# Patient Record
Sex: Female | Born: 1950 | Race: Black or African American | Hispanic: No | Marital: Single | Smoking: Former smoker
Health system: Southern US, Community
[De-identification: ages and names within clinical notes are randomized; demographics above are authoritative.]

## PROBLEM LIST (undated history)

## (undated) DIAGNOSIS — J189 Pneumonia, unspecified organism: Secondary | ICD-10-CM

## (undated) DIAGNOSIS — Z9119 Patient's noncompliance with other medical treatment and regimen: Secondary | ICD-10-CM

## (undated) DIAGNOSIS — T829XXA Unspecified complication of cardiac and vascular prosthetic device, implant and graft, initial encounter: Secondary | ICD-10-CM

## (undated) DIAGNOSIS — I10 Essential (primary) hypertension: Secondary | ICD-10-CM

## (undated) DIAGNOSIS — I5020 Unspecified systolic (congestive) heart failure: Secondary | ICD-10-CM

## (undated) DIAGNOSIS — Z91199 Patient's noncompliance with other medical treatment and regimen due to unspecified reason: Secondary | ICD-10-CM

## (undated) DIAGNOSIS — Z950 Presence of cardiac pacemaker: Secondary | ICD-10-CM

## (undated) DIAGNOSIS — Z9289 Personal history of other medical treatment: Secondary | ICD-10-CM

## (undated) DIAGNOSIS — J42 Unspecified chronic bronchitis: Secondary | ICD-10-CM

## (undated) DIAGNOSIS — Z992 Dependence on renal dialysis: Secondary | ICD-10-CM

## (undated) DIAGNOSIS — I8500 Esophageal varices without bleeding: Secondary | ICD-10-CM

## (undated) DIAGNOSIS — K861 Other chronic pancreatitis: Secondary | ICD-10-CM

## (undated) DIAGNOSIS — K7460 Unspecified cirrhosis of liver: Secondary | ICD-10-CM

## (undated) DIAGNOSIS — I2510 Atherosclerotic heart disease of native coronary artery without angina pectoris: Secondary | ICD-10-CM

## (undated) DIAGNOSIS — D509 Iron deficiency anemia, unspecified: Secondary | ICD-10-CM

## (undated) DIAGNOSIS — I255 Ischemic cardiomyopathy: Secondary | ICD-10-CM

## (undated) DIAGNOSIS — I509 Heart failure, unspecified: Secondary | ICD-10-CM

## (undated) DIAGNOSIS — N186 End stage renal disease: Secondary | ICD-10-CM

## (undated) DIAGNOSIS — I214 Non-ST elevation (NSTEMI) myocardial infarction: Secondary | ICD-10-CM

## (undated) DIAGNOSIS — Z95810 Presence of automatic (implantable) cardiac defibrillator: Secondary | ICD-10-CM

## (undated) HISTORY — DX: Ischemic cardiomyopathy: I25.5

## (undated) HISTORY — PX: PERITONEAL CATHETER REMOVAL: SUR1106

## (undated) HISTORY — PX: DILATION AND CURETTAGE OF UTERUS: SHX78

## (undated) HISTORY — PX: CARDIAC CATHETERIZATION: SHX172

## (undated) HISTORY — PX: APPENDECTOMY: SHX54

## (undated) HISTORY — DX: Atherosclerotic heart disease of native coronary artery without angina pectoris: I25.10

## (undated) HISTORY — DX: Patient's noncompliance with other medical treatment and regimen: Z91.19

## (undated) HISTORY — DX: Patient's noncompliance with other medical treatment and regimen due to unspecified reason: Z91.199

## (undated) HISTORY — DX: Essential (primary) hypertension: I10

## (undated) HISTORY — PX: VAGINAL HYSTERECTOMY: SUR661

## (undated) HISTORY — PX: INSERTION OF DIALYSIS CATHETER: SHX1324

---
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
## 1970-06-15 HISTORY — PX: TUBAL LIGATION: SHX77

## 2001-06-15 HISTORY — PX: CHOLECYSTECTOMY: SHX55

## 2001-08-19 ENCOUNTER — Inpatient Hospital Stay (HOSPITAL_COMMUNITY): Admission: EM | Admit: 2001-08-19 | Discharge: 2001-09-21 | Payer: Self-pay | Admitting: Emergency Medicine

## 2001-08-19 ENCOUNTER — Encounter: Payer: Self-pay | Admitting: Family Medicine

## 2001-08-20 ENCOUNTER — Encounter: Payer: Self-pay | Admitting: Family Medicine

## 2001-08-21 ENCOUNTER — Encounter: Payer: Self-pay | Admitting: Family Medicine

## 2001-08-22 ENCOUNTER — Encounter: Payer: Self-pay | Admitting: Family Medicine

## 2001-08-24 ENCOUNTER — Encounter: Payer: Self-pay | Admitting: Family Medicine

## 2001-08-25 ENCOUNTER — Encounter: Payer: Self-pay | Admitting: Family Medicine

## 2001-08-26 ENCOUNTER — Encounter: Payer: Self-pay | Admitting: Pulmonary Disease

## 2001-08-26 ENCOUNTER — Encounter: Payer: Self-pay | Admitting: Family Medicine

## 2001-08-27 ENCOUNTER — Encounter: Payer: Self-pay | Admitting: Family Medicine

## 2001-08-28 ENCOUNTER — Encounter: Payer: Self-pay | Admitting: Family Medicine

## 2001-08-29 ENCOUNTER — Encounter: Payer: Self-pay | Admitting: Family Medicine

## 2001-08-31 ENCOUNTER — Encounter: Payer: Self-pay | Admitting: Family Medicine

## 2001-09-01 ENCOUNTER — Encounter: Payer: Self-pay | Admitting: Family Medicine

## 2001-09-03 ENCOUNTER — Encounter: Payer: Self-pay | Admitting: Family Medicine

## 2001-09-04 ENCOUNTER — Encounter: Payer: Self-pay | Admitting: Family Medicine

## 2001-09-05 ENCOUNTER — Encounter: Payer: Self-pay | Admitting: Family Medicine

## 2001-09-06 ENCOUNTER — Encounter: Payer: Self-pay | Admitting: Family Medicine

## 2001-09-08 ENCOUNTER — Encounter: Payer: Self-pay | Admitting: Family Medicine

## 2001-09-09 ENCOUNTER — Encounter: Payer: Self-pay | Admitting: Family Medicine

## 2001-09-12 ENCOUNTER — Encounter: Payer: Self-pay | Admitting: Family Medicine

## 2001-09-13 ENCOUNTER — Encounter: Payer: Self-pay | Admitting: Family Medicine

## 2001-09-14 ENCOUNTER — Encounter: Payer: Self-pay | Admitting: Family Medicine

## 2001-09-18 ENCOUNTER — Encounter: Payer: Self-pay | Admitting: Family Medicine

## 2001-09-26 ENCOUNTER — Ambulatory Visit (HOSPITAL_COMMUNITY): Admission: RE | Admit: 2001-09-26 | Discharge: 2001-09-26 | Payer: Self-pay | Admitting: *Deleted

## 2001-09-26 ENCOUNTER — Encounter: Admission: RE | Admit: 2001-09-26 | Discharge: 2001-09-26 | Payer: Self-pay | Admitting: Family Medicine

## 2001-10-11 ENCOUNTER — Ambulatory Visit (HOSPITAL_COMMUNITY): Admission: RE | Admit: 2001-10-11 | Discharge: 2001-10-11 | Payer: Self-pay | Admitting: Family Medicine

## 2001-10-11 ENCOUNTER — Encounter: Admission: RE | Admit: 2001-10-11 | Discharge: 2001-10-11 | Payer: Self-pay | Admitting: Family Medicine

## 2001-10-12 ENCOUNTER — Ambulatory Visit (HOSPITAL_COMMUNITY): Admission: RE | Admit: 2001-10-12 | Discharge: 2001-10-12 | Payer: Self-pay | Admitting: *Deleted

## 2001-10-12 ENCOUNTER — Encounter: Admission: RE | Admit: 2001-10-12 | Discharge: 2001-10-12 | Payer: Self-pay | Admitting: Family Medicine

## 2001-10-13 ENCOUNTER — Encounter: Admission: RE | Admit: 2001-10-13 | Discharge: 2001-10-13 | Payer: Self-pay | Admitting: Family Medicine

## 2001-10-17 ENCOUNTER — Encounter: Admission: RE | Admit: 2001-10-17 | Discharge: 2001-10-17 | Payer: Self-pay | Admitting: Family Medicine

## 2001-10-25 ENCOUNTER — Encounter: Admission: RE | Admit: 2001-10-25 | Discharge: 2001-10-25 | Payer: Self-pay | Admitting: Sports Medicine

## 2001-10-27 ENCOUNTER — Inpatient Hospital Stay (HOSPITAL_COMMUNITY): Admission: AD | Admit: 2001-10-27 | Discharge: 2001-10-31 | Payer: Self-pay | Admitting: Cardiology

## 2001-11-04 ENCOUNTER — Encounter: Admission: RE | Admit: 2001-11-04 | Discharge: 2001-11-04 | Payer: Self-pay | Admitting: Family Medicine

## 2001-11-15 ENCOUNTER — Encounter: Payer: Self-pay | Admitting: Internal Medicine

## 2001-11-15 ENCOUNTER — Emergency Department (HOSPITAL_COMMUNITY): Admission: EM | Admit: 2001-11-15 | Discharge: 2001-11-15 | Payer: Self-pay | Admitting: Emergency Medicine

## 2001-11-21 ENCOUNTER — Inpatient Hospital Stay (HOSPITAL_COMMUNITY): Admission: RE | Admit: 2001-11-21 | Discharge: 2001-11-28 | Payer: Self-pay | Admitting: *Deleted

## 2001-11-21 ENCOUNTER — Encounter (INDEPENDENT_AMBULATORY_CARE_PROVIDER_SITE_OTHER): Payer: Self-pay | Admitting: *Deleted

## 2001-11-24 ENCOUNTER — Encounter: Payer: Self-pay | Admitting: Surgery

## 2001-12-13 ENCOUNTER — Inpatient Hospital Stay (HOSPITAL_COMMUNITY): Admission: EM | Admit: 2001-12-13 | Discharge: 2001-12-15 | Payer: Self-pay | Admitting: *Deleted

## 2001-12-19 ENCOUNTER — Ambulatory Visit (HOSPITAL_COMMUNITY): Admission: RE | Admit: 2001-12-19 | Discharge: 2001-12-19 | Payer: Self-pay | Admitting: *Deleted

## 2002-01-09 ENCOUNTER — Encounter: Admission: RE | Admit: 2002-01-09 | Discharge: 2002-01-09 | Payer: Self-pay | Admitting: Family Medicine

## 2002-02-07 ENCOUNTER — Encounter: Payer: Self-pay | Admitting: Emergency Medicine

## 2002-02-07 ENCOUNTER — Emergency Department (HOSPITAL_COMMUNITY): Admission: EM | Admit: 2002-02-07 | Discharge: 2002-02-07 | Payer: Self-pay | Admitting: Emergency Medicine

## 2002-06-26 ENCOUNTER — Encounter: Admission: RE | Admit: 2002-06-26 | Discharge: 2002-06-26 | Payer: Self-pay | Admitting: Family Medicine

## 2002-06-30 ENCOUNTER — Encounter: Admission: RE | Admit: 2002-06-30 | Discharge: 2002-06-30 | Payer: Self-pay | Admitting: Internal Medicine

## 2002-06-30 ENCOUNTER — Encounter: Payer: Self-pay | Admitting: Internal Medicine

## 2002-08-14 ENCOUNTER — Encounter (INDEPENDENT_AMBULATORY_CARE_PROVIDER_SITE_OTHER): Payer: Self-pay | Admitting: *Deleted

## 2002-08-29 ENCOUNTER — Encounter: Admission: RE | Admit: 2002-08-29 | Discharge: 2002-08-29 | Payer: Self-pay | Admitting: Family Medicine

## 2002-10-26 ENCOUNTER — Encounter: Admission: RE | Admit: 2002-10-26 | Discharge: 2002-10-26 | Payer: Self-pay | Admitting: Family Medicine

## 2003-07-02 ENCOUNTER — Emergency Department (HOSPITAL_COMMUNITY): Admission: EM | Admit: 2003-07-02 | Discharge: 2003-07-03 | Payer: Self-pay | Admitting: Emergency Medicine

## 2003-07-31 ENCOUNTER — Encounter: Admission: RE | Admit: 2003-07-31 | Discharge: 2003-07-31 | Payer: Self-pay | Admitting: Family Medicine

## 2003-08-06 ENCOUNTER — Ambulatory Visit (HOSPITAL_COMMUNITY): Admission: RE | Admit: 2003-08-06 | Discharge: 2003-08-06 | Payer: Self-pay | Admitting: Family Medicine

## 2004-08-04 ENCOUNTER — Ambulatory Visit: Payer: Self-pay | Admitting: Family Medicine

## 2004-09-02 ENCOUNTER — Ambulatory Visit: Payer: Self-pay | Admitting: Sports Medicine

## 2006-06-15 HISTORY — PX: CORONARY ANGIOPLASTY WITH STENT PLACEMENT: SHX49

## 2006-06-15 HISTORY — PX: CORONARY ARTERY BYPASS GRAFT: SHX141

## 2006-07-18 ENCOUNTER — Inpatient Hospital Stay (HOSPITAL_COMMUNITY): Admission: EM | Admit: 2006-07-18 | Discharge: 2006-08-01 | Payer: Self-pay | Admitting: Emergency Medicine

## 2006-07-23 ENCOUNTER — Ambulatory Visit: Payer: Self-pay | Admitting: Cardiothoracic Surgery

## 2006-07-23 ENCOUNTER — Encounter: Payer: Self-pay | Admitting: Vascular Surgery

## 2006-07-26 ENCOUNTER — Encounter (INDEPENDENT_AMBULATORY_CARE_PROVIDER_SITE_OTHER): Payer: Self-pay | Admitting: Specialist

## 2006-08-13 ENCOUNTER — Encounter (INDEPENDENT_AMBULATORY_CARE_PROVIDER_SITE_OTHER): Payer: Self-pay | Admitting: *Deleted

## 2006-08-20 ENCOUNTER — Ambulatory Visit: Payer: Self-pay | Admitting: Cardiothoracic Surgery

## 2006-09-05 ENCOUNTER — Emergency Department (HOSPITAL_COMMUNITY): Admission: EM | Admit: 2006-09-05 | Discharge: 2006-09-05 | Payer: Self-pay | Admitting: *Deleted

## 2006-09-10 ENCOUNTER — Ambulatory Visit: Payer: Self-pay | Admitting: Cardiothoracic Surgery

## 2006-11-04 ENCOUNTER — Inpatient Hospital Stay (HOSPITAL_COMMUNITY): Admission: EM | Admit: 2006-11-04 | Discharge: 2006-11-08 | Payer: Self-pay | Admitting: Emergency Medicine

## 2006-11-12 ENCOUNTER — Ambulatory Visit: Payer: Self-pay | Admitting: Cardiothoracic Surgery

## 2006-12-07 ENCOUNTER — Ambulatory Visit: Payer: Self-pay | Admitting: *Deleted

## 2006-12-07 ENCOUNTER — Inpatient Hospital Stay (HOSPITAL_COMMUNITY): Admission: EM | Admit: 2006-12-07 | Discharge: 2006-12-11 | Payer: Self-pay | Admitting: Emergency Medicine

## 2007-03-22 ENCOUNTER — Encounter (INDEPENDENT_AMBULATORY_CARE_PROVIDER_SITE_OTHER): Payer: Self-pay | Admitting: *Deleted

## 2007-04-29 ENCOUNTER — Encounter: Payer: Self-pay | Admitting: *Deleted

## 2009-07-16 ENCOUNTER — Emergency Department (HOSPITAL_COMMUNITY): Admission: EM | Admit: 2009-07-16 | Discharge: 2009-07-16 | Payer: Self-pay | Admitting: Emergency Medicine

## 2009-07-17 ENCOUNTER — Observation Stay (HOSPITAL_COMMUNITY): Admission: EM | Admit: 2009-07-17 | Discharge: 2009-07-19 | Payer: Self-pay | Admitting: Emergency Medicine

## 2009-07-17 ENCOUNTER — Ambulatory Visit: Payer: Self-pay | Admitting: Family Medicine

## 2009-07-18 ENCOUNTER — Encounter: Payer: Self-pay | Admitting: Family Medicine

## 2010-05-13 ENCOUNTER — Ambulatory Visit: Payer: Self-pay | Admitting: Oncology

## 2010-06-06 ENCOUNTER — Ambulatory Visit: Payer: Self-pay | Admitting: Oncology

## 2010-06-06 LAB — CBC WITH DIFFERENTIAL/PLATELET
Basophils Absolute: 0 10*3/uL (ref 0.0–0.1)
EOS%: 1.8 % (ref 0.0–7.0)
MONO#: 0.4 10*3/uL (ref 0.1–0.9)
MONO%: 6.4 % (ref 0.0–14.0)
NEUT#: 4.3 10*3/uL (ref 1.5–6.5)
NEUT%: 74.9 % (ref 38.4–76.8)
RBC: 3.42 10*6/uL — ABNORMAL LOW (ref 3.70–5.45)
RDW: 17.2 % — ABNORMAL HIGH (ref 11.2–14.5)

## 2010-06-11 LAB — COMPREHENSIVE METABOLIC PANEL
ALT: <8 U/L (ref 0–35)
AST: 9 U/L (ref 0–37)
Alkaline Phosphatase: 58 U/L (ref 39–117)
CO2: 16 mEq/L — ABNORMAL LOW (ref 19–32)
Calcium: 8.8 mg/dL (ref 8.4–10.5)
Creatinine, Ser: 4.61 mg/dL — ABNORMAL HIGH (ref 0.40–1.20)
Potassium: 4.4 mEq/L (ref 3.5–5.3)
Sodium: 136 mEq/L (ref 135–145)
Total Bilirubin: 0.3 mg/dL (ref 0.3–1.2)
Total Protein: 6.5 g/dL (ref 6.0–8.3)

## 2010-06-11 LAB — FERRITIN: Ferritin: 772 ng/mL — ABNORMAL HIGH (ref 10–291)

## 2010-06-11 LAB — VITAMIN B12: Vitamin B-12: 572 pg/mL (ref 211–911)

## 2010-06-11 LAB — IRON AND TIBC: TIBC: 301 ug/dL (ref 250–470)

## 2010-06-11 LAB — PROTEIN ELECTROPHORESIS, SERUM
Alpha-1-Globulin: 6.4 % — ABNORMAL HIGH (ref 2.9–4.9)
Alpha-2-Globulin: 10.1 % (ref 7.1–11.8)
Beta 2: 6.4 % (ref 3.2–6.5)
Gamma Globulin: 17.1 % (ref 11.1–18.8)

## 2010-06-11 LAB — ERYTHROPOIETIN: Erythropoietin: 10.6 m[IU]/mL (ref 2.6–34.0)

## 2010-06-11 LAB — FOLATE: Folate: 8.5 ng/mL

## 2010-08-01 ENCOUNTER — Other Ambulatory Visit: Payer: Self-pay | Admitting: Oncology

## 2010-08-01 ENCOUNTER — Encounter (HOSPITAL_BASED_OUTPATIENT_CLINIC_OR_DEPARTMENT_OTHER): Payer: Non-veteran care | Admitting: Oncology

## 2010-08-01 DIAGNOSIS — D539 Nutritional anemia, unspecified: Secondary | ICD-10-CM

## 2010-08-01 LAB — CBC WITH DIFFERENTIAL/PLATELET
Basophils Absolute: 0 10*3/uL (ref 0.0–0.1)
EOS%: 2.9 % (ref 0.0–7.0)
HCT: 24.2 % — ABNORMAL LOW (ref 34.8–46.6)
HGB: 8.3 g/dL — ABNORMAL LOW (ref 11.6–15.9)
LYMPH%: 22 % (ref 14.0–49.7)
MCH: 28.1 pg (ref 25.1–34.0)
MONO#: 0.5 10*3/uL (ref 0.1–0.9)
MONO%: 10.4 % (ref 0.0–14.0)
NEUT#: 3.3 10*3/uL (ref 1.5–6.5)
Platelets: 239 10*3/uL (ref 145–400)
RBC: 2.96 10*6/uL — ABNORMAL LOW (ref 3.70–5.45)
lymph#: 1.1 10*3/uL (ref 0.9–3.3)

## 2010-09-03 LAB — GLUCOSE, CAPILLARY
Glucose-Capillary: 110 mg/dL — ABNORMAL HIGH (ref 70–99)
Glucose-Capillary: 116 mg/dL — ABNORMAL HIGH (ref 70–99)
Glucose-Capillary: 138 mg/dL — ABNORMAL HIGH (ref 70–99)
Glucose-Capillary: 154 mg/dL — ABNORMAL HIGH (ref 70–99)
Glucose-Capillary: 162 mg/dL — ABNORMAL HIGH (ref 70–99)
Glucose-Capillary: 57 mg/dL — ABNORMAL LOW (ref 70–99)
Glucose-Capillary: 63 mg/dL — ABNORMAL LOW (ref 70–99)
Glucose-Capillary: 66 mg/dL — ABNORMAL LOW (ref 70–99)
Glucose-Capillary: 93 mg/dL (ref 70–99)

## 2010-09-03 LAB — DIFFERENTIAL
Basophils Relative: 0 % (ref 0–1)
Eosinophils Relative: 1 % (ref 0–5)
Monocytes Absolute: 0.2 10*3/uL (ref 0.1–1.0)
Neutro Abs: 7.5 10*3/uL (ref 1.7–7.7)
Neutrophils Relative %: 87 % — ABNORMAL HIGH (ref 43–77)

## 2010-09-03 LAB — POCT I-STAT, CHEM 8
BUN: 64 mg/dL — ABNORMAL HIGH (ref 6–23)
Calcium, Ion: 1.12 mmol/L (ref 1.12–1.32)
Creatinine, Ser: 3.9 mg/dL — ABNORMAL HIGH (ref 0.4–1.2)
Glucose, Bld: 176 mg/dL — ABNORMAL HIGH (ref 70–99)
TCO2: 23 mmol/L (ref 0–100)

## 2010-09-03 LAB — COMPREHENSIVE METABOLIC PANEL
ALT: 13 U/L (ref 0–35)
ALT: 13 U/L (ref 0–35)
AST: 14 U/L (ref 0–37)
Albumin: 3.2 g/dL — ABNORMAL LOW (ref 3.5–5.2)
CO2: 23 mEq/L (ref 19–32)
Calcium: 8.9 mg/dL (ref 8.4–10.5)
Calcium: 9.2 mg/dL (ref 8.4–10.5)
Creatinine, Ser: 3.38 mg/dL — ABNORMAL HIGH (ref 0.4–1.2)
GFR calc Af Amer: 17 mL/min — ABNORMAL LOW (ref >60)
GFR calc non Af Amer: 14 mL/min — ABNORMAL LOW (ref >60)
Glucose, Bld: 103 mg/dL — ABNORMAL HIGH (ref 70–99)
Glucose, Bld: 41 mg/dL — CL (ref 70–99)
Sodium: 141 mEq/L (ref 135–145)
Total Protein: 7.4 g/dL (ref 6.0–8.3)

## 2010-09-03 LAB — LIPID PANEL
Cholesterol: 165 mg/dL (ref 0–200)
HDL: 77 mg/dL (ref >39)
Triglycerides: 156 mg/dL — ABNORMAL HIGH (ref <150)

## 2010-09-03 LAB — CBC
HCT: 24.2 % — ABNORMAL LOW (ref 36.0–46.0)
Hemoglobin: 7.2 g/dL — ABNORMAL LOW (ref 12.0–15.0)
Hemoglobin: 7.4 g/dL — ABNORMAL LOW (ref 12.0–15.0)
MCHC: 32.7 g/dL (ref 30.0–36.0)
MCHC: 33.1 g/dL (ref 30.0–36.0)
MCHC: 33.7 g/dL (ref 30.0–36.0)
MCV: 81 fL (ref 78.0–100.0)
Platelets: 222 10*3/uL (ref 150–400)
Platelets: 240 10*3/uL (ref 150–400)
RBC: 2.65 MIL/uL — ABNORMAL LOW (ref 3.87–5.11)
RBC: 2.74 MIL/uL — ABNORMAL LOW (ref 3.87–5.11)
RDW: 18.5 % — ABNORMAL HIGH (ref 11.5–15.5)
WBC: 5.7 10*3/uL (ref 4.0–10.5)
WBC: 7 10*3/uL (ref 4.0–10.5)

## 2010-09-03 LAB — URINE MICROSCOPIC-ADD ON

## 2010-09-03 LAB — TROPONIN I: Troponin I: 0.04 ng/mL (ref 0.00–0.06)

## 2010-09-03 LAB — URINALYSIS, ROUTINE W REFLEX MICROSCOPIC
Bilirubin Urine: NEGATIVE
Leukocytes, UA: NEGATIVE
Nitrite: NEGATIVE
Protein, ur: >300 mg/dL — AB
Urobilinogen, UA: 0.2 mg/dL (ref 0.0–1.0)

## 2010-09-03 LAB — RENAL FUNCTION PANEL
Albumin: 3.1 g/dL — ABNORMAL LOW (ref 3.5–5.2)
BUN: 51 mg/dL — ABNORMAL HIGH (ref 6–23)
Chloride: 106 mEq/L (ref 96–112)
GFR calc Af Amer: 16 mL/min — ABNORMAL LOW (ref >60)
GFR calc non Af Amer: 14 mL/min — ABNORMAL LOW (ref >60)
Phosphorus: 4.6 mg/dL (ref 2.3–4.6)
Potassium: 4.4 mEq/L (ref 3.5–5.1)
Sodium: 136 mEq/L (ref 135–145)

## 2010-09-03 LAB — CARDIAC PANEL(CRET KIN+CKTOT+MB+TROPI)
CK, MB: 3.7 ng/mL (ref 0.3–4.0)
Relative Index: INVALID (ref 0.0–2.5)
Troponin I: 0.04 ng/mL (ref 0.00–0.06)

## 2010-09-03 LAB — CK TOTAL AND CKMB (NOT AT ARMC): Total CK: 118 U/L (ref 7–177)

## 2010-09-03 LAB — HEMOGLOBIN A1C: Hgb A1c MFr Bld: 6.2 % — ABNORMAL HIGH (ref 4.6–6.1)

## 2010-09-03 LAB — IRON AND TIBC
Iron: 32 ug/dL — ABNORMAL LOW (ref 42–135)
TIBC: 279 ug/dL (ref 250–470)

## 2010-10-04 ENCOUNTER — Emergency Department (HOSPITAL_COMMUNITY)
Admission: EM | Admit: 2010-10-04 | Discharge: 2010-10-04 | Disposition: A | Discharge status: Home / Self Care | Payer: Medicare Other | Attending: Emergency Medicine | Admitting: Emergency Medicine

## 2010-10-04 ENCOUNTER — Emergency Department (HOSPITAL_COMMUNITY): Payer: Medicare Other

## 2010-10-04 DIAGNOSIS — Z7982 Long term (current) use of aspirin: Secondary | ICD-10-CM | POA: Insufficient documentation

## 2010-10-04 DIAGNOSIS — R0602 Shortness of breath: Secondary | ICD-10-CM | POA: Insufficient documentation

## 2010-10-04 DIAGNOSIS — I2510 Atherosclerotic heart disease of native coronary artery without angina pectoris: Secondary | ICD-10-CM | POA: Insufficient documentation

## 2010-10-04 DIAGNOSIS — Z79899 Other long term (current) drug therapy: Secondary | ICD-10-CM | POA: Insufficient documentation

## 2010-10-04 DIAGNOSIS — I509 Heart failure, unspecified: Secondary | ICD-10-CM | POA: Insufficient documentation

## 2010-10-04 DIAGNOSIS — I10 Essential (primary) hypertension: Secondary | ICD-10-CM | POA: Insufficient documentation

## 2010-10-04 DIAGNOSIS — E119 Type 2 diabetes mellitus without complications: Secondary | ICD-10-CM | POA: Insufficient documentation

## 2010-10-04 DIAGNOSIS — Z951 Presence of aortocoronary bypass graft: Secondary | ICD-10-CM | POA: Insufficient documentation

## 2010-10-28 NOTE — Discharge Summary (Signed)
[name], [name]               ACCOUNT NO.:  [id number]   MEDICAL RECORD NO.:  [id number]          PATIENT TYPE:  INP   LOCATION:  6531                         FACILITY:  [facility]   PHYSICIAN:  Armanda Magic, M.D.     DATE OF BIRTH:  [birth year]   DATE OF ADMISSION:  12/07/2006  DATE OF DISCHARGE:  12/11/2006                               DISCHARGE SUMMARY   DISCHARGE DIAGNOSES:  1. Coronary artery disease, status post drug-eluting stent to the      proximal and mid left anterior descending on December 10, 2006, by Dr.      Corliss Marcus.  2. Known coronary artery disease, history of recent coronary artery      bypass grafting.  3. Hypertension, stable.  4. Ischemic cardiomyopathy, ejection fraction 15%.  5. Diabetes mellitus.  6. History of pancreatitis.  7. Former smoker.  8. Allergies to CODEINE AND MORPHINE.   HOSPITAL COURSE:  [name] is a 60 year old female who was admitted to  [facility] December 07, 2006, with chest pain.  On the day prior to  admission she apparently had a stress Myoview at the [place], and records  were faxed showing two large areas of reversible ischemia in the left  ventricle, which is dilated.   The patient ended up coming to [facility] for angina the next day.  The patient has had CABG in the past and has undergone the following  grafts:  LIMA to LAD, saphenous vein graft to the diagonal, saphenous  vein graft to PDA, saphenous vein graft to the circumflex; this occurred  in February of 2008.   Because of her documented positive Cardiolite and known coronary artery  disease, the patient was taken to the cardiac catheterization lab on  December 09, 2006, and was found to have the following:  LAD with two  sequential 80% mid lesions.  There was a proximal 80-90% circumflex  lesion and a 70% mid right coronary artery lesion.  Grafts are as  follows:  LIMA to the LAD has an anastomotic lesion; saphenous vein  graft to the PDA and saphenous vein graft to the  OM2, patent; saphenous  vein graft to the diagonal has a high-grade anastomotic lesion.  Ejection fraction was documented at 15%.   The patient was then seen in consultation by Dr. Corliss Marcus, and he  performed drug-eluting stent deployment to the proximal and mid LAD  lesions, resulting in 0% stenosis post procedure.  The procedure did  produce the patient's typical angina.  The patient was angioseal with  good result.   LAB STUDIES:  Lab studies during the hospital stay include white count  of 7.2, hemoglobin 10.0, hematocrit 30.5, platelets 255, PT 15.4, INR  1.2, sodium 141, potassium 4.2, BUN 38, creatinine 1.5, LFTs essentially  normal, CK-MB and troponin negative x2, TSH 2.125, digoxin 0.6.  EKG:  Normal sinus rhythm, rate 82 with T-wave inversions laterally with LVH.   DISCHARGE MEDICATIONS:  The patient's discharge medications include:  1. Enteric-coated aspirin 325 mg a day.  2. Coreg 12.5 mg p.o. b.i.d.  3. Lisinopril 40  mg daily.  4. Digoxin 0.125 mg a day.  5. Potassium 20 mEq, one tablet daily.  6. Hydralazine 50 mg q.6h.  7. Zocor 40 mg a day.  8. Glipizide 10 mg twice a day.  9. Imdur 60 mg daily.  10.Lasix 20 mg a day.  11.Insulin as prior to admission.  12.Sublingual nitroglycerin p.r.n. chest pain.   The patient is not to resume BiDil.  Remain on a low-sodium, heart-  healthy diabetic diet.  Increase activity slowly.  No lifting over ten  pounds for one week.  No driving for two days.  Continue heart failure  instructions/weight charts, as this information was provided to the  patient.  Clean cath site gently with soap and water.  Follow up with  Dr. Norris Cross office on Monday, December 13, 2006, for blood pressure check  and lab work.  The patient is not to eat 8 hours before the test so that  we can check a C-met and a lipid profile.  And then the patient is to  follow up with Dr. Mayford Knife on December 16, 2006, at 10:45 a.m.  At this point  we will need to see how  the patient is doing and discuss defibrillator  implantation.      Guy Franco, P.A.      Armanda Magic, M.D.  Electronically Signed    LB/MEDQ  D:  12/10/2006  T:  12/11/2006  Job:  [id number]   cc:   Armanda Magic, M.D.

## 2010-10-28 NOTE — Cardiovascular Report (Signed)
NAMEMAHIRA, [name]               ACCOUNT NO.:  [id number]   MEDICAL RECORD NO.:  [id number]          PATIENT TYPE:  INP   LOCATION:  6531                         FACILITY:  [facility]   PHYSICIAN:  Armanda Magic, M.D.     DATE OF BIRTH:  07-Oct-1950   DATE OF PROCEDURE:  12/09/2006  DATE OF DISCHARGE:  12/11/2006                            CARDIAC CATHETERIZATION   PROCEDURE:  Left heart catheterization, coronary angiography, LIMA  angiography, saphenous vein graft angiography.   OPERATOR:  Armanda Magic, M.D.   INDICATIONS:  Chest pain, abnormal Cardiolite, with anterior,  anterolateral, and inferolateral ischemia.   COMPLICATIONS:  None.   IV ACCESS:  Via right femoral artery 6-French sheath.   IV MEDICATIONS:  1. IV nitroglycerin drip.  2. Lopressor 7.5 mg IV.  3. Versed 2 mg IV.   This is a 60 year old lady with a history of coronary disease, status  post coronary bypass grafting, who presents now with chest pain and  abnormal Cardiolite.  She now presents for cardiac catheterization.   The patient was brought to cardiac catheterization laboratory in the  fasting nonsedated state.  Informed consent was obtained.  The patient  was connected to continuous heart rate and pulse oximetry monitoring and  intermittent blood pressure monitoring.  The right groin was prepped and  draped in a sterile fashion.  One percent Xylocaine was used for local  anesthesia.  Using a modified Seldinger technique, a 6-French sheath was  placed in the right femoral artery.  Under fluoroscopic guidance, a 6-  [place] JL-4 catheter was placed in the left coronary artery.  Multiple  cine films were taken at 30-degree RAO and 40-degree LAO views.  This  catheter was exchanged out over a guidewire for a 6-French JR-4  catheter, which was placed under fluoroscopic guidance into right  coronary artery.  Multiple cine films were taken at 30-degree RAO and 40-  degree LAO views.  The catheter was pulled back  the ostium of the RCA  and advanced into the saphenous vein graft to the OM2.  Multiple cine  films taken at 30-degree RAO and 40-degree LAO views.  This catheter was  then pulled back into the saphenous vein graft to the diagonal, and  multiple cine films were taken at 30-degree RAO and 40-degree LAO views.  This catheter was then advanced in the left internal mammary artery  ostium, and multiple cine films were taken at 30-degree RAO and 40-  degree LAO views.  This catheter was then pulled back and advanced into  the saphenous vein graft to the PDA, and multiple cine films were taken  at 30-degree RAO and 40-degree LAO views.  This catheter was then  exchanged out over a guidewire for a 6-French angled pigtail catheter,  which was placed under fluoroscopic guidance into the left ventricular  cavity.  Left ventricular pressure was measured.  Left ventriculography  was not performed secondary to markedly elevated LVEDP.  The catheter  was then pulled back across the aortic valve, with no significant  gradient noted.  At the end of the procedure, all catheters and  sheaths  were removed.  Manual compression was performed until adequate  hemostasis was obtained.  The patient was transferred back to her room  in stable condition.   RESULTS:  1. The left main coronary artery is widely patent and trifurcates into      the left anterior descending artery, left circumflex artery, and      ramus branch.  The left anterior descending artery gives rise to a      diagonal branch which has competitive flow into a saphenous vein      graft to the diagonal.  Just after the takeoff of the diagonal      branch, there are two sequential 80% stenoses of the mid-LAD.      There is evidence of some slight competitive flow in the LIMA to      the LAD graft.  The ongoing LAD is widely patent.  2. The ramus is widely patent.  3. The left circumflex is widely patent.  It gives rise to a small      obtuse  marginal branch.  Just distal to the takeoff of that, there      is an 80%-9% stenosis of the left circumflex.  The left circumflex      then gives rise to a large second obtuse marginal branch which      bifurcates into two daughter branches.  There is evidence of      competitive flow into the superior branch of the OM2.  The ongoing      left circumflex is widely patent, giving rise to a third obtuse      marginal branch which is widely patent.  4. The right coronary artery has a proximal 70% stenosis and then      bifurcates distally into a posterior descending artery posterior      lateral artery.  There is evidence of competitive flow in the      posterior descending artery.  5. The saphenous vein graft and posterior descending artery is widely      patent.  The saphenous vein graft to the diagonal has an 80%      anastomotic lesion.  This vein graft to the OM2 is widely patent,      and the LIMA to the LAD has a 90% stenosis at the anastomosis to      the LAD.  6. Left ventricular pressure 175/33mmHg, aortic pressure 179/109 mmHg,      LVEDP 29 mmHg.   ASSESSMENT:  1. Unstable angina.  2. Severe three-vessel coronary artery disease.  Saphenous vein graft      to the diagonal has a high-grade anastomotic lesion.  This      saphenous vein graft to the OM2 is patent.  Saphenous vein graft to      the PDA is patent.  LIMA to the LAD also has a high-grade      anastomotic lesion.  3. Severe ischemic cardiomyopathy by echocardiogram and stress      Cardiolite study, EF 15%.  4. Poorly controlled hypertension.  5. Insulin-dependent diabetes mellitus.   PLAN:  Aggressive blood pressure management.  Discontinued Bidyl, and  increase hydralazine to 75 mg q.i.d., and increase Imdur as needed.  Continue Coreg, lisinopril, and Lanoxin, as well as aspirin.  Films were  reviewed with Dr. Amil Amen, who recommended PCI of the native LAD.  He  felt that the anastomotic lesion in the LIMA to  the LAD and also in the  saphenous  vein graft the diagonal were not amenable by PCI, and  therefore the patient will undergo PCI of the LAD tomorrow, December 10, 2006, by Dr. Amil Amen.  We will continue aspirin and Plavix, as well as  continue her Coreg, ACE inhibitor Imdur, hydralazine, and Lasix.  Ultimately, she will need an AICD +/- biventricular pacemaker, and I  will have her set up to see Dr. Amil Amen as an outpatient for workup of  this.      Armanda Magic, M.D.  Electronically Signed     TT/MEDQ  D:  01/14/2007  T:  01/15/2007  Job:  [id number]

## 2010-10-28 NOTE — H&P (Signed)
NAMESIYONA, COTO NO.:  [id number]   MEDICAL RECORD NO.:  [id number]           PATIENT TYPE:   LOCATION:                                 FACILITY:   PHYSICIAN:  Ulyses Amor, MD      DATE OF BIRTH:   DATE OF ADMISSION:  11/04/2006  DATE OF DISCHARGE:                              HISTORY & PHYSICAL   HISTORY:  [name] is a 60 year old black woman who is admitted to  [facility] for an exacerbation of congestive heart failure.   The patient is known to have an ischemic cardiomyopathy.  She has an  ejection fraction of 25%-30%.  She has previously suffered a myocardial  infarction.  Three months ago, she underwent coronary artery bypass  graft surgery for severe three vessel disease.  She received a left  internal mammary artery graft to the left anterior descending, saphenous  vein graft to the diagonal, saphenous vein graft to the posterior  descending artery and a saphenous vein graft to the circumflex.   She presented to the emergency department with a 1 week history of  progressive dyspnea.  This has included orthopnea and pedal edema.  Tonight, it progressed to the point where she was markedly dyspneic at  rest.  She was transported to the emergency department.  Initial  treatment with nitroglycerin, IV furosemide, and oxygen has resulted in  marked improvement in her symptoms.  She reports no chest pain,  tightness, heaviness, pressure or squeezing in association with the  dyspnea.   The patient has been taking her medications as prescribed.  These  include:   MEDICATIONS:  1. Aspirin 81 mg p.o. daily.  2. Coreg 6.25 mg p.o. 2 times a day.  3. Lisinopril 30 mg p.o. daily.  4. Zocor 20 mg p.o. daily.  5. Digoxin 0.125 mg p.o. daily.  6. Glucotrol 10 mg p.o. daily.  7. Prilosec 20 mg p.o. daily.  8. Furosemide.  9. Insulin.   ALLERGIES:  1. CODEINE.  2. MORPHINE.   The patient has a number of risk factor for coronary  artery disease  including hypertension, dyslipidemia, insulin-dependent diabetes  mellitus, and family history.  She does not smoke cigarettes.   PAST MEDICAL HISTORY:  Otherwise unremarkable.   OPERATIONS:  Coronary artery bypass graft surgery.   FAMILY HISTORY:  Notable for coronary artery disease.   SOCIAL HISTORY:  The patient lives alone.  She does not smoke  cigarettes.  She does not use alcohol.   REVIEW OF SYSTEMS:  Reveals no new problems related to her head, eyes,  ears, nose, mouth, throat, lungs, gastrointestinal system, genitourinary  system, or extremities.  There is no history of neurological/psychiatric  disorder.  There is no history of fever, chills or weight loss.   PHYSICAL EXAMINATION:  VITAL SIGNS: Blood pressure 165/105, pulse 100,  respirations 22, temperature 97.8, pulse oximeter 97% on two liters.  GENERAL: The patient was a middle aged, black woman in no discomfort.  (She had already been treated at this point.)  She was alert, oriented,  appropriate, and responsive.  HEAD, EYES, NOSE, AND MOUTH: Were normal.  NECK: Without thyromegaly or adenopathy. Carotid pulses were palpable  bilateral with no bruits.  CARDIAC EXAMINATION: Revealed a normal S1 and S2. There was no S3, S4,  murmur, rub, or click.  Cardiac rhythm is regular. No chest wall  tenderness was noted.  LUNGS: Clear.  ABDOMEN: Soft and nontender. There was no mass, hepatosplenomegaly,  bruit, distention, rebound, guarding or rigidity. The bowel sounds were  normal.  BREASTS, PELVIC AND RECTAL EXAMINATIONS: Were not performed as they were  not pertinent to the reason for acute care hospitalization.  EXTREMITIES: Without edema, deviation or deformity. Radial and dorsalis  pedis pulses were palpable bilaterally.  NEUROLOGIC EXAMINATION: Brief screening of neurologic survey was  unremarkable.   LABORATORY DATA:  The electrocardiogram reveals sinus tachycardia.  There was evidence of a prior  anterior myocardial infarction.  There  were no repolarization abnormalities specific for ischemia or  infarction.  The chest radiograph, according to the radiologist,  demonstrated cardiomegaly with pulmonary venous hypertension and mild  edema.  The initial set of cardiac markers revealed a myoglobin of 46.1,  CK-MB of 1.6 and troponin 0.07. BNP was 1,779.  Potassium was 4.3, BUN  26 and creatinine pending.  The white blood cell count was 8500 with a  hemoglobin of 11.1 and a hematocrit of 34.2. The remaining laboratory  studies were pending at the time of this dictation.   IMPRESSION:  1. Congestive heart failure exacerbation.  2. Ischemic cardiomyopathy.  Ejection fraction 25%-30%.  3. Coronary artery disease.  Three months status post coronary artery      bypass surgery with a left internal mammary artery graft to the      left anterior descending, saphenous vein graft to the diagonal, a      saphenous vein graft to the posterior descending artery and a      saphenous vein graft to the circumflex.  4. Hypertension.  5. Dyslipidemia.  6. Insulin-dependent diabetes mellitus.   PLAN:  1. Telemetry.  2. Serial cardiac enzymes.  3. Aspirin.  4. Lovenox.  5. Intravenous nitroglycerin.  6. Diuresis.  7. Oxygen.  8. Daily weights.  Intake and output.  9. Echocardiogram to check postoperative left ventricular function.  10.Further measures per Dr. Mayford Knife.      Ulyses Amor, MD  Electronically Signed     MSC/MEDQ  D:  11/04/2006  T:  11/04/2006  Job:  [id number]   cc:   Armanda Magic, M.D.

## 2010-10-28 NOTE — H&P (Signed)
[name], [name]:  [id number]   MEDICAL RECORD NO.:  [id number]          PATIENT TYPE:  EMS   LOCATION:  MAJO                         FACILITY:  [facility]   PHYSICIAN:  Unice Cobble, MD     DATE OF BIRTH:  08-06-1950   DATE OF ADMISSION:  12/07/2006  DATE OF DISCHARGE:                              HISTORY & PHYSICAL   PRIMARY CARDIOLOGIST:  Armanda Magic, M.D.   CHIEF COMPLAINT:  Right arm pain/anginal equivalent.   HISTORY OF PRESENT ILLNESS:  This is a 60 year old African-American  female with a history of insulin-dependent diabetes mellitus,  hypertension, dyslipidemia, who had a four-vessel bypass surgery in  February of this year and an admission in late May for ischemic  cardiomyopathy and CHF exacerbation (secondary to hypertensive urgency)  who is being admitted with right arm pain.  The patient has had some  right arm pain for some time.  This was her anginal equivalent which  resulted in her four-vessel bypass surgery, but it actually returned  three weeks after her bypass surgery and has come and gone ever since.  It is associated with diaphoresis but no shortness of breath,  palpitations, or presyncope.  It is hard to determine whether this is  exertional or not, as the patient has not walked more than two blocks or  up any stairs since her surgery.  She denies orthopnea, PND, or edema at  present.  She had a stress Myoview done yesterday at the [place], which was  positive (records to be faxed to Dr. Mayford Knife).  She has been asked to  come to the hospital for admission for possible catheterization in the  morning.   PAST MEDICAL HISTORY:  1. Congestive heart failure/ischemic cardiomyopathy with an ejection      fraction of 15%, last noted on Nov 04, 2006.  The same echo showed      mild MR and had left ventricular dilatation.  2. History of MI.  3. Coronary artery disease/CABG on July 26, 2006 with a LIMA to      the LAD, saphenous vein  graft to the diagonal, saphenous vein graft      to the PDA, saphenous vein graft to the circumflex.  4. Hypertension.  5. Dyslipidemia.  6. Insulin-dependent diabetes mellitus.  7. History of pancreatitis.   ALLERGIES:  CODEINE, MORPHINE.   MEDICATIONS:  1. Bidil 37.5/20 mg b.i.d.  2. Coreg 25 mg b.i.d.  3. Lasix 40 mg daily.  4. Lisinopril 40 mg daily.  5. Zocor 20 mg daily.  6. Lanoxin 0.125 mg daily.  7. Potassium chloride 20 mEq daily.  8. Glucotrol 10 mg daily.  9. Aspirin 81 mg daily.  10.Nitroglycerin as needed.  11.Imdur 30 mg daily.  12.NPH Insulin 15 units nightly.   SOCIAL HISTORY:  She lives in [place] alone.  She does not smoke,  drink, or use drugs.  She quit smoking seven years ago.   FAMILY HISTORY:  Her mother died at the age of 54 of congestive heart  failure, diabetes, and hypertension.  Her father died at the age  of 42  violently.   REVIEW OF SYSTEMS:  A complete review of systems was done and found to  be otherwise negative except as stated in the HPI.   PHYSICAL EXAMINATION:  VITAL SIGNS:  Temperature 98.1 with a pulse of  58, respiratory rate 18, blood pressure 140/83.  O2 of 98% on 2 liters.  GENERAL:  This is a mildly overweight African-American female in no  acute distress.  HEENT:  PERRLA.  EOMI.  MMM.  NECK:  Supple without lymphadenopathy, thyromegaly, bruits, or jugular  venous distention.  HEART:  Regular rate and rhythm with a normal S1 and S2.  She has a 2/6  systolic murmur best heard at the left lower sternal border radiating to  the axilla.  LUNGS: Clear to auscultation bilaterally.  ABDOMEN:  Soft and nontender without rebound or guarding.  EXTREMITIES:  No clubbing, cyanosis or edema.  NEUROLOGIC:  She is alert and oriented x3 with cranial nerves II-XII  grossly intact.  Strength is 5/5 in all extremities.   EKG shows a rate of 59, in normal sinus rhythm.  She has left axis  deviation to -43 degrees.  She has left ventricular  hypertrophy with  some QRS widening to 117.  Otherwise, no ST changes or ischemic T waves.   Her laboratory values are within normal limits with the exception of a  creatinine of 1.25, glucose 164, and a BNP of 520.  Troponins have not  yet been evaluated.   ASSESSMENT/PLAN:  This is a 60 year old African-American female with  diabetes, hypertension, dyslipidemia, and four-vessel CABG in February  with ischemic cardiomyopathy and an ejection fraction of 15% with  continuing anginal equivalents and a positive stress Myoview from the  [facility].  She is currently on a nitroglycerin with  moderate relief.  I will admit her for probable cath in the a.m.  Since  this can be viewed as unstable angina, I will anticoagulate her with  Lovenox and continue with home therapy otherwise.  Her nitroglycerin  drip will be continued.  She will be made n.p.o. past midnight for a  possible catheterization in the a.m.  MI will be ruled out.  She will  receive N-acetylcysteine since she is a diabetic and may receive a  catheterization tomorrow.  I will withhold fluid administration for  renal prophylaxis at this time secondary to her low ejection fraction.      Unice Cobble, MD  Electronically Signed     ACJ/MEDQ  D:  12/07/2006  T:  12/07/2006  Job:  [id number]

## 2010-10-28 NOTE — Assessment & Plan Note (Signed)
OFFICE VISIT   [name], [name]  DOB:  1950-07-27                                        Nov 12, 2006  CHART #:  [id number]   CURRENT PROBLEMS:  1. Status post CABG x4, February 2008 for class 4 unstable angina and      three vessel disease.  2. Type-2 diabetes mellitus.  3. Severe musculoskeletal sternal pain, tenderness, and hyperesthesia.  4. Hypertension.  5. History of CHF with an EF of 25%.   PRESENT ILLNESS:  The patient presents complaining of severe  musculoskeletal pain, tenderness, and hypersensitivity of the entire  precordium. She states that she was recently hospitalized with some  fluid retention, but had no evidence of myocardial infarction. She  states that she had an echo which showed her LV function to be baseline  with an EF of 25%. She now wishes assistance with severe  hypersensitivity, pain, and tenderness of her precordium.   CURRENT MEDICATIONS:  Her last record of medications include:  1. Glucotrol 10 mg.  2. Lantus insulin.  3. Zocor.  4. Lisinopril.  5. Coreg.  6. Digoxin.  7. Aspirin.   She has been taking pain medication also.   PHYSICAL EXAMINATION:  Blood pressure 140/90, pulse 80, respiration 18,  saturation 98%. Breath sounds are clear and equal. Cardiac rhythm is  regular without rub, gallop, or murmur. The sternum is stable and well  healed. She has exquisite tenderness to touching or any pressure over  the precordium. Her extremities are without edema or tenderness and the  leg incisions look fine.   Recent chest x-ray shows the sternal wires to be well-aligned, no plural  effusion or pulmonary infiltrates.   IMPRESSION AND PLAN:  The patient has severe musculoskeletal pain now  almost four months post-op. I am not concerned over infection or  pericarditis, but feel that she has probable post-cardiotomy or neuritic  type of pain. I gave her some Ultram to take as needed and we will have  her evaluated by  the pain specialist for possible injections or other  topical therapy. She will return as needed.   Kerin Perna, M.D.  Electronically Signed   PV/MEDQ  D:  11/12/2006  T:  11/13/2006  Job:  [id number]   cc:   Armanda Magic, M.D.

## 2010-10-28 NOTE — Discharge Summary (Signed)
[name], [name]               ACCOUNT NO.:  [id number]   MEDICAL RECORD NO.:  [id number]          PATIENT TYPE:  INP   LOCATION:  3714                         FACILITY:  [facility]   PHYSICIAN:  Armanda Magic, M.D.     DATE OF BIRTH:  1951/03/11   DATE OF ADMISSION:  11/04/2006  DATE OF DISCHARGE:  11/08/2006                               DISCHARGE SUMMARY   DISCHARGE DIAGNOSES:  1. Cardiomyopathy, multifactorial, acute heart failure, both systolic      and diastolic, resolved.  2. Known coronary artery disease, history of CABG with the following      grafts:  LIMA to LAD, saphenous vein graft to diagonal, saphenous      vein graft to the PDA, and saphenous vein graft to the circumflex.  3. Hypertension.  4. Dyslipidemia.  5. Diabetes mellitus.  6. Long-term medication use.   [name] came into the hospital on Nov 04, 2006 in acute heart failure  with a combination of her ischemic cardiomyopathy, in which her ejection  fraction was 25%, as well as some diastolic component.  She is also  extremely hypertensive and had a diastolic blood pressure in the 120s.  For this reason, we think this has caused her aggressive heart failure  to appear.   Over the next several days, she was placed on and weaned of IV  nitroglycerin and diuretic therapy.  Her Coreg was increased, and by the  date of discharge, Nov 08, 2006, she was discharged to home in stable  condition with blood pressure was 138/97.   Lab studies show white count of 7.2, hemoglobin 11, hematocrit 33.7, and  platelets 297.  Sodium 137, potassium 4.1, BUN 30, creatinine 1.25,  maximum troponin was 0.09 felt to be secondary to the volume overload.  Her BNP on admission was 1779.0.  TSH was 2.732.   A 2D echocardiogram revealed left ventricular dilatation, ejection  fraction of 15% with diffuse LV hypokinesis with regional variation with  no diagnostic evidence of wall motion abnormalities, regionally.  Left  atrial  enlargement, mild mitral regurgitation, estimated peak right  ventricular systolic pressure mildly increased at 33 mmHg.  The inferior  vena cava was mildly dilated.   Chest x-ray:  Cardial enlargement with improved aeration.   The patient was discharged to home in stable condition.   DISCHARGE MEDICATIONS:  1. Videl 37.5/25 twice a day.  2. Coreg 25 mg twice a day.  3. Lasix 40 mg a day.  4. Lisinopril 40 mg a day.  5. Zocor 20 mg a day.  6. Lanoxin 0.125 mg a day.  7. Potassium 20 mEq a day.  8. Glucotrol 10 mg a day.  9. Coated aspirin 81 mg one tablet daily.  10.NovoLog insulin prior to admission.  11.Sublingual nitroglycerin as needed for chest pain.   Follow up with Dr. Mayford Knife in two weeks and remain on a low-sodium, heart  healthy diet.  Call for any questions or concerns.  The patient is  discharged to home in stable and improved condition.  The patient is  encouraged not to smoke or  drink alcohol.      Guy Franco, P.A.      Armanda Magic, M.D.  Electronically Signed    LB/MEDQ  D:  11/29/2006  T:  11/29/2006  Job:  [id number]   cc:   Armanda Magic, M.D.

## 2010-10-28 NOTE — Cardiovascular Report (Signed)
[name], [name]               ACCOUNT NO.:  [id number]   MEDICAL RECORD NO.:  [id number]          PATIENT TYPE:  INP   LOCATION:  6531                         FACILITY:  [facility]   PHYSICIAN:  Francisca December, M.D.  DATE OF BIRTH:  07-31-1950   DATE OF PROCEDURE:  12/10/2006  DATE OF DISCHARGE:                            CARDIAC CATHETERIZATION   PROCEDURE PERFORMED:  Percutaneous coronary intervention/ drug-eluting  stent implantation, proximal anterior descending artery.   INDICATIONS:  [name] is an unfortunate 60 year old woman who has  progressive graft disease now 6-9 months after CABG.  Her LIMA to the  LAD has a septal stenosis at the anastomosis.  The saphenous vein graft  to the diagonal is completely occluded.  She has severe ischemic and  nonischemic cardiomyopathy with an LVEF of 18%.  She has recently been  admitted with prolonged unstable angina.  Dr. Armanda Magic has completed  coronary and graft angiography revealing the subtotal stenosis in the  anastomosis of the LIMA to the LAD.  There is severe proximal disease.  The diagonal branch is not approachable with percutaneous intervention.  She is to undergo catheter-based revascularization of the anterior  descending artery at this time.   PROCEDURE NOTE:  The patient was brought to the cardiac catheterization  laboratory in the fasting state.  Goals, risks, and alternatives of the  procedure have been discussed with the patient at length.  The patient  states understanding, has had her questions answered, and wishes to  proceed.  The right groin was prepped and draped in the usual sterile  fashion.  Local anesthesia was obtained with the infiltration of 1%  lidocaine.  A 6-French catheter sheath was inserted percutaneously into  the right femoral artery utilizing an anterior approach over a guiding J-  wire.  The patient then received 0.75 mg/kg bolus of bivalirudin over 2  minutes and then a 1.75 mg/kg per  hour constant infusion.  The resultant  ACT was 336 seconds.  She had been pre-treated with 300 mg of Plavix and  325 mg of aspirin.  A 0.014 inch Luge intracoronary guidewire was passed  across the lesion in the proximal and mid anterior descending artery  without difficulty.  This was a diffuse lesion extending over 28-mm.  Initial balloon dilatation was performed with a 2.5 x 20-mm Scimed  Maverick intracoronary balloon.  This was carefully positioned and  inflated to a peak pressure of 10 atmospheres for 35 seconds.  This  balloon was deflated and removed and a 2.75 x 33-mm Cordis CYPHER drug-  eluting stent was advanced into place, carefully positioned, and  deployed at peak pressure of 14 atmospheres for 31 seconds.  The stent  balloon was deflated, removed, and post dilatation was performed with a  3-0 x 20-mm Scimed Quantum Maverick intracoronary balloon.  This was  positioned in the more proximal portion of the stent and inflated to 18  atmospheres for 24 seconds.  The balloon was deflated and removed with  advanced slightly to the more distal segment of the stent.  It was  reinflated to 16  atmospheres for 14 seconds.  The balloon was deflated  and removed and following confirmation of adequate patency in orthogonal  views both with and without the guidewire in place, the guiding catheter  was removed.  A right femoral arteriogram in the 45 degrees RAO  angulation via the catheter sheath by hand injection documented adequate  anatomy for placement of the percutaneous closure device, Angio-Seal.  This was subsequently deployed with good hemostasis and an intact distal  pulse.   ANGIOGRAPHY:  As mentioned, the lesion treated was in the proximal and  mid portion of the anterior descending artery which was diffusely  diseased and 90% stenotic at its greatest portion.  Following balloon  dilatation and stent implantation, there was no residual stenosis and  there was a good step-up  and step-down at the proximal and distal ends  of the stent.   FINAL IMPRESSION:  1. Atherosclerotic cardiovascular disease, severe three-vessel.  2. Status post successful percutaneous coronary intervention and drug-      eluting stent implantation, proximal and mid anterior descending      artery.  3. Typical angina was reproduced with device insertion and balloon      inflation.      Francisca December, M.D.  Electronically Signed     JHE/MEDQ  D:  12/10/2006  T:  12/10/2006  Job:  [id number]   cc:   Armanda Magic, M.D.

## 2010-10-31 NOTE — H&P (Signed)
NAMEJEVON, LITTLEPAGE NO.:  [id number]   MEDICAL RECORD NO.:  [id number]          PATIENT TYPE:  EMS   LOCATION:  MAJO                         FACILITY:  [facility]   PHYSICIAN:  Vesta Mixer, M.D. DATE OF BIRTH:  09-18-1950   DATE OF ADMISSION:  07/17/2006  DATE OF DISCHARGE:                              HISTORY & PHYSICAL   support to Dr. Eliott Nine   Mrs. Cobb is a 60 year old black female with a history of moderate  coronary artery disease, hypertension, diabetes mellitus, congestive  heart failure, pancreatitis.  She is admitted with three weeks of nearly  constant chest pain relieved with nitroglycerin.   The patient has a long history of moderate coronary artery disease.  She  also has what sounds like a hypertensive cardiomyopathy.  She presents  with three weeks of intermittent chest pain.  This chest pain is  described as a right-sided chest pain with radiation down her right arm.  It typically hurts more if she bends over.  It also has occurred at  rest.  She also notes that it hurts when she carries out the groceries.  She takes a nitroglycerin with relief of the pain, but the pain returns  about an hour later.  She is currently pain-free, but came into the  emergency room because she has been typically having more pain at night.   CURRENT MEDICATIONS:  1. Digoxin 0.125 mg a day.  2. Norvasc 10 mg a day.  3. Coreg 6.25 mg twice a day.  4. Lisinopril 40 mg a day.  5. Metformin 1000 mg twice a day.  6. Glipizide 10 mg twice a day.  7. Insulin NPH 10 units at bedtime.  8. Aspirin 81 mg a day.   ALLERGIES:  She is allergic to MORPHINE and DILAUDID.   PAST MEDICAL HISTORY:  1. History of pancreatitis  2. History of moderate coronary artery disease.  3. Hypertension.  4. Diabetes mellitus.  5. Congestive heart failure with an ejection fraction of 30%.   SOCIAL HISTORY:  The patient is a nonsmoker.   FAMILY HISTORY:  Positive for coronary  disease.   PHYSICAL EXAMINATION:  GENERAL:  She is a middle-aged black female.  She  is currently pain-free.  VITAL SIGNS:  Blood pressure is 173/102, temperature is 97.8, heart rate  is 93.  HEENT:  Reveals 2+ carotids.  She has no bruits, no JVD, no thyromegaly.  LUNGS:  Clear to auscultation.  HEART:  Regular rate .  S1 and S2.  ABDOMEN:  Good bowel sounds, nontender.  EXTREMITIES:  She has no clubbing, cyanosis or edema.  NEUROLOGIC:  Nonfocal.   Her EKG reveals normal sinus rhythm.  She has voltage for left  ventricular hypertrophy.  There are no acute changes.   IMPRESSION AND PLAN:  1. Chest pain.  Her chest pain is very atypical.  She has had two to      three weeks of nearly constant chest pain, but it has been relieved      with nitroglycerin.  Will check cardiac enzymes.  A nitroglycerin  drip and heparin drip have already been started.  We will continue      those for the time being.  She had a stress Cardiolite study of      some sort at the [facility] but she does not know the      results and results are not available.  We will admit her and have      Dr. Mayford Knife see her on Monday.  2. Hypertension.  The patient's blood pressure is moderately elevated.      This may be because of the anxiety of being in the ER.  In addition      she may not have received all her medications today.  Will  see if      the nitroglycerin drip brings down her blood pressure.  3. Diabetes mellitus.  We will check Accu-Cheks.  a little glucose           ______________________________  Vesta Mixer, M.D.     PJN/MEDQ  D:  07/18/2006  T:  07/18/2006  Job:  [id number]

## 2010-10-31 NOTE — Cardiovascular Report (Signed)
NAMECHESSA, BARRASSO               ACCOUNT NO.:  [id number]   MEDICAL RECORD NO.:  [id number]          PATIENT TYPE:  INP   LOCATION:  3705                         FACILITY:  [facility]   PHYSICIAN:  Armanda Magic, M.D.     DATE OF BIRTH:  [birth year]   DATE OF PROCEDURE:  07/23/2006  DATE OF DISCHARGE:                            CARDIAC CATHETERIZATION   PROCEDURE:  Left heart catheterization, coronary angiography, left  ventriculography.   OPERATOR:  Armanda Magic, M.D.   INDICATIONS:  Chest pain, abnormal Cardiolite.   COMPLICATIONS:  None.   INTRAVENOUS ACCESS:  Via right femoral artery 6-French sheath   PROCEDURE:  This is a 59 year old obese black female with a history of  moderate coronary disease in the past, hypertension, anemia, dilated  cardiomyopathy and dyslipidemia who presented with atypical chest pain.  Cardiac enzymes were slightly elevated with troponin.  Chest pain was  atypical though in that it would go away with GI cocktail and  nitroglycerin.  She now presents for cardiac catheterization because of  an abnormal Cardiolite that we were able to obtain from the [place] from last  month which showed several areas of reversibility consistent with  ischemia.   The patient is brought to the cardiac catheterization laboratory in the  fasting nonsedated state.  Informed consent was obtained.  The patient  was connected to continuous heart rate and pulse oximetry monitoring and  intermittent blood pressure monitoring.  The right groin was prepped and  draped in a sterile fashion.  One percent Xylocaine was used for local  anesthesia.  Using the modified Seldinger technique a 6-French sheath  was placed in the right femoral artery.  Under fluoroscopic guidance a 6-  [place] JL-4 catheter was placed in the left coronary artery.  Multiple  cine films were taken at 30 degree RAO and 40 degree LAO views.  This  catheter was then exchanged out over a guidewire for 6-French JR-4  catheter which was placed under fluoroscopic guidance in the right  coronary artery.  Multiple cine films were taken at 30 degree RAO and 40  degree LAO views.  This catheter was then exchanged out over a guidewire  for a 6-French angled pigtail catheter which was placed under  fluoroscopic guidance in the left ventricular cavity.  The left  ventriculography was performed in the 30 degree RAO view using a total  of 30 mL contrast at 50 mL per second.  The catheter was then pulled  back across the aortic valve with no significant gradient noted.  At the  end procedure all catheters and sheaths were removed.  Manual  compression was performed until adequate hemostasis was obtained.  The  patient was transferred back to room in stable condition.   RESULTS:  The left main coronary artery is widely patent and trifurcates  into left anterior descending artery, ramus branch and left circumflex  artery.   The left anterior descending artery gives rise to a first diagonal  branch which has an 80% mid stenosis then just distal to the takeoff of  the first diagonal there is an 80-90%  long stenosis of the LAD and then  just distal to that there is another 80% narrowing of the LAD, the LAD  does traverse the apex.   The ramus branch is mild to moderate size and widely patent.   The left circumflex is widely patent at its proximal portion giving rise  to a very small obtuse marginal #1 branch.  There is then a 90% stenosis  in the midportion before the takeoff of a second obtuse marginal branch  which is extremely large and bifurcates into two daughter branches.  Both of those branches are widely patent.  The ongoing circumflex  traversing the AV groove is widely patent.   The right coronary artery has a 70-80% proximal to mid stenosis before  bifurcating into a posterior descending artery and posterior lateral  artery both of which are widely patent.   Left ventriculography shows severe global  LV dysfunction, EF 25-30%,  left ventricular pressure 161/10 mmHg and aortic pressure 162/85 mmHg.   ASSESSMENT:  1. Severe three-vessel coronary disease.  2. Severe global left ventricular dysfunction.  3. Unstable angina pectoris.  4. History of pancreatitis remotely with pancreatic pseudocyst by      abdominal CT.  5. Hypertension.   PLAN:  CVTS consult.  Discontinue Lovenox and start IV heparin.  Start  IV nitro drip.  Continue aspirin, Coreg and lisinopril.  No Glucophage  48 hours.      Armanda Magic, M.D.  Electronically Signed     TT/MEDQ  D:  07/23/2006  T:  07/23/2006  Job:  [id number]   cc:   Dione Booze, MD

## 2010-10-31 NOTE — H&P (Signed)
NAMESUAD, AUTREY               ACCOUNT NO.:  [id number]   MEDICAL RECORD NO.:  [id number]          PATIENT TYPE:  INP   LOCATION:  2304                         FACILITY:  [facility]   PHYSICIAN:  Guadalupe Maple, M.D.  DATE OF BIRTH:  07/11/1950   DATE OF ADMISSION:  07/17/2006  DATE OF DISCHARGE:                              HISTORY & PHYSICAL   Audio too short to transcribe (less than 5 seconds)           ______________________________  Guadalupe Maple, M.D.     DCJ/MEDQ  D:  07/26/2006  T:  07/26/2006  Job:  [id number]

## 2010-10-31 NOTE — Consult Note (Signed)
[place]  Patient:    [name], [name] Visit Number: [id number] MRN: [id number]          Service Type: SUR Location: MICU 2105 01 Attending Physician:  Vikki Ports. Dictated by:   Armanda Magic, M.D. Proc. Date: 11/22/01 Admit Date:  11/21/2001   CC:         Catalina Lunger, M.D.   Consultation Report  CHIEF COMPLAINT:  Asked to see patient for management of cardiac disorders.  HISTORY OF PRESENT ILLNESS:  This is a 60 year old black female with a history of pancreatic pseudocyst, cholelithiasis, coronary artery disease, idiopathic dilated cardiomyopathy, and one-vessel coronary artery disease, now status post cholecystectomy and cyst gastrotomy.  We are asked to follow from a cardiac standpoint.  She is currently without complaints.  Her urine output has been down on the low side, but slightly increased with IV fluids.  She denies any chest pain or shortness of breath.  PAST MEDICAL HISTORY:  Diabetes mellitus, hypertension, CHF, idiopathic dilated cardiomyopathy, status post acute pancreatitis with pancreatic pseudocyst, cholelithiasis.  PAST SURGICAL HISTORY:  Status post tubal ligation in 1972.  Status post vaginal hysterectomy in 1975.  Now status post cholecystectomy and cyst gastrotomy.  ALLERGIES:  PENICILLIN, MORPHINE.  MEDICATIONS: 1. Glucotrol 10 mg a day. 2. Lisinopril 40 mg q.h.s. 3. Felodipine 10 mg a day. 4. Aspirin 81 mg a day. 5. Phenergan p.r.n. 6. Spironolactone 25 mg a day.  FAMILY HISTORY:  Her father died at 28 of a gunshot wound.  Her brother died at 25 of a stab wound.  Her mother died at 46 of coronary artery disease, hypertension, and diabetes.  She has a sister 23 with hypertension and diabetes and a sister at 81 with hypertension and diabetes.  She does not smoke or drink alcohol.  SOCIAL HISTORY:  She is not married.  She has two children in good health. 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  She does not smoke or drink.  REVIEW OF SYSTEMS:  As stated in HPI.  Currently she complains of pain from her surgery.  PHYSICAL EXAMINATION:  VITAL SIGNS: Blood pressure 120/75, heart rate 102, O2 saturations 97% on 4 liters nasal cannula.  I&O 1515 in and 200 out.  Pulmonary artery pressures 45/25 mmHg, CDP 11 mmHg, cardiac output 7.19, cardiac index 3.96, pulmonary capillary wedge 15 to 18 mmHg.  LUNGS:  Clear to auscultation anteriorly.  HEART:  Tachycardic and regular.  No murmurs, rubs, or gallops.  Normal S1 and S2.  ABDOMEN:  Soft.  EXTREMITIES:  No cyanosis, erythema, or edema.  LABORATORY DATA:  Sodium 137, potassium 3.8, glucose 107, bicarb 27, BUN 18, creatinine 1.2, glucose 132.  White blood cell count 12.9, hemoglobin 9.4, hematocrit 28.4, platelet count 324.  Telemetry shows sinus tachycardia. Chest x-ray shows cardiomegaly with questionable mild CHF.  ASSESSMENT: 1. Idiopathic dilated cardiomyopathy with questionable CHF on chest x-ray,    although on examination she does not have evidence of CHF. Pulmonary    artery diastolic pressure range 19 to 25 mmHg and pulmonary capillary wedge    pressure is slightly down at 15 to 18 mmHg.  I think her decreased urine    output is most likely secondary to volume depletion, her pulmonary    capillary wedge is currently at 15 which is on the low side for what she    should be given her dilated cardiomyopathy.  Her chest x-ray shows    questionable mild  CHF, but she has no rales on examination. 2. Sinus tachycardia questionably secondary to increased pain and volume    depletion. 3. Pancreatic pseudocyst, status post resection. 4. Hypertension, stable. 5. Diabetes mellitus.  PLAN: 1. IV bolus with normal saline solution at 500 cc every one hour. 2. Pulmonary capillary wedge and PA pressure measurements every four hours.    I would like to see her pulmonary capillary wedge around 20 mmHg given    her dilated  cardiomyopathy with decreased EF.  She needs to maintain    filling pressures on the high side in order to have adequate renal    perfusion. 3. No Coreg secondary to history of heart block. 4. Restart other medications, except hold spironolactone. Dictated by:   Armanda Magic, M.D. Attending Physician:  Danna Hefty R. DD:  11/22/01 TD:  11/24/01 Job: [id number]

## 2010-10-31 NOTE — Op Note (Signed)
[name], [name]               ACCOUNT NO.:  [id number]   MEDICAL RECORD NO.:  [id number]          PATIENT TYPE:  INP   LOCATION:  2304                         FACILITY:  [facility]   PHYSICIAN:  Kerin Perna, M.D.  DATE OF BIRTH:  25-Apr-1951   DATE OF PROCEDURE:  07/26/2006  DATE OF DISCHARGE:                               OPERATIVE REPORT   OPERATION:  Coronary artery bypass grafting x4 (left internal mammary  artery to left anterior descending,, saphenous vein graft to diagonal,  saphenous vein graft to circumflex marginal, saphenous vein graft to  posterior descending).   PRE AND POSTOPERATIVE DIAGNOSIS:  Class IV unstable angina with class  III congestive heart failure, severe three-vessel coronary disease with  moderate to severe left ventricular dysfunction.   SURGEON:  Kerin Perna, M.D.   ASSISTANT:  Sheliah Plane, MD and Jerold Coombe, P.A.-C.   ANESTHESIA:  General.   INDICATIONS:  The patient is a 60 year old diabetic who had had a  previous positive stress test due to progressive symptoms of dyspnea on  exertion.  A cardiac catheterization by Dr. Mayford Knife demonstrated severe  three-vessel coronary artery disease with severe LV dysfunction.  A  preoperative 2-D echo showed ejection fraction of 25-30% with trivial  mitral regurgitation and tricuspid regurgitation.  She had left  ventricular hypertrophy from longstanding hypertension as well.  Due to  her coronary anatomy and LV dysfunction.  She is felt to be candidate  for surgical coronary revascularization.   Prior to surgery I examined the patient in her hospital room and  reviewed results of the cardiac cath with the patient and her family.  I  discussed indications and expected benefits of coronary bypass surgery.  I reviewed the alternatives to surgical therapy as well.  I discussed  with the patient the major aspects of the planned procedure including  the choice of conduit to include internal  mammary artery and  endoscopically harvested saphenous vein, the location of the surgical  incisions, use of general anesthesia and cardiopulmonary bypass, and the  expected postoperative hospital recovery.  I discussed with the patient  the risks to her of coronary artery bypass surgery including risks of  MI, CVA, bleeding, blood transfusion requirement, infection, and death.  She understood because of her baseline anemia with hematocrit of 32%  that she would be likely to require blood cell transfusion therapy.  After reviewing all these issues, she demonstrated her understanding and  agreed to proceed with operation under what I felt was an informed  consent.   OPERATIVE FINDINGS:  The coronaries were diffusely diseased and a  diabetic pattern.  The patient received 3 units of packed cell the  operating room.  She received 1 unit of platelets after reversal of  heparin with protamine due to persistent coagulopathy.  She had no  significant left ventricular dilatation and a moderate pulmonary  hypertension.  The vein was harvested from both thighs with the  endoscopic technique.  The vein below the knee was small and a poor  conduit.  This was true in both legs.   PROCEDURE:  The patient was brought to operating room and placed supine  on the operating table.  General anesthesia was induced.  The chest,  abdomen and legs were prepped with Betadine and draped as a sterile  field.  A sternal incision was made as the saphenous vein was harvested  endoscopically from both thighs.  The left internal mammary artery was  harvested as a pedicle graft from its origin at the subclavian vessels.  It was 1.2 mm vessel with good flow.  Heparin was administered and the  ACT was documented as being therapeutic.  The sternal retractor was  placed and the pericardium was opened.  There is a moderate pericardial  effusion.  The pericardium was suspended.  Pursestrings were placed in  the ascending  aorta and right atrium and the patient was cannulated and  placed on bypass.  The coronaries were identified for grafting.  The  mammary artery and vein grafts were prepared for the distal anastomoses.  Cardioplegia catheters were placed for both antegrade and retrograde  coronary sinus cardioplegia.  The patient was cooled to 32 degrees.  The  aortic crossclamp was applied.  800 mL of cold blood cardioplegia was  delivered in split doses between the antegrade and retrograde coronary  sinus catheters.  There is good cardioplegic arrest and septal  temperature dropped less than 14 degrees.   The distal coronary anastomoses were performed.  The first distal  anastomosis was to the posterior descending branch of the right.  This  was a 1.5-mm vessel proximal 80% stenosis.  Reverse saphenous vein was  sewn end-to-side with running 7-0 Prolene.  There is good flow through  graft.  The second distal anastomosis was to the diagonal branch to LAD.  This a smaller 1.0 mm vessel with proximal 80% stenosis.  It was  diffusely diseased with thickened wall.  A reverse saphenous vein was  sewn end-to-side with running 7-0 Prolene.  There was adequate flow  through the graft.  Cardioplegia was redosed.  The third distal  anastomosis was to the large circumflex marginal.  This a 1.5-mm vessel  with proximal 18% stenosis.  Reverse saphenous vein was sewn end-to-side  with running 7-0 Prolene.  There is excellent flow through graft.  Cardioplegia was redosed.  The fourth distal anastomosis was to the  distal aspect of the LAD which had diffuse disease.  It was applied in  an area of minimal disease.  The left IMA pedicle was brought through an  opening created in the left lateral pericardium and was brought down  onto the LAD and sewn end-to-side with running 8-0 Prolene.  There is  good flow through the anastomosis after briefly releasing the pedicle bulldog on the mammary pedicle.  The bulldog was  reapplied and the  pedicle secured to the epicardium.  Cardioplegia was redosed.   While the crossclamp was still in place, three proximal vein anastomoses  were performed on the ascending aorta using a 4.0-mm punch running 7-0  Prolene.  Prior to tying down the final proximal anastomosis, air was  vented from the coronaries and left side of heart with a dose of  retrograde warm blood cardioplegia and the usual de-airing maneuvers on  bypass.  The crossclamp was then removed as the final proximal  anastomosis was tied.   The heart was cardioverted back to a regular rhythm.  Air was aspirated  from the vein grafts and each was opened and had good flow.  The  cardioplegia catheters were  removed.  The proximal and distal  anastomoses were checked and found be hemostatic.  The patient was then  rewarmed to 37 degrees.  Temporary pacing wires were applied.  The  epicardial wires were placed and a biventricular pacing configuration  because of her LV dysfunction.  When the patient was rewarmed, the lungs  re-expanded.  The ventilator was resumed.  The patient was then weaned  from bypass without difficulty.  Cardiac output and blood pressure were  stable.  The echo showed improved LV global function with EF now 40-45%.  Protamine was administered and there was no adverse reaction.  The  cannulas removed.  The mediastinum was irrigated with warm antibiotic  irrigation.  Leg incision irrigated and closed in a standard fashion.  The superior pericardial fat was closed over the aorta and vein grafts.  Two mediastinal and a left pleural chest tube were placed brought out  through separate incisions.  The sternum was closed with interrupted  steel wire.  The pectoralis fascia was closed in running #1 Vicryl.  Subcutaneous and skin layers were closed in running Vicryl and sterile  dressings were applied.  Total bypass time was 132 minutes with a  crossclamp time of 88 minutes.      Kerin Perna, M.D.  Electronically Signed     PV/MEDQ  D:  07/26/2006  T:  07/27/2006  Job:  [id number]   cc:   CVTS ofc  Armanda Magic, M.D.

## 2010-10-31 NOTE — Discharge Summary (Signed)
NAMERENIE, STELMACH NO.:  [id number]   MEDICAL RECORD NO.:  [id number]          PATIENT TYPE:  INP   LOCATION:  2009                         FACILITY:  [facility]   PHYSICIAN:  Kerin Perna, M.D.  DATE OF BIRTH:  Aug 09, 1950   DATE OF ADMISSION:  07/17/2006  DATE OF DISCHARGE:                               DISCHARGE SUMMARY   CARDIOLOGIST:  Dr. Armanda Magic   GASTROENTEROLOGIST:  Dr. Ewing Schlein   DISCHARGE DATE:  Tentatively in 1-2 days.   ADMISSION DIAGNOSIS:  Chest pain.   DISCHARGE DIAGNOSES:  1. Severe 3-vessel coronary artery disease, status post coronary      artery bypass graft.  2. Diabetes mellitus, on insulin.  3. History of congestive heart failure.  4. Left ventricular dysfunction.  5. Pancreatitis.  6. History of an ejection fraction of 30%.  7. Hypertension.  8. Hyperlipidemia.   IN THE HOSPITAL DIAGNOSES:  1. Acute blood loss anemia.  2. Volume overload.   CONSULTS:  1. On July 21, 2006, Dr. Ewing Schlein was consulted for gastroenterology.  2. On July 23, 2006, Dr. Kathlee Nations Trigt was consulted for      cardiothoracic surgery.   PROCEDURES:  1. On July 21, 2006, patient underwent upper GI endoscopy with Dr.      Ewing Schlein.  2. July 23, 2006, patient underwent cardiac catheterization by Dr.      Armanda Magic.  3. On July 26, 2006, she underwent coronary artery bypass graft x4      (left internal mammary artery to the LAD, saphenous vein graft to      the diagonal, saphenous vein graft to the PD, saphenous vein graft      to the circumflex and endoscopic vein harvest bilateral thighs) by      Dr. Kathlee Nations Trigt.   HISTORY AND PHYSICAL:  This is a 60 year old African American female was  recently diagnosed with severe 3-vessel coronary artery disease with  reduced LV function.  She was admitted on July 17, 2006, after having  nocturnal upper abdominal lower chest pain.  She had gas bloat type  symptoms and stated the pain was  relieved with nitroglycerin.  She has a  history of hypertension, anemia, dilated cardiomyopathy and  dyslipidemia.  Please see dictated history and physical for further  details.   ADMISSION IN HOSPITAL SUMMARY:  On admission, the patient's pain is  relieved with nitroglycerin and GI cocktail.  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  Patient underwent an upper  endoscopy by Dr. Leary Roca, which showed no acute peptic gastric  disease.  The patient does have a history of having an abnormal  Cardiolite, which was obtained from the [place] from last month, which showed  several areas of reversibility consistent with ischemia.  Patient did  undergo a cardiac catheterization on February 8, which showed 90%  stenosis of the LAD, 80-90% stenosis of the circumflex, 80% stenosis of  the ramus and 70-80% stenosis of the right coronary artery mid vessel.  An EF of 30%.  A left ventricular and  diastolic pressure was measured at  13 mmHg.  Because of her severe 3-vessel coronary artery disease, she  was felt to be a candidate for surgical revascularization, which was  scheduled for February 11.   Prior to surgery she had a preoperative 2D, which showed an EF of 25-  30%, which revealed mitral regurgitation and tricuspid regurgitation.  Left ventricular hypertrophy from longstanding hypertension.  Coronary  anatomy of LV dysfunction.  She had preoperative carotid Dopplers, which  showed no significant plaque bilaterally.  No internal carotid artery  stenosis.  ABIs were within normal limits bilaterally.   Prior to surgery, the patient was maintained on heparin per pharmacy.  Her CK-MBs were negative.  Her troponin was 2.09.  She did complain of  chest pain before her surgery; however, this was again relieved with a  GI cocktail and nitroglycerin.  Patient does have a history of diabetes  mellitus.  She was maintained on a sliding scale insulin, ABGs  controlled.  On  July 22, 2006, telemetry showed normal sinus rhythm  at 76 beats per minute.  EKG showed sinus tachy at 100 beats per minute  with downgoing ST-segments in AVL.  She did undergo a CT of the abdomen,  which showed a small cyst; however, this it was thought this was not  causing her symptoms.   July 26, 2006, patient underwent coronary artery bypass grafting x4  without any complications.  Postoperatively, her stay has been  uneventful and she has been progressing quite well.  Patient did have  some blood loss postoperatively; however, she did not require any blood  transfusions.  Her hemoglobin and hematocrit have remained stable and  raised appropriately.  Currently, her hemoglobin is 9.8.  She does have  a history of diabetes mellitus.  She has been maintained on a sliding  scale of insulin.  Her Glucotrol was started postoperatively.  She has  been on Lantus 25 units subcu q.h.s.  Her CBGs have been 209/174/126.  She does have postop volume overload with a weight currently of 84  kilograms, preop weight is 77.  She is being diuresed with Lasix 2 times  daily and responding appropriately.  Patient will continue to be  diuresed.   Patient's blood pressure, postoperatively, has been well controlled.  She is on Coreg and lisinopril.  Patient's current blood pressure is 100-  135/60-80. The patient did have some acute renal insufficiency  postoperative with an increase in her creatinine.  This was thought due  secondary to diuresis.  On postop day number 3, creatinine was 1.49;  however, it decreased on postop day number 4 to 1.3.   Patient was extubated without any complications.  She was able to be  weaned from her oxygen slowly.  Currently, 92% on room air.  Patient was  transferred to 2000 on postop day number 2.  She has been out of bed  walking with cardiac rehab with a steady gait.  She has been doing her  breathing exercises appropriately.  Patient's incision is clean,  dry, intact and healing well.  Postoperatively, she did have an increase in her white blood cell count,  16,000; however, she has remained afebrile.  Her white blood cell count  has been decreasing daily.  Right now it is 11.7.  She did have a bowel  movement on postop day number 4.  She is tolerating her diet well.   DISCHARGE DISPOSITION:  The patient will be discharged home in the next  2-3 days provided heart rate remains stable, she remains afebrile and  her renal insufficiency is improved.   MEDICATIONS:  1. Aspirin 81 mg p.o. daily.  2. Coreg 6.25 mg p.o. b.i.d.  3. Lisinopril 20 mg p.o. daily.  4. Zocor 20 mg p.o. daily.  5. Digoxin 0.125 mg p.o. daily.  6. Glucotrol 10 mg p.o. daily.  7. Lantus 25 units subcu q.h.s.  8. Lasix 40 mg p.o. daily x5 days.  9. Potassium chloride 20 mEq p.o. daily x5 days.  10.Prilosec 20 mg p.o. daily.  11.Percocet 5/325 one to two tabs p.o. every 4 hours p.r.n.   INSTRUCTIONS:  The patient is instructed to follow a low-fat, low-salt  diabetic diet.  No driving or heavy lifting greater than 10 pounds for 3  weeks.  The patient is to ambulate 3 times daily.  Activities as  tolerated.  She is to continue her breathing exercises.  She may shower,  clean the incisions with mild soap and water.  She is to call our office  if any wound problems shall arise, such as incision erythema, drainage,  temp greater than 101.5.   FOLLOWUP:  The patient is to follow up with Dr. Mayford Knife with an  appointment in 2 weeks.  She will have a chest x-ray taken prior to  seeing Dr. Mayford Knife.  She is to follow up with Dr. Donata Clay on August 20, 2006, at 11:00 a.m.  The office will contact.      Constance Holster, [place]      Kerin Perna, M.D.  Electronically Signed    JMW/MEDQ  D:  07/30/2006  T:  07/31/2006  Job:  [id number]

## 2010-10-31 NOTE — Discharge Summary (Signed)
[place]  Patient:    [name], [name] Visit Number: [id number] MRN: [id number]          Service Type: MED Location: [phone] Attending Physician:  Vikki Ports. Dictated by:   Earna Coder, M.D. Admit Date:  12/13/2001 Discharge Date: 12/15/2001   CC:         Armanda Magic, M.D.   Discharge Summary  ADMISSION DIAGNOSES:  Abdominal pain and dehydration, status post cyst gastrostomy.  DISCHARGE DIAGNOSES:  Abdominal pain and dehydration, status post cyst gastrostomy.  ATTENDING PHYSICIAN:  Earna Coder, M.D.  CONDITION AT DISCHARGE:  Good and improved.  DISPOSITION:  Discharged home follow-up with me in four to five days.  DISCHARGE MEDICATIONS:  Same as admission except for the addition of Augmentin 875 mg 1 p.o. b.i.d.  HOSPITAL COURSE:  The patient was admitted, now three weeks status post cyst gastrostomy for a large pseudocyst, presented with a three-day history of abdominal discomfort, nausea and vomiting.  The patient was slightly dizzy when seen by Dr. Carolanne Grumbling, found to be tachycardic and orthostatic hypotension.  Evaluated the patient in the emergency room.  She had a fairly normal abdominal exam.  She was admitted, hydrated.  She had a white count of 19,000.  She was empirically started on Unasyn.  She underwent upper GI the following day which showed normal anatomy except for some mild extrinsic compression and normal emptying of the stomach.  The patient ran low-grade temperatures as high as 101.5 but was tolerating a diet by hospital day 2 and wanted to go home.  I opted to let the patient go home on Augmentin but could not do a CAT scan because of the barium from the upper GI, and therefore that will be scheduled as an outpatient in four days and she will follow up with me at that time. Dictated by:   Earna Coder, M.D. Attending Physician:  Danna Hefty R. DD:   12/15/01 TD:  12/15/01 Job: [id number]

## 2010-10-31 NOTE — H&P (Signed)
[name], [name]               ACCOUNT NO.:  [id number]   MEDICAL RECORD NO.:  [id number]          PATIENT TYPE:  INP   LOCATION:  2304                         FACILITY:  [facility]   PHYSICIAN:  Guadalupe Maple, M.D.  DATE OF BIRTH:  09-Dec-1950   DATE OF ADMISSION:  07/17/2006  DATE OF DISCHARGE:                              HISTORY & PHYSICAL   PROCEDURE:  Intraoperative transesophageal echocardiography.   Ms. Saydi Kobel is a 60 year old African-American with unstable  angina, severe three-vessel coronary disease, and moderate to severe  left ventricular dysfunction, who is scheduled to undergo coronary  artery bypass grafting by Kerin Perna, M.D.  Intraoperative  transesophageal echocardiography was requested to evaluate the left  ventricular function to determine if any valvular pathology was present  and serve as a monitor for intraoperative volume status.   The patient was brought to the operating room at [facility] and  general anesthesia was induced without difficulty.  The trachea was  intubated without difficulty.  The transesophageal echocardiography  probe was then inserted into the esophagus without difficulty.   IMPRESSION:  Pre bypass finding:   1. Aortic valve:  The aortic valve was trileaflet.  The leaflets      opened normally and there was no evidence of aortic insufficiency.      There was no aortic leaflet calcification noted.  2. Mitral valve:  The mitral valve annulus appeared moderately      dilated, but the leaflets coapted well and there was trace mitral      insufficiency.  There was no evidence of mitral valve prolapse and      no evidence of torn chordae prolapsing segments.  3. Left ventricle:  The left ventricle was dilated and there was      global hypokinesis of the left ventricular contractility noted.      There were no focal areas of thinning or akinesis but generalized      hypokinesis.  Ejection fraction was estimated at  25-30%.  There was      moderate to severe left ventricular hypertrophy.  Left ventricular      wall thickness measured 1.45 cm in the posterior wall at the      midpapillary level at end-diastole and 1.48 cm of the anterior wall      at the midpapillary level at end-diastole.  There was no evidence      of thrombus noted in the left ventricular apex.  4. Right ventricle:  The right ventricular size was normal.  There was      good contractility of the right ventricular free wall.  5. Tricuspid valve:  The tricuspid valve was intact and appeared      structurally normal, and there was trace tricuspid insufficiency.  6. Interatrial septum:  The interatrial septum was intact without      evidence of patent foramen ovale as assessed by color-flow Doppler.  7. Left atrium.  There was no smoke or spontaneous echo contrast noted      in the left atrial cavity.  There was no thrombus noted the  left      atrium or left atrial appendage.  8. Ascending aorta:  The ascending aorta showed no evidence of severe      atheromatous disease.  There was generalized thickening of the of      the wall of the aorta but no severe atheromata noted.  9. Descending aorta:  The descending aorta showed no significant      atheromatous disease and measured 2.68 cm in diameter.   Post bypass findings:  1. Aortic valve:  Again the aortic valve opened normally without      evidence of aortic insufficiency or aortic stenosis.  2. Mitral valve:  The mitral valve annulus appeared somewhat dilated      but there was good coaptation of the mitral valve leaflets and      trace mitral insufficiency.  3. Left ventricle:  The ventricular function appeared somewhat      improved from the prebypass study.  There was again generalized      global hypokinesis which appeared somewhat improved.  The ejection      fraction was estimated at 30-35%.  4. Right ventricle:  The right ventricular size again appeared to be      within  normal limits and there was good contractility to the right      ventricular free wall.           ______________________________  Guadalupe Maple, M.D.     DCJ/MEDQ  D:  07/26/2006  T:  07/27/2006  Job:  [id number]

## 2010-10-31 NOTE — Consult Note (Signed)
[name], [name]               ACCOUNT NO.:  [id number]   MEDICAL RECORD NO.:  [id number]          PATIENT TYPE:  INP   LOCATION:  3705                         FACILITY:  [facility]   PHYSICIAN:  Petra Kuba, M.D.    DATE OF BIRTH:  13-May-1951   DATE OF CONSULTATION:  07/21/2006  DATE OF DISCHARGE:                                 CONSULTATION   We were asked to do a consult today by Dr. Armanda Magic.   REASON FOR CONSULTATION:  His noncardiac related chest pain.   HISTORY OF PRESENT ILLNESS:  This is a 60 year old African American  female with a history of pancreatitis, cholecystectomy with gastrostomy  who is complaining of chest pain that has been bothering her  intermittently since her surgery in 2003.  She reports that in the last  3 weeks her chest pain has increased immensely and oftentimes prevented  sleep.  It seems to be worse in the early morning hours.  The patient  reports difficulty passing flatus as well as difficulty belching even  though she feels a tremendous amount of pressure and pain to both.  She  reports that her bowel movements are every other day, brown and easy to  pass.  Her weight is steady after a 53-pound weight loss in 2003 with  her surgery.  Her appetite is somewhat decreased but stable.  She does  not complain of dysphasia or odynophagia.  She does not take any NSAIDs  with the exception of an 81 mg aspirin per day.  She drinks no alcohol.  She reports no melena, hematemesis or hematochezia.  She describes the  pain as being a severe pressure in the center of her chest and then it  becomes painful as it radiates to her right shoulder all the way down  her right arm to her thumb.  Eating does not change her pain.  The  patient has experienced some pain relief in the past with PPI such as  Prilosec.  Also when she was in the emergency room, a GI cocktail  allowed her to have flatus and belch and this relieved her pain.   PAST MEDICAL HISTORY:  1.  Coronary artery disease.  2. Cardiomyopathy.  3. History of congestive heart failure.  4. Hypertension.  5. She is a type 2 diabetic.  6. She has had pancreatitis and pseudocysts in 2003   PAST SURGICAL HISTORY:  1. Cholecystectomy with cyst gastrostomy.  2. Bilateral tubal ligation as well as hysterectomy.  3. She has never had a colonoscopy or endoscopy.   CURRENT MEDICATIONS:  1. Aspirin 81 mg.  2. Digoxin.  3. Glipizide.  4. Insulin.  5. Lisinopril.  6. Norvasc.  7. Coreg.  8. Prilosec 20 mg daily.   ALLERGIES:  She has allergies to PENICILLIN and MORPHINE.   REVIEW OF SYSTEMS:  As per HPI.   SOCIAL HISTORY:  She is a retired Public house manager.  No alcohol.  No tobacco.   FAMILY HISTORY:  She has a sister with colon polyps but no known GI  cancers in the family.   PHYSICAL EXAM:  GENERAL APPEARANCE:  She is alert and oriented and in no  apparent distress.  VITAL SIGNS:  Temperature 97.9, pulse is 80, respirations are 16, blood  pressure is currently 157/108.  CARDIOVASCULAR:  Regular rate and rhythm without murmur, rubs or  gallops.  LUNGS:  Respirations are clear to auscultation bilaterally.  ABDOMEN:  Soft, nontender, nondistended with positive bowel sounds.   LABORATORY DATA:  Labs done today showed a hemoglobin of 10.9,  hematocrit of 32.1, white count of 8.5, platelets 321.  Labs done on  July 18, 2006, showed a PTT of 39.  PT 12.8, INR 1, BUN 16,  creatinine 0.92.  Her LFTs were normal.   ASSESSMENT:  Dr. Vida Rigger has seen and examined the patient.  His  assessment is that this is a 60 year old female with multiple medical  problems who is currently experiencing severe chest pain from likely  dyspepsia.   PLAN:  The patient is to be n.p.o. for upper endoscopy today with Dr.  Ewing Schlein.  Risks and benefits and logistics of the procedure were explained  to the patient and she is willing to consent and proceed.  Thanks very  much for this consultation.       Stephani Police, PA    ______________________________  Petra Kuba, M.D.    MLY/MEDQ  D:  07/21/2006  T:  07/21/2006  Job:  [id number]   cc:   Petra Kuba, M.D.

## 2010-10-31 NOTE — Discharge Summary (Signed)
[facility]  Patient:    [name], [name] Visit Number: [id number] MRN: [id number]          Service Type: MED Location: [phone] Attending Physician:  Armanda Magic Dictated by:   Ellwood Handler, M.D. Admit Date:  10/26/2001 Discharge Date: 10/31/2001   CC:         Loraine Leriche C. Ophelia Charter, M.D.   Discharge Summary  DISCHARGE DIAGNOSES: 1. Necrotizing pancreatitis. 2. Cholecystitis. 3. Diabetes mellitus type 2. 4. Hypertension. 5. Respiratory failure.  DISCHARGE MEDICATIONS: 1. Glucotrol 5 mg p.o. q.d. 2. Digoxin 0.25 mg p.o. q.d. 3. Lisinopril 40 mg 1/2 pill p.o. q.h.s. 4. Felodipine 10 mg SA p.o. q.d. 5. Aspirin 81 mg p.o. q.d. 6. Carvedilol 12.5 mg p.o. b.i.d. 7. Phenergan 25 mg p.o. q.4h. p.r.n. nausea. 8. Tylenol No. 3 one to two pills p.o. q.4h. p.r.n. pain.  CONSULTATIONS: 1. Surgery. 2. Critical care. 3. Pulmonary medicine.  HISTORY OF PRESENT ILLNESS:  Please see full H&P for details.  In brief, the patient is a 60 year old, African-American female who presented to [facility] Emergency Department with acute onset of epigastric abdominal pain followed by recurrent nausea and nonbloody vomiting.  PAST MEDICAL HISTORY: 1. Cholelithiasis. 2. Diabetes mellitus type 2. 3. Hypertension. 4. Cardiomegaly.  LABORATORY DATA AND X-RAY FINDINGS:  WBC 18.6, hemoglobin 15.3, hematocrit 45.2, platelets 387.  Sodium 132, potassium 4.0, chloride 99, CO2 21, glucose 279, BUN 22, creatinine 1.5, calcium 7.3.  Total protein 7.5, albumin 3.4, AST 370, ALT 739, Alk phos 298, total bilirubin 1.8.  Amylase 1406, lipase 1238;. Urinalysis revealed 100 glucose, moderate bilirubin, 15 ketones, 100 protein, positive nitrite, small leukocyte esterase.  PROCEDURES:  1. On August 19, 2001, abdominal ultrasound revealed cholelithiasis with     gallbladder wall thickening in upper common bile duct caliber, question     early cholecystitis.  Enlarged,  hypoechoic pancreas, question     pancreatitis.  2. On August 20, 2001, abdominal computed tomography scan without contrast     revealed severe pancreatitis, gallstones, bilateral pleural effusions,     right greater than left and bibasilar atelectasis, right worse than left.  3. On August 22, 2001, echocardiogram revealed overall left ventricular     function normal, left ventricular ejection fraction estimated to be     55-65%.  No left ventricular regional wall motion abnormalities.  Left     ventricular wall thickness mildly to moderately increased.  Aortic valve     mildly calcified.  Left atrium was mildly dilated.  There was a moderate     size left pleural effusion.  4. On August 24, 2001, computed tomography scan of chest with contrast     revealed no evidence of large central pulmonary emboli, although small     peripheral emboli cannot be entirely excluded.  Bilateral patch     infiltrates in the upper lobes, right greater than left.  Bibasilar     atelectasis with right greater than left with small bilateral, posterior     effusions.  Cardiomegaly.  Small amount of ascites.  5. On August 24, 2001, venous Dopplers revealed no evidence of deep venous     thrombosis, superficial thrombosis or Bakers cyst bilaterally.  6. On August 26, 2001, computed tomography scan of the pelvis and abdomen     revealed changes of acute pancreatitis, light enhancement of pancreatic     tissue in the tail body and head of the pancreas compatible with extensive  pancreatic necrosis.  Cholelithiasis.  Moderate amount of free fluid in     the pelvis.  7. On September 01, 2001, computed tomography scan of the abdomen with contrast     revealed severe pancreatitis with significant pancreatic necrosis.     Findings were similar to August 26, 2001.  Gallstones.  8. On September 01, 2001, computed tomography-guided aspiration of the pancreas     revealed pancreatic aspirate yielding about 5 cc of dark brown fluid.   9. On September 03, 2001 and September 06, 2001, placement of peripherally inserted     central catheter line. 10. On September 08, 2001, computed tomography scan of the abdomen with contrast     revealed extensive changes of pancreatitis including pancreatic necrosis     with only minimal portions of the pancreatic tail and body remaining     enhancing and diffuse fluid collections/developing pseudocyst.  There is     no fluid seen in the subcapsular space around the left hepatic lobe.     Otherwise, the fluid collections appear unchanged. 11. On September 12, 2001, computed tomography scan of the abdomen with contrast     revealed extensive changes of pancreatitis as discussed with a large     developing pseudocyst extending into the lesser sac and multiple     peripancreatic serpiginous fluid collections.  The pseudocyst extending     into the lesser sac region has increased in size and interval.  HOSPITAL COURSE:  #1 - PANCREATITIS:  The patient was initially admitted with severe pancreatitis by Ranson criteria.  This was further complicated by the development of necrosis and pseudocyst.  A surgery consult was obtained and the surgeon considered debridement.  However, her pancreatitis slowly improved over the course of the hospitalization.  She had significant abdominal pain throughout her hospitalization and this was managed mostly with narcotic medication.  Approximately one week into the hospitalization, her clinical status rapidly deteriorated and she developed respiratory distress.  At that time, the patient was made NPO and transferred to the intensive care unit. She was started on TNA followed by NG tube feeds.  Her diet was very cautiously and slowly advanced once she began to improve.  The patient was treated empirically with IV antibiotics.  She continued to have elevations in  her temperature as well as white blood cell count, however, her blood cultures remained negative.  Her laboratory  data continued to improve and on September 07, 2001, her AST had decreased to 29, her ALT was 30, Alk phos was 97, total bilirubin 0.4, amylase 105 and her lipase 25.  #2 - CHOLECYSTITIS:  Gallstones were suspected to be the cause of her pancreatitis.  The surgeons followed Ms. Prewitt throughout the course of her hospitalization and they considered removing her gallbladder prior to discharge, but decided to arrange this as an outpatient.  The patient will need a cholecystectomy with pancreatic pseudocyst drainage.  #3 - DIABETES MELLITUS TYPE 2:  Her blood glucose was monitored closely.  She was managed with sliding scale insulin and Lantus.  #4 - HYPERTENSION:  Her blood pressures were labile throughout the hospitalization.  When the patient was not taken the p.o., her blood pressure was managed mostly with clonidine patches and IV labetalol.  We resumed her home blood pressure medications once she began taking p.o.  #5 - DECONDITIONING:  Physical therapy was consulted to work with the patient prior to discharge.  The patient continued to improve and do well.  By  the day of discharge, she was ambulating on her own without difficulty.  DISPOSITION:  Discharged to home.  CONDITION ON DISCHARGE:  Stable, but guarded condition.  ACTIVITY:  Light.  SPECIAL INSTRUCTIONS:  Arrangements were made for home health nurse as well as home health physical therapist to monitor her status and assist with diabetic teaching.  She was advised to call her physician or return to the emergency room if she developed fever, chills, persistent nausea or vomiting, severe abdominal pain or distention.  DIET:  Low fat, diabetic, small frequent meals.  FOLLOWUP:  Follow-up appointment with Dr. Luan Pulling was scheduled for September 28, 2001, at 2:45 p.m.  Hospital follow-up appointment scheduled at [facility] for September 26, 2001, at 1:30 p.m.  A follow-up appointment was scheduled with Dr.  Jonah Blue on October 07, 2001, at 1:30 p.m.  Dictated by:   Ellwood Handler, M.D. Attending Physician:  Armanda Magic DD:  11/04/01 TD:  11/05/01 Job: [id number]

## 2010-10-31 NOTE — H&P (Signed)
NAMELICHELLE, VIETS               ACCOUNT NO.:  [id number]   MEDICAL RECORD NO.:  [id number]          PATIENT TYPE:  INP   LOCATION:  2304                         FACILITY:  [facility]   PHYSICIAN:  Guadalupe Maple, M.D.  DATE OF BIRTH:  26-Sep-1950   DATE OF ADMISSION:  07/17/2006  DATE OF DISCHARGE:                              HISTORY & PHYSICAL   PROCEDURE:  Intraoperative transesophageal echocardiography.   [name] is a 60 year old African-American female who has  unstable angina and 3-vessel coronary disease, who is scheduled to  undergo coronary artery bypass grafting by Dr. Kathlee Nations Trigt.  The  patient was also noted to have moderate to severe left ventricular  dysfunction.  Intraoperative transesophageal echocardiography was  requested to evaluate the left ventricular function and to determine if  any valvular pathology was present and to assess left ventricular volume  status.   Dictation ended at this point.           ______________________________  Guadalupe Maple, M.D.     DCJ/MEDQ  D:  07/26/2006  T:  07/27/2006  Job:  [id number]

## 2010-10-31 NOTE — Consult Note (Signed)
[name], GRIMM NO.:  [id number]   MEDICAL RECORD NO.:  [id number]          PATIENT TYPE:  INP   LOCATION:  3705                         FACILITY:  [facility]   PHYSICIAN:  Kerin Perna, M.D.  DATE OF BIRTH:  1950/06/28   DATE OF CONSULTATION:  07/23/2006  DATE OF DISCHARGE:                                 CONSULTATION   CARDIOTHORACIC SURGICAL CONSULTATION   PHYSICIAN REQUESTING CONSULTATION:  Armanda Magic, MD   PRIMARY CARE PHYSICIAN:  [facility]   CONSULTANT:  Kerin Perna, MD   REASON FOR CONSULTATION:  Severe three vessel coronary artery disease  with Class IV unstable angina and reduced LV function.   CHIEF COMPLAINT:  Epigastric and chest pain.   HISTORY OF PRESENT ILLNESS:  I was asked to evaluate this 60 year old  black female for potential surgical coronary revascularization for  recently diagnosed severe three vessel coronary artery disease with  reduced LV function.  She was admitted on February 2 after having  nocturnal upper abdominal lower chest pain.  She also had gas-bloat type  symptoms and stated the pain was relieved with nitroglycerin.  Her  cardiac enzymes were negative and her EKG LVH with possible lateral  ischemic changes.  Because of the history of gallstone pancreatitis  requiring cholecystectomy and drainage of a pancreatic pseudocyst in  2003, she underwent upper endoscopy by Dr. Vida Rigger which showed no  acute peptic gastric disease.  She underwent left heart cath today by  Dr. Mayford Knife which demonstrated a 90% stenosis of the LAD, 80-90% stenosis  of the circumflex, 80% stenosis of the ramus and 70-80% stenosis of the  right coronary artery mid vessel.  Her EF was 30% and left ventricular  end diastolic pressure was measured at 13 mmHg.  Because of her severe  three vessel disease with reduced LV function she was felt to be a  candidate for surgical coronary revascularization.   PAST MEDICAL  HISTORY:  1. Anemia.  2. Type 2 diabetes mellitus.  3. Hypertension.  4. History of known CHF, systolic and diastolic.  5. History of pancreatitis with a small pseudocyst in the tail of the      pancreas by recent CT scan.  6. ALLERGY TO PENICILLIN (ITCHING) AND ALLERGY TO MORPHINE (NAUSEA,      DIZZINESS AND HYPOTENSION).   CURRENT MEDICATIONS:  Digoxin 0.125 mg daily.  Norvasc 10 mg daily.  Coreg 6.25 mg b.i.d.  Lisinopril 40 mg daily.  Metformin 1 mg b.i.d.  Glipizide 10 mg b.i.d.  Insulin NPH 10 units q.h.s.  Aspirin 81 mg daily.   SOCIAL HISTORY:  The patient is a part-time Industrial/product designer.  She is with an  adult daughter.  She stopped smoking 5 years ago.  She stopped drinking  over 20 years ago.   FAMILY HISTORY:  Positive for diabetes, hypertension and coronary artery  disease.   REVIEW OF SYSTEMS:  The patient has had no significant weight loss since  2003 when she lost approximately 50 pounds after the laparotomy with a  cholecystectomy and internal drainage of the  pseudocyst.  She denies any  recent febrile illnesses.  She had bronchitis in October of last year  but no pulmonary symptoms since then.  She has had no bleeding  diaphysis, although she does have a chronic anemia with an admission  hematocrit of 33.  She denies thyroid disease but does have controlled  diabetes.  She denies history of DVT, claudication or TIA.  She denies  stroke or seizure.  There is no history of significant skeletal trauma  or thoracic trauma.  Otherwise the review of systems is negative.   PHYSICAL EXAMINATION:  The patient is 5 foot 5, weighs 168 pounds.  Blood pressure 150/90, pulse 80 in sinus, saturation 98%, temperature  99.0.  General appearance is that of a pleasant, middle-aged female in  her hospital room following cardiac catheterization.  No distress.  She  is on IV heparin.  HEENT exam is normocephalic.  She is edentulous with full upper and  lower plates.  Her neck is  supple and there is no palpable mass or  carotid bruit or JVD.  Lymphatic exam reveals no palpable  lymphadenopathy in the cervical, supraclavicular or axillary region.  Breath sounds are clear and there is no chest deformity.  CARDIAC EXAM reveals a regular rhythm without S3.  There is a short I/VI  systolic ejection murmur at the right upper sternal border.  Her  abdominal exam shows a well-healed upper midline incision.  There is no  organomegaly, focal tenderness or ascites.  Extremities reveal no  clubbing, cyanosis or edema.  Peripheral pulses are 2+ in the upper  extremities, non-palpable in the pedal regions but the feet are warm.  There is no evidence of venous insufficiency of the lower extremities.  Her Doppler studies and ABI exam is pending.  Her neurologic exam is  alert and oriented without focal motor deficit.   LABORATORY DATA:  I have reviewed the chest x-ray, CT of the abdomen and  left heart cath.  She has severe multivessel coronary disease with  reduced EF of 30% and a small pancreatic pseudocyst on the tail of the  pancreas.  Her chest x-ray reveals cardiomegaly with minimal pleural  effusion.  Her creatinine and BUN are within normal limits and her  hematocrit is 33.  LFTs are within normal limits.   IMPRESSION AND PLAN:  The patient would benefit from surgical  revascularization with bypass grafts to the LAD, ramus, OM and right  coronary vessels.  I have discussed the procedure in detail with the  patient and her daughter and she understands and agrees to proceed with  surgery scheduled for Monday February 11.  Thank you for the  consultation.      Kerin Perna, M.D.  Electronically Signed     PV/MEDQ  D:  07/23/2006  T:  07/23/2006  Job:  [id number]   cc:   [facility]  ? Office

## 2010-10-31 NOTE — Op Note (Signed)
[facility]  Patient:    RAYLI, WIEDERHOLD Visit Number: [id number] MRN: [id number]          Service Type: SUR Location: MICU 2105 [id number] Attending Physician:  Vikki Ports. Dictated by:   Earna Coder, M.D. Proc. Date: 11/21/01 Admit Date:  11/21/2001   CC:         Armanda Magic, M.D.   Operative Report  PREOPERATIVE DIAGNOSIS:  History of biliary pancreatitis, cholelithiasis, pancreatic pseudocyst.  POSTOPERATIVE DIAGNOSIS:  History of biliary pancreatitis, cholelithiasis, pancreatic pseudocyst.  OPERATION PERFORMED:  Open cholecystectomy and cyst gastrostomy.  SURGEON:  Stephenie Acres, M.D.  ASSISTANT:  [name], M.D.  ANESTHESIA:  General.  DESCRIPTION OF PROCEDURE:  The patient was taken to the operating room and placed in supine position.  After adequate anesthesia was induced using endotracheal tube, the abdomen was prepped and draped in normal sterile fashion.  Using an upper vertical midline incision, I dissected down to the fascia.  The fascia was opened.  There was a significant amount of free fluid in the abdomen which was aspirated.  Flimsy adhesions to the anterior abdominal wall were taken down.  The gallbladder was identified, grasped with a Kelly clamp and retracted anteriorly.  The infundibulum was grasped.  The cystic duct was easily identified, dissected free, triply clipped and divided. The cystic artery was also identified, triply clipped and divided.  The gallbladder was taken off the gallbladder bed using Bovie electrocautery.  I then turned my attention to the stomach and the pseudocyst in the lesser sac.  It was easily palpable and therefore a small gastrostomy in the anterior portion of the stomach was made.  The posterior mucosa in the stomach was identified and a 2 cm incision using electrocautery was made there until we entered the cyst.  The cyst wall measured about 5 mm and  had good integrity. A cyst gastrostomy anastomosis was made measuring about 2 cm after all fluid was aspirated.  Approximately 2L of fluid was removed.  Anastomosis was completed with a running 2-0 Prolene suture.  The anterior gastrotomy was closed with a TA-90 stapling device.  Adequate hemostasis was ensured.  The fascia was closed with a running #1 Novofil.  The skin was closed with staples.   The patient tolerated the procedure well and went to PACU in good condition. Dictated by:   Earna Coder, M.D. Attending Physician:  Vikki Ports DD:  11/21/01 TD:  11/23/01 Job: [id number]

## 2010-10-31 NOTE — Discharge Summary (Signed)
[name], [place]                           ACCOUNT NO.:  [id number]   MEDICAL RECORD NO.:  [id number]                   PATIENT TYPE:   LOCATION:                                       FACILITY:   PHYSICIAN:  Vikki Ports, M.D.         DATE OF BIRTH:   DATE OF ADMISSION:  11/21/2001  DATE OF DISCHARGE:  11/28/2001                                 DISCHARGE SUMMARY   ADMISSION DIAGNOSES:  1. Cholelithiasis.  2. Pseudocyst.  3. Congestive heart failure.  4. Primary cardiomyopathy.  5. Noninsulin-dependent diabetes mellitus.  6. Hypertension.   BRIEF HISTORY:  The patient is a 60 year old black female who had been  discharged about six weeks prior to this admission after a prolonged  hospitalization for pancreatitis.  The patient had severe necrotizing  pancreatitis but never showed evidence of true necrosis or growth from  multiple aspirates.  The patient was sent home in the interim and is  tolerating enteral nutrition and returned now for definitive open  cholecystectomy and assist gastrostomy.   For the remainder of the H&P, please see the chart.  The patient was  admitted and underwent cholecystectomy and assist gastrostomy.  Postoperatively, she did well.  She had her NG tube in place.  She had some  discomfort beyond the Dilaudid dose and she was changed to Demerol.  She was  monitored in the intensive care unit for about 24 hours under the guidance  of Dr. Cornelious Bryant. Turner.  PA catheter was removed on postoperative day one.  She was gingerly hydrated, had good pain control, and NG tube was removed on  postoperative day two and she was transferred to the floor.   She was started on clear liquid on postoperative day three.  She was passing  gas.  She had a little bit of gastroparesis which required [place] going slow  with her diet.  She also had some mild sinus tachycardia, for which Dr.  Cornelious Bryant. Turner saw her and started her on Lopressor.   By postoperative day  seven, she was tolerating a full liquid diet.  She had  mild calcemia that was treated with IV potassium chloride and by  postoperative day eight was ready to be discharged home.   CONDITION ON DISCHARGE:  Good and improved.   DISCHARGE MEDICATIONS:  1. Glucotrol 10 mg q.d.  2. Lisinopril 40 mg q.d.  3. Felodipine 10 mg q.d.  4. Aspirin 81 mg q.d.  5. Spironolactone 25 mg q.d.  6.     Lopressor 25 mg q.d.  7. Vicodin 1-2 p.o. q.4h. p.r.n. pain.   DISCHARGE INSTRUCTIONS:  Follow up is with me in one week for routine  surgical follow-up.  Vikki Ports, M.D.    KRH/MEDQ  D:  02/07/2002  T:  02/09/2002  Job:  [id number]   cc:   Gloris Manchester R. Mayford Knife, M.D.  [address], [address]  [place]  [place] [address]  Fax: [phone]   Santiago Bumpers. Hensel, M.D.  [address] [place]  [place] [address]  Fax: [phone]

## 2010-10-31 NOTE — Discharge Summary (Signed)
[name], [name]                           ACCOUNT NO.:  [id number]   MEDICAL RECORD NO.:  [id number]                   PATIENT TYPE:  INP   LOCATION:  5703                                 FACILITY:  [facility]   PHYSICIAN:  Traci R. Mayford Knife, M.D.               DATE OF BIRTH:  06-22-1950   DATE OF ADMISSION:  12/13/2001  DATE OF DISCHARGE:  12/15/2001                                 DISCHARGE SUMMARY   PROCEDURES:  A.  (10/26/01)  Cardiac catheterization revealing 70% LAD, 60%  CFX, EF 30% with 1-2+ MR.   DISCHARGE DIAGNOSES:  1. Coronary artery heart disease.     A. (10/26/01)  Coronary angiography by Dr. Carolanne Grumbling revealing 70%        left anterior descending, 60% CFX.  On aspirin and Plavix.  Plan is        for medical therapy.  Dr. Kathlee Nations Trigt of [facility] was        consulted.  He recommended percutaneous intervention of left anterior        descending if it was felt necessary that she needed improved coronary        perfusion prior to laparotomy.  2. Left ventricular dysfunction with ejection fraction 30% and 1 to 2+     mitral regurgitation.  Medical therapy with ACE inhibitor, Aldactone,     hydrochlorothiazide.  No plans for beta blocker secondary to access     bradycardia.  3. Second degree arteriovenous block, occasional two-second pause, Mobitz I     and II, usually nocturnal, asymptomatic during hospital course.  Planned     followup with Dr. Mayford Knife.  4. Episodic sinus tachycardia, rate 120s to 150s, in the setting of     nausea/emesis.  5. Nausea/emesis questionably related to pancreatic pseudocyst, improved     somewhat during course of admission with Mylanta and Phenergan as well as     Protonix.  6. Pancreatic pseudocyst needs resection after cardiac clearance.  7. Hypokalemia with serum potassium 3.5 questionably related to diuretic and     emesis; was supplemented.  Serum potassium as low as 3.3/3.5 at time of     discharge.  8. Hypertension improved  on hydrochlorothiazide, Norvasc, Aldactone and ACE     inhibitor.   PLAN:  1. The patient is discharged home in stable condition.  2. Discharge medications:     A. (New) Hydrochlorothiazide 12.5 mg p.o. q.d.     B. Glucotrol 10 mg p.o. q.d.     C. Lisinopril 40 mg p.o. q.d.     D. Plendil 10 mg p.o. q.d. (felodipine).     E. Enteric coated aspirin 81 mg p.o. q.d.     F. Phenergan 25 mg p.r.n. nausea.     G. Tylenol No. 3 as needed p.r.n.     H. (New) Aldactone (spironolactone) 15 mg p.o. q.d. from 25 mg  a day.        A. (New)  Nitroglycerin tablet 0.4 sublingual p.r.n. chest pain up to           3 tabs in 15 minutes.     I. Protonix 40 mg p.o. q.d.  3. Diet:  Low-fat, low-salt, low-cholesterol diabetic diet.  4. Wound care:  May shower.  5. Special instructions:  Stop Coreg.   FOLLOWUP:  Dr. Carolanne Grumbling, Monday, June 2nd at 2:30 p.m.   CONSULTANTS:  Dr. Kathlee Nations Trigt.   HISTORY OF PRESENT ILLNESS:  The patient is a pleasant 60 year old black  female who was hospitalized March 6th to April 7th of this year with severe  abdominal pain and vomiting, and was found to pancreatitis and  cholecystitis.  She subsequently developed pancreatic cysts.  She was  evaluated in clinic for preop surgical clearance prior to removal of  pseudocysts and cholecystectomy.   The patient was complaining of palpitations so Holter monitor was obtained  prior to admission which showed several pauses.  This was after increasing  Coreg 25 mg a day which resolved her palpitations.  Subsequent stress  Cardiolite was abnormal.  Thus she was referred for admission for coronary  angiography.   On 10/26/01 she underwent coronary angiography revealing 30% LAD, 60%  proximal CFX.  It was reviewed by Dr. Corliss Marcus who felt medical therapy  was appropriate.   During her course of admission the patient was noted with episodic Mobitz 1  and 2 two with long as 0.24 seconds.  Heart rate was 20-30's,  nonsustained.  Coreg was discontinued and she had improvement of bradycardia but only had  occasional two second pauses of Mobitz 1 and 2 which was asymptomatic.   Her medicines were adjusted to treat cardiomyopathy; addition of  hydrochlorothiazide with increase of Aldactone and continuation of the  Lisinopril and aspirin as prior to admission.  She was instructed to stop  her Coreg secondary to excessive bradycardia.  _______________ hospital  admission as above.   PAST MEDICAL HISTORY:  1. Diabetes mellitus.  2. Hypertension.  3. History of CHF.  4. Prior hospitalization March 6th to April 7th for acute pancreatitis with     diagnosis of pancreatic pseudocyst and cholelithiasis.  5. History of tubal ligation in 1972, vaginal hysterectomy in 1975.   LABORATORY TESTS:  WBC 6.5, hemoglobin 10.6, hematocrit 31.3, platelets 320.  Sodium 139, K 2.3;  3.5 at time of discharge, chloride 104, CO2 26, glucose  131; 135 at time of discharge, BUN 8, creatinine 0.6, calcium 9.2, and  magnesium 1.8.     Salomon Fick, N.P.                       Traci R. Mayford Knife, M.D.    MES/MEDQ  D:  01/11/2002  T:  01/17/2002  Job:  [id number]   cc:   Jonah Blue, M.D.

## 2010-10-31 NOTE — Op Note (Signed)
[name], [name]               ACCOUNT NO.:  [id number]   MEDICAL RECORD NO.:  [id number]          PATIENT TYPE:  INP   LOCATION:  3705                         FACILITY:  [facility]   PHYSICIAN:  Petra Kuba, M.D.    DATE OF BIRTH:  02/26/1951   DATE OF PROCEDURE:  07/21/2006  DATE OF DISCHARGE:                               OPERATIVE REPORT   PROCEDURE:  EGD.   INDICATIONS:  Atypical chest pain and upper tract symptoms.  Consent was  signed after risks, benefits, methods, options thoroughly discussed by  both myself and the PA.   MEDICINES USED:  75 mcg of fentanyl, Versed 7 mg.   PROCEDURE:  The video endoscope was inserted by direct vision.  Esophagus looked normal.  No signs of esophagitis or hiatal hernia.  Scope passed into the stomach and advanced to the antrum and advanced  through a normal pylorus into a normal duodenal bulb around the C-loop  to a normal second portion of the duodenum.  Scope was withdrawn back to  the bulb and a good look there ruled out abnormalities in that location.  Scope was withdrawn back to the stomach and retroflexed.  Angularis,  cardia, fundus, lesser and greater curve were normal, except for greater  curve folds being slightly edematous.  The scope was straightened and  straight visualization of the stomach seemed to show the previous cyst  gastrostomy scar along the lesser curve at the junction of the lesser  curve and the antrum.  No obvious drainage was seen.  The surgery seemed  to close it up.  The mucosa around this area was lighter than the rest  of the stomach.  No other abnormality was seen in the stomach on slow  withdrawal.  Air was suctioned, scope slowly withdrawn.  Again a good  look at the esophagus was normal.  Scope was removed.  The patient  tolerated the procedure well.  There was no obvious immediate  complication.   ENDOSCOPIC DIAGNOSES:  1. Probable scar in the lesser curve from her previous cyst      gastrostomy  2. Slightly edematous folds in the stomach.  3. Otherwise normal EGD without obvious explanation of her symptoms.   PLAN:  Recheck a CT to evaluate the cyst if this was not done recently  at the [place].  Otherwise continue pump inhibitors.  Down the road when  stable will need an outpatient colonoscopy for screening and her chronic  constipation.           ______________________________  Petra Kuba, M.D.     MEM/MEDQ  D:  07/21/2006  T:  07/21/2006  Job:  [id number]   cc:   Armanda Magic, M.D.

## 2010-10-31 NOTE — Discharge Summary (Signed)
   NAME:  [name], [name]                           ACCOUNT NO.:  [id number]   MEDICAL RECORD NO.:  [id number]                  PATIENT TYPE:   LOCATION:                                       FACILITY:   PHYSICIAN:  Vikki Ports, M.D.         DATE OF BIRTH:   DATE OF ADMISSION:  11/21/2001  DATE OF DISCHARGE:  11/28/2001                                 DISCHARGE SUMMARY   ADMISSION DIAGNOSES:  1. History of cholelithiasis and pancreatitis with pancreatic pseudocyst.  2. Non-insulin dependent diabetes mellitus.  3. Hypertension.  4. Congestive heart failure.  5. Idiopathic dilated cardiomyopathy.   DISCHARGE DIAGNOSES:  1. History of cholelithiasis and pancreatitis with pancreatic pseudocyst.  2. Non-insulin dependent diabetes mellitus.  3. Hypertension.  4. Congestive heart failure.  5. Idiopathic dilated cardiomyopathy.   CONDITION ON DISCHARGE:  Good and improved.   FOLLOW UP:  With me 10 days after discharge.   HISTORY OF PRESENT ILLNESS:  The patient is a 60 year old black female with  a long complicated hospitalization two months prior to this admission for  acute pancreatitis.  The patient developed pancreatic pseudocyst and is  having a lot of early satiety and upper GI symptoms and presents now for  definitive cholecystectomy and drainage of her pancreatic pseudocyst.  For  full H&P, please see the chart.   HOSPITAL COURSE:  The patient was admitted and taken to the operating room  where she underwent cholecystectomy and cyst gastrostomy which she tolerated  very well.  She had a very routine postoperative course and was started on  PCA Dilaudid and she used Demerol.  Her NG tube was left in place for two  days.  It was removed on postoperative day #2 and she was transferred out of  the intensive care unit at the suggestion of Dr. Armanda Magic after  pulmonary artery catheter was removed.  She was started on clear liquids.  She progressed very nicely  over  the next three to four days with bowel function returning.  Antibiotics  were discharged on postoperative day #7 and she was discharged to home on  postoperative day #8.   DISPOSITION:  Discharged to home.  Follow up with me 10 days after  discharge.                                               Vikki Ports, M.D.    KRH/MEDQ  D:  02/20/2002  T:  02/21/2002  Job:  [id number]   cc:   Gloris Manchester R. Mayford Knife, M.D.  [address], [address]  [place]  [place] [address]  Fax: [phone]   [facility]

## 2010-10-31 NOTE — Cardiovascular Report (Signed)
[facility]  Patient:    [name], [name] Visit Number: [id number] MRN: [id number]          Service Type: MED Location: [id number] Attending Physician:  Armanda Magic Dictated by:   Armanda Magic, M.D. Proc. Date: 10/26/01 Admit Date:  10/26/2001 Discharge Date: 10/31/2001   CC:         Vikki Ports, M.D.   Cardiac Catheterization  PROCEDURE:  Left heart catheterization, coronary angiography, left ventriculography.  CARDIOLOGIST:  Armanda Magic, M.D.  INDICATIONS:  Abnormal Cardiolite and cardiomyopathy.  COMPLICATIONS:  None.  INTRAVENOUS ACCESS:  Via right femoral vein, 6 French sheath.  INDICATIONS:  This is a 60 year old black female who has a history of gallstone pancreatitis and has a very large pseudocyst which needs to be removed.  She had initially been complaining of palpitations and a Holter monitor was obtained which showed several pauses.  This was after her Coreg was increased to 25 mg a day.  She does have a history of diabetes mellitus and has a history of congestive heart failure diagnosed one year ago when she went to the hospital for shortness of breath.  A 2-D echocardiogram at that time showed cardiomegaly.  She did not have any further work-up.  She now presents for cardiac catheterization due to an abnormal Cardiolite scan showing moderate LV dysfunction, ejection fraction 32%, with questionable infarct versus breast attenuation in the inferior lateral, inferior apical segments.  DESCRIPTION OF PROCEDURE:  The patient is brought to the cardiac catheterization laboratory in the fasting, nonsedated state.  Informed consent was obtained.  The patient was connected to continuous heart rate and pulse oximetry monitoring and intermittent blood pressure monitoring.  The right groin was prepped and draped in a sterile fashion.  1% Xylocaine was used for local anesthesia.  Using the modified Seldinger technique, a  6 French sheath was placed in the right femoral artery.  Under fluoroscopic guidance, a 6 [place] JL4 catheter was placed in the left coronary artery.  Multiple cine films were taken in 30 degree and 40 degree LAO views.  This catheter was then exchanged over a guidewire for a 6 [place] JL4 catheter which was placed under fluoroscopic guidance into the right coronary artery.  Multiple cine films were taken in the 30 degree and 40 degree RAO views.  This catheter was the exchanged out over a guidewire for a 6 French angled pigtail catheter which was placed under fluoroscopic guidance into the left ventricle cavity.  Left ventriculography was performed in the 30 degree RAO views using a total of 30 cc of contrast at 13 cc per second.  The catheter was then pulled back across the aortic valve with no significant pressure gradient noted.  The catheter was then removed over a guidewire.  At the end of the procedure, all catheters and sheaths were removed.  Manual compression was performed until adequate hemostasis was obtained.  The patient was transferred back to her room in stable condition.  RESULTS:  The left main coronary artery is widely patent and bifurcates into a left anterior descending artery and left circumflex.  The left anterior descending descending artery gives rise to one large diagonal branch which is patent and just distal to that there is a very long 70% narrowing of the mid LAD.  The LAD does traverse to the apex and inferior apex.  The left circumflex gives rise to one very high large obtuse marginal branches which branches into three different  vessels all of which are widely patent.  There then is a 60% narrowing of the LAD before this takeoff of the second very large obtuse marginal branch which splits into two daughter branches,  ongoing circumflex versus the AV groove.  Right coronary artery is widely patent throughout its course except for a mid 40% narrowing.  It  bifurcates distally in the posterior descending artery and posterior lateral artery.  LEFT VENTRICULOGRAPHY:  Left ventriculography performed in the 30 degree RAO views using a total of 30 cc of contrast at 13 cc per second showed mildly dilated left ventricular cavity with moderate LV dysfunction, ejection fraction 30-35% with mild mitral regurgitation.  Left ventricular pressures is 156/11 mmHg.  Aortic pressure 160/81 mmHg.  ASSESSMENT: 1. One vessel obstructive coronary disease of the left anterior descending. 2. Moderate left ventricular dysfunction.  PLAN: 1. Admit to telemetry bed. 2. Review of films by Dr. Amil Amen with possible percutaneous    transluminal intervention of the left anterior descending    tomorrow. 3. The patient has a Cardiolite which shows possible old inferior    lateral inferior apical infarct with possible ischemia in that    territory.  This does correspond to that LAD region since the    LAD vessel does traverse to the apex.  Dr. Amil Amen is going to    review the Cardiolite films and make a decision as to whether we    will plan on PCI of the LAD prior to her undergoing a very large    surgery.  My concern at this time is that she would have an    episode of hypotension during her procedure, that the decreased    pressure could cause decreased perfusion across the LAD    stenosis resulting in coronary ischemia.  Further intervention    per Dr. Amil Amen. Dictated by:   Armanda Magic, M.D. Attending Physician:  Armanda Magic DD:  10/26/01 TD:  10/27/01 Job: [id number]

## 2010-11-09 ENCOUNTER — Inpatient Hospital Stay (HOSPITAL_COMMUNITY)
Admission: EM | Admit: 2010-11-09 | Discharge: 2010-11-12 | DRG: 291 | Disposition: A | Discharge status: Home / Self Care | Payer: Non-veteran care | Attending: Hospitalist | Admitting: Hospitalist

## 2010-11-09 ENCOUNTER — Emergency Department (HOSPITAL_COMMUNITY): Payer: Non-veteran care

## 2010-11-09 DIAGNOSIS — I509 Heart failure, unspecified: Secondary | ICD-10-CM | POA: Diagnosis present

## 2010-11-09 DIAGNOSIS — E119 Type 2 diabetes mellitus without complications: Secondary | ICD-10-CM | POA: Diagnosis present

## 2010-11-09 DIAGNOSIS — D509 Iron deficiency anemia, unspecified: Secondary | ICD-10-CM | POA: Diagnosis present

## 2010-11-09 DIAGNOSIS — E876 Hypokalemia: Secondary | ICD-10-CM | POA: Diagnosis present

## 2010-11-09 DIAGNOSIS — N2581 Secondary hyperparathyroidism of renal origin: Secondary | ICD-10-CM | POA: Diagnosis present

## 2010-11-09 DIAGNOSIS — D631 Anemia in chronic kidney disease: Secondary | ICD-10-CM | POA: Diagnosis present

## 2010-11-09 DIAGNOSIS — N186 End stage renal disease: Secondary | ICD-10-CM | POA: Diagnosis present

## 2010-11-09 DIAGNOSIS — I5023 Acute on chronic systolic (congestive) heart failure: Secondary | ICD-10-CM

## 2010-11-09 DIAGNOSIS — I2510 Atherosclerotic heart disease of native coronary artery without angina pectoris: Secondary | ICD-10-CM | POA: Diagnosis present

## 2010-11-09 DIAGNOSIS — I2589 Other forms of chronic ischemic heart disease: Secondary | ICD-10-CM | POA: Diagnosis present

## 2010-11-09 DIAGNOSIS — Z794 Long term (current) use of insulin: Secondary | ICD-10-CM

## 2010-11-09 DIAGNOSIS — Z951 Presence of aortocoronary bypass graft: Secondary | ICD-10-CM

## 2010-11-09 LAB — BASIC METABOLIC PANEL
BUN: 53 mg/dL — ABNORMAL HIGH (ref 6–23)
CO2: 21 mEq/L (ref 19–32)
Calcium: 9.9 mg/dL (ref 8.4–10.5)
Chloride: 103 mEq/L (ref 96–112)
Creatinine, Ser: 5.57 mg/dL — ABNORMAL HIGH (ref 0.4–1.2)
GFR calc Af Amer: 9 mL/min — ABNORMAL LOW (ref >60)
GFR calc non Af Amer: 8 mL/min — ABNORMAL LOW (ref >60)
Glucose, Bld: 55 mg/dL — ABNORMAL LOW (ref 70–99)
Potassium: 3.4 mEq/L — ABNORMAL LOW (ref 3.5–5.1)
Sodium: 141 mEq/L (ref 135–145)

## 2010-11-09 LAB — CBC
Platelets: 152 10*3/uL (ref 150–400)
RBC: 3.45 MIL/uL — ABNORMAL LOW (ref 3.87–5.11)
RDW: 16.1 % — ABNORMAL HIGH (ref 11.5–15.5)
WBC: 9.7 10*3/uL (ref 4.0–10.5)

## 2010-11-09 LAB — DIFFERENTIAL
Basophils Relative: 0 % (ref 0–1)
Eosinophils Absolute: 0.1 10*3/uL (ref 0.0–0.7)
Eosinophils Relative: 1 % (ref 0–5)
Neutrophils Relative %: 82 % — ABNORMAL HIGH (ref 43–77)

## 2010-11-09 LAB — CK TOTAL AND CKMB (NOT AT ARMC)
Relative Index: INVALID (ref 0.0–2.5)
Total CK: 55 U/L (ref 7–177)

## 2010-11-09 LAB — PRO B NATRIURETIC PEPTIDE: Pro B Natriuretic peptide (BNP): 41341 pg/mL — ABNORMAL HIGH (ref 0–125)

## 2010-11-09 LAB — PROTIME-INR
INR: 1.08 (ref 0.00–1.49)
Prothrombin Time: 14.2 seconds (ref 11.6–15.2)

## 2010-11-10 ENCOUNTER — Inpatient Hospital Stay (HOSPITAL_COMMUNITY): Payer: Non-veteran care

## 2010-11-10 LAB — POCT I-STAT 3, ART BLOOD GAS (G3+)
Acid-base deficit: 1 mmol/L (ref 0.0–2.0)
Bicarbonate: 23.6 mEq/L (ref 20.0–24.0)
O2 Saturation: 68 %
O2 Saturation: 94 %
TCO2: 23 mmol/L (ref 0–100)
TCO2: 25 mmol/L (ref 0–100)
pCO2 arterial: 32.7 mmHg — ABNORMAL LOW (ref 35.0–45.0)
pCO2 arterial: 33.8 mmHg — ABNORMAL LOW (ref 35.0–45.0)
pH, Arterial: 7.453 — ABNORMAL HIGH (ref 7.350–7.400)
pO2, Arterial: 69 mmHg — ABNORMAL LOW (ref 80.0–100.0)

## 2010-11-10 LAB — GLUCOSE, CAPILLARY
Glucose-Capillary: 108 mg/dL — ABNORMAL HIGH (ref 70–99)
Glucose-Capillary: 167 mg/dL — ABNORMAL HIGH (ref 70–99)
Glucose-Capillary: 116 mg/dL — ABNORMAL HIGH (ref 70–99)

## 2010-11-10 LAB — IRON AND TIBC
Saturation Ratios: 10 % — ABNORMAL LOW (ref 20–55)
TIBC: 308 ug/dL (ref 250–470)
UIBC: 276 ug/dL

## 2010-11-10 LAB — COMPREHENSIVE METABOLIC PANEL
ALT: 9 U/L (ref 0–35)
AST: 13 U/L (ref 0–37)
Alkaline Phosphatase: 66 U/L (ref 39–117)
CO2: 24 mEq/L (ref 19–32)
Chloride: 102 mEq/L (ref 96–112)
GFR calc Af Amer: 9 mL/min — ABNORMAL LOW (ref >60)
GFR calc non Af Amer: 8 mL/min — ABNORMAL LOW (ref >60)
Sodium: 140 mEq/L (ref 135–145)
Total Bilirubin: 0.4 mg/dL (ref 0.3–1.2)

## 2010-11-10 LAB — CBC
HCT: 26.4 % — ABNORMAL LOW (ref 36.0–46.0)
Hemoglobin: 9.2 g/dL — ABNORMAL LOW (ref 12.0–15.0)
MCH: 28.5 pg (ref 26.0–34.0)
MCHC: 34.8 g/dL (ref 30.0–36.0)
RDW: 16.2 % — ABNORMAL HIGH (ref 11.5–15.5)

## 2010-11-10 LAB — LIPID PANEL
Total CHOL/HDL Ratio: 1.9 RATIO
VLDL: 21 mg/dL (ref 0–40)

## 2010-11-10 LAB — MRSA PCR SCREENING: MRSA by PCR: POSITIVE — AB

## 2010-11-10 LAB — TSH: TSH: 1.31 u[IU]/mL (ref 0.350–4.500)

## 2010-11-10 LAB — VITAMIN B12: Vitamin B-12: 635 pg/mL (ref 211–911)

## 2010-11-10 LAB — TROPONIN I: Troponin I: <0.3 ng/mL (ref <0.30)

## 2010-11-10 LAB — FERRITIN
Ferritin: 859 ng/mL — ABNORMAL HIGH (ref 10–291)
Ferritin: 808 ng/mL — ABNORMAL HIGH (ref 10–291)

## 2010-11-10 LAB — CK TOTAL AND CKMB (NOT AT ARMC): CK, MB: 2.7 ng/mL (ref 0.3–4.0)

## 2010-11-11 DIAGNOSIS — I509 Heart failure, unspecified: Secondary | ICD-10-CM

## 2010-11-11 LAB — RENAL FUNCTION PANEL
Albumin: 2.9 g/dL — ABNORMAL LOW (ref 3.5–5.2)
CO2: 26 mEq/L (ref 19–32)
Calcium: 9 mg/dL (ref 8.4–10.5)
GFR calc Af Amer: 10 mL/min — ABNORMAL LOW (ref >60)
GFR calc non Af Amer: 9 mL/min — ABNORMAL LOW (ref >60)
Phosphorus: 3.8 mg/dL (ref 2.3–4.6)
Sodium: 141 mEq/L (ref 135–145)

## 2010-11-11 LAB — CBC
Hemoglobin: 8.3 g/dL — ABNORMAL LOW (ref 12.0–15.0)
MCHC: 32.5 g/dL (ref 30.0–36.0)

## 2010-11-11 LAB — PRO B NATRIURETIC PEPTIDE: Pro B Natriuretic peptide (BNP): 38500 pg/mL — ABNORMAL HIGH (ref 0–125)

## 2010-11-12 LAB — BASIC METABOLIC PANEL
BUN: 51 mg/dL — ABNORMAL HIGH (ref 6–23)
CO2: 24 mEq/L (ref 19–32)
Calcium: 9.7 mg/dL (ref 8.4–10.5)
GFR calc non Af Amer: 8 mL/min — ABNORMAL LOW (ref >60)
Glucose, Bld: 93 mg/dL (ref 70–99)
Sodium: 140 mEq/L (ref 135–145)

## 2010-11-12 LAB — CBC
HCT: 28.5 % — ABNORMAL LOW (ref 36.0–46.0)
Hemoglobin: 9.5 g/dL — ABNORMAL LOW (ref 12.0–15.0)
MCH: 27.9 pg (ref 26.0–34.0)
MCHC: 33.3 g/dL (ref 30.0–36.0)
MCV: 83.6 fL (ref 78.0–100.0)
RDW: 16 % — ABNORMAL HIGH (ref 11.5–15.5)

## 2010-11-12 LAB — GLUCOSE, CAPILLARY

## 2010-11-13 NOTE — Discharge Summary (Signed)
  NAMENEELEY, SEDIVY               ACCOUNT NO.:  [id number]  MEDICAL RECORD NO.:  [id number]           PATIENT TYPE:  I  LOCATION:  6708                         FACILITY:  [facility]  PHYSICIAN:  Adren Dollins, MD      DATE OF BIRTH:  02-07-1951  DATE OF ADMISSION:  11/09/2010 DATE OF DISCHARGE:  11/12/2010                              DISCHARGE SUMMARY   DISCHARGE DISPOSITION:  Home.  DISCHARGE DIAGNOSES: 1. Volume overload. 2. End-stage renal disease, on peritoneal dialysis. 3. Ischemic cardiomyopathy with ejection fraction of 25%. 4. Diabetes mellitus type 2. 5. Secondary hyperparathyroidism. 6. Anemia of chronic disease.  DISCHARGE MEDICATIONS:  The patient will continue on the home medications prior to admission.  CONSULTANTS: 1. Cardiology. 2. Nephrology.  HOSPITAL COURSE:  Briefly, [name] is a 60 year old African American female with history of ischemic cardiomyopathy, EF of 25%; coronary artery disease, status post bypass with LIMA to LAD; diabetes mellitus type 2, insulin required as well as hypertension; hyperlipidemia; and end-stage renal disease, on peritoneal dialysis __________.  She presented to ED with worsening shortness of breath and was found to be in volume overload.  She was seen in consult by Nephrology and the patient had total about 2 L removed.  Clinically, she feels very fine, and is no longer short of breath.  Treatment issues of concern were  peritoneal dialysis forever, planned renal transplant as well as __________sudden death.  Pathology was consulted.  She was offered AICD and she was explained the risks, benefits as well as alternatives.  It was explained to her that she has [place] Association Class 3 with an EF __________ sudden death.  The patient declined having any biventricular AICD placed at this time.  She is more focused on the renal transplant plans that is ongoing for her in __________ and would like to grant followup  with that.  Her vital signs are stable.  She, therefore, will be discharged in stable clinical condition.  DISCHARGE DIET:  ADA, 1800-calorie diet.  DISCHARGE ACTIVITY:  As tolerated.  DISCHARGE FOLLOWUP:  The patient will need to follow up __________ renal transplant.  The patient also need to follow up with her nephrologist as well as with her PCP, and cardiology appointment.     Sundra Aland, MD     LA/MEDQ  D:  11/12/2010  T:  11/13/2010  Job:  [id number]  Electronically Signed by Sundra Aland MD on 11/13/2010 05:11:59 PM

## 2010-11-14 ENCOUNTER — Encounter: Payer: Self-pay | Admitting: Internal Medicine

## 2010-11-14 NOTE — H&P (Signed)
[name], [name]               ACCOUNT NO.:  [id number]  MEDICAL RECORD NO.:  [id number]           PATIENT TYPE:  E  LOCATION:  [facility]                         FACILITY:  [facility]  PHYSICIAN:  Eduard Clos, MDDATE OF BIRTH:  30-May-1951  DATE OF ADMISSION:  11/09/2010 DATE OF DISCHARGE:                             HISTORY & PHYSICAL   PRIMARY CARE PHYSICIAN:  At [facility].  CHIEF COMPLAINT:  Shortness of breath.  HISTORY OF PRESENTING ILLNESS:  A 60 year old female with known history of ischemic cardiomyopathy with last EF measured was 20-25% in February 2011, with a history of CAD status post CABG status post stenting, ESRD on peritoneal dialysis, diabetes mellitus type 2, hypertension, presently complains of increasing shortness of breath over the last 24 hours.  The patient is getting short of breath on exertion and also on lying down flat.  Denies any fever or chills.  Denies any cough or phlegm.  Denies any chest pain.  The patient did get diaphoretic in the evening.  In the ER, the patient was found to have a chest x-ray compatible with CHF and the patient also has increased BNP.  The patient denies any nausea, vomiting, abdominal pain, dysuria, discharge, diarrhea.  Denies any dizziness, loss of conscious, or any focal deficit.  PAST MEDICAL HISTORY: 1. History of CAD status post CABG status post stenting. 2. History of ischemic cardiomyopathy, last EF measured was in     February 2011, showing 20-25%. 3. History of hypertension. 4. History of ESRD on peritoneal dialysis. 5. History of diabetes mellitus type 2. 6. Anemia from probably chronic kidney disease.  PAST SURGICAL HISTORY:  Has had a CABG, cardiac stent, has a peritoneal dialysis catheter in the abdomen.  Medications prior to admission which has to be verified include: 1. Aspirin. 2. Coreg 25 mg p.o. b.i.d. 3. Isosorbide dinitrate 20 mg p.o. daily. 4. Norvasc 10 mg daily. 5. NovoLog 10 units in  the morning. 6. Ranolazine 500 mg in the morning. 7. Sodium bicarbonate as needed. 8. Renagel 800 mg p.o. 3 times daily with meals.  ALLERGIES:  MORPHINE, CODEINE, LISINOPRIL, PENICILLIN.  FAMILY HISTORY:  Significant for coronary artery disease and hypertension.  SOCIAL HISTORY:  The patient quit smoking more than 10 years ago. Denies any alcohol or drug abuse.  REVIEW OF SYSTEMS:  As per history of presenting illness, nothing else significant.  PHYSICAL EXAMINATION:  GENERAL:  The patient examined at bedside, presently sitting in no acute distress.  If she lies down, she gets short of breath or when she exerts. VITAL SIGNS:  Pulse is 97 per minute, temperature 97.5, respirations 18 per minute, O2 sat 98%. HEENT:  Anicteric.  No pallor.  No discharge from ears, eyes, nose, or mouth. CHEST:  Bilateral air entry present.  No rhonchi, no crepitation. HEART:  S1, S2 heard. ABDOMEN:  Soft, nontender.  Bowel sounds heard.  There is a dialysis catheter on the left side. CNS:  The patient is alert, awake; oriented to time, place, and person. Moves upper and lower extremities, 5/5. EXTREMITIES:  Peripheral pulses felt.  No edema.  LABORATORY DATA:  EKG shows sinus rhythm with heart rate around 61 beats per minute with LBBB finding.  It is compatible to the old EKG.  Chest x- ray shows congestive heart failure with bilateral pulmonary edema and small effusions.  CBC; WBC is 9.7; hemoglobin is 9.5, hemoglobin last year was around 8.1 in February 2011; platelets 152.  PT/INR is 14.2 and 1.  Basic metabolic panel; sodium 141, potassium 3.4, chloride 103, carbon dioxide 21, glucose 55, BUN 53, creatinine 5.5, calcium 9.9.  CK is 55, MB 2.2, troponin less than 0.3, BNP is 41341.  ASSESSMENT: 1. Decompensated congestive heart failure with a history of ischemic     cardiomyopathy with last ejection fraction measured in February     2011, was 20-25%. 2. History of coronary artery disease  status post coronary artery     bypass graft status post stenting. 3. History of end-stage renal disease on peritoneal dialysis. 4. Diabetes mellitus type 2. 5. History of hypertension. 6. Anemia, probably from chronic kidney disease.  PLAN: 1. At this time, we will admit the patient to telemetry. 2. For her decompensated CHF and dialysis, I have already discussed     with the cardiologist on call, Dr. Margo Aye and also nephrologist on     call, Dr. Arlean Hopping.  Dr. Arlean Hopping has advised to take orders for     dialysis CAPD one exchange every 4 hours x24 hours and two 4.25%     for first 3 exchanges, then nephrologist with volume equal to 2000     mL and to continue the Lasix at this time 80 mg IV q.6 h., range of     24 hours and we will also follow Cardiology recommendation. 3. For her diabetes, at this time, we will keep the patient on CABG     checks with sensitive sliding scale. 4. For her hypertension, we will continue present medication and     further recommendation as condition evolves.     Eduard Clos, MD    ANK/MEDQ  D:  11/09/2010  T:  11/10/2010  Job:  [id number]  Electronically Signed by Midge Minium MD on 11/14/2010 07:43:13 AM

## 2010-11-21 NOTE — Consult Note (Signed)
[name], [name]               ACCOUNT NO.:  [id number]  MEDICAL RECORD NO.:  [id number]           PATIENT TYPE:  E  LOCATION:  [facility]                         FACILITY:  [facility]  PHYSICIAN:  Wendi Snipes, MD DATE OF BIRTH:  07-19-1950  DATE OF CONSULTATION:  11/09/2010 DATE OF DISCHARGE:                                CONSULTATION   REASON FOR CONSULTATION:  Congestive heart failure.  REQUESTING PROVIDER:  [facility].  CARDIOLOGIST:  Vilinda Boehringer, [place].  PRIMARY DOCTOR:  Dr. Lenox Ahr.  CHIEF COMPLAINT:  Shortness of breath.  HISTORY OF PRESENT ILLNESS:  This is a 60 year old African American female with history of ischemic cardiomyopathy, ejection fraction approximately 25%, coronary artery disease and end-stage renal disease on peritoneal dialysis who presents with shortness of breath and orthopnea.  She states that her symptoms have been progressive over the past month and was seen in the emergency department for similar symptoms approximately month ago and took a extra Lasix at that time, though over the past month has experienced worsening symptoms and most recently tonight experiences severe orthopnea and wheezing.  This prompted her to come to the emergency department.  Here she states that she does not have any chest pain at this time and denies weight gain or increased lower extremity edema or change in diet and has been compliant with her medications.  PAST MEDICAL HISTORY: 1. Ischemic cardiomyopathy, ejection fraction 25%. 2. Coronary artery disease, status post coronary artery bypass graft     with LIMA to the LAD, saphenous vein graft to diagonal, saphenous     vein graft to the circ, saphenous vein graft to PDA, status post     drug-eluting stent placement to the proximal and mid left anterior     descending artery. 3. Diabetes insulin-dependent. 4. Hypertension. 5. Hyperlipidemia. 6. End-stage renal disease on peritoneal dialysis since February  2012.  ALLERGIES:  CODEINE and MORPHINE.  MEDICATIONS ON ADMISSION: 1. Aspirin 81 mg daily. 2. Coreg 25 mg twice daily. 3. Imdur 20 mg daily. 4. Norvasc 10 mg daily. 5. NovoLog 10 units every morning. 6. Renagel 800 mg 3 times daily. 7. Sodium bicarb 500 mg as needed. 8. Torsemide 20 mg daily. 9. Pravachol 20 mg daily. 10.Ranexa 500 mg daily.  SOCIAL HISTORY:  She lives in [place] alone.  She is currently disabled.  She quit smoking approximately 10 years ago.  FAMILY HISTORY:  Her mother had a myocardial infarction at age 29.  REVIEW OF SYSTEMS:  All 14 systems were reviewed and were negative except as mentioned detail in HPI.  PHYSICAL EXAMINATION:  VITAL SIGNS:  Blood pressure is 153/94, respiratory rate is 18, pulse is 94, she is satting 97% on 2 L nasal cannula. GENERAL:  She is a 60 year old African American female-appearing stated age, in no acute distress. HEENT:  Moist mucous membranes.  Pupils equal, round, and reactive to light and accommodation.  Anicteric sclerae. NECK:  Approximately, 10-12 cm jugular venous pulsations.  No thyromegaly. CARDIOVASCULAR:  Regular rate and rhythm.  No murmurs, rubs or gallops. LUNGS:  Mild crackles at the bases bilaterally. ABDOMEN:  Nontender and nondistended.  Positive bowel sounds.  No masses. EXTREMITIES:  Trace lower extremity edema 2-cm. NEUROLOGIC:  Alert and oriented x3.  Cranial nerves II through XII grossly intact.  No focal neurologic deficit. SKIN:  Warm, dry and intact.  No rashes. PSYCH:  Mood and affect are appropriate.  RADIOLOGY:  Chest x-ray shows pulmonary edema with small bilateral effusions.  EKG showed normal sinus rhythm with a rate of 94 beats per minute and left bundle-branch block with a QRS of 156 msec.  LABORATORY REVIEW:  White cell count is 9.7, hematocrit is 28.1, potassium is 3.4, creatinine is 5.6, BNP is 413[address], INR is 1.08. Troponin is less than 0.3.  ASSESSMENT AND PLAN:  This is a  60 year old African American female with a history of ischemic cardiomyopathy and end-stage renal disease who presents with a CHF exacerbation. 1. Congestive heart failure.  This is acute on chronic systolic     congestive heart failure from progressive ischemic cardiomyopathy     complicated by end-stage renal disease.  We will continue to     advance her medical therapy, though she is limited due to her renal     function.  I agree with IV diuresis at this point.  She will need a     more aggressive outpatient regimen.  Additionally, she appears to     meet criteria for a biventricular ICD and will consider this as she     recovers.  She may need hemodialysis for additional fluid removal. 2. Hypertension.  We will continue the addition of hydralazine and     increase her nitrates in context of her congestive heart failure.     Wendi Snipes, MD    BHH/MEDQ  D:  11/10/2010  T:  11/10/2010  Job:  [id number]  Electronically Signed by Jim Desanctis MD on 11/21/2010 08:57:31 PM

## 2010-11-24 NOTE — Consult Note (Signed)
[name], [name]               ACCOUNT NO.:  [id number]  MEDICAL RECORD NO.:  [id number]           PATIENT TYPE:  I  LOCATION:  6708                         FACILITY:  [facility]  PHYSICIAN:  Hillis Range, MD       DATE OF BIRTH:  12/04/1950  DATE OF CONSULTATION:  11/11/2010 DATE OF DISCHARGE:                                CONSULTATION   REQUESTING PHYSICIAN:  Armanda Magic, MD  REASON FOR CONSULTATION:  Congestive heart failure and further risk stratification of sudden death.  HISTORY OF PRESENT ILLNESS:  [name] is a 60 year old female with an ischemic cardiomyopathy (ejection fraction 25%), CAD status post CABG in 2008, hypertension, New York Heart Association class III congestive heart failure, left bundle-branch block, and end-stage renal disease who was admitted with a CHF.  She reports recent progressive weight gain and shortness of breath.  Since that time, her blood pressures have also become labile.  She reports stable blood pressure previously on a stable medicine regimen.  She reports significant shortness of breath.  She denies chest pain, palpitations, dizziness, presyncope, or syncope.  She was brought to [facility] and found to have acute on chronic congestive heart failure.  She has been treated with medical therapies since that time.  Presently, she is resting comfortably and is without complaint.  PAST MEDICAL HISTORY: 1. Ischemic cardiomyopathy (ejection fraction 25%). 2. CAD, status post CABG in 2008 with subsequent PCI in June 2008. 3. Hypertension. 4. End-stage renal disease, on peritoneal dialysis. 5. Diabetes. 6. Hyperlipidemia. 7. Left bundle-branch block.  HOME MEDICATIONS:  Reviewed in the [facility].  ALLERGIES: 1. CODEINE. 2. MORPHINE. 3. PENICILLIN. 4. LISINOPRIL.  SOCIAL HISTORY:  The patient lives alone in [place].  She is a retired Engineer, civil (consulting).  She is disabled and receives most of her care from the [place], [place].  She quit smoking 10 years  ago and denies alcohol or drug use.  FAMILY HISTORY:  Notable for hypertension and coronary artery disease.  REVIEW OF SYSTEMS:  All systems reviewed and negative except as outlined in the HPI above.  PHYSICAL EXAMINATION:  VITAL SIGNS:  Blood pressure 148/90, heart rate 82, respirations 18, and sats 92% on 2 liters. GENERAL:  The patient is a well-appearing female in no acute distress. She is alert and oriented x3. HEENT:  Normocephalic and atraumatic.  Sclerae are clear.  Conjunctivae are pink.  Oropharynx is clear. NECK:  Supple.  JVP 11 cm. LUNGS:  Decreased breath sounds at the bases. HEART:  Regular rate and rhythm with a 2/6 systolic ejection murmur at the apex. GI:  Soft, nontender, and nondistended.  Positive bowel sounds. EXTREMITIES:  No clubbing or cyanosis.  She does have dependent edema. SKIN:  No ecchymoses or lacerations. MUSCULOSKELETAL:  No deformity or atrophy. PSYCH:  Euthymic mood and full affect.  EKG reveals sinus rhythm with a left bundle-branch block and a QRS duration of 156 milliseconds.  Hematocrit 25 and platelets 141.  TSH 1.3.  Hemoglobin A1c 4.8.  Potassium 3.6.  Cardiac markers are negative.  IMPRESSION:  [name] is a 60 year old female with multiple comorbidities as outlined  above.  She has an ischemic cardiomyopathy with New York Heart Association class 3 congestive heart failure and a left bundle-branch block.  In addition to this, she also has hypertension and end-stage renal disease.  She states that her blood pressure has previously with been well controlled, though recently she has had acute on chronic combined CHF with progressive worsening of hypertension.  This may be related to volume overload secondary to her renal impairment.  She meets SCD-HeFT and MADIT-II criteria for ICD implantation.  The patient and I had a long discussion today regarding risks, benefits, and alternatives to biventricular ICD implantation.  At this time, the  patient is quite clear that she does not wish to pursue biventricular ICD implantation.  She states that she has an upcoming appointment in [place] to be evaluated for renal transplantation and would like to focus on that at this time.  She also states that she has previously been receiving care from the [facility] [place] [facility] and would be interested in considering down the road a second opinion at the [place], [place] for possible biventricular ICD implantation there.  As she has no further interest in biventricular ICD implantation at this time, I think that we should continue to adjust her medications for treatment of her congestive heart failure and hypertension.  I would defer further care to Dr. Mayford Knife and see the patient as needed.     Hillis Range, MD     JA/MEDQ  D:  11/11/2010  T:  11/12/2010  Job:  [id number]  Electronically Signed by Hillis Range MD on 11/24/2010 09:19:11 AM

## 2010-12-01 ENCOUNTER — Encounter: Payer: Self-pay | Admitting: Internal Medicine

## 2010-12-01 ENCOUNTER — Ambulatory Visit (INDEPENDENT_AMBULATORY_CARE_PROVIDER_SITE_OTHER): Payer: Medicare Other | Admitting: Internal Medicine

## 2010-12-01 DIAGNOSIS — I5022 Chronic systolic (congestive) heart failure: Secondary | ICD-10-CM | POA: Insufficient documentation

## 2010-12-01 DIAGNOSIS — I10 Essential (primary) hypertension: Secondary | ICD-10-CM

## 2010-12-01 DIAGNOSIS — I255 Ischemic cardiomyopathy: Secondary | ICD-10-CM

## 2010-12-01 DIAGNOSIS — I447 Left bundle-branch block, unspecified: Secondary | ICD-10-CM

## 2010-12-01 DIAGNOSIS — I509 Heart failure, unspecified: Secondary | ICD-10-CM

## 2010-12-01 DIAGNOSIS — I2589 Other forms of chronic ischemic heart disease: Secondary | ICD-10-CM

## 2010-12-01 NOTE — Patient Instructions (Signed)
Your physician recommends that you schedule a follow-up appointment as needed  She will call us back if she decides to proceed with ICD

## 2010-12-01 NOTE — Assessment & Plan Note (Signed)
We had a long discussion today about possible BiV ICD implantation.  The patient remains quite clear that she does not wish to pursue ICD implantation at this time.  I have stressed the importance of medical compliance and close cardiology follow-up today.  She states that if she decides to have an ICD placed, that she would likely want to have it done at the [facility].  She will proceed with further cardiology consultation at this time through the [place] [facility].  I have informed her that I am happy to see her as needed should she wish to further discuss BiV ICD implantation with me.  I think that given her difficulties with medical adherence/ follow-up as well as comorbidities including end stage renal disease that she is ultimately a poor candidate for BiV ICD implantation at this time.  However, if she wishes to follow close medical follow-up, we could consider this again in the future.

## 2010-12-01 NOTE — Assessment & Plan Note (Signed)
The patient appears to be doing well presently without symptoms of ischemia. She is planned to go to the [facility] for cardiology consultation in the near future.  I have encouraged her to continue to follow with Dr Mayford Knife also, who knows that patient quite well.

## 2010-12-01 NOTE — Progress Notes (Signed)
The patient presents today for routine electrophysiology followup.  Since last being seen by me in consultation at [facility], the patient reports doing very well.  At that time, she was clear that she did not wish to pursue ICD implantation.  She subsequently went to [facility] for consultation regarding possible renal transplantation.  She states that she was told that she would be a poor candidate for renal transplant. She has decided that she wishes to have her cardiology care transferred to the [facility].  She states that she does not plan to follow up with Dr Mayford Knife.  She continues to follow with nephrology locally. Today, she denies symptoms of palpitations, chest pain, shortness of breath, orthopnea, PND, lower extremity edema, dizziness, presyncope, syncope, or neurologic sequela.  The patient feels that she is tolerating medications without difficulties and is otherwise without complaint today.   Past Medical History  Diagnosis Date  . Gallstone pancreatitis   . Coronary artery disease     s/p CABG 2008 with multiple PCIs  . Ischemic cardiomyopathy   . End stage renal disease   . Diabetes mellitus   . Hypertension   . History of nonadherence to medical treatment   . LBBB (left bundle branch block)   . Hyperlipidemia    Past Surgical History  Procedure Date  . Coronary artery bypass graft 2008    Current Outpatient Prescriptions  Medication Sig Dispense Refill  . amLODipine (NORVASC) 10 MG tablet Take 10 mg by mouth daily.        [name] aspirin 81 MG tablet Take 81 mg by mouth daily.        . Calcium Acetate, Phos Binder, (CALCIUM ACETATE PO) Take by mouth daily.        . carvedilol (COREG) 25 MG tablet Take 25 mg by mouth 2 (two) times daily with a meal.        . Cholecalciferol (VITAMIN D-3 PO) Take by mouth daily.        [name] doxazosin (CARDURA) 4 MG tablet Take 4 mg by mouth at bedtime.        . hydrALAZINE (APRESOLINE) 25 MG tablet Take 25 mg by mouth daily.        . insulin  glargine (LANTUS) 100 UNIT/ML injection Inject into the skin as directed.        . isosorbide mononitrate (IMDUR) 30 MG 24 hr tablet Take 30 mg by mouth daily.        . Multiple Vitamin (DAILY VITAMIN PO) Take by mouth daily.        . pravastatin (PRAVACHOL) 20 MG tablet Take 20 mg by mouth daily.        . ranolazine (RANEXA) 500 MG 12 hr tablet Take 500 mg by mouth daily.        . sevelamer (RENAGEL) 800 MG tablet Take 800 mg by mouth. 3 po tid       . torsemide (DEMADEX) 20 MG tablet Take 20 mg by mouth 2 (two) times daily.          Allergies  Allergen Reactions  . Codeine   . Lisinopril   . Morphine And Related     History   Social History  . Marital Status: Single    Spouse Name: N/A    Number of Children: N/A  . Years of Education: N/A   Occupational History  . Not on file.   Social History Main Topics  . Smoking status: Former Engineer, civil (consulting)  date: 06/15/2000  . Smokeless tobacco: Not on file  . Alcohol Use: No  . Drug Use: No  . Sexually Active: Not on file   Other Topics Concern  . Not on file   Social History Narrative   The patient lives alone in [place].  She is a retired Engineer, civil (consulting).  She is disabled and receives most of her care from the [place], [place].      Family History  Problem Relation Age of Onset  . Coronary artery disease Mother   . Hypertension Mother   . Diabetes Mother   . Diabetes Sister    Physical Exam: Filed Vitals:   12/01/10 1541  BP: 137/87  Pulse: 78  Resp: 18  Height: 5\' 4"  (1.626 m)  Weight: 176 lb (79.833 kg)    GEN- The patient is well appearing, alert and oriented x 3 today.   Head- normocephalic, atraumatic Eyes-  Sclera clear, conjunctiva pink Ears- hearing intact Oropharynx- clear Neck- supple, no JVP Lymph- no cervical lymphadenopathy Lungs- Clear to ausculation bilaterally, normal work of breathing Heart- Regular rate and rhythm, no murmurs, rubs or gallops, PMI not laterally displaced GI- soft, NT, ND, +  BS Extremities- no clubbing, cyanosis, or edema MS- no significant deformity or atrophy Skin- no rash or lesion Psych- euthymic mood, full affect Neuro- strength and sensation are intact  Assessment and Plan:

## 2010-12-01 NOTE — Assessment & Plan Note (Signed)
Stable No change required today  

## 2010-12-01 NOTE — Assessment & Plan Note (Signed)
Stable She declines resynchronization at this time, stating "my heart is doing fine now".

## 2011-01-12 ENCOUNTER — Inpatient Hospital Stay (HOSPITAL_COMMUNITY)
Admission: EM | Admit: 2011-01-12 | Discharge: 2011-01-15 | DRG: 291 | Disposition: A | Discharge status: Other Acute Inpatient Hospital | Payer: Non-veteran care | Attending: Internal Medicine | Admitting: Internal Medicine

## 2011-01-12 ENCOUNTER — Emergency Department (HOSPITAL_COMMUNITY): Payer: Non-veteran care

## 2011-01-12 DIAGNOSIS — J189 Pneumonia, unspecified organism: Secondary | ICD-10-CM | POA: Diagnosis present

## 2011-01-12 DIAGNOSIS — N2581 Secondary hyperparathyroidism of renal origin: Secondary | ICD-10-CM | POA: Diagnosis present

## 2011-01-12 DIAGNOSIS — Z951 Presence of aortocoronary bypass graft: Secondary | ICD-10-CM

## 2011-01-12 DIAGNOSIS — D638 Anemia in other chronic diseases classified elsewhere: Secondary | ICD-10-CM | POA: Diagnosis present

## 2011-01-12 DIAGNOSIS — I5023 Acute on chronic systolic (congestive) heart failure: Principal | ICD-10-CM | POA: Diagnosis present

## 2011-01-12 DIAGNOSIS — Z87891 Personal history of nicotine dependence: Secondary | ICD-10-CM

## 2011-01-12 DIAGNOSIS — Z66 Do not resuscitate: Secondary | ICD-10-CM | POA: Diagnosis present

## 2011-01-12 DIAGNOSIS — I4729 Other ventricular tachycardia: Secondary | ICD-10-CM | POA: Diagnosis present

## 2011-01-12 DIAGNOSIS — Z794 Long term (current) use of insulin: Secondary | ICD-10-CM

## 2011-01-12 DIAGNOSIS — E119 Type 2 diabetes mellitus without complications: Secondary | ICD-10-CM | POA: Diagnosis present

## 2011-01-12 DIAGNOSIS — I472 Ventricular tachycardia, unspecified: Secondary | ICD-10-CM | POA: Diagnosis present

## 2011-01-12 DIAGNOSIS — I2510 Atherosclerotic heart disease of native coronary artery without angina pectoris: Secondary | ICD-10-CM | POA: Diagnosis present

## 2011-01-12 DIAGNOSIS — I509 Heart failure, unspecified: Secondary | ICD-10-CM | POA: Diagnosis present

## 2011-01-12 DIAGNOSIS — I120 Hypertensive chronic kidney disease with stage 5 chronic kidney disease or end stage renal disease: Secondary | ICD-10-CM | POA: Diagnosis present

## 2011-01-12 DIAGNOSIS — Z7982 Long term (current) use of aspirin: Secondary | ICD-10-CM

## 2011-01-12 DIAGNOSIS — I059 Rheumatic mitral valve disease, unspecified: Secondary | ICD-10-CM | POA: Diagnosis present

## 2011-01-12 DIAGNOSIS — I447 Left bundle-branch block, unspecified: Secondary | ICD-10-CM | POA: Diagnosis present

## 2011-01-12 DIAGNOSIS — E785 Hyperlipidemia, unspecified: Secondary | ICD-10-CM | POA: Diagnosis present

## 2011-01-12 DIAGNOSIS — Z9861 Coronary angioplasty status: Secondary | ICD-10-CM

## 2011-01-12 DIAGNOSIS — A088 Other specified intestinal infections: Secondary | ICD-10-CM | POA: Diagnosis present

## 2011-01-12 DIAGNOSIS — N186 End stage renal disease: Secondary | ICD-10-CM | POA: Diagnosis present

## 2011-01-12 DIAGNOSIS — I2589 Other forms of chronic ischemic heart disease: Secondary | ICD-10-CM | POA: Diagnosis present

## 2011-01-12 LAB — BASIC METABOLIC PANEL
BUN: 60 mg/dL — ABNORMAL HIGH (ref 6–23)
Creatinine, Ser: 6.69 mg/dL — ABNORMAL HIGH (ref 0.50–1.10)
GFR calc Af Amer: 8 mL/min — ABNORMAL LOW (ref >60)
GFR calc non Af Amer: 6 mL/min — ABNORMAL LOW (ref >60)

## 2011-01-12 LAB — CBC
Hemoglobin: 9.4 g/dL — ABNORMAL LOW (ref 12.0–15.0)
MCV: 88.2 fL (ref 78.0–100.0)
Platelets: 171 10*3/uL (ref 150–400)
RBC: 3.21 MIL/uL — ABNORMAL LOW (ref 3.87–5.11)
WBC: 7.9 10*3/uL (ref 4.0–10.5)

## 2011-01-12 LAB — CK TOTAL AND CKMB (NOT AT ARMC)
Relative Index: INVALID (ref 0.0–2.5)
Total CK: 44 U/L (ref 7–177)

## 2011-01-12 LAB — DIFFERENTIAL
Basophils Relative: 0 % (ref 0–1)
Eosinophils Absolute: 0.2 10*3/uL (ref 0.0–0.7)
Lymphs Abs: 1.3 10*3/uL (ref 0.7–4.0)
Neutro Abs: 6 10*3/uL (ref 1.7–7.7)
Neutrophils Relative %: 76 % (ref 43–77)

## 2011-01-12 LAB — TROPONIN I: Troponin I: <0.3 ng/mL (ref <0.30)

## 2011-01-12 LAB — PRO B NATRIURETIC PEPTIDE: Pro B Natriuretic peptide (BNP): 35840 pg/mL — ABNORMAL HIGH (ref 0–125)

## 2011-01-12 NOTE — H&P (Signed)
NAMEZAYAH, KEILMAN NO.:  [id number]  MEDICAL RECORD NO.:  [id number]  LOCATION:  [facility]                         FACILITY:  [facility]  PHYSICIAN:  Pleas Koch, MD        DATE OF BIRTH:  1950/06/28  DATE OF ADMISSION:  01/12/2011 DATE OF DISCHARGE:                             HISTORY & PHYSICAL   CHIEF COMPLAINT:  Shortness of breath plus substernal chest pressure plus abdominal pain and vomiting x1  HISTORY OF PRESENT ILLNESS:  This is very pleasant 60 year old retired Engineer, civil (consulting) African American who presents to [facility] Emergency Room after waking up this morning at around 2 a.m. or 3 a.m. with shortness of breath. She claims she had some nausea and vomiting between 3 and 4 a.m. and then developed 1 episode of vomiting and then sat up at edge of the bed overnight, which she claims she took nitroglycerin pretty much continuously as she did not know what else to do and then came to emergency room at 8 a.m.  She describes her discomfort mainly as in the center of the chest with radiation under both breasts, but has significant cardiac history as mentioned below.  She does not have any diarrhea.  No fever.  No cough, no sputum.  No other further vomiting and is currently going through menopause at present where she has hot flashes.  She has been going through menopause since 2003.  She describes when she had her CABG in the past, she had right thumb and neck pain.  She also states the sensation she has currently when I see her in the ED is a full sensation in center of her chest and when attempting to clarify the same, she said it is like a chest pressure.  She had no sore feeling in her chest and the nitroglycerin that she took at home did not seem to relieve it.  She states that she has been going to the [facility] regularly and has been getting fresh bottle of nitroglycerin, so I do not suspect these are old.  In fact states that the sort of chest pressure started in  the emergency room about 3-4 hours ago.  PAST MEDICAL HISTORY:  Extensive and significant for the following, 1. Cardiology history - ischemic cardiomyopathy with 25% She has had CABG, four-vessel in February 2008 by Dr. Mayford Knife and subsequently developed LIMA to LAD septal stenosis at the anastomosis with this venous graft being completely occluded.  She had successful PTCA of this with drug-eluting stent implantation.   End-stage renal disease.  She follows Dr. Marletta Lor, at [facility] and has been undergoing peritoneal dialysis with them,     since February 2012.  She still produces urine.    She was worked up for renal transplant by the [place], however this is secondary to her low ejection fraction.    She has a history of pancreatitis in the past and possibly due to hypertension and diabetes mellitus.  She tells me she may expel 1500 mL urine every 24 hours, although she does not count it.   She has severe anemia, probably from chronic kidney disease that  has been worked up in the past by Dr. Clelia Croft.     Her last echo was on Nov 12, 2010, which showed EF of 20% to 25%     diffuse hypokinesis and mild MR.  Allergies and medications has not be reconciled, but seem to include Aranesp, aspirin, Coreg, Isordil, Norvasc, doxazosin, torsemide, hydralazine.  ALLERGIES:  Significant for, 1. CODEINE. 2. LISINOPRIL. 3. MORPHINE. 4. PENICILLIN.  FAMILY HISTORY:  Significant for CAD and hypertension.  SOCIAL HISTORY:  The patient is a former Engineer, civil (consulting), retired.  Quit smoking more 10 years ago.  Denies any alcohol or drug abuse.  REVIEW OF SYSTEMS:  As per above.  PHYSICAL EXAMINATION:  GENERAL:  The patient is a ill-appearing African American female sitting on the side of bed.  She reports some occasional chest pain.  On monitor she is tachycardic at around 105 to 110 and QTc is prolonged at 535. VITAL SIGNS:  Her blood pressures in the 160-100 range, respirations  are 20 per minute, and O2 sats 98%. HEENT:  She has no scleral icterus.  No pallor.  Throat is clinically clear. NECK:  I do not really appreciate any JVD. HEART:  S1, S2 tachycardic, possibly left upper sternal edge murmur. Point maximal impulse is displaced. CHEST:  Has a scar down the center of it consistent with her CABG, pressure over the scar does not produces pain, but not the pain that she is describing at present.  She has no rales, no crackles and no tactile vocal resonance or fremitus. ABDOMEN:  Soft.  Bowel sounds are heard, however there is tenderness to deep palpation in the left right lower quadrant. EXTREMITIES:  Soft.  I do appreciate perhaps trace edema.  I do not elicit dorsalis pedis. NEUROLOGIC:  She is intact.  She converses well and gives a full history and has a grossly normal strength.  OBJECTIVE DATA:  Chest x-ray shows cardiac enlargement, pulmonary vascular congestion, perihilar edema similar to previous study.  On review of chest x-ray personally, there does appear to be more congestion on the right side of the chest in the vascular bed.  LABORATORY DATA:  Her BNP is currently 35,840 which is consistent with prior numbers.  Sodium 137, potassium 4.8, chloride 104, CO2 of 19, glucose 99, BUN 60, creatinine 6.69, calcium 10.6.  First set of cardiac markers; CK is 44, CK-MB is 3.5, troponin is less than 0.3.  WBC's 7.9, hemoglobin 9.4, hematocrit 28.3, RDW is 18, and platelet count is 171.  EKG showed left bundle branch block with possible ST depression Rpt EKG done showed St-t depression V5-6  IMPRESSION/ASSESSMENT:  This is a 60 year old African American female coming in with history of congestive heart failure, ischemic cardiomyopathy, end-stage renal disease on hemodialysis coming in with abdominal pain versus chest pain of cardiac etiology.  1. Acute coronary syndrome.  I saw Dr. Eldridge Dace in the hallway earlier     today and he mentioned that he  thought this was more likely     abdominal.  Certainly, there is an abdominal component to her pain,     however her signs and symptoms have changed and on review of her     pain this afternoon, she is stating now that she has more chest     pain that was relieved by 1 sublingual nitro that I gave her.  This     makes me question the efficacy of the sublingual nitro that she     used at home.  Dr. Eldridge Dace has already put in orders for nitro     drip and we will continue the same.  I curbsided Dr. Donnie Aho     regarding the patient's case as I did see EKG changes, he mentions     to me that given the fact that she has an elevated BNP, this could     all be driven by her acute congestion and recommends to offload her     heart with the nitroglycerin as well as with Lasix, which I will     attempt to do.  The patient may require Lasix drip in addition to     the nitro as well as the heparin drip.  I have started her on     heparin per pharmacy protocol for acute coronary syndrome and we     will get cardiac enzymes q.6 h.  We will allow Cardiology to make a determination in the morning as to what the ultimate course of care should be.   Nephrology-Dr. Hyman Hopes has seen patient and is aware of her presence here-I updated Dr. Eliott Nine  this evening, as I wasn't clear if we could still Dialyse her and it seems Dr. Hyman Hopes has ordered Peritoneal Dialysis overnight through the staff of [id number]-We will have this done emergnetly given her likely Volume overload and Hold on the lasix previously  mentioned.   Abdominal pain.  I will get a stat CT of the abdomen without     contrast at present time to rule out peritonitis.  This seems     unlikely given the fact that she does not have an elevation of     white count or any fever, however this may be early and it would be     reasonable to rule this out.  If there is any fluid noted, it will     be reasonable also to tap this and get fluid for sampling.      Anitbiotics have been held for now until peding CT abdomen   Diabetes mellitus.  I will keep her n.p.o. for now, I will keep her     on D5 with the other drips that are going and cover her CBG q.4 h.     Overnight-as she might benefit from possible cardiac procedure in am   Long QT interval.  We will hold off on medications that can cause     arrhythmias.   Sinus tachycardia this is likely related to fluid decompensated     state which I believe may be from congestive heart failure although     I cannot clinically appreciate any elevation of JVD.  We will once     again diurese her.   Anemia.  This certainly could be driving her acute decompensation.     Her hemoglobin is 9.4, however, this is better than the prior     number of her hemoglobin, this is baseline for her. Etiology is likely 2/2 to AOCD from     ERSD  I appreciate Cardiology and Nephrology working with her. She [facility] on [facility] and reviewed in am-I have updated on-call [facility] hospialist about her condition.         ______________________________ Pleas Koch, MD     JS/MEDQ  D:  01/12/2011  T:  01/12/2011  Job:  [id number]  Electronically Signed by Pleas Koch MD on 01/12/2011 10:08:55 PM

## 2011-01-13 ENCOUNTER — Inpatient Hospital Stay (HOSPITAL_COMMUNITY): Payer: Non-veteran care

## 2011-01-13 LAB — COMPREHENSIVE METABOLIC PANEL
ALT: 13 U/L (ref 0–35)
AST: 14 U/L (ref 0–37)
Alkaline Phosphatase: 65 U/L (ref 39–117)
CO2: 20 mEq/L (ref 19–32)
Calcium: 10.3 mg/dL (ref 8.4–10.5)
Chloride: 104 mEq/L (ref 96–112)
GFR calc non Af Amer: 6 mL/min — ABNORMAL LOW (ref >60)
Potassium: 4.4 mEq/L (ref 3.5–5.1)
Sodium: 137 mEq/L (ref 135–145)
Total Bilirubin: 0.4 mg/dL (ref 0.3–1.2)

## 2011-01-13 LAB — PRO B NATRIURETIC PEPTIDE: Pro B Natriuretic peptide (BNP): >70000 pg/mL — ABNORMAL HIGH (ref 0–125)

## 2011-01-13 LAB — GLUCOSE, CAPILLARY: Glucose-Capillary: 118 mg/dL — ABNORMAL HIGH (ref 70–99)

## 2011-01-13 LAB — CBC
HCT: 26.8 % — ABNORMAL LOW (ref 36.0–46.0)
Hemoglobin: 8.8 g/dL — ABNORMAL LOW (ref 12.0–15.0)
MCH: 29 pg (ref 26.0–34.0)
MCHC: 32.8 g/dL (ref 30.0–36.0)
MCV: 88.4 fL (ref 78.0–100.0)
RBC: 3.03 MIL/uL — ABNORMAL LOW (ref 3.87–5.11)

## 2011-01-13 LAB — DIFFERENTIAL
Lymphs Abs: 1.4 10*3/uL (ref 0.7–4.0)
Monocytes Absolute: 0.6 10*3/uL (ref 0.1–1.0)
Monocytes Relative: 8 % (ref 3–12)
Neutro Abs: 5.2 10*3/uL (ref 1.7–7.7)
Neutrophils Relative %: 70 % (ref 43–77)

## 2011-01-13 LAB — CARDIAC PANEL(CRET KIN+CKTOT+MB+TROPI)
CK, MB: 3.8 ng/mL (ref 0.3–4.0)
Relative Index: INVALID (ref 0.0–2.5)
Troponin I: <0.3 ng/mL (ref <0.30)

## 2011-01-13 LAB — RENAL FUNCTION PANEL
Albumin: 3.5 g/dL (ref 3.5–5.2)
GFR calc Af Amer: 8 mL/min — ABNORMAL LOW (ref >60)
GFR calc non Af Amer: 6 mL/min — ABNORMAL LOW (ref >60)
Phosphorus: 4.7 mg/dL — ABNORMAL HIGH (ref 2.3–4.6)
Potassium: 4.7 mEq/L (ref 3.5–5.1)
Sodium: 134 mEq/L — ABNORMAL LOW (ref 135–145)

## 2011-01-13 LAB — PROTIME-INR: INR: 1.18 (ref 0.00–1.49)

## 2011-01-13 LAB — IRON AND TIBC: Saturation Ratios: 9 % — ABNORMAL LOW (ref 20–55)

## 2011-01-13 LAB — IRON: Iron: 27 ug/dL — ABNORMAL LOW (ref 42–135)

## 2011-01-14 LAB — CBC
Hemoglobin: 8.7 g/dL — ABNORMAL LOW (ref 12.0–15.0)
MCH: 28.3 pg (ref 26.0–34.0)
RBC: 3.07 MIL/uL — ABNORMAL LOW (ref 3.87–5.11)

## 2011-01-14 LAB — RENAL FUNCTION PANEL
CO2: 20 mEq/L (ref 19–32)
Calcium: 9.7 mg/dL (ref 8.4–10.5)
GFR calc Af Amer: 8 mL/min — ABNORMAL LOW (ref >60)
Glucose, Bld: 223 mg/dL — ABNORMAL HIGH (ref 70–99)
Potassium: 4.5 mEq/L (ref 3.5–5.1)
Sodium: 136 mEq/L (ref 135–145)

## 2011-01-14 LAB — MAGNESIUM: Magnesium: 2.4 mg/dL (ref 1.5–2.5)

## 2011-01-14 LAB — GLUCOSE, CAPILLARY

## 2011-01-15 LAB — CBC
HCT: 29.4 % — ABNORMAL LOW (ref 36.0–46.0)
MCHC: 32.3 g/dL (ref 30.0–36.0)
Platelets: 191 10*3/uL (ref 150–400)
RDW: 17.3 % — ABNORMAL HIGH (ref 11.5–15.5)
WBC: 9.7 10*3/uL (ref 4.0–10.5)

## 2011-01-15 LAB — RENAL FUNCTION PANEL
Albumin: 3 g/dL — ABNORMAL LOW (ref 3.5–5.2)
Chloride: 101 mEq/L (ref 96–112)
GFR calc Af Amer: 8 mL/min — ABNORMAL LOW (ref >60)
GFR calc non Af Amer: 6 mL/min — ABNORMAL LOW (ref >60)
Phosphorus: 6.1 mg/dL — ABNORMAL HIGH (ref 2.3–4.6)
Potassium: 3.5 mEq/L (ref 3.5–5.1)
Sodium: 137 mEq/L (ref 135–145)

## 2011-01-15 LAB — GLUCOSE, CAPILLARY
Glucose-Capillary: 136 mg/dL — ABNORMAL HIGH (ref 70–99)
Glucose-Capillary: 187 mg/dL — ABNORMAL HIGH (ref 70–99)

## 2011-01-15 NOTE — Discharge Summary (Signed)
NAMEMARIALENA, [name]:  [id number]  MEDICAL RECORD NO.:  [id number]  LOCATION:  6710                         FACILITY:  [facility]  PHYSICIAN:  Zannie Cove, MD     DATE OF BIRTH:  1951-02-17  DATE OF ADMISSION:  01/12/2011 DATE OF DISCHARGE:                        DISCHARGE SUMMARY - REFERRING   DISPOSITION:  [name] is being transferred to [facility].  DISCHARGE DIAGNOSES: 1. Ischemic cardiomyopathy with an EF of 25%. 2. History of coronary artery disease status post CABG x4 in February     2008 and PCI to proximal anterior descending artery the drug-     eluting stent in June 2008. 3. End-stage renal disease, on peritoneal dialysis. 4. Hypertension. 5. Type 2 diabetes. 6. Dyslipidemia. 7. Volume overload secondary to cardiomyopathy and peritoneal     dialysis. 8. Nonsustained ventricular tachycardia. 9. Anemia of chronic disease. 10.Secondary hyperparathyroidism. 11.Mild diarrhea likely viral gastroenteritis, resolving. 12.Probable right upper lobe pneumonia versus edema.  CURRENT MEDICATIONS: 1. Amlodipine 10 mg daily. 2. Aspirin 81 mg daily. 3. Carvedilol 25 mg b.i.d. 4. Aranesp 200 mcg subcu q. Tuesday. 5. Ferric gluconate complex 125 mg IV daily. 6. Hydralazine 25 mg p.o. t.i.d. 7. NovoLog sliding scale insulin. 8. Lantus 5 units subcu at bedtime. 9. Levofloxacin 250 mg IV q.48 h. 10.Ranexa 500 mg daily. 11.Renagel 3200 mg p.o. t.i.d. with meals. 12.Renal vitamin 1 tablet at bedtime. 13.Vicodin 5/500 one tablet daily p.r.n. 14.Phenergan 12.5 mg IV q.6 h p.r.n.  DIAGNOSTICS/INVESTIGATIONS: 1. Chest x-ray on January 12, 2011, cardiac enlargement, pulmonary     vascular congestion, and perihilar edema.  Repeat chest x-ray on     January 13, 2011, cardiomegaly with pulmonary edema pattern.  Interval     increased consolidation in the right upper lobe may represent     edema, atelectasis, or developing pneumonia. 2. CT  abdomen and pelvis on January 12, 2011, showed asymmetric right     lower lobe airspace disease, suspicious for pneumonia. 3. Several peripancreatic fluid collections which may represent     pancreatic pseudocyst or small collections related to peritoneal     dialysis and diverticulosis without diverticulitis.  HOSPITAL COURSE:  [name] is a 60 year old African American female on peritoneal dialysis presented to the hospital with substernal chest pressure, shortness of breath, nausea, and vomiting. 1. Substernal chest pressure.  She was ruled out for acute ACS based     on three sets of cardiac markers.  She had a the left bundle-branch     block on EKG, which was unchanged from prior EKGs back in March     2012.  She was initially treated with IV nitroglycerin and heparin     which was subsequently discontinued after she ruled out. 2. Decompensated systolic CHF.  This was secondary to her poor     compliance with PD diet, etc.  She had extra fluid removed with     peritoneal dialysis per [facility], which followed her through her     hospital stay.  Clinically improved in terms of oxygen requirements     as well as overall respiratory status with her peritoneal dialysis. 3. Ischemic cardiomyopathy with transient  nonsustained ventricular     tachycardia.  The discussion was with regards to an AICD for which     the patient is being transferred to the [facility] for     possible evaluation for this, which was primarily set up through     Dr. Mayford Knife, one of her cardiologist who was following her here in     the hospital. 4. Nausea and scant diarrhea which has improved.  The patient is     tolerating a regular diet, but did have an episode of diarrhea     today.  C diff PCR is pending. 5. Type 2 diabetes, relatively stable on Lantus and sliding scale     insulin.  CONDITION AT TIME OF TRANSFER:  Stable.  DISCHARGE VITAL SIGNS:  Temperature is 97.7, pulse 79, blood pressure 124/83,  respirations 22, satting 94% on room air.  DISCHARGE LABS:  White count of 7.1, hemoglobin 8.7, platelets 166. Chemistries; sodium 136, potassium 4.5, chloride 102, bicarb 20, BUN 60, creatinine 6.1, glucose 220.     Zannie Cove, MD     PJ/MEDQ  D:  01/14/2011  T:  01/14/2011  Job:  [id number]  Electronically Signed by Zannie Cove  on 01/15/2011 05:18:40 PM

## 2011-01-23 NOTE — Consult Note (Signed)
[name], [name]               ACCOUNT NO.:  [id number]  MEDICAL RECORD NO.:  [id number]  LOCATION:  6710                         FACILITY:  [facility]  PHYSICIAN:  Logyn Kendrick L. Ladona Ridgel, MD  DATE OF BIRTH:  Jan 01, 1951  DATE OF CONSULTATION:  01/13/2011 DATE OF DISCHARGE:                                CONSULTATION   CONSULT FOR:  Review of medical treatment options and clarification of goals of care.  CONSULT REQUESTED BY:  Pleas Koch, MD  This nurse practitioner, Lorinda Creed, reviewed medical records, received report from team, assessed the patient, and then met at the patient's bedside with the patient herself, no other family members present to discuss diagnosis, prognosis, goals of care, end-of-life wishes, disposition, and options.  PROBLEM LIST: 1. Ischemic cardiomyopathy.     a.     Acute on chronic congestive heart failure, coronary artery      disease, and EF of 25%, a CABG in 2008, weakness and dyspnea. 2. End-stage renal disease/anemia/peritoneal dialysis. 3. Hypertension. 4. Diabetes. 5. Palliative performance scale 40%.  GOALS/RECOMMENDATIONS: 1. DNR/DNI. 2. Continue with all available medical interventions to treat present     illness.     a.     Hopeful for improvement.     b.     [facility], is open to      the possibility of any medical intervention that would prolong      life, i.e. transplant. 3. Future goals of care are dependent on outcomes over the next days,     weeks, and months.     a.     This nurse practitioner encouraged the patient to secure      healthcare power of attorney.     b.     This nurse practitioner encouraged the patient to centralize      her health care.  The patient views the [place] as her primary care      provider, however, she sees a cardiologist here in [place] and      has a renal doctor in [facility].  A detailed discussion was had today regarding advanced directives, concepts  specific to code status, artificial feeding, and hydration, continued use of IV antibiotics and rehospitalization was had.  The difference between aggressive medical intervention path and a palliative comfort care path for this patient at this time in this situation was detailed.  The natural trajectory and expectations at end-of-life were discussed.  Questions and concerns were addressed.  Values and goals of care important to the patient's family were attempted to be elicited. The patient understands her overall poor prognosis, however, at this time, she wishes to continue to seek available therapies and medications that hopefully will increase the quality of her life and prolong her life.  Total time spent on the unit was 65 minutes.  Time in 11:30, time out 12:35.  Greater than 50% of the time was spent in counseling and coordination of care.  Diagnosis and prognosis was clarified.  The concept of hospice and palliative care was reviewed.  Team approach to our service was offered.  Palliative Medicine will continue to  support holistically and the patient is encouraged to call with questions or concerns.  Henrietta, case manager with Dr. Mahala Menghini is notified of the above.  Any questions or concerns regarding this consult, please call Lorinda Creed at [phone].     Herbert Pun, NP   ______________________________ Katharina Caper. Ladona Ridgel, MD    MCL/MEDQ  D:  01/13/2011  T:  01/13/2011  Job:  [id number]  cc:   Pleas Koch, MD  Electronically Signed by Lorinda Creed NP on 01/21/2011 09:01:35 AM Electronically Signed by Derenda Mis MD on 01/23/2011 08:29:21 AM

## 2011-01-31 ENCOUNTER — Emergency Department (HOSPITAL_COMMUNITY): Payer: Medicare Other

## 2011-01-31 ENCOUNTER — Encounter: Payer: Self-pay | Admitting: Internal Medicine

## 2011-01-31 ENCOUNTER — Inpatient Hospital Stay (HOSPITAL_COMMUNITY)
Admission: EM | Admit: 2011-01-31 | Discharge: 2011-02-04 | DRG: 291 | Discharge status: Against Medical Advice | Payer: Medicare Other | Attending: Internal Medicine | Admitting: Internal Medicine

## 2011-01-31 DIAGNOSIS — K861 Other chronic pancreatitis: Secondary | ICD-10-CM | POA: Diagnosis present

## 2011-01-31 DIAGNOSIS — K859 Acute pancreatitis without necrosis or infection, unspecified: Secondary | ICD-10-CM

## 2011-01-31 DIAGNOSIS — Z79899 Other long term (current) drug therapy: Secondary | ICD-10-CM

## 2011-01-31 DIAGNOSIS — J189 Pneumonia, unspecified organism: Secondary | ICD-10-CM

## 2011-01-31 DIAGNOSIS — I5023 Acute on chronic systolic (congestive) heart failure: Principal | ICD-10-CM | POA: Diagnosis present

## 2011-01-31 DIAGNOSIS — I447 Left bundle-branch block, unspecified: Secondary | ICD-10-CM | POA: Diagnosis present

## 2011-01-31 DIAGNOSIS — I120 Hypertensive chronic kidney disease with stage 5 chronic kidney disease or end stage renal disease: Secondary | ICD-10-CM | POA: Diagnosis present

## 2011-01-31 DIAGNOSIS — N186 End stage renal disease: Secondary | ICD-10-CM | POA: Diagnosis present

## 2011-01-31 DIAGNOSIS — I2510 Atherosclerotic heart disease of native coronary artery without angina pectoris: Secondary | ICD-10-CM | POA: Diagnosis present

## 2011-01-31 DIAGNOSIS — K862 Cyst of pancreas: Secondary | ICD-10-CM | POA: Diagnosis present

## 2011-01-31 DIAGNOSIS — Z9119 Patient's noncompliance with other medical treatment and regimen: Secondary | ICD-10-CM

## 2011-01-31 DIAGNOSIS — Z951 Presence of aortocoronary bypass graft: Secondary | ICD-10-CM

## 2011-01-31 DIAGNOSIS — E119 Type 2 diabetes mellitus without complications: Secondary | ICD-10-CM | POA: Diagnosis present

## 2011-01-31 DIAGNOSIS — I2589 Other forms of chronic ischemic heart disease: Secondary | ICD-10-CM | POA: Diagnosis present

## 2011-01-31 DIAGNOSIS — I509 Heart failure, unspecified: Secondary | ICD-10-CM | POA: Diagnosis present

## 2011-01-31 DIAGNOSIS — Z66 Do not resuscitate: Secondary | ICD-10-CM | POA: Diagnosis present

## 2011-01-31 DIAGNOSIS — D631 Anemia in chronic kidney disease: Secondary | ICD-10-CM | POA: Diagnosis present

## 2011-01-31 DIAGNOSIS — E785 Hyperlipidemia, unspecified: Secondary | ICD-10-CM | POA: Diagnosis present

## 2011-01-31 DIAGNOSIS — Z91199 Patient's noncompliance with other medical treatment and regimen due to unspecified reason: Secondary | ICD-10-CM

## 2011-01-31 LAB — DIFFERENTIAL
Basophils Absolute: 0 10*3/uL (ref 0.0–0.1)
Basophils Relative: 0 % (ref 0–1)
Eosinophils Absolute: 0.1 10*3/uL (ref 0.0–0.7)
Eosinophils Relative: 1 % (ref 0–5)
Monocytes Absolute: 0.4 10*3/uL (ref 0.1–1.0)
Monocytes Relative: 3 % (ref 3–12)
Neutro Abs: 10.5 10*3/uL — ABNORMAL HIGH (ref 1.7–7.7)

## 2011-01-31 LAB — CBC
HCT: 28.4 % — ABNORMAL LOW (ref 36.0–46.0)
Hemoglobin: 8.8 g/dL — ABNORMAL LOW (ref 12.0–15.0)
MCH: 27.7 pg (ref 26.0–34.0)
MCHC: 31 g/dL (ref 30.0–36.0)
MCV: 89.3 fL (ref 78.0–100.0)
Platelets: 237 10*3/uL (ref 150–400)
RBC: 3.18 MIL/uL — ABNORMAL LOW (ref 3.87–5.11)
RDW: 17.5 % — ABNORMAL HIGH (ref 11.5–15.5)
WBC: 11.9 10*3/uL — ABNORMAL HIGH (ref 4.0–10.5)

## 2011-01-31 LAB — COMPREHENSIVE METABOLIC PANEL
Albumin: 3.1 g/dL — ABNORMAL LOW (ref 3.5–5.2)
BUN: 43 mg/dL — ABNORMAL HIGH (ref 6–23)
CO2: 26 mEq/L (ref 19–32)
Calcium: 10.7 mg/dL — ABNORMAL HIGH (ref 8.4–10.5)
Chloride: 97 mEq/L (ref 96–112)
Creatinine, Ser: 9.76 mg/dL — ABNORMAL HIGH (ref 0.50–1.10)
GFR calc non Af Amer: 4 mL/min — ABNORMAL LOW (ref >60)
Total Bilirubin: 0.2 mg/dL — ABNORMAL LOW (ref 0.3–1.2)

## 2011-01-31 LAB — CK TOTAL AND CKMB (NOT AT ARMC): Total CK: 32 U/L (ref 7–177)

## 2011-01-31 LAB — LIPASE, BLOOD: Lipase: 273 U/L — ABNORMAL HIGH (ref 11–59)

## 2011-01-31 LAB — TROPONIN I: Troponin I: 0.32 ng/mL (ref <0.30)

## 2011-01-31 LAB — POCT I-STAT TROPONIN I: Troponin i, poc: 0.03 ng/mL (ref 0.00–0.08)

## 2011-01-31 LAB — AMYLASE: Amylase: 297 U/L — ABNORMAL HIGH (ref 0–105)

## 2011-01-31 NOTE — H&P (Signed)
Hospital Admission Note Date: 01/31/2011  Patient name: [name] Medical record number: [id number] Date of birth: Jul 20, 1950 Age: 60 y.o. Gender: female PCP: No primary provider on file.  Medical Service: Medicine Teaching Service B2  Attending physician:  Dr. Blanch Media    Resident [phone]):  Dr. Bethel Born   Pager:  [phone] Resident (R1):  Dr. Blanca Friend   Pager:  [phone]  Chief Complaint: Progressive SOB  History of Present Illness: [name] is a 60 year old woman with a history of CHF, ischemic cardiomyopathy s/p CABG and PCIs, ESRD on peritoneal dialysis, pancreatitis earlier this month, gallstone pancreatitis in 2003 with pseudocyst that persists according to patient, s/p Lap Chole 2003, and HTN who presents with progressive shortness of breath over the past several days.   She was admitted in May and then again in July for CHF exacerbations.  During the July admission, she was also diagnosed with PNA and treated.  At the end of that admission, she was tranferred to the [facility] because she wanted her inpatient care coordinated with her future outpatient follow-up at the [facility].  She reports that her SOB fully resolved before transfer to the [place].  At the [facility], she was diagnosed with pancreatitis, and did not eat very much by mouth for nine days there.  She reports that the doctors at the [place] drew her peritoneal fluid and it did not show signs of infection.  On discharge from the [place] on August 7th, she felt quite well but had occasional nausea and vomiting.  Over the subsequent two weeks until today, she has been waking up in the morning with progressively worse SOB.    She came to the hospital today because she woke up SOB and never could catch her breath.  Her O2 sat was 88% on arrival to the ED but was 96% once 2L O2 given.  Her SOB has resolved with O2.  She reports that her dry weight is 165 lbs and has not changed appreciably since discharge from  the [place].    She has also vomited three times today.  She has had abdominal pain for the past two days, principally epigastric, and she has also felt diffusely weaker for the last two days.  No fevers or chills.  She had some diarrhea in the [place] hospital earlier this month which she reports was negative for C.Diff.    No chest pain, rashes, dizzyness, subjective edema, numbness, tingling, urinary symptoms, or constipation.     Meds: Current Outpatient Prescriptions  Medication Sig Dispense Refill  . amLODipine (NORVASC) 10 MG tablet Take 10 mg by mouth daily.        [name] aspirin 81 MG tablet Take 81 mg by mouth daily.        . Calcium Acetate, Phos Binder, (CALCIUM ACETATE PO) Take by mouth daily.        . carvedilol (COREG) 25 MG tablet Take 25 mg by mouth 2 (two) times daily with a meal.        . Cholecalciferol (VITAMIN D-3 PO) Take by mouth daily.        [name] doxazosin (CARDURA) 4 MG tablet Take 4 mg by mouth at bedtime.        . hydrALAZINE (APRESOLINE) 25 MG tablet Take 25 mg by mouth daily.        . insulin glargine (LANTUS) 100 UNIT/ML injection Inject into the skin as directed.  10 units daily.      [name]  isosorbide mononitrate (IMDUR) 40 MG 24 hr tablet Take 40 mg by mouth daily.        . Multiple Vitamin (DAILY VITAMIN PO) Take by mouth daily.        . pravastatin (PRAVACHOL) 20 MG tablet Take 20 mg by mouth daily.        . ranolazine (RANEXA) 500 MG 12 hr tablet Take 500 mg by mouth daily.        . sevelamer (RENAGEL) 400 MG tablet Take 400 mg by mouth. 4 tabs po tid        Vicodin 5/500mg  Daily PO PRN    Tylenol PRN  Allergies: Codeine; Lisinopril; and Morphine and related, Claritin  Past Medical History  Diagnosis Date  . Gallstone pancreatitis, s/p Cholecystomy. Pancreatic pseudocyst.   . Coronary artery disease     s/p CABG 2003 with multiple subsequent PCIs ECHO May 2012: EF 20-25%, Diffuse hypokinesis. Dilated left atrium  . Ischemic cardiomyopathy   . End stage renal  disease   . Diabetes mellitus: Pt reports HbA1cs in 4-6% range.   . Hypertension   . History of nonadherence to medical treatment   . LBBB (left bundle branch block)   . Hyperlipidemia    Past Surgical History  Procedure Date  . Coronary artery bypass graft 2008   Family History  Problem Relation Age of Onset  . Coronary artery disease Mother   . Hypertension Mother   . Diabetes Mother   . Diabetes Sister    History   Social History  . Marital Status: Single  Lives by herself. At baseline can walk up one flight of stairs, or walk on flat ground indefinitely.  Social History Main Topics  . Smoking status: Former Games developer.  Smoked <1ppd for 30 yrs.    Quit date: 06/15/2000  . Smokeless tobacco: Not on file  . Alcohol Use: No  . Drug Use: No  . Sexually Active: Not on file   Other Topics Concern  . Not on file   Social History Narrative   The patient lives alone in [place].  She is a retired Engineer, civil (consulting).  She is disabled and receives most of her care from the [place], [place].      Review of Systems: See HPI  Physical Exam: T 97.5, BP 148/93, P 90, R 16, 88% RA -> 96% on 2L O2 General: alert, well-developed, and cooperative to examination.  Head: normocephalic and atraumatic.  Eyes: vision grossly intact, pupils equal, pupils round, pupils reactive to light, no injection and anicteric.  Mouth: pharynx pink and moist, no erythema, and no exudates.  Neck: supple, full ROM, no thyromegaly, no JVD. Lungs: normal respiratory effort, no accessory muscle use.  Crackles at the bases bilaterally. Heart: normal rate, regular rhythm, no murmur, no gallop, and no rub.  Abdomen: soft, normal bowel sounds, no distention, no guarding, no rebound tenderness.  Very tender to palpation in epigastric area.   Msk: no joint swelling, no joint warmth, and no redness over joints.  Pulses: 2+ DP/PT pulses bilaterally Extremities: No cyanosis, clubbing. 1+ pitting edema up to mid calf,  R>L Neurologic: alert & oriented X3, cranial nerves II-XII intact, strength normal in all extremities, sensation intact to light touch. Skin: turgor normal and no rashes.  Psych: Oriented X3, memory intact for recent and remote, normally interactive, good eye contact, not anxious appearing, and not depressed appearing.   Lab results: Basic Metabolic Panel: Recent Labs  [facility] 01/31/11 1557   NA 137  K 4.4   CL 97   CO2 26   GLUCOSE 137*   BUN 43*   CREATININE 9.76*   CALCIUM 10.7*   MG --   PHOS --   Liver Function Tests: Recent Labs  [facility] 01/31/11 1557   AST 17   ALT 11   ALKPHOS 67   BILITOT 0.2*   PROT 7.1   ALBUMIN 3.1*   Recent Labs  [facility] 01/31/11 1557   LIPASE 273*   AMYLASE --   CBC: Recent Labs  [facility] 01/31/11 1557   WBC 11.9*   NEUTROABS 10.5*   HGB 8.8*   HCT 28.4*   MCV 89.3   PLT 237      Imaging results:   Dg Abd Acute W/chest  01/31/2011  *RADIOLOGY REPORT*  Clinical Data: Shortness of breath  ACUTE ABDOMEN SERIES (ABDOMEN 2 VIEW & CHEST 1 VIEW)  Comparison: CT 01/12/2011, chest radiograph 01/13/2011 on  Findings: Improved right upper lobe aeration is noted.  Stable consolidation of the right lower lobe is present.  Evidence of CABG noted.  Minimal patchy left lower lobe airspace opacity is also seen.  No free air beneath the diaphragms.  Epigastric clips are noted.  Trace pleural effusions are noted.  Normal bowel gas pattern. Peritoneal dialysis catheter in place.  No acute osseous finding.  IMPRESSION: Persistent right lower lobe patchy airspace opacities which may represent pneumonia.  Radiographic resolution may take 4-6 weeks. New patchy airspace disease at the left lower lobe could indicate multifocal pneumonia.  Improvement of aeration of the right upper lobe.  Nonobstructive bowel gas pattern.  Original Report Authenticated By: Harrel Lemon, M.D.    Other results: EKG:  No changes from previous.     Assessment & Plan  by Problem:  Acute on Chronic CHF:  Patient's progressive SOB, crackles and lower extremity edema on exam, and history of repeated recent CHF exacerbations make a CHF exacerbation very likely.  CE x 3 Pro BNP Strict I's/O's. Daily weights. Continue home BP meds. Continue ASA 81mg  daily  Possible Pancreatitis: Nausea, vomiting, abdominal pain, Lipase elevated, mild leukocytosis.  This makes pancreatitis likely.  However, she receives peritoneal dialysis which may cause her elevated lipase, and she has a pancreatic pseudocyst which may cause or elevated lipase or focal tenderness.  Only focal tenderness, no fever, and lack of rebound tenderness make peritonitis less likely.     Blood Cx's x2 Amylase Urinalysis with reflex microscopy Zofran IV PRN Tylenol PRN Clear Liquid Diet CBC in am  Possible Pneumonia: Single view chest film shows possible multifocal PNA and mild leukocytosis.  This is unlikely given her history, but we will work it up a bit further.  PA and LA CXRay in the morning Urine legionella antigen Urine pneumococcal antigen Sputum cultures Blood Cx's x2 CBC in am   Ischemic Cardiomyopathy: Continue Isordil Continue home BP meds Continue ASA 81mg  daily Continue Ranolazine  ESRD:  Renal profile in the morning Continue calcitriol Continue Renal Vit Lasix 80mg  TID IV Cont darbopoetin  DM: Sliding scale insulin  HTN: Pressure a bit high in ED.  Continue amlodipine, doxazosin, Coreg  Sternotomy Scar Pain: Continue Vicodin.  Increase frequency to Q12H PRN.  Anemia: Hgb is close to her reported baseline of about 9.  Will continue to monitor.  Continue darbopoetin.            R2/3______________________________      R1________________________________  ATTENDING: I performed and/or observed a history and physical examination  of the patient.  I discussed the case with the residents as noted and reviewed the residents' notes.  I agree with the findings  and plan--please refer to the attending physician note for more details.  Signature________________________________  Printed Name_____________________________

## 2011-02-01 ENCOUNTER — Inpatient Hospital Stay (HOSPITAL_COMMUNITY): Payer: Medicare Other

## 2011-02-01 LAB — CBC
MCH: 28.4 pg (ref 26.0–34.0)
MCHC: 31.7 g/dL (ref 30.0–36.0)
MCV: 89.6 fL (ref 78.0–100.0)
Platelets: 211 10*3/uL (ref 150–400)
RDW: 17.6 % — ABNORMAL HIGH (ref 11.5–15.5)

## 2011-02-01 LAB — RENAL FUNCTION PANEL
BUN: 43 mg/dL — ABNORMAL HIGH (ref 6–23)
CO2: 26 mEq/L (ref 19–32)
Chloride: 99 mEq/L (ref 96–112)
Creatinine, Ser: 9.77 mg/dL — ABNORMAL HIGH (ref 0.50–1.10)
GFR calc non Af Amer: 4 mL/min — ABNORMAL LOW (ref >60)

## 2011-02-01 LAB — URINE MICROSCOPIC-ADD ON

## 2011-02-01 LAB — CARDIAC PANEL(CRET KIN+CKTOT+MB+TROPI)
Relative Index: INVALID (ref 0.0–2.5)
Relative Index: INVALID (ref 0.0–2.5)
Troponin I: 0.3 ng/mL (ref <0.30)

## 2011-02-01 LAB — LIPASE, BLOOD: Lipase: 265 U/L — ABNORMAL HIGH (ref 11–59)

## 2011-02-01 LAB — URINALYSIS, ROUTINE W REFLEX MICROSCOPIC
Glucose, UA: NEGATIVE mg/dL
Hgb urine dipstick: NEGATIVE
Specific Gravity, Urine: 1.016 (ref 1.005–1.030)

## 2011-02-01 LAB — GLUCOSE, CAPILLARY
Glucose-Capillary: 116 mg/dL — ABNORMAL HIGH (ref 70–99)
Glucose-Capillary: 199 mg/dL — ABNORMAL HIGH (ref 70–99)
Glucose-Capillary: 134 mg/dL — ABNORMAL HIGH (ref 70–99)

## 2011-02-02 ENCOUNTER — Inpatient Hospital Stay (HOSPITAL_COMMUNITY): Payer: Medicare Other

## 2011-02-02 ENCOUNTER — Encounter (HOSPITAL_COMMUNITY): Payer: Self-pay | Admitting: Radiology

## 2011-02-02 LAB — CBC
MCHC: 31 g/dL (ref 30.0–36.0)
Platelets: 217 10*3/uL (ref 150–400)
RDW: 17.9 % — ABNORMAL HIGH (ref 11.5–15.5)
WBC: 9.6 10*3/uL (ref 4.0–10.5)

## 2011-02-02 LAB — GLUCOSE, CAPILLARY
Glucose-Capillary: 144 mg/dL — ABNORMAL HIGH (ref 70–99)
Glucose-Capillary: 150 mg/dL — ABNORMAL HIGH (ref 70–99)
Glucose-Capillary: 158 mg/dL — ABNORMAL HIGH (ref 70–99)
Glucose-Capillary: 78 mg/dL (ref 70–99)
Glucose-Capillary: 91 mg/dL (ref 70–99)
Glucose-Capillary: 94 mg/dL (ref 70–99)

## 2011-02-02 LAB — RENAL FUNCTION PANEL
Albumin: 2.9 g/dL — ABNORMAL LOW (ref 3.5–5.2)
Chloride: 96 mEq/L (ref 96–112)
Creatinine, Ser: 9.38 mg/dL — ABNORMAL HIGH (ref 0.50–1.10)
GFR calc non Af Amer: 4 mL/min — ABNORMAL LOW (ref >60)
Phosphorus: 6.7 mg/dL — ABNORMAL HIGH (ref 2.3–4.6)
Potassium: 3.4 mEq/L — ABNORMAL LOW (ref 3.5–5.1)

## 2011-02-03 DIAGNOSIS — J189 Pneumonia, unspecified organism: Secondary | ICD-10-CM

## 2011-02-03 DIAGNOSIS — K859 Acute pancreatitis without necrosis or infection, unspecified: Secondary | ICD-10-CM

## 2011-02-03 LAB — RENAL FUNCTION PANEL
Albumin: 2.6 g/dL — ABNORMAL LOW (ref 3.5–5.2)
CO2: 29 mEq/L (ref 19–32)
Chloride: 96 mEq/L (ref 96–112)
Creatinine, Ser: 9.15 mg/dL — ABNORMAL HIGH (ref 0.50–1.10)
GFR calc Af Amer: 5 mL/min — ABNORMAL LOW (ref >60)
GFR calc non Af Amer: 4 mL/min — ABNORMAL LOW (ref >60)
Potassium: 3.2 mEq/L — ABNORMAL LOW (ref 3.5–5.1)
Sodium: 138 mEq/L (ref 135–145)

## 2011-02-03 LAB — GLUCOSE, CAPILLARY
Glucose-Capillary: 126 mg/dL — ABNORMAL HIGH (ref 70–99)
Glucose-Capillary: 134 mg/dL — ABNORMAL HIGH (ref 70–99)
Glucose-Capillary: 145 mg/dL — ABNORMAL HIGH (ref 70–99)
Glucose-Capillary: 154 mg/dL — ABNORMAL HIGH (ref 70–99)
Glucose-Capillary: 95 mg/dL (ref 70–99)

## 2011-02-03 LAB — CBC
Hemoglobin: 7.7 g/dL — ABNORMAL LOW (ref 12.0–15.0)
MCH: 27.8 pg (ref 26.0–34.0)
Platelets: 206 10*3/uL (ref 150–400)
RBC: 2.77 MIL/uL — ABNORMAL LOW (ref 3.87–5.11)
WBC: 5.7 10*3/uL (ref 4.0–10.5)

## 2011-02-04 LAB — GLUCOSE, CAPILLARY: Glucose-Capillary: 106 mg/dL — ABNORMAL HIGH (ref 70–99)

## 2011-02-04 LAB — RENAL FUNCTION PANEL
BUN: 37 mg/dL — ABNORMAL HIGH (ref 6–23)
CO2: 27 mEq/L (ref 19–32)
Chloride: 97 mEq/L (ref 96–112)
Glucose, Bld: 156 mg/dL — ABNORMAL HIGH (ref 70–99)
Potassium: 3.2 mEq/L — ABNORMAL LOW (ref 3.5–5.1)

## 2011-02-04 LAB — CBC
HCT: 25.9 % — ABNORMAL LOW (ref 36.0–46.0)
Hemoglobin: 8.4 g/dL — ABNORMAL LOW (ref 12.0–15.0)
RBC: 2.97 MIL/uL — ABNORMAL LOW (ref 3.87–5.11)
WBC: 6 10*3/uL (ref 4.0–10.5)

## 2011-02-07 LAB — CULTURE, BLOOD (ROUTINE X 2)
Culture: NO GROWTH
Culture: NO GROWTH

## 2011-02-09 NOTE — Consult Note (Signed)
NAMEBRITTINI, [name]:  [id number]  MEDICAL RECORD NO.:  [id number]  LOCATION:  3310                         FACILITY:  [facility]  PHYSICIAN:  Corky Crafts, MDDATE OF BIRTH:  Jun 20, 1950  DATE OF CONSULTATION:  01/12/2011 DATE OF DISCHARGE:                                CONSULTATION   REQUESTING SERVICE:  [facility].  REASON FOR CONSULTATION:  Shortness of breath, ischemic cardiomyopathy.  HISTORY OF PRESENT ILLNESS:  The patient is a 60 year old with a known ischemic cardiomyopathy.  Her ejection fraction was 25% in the past. She had bypass surgery in 2008.  She has had multiple admissions for heart failure.  She had been feeling well.  She finished her usual course of peritoneal dialysis at about 2 a.m. this morning.  She went back to sleep and woke up at 3 a.m. and was quite short of breath.  She noticed it was worse when she walked.  She came in the emergency room. She now reports abdominal pain and nausea.  She has had issues with labile blood pressure.  She has not tolerated nitroglycerin patch in the past.  She has been okay with IV nitroglycerin.  In the emergency room, she received Bumex, but does not diurese much per Nephrology.  She will continue peritoneal dialysis while in the hospital.  She has not reported pain in her chest, it is more than shortness of breath.  She has not required any oxygen in the emergency room, her oxygen saturations have been in the 90s.  PAST MEDICAL HISTORY: 1. Ischemic cardiomyopathy. 2. Coronary artery disease. 3. Hypertension. 4. End-stage renal disease. 5. Hyperlipidemia. 6. Diabetes.  HOME MEDICATIONS: 1. Cardura 4 mg daily. 2. Lantus 10 units q.a.m. 3. Hydralazine 25 mg daily. 4. Torsemide 40 mg daily. 5. Multivitamin one tablet daily. 6. Renagel four tablets three times per day. 7. Ranexa 500 mg daily. 8. Norvasc 10 mg daily. 9. Isordil 40 mg daily. 10.Vicodin. 11.Coreg 25 mg  b.i.d. 12.Calcitriol. 13.Aspirin 81 mg daily.  ALLERGIES: 1. PENICILLIN. 2. MORPHINE. 3. CODEINE. 4. LISINOPRIL.  SOCIAL HISTORY:  She lives in [place].  She quit smoking several years ago.  She is a retired Engineer, civil (consulting).  FAMILY HISTORY:  Significant for coronary artery disease and hypertension.  REVIEW OF SYSTEMS:  Significant for shortness of breath, which is new onset from this morning.  She now reports abdominal pain and nausea.  No swelling.  She has orthopnea.  All other systems negative.  PHYSICAL EXAMINATION:  VITAL SIGNS:  Blood pressure 164/100, heart rate 90. GENERAL:  She is appears awake, but uncomfortable. HEAD:  Normocephalic, atraumatic. EYES:  Extraocular movements intact. CARDIOVASCULAR:  Regular rate and rhythm. LUNGS:  Bibasilar crackles. ABDOMEN:  Soft.  There is significant left-sided tenderness with palpation. EXTREMITIES:  Showed no edema. NEUROLOGIC:  No focal motor or sensory deficits.  Lab work shows a creatinine 6.6.  BNP 35,000.  Her last BNP in the computer was 38,000.  Chest x-ray shows increased size and pulmonary edema, which is unchanged from her prior x-ray.  Cardiac enzymes are negative.  EKG shows left bundle-branch block, some ST depressions in the lateral leads.  ASSESSMENT AND PLAN:  81. A 60 year old with acute on chronic systolic heart failure.  Chest     x-ray is unchanged.  She has no O2 requirement.  She received     Bumex, does not diurese much.  Continue peritoneal dialysis per     Nephrology. 2. Abdominal pain in a patient on peritoneal dialysis, now also with     nausea which is sudden onset.  She needs a workup to rule out     peritonitis. 3. Hypertension.  Continue home medications.  IV nitroglycerin will be     started, this should help with her blood pressure.  We will follow.     Corky Crafts, MD     JSV/MEDQ  D:  01/12/2011  T:  01/13/2011  Job:  [id number]  cc:   Garnetta Buddy, M.D.  Electronically  Signed by Lance Muss MD on 02/09/2011 02:02:48 PM

## 2011-02-17 NOTE — Discharge Summary (Signed)
[name], [name]:  [id number]  MEDICAL RECORD NO.:  [id number]  LOCATION:  6709                         FACILITY:  [facility]  PHYSICIAN:  Blanch Media, M.D.DATE OF BIRTH:  1950/07/30  DATE OF ADMISSION:  01/31/2011 DATE OF DISCHARGE:  02/04/2011                              DISCHARGE SUMMARY   The patient left AMA (against medical advice) on February 04, 2011.  DISCHARGE DIAGNOSES: 1. Acute-on-chronic congestive heart failure:  Last echocardiogram     performed in May 2012 showed an ejection fraction of 20%-25% with     diffuse hypokinesis. 2. Ischemic cardiomyopathy/coronary artery disease. 3. End-stage renal disease:  On daily peritoneal dialysis. 4. Pancreatitis:  Acute versus chronic. 5. Diabetes mellitus. 6. Hypertension. 7. Hyperlipidemia. 8. History of non-adherence to medical treatment.  DISPOSITION:  The patient reports having a followup with the congestive heart failure clinic at the [facility] on February 05, 2011.  Further disposition planning could not be performed as the patient left against medical advice while the team was working on discharge planning.  DISCHARGE MEDICATIONS:  The patient was not given any list of discharge medications due to the fact that she left against medical advice.  PROCEDURES:  CT abdomen and pelvis without contrast:  Mild peripancreatic stranding compatible with mild pancreatitis.  Two associated peripancreatic fluid collections measuring up to 2 cm as described, grossly unchanged and likely reflecting pseudocyst.  No evidence of bowel obstruction.  Normal appendix.  Scattered foci of free intraperitoneal gas, likely related to indwelling peritoneal dialysis catheter.  Cardiomegaly with suspected interstitial edema.  CT chest two view, impression, diffusely increased interstitial markings since July with vascular congestion, cardiomegaly, and small bilateral pleural effusions likely representing mild  pulmonary edema.  Given bibasilar airspace opacity, underlying pneumonia cannot be excluded.  CONSULTATIONS:  Renal.  ADMITTING HISTORY AND PHYSICAL EXAMINATION:  Ms. 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  She was admitted in May and then again in July with CHF exacerbations. During the July admission, she was also diagnosed with pneumonia and treated.  At the end of that admission, she was transferred to the [facility] because she wanted her inpatient care coordinator with her future outpatient followup at the [facility].  She reports that her shortness of breath fully resolved before transfer to the [place]. At the [facility], she was diagnosed with pancreatitis and did not eat very much by mouth for 9 days there.  She reports that the doctors at the [place] drew her peritoneal fluid and it did not show signs of infection. On discharge in the [place] on August 7, she felt quite well but had occasional nausea and vomiting.  Over the subsequent 2 weeks until the day, she has been waking up in the morning with progressively worse shortness of breath.  She came to the hospital today because she woke  up short of breath and was not able to catch her breath.  Her O2 saturation on arrival to the emergency department was 88%, but this increased to 96% when she was given 2 liters of O2.  Her shortness of breath has resolved by our interview with O2.  She reports that her dry weight is 165 pounds and has not changed appreciably since discharge from the [place].  She also vomited three times today.  She has had abdominal  pain for the past 2 days, especially epigastric, and she is also felt diffusely weak for the last 2 days as well.  She had no fevers or chills.  She had some diarrhea in the very earlier this month, but she reports negative for C. difficile.  No chest pain, rashes, dizziness, subjective edema, numbness, tingling, urinary symptoms, or constipation.  ADMITTING PHYSICAL EXAMINATION:  VITAL SIGNS:  Temperature 97.5, blood pressure 148/93, pulse 90, respirations 16, O2 saturation 96% on 2 liters of oxygen. GENERAL:  Alert, well-developed, and cooperative to examination. HEAD:  Normocephalic and atraumatic. EYES:  Vision grossly intact.  Pupils equally round, reactive to light. No injection and anicteric. MOUTH:  Pharynx pink and moist.  No erythema.  No exudates. NECK:  Supple.  Full range of motion.  No thyromegaly.  No JVD. LUNGS:  Normal respiratory effort.  No accessory muscle use.  Crackles at the bases bilaterally. HEART:  Normal rate and rhythm.  No murmur, rubs, or gallops. ABDOMEN:  Soft.  Normal bowel sounds.  No distention.  No guarding.  No rebound tenderness.  Very tender to palpation in the epigastric area. MUSCULOSKELETAL:  No joint swelling.  No joint warmth and no redness over joints. PULSES:  2+ dorsalis pedis/posterior tibial pulses bilaterally. EXTREMITIES:  No cyanosis or clubbing.  1+ pitting edema up to the mid calf, right greater than left. NEUROLOGIC:  Alert and oriented x3.  Cranial nerves II-XII intact. Strength normal in all extremities.  Sensation intact to light touch. SKIN:  Turgor normal.  No rashes. PSYCH:  Oriented x3.  Memory intact for recent, remote, normally interactive, good eye contact, non-anxious appearing, and not depressed appearing.  ADMISSION LAB RESULTS:  CBC:  White blood cells 11.9, hemoglobin 8.8, hematocrit 28.4, platelets 237, absolute neutrophils 10.5.  CMP:  Sodium 137, potassium 4.4, chloride 97, bicarbonate 26, glucose 137,  BUN 43, creatinine 9.76, total bilirubin 0.2, alkaline phosphatase 67, AST 17, ALT 11, total protein 7.1, albumin 3.1, calcium 10.7, lipase 273, amylase 297, troponin-I 0.32.  CK total 32, CK-MB 2.8.  Urinalysis negative.  HOSPITAL COURSE BY PROBLEM: 1. Acute on chronic congestive heart failure.  On admission, the     patient's shortness of breath and mild bowel lower extremity edema     likely represented an acute exacerbation of her chronic systolic     congestive heart failure.  The patient was treated with peritoneal     dialysis daily to get extra fluid off during this hospitalization.     She is also initiated IV Lasix on admission which was subsequently     switched to p.o. torsemide.  She did not producing any urine during     this hospitalization, however.  Her lower extremity edema resolved     quickly and was completely resolved by the second day of admission.     Her shortness of breath was also completely resolved by this time.     She had no more problems of shortness of breath or lower extremity  given during this admission.  On follow-up with the congestive     heart failure clinic at the [place] in [place], her medication regimen     should be addressed.  The Renal team was concerned about low     pressure setting in dialysis which were often in the 100s/60s     before she started her peritoneal dialysis.  Due to her renal     failure, angiotensin receptor blockers or ACE inhibitors are     contraindicated, so she had been admitted with combination of     Imdur and hydralazine.  Hydralazine had been discontinued due to     the concern of her low pressures and her Coreg dose was decreased     to 12.5 twice a day during this hospitalization as well.  Again     these should be addressed on followup. 2. Pancreatitis.  The patient increasing abdominal pain and vomiting     on admission were likely due to pancreatitis.  Pancreatic enzymes     were elevated on admission and  remained elevated after dialysis on     the first day, also in the 270-290 range for amylase and lipase.     The patient was made n.p.o. with just medicines and ice chips and     was treated with IV Dilaudid for pain.  By the day of discharge,     the patient reported that her pain was nearly completely resolved,     only having a little bit of abdominal pain as she had progressed to     full diet and was trying to eat some meat low.  Otherwise, she     tolerated full diet well.  Imaging showed signs of possible mild     pancreatitis in addition to two likely pseudocysts that were seen     previously. 3. End-stage renal disease:  The patient was continued on peritoneal     dialysis during this hospitalization.  Her dialysis medications     should be reassessed on follow-up.  However, we were unable to make     her scheduled follow-up for this as she left against medical     advice. 4. Hypertension:  The patient's blood pressures during the daytime     were generally well controlled during this hospitalization usually     between the 110-130 systolic over 70s to 90s diastolic.  She had a     couple of higher pressures up to 150 systolic as well as some lower     pressures to 100s/60s at night time before she started dialysis.     As a result of these lower pressure before dialysis, hydralazine was     discontinued.  Amlodipine was discontinued and carvedilol had a dose     decreased to 12.5 mg twice per day during hospitalization.  We were     unable to provide her with the disposition plan of these     medications or any prescriptions for new doses as she left against     medical advice. 5. Hyperlipidemia:  The patient reports that on admission she had not     been taking her statin for several weeks due to running out of this     medication.  We intended to restart this medication on discharge,     but the patient left before we provide her with a prescription for     her pravastatin.   On follow-up, it will be  important to readdress     her hyperlipidemia.  DISCHARGE VITAL SIGNS:  Last set of vital signs before the patient left against medical advice:  Temperature 98.5, pulse 81, respirations 20, blood pressure 135/85, O2 saturation 98% on room air.  DISCHARGE LAB RESULTS:  Last set of labs taken before the patient left against medical advice:  Renal function panel:  Sodium 138, potassium 3.2, chloride 97, bicarbonate 27, glucose 156, BUN 37, creatinine 9.02, calcium 10.4, phosphorus 8.1, albumin 2.8.  It should be noted here that the patient did receive 40 mEq of potassium chloride by mouth after this last final renal function panel was drawn.  This was before she left the hospital against medical advice.  CBC:  White blood cell 6.0, hemoglobin 8.4, hematocrit 25.9, platelets 212.  Blood cultures drawn on admission were negative as of when she left the hospital against medical advice.    ______________________________ Blanca Friend, MD   ______________________________ Blanch Media, M.D.    BW/MEDQ  D:  02/04/2011  T:  02/04/2011  Job:  [id number]  Electronically Signed by Blanca Friend MD on 02/06/2011 11:00:56 PM Electronically Signed by Blanch Media M.D. on 02/17/2011 04:39:59 PM

## 2011-04-01 LAB — BASIC METABOLIC PANEL
BUN: 23
BUN: 25 — ABNORMAL HIGH
CO2: 23
Chloride: 107
Creatinine, Ser: 1.18
Creatinine, Ser: 1.61 — ABNORMAL HIGH
GFR calc non Af Amer: 33 — ABNORMAL LOW
Glucose, Bld: 177 — ABNORMAL HIGH
Glucose, Bld: 253 — ABNORMAL HIGH
Potassium: 3.8
Potassium: 4.5

## 2011-04-01 LAB — CBC
HCT: 31.6 — ABNORMAL LOW
MCHC: 32.7
MCHC: 32.7
MCV: 78.3
Platelets: 255
Platelets: 256
Platelets: 273
RBC: 3.87
RBC: 4.22
WBC: 7

## 2011-04-01 LAB — I-STAT 8, (EC8 V) (CONVERTED LAB)
Acid-base deficit: 1
Chloride: 107
HCT: 36
Operator id: 277751
Potassium: 4.3

## 2011-04-01 LAB — COMPREHENSIVE METABOLIC PANEL
AST: 14
Albumin: 3 — ABNORMAL LOW
Calcium: 9.3
Chloride: 106
Creatinine, Ser: 1.51 — ABNORMAL HIGH
GFR calc Af Amer: 43 — ABNORMAL LOW
Total Bilirubin: 0.2 — ABNORMAL LOW
Total Protein: 6.4

## 2011-04-01 LAB — CARDIAC PANEL(CRET KIN+CKTOT+MB+TROPI)
Relative Index: INVALID
Relative Index: INVALID
Total CK: 46
Troponin I: 0.03
Troponin I: 0.03

## 2011-04-01 LAB — DIGOXIN LEVEL
Digoxin Level: 0.3 — ABNORMAL LOW
Digoxin Level: 0.6 — ABNORMAL LOW

## 2011-04-01 LAB — HEMOGLOBIN A1C: Hgb A1c MFr Bld: 9.2 — ABNORMAL HIGH

## 2011-04-01 LAB — PROTIME-INR
INR: 1.2
Prothrombin Time: 15.4 — ABNORMAL HIGH

## 2011-04-01 LAB — POCT CARDIAC MARKERS: CKMB, poc: 1.1

## 2011-04-01 LAB — APTT: aPTT: 45 — ABNORMAL HIGH

## 2011-04-01 LAB — POCT I-STAT CREATININE: Operator id: 277751

## 2011-04-01 LAB — TSH: TSH: 2.125

## 2011-04-08 ENCOUNTER — Other Ambulatory Visit: Payer: Self-pay | Admitting: Oncology

## 2011-04-08 ENCOUNTER — Encounter: Payer: Self-pay | Admitting: *Deleted

## 2011-04-08 ENCOUNTER — Encounter (HOSPITAL_BASED_OUTPATIENT_CLINIC_OR_DEPARTMENT_OTHER): Payer: Medicare Other | Admitting: Oncology

## 2011-04-08 DIAGNOSIS — D539 Nutritional anemia, unspecified: Secondary | ICD-10-CM

## 2011-04-08 DIAGNOSIS — N189 Chronic kidney disease, unspecified: Secondary | ICD-10-CM

## 2011-04-08 LAB — IRON AND TIBC
%SAT: 14 % — ABNORMAL LOW (ref 20–55)
Iron: 37 ug/dL — ABNORMAL LOW (ref 42–145)
TIBC: 274 ug/dL (ref 250–470)

## 2011-04-08 LAB — CBC WITH DIFFERENTIAL/PLATELET
BASO%: 0.2 % (ref 0.0–2.0)
Eosinophils Absolute: 0.2 10*3/uL (ref 0.0–0.5)
HCT: 30.1 % — ABNORMAL LOW (ref 34.8–46.6)
LYMPH%: 20.6 % (ref 14.0–49.7)
MCHC: 33.5 g/dL (ref 31.5–36.0)
MONO#: 0.4 10*3/uL (ref 0.1–0.9)
NEUT#: 4.7 10*3/uL (ref 1.5–6.5)
NEUT%: 69.3 % (ref 38.4–76.8)
Platelets: 232 10*3/uL (ref 145–400)
WBC: 6.7 10*3/uL (ref 3.9–10.3)
lymph#: 1.4 10*3/uL (ref 0.9–3.3)

## 2011-04-08 LAB — FERRITIN: Ferritin: 665 ng/mL — ABNORMAL HIGH (ref 10–291)

## 2011-06-02 ENCOUNTER — Inpatient Hospital Stay (HOSPITAL_COMMUNITY)
Admission: EM | Admit: 2011-06-02 | Discharge: 2011-06-05 | DRG: 640 | Disposition: A | Discharge status: Home / Self Care | Payer: Non-veteran care | Attending: Internal Medicine | Admitting: Internal Medicine

## 2011-06-02 ENCOUNTER — Encounter (HOSPITAL_COMMUNITY): Payer: Self-pay | Admitting: Internal Medicine

## 2011-06-02 ENCOUNTER — Emergency Department (HOSPITAL_COMMUNITY): Payer: Non-veteran care

## 2011-06-02 ENCOUNTER — Other Ambulatory Visit: Payer: Self-pay

## 2011-06-02 DIAGNOSIS — N185 Chronic kidney disease, stage 5: Secondary | ICD-10-CM | POA: Diagnosis present

## 2011-06-02 DIAGNOSIS — Z951 Presence of aortocoronary bypass graft: Secondary | ICD-10-CM

## 2011-06-02 DIAGNOSIS — I255 Ischemic cardiomyopathy: Secondary | ICD-10-CM

## 2011-06-02 DIAGNOSIS — E876 Hypokalemia: Secondary | ICD-10-CM | POA: Diagnosis not present

## 2011-06-02 DIAGNOSIS — E785 Hyperlipidemia, unspecified: Secondary | ICD-10-CM | POA: Diagnosis present

## 2011-06-02 DIAGNOSIS — Z794 Long term (current) use of insulin: Secondary | ICD-10-CM

## 2011-06-02 DIAGNOSIS — I5023 Acute on chronic systolic (congestive) heart failure: Secondary | ICD-10-CM | POA: Diagnosis present

## 2011-06-02 DIAGNOSIS — I2589 Other forms of chronic ischemic heart disease: Secondary | ICD-10-CM | POA: Diagnosis present

## 2011-06-02 DIAGNOSIS — I447 Left bundle-branch block, unspecified: Secondary | ICD-10-CM

## 2011-06-02 DIAGNOSIS — I509 Heart failure, unspecified: Secondary | ICD-10-CM

## 2011-06-02 DIAGNOSIS — R0602 Shortness of breath: Secondary | ICD-10-CM

## 2011-06-02 DIAGNOSIS — R0600 Dyspnea, unspecified: Secondary | ICD-10-CM

## 2011-06-02 DIAGNOSIS — I2510 Atherosclerotic heart disease of native coronary artery without angina pectoris: Secondary | ICD-10-CM | POA: Diagnosis present

## 2011-06-02 DIAGNOSIS — N189 Chronic kidney disease, unspecified: Secondary | ICD-10-CM

## 2011-06-02 DIAGNOSIS — Z992 Dependence on renal dialysis: Secondary | ICD-10-CM

## 2011-06-02 DIAGNOSIS — Z7982 Long term (current) use of aspirin: Secondary | ICD-10-CM

## 2011-06-02 DIAGNOSIS — E119 Type 2 diabetes mellitus without complications: Secondary | ICD-10-CM | POA: Diagnosis present

## 2011-06-02 DIAGNOSIS — E8771 Transfusion associated circulatory overload: Principal | ICD-10-CM | POA: Diagnosis present

## 2011-06-02 DIAGNOSIS — N186 End stage renal disease: Secondary | ICD-10-CM | POA: Diagnosis present

## 2011-06-02 DIAGNOSIS — I5022 Chronic systolic (congestive) heart failure: Secondary | ICD-10-CM | POA: Diagnosis present

## 2011-06-02 DIAGNOSIS — I120 Hypertensive chronic kidney disease with stage 5 chronic kidney disease or end stage renal disease: Secondary | ICD-10-CM | POA: Diagnosis present

## 2011-06-02 DIAGNOSIS — I10 Essential (primary) hypertension: Secondary | ICD-10-CM | POA: Diagnosis present

## 2011-06-02 HISTORY — DX: Heart failure, unspecified: I50.9

## 2011-06-02 LAB — BASIC METABOLIC PANEL
Calcium: 10.9 mg/dL — ABNORMAL HIGH (ref 8.4–10.5)
GFR calc non Af Amer: 5 mL/min — ABNORMAL LOW (ref >90)
Potassium: 2.9 mEq/L — ABNORMAL LOW (ref 3.5–5.1)
Sodium: 137 mEq/L (ref 135–145)

## 2011-06-02 LAB — POCT I-STAT TROPONIN I: Troponin i, poc: 0.05 ng/mL (ref 0.00–0.08)

## 2011-06-02 LAB — CBC
Hemoglobin: 9.1 g/dL — ABNORMAL LOW (ref 12.0–15.0)
MCH: 27.9 pg (ref 26.0–34.0)
MCHC: 33 g/dL (ref 30.0–36.0)
Platelets: 205 10*3/uL (ref 150–400)
RBC: 3.26 MIL/uL — ABNORMAL LOW (ref 3.87–5.11)

## 2011-06-02 LAB — PRO B NATRIURETIC PEPTIDE: Pro B Natriuretic peptide (BNP): 31365 pg/mL — ABNORMAL HIGH (ref 0–125)

## 2011-06-02 MED ORDER — ONDANSETRON HCL 4 MG PO TABS: 4.0000 mg | ORAL_TABLET | Freq: Four times a day (QID) | ORAL | Status: DC | PRN

## 2011-06-02 MED ORDER — HYDRALAZINE HCL 25 MG PO TABS
25.0000 mg | ORAL_TABLET | Freq: Every day | ORAL | Status: DC
  Administered 2011-06-03 – 2011-06-04 (×2): 25 mg via ORAL
  Filled 2011-06-02 (×2): qty 1

## 2011-06-02 MED ORDER — SEVELAMER HCL 800 MG PO TABS
3200.0000 mg | ORAL_TABLET | Freq: Three times a day (TID) | ORAL | Status: DC
  Filled 2011-06-02 (×10): qty 4

## 2011-06-02 MED ORDER — SODIUM CHLORIDE 0.9 % IJ SOLN
3.0000 mL | Freq: Two times a day (BID) | INTRAMUSCULAR | Status: DC
  Administered 2011-06-03 – 2011-06-05 (×6): 3 mL via INTRAVENOUS

## 2011-06-02 MED ORDER — INSULIN ASPART 100 UNIT/ML ~~LOC~~ SOLN
0.0000 [IU] | Freq: Three times a day (TID) | SUBCUTANEOUS | Status: DC
  Administered 2011-06-05: 2 [IU] via SUBCUTANEOUS
  Filled 2011-06-02: qty 3

## 2011-06-02 MED ORDER — ACETAMINOPHEN 650 MG RE SUPP: 650.0000 mg | Freq: Four times a day (QID) | RECTAL | Status: DC | PRN

## 2011-06-02 MED ORDER — SIMVASTATIN 10 MG PO TABS
10.0000 mg | ORAL_TABLET | Freq: Every day | ORAL | Status: DC
  Administered 2011-06-03 – 2011-06-04 (×2): 10 mg via ORAL
  Filled 2011-06-02 (×3): qty 1

## 2011-06-02 MED ORDER — CARVEDILOL 25 MG PO TABS
25.0000 mg | ORAL_TABLET | Freq: Two times a day (BID) | ORAL | Status: DC
  Administered 2011-06-03 – 2011-06-05 (×5): 25 mg via ORAL
  Filled 2011-06-02 (×7): qty 1

## 2011-06-02 MED ORDER — ACETAMINOPHEN 325 MG PO TABS: 650.0000 mg | ORAL_TABLET | Freq: Four times a day (QID) | ORAL | Status: DC | PRN

## 2011-06-02 MED ORDER — AMLODIPINE BESYLATE 10 MG PO TABS
10.0000 mg | ORAL_TABLET | Freq: Every day | ORAL | Status: DC
  Administered 2011-06-03 – 2011-06-04 (×2): 10 mg via ORAL
  Filled 2011-06-02 (×2): qty 1

## 2011-06-02 MED ORDER — ASPIRIN EC 81 MG PO TBEC
81.0000 mg | DELAYED_RELEASE_TABLET | Freq: Every day | ORAL | Status: DC
  Administered 2011-06-03 – 2011-06-05 (×3): 81 mg via ORAL
  Filled 2011-06-02 (×3): qty 1

## 2011-06-02 MED ORDER — FUROSEMIDE 10 MG/ML IJ SOLN
INTRAMUSCULAR | Status: AC
  Administered 2011-06-02: 40 mg via INTRAVENOUS
  Filled 2011-06-02: qty 4

## 2011-06-02 MED ORDER — FUROSEMIDE 10 MG/ML IJ SOLN
40.0000 mg | Freq: Once | INTRAMUSCULAR | Status: AC
  Administered 2011-06-02: 40 mg via INTRAVENOUS

## 2011-06-02 MED ORDER — RANOLAZINE ER 500 MG PO TB12
500.0000 mg | ORAL_TABLET | Freq: Every day | ORAL | Status: DC
  Administered 2011-06-03 – 2011-06-05 (×3): 500 mg via ORAL
  Filled 2011-06-02 (×3): qty 1

## 2011-06-02 MED ORDER — TORSEMIDE 20 MG PO TABS
40.0000 mg | ORAL_TABLET | Freq: Every day | ORAL | Status: DC
  Administered 2011-06-03: 40 mg via ORAL
  Filled 2011-06-02: qty 2

## 2011-06-02 MED ORDER — ONDANSETRON HCL 4 MG/2ML IJ SOLN
4.0000 mg | Freq: Four times a day (QID) | INTRAMUSCULAR | Status: DC | PRN
  Administered 2011-06-05 (×2): 4 mg via INTRAVENOUS
  Filled 2011-06-02 (×2): qty 2

## 2011-06-02 MED ORDER — ISOSORBIDE MONONITRATE ER 30 MG PO TB24
30.0000 mg | ORAL_TABLET | Freq: Every day | ORAL | Status: DC
  Administered 2011-06-03 – 2011-06-05 (×3): 30 mg via ORAL
  Filled 2011-06-02 (×3): qty 1

## 2011-06-02 MED ORDER — INSULIN GLARGINE 100 UNIT/ML ~~LOC~~ SOLN
10.0000 [IU] | SUBCUTANEOUS | Status: DC
  Administered 2011-06-03 – 2011-06-05 (×3): 10 [IU] via SUBCUTANEOUS
  Filled 2011-06-02 (×2): qty 3

## 2011-06-02 MED ORDER — DOXAZOSIN MESYLATE 4 MG PO TABS
4.0000 mg | ORAL_TABLET | Freq: Every day | ORAL | Status: DC
  Administered 2011-06-03: 4 mg via ORAL
  Filled 2011-06-02 (×3): qty 1

## 2011-06-02 NOTE — ED Notes (Signed)
Called [phone] to give report and they stated that RN was in pts room and was unable to give report. Gave them a number to call back and will follow up

## 2011-06-02 NOTE — ED Provider Notes (Signed)
History     CSN: [id number] Arrival date & time: 06/02/2011  6:10 PM   First MD Initiated Contact with Patient 06/02/11 1844      Chief Complaint  Patient presents with  . Congestive Heart Failure    (Consider location/radiation/quality/duration/timing/severity/associated sxs/prior treatment) Patient is a 60 y.o. female presenting with CHF. The history is provided by the patient.  Congestive Heart Failure Associated symptoms include shortness of breath. Pertinent negatives include no chest pain, no abdominal pain and no headaches.  pt w hx ischemic cm/cad, chf, esrd on peritoneal dialyses c/o sob after getting 2 units prbc transfusion at outside facility today. States long hx anemia and prior transfusions. No recent blood loss, rectal bleeding or melena. No sob during transfusion. States had ivf running for several hours in and around time of transfusion. Subsequently noted sob, and states was given her normal dose of daily lasix iv, 40 mg. Did urinate since lasix. States at baseline, urinates not at all during day, but 1-2 x at night. Is on peritoneal dialyses - states compliant, no recent change in routine. Notes no other recent change in meds. No cough or uri c/o. No fever or chills. No current or recent chest discomfort. No orthopnea or pnd. No leg swelling or pain. Non smoker. No hx asthma or copd.   Past Medical History  Diagnosis Date  . Gallstone pancreatitis   . Coronary artery disease     s/p CABG 2008 with multiple PCIs  . Ischemic cardiomyopathy   . End stage renal disease   . Diabetes mellitus   . Hypertension   . History of nonadherence to medical treatment   . LBBB (left bundle branch block)   . Hyperlipidemia   . Renal insufficiency     Past Surgical History  Procedure Date  . Coronary artery bypass graft 2008    Family History  Problem Relation Age of Onset  . Coronary artery disease Mother   . Hypertension Mother   . Diabetes Mother   . Diabetes Sister       History  Substance Use Topics  . Smoking status: Former Smoker    Quit date: 06/15/2000  . Smokeless tobacco: Not on file  . Alcohol Use: No    OB History    Grav Para Term Preterm Abortions TAB SAB Ect Mult Living                  Review of Systems  Constitutional: Negative for fever and chills.  HENT: Negative for neck pain.   Eyes: Negative for redness.  Respiratory: Positive for shortness of breath. Negative for cough.   Cardiovascular: Negative for chest pain, palpitations and leg swelling.  Gastrointestinal: Negative for abdominal pain.  Genitourinary: Negative for flank pain.  Musculoskeletal: Negative for back pain.  Skin: Negative for rash.  Neurological: Negative for headaches.  Hematological: Does not bruise/bleed easily.  Psychiatric/Behavioral: Negative for confusion.    Allergies  Morphine and related; Codeine; and Lisinopril  Home Medications   Current Outpatient Rx  Name Route Sig Dispense Refill  . AMLODIPINE BESYLATE 10 MG PO TABS Oral Take 10 mg by mouth daily.      . ASPIRIN EC 81 MG PO TBEC Oral Take 81 mg by mouth daily.      [name] CARVEDILOL 25 MG PO TABS Oral Take 25 mg by mouth 2 (two) times daily with a meal.      . DOXAZOSIN MESYLATE 4 MG PO TABS Oral Take 4 mg by  mouth at bedtime.      [name] HYDRALAZINE HCL 25 MG PO TABS Oral Take 25 mg by mouth daily.      . INSULIN GLARGINE 100 UNIT/ML [place] SOLN Subcutaneous Inject 10 Units into the skin every morning.     . ISOSORBIDE MONONITRATE ER 30 MG PO TB24 Oral Take 30 mg by mouth daily.      [name] DAILY VITAMIN PO Oral Take by mouth daily.      [name] PRAVASTATIN SODIUM 20 MG PO TABS Oral Take 20 mg by mouth daily.      [name] RANOLAZINE 500 MG PO TB12 Oral Take 500 mg by mouth daily.      [name] SEVELAMER HCL 800 MG PO TABS Oral Take 3,200 mg by mouth 3 (three) times daily with meals.     . TORSEMIDE 20 MG PO TABS Oral Take 40 mg by mouth daily.       BP 125/73  Pulse 90  Temp(Src) 98.5 F (36.9 C) (Oral)  Resp  21  SpO2 91%  Physical Exam  Nursing note and vitals reviewed. Constitutional: She is oriented to person, place, and time. She appears well-developed and well-nourished. No distress.  HENT:  Mouth/Throat: Oropharynx is clear and moist.  Eyes: Conjunctivae are normal. No scleral icterus.  Neck: Neck supple. No JVD present. No tracheal deviation present.  Cardiovascular: Normal rate, regular rhythm, normal heart sounds and intact distal pulses.  Exam reveals no gallop and no friction rub.   No murmur heard. Pulmonary/Chest: Effort normal and breath sounds normal. No respiratory distress.  Abdominal: Soft. Normal appearance and bowel sounds are normal. She exhibits no distension. There is no tenderness.  Musculoskeletal: She exhibits no edema and no tenderness.  Neurological: She is alert and oriented to person, place, and time.  Skin: Skin is warm and dry. No rash noted.  Psychiatric: She has a normal mood and affect.    ED Course  Procedures (including critical care time)   Labs Reviewed  I-STAT TROPONIN I  CBC  BASIC METABOLIC PANEL      MDM  Cxr. Lab.    Date: 06/02/2011  Rate: 90  Rhythm: normal sinus rhythm  QRS Axis: left  Intervals: normal  ST/T Wave abnormalities: nonspecific ST/T changes  Conduction Disutrbances:left bundle branch block  Narrative Interpretation:   Old EKG Reviewed: unchanged  Pt states makes urine at baseline, is on lasix at home. Pt recd ivf and blood products today, vasc cong/edema on cxr. Lasix iv.   Recheck no cp. resp status stable.   Discussed w triad, indicates admit to team 7, tele.      Suzi Roots, MD 06/02/11 [phone]

## 2011-06-02 NOTE — ED Notes (Signed)
Pt to ED for eval of SOB/CHF; EMS reports that pt went to [facility] hospital to receive 2 units of blood; pt reports that they also gave her 20mg  lasix after transfusion; pt got SOB, but was still discharged home, EMS reports crackles in lung bases

## 2011-06-02 NOTE — H&P (Signed)
[name] is an 60 y.o. female.   PCP - At [facility] in [facility]. Chief Complaint: Shortness of breath. HPI: 60 year-old female history of ESRD on peritoneal dialysis since February of this year had gone to [place] at [facility] for blood transfusion as her hemoglobin was found beyond 7. She usually does peritoneal dialysis in the morning. Today after her dialysis she went to the [facility] around 12:00 she had the first unit of packed red blood cells. And after the second unit was started she felt short of breath and the transfusion was stopped and she was given some Lasix she felt better she went home. She lives in [place]. After reaching home she became more short of breath so she called the EMS and she was brought to the ER at [facility] Chest x-ray shows pulmonary edema pattern. Though she is a dialysis patient she still makes urine. She got an additional dose of Lasix 40 IV and after which he made more urine and she is more comfortable at this time. Cardiology was consulted by the ER physician. They requested medical admission and to diurese. Patient at this time is not in acute distress. Denies any chest pain, fever or chills, nausea vomiting, abdominal pain or diarrhea.    Past Medical History  Diagnosis Date  . Gallstone pancreatitis   . Coronary artery disease     s/p CABG 2008 with multiple PCIs  . Ischemic cardiomyopathy   . End stage renal disease   . Diabetes mellitus   . Hypertension   . History of nonadherence to medical treatment   . LBBB (left bundle branch block)   . Hyperlipidemia   . Renal insufficiency     Past Surgical History  Procedure Date  . Coronary artery bypass graft 2008  . Cholecystectomy     Family History  Problem Relation Age of Onset  . Coronary artery disease Mother   . Hypertension Mother   . Diabetes Mother   . Diabetes Sister    Social History:  reports that she quit smoking about 10 years ago. She does not have any smokeless tobacco history  on file. She reports that she does not drink alcohol or use illicit drugs.  Allergies:  Allergies  Allergen Reactions  . Morphine And Related Anaphylaxis  . Codeine Itching  . Lisinopril     Language changed    Medications Prior to Admission  Medication Dose Route Frequency Provider Last Rate Last Dose  . furosemide (LASIX) injection 40 mg  40 mg Intravenous Once Suzi Roots, MD   40 mg at 06/02/11 2145   Medications Prior to Admission  Medication Sig Dispense Refill  . amLODipine (NORVASC) 10 MG tablet Take 10 mg by mouth daily.        . carvedilol (COREG) 25 MG tablet Take 25 mg by mouth 2 (two) times daily with a meal.        . doxazosin (CARDURA) 4 MG tablet Take 4 mg by mouth at bedtime.        . hydrALAZINE (APRESOLINE) 25 MG tablet Take 25 mg by mouth daily.        . insulin glargine (LANTUS) 100 UNIT/ML injection Inject 10 Units into the skin every morning.       . isosorbide mononitrate (IMDUR) 30 MG 24 hr tablet Take 30 mg by mouth daily.        . Multiple Vitamin (DAILY VITAMIN PO) Take by mouth daily.        [name]  pravastatin (PRAVACHOL) 20 MG tablet Take 20 mg by mouth daily.        . ranolazine (RANEXA) 500 MG 12 hr tablet Take 500 mg by mouth daily.        . sevelamer (RENAGEL) 800 MG tablet Take 3,200 mg by mouth 3 (three) times daily with meals.       . torsemide (DEMADEX) 20 MG tablet Take 40 mg by mouth daily.         Results for orders placed during the hospital encounter of 06/02/11 (from the past 48 hour(s))  CBC     Status: Abnormal   Collection Time   06/02/11  7:10 PM      Component Value Range Comment   WBC 14.8 (*) 4.0 - 10.5 (K/uL)    RBC 3.26 (*) 3.87 - 5.11 (MIL/uL)    Hemoglobin 9.1 (*) 12.0 - 15.0 (g/dL)    HCT 09.8 (*) 11.9 - 46.0 (%)    MCV 84.7  78.0 - 100.0 (fL)    MCH 27.9  26.0 - 34.0 (pg)    MCHC 33.0  30.0 - 36.0 (g/dL)    RDW 14.7 (*) 82.9 - 15.5 (%)    Platelets 205  150 - 400 (K/uL)   BASIC METABOLIC PANEL     Status: Abnormal    Collection Time   06/02/11  7:10 PM      Component Value Range Comment   Sodium 137  135 - 145 (mEq/L)    Potassium 2.9 (*) 3.5 - 5.1 (mEq/L)    Chloride 98  96 - 112 (mEq/L)    CO2 25  19 - 32 (mEq/L)    Glucose, Bld 113 (*) 70 - 99 (mg/dL)    BUN 39 (*) 6 - 23 (mg/dL)    Creatinine, Ser 5.62 (*) 0.50 - 1.10 (mg/dL)    Calcium 13.0 (*) 8.4 - 10.5 (mg/dL)    GFR calc non Af Amer 5 (*) >90 (mL/min)    GFR calc Af Amer 5 (*) >90 (mL/min)   POCT I-STAT TROPONIN I     Status: Normal   Collection Time   06/02/11  7:32 PM      Component Value Range Comment   Troponin i, poc 0.05  0.00 - 0.08 (ng/mL)    Comment 3            PRO B NATRIURETIC PEPTIDE     Status: Abnormal   Collection Time   06/02/11  8:34 PM      Component Value Range Comment   Pro B Natriuretic peptide (BNP) 31365.0 (*) 0 - 125 (pg/mL)    Dg Chest 2 View  06/02/2011  *RADIOLOGY REPORT*  Clinical Data: Severe shortness of breath which occurred during a blood transfusion earlier today.  CHEST - 2 VIEW 06/02/2011:  Comparison: Two-view chest x-ray 02/01/2011, 01/13/2011, and 07/17/2009 [facility].  Findings: Prior sternotomy for CABG.  Cardiac silhouette markedly enlarged but stable.  Diffuse airspace opacity throughout both lungs, right greater than left, most confluent in the inferior right upper lobe.  No visible pleural effusions.  Thoracic aorta atherosclerotic and tortuous, unchanged.  Hilar and mediastinal contours otherwise unremarkable.  Visualized bony thorax intact.  IMPRESSION: Asymmetric airspace pulmonary edema, right greater than left, is favored over pneumonia.  Stable marked cardiomegaly.  Original Report Authenticated By: Arnell Sieving, M.D.    Review of Systems  Constitutional: Negative.   HENT: Negative.   Eyes: Negative.   Respiratory: Positive for shortness of  breath and wheezing.   Cardiovascular: Negative.   Gastrointestinal: Negative.   Genitourinary: Negative.   Musculoskeletal:  Negative.   Skin: Negative.   Neurological: Negative.   Endo/Heme/Allergies: Negative.   Psychiatric/Behavioral: Negative.     Blood pressure 129/79, pulse 90, temperature 98.5 F (36.9 C), temperature source Oral, resp. rate 18, SpO2 99.00%. Physical Exam  Constitutional: She is oriented to person, place, and time. She appears well-developed and well-nourished. No distress.  HENT:  Head: Normocephalic and atraumatic.  Right Ear: External ear normal.  Left Ear: External ear normal.  Nose: Nose normal.  Mouth/Throat: Oropharynx is clear and moist. No oropharyngeal exudate.  Eyes: Conjunctivae and EOM are normal. Pupils are equal, round, and reactive to light. Right eye exhibits no discharge. Left eye exhibits no discharge. No scleral icterus.  Neck: Normal range of motion. Neck supple.  Cardiovascular: Normal rate, regular rhythm and normal heart sounds.   Respiratory: Effort normal and breath sounds normal. No respiratory distress. She has no wheezes. She has no rales.  GI: Soft. Bowel sounds are normal. She exhibits no distension. There is no tenderness. There is no rebound.       Dialysis catheter in the left lower quadrant.  Musculoskeletal: Normal range of motion. She exhibits no edema and no tenderness.  Neurological: She is alert and oriented to person, place, and time. She has normal reflexes.  Skin: Skin is warm and dry. She is not diaphoretic.  Psychiatric: Her behavior is normal.     Assessment/Plan #1. Shortness of breath in a dialysis patient after receiving blood transfusion - patient's shortness of breath is most likely from fluid overload. Her shortness of breath has improved after Lasix. It is less likely that the patient has acute lung injury from transfusion. For now we'll admit and observe in telemetry continue home medication including Demadex. Get peritoneal dialysis in the morning and if stable probably discharge home. #2. History of CAD with chronic LBBB and  ischemic cardiomyopathy - denies any chest pain at this time we'll cycle cardiac markers and her shortness of breath is mostly likely from fluid overload from the transfusion. We'll closely follow. #3. History of diabetes mellitus 2 -  continue her present medications. #4. Anemia - presently her hemoglobin is around 9. She received one unit of packed red blood cells earlier today. She denies any black stools or nausea vomiting. We will recheck hemoglobin in a.m. Her anemia is probably from chronic kidney disease.  CODE STATUS - full code.  Eduard Clos 06/02/2011, 10:31 PM

## 2011-06-02 NOTE — ED Notes (Signed)
Gave Report to [facility] on 5500. She did not have any questions and/or conerrsns.

## 2011-06-02 NOTE — ED Notes (Signed)
Pt is concerned about taking another dose of lasix after taking her 40mg  PO dose at home and 20mg  IV at [facility]; pt wants to speak with EDP prior to receiving or getting IV; notified EDP

## 2011-06-02 NOTE — ED Notes (Signed)
[id number] ready

## 2011-06-03 ENCOUNTER — Encounter (HOSPITAL_COMMUNITY): Payer: Self-pay | Admitting: *Deleted

## 2011-06-03 DIAGNOSIS — I059 Rheumatic mitral valve disease, unspecified: Secondary | ICD-10-CM

## 2011-06-03 DIAGNOSIS — N185 Chronic kidney disease, stage 5: Secondary | ICD-10-CM | POA: Diagnosis present

## 2011-06-03 LAB — COMPREHENSIVE METABOLIC PANEL
Albumin: 3.5 g/dL (ref 3.5–5.2)
Alkaline Phosphatase: 60 U/L (ref 39–117)
BUN: 42 mg/dL — ABNORMAL HIGH (ref 6–23)
CO2: 24 mEq/L (ref 19–32)
Chloride: 100 mEq/L (ref 96–112)
GFR calc non Af Amer: 5 mL/min — ABNORMAL LOW (ref >90)
Glucose, Bld: 121 mg/dL — ABNORMAL HIGH (ref 70–99)
Potassium: 2.7 mEq/L — CL (ref 3.5–5.1)
Total Bilirubin: 0.4 mg/dL (ref 0.3–1.2)

## 2011-06-03 LAB — HEMOGLOBIN A1C
Hgb A1c MFr Bld: 5.2 % (ref <5.7)
Mean Plasma Glucose: 103 mg/dL (ref <117)

## 2011-06-03 LAB — CBC
MCV: 84.7 fL (ref 78.0–100.0)
Platelets: 229 10*3/uL (ref 150–400)
RDW: 19 % — ABNORMAL HIGH (ref 11.5–15.5)
WBC: 13.9 10*3/uL — ABNORMAL HIGH (ref 4.0–10.5)

## 2011-06-03 LAB — GLUCOSE, CAPILLARY
Glucose-Capillary: 118 mg/dL — ABNORMAL HIGH (ref 70–99)
Glucose-Capillary: 125 mg/dL — ABNORMAL HIGH (ref 70–99)
Glucose-Capillary: 117 mg/dL — ABNORMAL HIGH (ref 70–99)
Glucose-Capillary: 79 mg/dL (ref 70–99)

## 2011-06-03 LAB — PHOSPHORUS: Phosphorus: 4.2 mg/dL (ref 2.3–4.6)

## 2011-06-03 LAB — CARDIAC PANEL(CRET KIN+CKTOT+MB+TROPI)
CK, MB: 2 ng/mL (ref 0.3–4.0)
CK, MB: 2.1 ng/mL (ref 0.3–4.0)
Relative Index: INVALID (ref 0.0–2.5)
Total CK: 39 U/L (ref 7–177)
Troponin I: <0.3 ng/mL (ref <0.30)
Troponin I: <0.3 ng/mL (ref <0.30)

## 2011-06-03 LAB — IRON AND TIBC: TIBC: 317 ug/dL (ref 250–470)

## 2011-06-03 MED ORDER — FUROSEMIDE 10 MG/ML IJ SOLN
20.0000 mg | Freq: Once | INTRAMUSCULAR | Status: AC
  Administered 2011-06-03: 20 mg via INTRAVENOUS
  Filled 2011-06-03: qty 2

## 2011-06-03 MED ORDER — RENA-VITE PO TABS
1.0000 | ORAL_TABLET | Freq: Every day | ORAL | Status: DC
  Administered 2011-06-04 – 2011-06-05 (×2): 1 via ORAL
  Filled 2011-06-03 (×4): qty 1

## 2011-06-03 MED ORDER — DARBEPOETIN ALFA-POLYSORBATE 200 MCG/0.4ML IJ SOLN
200.0000 ug | Freq: Once | INTRAMUSCULAR | Status: DC
  Filled 2011-06-03: qty 0.4

## 2011-06-03 MED ORDER — DELFLEX-LM/4.25% DEXTROSE 485 MOSM/L IP SOLN: INTRAPERITONEAL | Status: DC

## 2011-06-03 MED ORDER — POTASSIUM CHLORIDE CRYS ER 20 MEQ PO TBCR
40.0000 meq | EXTENDED_RELEASE_TABLET | Freq: Once | ORAL | Status: AC
Start: 2011-06-03 — End: 2011-06-03
  Administered 2011-06-03: 40 meq via ORAL
  Filled 2011-06-03: qty 2

## 2011-06-03 NOTE — Progress Notes (Addendum)
Pt was complaining of SOB. Craige Cotta, NP was notified. NP stated to continue to monitor and to give lasix. Craige Cotta, NP was also notified that pt has a K of 2.9. NP stated not to give any K but do continue to monitor and put on telemetry monitor at [id number] Kathee Polite, RN

## 2011-06-03 NOTE — Progress Notes (Signed)
INITIAL ADULT NUTRITION ASSESSMENT Date: 06/03/2011   Time: 10:34 AM  Reason for Assessment: consult  ASSESSMENT: Female 60 y.o.  Dx: Shortness of breath  Hx:  Past Medical History  Diagnosis Date  . Gallstone pancreatitis   . Coronary artery disease     s/p CABG 2008 with multiple PCIs  . Ischemic cardiomyopathy   . End stage renal disease   . Diabetes mellitus   . Hypertension   . History of nonadherence to medical treatment   . LBBB (left bundle branch block)   . Hyperlipidemia   . Renal insufficiency   . CHF (congestive heart failure)     Related Meds:     . amLODipine  10 mg Oral Daily  . aspirin EC  81 mg Oral Daily  . carvedilol  25 mg Oral BID WC  . doxazosin  4 mg Oral QHS  . furosemide  20 mg Intravenous Once  . furosemide  40 mg Intravenous Once  . hydrALAZINE  25 mg Oral Daily  . insulin aspart  0-9 Units Subcutaneous TID WC  . insulin glargine  10 Units Subcutaneous Q0700  . isosorbide mononitrate  30 mg Oral Daily  . potassium chloride  40 mEq Oral Once  . ranolazine  500 mg Oral Daily  . sevelamer  3,200 mg Oral TID WC  . simvastatin  10 mg Oral q1800  . sodium chloride  3 mL Intravenous Q12H  . torsemide  40 mg Oral Daily     Ht: 5\' 4"  (162.6 cm)  Wt: 156 lb (70.761 kg)  Ideal Wt: 54.5 % Ideal Wt: 130%  Usual Wt: 153 lbs (69.5 kg) % Usual Wt: 102%  Body mass index is 26.78 kg/(m^2).  Food/Nutrition Related Hx: Patient reports losing 9 lbs in past month due to poor appetite.    Labs:  CMP     Component Value Date/Time   NA 138 06/03/2011 0615   K 2.7* 06/03/2011 0615   CL 100 06/03/2011 0615   CO2 24 06/03/2011 0615   GLUCOSE 121* 06/03/2011 0615   BUN 42* 06/03/2011 0615   CREATININE 8.41* 06/03/2011 0615   CALCIUM 11.1* 06/03/2011 0615   PROT 7.3 06/03/2011 0615   ALBUMIN 3.5 06/03/2011 0615   AST 13 06/03/2011 0615   ALT 9 06/03/2011 0615   ALKPHOS 60 06/03/2011 0615   BILITOT 0.4 06/03/2011 0615   GFRNONAA 5*  06/03/2011 0615   GFRAA 5* 06/03/2011 0615   I/O last 3 completed shifts: In: 3 [I.V.:3] Out: -     Diet Order: Carb Control  Supplements/Tube Feeding: n/a  IVF:   Estimated Nutritional Needs:   Kcal:1800-2000 Protein: 85-90 gm Fluid: maintain fluid balance  Patient reports a poor appetite for ~1 month, weight loss of 9  lbs.  Current weight is above usual body weight possibly due to fluid. Patient states that she feels her appetite has improved and declines need for supplements.  NUTRITION DIAGNOSIS: -Inadequate oral intake (NI-2.1).  Status: Ongoing  RELATED TO: poor appetite  AS EVIDENCE BY:  Patient reported 9 lb weight loss in past month.   MONITORING/EVALUATION(Goals): Goal: PO intake will be > 75% of most meals. Monitor: PO intake, weights, labs  EDUCATION NEEDS: -No education needs identified at this time  INTERVENTION: 1. Encouraged patient to eat as able .  2. If patient intake does not improve will consider supplements.   Dietitian [phone]  DOCUMENTATION CODES Per approved criteria  -Not Applicable    Clarene Duke MARIE  06/03/2011, 10:34 AM

## 2011-06-03 NOTE — Consult Note (Signed)
Reason for Consult:ESRD, PD Referring Physician: Dr. Arnell [name] is an 60 y.o. female.  HPI: 60 yr old female with ESRD probably secondary to DM and HTN, on PD in [place], with Dr. Julian Hy.  Receives part of her care in [place] at the [place].  Apparently receive 2 units PRBC in [place] an evolved SOB shortly after. Does not go to [facility] so came here. Did not use 4.25, or repeated PD exchanges, only uses 2.5. Recurrent anemia and only on Q 2 wk EPO.  On 3 anti HTN and diuretics also.  Hx chronic anemia.    Hx pancreatitis an Pseudocyst. Hosp here X 4 in past 7 mon since started PD. Hx CAD, S/P CABG.   Dialyzes at home through [facility]  since April. Primary Nephrologist as above..   Past Medical History  Diagnosis Date  . Gallstone pancreatitis   . Coronary artery disease     s/p CABG 2008 with multiple PCIs  . Ischemic cardiomyopathy   . End stage renal disease   . Diabetes mellitus   . Hypertension   . History of nonadherence to medical treatment   . LBBB (left bundle branch block)   . Hyperlipidemia   . Renal insufficiency   . CHF (congestive heart failure)     Past Surgical History  Procedure Date  . Coronary artery bypass graft 2008  . Cholecystectomy     Family History  Problem Relation Age of Onset  . Coronary artery disease Mother   . Hypertension Mother   . Diabetes Mother   . Diabetes Sister     Social History:  reports that she quit smoking about 10 years ago. She does not have any smokeless tobacco history on file. She reports that she does not drink alcohol or use illicit drugs.  Allergies:  Allergies  Allergen Reactions  . Morphine And Related Anaphylaxis  . Codeine Itching  . Lisinopril     Language changed    Medications:  I have reviewed the patient's current medications. Prior to Admission:  Prescriptions prior to admission  Medication Sig Dispense Refill  . amLODipine (NORVASC) 10 MG tablet Take 10 mg by mouth daily.          [name] aspirin EC 81 MG tablet Take 81 mg by mouth daily.        . carvedilol (COREG) 25 MG tablet Take 25 mg by mouth 2 (two) times daily with a meal.        . doxazosin (CARDURA) 4 MG tablet Take 4 mg by mouth at bedtime.        . hydrALAZINE (APRESOLINE) 25 MG tablet Take 25 mg by mouth daily.        . insulin glargine (LANTUS) 100 UNIT/ML injection Inject 10 Units into the skin every morning.       . isosorbide mononitrate (IMDUR) 30 MG 24 hr tablet Take 30 mg by mouth daily.        . Multiple Vitamin (DAILY VITAMIN PO) Take by mouth daily.        . pravastatin (PRAVACHOL) 20 MG tablet Take 20 mg by mouth daily.        . ranolazine (RANEXA) 500 MG 12 hr tablet Take 500 mg by mouth daily.        . sevelamer (RENAGEL) 800 MG tablet Take 3,200 mg by mouth 3 (three) times daily with meals.       . torsemide (DEMADEX) 20 MG tablet Take 40  mg by mouth daily.             Results for orders placed during the hospital encounter of 06/02/11 (from the past 48 hour(s))  CBC     Status: Abnormal   Collection Time   06/02/11  7:10 PM      Component Value Range Comment   WBC 14.8 (*) 4.0 - 10.5 (K/uL)    RBC 3.26 (*) 3.87 - 5.11 (MIL/uL)    Hemoglobin 9.1 (*) 12.0 - 15.0 (g/dL)    HCT 40.9 (*) 81.1 - 46.0 (%)    MCV 84.7  78.0 - 100.0 (fL)    MCH 27.9  26.0 - 34.0 (pg)    MCHC 33.0  30.0 - 36.0 (g/dL)    RDW 91.4 (*) 78.2 - 15.5 (%)    Platelets 205  150 - 400 (K/uL)   BASIC METABOLIC PANEL     Status: Abnormal   Collection Time   06/02/11  7:10 PM      Component Value Range Comment   Sodium 137  135 - 145 (mEq/[name])    Potassium 2.9 (*) 3.5 - 5.1 (mEq/[name])    Chloride 98  96 - 112 (mEq/[name])    CO2 25  19 - 32 (mEq/[name])    Glucose, Bld 113 (*) 70 - 99 (mg/dL)    BUN 39 (*) 6 - 23 (mg/dL)    Creatinine, Ser 9.56 (*) 0.50 - 1.10 (mg/dL)    Calcium 21.3 (*) 8.4 - 10.5 (mg/dL)    GFR calc non Af Amer 5 (*) >90 (mL/min)    GFR calc Af Amer 5 (*) >90 (mL/min)   POCT I-STAT TROPONIN I     Status:  Normal   Collection Time   06/02/11  7:32 PM      Component Value Range Comment   Troponin i, poc 0.05  0.00 - 0.08 (ng/mL)    Comment 3            PRO B NATRIURETIC PEPTIDE     Status: Abnormal   Collection Time   06/02/11  8:34 PM      Component Value Range Comment   Pro B Natriuretic peptide (BNP) 31365.0 (*) 0 - 125 (pg/mL)   GLUCOSE, CAPILLARY     Status: Abnormal   Collection Time   06/03/11 12:01 AM      Component Value Range Comment   Glucose-Capillary 125 (*) 70 - 99 (mg/dL)   HEMOGLOBIN Y8M     Status: Normal   Collection Time   06/03/11 12:33 AM      Component Value Range Comment   Hemoglobin A1C 5.2  <5.7 (%)    Mean Plasma Glucose 103  <117 (mg/dL)   CARDIAC PANEL(CRET KIN+CKTOT+MB+TROPI)     Status: Normal   Collection Time   06/03/11 12:33 AM      Component Value Range Comment   Total CK 39  7 - 177 (U/[name])    CK, MB 2.0  0.3 - 4.0 (ng/mL)    Troponin I <0.30  <0.30 (ng/mL)    Relative Index RELATIVE INDEX IS INVALID  0.0 - 2.5    MRSA PCR SCREENING     Status: Normal   Collection Time   06/03/11 12:33 AM      Component Value Range Comment   MRSA by PCR NEGATIVE  NEGATIVE    COMPREHENSIVE METABOLIC PANEL     Status: Abnormal   Collection Time   06/03/11  6:15 AM  Component Value Range Comment   Sodium 138  135 - 145 (mEq/[name])    Potassium 2.7 (*) 3.5 - 5.1 (mEq/[name])    Chloride 100  96 - 112 (mEq/[name])    CO2 24  19 - 32 (mEq/[name])    Glucose, Bld 121 (*) 70 - 99 (mg/dL)    BUN 42 (*) 6 - 23 (mg/dL)    Creatinine, Ser 1.61 (*) 0.50 - 1.10 (mg/dL)    Calcium 09.6 (*) 8.4 - 10.5 (mg/dL)    Total Protein 7.3  6.0 - 8.3 (g/dL)    Albumin 3.5  3.5 - 5.2 (g/dL)    AST 13  0 - 37 (U/[name])    ALT 9  0 - 35 (U/[name])    Alkaline Phosphatase 60  39 - 117 (U/[name])    Total Bilirubin 0.4  0.3 - 1.2 (mg/dL)    GFR calc non Af Amer 5 (*) >90 (mL/min)    GFR calc Af Amer 5 (*) >90 (mL/min)   CBC     Status: Abnormal   Collection Time   06/03/11  6:15 AM      Component Value  Range Comment   WBC 13.9 (*) 4.0 - 10.5 (K/uL)    RBC 3.13 (*) 3.87 - 5.11 (MIL/uL)    Hemoglobin 9.0 (*) 12.0 - 15.0 (g/dL)    HCT 04.5 (*) 40.9 - 46.0 (%)    MCV 84.7  78.0 - 100.0 (fL)    MCH 28.8  26.0 - 34.0 (pg)    MCHC 34.0  30.0 - 36.0 (g/dL)    RDW 81.1 (*) 91.4 - 15.5 (%)    Platelets 229  150 - 400 (K/uL)   PHOSPHORUS     Status: Normal   Collection Time   06/03/11  6:15 AM      Component Value Range Comment   Phosphorus 4.2  2.3 - 4.6 (mg/dL)   GLUCOSE, CAPILLARY     Status: Abnormal   Collection Time   06/03/11  7:07 AM      Component Value Range Comment   Glucose-Capillary 118 (*) 70 - 99 (mg/dL)   CARDIAC PANEL(CRET KIN+CKTOT+MB+TROPI)     Status: Normal   Collection Time   06/03/11  8:20 AM      Component Value Range Comment   Total CK 40  7 - 177 (U/[name])    CK, MB 2.1  0.3 - 4.0 (ng/mL)    Troponin I <0.30  <0.30 (ng/mL)    Relative Index RELATIVE INDEX IS INVALID  0.0 - 2.5    GLUCOSE, CAPILLARY     Status: Abnormal   Collection Time   06/03/11 11:34 AM      Component Value Range Comment   Glucose-Capillary 117 (*) 70 - 99 (mg/dL)     Dg Chest 2 View  78/29/5621  *RADIOLOGY REPORT*  Clinical Data: Severe shortness of breath which occurred during a blood transfusion earlier today.  CHEST - 2 VIEW 06/02/2011:  Comparison: Two-view chest x-ray 02/01/2011, 01/13/2011, and 07/17/2009 [facility]  Findings: Prior sternotomy for CABG.  Cardiac silhouette markedly enlarged but stable.  Diffuse airspace opacity throughout both lungs, right greater than left, most confluent in the inferior right upper lobe.  No visible pleural effusions.  Thoracic aorta atherosclerotic and tortuous, unchanged.  Hilar and mediastinal contours otherwise unremarkable.  Visualized bony thorax intact.  IMPRESSION: Asymmetric airspace pulmonary edema, right greater than left, is favored over pneumonia.  Stable marked cardiomegaly.  Original Report Authenticated By:  Dorothy Sieving,  M.D.    @ROS @ Blood pressure 148/91, pulse 90, temperature 98.1 F (36.7 C), temperature source Oral, resp. rate 20, height 5\' 4"  (1.626 m), weight 70.761 kg (156 lb), SpO2 96.00%. @PHYSEXAMBYAGE2 @ Physical Examination: General appearance - alert, well appearing, and in no distress Eyes - pupils equal and reactive, extraocular eye movements intact, funduscopic exam normal, discs flat and sharp Mouth - mucous membranes moist, pharynx normal without lesions and edentulous Chest - rales noted in bases Heart - normal rate, regular rhythm, normal S1, S2, no murmurs, rubs, clicks or gallops, S1 and S2 normal, S4 present, systolic murmur Gr 2/6 at apex Abdomen - hepatomegaly liver down 4 cm PD cath Extremities - pedal edema 2  Dialysis regimen - 5 - 2 liter CCPD,no dwell, empty belly  Assessment/Plan:  1 ESRD: Vol xs, not adequate PD needs intense PD, cycler not avail, use hand, 4.25% 2 Vol x2 3 Hypertension: can lower meds with lower vol 4. Anemia of ESRD: need to assess 5. Metabolic Bone Disease: need record 6 Disjointed care P Education and adequate F/U needed, as well as above.  [name] 06/03/2011, 4:52 PM

## 2011-06-03 NOTE — Progress Notes (Signed)
06/03/11 NSG 1835 Pt. Being transferred to 6704, report called to [place], Charity fundraiser.  Pt. Skin intact at transfer, will transfer pt. In w/c after she eats dinner.   Forbes Cellar, RN

## 2011-06-03 NOTE — Progress Notes (Signed)
Subjective: Chart reviewed. Patient is on peritoneal dialysis at home. She went to the [place] yesterday and was transfused 2 units of packed red blood cells. She drove home and when she got into a parking lot she started experiencing worsening dyspnea and asked a neighbor to call 911. Since admission, patient indicates that her dyspnea is significantly better but seemed to have dyspnea with mild exertion. She denies chest pain.  Objective: Blood pressure 149/100, pulse 93, temperature 98.9 F (37.2 C), temperature source Oral, resp. rate 22, height 5\' 4"  (1.626 m), weight 70.761 kg (156 lb), SpO2 94.00%.  Intake/Output Summary (Last 24 hours) at 06/03/11 1307 Last data filed at 06/03/11 0900  Gross per 24 hour  Intake      3 ml  Output      0 ml  Net      3 ml   General exam: Sitting at the edge of the bed and in no obvious distress. Respiratory system: Basal fine crackles but otherwise clear to auscultation and no increased work of breathing. Cardiovascular system: First and second heart sounds heard, regular. No JVD or murmurs. Gastrointestinal system: Abdomen is nondistended, soft and normal bowel sounds heard. Nontender. Central nervous system: Alert and oriented. No focal neurological deficits.  Lab Results: Basic Metabolic Panel:  Basename 06/03/11 0615 06/02/11 1910  NA 138 137  K 2.7* 2.9*  CL 100 98  CO2 24 25  GLUCOSE 121* 113*  BUN 42* 39*  CREATININE 8.41* 8.12*  CALCIUM 11.1* 10.9*  MG -- --  PHOS 4.2 --   Liver Function Tests:  Basename 06/03/11 0615  AST 13  ALT 9  ALKPHOS 60  BILITOT 0.4  PROT 7.3  ALBUMIN 3.5   No results found for this basename: LIPASE:2,AMYLASE:2 in the last 72 hours No results found for this basename: AMMONIA:2 in the last 72 hours CBC:  Basename 06/03/11 0615 06/02/11 1910  WBC 13.9* 14.8*  NEUTROABS -- --  HGB 9.0* 9.1*  HCT 26.5* 27.6*  MCV 84.7 84.7  PLT 229 205   Cardiac Enzymes:  Basename 06/03/11 0820 06/03/11 0033    CKTOTAL 40 39  CKMB 2.1 2.0  CKMBINDEX -- --  TROPONINI <0.30 <0.30   CBG:  Basename 06/03/11 1134 06/03/11 0707 06/03/11 0001  GLUCAP 117* 118* 125*   Hemoglobin A1C:  Basename 06/03/11 0033  HGBA1C 5.2    Micro Results: Recent Results (from the past 240 hour(s))  MRSA PCR SCREENING     Status: Normal   Collection Time   06/03/11 12:33 AM      Component Value Range Status Comment   MRSA by PCR NEGATIVE  NEGATIVE  Final     Studies/Results: Dg Chest 2 View  06/02/2011  *RADIOLOGY REPORT*  Clinical Data: Severe shortness of breath which occurred during a blood transfusion earlier today.  CHEST - 2 VIEW 06/02/2011:  Comparison: Two-view chest x-ray 02/01/2011, 01/13/2011, and 07/17/2009 [facility].  Findings: Prior sternotomy for CABG.  Cardiac silhouette markedly enlarged but stable.  Diffuse airspace opacity throughout both lungs, right greater than left, most confluent in the inferior right upper lobe.  No visible pleural effusions.  Thoracic aorta atherosclerotic and tortuous, unchanged.  Hilar and mediastinal contours otherwise unremarkable.  Visualized bony thorax intact.  IMPRESSION: Asymmetric airspace pulmonary edema, right greater than left, is favored over pneumonia.  Stable marked cardiomegaly.  Original Report Authenticated By: Arnell Sieving, M.D.    Medications: Scheduled Meds:   . amLODipine  10 mg  Oral Daily  . aspirin EC  81 mg Oral Daily  . carvedilol  25 mg Oral BID WC  . doxazosin  4 mg Oral QHS  . furosemide  20 mg Intravenous Once  . furosemide  40 mg Intravenous Once  . hydrALAZINE  25 mg Oral Daily  . insulin aspart  0-9 Units Subcutaneous TID WC  . insulin glargine  10 Units Subcutaneous Q0700  . isosorbide mononitrate  30 mg Oral Daily  . potassium chloride  40 mEq Oral Once  . ranolazine  500 mg Oral Daily  . sevelamer  3,200 mg Oral TID WC  . simvastatin  10 mg Oral q1800  . sodium chloride  3 mL Intravenous Q12H  . torsemide   40 mg Oral Daily   Continuous Infusions:  PRN Meds:.acetaminophen, acetaminophen, ondansetron (ZOFRAN) IV, ondansetron  Assessment/Plan: 1. Dyspnea, likely secondary to volume overload (acute on chronic systolic congestive heart failure) from blood transfusion in the context of end-stage renal disease patient who is on peritoneal dialysis. Although clinically better, she still has dyspnea on exertion. She did get doses of IV Lasix. She is on torsemide by mouth. Consulted the nephrologist to see if she would need some rapid cycle peritoneal dialysis to offload some of the fluid. Patient not on ACE inhibitors or ARB secondary to renal failure 2. Hypokalemia: Single dose of potassium chloride 40 mEq by mouth and follow daily BMP. 3. History of coronary artery disease and chronic left bundle branch block, and ischemic cardiomyopathy: If there is no recent echo then we'll consider repeating her echocardiogram. Continue carvedilol, aspirin, Ranexa. 4. Anemia: Status post packed red blood cell transfusion at an outside hospital yesterday. Follow daily CBCs 5. Type 2 diabetes mellitus: Reasonably controlled inpatient.  2D echocardiogram in March of 2011 shows left frontal ejection fraction of 20-25% with diffuse hypokinesis of the left ventricular. We'll repeat 2-D echocardiogram   [name] 06/03/2011, 1:07 PM

## 2011-06-03 NOTE — Progress Notes (Signed)
  Echocardiogram 2D Echocardiogram has been performed.  Cathie Beams Deneen 06/03/2011, 4:54 PM

## 2011-06-03 NOTE — Progress Notes (Addendum)
CRITICAL VALUE ALERT  Critical value received:  K+  2.7  Date of notification:  06/03/11  Time of notification:  0833  Critical value read back: yes  Nurse who received alert:  Forbes Cellar, RN  MD notified (1st page):  Dr. Waymon Amato  Time of first page:  [phone]  MD notified (2nd page): Dr. Waymon Amato  Time of second page: [phone]  Responding MD: Dr. Waymon Amato  Time MD responded:  [phone]

## 2011-06-04 LAB — CBC
HCT: 22.4 % — ABNORMAL LOW (ref 36.0–46.0)
HCT: 24.6 % — ABNORMAL LOW (ref 36.0–46.0)
Hemoglobin: 7.4 g/dL — ABNORMAL LOW (ref 12.0–15.0)
Hemoglobin: 8.2 g/dL — ABNORMAL LOW (ref 12.0–15.0)
MCH: 28.5 pg (ref 26.0–34.0)
MCH: 28.8 pg (ref 26.0–34.0)
MCHC: 33 g/dL (ref 30.0–36.0)
MCHC: 33.3 g/dL (ref 30.0–36.0)
MCV: 86.2 fL (ref 78.0–100.0)
MCV: 86.3 fL (ref 78.0–100.0)
Platelets: 160 10*3/uL (ref 150–400)
Platelets: 174 10*3/uL (ref 150–400)
RBC: 2.6 MIL/uL — ABNORMAL LOW (ref 3.87–5.11)
RBC: 2.85 MIL/uL — ABNORMAL LOW (ref 3.87–5.11)
RDW: 19.1 % — ABNORMAL HIGH (ref 11.5–15.5)
RDW: 18.8 % — ABNORMAL HIGH (ref 11.5–15.5)
WBC: 7.3 10*3/uL (ref 4.0–10.5)
WBC: 8.6 10*3/uL (ref 4.0–10.5)

## 2011-06-04 LAB — COMPREHENSIVE METABOLIC PANEL WITH GFR
ALT: 8 U/L (ref 0–35)
AST: 11 U/L (ref 0–37)
Albumin: 2.8 g/dL — ABNORMAL LOW (ref 3.5–5.2)
Alkaline Phosphatase: 52 U/L (ref 39–117)
BUN: 52 mg/dL — ABNORMAL HIGH (ref 6–23)
CO2: 24 meq/L (ref 19–32)
Calcium: 11 mg/dL — ABNORMAL HIGH (ref 8.4–10.5)
Chloride: 101 meq/L (ref 96–112)
Creatinine, Ser: 8.63 mg/dL — ABNORMAL HIGH (ref 0.50–1.10)
GFR calc Af Amer: 5 mL/min — ABNORMAL LOW
GFR calc non Af Amer: 4 mL/min — ABNORMAL LOW
Glucose, Bld: 177 mg/dL — ABNORMAL HIGH (ref 70–99)
Potassium: 2.7 meq/L — CL (ref 3.5–5.1)
Sodium: 140 meq/L (ref 135–145)
Total Bilirubin: 0.4 mg/dL (ref 0.3–1.2)
Total Protein: 6.5 g/dL (ref 6.0–8.3)

## 2011-06-04 LAB — IRON AND TIBC
Saturation Ratios: 6 % — ABNORMAL LOW (ref 20–55)
TIBC: 280 ug/dL (ref 250–470)
UIBC: 262 ug/dL (ref 125–400)

## 2011-06-04 LAB — GLUCOSE, CAPILLARY
Glucose-Capillary: 143 mg/dL — ABNORMAL HIGH (ref 70–99)
Glucose-Capillary: 220 mg/dL — ABNORMAL HIGH (ref 70–99)

## 2011-06-04 LAB — PHOSPHORUS: Phosphorus: 4.6 mg/dL (ref 2.3–4.6)

## 2011-06-04 MED ORDER — FERUMOXYTOL INJECTION 510 MG/17 ML
510.0000 mg | Freq: Once | INTRAVENOUS | Status: AC
  Administered 2011-06-04: 510 mg via INTRAVENOUS
  Filled 2011-06-04 (×2): qty 17

## 2011-06-04 MED ORDER — POTASSIUM CHLORIDE CRYS ER 20 MEQ PO TBCR
40.0000 meq | EXTENDED_RELEASE_TABLET | Freq: Once | ORAL | Status: AC
  Administered 2011-06-04: 40 meq via ORAL
  Filled 2011-06-04: qty 2

## 2011-06-04 MED ORDER — RANITIDINE HCL 150 MG/10ML PO SYRP: 150.0000 mg | ORAL_SOLUTION | Freq: Two times a day (BID) | ORAL | Status: DC | PRN

## 2011-06-04 MED ORDER — FAMOTIDINE 20 MG PO TABS
20.0000 mg | ORAL_TABLET | Freq: Two times a day (BID) | ORAL | Status: DC | PRN
  Administered 2011-06-04: 20 mg via ORAL
  Filled 2011-06-04 (×2): qty 1

## 2011-06-04 MED ORDER — AMLODIPINE BESYLATE 5 MG PO TABS
5.0000 mg | ORAL_TABLET | Freq: Every day | ORAL | Status: DC
  Administered 2011-06-05: 5 mg via ORAL
  Filled 2011-06-04: qty 1

## 2011-06-04 MED ORDER — DELFLEX-LC/4.25% DEXTROSE 483 MOSM/L IP SOLN
INTRAPERITONEAL | Status: DC
  Administered 2011-06-04 – 2011-06-05 (×3): via INTRAPERITONEAL

## 2011-06-04 MED ORDER — POTASSIUM CHLORIDE CRYS ER 20 MEQ PO TBCR
40.0000 meq | EXTENDED_RELEASE_TABLET | Freq: Once | ORAL | Status: AC
  Administered 2011-06-04: 40 meq via ORAL

## 2011-06-04 NOTE — Progress Notes (Signed)
Subjective: Interval History: none.Breathing much better  Objective: Vital signs in last 24 hours: Temp:  [98 F (36.7 C)-98.2 F (36.8 C)] 98 F (36.7 C) (12/20 0910) Pulse Rate:  [74-90] 74  (12/20 0910) Resp:  [16-20] 18  (12/20 0910) BP: (112-148)/(62-91) 125/77 mmHg (12/20 0910) SpO2:  [83 %-100 %] 99 % (12/20 0910) Weight:  [70.353 kg (155 lb 1.6 oz)] 155 lb 1.6 oz (70.353 kg) (12/19 1929) Weight change: -0.408 kg (-14.4 oz)  Intake/Output from previous day:   Intake/Output this shift:    General appearance: alert, cooperative and pale Resp: clear to auscultation bilaterally Cardio: S1, S2 normal and systolic murmur: holosystolic 2/6, blowing at apex GI: pops FW, PD cath, liver down 4 cm  Lab Results:  [facility] 06/04/11 1047 06/04/11 0530  WBC 8.6 7.3  HGB 8.2* 7.4*  HCT 24.6* 22.4*  PLT 174 160   BMET:  Basename 06/04/11 0530 06/03/11 0615  NA 140 138  K 2.7* 2.7*  CL 101 100  CO2 24 24  GLUCOSE 177* 121*  BUN 52* 42*  CREATININE 8.63* 8.41*  CALCIUM 11.0* 11.1*   No results found for this basename: PTH:2 in the last 72 hours Iron Studies:  Basename 06/04/11 0530  IRON 18*  TIBC 280  TRANSFERRIN --  FERRITIN --    Studies/Results: Dg Chest 2 View  06/02/2011  *RADIOLOGY REPORT*  Clinical Data: Severe shortness of breath which occurred during a blood transfusion earlier today.  CHEST - 2 VIEW 06/02/2011:  Comparison: Two-view chest x-ray 02/01/2011, 01/13/2011, and 07/17/2009 [facility].  Findings: Prior sternotomy for CABG.  Cardiac silhouette markedly enlarged but stable.  Diffuse airspace opacity throughout both lungs, right greater than left, most confluent in the inferior right upper lobe.  No visible pleural effusions.  Thoracic aorta atherosclerotic and tortuous, unchanged.  Hilar and mediastinal contours otherwise unremarkable.  Visualized bony thorax intact.  IMPRESSION: Asymmetric airspace pulmonary edema, right greater than left, is  favored over pneumonia.  Stable marked cardiomegaly.  Original Report Authenticated By: Arnell Sieving, M.D.    I have reviewed the patient's current medications.  Assessment/Plan: 1 CRF UF well with 4.25, cont, prob with getting fluid. 2 BP can lessen meds 3 Low EF, get vol off 4 DM fair control 5 Anemia ? GI, very Fe defic, replete 6 ^ Ca. Follow p as above    LOS: 2 days   [name] 06/04/2011,12:01 PM

## 2011-06-04 NOTE — Consult Note (Signed)
Admit date: 06/02/2011 Referring Physician  Dr. Waymon Amato, [facility] Primary Physician   [place] [facility] [place] Primary Cardiologist  Dr. Yetta Barre, [facility]  Reason for Consultation  worsening LVEF by echo  ASSESSMENT: 1. Ischemic cardiomyopathy with LVEF less than 20% for several years. LVEF by echo this admission estimated to be 5-10%. The patient was admitted with acute on chronic systolic heart failure that is now improved back to baseline with diuresis and dialysis. No specific workup or further the evaluation of the drop in the EF is indicated as the echo evaluation is an estimated number and can have considerable intraobserver variability. There is not much difference between an EF of 10% and/or 20%.  2. Coronary atherosclerotic heart disease with coronary bypass surgery 2008 and subsequent percutaneous coronary interventions  3. End stage renal disease on chronic peritoneal dialysis  4. Diabetes mellitus  5. Left bundle branch block    PLAN: 1. Continue heart failure therapy including beta blocker and diuretics as tolerated by heart rate and blood pressure. Due to kidney failure ACE/ARB therapy and spironolactone are not good choices due to potential hyperkalemia.  2. Followup in the [facility] heart failure clinic. The patient explicitly states that she will not see my colleague Dr. Carolanne Grumbling again and that all of her heart care is done at the [facility]  3. Very difficult to counsel patient concerning management as she has her own concept of therapy and etiology for her current situation.   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 She is cared  for at the heart failure clinic of the [facility]. She has chronic significant anemia and was receiving blood transfusion at the [facility] when during the second unit she became short of breath. This led to hospitalization here at [facility]. With IV Lasix and peritoneal dialysis her breathing has improved. An echocardiogram was repeated and interpreted to show an EF of 5-10%. This is slightly lower than her last recollection of LV function which was an EF of 19% earlier this summer. She currently feels that she is back to her baseline.   PMH:   Past Medical History  Diagnosis Date  . Gallstone pancreatitis   . Coronary artery disease     s/p CABG 2008 with multiple PCIs  . Ischemic cardiomyopathy   . End stage renal disease   . Diabetes mellitus   . Hypertension   . History of nonadherence to medical treatment   . LBBB (left bundle branch block)   . Hyperlipidemia   . Renal insufficiency   . CHF (congestive heart failure)      PSH:   Past Surgical History  Procedure Date  . Coronary artery bypass graft 2008  . Cholecystectomy     Allergies:  Morphine and related; Codeine; and Lisinopril Prior to Admit Meds:   Prescriptions prior to admission  Medication Sig Dispense Refill  . amLODipine (NORVASC) 10 MG tablet Take 10 mg by mouth daily.        [name] aspirin EC 81 MG tablet Take 81 mg by mouth daily.        . carvedilol (COREG) 25 MG tablet Take 25 mg by mouth 2 (two)  times daily with a meal.        . doxazosin (CARDURA) 4 MG tablet Take 4 mg by mouth at bedtime.        . hydrALAZINE (APRESOLINE) 25 MG tablet Take 25 mg by mouth daily.        . insulin glargine (LANTUS) 100 UNIT/ML injection Inject 10 Units into the skin every morning.       . isosorbide mononitrate (IMDUR) 30 MG 24 hr tablet Take 30 mg by mouth daily.        . Multiple Vitamin (DAILY VITAMIN PO) Take by mouth daily.        . pravastatin (PRAVACHOL) 20 MG tablet Take 20  mg by mouth daily.        . ranolazine (RANEXA) 500 MG 12 hr tablet Take 500 mg by mouth daily.        . sevelamer (RENAGEL) 800 MG tablet Take 3,200 mg by mouth 3 (three) times daily with meals.       . torsemide (DEMADEX) 20 MG tablet Take 40 mg by mouth daily.        Fam HX:    Family History  Problem Relation Age of Onset  . Coronary artery disease Mother   . Hypertension Mother   . Diabetes Mother   . Diabetes Sister    Social HX:    History   Social History  . Marital Status: Single    Spouse Name: N/A    Number of Children: N/A  . Years of Education: N/A   Occupational History  . Not on file.   Social History Main Topics  . Smoking status: Former Smoker    Quit date: 06/15/2000  . Smokeless tobacco: Not on file  . Alcohol Use: No  . Drug Use: No  . Sexually Active: Not on file   Other Topics Concern  . Not on file   Social History Narrative   The patient lives alone in [place].  She is a retired Engineer, civil (consulting).  She is disabled and receives most of her care from the [place], [place].       Review of Systems: No relevant review of systems other than as noted above.  Physical Exam: Blood pressure 126/84, pulse 87, temperature 98.5 F (36.9 C), temperature source Oral, resp. rate 20, height 5\' 4"  (1.626 m), weight 70.353 kg (155 lb 1.6 oz), SpO2 92.00%. Weight change: -0.408 kg (-14.4 oz)   Moderate JVD with the patient sitting at 90. Lungs are clear. Cardiac exam reveals a summation gallop along the left lower sternal border. 2 of 6 systolic murmur is heard at the apex. There is no peripheral edema. She does have an indwelling load of peritoneal fluid at this time. Abdomen is nontender. Neuro exam is unremarkable. She is alert and oriented x3. Labs:   Lab Results  Component Value Date   WBC 8.6 06/04/2011   HGB 8.2* 06/04/2011   HCT 24.6* 06/04/2011   MCV 86.3 06/04/2011   PLT 174 06/04/2011    Lab 06/04/11 0530  NA 140  K 2.7*  CL 101  CO2 24  BUN 52*    CREATININE 8.63*  CALCIUM 11.0*  PROT 6.5  BILITOT 0.4  ALKPHOS 52  ALT 8  AST 11  GLUCOSE 177*   No results found for this basename: PTT   Lab Results  Component Value Date   INR 1.18 01/13/2011   INR 1.08 11/09/2010   INR 1.2 12/08/2006   Lab Results  Component Value Date   CKTOTAL 37 06/03/2011   CKMB 2.1 06/03/2011   TROPONINI <0.30 06/03/2011     Lab Results  Component Value Date   CHOL 162 11/10/2010   CHOL  Value: 165        ATP III CLASSIFICATION:  <200     mg/dL   Desirable  161-096  mg/dL   Borderline High  >=045    mg/dL   High        4/0/9811   Lab Results  Component Value Date   HDL 85 11/10/2010   HDL 77 02/13/4781   Lab Results  Component Value Date   LDLCALC  Value: 56        Total Cholesterol/HDL:CHD Risk Coronary Heart Disease Risk Table                     Men   Women  1/2 Average Risk   3.4   3.3  Average Risk       5.0   4.4  2 X Average Risk   9.6   7.1  3 X Average Risk  23.4   11.0        Use the calculated Patient Ratio above and the CHD Risk Table to determine the patient's CHD Risk.        ATP III CLASSIFICATION (LDL):  <100     mg/dL   Optimal  956-213  mg/dL   Near or Above                    Optimal  130-159  mg/dL   Borderline  086-578  mg/dL   High  >469     mg/dL   Very High 12/12/5282   LDLCALC  Value: 57        Total Cholesterol/HDL:CHD Risk Coronary Heart Disease Risk Table                     Men   Women  1/2 Average Risk   3.4   3.3  Average Risk       5.0   4.4  2 X Average Risk   9.6   7.1  3 X Average Risk  23.4   11.0        Use the calculated Patient Ratio above and the CHD Risk Table to determine the patient's CHD Risk.        ATP III CLASSIFICATION (LDL):  <100     mg/dL   Optimal  132-440  mg/dL   Near or Above                    Optimal  130-159  mg/dL   Borderline  102-725  mg/dL   High  >366     mg/dL   Very High 09/16/345   Lab Results  Component Value Date   TRIG 103 11/10/2010   TRIG 156* 07/18/2009   Lab Results  Component Value  Date   CHOLHDL 1.9 11/10/2010   CHOLHDL 2.1 07/18/2009   No results found for this basename: LDLDIRECT      Radiology:  Dg Chest 2 View  06/02/2011  *RADIOLOGY REPORT*  Clinical Data: Severe shortness of breath which occurred during a blood transfusion earlier today.  CHEST - 2 VIEW 06/02/2011:  Comparison: Two-view chest x-ray 02/01/2011, 01/13/2011, and 07/17/2009 [facility].  Findings: Prior sternotomy for CABG.  Cardiac silhouette markedly enlarged but stable.  Diffuse airspace opacity  throughout both lungs, right greater than left, most confluent in the inferior right upper lobe.  No visible pleural effusions.  Thoracic aorta atherosclerotic and tortuous, unchanged.  Hilar and mediastinal contours otherwise unremarkable.  Visualized bony thorax intact.  IMPRESSION: Asymmetric airspace pulmonary edema, right greater than left, is favored over pneumonia.  Stable marked cardiomegaly.  Original Report Authenticated By: Arnell Sieving, M.D.   EKG:  Sinus rhythm and sinus tachycardia with left axis deviation, left bundle-branch block, and    Lesleigh Noe 06/04/2011 5:39 PM

## 2011-06-04 NOTE — Progress Notes (Signed)
Subjective: Patient says her dyspnea is much improved. She says she was still dyspneic yesterday because she did not caught in her peritoneal dialysis yet.  Patient's primary cardiologist is Dr. Yetta Barre in the [facility]. She is followed at the heart failure clinic. She has history of CABG in 2008 followed by PCI 2 months later. She has had 5 coronary angiograms before the CABG. According to her, her most recent stress test in October of 2012 showed a left ventricular ejection fraction of 19%. She is waiting for a defibrillator placement. She was advised a repeat coronary angiogram which she has declined due to her fear of worsening renal functions.  Patient says she had a colonoscopy in 2011 which showed 3 polyps which were benign. She says she had an EGD in 2008 before her CABG which showed pancreatic pseudocyst.  Objective: Blood pressure 125/77, pulse 74, temperature 98 F (36.7 C), temperature source Oral, resp. rate 18, height 5\' 4"  (1.626 m), weight 70.353 kg (155 lb 1.6 oz), SpO2 99.00%.  Intake/Output Summary (Last 24 hours) at 06/04/11 1029 Last data filed at 06/04/11 0826  Gross per 24 hour  Intake      0 ml  Output      0 ml  Net      0 ml   General exam: Comfortable. Respiratory system: Clear and no increased work of breathing. Cardiovascular system: First and second heart sounds heard, regular. JVD present. No murmurs. Telemetry shows sinus rhythm. Gastrointestinal system: Abdomen is nondistended, soft and normal bowel sounds heard. Nontender. Central nervous system: Alert and oriented. No focal neurological deficits.  Lab Results: Basic Metabolic Panel:  Basename 06/04/11 0530 06/03/11 0615  NA 140 138  K 2.7* 2.7*  CL 101 100  CO2 24 24  GLUCOSE 177* 121*  BUN 52* 42*  CREATININE 8.63* 8.41*  CALCIUM 11.0* 11.1*  MG -- --  PHOS 4.6 4.2   Liver Function Tests:  Basename 06/04/11 0530 06/03/11 0615  AST 11 13  ALT 8 9  ALKPHOS 52 60  BILITOT 0.4 0.4  PROT 6.5  7.3  ALBUMIN 2.8* 3.5   No results found for this basename: LIPASE:2,AMYLASE:2 in the last 72 hours No results found for this basename: AMMONIA:2 in the last 72 hours CBC:  Basename 06/04/11 0530 06/03/11 0615  WBC 7.3 13.9*  NEUTROABS -- --  HGB 7.4* 9.0*  HCT 22.4* 26.5*  MCV 86.2 84.7  PLT 160 229   Cardiac Enzymes:  Basename 06/03/11 1605 06/03/11 0820 06/03/11 0033  CKTOTAL 37 40 39  CKMB 2.1 2.1 2.0  CKMBINDEX -- -- --  TROPONINI <0.30 <0.30 <0.30   CBG:  Basename 06/04/11 0825 06/03/11 1802 06/03/11 1134 06/03/11 0707 06/03/11 0001  GLUCAP 143* 79 117* 118* 125*   Hemoglobin A1C:  Basename 06/03/11 0033  HGBA1C 5.2    Micro Results: Recent Results (from the past 240 hour(s))  MRSA PCR SCREENING     Status: Normal   Collection Time   06/03/11 12:33 AM      Component Value Range Status Comment   MRSA by PCR NEGATIVE  NEGATIVE  Final     Studies/Results: Dg Chest 2 View  06/02/2011  *RADIOLOGY REPORT*  Clinical Data: Severe shortness of breath which occurred during a blood transfusion earlier today.  CHEST - 2 VIEW 06/02/2011:  Comparison: Two-view chest x-ray 02/01/2011, 01/13/2011, and 07/17/2009 [facility].  Findings: Prior sternotomy for CABG.  Cardiac silhouette markedly enlarged but stable.  Diffuse airspace opacity  throughout both lungs, right greater than left, most confluent in the inferior right upper lobe.  No visible pleural effusions.  Thoracic aorta atherosclerotic and tortuous, unchanged.  Hilar and mediastinal contours otherwise unremarkable.  Visualized bony thorax intact.  IMPRESSION: Asymmetric airspace pulmonary edema, right greater than left, is favored over pneumonia.  Stable marked cardiomegaly.  Original Report Authenticated By: Arnell Sieving, M.D.    Medications: Scheduled Meds:    . amLODipine  10 mg Oral Daily  . aspirin EC  81 mg Oral Daily  . carvedilol  25 mg Oral BID WC  . darbepoetin (ARANESP) injection -  DIALYSIS  200 mcg Subcutaneous Once  . doxazosin  4 mg Oral QHS  . hydrALAZINE  25 mg Oral Daily  . insulin aspart  0-9 Units Subcutaneous TID WC  . insulin glargine  10 Units Subcutaneous Q0700  . isosorbide mononitrate  30 mg Oral Daily  . multivitamin  1 tablet Oral Daily  . potassium chloride  40 mEq Oral Once  . ranolazine  500 mg Oral Daily  . sevelamer  3,200 mg Oral TID WC  . simvastatin  10 mg Oral q1800  . sodium chloride  3 mL Intravenous Q12H  . DISCONTD: torsemide  40 mg Oral Daily   Continuous Infusions:    . dialysis solution 4.25% low-MG     PRN Meds:.acetaminophen, acetaminophen, ondansetron (ZOFRAN) IV, ondansetron  Assessment/Plan: 1. Acute on chronic systolic congestive heart failure precipitated by volume overload from blood transfusion in the context of cardiomyopathy (possibly ischemic) and end-stage renal disease. Repeat 2-D echocardiogram shows an estimated left ventricular ejection fraction of 5-10%, severely dilated left ventricle, severely reduced left ventricle systolic function and diffuse hypokinesis, severe mitral valve regurgitation. She is on torsemide by mouth. Continue peritoneal dialysis. Cardiology consulted for her worsened left ventricular ejection fraction. Requested nursing to try and obtain her cardiology office records from the [facility]. 2. Hypokalemia: Single dose of potassium chloride 40 mEq by mouth and follow daily BMP. 3. History of coronary artery disease and chronic left bundle branch block, and ischemic cardiomyopathy: Cardiology consulted. Continue carvedilol, aspirin, Ranexa. 4. Anemia: Status post packed red blood cell transfusion at an outside hospital on the day of admission. Significant drop in her hemoglobin compared to yesterday. No clear indication of bleeding. We'll repeat stat and follow. We'll also check stool for occult blood. 5. Type 2 diabetes mellitus: Reasonably controlled inpatient.     HONGALGI,ANAND 06/04/2011,  10:29 AM

## 2011-06-04 NOTE — Progress Notes (Signed)
06/04/2011  4:59 PM  Attempted to receive consent for transfer of medical records (especially cardiology records per Dr. Richardean Chimera request) from the [facility] to this facility.  Pt declined to sign consent for the transfer, stating that all of her information should be on file here from her last stay, and that none of her information has changed since then.  Pt also stated that she is hoping to transfer to the [facility] if she can.  Eunice Blase

## 2011-06-05 LAB — RENAL FUNCTION PANEL
Albumin: 3.3 g/dL — ABNORMAL LOW (ref 3.5–5.2)
CO2: 25 mEq/L (ref 19–32)
Calcium: 11.8 mg/dL — ABNORMAL HIGH (ref 8.4–10.5)
Creatinine, Ser: 7.64 mg/dL — ABNORMAL HIGH (ref 0.50–1.10)
GFR calc Af Amer: 6 mL/min — ABNORMAL LOW (ref >90)
GFR calc non Af Amer: 5 mL/min — ABNORMAL LOW (ref >90)
Phosphorus: 4.5 mg/dL (ref 2.3–4.6)
Sodium: 136 mEq/L (ref 135–145)

## 2011-06-05 LAB — CBC
MCH: 28.4 pg (ref 26.0–34.0)
MCHC: 32.8 g/dL (ref 30.0–36.0)
MCV: 86.6 fL (ref 78.0–100.0)
Platelets: 211 10*3/uL (ref 150–400)
RBC: 3.13 MIL/uL — ABNORMAL LOW (ref 3.87–5.11)
RDW: 18.6 % — ABNORMAL HIGH (ref 11.5–15.5)

## 2011-06-05 MED ORDER — HYDROCODONE-ACETAMINOPHEN 10-325 MG PO TABS
1.0000 | ORAL_TABLET | Freq: Once | ORAL | Status: AC
  Administered 2011-06-05: 1 via ORAL

## 2011-06-05 NOTE — Progress Notes (Signed)
   CARE MANAGEMENT NOTE 06/05/2011  Patient:  [name], [name]   Account Number:  [id number]  Date Initiated:  06/05/2011  Documentation initiated by:  Letha Cape  Subjective/Objective Assessment:   dx esrd  admit- lives alone, pta independent.     Action/Plan:   Anticipated DC Date:  06/05/2011   Anticipated DC Plan:  HOME/SELF CARE      DC Planning Services  CM consult      Choice offered to / List presented to:             Status of service:  Completed, signed off Medicare Important Message given?   (If response is "NO", the following Medicare IM given date fields will be blank) Date Medicare IM given:   Date Additional Medicare IM given:    Discharge Disposition:  HOME/SELF CARE  Per UR Regulation:    Comments:  06/05/11 15:10 Letha Cape RN, BSN [phone] patient lives alone, pta independent, patient has sister that lives in town and her children lives out of state. Patient is for dc today has no needs at this time.

## 2011-06-05 NOTE — Progress Notes (Signed)
Patient medications and follow up appts reviewed. Understanding verbalized. Condition stable upon departure.

## 2011-06-05 NOTE — Discharge Summary (Signed)
PATIENT DETAILS Name: [name] Age: 60 y.o. Sex: female Date of Birth: 07/11/1950 MRN: [id number]. Admit Date: 06/02/2011 Admitting Physician: Talmage Nap, MD PCP:No primary provider on file.  PRIMARY DISCHARGE DIAGNOSIS:  Principal Problem:  Shortness of breath  Active Problems:  *End stage renal disease  Ischemic cardiomyopathy  Chronic systolic congestive heart failure  Hypertension       PAST MEDICAL HISTORY: Past Medical History  Diagnosis Date  . Gallstone pancreatitis   . Coronary artery disease     s/p CABG 2008 with multiple PCIs  . Ischemic cardiomyopathy   . End stage renal disease   . Diabetes mellitus   . Hypertension   . History of nonadherence to medical treatment   . LBBB (left bundle branch block)   . Hyperlipidemia   . Renal insufficiency   . CHF (congestive heart failure)     DISCHARGE MEDICATIONS: Current Discharge Medication List    CONTINUE these medications which have NOT CHANGED   Details  amLODipine (NORVASC) 10 MG tablet Take 10 mg by mouth daily.      aspirin EC 81 MG tablet Take 81 mg by mouth daily.      carvedilol (COREG) 25 MG tablet Take 25 mg by mouth 2 (two) times daily with a meal.      doxazosin (CARDURA) 4 MG tablet Take 4 mg by mouth at bedtime.      insulin glargine (LANTUS) 100 UNIT/ML injection Inject 10 Units into the skin every morning.     isosorbide mononitrate (IMDUR) 30 MG 24 hr tablet Take 30 mg by mouth daily.      Multiple Vitamin (DAILY VITAMIN PO) Take by mouth daily.      pravastatin (PRAVACHOL) 20 MG tablet Take 20 mg by mouth daily.      ranolazine (RANEXA) 500 MG 12 hr tablet Take 500 mg by mouth daily.      sevelamer (RENAGEL) 800 MG tablet Take 3,200 mg by mouth 3 (three) times daily with meals.       STOP taking these medications     hydrALAZINE (APRESOLINE) 25 MG tablet      torsemide (DEMADEX) 20 MG tablet          BRIEF HPI:  See H&P, Labs, Consult and Test reports for  all details in brief, patient was admitted for Shortness of breath after a PRBC transfusion.Please see H&P for further details.   CONSULTATIONS:   cardiology and nephrology  PERTINENT RADIOLOGIC STUDIES: Dg Chest 2 View  06/02/2011  *RADIOLOGY REPORT*  Clinical Data: Severe shortness of breath which occurred during a blood transfusion earlier today.  CHEST - 2 VIEW 06/02/2011:  Comparison: Two-view chest x-ray 02/01/2011, 01/13/2011, and 07/17/2009 [facility].  Findings: Prior sternotomy for CABG.  Cardiac silhouette markedly enlarged but stable.  Diffuse airspace opacity throughout both lungs, right greater than left, most confluent in the inferior right upper lobe.  No visible pleural effusions.  Thoracic aorta atherosclerotic and tortuous, unchanged.  Hilar and mediastinal contours otherwise unremarkable.  Visualized bony thorax intact.  IMPRESSION: Asymmetric airspace pulmonary edema, right greater than left, is favored over pneumonia.  Stable marked cardiomegaly.  Original Report Authenticated By: Arnell Sieving, M.D.     PERTINENT LAB RESULTS: CBC:  Basename 06/05/11 0540 06/04/11 1047  WBC 8.2 8.6  HGB 8.9* 8.2*  HCT 27.1* 24.6*  PLT 211 174   CMET CMP     Component Value Date/Time   NA 136 06/05/2011 0540  K 3.2* 06/05/2011 0540   CL 96 06/05/2011 0540   CO2 25 06/05/2011 0540   GLUCOSE 178* 06/05/2011 0540   BUN 43* 06/05/2011 0540   CREATININE 7.64* 06/05/2011 0540   CALCIUM 11.8* 06/05/2011 0540   PROT 6.5 06/04/2011 0530   ALBUMIN 3.3* 06/05/2011 0540   AST 11 06/04/2011 0530   ALT 8 06/04/2011 0530   ALKPHOS 52 06/04/2011 0530   BILITOT 0.4 06/04/2011 0530   GFRNONAA 5* 06/05/2011 0540   GFRAA 6* 06/05/2011 0540    GFR Estimated Creatinine Clearance: 7.5 ml/min (by C-G formula based on Cr of 7.64). No results found for this basename: LIPASE:2,AMYLASE:2 in the last 72 hours  Basename 06/03/11 1605 06/03/11 0820 06/03/11 0033  CKTOTAL 37 40 39   CKMB 2.1 2.1 2.0  CKMBINDEX -- -- --  TROPONINI <0.30 <0.30 <0.30   No components found with this basename: POCBNP:3 No results found for this basename: DDIMER:2 in the last 72 hours  Basename 06/03/11 0033  HGBA1C 5.2   No results found for this basename: CHOL:2,HDL:2,LDLCALC:2,TRIG:2,CHOLHDL:2,LDLDIRECT:2 in the last 72 hours No results found for this basename: TSH,T4TOTAL,FREET3,T3FREE,THYROIDAB in the last 72 hours  Basename 06/04/11 0530 06/03/11 1746  VITAMINB12 -- --  FOLATE -- --  FERRITIN -- --  TIBC 280 317  IRON 18* 15*  RETICCTPCT -- --   Coags: No results found for this basename: PT:2,INR:2 in the last 72 hours Microbiology: Recent Results (from the past 240 hour(s))  MRSA PCR SCREENING     Status: Normal   Collection Time   06/03/11 12:33 AM      Component Value Range Status Comment   MRSA by PCR NEGATIVE  NEGATIVE  Final      BRIEF HOSPITAL COURSE:   Principal Problem:  Shortness of breath Acute on chronic systolic congestive heart failure precipitated by volume overload from blood transfusion in the context of cardiomyopathy (possibly ischemic) and end-stage renal disease. Repeat 2-D echocardiogram shows an estimated left ventricular ejection fraction of 5-10%, severely dilated left ventricle, severely reduced left ventricle systolic function and diffuse hypokinesis, severe mitral valve regurgitation. She is on torsemide by mouth. Continue peritoneal dialysis. Cardiology was consulted, at this point they have recommended No specific workup or further the evaluation of the drop in the EF is indicated as the echo evaluation is an estimated number and can have considerable intraobserver variability. Pt to Followup in the [facility] heart failure clinic  Active Problems:  *End stage renal disease Patient will resume Peritoneal Dialysis at home   Ischemic cardiomyopathy -F/u at Heart Failure clinic at [facility]   DM -Resume Lantus on  discharge    TODAY-DAY OF DISCHARGE:  Subjective:   [name] today has no headache,no chest abdominal pain,no new weakness tingling or numbness, feels much better wants to go home today.   Objective:   Blood pressure 109/70, pulse 70, temperature 98.4 F (36.9 C), temperature source Oral, resp. rate 18, height 5\' 4"  (1.626 m), weight 71.6 kg (157 lb 13.6 oz), SpO2 91.00%.  Intake/Output Summary (Last 24 hours) at 06/05/11 1115 Last data filed at 06/05/11 0844  Gross per 24 hour  Intake    360 ml  Output      0 ml  Net    360 ml    Exam Awake Alert, Oriented *3, No new F.N deficits, Normal affect .AT,PERRAL Supple Neck,No JVD, No cervical lymphadenopathy appriciated.  Symmetrical Chest wall movement, Good air movement bilaterally, CTAB RRR,No Gallops,Rubs or new  Murmurs, No Parasternal Heave +ve B.Sounds, Abd Soft, Non tender, No organomegaly appriciated, No rebound -guarding or rigidity. No Cyanosis, Clubbing or edema, No new Rash or bruise  DISPOSITION: Home  DISCHARGE INSTRUCTIONS:    Follow-up Information    Follow up with [facility]. Make an appointment in 1 week.      Make an appointment in 5 days to follow up. (PrimaryNephrologist)          Total Time spent on discharge equals 45 minutes.  SignedJeoffrey Massed 06/05/2011 11:15 AM

## 2011-06-05 NOTE — Progress Notes (Signed)
Subjective: Interval History: doing better with less fluid  Objective: Vital signs in last 24 hours: Temp:  [97.9 F (36.6 C)-98.5 F (36.9 C)] 98.4 F (36.9 C) (12/21 0621) Pulse Rate:  [65-87] 70  (12/21 0621) Resp:  [16-20] 18  (12/21 0621) BP: (109-159)/(67-84) 109/70 mmHg (12/21 0621) SpO2:  [91 %-95 %] 91 % (12/21 0621) Weight:  [71.6 kg (157 lb 13.6 oz)] 157 lb 13.6 oz (71.6 kg) (12/20 2022) Weight change: 1.247 kg (2 lb 12 oz)  Intake/Output from previous day: 12/20 0701 - 12/21 0700 In: 240 [P.O.:240] Out: -  Intake/Output this shift: Total I/O In: 120 [P.O.:120] Out: -   General appearance: alert, cooperative and no distress Resp: clear to auscultation bilaterally Cardio: S1, S2 normal and systolic murmur: holosystolic 2/6, blowing at apex GI: pos FW, PD cath, pos bs  Lab Results:  [facility] 06/05/11 0540 06/04/11 1047  WBC 8.2 8.6  HGB 8.9* 8.2*  HCT 27.1* 24.6*  PLT 211 174   BMET:  Basename 06/05/11 0540 06/04/11 0530  NA 136 140  K 3.2* 2.7*  CL 96 101  CO2 25 24  GLUCOSE 178* 177*  BUN 43* 52*  CREATININE 7.64* 8.63*  CALCIUM 11.8* 11.0*   No results found for this basename: PTH:2 in the last 72 hours Iron Studies:  Basename 06/04/11 0530  IRON 18*  TIBC 280  TRANSFERRIN --  FERRITIN --    Studies/Results: No results found.  I have reviewed the patient's current medications.  Assessment/Plan: 1 CRF doing well with solute and less vol.  Discussed goal wgts a& BP ok to D/C 2 HTN can reduce meds 3 Anemia epo/fe 4 DM fair cntriol, watch on 4.25. 5 CM needs bp & vol control 6  P As above.     LOS: 3 days   Jermani Eberlein L 06/05/2011,10:47 AM

## 2011-06-16 HISTORY — PX: CARDIAC DEFIBRILLATOR PLACEMENT: SHX171

## 2011-06-16 HISTORY — PX: INSERT / REPLACE / REMOVE PACEMAKER: SUR710

## 2011-08-14 HISTORY — PX: PERITONEAL CATHETER INSERTION: SHX2223

## 2011-09-13 ENCOUNTER — Emergency Department (HOSPITAL_COMMUNITY): Payer: Non-veteran care

## 2011-09-13 ENCOUNTER — Inpatient Hospital Stay (HOSPITAL_COMMUNITY)
Admission: EM | Admit: 2011-09-13 | Discharge: 2011-09-20 | DRG: 438 | Disposition: A | Discharge status: Home / Self Care | Payer: Non-veteran care | Attending: Family Medicine | Admitting: Family Medicine

## 2011-09-13 ENCOUNTER — Encounter (HOSPITAL_COMMUNITY): Payer: Self-pay

## 2011-09-13 DIAGNOSIS — K862 Cyst of pancreas: Secondary | ICD-10-CM

## 2011-09-13 DIAGNOSIS — E119 Type 2 diabetes mellitus without complications: Secondary | ICD-10-CM | POA: Diagnosis present

## 2011-09-13 DIAGNOSIS — I447 Left bundle-branch block, unspecified: Secondary | ICD-10-CM | POA: Diagnosis present

## 2011-09-13 DIAGNOSIS — Z888 Allergy status to other drugs, medicaments and biological substances status: Secondary | ICD-10-CM

## 2011-09-13 DIAGNOSIS — N185 Chronic kidney disease, stage 5: Secondary | ICD-10-CM | POA: Diagnosis present

## 2011-09-13 DIAGNOSIS — Z79899 Other long term (current) drug therapy: Secondary | ICD-10-CM

## 2011-09-13 DIAGNOSIS — E785 Hyperlipidemia, unspecified: Secondary | ICD-10-CM | POA: Diagnosis present

## 2011-09-13 DIAGNOSIS — Z87891 Personal history of nicotine dependence: Secondary | ICD-10-CM

## 2011-09-13 DIAGNOSIS — K863 Pseudocyst of pancreas: Secondary | ICD-10-CM

## 2011-09-13 DIAGNOSIS — D649 Anemia, unspecified: Secondary | ICD-10-CM

## 2011-09-13 DIAGNOSIS — D62 Acute posthemorrhagic anemia: Secondary | ICD-10-CM

## 2011-09-13 DIAGNOSIS — I509 Heart failure, unspecified: Secondary | ICD-10-CM | POA: Diagnosis present

## 2011-09-13 DIAGNOSIS — K861 Other chronic pancreatitis: Secondary | ICD-10-CM | POA: Diagnosis present

## 2011-09-13 DIAGNOSIS — Z87892 Personal history of anaphylaxis: Secondary | ICD-10-CM

## 2011-09-13 DIAGNOSIS — E876 Hypokalemia: Secondary | ICD-10-CM | POA: Diagnosis not present

## 2011-09-13 DIAGNOSIS — Z66 Do not resuscitate: Secondary | ICD-10-CM | POA: Diagnosis present

## 2011-09-13 DIAGNOSIS — I255 Ischemic cardiomyopathy: Secondary | ICD-10-CM

## 2011-09-13 DIAGNOSIS — I2510 Atherosclerotic heart disease of native coronary artery without angina pectoris: Secondary | ICD-10-CM | POA: Diagnosis present

## 2011-09-13 DIAGNOSIS — K922 Gastrointestinal hemorrhage, unspecified: Secondary | ICD-10-CM | POA: Diagnosis present

## 2011-09-13 DIAGNOSIS — N186 End stage renal disease: Secondary | ICD-10-CM | POA: Diagnosis present

## 2011-09-13 DIAGNOSIS — K2970 Gastritis, unspecified, without bleeding: Secondary | ICD-10-CM | POA: Diagnosis present

## 2011-09-13 DIAGNOSIS — Z91199 Patient's noncompliance with other medical treatment and regimen due to unspecified reason: Secondary | ICD-10-CM

## 2011-09-13 DIAGNOSIS — Z950 Presence of cardiac pacemaker: Secondary | ICD-10-CM

## 2011-09-13 DIAGNOSIS — Z9119 Patient's noncompliance with other medical treatment and regimen: Secondary | ICD-10-CM

## 2011-09-13 DIAGNOSIS — I10 Essential (primary) hypertension: Secondary | ICD-10-CM

## 2011-09-13 DIAGNOSIS — I2589 Other forms of chronic ischemic heart disease: Secondary | ICD-10-CM | POA: Diagnosis present

## 2011-09-13 DIAGNOSIS — Z951 Presence of aortocoronary bypass graft: Secondary | ICD-10-CM

## 2011-09-13 DIAGNOSIS — Z9861 Coronary angioplasty status: Secondary | ICD-10-CM

## 2011-09-13 DIAGNOSIS — I120 Hypertensive chronic kidney disease with stage 5 chronic kidney disease or end stage renal disease: Secondary | ICD-10-CM | POA: Diagnosis present

## 2011-09-13 DIAGNOSIS — Z886 Allergy status to analgesic agent status: Secondary | ICD-10-CM

## 2011-09-13 DIAGNOSIS — K449 Diaphragmatic hernia without obstruction or gangrene: Secondary | ICD-10-CM | POA: Diagnosis present

## 2011-09-13 DIAGNOSIS — R0602 Shortness of breath: Secondary | ICD-10-CM

## 2011-09-13 DIAGNOSIS — I5022 Chronic systolic (congestive) heart failure: Secondary | ICD-10-CM | POA: Diagnosis present

## 2011-09-13 DIAGNOSIS — I951 Orthostatic hypotension: Secondary | ICD-10-CM | POA: Diagnosis not present

## 2011-09-13 LAB — LIPASE, BLOOD: Lipase: 147 U/L — ABNORMAL HIGH (ref 11–59)

## 2011-09-13 LAB — COMPREHENSIVE METABOLIC PANEL
ALT: 12 U/L (ref 0–35)
Alkaline Phosphatase: 59 U/L (ref 39–117)
BUN: 51 mg/dL — ABNORMAL HIGH (ref 6–23)
CO2: 27 mEq/L (ref 19–32)
GFR calc Af Amer: 5 mL/min — ABNORMAL LOW (ref >90)
GFR calc non Af Amer: 4 mL/min — ABNORMAL LOW (ref >90)
Glucose, Bld: 155 mg/dL — ABNORMAL HIGH (ref 70–99)
Potassium: 3.5 mEq/L (ref 3.5–5.1)
Sodium: 137 mEq/L (ref 135–145)

## 2011-09-13 LAB — CBC
Hemoglobin: 6.5 g/dL — CL (ref 12.0–15.0)
MCH: 31 pg (ref 26.0–34.0)
MCV: 96.2 fL (ref 78.0–100.0)
Platelets: 223 10*3/uL (ref 150–400)
RBC: 2.1 MIL/uL — ABNORMAL LOW (ref 3.87–5.11)
WBC: 9 10*3/uL (ref 4.0–10.5)

## 2011-09-13 LAB — PREPARE RBC (CROSSMATCH)

## 2011-09-13 LAB — DIFFERENTIAL
Eosinophils Absolute: 0 10*3/uL (ref 0.0–0.7)
Eosinophils Relative: 0 % (ref 0–5)
Lymphocytes Relative: 10 % — ABNORMAL LOW (ref 12–46)
Lymphs Abs: 0.9 10*3/uL (ref 0.7–4.0)
Monocytes Relative: 3 % (ref 3–12)

## 2011-09-13 LAB — TYPE AND SCREEN

## 2011-09-13 MED ORDER — HYDROMORPHONE HCL PF 1 MG/ML IJ SOLN
1.0000 mg | Freq: Once | INTRAMUSCULAR | Status: DC
  Filled 2011-09-13: qty 1

## 2011-09-13 MED ORDER — POTASSIUM CHLORIDE 10 MEQ/100ML IV SOLN
10.0000 meq | INTRAVENOUS | Status: AC
  Filled 2011-09-13 (×3): qty 100

## 2011-09-13 MED ORDER — PANTOPRAZOLE SODIUM 40 MG IV SOLR
40.0000 mg | INTRAVENOUS | Status: DC
  Administered 2011-09-13: 40 mg via INTRAVENOUS
  Filled 2011-09-13: qty 40

## 2011-09-13 MED ORDER — POTASSIUM CHLORIDE CRYS ER 20 MEQ PO TBCR
40.0000 meq | EXTENDED_RELEASE_TABLET | Freq: Two times a day (BID) | ORAL | Status: DC
  Filled 2011-09-13: qty 2

## 2011-09-13 MED ORDER — PANTOPRAZOLE SODIUM 40 MG PO TBEC
40.0000 mg | DELAYED_RELEASE_TABLET | Freq: Once | ORAL | Status: DC
  Filled 2011-09-13: qty 1

## 2011-09-13 MED ORDER — ONDANSETRON HCL 4 MG/2ML IJ SOLN
4.0000 mg | Freq: Once | INTRAMUSCULAR | Status: AC
  Administered 2011-09-13: 4 mg via INTRAVENOUS

## 2011-09-13 MED ORDER — HYDROMORPHONE HCL PF 1 MG/ML IJ SOLN
0.5000 mg | INTRAMUSCULAR | Status: DC | PRN
  Administered 2011-09-13 – 2011-09-14 (×5): 0.5 mg via INTRAVENOUS
  Filled 2011-09-13 (×6): qty 1

## 2011-09-13 MED ORDER — SODIUM CHLORIDE 0.9 % IJ SOLN
3.0000 mL | Freq: Two times a day (BID) | INTRAMUSCULAR | Status: DC
  Administered 2011-09-14 – 2011-09-19 (×8): 3 mL via INTRAVENOUS

## 2011-09-13 MED ORDER — RANOLAZINE ER 500 MG PO TB12
500.0000 mg | ORAL_TABLET | Freq: Every day | ORAL | Status: DC
  Administered 2011-09-14 – 2011-09-20 (×7): 500 mg via ORAL
  Filled 2011-09-13 (×7): qty 1

## 2011-09-13 MED ORDER — ONDANSETRON 4 MG PO TBDP
4.0000 mg | ORAL_TABLET | Freq: Once | ORAL | Status: DC
  Filled 2011-09-13: qty 1

## 2011-09-13 MED ORDER — SEVELAMER HCL 800 MG PO TABS
3200.0000 mg | ORAL_TABLET | Freq: Three times a day (TID) | ORAL | Status: DC
  Filled 2011-09-13 (×13): qty 4

## 2011-09-13 MED ORDER — HYDROMORPHONE HCL PF 1 MG/ML IJ SOLN
1.0000 mg | Freq: Once | INTRAMUSCULAR | Status: AC
  Administered 2011-09-13: 1 mg via INTRAVENOUS

## 2011-09-13 MED ORDER — CARVEDILOL 6.25 MG PO TABS
6.2500 mg | ORAL_TABLET | Freq: Two times a day (BID) | ORAL | Status: DC
  Administered 2011-09-14 – 2011-09-17 (×6): 6.25 mg via ORAL
  Filled 2011-09-13 (×9): qty 1

## 2011-09-13 MED ORDER — INSULIN ASPART 100 UNIT/ML ~~LOC~~ SOLN
0.0000 [IU] | Freq: Three times a day (TID) | SUBCUTANEOUS | Status: DC
  Administered 2011-09-15: 2 [IU] via SUBCUTANEOUS
  Administered 2011-09-16: 3 [IU] via SUBCUTANEOUS
  Administered 2011-09-17 – 2011-09-18 (×2): 2 [IU] via SUBCUTANEOUS
  Administered 2011-09-19: 3 [IU] via SUBCUTANEOUS
  Administered 2011-09-20: 2 [IU] via SUBCUTANEOUS

## 2011-09-13 MED ORDER — DOXAZOSIN MESYLATE 2 MG PO TABS
2.0000 mg | ORAL_TABLET | Freq: Every day | ORAL | Status: DC
  Administered 2011-09-14 – 2011-09-19 (×6): 2 mg via ORAL
  Filled 2011-09-13 (×8): qty 1

## 2011-09-13 MED ORDER — PANTOPRAZOLE SODIUM 40 MG PO TBEC
40.0000 mg | DELAYED_RELEASE_TABLET | Freq: Every day | ORAL | Status: DC
  Administered 2011-09-14 – 2011-09-15 (×2): 40 mg via ORAL
  Filled 2011-09-13 (×3): qty 1

## 2011-09-13 MED ORDER — IOHEXOL 300 MG/ML  SOLN
100.0000 mL | Freq: Once | INTRAMUSCULAR | Status: AC | PRN
  Administered 2011-09-13: 100 mL via INTRAVENOUS

## 2011-09-13 MED ORDER — SODIUM CHLORIDE 0.9 % IV SOLN
INTRAVENOUS | Status: DC
  Administered 2011-09-15: 19:00:00 via INTRAVENOUS
  Administered 2011-09-17: 20 mL/h via INTRAVENOUS

## 2011-09-13 MED ORDER — PANTOPRAZOLE SODIUM 40 MG IV SOLR
40.0000 mg | Freq: Once | INTRAVENOUS | Status: AC
  Administered 2011-09-13: 40 mg via INTRAVENOUS

## 2011-09-13 MED ORDER — PANTOPRAZOLE SODIUM 40 MG IV SOLR
INTRAVENOUS | Status: AC
  Filled 2011-09-13: qty 40

## 2011-09-13 MED ORDER — ONDANSETRON HCL 4 MG/2ML IJ SOLN
INTRAMUSCULAR | Status: AC
  Filled 2011-09-13: qty 2

## 2011-09-13 MED ORDER — ONDANSETRON HCL 4 MG/2ML IJ SOLN
4.0000 mg | Freq: Once | INTRAMUSCULAR | Status: AC
  Administered 2011-09-13: 4 mg via INTRAVENOUS
  Filled 2011-09-13: qty 2

## 2011-09-13 MED ORDER — ONDANSETRON HCL 4 MG/2ML IJ SOLN
4.0000 mg | Freq: Four times a day (QID) | INTRAMUSCULAR | Status: DC | PRN
  Administered 2011-09-13 – 2011-09-18 (×8): 4 mg via INTRAVENOUS
  Filled 2011-09-13 (×8): qty 2

## 2011-09-13 MED ORDER — SODIUM CHLORIDE 0.9 % IV SOLN
INTRAVENOUS | Status: DC
  Administered 2011-09-13: 16:00:00 via INTRAVENOUS

## 2011-09-13 NOTE — ED Notes (Signed)
Patient transported to CT 

## 2011-09-13 NOTE — ED Provider Notes (Signed)
History     CSN: [id number]  Arrival date & time 09/13/11  1425   First MD Initiated Contact with Patient 09/13/11 [phone]      Chief Complaint  Patient presents with  . Emesis    (Consider location/radiation/quality/duration/timing/severity/associated sxs/prior treatment) The history is provided by the patient.  Pt is 44 y female with dm, renal failure, htn, chf, hx pancreatic pseudocyst, hiatal hernia, and multiple prior abd surgeries.  She c/o n/v/hematemesis and abd pain since last night.  + light headed. No sob.  No diarrhea. No recent abxs.   No fever, chills, resp sxs.    Past Medical History  Diagnosis Date  . Gallstone pancreatitis   . Coronary artery disease     s/p CABG 2008 with multiple PCIs  . Ischemic cardiomyopathy   . End stage renal disease   . Diabetes mellitus   . Hypertension   . History of nonadherence to medical treatment   . LBBB (left bundle branch block)   . Hyperlipidemia   . Renal insufficiency   . CHF (congestive heart failure)   . Dialysis patient     Past Surgical History  Procedure Date  . Coronary artery bypass graft 2008  . Cholecystectomy     Family History  Problem Relation Age of Onset  . Coronary artery disease Mother   . Hypertension Mother   . Diabetes Mother   . Diabetes Sister     History  Substance Use Topics  . Smoking status: Former Smoker    Quit date: 06/15/2000  . Smokeless tobacco: Not on file  . Alcohol Use: No    OB History    Grav Para Term Preterm Abortions TAB SAB Ect Mult Living                  Review of Systems  Constitutional: Negative for fever and chills.  Respiratory: Negative for cough and shortness of breath.   Cardiovascular: Negative for chest pain.  Gastrointestinal: Positive for nausea, vomiting and abdominal pain. Negative for diarrhea and blood in stool.  Skin: Negative for pallor.  Neurological: Positive for dizziness and light-headedness. Negative for headaches.  Hematological:  Does not bruise/bleed easily.  Psychiatric/Behavioral: Negative for confusion.  All other systems reviewed and are negative.    Allergies  Morphine and related; Codeine; and Lisinopril  Home Medications   Current Outpatient Rx  Name Route Sig Dispense Refill  . ASPIRIN EC 81 MG PO TBEC Oral Take 81 mg by mouth daily.      [name] CARVEDILOL 25 MG PO TABS Oral Take 25 mg by mouth 2 (two) times daily with a meal.      . DOXAZOSIN MESYLATE 4 MG PO TABS Oral Take 4 mg by mouth at bedtime.      [name] HYDRALAZINE HCL 25 MG PO TABS Oral Take 25 mg by mouth 2 (two) times daily.    . INSULIN GLARGINE 100 UNIT/ML [place] SOLN Subcutaneous Inject 10 Units into the skin daily as needed. Per sliding scale    . ISOSORBIDE MONONITRATE ER 30 MG PO TB24 Oral Take 30 mg by mouth daily.      [name] DAILY VITAMIN PO Oral Take by mouth daily.      [name] PRAVASTATIN SODIUM 20 MG PO TABS Oral Take 20 mg by mouth daily.      [name] RANOLAZINE ER 500 MG PO TB12 Oral Take 500 mg by mouth daily.      [name] SEVELAMER HCL 800 MG PO TABS  Oral Take 3,200 mg by mouth 3 (three) times daily with meals.       BP 100/68  Pulse 100  Temp(Src) 97.4 F (36.3 C) (Oral)  Resp 18  Ht 5\' 4"  (1.626 m)  Wt 141 lb (63.957 kg)  BMI 24.20 kg/m2  Physical Exam  Vitals reviewed. Constitutional: She is oriented to person, place, and time. She appears well-developed and well-nourished. No distress.  HENT:  Head: Normocephalic and atraumatic.  Eyes: Conjunctivae are normal.  Neck: Normal range of motion. Neck supple.  Cardiovascular:  No murmur heard.      irregular  Pulmonary/Chest: Effort normal. No respiratory distress.  Abdominal: Soft. She exhibits no distension and no mass. There is tenderness. There is no guarding.       Mild diffuse abd ttp. No peritoneal signs  Genitourinary: Guaiac negative stool.       Rectal exam done with female chaperone  Musculoskeletal: Normal range of motion. She exhibits no edema.  Neurological: She is alert and  oriented to person, place, and time. No cranial nerve deficit.  Skin: Skin is warm and dry. No pallor.  Psychiatric: She has a normal mood and affect. Thought content normal.    ED Course  Procedures (including critical care time) 65 y female with n/v/hematemesis and abd pain since yest. No hx of pud. + hx hiatal hernia.  No abd distention but hx abd surgery.  Will check xr, labs, and tx with antiemetics and analgesics.    Labs Reviewed - No data to display No results found.   No diagnosis found.  GI bleed with increased anemia.  Hgb dropped 2 g/dL. Heme neg.    ED ECG REPORT   Date: 09/13/2011  EKG Time: 5:12 PM  Rate: 92  Rhythm: normal sinus rhythm,    Axis: right  Intervals:nonspecific intraventricular conduction delay  ST&T Change: nonspecific tw changes  Narrative Interpretation: nsr with right axis, nonspecific intraventricular conduction delay and nonspecific tw changes     6:22 PM Spoke with FP resident. She will admit for eval of gi bleed and anemia. Pt is not unstable so GI was not consulted in ed.       MDM  Gi bleed anemia        Cheri Guppy, MD 09/13/11 [phone]

## 2011-09-13 NOTE — ED Notes (Signed)
Pt drinking contrast and stating increased pain.  Given prn dilaudid.  Pt requesting nausea medication and requesting not to take po meds.  Dr Madolyn Frieze paged.

## 2011-09-13 NOTE — ED Notes (Signed)
IV team paged to place 2nd line

## 2011-09-13 NOTE — ED Notes (Signed)
Iv attempt x 2, unsuccessful. IV team called.

## 2011-09-13 NOTE — H&P (Signed)
[name] is an 61 y.o. female.   Chief Complaint: nausea/vomiting HPI: This is a 61 year old female presenting with above symptoms. They started 0300 am this morning. She noticed some blood in emesis after fourth vomiting episode around 1100 am and came to the ED. She is also complaining of diffuse abdominal pain, worse in the epigastric area. The pain is improved with Dilaudid.   She was recently hospitalized at the [facility] from 03/05 to 03/19 for similar symptoms. She receives most of her medical care there. She has been having nausea/vomiting for the past year. GI was consulted, and she underwent an EGD and was found to have a hiatal hernia. She was also told that her "pancreas was 10% of normal size" and that her chronic pancreatitis was causing inflammation around her intestines. She reports her symptoms spontaneously improved. She reports no nausea or vomiting since discharge until this morning.  She has a history of chronic pancreatitis. Several years ago, her pancreatitis was attributed to gallstones. She underwent laparoscopic cholecystectomy in 2003. Her symptoms improved afterwards and then returned about 1 year ago when she started peritoneal dialysis.   Past Medical History  Diagnosis Date  . Gallstone pancreatitis   . Coronary artery disease     s/p CABG 2008 with multiple PCIs  . Ischemic cardiomyopathy   . End stage renal disease   . Diabetes mellitus   . Hypertension   . History of nonadherence to medical treatment   . LBBB (left bundle branch block)   . Hyperlipidemia   . Renal insufficiency   . CHF (congestive heart failure)   . Dialysis patient     Past Surgical History  Procedure Date  . Coronary artery bypass graft 2008  . Cholecystectomy     Family History  Problem Relation Age of Onset  . Coronary artery disease Mother   . Hypertension Mother   . Diabetes Mother   . Diabetes Sister    Social History:  reports that she quit smoking about 11 years  ago. She does not have any smokeless tobacco history on file. She reports that she does not drink alcohol or use illicit drugs. She smoked 1/3 ppd about 10 years ago. She lives alone at home in [place].   Allergies:  Allergies  Allergen Reactions  . Morphine And Related Anaphylaxis  . Codeine Itching  . Lisinopril     Language changed    Medications Prior to Admission  Medication Dose Route Frequency Provider Last Rate Last Dose  . 0.9 %  sodium chloride infusion   Intravenous Continuous Shelly Rubenstein, MD 20 mL/hr at 09/13/11 1836 20 mL/hr at 09/13/11 1836  . HYDROmorphone (DILAUDID) injection 1 mg  1 mg Intravenous Once Cheri Guppy, MD   1 mg at 09/13/11 1604  . ondansetron (ZOFRAN) injection 4 mg  4 mg Intravenous Once Cheri Guppy, MD   4 mg at 09/13/11 1604  . ondansetron (ZOFRAN) injection 4 mg  4 mg Intravenous Once Cheri Guppy, MD   4 mg at 09/13/11 1841  . pantoprazole (PROTONIX) injection 40 mg  40 mg Intravenous Once Cheri Guppy, MD   40 mg at 09/13/11 1604  . DISCONTD: HYDROmorphone (DILAUDID) injection 1 mg  1 mg Intramuscular Once Cheri Guppy, MD      . DISCONTD: ondansetron (ZOFRAN-ODT) disintegrating tablet 4 mg  4 mg Oral Once Cheri Guppy, MD      . DISCONTD: pantoprazole (PROTONIX) EC tablet 40 mg  40 mg Oral  Once Cheri Guppy, MD       Medications Prior to Admission  Medication Sig Dispense Refill  . aspirin EC 81 MG tablet Take 81 mg by mouth daily.        . carvedilol (COREG) 25 MG tablet Take 25 mg by mouth 2 (two) times daily with a meal.        . doxazosin (CARDURA) 4 MG tablet Take 4 mg by mouth at bedtime.        . insulin glargine (LANTUS) 100 UNIT/ML injection Inject 10 Units into the skin daily as needed. Per sliding scale      . isosorbide mononitrate (IMDUR) 30 MG 24 hr tablet Take 30 mg by mouth daily.        . Multiple Vitamin (DAILY VITAMIN PO) Take by mouth daily.        . pravastatin (PRAVACHOL) 20 MG  tablet Take 20 mg by mouth daily.        . ranolazine (RANEXA) 500 MG 12 hr tablet Take 500 mg by mouth daily.        . sevelamer (RENAGEL) 800 MG tablet Take 3,200 mg by mouth 3 (three) times daily with meals.         Results for orders placed during the hospital encounter of 09/13/11 (from the past 48 hour(s))  CBC     Status: Abnormal   Collection Time   09/13/11  4:12 PM      Component Value Range Comment   WBC 9.0  4.0 - 10.5 (K/uL)    RBC 2.10 (*) 3.87 - 5.11 (MIL/uL)    Hemoglobin 6.5 (*) 12.0 - 15.0 (g/dL)    HCT 16.1 (*) 09.6 - 46.0 (%)    MCV 96.2  78.0 - 100.0 (fL)    MCH 31.0  26.0 - 34.0 (pg)    MCHC 32.2  30.0 - 36.0 (g/dL)    RDW 04.5 (*) 40.9 - 15.5 (%)    Platelets 223  150 - 400 (K/uL)   DIFFERENTIAL     Status: Abnormal   Collection Time   09/13/11  4:12 PM      Component Value Range Comment   Neutrophils Relative 87 (*) 43 - 77 (%)    Neutro Abs 7.8 (*) 1.7 - 7.7 (K/uL)    Lymphocytes Relative 10 (*) 12 - 46 (%)    Lymphs Abs 0.9  0.7 - 4.0 (K/uL)    Monocytes Relative 3  3 - 12 (%)    Monocytes Absolute 0.3  0.1 - 1.0 (K/uL)    Eosinophils Relative 0  0 - 5 (%)    Eosinophils Absolute 0.0  0.0 - 0.7 (K/uL)    Basophils Relative 0  0 - 1 (%)    Basophils Absolute 0.0  0.0 - 0.1 (K/uL)   COMPREHENSIVE METABOLIC PANEL     Status: Abnormal   Collection Time   09/13/11  4:12 PM      Component Value Range Comment   Sodium 137  135 - 145 (mEq/L)    Potassium 3.5  3.5 - 5.1 (mEq/L)    Chloride 96  96 - 112 (mEq/L)    CO2 27  19 - 32 (mEq/L)    Glucose, Bld 155 (*) 70 - 99 (mg/dL)    BUN 51 (*) 6 - 23 (mg/dL)    Creatinine, Ser 8.11 (*) 0.50 - 1.10 (mg/dL)    Calcium 9.8  8.4 - 10.5 (mg/dL)    Total Protein 5.4 (*) 6.0 -  8.3 (g/dL)    Albumin 2.4 (*) 3.5 - 5.2 (g/dL)    AST 13  0 - 37 (U/L)    ALT 12  0 - 35 (U/L)    Alkaline Phosphatase 59  39 - 117 (U/L)    Total Bilirubin 0.2 (*) 0.3 - 1.2 (mg/dL)    GFR calc non Af Amer 4 (*) >90 (mL/min)    GFR calc  Af Amer 5 (*) >90 (mL/min)   LIPASE, BLOOD     Status: Abnormal   Collection Time   09/13/11  4:12 PM      Component Value Range Comment   Lipase 147 (*) 11 - 59 (U/L)   PREPARE RBC (CROSSMATCH)     Status: Normal   Collection Time   09/13/11  5:14 PM      Component Value Range Comment   Order Confirmation ORDER PROCESSED BY BLOOD BANK     TYPE AND SCREEN     Status: Normal   Collection Time   09/13/11  5:14 PM      Component Value Range Comment   ABO/RH(D) O POS      Antibody Screen POS      Sample Expiration 09/16/2011      Antibody Identification ANTI-K      DAT, IgG NEG      Dg Abd Acute W/chest  09/13/2011  *RADIOLOGY REPORT*  Clinical Data: Nausea and vomiting.  ACUTE ABDOMEN SERIES (ABDOMEN 2 VIEW & CHEST 1 VIEW)  Comparison: CT scan 02/02/2011 and chest x-ray 06/02/2011.  Findings: The upright chest x-ray demonstrates pacer wires / AICD in good position without complicating features.  There are stable surgical changes from triple bypass surgery.  The heart is enlarged but unchanged.  Mild vascular congestion but no pulmonary edema or pleural effusion.  Two views of the abdomen demonstrate scattered air and stool throughout the colon and down to the rectum.  No distended small bowel loops to suggest obstruction.  No free air.  A peritoneal dialysis catheter is noted in the pelvis.  Stable surgical changes. The bony structures are intact.  IMPRESSION:  1.  No acute cardiopulmonary findings.  Stable cardiac enlargement. 2.  No plain film findings for an acute abdominal process.  Original Report Authenticated By: P. Loralie Champagne, M.D.    ROS Denies fevers/chills Denies chest pain/palpitations Denies shortness-of-breath Denies constipation/diarrhea/blood in stool  Blood pressure 112/70, pulse 81, temperature 98.2 F (36.8 C), temperature source Oral, resp. rate 18, height 5\' 4"  (1.626 m), weight 141 lb (63.957 kg), SpO2 98.00%. Physical Exam  Gen: NAD, sitting up in bed munching  on ice HEENT:   Head: [place]/AT   Eyes: PERRL, no scleral icterus   Ears: TM clear   Nose: no nasal congestion or rhinorrhea   Mouth: dry MM   Throat: no oropharyngeal erythema/exudates Neck: no LAD CV: RRR, no m/r/g Pulm: CTAB without w/r/r; decreased BS at bases; NI WOB Chest: well-healed scar from CABG; no TTP Abd: NABS, soft, NT, obese, non-distended Ext: no edema Neuro: alert and oriented, appropriate to questions; grossly intact; moves al extremities  Assessment/Plan This is a 61 year old African-American female with a history of chronic pancreatitis, nausea/vomiting for the past year, and ESRD on home peritoneal dialysis who presents with worsening nausea/vomiting.   Nausea/vomiting, hematemesis--the patient was recently hospitalized for this problem at the [facility] from 03/05-03/19.  CT-abd/pelvis 01/2011 (most recent one done at [facility]) showed pancreas with two peripancreatic fluid collections (likely pseudocysts) along body/tail with mild  peripancreatic stranding consistent with mild pancreatitis.  AXR without free air or other acute process.  -Lipase elevated at 147 (in the past 15mo ago, 260-270). However, patient afebrile and WBC WNL.   -LFTs not elevated.  -Will re-check CT-abd/pelvis.  -Dilaudid 0.5 q4hrs prn pain.   HEME/ONC Anemia--she has a longstanding history of anemia for several years. Previously, her anemia was attributed to heavy periods, however, her anemia has persisted with menopause. She receives periodic blood transfusions. The cause of her anemia is unknown. She has seen hematologists in the past. She thinks her chronic renal disease is the cause of her anemia. Her baseline hemoglobin is around 8. She receives Aranesp treatments weekly. During her recent hospitalization, she saw a GI specialist who performed an EGD. The only significant finding per patient was a hiatal hernia. Current anemia likely 2/2 upper GIB since presenting with hematemesis. Gastroccult  ordered; patient has not had any more episodes of vomiting since presentation to hospital.   -We will request records from the [facility] regarding patient's most recent hospitalization. -Hgb 6.5 today. Will give 2U PRBC and re-check CBC. Patient is asymptomatic from her anemia (no tachycardia, BP stable). -Hold home ASA -Iron studies -Follow-up gastroccult.   RENAL History of ESRD on peritoneal dialysis--secondary to poorly controlled hypertension and diabetes secondary to medical non-compliance. Started peritoneal dialysis about 1 year ago.  -Dialysis every 1-2 days. Electrolytes okay currently. Will hold off on dialysis today. Consult Renal in the AM if patient stays past 24-hours.  -Patient makes urine (about 1/2 cup daily).  -Home medications: renolazine, sevelamer>>>resume  CV History of hypertension--patient reports blood pressures not atypically 100s systolics.  History of severe chronic systolic heart failure--05/2011 TEE: EF 5-10%. Followed by heart failure cardiologist in [facility]. She was recently hospitalized 05/2011 for acute on chronic heart failure secondary to blood transfusion.  History of CAD s/p CABG 2008 with multiple PCI History of ischemic cardiomyopathy--although patient denies history of heart attack.  History of hyperlipidemia -Fluid restriction. Monitor for signs of fluid overload during transfusion.  -Home medications: Coreg 25 bid, doxazosin 4 qhs, hydralazine 25 bid/Imdur, pravastatin 20>>>hold hydralazine/Imdur, pravstatin. Continue doxazosin, Coreg at reduced dose.   ENDO History of T2DM--recent HgbA1c between 5-6. She uses insulin intermittently. It had been poorly controlled in the past. -Home medications: Lantus 10 prn>>>SSI  GI History of chronic pancreatitis--2003 attributed to gallstones. Improved following cholecystectomy but has been having symptoms (abdomina pain/nausea/vomiting) consistent with previous pancreatitis for the past year. 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   FEN/GI -IVF: NS @ KVO.  -Diet: NPO.   PPx: -DVT PPx: SCD (no heparin 2/2 anemia) -SUP: Protonix  Dispo: pending clinical improvement, stable Hgb following transfusion. If persistent anemia despite PRBC, consider transfer to [facility] for further evaluation since patient has been followed by heart failure and GI specialists there.   CODE: DNR/DNI   [place] PARK, Jaiven Graveline 09/13/2011, 7:12 PM

## 2011-09-13 NOTE — ED Notes (Signed)
Admitting MD at bedside.

## 2011-09-13 NOTE — ED Notes (Signed)
Per EMS, pt is from home. Started vomiting coffee ground emesis since 1100 this morning 200-300 mls. Denies any dark stools or blood in her stool. Also c/o abd pain. Pt is a dialysis patient and takes dialysis at home every night with access to left femoral.

## 2011-09-13 NOTE — ED Notes (Signed)
CT called to come pick up pt.

## 2011-09-14 ENCOUNTER — Encounter (HOSPITAL_COMMUNITY): Payer: Self-pay | Admitting: General Surgery

## 2011-09-14 DIAGNOSIS — K922 Gastrointestinal hemorrhage, unspecified: Secondary | ICD-10-CM

## 2011-09-14 DIAGNOSIS — K862 Cyst of pancreas: Secondary | ICD-10-CM

## 2011-09-14 DIAGNOSIS — K863 Pseudocyst of pancreas: Secondary | ICD-10-CM

## 2011-09-14 LAB — IRON AND TIBC
Iron: 96 ug/dL (ref 42–135)
Saturation Ratios: 38 % (ref 20–55)
TIBC: 253 ug/dL (ref 250–470)
UIBC: 157 ug/dL (ref 125–400)

## 2011-09-14 LAB — BASIC METABOLIC PANEL
BUN: 66 mg/dL — ABNORMAL HIGH (ref 6–23)
CO2: 23 mEq/L (ref 19–32)
Chloride: 89 mEq/L — ABNORMAL LOW (ref 96–112)
GFR calc Af Amer: 5 mL/min — ABNORMAL LOW (ref >90)
Potassium: 3.9 mEq/L (ref 3.5–5.1)

## 2011-09-14 LAB — PROTIME-INR: Prothrombin Time: 16.2 seconds — ABNORMAL HIGH (ref 11.6–15.2)

## 2011-09-14 LAB — GLUCOSE, CAPILLARY
Glucose-Capillary: 171 mg/dL — ABNORMAL HIGH (ref 70–99)
Glucose-Capillary: 127 mg/dL — ABNORMAL HIGH (ref 70–99)
Glucose-Capillary: 101 mg/dL — ABNORMAL HIGH (ref 70–99)

## 2011-09-14 LAB — PREALBUMIN: Prealbumin: 22 mg/dL (ref 17.0–34.0)

## 2011-09-14 LAB — CBC
Platelets: 200 10*3/uL (ref 150–400)
RBC: 2.89 MIL/uL — ABNORMAL LOW (ref 3.87–5.11)
WBC: 9.5 10*3/uL (ref 4.0–10.5)

## 2011-09-14 MED ORDER — HYDROMORPHONE HCL PF 1 MG/ML IJ SOLN
1.0000 mg | INTRAMUSCULAR | Status: DC | PRN
  Administered 2011-09-14 – 2011-09-19 (×18): 1 mg via INTRAVENOUS
  Filled 2011-09-14 (×18): qty 1

## 2011-09-14 MED ORDER — DELFLEX-LC/2.5% DEXTROSE 394 MOSM/L IP SOLN
INTRAPERITONEAL | Status: DC
  Administered 2011-09-14 – 2011-09-15 (×2): 5000 mL via INTRAPERITONEAL

## 2011-09-14 MED ORDER — DELFLEX-LC/1.5% DEXTROSE 346 MOSM/L IP SOLN
INTRAPERITONEAL | Status: AC
  Administered 2011-09-14: 5000 mL via INTRAPERITONEAL

## 2011-09-14 NOTE — Progress Notes (Signed)
Called to assist with transporting pt to [id number].  Patient transported via bed with monitor on.

## 2011-09-14 NOTE — Progress Notes (Signed)
I have seen and examined this patient. I have discussed with Dr Hairford.  I agree with their findings and plans as documented in their progress note for today.  

## 2011-09-14 NOTE — H&P (Signed)
[facility]MT[facility] Attending Admission Note: Dorothy Levy MD [phone] pager office [phone] I  have seen and examined this patient, reviewed their chart. I have discussed this patient with the resident. I agree with the resident's findings, assessment and care plan. On my rounds this morning she was beginning to have rectal bleeding of bright red blood. This is a new finding for her. She reported teh colonoscopy done last month at the [place] was negative except for some 'polyps". We will get those records. Place second large bore IV. She is currently hemodynamically stable and having almost no abdominal pain, just some cramping c/w bowel movement and mild TTP mid epigastric area. I think he condition could rapidly deteriorate however so we are watching closely. Surgery has been consulted. She has reported EF of 10% which complicates issues. I would not be in favor of transfer to [place] unless surgery thought  She would receive better care there. Await their consult. Id o not see any thing for GI to do acutely so we have not consulted them as of yet, however low threshold. Currently DNR code status and we are confirming. Will also consult renal as she is dialysis patient (peritoneal). She has been receiving Aranesp and IV iron as outpatient.

## 2011-09-14 NOTE — Progress Notes (Signed)
Reason for Consult:Peritoneal Dialysis management Referring Physician: Denny Levy, MD  [name] is an 61 y.o. female with ESRD who is admitted with complaint of nausea and vomiting and bloody emesis with ABLA.  She has a history of chronic pancreatitis.  Dialyzes via peritoneal dialysis since 2012. She recieves her treatments via the [facility]  .   We are asked to assist with dialysis management.   Past Medical History  Diagnosis Date  . Gallstone pancreatitis   . Coronary artery disease     s/p CABG 2008 with multiple PCIs  . Ischemic cardiomyopathy   . End stage renal disease   . Diabetes mellitus   . Hypertension   . History of nonadherence to medical treatment   . LBBB (left bundle branch block)   . Hyperlipidemia   . Renal insufficiency   . CHF (congestive heart failure)   . Dialysis patient   . Anemia     Past Surgical History  Procedure Date  . Coronary artery bypass graft 2008  . Cholecystectomy     Family History  Problem Relation Age of Onset  . Coronary artery disease Mother   . Hypertension Mother   . Diabetes Mother   . Diabetes Sister     Social History:  reports that she quit smoking about 11 years ago. She does not have any smokeless tobacco history on file. She reports that she does not drink alcohol or use illicit drugs.  Allergies:  Allergies  Allergen Reactions  . Morphine And Related Anaphylaxis  . Codeine Itching  . Lisinopril     Language changed    Medications: reviewed Anti-infectives    None      Results for orders placed during the hospital encounter of 09/13/11 (from the past 48 hour(s))  CBC     Status: Abnormal   Collection Time   09/13/11  4:12 PM      Component Value Range Comment   WBC 9.0  4.0 - 10.5 (K/uL)    RBC 2.10 (*) 3.87 - 5.11 (MIL/uL)    Hemoglobin 6.5 (*) 12.0 - 15.0 (g/dL)    HCT 95.6 (*) 21.3 - 46.0 (%)    MCV 96.2  78.0 - 100.0 (fL)    MCH 31.0  26.0 - 34.0 (pg)    MCHC 32.2  30.0 - 36.0 (g/dL)      RDW 08.6 (*) 57.8 - 15.5 (%)    Platelets 223  150 - 400 (K/uL)   DIFFERENTIAL     Status: Abnormal   Collection Time   09/13/11  4:12 PM      Component Value Range Comment   Neutrophils Relative 87 (*) 43 - 77 (%)    Neutro Abs 7.8 (*) 1.7 - 7.7 (K/uL)    Lymphocytes Relative 10 (*) 12 - 46 (%)    Lymphs Abs 0.9  0.7 - 4.0 (K/uL)    Monocytes Relative 3  3 - 12 (%)    Monocytes Absolute 0.3  0.1 - 1.0 (K/uL)    Eosinophils Relative 0  0 - 5 (%)    Eosinophils Absolute 0.0  0.0 - 0.7 (K/uL)    Basophils Relative 0  0 - 1 (%)    Basophils Absolute 0.0  0.0 - 0.1 (K/uL)   COMPREHENSIVE METABOLIC PANEL     Status: Abnormal   Collection Time   09/13/11  4:12 PM      Component Value Range Comment   Sodium 137  135 - 145 (  mEq/L)    Potassium 3.5  3.5 - 5.1 (mEq/L)    Chloride 96  96 - 112 (mEq/L)    CO2 27  19 - 32 (mEq/L)    Glucose, Bld 155 (*) 70 - 99 (mg/dL)    BUN 51 (*) 6 - 23 (mg/dL)    Creatinine, Ser 6.29 (*) 0.50 - 1.10 (mg/dL)    Calcium 9.8  8.4 - 10.5 (mg/dL)    Total Protein 5.4 (*) 6.0 - 8.3 (g/dL)    Albumin 2.4 (*) 3.5 - 5.2 (g/dL)    AST 13  0 - 37 (U/L)    ALT 12  0 - 35 (U/L)    Alkaline Phosphatase 59  39 - 117 (U/L)    Total Bilirubin 0.2 (*) 0.3 - 1.2 (mg/dL)    GFR calc non Af Amer 4 (*) >90 (mL/min)    GFR calc Af Amer 5 (*) >90 (mL/min)   LIPASE, BLOOD     Status: Abnormal   Collection Time   09/13/11  4:12 PM      Component Value Range Comment   Lipase 147 (*) 11 - 59 (U/L)   PREPARE RBC (CROSSMATCH)     Status: Normal   Collection Time   09/13/11  5:14 PM      Component Value Range Comment   Order Confirmation ORDER PROCESSED BY BLOOD BANK     TYPE AND SCREEN     Status: Normal (Preliminary result)   Collection Time   09/13/11  5:14 PM      Component Value Range Comment   ABO/RH(D) O POS      Antibody Screen POS      Sample Expiration 09/16/2011      Antibody Identification ANTI-K      DAT, IgG NEG      PT AG Type NEGATIVE FOR KELL ANTIGEN       Unit Number [id number]      Blood Component Type RED CELLS,LR      Unit division 00      Status of Unit ISSUED      Donor AG Type NEGATIVE FOR KELL ANTIGEN      Transfusion Status OK TO TRANSFUSE      Crossmatch Result COMPATIBLE      Unit Number [id number]      Blood Component Type RED CELLS,LR      Unit division 00      Status of Unit ISSUED      Donor AG Type NEGATIVE FOR KELL ANTIGEN      Transfusion Status OK TO TRANSFUSE      Crossmatch Result COMPATIBLE     TYPE AND SCREEN     Status: Normal   Collection Time   09/13/11  7:11 PM      Component Value Range Comment   ABO/RH(D) O POS      Antibody Screen POS      Sample Expiration 09/16/2011     IRON AND TIBC     Status: Normal   Collection Time   09/13/11  7:44 PM      Component Value Range Comment   Iron 96  42 - 135 (ug/dL)    TIBC 644  034 - 742 (ug/dL)    Saturation Ratios 38  20 - 55 (%)    UIBC 157  125 - 400 (ug/dL)   FERRITIN     Status: Abnormal   Collection Time   09/13/11  7:44 PM      Component  Value Range Comment   Ferritin 821 (*) 10 - 291 (ng/mL)   PREALBUMIN     Status: Normal   Collection Time   09/13/11  7:44 PM      Component Value Range Comment   Prealbumin 22.0  17.0 - 34.0 (mg/dL)   RETICULOCYTES     Status: Abnormal   Collection Time   09/13/11  8:12 PM      Component Value Range Comment   Retic Ct Pct 8.4 (*) 0.4 - 3.1 (%)    RBC. 2.07 (*) 3.87 - 5.11 (MIL/uL)    Retic Count, Manual 173.9  19.0 - 186.0 (K/uL)   GLUCOSE, CAPILLARY     Status: Abnormal   Collection Time   09/14/11  7:44 AM      Component Value Range Comment   Glucose-Capillary 171 (*) 70 - 99 (mg/dL)   MRSA PCR SCREENING     Status: Normal   Collection Time   09/14/11  8:49 AM      Component Value Range Comment   MRSA by PCR NEGATIVE  NEGATIVE    GLUCOSE, CAPILLARY     Status: Abnormal   Collection Time   09/14/11 11:17 AM      Component Value Range Comment   Glucose-Capillary 127 (*) 70 - 99 (mg/dL)   CBC     Status:  Abnormal   Collection Time   09/14/11 11:27 AM      Component Value Range Comment   WBC 9.5  4.0 - 10.5 (K/uL)    RBC 2.89 (*) 3.87 - 5.11 (MIL/uL)    Hemoglobin 8.8 (*) 12.0 - 15.0 (g/dL)    HCT 40.9 (*) 81.1 - 46.0 (%)    MCV 92.4  78.0 - 100.0 (fL)    MCH 30.4  26.0 - 34.0 (pg)    MCHC 33.0  30.0 - 36.0 (g/dL)    RDW 91.4 (*) 78.2 - 15.5 (%)    Platelets 200  150 - 400 (K/uL)   BASIC METABOLIC PANEL     Status: Abnormal   Collection Time   09/14/11 11:35 AM      Component Value Range Comment   Sodium 131 (*) 135 - 145 (mEq/L)    Potassium 3.9  3.5 - 5.1 (mEq/L)    Chloride 89 (*) 96 - 112 (mEq/L)    CO2 23  19 - 32 (mEq/L)    Glucose, Bld 107 (*) 70 - 99 (mg/dL)    BUN 66 (*) 6 - 23 (mg/dL)    Creatinine, Ser 9.56 (*) 0.50 - 1.10 (mg/dL)    Calcium 9.9  8.4 - 10.5 (mg/dL)    GFR calc non Af Amer 4 (*) >90 (mL/min)    GFR calc Af Amer 5 (*) >90 (mL/min)   PROTIME-INR     Status: Abnormal   Collection Time   09/14/11 11:35 AM      Component Value Range Comment   Prothrombin Time 16.2 (*) 11.6 - 15.2 (seconds)    INR 1.27  0.00 - 1.49      Ct Abdomen Pelvis W Contrast  09/13/2011  *RADIOLOGY REPORT*  Clinical Data: Chronic pancreatitis.  Abdominal pain.  CT ABDOMEN AND PELVIS WITH CONTRAST  Technique:  Multidetector CT imaging of the abdomen and pelvis was performed following the standard protocol during bolus administration of intravenous contrast.  Contrast: OMNIPAQUE IOHEXOL 300 MG/ML IJ SOLN  Comparison: CT scan 02/02/2011.  Findings: The heart is enlarged but stable.  There are small bilateral  pleural effusions, right greater than left with overlying small infiltrates.  The liver is unremarkable.  No focal lesions or intrahepatic biliary dilatation.  The gallbladder surgically absent.  No common bile duct dilatation.  The spleen is normal in size.  No focal lesions.  The adrenal glands and kidneys are stable.  There are bilateral renal cysts.  On the prior CT scan there is a  small cystic area near the pancreatic body which was present be a pancreatic pseudocyst.  This has enlarged significantly since the prior study demonstrates high attenuation.  It is most likely a pancreatic pseudocyst with subsequent hemorrhage or less likely infection.  There is significant mass effect on the posterior wall of the stomach.  This measures a total of 6.3 x 3.5 cm.  No CT findings for acute pancreatitis.   Cystic changes noted in the pancreatic tail.  The stomach, duodenum, the small bowel and colon are unremarkable. The appendix is normal.  A peritoneal dialysis catheter is noted in the pelvis with a small amount of free abdominal and pelvic fluid. The bladder is normal.  The uterus is surgically absent.  No pelvic mass or adenopathy.  The bony structures are intact.  IMPRESSION:  1.  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  MRI of the abdomen without and with contrast may be helpful for further evaluation. 2.  Cystic changes in the pancreatic tail. 3.  Small bilateral pleural effusions with small overlying infiltrate. 4.  Stable cardiac enlargement. 5.  Peritoneal dialysis catheter in the pelvis with moderate free abdominal and pelvic fluid.  Original Report Authenticated By: P. Loralie Champagne, M.D.   Dg Abd Acute W/chest  09/13/2011  *RADIOLOGY REPORT*  Clinical Data: Nausea and vomiting.  ACUTE ABDOMEN SERIES (ABDOMEN 2 VIEW & CHEST 1 VIEW)  Comparison: CT scan 02/02/2011 and chest x-ray 06/02/2011.  Findings: The upright chest x-ray demonstrates pacer wires / AICD in good position without complicating features.  There are stable surgical changes from triple bypass surgery.  The heart is enlarged but unchanged.  Mild vascular congestion but no pulmonary edema or pleural effusion.  Two views of the abdomen demonstrate scattered air and stool throughout the colon and down to the rectum.   No distended small bowel loops to suggest obstruction.  No free air.  A peritoneal dialysis catheter is noted in the pelvis.  Stable surgical changes. The bony structures are intact.  IMPRESSION:  1.  No acute cardiopulmonary findings.  Stable cardiac enlargement. 2.  No plain film findings for an acute abdominal process.  Original Report Authenticated By: P. Loralie Champagne, M.D.    ROS: noncontributory Blood pressure 140/93, pulse 83, temperature 97.9 F (36.6 C), temperature source Oral, resp. rate 18, height 5\' 4"  (1.626 m), weight 59.149 kg (130 lb 6.4 oz), SpO2 100.00%. General appearance: alert, cooperative and appears stated age Head: Normocephalic, without obvious abnormality, atraumatic Nose: Nares normal. Septum midline. Mucosa normal. No drainage or sinus tenderness. Throat: lips, mucosa, and tongue normal; teeth and gums normal Resp: clear to auscultation bilaterally Chest wall: no tenderness Cardio: regular rate and rhythm, S1, S2 normal, no murmur, click, rub or gallop GI: diffuse tenderness, soft Extremities: extremities normal, atraumatic, no cyanosis or edema Skin: Skin color, texture, turgor normal. No rashes or lesions Neurologic: Grossly normal  Assessment/Plan:  1 ESRD 2 Chronic Pancreatitis 3. GI bleed & ABLA  She might do best with hemodialysis  just to remove the question of any contribution of PD cath to abd pain.  Will defer that to his primary nephrologist.  Orders written.  Akiva Brassfield C 09/14/2011, 1:56 PM

## 2011-09-14 NOTE — Consult Note (Signed)
[name]  1950-07-15  [id number].  Primary Care MD: [facility]  Requesting MD: Dr. Jennette Kettle  Chief Complaint/Reason for Consult: Pancreatic pseudocyst-abdominal pain  HPI: 61 yo female with history of chronic pancreatitis (initially secondary to gallstones and developed necrotizing pancreatitis, now with chronic pancreatitis and pseudocyst), ESRD on PD, CAD who presents with acute onset hematemesis and abdominal pain. Was seen at [facility] hospital 3 weeks ago for similar presentation, records are pending but patient endorses knowledge of pseudocysts previously. The patient did not have hematemesis at that time.  She had an extensive workup related to chronic nausea and vomiting, thought to be secondary to this chronic pseudocyst.  She had an EGD, which reportedly only showed a hiatal hernia, along with other tests.  The patient does not remember all of the tests she had.  She ultimately left on the 18th doing much better.    She has had no issues until this past Saturday night.  She developed nausea and vomiting.  After 3-4 episodes, she describes throwing up dark specks, like coffee-ground emesis.  She did not have any hematochezia at home.  Given that she has chronic anemia, she presented to the [facility] for work up because she was concerned about her blood levels.  Upon arrival she was noted to have a hgb of 6.5 which was down from around 8.8 on Friday when she got her aranesp and Fe at [facility] for her PD.  She admits to some abdominal pain, but no different than her normal abdominal pain.  After admission, she had a CT scan that revealed this 6.3x3.5cm hemorrhagic pseudocyst.  The patient began having some maroon stools from her rectum today.  We have been asked to see the patient regarding her pseudocyst as a cause for her symptoms.  Review of Systems: Please see HPI, otherwise all other systems have been reviewed and are negative.  She does state that she has been offered a cystgastrectomy  in the past, but refused as she may not be able to continue with PD for her ESRD and may have to switch to HD, which she is adamantly against currently.    Family History   Problem  Relation  Age of Onset   .  Coronary artery disease  Mother    .  Hypertension  Mother    .  Diabetes  Mother    .  Diabetes  Sister     Past Medical History   Diagnosis  Date   .  Gallstone pancreatitis    .  Coronary artery disease      s/p CABG 2008 with multiple PCIs   .  Ischemic cardiomyopathy    .  End stage renal disease    .  Diabetes mellitus    .  Hypertension    .  History of nonadherence to medical treatment    .  LBBB (left bundle branch block)    .  Hyperlipidemia    .  Renal insufficiency    .  CHF (congestive heart failure)    .  Dialysis patient    .  Anemia     Past Surgical History   Procedure  Date   .  Coronary artery bypass graft  2008   .  Cholecystectomy, open Tubal ligation, thinks her appendix was removed at this time as well Vaginal hysterectomy (FT and ovaries still intact)     Social History: reports that she quit smoking about 11 years ago. She does  not have any smokeless tobacco history on file. She reports that she does not drink alcohol or use illicit drugs.  Allergies:  Allergies   Allergen  Reactions   .  Morphine And Related  Anaphylaxis   .  Codeine  Itching   .  Lisinopril      Language changed    Medications Prior to Admission   Medication  Dose  Route  Frequency  Provider  Last Rate  Last Dose   .  0.9 % sodium chloride infusion   Intravenous  Continuous  Elsie Saas Park, MD     .  carvedilol (COREG) tablet 6.25 mg  6.25 mg  Oral  BID WC  Elsie Saas Park, MD     .  doxazosin (CARDURA) tablet 2 mg  2 mg  Oral  QHS  Elsie Saas [place], MD     .  HYDROmorphone (DILAUDID) injection 0.5 mg  0.5 mg  Intravenous  Q4H PRN  Elsie Saas [place], MD   0.5 mg at 09/14/11 0847   .  HYDROmorphone (DILAUDID) injection 1 mg  1 mg  Intravenous  Once  Cheri Guppy,  MD   1 mg at 09/13/11 1604   .  insulin aspart (novoLOG) injection 0-15 Units  0-15 Units  Subcutaneous  TID WC  Elsie Saas Park, MD     .  iohexol (OMNIPAQUE) 300 MG/ML solution 100 mL  100 mL  Intravenous  Once PRN  Medication Radiologist, MD   100 mL at 09/13/11 2230   .  ondansetron (ZOFRAN) injection 4 mg  4 mg  Intravenous  Once  Cheri Guppy, MD   4 mg at 09/13/11 1604   .  ondansetron (ZOFRAN) injection 4 mg  4 mg  Intravenous  Once  Cheri Guppy, MD   4 mg at 09/13/11 1841   .  ondansetron (ZOFRAN) injection 4 mg  4 mg  Intravenous  Q6H PRN  Shelly Rubenstein, MD   4 mg at 09/13/11 2204   .  pantoprazole (PROTONIX) EC tablet 40 mg  40 mg  Oral  Q1200  Elsie Saas Park, MD     .  pantoprazole (PROTONIX) injection 40 mg  40 mg  Intravenous  Once  Cheri Guppy, MD   40 mg at 09/13/11 1604   .  potassium chloride 10 mEq in 100 mL IVPB  10 mEq  Intravenous  Q1 Hr x 4  Elsie Saas Park, MD     .  ranolazine [facility]) 12 hr tablet 500 mg  500 mg  Oral  Daily  Elsie Saas Park, MD     .  sevelamer (RENAGEL) tablet 3,200 mg  3,200 mg  Oral  TID WC  Elsie Saas Park, MD     .  sodium chloride 0.9 % injection 3 mL  3 mL  Intravenous  Q12H  Elsie Saas Park, MD   3 mL at 09/14/11 1012   .  DISCONTD: 0.9 % sodium chloride infusion   Intravenous  Continuous  Shelly Rubenstein, MD  20 mL/hr at 09/13/11 1836  20 mL/hr at 09/13/11 1836   .  DISCONTD: HYDROmorphone (DILAUDID) injection 1 mg  1 mg  Intramuscular  Once  Cheri Guppy, MD     .  DISCONTD: ondansetron (ZOFRAN-ODT) disintegrating tablet 4 mg  4 mg  Oral  Once  Cheri Guppy, MD     .  DISCONTD: pantoprazole (  PROTONIX) EC tablet 40 mg  40 mg  Oral  Once  Cheri Guppy, MD     .  DISCONTD: pantoprazole (PROTONIX) injection 40 mg  40 mg  Intravenous  Q24H  Shelly Rubenstein, MD   40 mg at 09/13/11 2209   .  DISCONTD: potassium chloride SA (K-DUR,KLOR-CON) CR tablet 40 mEq  40 mEq  Oral  BID  Shelly Rubenstein, MD       Medications Prior to Admission   Medication  Sig  Dispense  Refill   .  aspirin EC 81 MG tablet  Take 81 mg by mouth daily.     .  carvedilol (COREG) 25 MG tablet  Take 25 mg by mouth 2 (two) times daily with a meal.     .  doxazosin (CARDURA) 4 MG tablet  Take 4 mg by mouth at bedtime.     .  insulin glargine (LANTUS) 100 UNIT/ML injection  Inject 10 Units into the skin daily as needed. Per sliding scale     .  isosorbide mononitrate (IMDUR) 30 MG 24 hr tablet  Take 30 mg by mouth daily.     .  Multiple Vitamin (DAILY VITAMIN PO)  Take by mouth daily.     .  pravastatin (PRAVACHOL) 20 MG tablet  Take 20 mg by mouth daily.     .  ranolazine (RANEXA) 500 MG 12 hr tablet  Take 500 mg by mouth daily.     .  sevelamer (RENAGEL) 800 MG tablet  Take 3,200 mg by mouth 3 (three) times daily with meals.      Blood pressure 139/90, pulse 82, temperature 97.7 F (36.5 C), temperature source Oral, resp. rate 20, height 5\' 4"  (1.626 m), weight 130 lb 6.4 oz (59.149 kg), SpO2 100.00%.  Physical Exam: General: pleasant, WD, WN black female who is laying in bed in NAD HEENT: head is normocephalic, atraumatic.  Sclera are noninjected.  PERRL.  Ears and nose without any masses or lesions.  Mouth is pink and moist Heart: regular, rate, and rhythm.  Normal s1,s2. No obvious murmurs, gallops, or rubs noted.  Palpable radial and pedal pulses bilaterally Lungs: CTAB, no wheezes, rhonchi, or rales noted.  Respiratory effort nonlabored Abd: soft, mild diffuse tenderness, greatest in no one specific spot, ND, +BS, no masses, hernias, or organomegaly.  She does have several scars noted from her prior abdominal operations.  PD catheter in place in right side of abdomen. MS: all 4 extremities are symmetrical with no cyanosis, clubbing, or edema. Skin: warm and dry with no masses, lesions, or rashes Psych: A&Ox3 with an appropriate affect.      Results for orders placed during the hospital encounter of 09/13/11  (from the past 48 hour(s))   CBC Status: Abnormal    Collection Time    09/13/11 4:12 PM   Component  Value  Range  Comment    WBC  9.0  4.0 - 10.5 (K/uL)     RBC  2.10 (*)  3.87 - 5.11 (MIL/uL)     Hemoglobin  6.5 (*)  12.0 - 15.0 (g/dL)     HCT  40.9 (*)  81.1 - 46.0 (%)     MCV  96.2  78.0 - 100.0 (fL)     MCH  31.0  26.0 - 34.0 (pg)     MCHC  32.2  30.0 - 36.0 (g/dL)     RDW  91.4 (*)  78.2 - 15.5 (%)  Platelets  223  150 - 400 (K/uL)    DIFFERENTIAL Status: Abnormal    Collection Time    09/13/11 4:12 PM   Component  Value  Range  Comment    Neutrophils Relative  87 (*)  43 - 77 (%)     Neutro Abs  7.8 (*)  1.7 - 7.7 (K/uL)     Lymphocytes Relative  10 (*)  12 - 46 (%)     Lymphs Abs  0.9  0.7 - 4.0 (K/uL)     Monocytes Relative  3  3 - 12 (%)     Monocytes Absolute  0.3  0.1 - 1.0 (K/uL)     Eosinophils Relative  0  0 - 5 (%)     Eosinophils Absolute  0.0  0.0 - 0.7 (K/uL)     Basophils Relative  0  0 - 1 (%)     Basophils Absolute  0.0  0.0 - 0.1 (K/uL)    COMPREHENSIVE METABOLIC PANEL Status: Abnormal    Collection Time    09/13/11 4:12 PM   Component  Value  Range  Comment    Sodium  137  135 - 145 (mEq/L)     Potassium  3.5  3.5 - 5.1 (mEq/L)     Chloride  96  96 - 112 (mEq/L)     CO2  27  19 - 32 (mEq/L)     Glucose, Bld  155 (*)  70 - 99 (mg/dL)     BUN  51 (*)  6 - 23 (mg/dL)     Creatinine, Ser  1.61 (*)  0.50 - 1.10 (mg/dL)     Calcium  9.8  8.4 - 10.5 (mg/dL)     Total Protein  5.4 (*)  6.0 - 8.3 (g/dL)     Albumin  2.4 (*)  3.5 - 5.2 (g/dL)     AST  13  0 - 37 (U/L)     ALT  12  0 - 35 (U/L)     Alkaline Phosphatase  59  39 - 117 (U/L)     Total Bilirubin  0.2 (*)  0.3 - 1.2 (mg/dL)     GFR calc non Af Amer  4 (*)  >90 (mL/min)     GFR calc Af Amer  5 (*)  >90 (mL/min)    LIPASE, BLOOD Status: Abnormal    Collection Time    09/13/11 4:12 PM   Component  Value  Range  Comment    Lipase  147 (*)  11 - 59 (U/L)    PREPARE RBC (CROSSMATCH) Status:  Normal    Collection Time    09/13/11 5:14 PM   Component  Value  Range  Comment    Order Confirmation  ORDER PROCESSED BY BLOOD BANK     TYPE AND SCREEN Status: Normal (Preliminary result)    Collection Time    09/13/11 5:14 PM   Component  Value  Range  Comment    ABO/RH(D)  O POS      Antibody Screen  POS      Sample Expiration  09/16/2011      Antibody Identification  ANTI-K      DAT, IgG  NEG      PT AG Type  NEGATIVE FOR KELL ANTIGEN      Unit Number  [id number]      Blood Component Type  RED CELLS,LR      Unit division  00  Status of Unit  ISSUED      Donor AG Type  NEGATIVE FOR KELL ANTIGEN      Transfusion Status  OK TO TRANSFUSE      Crossmatch Result  COMPATIBLE      Unit Number  [id number]      Blood Component Type  RED CELLS,LR      Unit division  00      Status of Unit  ISSUED      Donor AG Type  NEGATIVE FOR KELL ANTIGEN      Transfusion Status  OK TO TRANSFUSE      Crossmatch Result  COMPATIBLE     TYPE AND SCREEN Status: Normal    Collection Time    09/13/11 7:11 PM   Component  Value  Range  Comment    ABO/RH(D)  O POS      Antibody Screen  POS      Sample Expiration  09/16/2011     IRON AND TIBC Status: Normal    Collection Time    09/13/11 7:44 PM   Component  Value  Range  Comment    Iron  96  42 - 135 (ug/dL)     TIBC  469  629 - 528 (ug/dL)     Saturation Ratios  38  20 - 55 (%)     UIBC  157  125 - 400 (ug/dL)    FERRITIN Status: Abnormal    Collection Time    09/13/11 7:44 PM   Component  Value  Range  Comment    Ferritin  821 (*)  10 - 291 (ng/mL)    PREALBUMIN Status: Normal    Collection Time    09/13/11 7:44 PM   Component  Value  Range  Comment    Prealbumin  22.0  17.0 - 34.0 (mg/dL)    RETICULOCYTES Status: Abnormal    Collection Time    09/13/11 8:12 PM   Component  Value  Range  Comment    Retic Ct Pct  8.4 (*)  0.4 - 3.1 (%)     RBC.  2.07 (*)  3.87 - 5.11 (MIL/uL)     Retic Count, Manual  173.9  19.0 - 186.0 (K/uL)     GLUCOSE, CAPILLARY Status: Abnormal    Collection Time    09/14/11 7:44 AM   Component  Value  Range  Comment    Glucose-Capillary  171 (*)  70 - 99 (mg/dL)    MRSA PCR SCREENING Status: Normal    Collection Time    09/14/11 8:49 AM   Component  Value  Range  Comment    MRSA by PCR  NEGATIVE  NEGATIVE     Ct Abdomen Pelvis W Contrast  09/13/2011 *RADIOLOGY REPORT* Clinical Data: Chronic pancreatitis. Abdominal pain. CT ABDOMEN AND PELVIS WITH CONTRAST Technique: Multidetector CT imaging of the abdomen and pelvis was performed following the standard protocol during bolus administration of intravenous contrast. Contrast: OMNIPAQUE IOHEXOL 300 MG/ML IJ SOLN Comparison: CT scan 02/02/2011. Findings: The heart is enlarged but stable. There are small bilateral pleural effusions, right greater than left with overlying small infiltrates. The liver is unremarkable. No focal lesions or intrahepatic biliary dilatation. The gallbladder surgically absent. No common bile duct dilatation. The spleen is normal in size. No focal lesions. The adrenal glands and kidneys are stable. There are bilateral renal cysts. On the prior CT scan there is a small cystic area near the pancreatic body which was present be  a pancreatic pseudocyst. This has enlarged significantly since the prior study demonstrates high attenuation. It is most likely a pancreatic pseudocyst with subsequent hemorrhage or less likely infection. There is significant mass effect on the posterior wall of the stomach. This measures a total of 6.3 x 3.5 cm. No CT findings for acute pancreatitis. Cystic changes noted in the pancreatic tail. The stomach, duodenum, the small bowel and colon are unremarkable. The appendix is normal. A peritoneal dialysis catheter is noted in the pelvis with a small amount of free abdominal and pelvic fluid. The bladder is normal. The uterus is surgically absent. No pelvic mass or adenopathy. The bony structures are intact.  IMPRESSION: 1. 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 MRI of the abdomen without and with contrast may be helpful for further evaluation. 2. Cystic changes in the pancreatic tail. 3. Small bilateral pleural effusions with small overlying infiltrate. 4. Stable cardiac enlargement. 5. Peritoneal dialysis catheter in the pelvis with moderate free abdominal and pelvic fluid. Original Report Authenticated By: P. Loralie Champagne, M.D.  Dg Abd Acute W/chest  09/13/2011 *RADIOLOGY REPORT* Clinical Data: Nausea and vomiting. ACUTE ABDOMEN SERIES (ABDOMEN 2 VIEW & CHEST 1 VIEW) Comparison: CT scan 02/02/2011 and chest x-ray 06/02/2011. Findings: The upright chest x-ray demonstrates pacer wires / AICD in good position without complicating features. There are stable surgical changes from triple bypass surgery. The heart is enlarged but unchanged. Mild vascular congestion but no pulmonary edema or pleural effusion. Two views of the abdomen demonstrate scattered air and stool throughout the colon and down to the rectum. No distended small bowel loops to suggest obstruction. No free air. A peritoneal dialysis catheter is noted in the pelvis. Stable surgical changes. The bony structures are intact. IMPRESSION: 1. No acute cardiopulmonary findings. Stable cardiac enlargement. 2. No plain film findings for an acute abdominal process. Original Report Authenticated By: P. Loralie Champagne, M.D.   Assessment/Plan  1. UGI bleed 2. Chronic pseudocyst, bigger likely secondary to hemorrhage  3. Chronic pancreatitis 4. Chronic nausea/vomiting secondary to #3. 5. ESRD, PD 6. CAD  Plan: 1. Based off of the events the patient has told me, it sounds like she started out with her normal nausea and vomiting.  After several episodes, developed hematemesis, and lost 2 grams of blood between Friday and yesterday.  It is more likely  the patient developed a GI bleed secondary to emesis and had some hemorrhage into her pseudocyst.  The pseudocyst is not a likely source or cause for her bleeding.  The blood per rectum may very well be some left blood from the UGI source, but if she continues to have blood per rectum and her hgb trends down, she will likely need a GI consult for possible EGD to determine source of oozing or bleeding.  We will continue to follow patient along with you.  Thank you for this consult.  This is a complex patient, who alternates care between here and the [facility].  She was just in the [facility] and worked up for similar problems along similar lines.  The pseudocyst is bigger, but not the source of her blood loss. Will follow with you, but think she is best managed at the [facility].  DN.  OSBORNE,KELLY E 2:58 PM 09/14/2011  Ovidio Kin, MD, [facility] Pager: [phone] Office phone:  [phone]

## 2011-09-14 NOTE — Progress Notes (Signed)
INITIAL ADULT NUTRITION ASSESSMENT Date: 09/14/2011   Time: 11:11 AM  Reason for Assessment: Nutrition Risk Report (Unintentional Wt Loss)  ASSESSMENT: [name]  Dx: Pancreatic pseudocyst  Hx:  Past Medical History  Diagnosis Date  . Gallstone pancreatitis   . Coronary artery disease     s/p CABG 2008 with multiple PCIs  . Ischemic cardiomyopathy   . End stage renal disease   . Diabetes mellitus   . Hypertension   . History of nonadherence to medical treatment   . LBBB (left bundle branch block)   . Hyperlipidemia   . Renal insufficiency   . CHF (congestive heart failure)   . Dialysis patient   . Anemia    Related Meds:     . carvedilol  6.25 mg Oral BID WC  . doxazosin  2 mg Oral QHS  .  HYDROmorphone (DILAUDID) injection  1 mg Intravenous Once  . insulin aspart  0-15 Units Subcutaneous TID WC  . ondansetron (ZOFRAN) IV  4 mg Intravenous Once  . ondansetron (ZOFRAN) IV  4 mg Intravenous Once  . pantoprazole  40 mg Oral Q1200  . pantoprazole (PROTONIX) IV  40 mg Intravenous Once  . potassium chloride  10 mEq Intravenous Q1 Hr x 4  . ranolazine  500 mg Oral Daily  . sevelamer  3,200 mg Oral TID WC  . sodium chloride  3 mL Intravenous Q12H  . DISCONTD:  HYDROmorphone (DILAUDID) injection  1 mg Intramuscular Once  . DISCONTD: ondansetron  4 mg Oral Once  . DISCONTD: pantoprazole  40 mg Oral Once  . DISCONTD: pantoprazole (PROTONIX) IV  40 mg Intravenous Q24H  . DISCONTD: potassium chloride  40 mEq Oral BID   Ht: 5\' 4"  (162.6 cm)  Wt: 130 lb 6.4 oz (59.149 kg)  Ideal Wt: 54.7 kg  % Ideal Wt: 108%  Wt Readings from Last 10 Encounters:  09/13/11 130 lb 6.4 oz (59.149 kg)  06/04/11 157 lb 13.6 oz (71.6 kg)  04/08/11 159 lb 6.4 oz (72.303 kg)  12/01/10 176 lb (79.833 kg)  Usual Wt: 176 lb (79.8 kg) % Usual Wt: 73%  Body mass index is 22.38 kg/(m^2). Weight is WNL.  Food/Nutrition Related Hx: pt reports declining appetite x months  Labs:  CMP       Component Value Date/Time   NA 137 09/13/2011 1612   K 3.5 09/13/2011 1612   CL 96 09/13/2011 1612   CO2 27 09/13/2011 1612   GLUCOSE 155* 09/13/2011 1612   BUN 51* 09/13/2011 1612   CREATININE 8.96* 09/13/2011 1612   CALCIUM 9.8 09/13/2011 1612   PROT 5.4* 09/13/2011 1612   ALBUMIN 2.4* 09/13/2011 1612   AST 13 09/13/2011 1612   ALT 12 09/13/2011 1612   ALKPHOS 59 09/13/2011 1612   BILITOT 0.2* 09/13/2011 1612   GFRNONAA 4* 09/13/2011 1612   GFRAA 5* 09/13/2011 1612  No phosphorus available.  Intake/Output: I/O last 3 completed shifts: In: 1389.2 [I.V.:1000; Blood:389.2] Out: -     Diet Order: NPO  Supplements/Tube Feeding: none  IVF:    sodium chloride   DISCONTD: sodium chloride Last Rate: 20 mL/hr (09/13/11 1836)   Estimated Nutritional Needs:   Kcal:  1770 - 2050 Protein: 75 - 85 grams protein Fluid:  1.2 L/d  RD drawn to chart 2/2 Nutrition Risk Report. Pt states that her weight has been declining since the beginning of 2012. Noted pt on PD x 1 year.  Pt states her weight loss has  been more dramatic recently. Currently only eating 1 egg, 1 Ensure, and fruit daily. Issues with vomiting throughout the time frame of 1 year. Pt meets criteria for severe malnutrition in the context of acute illness 2/2 27% wt loss in 9 months, and <75% of estimated energy requirement x at least 1 month. Pt reports that she regularly sees her PD RD.  NUTRITION DIAGNOSIS: -Inadequate oral intake (NI-2.1).  Status: Ongoing  RELATED TO: GI distress and poor appetite  AS EVIDENCE BY: 27% wt loss in 9 months, and <75% of estimated energy requirement x at least 1 month.  MONITORING/EVALUATION(Goals): Goal: Pt to advance to diet as tolerated. Intake to meet >90% of estimated needs. Monitor: PO intake, weights, labs, I/O's  EDUCATION NEEDS: -No education needs identified at this time  INTERVENTION: 1. Recommend monitoring magnesium, phosphorus, and potassium 2/2 severe malnutrition dx and  prolonged suboptimal PO intake; pt at risk for refeeding syndrome. 2. RD to supplement Resource Breeze PO BID once advanced to diet. 3. RD to continue to follow  Dietitian #: [phone]  DOCUMENTATION CODES Per approved criteria  -Severe malnutrition in the context of chronic illness    Adair Laundry 09/14/2011, 11:11 AM

## 2011-09-14 NOTE — Progress Notes (Signed)
Spoke with [facility] rep Child psychotherapist, Public librarian. Pt is eligible for transfer to [facility] [place] if desired.  Will continue to follow for d/c needs.  Johny Shock RN MPH Case Manager [phone]

## 2011-09-14 NOTE — Progress Notes (Signed)
Admitted  To  Room  6742  Via  Stretcher    [place]  X3/4          Pt  Placed  On  Tele  42      Oriented  To  Room  And   Devices     Placed  On   Red  Socks     And    Instructed  To  Call  For   Assistance    Rationale  Given    Also   For  Low  hgb  And   The  Need   To  Notify  Staff  Of  Any  Bloody  stools

## 2011-09-14 NOTE — Progress Notes (Signed)
Report were call to [phone], pt. Ready to be transfer.

## 2011-09-14 NOTE — Progress Notes (Signed)
Received  1 unit prbc   tol  Well   .then patient  To  Restroom   And  Had  Large  Bloody  Stool.

## 2011-09-14 NOTE — Progress Notes (Signed)
PGY-1 Daily Progress Note Family Medicine Teaching Service Ariel Dimitri M. Teagon Kron, MD Service Pager: [phone]  Subjective: Patient states she feels somewhat better after 1U PRBC and pain medication. She is now having lower GI bleed. Denies CP, SOB or edema. Has been OOB to bedside commode. Still receiving 2U PRBC.  Objective: Vital signs in last 24 hours: Temp:  [97.4 F (36.3 C)-98.5 F (36.9 C)] 97.8 F (36.6 C) (04/01 0830) Pulse Rate:  [76-100] 82  (04/01 0830) Resp:  [12-20] 18  (04/01 0830) BP: (99-142)/(63-96) 142/96 mmHg (04/01 0830) SpO2:  [95 %-100 %] 100 % (04/01 0720) Weight:  [130 lb 6.4 oz (59.149 kg)-141 lb (63.957 kg)] 130 lb 6.4 oz (59.149 kg) (03/31 2253) Weight change:  Last BM Date: 09/13/11  Intake/Output from previous day: 03/31 0701 - 04/01 0700 In: 1389.2 [I.V.:1000; Blood:389.2] Out: -  Intake/Output this shift:   General appearance: alert and appears sickly lying in bed. Talkative and knowledgable about her current condition. Head: Normocephalic, without obvious abnormality, atraumatic Resp: clear to auscultation bilaterally and No wheezes or crackles at 30 degrees Cardio: regular rate and rhythm GI: Not fully examined secondary to pain Extremities: extremities normal, atraumatic, no cyanosis or edema Neurologic: Grossly normal  Lab Results:  Basename 09/13/11 1612  WBC 9.0  HGB 6.5*  HCT 20.2*  PLT 223   BMET  Basename 09/13/11 1612  NA 137  K 3.5  CL 96  CO2 27  GLUCOSE 155*  BUN 51*  CREATININE 8.96*  CALCIUM 9.8    Studies/Results: Ct Abdomen Pelvis W Contrast  09/13/2011  *RADIOLOGY REPORT*  Clinical Data: Chronic pancreatitis.  Abdominal pain.  CT ABDOMEN AND PELVIS WITH CONTRAST  Technique:  Multidetector CT imaging of the abdomen and pelvis was performed following the standard protocol during bolus administration of intravenous contrast.  Contrast: OMNIPAQUE IOHEXOL 300 MG/ML IJ SOLN  Comparison: CT scan 02/02/2011.   Findings: The heart is enlarged but stable.  There are small bilateral pleural effusions, right greater than left with overlying small infiltrates.  The liver is unremarkable.  No focal lesions or intrahepatic biliary dilatation.  The gallbladder surgically absent.  No common bile duct dilatation.  The spleen is normal in size.  No focal lesions.  The adrenal glands and kidneys are stable.  There are bilateral renal cysts.  On the prior CT scan there is a small cystic area near the pancreatic body which was present be a pancreatic pseudocyst.  This has enlarged significantly since the prior study demonstrates high attenuation.  It is most likely a pancreatic pseudocyst with subsequent hemorrhage or less likely infection.  There is significant mass effect on the posterior wall of the stomach.  This measures a total of 6.3 x 3.5 cm.  No CT findings for acute pancreatitis.   Cystic changes noted in the pancreatic tail.  The stomach, duodenum, the small bowel and colon are unremarkable. The appendix is normal.  A peritoneal dialysis catheter is noted in the pelvis with a small amount of free abdominal and pelvic fluid. The bladder is normal.  The uterus is surgically absent.  No pelvic mass or adenopathy.  The bony structures are intact.  IMPRESSION:  1.  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  MRI of the abdomen without and with contrast may be helpful for further evaluation. 2.  Cystic changes in the pancreatic tail. 3.  Small bilateral pleural effusions  with small overlying infiltrate. 4.  Stable cardiac enlargement. 5.  Peritoneal dialysis catheter in the pelvis with moderate free abdominal and pelvic fluid.  Original Report Authenticated By: P. Loralie Champagne, M.D.   Dg Abd Acute W/chest  09/13/2011  *RADIOLOGY REPORT*  Clinical Data: Nausea and vomiting.  ACUTE ABDOMEN SERIES (ABDOMEN 2 VIEW & CHEST 1  VIEW)  Comparison: CT scan 02/02/2011 and chest x-ray 06/02/2011.  Findings: The upright chest x-ray demonstrates pacer wires / AICD in good position without complicating features.  There are stable surgical changes from triple bypass surgery.  The heart is enlarged but unchanged.  Mild vascular congestion but no pulmonary edema or pleural effusion.  Two views of the abdomen demonstrate scattered air and stool throughout the colon and down to the rectum.  No distended small bowel loops to suggest obstruction.  No free air.  A peritoneal dialysis catheter is noted in the pelvis.  Stable surgical changes. The bony structures are intact.  IMPRESSION:  1.  No acute cardiopulmonary findings.  Stable cardiac enlargement. 2.  No plain film findings for an acute abdominal process.  Original Report Authenticated By: P. Loralie Champagne, M.D.   Medications:  I have reviewed the patient's current medications. Scheduled:   . carvedilol  6.25 mg Oral BID WC  . doxazosin  2 mg Oral QHS  .  HYDROmorphone (DILAUDID) injection  1 mg Intravenous Once  . insulin aspart  0-15 Units Subcutaneous TID WC  . ondansetron (ZOFRAN) IV  4 mg Intravenous Once  . ondansetron (ZOFRAN) IV  4 mg Intravenous Once  . pantoprazole  40 mg Oral Q1200  . pantoprazole (PROTONIX) IV  40 mg Intravenous Once  . potassium chloride  10 mEq Intravenous Q1 Hr x 4  . ranolazine  500 mg Oral Daily  . sevelamer  3,200 mg Oral TID WC  . sodium chloride  3 mL Intravenous Q12H  . DISCONTD:  HYDROmorphone (DILAUDID) injection  1 mg Intramuscular Once  . DISCONTD: ondansetron  4 mg Oral Once  . DISCONTD: pantoprazole  40 mg Oral Once  . DISCONTD: pantoprazole (PROTONIX) IV  40 mg Intravenous Q24H  . DISCONTD: potassium chloride  40 mEq Oral BID   Continuous:   . sodium chloride    . DISCONTD: sodium chloride 20 mL/hr (09/13/11 1836)   ZHY:QMVHQIONGEXBM (DILAUDID) injection, iohexol, ondansetron (ZOFRAN) IV  Assessment/Plan: This is a  61 year old African-American female with a history of chronic pancreatitis, nausea/vomiting for the past year, and ESRD on home peritoneal dialysis who presents with worsening nausea/vomiting.   1.Pancreatic pseudocyst--the patient was recently hospitalized for N/V and hematemesis at the [facility] from 03/05-03/19. CT-abd/pelvis 01/2011 (most recent one done at [facility]) showed pancreas with two peripancreatic fluid collections (likely pseudocysts) along body/tail with mild peripancreatic stranding consistent with mild pancreatitis. History of chronic pancreatitis since 2003 attributed to gallstones. Improved following cholecystectomy but has been having symptoms (abdomina pain/nausea/vomiting) consistent with previous pancreatitis for the past year. Patient told recent hospitalization she has pancreatic pseudocysts. Repeat CT scan done today shows enlarging of pseudocyst with hemorrhagic process. Lipase elevated at 147 (in the past 60mo ago, 260-270). However, patient afebrile and WBC WNL. LFTs not elevated. Patient now with lower GI bleed. - Continue Dilaudid 0.5 q4hrs prn pain.  - Surgery consult pending. - Patient brought her records from [facility] with her to the hospital. Currently trying to find those records. If not able to locate soon, we will have her sign a ROI form and  requests records with d/c summary, EGD and other procedures. - Will consider transfer to stepdown unit if patient becomes unstable.  2. Anemia--she has a longstanding history of anemia for several years. Previously, her anemia was attributed to heavy periods, however, her anemia has persisted with menopause. She receives periodic blood transfusions. The cause of her anemia is unknown. She has seen hematologists in the past. She thinks her chronic renal disease is the cause of her anemia. Her baseline hemoglobin is around 8. She receives Aranesp treatments weekly. During her recent hospitalization, she saw a GI specialist who performed  an EGD. The only significant finding per patient was a hiatal hernia. Current anemia likely 2/2 to pseudocyst and GI bleed. Patient is asymptomatic from her anemia (no tachycardia, BP stable).  - Hgb 6.5 on arrival. Currently receiving transfusion; will await posttransfusion CBC. - Hold home ASA. Will order Iron studies  - Follow-up gastroccult.  - If patient develops any type of edema from blood transfusion will consider giving Lasix  3. History of ESRD on peritoneal dialysis--secondary to poorly controlled hypertension and diabetes secondary to medical non-compliance. Started peritoneal dialysis about 1 year ago.  - Dialysis every 1-2 days. Electrolytes okay currently on admission. - Renal consult service called this morning - Patient makes urine (about 1/2 cup daily).  - Home medications: renolazine, sevelamer which are being continued at this time.   4. Cardiology--patient reports history of HTN but blood pressures not atypically 100s systolics.  - History of severe chronic systolic heart failure--05/2011 TEE: EF 5-10%. Followed by heart failure cardiologist in [facility]. She was recently hospitalized 05/2011 for acute on chronic heart failure secondary to blood transfusion. She denies any symptoms at this time during blood transfusion, but she knows her body well enough to let us know if she has any changes - History of CAD s/p CABG 2008 with multiple PCI  - History of ischemic cardiomyopathy--although patient denies history of heart attack.  - History of hyperlipidemia  - Fluid restriction. Monitor for signs of fluid overload during transfusion.  - Home medications: Coreg 25 bid, doxazosin 4 qhs, hydralazine 25 bid/Imdur, pravastatin 20>>>hold hydralazine/Imdur, pravstatin. Continue doxazosin, Coreg at reduced dose.   5. T2DM--recent HgbA1c between 5-6. She uses insulin intermittently. It had been poorly controlled in the past.  - Home medications: Lantus 10 prn. - Continue SSI and  monitor CBG  6. FEN/GI  -IVF: NS @ KVO.  -Diet: NPO.   7. PPx:  -DVT PPx: SCD (no heparin 2/2 anemia and GI bleed)  -SUP: Protonix   Dispo: pending clinical improvement. Awaiting surgery consult.   CODE: DNR/DNI   LOS: 1 day   Verle Brillhart 09/14/2011, 8:56 AM

## 2011-09-15 ENCOUNTER — Encounter (HOSPITAL_COMMUNITY)
Admission: EM | Disposition: A | Discharge status: Home / Self Care | Payer: Self-pay | Source: Home / Self Care | Attending: Family Medicine

## 2011-09-15 ENCOUNTER — Encounter (HOSPITAL_COMMUNITY): Payer: Self-pay | Admitting: *Deleted

## 2011-09-15 HISTORY — PX: ESOPHAGOGASTRODUODENOSCOPY: SHX5428

## 2011-09-15 LAB — COMPREHENSIVE METABOLIC PANEL
ALT: 11 U/L (ref 0–35)
Alkaline Phosphatase: 55 U/L (ref 39–117)
CO2: 28 mEq/L (ref 19–32)
Chloride: 92 mEq/L — ABNORMAL LOW (ref 96–112)
GFR calc Af Amer: 5 mL/min — ABNORMAL LOW (ref >90)
GFR calc non Af Amer: 4 mL/min — ABNORMAL LOW (ref >90)
Glucose, Bld: 123 mg/dL — ABNORMAL HIGH (ref 70–99)
Potassium: 3.8 mEq/L (ref 3.5–5.1)
Sodium: 136 mEq/L (ref 135–145)
Total Bilirubin: 0.3 mg/dL (ref 0.3–1.2)
Total Protein: 5.7 g/dL — ABNORMAL LOW (ref 6.0–8.3)

## 2011-09-15 LAB — TYPE AND SCREEN
ABO/RH(D): O POS
Antibody Screen: POSITIVE
DAT, IgG: NEGATIVE
Donor AG Type: NEGATIVE
Unit division: 0

## 2011-09-15 LAB — GLUCOSE, CAPILLARY
Glucose-Capillary: 130 mg/dL — ABNORMAL HIGH (ref 70–99)
Glucose-Capillary: 124 mg/dL — ABNORMAL HIGH (ref 70–99)
Glucose-Capillary: 68 mg/dL — ABNORMAL LOW (ref 70–99)
Glucose-Capillary: 79 mg/dL (ref 70–99)

## 2011-09-15 LAB — CBC
MCH: 31 pg (ref 26.0–34.0)
MCHC: 33 g/dL (ref 30.0–36.0)
Platelets: 169 10*3/uL (ref 150–400)
RDW: 20.7 % — ABNORMAL HIGH (ref 11.5–15.5)

## 2011-09-15 SURGERY — EGD (ESOPHAGOGASTRODUODENOSCOPY)
Anesthesia: Moderate Sedation

## 2011-09-15 MED ORDER — LIDOCAINE VISCOUS 2 % MT SOLN
OROMUCOSAL | Status: DC | PRN
  Administered 2011-09-15: 10 mL via OROMUCOSAL

## 2011-09-15 MED ORDER — LIDOCAINE VISCOUS 2 % MT SOLN
OROMUCOSAL | Status: AC
  Filled 2011-09-15: qty 15

## 2011-09-15 MED ORDER — MIDAZOLAM HCL 10 MG/2ML IJ SOLN
INTRAMUSCULAR | Status: AC
  Filled 2011-09-15: qty 2

## 2011-09-15 MED ORDER — MIDAZOLAM HCL 10 MG/2ML IJ SOLN
INTRAMUSCULAR | Status: DC | PRN
  Administered 2011-09-15 (×4): 2 mg via INTRAVENOUS

## 2011-09-15 MED ORDER — LIDOCAINE HCL 2 % EX GEL
CUTANEOUS | Status: AC
  Filled 2011-09-15: qty 5

## 2011-09-15 MED ORDER — FENTANYL NICU IV SYRINGE 50 MCG/ML
INJECTION | INTRAMUSCULAR | Status: DC | PRN
  Administered 2011-09-15 (×3): 25 ug via INTRAVENOUS

## 2011-09-15 MED ORDER — PANTOPRAZOLE SODIUM 40 MG PO TBEC
40.0000 mg | DELAYED_RELEASE_TABLET | Freq: Two times a day (BID) | ORAL | Status: DC
  Administered 2011-09-15 – 2011-09-20 (×10): 40 mg via ORAL
  Filled 2011-09-15 (×8): qty 1

## 2011-09-15 MED ORDER — ONDANSETRON HCL 4 MG/2ML IJ SOLN
INTRAMUSCULAR | Status: AC
  Filled 2011-09-15: qty 2

## 2011-09-15 MED ORDER — DEXTROSE 50 % IV SOLN
INTRAVENOUS | Status: AC
  Administered 2011-09-15: 25 mL
  Filled 2011-09-15: qty 50

## 2011-09-15 MED ORDER — FENTANYL CITRATE 0.05 MG/ML IJ SOLN
INTRAMUSCULAR | Status: AC
  Filled 2011-09-15: qty 2

## 2011-09-15 MED ORDER — DELFLEX-LM/1.5% DEXTROSE 346 MOSM/L IP SOLN: Freq: Once | INTRAPERITONEAL | Status: DC

## 2011-09-15 NOTE — Progress Notes (Signed)
Pt admitted to the unit. Pt is alert and oriented. Pt oriented to room, staff, and call bell. Bed in lowest position. Full assessment to Epic. Call bell with in reach. Told to call for assists. Pt remains hemodynamically stable. Will continue to monitor. Burley Saver, RN

## 2011-09-15 NOTE — Consult Note (Signed)
Reason for Consult: Hematemesis and blood in stool. Referring Physician: Family Medicine Teaching Service  [name] is an 61 y.o. female.   HPI: Pt is a 61 year old woman with H/O chronic pancreatitis, nausea/vomiting for the past year, and ESRD on home PD, Anemia, CAD- s/p CABG and Systolic CHF -s/p Pacemaker who is admitted for hematemesis- 3 bloody vomits early Sunday morning. No more hematemesis since admission. But started having bloody BM's for past 2 days- last BM yesterday.  Denies any CP, SOB, dizziness/vision changes. Complaints of some nausea but no vomit. Has epigastric abdominal pain which is essentially unchanged since admission. Last admission in [place] for about 2 weeks - had EGD- which showed only hiatal Hernia and no bleeding process. Was on ASA 81 mg daily PTA. S/p 2 PRBC transfusion. Hb stable at 9.2 today. Vitals WNL.   Past Medical History  Diagnosis Date  . Gallstone pancreatitis   . Coronary artery disease     s/p CABG 2008 with multiple PCIs  . Ischemic cardiomyopathy   . End stage renal disease   . Diabetes mellitus   . Hypertension   . History of nonadherence to medical treatment   . LBBB (left bundle branch block)   . Hyperlipidemia   . Renal insufficiency   . CHF (congestive heart failure)   . Dialysis patient   . Anemia     Past Surgical History  Procedure Date  . Coronary artery bypass graft 2008  . Cholecystectomy     Family History  Problem Relation Age of Onset  . Coronary artery disease Mother   . Hypertension Mother   . Diabetes Mother   . Diabetes Sister     Social History:  reports that she quit smoking about 11 years ago. She does not have any smokeless tobacco history on file. She reports that she does not drink alcohol or use illicit drugs.  Allergies:  Allergies  Allergen Reactions  . Morphine And Related Anaphylaxis  . Codeine Itching  . Lisinopril     Language changed    Medications: I have reviewed the  patient's current medications.  Results for orders placed during the hospital encounter of 09/13/11 (from the past 48 hour(s))  CBC     Status: Abnormal   Collection Time   09/13/11  4:12 PM      Component Value Range Comment   WBC 9.0  4.0 - 10.5 (K/uL)    RBC 2.10 (*) 3.87 - 5.11 (MIL/uL)    Hemoglobin 6.5 (*) 12.0 - 15.0 (g/dL)    HCT 40.9 (*) 81.1 - 46.0 (%)    MCV 96.2  78.0 - 100.0 (fL)    MCH 31.0  26.0 - 34.0 (pg)    MCHC 32.2  30.0 - 36.0 (g/dL)    RDW 91.4 (*) 78.2 - 15.5 (%)    Platelets 223  150 - 400 (K/uL)   DIFFERENTIAL     Status: Abnormal   Collection Time   09/13/11  4:12 PM      Component Value Range Comment   Neutrophils Relative 87 (*) 43 - 77 (%)    Neutro Abs 7.8 (*) 1.7 - 7.7 (K/uL)    Lymphocytes Relative 10 (*) 12 - 46 (%)    Lymphs Abs 0.9  0.7 - 4.0 (K/uL)    Monocytes Relative 3  3 - 12 (%)    Monocytes Absolute 0.3  0.1 - 1.0 (K/uL)    Eosinophils Relative 0  0 - 5 (%)  Eosinophils Absolute 0.0  0.0 - 0.7 (K/uL)    Basophils Relative 0  0 - 1 (%)    Basophils Absolute 0.0  0.0 - 0.1 (K/uL)   COMPREHENSIVE METABOLIC PANEL     Status: Abnormal   Collection Time   09/13/11  4:12 PM      Component Value Range Comment   Sodium 137  135 - 145 (mEq/L)    Potassium 3.5  3.5 - 5.1 (mEq/L)    Chloride 96  96 - 112 (mEq/L)    CO2 27  19 - 32 (mEq/L)    Glucose, Bld 155 (*) 70 - 99 (mg/dL)    BUN 51 (*) 6 - 23 (mg/dL)    Creatinine, Ser 5.40 (*) 0.50 - 1.10 (mg/dL)    Calcium 9.8  8.4 - 10.5 (mg/dL)    Total Protein 5.4 (*) 6.0 - 8.3 (g/dL)    Albumin 2.4 (*) 3.5 - 5.2 (g/dL)    AST 13  0 - 37 (U/L)    ALT 12  0 - 35 (U/L)    Alkaline Phosphatase 59  39 - 117 (U/L)    Total Bilirubin 0.2 (*) 0.3 - 1.2 (mg/dL)    GFR calc non Af Amer 4 (*) >90 (mL/min)    GFR calc Af Amer 5 (*) >90 (mL/min)   LIPASE, BLOOD     Status: Abnormal   Collection Time   09/13/11  4:12 PM      Component Value Range Comment   Lipase 147 (*) 11 - 59 (U/L)   PREPARE RBC  (CROSSMATCH)     Status: Normal   Collection Time   09/13/11  5:14 PM      Component Value Range Comment   Order Confirmation ORDER PROCESSED BY BLOOD BANK     TYPE AND SCREEN     Status: Normal   Collection Time   09/13/11  5:14 PM      Component Value Range Comment   ABO/RH(D) O POS      Antibody Screen POS      Sample Expiration 09/16/2011      Antibody Identification ANTI-K      DAT, IgG NEG      PT AG Type NEGATIVE FOR KELL ANTIGEN      Unit Number [id number]      Blood Component Type RED CELLS,LR      Unit division 00      Status of Unit ISSUED,FINAL      Donor AG Type NEGATIVE FOR KELL ANTIGEN      Transfusion Status OK TO TRANSFUSE      Crossmatch Result COMPATIBLE      Unit Number [id number]      Blood Component Type RED CELLS,LR      Unit division 00      Status of Unit ISSUED,FINAL      Donor AG Type NEGATIVE FOR KELL ANTIGEN      Transfusion Status OK TO TRANSFUSE      Crossmatch Result COMPATIBLE     TYPE AND SCREEN     Status: Normal   Collection Time   09/13/11  7:11 PM      Component Value Range Comment   ABO/RH(D) O POS      Antibody Screen POS      Sample Expiration 09/16/2011     IRON AND TIBC     Status: Normal   Collection Time   09/13/11  7:44 PM      Component Value Range  Comment   Iron 96  42 - 135 (ug/dL)    TIBC 161  096 - 045 (ug/dL)    Saturation Ratios 38  20 - 55 (%)    UIBC 157  125 - 400 (ug/dL)   FERRITIN     Status: Abnormal   Collection Time   09/13/11  7:44 PM      Component Value Range Comment   Ferritin 821 (*) 10 - 291 (ng/mL)   PREALBUMIN     Status: Normal   Collection Time   09/13/11  7:44 PM      Component Value Range Comment   Prealbumin 22.0  17.0 - 34.0 (mg/dL)   RETICULOCYTES     Status: Abnormal   Collection Time   09/13/11  8:12 PM      Component Value Range Comment   Retic Ct Pct 8.4 (*) 0.4 - 3.1 (%)    RBC. 2.07 (*) 3.87 - 5.11 (MIL/uL)    Retic Count, Manual 173.9  19.0 - 186.0 (K/uL)   GLUCOSE, CAPILLARY      Status: Abnormal   Collection Time   09/14/11  7:44 AM      Component Value Range Comment   Glucose-Capillary 171 (*) 70 - 99 (mg/dL)   MRSA PCR SCREENING     Status: Normal   Collection Time   09/14/11  8:49 AM      Component Value Range Comment   MRSA by PCR NEGATIVE  NEGATIVE    GLUCOSE, CAPILLARY     Status: Abnormal   Collection Time   09/14/11 11:17 AM      Component Value Range Comment   Glucose-Capillary 127 (*) 70 - 99 (mg/dL)   CBC     Status: Abnormal   Collection Time   09/14/11 11:27 AM      Component Value Range Comment   WBC 9.5  4.0 - 10.5 (K/uL)    RBC 2.89 (*) 3.87 - 5.11 (MIL/uL)    Hemoglobin 8.8 (*) 12.0 - 15.0 (g/dL)    HCT 40.9 (*) 81.1 - 46.0 (%)    MCV 92.4  78.0 - 100.0 (fL)    MCH 30.4  26.0 - 34.0 (pg)    MCHC 33.0  30.0 - 36.0 (g/dL)    RDW 91.4 (*) 78.2 - 15.5 (%)    Platelets 200  150 - 400 (K/uL)   BASIC METABOLIC PANEL     Status: Abnormal   Collection Time   09/14/11 11:35 AM      Component Value Range Comment   Sodium 131 (*) 135 - 145 (mEq/L)    Potassium 3.9  3.5 - 5.1 (mEq/L)    Chloride 89 (*) 96 - 112 (mEq/L)    CO2 23  19 - 32 (mEq/L)    Glucose, Bld 107 (*) 70 - 99 (mg/dL)    BUN 66 (*) 6 - 23 (mg/dL)    Creatinine, Ser 9.56 (*) 0.50 - 1.10 (mg/dL)    Calcium 9.9  8.4 - 10.5 (mg/dL)    GFR calc non Af Amer 4 (*) >90 (mL/min)    GFR calc Af Amer 5 (*) >90 (mL/min)   PROTIME-INR     Status: Abnormal   Collection Time   09/14/11 11:35 AM      Component Value Range Comment   Prothrombin Time 16.2 (*) 11.6 - 15.2 (seconds)    INR 1.27  0.00 - 1.49    GLUCOSE, CAPILLARY     Status: Abnormal   Collection  Time   09/14/11  4:03 PM      Component Value Range Comment   Glucose-Capillary 101 (*) 70 - 99 (mg/dL)    Comment 1 Notify RN     GLUCOSE, CAPILLARY     Status: Normal   Collection Time   09/14/11  9:37 PM      Component Value Range Comment   Glucose-Capillary 81  70 - 99 (mg/dL)    Comment 1 Notify RN      Comment 2 Documented in Chart      CBC     Status: Abnormal   Collection Time   09/15/11  4:20 AM      Component Value Range Comment   WBC 8.4  4.0 - 10.5 (K/uL)    RBC 2.97 (*) 3.87 - 5.11 (MIL/uL)    Hemoglobin 9.2 (*) 12.0 - 15.0 (g/dL)    HCT 16.1 (*) 09.6 - 46.0 (%)    MCV 93.9  78.0 - 100.0 (fL)    MCH 31.0  26.0 - 34.0 (pg)    MCHC 33.0  30.0 - 36.0 (g/dL)    RDW 04.5 (*) 40.9 - 15.5 (%)    Platelets 169  150 - 400 (K/uL)   COMPREHENSIVE METABOLIC PANEL     Status: Abnormal   Collection Time   09/15/11  4:20 AM      Component Value Range Comment   Sodium 136  135 - 145 (mEq/L)    Potassium 3.8  3.5 - 5.1 (mEq/L)    Chloride 92 (*) 96 - 112 (mEq/L)    CO2 28  19 - 32 (mEq/L)    Glucose, Bld 123 (*) 70 - 99 (mg/dL)    BUN 63 (*) 6 - 23 (mg/dL)    Creatinine, Ser 8.11 (*) 0.50 - 1.10 (mg/dL)    Calcium 91.4  8.4 - 10.5 (mg/dL)    Total Protein 5.7 (*) 6.0 - 8.3 (g/dL)    Albumin 2.7 (*) 3.5 - 5.2 (g/dL)    AST 12  0 - 37 (U/L)    ALT 11  0 - 35 (U/L)    Alkaline Phosphatase 55  39 - 117 (U/L)    Total Bilirubin 0.3  0.3 - 1.2 (mg/dL)    GFR calc non Af Amer 4 (*) >90 (mL/min)    GFR calc Af Amer 5 (*) >90 (mL/min)   GLUCOSE, CAPILLARY     Status: Abnormal   Collection Time   09/15/11  8:59 AM      Component Value Range Comment   Glucose-Capillary 130 (*) 70 - 99 (mg/dL)    Comment 1 Notify RN       Ct Abdomen Pelvis W Contrast  09/13/2011  *RADIOLOGY REPORT*  Clinical Data: Chronic pancreatitis.  Abdominal pain.  CT ABDOMEN AND PELVIS WITH CONTRAST  Technique:  Multidetector CT imaging of the abdomen and pelvis was performed following the standard protocol during bolus administration of intravenous contrast.  Contrast: OMNIPAQUE IOHEXOL 300 MG/ML IJ SOLN  Comparison: CT scan 02/02/2011.  Findings: The heart is enlarged but stable.  There are small bilateral pleural effusions, right greater than left with overlying small infiltrates.  The liver is unremarkable.  No focal lesions or intrahepatic biliary  dilatation.  The gallbladder surgically absent.  No common bile duct dilatation.  The spleen is normal in size.  No focal lesions.  The adrenal glands and kidneys are stable.  There are bilateral renal cysts.  On the prior CT scan there is  a small cystic area near the pancreatic body which was present be a pancreatic pseudocyst.  This has enlarged significantly since the prior study demonstrates high attenuation.  It is most likely a pancreatic pseudocyst with subsequent hemorrhage or less likely infection.  There is significant mass effect on the posterior wall of the stomach.  This measures a total of 6.3 x 3.5 cm.  No CT findings for acute pancreatitis.   Cystic changes noted in the pancreatic tail.  The stomach, duodenum, the small bowel and colon are unremarkable. The appendix is normal.  A peritoneal dialysis catheter is noted in the pelvis with a small amount of free abdominal and pelvic fluid. The bladder is normal.  The uterus is surgically absent.  No pelvic mass or adenopathy.  The bony structures are intact.  IMPRESSION:  1.  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  MRI of the abdomen without and with contrast may be helpful for further evaluation. 2.  Cystic changes in the pancreatic tail. 3.  Small bilateral pleural effusions with small overlying infiltrate. 4.  Stable cardiac enlargement. 5.  Peritoneal dialysis catheter in the pelvis with moderate free abdominal and pelvic fluid.  Original Report Authenticated By: P. Loralie Champagne, M.D.   Dg Abd Acute W/chest  09/13/2011  *RADIOLOGY REPORT*  Clinical Data: Nausea and vomiting.  ACUTE ABDOMEN SERIES (ABDOMEN 2 VIEW & CHEST 1 VIEW)  Comparison: CT scan 02/02/2011 and chest x-ray 06/02/2011.  Findings: The upright chest x-ray demonstrates pacer wires / AICD in good position without complicating features.  There are stable surgical changes  from triple bypass surgery.  The heart is enlarged but unchanged.  Mild vascular congestion but no pulmonary edema or pleural effusion.  Two views of the abdomen demonstrate scattered air and stool throughout the colon and down to the rectum.  No distended small bowel loops to suggest obstruction.  No free air.  A peritoneal dialysis catheter is noted in the pelvis.  Stable surgical changes. The bony structures are intact.  IMPRESSION:  1.  No acute cardiopulmonary findings.  Stable cardiac enlargement. 2.  No plain film findings for an acute abdominal process.  Original Report Authenticated By: P. Loralie Champagne, M.D.    ROS:  As stated above in the HPI otherwise negative.  Blood pressure 133/76, pulse 70, temperature 97.9 F (36.6 C), temperature source Oral, resp. rate 20, height 5\' 4"  (1.626 m), weight 140 lb 6.9 oz (63.7 kg), SpO2 96.00%.  PE: Gen: NAD, Alert and Oriented HEENT:  /AT, EOMI Neck: Supple, no LAD Lungs: CTA Bilaterally CV: RRR without M/G/R ABM: Soft, mildly tender to palpation in epigastric and mid abdomen, +BS Ext: No C/C/E  Assessment/Plan: 1) Acute UGI bleeding/ blood in stool: Likely 2/2 pancreatic pseudocyst compressing the Antral area seen per CT along with retching from vomiting ( Mallory Weiss tear in DD but less likely due to obvious CT findings). - Blood in stool likely from UGI source- but is not dark ( as per patient ). - Hb stabilized. - EGD today - will keep NPO  2)  Anemia : Hb 9.2< 8.8< 6.5 S/p 2 PRBC transfusion. Acute on chronic Anemia due to GI bleed. - Transfuse with goal Hb >8 considering extensive Cardiac Hx. - Continue Aranesp. - Continue Hold ASA  3) ESRD: Continue PD per primary team.  4) Systolic CHF: Stable at present.  Mx per primary team.  PATEL,RAVI 09/15/2011, 10:32 AM    Further evaluation with an EGD is required because of this antral mass effect from the pseudocyst.  There is no recent ETOH history.  In fact she denies any  ETOH since the age of 69.  Hematochezia was a new event for her and this started shortly after her hematemesis episode.  She described the bleeding as maroon stools.  Her last colonoscopy was this past October in [place] and two polyps were removed.  She does not know if she has diverticula.

## 2011-09-15 NOTE — Progress Notes (Signed)
Patient ID: [name], female   DOB: 08-07-1950, 61 y.o.   MRN: [id number] PGY-1 Daily Progress Note Family Medicine Teaching Service Yuritzy Zehring M. Ioanna Colquhoun, MD Service Pager: [phone]  Subjective: Patient doing well this morning. No bleeding noted since yesterday afternoon. She continues to have epigastric pain and nausea.  Objective: Vital signs in last 24 hours: Temp:  [97.5 F (36.4 C)-97.9 F (36.6 C)] 97.9 F (36.6 C) (04/02 0700) Pulse Rate:  [69-88] 70  (04/02 0409) Resp:  [14-20] 20  (04/02 0409) BP: (107-142)/(65-97) 133/76 mmHg (04/02 0700) SpO2:  [92 %-100 %] 96 % (04/02 0409) Weight:  [140 lb 6.9 oz (63.7 kg)] 140 lb 6.9 oz (63.7 kg) (04/02 0409) Weight change: -9.1 oz (-0.257 kg) Last BM Date: 09/14/11  Intake/Output from previous day: 04/01 0701 - 04/02 0700 In: 187 [I.V.:183; IV Piggyback:4] Out: -  Intake/Output this shift:   General appearance: alert and more comfortable lying in bed. Head: Normocephalic, without obvious abnormality, atraumatic Resp: clear to auscultation bilaterally. (Some crackles which resolve with cough) Cardio: regular rate and rhythm GI: Not fully examined secondary to pain Extremities: extremities normal, atraumatic, no cyanosis or edema Neurologic: Grossly normal  Lab Results:  Basename 09/15/11 0420 09/14/11 1127  WBC 8.4 9.5  HGB 9.2* 8.8*  HCT 27.9* 26.7*  PLT 169 200   BMET  Basename 09/15/11 0420 09/14/11 1135  NA 136 131*  K 3.8 3.9  CL 92* 89*  CO2 28 23  GLUCOSE 123* 107*  BUN 63* 66*  CREATININE 9.33* 9.10*  CALCIUM 10.1 9.9    Studies/Results: Ct Abdomen Pelvis W Contrast  09/13/2011  *RADIOLOGY REPORT*  Clinical Data: Chronic pancreatitis.  Abdominal pain.  CT ABDOMEN AND PELVIS WITH CONTRAST  Technique:  Multidetector CT imaging of the abdomen and pelvis was performed following the standard protocol during bolus administration of intravenous contrast.  Contrast: OMNIPAQUE IOHEXOL 300 MG/ML IJ SOLN   Comparison: CT scan 02/02/2011.  Findings: The heart is enlarged but stable.  There are small bilateral pleural effusions, right greater than left with overlying small infiltrates.  The liver is unremarkable.  No focal lesions or intrahepatic biliary dilatation.  The gallbladder surgically absent.  No common bile duct dilatation.  The spleen is normal in size.  No focal lesions.  The adrenal glands and kidneys are stable.  There are bilateral renal cysts.  On the prior CT scan there is a small cystic area near the pancreatic body which was present be a pancreatic pseudocyst.  This has enlarged significantly since the prior study demonstrates high attenuation.  It is most likely a pancreatic pseudocyst with subsequent hemorrhage or less likely infection.  There is significant mass effect on the posterior wall of the stomach.  This measures a total of 6.3 x 3.5 cm.  No CT findings for acute pancreatitis.   Cystic changes noted in the pancreatic tail.  The stomach, duodenum, the small bowel and colon are unremarkable. The appendix is normal.  A peritoneal dialysis catheter is noted in the pelvis with a small amount of free abdominal and pelvic fluid. The bladder is normal.  The uterus is surgically absent.  No pelvic mass or adenopathy.  The bony structures are intact.  IMPRESSION:  1.  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  MRI of the abdomen without and with contrast may be helpful for further evaluation. 2.  Cystic changes in  the pancreatic tail. 3.  Small bilateral pleural effusions with small overlying infiltrate. 4.  Stable cardiac enlargement. 5.  Peritoneal dialysis catheter in the pelvis with moderate free abdominal and pelvic fluid.  Original Report Authenticated By: P. Loralie Champagne, M.D.   Dg Abd Acute W/chest  09/13/2011  *RADIOLOGY REPORT*  Clinical Data: Nausea and vomiting.  ACUTE ABDOMEN  SERIES (ABDOMEN 2 VIEW & CHEST 1 VIEW)  Comparison: CT scan 02/02/2011 and chest x-ray 06/02/2011.  Findings: The upright chest x-ray demonstrates pacer wires / AICD in good position without complicating features.  There are stable surgical changes from triple bypass surgery.  The heart is enlarged but unchanged.  Mild vascular congestion but no pulmonary edema or pleural effusion.  Two views of the abdomen demonstrate scattered air and stool throughout the colon and down to the rectum.  No distended small bowel loops to suggest obstruction.  No free air.  A peritoneal dialysis catheter is noted in the pelvis.  Stable surgical changes. The bony structures are intact.  IMPRESSION:  1.  No acute cardiopulmonary findings.  Stable cardiac enlargement. 2.  No plain film findings for an acute abdominal process.  Original Report Authenticated By: P. Loralie Champagne, M.D.   Medications:  I have reviewed the patient's current medications. Scheduled:    . carvedilol  6.25 mg Oral BID WC  . doxazosin  2 mg Oral QHS  . insulin aspart  0-15 Units Subcutaneous TID WC  . pantoprazole  40 mg Oral Q1200  . ranolazine  500 mg Oral Daily  . sevelamer  3,200 mg Oral TID WC  . sodium chloride  3 mL Intravenous Q12H   Continuous:    . sodium chloride    . dialysis solution 1.5% low-MG/low-CA 5,000 mL (09/14/11 2200)  . dialysis solution 2.5% low-MG/low-CA 5,000 mL (09/14/11 2200)   ZOX:[id number] (DILAUDID) injection, ondansetron (ZOFRAN) IV, DISCONTD:  HYDROmorphone (DILAUDID) injection  Assessment/Plan: This is a 61 year old African-American female with a history of chronic pancreatitis, nausea/vomiting for the past year, and ESRD on home peritoneal dialysis who presents with worsening nausea/vomiting.   1.Pancreatic pseudocyst--the patient was recently hospitalized for N/V and hematemesis at the [facility] from 03/05-03/19. CT-abd/pelvis 01/2011 (most recent one done at [facility]) showed pancreas with two  peripancreatic fluid collections (likely pseudocysts) along body/tail with mild peripancreatic stranding consistent with mild pancreatitis. History of chronic pancreatitis since 2003 attributed to gallstones. Improved following cholecystectomy but has been having symptoms (abdomina pain/nausea/vomiting) consistent with previous pancreatitis for the past year. Patient told recent hospitalization she has pancreatic pseudocysts. Repeat CT scan done today shows enlarging of pseudocyst with hemorrhagic process. Lipase elevated at 147 (in the past 50mo ago, 260-270). However, patient afebrile and WBC WNL. LFTs not elevated. Patient admitted with hematemesis, developed rectal bleeding yesterday. Since then, no vomiting and decreased rectal bleeding - Continue Dilaudid 0.5 q4hrs prn pain.  - Continue Zofran prn for nausea - Surgery consulted on patient and we appreciate their input. - Will consult GI today for further evaluation of bleeding. - Patient brought her records from [facility] with her to the hospital but they were lost in transfer. Patient signed a ROI form to request records with d/c summary, EGD and other procedures. As of now, those records have not been sent - Patient currently in SDU due to uncertain prognosis of her condition yesterday with active bleeding. More stable today, will transfer back to telemetry bed.  2. Anemia--she has a longstanding history of anemia for several years.  Previously, her anemia was attributed to heavy periods, however, her anemia has persisted after menopause. She receives periodic blood transfusions. The cause of her anemia is unknown. She has seen hematologists in the past. She thinks her chronic renal disease is the cause of her anemia. Her baseline hemoglobin is around 8. She receives Aranesp treatments weekly. During her recent hospitalization, she saw a GI specialist who performed an EGD. The only significant finding per patient was a hiatal hernia. Anemia on  admission likely 2/2 to GI bleed. Patient is asymptomatic from her anemia (no tachycardia, BP stable) which is not surprising given her long standing history.  - Hgb 6.5 on arrival. S/p 2U PRBC, HgB 9.2 this morning. - Continue to hold home ASA - If patient develops any type of edema from blood transfusion will consider giving Lasix - Recheck CBC tomorrow AM  3. History of ESRD on peritoneal dialysis--secondary to poorly controlled hypertension and diabetes secondary to medical non-compliance. Started peritoneal dialysis about 1 year ago.  - Dialysis every day. Electrolytes okay currently on admission. - Renal service following, and we appreciate their help with this patient's care - Patient makes urine (about 1/2 cup daily).  - Home medications: renolazine, sevelamer which are being continued at this time.   4. Cardiology--patient reports history of HTN but blood pressures not atypically 100s systolics.  - History of severe chronic systolic heart failure--05/2011 TEE: EF 5-10%. Followed by heart failure cardiologist in [facility]. She was recently hospitalized 05/2011 for acute on chronic heart failure secondary to blood transfusion. She denies any symptoms at this time during blood transfusion, but she knows her body well enough to let us know if she has any changes - History of CAD s/p CABG 2008 with multiple PCI  - History of ischemic cardiomyopathy--although patient denies history of heart attack.  - History of hyperlipidemia  - Fluid restriction. Monitor for signs of fluid overload during transfusion.  - Home medications: Coreg 25 bid, doxazosin 4 qhs, hydralazine 25 bid/Imdur, pravastatin 20>>>hold hydralazine/Imdur, pravstatin. Continue doxazosin, Coreg at reduced dose.  - If patient becomes symptomatic in any way, will not delay a cardiology consult given her multiple comorbidities  5. T2DM--recent HgbA1c between 5-6. She uses insulin intermittently. It had been poorly controlled in the  past.  - Home medications: Lantus 10 prn. - Continue SSI and monitor CBG  6. FEN/GI  -IVF: NS @ KVO.  -Diet: NPO. Sips with meds  7. PPx:  -DVT PPx: SCD (no heparin 2/2 anemia and GI bleed)  -SUP: Protonix   Dispo: pending clinical improvement. Awaiting surgery consult.   CODE: DNR/DNI   LOS: 2 days   Nicky Kras 09/15/2011, 8:28 AM

## 2011-09-15 NOTE — Progress Notes (Signed)
Pt returned from Endo. Denies complaints of pain. Pt is alert and oriented but easily drifts off to sleep. Capable of voicing needs.  CBG: 68    Treatment: 15 GM carbohydrate snack  Symptoms: None  Follow-up CBG: Time:1850 CBG Result:79  Possible Reasons for Event: Inadequate meal intake  Comments/MD notified:Pt was NPO for procedure. Pt is now continued on clear liquid diet per order    Christell Constant, Dirk Dress

## 2011-09-15 NOTE — Op Note (Signed)
[facility] [address] [place], [place]  [address]  OPERATIVE PROCEDURE REPORT  PATIENT:  [name], [name]  MR#:  [id number] BIRTHDATE:  March 22, 1951  GENDER:  female ENDOSCOPIST:  Jeani Hawking, MD PROCEDURE DATE:  09/15/2011 PROCEDURE:  EGD with biopsy, 84132 ASA CLASS:  Class III INDICATIONS:  Nausea/Vomiting and abnormal CT scan MEDICATIONS:  Fentanyl 75 mcg IV, Versed 8 mg IV TOPICAL ANESTHETIC:  Viscous Xylocaine  DESCRIPTION OF PROCEDURE:   After the risks benefits and alternatives of the procedure were thoroughly explained, informed consent was obtained.  The EG-[id number] ([id number]) endoscope was introduced through the mouth and advanced to the second portion of the duodenum, without limitations.  The instrument was slowly withdrawn as the mucosa was fully examined. <<PROCEDUREIMAGES>>  FINDINGS:  The Z-line was sharp and there was no evidence of any inflammation or bleeding. The antrum was thickened and there was evidence of two surgical sutures. Multiple cold biopsies were obtained. The gastric lument also displayed a moderate gastritis. The endoscope was then passed into the duodenal bulb and there was a large dudenal bulb lesion. Multiple cold biopsies were obtained. The combination of the antral thickening and the duodenal lesion can result in a relative gastric outlet obstruction. No other abnormalities identified.    Retroflexed views revealed no abnormalities.    The scope was then withdrawn from the patient and the procedure terminated.  COMPLICATIONS:  None  IMPRESSION:  1) Mild gastritis 2) Antral thickening. 3) Duodenal bulb mass/lesion/inflammation. RECOMMENDATIONS:  1) Await biopsy results  ______________________________ Jeani Hawking, MD  n. Rosalie DoctorJeani Hawking at 09/15/2011 05:20 PM  Michelle Nasuti, [phone]

## 2011-09-15 NOTE — Progress Notes (Signed)
nausea

## 2011-09-15 NOTE — Progress Notes (Signed)
Pt. Being transferred to 6732 via wheelchair. Phone report called to [facility]. Pt. Is aware of transfer and new room number.

## 2011-09-15 NOTE — Progress Notes (Signed)
[facility] Progress Note   [name] is an 61 y.o. female with ESRD who is admitted with complaint of nausea and vomiting and bloody emesis with ABLA. She has a history of chronic pancreatitis.  Dialyzes via peritoneal dialysis since 2012. She recieves her treatments via the [facility].  Plan Peritoneal dialysis today. Use 1.5 & 2.5%.  Subjective:   Feels better. Tolerated PD   Objective:   BP 133/76  Pulse 70  Temp(Src) 97.9 F (36.6 C) (Oral)  Resp 20  Ht 5' 4" (1.626 m)  Wt 63.7 kg (140 lb 6.9 oz)  BMI 24.11 kg/m2  SpO2 96%  Intake/Output Summary (Last 24 hours) at 09/15/11 1118 Last data filed at 09/15/11 0800  Gross per 24 hour  Intake    227 ml  Output      0 ml  Net    227 ml   Weight change: -0.257 kg (-9.1 oz)  Physical Exam: Gen:waaaf CVS:RRR Resp:clear Abd:soft Ext:tr edema  Imaging: Ct Abdomen Pelvis W Contrast  09/13/2011  *RADIOLOGY REPORT*  Clinical Data: Chronic pancreatitis.  Abdominal pain.  CT ABDOMEN AND PELVIS WITH CONTRAST  Technique:  Multidetector CT imaging of the abdomen and pelvis was performed following the standard protocol during bolus administration of intravenous contrast.  Contrast: 100mL OMNIPAQUE IOHEXOL 300 MG/ML IJ SOLN  Comparison: CT scan 02/02/2011.  Findings: The heart is enlarged but stable.  There are small bilateral pleural effusions, right greater than left with overlying small infiltrates.  The liver is unremarkable.  No focal lesions or intrahepatic biliary dilatation.  The gallbladder surgically absent.  No common bile duct dilatation.  The spleen is normal in size.  No focal lesions.  The adrenal glands and kidneys are stable.  There are bilateral renal cysts.  On the prior CT scan there is a small cystic area near the pancreatic body which was present be a pancreatic pseudocyst.  This has enlarged significantly since the prior study demonstrates high attenuation.  It is most likely a pancreatic  pseudocyst with subsequent hemorrhage or less likely infection.  There is significant mass effect on the posterior wall of the stomach.  This measures a total of 6.3 x 3.5 cm.  No CT findings for acute pancreatitis.   Cystic changes noted in the pancreatic tail.  The stomach, duodenum, the small bowel and colon are unremarkable. The appendix is normal.  A peritoneal dialysis catheter is noted in the pelvis with a small amount of free abdominal and pelvic fluid. The bladder is normal.  The uterus is surgically absent.  No pelvic mass or adenopathy.  The bony structures are intact.  IMPRESSION:  1.  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  MRI of the abdomen without and with contrast may be helpful for further evaluation. 2.  Cystic changes in the pancreatic tail. 3.  Small bilateral pleural effusions with small overlying infiltrate. 4.  Stable cardiac enlargement. 5.  Peritoneal dialysis catheter in the pelvis with moderate free abdominal and pelvic fluid.  Original Report Authenticated By: P. MARK GALLERANI, M.D.   Dg Abd Acute W/chest  09/13/2011  *RADIOLOGY REPORT*  Clinical Data: Nausea and vomiting.  ACUTE ABDOMEN SERIES (ABDOMEN 2 VIEW & CHEST 1 VIEW)  Comparison: CT scan 02/02/2011 and chest x-ray 06/02/2011.  Findings: The upright chest x-ray demonstrates pacer wires / AICD in good position without complicating features.  There are stable   surgical changes from triple bypass surgery.  The heart is enlarged but unchanged.  Mild vascular congestion but no pulmonary edema or pleural effusion.  Two views of the abdomen demonstrate scattered air and stool throughout the colon and down to the rectum.  No distended small bowel loops to suggest obstruction.  No free air.  A peritoneal dialysis catheter is noted in the pelvis.  Stable surgical changes. The bony structures are intact.  IMPRESSION:  1.  No  acute cardiopulmonary findings.  Stable cardiac enlargement. 2.  No plain film findings for an acute abdominal process.  Original Report Authenticated By: P. MARK GALLERANI, M.D.    Labs: BMET  Lab 09/15/11 0420 09/14/11 1135 09/13/11 1612  NA 136 131* 137  K 3.8 3.9 3.5  CL 92* 89* 96  CO2 28 23 27  GLUCOSE 123* 107* 155*  BUN 63* 66* 51*  CREATININE 9.33* 9.10* 8.96*  ALB -- -- --  CALCIUM 10.1 9.9 9.8  PHOS -- -- --   CBC  Lab 09/15/11 0420 09/14/11 1127 09/13/11 1612  WBC 8.4 9.5 9.0  NEUTROABS -- -- 7.8*  HGB 9.2* 8.8* 6.5*  HCT 27.9* 26.7* 20.2*  MCV 93.9 92.4 96.2  PLT 169 200 223    Medications:      . carvedilol  6.25 mg Oral BID WC  . doxazosin  2 mg Oral QHS  . insulin aspart  0-15 Units Subcutaneous TID WC  . pantoprazole  40 mg Oral Q1200  . ranolazine  500 mg Oral Daily  . sevelamer  3,200 mg Oral TID WC  . sodium chloride  3 mL Intravenous Q12H     

## 2011-09-15 NOTE — H&P (View-Only) (Signed)
[facility] Progress Note   [name] is an 61 y.o. female with ESRD who is admitted with complaint of nausea and vomiting and bloody emesis with ABLA. She has a history of chronic pancreatitis.  Dialyzes via peritoneal dialysis since 2012. She recieves her treatments via the [facility].  Plan Peritoneal dialysis today. Use 1.5 & 2.5%.  Subjective:   Feels better. Tolerated PD   Objective:   BP 133/76  Pulse 70  Temp(Src) 97.9 F (36.6 C) (Oral)  Resp 20  Ht 5\' 4"  (1.626 m)  Wt 63.7 kg (140 lb 6.9 oz)  BMI 24.11 kg/m2  SpO2 96%  Intake/Output Summary (Last 24 hours) at 09/15/11 1118 Last data filed at 09/15/11 0800  Gross per 24 hour  Intake    227 ml  Output      0 ml  Net    227 ml   Weight change: -0.257 kg (-9.1 oz)  Physical Exam: WUJ:WJXBJ CVS:RRR Resp:clear YNW:GNFA Ext:tr edema  Imaging: Ct Abdomen Pelvis W Contrast  09/13/2011  *RADIOLOGY REPORT*  Clinical Data: Chronic pancreatitis.  Abdominal pain.  CT ABDOMEN AND PELVIS WITH CONTRAST  Technique:  Multidetector CT imaging of the abdomen and pelvis was performed following the standard protocol during bolus administration of intravenous contrast.  Contrast: OMNIPAQUE IOHEXOL 300 MG/ML IJ SOLN  Comparison: CT scan 02/02/2011.  Findings: The heart is enlarged but stable.  There are small bilateral pleural effusions, right greater than left with overlying small infiltrates.  The liver is unremarkable.  No focal lesions or intrahepatic biliary dilatation.  The gallbladder surgically absent.  No common bile duct dilatation.  The spleen is normal in size.  No focal lesions.  The adrenal glands and kidneys are stable.  There are bilateral renal cysts.  On the prior CT scan there is a small cystic area near the pancreatic body which was present be a pancreatic pseudocyst.  This has enlarged significantly since the prior study demonstrates high attenuation.  It is most likely a pancreatic  pseudocyst with subsequent hemorrhage or less likely infection.  There is significant mass effect on the posterior wall of the stomach.  This measures a total of 6.3 x 3.5 cm.  No CT findings for acute pancreatitis.   Cystic changes noted in the pancreatic tail.  The stomach, duodenum, the small bowel and colon are unremarkable. The appendix is normal.  A peritoneal dialysis catheter is noted in the pelvis with a small amount of free abdominal and pelvic fluid. The bladder is normal.  The uterus is surgically absent.  No pelvic mass or adenopathy.  The bony structures are intact.  IMPRESSION:  1.  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  MRI of the abdomen without and with contrast may be helpful for further evaluation. 2.  Cystic changes in the pancreatic tail. 3.  Small bilateral pleural effusions with small overlying infiltrate. 4.  Stable cardiac enlargement. 5.  Peritoneal dialysis catheter in the pelvis with moderate free abdominal and pelvic fluid.  Original Report Authenticated By: P. Loralie Champagne, M.D.   Dg Abd Acute W/chest  09/13/2011  *RADIOLOGY REPORT*  Clinical Data: Nausea and vomiting.  ACUTE ABDOMEN SERIES (ABDOMEN 2 VIEW & CHEST 1 VIEW)  Comparison: CT scan 02/02/2011 and chest x-ray 06/02/2011.  Findings: The upright chest x-ray demonstrates pacer wires / AICD in good position without complicating features.  There are stable  surgical changes from triple bypass surgery.  The heart is enlarged but unchanged.  Mild vascular congestion but no pulmonary edema or pleural effusion.  Two views of the abdomen demonstrate scattered air and stool throughout the colon and down to the rectum.  No distended small bowel loops to suggest obstruction.  No free air.  A peritoneal dialysis catheter is noted in the pelvis.  Stable surgical changes. The bony structures are intact.  IMPRESSION:  1.  No  acute cardiopulmonary findings.  Stable cardiac enlargement. 2.  No plain film findings for an acute abdominal process.  Original Report Authenticated By: P. Loralie Champagne, M.D.    Labs: BMET  Lab 09/15/11 0420 09/14/11 1135 09/13/11 1612  NA 136 131* 137  K 3.8 3.9 3.5  CL 92* 89* 96  CO2 28 23 27   GLUCOSE 123* 107* 155*  BUN 63* 66* 51*  CREATININE 9.33* 9.10* 8.96*  ALB -- -- --  CALCIUM 10.1 9.9 9.8  PHOS -- -- --   CBC  Lab 09/15/11 0420 09/14/11 1127 09/13/11 1612  WBC 8.4 9.5 9.0  NEUTROABS -- -- 7.8*  HGB 9.2* 8.8* 6.5*  HCT 27.9* 26.7* 20.2*  MCV 93.9 92.4 96.2  PLT 169 200 223    Medications:      . carvedilol  6.25 mg Oral BID WC  . doxazosin  2 mg Oral QHS  . insulin aspart  0-15 Units Subcutaneous TID WC  . pantoprazole  40 mg Oral Q1200  . ranolazine  500 mg Oral Daily  . sevelamer  3,200 mg Oral TID WC  . sodium chloride  3 mL Intravenous Q12H

## 2011-09-15 NOTE — Interval H&P Note (Signed)
History and Physical Interval Note:  09/15/2011 4:47 PM  [name]  has presented today for surgery, with the diagnosis of Hematemesis  The various methods of treatment have been discussed with the patient and family. After consideration of risks, benefits and other options for treatment, the patient has consented to  Procedure(s) (LRB): ESOPHAGOGASTRODUODENOSCOPY (EGD) (N/A) as a surgical intervention .  The patients' history has been reviewed, patient examined, no change in status, stable for surgery.  I have reviewed the patients' chart and labs.  Questions were answered to the patient's satisfaction.     Jailine Lieder D

## 2011-09-15 NOTE — Progress Notes (Signed)
UR Completed.  [name] Jane [phone] 09/15/2011  

## 2011-09-15 NOTE — Progress Notes (Addendum)
CBG: 59  Treatment: D50 IV 25 mL  Symptoms: None  Follow-up CBG: Time:1225 CBG Result:124  Possible Reasons for Event: Inadequate meal intake  Comments/MD notified:teaching service/ Dr Raeanne Barry, Dirk Dress

## 2011-09-15 NOTE — Progress Notes (Signed)
I have seen and examined this patient. I have discussed with Dr Mikel Cella.  I agree with their findings and plans as documented in their progress note for today.  Acute Issues 1. Hematemesis with rectal bleeding.  - hemodynamically stable. Hgb 9.2 (stable) - Planned EGD today - High Dose PPI  2. Pancreatic Pseudocyst - Increase in size since CT scan 01/2011 - Uncertain origin of blood in pseudocyst.   Goals of EGD diagnosis, oral adequate analgesia and antiemetics.

## 2011-09-15 NOTE — Progress Notes (Signed)
General Surgery Note  LOS: 2 days   Assessment/Plan: 1.  Pancreatic pseudocyst (6.5 x 3.5 cm)  in face of chronic pancreatitis.  Increase in size since CT scan here on 02/02/2011.  Patient was recently hospitalized for nausea and vomiting and to check her pacemaker at [facility].  It would be interesting to see if they did a CT scan there and what the results were.  Anyway, I this time, there is nothing further that needs to be done to the pseudocyst.  Will follow.    2.  Chronic systolic congestive heart failure.  Has pacemaker. 3. Hypertension   4.  End stage renal disease   On PD, managed in [place]. 5.  Diabetes Mellitus. 6.  UGI bleed.  Stable Hgb today (9.2), no further vomiting or blood.  GI to consult.  [Dr. Mikel Cella at bedside with patient]  Subjective:  Complains of vague epigastric pain.  I did talk to the patient that she is cared for by 3 medical communities:  [facility], [facility], and [place] system (most recently at [facility]) and that this can complicate timely care.  Objective:   Filed Vitals:   09/15/11 0700  BP: 133/76  Pulse:   Temp: 97.9 F (36.6 C)  Resp:      Intake/Output from previous day:  04/01 0701 - 04/02 0700 In: 207 [I.V.:203; IV Piggyback:4] Out: -   Intake/Output this shift:  Total I/O In: 20 [I.V.:20] Out: -    Physical Exam:   General: Older AA F who is alert and oriented.    HEENT: Normal. Pupils equal. .   Lungs: Clear.   Abdomen: Soft, BS present.   Wound: PD cath in [facility] (this was placed in [facility])   Neurologic:  Grossly intact to motor and sensory function.   Lab Results:    Basename 09/15/11 0420 09/14/11 1127  WBC 8.4 9.5  HGB 9.2* 8.8*  HCT 27.9* 26.7*  PLT 169 200    BMET   Basename 09/15/11 0420 09/14/11 1135  NA 136 131*  K 3.8 3.9  CL 92* 89*  CO2 28 23  GLUCOSE 123* 107*  BUN 63* 66*  CREATININE 9.33* 9.10*  CALCIUM 10.1 9.9    PT/INR   Basename 09/14/11 1135  LABPROT 16.2*  INR 1.27    ABG  No  results found for this basename: PHART:2,PCO2:2,PO2:2,HCO3:2 in the last 72 hours   Studies/Results:  Ct Abdomen Pelvis W Contrast  09/13/2011  *RADIOLOGY REPORT*  Clinical Data: Chronic pancreatitis.  Abdominal pain.  CT ABDOMEN AND PELVIS WITH CONTRAST  Technique:  Multidetector CT imaging of the abdomen and pelvis was performed following the standard protocol during bolus administration of intravenous contrast.  Contrast: OMNIPAQUE IOHEXOL 300 MG/ML IJ SOLN  Comparison: CT scan 02/02/2011.  Findings: The heart is enlarged but stable.  There are small bilateral pleural effusions, right greater than left with overlying small infiltrates.  The liver is unremarkable.  No focal lesions or intrahepatic biliary dilatation.  The gallbladder surgically absent.  No common bile duct dilatation.  The spleen is normal in size.  No focal lesions.  The adrenal glands and kidneys are stable.  There are bilateral renal cysts.  On the prior CT scan there is a small cystic area near the pancreatic body which was present be a pancreatic pseudocyst.  This has enlarged significantly since the prior study demonstrates high attenuation.  It is most likely a pancreatic pseudocyst with subsequent hemorrhage or less likely  infection.  There is significant mass effect on the posterior wall of the stomach.  This measures a total of 6.3 x 3.5 cm.  No CT findings for acute pancreatitis.   Cystic changes noted in the pancreatic tail.  The stomach, duodenum, the small bowel and colon are unremarkable. The appendix is normal.  A peritoneal dialysis catheter is noted in the pelvis with a small amount of free abdominal and pelvic fluid. The bladder is normal.  The uterus is surgically absent.  No pelvic mass or adenopathy.  The bony structures are intact.  IMPRESSION:  1.  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  MRI of the abdomen  without and with contrast may be helpful for further evaluation. 2.  Cystic changes in the pancreatic tail. 3.  Small bilateral pleural effusions with small overlying infiltrate. 4.  Stable cardiac enlargement. 5.  Peritoneal dialysis catheter in the pelvis with moderate free abdominal and pelvic fluid.  Original Report Authenticated By: P. Loralie Champagne, M.D.   Dg Abd Acute W/chest  09/13/2011  *RADIOLOGY REPORT*  Clinical Data: Nausea and vomiting.  ACUTE ABDOMEN SERIES (ABDOMEN 2 VIEW & CHEST 1 VIEW)  Comparison: CT scan 02/02/2011 and chest x-ray 06/02/2011.  Findings: The upright chest x-ray demonstrates pacer wires / AICD in good position without complicating features.  There are stable surgical changes from triple bypass surgery.  The heart is enlarged but unchanged.  Mild vascular congestion but no pulmonary edema or pleural effusion.  Two views of the abdomen demonstrate scattered air and stool throughout the colon and down to the rectum.  No distended small bowel loops to suggest obstruction.  No free air.  A peritoneal dialysis catheter is noted in the pelvis.  Stable surgical changes. The bony structures are intact.  IMPRESSION:  1.  No acute cardiopulmonary findings.  Stable cardiac enlargement. 2.  No plain film findings for an acute abdominal process.  Original Report Authenticated By: P. Loralie Champagne, M.D.     Anti-infectives:   Anti-infectives    None      Ovidio Kin, MD, FACS Pager: [phone],   [facility] Surgery Office: [phone] 09/15/2011

## 2011-09-16 ENCOUNTER — Encounter (HOSPITAL_COMMUNITY): Payer: Self-pay | Admitting: Gastroenterology

## 2011-09-16 LAB — GLUCOSE, CAPILLARY: Glucose-Capillary: 107 mg/dL — ABNORMAL HIGH (ref 70–99)

## 2011-09-16 LAB — BASIC METABOLIC PANEL
BUN: 57 mg/dL — ABNORMAL HIGH (ref 6–23)
GFR calc Af Amer: 4 mL/min — ABNORMAL LOW (ref >90)
GFR calc non Af Amer: 4 mL/min — ABNORMAL LOW (ref >90)
Potassium: 2.8 mEq/L — ABNORMAL LOW (ref 3.5–5.1)

## 2011-09-16 LAB — HEMOGLOBIN AND HEMATOCRIT, BLOOD: Hemoglobin: 8.8 g/dL — ABNORMAL LOW (ref 12.0–15.0)

## 2011-09-16 LAB — CBC
Hemoglobin: 7.9 g/dL — ABNORMAL LOW (ref 12.0–15.0)
MCHC: 32.8 g/dL (ref 30.0–36.0)
RDW: 21 % — ABNORMAL HIGH (ref 11.5–15.5)

## 2011-09-16 MED ORDER — POTASSIUM CHLORIDE CRYS ER 20 MEQ PO TBCR
40.0000 meq | EXTENDED_RELEASE_TABLET | Freq: Two times a day (BID) | ORAL | Status: AC
  Administered 2011-09-16 – 2011-09-17 (×2): 40 meq via ORAL
  Filled 2011-09-16 (×2): qty 2

## 2011-09-16 NOTE — Progress Notes (Signed)
I discussed with Dr Mikel Cella.  I agree with their plans documented in their progress note for today.  EGD found Duodenal bulb lesion, antral thickening, possible GOO, and gastritis.  Await Bx.  On High Dose PPI.

## 2011-09-16 NOTE — Progress Notes (Signed)
  [facility] Progress Note  [name] is an 61 y.o. female with ESRD who is admitted with complaint of nausea and vomiting and bloody emesis with ABLA. She has a history of chronic pancreatitis.  Dialyzes via peritoneal dialysis since 2012. She recieves her treatments via the [facility].  Hypokalemia Plan- Peritoneal dialysis today. Use 1.5 & 2.5%., Potassium replace   Subjective:   S/p endo today feel about the same   Objective:   BP 131/85  Pulse 87  Temp(Src) 97.5 F (36.4 C) (Oral)  Resp 20  Ht 5\' 4"  (1.626 m)  Wt 60.056 kg (132 lb 6.4 oz)  BMI 22.73 kg/m2  SpO2 97%  Intake/Output Summary (Last 24 hours) at 09/16/11 1622 Last data filed at 09/16/11 0900  Gross per 24 hour  Intake    720 ml  Output      0 ml  Net    720 ml   Weight change: -3.644 kg (-8 lb 0.5 oz)  Physical Exam: Gen:waf CVS:RRR Resp:clear JYN:WGNF AOZ:HYQMV edema  Imaging: No results found.  Labs: BMET  Lab 09/16/11 0610 09/15/11 0420 09/14/11 1135 09/13/11 1612  NA 133* 136 131* 137  K 2.8* 3.8 3.9 3.5  CL 94* 92* 89* 96  CO2 27 28 23 27   GLUCOSE 80 123* 107* 155*  BUN 57* 63* 66* 51*  CREATININE 9.51* 9.33* 9.10* 8.96*  ALB -- -- -- --  CALCIUM 9.5 10.1 9.9 9.8  PHOS -- -- -- --   CBC  Lab 09/16/11 0610 09/15/11 0420 09/14/11 1127 09/13/11 1612  WBC 6.2 8.4 9.5 9.0  NEUTROABS -- -- -- 7.8*  HGB 7.9* 9.2* 8.8* 6.5*  HCT 24.1* 27.9* 26.7* 20.2*  MCV 94.9 93.9 92.4 96.2  PLT 144* 169 200 223    Medications:      . carvedilol  6.25 mg Oral BID WC  . dialysis solution for CAPD/CCPD (DELFLEX)   Peritoneal Dialysis Once in dialysis  . doxazosin  2 mg Oral QHS  . insulin aspart  0-15 Units Subcutaneous TID WC  . pantoprazole  40 mg Oral BID AC  . ranolazine  500 mg Oral Daily  . sevelamer  3,200 mg Oral TID WC  . sodium chloride  3 mL Intravenous Q12H

## 2011-09-16 NOTE — Progress Notes (Signed)
   CARE MANAGEMENT NOTE 09/16/2011  Patient:  [name], [name]   Account Number:  [id number]  Date Initiated:  09/15/2011  Documentation initiated by:  [facility]  Subjective/Objective Assessment:   Nausea/vomiting, hematemesis, DM, HTN, anemia     Action/Plan:   Anticipated DC Date:     Anticipated DC Plan:        DC Planning Services  CM consult      Choice offered to / List presented to:             Status of service:  In process, will continue to follow Medicare Important Message given?   (If response is "NO", the following Medicare IM given date fields will be blank) Date Medicare IM given:   Date Additional Medicare IM given:    Discharge Disposition:    Per UR Regulation:    If discussed at Long Length of Stay Meetings, dates discussed:    Comments:  09/16/2011 1530 Faxed Preference to Transfer Form to [facility]. Pt has refused transfer to [place] at this time. Wants to remain at [facility]. Faxed request for medical records to [facility]. Isidoro Donning RN CCM Case Mgmt phone [phone]  09/16/2011 0900 Spoke to pt and she does not want to transfer to [facility]. Contacted  [place] to request paperwork. Left message on vm for Alejandro Mulling, Transfer Coordinator. Isidoro Donning RN CCM Case Mgmt phone [phone]  09/15/2011 1545 Pt goes to [place]. Spoke to attending MD and [facility] does not have Gastroenterology. Pt has not decided on if she wants transfer. Contacted [facility] and will have transfer papers faxed to [facility]. Isidoro Donning RN CCM Case Mgmt phone [phone]

## 2011-09-16 NOTE — Progress Notes (Signed)
Patient ID: [name], female   DOB: [birth year], 61 y.o.   MRN: [id number] PGY-1 Daily Progress Note Family Medicine Teaching Service Mitsue Peery M. Byanca Kasper, MD Service Pager: [phone]  Subjective: Patient continues to have pain. She states her epigastric pain has improved but she has pain secondary to PD. No nausea since last night. Tolerated clears diet. Last BM >24 hours ago.   Objective: Vital signs in last 24 hours: Temp:  [96.8 F (36 C)-97.8 F (36.6 C)] 97.8 F (36.6 C) (04/02 2154) Pulse Rate:  [72-85] 85  (04/02 2154) Resp:  [10-19] 17  (04/02 2154) BP: (104-147)/(55-95) 128/77 mmHg (04/02 2154) SpO2:  [91 %-100 %] 98 % (04/02 2154) FiO2 (%):  [2 %] 2 % (04/02 1718) Weight:  [132 lb 6.4 oz (60.056 kg)] 132 lb 6.4 oz (60.056 kg) (04/02 2154) Weight change: -8 lb 0.5 oz (-3.644 kg) Last BM Date: 09/14/11  Intake/Output from previous day: 04/02 0701 - 04/03 0700 In: 500 [P.O.:240; I.V.:260] Out: -  Intake/Output this shift:   General appearance: alert, talkative, NAD Head: Normocephalic, without obvious abnormality, atraumatic Resp: clear to auscultation bilaterally Cardio: regular rate and rhythm GI: Midline tenderness. PD cath in place LLQ Extremities: extremities normal, atraumatic, no cyanosis or edema Neurologic: Grossly normal  Lab Results:  [facility] 09/16/11 0610 09/15/11 0420  WBC 6.2 8.4  HGB 7.9* 9.2*  HCT 24.1* 27.9*  PLT 144* 169   BMET  Basename 09/16/11 0610 09/15/11 0420  NA 133* 136  K 2.8* 3.8  CL 94* 92*  CO2 27 28  GLUCOSE 80 123*  BUN 57* 63*  CREATININE 9.51* 9.33*  CALCIUM 9.5 10.1    Studies/Results: No results found. Medications:  I have reviewed the patient's current medications. Scheduled:    . carvedilol  6.25 mg Oral BID WC  . dextrose      . dialysis solution for CAPD/CCPD [facility])   Peritoneal Dialysis Once in dialysis  . doxazosin  2 mg Oral QHS  . insulin aspart  0-15 Units Subcutaneous TID WC  .  pantoprazole  40 mg Oral BID AC  . ranolazine  500 mg Oral Daily  . sevelamer  3,200 mg Oral TID WC  . sodium chloride  3 mL Intravenous Q12H  . DISCONTD: pantoprazole  40 mg Oral Q1200   Continuous:    . sodium chloride 20 mL/hr at 09/16/11 0700  . dialysis solution 2.5% low-MG/low-CA 5,000 mL (09/15/11 2231)   JYN:WGNFAOZHYQMVH (DILAUDID) injection, ondansetron (ZOFRAN) IV, DISCONTD: fentaNYL, DISCONTD: lidocaine, DISCONTD: midazolam  Assessment/Plan: This is a 61 year old African-American female with a history of chronic pancreatitis, nausea/vomiting for the past year, and ESRD on home peritoneal dialysis who presents with worsening nausea/vomiting.   1.Pancreatic pseudocyst--the patient was recently hospitalized for N/V and hematemesis at the [facility] from 03/05-03/19. CT-abd/pelvis 01/2011 (most recent one done at [facility]) showed pancreas with two peripancreatic fluid collections (likely pseudocysts) along body/tail with mild peripancreatic stranding consistent with mild pancreatitis. History of chronic pancreatitis since 2003 attributed to gallstones. Improved following cholecystectomy but has been having symptoms (abdomina pain/nausea/vomiting) consistent with previous pancreatitis for the past year. Patient told recent hospitalization she has pancreatic pseudocysts. Repeat CT scan done today shows enlarging of pseudocyst with hemorrhagic process. Lipase elevated at 147 (in the past 56mo ago, 260-270). However, patient afebrile and WBC WNL. LFTs not elevated. Patient admitted with hematemesis, developed rectal bleeding yesterday. Since then, no vomiting and decreased rectal bleeding - Continue Dilaudid 0.5 q4hrs prn pain.  -  Continue Zofran prn for nausea - Surgery consulted on patient and we appreciate their input. - GI consulted and did EGD yesterday which revealed gastritis and duodenal bulb. Biopsies pending. Will continue to follow-up on GI recs. - Patient's family member is bringing  EGD records from [place] today.  2. Anemia--she has a longstanding history of anemia for several years. Previously, her anemia was attributed to heavy periods, however, her anemia has persisted after menopause. She receives periodic blood transfusions. The cause of her anemia is unknown. She has seen hematologists in the past. She thinks her chronic renal disease is the cause of her anemia. Her baseline hemoglobin is around 8. She receives Aranesp treatments weekly. During her recent hospitalization, she saw a GI specialist who performed an EGD. The only significant finding per patient was a hiatal hernia. Anemia on admission likely 2/2 to GI bleed. Patient is asymptomatic from her anemia (no tachycardia, BP stable) which is not surprising given her long standing history.  - Hgb 6.5 on arrival. S/p 2U PRBC, HgB 9.2 yesterday, 7.9 this morning. - Continue to hold home ASA - Recheck H/H this afternoon and continue to trend HgB  3. History of ESRD on peritoneal dialysis--secondary to poorly controlled hypertension and diabetes secondary to medical non-compliance. Started peritoneal dialysis about 1 year ago. Patient makes urine (about 1/2 cup daily).  - Dialysis every day. Electrolytes okay currently on admission. - Renal service following, and we appreciate their help with this patient's care. She is getting daily PD. - Home medications: renolazine, sevelamer which are being continued at this time.   4. Cardiology--patient reports history of HTN but blood pressures not atypically 100s systolics.  - History of severe chronic systolic heart failure--05/2011 TEE: EF 5-10%. Followed by heart failure cardiologist in [facility]. She was recently hospitalized 05/2011 for acute on chronic heart failure secondary to blood transfusion. She denies any symptoms at this time during blood transfusion, but she knows her body well enough to let us know if she has any changes - History of CAD s/p CABG 2008 with multiple PCI    - History of ischemic cardiomyopathy--although patient denies history of heart attack.  - History of hyperlipidemia  - Fluid restriction. Monitor for signs of fluid overload during transfusion.  - Home medications: Coreg 25 bid, doxazosin 4 qhs, hydralazine 25 bid/Imdur, pravastatin 20>>>hold hydralazine/Imdur, pravstatin. Continue doxazosin, Coreg at reduced dose.  - If patient becomes symptomatic in any way, will not delay a cardiology consult given her multiple comorbidities  5. T2DM--recent HgbA1c between 5-6. She uses insulin intermittently. It had been poorly controlled in the past.  - Home medications: Lantus 10 prn. - Continue SSI and monitor CBG. Pt had an episode of hypoglycemia yesterday while NPO. Will continue to closely monitor.  6. FEN/GI  -IVF: NS @ KVO.  -Diet: Clear liquids  7. PPx:  -DVT PPx: SCD (no heparin 2/2 anemia and GI bleed)  -SUP: Protonix   Dispo: Pending overall clinical improvement  CODE: DNR/DNI   LOS: 3 days   Cecilia Vancleve 09/16/2011, 7:55 AM

## 2011-09-16 NOTE — Progress Notes (Signed)
   CARE MANAGEMENT NOTE 09/16/2011  Patient:  [name], [name]   Account Number:  [id number]  Date Initiated:  09/15/2011  Documentation initiated by:  [facility]  Subjective/Objective Assessment:   Nausea/vomiting, hematemesis, DM, HTN, anemia     Action/Plan:   Anticipated DC Date:     Anticipated DC Plan:        DC Planning Services  CM consult      Choice offered to / List presented to:             Status of service:  In process, will continue to follow Medicare Important Message given?   (If response is "NO", the following Medicare IM given date fields will be blank) Date Medicare IM given:   Date Additional Medicare IM given:    Discharge Disposition:    Per UR Regulation:    If discussed at Long Length of Stay Meetings, dates discussed:    Comments:  09/16/2011 0900 Spoke to pt and she does not want to transfer to [facility]. Contacted [facility] [place] to request paperwork. Left message on vm for Alejandro Mulling, Transfer Coordinator. Isidoro Donning RN CCM Case Mgmt phone [phone]  09/15/2011 1545 Pt goes to [place]. Spoke to attending MD and [facility] does not have Gastroenterology. Pt has not decided on if she wants transfer. Contacted [facility] and will have transfer papers faxed to [facility]. Isidoro Donning RN CCM Case Mgmt phone [phone]

## 2011-09-16 NOTE — Progress Notes (Signed)
Patient ID: [name], female   DOB: 11-07-1950, 61 y.o.   MRN: [id number] 1 Day Post-Op  Subjective: Pt feels ok.  Still with some nausea, but no hematemesis.  No further bloody BMs.   Objective: Vital signs in last 24 hours: Temp:  [96.8 F (36 C)-97.8 F (36.6 C)] 97.8 F (36.6 C) (04/02 2154) Pulse Rate:  [72-85] 85  (04/02 2154) Resp:  [10-19] 17  (04/02 2154) BP: (104-147)/(55-95) 128/77 mmHg (04/02 2154) SpO2:  [91 %-100 %] 98 % (04/02 2154) FiO2 (%):  [2 %] 2 % (04/02 1718) Weight:  [132 lb 6.4 oz (60.056 kg)] 132 lb 6.4 oz (60.056 kg) (04/02 2154) Last BM Date: 09/14/11  Intake/Output from previous day: 04/02 0701 - 04/03 0700 In: 500 [P.O.:240; I.V.:260] Out: -  Intake/Output this shift:    PE: Abd: soft, still a little tender, +BS, ND  Lab Results:   Basename 09/16/11 0610 09/15/11 0420  WBC 6.2 8.4  HGB 7.9* 9.2*  HCT 24.1* 27.9*  PLT 144* 169   BMET  Basename 09/16/11 0610 09/15/11 0420  NA 133* 136  K 2.8* 3.8  CL 94* 92*  CO2 27 28  GLUCOSE 80 123*  BUN 57* 63*  CREATININE 9.51* 9.33*  CALCIUM 9.5 10.1   PT/INR  Basename 09/14/11 1135  LABPROT 16.2*  INR 1.27   CMP     Component Value Date/Time   NA 133* 09/16/2011 0610   K 2.8* 09/16/2011 0610   CL 94* 09/16/2011 0610   CO2 27 09/16/2011 0610   GLUCOSE 80 09/16/2011 0610   BUN 57* 09/16/2011 0610   CREATININE 9.51* 09/16/2011 0610   CALCIUM 9.5 09/16/2011 0610   PROT 5.7* 09/15/2011 0420   ALBUMIN 2.7* 09/15/2011 0420   AST 12 09/15/2011 0420   ALT 11 09/15/2011 0420   ALKPHOS 55 09/15/2011 0420   BILITOT 0.3 09/15/2011 0420   GFRNONAA 4* 09/16/2011 0610   GFRAA 4* 09/16/2011 0610   Lipase     Component Value Date/Time   LIPASE 147* 09/13/2011 1612       Studies/Results: No results found.  Anti-infectives: Anti-infectives    None       Assessment/Plan  1. N/V likely secondary to partial outlet obstruction (see EGD report) 2. Chronic pseudocyst, causing mass effect on duodenum as seen  on CT scan report 4. UGI bleed, stable  Plan: 1. Patient had a slight drop in hgb from yesterday, but has had no further bleeding from top or bottom.  Could simply be somewhat dilutional after transfusion.  Would follow. 2. Patient was aware that she has this area in the duodenal bulb as they told her this at the [place] apparently when they did her EGD a few weeks ago.  This report is still not available.  I have asked the patient to ask her daughter to bring in a copy of this report.  Currently, biopsies are pending from yesterday's EGD.  [facility]not a [facility], it was inflammation." On CT scan, the pseudocyst is showing a mass-effect on the duodenum and likely what is being seen on her EGD is the compression of the pseudocyst on the intestines.  Her stomach is not dilated on her scan and she should be able to at least tolerate clear liquids.  Ideally, the pseudocyst will shrink over time; however, if it does not then there are several different procedures that can be done in order to drain the cyst.  Some are endoscopic drainage or surgical drainage.    The patient has made it relatively clear that she doesn't want these things done at least right now because that puts her PD cath in jeopardy of getting infected and removed and she does not want to do HD.  Once again, the [place] is already aware of this issue and is/was treating the patient.  We do not have records yet and therefore, do not have a good idea of just how much information they already have about what's truly going on with this patient.  It would still be beneficial for the patient to get her care from one place, where she is known best.  If she can be transferred back to the [place] for further care that would be the best option for the patient's best interest.  3. Continue clear liquids for now, given chronic partial obstruction secondary to pseudocyst.  Agree with above.  It sounds like the endoscopy finding are consistent  with the CT scan findings.  She'll need supplement in liquid form to keep her nutrition up in that she may have trouble with solid foods.  It would be interesting to see what the CT scan she had at the [facility] showed.  I discussed this with the patient.  She seems to understand.  DN   LOS: 3 days    OSBORNE,KELLY E 09/16/2011  Ovidio Kin, MD, [facility] Surgery Pager: [phone] Office phone:  [phone]

## 2011-09-17 ENCOUNTER — Inpatient Hospital Stay (HOSPITAL_COMMUNITY): Payer: Non-veteran care

## 2011-09-17 LAB — CBC
MCH: 30.8 pg (ref 26.0–34.0)
MCHC: 32.7 g/dL (ref 30.0–36.0)
Platelets: 163 10*3/uL (ref 150–400)

## 2011-09-17 LAB — GLUCOSE, CAPILLARY
Glucose-Capillary: 99 mg/dL (ref 70–99)
Glucose-Capillary: 103 mg/dL — ABNORMAL HIGH (ref 70–99)

## 2011-09-17 LAB — RENAL FUNCTION PANEL
Calcium: 9.7 mg/dL (ref 8.4–10.5)
GFR calc Af Amer: 5 mL/min — ABNORMAL LOW (ref >90)
GFR calc non Af Amer: 4 mL/min — ABNORMAL LOW (ref >90)
Phosphorus: 5.9 mg/dL — ABNORMAL HIGH (ref 2.3–4.6)
Sodium: 136 mEq/L (ref 135–145)

## 2011-09-17 MED ORDER — DELFLEX-LC/1.5% DEXTROSE 346 MOSM/L IP SOLN: INTRAPERITONEAL | Status: DC

## 2011-09-17 MED ORDER — DELFLEX-LC/1.5% DEXTROSE 346 MOSM/L IP SOLN
INTRAPERITONEAL | Status: DC
  Administered 2011-09-17: 10 L via INTRAPERITONEAL

## 2011-09-17 MED ORDER — DARBEPOETIN ALFA-POLYSORBATE 150 MCG/0.3ML IJ SOLN
150.0000 ug | INTRAMUSCULAR | Status: DC
  Administered 2011-09-18: 150 ug via SUBCUTANEOUS
  Filled 2011-09-17 (×2): qty 0.3

## 2011-09-17 MED ORDER — BOOST / RESOURCE BREEZE PO LIQD
1.0000 | Freq: Two times a day (BID) | ORAL | Status: DC
  Administered 2011-09-17 – 2011-09-20 (×6): 1 via ORAL

## 2011-09-17 MED ORDER — SEVELAMER CARBONATE 800 MG PO TABS
3200.0000 mg | ORAL_TABLET | Freq: Three times a day (TID) | ORAL | Status: DC
  Filled 2011-09-17 (×13): qty 4

## 2011-09-17 MED ORDER — DELFLEX-LC/1.5% DEXTROSE 346 MOSM/L IP SOLN: Freq: Once | INTRAPERITONEAL | Status: DC

## 2011-09-17 MED ORDER — POTASSIUM CHLORIDE CRYS ER 20 MEQ PO TBCR
40.0000 meq | EXTENDED_RELEASE_TABLET | Freq: Four times a day (QID) | ORAL | Status: AC
  Administered 2011-09-17: 40 meq via ORAL
  Filled 2011-09-17 (×2): qty 2

## 2011-09-17 NOTE — Progress Notes (Signed)
Patient ID: [name], female   DOB: [birth year], 61 y.o.   MRN: [id number] PGY-1 Daily Progress Note Family Medicine Teaching Service [name] M. Dorothy Leazer, MD Service Pager: [phone]  Subjective: Patient continues to do well. Only complaint of pain is in lower abdomen. No signs of bleeding.   Objective: Vital signs in last 24 hours: Temp:  [97.2 F (36.2 C)-97.9 F (36.6 C)] 97.2 F (36.2 C) (04/04 0636) Pulse Rate:  [80-92] 92  (04/04 0636) Resp:  [20] 20  (04/04 0636) BP: (119-136)/(76-87) 136/87 mmHg (04/04 0636) SpO2:  [94 %-100 %] 94 % (04/04 0636) Weight:  [132 lb 7.9 oz (60.1 kg)-132 lb 11.5 oz (60.2 kg)] 132 lb 7.9 oz (60.1 kg) (04/04 0957) Weight change: 5.1 oz (0.144 kg) Last BM Date: 09/16/11  Intake/Output from previous day: 04/03 0701 - 04/04 0700 In: 720 [P.O.:240; I.V.:480] Out: -  Intake/Output this shift:   General appearance: alert, talkative, NAD Head: Normocephalic, without obvious abnormality, atraumatic Resp: clear to auscultation bilaterally Cardio: regular rate and rhythm GI: Epigastric and lower midline tenderness. PD cath in place LLQ Extremities: extremities normal, atraumatic, no cyanosis or edema Neurologic: Grossly normal  Lab Results:  [facility] 09/17/11 0620 09/16/11 2104 09/16/11 0610  WBC 7.3 -- 6.2  HGB 9.3* 8.8* --  HCT 28.4* 27.2* --  PLT 163 -- 144*   BMET  Basename 09/17/11 0620 09/16/11 0610  NA 136 133*  K 3.2* 2.8*  CL 96 94*  CO2 23 27  GLUCOSE 118* 80  BUN 45* 57*  CREATININE 8.90* 9.51*  CALCIUM 9.7 9.5    Studies/Results: No results found. Medications:  I have reviewed the patient's current medications. Scheduled:    . carvedilol  6.25 mg Oral BID WC  . dialysis solution for CAPD/CCPD [facility])   Peritoneal Dialysis Once in dialysis  . doxazosin  2 mg Oral QHS  . insulin aspart  0-15 Units Subcutaneous TID WC  . pantoprazole  40 mg Oral BID AC  . potassium chloride  40 mEq Oral BID  . ranolazine  500  mg Oral Daily  . sevelamer  3,200 mg Oral TID WC  . sodium chloride  3 mL Intravenous Q12H  . DISCONTD: sevelamer  3,200 mg Oral TID WC   Continuous:    . sodium chloride 20 mL/hr at 09/17/11 0700  . dialysis solution 2.5% low-MG/low-CA 5,000 mL (09/15/11 2231)   JYN:WGNFAOZHYQMVH (DILAUDID) injection, ondansetron (ZOFRAN) IV  Assessment/Plan: This is a 61 year old African-American female with a history of chronic pancreatitis, nausea/vomiting for the past year, and ESRD on home peritoneal dialysis who presents with worsening nausea/vomiting.   1.Pancreatic pseudocyst--the patient was recently hospitalized for N/V and hematemesis at the [facility] from 03/05-03/19. CT-abd/pelvis 01/2011 (most recent one done at [facility]) showed pancreas with two peripancreatic fluid collections (likely pseudocysts) along body/tail with mild peripancreatic stranding consistent with mild pancreatitis. History of chronic pancreatitis since 2003 attributed to gallstones. Improved following cholecystectomy but has been having symptoms (abdomina pain/nausea/vomiting) consistent with previous pancreatitis for the past year. Patient told recent hospitalization she has pancreatic pseudocysts. Repeat CT scan done today shows enlarging of pseudocyst with hemorrhagic process. Lipase elevated at 147 (in the past 68mo ago, 260-270). However, patient afebrile and WBC WNL. LFTs not elevated. Patient admitted with hematemesis, developed rectal bleeding yesterday. Since then, no vomiting and decreased rectal bleeding - Continue Dilaudid 0.5 q4hrs prn pain.  - Continue Zofran prn for nausea - Surgery consulted on patient and we appreciate their input.  Surgery signed off. - GI consulted and did EGD yesterday which revealed gastritis and duodenal bulb. We appreciate GI's assistance in the care of this patient. Biopsies pending. Awaiting UGI series today to evaluate gastric outlet obstruction.. [facility]  2. Anemia--she has a  longstanding history of anemia for several years. Previously, her anemia was attributed to heavy periods, however, her anemia has persisted after menopause. She receives periodic blood transfusions. The cause of her anemia is unknown. She has seen hematologists in the past. She thinks her chronic renal disease is the cause of her anemia. Her baseline hemoglobin is around 8. She receives Aranesp treatments weekly. During her recent hospitalization, she saw a GI specialist who performed an EGD. The only significant finding per patient was a hiatal hernia. Anemia on admission likely 2/2 to GI bleed. Patient is asymptomatic from her anemia (no tachycardia, BP stable) which is not surprising given her long standing history.  - Hgb 6.5 on arrival. S/p 2U PRBC, HgB 9.3 this morning. No s/sx of bleed at this time. - Continue to hold home ASA  3. History of ESRD on peritoneal dialysis--secondary to poorly controlled hypertension and diabetes secondary to medical non-compliance. Started peritoneal dialysis about 1 year ago. Patient makes urine (about 1/2 cup daily).  - Dialysis every day. Electrolytes okay currently on admission. - Renal service following, and we appreciate their help with this patient's care. She is getting daily PD. - Home medications: renolazine, sevelamer which are being continued at this time.   4. Cardiology--patient reports history of HTN but blood pressures not atypically 100s systolics.  - History of severe chronic systolic heart failure--05/2011 TEE: EF 5-10%. Followed by heart failure cardiologist in [facility]. She was recently hospitalized 05/2011 for acute on chronic heart failure secondary to blood transfusion. She denies any symptoms at this time during blood transfusion, but she knows her body well enough to let us know if she has any changes - History of CAD s/p CABG 2008 with multiple PCI  - History of ischemic cardiomyopathy--although patient denies history of heart attack.  -  History of hyperlipidemia  - Fluid restriction. Monitor for signs of fluid overload during transfusion.  - Home medications: Coreg 25 bid, doxazosin 4 qhs, hydralazine 25 bid/Imdur, pravastatin 20>>>hold hydralazine/Imdur, pravstatin. Continue doxazosin, Coreg at reduced dose.  - If patient becomes symptomatic in any way, will not delay a cardiology consult given her multiple comorbidities  5. T2DM--recent HgbA1c between 5-6. She uses insulin intermittently. It had been poorly controlled in the past.  - Home medications: Lantus 10 prn. - Continue SSI and monitor CBG. Pt had an episode of hypoglycemia yesterday while NPO. Will continue to closely monitor.  6. FEN/GI  -IVF: NS @ KVO.  -Diet: Clear liquids  7. PPx:  -DVT PPx: SCD (no heparin 2/2 anemia and GI bleed)  -SUP: Protonix   Dispo: Pending overall clinical improvement.  CODE: DNR/DNI   LOS: 4 days   [name] 09/17/2011, 10:24 AM

## 2011-09-17 NOTE — Progress Notes (Signed)
Patient ID: [name], female   DOB: [birth year], 61 y.o.   MRN: [id number] Subjective: Patient still with epigastric abdominal pain.  Objective: Vital signs in last 24 hours: Temp:  [97.2 F (36.2 C)-98.3 F (36.8 C)] 97.2 F (36.2 C) (04/04 0636) Pulse Rate:  [74-92] 92  (04/04 0636) Resp:  [20] 20  (04/04 0636) BP: (119-136)/(76-87) 136/87 mmHg (04/04 0636) SpO2:  [94 %-100 %] 94 % (04/04 0636) Weight:  [60.2 kg (132 lb 11.5 oz)] 60.2 kg (132 lb 11.5 oz) (04/03 2147) Last BM Date: 09/16/11  Intake/Output from previous day: 04/03 0701 - 04/04 0700 In: 720 [P.O.:240; I.V.:480] Out: -  Intake/Output this shift:    General appearance: alert and no distress GI: tender in the epigastrium  Lab Results:  Basename 09/17/11 0620 09/16/11 2104 09/16/11 0610 09/15/11 0420  WBC 7.3 -- 6.2 8.4  HGB 9.3* 8.8* 7.9* --  HCT 28.4* 27.2* 24.1* --  PLT 163 -- 144* 169   BMET  Basename 09/17/11 0620 09/16/11 0610 09/15/11 0420  NA 136 133* 136  K 3.2* 2.8* 3.8  CL 96 94* 92*  CO2 23 27 28   GLUCOSE 118* 80 123*  BUN 45* 57* 63*  CREATININE 8.90* 9.51* 9.33*  CALCIUM 9.7 9.5 10.1   LFT  Basename 09/17/11 0620 09/15/11 0420  PROT -- 5.7*  ALBUMIN 2.4* --  AST -- 12  ALT -- 11  ALKPHOS -- 55  BILITOT -- 0.3  BILIDIR -- --  IBILI -- --   PT/INR  Basename 09/14/11 1135  LABPROT 16.2*  INR 1.27   Hepatitis Panel No results found for this basename: HEPBSAG,HCVAB,HEPAIGM,HEPBIGM in the last 72 hours C-Diff No results found for this basename: CDIFFTOX:3 in the last 72 hours Fecal Lactopherrin No results found for this basename: FECLLACTOFRN in the last 72 hours  Studies/Results: No results found.  Medications:  Scheduled:   . carvedilol  6.25 mg Oral BID WC  . dialysis solution for CAPD/CCPD [facility])   Peritoneal Dialysis Once in dialysis  . doxazosin  2 mg Oral QHS  . insulin aspart  0-15 Units Subcutaneous TID WC  . pantoprazole  40 mg Oral BID AC  .  potassium chloride  40 mEq Oral BID  . ranolazine  500 mg Oral Daily  . sevelamer  3,200 mg Oral TID WC  . sodium chloride  3 mL Intravenous Q12H  . DISCONTD: sevelamer  3,200 mg Oral TID WC   Continuous:   . sodium chloride 20 mL/hr at 09/17/11 0700  . dialysis solution 2.5% low-MG/low-CA 5,000 mL (09/15/11 2231)    Assessment/Plan: 1) Pancreatic pseudocyst with possible extrinsic compression. 2) Antral gastritis. 3) Duodenal lesion - ? Etiology.   The EGD biopsy results are still pending.  I will perform an UGI series to see if she has a gastric outlet obstruction.  It is suggested by the EGD and CT scan.  Plan: 1) UGI series. 2) Await biopsy results.  LOS: 4 days   [name] D 09/17/2011, 9:26 AM

## 2011-09-17 NOTE — Progress Notes (Signed)
Patient ID: [name], female   DOB: Aug 13, 1950, 61 y.o.   MRN: [id number] 2 Days Post-Op   Subjective: Pt c/o some nausea, but states that crackers make this better as opposed to medicine.  She would like more to eat than just clear liquids.  Objective: Vital signs in last 24 hours: Temp:  [97.2 F (36.2 C)-98.3 F (36.8 C)] 97.2 F (36.2 C) (04/04 0636) Pulse Rate:  [74-92] 92  (04/04 0636) Resp:  [20] 20  (04/04 0636) BP: (119-136)/(76-87) 136/87 mmHg (04/04 0636) SpO2:  [94 %-100 %] 94 % (04/04 0636) Weight:  [132 lb 11.5 oz (60.2 kg)] 132 lb 11.5 oz (60.2 kg) (04/03 2147) Last BM Date: 09/16/11  Intake/Output from previous day: 04/03 0701 - 04/04 0700 In: 700 [P.O.:240; I.V.:460] Out: -  Intake/Output this shift:    PE: Abd: soft, minimally tender, peritoneal dialysis currently running.  Lab Results:   Basename 09/17/11 0620 09/16/11 2104 09/16/11 0610  WBC 7.3 -- 6.2  HGB 9.3* 8.8* --  HCT 28.4* 27.2* --  PLT 163 -- 144*   BMET  Basename 09/16/11 0610 09/15/11 0420  NA 133* 136  K 2.8* 3.8  CL 94* 92*  CO2 27 28  GLUCOSE 80 123*  BUN 57* 63*  CREATININE 9.51* 9.33*  CALCIUM 9.5 10.1   PT/INR  Basename 09/14/11 1135  LABPROT 16.2*  INR 1.27   CMP     Component Value Date/Time   NA 133* 09/16/2011 0610   K 2.8* 09/16/2011 0610   CL 94* 09/16/2011 0610   CO2 27 09/16/2011 0610   GLUCOSE 80 09/16/2011 0610   BUN 57* 09/16/2011 0610   CREATININE 9.51* 09/16/2011 0610   CALCIUM 9.5 09/16/2011 0610   PROT 5.7* 09/15/2011 0420   ALBUMIN 2.7* 09/15/2011 0420   AST 12 09/15/2011 0420   ALT 11 09/15/2011 0420   ALKPHOS 55 09/15/2011 0420   BILITOT 0.3 09/15/2011 0420   GFRNONAA 4* 09/16/2011 0610   GFRAA 4* 09/16/2011 0610   Lipase     Component Value Date/Time   LIPASE 147* 09/13/2011 1612       Studies/Results: No results found.  Anti-infectives: Anti-infectives    None       Assessment/Plan  1. Duodenal mass, ? Mass effect from pseudocyst.  bx taken at  [facility], unsure what their results were.  bx taken here and are currently pending.  EGD report from 3 weeks ago present in the patient's ghost chart.   Plan: 1. I would consider allowing the patient to try some full liquids.  She may not advance much past this as she may not be able to tolerate a regular diet, given her partial obstruction.  2. It appears off of her EGD report from the [place] that they wanted to set up an EUS under anesthesia soon to further evaluate this area in the duodenal bulb. 3. No further surgical recommendations at this time.  Please call us back as needed.   LOS: 4 days    OSBORNE,KELLY E 09/17/2011  Agree with above.  Ovidio Kin, MD, [facility] Pager: [phone] Office phone:  [phone]

## 2011-09-17 NOTE — Progress Notes (Signed)
Pt arrived back from the Upper Gi procedure. Pt fell downstairs. Pt skin is intact, neuro check completed. Pt said she's just a little sore.  Will continue to monitor.

## 2011-09-17 NOTE — Progress Notes (Signed)
Nutrition Follow-up  Surgery saw pt on 4/1, recommending GI consult to determine source of oozing/bleeding. EGD completed 4/2 with findings of mild gastritis, antral thickening, and duodenal bulb mass/lesion/inflammation. Biopsy of duodenal mass taken, awaiting results. Per surgical PA; suspect pt may not advance beyond full liquids given her partial obstructions. Pt states she is willing to tray [employer] supplement.  Diet Order:  Clear Liquids  Meds: Scheduled Meds:   . carvedilol  6.25 mg Oral BID WC  . dialysis solution for CAPD/CCPD [facility])   Peritoneal Dialysis Once in dialysis  . doxazosin  2 mg Oral QHS  . insulin aspart  0-15 Units Subcutaneous TID WC  . pantoprazole  40 mg Oral BID AC  . potassium chloride  40 mEq Oral BID  . ranolazine  500 mg Oral Daily  . sevelamer  3,200 mg Oral TID WC  . sodium chloride  3 mL Intravenous Q12H  . DISCONTD: sevelamer  3,200 mg Oral TID WC   Continuous Infusions:   . sodium chloride 20 mL/hr at 09/17/11 0700  . dialysis solution 2.5% low-MG/low-CA 5,000 mL (09/15/11 2231)   PRN Meds:.HYDROmorphone (DILAUDID) injection, ondansetron (ZOFRAN) IV  Labs:  CMP     Component Value Date/Time   NA 136 09/17/2011 0620   K 3.2* 09/17/2011 0620   CL 96 09/17/2011 0620   CO2 23 09/17/2011 0620   GLUCOSE 118* 09/17/2011 0620   BUN 45* 09/17/2011 0620   CREATININE 8.90* 09/17/2011 0620   CALCIUM 9.7 09/17/2011 0620   PROT 5.7* 09/15/2011 0420   ALBUMIN 2.4* 09/17/2011 0620   AST 12 09/15/2011 0420   ALT 11 09/15/2011 0420   ALKPHOS 55 09/15/2011 0420   BILITOT 0.3 09/15/2011 0420   GFRNONAA 4* 09/17/2011 0620   GFRAA 5* 09/17/2011 0620  Phosphorus 5.9H   Intake/Output Summary (Last 24 hours) at 09/17/11 0854 Last data filed at 09/17/11 0700  Gross per 24 hour  Intake    720 ml  Output      0 ml  Net    720 ml    Weight Status:  60.2 kg, wt up ~1 kg x 3 days  Estimated needs:  1770 - 2050 kcal, 75 - 85 grams protein  Nutrition Dx:  Inadequate oral  intake r/t GI distress and poor appetite AEB 27% wt loss in 9 months, and <75% of estimated energy intake requirement x at least 1 month.  Goal:  Pt to advance to diet as tolerated. Progressing. Intake to meet > 90% of estimated needs. Unmet.  Intervention:  Resource Breeze PO BID for additional protein and kcal.  Monitor:  Weights, labs, I/O's, PO intake, diet advancement  Adair Laundry Pager #:  [phone]

## 2011-09-17 NOTE — Progress Notes (Signed)
I discussed with Dr Hairford.  I agree with their plans documented in their progress note for today.  

## 2011-09-17 NOTE — Progress Notes (Addendum)
Called [name] medical records at [phone] ext [phone]. I was transferred to [name] at [phone] ext [phone]. Records will be sent today (within 30 minutes). Requested records include procedure notes, discharge summaries, radiology reports and medication list. Requested records be sent to [phone] fax [phone]. Attn Dr. Armen Pickup.  [name] 9:32 AM 09/17/11

## 2011-09-17 NOTE — Progress Notes (Signed)
Spoke with Dr.Funches about pt having a fall downstairs in radiology. I told her that Radiology called me and told me about her loosing balance when transferring from the wheelchair to the table. Radiology tech said that the Radiologist assessed the pt and her skin intact and she's ok. They said that she fell straight down and just hit the pedal of the wheelchair. Pt still not back from procedure but Dr.Funches is aware.

## 2011-09-17 NOTE — Progress Notes (Signed)
[facility]  Subjective: Currently in radiology finishing up UGI.  Still complains of abd pain--especially in RLQ.  Appendix nl on 31 March 13 CT scan   In addition, she slumped to floor when going from wheelchair to [facility] table   Objective: Vital signs in last 24 hours: Blood pressure 123/77, pulse 88, temperature 98 F (36.7 C), temperature source Oral, resp. rate 20, height 5\' 4"  (1.626 m), weight 60.1 kg (132 lb 7.9 oz), SpO2 97.00%.  I did not check orthostatic BP--she was on fluoro table in XRay    PHYSICAL EXAM General--awake, alert.  VS noted above Chest--clear Heart--no rub Abd--tender in RLQ, no rebound; PD cath in  L rectus Extr--no edema  Lab Results:   Lab 09/17/11 0620 09/16/11 0610 09/15/11 0420  NA 136 133* 136  K 3.2* 2.8* 3.8  CL 96 94* 92*  CO2 23 27 28   BUN 45* 57* 63*  CREATININE 8.90* 9.51* 9.33*  ALB -- -- --  GLUCOSE 118* -- --  CALCIUM 9.7 9.5 10.1  PHOS 5.9* -- --     Basename 09/17/11 0620 09/16/11 2104 09/16/11 0610  WBC 7.3 -- 6.2  HGB 9.3* 8.8* --  HCT 28.4* 27.2* --  PLT 163 -- 144*       Assessment/Plan:  1.  Abd pain in pt on PD with pancreatic pseudocyst 2.  Anemia.  Fe/TIBC 38%, ferritin 821 on 13 Sep 2011 3.  ESRD on PD 4.  High BP (not high now plus she had symptomatic orthostasis) 5.  Hypokalemia  PLAN  1.  Per Dr. [name] and primary service.  No evidence of gastric obstruction on UGI 2.  Begin Aranesp 150 SQ/ week 3.  Continue PD--use 1.5% dextrose today and tonight with BP being low 4.  Hold BP meds--spoke to Dr. Mikel Cella 5.  Replace with PO KCl     LOS: 4 days   Tyller Bowlby F 09/17/2011,3:17 PM   .labalb

## 2011-09-18 LAB — BASIC METABOLIC PANEL
BUN: 39 mg/dL — ABNORMAL HIGH (ref 6–23)
Chloride: 94 mEq/L — ABNORMAL LOW (ref 96–112)
GFR calc Af Amer: 5 mL/min — ABNORMAL LOW (ref >90)
GFR calc non Af Amer: 5 mL/min — ABNORMAL LOW (ref >90)
Potassium: 3.7 mEq/L (ref 3.5–5.1)
Sodium: 132 mEq/L — ABNORMAL LOW (ref 135–145)

## 2011-09-18 LAB — CBC
HCT: 28.1 % — ABNORMAL LOW (ref 36.0–46.0)
MCHC: 32.4 g/dL (ref 30.0–36.0)
RDW: 21.5 % — ABNORMAL HIGH (ref 11.5–15.5)
WBC: 6.3 10*3/uL (ref 4.0–10.5)

## 2011-09-18 LAB — GLUCOSE, CAPILLARY: Glucose-Capillary: 131 mg/dL — ABNORMAL HIGH (ref 70–99)

## 2011-09-18 MED ORDER — FENTANYL 25 MCG/HR TD PT72
25.0000 ug | MEDICATED_PATCH | TRANSDERMAL | Status: DC
  Administered 2011-09-18: 25 ug via TRANSDERMAL
  Filled 2011-09-18: qty 1

## 2011-09-18 MED ORDER — GLUCAGON HCL (RDNA) 1 MG IJ SOLR
INTRAMUSCULAR | Status: AC
  Filled 2011-09-18: qty 1

## 2011-09-18 MED ORDER — ONDANSETRON HCL 8 MG PO TABS
8.0000 mg | ORAL_TABLET | Freq: Three times a day (TID) | ORAL | Status: DC
  Administered 2011-09-18 – 2011-09-20 (×5): 8 mg via ORAL
  Filled 2011-09-18 (×10): qty 1

## 2011-09-18 MED ORDER — ONDANSETRON HCL 8 MG PO TABS
8.0000 mg | ORAL_TABLET | Freq: Three times a day (TID) | ORAL | Status: DC
  Filled 2011-09-18 (×5): qty 1

## 2011-09-18 MED ORDER — DELFLEX-LM/2.5% DEXTROSE 396 MOSM/L IP SOLN
Freq: Once | INTRAPERITONEAL | Status: AC
  Administered 2011-09-18: 20:00:00 via INTRAPERITONEAL

## 2011-09-18 MED ORDER — ONDANSETRON HCL 4 MG/2ML IJ SOLN
4.0000 mg | Freq: Four times a day (QID) | INTRAMUSCULAR | Status: DC
  Administered 2011-09-18: 4 mg via INTRAVENOUS
  Filled 2011-09-18: qty 2

## 2011-09-18 NOTE — Progress Notes (Signed)
Patient ID: [name], female   DOB: 1951/03/04, 61 y.o.   MRN: [id number] PGY-1 Daily Progress Note Family Medicine Teaching Service Ramona Slinger M. Dontae Minerva, MD Service Pager: [phone]  Subjective: Patient complaining of pain this morning, both abdominal as well as pain on bottom from fall yesterday. No nausea, requesting increased diet.  Objective: Vital signs in last 24 hours: Temp:  [97.9 F (36.6 C)-98.5 F (36.9 C)] 97.9 F (36.6 C) (04/05 1323) Pulse Rate:  [86-95] 94  (04/05 1323) Resp:  [17-18] 17  (04/05 1323) BP: (115-143)/(77-97) 117/81 mmHg (04/05 1323) SpO2:  [93 %-100 %] 99 % (04/05 1323) Weight change: -3.5 oz (-0.1 kg) Last BM Date: 09/16/11  Intake/Output from previous day: 04/04 0701 - 04/05 0700 In: 640 [P.O.:480; I.V.:160] Out: -  Intake/Output this shift: Total I/O In: 243 [P.O.:240; I.V.:3] Out: -  General appearance: alert, talkative, NAD Head: Normocephalic, without obvious abnormality, atraumatic Resp: clear to auscultation bilaterally Cardio: regular rate and rhythm GI: Epigastric and lower midline tenderness. PD cath in place LLQ. Appears more tender on my exam Extremities: extremities normal, atraumatic, no cyanosis or edema Neurologic: Grossly normal  Lab Results:  Basename 09/18/11 0555 09/17/11 0620  WBC 6.3 7.3  HGB 9.1* 9.3*  HCT 28.1* 28.4*  PLT 172 163   BMET  Basename 09/18/11 0555 09/17/11 0620  NA 132* 136  K 3.7 3.2*  CL 94* 96  CO2 24 23  GLUCOSE 135* 118*  BUN 39* 45*  CREATININE 8.33* 8.90*  CALCIUM 9.7 9.7    Studies/Results: Dg Ugi W/small Bowel  09/17/2011  *RADIOLOGY REPORT*  Clinical Data:Abdominal pain.  Chronic pancreatitis.  Hemorrhagic pseudocyst.  UPPER GI W/ SMALL BOWEL  Technique: Upper GI series performed with high density barium and effervescent agent. Thin barium also used.  Subsequently, serial images of the small bowel were obtained including spot views of the terminal ileum.  Fluoroscopy Time: 5.37  minutes slow pulsed reduced dose fluoroscopy  Comparison: CT scan of the abdomen dated 09/13/2011  Findings: The mucosa and motility of the esophagus are normal.  No hiatal hernia or gastroesophageal reflux.  The patient has edema of the mucosa of the body of the stomach.  There is a 4 cm indentation upon the lesser curvature of the gastric antrum but the previously demonstrated hemorrhagic pseudocyst.  However, there is free flow of barium around the pseudocyst mass effect into the distal antrum and through the pylorus into the duodenal C-loop.  Small bowel transit time was approximately 2 hours and 10 minutes. The small bowel is normal including the terminal ileum.  The appendix fills and is normal.  IMPRESSION: The patient has an extrinsic mass effect upon the antrum of the stomach due to the hemorrhagic pseudocyst demonstrated on the prior CT scan.  However, there is no obstruction of flow of barium into the duodenum and small bowel.  Small bowel is normal.  There is edema of the mucosa of the stomach.  This could be due to hyperacidity.  Original Report Authenticated By: Gwynn Burly, M.D.   Medications:  I have reviewed the patient's current medications. Scheduled:    . darbepoetin (ARANESP) injection - NON-DIALYSIS  150 mcg Subcutaneous Q Thu-1800  . dialysis solution 2.5% low-MG   Intraperitoneal Once in dialysis  . doxazosin  2 mg Oral QHS  . feeding supplement  1 Container Oral BID BM  . fentaNYL  25 mcg Transdermal Q72H  . insulin aspart  0-15 Units Subcutaneous TID WC  .  ondansetron (ZOFRAN) IV  4 mg Intravenous Q6H  . pantoprazole  40 mg Oral BID AC  . potassium chloride  40 mEq Oral Q6H  . ranolazine  500 mg Oral Daily  . sevelamer  3,200 mg Oral TID WC  . sodium chloride  3 mL Intravenous Q12H  . DISCONTD: carvedilol  6.25 mg Oral BID WC  . DISCONTD: dialysis solution for CAPD/CCPD [facility])   Peritoneal Dialysis Once in dialysis  . DISCONTD: dialysis solution 1.5%  low-MG/low-CA   Intraperitoneal Once in dialysis   Continuous:    . sodium chloride 20 mL/hr at 09/18/11 0300  . DISCONTD: dialysis solution 1.5% low-MG/low-CA    . DISCONTD: dialysis solution 1.5% low-MG/low-CA 10 L (09/17/11 2200)  . DISCONTD: dialysis solution 2.5% low-MG/low-CA 5,000 mL (09/15/11 2231)   HYQ:MVHQIONGEXBMW (DILAUDID) injection, DISCONTD: ondansetron (ZOFRAN) IV  Assessment/Plan: This is a 61 year old African-American female with a history of chronic pancreatitis, nausea/vomiting for the past year, and ESRD on home peritoneal dialysis who presents with worsening nausea/vomiting.   1.Pancreatic pseudocyst--the patient was recently hospitalized for N/V and hematemesis at the [facility] from 03/05-03/19. CT-abd/pelvis 01/2011 (most recent one done at [facility]) showed pancreas with two peripancreatic fluid collections (likely pseudocysts) along body/tail with mild peripancreatic stranding consistent with mild pancreatitis. History of chronic pancreatitis since 2003 attributed to gallstones. Improved following cholecystectomy but has been having symptoms (abdomina pain/nausea/vomiting) consistent with previous pancreatitis for the past year. Patient told recent hospitalization she has pancreatic pseudocysts. Repeat CT scan done today shows enlarging of pseudocyst with hemorrhagic process. Lipase elevated at 147 (in the past 29mo ago, 260-270). However, patient afebrile and WBC WNL. LFTs not elevated. Patient admitted with hematemesis, developed rectal bleeding yesterday. Since then, no vomiting and decreased rectal bleeding - Continue Dilaudid 0.5 q4hrs prn pain. Had discussion with patient about other pain medications. States she only likes Dilaudid when in the hospital but it "doesn't work". She refuses morphine dosed more frequently as well as all PO options I gave her. I think PO Dilaudid and/or Fentanyl patch is a good choice for her. Will discuss with team - Continue Zofran prn for  nausea - Surgery consulted on patient and we appreciate their input. Surgery signed off. - GI consulted and did EGD which revealed gastritis and duodenal bulb. We appreciate GI's assistance in the care of this patient. UGI series did not reveal obstruction. Bx results do not show malignancy. Will await GI recs.  2. Anemia--she has a longstanding history of anemia for several years. Previously, her anemia was attributed to heavy periods, however, her anemia has persisted after menopause. She receives periodic blood transfusions. The cause of her anemia is unknown. She has seen hematologists in the past. She thinks her chronic renal disease is the cause of her anemia. Her baseline hemoglobin is around 8. She receives Aranesp treatments weekly. During her recent hospitalization, she saw a GI specialist who performed an EGD. The only significant finding per patient was a hiatal hernia. Anemia on admission likely 2/2 to GI bleed. Patient is asymptomatic from her anemia (no tachycardia, BP stable) which is not surprising given her long standing history.  - Hgb 6.5 on arrival. S/p 2U PRBC, HgB 9.1 this morning which is stable. No s/sx of bleed at this time. - Continue to hold home ASA  3. History of ESRD on peritoneal dialysis--secondary to poorly controlled hypertension and diabetes secondary to medical non-compliance. Started peritoneal dialysis about 1 year ago. Patient makes urine (about 1/2 cup daily).  -  Dialysis every day. Electrolytes okay currently on admission. - Renal service following, and we appreciate their help with this patient's care. She is getting daily PD. - Patient orthostatic in radiology yesterday and had a fall. Renal to adjust PD for potentially having too much fluid taken off. - Home medications: renolazine, sevelamer which are being continued at this time.   4. Cardiology--patient reports history of HTN but blood pressures not atypically 100s systolics.  - History of severe chronic  systolic heart failure--05/2011 TEE: EF 5-10%. Followed by heart failure cardiologist in [facility]. She was recently hospitalized 05/2011 for acute on chronic heart failure secondary to blood transfusion. She denies any symptoms at this time during blood transfusion, but she knows her body well enough to let us know if she has any changes - History of CAD s/p CABG 2008 with multiple PCI  - History of ischemic cardiomyopathy--although patient denies history of heart attack.  - History of hyperlipidemia  - Fluid restriction. Monitor for signs of fluid overload during transfusion.  - Home medications: Coreg 25 bid, doxazosin 4 qhs, hydralazine 25 bid/Imdur, pravastatin 20>>>hold hydralazine/Imdur, pravstatin. All home medications held due to orthostasis yesterday. - If patient becomes symptomatic in any way, will not delay a cardiology consult given her multiple comorbidities  5. T2DM--recent HgbA1c between 5-6. She uses insulin intermittently. It had been poorly controlled in the past.  - Home medications: Lantus 10 prn. - Continue SSI and monitor CBG. Pt had an episode of hypoglycemia yesterday while NPO. Will continue to closely monitor.  6. S/p fall- Patient had fall secondary to likely orthostatic hypotension yesterday. - No open wound or bruising. Patient complains of pain on her "tail bone" - If pain does not improve, will consider CT scan to evaluate for fracture  7. FEN/GI  -IVF: NS @ KVO.  -Diet: Full liquids  8. PPx:  -DVT PPx: SCD (no heparin 2/2 anemia and GI bleed)  -SUP: Protonix   Dispo: Pending overall clinical improvement and GI recs.  CODE: DNR/DNI   LOS: 5 days   Jordynn Perrier 09/18/2011, 2:14 PM

## 2011-09-18 NOTE — Progress Notes (Signed)
At this time I think she can be discharged home with pain medications.  She is able to tolerate liquids.  I think a second opinion from [facility] is in order to see if she can have a cyst gastrostomy as I believe she is having symptoms from her pseudocyst.  I will start to make the arrangements on Monday.

## 2011-09-18 NOTE — Progress Notes (Signed)
[facility] Progress Note  Assessment/Plan:  1. Abd pain in pt on PD with pancreatic pseudocyst  2. Anemia. Fe/TIBC 38%, ferritin 821 on 13 Sep 2011  3. ESRD on PD (-200cc) 4. High BP (not high now plus she had symptomatic orthostasis)  5. Hypokalemia, improved  PLAN  1. Per Dr. [name] and primary service. No evidence of gastric obstruction on UGI  2. Begin Aranesp 150 SQ/ week  3. Continue PD--use 1.5% dextrose today and tonight with BP being low     Subjective:   No new issues   Objective:   BP 143/97  Pulse 95  Temp(Src) 98.5 F (36.9 C) (Oral)  Resp 17  Ht 5\' 4"  (1.626 m)  Wt 60.1 kg (132 lb 7.9 oz)  BMI 22.74 kg/m2  SpO2 99%  Intake/Output Summary (Last 24 hours) at 09/18/11 1123 Last data filed at 09/18/11 1054  Gross per 24 hour  Intake    403 ml  Output      0 ml  Net    403 ml   Weight change: -0.1 kg (-3.5 oz)  Physical Exam: ZOX:[id number] in no obvious distress CVS:RRR Resp:clear JWJ:XBJY Ext:tr edema  Imaging: Dg Ugi W/small Bowel  09/17/2011  *RADIOLOGY REPORT*  Clinical Data:Abdominal pain.  Chronic pancreatitis.  Hemorrhagic pseudocyst.  UPPER GI W/ SMALL BOWEL  Technique: Upper GI series performed with high density barium and effervescent agent. Thin barium also used.  Subsequently, serial images of the small bowel were obtained including spot views of the terminal ileum.  Fluoroscopy Time: 5.37 minutes slow pulsed reduced dose fluoroscopy  Comparison: CT scan of the abdomen dated 09/13/2011  Findings: The mucosa and motility of the esophagus are normal.  No hiatal hernia or gastroesophageal reflux.  The patient has edema of the mucosa of the body of the stomach.  There is a 4 cm indentation upon the lesser curvature of the gastric antrum but the previously demonstrated hemorrhagic pseudocyst.  However, there is free flow of barium around the pseudocyst mass effect into the distal antrum and through the pylorus into the duodenal C-loop.   Small bowel transit time was approximately 2 hours and 10 minutes. The small bowel is normal including the terminal ileum.  The appendix fills and is normal.  IMPRESSION: The patient has an extrinsic mass effect upon the antrum of the stomach due to the hemorrhagic pseudocyst demonstrated on the prior CT scan.  However, there is no obstruction of flow of barium into the duodenum and small bowel.  Small bowel is normal.  There is edema of the mucosa of the stomach.  This could be due to hyperacidity.  Original Report Authenticated By: Gwynn Burly, M.D.    Labs: BMET  Lab 09/18/11 7829 09/17/11 5621 09/16/11 0610 09/15/11 0420 09/14/11 1135 09/13/11 1612  NA 132* 136 133* 136 131* 137  K 3.7 3.2* 2.8* 3.8 3.9 3.5  CL 94* 96 94* 92* 89* 96  CO2 24 23 27 28 23 27   GLUCOSE 135* 118* 80 123* 107* 155*  BUN 39* 45* 57* 63* 66* 51*  CREATININE 8.33* 8.90* 9.51* 9.33* 9.10* 8.96*  ALB -- -- -- -- -- --  CALCIUM 9.7 9.7 9.5 10.1 9.9 9.8  PHOS -- 5.9* -- -- -- --   CBC  Lab 09/18/11 0555 09/17/11 0620 09/16/11 2104 09/16/11 0610 09/15/11 0420 09/13/11 1612  WBC 6.3 7.3 -- 6.2 8.4 --  NEUTROABS -- -- -- -- -- 7.8*  HGB 9.1* 9.3* 8.8* 7.9* -- --  HCT 28.1* 28.4* 27.2* 24.1* -- --  MCV 95.3 94.0 -- 94.9 93.9 --  PLT 172 163 -- 144* 169 --    Medications:      . darbepoetin (ARANESP) injection - NON-DIALYSIS  150 mcg Subcutaneous Q Thu-1800  . doxazosin  2 mg Oral QHS  . feeding supplement  1 Container Oral BID BM  . insulin aspart  0-15 Units Subcutaneous TID WC  . pantoprazole  40 mg Oral BID AC  . potassium chloride  40 mEq Oral Q6H  . ranolazine  500 mg Oral Daily  . sevelamer  3,200 mg Oral TID WC  . sodium chloride  3 mL Intravenous Q12H  . DISCONTD: carvedilol  6.25 mg Oral BID WC  . DISCONTD: dialysis solution for CAPD/CCPD [facility])   Peritoneal Dialysis Once in dialysis  . DISCONTD: dialysis solution 1.5% low-MG/low-CA   Intraperitoneal Once in dialysis

## 2011-09-18 NOTE — Progress Notes (Signed)
I have seen and examined this patient. I have discussed with Dr Mikel Cella.  I agree with their findings and plans as documented in their progress note for today.  Acute Issues 1. Chronic Pancreatitis with pseudocyst - Begin transition of IV dilaudid to long-acting opiate.  Start with Fentanyl Patch 25 mcg every 3 days, gradually increase based on short-acting dilaudid use.  Consider switching AS NEEDED IV dilaudid to AS NEEDED ORAL dilaudid.  Scheduled Zofran for persistent nausea.  F/U GI recommendations. 2. Fall during transfer [facility] to Xray table.  Reported as orthostatic-related event. Will decrease antihypertensive medication doses.  Would titrate off doxazosin, if additional reduction of antihypertensive medications necessary based on orthostatic BP measurements, then would decrease hydralazine/long-acting nitrate dose next.

## 2011-09-18 NOTE — Progress Notes (Signed)
Patient ID: [name], female   DOB: 10-12-1950, 61 y.o.   MRN: [id number]  Subjective: Since I last evaluated the patient - she looks better overall but still has epigastric abdominal pain along with RLQ pain- has no hemetemesis or vomiting. Still reports having blood in stool. EGD shows Duodenal metaplasia and reactive gastropathy.   Objective: Vital signs in last 24 hours: Temp:  [97.9 F (36.6 C)-98.5 F (36.9 C)] 97.9 F (36.6 C) (04/05 1323) Pulse Rate:  [86-95] 94  (04/05 1323) Resp:  [17-18] 17  (04/05 1323) BP: (115-143)/(77-97) 117/81 mmHg (04/05 1323) SpO2:  [93 %-100 %] 99 % (04/05 1323) Last BM Date: 09/18/11  Intake/Output from previous day: 04/04 0701 - 04/05 0700 In: 640 [P.O.:480; I.V.:160] Out: -  Intake/Output this shift: Total I/O In: 243 [P.O.:240; I.V.:3] Out: -   General: resting in bed HEENT: PERRL, EOMI, no scleral icterus Cardiac: RRR, no rubs, murmurs or gallops Pulm: clear to auscultation bilaterally, moving normal volumes of air Abd: soft, mild tenderness to palpation in epigastric region, nondistended, BS present Ext: warm and well perfused, no pedal edema Neuro: alert and oriented X3   Lab Results:  Basename 09/18/11 0555 09/17/11 0620 09/16/11 2104 09/16/11 0610  WBC 6.3 7.3 -- 6.2  HGB 9.1* 9.3* 8.8* --  HCT 28.1* 28.4* 27.2* --  PLT 172 163 -- 144*   BMET  Basename 09/18/11 0555 09/17/11 0620 09/16/11 0610  NA 132* 136 133*  K 3.7 3.2* 2.8*  CL 94* 96 94*  CO2 24 23 27   GLUCOSE 135* 118* 80  BUN 39* 45* 57*  CREATININE 8.33* 8.90* 9.51*  CALCIUM 9.7 9.7 9.5   LFT  Basename 09/17/11 0620  PROT --  ALBUMIN 2.4*  AST --  ALT --  ALKPHOS --  BILITOT --  BILIDIR --  IBILI --    Studies/Results: Dg Ugi W/small Bowel  09/17/2011  *RADIOLOGY REPORT*  Clinical Data:Abdominal pain.  Chronic pancreatitis.  Hemorrhagic pseudocyst.  UPPER GI W/ SMALL BOWEL  Technique: Upper GI series performed with high density barium and  effervescent agent. Thin barium also used.  Subsequently, serial images of the small bowel were obtained including spot views of the terminal ileum.  Fluoroscopy Time: 5.37 minutes slow pulsed reduced dose fluoroscopy  Comparison: CT scan of the abdomen dated 09/13/2011  Findings: The mucosa and motility of the esophagus are normal.  No hiatal hernia or gastroesophageal reflux.  The patient has edema of the mucosa of the body of the stomach.  There is a 4 cm indentation upon the lesser curvature of the gastric antrum but the previously demonstrated hemorrhagic pseudocyst.  However, there is free flow of barium around the pseudocyst mass effect into the distal antrum and through the pylorus into the duodenal C-loop.  Small bowel transit time was approximately 2 hours and 10 minutes. The small bowel is normal including the terminal ileum.  The appendix fills and is normal.  IMPRESSION: The patient has an extrinsic mass effect upon the antrum of the stomach due to the hemorrhagic pseudocyst demonstrated on the prior CT scan.  However, there is no obstruction of flow of barium into the duodenum and small bowel.  Small bowel is normal.  There is edema of the mucosa of the stomach.  This could be due to hyperacidity.  Original Report Authenticated By: Gwynn Burly, M.D.    Medications:  Scheduled:    . darbepoetin (ARANESP) injection - NON-DIALYSIS  150 mcg Subcutaneous Q Thu-1800  .  dialysis solution 2.5% low-MG   Intraperitoneal Once in dialysis  . doxazosin  2 mg Oral QHS  . feeding supplement  1 Container Oral BID BM  . fentaNYL  25 mcg Transdermal Q72H  . insulin aspart  0-15 Units Subcutaneous TID WC  . ondansetron  8 mg Oral Q8H  . ondansetron  8 mg Oral Q8H  . pantoprazole  40 mg Oral BID AC  . potassium chloride  40 mEq Oral Q6H  . ranolazine  500 mg Oral Daily  . sevelamer  3,200 mg Oral TID WC  . sodium chloride  3 mL Intravenous Q12H  . DISCONTD: carvedilol  6.25 mg Oral BID WC  .  DISCONTD: dialysis solution for CAPD/CCPD [facility])   Peritoneal Dialysis Once in dialysis  . DISCONTD: dialysis solution 1.5% low-MG/low-CA   Intraperitoneal Once in dialysis  . DISCONTD: ondansetron (ZOFRAN) IV  4 mg Intravenous Q6H   Continuous:    . sodium chloride 20 mL/hr at 09/18/11 0300  . DISCONTD: dialysis solution 1.5% low-MG/low-CA    . DISCONTD: dialysis solution 1.5% low-MG/low-CA 10 L (09/17/11 2200)  . DISCONTD: dialysis solution 2.5% low-MG/low-CA 5,000 mL (09/15/11 2231)    Assessment/Plan: 1) Pancreatic pseudocyst with possible extrinsic compression. 2) Antral gastritis.    The EGD biopsy results shows Duodenal metaplasia and reactive gastropathy- but no carcinoma/dysplasia or H.Pylori.  Still has epigastric pain- although not worse.  Plan: 1) Transfer to [facility] center for Gastric drainage of the Pseudocyst due to persistent pain vs. DC home on pain meds and arrange out pt follow up at tertiary center with possible pseudocyst drainage. Dr. [name] will discuss with pt today.   LOS: 5 days   Jaycee Mckellips 09/18/2011, 3:33 PM

## 2011-09-18 NOTE — Discharge Summary (Signed)
Physician Discharge Summary  Patient ID: [name] MRN: [id number] DOB/AGE: Oct 31, 1950 61 y.o.  Admit date: 09/13/2011 Discharge date: 09/20/2011  Admission Diagnoses: Hematemesis  Discharge Diagnoses:  Principal Problem:  *Pancreatic pseudocyst Active Problems:  Chronic systolic congestive heart failure  Hypertension  End stage renal disease   Discharged Condition: stable  Hospital Course: This is a 61 year old African-American female with a history of chronic pancreatitis, nausea/vomiting for the past year, and ESRD on home peritoneal dialysis who presents with worsening nausea/vomiting.   1.Pancreatic pseudocyst--the patient was recently hospitalized for N/V and hematemesis at the [facility] from 03/05-03/19. CT-abd/pelvis 01/2011 (most recent one done at [facility]) showed pancreas with two peripancreatic fluid collections (likely pseudocysts) along body/tail with mild peripancreatic stranding consistent with mild pancreatitis. History of chronic pancreatitis since 2003 attributed to gallstones. Improved following cholecystectomy but has been having symptoms (abdominal pain/nausea/vomiting) consistent with previous pancreatitis for the past year. Patient told recent hospitalization she has pancreatic pseudocyst. Repeat CT scan done on admission shows enlarging of pseudocyst with hemorrhagic process. Lipase elevated at 147 (in the past 75mo ago, 260-270). However, patient afebrile and WBC WNL. LFTs not elevated. Patient admitted with hematemesis, developed rectal bleeding on hospital day #1. She received 2U PRBC and had no further episodes of hematemesis or BRBPR after hospital day #2. She had epigastric pain which was relatively well controlled with Dilaudid. Patient transitioned to Fentanyl patch which worked well for basal control. Continue Zofran for nausea at discharge as well. Surgery consulted on patient given the pancreatic pseudocyst as well as GI bleed. There was nothing from a surgical  perspective that could be addressed while patient was here. (She does not want to stop PD and any procedure has inherent risks that patient does not feel comfortable with at this time.) GI consulted as well. EGD revealed gastritis and duodenal bulb, which was known from EGD in [place]. Biopsies were benign. Patient also had an UGI series which did not reveal obstruction. From GI perspective, Dr. [name] thinks patient could benefit from a cyst gastrostomy at a tertiary care center. Patient discharged home with medications for pain and close follow up with Dr. [name] as well as her PCP.  2. Anemia--she has a longstanding history of anemia for several years. Previously, her anemia was attributed to heavy periods, however, her anemia has persisted after menopause. She receives periodic blood transfusions. The cause of her anemia is unknown. She has seen hematologists in the past. She thinks her chronic renal disease is the cause of her anemia. Her baseline hemoglobin is around 8. She receives Aranesp treatments weekly. During her recent hospitalization, she saw a GI specialist who performed an EGD. The only significant finding per patient was a hiatal hernia. Anemia on admission likely 2/2 to GI bleed. Patient is asymptomatic from her anemia (no tachycardia, BP stable) which is not surprising given her long standing history. Hgb 6.5 on arrival to hospital. S/p 2U PRBC, HgB 9.1 stable at baseline. No s/sx of bleed at this time.   3. History of ESRD on peritoneal dialysis--secondary to poorly controlled hypertension and diabetes secondary to medical non-compliance. Started peritoneal dialysis about 1 year ago. Patient makes urine (about 1/2 cup daily).  Dialysis every day. Electrolytes okay currently on admission.  Renal service following, and we appreciate their help with this patient's care. Continued to get daily PD. Patient orthostatic in radiology during admission and had a fall. Renal to adjust PD for potentially  having too much fluid taken off.  4. Cardiology--patient reports history of HTN but blood pressures not atypically 100s systolics.  History of severe chronic systolic heart failure--05/2011 TEE: EF 5-10%. Followed by heart failure cardiologist in [facility]. She was recently hospitalized 05/2011 for acute on chronic heart failure secondary to blood transfusion. She denies any symptoms at this time during blood transfusion, but she knows her body well enough to let us know if she has any changes. History of CAD s/p CABG 2008 with multiple PCI. History of ischemic cardiomyopathy--although patient denies history of heart attack.  History of hyperlipidemia. We continued fluid restriction and monitored for signs of fluid overload during admission. Home medications restarted at lower doses which she did well on. Will follow up at [facility] for further management of chronic cardiology problems, but was stable during admission..   5. T2DM--recent HgbA1c between 5-6. She uses insulin intermittently. It had been poorly controlled in the past.  Home medications: Lantus 10 prn. Continue SSI and monitor CBG. Pt had an episode of hypoglycemia yesterday while NPO. .   6. S/p fall- Patient had fall secondary to likely orthostatic hypotension yesterday.  No open wound or bruising. Patient complains of pain on her "tail bone". Antihypertensives held temporarily and PD modified. Patient asymptomatic on day of discharge   Consults: GI and nephrology  Significant Diagnostic Studies:  Results for Dorothy, [name] (MRN [id number]) as of 09/22/2011 11:33  09/13/2011 16:12 09/14/2011 11:27 09/15/2011 04:20 09/16/2011 06:10 09/16/2011 21:04 09/17/2011 06:20 09/18/2011 05:55 09/19/2011 07:30 09/20/2011 05:32  HGB 6.5 (LL) 8.8 (L) 9.2 (L) 7.9 (L) 8.8 (L) 9.3 (L) 9.1 (L) 9.1 (L) 9.1 (L)   Ct Abdomen Pelvis W Contrast  09/13/2011  *RADIOLOGY REPORT*  Clinical Data: Chronic pancreatitis.  Abdominal pain.  CT ABDOMEN AND PELVIS WITH CONTRAST   Technique:  Multidetector CT imaging of the abdomen and pelvis was performed following the standard protocol during bolus administration of intravenous contrast.  Contrast: OMNIPAQUE IOHEXOL 300 MG/ML IJ SOLN  Comparison: CT scan 02/02/2011.  Findings: The heart is enlarged but stable.  There are small bilateral pleural effusions, right greater than left with overlying small infiltrates.  The liver is unremarkable.  No focal lesions or intrahepatic biliary dilatation.  The gallbladder surgically absent.  No common bile duct dilatation.  The spleen is normal in size.  No focal lesions.  The adrenal glands and kidneys are stable.  There are bilateral renal cysts.  On the prior CT scan there is a small cystic area near the pancreatic body which was present be a pancreatic pseudocyst.  This has enlarged significantly since the prior study demonstrates high attenuation.  It is most likely a pancreatic pseudocyst with subsequent hemorrhage or less likely infection.  There is significant mass effect on the posterior wall of the stomach.  This measures a total of 6.3 x 3.5 cm.  No CT findings for acute pancreatitis.   Cystic changes noted in the pancreatic tail.  The stomach, duodenum, the small bowel and colon are unremarkable. The appendix is normal.  A peritoneal dialysis catheter is noted in the pelvis with a small amount of free abdominal and pelvic fluid. The bladder is normal.  The uterus is surgically absent.  No pelvic mass or adenopathy.  The bony structures are intact.  IMPRESSION:  1.  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  MRI of the abdomen without and with contrast may be helpful  for further evaluation. 2.  Cystic changes in the pancreatic tail. 3.  Small bilateral pleural effusions with small overlying infiltrate. 4.  Stable cardiac enlargement. 5.  Peritoneal dialysis catheter in the pelvis  with moderate free abdominal and pelvic fluid.  Original Report Authenticated By: P. Loralie Champagne, M.D.   Dg Abd Acute W/chest  09/13/2011  *RADIOLOGY REPORT*  Clinical Data: Nausea and vomiting.  ACUTE ABDOMEN SERIES (ABDOMEN 2 VIEW & CHEST 1 VIEW)  Comparison: CT scan 02/02/2011 and chest x-ray 06/02/2011.  Findings: The upright chest x-ray demonstrates pacer wires / AICD in good position without complicating features.  There are stable surgical changes from triple bypass surgery.  The heart is enlarged but unchanged.  Mild vascular congestion but no pulmonary edema or pleural effusion.  Two views of the abdomen demonstrate scattered air and stool throughout the colon and down to the rectum.  No distended small bowel loops to suggest obstruction.  No free air.  A peritoneal dialysis catheter is noted in the pelvis.  Stable surgical changes. The bony structures are intact.  IMPRESSION:  1.  No acute cardiopulmonary findings.  Stable cardiac enlargement. 2.  No plain film findings for an acute abdominal process.  Original Report Authenticated By: P. Loralie Champagne, M.D.   Varney Biles Kayleen Memos W/small Bowel  09/17/2011  *RADIOLOGY REPORT*  Clinical Data:Abdominal pain.  Chronic pancreatitis.  Hemorrhagic pseudocyst.  UPPER GI W/ SMALL BOWEL  Technique: Upper GI series performed with high density barium and effervescent agent. Thin barium also used.  Subsequently, serial images of the small bowel were obtained including spot views of the terminal ileum.  Fluoroscopy Time: 5.37 minutes slow pulsed reduced dose fluoroscopy  Comparison: CT scan of the abdomen dated 09/13/2011  Findings: The mucosa and motility of the esophagus are normal.  No hiatal hernia or gastroesophageal reflux.  The patient has edema of the mucosa of the body of the stomach.  There is a 4 cm indentation upon the lesser curvature of the gastric antrum but the previously demonstrated hemorrhagic pseudocyst.  However, there is free flow of barium around the  pseudocyst mass effect into the distal antrum and through the pylorus into the duodenal C-loop.  Small bowel transit time was approximately 2 hours and 10 minutes. The small bowel is normal including the terminal ileum.  The appendix fills and is normal.  IMPRESSION: The patient has an extrinsic mass effect upon the antrum of the stomach due to the hemorrhagic pseudocyst demonstrated on the prior CT scan.  However, there is no obstruction of flow of barium into the duodenum and small bowel.  Small bowel is normal.  There is edema of the mucosa of the stomach.  This could be due to hyperacidity.  Original Report Authenticated By: Gwynn Burly, M.D.   Treatments: EGD  Discharge Exam: Blood pressure 114/74, pulse 94, temperature 97.6 F (36.4 C), temperature source Oral, resp. rate 17, height 5\' 4"  (1.626 m), weight 132 lb 7.9 oz (60.1 kg), SpO2 100.00%. General appearance: alert, talkative, NAD  Head: Normocephalic, without obvious abnormality, atraumatic  Resp: clear to auscultation bilaterally  Cardio: regular rate and rhythm  GI: mild epigastric and lower midline tenderness without rebound or guarding. PD cath in place LLQ.  Extremities: extremities normal, atraumatic, no cyanosis or edema  Neurologic: Grossly normal   Disposition: 01-Home or Self Care  Discharge Orders    Future Orders Please Complete By Expires   Increase activity slowly      Discharge instructions  Comments:   Please continue a liquid diet as tolerated.  You can drink an ensure three times a day.   Call MD for:  temperature >100.4      Call MD for:  persistant nausea and vomiting      Call MD for:  severe uncontrolled pain      Call MD for:  redness, tenderness, or signs of infection (pain, swelling, redness, odor or green/yellow discharge around incision site)      Call MD for:  difficulty breathing, headache or visual disturbances      Call MD for:  persistant dizziness or light-headedness      Call MD  for:  extreme fatigue      (HEART FAILURE PATIENTS) Call MD:  Anytime you have any of the following symptoms: 1) 3 pound weight gain in 24 hours or 5 pounds in 1 week 2) shortness of breath, with or without a dry hacking cough 3) swelling in the hands, feet or stomach 4) if you have to sleep on extra pillows at night in order to breathe.        Medication List  As of 09/22/2011 11:30 AM   STOP taking these medications         aspirin EC 81 MG tablet      hydrALAZINE 25 MG tablet      isosorbide mononitrate 30 MG 24 hr tablet         TAKE these medications         carvedilol 6.25 MG tablet   Commonly known as: COREG   Take 1 tablet (6.25 mg total) by mouth 2 (two) times daily with a meal.      DAILY VITAMIN PO   Take by mouth daily.      doxazosin 2 MG tablet   Commonly known as: CARDURA   Take 1 tablet (2 mg total) by mouth at bedtime.      fentaNYL 50 MCG/HR   Commonly known as: DURAGESIC - dosed mcg/hr   Place 1 patch (50 mcg total) onto the skin every 3 (three) days.      HYDROmorphone 2 MG tablet   Commonly known as: DILAUDID   Take 1 tablet (2 mg total) by mouth every 3 (three) hours as needed.      insulin glargine 100 UNIT/ML injection   Commonly known as: LANTUS   Inject 10 Units into the skin daily as needed. Per sliding scale      pravastatin 20 MG tablet   Commonly known as: PRAVACHOL   Take 20 mg by mouth daily.      ranolazine 500 MG 12 hr tablet   Commonly known as: RANEXA   Take 500 mg by mouth daily.      RENAGEL 800 MG tablet   Generic drug: sevelamer   Take 3,200 mg by mouth 3 (three) times daily with meals.           Follow-up Information    Schedule an appointment as soon as possible for a visit to follow up. (Your primary care doctor at the [place])       Schedule an appointment as soon as possible for a visit to follow up. [facility])          Signed: Mikel Cella, Clayborn Milnes 09/22/2011, 11:30 AM

## 2011-09-19 LAB — RENAL FUNCTION PANEL
Calcium: 9.7 mg/dL (ref 8.4–10.5)
GFR calc Af Amer: 6 mL/min — ABNORMAL LOW (ref >90)
Glucose, Bld: 192 mg/dL — ABNORMAL HIGH (ref 70–99)
Phosphorus: 5.4 mg/dL — ABNORMAL HIGH (ref 2.3–4.6)
Sodium: 133 mEq/L — ABNORMAL LOW (ref 135–145)

## 2011-09-19 LAB — GLUCOSE, CAPILLARY

## 2011-09-19 LAB — CBC
HCT: 28 % — ABNORMAL LOW (ref 36.0–46.0)
MCHC: 32.5 g/dL (ref 30.0–36.0)
RDW: 20.3 % — ABNORMAL HIGH (ref 11.5–15.5)

## 2011-09-19 MED ORDER — CARVEDILOL 6.25 MG PO TABS
6.2500 mg | ORAL_TABLET | Freq: Two times a day (BID) | ORAL | Status: DC
  Administered 2011-09-19 – 2011-09-20 (×2): 6.25 mg via ORAL
  Filled 2011-09-19 (×4): qty 1

## 2011-09-19 MED ORDER — TRAMADOL HCL 50 MG PO TABS: 50.0000 mg | ORAL_TABLET | Freq: Four times a day (QID) | ORAL | Status: DC | PRN

## 2011-09-19 MED ORDER — FENTANYL 50 MCG/HR TD PT72: 50.0000 ug | MEDICATED_PATCH | TRANSDERMAL | Status: DC

## 2011-09-19 MED ORDER — TRAMADOL HCL 50 MG PO TABS
50.0000 mg | ORAL_TABLET | Freq: Four times a day (QID) | ORAL | Status: DC | PRN
  Administered 2011-09-19: 50 mg via ORAL
  Filled 2011-09-19: qty 1

## 2011-09-19 MED ORDER — HYDROMORPHONE HCL 2 MG PO TABS
2.0000 mg | ORAL_TABLET | ORAL | Status: DC | PRN
  Administered 2011-09-19 – 2011-09-20 (×3): 2 mg via ORAL
  Filled 2011-09-19 (×3): qty 1

## 2011-09-19 MED ORDER — DELFLEX-LM/2.5% DEXTROSE 396 MOSM/L IP SOLN: Freq: Once | INTRAPERITONEAL | Status: DC

## 2011-09-19 MED ORDER — HYDROMORPHONE HCL PF 1 MG/ML IJ SOLN: 1.0000 mg | INTRAMUSCULAR | Status: DC | PRN

## 2011-09-19 MED ORDER — HEPARIN SODIUM (PORCINE) 1000 UNIT/ML IJ SOLN
2500.0000 [IU] | INTRAMUSCULAR | Status: DC | PRN
  Administered 2011-09-19: 2500 [IU] via INTRAPERITONEAL
  Filled 2011-09-19: qty 6

## 2011-09-19 MED ORDER — SODIUM CHLORIDE 0.9 % IR SOLN: Freq: Once | Status: DC

## 2011-09-19 MED ORDER — HEPARIN SODIUM (PORCINE) 1000 UNIT/ML IJ SOLN
500.0000 [IU] | INTRAMUSCULAR | Status: DC | PRN
  Filled 2011-09-19 (×2): qty 0.5

## 2011-09-19 NOTE — Progress Notes (Signed)
[facility] Progress Note   Assessment/Plan:  1. Abd pain in pt on PD with pancreatic pseudocyst   2. ESRD on PD tolerating PD 3. High BP   4. Hypokalemia, improved  PLAN  Cont. PD, increase UF for better BP control  Subjective:   Feeling better, no PD issues   Objective:   BP 135/96  Pulse 95  Temp(Src) 98.2 F (36.8 C) (Oral)  Resp 17  Ht 5\' 4"  (1.626 m)  Wt 60.1 kg (132 lb 7.9 oz)  BMI 22.74 kg/m2  SpO2 98%  Intake/Output Summary (Last 24 hours) at 09/19/11 1336 Last data filed at 09/19/11 1014  Gross per 24 hour  Intake    840 ml  Output      0 ml  Net    840 ml   Weight change:   Physical Exam: HYQ:MVHQ appearing CVS:RRR Resp:clear ION:GEXB Ext:tr edema  Imaging: Dg Ugi W/small Bowel  09/17/2011  *RADIOLOGY REPORT*  Clinical Data:Abdominal pain.  Chronic pancreatitis.  Hemorrhagic pseudocyst.  UPPER GI W/ SMALL BOWEL  Technique: Upper GI series performed with high density barium and effervescent agent. Thin barium also used.  Subsequently, serial images of the small bowel were obtained including spot views of the terminal ileum.  Fluoroscopy Time: 5.37 minutes slow pulsed reduced dose fluoroscopy  Comparison: CT scan of the abdomen dated 09/13/2011  Findings: The mucosa and motility of the esophagus are normal.  No hiatal hernia or gastroesophageal reflux.  The patient has edema of the mucosa of the body of the stomach.  There is a 4 cm indentation upon the lesser curvature of the gastric antrum but the previously demonstrated hemorrhagic pseudocyst.  However, there is free flow of barium around the pseudocyst mass effect into the distal antrum and through the pylorus into the duodenal C-loop.  Small bowel transit time was approximately 2 hours and 10 minutes. The small bowel is normal including the terminal ileum.  The appendix fills and is normal.  IMPRESSION: The patient has an extrinsic mass effect upon the antrum of the stomach due to the  hemorrhagic pseudocyst demonstrated on the prior CT scan.  However, there is no obstruction of flow of barium into the duodenum and small bowel.  Small bowel is normal.  There is edema of the mucosa of the stomach.  This could be due to hyperacidity.  Original Report Authenticated By: Gwynn Burly, M.D.    Labs: BMET  Lab 09/19/11 0730 09/18/11 2841 09/17/11 3244 09/16/11 0610 09/15/11 0420 09/14/11 1135 09/13/11 1612  NA 133* 132* 136 133* 136 131* 137  K 3.6 3.7 3.2* 2.8* 3.8 3.9 3.5  CL 97 94* 96 94* 92* 89* 96  CO2 21 24 23 27 28 23 27   GLUCOSE 192* 135* 118* 80 123* 107* 155*  BUN 33* 39* 45* 57* 63* 66* 51*  CREATININE 7.75* 8.33* 8.90* 9.51* 9.33* 9.10* 8.96*  ALB -- -- -- -- -- -- --  CALCIUM 9.7 9.7 9.7 9.5 10.1 9.9 9.8  PHOS 5.4* -- 5.9* -- -- -- --   CBC  Lab 09/19/11 0730 09/18/11 0555 09/17/11 0620 09/16/11 2104 09/16/11 0610 09/13/11 1612  WBC 7.1 6.3 7.3 -- 6.2 --  NEUTROABS -- -- -- -- -- 7.8*  HGB 9.1* 9.1* 9.3* 8.8* -- --  HCT 28.0* 28.1* 28.4* 27.2* -- --  MCV 94.3 95.3 94.0 -- 94.9 --  PLT 150 172 163 -- 144* --    Medications:      .  darbepoetin (ARANESP) injection - NON-DIALYSIS  150 mcg Subcutaneous Q Thu-1800  . dialysis solution 2.5% low-MG   Intraperitoneal Once in dialysis  . doxazosin  2 mg Oral QHS  . feeding supplement  1 Container Oral BID BM  . fentaNYL  25 mcg Transdermal Q72H  . insulin aspart  0-15 Units Subcutaneous TID WC  . ondansetron  8 mg Oral Q8H  . pantoprazole  40 mg Oral BID AC  . ranolazine  500 mg Oral Daily  . sevelamer  3,200 mg Oral TID WC  . sodium chloride  3 mL Intravenous Q12H  . DISCONTD: ondansetron (ZOFRAN) IV  4 mg Intravenous Q6H  . DISCONTD: ondansetron  8 mg Oral Q8H    09/19/2011, 1:36 PM

## 2011-09-19 NOTE — Progress Notes (Signed)
I interviewed and examined this patient and discussed the care plan with Dr. Armen Pickup and the [facility] team and agree with assessment and plan as documented in the progress note for today.    Raequon Catanzaro A. Sheffield Slider, MD Family Medicine Teaching Service Attending  09/19/2011 4:50 PM

## 2011-09-19 NOTE — Progress Notes (Signed)
Patient ID: [name], female   DOB: 03/27/1951, 61 y.o.   MRN: [id number] Daily Progress Note Family Medicine Teaching Service Service Pager: [phone]  Subjective: Patient reports intermittent epigastric pain that radiates to the RLQ. She reports persistent maroon colored stools. She is tolerating her diet. She denies N/V/D.   Objective: Vital signs in last 24 hours: Temp:  [97.9 F (36.6 C)-98.8 F (37.1 C)] 98.8 F (37.1 C) (04/06 0500) Pulse Rate:  [94-110] 110  (04/06 0500) Resp:  [17-18] 18  (04/06 0500) BP: (117-146)/(81-101) 146/101 mmHg (04/06 0500) SpO2:  [98 %-99 %] 98 % (04/06 0500) Weight change:  Last BM Date: 09/18/11  Intake/Output from previous day: 04/05 0701 - 04/06 0700 In: 723 [P.O.:720; I.V.:3] Out: -  Intake/Output this shift:   General appearance: alert, talkative, NAD Head: Normocephalic, without obvious abnormality, atraumatic Resp: clear to auscultation bilaterally Cardio: regular rate and rhythm GI: Epigastric and lower midline tenderness without rebound or guarding. PD cath in place LLQ.  Extremities: extremities normal, atraumatic, no cyanosis or edema Neurologic: Grossly normal  Lab Results:  Basename 09/19/11 0730 09/18/11 0555  WBC 7.1 6.3  HGB 9.1* 9.1*  HCT 28.0* 28.1*  PLT 150 172   BMET  Basename 09/19/11 0730 09/18/11 0555  NA 133* 132*  K 3.6 3.7  CL 97 94*  CO2 21 24  GLUCOSE 192* 135*  BUN 33* 39*  CREATININE 7.75* 8.33*  CALCIUM 9.7 9.7    Studies/Results: CT abdomen and pelvis with contrast 09/13/11:  Enlarged, likely hemorrhagic, pancreatic pseudocyst projecting into the lesser sac with significant mass effect on the antral region of the stomach. MRI of the abdomen without and with contrast may be helpful for further evaluation. EGD 09/15/11: Mild gastritis, duodenal bulb mass/lesion/inflammation, bx sent Upper GI series 09/17/11:  extrinsic mass effect upon the antrum of the stomach due to the hemorrhagic  pseudocyst; no gastric outlet obstruction; normal small bowel; mucosa edema of stomach  Medications:  I have reviewed the patient's current medications. Scheduled:    . darbepoetin (ARANESP) injection - NON-DIALYSIS  150 mcg Subcutaneous Q Thu-1800  . dialysis solution 2.5% low-MG   Intraperitoneal Once in dialysis  . dialysis solution 2.5% low-MG   Intraperitoneal Once in dialysis  . doxazosin  2 mg Oral QHS  . feeding supplement  1 Container Oral BID BM  . fentaNYL  50 mcg Transdermal Q72H  . insulin aspart  0-15 Units Subcutaneous TID WC  . ondansetron  8 mg Oral Q8H  . pantoprazole  40 mg Oral BID AC  . ranolazine  500 mg Oral Daily  . sevelamer  3,200 mg Oral TID WC  . sodium chloride  3 mL Intravenous Q12H  . DISCONTD: fentaNYL  25 mcg Transdermal Q72H  . DISCONTD: ondansetron (ZOFRAN) IV  4 mg Intravenous Q6H  . DISCONTD: ondansetron  8 mg Oral Q8H   Continuous:    . sodium chloride 20 mL/hr at 09/18/11 0300   JYN:WGNFAOZHYQMVH (DILAUDID) injection  Assessment/Plan: This is a 61 year old African-American female with a history of chronic pancreatitis, nausea/vomiting for the past year, and ESRD on home peritoneal dialysis who presents with worsening nausea/vomiting.   1.Pancreatic pseudocyst--the patient was recently hospitalized for N/V and hematemesis at the [facility] from 03/05-03/19. CT-abd/pelvis 01/2011 (most recent one done at [facility]) showed pancreas with two peripancreatic fluid collections (likely pseudocysts) along body/tail with mild peripancreatic stranding consistent with mild pancreatitis. History of chronic pancreatitis since 2003 attributed to gallstones. Improved following cholecystectomy  but has been having symptoms (abdomina pain/nausea/vomiting) consistent with previous pancreatitis for the past year. Patient told recent hospitalization she has pancreatic pseudocysts. Repeat CT scan done today shows enlarging of pseudocyst with hemorrhagic process. Lipase  elevated at 147 (in the past 7mo ago, 260-270). However, patient afebrile and WBC WNL. LFTs not elevated. Patient admitted with hematemesis, developed rectal bleeding yesterday. Since then, no vomiting and decreased yet persistent melena.  Plan:  - Pain control: fentanyl patch increased dose to 50 mcg q 48 hrs. Patient does not want to go home on PO dilaudid.  -Zofran 8 mg PO q 8 scheduled  - Surgery consulted on patient and we appreciate their input. Surgery signed off. - GI consulted and did EGD which revealed gastritis and duodenal bulb. We appreciate GI's assistance in the care of this patient. UGI series did not reveal obstruction. Bx results do not show malignancy. According to GI recommendations the patient will be best served by following up with a GI specialist at [facility] for a cyst gastrostomy. The patient is agreeable to this but she reports that as a veteran referrals need to be made through fee basis at [facility].She request that [name] does this to ensure that she can follow-up.   2. Anemia--she has a longstanding history of anemia for several years. Previously, her anemia was attributed to heavy periods, however, her anemia has persisted after menopause. She receives periodic blood transfusions. The cause of her anemia is unknown. She has seen hematologists in the past. She thinks her chronic renal disease is the cause of her anemia. Her baseline hemoglobin is around 8. She receives Aranesp treatments weekly. During her recent hospitalization at the [facility], she saw a GI specialist who performed an EGD which revealed the duodenal bulb.  Anemia on admission likely 2/2 to GI bleed. Patient is asymptomatic from her anemia (no tachycardia, BP stable) which is not surprising given her long standing history.  - Hgb 6.5 on arrival. S/p 2U PRBC, HgB 9.1 this morning which is stable. No s/sx of bleed at this time. - Continue to hold home ASA -Repeat CBC in AM.   3. History  of ESRD on peritoneal dialysis--secondary to poorly controlled hypertension and diabetes secondary to medical non-compliance. Started peritoneal dialysis about 1 year ago. Patient makes urine (about 1/2 cup daily).  - Dialysis every day. Electrolytes okay currently on admission. - Renal service following, and we appreciate their help with this patient's care. She is getting daily PD. - Home medications: renolazine, sevelamer which are being continued at this time.   4. Cardiology--patient reports history of HTN but blood pressures not atypically 100s systolics.  - History of severe chronic systolic heart failure--05/2011 TEE: EF 5-10%. Followed by heart failure cardiologist in [facility]. She was recently hospitalized 05/2011 for acute on chronic heart failure secondary to blood transfusion. She denies any symptoms at this time during blood transfusion, but she knows her body well enough to let us know if she has any changes - History of CAD s/p CABG 2008 with multiple PCI  - History of ischemic cardiomyopathy--although patient denies history of heart attack.  - History of hyperlipidemia  - Fluid restriction. Monitor for signs of fluid overload during transfusion.  - Home medications: Coreg 25 bid, doxazosin 4 qhs, hydralazine 25 bid/Imdur, pravastatin 20>>>hold hydralazine/Imdur, pravstatin. All home medications held due to orthostasis twp days ago. Will restart coreg today.   5. T2DM--recent HgbA1c between 5-6. She uses insulin intermittently. It  had been poorly controlled in the past.  CBGs 70-196 in the past 24 hours.  - Home medications: Lantus 10 prn. - Continue SSI and monitor CBG.    6. S/p fall- Patient had fall secondary to likely orthostatic hypotension on 09/17/11 while transferring from wheelchair to bed in radiology.  -No repeat falls or hypotension since the incident.   7. FEN/GI  -IVF: NS @ KVO.  -Diet: Full liquids advance as tolerated.   8. PPx:  -DVT PPx: SCD (no heparin 2/2  anemia and GI bleed)  -SUP: Protonix   Dispo: Pending stable Hgb and adequate pain control with patch on medications to home with outpatient GI f/u.   CODE: DNR/DNI   LOS: 6 days   Yazmina Pareja 09/19/2011, 2:26 PM

## 2011-09-20 LAB — CBC
HCT: 28.2 % — ABNORMAL LOW (ref 36.0–46.0)
Hemoglobin: 9.1 g/dL — ABNORMAL LOW (ref 12.0–15.0)
MCH: 30.4 pg (ref 26.0–34.0)
MCV: 94.3 fL (ref 78.0–100.0)
Platelets: 168 10*3/uL (ref 150–400)
RBC: 2.99 MIL/uL — ABNORMAL LOW (ref 3.87–5.11)

## 2011-09-20 LAB — GLUCOSE, CAPILLARY: Glucose-Capillary: 130 mg/dL — ABNORMAL HIGH (ref 70–99)

## 2011-09-20 MED ORDER — CARVEDILOL 6.25 MG PO TABS: 6.2500 mg | ORAL_TABLET | Freq: Two times a day (BID) | ORAL | Status: DC

## 2011-09-20 MED ORDER — HYDROMORPHONE HCL 2 MG PO TABS: 2.0000 mg | ORAL_TABLET | ORAL | Status: AC | PRN

## 2011-09-20 MED ORDER — DOXAZOSIN MESYLATE 2 MG PO TABS: 2.0000 mg | ORAL_TABLET | Freq: Every day | ORAL | Status: DC

## 2011-09-20 MED ORDER — FENTANYL 50 MCG/HR TD PT72: 1.0000 | MEDICATED_PATCH | TRANSDERMAL | Status: DC

## 2011-09-20 NOTE — Progress Notes (Signed)
Patient ID: [name], female   DOB: 1950/10/04, 61 y.o.   MRN: [id number] Daily Progress Note Family Medicine Teaching Service Service Pager: [phone]  Subjective: Patient reports improved pain control with fentanyl patch and PO dilaudid.  Still having some blood in stool.    Objective: Vital signs in last 24 hours: Temp:  [97.7 F (36.5 C)-98.3 F (36.8 C)] 97.9 F (36.6 C) (04/07 0511) Pulse Rate:  [65-99] 99  (04/07 0511) Resp:  [17-20] 18  (04/07 0511) BP: (116-135)/(78-96) 124/79 mmHg (04/07 0511) SpO2:  [95 %-98 %] 96 % (04/07 0511) Weight change:  Last BM Date: 09/18/11  Intake/Output from previous day: 04/06 0701 - 04/07 0700 In: 720 [P.O.:720] Out: -   Exam: General appearance: alert, talkative, NAD Head: Normocephalic, without obvious abnormality, atraumatic Resp: clear to auscultation bilaterally Cardio: regular rate and rhythm GI: mild epigastric and lower midline tenderness without rebound or guarding. PD cath in place LLQ.  Extremities: extremities normal, atraumatic, no cyanosis or edema Neurologic: Grossly normal  Lab Results:  Basename 09/20/11 0532 09/19/11 0730  WBC 7.0 7.1  HGB 9.1* 9.1*  HCT 28.2* 28.0*  PLT 168 150   BMET  Basename 09/19/11 0730 09/18/11 0555  NA 133* 132*  K 3.6 3.7  CL 97 94*  CO2 21 24  GLUCOSE 192* 135*  BUN 33* 39*  CREATININE 7.75* 8.33*  CALCIUM 9.7 9.7    Studies/Results: CT abdomen and pelvis with contrast 09/13/11:  Enlarged, likely hemorrhagic, pancreatic pseudocyst projecting into the lesser sac with significant mass effect on the antral region of the stomach. MRI of the abdomen without and with contrast may be helpful for further evaluation. EGD 09/15/11: Mild gastritis, duodenal bulb mass/lesion/inflammation, bx sent Upper GI series 09/17/11:  extrinsic mass effect upon the antrum of the stomach due to the hemorrhagic pseudocyst; no gastric outlet obstruction; normal small bowel; mucosa edema of  stomach  Medications:  I have reviewed the patient's current medications. Scheduled:    . carvedilol  6.25 mg Oral BID WC  . darbepoetin (ARANESP) injection - NON-DIALYSIS  150 mcg Subcutaneous Q Thu-1800  . dialysis solution 2.5% low-MG   Intraperitoneal Once in dialysis  . doxazosin  2 mg Oral QHS  . feeding supplement  1 Container Oral BID BM  . fentaNYL  50 mcg Transdermal Q72H  . insulin aspart  0-15 Units Subcutaneous TID WC  . ondansetron  8 mg Oral Q8H  . pantoprazole  40 mg Oral BID AC  . ranolazine  500 mg Oral Daily  . sevelamer  3,200 mg Oral TID WC  . sodium chloride  3 mL Intravenous Q12H  . DISCONTD: fentaNYL  25 mcg Transdermal Q72H  . DISCONTD: heparin 500 unit irrigation   Irrigation Once   Continuous:    . sodium chloride 20 mL/hr at 09/18/11 0300  . heparin 2,500 Units (09/19/11 2331)  . DISCONTD: heparin     JYN:WGNFAOZ, HYDROmorphone, traMADol, DISCONTD: heparin, DISCONTD:  HYDROmorphone (DILAUDID) injection, DISCONTD:  HYDROmorphone (DILAUDID) injection, DISCONTD: traMADol  Assessment/Plan: This is a 61 year old African-American female with a history of chronic pancreatitis, nausea/vomiting for the past year, and ESRD on home peritoneal dialysis who presents with worsening nausea/vomiting.   1.Pancreatic pseudocyst--the patient was recently hospitalized for N/V and hematemesis at the [facility] from 03/05-03/19. CT-abd/pelvis 01/2011 (most recent one done at [facility]) showed pancreas with two peripancreatic fluid collections (likely pseudocysts) along body/tail with mild peripancreatic stranding consistent with mild pancreatitis. History of chronic pancreatitis since  2003 attributed to gallstones. Improved following cholecystectomy but has been having symptoms (abdomina pain/nausea/vomiting) consistent with previous pancreatitis for the past year. Patient told recent hospitalization she has pancreatic pseudocysts. Repeat CT scan done today shows enlarging of  pseudocyst with hemorrhagic process. Lipase elevated at 147 (in the past 49mo ago, 260-270). However, patient afebrile and WBC WNL. LFTs not elevated. Patient admitted with hematemesis, developed rectal bleeding yesterday. Since then, no vomiting and decreased yet persistent melena.  Plan:  - Pain control: fentanyl patch increased dose to 50 mcg q 48 hrs. Patient does not want to go home on PO dilaudid.  -Zofran 8 mg PO q 8 scheduled  - Surgery consulted on patient and we appreciate their input. Surgery signed off. - GI consulted and did EGD which revealed gastritis and duodenal bulb. We appreciate GI's assistance in the care of this patient. UGI series did not reveal obstruction. Bx results do not show malignancy. According to GI recommendations the patient will be best served by following up with a GI specialist at [facility] for a cyst gastrostomy. The patient is agreeable to this but she reports that as a veteran referrals need to be made through fee basis at [facility].She request that [name] does this to ensure that she can follow-up.   2. Anemia--she has a longstanding history of anemia for several years. Previously, her anemia was attributed to heavy periods, however, her anemia has persisted after menopause. She receives periodic blood transfusions. The cause of her anemia is unknown. She has seen hematologists in the past. She thinks her chronic renal disease is the cause of her anemia. Her baseline hemoglobin is around 8. She receives Aranesp treatments weekly. During her recent hospitalization at the [facility], she saw a GI specialist who performed an EGD which revealed the duodenal bulb.  Anemia on admission likely 2/2 to GI bleed. Patient is asymptomatic from her anemia (no tachycardia, BP stable) which is not surprising given her long standing history.  - Hgb 6.5 on arrival. S/p 2U PRBC, HgB 9.1 again this morning which is stable.  - Continue to hold home ASA  3. History of  ESRD on peritoneal dialysis--secondary to poorly controlled hypertension and diabetes secondary to medical non-compliance. Started peritoneal dialysis about 1 year ago. Patient makes urine (about 1/2 cup daily).  - Dialysis every day. Electrolytes okay currently on admission. - Renal service following, and we appreciate their help with this patient's care. She is getting daily PD. - Home medications: renolazine, sevelamer which are being continued at this time.   4. Cardiology--patient reports history of HTN but blood pressures not atypically 100s systolics.  - History of severe chronic systolic heart failure--05/2011 TEE: EF 5-10%. Followed by heart failure cardiologist in [facility]. She was recently hospitalized 05/2011 for acute on chronic heart failure secondary to blood transfusion. She denies any symptoms at this time during blood transfusion, but she knows her body well enough to let us know if she has any changes - History of CAD s/p CABG 2008 with multiple PCI  - History of ischemic cardiomyopathy--although patient denies history of heart attack.  - History of hyperlipidemia  - Fluid restriction. Monitor for signs of fluid overload during transfusion.  -Home medications held due to orthostasis and fall on 4/4.  Coreg re-started yesterday, BP with improved control.    5. T2DM--recent HgbA1c between 5-6. She uses insulin intermittently. It had been poorly controlled in the past.  CBGs 70-196 in the past 24  hours.  - Home medications: Lantus 10 prn. - Continue SSI and monitor CBG.    6. S/p fall- Patient had fall secondary to likely orthostatic hypotension on 09/17/11 while transferring from wheelchair to bed in radiology.  -No repeat falls or hypotension since the incident.   7. FEN/GI  -IVF: NS @ KVO.  -Diet: Full liquids advance as tolerated.   8. PPx:  -DVT PPx: SCD (no heparin 2/2 anemia and GI bleed)  -SUP: Protonix   Dispo: Patient now with stable hemoglobin and pain controled  on fentanyl patch and PO dilaudid.  Will discharge home today with plan for her to follow up at [facility].   CODE: DNR/DNI   LOS: 7 days   Laurice Iglesia 09/20/2011, 8:42 AM

## 2011-09-20 NOTE — Discharge Instructions (Signed)
Full Liquid Diet The full liquid diet includes those foods that are liquid or will become liquid at body temperature. This diet is very restrictive. Its use should be limited to a short period of time and only under the advice or supervision of your caregiver or dietitian.  A high-calorie, high-protein supplement should be used to meet your nutritional requirements when the full liquid diet is continued for more than 2 or 3 days. If this diet is to be used for an extended period of time (more than 7 days), a multivitamin should be considered. REASONS FOR USE  As a transition diet between the clear liquid diet and solid foods.   When patients cannot tolerate solid foods.  ADEQUACY The full liquid diet is nutritionally inadequate according to the Recommended Dietary Allowances of the [employer], except in ascorbic acid and calcium. Protein requirements can be met if adequate amounts of dairy products are consumed daily. The full liquid diet can be nutritionally adequate if it is fortified with a nutritional supplement. Your caregiver can give you recommendations on liquids that have nutritional supplements included in them. CHOOSING FOODS Breads and Starches  Allowed: None are allowed except crackers that are pureed (made into a thick, smooth soup) in soup. Cooked, refined corn, oat, rice, rye, and wheat cereals are also allowed.   Avoid: Any others.  Potatoes/Pasta/Rice  Allowed: None except pureed in soup.   Avoid: Any others.  Vegetables  Allowed: Strained tomato or vegetable juice. Vegetables pureed in soup.   Avoid: Any others.  Fruit  Allowed: Any strained fruit juices and fruit drinks. Include 1 serving of citrus or vitamin C-enriched fruit juice daily.   Avoid: Any others.  Meat and Meat Substitutes  Allowed: Eggs in custard, eggnog mix, eggs used in ice cream or pudding.   Avoid: Any meat, fish, or fowl. All cheese. All other cooked or raw eggs.   Milk  Allowed: Milk and milk-based beverages, including milk shakes and instant breakfast mixes. Smooth yogurt.   Avoid: Any others. Avoid dairy products if not tolerated.  Soups and Combination Foods  Allowed: Broth, strained cream soups. Strained, broth-based soups.   Avoid: Any others.  Desserts and Sweets  Allowed: Custard, flavored gelatin, tapioca, plain ice cream, sherbet, smooth pudding, junket, fruit ices, frozen ice pops, pudding pops. Other frozen bars with cream, frozen fudge pops, chocolate syrup. Sugar, honey, jelly, syrup.   Avoid: Any others.  Fats and Oils  Allowed: Margarine, butter, cream, sour cream, oils.   Avoid: Any others.  Beverages  Allowed: All.   Avoid: None.  Condiments  Allowed: Iodized salt, pepper, spices, flavorings. Cocoa powder.   Avoid: Any others.  SAMPLE MEAL PLAN Breakfast   cup orange juice.   1 cup cooked wheat cereal.   1 cup milk.   1 cup beverage (coffee or tea).   Cream or sugar, if desired.  Midmorning Snack  1 cup pasteurized eggnog (made from powdered eggs mixed with milk, not raw eggs).  Lunch  1 cup cream soup.    cup fruit juice.   1 cup milk.    cup custard.   1 cup beverage (coffee or tea).   Cream or sugar, if desired.  Midafternoon Snack  1 cup milk shake.  Dinner  1 cup cream soup.    cup fruit juice.   1 cup milk.    cup pudding.   1 cup beverage (coffee or tea).   Cream or sugar, if desired.  Evening  Snack  1 cup supplement.  To increase calories, add sugar, cream, butter, or margarine if possible. Nutritional supplements will also increase the total calories. The above sample meal plan cannot meet the Recommended Dietary Allowances of the [employer] without appropriate supplementation under the guidance of your caregiver or dietitian. Document Released: 06/01/2005 Document Revised: 05/21/2011 Document Reviewed: 03/04/2007 [facility] Patient Information 2012  [place], [place].

## 2011-09-21 NOTE — Progress Notes (Signed)
Late entry:     CARE MANAGEMENT NOTE 09/21/2011  Patient:  [name], [name]   Account Number:  [id number]  Date Initiated:  09/15/2011  Documentation initiated by:  [facility]  Subjective/Objective Assessment:   Nausea/vomiting, hematemesis, DM, HTN, anemia     Action/Plan:   Anticipated DC Date:  09/20/2011   Anticipated DC Plan:  HOME/SELF CARE      DC Planning Services  CM consult      Choice offered to / List presented to:             Status of service:  Completed, signed off Medicare Important Message given?   (If response is "NO", the following Medicare IM given date fields will be blank) Date Medicare IM given:   Date Additional Medicare IM given:    Discharge Disposition:  HOME/SELF CARE  Per UR Regulation:    If discussed at Long Length of Stay Meetings, dates discussed:    Comments:  09-17-11 0951 [name], RN,BSN [phone] Medical Records to be faxed to [id number] from [facility].  09/16/2011 1530 Faxed Preference to Transfer Form to [facility]. Pt has refused transfer to [place] at this time. Wants to remain at [facility]. Faxed request for medical records to [facility]. Isidoro Donning RN CCM Case Mgmt phone [phone]  09/16/2011 0900 Spoke to pt and she does not want to transfer to [facility]. Contacted [facility] [place] to request paperwork. Left message on vm for Alejandro Mulling, Transfer Coordinator. Isidoro Donning RN CCM Case Mgmt phone [phone]  09/15/2011 1545 Pt goes to [place]. Spoke to attending MD and [facility] does not have Gastroenterology. Pt has not decided on if she wants transfer. Contacted [facility] and will have transfer papers faxed to [facility]. Isidoro Donning RN CCM Case Mgmt phone [phone]   09/21/2011 [facility] aware [facility] d[facility].  Johny Shock RN MPH Case Manager [phone]

## 2011-09-22 NOTE — Discharge Summary (Signed)
I discussed with Dr Mikel Cella.  I agree with their plans documented in their discharge note.

## 2011-10-03 ENCOUNTER — Inpatient Hospital Stay (HOSPITAL_COMMUNITY)
Admission: EM | Admit: 2011-10-03 | Discharge: 2011-10-14 | DRG: 871 | Payer: Medicare Other | Attending: Family Medicine | Admitting: Family Medicine

## 2011-10-03 ENCOUNTER — Emergency Department (HOSPITAL_COMMUNITY): Payer: Medicare Other

## 2011-10-03 ENCOUNTER — Encounter (HOSPITAL_COMMUNITY): Payer: Self-pay

## 2011-10-03 DIAGNOSIS — I255 Ischemic cardiomyopathy: Secondary | ICD-10-CM | POA: Diagnosis present

## 2011-10-03 DIAGNOSIS — H269 Unspecified cataract: Secondary | ICD-10-CM | POA: Diagnosis present

## 2011-10-03 DIAGNOSIS — K862 Cyst of pancreas: Secondary | ICD-10-CM | POA: Diagnosis present

## 2011-10-03 DIAGNOSIS — G9340 Encephalopathy, unspecified: Secondary | ICD-10-CM | POA: Diagnosis not present

## 2011-10-03 DIAGNOSIS — E119 Type 2 diabetes mellitus without complications: Secondary | ICD-10-CM | POA: Diagnosis present

## 2011-10-03 DIAGNOSIS — I81 Portal vein thrombosis: Secondary | ICD-10-CM | POA: Diagnosis present

## 2011-10-03 DIAGNOSIS — R1013 Epigastric pain: Secondary | ICD-10-CM

## 2011-10-03 DIAGNOSIS — K5730 Diverticulosis of large intestine without perforation or abscess without bleeding: Secondary | ICD-10-CM | POA: Diagnosis present

## 2011-10-03 DIAGNOSIS — I2510 Atherosclerotic heart disease of native coronary artery without angina pectoris: Secondary | ICD-10-CM | POA: Diagnosis present

## 2011-10-03 DIAGNOSIS — N185 Chronic kidney disease, stage 5: Secondary | ICD-10-CM | POA: Diagnosis present

## 2011-10-03 DIAGNOSIS — K5641 Fecal impaction: Secondary | ICD-10-CM | POA: Diagnosis not present

## 2011-10-03 DIAGNOSIS — K863 Pseudocyst of pancreas: Secondary | ICD-10-CM | POA: Diagnosis present

## 2011-10-03 DIAGNOSIS — E871 Hypo-osmolality and hyponatremia: Secondary | ICD-10-CM | POA: Diagnosis present

## 2011-10-03 DIAGNOSIS — K861 Other chronic pancreatitis: Secondary | ICD-10-CM | POA: Diagnosis present

## 2011-10-03 DIAGNOSIS — R7881 Bacteremia: Secondary | ICD-10-CM | POA: Diagnosis present

## 2011-10-03 DIAGNOSIS — K648 Other hemorrhoids: Secondary | ICD-10-CM | POA: Diagnosis present

## 2011-10-03 DIAGNOSIS — I509 Heart failure, unspecified: Secondary | ICD-10-CM | POA: Diagnosis present

## 2011-10-03 DIAGNOSIS — Z992 Dependence on renal dialysis: Secondary | ICD-10-CM

## 2011-10-03 DIAGNOSIS — N8111 Cystocele, midline: Secondary | ICD-10-CM | POA: Diagnosis present

## 2011-10-03 DIAGNOSIS — E875 Hyperkalemia: Secondary | ICD-10-CM | POA: Diagnosis not present

## 2011-10-03 DIAGNOSIS — I120 Hypertensive chronic kidney disease with stage 5 chronic kidney disease or end stage renal disease: Secondary | ICD-10-CM | POA: Diagnosis present

## 2011-10-03 DIAGNOSIS — Z794 Long term (current) use of insulin: Secondary | ICD-10-CM

## 2011-10-03 DIAGNOSIS — R69 Illness, unspecified: Secondary | ICD-10-CM | POA: Diagnosis not present

## 2011-10-03 DIAGNOSIS — I2589 Other forms of chronic ischemic heart disease: Secondary | ICD-10-CM | POA: Diagnosis present

## 2011-10-03 DIAGNOSIS — E876 Hypokalemia: Secondary | ICD-10-CM | POA: Diagnosis not present

## 2011-10-03 DIAGNOSIS — K644 Residual hemorrhoidal skin tags: Secondary | ICD-10-CM | POA: Diagnosis present

## 2011-10-03 DIAGNOSIS — D631 Anemia in chronic kidney disease: Secondary | ICD-10-CM | POA: Diagnosis present

## 2011-10-03 DIAGNOSIS — E785 Hyperlipidemia, unspecified: Secondary | ICD-10-CM | POA: Diagnosis present

## 2011-10-03 DIAGNOSIS — D696 Thrombocytopenia, unspecified: Secondary | ICD-10-CM | POA: Diagnosis present

## 2011-10-03 DIAGNOSIS — A419 Sepsis, unspecified organism: Secondary | ICD-10-CM | POA: Diagnosis not present

## 2011-10-03 DIAGNOSIS — E43 Unspecified severe protein-calorie malnutrition: Secondary | ICD-10-CM | POA: Diagnosis not present

## 2011-10-03 DIAGNOSIS — A4159 Other Gram-negative sepsis: Secondary | ICD-10-CM | POA: Diagnosis present

## 2011-10-03 DIAGNOSIS — I447 Left bundle-branch block, unspecified: Secondary | ICD-10-CM | POA: Diagnosis present

## 2011-10-03 DIAGNOSIS — N186 End stage renal disease: Secondary | ICD-10-CM | POA: Diagnosis present

## 2011-10-03 DIAGNOSIS — A4102 Sepsis due to Methicillin resistant Staphylococcus aureus: Principal | ICD-10-CM | POA: Diagnosis present

## 2011-10-03 DIAGNOSIS — K631 Perforation of intestine (nontraumatic): Secondary | ICD-10-CM | POA: Diagnosis present

## 2011-10-03 DIAGNOSIS — I5022 Chronic systolic (congestive) heart failure: Secondary | ICD-10-CM | POA: Diagnosis present

## 2011-10-03 DIAGNOSIS — B49 Unspecified mycosis: Secondary | ICD-10-CM | POA: Diagnosis not present

## 2011-10-03 DIAGNOSIS — D638 Anemia in other chronic diseases classified elsewhere: Secondary | ICD-10-CM | POA: Diagnosis present

## 2011-10-03 LAB — URINALYSIS, ROUTINE W REFLEX MICROSCOPIC
Glucose, UA: NEGATIVE mg/dL
Ketones, ur: 15 mg/dL — AB
Nitrite: NEGATIVE
Protein, ur: 100 mg/dL — AB

## 2011-10-03 LAB — COMPREHENSIVE METABOLIC PANEL
BUN: 86 mg/dL — ABNORMAL HIGH (ref 6–23)
CO2: 25 mEq/L (ref 19–32)
Chloride: 85 mEq/L — ABNORMAL LOW (ref 96–112)
Creatinine, Ser: 8.74 mg/dL — ABNORMAL HIGH (ref 0.50–1.10)
GFR calc non Af Amer: 4 mL/min — ABNORMAL LOW (ref >90)
Total Bilirubin: 0.3 mg/dL (ref 0.3–1.2)

## 2011-10-03 LAB — LIPASE, BLOOD: Lipase: 10 U/L — ABNORMAL LOW (ref 11–59)

## 2011-10-03 LAB — POCT I-STAT, CHEM 8
BUN: 108 mg/dL — ABNORMAL HIGH (ref 6–23)
Chloride: 97 mEq/L (ref 96–112)
Creatinine, Ser: 9.3 mg/dL — ABNORMAL HIGH (ref 0.50–1.10)
Sodium: 125 mEq/L — ABNORMAL LOW (ref 135–145)
TCO2: 25 mmol/L (ref 0–100)

## 2011-10-03 LAB — URINE MICROSCOPIC-ADD ON

## 2011-10-03 LAB — CBC
HCT: 26.5 % — ABNORMAL LOW (ref 36.0–46.0)
MCV: 86.9 fL (ref 78.0–100.0)
RBC: 3.05 MIL/uL — ABNORMAL LOW (ref 3.87–5.11)
RDW: 18.8 % — ABNORMAL HIGH (ref 11.5–15.5)
WBC: 23.8 10*3/uL — ABNORMAL HIGH (ref 4.0–10.5)

## 2011-10-03 LAB — POTASSIUM: Potassium: 5.3 mEq/L — ABNORMAL HIGH (ref 3.5–5.1)

## 2011-10-03 LAB — DIFFERENTIAL
Eosinophils Relative: 0 % (ref 0–5)
Lymphs Abs: 0.5 10*3/uL — ABNORMAL LOW (ref 0.7–4.0)
Monocytes Relative: 4 % (ref 3–12)
Neutro Abs: 22.3 10*3/uL — ABNORMAL HIGH (ref 1.7–7.7)

## 2011-10-03 MED ORDER — METOCLOPRAMIDE HCL 5 MG/ML IJ SOLN
10.0000 mg | Freq: Once | INTRAMUSCULAR | Status: AC
  Administered 2011-10-03: 10 mg via INTRAVENOUS

## 2011-10-03 MED ORDER — HYDROMORPHONE HCL PF 1 MG/ML IJ SOLN
1.0000 mg | Freq: Once | INTRAMUSCULAR | Status: AC
  Administered 2011-10-03: 1 mg via INTRAVENOUS
  Filled 2011-10-03: qty 1

## 2011-10-03 MED ORDER — ONDANSETRON HCL 4 MG/2ML IJ SOLN
4.0000 mg | Freq: Once | INTRAMUSCULAR | Status: AC
  Administered 2011-10-03: 4 mg via INTRAVENOUS
  Filled 2011-10-03: qty 2

## 2011-10-03 MED ORDER — SODIUM POLYSTYRENE SULFONATE 15 GM/60ML PO SUSP
30.0000 g | Freq: Once | ORAL | Status: AC
  Administered 2011-10-03: 30 g via ORAL
  Filled 2011-10-03: qty 120

## 2011-10-03 MED ORDER — SODIUM POLYSTYRENE SULFONATE 15 GM/60ML PO SUSP: 45.0000 g | Freq: Once | ORAL | Status: DC

## 2011-10-03 MED ORDER — METOCLOPRAMIDE HCL 5 MG/ML IJ SOLN
INTRAMUSCULAR | Status: AC
  Filled 2011-10-03: qty 2

## 2011-10-03 NOTE — ED Provider Notes (Signed)
6:31 PM  Date: 10/03/2011  Rate: 96  Rhythm: normal sinus rhythm  QRS Axis: left  Intervals: QT prolonged PQRS:  Left atrial abnormality  ST/T Wave abnormalities: normal  Conduction Disutrbances:left bundle branch block  Narrative Interpretation: Abnormal EKG.   Old EKG Reviewed: changes noted--was in ventricular trigeminy on 06/02/2011.    Carleene Cooper III, MD 10/03/11 [phone]

## 2011-10-03 NOTE — ED Provider Notes (Signed)
History     CSN: [id number]  Arrival date & time 10/03/11  1748   First MD Initiated Contact with Patient 10/03/11 1817      Chief Complaint  Patient presents with  . Abdominal Pain    (Consider location/radiation/quality/duration/timing/severity/associated sxs/prior treatment) Patient is a 61 y.o. female presenting with abdominal pain. The history is provided by the patient. No language interpreter was used.  Abdominal Pain The primary symptoms of the illness include abdominal pain, nausea and vaginal discharge. The primary symptoms of the illness do not include fever, shortness of breath, vomiting, diarrhea or vaginal bleeding. The problem has been gradually worsening.  The illness is associated with a recent illness. The patient states that she believes she is currently not pregnant. The patient has not had a change in bowel habit. Symptoms associated with the illness do not include constipation.   61 year old female coming in with severe abdominal pain. Umbilical and epigastric. States that similar pain that she had before she was discharged from the hospital on 09/22/2011 she was diagnosed with a pancreatic pseudocyst. She has CTs done at that time that showed no blockage. She also had an upper GI and a EGD as well. Patient is diaphoretic and holding her stomach. States she has not vomited but she is nauseated. States that her pain has not gone away since she was discharged from the hospital. Supposed to follow with Dr. Audley Hose did speak to him on the phone.  She has an EF of 5-10% is followed by the cardiologist at [facility]. She's also has end-stage renal disease on peritoneal dialysis at home. Her potassium today is 5.2 sodium is 128. She did not have her dialysis today but she did have a yesterday. She also held her Lantus 10 units this a.m. Her white count is 22 today.   Past Medical History  Diagnosis Date  . Gallstone pancreatitis   . Coronary artery disease     s/p CABG 2008 with  multiple PCIs  . Ischemic cardiomyopathy   . End stage renal disease   . Diabetes mellitus   . Hypertension   . History of nonadherence to medical treatment   . LBBB (left bundle branch block)   . Hyperlipidemia   . Renal insufficiency   . CHF (congestive heart failure)   . Dialysis patient   . Anemia     Past Surgical History  Procedure Date  . Coronary artery bypass graft 2008  . Cholecystectomy   . Abdominal hysterectomy   . Tubal ligation   . Tonsillectomy   . Insert / replace / remove pacemaker     pacemaker ICD  . Esophagogastroduodenoscopy 09/15/2011    Procedure: ESOPHAGOGASTRODUODENOSCOPY (EGD);  Surgeon: Theda Belfast, MD;  Location: [facility] ENDOSCOPY;  Service: Endoscopy;  Laterality: N/A;    Family History  Problem Relation Age of Onset  . Coronary artery disease Mother   . Hypertension Mother   . Diabetes Mother   . Diabetes Sister   . Anesthesia problems Neg Hx     History  Substance Use Topics  . Smoking status: Former Smoker    Quit date: 06/15/2000  . Smokeless tobacco: Not on file  . Alcohol Use: No    OB History    Grav Para Term Preterm Abortions TAB SAB Ect Mult Living                  Review of Systems  Constitutional: Negative.  Negative for fever.  HENT: Negative.  Eyes: Negative.   Respiratory: Negative.  Negative for shortness of breath.   Cardiovascular: Negative.   Gastrointestinal: Positive for nausea and abdominal pain. Negative for vomiting, diarrhea, constipation, blood in stool and anal bleeding.  Genitourinary: Positive for vaginal discharge. Negative for vaginal bleeding.  Skin: Negative.   Neurological: Negative.   Psychiatric/Behavioral: Negative.   All other systems reviewed and are negative.    Allergies  Morphine and related; Codeine; and Lisinopril  Home Medications   Current Outpatient Rx  Name Route Sig Dispense Refill  . CARVEDILOL 6.25 MG PO TABS Oral Take 6.25 mg by mouth 2 (two) times daily with a meal.     . DOXAZOSIN MESYLATE 2 MG PO TABS Oral Take 2 mg by mouth at bedtime.    . FENTANYL 50 MCG/HR TD PT72 Transdermal Place 1 patch onto the skin every 3 (three) days.    . INSULIN GLARGINE 100 UNIT/ML [place] SOLN Subcutaneous Inject 10 Units into the skin daily as needed. Per sliding scale    . DAILY VITAMIN PO Oral Take 1 tablet by mouth daily.     [name] PRAVASTATIN SODIUM 20 MG PO TABS Oral Take 20 mg by mouth daily.      [name] RANOLAZINE ER 500 MG PO TB12 Oral Take 500 mg by mouth daily.      [name] SEVELAMER HCL 800 MG PO TABS Oral Take 3,200 mg by mouth 3 (three) times daily with meals.       BP 119/80  Pulse 96  Temp(Src) 97.7 F (36.5 C) (Oral)  Resp 14  Ht 5\' 4"  (1.626 m)  Wt 136 lb (61.689 kg)  BMI 23.34 kg/m2  SpO2 99%  Physical Exam  Nursing note and vitals reviewed. Constitutional: She is oriented to person, place, and time. She appears well-developed and well-nourished.  HENT:  Head: Normocephalic and atraumatic.  Eyes: Conjunctivae and EOM are normal. Pupils are equal, round, and reactive to light.  Neck: Normal range of motion. Neck supple.  Cardiovascular: Normal heart sounds.        Tachycardia   Pulmonary/Chest: Effort normal and breath sounds normal. No respiratory distress.  Abdominal: Soft. Bowel sounds are normal. She exhibits no distension. There is tenderness. There is no rebound and no guarding.  Musculoskeletal: Normal range of motion. She exhibits no edema and no tenderness.  Neurological: She is alert and oriented to person, place, and time. She has normal reflexes.  Skin: Skin is warm and dry.  Psychiatric: She has a normal mood and affect.    ED Course  Procedures (including critical care time)  Labs Reviewed  CBC - Abnormal; Notable for the following:    WBC 23.8 (*)    RBC 3.05 (*)    Hemoglobin 8.8 (*) DELTA CHECK NOTED   HCT 26.5 (*)    RDW 18.8 (*)    Platelets 121 (*)    All other components within normal limits  DIFFERENTIAL - Abnormal; Notable for  the following:    Neutrophils Relative 94 (*)    Lymphocytes Relative 2 (*)    Neutro Abs 22.3 (*)    Lymphs Abs 0.5 (*)    All other components within normal limits  COMPREHENSIVE METABOLIC PANEL - Abnormal; Notable for the following:    Sodium 128 (*)    Potassium 5.2 (*)    Chloride 85 (*) DELTA CHECK NOTED   Glucose, Bld 115 (*)    BUN 86 (*)    Creatinine, Ser 8.74 (*)  Albumin 2.0 (*)    Alkaline Phosphatase 282 (*)    GFR calc non Af Amer 4 (*)    GFR calc Af Amer 5 (*)    All other components within normal limits  LIPASE, BLOOD - Abnormal; Notable for the following:    Lipase 10 (*)    All other components within normal limits  GLUCOSE, CAPILLARY - Abnormal; Notable for the following:    Glucose-Capillary 112 (*)    All other components within normal limits  POCT I-STAT, CHEM 8 - Abnormal; Notable for the following:    Sodium 125 (*)    Potassium 6.5 (*)    BUN 108 (*)    Creatinine, Ser 9.30 (*)    Glucose, Bld 114 (*)    Calcium, Ion 1.11 (*)    Hemoglobin 11.9 (*)    HCT 35.0 (*)    All other components within normal limits  POTASSIUM - Abnormal; Notable for the following:    Potassium 5.3 (*) DELTA CHECK NOTED   All other components within normal limits  BILIRUBIN, DIRECT  URINALYSIS, ROUTINE W REFLEX MICROSCOPIC   No results found.   No diagnosis found.    MDM  12 midnight CT shows new pseudocyst to the pancreas.  Patient is requiring frequent dilaudid IV for her pain.  Acute pain since last pm.  No nausea and vomiting.  Peritoneal dialysis patient.  Last dialysis was 4/19.  Potasium 5.2, na 128.  Pt will be admitted per hospitalist team 9.  kayexolate 30gm  Po.     Labs Reviewed  CBC - Abnormal; Notable for the following:    WBC 23.8 (*)    RBC 3.05 (*)    Hemoglobin 8.8 (*) DELTA CHECK NOTED   HCT 26.5 (*)    RDW 18.8 (*)    Platelets 121 (*)    All other components within normal limits  DIFFERENTIAL - Abnormal; Notable for the following:      Neutrophils Relative 94 (*)    Lymphocytes Relative 2 (*)    Neutro Abs 22.3 (*)    Lymphs Abs 0.5 (*)    All other components within normal limits  COMPREHENSIVE METABOLIC PANEL - Abnormal; Notable for the following:    Sodium 128 (*)    Potassium 5.2 (*)    Chloride 85 (*) DELTA CHECK NOTED   Glucose, Bld 115 (*)    BUN 86 (*)    Creatinine, Ser 8.74 (*)    Albumin 2.0 (*)    Alkaline Phosphatase 282 (*)    GFR calc non Af Amer 4 (*)    GFR calc Af Amer 5 (*)    All other components within normal limits  LIPASE, BLOOD - Abnormal; Notable for the following:    Lipase 10 (*)    All other components within normal limits  URINALYSIS, ROUTINE W REFLEX MICROSCOPIC - Abnormal; Notable for the following:    Color, Urine AMBER (*) BIOCHEMICALS MAY BE AFFECTED BY COLOR   APPearance CLOUDY (*)    Bilirubin Urine SMALL (*)    Ketones, ur 15 (*)    Protein, ur 100 (*)    Leukocytes, UA SMALL (*)    All other components within normal limits  GLUCOSE, CAPILLARY - Abnormal; Notable for the following:    Glucose-Capillary 112 (*)    All other components within normal limits  POCT I-STAT, CHEM 8 - Abnormal; Notable for the following:    Sodium 125 (*)    Potassium 6.5 (*)  BUN 108 (*)    Creatinine, Ser 9.30 (*)    Glucose, Bld 114 (*)    Calcium, Ion 1.11 (*)    Hemoglobin 11.9 (*)    HCT 35.0 (*)    All other components within normal limits  POTASSIUM - Abnormal; Notable for the following:    Potassium 5.3 (*) DELTA CHECK NOTED   All other components within normal limits  URINE MICROSCOPIC-ADD ON - Abnormal; Notable for the following:    Bacteria, UA MANY (*)    Casts HYALINE CASTS (*)    All other components within normal limits  BILIRUBIN, DIRECT       Remi Haggard, NP 10/04/11 0040

## 2011-10-03 NOTE — ED Notes (Signed)
Pt is aware that we need to collect urine specimen. Pt States that she does not have urge to go.

## 2011-10-03 NOTE — ED Notes (Signed)
Pt was brought in by EMS with c/o abdominal pain and back pain onset last night. Pt denies any nausea, vomiting, diarrhea nor fever. Pt does peritoneal dialysis at home and not done it since Thursday.

## 2011-10-03 NOTE — ED Notes (Signed)
Patient transported to CT 

## 2011-10-03 NOTE — ED Notes (Signed)
Abnormal labs where given to Dr. Davidson 

## 2011-10-03 NOTE — ED Notes (Signed)
Pt was received to POD with c/i abdominal pain and back pain onset last night. Pt is on peritoneal dialysis at home. Pt claimed that she has not done her dialysis since Thursday because she has been weak and not feeling well. Pt is A/A/Ox4,

## 2011-10-04 ENCOUNTER — Encounter (HOSPITAL_COMMUNITY): Payer: Self-pay | Admitting: Internal Medicine

## 2011-10-04 DIAGNOSIS — K861 Other chronic pancreatitis: Secondary | ICD-10-CM | POA: Diagnosis present

## 2011-10-04 DIAGNOSIS — R1013 Epigastric pain: Secondary | ICD-10-CM | POA: Diagnosis present

## 2011-10-04 DIAGNOSIS — E875 Hyperkalemia: Secondary | ICD-10-CM | POA: Diagnosis present

## 2011-10-04 DIAGNOSIS — E871 Hypo-osmolality and hyponatremia: Secondary | ICD-10-CM | POA: Diagnosis present

## 2011-10-04 HISTORY — DX: Other chronic pancreatitis: K86.1

## 2011-10-04 LAB — GLUCOSE, CAPILLARY
Glucose-Capillary: 112 mg/dL — ABNORMAL HIGH (ref 70–99)
Glucose-Capillary: 79 mg/dL (ref 70–99)
Glucose-Capillary: 124 mg/dL — ABNORMAL HIGH (ref 70–99)

## 2011-10-04 LAB — CBC
HCT: 26.3 % — ABNORMAL LOW (ref 36.0–46.0)
HCT: 24.8 % — ABNORMAL LOW (ref 36.0–46.0)
Hemoglobin: 8.7 g/dL — ABNORMAL LOW (ref 12.0–15.0)
MCH: 29.1 pg (ref 26.0–34.0)
MCHC: 33.1 g/dL (ref 30.0–36.0)
MCHC: 33.1 g/dL (ref 30.0–36.0)
MCV: 88 fL (ref 78.0–100.0)
RDW: 18.8 % — ABNORMAL HIGH (ref 11.5–15.5)
RDW: 18.8 % — ABNORMAL HIGH (ref 11.5–15.5)

## 2011-10-04 LAB — DIFFERENTIAL
Basophils Absolute: 0 10*3/uL (ref 0.0–0.1)
Basophils Relative: 0 % (ref 0–1)
Eosinophils Absolute: 0 10*3/uL (ref 0.0–0.7)
Lymphs Abs: 0.7 10*3/uL (ref 0.7–4.0)
Neutro Abs: 20.3 10*3/uL — ABNORMAL HIGH (ref 1.7–7.7)

## 2011-10-04 LAB — BASIC METABOLIC PANEL
BUN: 89 mg/dL — ABNORMAL HIGH (ref 6–23)
Creatinine, Ser: 8.76 mg/dL — ABNORMAL HIGH (ref 0.50–1.10)
GFR calc Af Amer: 5 mL/min — ABNORMAL LOW (ref >90)
GFR calc non Af Amer: 4 mL/min — ABNORMAL LOW (ref >90)

## 2011-10-04 LAB — HEMOGLOBIN A1C: Hgb A1c MFr Bld: 5.7 % — ABNORMAL HIGH (ref <5.7)

## 2011-10-04 MED ORDER — SODIUM CHLORIDE 0.9 % IV SOLN
3.0000 g | Freq: Every day | INTRAVENOUS | Status: DC
  Administered 2011-10-04 – 2011-10-06 (×4): 3 g via INTRAVENOUS
  Filled 2011-10-04 (×5): qty 3

## 2011-10-04 MED ORDER — PANTOPRAZOLE SODIUM 40 MG IV SOLR
40.0000 mg | INTRAVENOUS | Status: DC
  Administered 2011-10-04 – 2011-10-09 (×6): 40 mg via INTRAVENOUS
  Filled 2011-10-04 (×7): qty 40

## 2011-10-04 MED ORDER — ACETAMINOPHEN 650 MG RE SUPP: 650.0000 mg | Freq: Four times a day (QID) | RECTAL | Status: DC | PRN

## 2011-10-04 MED ORDER — ONDANSETRON HCL 4 MG PO TABS: 4.0000 mg | ORAL_TABLET | Freq: Four times a day (QID) | ORAL | Status: DC | PRN

## 2011-10-04 MED ORDER — ZOLPIDEM TARTRATE 5 MG PO TABS: 5.0000 mg | ORAL_TABLET | Freq: Every evening | ORAL | Status: DC | PRN

## 2011-10-04 MED ORDER — CARVEDILOL 6.25 MG PO TABS
6.2500 mg | ORAL_TABLET | Freq: Two times a day (BID) | ORAL | Status: DC
  Administered 2011-10-04 – 2011-10-09 (×9): 6.25 mg via ORAL
  Filled 2011-10-04 (×14): qty 1

## 2011-10-04 MED ORDER — RANOLAZINE ER 500 MG PO TB12
500.0000 mg | ORAL_TABLET | Freq: Every day | ORAL | Status: DC
  Administered 2011-10-04 – 2011-10-14 (×8): 500 mg via ORAL
  Filled 2011-10-04 (×11): qty 1

## 2011-10-04 MED ORDER — ACETAMINOPHEN 325 MG PO TABS
650.0000 mg | ORAL_TABLET | Freq: Four times a day (QID) | ORAL | Status: DC | PRN
  Administered 2011-10-08 – 2011-10-13 (×3): 650 mg via ORAL
  Filled 2011-10-04 (×2): qty 2

## 2011-10-04 MED ORDER — HYDROMORPHONE HCL PF 1 MG/ML IJ SOLN: 1.0000 mg | INTRAMUSCULAR | Status: DC | PRN

## 2011-10-04 MED ORDER — FENTANYL 50 MCG/HR TD PT72
50.0000 ug | MEDICATED_PATCH | TRANSDERMAL | Status: DC
  Administered 2011-10-04: 50 ug via TRANSDERMAL
  Filled 2011-10-04: qty 1

## 2011-10-04 MED ORDER — DOXAZOSIN MESYLATE 2 MG PO TABS
2.0000 mg | ORAL_TABLET | Freq: Every day | ORAL | Status: DC
  Administered 2011-10-04 – 2011-10-07 (×3): 2 mg via ORAL
  Filled 2011-10-04 (×6): qty 1

## 2011-10-04 MED ORDER — INSULIN ASPART 100 UNIT/ML ~~LOC~~ SOLN
0.0000 [IU] | SUBCUTANEOUS | Status: DC
  Administered 2011-10-04: 1 [IU] via SUBCUTANEOUS
  Administered 2011-10-05: 2 [IU] via SUBCUTANEOUS
  Administered 2011-10-05 (×2): 1 [IU] via SUBCUTANEOUS
  Administered 2011-10-06: 3 [IU] via SUBCUTANEOUS
  Administered 2011-10-06 (×3): 1 [IU] via SUBCUTANEOUS

## 2011-10-04 MED ORDER — HYDROMORPHONE HCL PF 1 MG/ML IJ SOLN
2.0000 mg | INTRAMUSCULAR | Status: DC | PRN
  Administered 2011-10-04: 1 mg via INTRAVENOUS
  Administered 2011-10-04 (×2): 2 mg via INTRAVENOUS
  Administered 2011-10-04: 1 mg via INTRAVENOUS
  Administered 2011-10-04 – 2011-10-06 (×7): 2 mg via INTRAVENOUS
  Administered 2011-10-07: 1 mg via INTRAVENOUS
  Administered 2011-10-07 (×2): 2 mg via INTRAVENOUS
  Administered 2011-10-07: 1 mg via INTRAVENOUS
  Administered 2011-10-07 – 2011-10-10 (×5): 2 mg via INTRAVENOUS
  Filled 2011-10-04 (×5): qty 2
  Filled 2011-10-04: qty 1
  Filled 2011-10-04 (×8): qty 2
  Filled 2011-10-04 (×3): qty 1
  Filled 2011-10-04 (×3): qty 2

## 2011-10-04 MED ORDER — SIMVASTATIN 10 MG PO TABS
10.0000 mg | ORAL_TABLET | Freq: Every day | ORAL | Status: DC
  Administered 2011-10-04 – 2011-10-11 (×7): 10 mg via ORAL
  Filled 2011-10-04 (×11): qty 1

## 2011-10-04 MED ORDER — SODIUM CHLORIDE 0.9 % IJ SOLN
3.0000 mL | Freq: Two times a day (BID) | INTRAMUSCULAR | Status: DC
  Administered 2011-10-04 – 2011-10-14 (×13): 3 mL via INTRAVENOUS

## 2011-10-04 MED ORDER — ONDANSETRON HCL 4 MG/2ML IJ SOLN
4.0000 mg | Freq: Four times a day (QID) | INTRAMUSCULAR | Status: DC | PRN
  Administered 2011-10-04: 4 mg via INTRAVENOUS
  Filled 2011-10-04: qty 2

## 2011-10-04 MED ORDER — DAILY VITAMIN PO TABS: 1.0000 | ORAL_TABLET | Freq: Every day | ORAL | Status: DC

## 2011-10-04 MED ORDER — RENA-VITE PO TABS
1.0000 | ORAL_TABLET | Freq: Every day | ORAL | Status: DC
  Administered 2011-10-04 – 2011-10-09 (×6): 1 via ORAL
  Filled 2011-10-04 (×10): qty 1

## 2011-10-04 MED ORDER — DARBEPOETIN ALFA-POLYSORBATE 40 MCG/0.4ML IJ SOLN
40.0000 ug | INTRAMUSCULAR | Status: DC
  Administered 2011-10-04 – 2011-10-11 (×2): 40 ug via SUBCUTANEOUS
  Filled 2011-10-04 (×2): qty 0.4

## 2011-10-04 MED ORDER — SODIUM CHLORIDE 0.9 % IV SOLN
INTRAVENOUS | Status: DC
  Administered 2011-10-04: 1000 mL via INTRAVENOUS
  Administered 2011-10-06: 02:00:00 via INTRAVENOUS

## 2011-10-04 MED ORDER — BOOST / RESOURCE BREEZE PO LIQD
1.0000 | Freq: Three times a day (TID) | ORAL | Status: DC
  Administered 2011-10-04 – 2011-10-10 (×17): 1 via ORAL

## 2011-10-04 MED ORDER — SEVELAMER HCL 800 MG PO TABS
3200.0000 mg | ORAL_TABLET | Freq: Three times a day (TID) | ORAL | Status: DC
  Administered 2011-10-05 – 2011-10-07 (×6): 3200 mg via ORAL
  Filled 2011-10-04 (×17): qty 4

## 2011-10-04 NOTE — ED Notes (Signed)
RN not available to take report at this time.  Will call back

## 2011-10-04 NOTE — ED Notes (Signed)
Called and gave [place]

## 2011-10-04 NOTE — H&P (Addendum)
DATE OF ADMISSION:  10/04/2011  PCP:  [facility]   No primary provider on file.   Chief Complaint: ABD Pain   HPI: [name] is an 61 y.o. female with multiple medical problems including ischemic Cardiomyopathy and ESRD on peritoneal dialysis who presents to the ED with complaints of worsening epigastric ABD Pain.  She was evaluated in the Ed and on CT scan withuot contrast was found to have a pancreatic pseudocyst which is larger than on recent previous CT scans.  She is unable to give a history at this time due to the medication.  She is afebrile at this time and was referred for admission due to difficulty controlling her pain, and electrolyte abnormalities as well.  Her potassium level was 6.5 in the ED and she was given 30 grams of Kayexalate PO X 1, and she was also found to have hyponatremia with a sodium level of 128.     Past Medical History  Diagnosis Date  . Gallstone pancreatitis   . Coronary artery disease     s/p CABG 2008 with multiple PCIs  . Ischemic cardiomyopathy   . End stage renal disease   . Diabetes mellitus   . Hypertension   . History of nonadherence to medical treatment   . LBBB (left bundle branch block)   . Hyperlipidemia   . Renal insufficiency   . CHF (congestive heart failure)   . Dialysis patient   . Anemia         Diverticulosis  Past Surgical History  Procedure Date  . Coronary artery bypass graft 2008  . Cholecystectomy   . Abdominal hysterectomy   . Tubal ligation   . Tonsillectomy   . Insert / replace / remove pacemaker     pacemaker ICD  . Esophagogastroduodenoscopy 09/15/2011    Procedure: ESOPHAGOGASTRODUODENOSCOPY (EGD);  Surgeon: Theda Belfast, MD;  Location: [facility] ENDOSCOPY;  Service: Endoscopy;  Laterality: N/A;    Medications:  HOME MEDS: Prior to Admission medications   Medication Sig Start Date End Date Taking? Authorizing Provider  carvedilol (COREG) 6.25 MG tablet Take 6.25 mg by mouth 2 (two) times daily with a meal. 09/20/11  09/19/12 Yes Ardyth Gal, MD  doxazosin (CARDURA) 2 MG tablet Take 2 mg by mouth at bedtime. 09/20/11 09/19/12 Yes Ardyth Gal, MD  fentaNYL (DURAGESIC - DOSED MCG/HR) 50 MCG/HR Place 1 patch onto the skin every 3 (three) days. 09/20/11 10/20/11 Yes Ardyth Gal, MD  insulin glargine (LANTUS) 100 UNIT/ML injection Inject 10 Units into the skin daily as needed. Per sliding scale   Yes Historical Provider, MD  Multiple Vitamin (DAILY VITAMIN PO) Take 1 tablet by mouth daily.    Yes Historical Provider, MD  pravastatin (PRAVACHOL) 20 MG tablet Take 20 mg by mouth daily.     Yes Historical Provider, MD  ranolazine (RANEXA) 500 MG 12 hr tablet Take 500 mg by mouth daily.     Yes Historical Provider, MD  sevelamer (RENAGEL) 800 MG tablet Take 3,200 mg by mouth 3 (three) times daily with meals.    Yes Historical Provider, MD    Allergies:  Allergies  Allergen Reactions  . Morphine And Related Anaphylaxis  . Codeine Itching  . Lisinopril     Language changed    Social History:   reports that she quit smoking about 11 years ago. She does not have any smokeless tobacco history on file. She reports that she does not drink alcohol or use illicit drugs.  Family History:  Family History  Problem Relation Age of Onset  . Coronary artery disease Mother   . Hypertension Mother   . Diabetes Mother   . Diabetes Sister   . Anesthesia problems Neg Hx     Review of Systems:  The patient denies anorexia, fever, weight loss,, vision loss, decreased hearing, hoarseness, chest pain, syncope, dyspnea on exertion, peripheral edema, balance deficits, hemoptysis, abdominal pain, melena, hematochezia, severe indigestion/heartburn, hematuria, incontinence, genital sores, muscle weakness, suspicious skin lesions, transient blindness, difficulty walking, depression, unusual weight change, abnormal bleeding, enlarged lymph nodes, angioedema, and breast masses.   Physical Exam:  GEN:   Thin African  American female examined  and in discomfort but no acute distress  Filed Vitals:   10/03/11 2115 10/03/11 2130 10/03/11 2328 10/03/11 2353  BP: 114/81 122/83 117/76 115/72  Pulse: 94 96 103 108  Temp:      TempSrc:      Resp: 18 15 16 18   Height:      Weight:      SpO2: 100% 98% 98% 99%   Blood pressure 115/72, pulse 108, temperature 97.7 F (36.5 C), temperature source Oral, resp. rate 18, height 5\' 4"  (1.626 m), weight 61.689 kg (136 lb), SpO2 99.00%. PSYCH: She is alert and oriented x1;  HEENT: Normocephalic and Atraumatic, Mucous membranes pink; PERRLA; EOM intact; Fundi:  Benign;  No scleral icterus, Nares: Patent, Oropharynx: Clear,  Neck:  FROM, no cervical lymphadenopathy nor thyromegaly or carotid bruit; no JVD; Breasts:: Not examined CHEST WALL: No tenderness CHEST: Normal respiration, clear to auscultation bilaterally HEART: Regular rate and rhythm; no murmurs rubs or gallops BACK: No kyphosis or scoliosis; no CVA tenderness ABDOMEN: Positive Bowel Sounds, Scaphoid,  soft mildly tender epigastrium no masses, no organomegaly.   Rectal Exam: Not done EXTREMITIES: No bone or joint deformity; age-appropriate arthropathy of the hands and knees; no cyanosis, clubbing or edema; no ulcerations. Genitalia: not examined PULSES: 2+ and symmetric SKIN: Normal hydration no rash or ulceration CNS: Cranial nerves 2-12 grossly intact no focal neurologic deficit   Labs & Imaging Results for orders placed during the hospital encounter of 10/03/11 (from the past 48 hour(s))  GLUCOSE, CAPILLARY     Status: Abnormal   Collection Time   10/03/11  6:26 PM      Component Value Range Comment   Glucose-Capillary 112 (*) 70 - 99 (mg/dL)    Comment 1 Documented in Chart      Comment 2 Notify RN     POCT I-STAT, CHEM 8     Status: Abnormal   Collection Time   10/03/11  6:40 PM      Component Value Range Comment   Sodium 125 (*) 135 - 145 (mEq/L)    Potassium 6.5 (*) 3.5 - 5.1 (mEq/L)     Chloride 97  96 - 112 (mEq/L)    BUN 108 (*) 6 - 23 (mg/dL)    Creatinine, Ser 9.14 (*) 0.50 - 1.10 (mg/dL)    Glucose, Bld 782 (*) 70 - 99 (mg/dL)    Calcium, Ion 9.56 (*) 1.12 - 1.32 (mmol/L)    TCO2 25  0 - 100 (mmol/L)    Hemoglobin 11.9 (*) 12.0 - 15.0 (g/dL)    HCT 21.3 (*) 08.6 - 46.0 (%)    Comment NOTIFIED PHYSICIAN     CBC     Status: Abnormal   Collection Time   10/03/11  6:51 PM      Component Value Range Comment   WBC  23.8 (*) 4.0 - 10.5 (K/uL)    RBC 3.05 (*) 3.87 - 5.11 (MIL/uL)    Hemoglobin 8.8 (*) 12.0 - 15.0 (g/dL) DELTA CHECK NOTED   HCT 26.5 (*) 36.0 - 46.0 (%)    MCV 86.9  78.0 - 100.0 (fL)    MCH 28.9  26.0 - 34.0 (pg)    MCHC 33.2  30.0 - 36.0 (g/dL)    RDW 40.9 (*) 81.1 - 15.5 (%)    Platelets 121 (*) 150 - 400 (K/uL)   DIFFERENTIAL     Status: Abnormal   Collection Time   10/03/11  6:51 PM      Component Value Range Comment   Neutrophils Relative 94 (*) 43 - 77 (%)    Lymphocytes Relative 2 (*) 12 - 46 (%)    Monocytes Relative 4  3 - 12 (%)    Eosinophils Relative 0  0 - 5 (%)    Basophils Relative 0  0 - 1 (%)    Neutro Abs 22.3 (*) 1.7 - 7.7 (K/uL)    Lymphs Abs 0.5 (*) 0.7 - 4.0 (K/uL)    Monocytes Absolute 1.0  0.1 - 1.0 (K/uL)    Eosinophils Absolute 0.0  0.0 - 0.7 (K/uL)    Basophils Absolute 0.0  0.0 - 0.1 (K/uL)    RBC Morphology POLYCHROMASIA PRESENT     COMPREHENSIVE METABOLIC PANEL     Status: Abnormal   Collection Time   10/03/11  6:51 PM      Component Value Range Comment   Sodium 128 (*) 135 - 145 (mEq/L)    Potassium 5.2 (*) 3.5 - 5.1 (mEq/L)    Chloride 85 (*) 96 - 112 (mEq/L) DELTA CHECK NOTED   CO2 25  19 - 32 (mEq/L)    Glucose, Bld 115 (*) 70 - 99 (mg/dL)    BUN 86 (*) 6 - 23 (mg/dL)    Creatinine, Ser 9.14 (*) 0.50 - 1.10 (mg/dL)    Calcium 78.2  8.4 - 10.5 (mg/dL)    Total Protein 6.3  6.0 - 8.3 (g/dL)    Albumin 2.0 (*) 3.5 - 5.2 (g/dL)    AST 19  0 - 37 (U/L)    ALT 18  0 - 35 (U/L)    Alkaline Phosphatase 282 (*) 39  - 117 (U/L)    Total Bilirubin 0.3  0.3 - 1.2 (mg/dL)    GFR calc non Af Amer 4 (*) >90 (mL/min)    GFR calc Af Amer 5 (*) >90 (mL/min)   LIPASE, BLOOD     Status: Abnormal   Collection Time   10/03/11  6:51 PM      Component Value Range Comment   Lipase 10 (*) 11 - 59 (U/L)   BILIRUBIN, DIRECT     Status: Normal   Collection Time   10/03/11  6:51 PM      Component Value Range Comment   Bilirubin, Direct 0.2  0.0 - 0.3 (mg/dL)   POTASSIUM     Status: Abnormal   Collection Time   10/03/11  6:52 PM      Component Value Range Comment   Potassium 5.3 (*) 3.5 - 5.1 (mEq/L) DELTA CHECK NOTED  URINALYSIS, ROUTINE W REFLEX MICROSCOPIC     Status: Abnormal   Collection Time   10/03/11  9:32 PM      Component Value Range Comment   Color, Urine AMBER (*) YELLOW  BIOCHEMICALS MAY BE AFFECTED BY COLOR   APPearance CLOUDY (*)  CLEAR     Specific Gravity, Urine 1.024  1.005 - 1.030     pH 5.0  5.0 - 8.0     Glucose, UA NEGATIVE  NEGATIVE (mg/dL)    Hgb urine dipstick NEGATIVE  NEGATIVE     Bilirubin Urine SMALL (*) NEGATIVE     Ketones, ur 15 (*) NEGATIVE (mg/dL)    Protein, ur 161 (*) NEGATIVE (mg/dL)    Urobilinogen, UA 0.2  0.0 - 1.0 (mg/dL)    Nitrite NEGATIVE  NEGATIVE     Leukocytes, UA SMALL (*) NEGATIVE    URINE MICROSCOPIC-ADD ON     Status: Abnormal   Collection Time   10/03/11  9:32 PM      Component Value Range Comment   Squamous Epithelial / LPF RARE  RARE     WBC, UA 0-2  <3 (WBC/hpf)    Bacteria, UA MANY (*) RARE     Casts HYALINE CASTS (*) NEGATIVE     Ct Abdomen Pelvis Wo Contrast  10/03/2011  *RADIOLOGY REPORT*  Clinical Data: Abdominal/back pain  CT ABDOMEN AND PELVIS WITHOUT CONTRAST  Technique:  Multidetector CT imaging of the abdomen and pelvis was performed following the standard protocol without intravenous contrast.  Comparison: 09/13/2011  Findings: Patchy opacity / atelectasis at the lung bases.  Cardiomegaly.  Unenhanced liver, spleen, and adrenal glands are within  normal limits.  Pancreas is poorly evaluated in the absence of intravenous contrast administration. Suspected 1.5 x 1.9 cm fluid collection containing a tiny focus of gas anterior to the pancreatic body (series 2/image 34), possibly reflecting a pseudocyst, improved. Additional 5.4 x 3.9 cm suspected complex fluid collection adjacent to the pancreatic tail (series 2/image 32), with adjacent scattered foci of gas medially (series 2/images 31 and 33), likely reflecting a worsening pseudocyst.  Unenhanced kidneys are notable for small probable cysts. Small punctate nonobstructing left lower pole calculus (series 2/image 47).  No hydronephrosis.  No evidence of bowel obstruction.  Normal appendix.  Colonic diverticulosis, without associated inflammatory changes.  Atherosclerotic calcifications of the abdominal aorta and branch vessels.  Peritoneal dialysis catheter in the left abdomen. Small to moderate abdominopelvic ascites.  No suspicious abdominopelvic lymphadenopathy.  Status post hysterectomy.  No adnexal masses.  Bladder is underdistended.  Visualized osseous structures are within normal limits.  IMPRESSION: Pancreas is poorly evaluated in the absence of intravenous contrast administration.  Suspected 1.5 x 1.9 cm fluid collection anterior to the pancreatic body, improved.  Suspected 5.4 x 3.9 cm complex fluid collection adjacent to the pancreatic tail, increased.  Both likely reflect pancreatic pseudocysts and are notable for tiny foci of gas, correlate for intervention.  No evidence of bowel obstruction.  Normal appendix.  Colonic diverticulosis, without associated inflammatory changes.  Peritoneal dialysis catheter in the left abdomen.  Small to moderate abdominopelvic ascites.  Original Report Authenticated By: Charline Bills, M.D.    Assessment: Present on Admission:  .Epigastric abdominal pain .Pancreatic pseudocyst .End stage renal disease .Ischemic cardiomyopathy .Chronic systolic congestive  heart failure .LBBB (left bundle branch block) .Pancreatitis chronic  Hyponatremia  Hyperkalemia  Diabetes mellitis  Plan:   Admit to telemetry Bed Pain control with IV Dilaudid PRN Antiemetics PRN Consider Interventional radiology evaluation for Drainage of the Pseudocyst if possible. Notify the Dialysis Team for Peritoneal Dialysis continuation Kayexalate given X 1, recheck in 6 hours and repeat X 1 if needed.   Reconcile Home meds.   SSI coverage.   DVT prophylaxis Other plans as per orders.  CODE STATUS:      FULL CODE       ADDENDUM:              -Added Blood Cultures X 2 and Culture of Peritoneal dialysate due to decrease temp and Leukocytosis, though the leukocytosis may be a stress leukocytosis, and her ABD examination was negative for peritoneal signs.               -Also a repeat CBC was performed due to comparison of an istat H/H to the CBC which was done in the lab which revealed a possible 3 gram drop in 10 minutes, and the repeat CBC was consistent with the previous CBC doe in the lab.        Heloise Gordan C 10/04/2011, 12:17 AM

## 2011-10-04 NOTE — Progress Notes (Signed)
Pt arrived  To floor  Via  Stretcher , alert  Oriented to  Self and  Situation  .   Placed  On  Tele # [phone]    Denied pain at present time  But  Grimaces  When abdomen  Palpated.   PD  Cath intact and  Capped  .   Fluid  To  Be  Obtained  For  Culture  Spec  As  ordered

## 2011-10-04 NOTE — Consult Note (Signed)
[facility] Renal Consultation Note    Indication for Consultation:  Management of ESRD/hemodialysis; anemia, hypertension/volume and secondary hyperparathyroidism  HPI: [name] is a 61 y.o. female.with ESRD presumed secondary to DM and HTN who reportedly receives her care at the [facility].  She was recently discharged following an admission for pancreatic psuedocyst 4/05 with a prior admission at the [facility] for hematemesis in early March of this year. She presented yesterday to the ED with worsening abdominal pain. She was apparently able to give a history at that time, but is presently quite confused and unable to answer hardly any questions.  She cannot state where she is or when she last did her peritoneal dialysis.  Lab work with low NA, ^ K ^ BUN suggest that it has been a while. She is dozing off, but arousible, though cannot answer questions.  She mostly says "oww" during exam, though she did say she lived alone.  Dialysis Orders: PD unable to obtain previous order from patient.    Past Medical History  Diagnosis Date  . Gallstone pancreatitis   . Coronary artery disease     s/p CABG 2008 with multiple PCIs  . Ischemic cardiomyopathy   . End stage renal disease   . Diabetes mellitus   . Hypertension   . History of nonadherence to medical treatment   . LBBB (left bundle branch block)   . Hyperlipidemia   . Renal insufficiency   . CHF (congestive heart failure)   . Dialysis patient   . Anemia    Past Surgical History  Procedure Date  . Coronary artery bypass graft 2008  . Cholecystectomy   . Abdominal hysterectomy   . Tubal ligation   . Tonsillectomy   . Insert / replace / remove pacemaker     pacemaker ICD  . Esophagogastroduodenoscopy 09/15/2011    Procedure: ESOPHAGOGASTRODUODENOSCOPY (EGD);  Surgeon: Theda Belfast, MD;  Location: [facility] ENDOSCOPY;  Service: Endoscopy;  Laterality: N/A;   Family History  Problem Relation Age of Onset  . Coronary artery  disease Mother   . Hypertension Mother   . Diabetes Mother   . Diabetes Sister   . Anesthesia problems Neg Hx    Social History:  reports that she quit smoking about 11 years ago. She does not have any smokeless tobacco history on file. She reports that she does not drink alcohol or use illicit drugs. Allergies  Allergen Reactions  . Morphine And Related Anaphylaxis  . Codeine Itching  . Lisinopril     Language changed   Prior to Admission medications   Medication Sig Start Date End Date Taking? Authorizing Provider  carvedilol (COREG) 6.25 MG tablet Take 6.25 mg by mouth 2 (two) times daily with a meal. 09/20/11 09/19/12 Yes Ardyth Gal, MD  doxazosin (CARDURA) 2 MG tablet Take 2 mg by mouth at bedtime. 09/20/11 09/19/12 Yes Ardyth Gal, MD  fentaNYL (DURAGESIC - DOSED MCG/HR) 50 MCG/HR Place 1 patch onto the skin every 3 (three) days. 09/20/11 10/20/11 Yes Ardyth Gal, MD  insulin glargine (LANTUS) 100 UNIT/ML injection Inject 10 Units into the skin daily as needed. Per sliding scale   Yes Historical Provider, MD  Multiple Vitamin (DAILY VITAMIN PO) Take 1 tablet by mouth daily.    Yes Historical Provider, MD  pravastatin (PRAVACHOL) 20 MG tablet Take 20 mg by mouth daily.     Yes Historical Provider, MD  ranolazine (RANEXA) 500 MG 12 hr tablet Take 500 mg by mouth daily.  Yes Historical Provider, MD  sevelamer (RENAGEL) 800 MG tablet Take 3,200 mg by mouth 3 (three) times daily with meals.    Yes Historical Provider, MD   Current Facility-Administered Medications  Medication Dose Route Frequency Provider Last Rate Last Dose  . 0.9 %  sodium chloride infusion   Intravenous Continuous Ron Parker, MD 50 mL/hr at 10/04/11 0420 1,000 mL at 10/04/11 0420  . acetaminophen (TYLENOL) tablet 650 mg  650 mg Oral Q6H PRN Ron Parker, MD       Or  . acetaminophen (TYLENOL) suppository 650 mg  650 mg Rectal Q6H PRN Ron Parker, MD      . Ampicillin-Sulbactam  (UNASYN) 3 g in sodium chloride 0.9 % 100 mL IVPB  3 g Intravenous QHS Rhetta Mura, MD   3 g at 10/04/11 0414  . carvedilol (COREG) tablet 6.25 mg  6.25 mg Oral BID WC Ron Parker, MD   6.25 mg at 10/04/11 1610  . doxazosin (CARDURA) tablet 2 mg  2 mg Oral QHS Harvette C Jenkins, MD      . fentaNYL (DURAGESIC - dosed mcg/hr) 50 mcg  50 mcg Transdermal Q72H Ron Parker, MD   50 mcg at 10/04/11 9604  . HYDROmorphone (DILAUDID) injection 1 mg  1 mg Intravenous Once Remi Haggard, NP   1 mg at 10/03/11 1906  . HYDROmorphone (DILAUDID) injection 1 mg  1 mg Intravenous Once Remi Haggard, NP   1 mg at 10/03/11 2014  . HYDROmorphone (DILAUDID) injection 1 mg  1 mg Intravenous Once Remi Haggard, NP   1 mg at 10/03/11 2343  . HYDROmorphone (DILAUDID) injection 2 mg  2 mg Intravenous Q3H PRN Ron Parker, MD   2 mg at 10/04/11 1307  . insulin aspart (novoLOG) injection 0-9 Units  0-9 Units Subcutaneous Q4H Harvette C Jenkins, MD      . metoCLOPramide (REGLAN) injection 10 mg  10 mg Intravenous Once Marvetta Gibbons, MD   10 mg at 10/03/11 2342  . multivitamin (RENA-VIT) tablet 1 tablet  1 tablet Oral QHS Rhetta Mura, MD      . ondansetron (ZOFRAN) injection 4 mg  4 mg Intravenous Once Remi Haggard, NP   4 mg at 10/03/11 1906  . ondansetron (ZOFRAN) tablet 4 mg  4 mg Oral Q6H PRN Ron Parker, MD       Or  . ondansetron (ZOFRAN) injection 4 mg  4 mg Intravenous Q6H PRN Ron Parker, MD   4 mg at 10/04/11 0415  . pantoprazole (PROTONIX) injection 40 mg  40 mg Intravenous Q24H Ron Parker, MD   40 mg at 10/04/11 0749  . ranolazine (RANEXA) 12 hr tablet 500 mg  500 mg Oral Daily Ron Parker, MD   500 mg at 10/04/11 1038  . sevelamer (RENAGEL) tablet 3,200 mg  3,200 mg Oral TID WC Harvette Velora Heckler, MD      . simvastatin (ZOCOR) tablet 10 mg  10 mg Oral q1800 Harvette Velora Heckler, MD      . sodium chloride 0.9 % injection 3 mL  3 mL Intravenous Q12H  Ron Parker, MD   3 mL at 10/04/11 1039  . sodium polystyrene (KAYEXALATE) 15 GM/60ML suspension 30 g  30 g Oral Once Marvetta Gibbons, MD   30 g at 10/03/11 2116  . zolpidem (AMBIEN) tablet 5 mg  5 mg Oral QHS PRN Ron Parker, MD      . DISCONTD: Daily  Vitamin 1 tablet  1 tablet Oral Daily Ron Parker, MD      . DISCONTD: HYDROmorphone (DILAUDID) injection 1 mg  1 mg Intravenous Q4H PRN Remi Haggard, NP      . DISCONTD: sodium polystyrene (KAYEXALATE) 15 GM/60ML suspension 45 g  45 g Oral Once Remi Haggard, NP       Labs: Basic Metabolic Panel:  Lab 10/04/11 1610 10/03/11 1852 10/03/11 1851 10/03/11 1840  NA 132* -- 128* 125*  K 5.1 5.3* 5.2* --  CL 89* -- 85* 97  CO2 24 -- 25 --  GLUCOSE 101* -- 115* 114*  BUN 89* -- 86* 108*  CREATININE 8.76* -- 8.74* 9.30*  CALCIUM 9.5 -- 10.1 --  ALB -- -- -- --  PHOS -- -- -- --   Liver Function Tests:  Lab 10/03/11 1851  AST 19  ALT 18  ALKPHOS 282*  BILITOT 0.3  PROT 6.3  ALBUMIN 2.0*    Lab 10/03/11 1851  LIPASE 10*  AMYLASE --   No results found for this basename: AMMONIA:3 in the last 168 hours CBC:  Lab 10/04/11 0515 10/04/11 0056 10/03/11 1851  WBC 21.0* 21.7* 23.8*  NEUTROABS -- 20.3* 22.3*  HGB 8.2* 8.7* 8.8*  HCT 24.8* 26.3* 26.5*  MCV 85.5 88.0 86.9  PLT 115* 134* 121*   Cardiac Enzymes: No results found for this basename: CKTOTAL:5,CKMB:5,CKMBINDEX:5,TROPONINI:5 in the last 168 hours CBG:  Lab 10/04/11 1329 10/04/11 0407 10/03/11 1826  GLUCAP 79 112* 112*  Studies/Results: Ct Abdomen Pelvis Wo Contrast  10/03/2011  *RADIOLOGY REPORT*  Clinical Data: Abdominal/back pain  CT ABDOMEN AND PELVIS WITHOUT CONTRAST  Technique:  Multidetector CT imaging of the abdomen and pelvis was performed following the standard protocol without intravenous contrast.  Comparison: 09/13/2011  Findings: Patchy opacity / atelectasis at the lung bases.  Cardiomegaly.  Unenhanced liver, spleen, and adrenal glands are  within normal limits.  Pancreas is poorly evaluated in the absence of intravenous contrast administration. Suspected 1.5 x 1.9 cm fluid collection containing a tiny focus of gas anterior to the pancreatic body (series 2/image 34), possibly reflecting a pseudocyst, improved. Additional 5.4 x 3.9 cm suspected complex fluid collection adjacent to the pancreatic tail (series 2/image 32), with adjacent scattered foci of gas medially (series 2/images 31 and 33), likely reflecting a worsening pseudocyst.  Unenhanced kidneys are notable for small probable cysts. Small punctate nonobstructing left lower pole calculus (series 2/image 47).  No hydronephrosis.  No evidence of bowel obstruction.  Normal appendix.  Colonic diverticulosis, without associated inflammatory changes.  Atherosclerotic calcifications of the abdominal aorta and branch vessels.  Peritoneal dialysis catheter in the left abdomen. Small to moderate abdominopelvic ascites.  No suspicious abdominopelvic lymphadenopathy.  Status post hysterectomy.  No adnexal masses.  Bladder is underdistended.  Visualized osseous structures are within normal limits.  IMPRESSION: Pancreas is poorly evaluated in the absence of intravenous contrast administration.  Suspected 1.5 x 1.9 cm fluid collection anterior to the pancreatic body, improved.  Suspected 5.4 x 3.9 cm complex fluid collection adjacent to the pancreatic tail, increased.  Both likely reflect pancreatic pseudocysts and are notable for tiny foci of gas, correlate for intervention.  No evidence of bowel obstruction.  Normal appendix.  Colonic diverticulosis, without associated inflammatory changes.  Peritoneal dialysis catheter in the left abdomen.  Small to moderate abdominopelvic ascites.  Original Report Authenticated By: Charline Bills, M.D.   ROS: Unable to obtain due to confusion.  Physical Exam: Filed Vitals:  10/04/11 0200 10/04/11 0500 10/04/11 0952 10/04/11 1254  BP: 106/70 103/77 93/65 95/73     Pulse: 100 111 105 110  Temp: 98.2 F (36.8 C) 98.1 F (36.7 C) 97.8 F (36.6 C) 98 F (36.7 C)  TempSrc: Axillary Axillary Oral Oral  Resp: 16 18 18 17   Height: 5\' 4"  (1.626 m)     Weight: 61.1 kg (134 lb 11.2 oz)     SpO2: 96% 100% 96% 97%     General: Ill appearing, unkempt dozing off while holding the phone - keeps saying good bye Head: Normocephalic, atraumatic, sclera non-icteric, mucus membranes are moist; face puffy Neck: Supple.  Lungs: Limited exam; Clear bilaterally to auscultation without wheezes, rales, or rhonchi. Breathing is unlabored. Heart: RRR with S1 S2. No murmurs, rubs, or gallops appreciated. Abdomen: Soft, not significantly-tender, non-distended with normoactive bowel sounds. No rebound/guarding. No obvious abdominal masses. M-S:  Muscle wasting Lower extremities: 1 + LE edema or ischemic changes, no open wounds; tender to palpation; feet dry with thickened skin; fingernails dirty Neuro: Drowsy, speech slurred. Moves all extremities spontaneously. PsychLily Peer questions appropriately  Dialysis Access: PD cath exit clean, but dressing dirty  Assessment/Plan: 1. Abdominal pain - pancreatic pseudocyst; pain control; has marked leukocytosis which is new this admission; check cell count, though exam not typical of peritonitis; on Unasyn; consider adding Vancomycin. BC drawn, U/A not c/w UTI.  2. AMS - may be from pain meds as it appears she gave ED staff history;  though some of this could be uremia and or infection.  2. ESRD - Initiate CAPD;  K improved post kayexalate; check labs in am. 3 Hypertension/volume  - BP soft; mild generalized volume excess; needs dialysis; parameters written for dialysate bag concentration 4. Anemia  - Hgb stable compared with several weeks ago; Fe 96 with 38% sat 3/31 and ferritin 821; not clear if she received any Aranesp last admission; dose 60 units SQ weekly 5. Metabolic bone disease -[id number] 12/20 ; recent P in the 5's; not on  calcitriol; on sevelamer 6. Nutrition -poor - on CL; add CL resource supplement for protein and kcal. 7. Thrombocytopenia - follow  Sheffield Slider, PA-C [facility] Beeper [phone] 10/04/2011, 2:09 PM

## 2011-10-04 NOTE — Progress Notes (Signed)
[facility] Progress Note  10/04/2011   Subjective: Pt reporting persistent abdominal pain but overall improved and starting to eat clears.    Objective:  Vital signs in last 24 hours: Filed Vitals:   10/04/11 0045 10/04/11 0200 10/04/11 0500 10/04/11 0952  BP: 113/71 106/70 103/77 93/65  Pulse: 102 100 111 105  Temp:  98.2 F (36.8 C) 98.1 F (36.7 C) 97.8 F (36.6 C)  TempSrc:  Axillary Axillary Oral  Resp: 14 16 18 18   Height:  5\' 4"  (1.626 m)    Weight:  61.1 kg (134 lb 11.2 oz)    SpO2: 97% 96% 100% 96%   Weight change:   Intake/Output Summary (Last 24 hours) at 10/04/11 1237 Last data filed at 10/04/11 1100  Gross per 24 hour  Intake    240 ml  Output      0 ml  Net    240 ml   Lab Results  Component Value Date   HGBA1C 5.7* 10/04/2011   HGBA1C 5.2 06/03/2011   HGBA1C  Value: 4.8 (NOTE)                                                                       According to the ADA Clinical Practice Recommendations for 2011, when HbA1c is used as a screening test:   >=6.5%   Diagnostic of Diabetes Mellitus           (if abnormal result  is confirmed)  5.7-6.4%   Increased risk of developing Diabetes Mellitus  References:Diagnosis and Classification of Diabetes Mellitus,Diabetes Care,2011,34(Suppl 1):S62-S69 and Standards of Medical Care in         Diabetes - 2011,Diabetes Care,2011,34  (Suppl 1):S11-S61. 11/10/2010   Lab Results  Component Value Date   LDLCALC  Value: 56        Total Cholesterol/HDL:CHD Risk Coronary Heart Disease Risk Table                     Men   Women  1/2 Average Risk   3.4   3.3  Average Risk       5.0   4.4  2 X Average Risk   9.6   7.1  3 X Average Risk  23.4   11.0        Use the calculated Patient Ratio above and the CHD Risk Table to determine the patient's CHD Risk.        ATP III CLASSIFICATION (LDL):  <100     mg/dL   Optimal  578-469  mg/dL   Near or Above                    Optimal  130-159  mg/dL   Borderline  629-528  mg/dL   High   >413     mg/dL   Very High 2/44/0102   CREATININE 8.76* 10/04/2011    Review of Systems As above, otherwise all reviewed and reported negative  Physical Exam General - pt lethargic and confused, no distress, appears comfortable HEENT - NCAT, MM dry Lungs - BBS, shallow CV - normal s1, s2 sounds Abd - soft,  Mild distended, no masses Ext - no C/C/E  Lab Results: Results for orders placed  during the hospital encounter of 10/03/11 (from the past 24 hour(s))  GLUCOSE, CAPILLARY     Status: Abnormal   Collection Time   10/03/11  6:26 PM      Component Value Range   Glucose-Capillary 112 (*) 70 - 99 (mg/dL)   Comment 1 Documented in Chart     Comment 2 Notify RN    POCT I-STAT, CHEM 8     Status: Abnormal   Collection Time   10/03/11  6:40 PM      Component Value Range   Sodium 125 (*) 135 - 145 (mEq/L)   Potassium 6.5 (*) 3.5 - 5.1 (mEq/L)   Chloride 97  96 - 112 (mEq/L)   BUN 108 (*) 6 - 23 (mg/dL)   Creatinine, Ser 1.61 (*) 0.50 - 1.10 (mg/dL)   Glucose, Bld 096 (*) 70 - 99 (mg/dL)   Calcium, Ion 0.45 (*) 1.12 - 1.32 (mmol/L)   TCO2 25  0 - 100 (mmol/L)   Hemoglobin 11.9 (*) 12.0 - 15.0 (g/dL)   HCT 40.9 (*) 81.1 - 46.0 (%)   Comment NOTIFIED PHYSICIAN    CBC     Status: Abnormal   Collection Time   10/03/11  6:51 PM      Component Value Range   WBC 23.8 (*) 4.0 - 10.5 (K/uL)   RBC 3.05 (*) 3.87 - 5.11 (MIL/uL)   Hemoglobin 8.8 (*) 12.0 - 15.0 (g/dL)   HCT 91.4 (*) 78.2 - 46.0 (%)   MCV 86.9  78.0 - 100.0 (fL)   MCH 28.9  26.0 - 34.0 (pg)   MCHC 33.2  30.0 - 36.0 (g/dL)   RDW 95.6 (*) 21.3 - 15.5 (%)   Platelets 121 (*) 150 - 400 (K/uL)  DIFFERENTIAL     Status: Abnormal   Collection Time   10/03/11  6:51 PM      Component Value Range   Neutrophils Relative 94 (*) 43 - 77 (%)   Lymphocytes Relative 2 (*) 12 - 46 (%)   Monocytes Relative 4  3 - 12 (%)   Eosinophils Relative 0  0 - 5 (%)   Basophils Relative 0  0 - 1 (%)   Neutro Abs 22.3 (*) 1.7 - 7.7 (K/uL)    Lymphs Abs 0.5 (*) 0.7 - 4.0 (K/uL)   Monocytes Absolute 1.0  0.1 - 1.0 (K/uL)   Eosinophils Absolute 0.0  0.0 - 0.7 (K/uL)   Basophils Absolute 0.0  0.0 - 0.1 (K/uL)   RBC Morphology POLYCHROMASIA PRESENT    COMPREHENSIVE METABOLIC PANEL     Status: Abnormal   Collection Time   10/03/11  6:51 PM      Component Value Range   Sodium 128 (*) 135 - 145 (mEq/L)   Potassium 5.2 (*) 3.5 - 5.1 (mEq/L)   Chloride 85 (*) 96 - 112 (mEq/L)   CO2 25  19 - 32 (mEq/L)   Glucose, Bld 115 (*) 70 - 99 (mg/dL)   BUN 86 (*) 6 - 23 (mg/dL)   Creatinine, Ser 0.86 (*) 0.50 - 1.10 (mg/dL)   Calcium 57.8  8.4 - 10.5 (mg/dL)   Total Protein 6.3  6.0 - 8.3 (g/dL)   Albumin 2.0 (*) 3.5 - 5.2 (g/dL)   AST 19  0 - 37 (U/L)   ALT 18  0 - 35 (U/L)   Alkaline Phosphatase 282 (*) 39 - 117 (U/L)   Total Bilirubin 0.3  0.3 - 1.2 (mg/dL)   GFR calc non Af  Amer 4 (*) >90 (mL/min)   GFR calc Af Amer 5 (*) >90 (mL/min)  LIPASE, BLOOD     Status: Abnormal   Collection Time   10/03/11  6:51 PM      Component Value Range   Lipase 10 (*) 11 - 59 (U/L)  BILIRUBIN, DIRECT     Status: Normal   Collection Time   10/03/11  6:51 PM      Component Value Range   Bilirubin, Direct 0.2  0.0 - 0.3 (mg/dL)  POTASSIUM     Status: Abnormal   Collection Time   10/03/11  6:52 PM      Component Value Range   Potassium 5.3 (*) 3.5 - 5.1 (mEq/L)  URINALYSIS, ROUTINE W REFLEX MICROSCOPIC     Status: Abnormal   Collection Time   10/03/11  9:32 PM      Component Value Range   Color, Urine AMBER (*) YELLOW    APPearance CLOUDY (*) CLEAR    Specific Gravity, Urine 1.024  1.005 - 1.030    pH 5.0  5.0 - 8.0    Glucose, UA NEGATIVE  NEGATIVE (mg/dL)   Hgb urine dipstick NEGATIVE  NEGATIVE    Bilirubin Urine SMALL (*) NEGATIVE    Ketones, ur 15 (*) NEGATIVE (mg/dL)   Protein, ur 629 (*) NEGATIVE (mg/dL)   Urobilinogen, UA 0.2  0.0 - 1.0 (mg/dL)   Nitrite NEGATIVE  NEGATIVE    Leukocytes, UA SMALL (*) NEGATIVE   URINE MICROSCOPIC-ADD ON      Status: Abnormal   Collection Time   10/03/11  9:32 PM      Component Value Range   Squamous Epithelial / LPF RARE  RARE    WBC, UA 0-2  <3 (WBC/hpf)   Bacteria, UA MANY (*) RARE    Casts HYALINE CASTS (*) NEGATIVE   CBC     Status: Abnormal   Collection Time   10/04/11 12:56 AM      Component Value Range   WBC 21.7 (*) 4.0 - 10.5 (K/uL)   RBC 2.99 (*) 3.87 - 5.11 (MIL/uL)   Hemoglobin 8.7 (*) 12.0 - 15.0 (g/dL)   HCT 52.8 (*) 41.3 - 46.0 (%)   MCV 88.0  78.0 - 100.0 (fL)   MCH 29.1  26.0 - 34.0 (pg)   MCHC 33.1  30.0 - 36.0 (g/dL)   RDW 24.4 (*) 01.0 - 15.5 (%)   Platelets 134 (*) 150 - 400 (K/uL)  DIFFERENTIAL     Status: Abnormal   Collection Time   10/04/11 12:56 AM      Component Value Range   Neutrophils Relative 94 (*) 43 - 77 (%)   Lymphocytes Relative 3 (*) 12 - 46 (%)   Monocytes Relative 3  3 - 12 (%)   Eosinophils Relative 0  0 - 5 (%)   Basophils Relative 0  0 - 1 (%)   Neutro Abs 20.3 (*) 1.7 - 7.7 (K/uL)   Lymphs Abs 0.7  0.7 - 4.0 (K/uL)   Monocytes Absolute 0.7  0.1 - 1.0 (K/uL)   Eosinophils Absolute 0.0  0.0 - 0.7 (K/uL)   Basophils Absolute 0.0  0.0 - 0.1 (K/uL)   RBC Morphology TARGET CELLS     WBC Morphology TOXIC GRANULATION    GLUCOSE, CAPILLARY     Status: Abnormal   Collection Time   10/04/11  4:07 AM      Component Value Range   Glucose-Capillary 112 (*) 70 - 99 (mg/dL)  HEMOGLOBIN A1C     Status: Abnormal   Collection Time   10/04/11  5:15 AM      Component Value Range   Hemoglobin A1C 5.7 (*) <5.7 (%)   Mean Plasma Glucose 117 (*) <117 (mg/dL)  BASIC METABOLIC PANEL     Status: Abnormal   Collection Time   10/04/11  5:15 AM      Component Value Range   Sodium 132 (*) 135 - 145 (mEq/L)   Potassium 5.1  3.5 - 5.1 (mEq/L)   Chloride 89 (*) 96 - 112 (mEq/L)   CO2 24  19 - 32 (mEq/L)   Glucose, Bld 101 (*) 70 - 99 (mg/dL)   BUN 89 (*) 6 - 23 (mg/dL)   Creatinine, Ser 6.21 (*) 0.50 - 1.10 (mg/dL)   Calcium 9.5  8.4 - 30.8 (mg/dL)   GFR  calc non Af Amer 4 (*) >90 (mL/min)   GFR calc Af Amer 5 (*) >90 (mL/min)  CBC     Status: Abnormal   Collection Time   10/04/11  5:15 AM      Component Value Range   WBC 21.0 (*) 4.0 - 10.5 (K/uL)   RBC 2.90 (*) 3.87 - 5.11 (MIL/uL)   Hemoglobin 8.2 (*) 12.0 - 15.0 (g/dL)   HCT 65.7 (*) 84.6 - 46.0 (%)   MCV 85.5  78.0 - 100.0 (fL)   MCH 28.3  26.0 - 34.0 (pg)   MCHC 33.1  30.0 - 36.0 (g/dL)   RDW 96.2 (*) 95.2 - 15.5 (%)   Platelets 115 (*) 150 - 400 (K/uL)    Micro Results: No results found for this or any previous visit (from the past 240 hour(s)).  Medications:  Scheduled Meds:   . ampicillin-sulbactam (UNASYN) IV  3 g Intravenous QHS  . carvedilol  6.25 mg Oral BID WC  . doxazosin  2 mg Oral QHS  . fentaNYL  50 mcg Transdermal Q72H  .  HYDROmorphone (DILAUDID) injection  1 mg Intravenous Once  .  HYDROmorphone (DILAUDID) injection  1 mg Intravenous Once  .  HYDROmorphone (DILAUDID) injection  1 mg Intravenous Once  . insulin aspart  0-9 Units Subcutaneous Q4H  . metoCLOPramide (REGLAN) injection  10 mg Intravenous Once  . multivitamin  1 tablet Oral QHS  . ondansetron  4 mg Intravenous Once  . pantoprazole (PROTONIX) IV  40 mg Intravenous Q24H  . ranolazine  500 mg Oral Daily  . sevelamer  3,200 mg Oral TID WC  . simvastatin  10 mg Oral q1800  . sodium chloride  3 mL Intravenous Q12H  . sodium polystyrene  30 g Oral Once  . DISCONTD: Daily Vitamin  1 tablet Oral Daily  . DISCONTD: sodium polystyrene  45 g Oral Once   Continuous Infusions:   . sodium chloride 1,000 mL (10/04/11 0420)   PRN Meds:.acetaminophen, acetaminophen, HYDROmorphone, ondansetron (ZOFRAN) IV, ondansetron, zolpidem, DISCONTD:  HYDROmorphone (DILAUDID) injection  Assessment/Plan:  Present on Admission:  .Epigastric abdominal pain - continue IV abx, ok with clears if pt tolerates .Pancreatic pseudocyst - ask IR for eval for possible pseudocyst drainage .End stage renal disease - will  consult inpt dialysis team .Ischemic cardiomyopathy .Chronic systolic congestive heart failure - monitor I/Os.  Marland KitchenLBBB (left bundle branch block) .Pancreatitis chronic  Hyponatremia - following Hyperkalemia - resolved, recheck in AM Diabetes mellitis - monitor BS closely.  See orders.    LOS: 1 day   Gemini Beaumier 10/04/2011, 12:37 PM   Vickey Ewbank L.  Laural Benes, MD, CDE, FAAFP [facility] [place], [place]  [phone]

## 2011-10-04 NOTE — ED Provider Notes (Signed)
Medical screening examination/treatment/procedure(s) were conducted as a shared visit with non-physician practitioner(s) and myself.  I personally evaluated the patient during the encounter 61 year old woman with severe RUQ abdominal pain.  Recent hospitalization earlier in the month showed a pancreatic pseudocyst.  Exam today shows her to be in severe pain, despite several doses of IV pain medicine.  Her WBC was elevated at 23000 with 94% neutrophils.  Lipase was 10, relatively low.  LFT's abnormal with alk phos high at 282.  CT of abdomen/pelvis showed persistent pancreatic pseudocyst.  Called [facility] and they were capped so called [facility] to admit her for pain control, further evaluation.  Carleene Cooper III, MD 10/04/11 [phone]

## 2011-10-04 NOTE — Consult Note (Signed)
I have seen and examined this patient and agree with plan as outlined above with several exceptions. 1. Need to r/o SBP as source of abd pain and not assume all related to pancreatic cyst as her PD catheter dressing was quite dirty and appears to have been the same one that was placed at her last hospitalization. 2. Normally on CCPD, 5 exchanges, 1.5hr dwell time, 2 liter fill volume, 12 minute fill time, 20 minute drain time, and goes dry during the day.  Unfortunately we do not have another available cycler, so will do CAPD with 2.5 hr dwells and 5 exchanges, alternating 1.5% with 2.5%. But will hold 2.5% Dialysate for SBP<110 3. It appears that her primary nephrologist is Dr. Matthew Folks from [facility] and we will need to contact his office for information.  I am concerned that she is not a suitable candidate for CCPD as she lives alone and is not able to clearly explain her dialysis prescription, nor when she starts the cycler and ends. Jilda Kress A,MD 10/04/2011 3:46 PM

## 2011-10-05 ENCOUNTER — Inpatient Hospital Stay (HOSPITAL_COMMUNITY): Payer: Medicare Other

## 2011-10-05 LAB — CBC
HCT: 22.1 % — ABNORMAL LOW (ref 36.0–46.0)
MCHC: 33.9 g/dL (ref 30.0–36.0)
RDW: 19 % — ABNORMAL HIGH (ref 11.5–15.5)

## 2011-10-05 LAB — GLUCOSE, CAPILLARY
Glucose-Capillary: 192 mg/dL — ABNORMAL HIGH (ref 70–99)
Glucose-Capillary: 146 mg/dL — ABNORMAL HIGH (ref 70–99)
Glucose-Capillary: 162 mg/dL — ABNORMAL HIGH (ref 70–99)

## 2011-10-05 LAB — RENAL FUNCTION PANEL
Albumin: 1.4 g/dL — ABNORMAL LOW (ref 3.5–5.2)
BUN: 86 mg/dL — ABNORMAL HIGH (ref 6–23)
Chloride: 88 mEq/L — ABNORMAL LOW (ref 96–112)
Creatinine, Ser: 7.46 mg/dL — ABNORMAL HIGH (ref 0.50–1.10)
Glucose, Bld: 151 mg/dL — ABNORMAL HIGH (ref 70–99)

## 2011-10-05 LAB — BODY FLUID CELL COUNT WITH DIFFERENTIAL: Neutrophil Count, Fluid: 61 % — ABNORMAL HIGH (ref 0–25)

## 2011-10-05 LAB — CARDIAC PANEL(CRET KIN+CKTOT+MB+TROPI)
CK, MB: 1.5 ng/mL (ref 0.3–4.0)
Total CK: 10 U/L (ref 7–177)

## 2011-10-05 LAB — PATHOLOGIST SMEAR REVIEW

## 2011-10-05 MED ORDER — FENTANYL 25 MCG/HR TD PT72
25.0000 ug | MEDICATED_PATCH | TRANSDERMAL | Status: DC
  Administered 2011-10-07: 25 ug via TRANSDERMAL
  Filled 2011-10-05: qty 1

## 2011-10-05 MED ORDER — HYDROXYZINE HCL 25 MG PO TABS
25.0000 mg | ORAL_TABLET | Freq: Three times a day (TID) | ORAL | Status: DC | PRN
  Administered 2011-10-05: 25 mg via ORAL
  Filled 2011-10-05: qty 1

## 2011-10-05 MED ORDER — SODIUM CHLORIDE 0.9 % IV SOLN
1750.0000 mg | Freq: Once | INTRAVENOUS | Status: AC
  Administered 2011-10-05: 1750 mg via INTRAVENOUS
  Filled 2011-10-05: qty 1750

## 2011-10-05 MED ORDER — HYDROXYZINE HCL 10 MG PO TABS
10.0000 mg | ORAL_TABLET | Freq: Two times a day (BID) | ORAL | Status: DC | PRN
  Filled 2011-10-05: qty 1

## 2011-10-05 NOTE — Progress Notes (Signed)
[facility] Progress Note  10/05/2011   Subjective: Pt reports that she is feeling a lot better.  Her abdominal pain has diminished.  Denies n/v/d.  No CP or SOB.   Objective:  Vital signs in last 24 hours: Filed Vitals:   10/05/11 0748 10/05/11 0936 10/05/11 1145 10/05/11 1400  BP:  104/69 91/66 94/60   Pulse:  76 88 76  Temp:  98 F (36.7 C)  98.2 F (36.8 C)  TempSrc:  Oral  Oral  Resp:  20  20  Height:      Weight: 65 kg (143 lb 4.8 oz)     SpO2:  99%  98%   Weight change: 3.811 kg (8 lb 6.4 oz)  Intake/Output Summary (Last 24 hours) at 10/05/11 1542 Last data filed at 10/05/11 1300  Gross per 24 hour  Intake    960 ml  Output      0 ml  Net    960 ml   Lab Results  Component Value Date   HGBA1C 5.7* 10/04/2011   HGBA1C 5.2 06/03/2011   HGBA1C  Value: 4.8 (NOTE)                                                                       According to the ADA Clinical Practice Recommendations for 2011, when HbA1c is used as a screening test:   >=6.5%   Diagnostic of Diabetes Mellitus           (if abnormal result  is confirmed)  5.7-6.4%   Increased risk of developing Diabetes Mellitus  References:Diagnosis and Classification of Diabetes Mellitus,Diabetes Care,2011,34(Suppl 1):S62-S69 and Standards of Medical Care in         Diabetes - 2011,Diabetes Care,2011,34  (Suppl 1):S11-S61. 11/10/2010   Lab Results  Component Value Date   LDLCALC  Value: 56        Total Cholesterol/HDL:CHD Risk Coronary Heart Disease Risk Table                     Men   Women  1/2 Average Risk   3.4   3.3  Average Risk       5.0   4.4  2 X Average Risk   9.6   7.1  3 X Average Risk  23.4   11.0        Use the calculated Patient Ratio above and the CHD Risk Table to determine the patient's CHD Risk.        ATP III CLASSIFICATION (LDL):  <100     mg/dL   Optimal  161-096  mg/dL   Near or Above                    Optimal  130-159  mg/dL   Borderline  045-409  mg/dL   High  >811     mg/dL   Very High  02/27/7828   CREATININE 7.46* 10/05/2011    Review of Systems As above, otherwise all reviewed and reported negative  Physical Exam General - pt lethargic and confused, no distress, appears comfortable  HEENT - NCAT, MM dry  Lungs - BBS, shallow CV - normal s1, s2 sounds  Abd - soft, Mild distended, no masses, normal  bs Ext - 1+ edema bilateral LEs.   Lab Results: Results for orders placed during the hospital encounter of 10/03/11 (from the past 24 hour(s))  GLUCOSE, CAPILLARY     Status: Abnormal   Collection Time   10/04/11  5:08 PM      Component Value Range   Glucose-Capillary 124 (*) 70 - 99 (mg/dL)  GLUCOSE, CAPILLARY     Status: Abnormal   Collection Time   10/04/11 10:09 PM      Component Value Range   Glucose-Capillary 181 (*) 70 - 99 (mg/dL)  BODY FLUID CELL COUNT WITH DIFFERENTIAL     Status: Abnormal   Collection Time   10/05/11  1:22 AM      Component Value Range   Fluid Type-FCT PERITONEAL DIALYSATE     Color, Fluid COLORLESS (*) YELLOW    Appearance, Fluid CLEAR  CLEAR    WBC, Fluid 64  0 - 1000 (cu mm)   Neutrophil Count, Fluid 61 (*) 0 - 25 (%)   Lymphs, Fluid 16     Monocyte-Macrophage-Serous Fluid 23 (*) 50 - 90 (%)   Eos, Fluid 0    PATHOLOGIST SMEAR REVIEW     Status: Normal   Collection Time   10/05/11  1:22 AM      Component Value Range   Tech Review       Value: RARE ATYPICAL CELL MOSTLY NEUTROPHILS AND MACROPHAGES.  CBC     Status: Abnormal   Collection Time   10/05/11  7:00 AM      Component Value Range   WBC 17.2 (*) 4.0 - 10.5 (K/uL)   RBC 2.61 (*) 3.87 - 5.11 (MIL/uL)   Hemoglobin 7.5 (*) 12.0 - 15.0 (g/dL)   HCT 78.4 (*) 69.6 - 46.0 (%)   MCV 84.7  78.0 - 100.0 (fL)   MCH 28.7  26.0 - 34.0 (pg)   MCHC 33.9  30.0 - 36.0 (g/dL)   RDW 29.5 (*) 28.4 - 15.5 (%)   Platelets 83 (*) 150 - 400 (K/uL)  LIPASE, BLOOD     Status: Abnormal   Collection Time   10/05/11  7:00 AM      Component Value Range   Lipase 10 (*) 11 - 59 (U/L)  RENAL  FUNCTION PANEL     Status: Abnormal   Collection Time   10/05/11  7:00 AM      Component Value Range   Sodium 129 (*) 135 - 145 (mEq/L)   Potassium 4.0  3.5 - 5.1 (mEq/L)   Chloride 88 (*) 96 - 112 (mEq/L)   CO2 23  19 - 32 (mEq/L)   Glucose, Bld 151 (*) 70 - 99 (mg/dL)   BUN 86 (*) 6 - 23 (mg/dL)   Creatinine, Ser 1.32 (*) 0.50 - 1.10 (mg/dL)   Calcium 9.0  8.4 - 44.0 (mg/dL)   Phosphorus 6.1 (*) 2.3 - 4.6 (mg/dL)   Albumin 1.4 (*) 3.5 - 5.2 (g/dL)   GFR calc non Af Amer 5 (*) >90 (mL/min)   GFR calc Af Amer 6 (*) >90 (mL/min)  GLUCOSE, CAPILLARY     Status: Abnormal   Collection Time   10/05/11  8:09 AM      Component Value Range   Glucose-Capillary 192 (*) 70 - 99 (mg/dL)  GLUCOSE, CAPILLARY     Status: Abnormal   Collection Time   10/05/11 11:42 AM      Component Value Range   Glucose-Capillary 150 (*) 70 - 99 (mg/dL)  Micro Results: Recent Results (from the past 240 hour(s))  CULTURE, BLOOD (ROUTINE X 2)     Status: Normal (Preliminary result)   Collection Time   10/04/11  2:15 AM      Component Value Range Status Comment   Specimen Description BLOOD RIGHT FINGER   Final    Special Requests BOTTLES DRAWN AEROBIC ONLY [facility]   Final    Culture  Setup Time [id number]   Final    Culture     Final    Value: GRAM POSITIVE COCCI IN CLUSTERS     Note: Gram Stain Report Called to,Read Back By and Verified With: Elsie Lincoln RN on 10/05/11 at 00:35 by Christie Nottingham   Report Status PENDING   Incomplete   CULTURE, BLOOD (ROUTINE X 2)     Status: Normal (Preliminary result)   Collection Time   10/04/11  2:30 AM      Component Value Range Status Comment   Specimen Description BLOOD RIGHT HAND   Final    Special Requests BOTTLES DRAWN AEROBIC ONLY 5CC   Final    Culture  Setup Time [id number]   Final    Culture     Final    Value: GRAM POSITIVE COCCI IN CLUSTERS     Note: Gram Stain Report Called to,Read Back By and Verified With: Elsie Lincoln RN on 10/05/11 at 00:35 by Christie Nottingham     Report Status PENDING   Incomplete   BODY FLUID CULTURE     Status: Normal (Preliminary result)   Collection Time   10/04/11  6:45 AM      Component Value Range Status Comment   Specimen Description PERITONEAL FLUID   Final    Special Requests 1 CUP   Final    Gram Stain PENDING   Incomplete    Culture NO GROWTH 1 DAY   Final    Report Status PENDING   Incomplete     Medications:  Scheduled Meds:   . ampicillin-sulbactam (UNASYN) IV  3 g Intravenous QHS  . carvedilol  6.25 mg Oral BID WC  . darbepoetin (ARANESP) injection - NON-DIALYSIS  40 mcg Subcutaneous Q Sun-1800  . doxazosin  2 mg Oral QHS  . feeding supplement  1 Container Oral TID BM  . fentaNYL  50 mcg Transdermal Q72H  . insulin aspart  0-9 Units Subcutaneous Q4H  . multivitamin  1 tablet Oral QHS  . pantoprazole (PROTONIX) IV  40 mg Intravenous Q24H  . ranolazine  500 mg Oral Daily  . sevelamer  3,200 mg Oral TID WC  . simvastatin  10 mg Oral q1800  . sodium chloride  3 mL Intravenous Q12H  . vancomycin  1,750 mg Intravenous Once   Continuous Infusions:   . sodium chloride 1,000 mL (10/04/11 0420)   PRN Meds:.acetaminophen, acetaminophen, HYDROmorphone, hydrOXYzine, ondansetron (ZOFRAN) IV, ondansetron, zolpidem  Assessment/Plan: .Epigastric abdominal pain, concerning for SBP (suspicious fluid recovered from tap) - continue IV abx, cultures and cytology pending, ok with clears if pt tolerates .Pancreatic pseudocyst - pt will most likely need to go to a tertiary care facility for drainage.  According to family, Dr. [name] to arrange outpt.   .End stage renal disease - continue inpt PD and followed by nephrology .Ischemic cardiomyopathy .Chronic systolic congestive heart failure - monitor I/Os.  Marland KitchenLBBB (left bundle branch block) .Pancreatitis chronic with pseudocysts Anemia of chronic disease - following closely.   Hyponatremia - following, hold IVFs, saline lock IV.  Hyperkalemia - resolved,  recheck in AM   Diabetes mellitis -bs controlled,  monitor BS closely. See orders.   I had a long discussion with family and updated them on her progress and treatment plan, they verbalized understanding.      LOS: 2 days   Davelle Anselmi 10/05/2011, 3:42 PM   Cleora Fleet, MD, CDE, FAAFP [facility] [address] [place], [place]  [phone]

## 2011-10-05 NOTE — Significant Event (Signed)
Rapid Response Event Note  Overview: Time Called: 1740 Arrival Time: 1745 Event Type: Neurologic  Initial Focused Assessment: Called to patient bedside because change in mental status. Patient awake but not interactive initially, not following commands. RN completed PD cycle.  MD at bedside to assess patient. Patient became more interactive, answering some questions, following basic commands. Cont. Flat affect, slow to respond  Lung sounds decreased on Left, regular heart tones. BP 98/62 HR 108  RR 24 mild inc WOB, O2 sat 94.  Will cont. To monitor.  RN to call if needed.  Interventions:   Event Summary: Name of Physician Notified: Dr Laural Benes at 1740    at    Outcome: Stayed in room and stabalized  Event End Time: 1800  Marcellina Millin

## 2011-10-05 NOTE — Progress Notes (Signed)
[facility] ROUNDING NOTE   Subjective:   Interval History: none.  Objective:  Vital signs in last 24 hours:  Temp:  [97.5 F (36.4 C)-98.1 F (36.7 C)] 98.1 F (36.7 C) (04/22 0500) Pulse Rate:  [96-110] 96  (04/22 0500) Resp:  [17-18] 18  (04/22 0500) BP: (93-108)/(50-77) 93/50 mmHg (04/22 0500) SpO2:  [91 %-100 %] 91 % (04/22 0500) Weight:  [65 kg (143 lb 4.8 oz)-65.5 kg (144 lb 6.4 oz)] 65 kg (143 lb 4.8 oz) (04/22 0748)  Weight change: 3.811 kg (8 lb 6.4 oz) Filed Weights   10/04/11 0200 10/04/11 2100 10/05/11 0748  Weight: 61.1 kg (134 lb 11.2 oz) 65.5 kg (144 lb 6.4 oz) 65 kg (143 lb 4.8 oz)    Intake/Output: I/O last 3 completed shifts: In: 720 [P.O.:720] Out: -    Intake/Output this shift:     General: Ill appearing, unkempt appears more alert Head: puffy face Neck: Supple.  Lungs: clear Heart: RRR with S1 S2. No murmurs, rubs, or gallops appreciated.  Abdomen: Soft, not significantly-tender, non-distended with normoactive bowel sounds. No rebound/guarding. No obvious abdominal masses.  Lower extremities: 1 + LE Neuro: awake and alert     Basic Metabolic Panel:  Lab 10/05/11 1610 10/04/11 0515 10/03/11 1852 10/03/11 1851 10/03/11 1840  NA 129* 132* -- 128* 125*  K 4.0 5.1 5.3* 5.2* 6.5*  CL 88* 89* -- 85* 97  CO2 23 24 -- 25 --  GLUCOSE 151* 101* -- 115* 114*  BUN 86* 89* -- 86* 108*  CREATININE 7.46* 8.76* -- 8.74* 9.30*  CALCIUM 9.0 9.5 -- 10.1 --  MG -- -- -- -- --  PHOS 6.1* -- -- -- --    Liver Function Tests:  Lab 10/05/11 0700 10/03/11 1851  AST -- 19  ALT -- 18  ALKPHOS -- 282*  BILITOT -- 0.3  PROT -- 6.3  ALBUMIN 1.4* 2.0*    Lab 10/05/11 0700 10/03/11 1851  LIPASE 10* 10*  AMYLASE -- --   No results found for this basename: AMMONIA:3 in the last 168 hours  CBC:  Lab 10/05/11 0700 10/04/11 0515 10/04/11 0056 10/03/11 1851 10/03/11 1840  WBC 17.2* 21.0* 21.7* 23.8* --  NEUTROABS -- -- 20.3* 22.3* --    HGB 7.5* 8.2* 8.7* 8.8* 11.9*  HCT 22.1* 24.8* 26.3* 26.5* 35.0*  MCV 84.7 85.5 88.0 86.9 --  PLT 83* 115* 134* 121* --    Cardiac Enzymes: No results found for this basename: CKTOTAL:5,CKMB:5,CKMBINDEX:5,TROPONINI:5 in the last 168 hours  BNP: No components found with this basename: POCBNP:5  CBG:  Lab 10/05/11 0809 10/04/11 2209 10/04/11 1708 10/04/11 1329 10/04/11 0407  GLUCAP 192* 181* 124* 79 112*    Microbiology: Results for orders placed during the hospital encounter of 10/03/11  CULTURE, BLOOD (ROUTINE X 2)     Status: Normal (Preliminary result)   Collection Time   10/04/11  2:15 AM      Component Value Range Status Comment   Specimen Description BLOOD RIGHT FINGER   Final    Special Requests BOTTLES DRAWN AEROBIC ONLY [facility]   Final    Culture  Setup Time [id number]   Final    Culture     Final    Value: GRAM POSITIVE COCCI IN CLUSTERS     Note: Gram Stain Report Called to,Read Back By and Verified With: Elsie Lincoln RN on 10/05/11 at 00:35 by Christie Nottingham   Report Status PENDING   Incomplete   CULTURE,  BLOOD (ROUTINE X 2)     Status: Normal (Preliminary result)   Collection Time   10/04/11  2:30 AM      Component Value Range Status Comment   Specimen Description BLOOD RIGHT HAND   Final    Special Requests BOTTLES DRAWN AEROBIC ONLY 5CC   Final    Culture  Setup Time [id number]   Final    Culture     Final    Value: GRAM POSITIVE COCCI IN CLUSTERS     Note: Gram Stain Report Called to,Read Back By and Verified With: Elsie Lincoln RN on 10/05/11 at 00:35 by Christie Nottingham   Report Status PENDING   Incomplete     Coagulation Studies: No results found for this basename: LABPROT:5,INR:5 in the last 72 hours  Urinalysis:  Basename 10/03/11 2132  COLORURINE AMBER*  LABSPEC 1.024  PHURINE 5.0  GLUCOSEU NEGATIVE  HGBUR NEGATIVE  BILIRUBINUR SMALL*  KETONESUR 15*  PROTEINUR 100*  UROBILINOGEN 0.2  NITRITE NEGATIVE  LEUKOCYTESUR SMALL*      Imaging: Ct  Abdomen Pelvis Wo Contrast  10/03/2011  *RADIOLOGY REPORT*  Clinical Data: Abdominal/back pain  CT ABDOMEN AND PELVIS WITHOUT CONTRAST  Technique:  Multidetector CT imaging of the abdomen and pelvis was performed following the standard protocol without intravenous contrast.  Comparison: 09/13/2011  Findings: Patchy opacity / atelectasis at the lung bases.  Cardiomegaly.  Unenhanced liver, spleen, and adrenal glands are within normal limits.  Pancreas is poorly evaluated in the absence of intravenous contrast administration. Suspected 1.5 x 1.9 cm fluid collection containing a tiny focus of gas anterior to the pancreatic body (series 2/image 34), possibly reflecting a pseudocyst, improved. Additional 5.4 x 3.9 cm suspected complex fluid collection adjacent to the pancreatic tail (series 2/image 32), with adjacent scattered foci of gas medially (series 2/images 31 and 33), likely reflecting a worsening pseudocyst.  Unenhanced kidneys are notable for small probable cysts. Small punctate nonobstructing left lower pole calculus (series 2/image 47).  No hydronephrosis.  No evidence of bowel obstruction.  Normal appendix.  Colonic diverticulosis, without associated inflammatory changes.  Atherosclerotic calcifications of the abdominal aorta and branch vessels.  Peritoneal dialysis catheter in the left abdomen. Small to moderate abdominopelvic ascites.  No suspicious abdominopelvic lymphadenopathy.  Status post hysterectomy.  No adnexal masses.  Bladder is underdistended.  Visualized osseous structures are within normal limits.  IMPRESSION: Pancreas is poorly evaluated in the absence of intravenous contrast administration.  Suspected 1.5 x 1.9 cm fluid collection anterior to the pancreatic body, improved.  Suspected 5.4 x 3.9 cm complex fluid collection adjacent to the pancreatic tail, increased.  Both likely reflect pancreatic pseudocysts and are notable for tiny foci of gas, correlate for intervention.  No evidence of  bowel obstruction.  Normal appendix.  Colonic diverticulosis, without associated inflammatory changes.  Peritoneal dialysis catheter in the left abdomen.  Small to moderate abdominopelvic ascites.  Original Report Authenticated By: Charline Bills, M.D.     Medications:      . sodium chloride 1,000 mL (10/04/11 0420)      . ampicillin-sulbactam (UNASYN) IV  3 g Intravenous QHS  . carvedilol  6.25 mg Oral BID WC  . darbepoetin (ARANESP) injection - NON-DIALYSIS  40 mcg Subcutaneous Q Sun-1800  . doxazosin  2 mg Oral QHS  . feeding supplement  1 Container Oral TID BM  . fentaNYL  50 mcg Transdermal Q72H  . insulin aspart  0-9 Units Subcutaneous Q4H  . multivitamin  1 tablet Oral  QHS  . pantoprazole (PROTONIX) IV  40 mg Intravenous Q24H  . ranolazine  500 mg Oral Daily  . sevelamer  3,200 mg Oral TID WC  . simvastatin  10 mg Oral q1800  . sodium chloride  3 mL Intravenous Q12H  . vancomycin  1,750 mg Intravenous Once   acetaminophen, acetaminophen, HYDROmorphone, ondansetron (ZOFRAN) IV, ondansetron, zolpidem  Assessment/ Plan:   ESRD- CAPD 1.5% bags blood pressure a little low  HTN controlled  Anemia stable  Abdominal pain  PD may be aggravating although patient is responding to antimicrobials     LOS: 2 [name] @TODAY @8 :50 AM

## 2011-10-05 NOTE — Progress Notes (Signed)
   CARE MANAGEMENT NOTE 10/05/2011  Patient:  [name], [name]   Account Number:  [id number]  Date Initiated:  10/05/2011  Documentation initiated by:  Onnie Boer  Subjective/Objective Assessment:   PT WAS ADMITTED WITH PANCREATIC CYST AND INFECTION     Action/Plan:   PROGRESSION OF CARE AND DISCHARGE PLANNING   Anticipated DC Date:  10/09/2011   Anticipated DC Plan:  HOME W HOME HEALTH SERVICES      DC Planning Services  CM consult      Choice offered to / List presented to:             Status of service:  In process, will continue to follow Medicare Important Message given?   (If response is "NO", the following Medicare IM given date fields will be blank) Date Medicare IM given:   Date Additional Medicare IM given:    Discharge Disposition:    Per UR Regulation:  Reviewed for med. necessity/level of care/duration of stay  If discussed at Long Length of Stay Meetings, dates discussed:    Comments:  10/05/11 Onnie Boer, RN, BSN [id number] PTA PT WAS AT HOME WITH HER DAUGHTER.  PT DOES PERITONEAL DIALYSIS.  WILL F/U ON DC NEEDS

## 2011-10-05 NOTE — Progress Notes (Signed)
INITIAL ADULT NUTRITION ASSESSMENT Date: 10/05/2011   Time: 2:52 PM Reason for Assessment: Screened at nutrition risk for unintentional weight loss  ASSESSMENT: [name]  Dx: Epigastric abdominal pain  Hx:  Past Medical History  Diagnosis Date  . Gallstone pancreatitis   . Coronary artery disease     s/p CABG 2008 with multiple PCIs  . Ischemic cardiomyopathy   . End stage renal disease   . Diabetes mellitus   . Hypertension   . History of nonadherence to medical treatment   . LBBB (left bundle branch block)   . Hyperlipidemia   . Renal insufficiency   . CHF (congestive heart failure)   . Dialysis patient   . Anemia     Related Meds:  Scheduled Meds:   . ampicillin-sulbactam (UNASYN) IV  3 g Intravenous QHS  . carvedilol  6.25 mg Oral BID WC  . darbepoetin (ARANESP) injection - NON-DIALYSIS  40 mcg Subcutaneous Q Sun-1800  . doxazosin  2 mg Oral QHS  . feeding supplement  1 Container Oral TID BM  . fentaNYL  50 mcg Transdermal Q72H  . insulin aspart  0-9 Units Subcutaneous Q4H  . multivitamin  1 tablet Oral QHS  . pantoprazole (PROTONIX) IV  40 mg Intravenous Q24H  . ranolazine  500 mg Oral Daily  . sevelamer  3,200 mg Oral TID WC  . simvastatin  10 mg Oral q1800  . sodium chloride  3 mL Intravenous Q12H  . vancomycin  1,750 mg Intravenous Once   Continuous Infusions:   . sodium chloride 1,000 mL (10/04/11 0420)   PRN Meds:.acetaminophen, acetaminophen, HYDROmorphone, hydrOXYzine, ondansetron (ZOFRAN) IV, ondansetron, zolpidem   Ht: 5\' 4"  (162.6 cm)  Wt: 143 lb 4.8 oz (65 kg)  Ideal Wt: 54.54 kg % Ideal Wt: 119%  Usual Wt: 176 lb (79.8 kg) % Usual weight: 81.25%  Wt Readings from Last 3 Encounters:  10/05/11 143 lb 4.8 oz (65 kg)  09/17/11 132 lb 7.9 oz (60.1 kg)  09/17/11 132 lb 7.9 oz (60.1 kg)   *Weight is trending up, but still 81.25% of UBW.   Body mass index is 24.60 kg/(m^2). (WNL)  Food/Nutrition Related Hx: Patient with a poor  appetite over the past few months. Today patient states appetite has been good. Patient reports good PO intake at home. Patient denies any nausea or vomiting. She reported she is drinking the [employer]. Varied PO intake documented at meals between 0-65%.   Labs:  CMP     Component Value Date/Time   NA 129* 10/05/2011 0700   K 4.0 10/05/2011 0700   CL 88* 10/05/2011 0700   CO2 23 10/05/2011 0700   GLUCOSE 151* 10/05/2011 0700   BUN 86* 10/05/2011 0700   CREATININE 7.46* 10/05/2011 0700   CALCIUM 9.0 10/05/2011 0700   PROT 6.3 10/03/2011 1851   ALBUMIN 1.4* 10/05/2011 0700   AST 19 10/03/2011 1851   ALT 18 10/03/2011 1851   ALKPHOS 282* 10/03/2011 1851   BILITOT 0.3 10/03/2011 1851   GFRNONAA 5* 10/05/2011 0700   GFRAA 6* 10/05/2011 0700    Intake/Output Summary (Last 24 hours) at 10/05/11 1503 Last data filed at 10/05/11 1300  Gross per 24 hour  Intake    960 ml  Output      0 ml  Net    960 ml     Diet Order: Clear Liquid  Supplements/Tube Feeding: Resource Breeze TID. Provides 750 kcal and 27 grams of protein daily.  IVF:    sodium chloride Last Rate: 1,000 mL (10/04/11 0420)    Estimated Nutritional Needs:   Kcal: 1770 - 2050   Protein: 75 - 85 grams protein Fluid: 1.2 L/day  NUTRITION DIAGNOSIS: -Inadequate oral intake (NI-2.1).  Status: Ongoing  RELATED TO: Epigastric abdominal pain  AS EVIDENCE BY: poor PO intake documented 0-65% at meals.   MONITORING/EVALUATION(Goals): PO intake, weight trends, labs, Diet advancement 1. PO intake > 75% at meals 2. Minimize weight loss 3. Diet advancement as medically able. Intake to meet > 90% if estimated energy needs.   EDUCATION NEEDS: -No education needs identified at this time  INTERVENTION: 1. Will continue to send Resource Breeze supplement TID. 2. RD to follow for nutrition plan of care.   Dietitian [phone]  DOCUMENTATION CODES Per approved criteria  -Not Applicable    [facility] 10/05/2011, 2:52 PM

## 2011-10-05 NOTE — Progress Notes (Signed)
Came to reassess pt after reported by nurse that she was not following commands and BP was elevated.  She had just finished her PD exchange.  I went to see her and her VS had already started to return to baseline.  Her pulse ox was 92-94% on RA.  Her pulse was 103.  She had clear lung.  She would only selectively answer questions.  Will continue to monitor on tele for now and if necessary transfer to step down unit.  Will check some labs.  See orders.    Maryln Manuel, MD

## 2011-10-05 NOTE — Progress Notes (Signed)
Pt's altered mental status has changed from this morning. Pt is arousable but would not follow commands. Her bp was 151/90, HR 123. Pt would not answer any questions. She had a flat affect. MD was notified and RRT was called to assess the pt. Will f/u.

## 2011-10-05 NOTE — Progress Notes (Signed)
PHARMACY - ANTIBIOTIC CONSULT  Initial Consult Note  Pharmacy Consult for: Vancomycin  Indication: Empiric for bacteremia   Patient Data:   Allergies: Allergies  Allergen Reactions  . Morphine And Related Anaphylaxis  . Codeine Itching  . Lisinopril     Language changed    Patient Measurements: Height: 5\' 4"  (162.6 cm) Weight: 144 lb 6.4 oz (65.5 kg) IBW/kg (Calculated) : 54.Dorothy    Vital Signs: Temp:  [97.5 F (36.4 C)-98.2 F (36.8 C)] 97.6 F (36.4 C) (04/21 2100) Pulse Rate:  [96-111] 96  (04/21 2100) Resp:  [16-18] 18  (04/21 2100) BP: (93-108)/(65-77) 107/77 mmHg (04/21 2100) SpO2:  [94 %-100 %] 94 % (04/21 2100) Weight:  [134 lb 11.2 oz (61.1 kg)-144 lb 6.4 oz (65.5 kg)] 144 lb 6.4 oz (65.5 kg) (04/21 2100)  BMI: Estimated Body mass index is 24.79 kg/(m^2) as calculated from the following:   Height as of this encounter: 5\' 4" (1.626 m).   Weight as of this encounter: 144 lb 6.4 oz(65.5 kg).  Intake/Output from previous day:  Intake/Output Summary (Last 24 hours) at 10/05/11 0154 Last data filed at 10/04/11 1700  Gross per 24 hour  Intake    720 ml  Output      0 ml  Net    720 ml    Labs:  Basename 10/04/11 0515 10/04/11 0056 10/03/11 1851 10/03/11 1840  WBC 21.0* 21.Dorothy* 23.8* --  HGB 8.2* 8.Dorothy* 8.8* --  PLT 115* 134* 121* --  LABCREA -- -- -- --  CREATININE 8.76* -- 8.74* 9.30*   Estimated Creatinine Clearance: 5.Dorothy ml/min (by C-G formula based on Cr of 8.76). No results found for this basename: VANCOTROUGH:2,VANCOPEAK:2,VANCORANDOM:2,GENTTROUGH:2,GENTPEAK:2,GENTRANDOM:2,TOBRATROUGH:2,TOBRAPEAK:2,TOBRARND:2,AMIKACINPEAK:2,AMIKACINTROU:2,AMIKACIN:2, in the last 72 hours   Microbiology: Recent Results (from the past 720 hour(s))  MRSA PCR SCREENING     Status: Normal   Collection Time   09/14/11  8:49 AM      Component Value Range Status Comment   MRSA by PCR NEGATIVE  NEGATIVE  Final   CULTURE, BLOOD (ROUTINE X 2)     Status: Normal (Preliminary  result)   Collection Time   10/04/11  2:15 AM      Component Value Range Status Comment   Specimen Description BLOOD RIGHT FINGER   Final    Special Requests BOTTLES DRAWN AEROBIC ONLY [facility]   Final    Culture  Setup Time [id number]   Final    Culture     Final    Value: GRAM POSITIVE COCCI IN CLUSTERS     Note: Gram Stain Report Called to,Read Back By and Verified With: Elsie Lincoln RN on 10/05/11 at 00:35 by Christie Nottingham   Report Status PENDING   Incomplete   CULTURE, BLOOD (ROUTINE X 2)     Status: Normal (Preliminary result)   Collection Time   10/04/11  2:30 AM      Component Value Range Status Comment   Specimen Description BLOOD RIGHT HAND   Final    Special Requests BOTTLES DRAWN AEROBIC ONLY 5CC   Final    Culture  Setup Time [id number]   Final    Culture     Final    Value: GRAM POSITIVE COCCI IN CLUSTERS     Note: Gram Stain Report Called to,Read Back By and Verified With: Elsie Lincoln RN on 10/05/11 at 00:35 by Christie Nottingham   Report Status PENDING   Incomplete     Medical History: Past Medical History  Diagnosis Date  .  Gallstone pancreatitis   . Coronary artery disease     s/p CABG 2008 with multiple PCIs  . Ischemic cardiomyopathy   . End stage renal disease   . Diabetes mellitus   . Hypertension   . History of nonadherence to medical treatment   . LBBB (left bundle branch block)   . Hyperlipidemia   . Renal insufficiency   . CHF (congestive heart failure)   . Dialysis patient   . Anemia     Scheduled Medications:     . ampicillin-sulbactam (UNASYN) IV  3 g Intravenous QHS  . carvedilol  6.25 mg Oral BID WC  . darbepoetin (ARANESP) injection - NON-DIALYSIS  40 mcg Subcutaneous Q Sun-1800  . doxazosin  2 mg Oral QHS  . feeding supplement  1 Container Oral TID BM  . fentaNYL  50 mcg Transdermal Q72H  . insulin aspart  0-9 Units Subcutaneous Q4H  . multivitamin  1 tablet Oral QHS  . pantoprazole (PROTONIX) IV  40 mg Intravenous Q24H  . ranolazine  500 mg  Oral Daily  . sevelamer  3,200 mg Oral TID WC  . simvastatin  10 mg Oral q1800  . sodium chloride  3 mL Intravenous Q12H  . DISCONTD: Daily Vitamin  1 tablet Oral Daily    Assessment:  [name] admitted on 10/03/2011, with abdominal pain. Patient with 2/2 blood cultures (10/04/11) with gram positive cocci in clusters. Pharmacy consulted to manage vancomycin. Patient is also currently on Unasyn. Patient with likely pancreatic pseudocysts per CT.  Goal of Therapy:  1. Vancomycin trough level 15-20 mcg/ml  Plan:  1. Vancomycin 1750mg  IV x 1.  2. Vancomycin random level in 3-4 days.   [name], PharmD 10/05/2011, 1:54 AM

## 2011-10-06 ENCOUNTER — Inpatient Hospital Stay (HOSPITAL_COMMUNITY): Payer: Medicare Other

## 2011-10-06 LAB — COMPREHENSIVE METABOLIC PANEL
AST: 43 U/L — ABNORMAL HIGH (ref 0–37)
BUN: 75 mg/dL — ABNORMAL HIGH (ref 6–23)
CO2: 25 mEq/L (ref 19–32)
Chloride: 90 mEq/L — ABNORMAL LOW (ref 96–112)
Creatinine, Ser: 6.24 mg/dL — ABNORMAL HIGH (ref 0.50–1.10)
GFR calc non Af Amer: 6 mL/min — ABNORMAL LOW (ref >90)
Glucose, Bld: 116 mg/dL — ABNORMAL HIGH (ref 70–99)
Total Bilirubin: 0.4 mg/dL (ref 0.3–1.2)

## 2011-10-06 LAB — DIFFERENTIAL
Basophils Relative: 0 % (ref 0–1)
Eosinophils Relative: 0 % (ref 0–5)
Lymphs Abs: 0.5 10*3/uL — ABNORMAL LOW (ref 0.7–4.0)
Monocytes Absolute: 0.7 10*3/uL (ref 0.1–1.0)
Neutro Abs: 11.9 10*3/uL — ABNORMAL HIGH (ref 1.7–7.7)

## 2011-10-06 LAB — CBC
Hemoglobin: 7.6 g/dL — ABNORMAL LOW (ref 12.0–15.0)
MCH: 28.3 pg (ref 26.0–34.0)
MCV: 86.6 fL (ref 78.0–100.0)
RBC: 2.69 MIL/uL — ABNORMAL LOW (ref 3.87–5.11)

## 2011-10-06 LAB — GLUCOSE, CAPILLARY
Glucose-Capillary: 129 mg/dL — ABNORMAL HIGH (ref 70–99)
Glucose-Capillary: 220 mg/dL — ABNORMAL HIGH (ref 70–99)

## 2011-10-06 MED ORDER — INSULIN ASPART 100 UNIT/ML ~~LOC~~ SOLN
0.0000 [IU] | Freq: Three times a day (TID) | SUBCUTANEOUS | Status: DC
  Administered 2011-10-06: 1 [IU] via SUBCUTANEOUS
  Administered 2011-10-07 (×2): 2 [IU] via SUBCUTANEOUS
  Administered 2011-10-07 – 2011-10-08 (×2): 3 [IU] via SUBCUTANEOUS
  Administered 2011-10-08 – 2011-10-09 (×2): 2 [IU] via SUBCUTANEOUS
  Administered 2011-10-09: 1 [IU] via SUBCUTANEOUS
  Administered 2011-10-09 – 2011-10-10 (×3): 2 [IU] via SUBCUTANEOUS

## 2011-10-06 MED ORDER — POTASSIUM CHLORIDE CRYS ER 20 MEQ PO TBCR: 40.0000 meq | EXTENDED_RELEASE_TABLET | Freq: Once | ORAL | Status: DC

## 2011-10-06 MED ORDER — INSULIN GLARGINE 100 UNIT/ML ~~LOC~~ SOLN
5.0000 [IU] | Freq: Every day | SUBCUTANEOUS | Status: DC
  Administered 2011-10-06 – 2011-10-10 (×5): 5 [IU] via SUBCUTANEOUS

## 2011-10-06 MED ORDER — POTASSIUM CHLORIDE CRYS ER 20 MEQ PO TBCR
40.0000 meq | EXTENDED_RELEASE_TABLET | Freq: Two times a day (BID) | ORAL | Status: AC
  Administered 2011-10-06 – 2011-10-07 (×4): 40 meq via ORAL
  Filled 2011-10-06 (×4): qty 2

## 2011-10-06 MED ORDER — FUROSEMIDE 10 MG/ML IJ SOLN
40.0000 mg | Freq: Once | INTRAMUSCULAR | Status: AC
  Administered 2011-10-06: 40 mg via INTRAVENOUS
  Filled 2011-10-06: qty 4

## 2011-10-06 NOTE — Progress Notes (Signed)
[facility] ROUNDING NOTE   Subjective:   Interval History: none.  Objective:  Vital signs in last 24 hours:  Temp:  [98 F (36.7 C)-98.7 F (37.1 C)] 98.7 F (37.1 C) (04/23 0615) Pulse Rate:  [68-123] 90  (04/23 0615) Resp:  [16-24] 16  (04/23 0615) BP: (91-151)/(59-90) 95/65 mmHg (04/23 0615) SpO2:  [93 %-99 %] 97 % (04/23 0615) Weight:  [65 kg (143 lb 4.8 oz)] 65 kg (143 lb 4.8 oz) (04/22 1936)  Weight change: -0.5 kg (-1 lb 1.6 oz) Filed Weights   10/04/11 2100 10/05/11 0748 10/05/11 1936  Weight: 65.5 kg (144 lb 6.4 oz) 65 kg (143 lb 4.8 oz) 65 kg (143 lb 4.8 oz)    Intake/Output: I/O last 3 completed shifts: In: 1495.8 [P.O.:600; I.V.:545.8; Other:240; IV Piggyback:110] Out: -    Intake/Output this shift:     General: Ill appearing, unkempt no change from yesterday Head: puffy face  Neck: Supple.  Lungs: clear  Heart: RRR with S1 S2. No murmurs, rubs, or gallops appreciated.  Abdomen: Soft, not significantly-tender, non-distended with normoactive bowel sounds. No rebound/guarding. No obvious abdominal masses.  Lower extremities: 1 + LE  Neuro: awake and alert    Basic Metabolic Panel:  Lab 10/06/11 4098 10/05/11 0700 10/04/11 0515 10/03/11 1852 10/03/11 1851 10/03/11 1840  NA 132* 129* 132* -- 128* 125*  K 3.2* 4.0 5.1 5.3* 5.2* --  CL 90* 88* 89* -- 85* 97  CO2 25 23 24  -- 25 --  GLUCOSE 116* 151* 101* -- 115* 114*  BUN 75* 86* 89* -- 86* 108*  CREATININE 6.24* 7.46* 8.76* -- 8.74* 9.30*  CALCIUM 9.0 9.0 9.5 -- -- --  MG -- -- -- -- -- --  PHOS -- 6.1* -- -- -- --    Liver Function Tests:  Lab 10/06/11 0630 10/05/11 0700 10/03/11 1851  AST 43* -- 19  ALT 56* -- 18  ALKPHOS 369* -- 282*  BILITOT 0.4 -- 0.3  PROT 5.3* -- 6.3  ALBUMIN 1.3* 1.4* 2.0*    Lab 10/05/11 0700 10/03/11 1851  LIPASE 10* 10*  AMYLASE -- --    Lab 10/06/11 0630 10/05/11 1854  AMMONIA 53 54    CBC:  Lab 10/06/11 0630 10/05/11 0700 10/04/11 0515  10/04/11 0056 10/03/11 1851  WBC 13.1* 17.2* 21.0* 21.7* 23.8*  NEUTROABS PENDING -- -- 20.3* 22.3*  HGB 7.6* 7.5* 8.2* 8.7* 8.8*  HCT 23.3* 22.1* 24.8* 26.3* 26.5*  MCV 86.6 84.7 85.5 88.0 86.9  PLT PENDING 83* 115* 134* 121*    Cardiac Enzymes:  Lab 10/05/11 1843  CKTOTAL 10  CKMB 1.5  CKMBINDEX --  TROPONINI <0.30    BNP: No components found with this basename: POCBNP:5  CBG:  Lab 10/06/11 0755 10/06/11 0407 10/06/11 0020 10/05/11 1935 10/05/11 1642  GLUCAP 130* 148* 129* 162* 146*    Microbiology: Results for orders placed during the hospital encounter of 10/03/11  CULTURE, BLOOD (ROUTINE X 2)     Status: Normal (Preliminary result)   Collection Time   10/04/11  2:15 AM      Component Value Range Status Comment   Specimen Description BLOOD RIGHT FINGER   Final    Special Requests BOTTLES DRAWN AEROBIC ONLY [facility]   Final    Culture  Setup Time [id number]   Final    Culture     Final    Value: GRAM POSITIVE COCCI IN CLUSTERS     Note: Gram Stain Report Called  to,Read Back By and Verified With: Elsie Lincoln RN on 10/05/11 at 00:35 by Christie Nottingham   Report Status PENDING   Incomplete   CULTURE, BLOOD (ROUTINE X 2)     Status: Normal (Preliminary result)   Collection Time   10/04/11  2:30 AM      Component Value Range Status Comment   Specimen Description BLOOD RIGHT HAND   Final    Special Requests BOTTLES DRAWN AEROBIC ONLY 5CC   Final    Culture  Setup Time [id number]   Final    Culture     Final    Value: GRAM POSITIVE COCCI IN CLUSTERS     Note: Gram Stain Report Called to,Read Back By and Verified With: Elsie Lincoln RN on 10/05/11 at 00:35 by Christie Nottingham   Report Status PENDING   Incomplete   BODY FLUID CULTURE     Status: Normal (Preliminary result)   Collection Time   10/04/11  6:45 AM      Component Value Range Status Comment   Specimen Description PERITONEAL FLUID   Final    Special Requests 1 CUP   Final    Gram Stain     Final    Value: WBC PRESENT,  PREDOMINANTLY PMN     NO ORGANISMS SEEN   Culture NO GROWTH 1 DAY   Final    Report Status PENDING   Incomplete     Coagulation Studies: No results found for this basename: LABPROT:5,INR:5 in the last 72 hours  Urinalysis:  Basename 10/03/11 2132  COLORURINE AMBER*  LABSPEC 1.024  PHURINE 5.0  GLUCOSEU NEGATIVE  HGBUR NEGATIVE  BILIRUBINUR SMALL*  KETONESUR 15*  PROTEINUR 100*  UROBILINOGEN 0.2  NITRITE NEGATIVE  LEUKOCYTESUR SMALL*      Imaging: Ct Head Wo Contrast  10/05/2011  *RADIOLOGY REPORT*  Clinical Data: Fall, confusion  CT HEAD WITHOUT CONTRAST  Technique:  Contiguous axial images were obtained from the base of the skull through the vertex without contrast.  Comparison: Brain MRI 08/06/2003, head CT 07/02/2003  Findings: No acute hemorrhage, acute infarction, or mass lesion is identified.  No midline shift.  No ventriculomegaly.  No skull fracture.  CSF density in the region of the sella turcica again incidentally noted.  Evidence of chronic right maxillary sinusitis with osseous sclerosis but no air-fluid level or focal calcification.  IMPRESSION: No acute intracranial finding.  Original Report Authenticated By: Harrel Lemon, M.D.   Dg Chest Port 1 View  10/05/2011  *RADIOLOGY REPORT*  Clinical Data: Congestive heart failure.  Chronic renal failure on dialysis.  Altered mental status.  Follow-up pulmonary edema.  PORTABLE CHEST - 1 VIEW  Comparison: 06/02/2011  Findings: There has been interval placement of a triple lead AICD, which is seen in appropriate position.  No evidence of pneumothorax or pleural effusion.  Moderate cardiomegaly stable.  Diffuse bilateral pulmonary air space disease shows no significant change, consistent with diffuse pulmonary edema.  Decreased pulmonary consolidation is seen in the inferior aspect of the right upper lobe since prior exam.  IMPRESSION:  1.  No significant change in cardiomegaly and diffuse pulmonary edema, consistent with  congestive heart failure. 2.  Decreased air space disease in the inferior right upper lobe. 3.  New AICD in appropriate position.  No evidence of pneumothorax.  Original Report Authenticated By: Danae Orleans, M.D.     Medications:      . sodium chloride 50 mL/hr at 10/06/11 0215      .  ampicillin-sulbactam (UNASYN) IV  3 g Intravenous QHS  . carvedilol  6.25 mg Oral BID WC  . darbepoetin (ARANESP) injection - NON-DIALYSIS  40 mcg Subcutaneous Q Sun-1800  . doxazosin  2 mg Oral QHS  . feeding supplement  1 Container Oral TID BM  . fentaNYL  25 mcg Transdermal Q72H  . insulin aspart  0-9 Units Subcutaneous Q4H  . multivitamin  1 tablet Oral QHS  . pantoprazole (PROTONIX) IV  40 mg Intravenous Q24H  . ranolazine  500 mg Oral Daily  . sevelamer  3,200 mg Oral TID WC  . simvastatin  10 mg Oral q1800  . sodium chloride  3 mL Intravenous Q12H  . DISCONTD: fentaNYL  50 mcg Transdermal Q72H   acetaminophen, acetaminophen, HYDROmorphone, hydrOXYzine, ondansetron (ZOFRAN) IV, ondansetron, zolpidem, DISCONTD: hydrOXYzine  Assessment/ Plan:  ESRD- CAPD 1.5% bags blood pressure a little low  HTN controlled  Anemia stable  Abdominal pain PD may be aggravating although patient is responding to antimicrobials   Staph sepsis agree with 2D echo no source.   LOS: 3 [name] W @TODAY @8 :31 AM

## 2011-10-06 NOTE — Significant Event (Signed)
CRITICAL VALUE ALERT  Critical value received:  Positive blood cultures with MRSA   Date of notification:  10/06/11  Time of notification:  1430  Critical value read back:yes  Nurse who received alert:  Janeth Rase  MD notified (1st page):  DR. Laural Benes  Time of first page:  1530  MD notified (2nd page):  Time of second page:  Responding MD: NO RESPONSE NEEDED  Time MD responded:    Laverda Sorenson, RN

## 2011-10-06 NOTE — Progress Notes (Signed)
Utilization review completed. Payson Crumby RN, CCM  

## 2011-10-06 NOTE — Progress Notes (Addendum)
[facility] Progress Note  10/06/2011  Interval History: [name] is a 61 y.o. female.with ESRD presumed secondary to DM and HTN on PD who reportedly receives her care at the [facility]. She was recently discharged following an admission for pancreatic psuedocyst 4/05 with a prior admission at the [facility] for hematemesis in early March of this year. She presented to the ED with worsening abdominal pain. She had a presentation concerning for SBP.  Peritoneal tap and fluid neg for infection so far.  Pt did have positive blood cultures for staph.  She has responded well to IV antibiotics.  On admission she was not able to give a history at that time and is presently quite confused and unable to answer hardly any questions.  I have reduced the dose of her duragesic patch by 50% and will further reduce if needed.  She was a neg CT.  She has congestive heart failure and CAD.  Pt lives alone and will need SNF placement at discharge.  Family has traveled from [place] and [place] to see pt and available by telephone.  Dr. [name] has recommended pt go to a tertiary facility to have drainage of pancreatic pseudocyts and had planned to set up for pt on outpatient basis.   This can be resumed when pt stabilized.    Subjective: Pt more alert today.  Pt says she isn't having a good day.  She is aware that she is at [facility].    Objective:  Vital signs in last 24 hours: Filed Vitals:   10/06/11 0213 10/06/11 0615 10/06/11 0945 10/06/11 1100  BP: 97/63 95/65 97/64  95/64  Pulse: 86 90 101 80  Temp:  98.7 F (37.1 C) 98.5 F (36.9 C)   TempSrc:  Oral Oral   Resp:  16 20   Height:      Weight:      SpO2:  97% 97%    Weight change: -0.5 kg (-1 lb 1.6 oz)  Intake/Output Summary (Last 24 hours) at 10/06/11 1315 Last data filed at 10/06/11 0900  Gross per 24 hour  Intake 1255.84 ml  Output      0 ml  Net 1255.84 ml   Lab Results  Component Value Date   HGBA1C 5.7* 10/04/2011   HGBA1C 5.2  06/03/2011   HGBA1C  Value: 4.8 (NOTE)                                                                       According to the ADA Clinical Practice Recommendations for 2011, when HbA1c is used as a screening test:   >=6.5%   Diagnostic of Diabetes Mellitus           (if abnormal result  is confirmed)  5.7-6.4%   Increased risk of developing Diabetes Mellitus  References:Diagnosis and Classification of Diabetes Mellitus,Diabetes Care,2011,34(Suppl 1):S62-S69 and Standards of Medical Care in         Diabetes - 2011,Diabetes Care,2011,34  (Suppl 1):S11-S61. 11/10/2010   Lab Results  Component Value Date   [facility]  Value: 56        Total Cholesterol/HDL:CHD Risk Coronary Heart Disease Risk Table  Men   Women  1/2 Average Risk   3.4   3.3  Average Risk       5.0   4.4  2 X Average Risk   9.6   7.1  3 X Average Risk  23.4   11.0        Use the calculated Patient Ratio above and the CHD Risk Table to determine the patient's CHD Risk.        ATP III CLASSIFICATION (LDL):  <100     mg/dL   Optimal  161-096  mg/dL   Near or Above                    Optimal  130-159  mg/dL   Borderline  045-409  mg/dL   High  >811     mg/dL   Very High 02/27/7828   CREATININE 6.24* 10/06/2011    Review of Systems As above, otherwise all reviewed and reported negative  Physical Exam General - pt less lethargic and confused, no distress, appears comfortable  HEENT - NCAT, MM dry  Lungs - BBS, shallow  CV - normal s1, s2 sounds  Abd - soft, Mild distended, no masses, normal bs  Ext - 1+ edema bilateral LEs.   Lab Results: Results for orders placed during the hospital encounter of 10/03/11 (from the past 24 hour(s))  GLUCOSE, CAPILLARY     Status: Abnormal   Collection Time   10/05/11  4:42 PM      Component Value Range   Glucose-Capillary 146 (*) 70 - 99 (mg/dL)  CARDIAC PANEL(CRET KIN+CKTOT+MB+TROPI)     Status: Normal   Collection Time   10/05/11  6:43 PM      Component Value Range   Total CK 10  7 -  177 (U/L)   CK, MB 1.5  0.3 - 4.0 (ng/mL)   Troponin I <0.30  <0.30 (ng/mL)   Relative Index RELATIVE INDEX IS INVALID  0.0 - 2.5   AMMONIA     Status: Normal   Collection Time   10/05/11  6:54 PM      Component Value Range   Ammonia 54  11 - 60 (umol/L)  GLUCOSE, CAPILLARY     Status: Abnormal   Collection Time   10/05/11  7:35 PM      Component Value Range   Glucose-Capillary 162 (*) 70 - 99 (mg/dL)  GLUCOSE, CAPILLARY     Status: Abnormal   Collection Time   10/06/11 12:20 AM      Component Value Range   Glucose-Capillary 129 (*) 70 - 99 (mg/dL)  GLUCOSE, CAPILLARY     Status: Abnormal   Collection Time   10/06/11  4:07 AM      Component Value Range   Glucose-Capillary 148 (*) 70 - 99 (mg/dL)   Comment 1 Notify RN    AMMONIA     Status: Normal   Collection Time   10/06/11  6:30 AM      Component Value Range   Ammonia 53  11 - 60 (umol/L)  CBC     Status: Abnormal   Collection Time   10/06/11  6:30 AM      Component Value Range   WBC 13.1 (*) 4.0 - 10.5 (K/uL)   RBC 2.69 (*) 3.87 - 5.11 (MIL/uL)   Hemoglobin 7.6 (*) 12.0 - 15.0 (g/dL)   HCT 56.2 (*) 13.0 - 46.0 (%)   MCV 86.6  78.0 - 100.0 (fL)   MCH  28.3  26.0 - 34.0 (pg)   MCHC 32.6  30.0 - 36.0 (g/dL)   RDW 09.8 (*) 11.9 - 15.5 (%)   Platelets 58 (*) 150 - 400 (K/uL)  DIFFERENTIAL     Status: Abnormal   Collection Time   10/06/11  6:30 AM      Component Value Range   Neutrophils Relative 91 (*) 43 - 77 (%)   Lymphocytes Relative 4 (*) 12 - 46 (%)   Monocytes Relative 5  3 - 12 (%)   Eosinophils Relative 0  0 - 5 (%)   Basophils Relative 0  0 - 1 (%)   Neutro Abs 11.9 (*) 1.7 - 7.7 (K/uL)   Lymphs Abs 0.5 (*) 0.7 - 4.0 (K/uL)   Monocytes Absolute 0.7  0.1 - 1.0 (K/uL)   Eosinophils Absolute 0.0  0.0 - 0.7 (K/uL)   Basophils Absolute 0.0  0.0 - 0.1 (K/uL)   RBC Morphology TARGET CELLS     WBC Morphology MILD LEFT SHIFT (1-5% METAS, OCC MYELO, OCC BANDS)    COMPREHENSIVE METABOLIC PANEL     Status: Abnormal    Collection Time   10/06/11  6:30 AM      Component Value Range   Sodium 132 (*) 135 - 145 (mEq/L)   Potassium 3.2 (*) 3.5 - 5.1 (mEq/L)   Chloride 90 (*) 96 - 112 (mEq/L)   CO2 25  19 - 32 (mEq/L)   Glucose, Bld 116 (*) 70 - 99 (mg/dL)   BUN 75 (*) 6 - 23 (mg/dL)   Creatinine, Ser 1.47 (*) 0.50 - 1.10 (mg/dL)   Calcium 9.0  8.4 - 82.9 (mg/dL)   Total Protein 5.3 (*) 6.0 - 8.3 (g/dL)   Albumin 1.3 (*) 3.5 - 5.2 (g/dL)   AST 43 (*) 0 - 37 (U/L)   ALT 56 (*) 0 - 35 (U/L)   Alkaline Phosphatase 369 (*) 39 - 117 (U/L)   Total Bilirubin 0.4  0.3 - 1.2 (mg/dL)   GFR calc non Af Amer 6 (*) >90 (mL/min)   GFR calc Af Amer 7 (*) >90 (mL/min)  GLUCOSE, CAPILLARY     Status: Abnormal   Collection Time   10/06/11  7:55 AM      Component Value Range   Glucose-Capillary 130 (*) 70 - 99 (mg/dL)   Comment 1 Notify RN     Comment 2 Documented in Chart    GLUCOSE, CAPILLARY     Status: Abnormal   Collection Time   10/06/11 11:37 AM      Component Value Range   Glucose-Capillary 210 (*) 70 - 99 (mg/dL)    Micro Results: Recent Results (from the past 240 hour(s))  CULTURE, BLOOD (ROUTINE X 2)     Status: Normal (Preliminary result)   Collection Time   10/04/11  2:15 AM      Component Value Range Status Comment   Specimen Description BLOOD RIGHT FINGER   Final    Special Requests BOTTLES DRAWN AEROBIC ONLY [facility]   Final    Culture  Setup Time [id number]   Final    Culture     Final    Value: STAPHYLOCOCCUS AUREUS     Note: RIFAMPIN AND GENTAMICIN SHOULD NOT BE USED AS SINGLE DRUGS FOR TREATMENT OF STAPH INFECTIONS.     Note: Gram Stain Report Called to,Read Back By and Verified With: Elsie Lincoln RN on 10/05/11 at 00:35 by Christie Nottingham   Report Status PENDING   Incomplete  CULTURE, BLOOD (ROUTINE X 2)     Status: Normal (Preliminary result)   Collection Time   10/04/11  2:30 AM      Component Value Range Status Comment   Specimen Description BLOOD RIGHT HAND   Final    Special Requests BOTTLES  DRAWN AEROBIC ONLY 5CC   Final    Culture  Setup Time [id number]   Final    Culture     Final    Value: STAPHYLOCOCCUS AUREUS     Note: Gram Stain Report Called to,Read Back By and Verified With: Elsie Lincoln RN on 10/05/11 at 00:35 by Christie Nottingham   Report Status PENDING   Incomplete   BODY FLUID CULTURE     Status: Normal (Preliminary result)   Collection Time   10/04/11  6:45 AM      Component Value Range Status Comment   Specimen Description PERITONEAL FLUID   Final    Special Requests 1 CUP   Final    Gram Stain     Final    Value: WBC PRESENT, PREDOMINANTLY PMN     NO ORGANISMS SEEN   Culture NO GROWTH 2 DAYS   Final    Report Status PENDING   Incomplete     Medications:  Scheduled Meds:   . ampicillin-sulbactam (UNASYN) IV  3 g Intravenous QHS  . carvedilol  6.25 mg Oral BID WC  . darbepoetin (ARANESP) injection - NON-DIALYSIS  40 mcg Subcutaneous Q Sun-1800  . doxazosin  2 mg Oral QHS  . feeding supplement  1 Container Oral TID BM  . fentaNYL  25 mcg Transdermal Q72H  . insulin aspart  0-9 Units Subcutaneous Q4H  . multivitamin  1 tablet Oral QHS  . pantoprazole (PROTONIX) IV  40 mg Intravenous Q24H  . ranolazine  500 mg Oral Daily  . sevelamer  3,200 mg Oral TID WC  . simvastatin  10 mg Oral q1800  . sodium chloride  3 mL Intravenous Q12H  . DISCONTD: fentaNYL  50 mcg Transdermal Q72H   Continuous Infusions:   . sodium chloride 50 mL/hr at 10/06/11 0215   PRN Meds:.acetaminophen, acetaminophen, HYDROmorphone, hydrOXYzine, ondansetron (ZOFRAN) IV, ondansetron, zolpidem, DISCONTD: hydrOXYzine  Assessment/Plan: .Epigastric abdominal pain, improving, concerning for SBP (suspicious fluid recovered from tap) - continue IV abx, cultures and cytology pending, ok with clears if pt tolerates Staphylococcus bacteremia - continue Vancomycin IV.  Marland KitchenPancreatitis chronic with pseudocysts  - Per GI recs, pt will need to go to a tertiary care facility for drainage. According to  family, Dr. [name] to arrange outpt. Pursue when pt is more stable.   .End stage renal disease - continue inpt PD and followed by nephrology .Ischemic cardiomyopathy - DC IVF, lasix IV, follow I/Os.  Marland KitchenChronic systolic congestive heart failure - monitor I/Os.  Marland KitchenLBBB (left bundle branch block) - stable Anemia of chronic disease - following closely.  Hyponatremia - following, hold IVFs, saline lock IV.  Hypokalemia - replace p.o. Hyperkalemia - resolved now..   Diabetes mellitis -bs slightly elevated now that pt eating, monitor BS closely. See orders. Altered Mental Status - pt closer to baseline today.  I believe she was on way too much duragesic patch, I reduced the dose by 50% and she is better and not complaining of pain.  Reduce dose further if pt continues to have confusion and lethargy.   I had a long discussion with family and updated them on her progress and treatment plan, they verbalized understanding.  LOS: 3 days   Willine Schwalbe 10/06/2011, 1:15 PM   Cleora Fleet, MD, CDE, FAAFP [facility], [place]  [phone]

## 2011-10-06 NOTE — Progress Notes (Signed)
Pt was noted with an o2 sat 85 on room air. Pt presented with no distress. Pt placed on 2L [place] and sats increased to 98%. Pt resting quietly. Denies any sob. Will cont to monitor.

## 2011-10-07 DIAGNOSIS — I059 Rheumatic mitral valve disease, unspecified: Secondary | ICD-10-CM

## 2011-10-07 LAB — COMPREHENSIVE METABOLIC PANEL
ALT: 35 U/L (ref 0–35)
Albumin: 1.2 g/dL — ABNORMAL LOW (ref 3.5–5.2)
Alkaline Phosphatase: 385 U/L — ABNORMAL HIGH (ref 39–117)
Chloride: 90 mEq/L — ABNORMAL LOW (ref 96–112)
Potassium: 3.6 mEq/L (ref 3.5–5.1)
Sodium: 130 mEq/L — ABNORMAL LOW (ref 135–145)
Total Bilirubin: 0.4 mg/dL (ref 0.3–1.2)
Total Protein: 5.6 g/dL — ABNORMAL LOW (ref 6.0–8.3)

## 2011-10-07 LAB — CULTURE, BLOOD (ROUTINE X 2): Culture  Setup Time: 201304211130

## 2011-10-07 LAB — GLUCOSE, CAPILLARY
Glucose-Capillary: 194 mg/dL — ABNORMAL HIGH (ref 70–99)
Glucose-Capillary: 234 mg/dL — ABNORMAL HIGH (ref 70–99)
Glucose-Capillary: 197 mg/dL — ABNORMAL HIGH (ref 70–99)

## 2011-10-07 LAB — BODY FLUID CULTURE: Culture: NO GROWTH

## 2011-10-07 LAB — CBC
HCT: 24.8 % — ABNORMAL LOW (ref 36.0–46.0)
MCH: 28.1 pg (ref 26.0–34.0)
MCHC: 32.7 g/dL (ref 30.0–36.0)
MCV: 86.1 fL (ref 78.0–100.0)
RDW: 19 % — ABNORMAL HIGH (ref 11.5–15.5)

## 2011-10-07 LAB — DIFFERENTIAL
Basophils Absolute: 0 10*3/uL (ref 0.0–0.1)
Eosinophils Absolute: 0 10*3/uL (ref 0.0–0.7)
Lymphs Abs: 0.5 10*3/uL — ABNORMAL LOW (ref 0.7–4.0)
Monocytes Absolute: 1.1 10*3/uL — ABNORMAL HIGH (ref 0.1–1.0)
Neutro Abs: 16.5 10*3/uL — ABNORMAL HIGH (ref 1.7–7.7)

## 2011-10-07 MED ORDER — PIPERACILLIN-TAZOBACTAM IN DEX 2-0.25 GM/50ML IV SOLN
2.2500 g | Freq: Three times a day (TID) | INTRAVENOUS | Status: DC
  Administered 2011-10-07 – 2011-10-08 (×3): 2.25 g via INTRAVENOUS
  Filled 2011-10-07 (×5): qty 50

## 2011-10-07 MED ORDER — HEPARIN SODIUM (PORCINE) 1000 UNIT/ML IJ SOLN
500.0000 [IU] | INTRAMUSCULAR | Status: DC | PRN
  Administered 2011-10-07 – 2011-10-08 (×3): 500 [IU] via INTRAPERITONEAL
  Administered 2011-10-11: 1000 [IU] via INTRAPERITONEAL
  Administered 2011-10-11 (×2): 500 [IU] via INTRAPERITONEAL
  Filled 2011-10-07 (×21): qty 0.5

## 2011-10-07 MED ORDER — VANCOMYCIN HCL 1000 MG IV SOLR
1750.0000 mg | Freq: Once | INTRAVENOUS | Status: AC
  Administered 2011-10-07: 1750 mg via INTRAVENOUS
  Filled 2011-10-07: qty 1750

## 2011-10-07 MED ORDER — SODIUM CHLORIDE 0.9 % IV SOLN
Freq: Once | INTRAVENOUS | Status: AC
  Administered 2011-10-07: 07:00:00 via INTRAVENOUS

## 2011-10-07 NOTE — Progress Notes (Signed)
Received call from MD on call,K. Kirby,orders received to do EKG,give bolus of normal saline 500cc and recheck BP post bolus.Patient's HR at this time is down to 97.Will continue to monitor. Joni Norrod Joselita,RN

## 2011-10-07 NOTE — Progress Notes (Signed)
I explained to pt about how the doctor wanted her to get a NG tube. Pt refused said she didn't want it. I explained that she would need to start eating more of her meals. So I helped her eat some of her lunch but she said that she was in pain and didn't feel like eating much, MD aware. Will continue to monitor.

## 2011-10-07 NOTE — Progress Notes (Signed)
[name] is a 61 y.o. female admitted with nausea and vomiting, and found to have MRSA bacteremia in setting of peritoneal dialysis, chronic pancreatitis with pancreatic pseudocyst, some peritoneal fluid, ef 20%, encephalopathy. I have reviewed her chart, seen and examined her at bed side, discussed with Dr Hyman Hopes and Dr Daiva Eves. Appreciate neurology assistance. Patient unable to give me meaningful history due to confusion.   1. Pancreatic pseudocyst   2. Epigastric abdominal pain   3. End stage renal disease     Past Medical History  Diagnosis Date  . Gallstone pancreatitis   . Coronary artery disease     s/p CABG 2008 with multiple PCIs  . Ischemic cardiomyopathy   . End stage renal disease   . Diabetes mellitus   . Hypertension   . History of nonadherence to medical treatment   . LBBB (left bundle branch block)   . Hyperlipidemia   . Renal insufficiency   . CHF (congestive heart failure)   . Dialysis patient   . Anemia    Current Facility-Administered Medications  Medication Dose Route Frequency Provider Last Rate Last Dose  . 0.9 %  sodium chloride infusion   Intravenous Once Caroline More, NP 500 mL/hr at 10/07/11 9562    . acetaminophen (TYLENOL) tablet 650 mg  650 mg Oral Q6H PRN Ron Parker, MD       Or  . acetaminophen (TYLENOL) suppository 650 mg  650 mg Rectal Q6H PRN Ron Parker, MD      . carvedilol (COREG) tablet 6.25 mg  6.25 mg Oral BID WC Ron Parker, MD   6.25 mg at 10/07/11 0907  . darbepoetin (ARANESP) injection 40 mcg  40 mcg Subcutaneous Q Sun-1800 Sheffield Slider, PA   40 mcg at 10/04/11 1704  . doxazosin (CARDURA) tablet 2 mg  2 mg Oral QHS Ron Parker, MD   2 mg at 10/05/11 2205  . feeding supplement (RESOURCE BREEZE) liquid 1 Container  1 Container Oral TID BM Sheffield Slider, PA   1 Container at 10/07/11 1400  . heparin injection 500 Units  500 Units Intraperitoneal PRN Zada Girt, MD   500 Units at 10/07/11 0935    . HYDROmorphone (DILAUDID) injection 2 mg  2 mg Intravenous Q3H PRN Ron Parker, MD   2 mg at 10/07/11 1615  . hydrOXYzine (ATARAX/VISTARIL) tablet 10 mg  10 mg Oral Q12H PRN Clanford L Johnson, MD      . insulin aspart (novoLOG) injection 0-9 Units  0-9 Units Subcutaneous TID WC Clanford Cyndie Mull, MD   3 Units at 10/07/11 1237  . insulin glargine (LANTUS) injection 5 Units  5 Units Subcutaneous Daily Clanford Cyndie Mull, MD   5 Units at 10/07/11 1050  . multivitamin (RENA-VIT) tablet 1 tablet  1 tablet Oral QHS Rhetta Mura, MD   1 tablet at 10/06/11 2224  . ondansetron (ZOFRAN) tablet 4 mg  4 mg Oral Q6H PRN Ron Parker, MD       Or  . ondansetron (ZOFRAN) injection 4 mg  4 mg Intravenous Q6H PRN Ron Parker, MD   4 mg at 10/04/11 0415  . pantoprazole (PROTONIX) injection 40 mg  40 mg Intravenous Q24H Ron Parker, MD   40 mg at 10/06/11 2340  . piperacillin-tazobactam (ZOSYN) IVPB 2.25 g  2.25 g Intravenous Q8H Indira Sorenson, MD   2.25 g at 10/07/11 1303  . potassium chloride SA (K-DUR,KLOR-CON) CR tablet  40 mEq  40 mEq Oral BID Clanford Cyndie Mull, MD   40 mEq at 10/07/11 1052  . ranolazine (RANEXA) 12 hr tablet 500 mg  500 mg Oral Daily Ron Parker, MD   500 mg at 10/07/11 1052  . sevelamer (RENAGEL) tablet 3,200 mg  3,200 mg Oral TID WC Ron Parker, MD   3,200 mg at 10/07/11 0914  . simvastatin (ZOCOR) tablet 10 mg  10 mg Oral q1800 Ron Parker, MD   10 mg at 10/06/11 1755  . sodium chloride 0.9 % injection 3 mL  3 mL Intravenous Q12H Ron Parker, MD   3 mL at 10/07/11 1041  . vancomycin (VANCOCIN) 1,750 mg in sodium chloride 0.9 % 500 mL IVPB  1,750 mg Intravenous Once Runell Kovich, MD   1,750 mg at 10/07/11 1041  . zolpidem (AMBIEN) tablet 5 mg  5 mg Oral QHS PRN Ron Parker, MD      . DISCONTD: Ampicillin-Sulbactam (UNASYN) 3 g in sodium chloride 0.9 % 100 mL IVPB  3 g Intravenous QHS Rhetta Mura, MD   3 g at  10/06/11 2224  . DISCONTD: fentaNYL (DURAGESIC - dosed mcg/hr) patch 25 mcg  25 mcg Transdermal Q72H Clanford Cyndie Mull, MD   25 mcg at 10/07/11 0911   Allergies  Allergen Reactions  . Morphine And Related Anaphylaxis  . Codeine Itching  . Lisinopril     Language changed   Principal Problem:  *Epigastric abdominal pain Active Problems:  Ischemic cardiomyopathy  Chronic systolic congestive heart failure  End stage renal disease  Pancreatic pseudocyst  Pancreatitis chronic  Hyperkalemia  Hyponatremia   Vital signs in last 24 hours: Temp:  [98 F (36.7 C)-99.8 F (37.7 C)] 98 F (36.7 C) (04/24 1433) Pulse Rate:  [87-165] 87  (04/24 1433) Resp:  [18-24] 22  (04/24 1433) BP: (82-145)/(53-90) 98/56 mmHg (04/24 1433) SpO2:  [95 %-99 %] 99 % (04/24 1433) Weight:  [66.9 kg (147 lb 7.8 oz)] 66.9 kg (147 lb 7.8 oz) (04/23 2145) Weight change: 1.9 kg (4 lb 3 oz) Last BM Date: 10/07/11  Intake/Output from previous day: 04/23 0701 - 04/24 0700 In: 580 [P.O.:480; IV Piggyback:100] Out: 0  Intake/Output this shift: Total I/O In: 1100 [P.O.:600; IV Piggyback:500] Out: 0   Lab Results:  Basename 10/07/11 0600 10/06/11 0630  WBC 18.1* 13.1*  HGB 8.1* 7.6*  HCT 24.8* 23.3*  PLT 58* 58*   BMET  Basename 10/07/11 0600 10/06/11 0630  NA 130* 132*  K 3.6 3.2*  CL 90* 90*  CO2 24 25  GLUCOSE 193* 116*  BUN 67* 75*  CREATININE 5.43* 6.24*  CALCIUM 9.4 9.0    Studies/Results: Dg Chest 1 View  10/06/2011  *RADIOLOGY REPORT*  Clinical Data: Follow-up congestive heart failure. Altered mental status.  Coronary artery disease.  CHEST - 1 VIEW  Comparison: 10/05/2011  Findings: Cardiomegaly stable.  Diffuse bilateral pulmonary air space disease shows no significant interval change.  AICD remains in appropriate position.  Prior CABG again noted.  IMPRESSION: Stable cardiomegaly and bilateral diffuse pulmonary air space disease/edema.  Original Report Authenticated By: Danae Orleans,  M.D.   Ct Head Wo Contrast  10/05/2011  *RADIOLOGY REPORT*  Clinical Data: Fall, confusion  CT HEAD WITHOUT CONTRAST  Technique:  Contiguous axial images were obtained from the base of the skull through the vertex without contrast.  Comparison: Brain MRI 08/06/2003, head CT 07/02/2003  Findings: No acute hemorrhage, acute infarction, or mass  lesion is identified.  No midline shift.  No ventriculomegaly.  No skull fracture.  CSF density in the region of the sella turcica again incidentally noted.  Evidence of chronic right maxillary sinusitis with osseous sclerosis but no air-fluid level or focal calcification.  IMPRESSION: No acute intracranial finding.  Original Report Authenticated By: Harrel Lemon, M.D.   Dg Chest Port 1 View  10/05/2011  *RADIOLOGY REPORT*  Clinical Data: Congestive heart failure.  Chronic renal failure on dialysis.  Altered mental status.  Follow-up pulmonary edema.  PORTABLE CHEST - 1 VIEW  Comparison: 06/02/2011  Findings: There has been interval placement of a triple lead AICD, which is seen in appropriate position.  No evidence of pneumothorax or pleural effusion.  Moderate cardiomegaly stable.  Diffuse bilateral pulmonary air space disease shows no significant change, consistent with diffuse pulmonary edema.  Decreased pulmonary consolidation is seen in the inferior aspect of the right upper lobe since prior exam.  IMPRESSION:  1.  No significant change in cardiomegaly and diffuse pulmonary edema, consistent with congestive heart failure. 2.  Decreased air space disease in the inferior right upper lobe. 3.  New AICD in appropriate position.  No evidence of pneumothorax.  Original Report Authenticated By: Danae Orleans, M.D.    Medications: I have reviewed the patient's current medications.   Physical exam GENERAL- alert HEAD- normal atraumatic, no neck masses, normal thyroid, no jvd RESPIRATORY- appears well, vitals normal, no respiratory distress, acyanotic, normal RR,  ear and throat exam is normal, neck free of mass or lymphadenopathy, chest clear, no wheezing, crepitations, rhonchi, normal symmetric air entry CVS- regular rate and rhythm, S1, S2 normal, no murmur, click, rub or gallop ABDOMEN-peritoneal dialysis cath in place. Tenderness to palpation upper quadrants. NEURO- disoriented. EXTREMITIES- extremities normal, atraumatic, no cyanosis or edema  Plan   * MRSA bacteremia- ? Source. Will repeat blood cultures today per Dr Daiva Eves recommendations. Defer further work up to ID. Continue vanc/zosyn.  * Ischemic cardiomyopathy/ Chronic systolic congestive heart failure(20%)- stable.  * End stage renal disease-on PD. Appreciate renal. *  Pancreatic pseudocyst/ Pancreatitis chronic- tolerating feeding, refusing ngt. ?drain pseudocyst * encephalopathy- appreciate neuro. Dvt/gi prophylaxis.     Brietta Manso 10/07/2011 5:35 PM Pager: [phone].

## 2011-10-07 NOTE — Progress Notes (Signed)
PHARMACY - ANTIBIOTIC CONSULT  Follow-up Consult Note  Pharmacy Consult for: Vancomycin / Zosyn  Indication: MRSA sepsis / Pancreatitis   Patient Data:   Allergies: Allergies  Allergen Reactions  . Morphine And Related Anaphylaxis  . Codeine Itching  . Lisinopril     Language changed    Patient Measurements: Height: 5\' 4"  (162.6 cm) Weight: 147 lb 7.8 oz (66.9 kg) IBW/kg (Calculated) : 54.7    Vital Signs: Temp:  [97.4 F (36.3 C)-99 F (37.2 C)] 99 F (37.2 C) (04/24 0523) Pulse Rate:  [80-165] 116  (04/24 0820) Resp:  [18-24] 24  (04/24 0820) BP: (82-145)/(53-90) 145/90 mmHg (04/24 0820) SpO2:  [85 %-99 %] 99 % (04/24 0820) Weight:  [147 lb 7.8 oz (66.9 kg)] 147 lb 7.8 oz (66.9 kg) (04/23 2145)  BMI: Estimated Body mass index is 25.32 kg/(m^2) as calculated from the following:   Height as of this encounter: 5\' 4" (1.626 m).   Weight as of this encounter: 147 lb 7.8 oz(66.9 kg).  Intake/Output from previous day:  Intake/Output Summary (Last 24 hours) at 10/07/11 0921 Last data filed at 10/07/11 0714  Gross per 24 hour  Intake    340 ml  Output      0 ml  Net    340 ml    Labs:  Basename 10/07/11 0600 10/06/11 0630 10/05/11 0700  WBC 18.1* 13.1* 17.2*  HGB 8.1* 7.6* 7.5*  PLT 58* 58* 83*  LABCREA -- -- --  CREATININE 5.43* 6.24* 7.46*   Estimated Creatinine Clearance: 10.1 ml/min (by C-G formula based on Cr of 5.43).  Basename 10/07/11 0600  VANCOTROUGH --  VANCOPEAK --  VANCORANDOM 20.4  GENTTROUGH --  GENTPEAK --  GENTRANDOM --  TOBRATROUGH --  TOBRAPEAK --  TOBRARND --  AMIKACINPEAK --  AMIKACINTROU --  AMIKACIN --     Microbiology: Recent Results (from the past 720 hour(s))  MRSA PCR SCREENING     Status: Normal   Collection Time   09/14/11  8:49 AM      Component Value Range Status Comment   MRSA by PCR NEGATIVE  NEGATIVE  Final   CULTURE, BLOOD (ROUTINE X 2)     Status: Normal   Collection Time   10/04/11  2:15 AM      Component  Value Range Status Comment   Specimen Description BLOOD RIGHT FINGER   Final    Special Requests BOTTLES DRAWN AEROBIC ONLY [facility]   Final    Culture  Setup Time [id number]   Final    Culture     Final    Value: METHICILLIN RESISTANT STAPHYLOCOCCUS AUREUS     Note: RIFAMPIN AND GENTAMICIN SHOULD NOT BE USED AS SINGLE DRUGS FOR TREATMENT OF STAPH INFECTIONS. This organism DOES NOT demonstrate inducible Clindamycin resistance in vitro. CRITICAL RESULT CALLED TO, READ BACK BY AND VERIFIED WITH: JENAE M @ 1430      10/06/11 BY KRAWS     Note: Gram Stain Report Called to,Read Back By and Verified With: Elsie Lincoln RN on 10/05/11 at 00:35 by Christie Nottingham   Report Status 10/07/2011 FINAL   Final    Organism ID, Bacteria METHICILLIN RESISTANT STAPHYLOCOCCUS AUREUS   Final   CULTURE, BLOOD (ROUTINE X 2)     Status: Normal   Collection Time   10/04/11  2:30 AM      Component Value Range Status Comment   Specimen Description BLOOD RIGHT HAND   Final    Special Requests BOTTLES DRAWN  AEROBIC ONLY 5CC   Final    Culture  Setup Time [id number]   Final    Culture     Final    Value: STAPHYLOCOCCUS AUREUS     Note: SUSCEPTIBILITIES PERFORMED ON PREVIOUS CULTURE WITHIN THE LAST 5 DAYS.     Note: Gram Stain Report Called to,Read Back By and Verified With: Elsie Lincoln RN on 10/05/11 at 00:35 by Christie Nottingham   Report Status 10/07/2011 FINAL   Final   BODY FLUID CULTURE     Status: Normal (Preliminary result)   Collection Time   10/04/11  6:45 AM      Component Value Range Status Comment   Specimen Description PERITONEAL FLUID   Final    Special Requests 1 CUP   Final    Gram Stain     Final    Value: WBC PRESENT, PREDOMINANTLY PMN     NO ORGANISMS SEEN   Culture NO GROWTH 2 DAYS   Final    Report Status PENDING   Incomplete     Medical History: Past Medical History  Diagnosis Date  . Gallstone pancreatitis   . Coronary artery disease     s/p CABG 2008 with multiple PCIs  . Ischemic cardiomyopathy     . End stage renal disease   . Diabetes mellitus   . Hypertension   . History of nonadherence to medical treatment   . LBBB (left bundle branch block)   . Hyperlipidemia   . Renal insufficiency   . CHF (congestive heart failure)   . Dialysis patient   . Anemia     Scheduled Medications:     . sodium chloride   Intravenous Once  . ampicillin-sulbactam (UNASYN) IV  3 g Intravenous QHS  . carvedilol  6.25 mg Oral BID WC  . darbepoetin (ARANESP) injection - NON-DIALYSIS  40 mcg Subcutaneous Q Sun-1800  . doxazosin  2 mg Oral QHS  . feeding supplement  1 Container Oral TID BM  . furosemide  40 mg Intravenous Once  . insulin aspart  0-9 Units Subcutaneous TID WC  . insulin glargine  5 Units Subcutaneous Daily  . multivitamin  1 tablet Oral QHS  . pantoprazole (PROTONIX) IV  40 mg Intravenous Q24H  . potassium chloride  40 mEq Oral BID  . ranolazine  500 mg Oral Daily  . sevelamer  3,200 mg Oral TID WC  . simvastatin  10 mg Oral q1800  . sodium chloride  3 mL Intravenous Q12H  . DISCONTD: fentaNYL  25 mcg Transdermal Q72H  . DISCONTD: insulin aspart  0-9 Units Subcutaneous Q4H  . DISCONTD: potassium chloride  40 mEq Oral Once    Assessment:  61 y.o. female admitted on 10/03/2011, with abdominal pain.  Vancomycin level this AM = 20.4 mcg/dl.  Continues on PD with ? Plans to change to HD.  Received dose of vancomycin 4.22.13.  Now adding Zosyn in place of Unasyn  Goal of Therapy:  1. Vancomycin trough level 15 to 20 mcg / dl 2. Appropriate Zosyn dosing  Plan:  1. Vancomycin 1750mg  IV x 1.  2. Vancomycin random level in 3-4 days. 3. Zosyn 2.25 grams IV Q 8 hours   Okey Regal, PharmD 10/07/2011, 9:21 AM

## 2011-10-07 NOTE — Consult Note (Signed)
[facility] CONSULT NOTE     Reason for Consult: confusion   HPI:    [name] is an 61 y.o. female with multiple medical problems who presented to the ED with worsening epigastric abdominal pain. CT scan withuot contrast was found to have a pancreatic pseudocyst which is larger than on recent previous CT scans. She was found also to be hyponatremic, hypokalemic, elevated creatinine, Hg 8.8/HCT 26.5, WBC 21.7 and BC (+) MRSA and confusion. On 4/22 patient had a episode of increased confusion during a period where her O2 saturation dropped to 92-94%. On 4/23 she was alert and oriented to place--this was after her Duragesic patch was decreased by 50%.   Neurology was asked to evaluate patient due to confusion.   On admission:  NA 128- now increased to 130,  K+ 5.3 now 3.6,  BUN 86- now 67,  Cr 8.74 now 5.43,  Hg 8.8/HCT 26.5 Alk phos 282 now 385,  WBC 21.7 now 18.1 Plt 134 now 58 Blood culture (+) MRSA  Initial CXR 1. No significant change in cardiomegaly and diffuse pulmonary edema, consistent with congestive heart failure. 2. Decreased air space disease in the inferior right upper lobe. 3. New AICD in appropriate position. No evidence of pneumothorax.    Past Medical History  Diagnosis Date  . Gallstone pancreatitis   . Coronary artery disease     s/p CABG 2008 with multiple PCIs  . Ischemic cardiomyopathy   . End stage renal disease   . Diabetes mellitus   . Hypertension   . History of nonadherence to medical treatment   . LBBB (left bundle branch block)   . Hyperlipidemia   . Renal insufficiency   . CHF (congestive heart failure)   . Dialysis patient   . Anemia     Past Surgical History  Procedure Date  . Coronary artery bypass graft 2008  . Cholecystectomy   . Abdominal hysterectomy   . Tubal ligation   . Tonsillectomy   . Insert / replace / remove pacemaker     pacemaker ICD  . Esophagogastroduodenoscopy 09/15/2011   Procedure: ESOPHAGOGASTRODUODENOSCOPY (EGD);  Surgeon: Theda Belfast, MD;  Location: [facility] ENDOSCOPY;  Service: Endoscopy;  Laterality: N/A;    Family History  Problem Relation Age of Onset  . Coronary artery disease Mother   . Hypertension Mother   . Diabetes Mother   . Diabetes Sister   . Anesthesia problems Neg Hx     Social History:  reports that she quit smoking about 11 years ago. She does not have any smokeless tobacco history on file. She reports that she does not drink alcohol or use illicit drugs.  Allergies  Allergen Reactions  . Morphine And Related Anaphylaxis  . Codeine Itching  . Lisinopril     Language changed    Medications:    Prior to Admission:  Prescriptions prior to admission  Medication Sig Dispense Refill  . carvedilol (COREG) 6.25 MG tablet Take 6.25 mg by mouth 2 (two) times daily with a meal.      . doxazosin (CARDURA) 2 MG tablet Take 2 mg by mouth at bedtime.      . fentaNYL (DURAGESIC - DOSED MCG/HR) 50 MCG/HR Place 1 patch onto the skin every 3 (three) days.      . insulin glargine (LANTUS) 100 UNIT/ML injection Inject 10 Units into the  skin daily as needed. Per sliding scale      . Multiple Vitamin (DAILY VITAMIN PO) Take 1 tablet by mouth daily.       . pravastatin (PRAVACHOL) 20 MG tablet Take 20 mg by mouth daily.        . ranolazine (RANEXA) 500 MG 12 hr tablet Take 500 mg by mouth daily.        . sevelamer (RENAGEL) 800 MG tablet Take 3,200 mg by mouth 3 (three) times daily with meals.        Scheduled:   . sodium chloride   Intravenous Once  . carvedilol  6.25 mg Oral BID WC  . darbepoetin (ARANESP) injection - NON-DIALYSIS  40 mcg Subcutaneous Q Sun-1800  . doxazosin  2 mg Oral QHS  . feeding supplement  1 Container Oral TID BM  . furosemide  40 mg Intravenous Once  . insulin aspart  0-9 Units Subcutaneous TID WC  . insulin glargine  5 Units Subcutaneous Daily  . multivitamin  1 tablet Oral QHS  . pantoprazole (PROTONIX) IV  40 mg  Intravenous Q24H  . piperacillin-tazobactam (ZOSYN)  IV  2.25 g Intravenous Q8H  . potassium chloride  40 mEq Oral BID  . ranolazine  500 mg Oral Daily  . sevelamer  3,200 mg Oral TID WC  . simvastatin  10 mg Oral q1800  . sodium chloride  3 mL Intravenous Q12H  . vancomycin  1,750 mg Intravenous Once  . DISCONTD: ampicillin-sulbactam (UNASYN) IV  3 g Intravenous QHS  . DISCONTD: fentaNYL  25 mcg Transdermal Q72H    Review of Systems - General ROS: negative for - chills, fatigue, fever or hot flashes Hematological and Lymphatic ROS: negative for - bruising, fatigue, jaundice or pallor Endocrine ROS: negative for - hair pattern changes, hot flashes, mood swings or skin changes Respiratory ROS: negative for - cough, hemoptysis, orthopnea or wheezing Cardiovascular ROS: negative for - dyspnea on exertion, orthopnea, palpitations or shortness of breath Gastrointestinal ROS: negative for - abdominal pain, appetite loss, blood in stools, diarrhea or hematemesis Musculoskeletal ROS: negative for - joint pain, joint stiffness, joint swelling or muscle pain Neurological ROS: See HPI Dermatological ROS: negative for dry skin, pruritus and rash   Blood pressure 104/73, pulse 99, temperature 98.2 F (36.8 C), temperature source Oral, resp. rate 24, height 5\' 4"  (1.626 m), weight 66.9 kg (147 lb 7.8 oz), SpO2 99.00%.   Neurologic Examination:   Mental Status: Drowsy, will not answer my questions, opens eyes to her voice but when asked to tell me her location she just moans.  Will not follow any verbal commands. Does not witdrawl from pain but says "ouch" to all four extremities.  Cranial Nerves: II-Visual fields grossly intact as she blinks to threat bilaterally. III/IV/VI-Extraocular movements intact.  Pupils reactive bilaterally. V/VII-Smile symmetric VIII-grossly intact --winces to pain XII-midline tongue extension Motor: Does not move extremities purposefully and will not withdrawal to  pain all extremities.  I did not note increased tone or rigidity. normal tone and bulk Sensory: Pinprick  intact throughout, bilaterally as she states "ouch" to pain all 4 extremities Deep Tendon Reflexes: 1+ and symmetric throughout UE, 2+ left KJ and 1+ right KJ, no AJ Plantars: Downgoing bilaterally   Lab Results  Component Value Date/Time   CHOL 162 11/10/2010  5:00 AM    Results for orders placed during the hospital encounter of 10/03/11 (from the past 48 hour(s))  GLUCOSE, CAPILLARY     Status: Abnormal  Collection Time   10/05/11  4:42 PM      Component Value Range Comment   Glucose-Capillary 146 (*) 70 - 99 (mg/dL)   CARDIAC PANEL(CRET KIN+CKTOT+MB+TROPI)     Status: Normal   Collection Time   10/05/11  6:43 PM      Component Value Range Comment   Total CK 10  7 - 177 (U/L)    CK, MB 1.5  0.3 - 4.0 (ng/mL)    Troponin I <0.30  <0.30 (ng/mL)    Relative Index RELATIVE INDEX IS INVALID  0.0 - 2.5    AMMONIA     Status: Normal   Collection Time   10/05/11  6:54 PM      Component Value Range Comment   Ammonia 54  11 - 60 (umol/L)   GLUCOSE, CAPILLARY     Status: Abnormal   Collection Time   10/05/11  7:35 PM      Component Value Range Comment   Glucose-Capillary 162 (*) 70 - 99 (mg/dL)   GLUCOSE, CAPILLARY     Status: Abnormal   Collection Time   10/06/11 12:20 AM      Component Value Range Comment   Glucose-Capillary 129 (*) 70 - 99 (mg/dL)   GLUCOSE, CAPILLARY     Status: Abnormal   Collection Time   10/06/11  4:07 AM      Component Value Range Comment   Glucose-Capillary 148 (*) 70 - 99 (mg/dL)    Comment 1 Notify RN     AMMONIA     Status: Normal   Collection Time   10/06/11  6:30 AM      Component Value Range Comment   Ammonia 53  11 - 60 (umol/L)   CBC     Status: Abnormal   Collection Time   10/06/11  6:30 AM      Component Value Range Comment   WBC 13.1 (*) 4.0 - 10.5 (K/uL)    RBC 2.69 (*) 3.87 - 5.11 (MIL/uL)    Hemoglobin 7.6 (*) 12.0 - 15.0 (g/dL)     HCT 16.1 (*) 09.6 - 46.0 (%)    MCV 86.6  78.0 - 100.0 (fL)    MCH 28.3  26.0 - 34.0 (pg)    MCHC 32.6  30.0 - 36.0 (g/dL)    RDW 04.5 (*) 40.9 - 15.5 (%)    Platelets 58 (*) 150 - 400 (K/uL)   DIFFERENTIAL     Status: Abnormal   Collection Time   10/06/11  6:30 AM      Component Value Range Comment   Neutrophils Relative 91 (*) 43 - 77 (%)    Lymphocytes Relative 4 (*) 12 - 46 (%)    Monocytes Relative 5  3 - 12 (%)    Eosinophils Relative 0  0 - 5 (%)    Basophils Relative 0  0 - 1 (%)    Neutro Abs 11.9 (*) 1.7 - 7.7 (K/uL)    Lymphs Abs 0.5 (*) 0.7 - 4.0 (K/uL)    Monocytes Absolute 0.7  0.1 - 1.0 (K/uL)    Eosinophils Absolute 0.0  0.0 - 0.7 (K/uL)    Basophils Absolute 0.0  0.0 - 0.1 (K/uL)    RBC Morphology TARGET CELLS      WBC Morphology MILD LEFT SHIFT (1-5% METAS, OCC MYELO, OCC BANDS)     COMPREHENSIVE METABOLIC PANEL     Status: Abnormal   Collection Time   10/06/11  6:30 AM  Component Value Range Comment   Sodium 132 (*) 135 - 145 (mEq/L)    Potassium 3.2 (*) 3.5 - 5.1 (mEq/L)    Chloride 90 (*) 96 - 112 (mEq/L)    CO2 25  19 - 32 (mEq/L)    Glucose, Bld 116 (*) 70 - 99 (mg/dL)    BUN 75 (*) 6 - 23 (mg/dL)    Creatinine, Ser 4.09 (*) 0.50 - 1.10 (mg/dL)    Calcium 9.0  8.4 - 10.5 (mg/dL)    Total Protein 5.3 (*) 6.0 - 8.3 (g/dL)    Albumin 1.3 (*) 3.5 - 5.2 (g/dL)    AST 43 (*) 0 - 37 (U/L)    ALT 56 (*) 0 - 35 (U/L)    Alkaline Phosphatase 369 (*) 39 - 117 (U/L)    Total Bilirubin 0.4  0.3 - 1.2 (mg/dL)    GFR calc non Af Amer 6 (*) >90 (mL/min)    GFR calc Af Amer 7 (*) >90 (mL/min)   GLUCOSE, CAPILLARY     Status: Abnormal   Collection Time   10/06/11  7:55 AM      Component Value Range Comment   Glucose-Capillary 130 (*) 70 - 99 (mg/dL)    Comment 1 Notify RN      Comment 2 Documented in Chart     GLUCOSE, CAPILLARY     Status: Abnormal   Collection Time   10/06/11 11:37 AM      Component Value Range Comment   Glucose-Capillary 210 (*) 70 - 99  (mg/dL)   GLUCOSE, CAPILLARY     Status: Abnormal   Collection Time   10/06/11  4:57 PM      Component Value Range Comment   Glucose-Capillary 135 (*) 70 - 99 (mg/dL)   GLUCOSE, CAPILLARY     Status: Abnormal   Collection Time   10/06/11  8:24 PM      Component Value Range Comment   Glucose-Capillary 220 (*) 70 - 99 (mg/dL)    Comment 1 Notify RN     VANCOMYCIN, RANDOM     Status: Normal   Collection Time   10/07/11  6:00 AM      Component Value Range Comment   Vancomycin Rm 20.4     COMPREHENSIVE METABOLIC PANEL     Status: Abnormal   Collection Time   10/07/11  6:00 AM      Component Value Range Comment   Sodium 130 (*) 135 - 145 (mEq/L)    Potassium 3.6  3.5 - 5.1 (mEq/L)    Chloride 90 (*) 96 - 112 (mEq/L)    CO2 24  19 - 32 (mEq/L)    Glucose, Bld 193 (*) 70 - 99 (mg/dL)    BUN 67 (*) 6 - 23 (mg/dL)    Creatinine, Ser 8.11 (*) 0.50 - 1.10 (mg/dL)    Calcium 9.4  8.4 - 10.5 (mg/dL)    Total Protein 5.6 (*) 6.0 - 8.3 (g/dL)    Albumin 1.2 (*) 3.5 - 5.2 (g/dL)    AST 18  0 - 37 (U/L)    ALT 35  0 - 35 (U/L)    Alkaline Phosphatase 385 (*) 39 - 117 (U/L)    Total Bilirubin 0.4  0.3 - 1.2 (mg/dL)    GFR calc non Af Amer 8 (*) >90 (mL/min)    GFR calc Af Amer 9 (*) >90 (mL/min)   CBC     Status: Abnormal   Collection Time   10/07/11  6:00 AM      Component Value Range Comment   WBC 18.1 (*) 4.0 - 10.5 (K/uL)    RBC 2.88 (*) 3.87 - 5.11 (MIL/uL)    Hemoglobin 8.1 (*) 12.0 - 15.0 (g/dL)    HCT 40.9 (*) 81.1 - 46.0 (%)    MCV 86.1  78.0 - 100.0 (fL)    MCH 28.1  26.0 - 34.0 (pg)    MCHC 32.7  30.0 - 36.0 (g/dL)    RDW 91.4 (*) 78.2 - 15.5 (%)    Platelets 58 (*) 150 - 400 (K/uL) PLATELET COUNT CONFIRMED BY SMEAR  DIFFERENTIAL     Status: Abnormal   Collection Time   10/07/11  6:00 AM      Component Value Range Comment   Neutrophils Relative 91 (*) 43 - 77 (%)    Lymphocytes Relative 3 (*) 12 - 46 (%)    Monocytes Relative 6  3 - 12 (%)    Eosinophils Relative 0  0 - 5  (%)    Basophils Relative 0  0 - 1 (%)    Neutro Abs 16.5 (*) 1.7 - 7.7 (K/uL)    Lymphs Abs 0.5 (*) 0.7 - 4.0 (K/uL)    Monocytes Absolute 1.1 (*) 0.1 - 1.0 (K/uL)    Eosinophils Absolute 0.0  0.0 - 0.7 (K/uL)    Basophils Absolute 0.0  0.0 - 0.1 (K/uL)    RBC Morphology TARGET CELLS      WBC Morphology INCREASED BANDS (>20% BANDS)   TOXIC GRANULATION  GLUCOSE, CAPILLARY     Status: Abnormal   Collection Time   10/07/11  7:46 AM      Component Value Range Comment   Glucose-Capillary 161 (*) 70 - 99 (mg/dL)    Comment 1 Notify RN      Comment 2 Documented in Chart     GLUCOSE, CAPILLARY     Status: Abnormal   Collection Time   10/07/11  9:47 AM      Component Value Range Comment   Glucose-Capillary 194 (*) 70 - 99 (mg/dL)   GLUCOSE, CAPILLARY     Status: Abnormal   Collection Time   10/07/11 11:38 AM      Component Value Range Comment   Glucose-Capillary 234 (*) 70 - 99 (mg/dL)     Dg Chest 1 View  9/56/2130  *RADIOLOGY REPORT*  Clinical Data: Follow-up congestive heart failure. Altered mental status.  Coronary artery disease.  CHEST - 1 VIEW  Comparison: 10/05/2011  Findings: Cardiomegaly stable.  Diffuse bilateral pulmonary air space disease shows no significant interval change.  AICD remains in appropriate position.  Prior CABG again noted.  IMPRESSION: Stable cardiomegaly and bilateral diffuse pulmonary air space disease/edema.  Original Report Authenticated By: Danae Orleans, M.D.   Ct Head Wo Contrast  10/05/2011  *RADIOLOGY REPORT*  Clinical Data: Fall, confusion  CT HEAD WITHOUT CONTRAST  Technique:  Contiguous axial images were obtained from the base of the skull through the vertex without contrast.  Comparison: Brain MRI 08/06/2003, head CT 07/02/2003  Findings: No acute hemorrhage, acute infarction, or mass lesion is identified.  No midline shift.  No ventriculomegaly.  No skull fracture.  CSF density in the region of the sella turcica again incidentally noted.  Evidence of  chronic right maxillary sinusitis with osseous sclerosis but no air-fluid level or focal calcification.  IMPRESSION: No acute intracranial finding.  Original Report Authenticated By: Harrel Lemon, M.D.   Dg Chest Port 1  View  10/05/2011  *RADIOLOGY REPORT*  Clinical Data: Congestive heart failure.  Chronic renal failure on dialysis.  Altered mental status.  Follow-up pulmonary edema.  PORTABLE CHEST - 1 VIEW  Comparison: 06/02/2011  Findings: There has been interval placement of a triple lead AICD, which is seen in appropriate position.  No evidence of pneumothorax or pleural effusion.  Moderate cardiomegaly stable.  Diffuse bilateral pulmonary air space disease shows no significant change, consistent with diffuse pulmonary edema.  Decreased pulmonary consolidation is seen in the inferior aspect of the right upper lobe since prior exam.  IMPRESSION:  1.  No significant change in cardiomegaly and diffuse pulmonary edema, consistent with congestive heart failure. 2.  Decreased air space disease in the inferior right upper lobe. 3.  New AICD in appropriate position.  No evidence of pneumothorax.  Original Report Authenticated By: Danae Orleans, M.D.     Assessment/Plan:   61 YO female with multiple medical problems who comes to hospital with hyponatremia, hypokalemia, elevated creatinine, waxing and waning oxygen saturations while in hospital, MRSA positive blood cultures, initial WBC 21.7 now 18.1 and confusion .  CT head normal and MRI not able to obtain due to AICD.  Likely encephalopathy due to multiple metabolic issues.   EEG is not able to be performed at this time due to hair weave. If patient does not improve will consider EEG at that time.  If EEG necessary family will need to come in and remove hair.  Recommend:  1) Continue to treat underlying medical/metabolic abnormalities 2) Keep narcotics and sedating medications to a minimum.    Felicie Morn PA-C [facility] [phone]  10/07/2011, 2:05 PM    Patient seen and examined. I agree with the above.  Thana Farr, MD [facility] [phone]  10/07/2011  4:18 PM

## 2011-10-07 NOTE — Progress Notes (Signed)
*  PRELIMINARY RESULTS* Echocardiogram 2D Echocardiogram has been performed.  [name] 10/07/2011, 2:47 PM

## 2011-10-07 NOTE — Progress Notes (Signed)
[facility] ROUNDING NOTE   Subjective:   Interval History: none.  Objective:  Vital signs in last 24 hours:  Temp:  [97.4 F (36.3 C)-99 F (37.2 C)] 99 F (37.2 C) (04/24 0523) Pulse Rate:  [80-165] 116  (04/24 0820) Resp:  [18-24] 24  (04/24 0820) BP: (82-145)/(53-90) 145/90 mmHg (04/24 0820) SpO2:  [85 %-99 %] 99 % (04/24 0820) Weight:  [66.9 kg (147 lb 7.8 oz)] 66.9 kg (147 lb 7.8 oz) (04/23 2145)  Weight change: 1.9 kg (4 lb 3 oz) Filed Weights   10/05/11 0748 10/05/11 1936 10/06/11 2145  Weight: 65 kg (143 lb 4.8 oz) 65 kg (143 lb 4.8 oz) 66.9 kg (147 lb 7.8 oz)    Intake/Output: I/O last 3 completed shifts: In: 1235.8 [P.O.:480; I.V.:545.8; IV Piggyback:210] Out: 0    Intake/Output this shift:     General: Ill appearing, unkempt no change from yesterday  Head: puffy face  Neck: Supple.  Lungs: clear  Heart: RRR with S1 S2. No murmurs, rubs, or gallops appreciated.  Abdomen: Soft, not significantly-tender, non-distended with normoactive bowel sounds. No rebound/guarding. No obvious abdominal masses.  Lower extremities: 1 + LE  Neuro: awake and alert    Basic Metabolic Panel:  Lab 10/07/11 1610 10/06/11 0630 10/05/11 0700 10/04/11 0515 10/03/11 1852 10/03/11 1851  NA 130* 132* 129* 132* -- 128*  K 3.6 3.2* 4.0 5.1 5.3* --  CL 90* 90* 88* 89* -- 85*  CO2 24 25 23 24  -- 25  GLUCOSE 193* 116* 151* 101* -- 115*  BUN 67* 75* 86* 89* -- 86*  CREATININE 5.43* 6.24* 7.46* 8.76* -- 8.74*  CALCIUM 9.4 9.0 9.0 -- -- --  MG -- -- -- -- -- --  PHOS -- -- 6.1* -- -- --    Liver Function Tests:  Lab 10/07/11 0600 10/06/11 0630 10/05/11 0700 10/03/11 1851  AST 18 43* -- 19  ALT 35 56* -- 18  ALKPHOS 385* 369* -- 282*  BILITOT 0.4 0.4 -- 0.3  PROT 5.6* 5.3* -- 6.3  ALBUMIN 1.2* 1.3* 1.4* 2.0*    Lab 10/05/11 0700 10/03/11 1851  LIPASE 10* 10*  AMYLASE -- --    Lab 10/06/11 0630 10/05/11 1854  AMMONIA 53 54    CBC:  Lab 10/07/11 0600  10/06/11 0630 10/05/11 0700 10/04/11 0515 10/04/11 0056 10/03/11 1851  WBC 18.1* 13.1* 17.2* 21.0* 21.7* --  NEUTROABS 16.5* 11.9* -- -- 20.3* 22.3*  HGB 8.1* 7.6* 7.5* 8.2* 8.7* --  HCT 24.8* 23.3* 22.1* 24.8* 26.3* --  MCV 86.1 86.6 84.7 85.5 88.0 --  PLT 58* 58* 83* 115* 134* --    Cardiac Enzymes:  Lab 10/05/11 1843  CKTOTAL 10  CKMB 1.5  CKMBINDEX --  TROPONINI <0.30    BNP: No components found with this basename: POCBNP:5  CBG:  Lab 10/07/11 0746 10/06/11 2024 10/06/11 1657 10/06/11 1137 10/06/11 0755  GLUCAP 161* 220* 135* 210* 130*    Microbiology: Results for orders placed during the hospital encounter of 10/03/11  CULTURE, BLOOD (ROUTINE X 2)     Status: Normal   Collection Time   10/04/11  2:15 AM      Component Value Range Status Comment   Specimen Description BLOOD RIGHT FINGER   Final    Special Requests BOTTLES DRAWN AEROBIC ONLY [facility]   Final    Culture  Setup Time [id number]   Final    Culture     Final    Value:  METHICILLIN RESISTANT STAPHYLOCOCCUS AUREUS     Note: RIFAMPIN AND GENTAMICIN SHOULD NOT BE USED AS SINGLE DRUGS FOR TREATMENT OF STAPH INFECTIONS. This organism DOES NOT demonstrate inducible Clindamycin resistance in vitro. CRITICAL RESULT CALLED TO, READ BACK BY AND VERIFIED WITH: JENAE M @ 1430      10/06/11 BY KRAWS     Note: Gram Stain Report Called to,Read Back By and Verified With: Elsie Lincoln RN on 10/05/11 at 00:35 by Christie Nottingham   Report Status 10/07/2011 FINAL   Final    Organism ID, Bacteria METHICILLIN RESISTANT STAPHYLOCOCCUS AUREUS   Final   CULTURE, BLOOD (ROUTINE X 2)     Status: Normal   Collection Time   10/04/11  2:30 AM      Component Value Range Status Comment   Specimen Description BLOOD RIGHT HAND   Final    Special Requests BOTTLES DRAWN AEROBIC ONLY 5CC   Final    Culture  Setup Time [id number]   Final    Culture     Final    Value: STAPHYLOCOCCUS AUREUS     Note: SUSCEPTIBILITIES PERFORMED ON PREVIOUS CULTURE  WITHIN THE LAST 5 DAYS.     Note: Gram Stain Report Called to,Read Back By and Verified With: Elsie Lincoln RN on 10/05/11 at 00:35 by Christie Nottingham   Report Status 10/07/2011 FINAL   Final   BODY FLUID CULTURE     Status: Normal (Preliminary result)   Collection Time   10/04/11  6:45 AM      Component Value Range Status Comment   Specimen Description PERITONEAL FLUID   Final    Special Requests 1 CUP   Final    Gram Stain     Final    Value: WBC PRESENT, PREDOMINANTLY PMN     NO ORGANISMS SEEN   Culture NO GROWTH 2 DAYS   Final    Report Status PENDING   Incomplete     Coagulation Studies: No results found for this basename: LABPROT:5,INR:5 in the last 72 hours  Urinalysis: No results found for this basename: COLORURINE:2,APPERANCEUR:2,LABSPEC:2,PHURINE:2,GLUCOSEU:2,HGBUR:2,BILIRUBINUR:2,KETONESUR:2,PROTEINUR:2,UROBILINOGEN:2,NITRITE:2,LEUKOCYTESUR:2 in the last 72 hours    Imaging: Dg Chest 1 View  10/06/2011  *RADIOLOGY REPORT*  Clinical Data: Follow-up congestive heart failure. Altered mental status.  Coronary artery disease.  CHEST - 1 VIEW  Comparison: 10/05/2011  Findings: Cardiomegaly stable.  Diffuse bilateral pulmonary air space disease shows no significant interval change.  AICD remains in appropriate position.  Prior CABG again noted.  IMPRESSION: Stable cardiomegaly and bilateral diffuse pulmonary air space disease/edema.  Original Report Authenticated By: Danae Orleans, M.D.   Ct Head Wo Contrast  10/05/2011  *RADIOLOGY REPORT*  Clinical Data: Fall, confusion  CT HEAD WITHOUT CONTRAST  Technique:  Contiguous axial images were obtained from the base of the skull through the vertex without contrast.  Comparison: Brain MRI 08/06/2003, head CT 07/02/2003  Findings: No acute hemorrhage, acute infarction, or mass lesion is identified.  No midline shift.  No ventriculomegaly.  No skull fracture.  CSF density in the region of the sella turcica again incidentally noted.  Evidence of  chronic right maxillary sinusitis with osseous sclerosis but no air-fluid level or focal calcification.  IMPRESSION: No acute intracranial finding.  Original Report Authenticated By: Harrel Lemon, M.D.   Dg Chest Port 1 View  10/05/2011  *RADIOLOGY REPORT*  Clinical Data: Congestive heart failure.  Chronic renal failure on dialysis.  Altered mental status.  Follow-up pulmonary edema.  PORTABLE CHEST - 1 VIEW  Comparison: 06/02/2011  Findings: There has been interval placement of a triple lead AICD, which is seen in appropriate position.  No evidence of pneumothorax or pleural effusion.  Moderate cardiomegaly stable.  Diffuse bilateral pulmonary air space disease shows no significant change, consistent with diffuse pulmonary edema.  Decreased pulmonary consolidation is seen in the inferior aspect of the right upper lobe since prior exam.  IMPRESSION:  1.  No significant change in cardiomegaly and diffuse pulmonary edema, consistent with congestive heart failure. 2.  Decreased air space disease in the inferior right upper lobe. 3.  New AICD in appropriate position.  No evidence of pneumothorax.  Original Report Authenticated By: Danae Orleans, M.D.     Medications:      . DISCONTD: sodium chloride 50 mL/hr at 10/06/11 0215      . sodium chloride   Intravenous Once  . ampicillin-sulbactam (UNASYN) IV  3 g Intravenous QHS  . carvedilol  6.25 mg Oral BID WC  . darbepoetin (ARANESP) injection - NON-DIALYSIS  40 mcg Subcutaneous Q Sun-1800  . doxazosin  2 mg Oral QHS  . feeding supplement  1 Container Oral TID BM  . fentaNYL  25 mcg Transdermal Q72H  . furosemide  40 mg Intravenous Once  . insulin aspart  0-9 Units Subcutaneous TID WC  . insulin glargine  5 Units Subcutaneous Daily  . multivitamin  1 tablet Oral QHS  . pantoprazole (PROTONIX) IV  40 mg Intravenous Q24H  . potassium chloride  40 mEq Oral BID  . ranolazine  500 mg Oral Daily  . sevelamer  3,200 mg Oral TID WC  . simvastatin   10 mg Oral q1800  . sodium chloride  3 mL Intravenous Q12H  . DISCONTD: insulin aspart  0-9 Units Subcutaneous Q4H  . DISCONTD: potassium chloride  40 mEq Oral Once   acetaminophen, acetaminophen, heparin, HYDROmorphone, hydrOXYzine, ondansetron (ZOFRAN) IV, ondansetron, zolpidem  Assessment/ Plan:  ESRD- CAPD 1.5% bags blood pressure a little low  HTN controlled  Anemia stable  Abdominal pain PD may be aggravating. Quite low albumin. I would be in favor of stopping PD and trying hemodialysis.  MRSA bacteremia.  LOS: 4 [name] W @TODAY @8 :41 AM

## 2011-10-07 NOTE — Progress Notes (Signed)
Patient's BP is 82/53 HR 167.Patient c/o abdominal pain.K. Kirby,NP notified. Merion Caton Joselita,RN

## 2011-10-08 DIAGNOSIS — K5649 Other impaction of intestine: Secondary | ICD-10-CM

## 2011-10-08 DIAGNOSIS — R7881 Bacteremia: Secondary | ICD-10-CM

## 2011-10-08 LAB — CBC
HCT: 24.6 % — ABNORMAL LOW (ref 36.0–46.0)
MCHC: 32.1 g/dL (ref 30.0–36.0)
MCV: 86.6 fL (ref 78.0–100.0)
Platelets: 59 10*3/uL — ABNORMAL LOW (ref 150–400)
RDW: 19.4 % — ABNORMAL HIGH (ref 11.5–15.5)
WBC: 18.3 10*3/uL — ABNORMAL HIGH (ref 4.0–10.5)

## 2011-10-08 LAB — COMPREHENSIVE METABOLIC PANEL
AST: 18 U/L (ref 0–37)
Albumin: 1.2 g/dL — ABNORMAL LOW (ref 3.5–5.2)
BUN: 64 mg/dL — ABNORMAL HIGH (ref 6–23)
Creatinine, Ser: 4.99 mg/dL — ABNORMAL HIGH (ref 0.50–1.10)
Potassium: 4.9 mEq/L (ref 3.5–5.1)
Total Protein: 5.7 g/dL — ABNORMAL LOW (ref 6.0–8.3)

## 2011-10-08 LAB — GLUCOSE, CAPILLARY
Glucose-Capillary: 130 mg/dL — ABNORMAL HIGH (ref 70–99)
Glucose-Capillary: 139 mg/dL — ABNORMAL HIGH (ref 70–99)
Glucose-Capillary: 206 mg/dL — ABNORMAL HIGH (ref 70–99)

## 2011-10-08 LAB — AMYLASE: Amylase: 28 U/L (ref 0–105)

## 2011-10-08 LAB — LIPASE, BLOOD: Lipase: 15 U/L (ref 11–59)

## 2011-10-08 MED ORDER — WHITE PETROLATUM GEL
Status: AC
  Administered 2011-10-08: 14:00:00
  Filled 2011-10-08: qty 5

## 2011-10-08 MED ORDER — SEVELAMER CARBONATE 800 MG PO TABS
3200.0000 mg | ORAL_TABLET | Freq: Three times a day (TID) | ORAL | Status: DC
  Administered 2011-10-08 – 2011-10-10 (×6): 3200 mg via ORAL
  Filled 2011-10-08 (×21): qty 4

## 2011-10-08 MED ORDER — IOHEXOL 300 MG/ML  SOLN
20.0000 mL | INTRAMUSCULAR | Status: AC
  Administered 2011-10-08 (×2): 20 mL via ORAL

## 2011-10-08 MED ORDER — CEFTAZIDIME 1 G IJ SOLR
1.0000 g | INTRAMUSCULAR | Status: DC
  Administered 2011-10-08: 1 g via INTRAVENOUS
  Filled 2011-10-08 (×2): qty 1

## 2011-10-08 NOTE — Progress Notes (Signed)
Appreciate renal/neuro/ID SUBJECTIVE Says she is ok. No complaints. Per nursing report has a bulge in left perianal area, noticed this afternoon.   1. Pancreatic pseudocyst   2. Epigastric abdominal pain   3. End stage renal disease     Past Medical History  Diagnosis Date  . Gallstone pancreatitis   . Coronary artery disease     s/p CABG 2008 with multiple PCIs  . Ischemic cardiomyopathy   . End stage renal disease   . Diabetes mellitus   . Hypertension   . History of nonadherence to medical treatment   . LBBB (left bundle branch block)   . Hyperlipidemia   . Renal insufficiency   . CHF (congestive heart failure)   . Dialysis patient   . Anemia    Current Facility-Administered Medications  Medication Dose Route Frequency Provider Last Rate Last Dose  . acetaminophen (TYLENOL) tablet 650 mg  650 mg Oral Q6H PRN Ron Parker, MD   650 mg at 10/08/11 1001   Or  . acetaminophen (TYLENOL) suppository 650 mg  650 mg Rectal Q6H PRN Ron Parker, MD      . carvedilol (COREG) tablet 6.25 mg  6.25 mg Oral BID WC Ron Parker, MD   6.25 mg at 10/08/11 1700  . cefTAZidime (FORTAZ) 1 g in dextrose 5 % 50 mL IVPB  1 g Intravenous Q24H Devann Cribb, MD   1 g at 10/08/11 1334  . darbepoetin (ARANESP) injection 40 mcg  40 mcg Subcutaneous Q Sun-1800 Sheffield Slider, PA   40 mcg at 10/04/11 1704  . doxazosin (CARDURA) tablet 2 mg  2 mg Oral QHS Ron Parker, MD   2 mg at 10/07/11 2105  . feeding supplement (RESOURCE BREEZE) liquid 1 Container  1 Container Oral TID BM Sheffield Slider, PA   1 Container at 10/08/11 1331  . heparin injection 500 Units  500 Units Intraperitoneal PRN Zada Girt, MD   500 Units at 10/08/11 [phone]  . HYDROmorphone (DILAUDID) injection 2 mg  2 mg Intravenous Q3H PRN Ron Parker, MD   2 mg at 10/08/11 1700  . hydrOXYzine (ATARAX/VISTARIL) tablet 10 mg  10 mg Oral Q12H PRN Clanford L Johnson, MD      . insulin aspart (novoLOG)  injection 0-9 Units  0-9 Units Subcutaneous TID WC Clanford Cyndie Mull, MD   2 Units at 10/08/11 1709  . insulin glargine (LANTUS) injection 5 Units  5 Units Subcutaneous Daily Clanford Cyndie Mull, MD   5 Units at 10/08/11 1000  . multivitamin (RENA-VIT) tablet 1 tablet  1 tablet Oral QHS Rhetta Mura, MD   1 tablet at 10/07/11 2105  . ondansetron (ZOFRAN) tablet 4 mg  4 mg Oral Q6H PRN Ron Parker, MD       Or  . ondansetron (ZOFRAN) injection 4 mg  4 mg Intravenous Q6H PRN Ron Parker, MD   4 mg at 10/04/11 0415  . pantoprazole (PROTONIX) injection 40 mg  40 mg Intravenous Q24H Ron Parker, MD   40 mg at 10/08/11 0116  . potassium chloride SA (K-DUR,KLOR-CON) CR tablet 40 mEq  40 mEq Oral BID Clanford Cyndie Mull, MD   40 mEq at 10/07/11 2104  . ranolazine (RANEXA) 12 hr tablet 500 mg  500 mg Oral Daily Ron Parker, MD   500 mg at 10/08/11 0955  . sevelamer (RENVELA) tablet 3,200 mg  3,200 mg Oral TID WC Conley Canal, MD  3,200 mg at 10/08/11 1700  . simvastatin (ZOCOR) tablet 10 mg  10 mg Oral q1800 Ron Parker, MD   10 mg at 10/08/11 1700  . sodium chloride 0.9 % injection 3 mL  3 mL Intravenous Q12H Ron Parker, MD   3 mL at 10/08/11 1002  . white petrolatum (VASELINE) gel           . zolpidem (AMBIEN) tablet 5 mg  5 mg Oral QHS PRN Ron Parker, MD      . DISCONTD: piperacillin-tazobactam (ZOSYN) IVPB 2.25 g  2.25 g Intravenous Q8H Siler Mavis, MD   2.25 g at 10/08/11 0625  . DISCONTD: sevelamer (RENAGEL) tablet 3,200 mg  3,200 mg Oral TID WC Ron Parker, MD   3,200 mg at 10/07/11 0914   Allergies  Allergen Reactions  . Morphine And Related Anaphylaxis  . Codeine Itching  . Lisinopril     Language changed   Principal Problem:  *Epigastric abdominal pain Active Problems:  Ischemic cardiomyopathy  Chronic systolic congestive heart failure  End stage renal disease  Pancreatic pseudocyst  Pancreatitis chronic   Hyperkalemia  Hyponatremia   Vital signs in last 24 hours: Temp:  [97.7 F (36.5 C)-98.6 F (37 C)] 97.7 F (36.5 C) (04/25 0933) Pulse Rate:  [77-99] 77  (04/25 0933) Resp:  [20-22] 20  (04/25 0933) BP: (90-104)/(64-73) 90/71 mmHg (04/25 0933) SpO2:  [94 %-99 %] 95 % (04/25 0933) Weight:  [66.8 kg (147 lb 4.3 oz)] 66.8 kg (147 lb 4.3 oz) (04/24 2000) Weight change: -0.1 kg (-3.5 oz) Last BM Date: 10/07/11  Intake/Output from previous day: 04/24 0701 - 04/25 0700 In: 1270 [P.O.:720; IV Piggyback:550] Out: 1 [Stool:1] Intake/Output this shift:    Lab Results:  Basename 10/08/11 0500 10/07/11 0600  WBC 18.3* 18.1*  HGB 7.9* 8.1*  HCT 24.6* 24.8*  PLT 59* 58*   BMET  Basename 10/08/11 0500 10/07/11 0600  NA 132* 130*  K 4.9 3.6  CL 93* 90*  CO2 22 24  GLUCOSE 160* 193*  BUN 64* 67*  CREATININE 4.99* 5.43*  CALCIUM 9.5 9.4    Studies/Results: No results found.  Medications: I have reviewed the patient's current medications.   Physical exam GENERAL- alert HEAD- normal atraumatic, no neck masses, normal thyroid, no jvd RESPIRATORY- appears well, vitals normal, no respiratory distress, acyanotic, normal RR, ear and throat exam is normal, neck free of mass or lymphadenopathy, chest clear, no wheezing, crepitations, rhonchi, normal symmetric air entry CVS- regular rate and rhythm, S1, S2 normal, no murmur, click, rub or gallop ABDOMEN- PD catheter in place. Perianal swelling, tender to palpation more on left side. NEURO- More with it today. Responding to questions but still difficult to understand. EXTREMITIES- extremities normal, atraumatic, no cyanosis or edema  Plan  * MRSA bacteremia- ? Source. ?Perianal abscess. Appreciate ID. Spoke with Dr Lindie Spruce, who will graciously see patient later today. Will repeat CT abdomen/pelvis to evaluate for possible perianal abscess. * Ischemic cardiomyopathy/ Chronic systolic congestive heart failure(20%)- stable.  * End stage  renal disease-on PD. Appreciate renal.  * Pancreatic pseudocyst/ Pancreatitis chronic- tolerating feeding, refusing ngt. ?drain pseudocyst * Anemia of chronic kidney disease- seems stable.  * encephalopathy- appreciate neuro.  Dvt/gi prophylaxis.     Keiondra Brookover 10/08/2011 5:27 PM Pager: [phone].

## 2011-10-08 NOTE — Progress Notes (Signed)
Patient's BP 74/52 HR 83,verified using doppler.Patient lethargic but easily arousable and answers simple questions.MD on call,K. Craige Cotta notified.Order to recheck BP in 30 minutes and notify MD.Will continue to monitor. Denson Niccoli Joselita,RN

## 2011-10-08 NOTE — Progress Notes (Signed)
Patient's recheck BP 84/52 HR 76.Patient still lethargic but easily arouses.MD on call Donnamarie Poag notified of VS and mentation.Order to recheck BP in one hour and notify MD. Clydell Hakim

## 2011-10-08 NOTE — Progress Notes (Signed)
Inpatient Diabetes Program Recommendations  AACE/ADA: New Consensus Statement on Inpatient Glycemic Control (2009)  Target Ranges:  Prepandial:   less than 140 mg/dL      Peak postprandial:   less than 180 mg/dL (1-2 hours)      Critically ill patients:  140 - 180 mg/dL     Results for [name], [name] (MRN [id number]) as of 10/08/2011 12:56  Ref. Range 10/07/2011 07:46 10/07/2011 09:47 10/07/2011 11:38 10/07/2011 16:45 10/07/2011 19:59  Glucose-Capillary Latest Range: 70-99 mg/dL 409 (H) 811 (H) 914 (H) 197 (H) 202 (H)    Results for [name], [name] (MRN [id number]) as of 10/08/2011 12:56  Ref. Range 10/08/2011 08:52 10/08/2011 12:04  Glucose-Capillary Latest Range: 70-99 mg/dL 086 (H) 578 (H)   Inpatient Diabetes Program Recommendations Insulin - Basal: Please increase Lantus to 10 units daily (home dose is Lantus 10 units daily)  Note: will follow. Ambrose Finland RN, MSN, CDE Diabetes Coordinator Inpatient Diabetes Program [phone]

## 2011-10-08 NOTE — Consult Note (Addendum)
Date of Admission:  10/03/2011  Date of Consult:  10/08/2011  Reason for Consult:MRSA bacteremia in HD pt Referring Physician:  Dr. Venetia Constable   HPI: [name] is an 61 y.o. female.ischemic Cardiomyopathy and ESRD on peritoneal dialysis who presented to the ED with complaints of worsening epigastric ABD Pain. She had  CT scan withuot contrast was found to have a pancreatic pseudocyst which is larger than on recent previous CT scans. She was started on unasyn on the 20th. She had peritoneal fluid sampled on the 21st and it was relatively unremarkable and with negative cultures. Blood cultures from 21st are growing MRSA and she is on vancomycin and zosyn. She is obtunded and cannot provide much in a way of a history at all. She seems to indicate that she may have had HD in the past. Notes from nephrology indicate that she had told family she did not want to have HD in future. Other than her abdominal pain and can find no localizing symptoms. Her CXR does show diffuse opacities thought to be pulmonary edema.   Past Medical History  Diagnosis Date  . Gallstone pancreatitis   . Coronary artery disease     s/p CABG 2008 with multiple PCIs  . Ischemic cardiomyopathy   . End stage renal disease   . Diabetes mellitus   . Hypertension   . History of nonadherence to medical treatment   . LBBB (left bundle branch block)   . Hyperlipidemia   . Renal insufficiency   . CHF (congestive heart failure)   . Dialysis patient   . Anemia     Past Surgical History  Procedure Date  . Coronary artery bypass graft 2008  . Cholecystectomy   . Abdominal hysterectomy   . Tubal ligation   . Tonsillectomy   . Insert / replace / remove pacemaker     pacemaker ICD  . Esophagogastroduodenoscopy 09/15/2011    Procedure: ESOPHAGOGASTRODUODENOSCOPY (EGD);  Surgeon: Theda Belfast, MD;  Location: [facility] ENDOSCOPY;  Service: Endoscopy;  Laterality: N/A;  ergies:   Allergies  Allergen Reactions  . Morphine And Related  Anaphylaxis  . Codeine Itching  . Lisinopril     Language changed     Medications: I have reviewed patients current medications as documented in Epic Anti-infectives     Start     Dose/Rate Route Frequency Ordered Stop   10/07/11 1000  piperacillin-tazobactam (ZOSYN) IVPB 2.25 g       2.25 g 100 mL/hr over 30 Minutes Intravenous 3 times per day 10/07/11 0927     10/07/11 0930   vancomycin (VANCOCIN) 1,750 mg in sodium chloride 0.9 % 500 mL IVPB        1,750 mg 250 mL/hr over 120 Minutes Intravenous  Once 10/07/11 0927 10/07/11 1241   10/05/11 0300   vancomycin (VANCOCIN) 1,750 mg in sodium chloride 0.9 % 500 mL IVPB        1,750 mg 250 mL/hr over 120 Minutes Intravenous  Once 10/05/11 0204 10/05/11 0500   10/04/11 0300   Ampicillin-Sulbactam (UNASYN) 3 g in sodium chloride 0.9 % 100 mL IVPB  Status:  Discontinued     Comments: Unasyn 3 g IV q24h on Peritoneal Dialysis      3 g 100 mL/hr over 60 Minutes Intravenous Daily at bedtime 10/04/11 0151 10/07/11 1610          Social History:  reports that she quit smoking about 11 years ago. She does not have any smokeless tobacco history  on file. She reports that she does not drink alcohol or use illicit drugs.  Family History  Problem Relation Age of Onset  . Coronary artery disease Mother   . Hypertension Mother   . Diabetes Mother   . Diabetes Sister   . Anesthesia problems Neg Hx     As in HPI and primary teams notes otherwise 12 point review of systems is negative  Blood pressure 90/71, pulse 77, temperature 97.7 F (36.5 C), temperature source Oral, resp. rate 20, height 5\' 4"  (1.626 m), weight 147 lb 4.3 oz (66.8 kg), SpO2 95.00%.  General: sleep arousable, knows her name and that she is at [facility] but not able to provide much else other than ? Prior HD HEENT: anicteric sclera, pupils reactive to light and accommodation, EOMI, oropharynx clear and without exudate CVS: tachycardic , normal r,  no murmur rubs or  gallops Chest: clear to auscultation bilaterally, no wheezing, rales or rhonchi Abdomen: tender around PD site which is not obviously purulent, n nondistended, normal bowel sounds, Extremities 2+ edema Skin: no rashes Neuro: nonfocal, strength and sensation intact   Results for orders placed during the hospital encounter of 10/03/11 (from the past 48 hour(s))  GLUCOSE, CAPILLARY     Status: Abnormal   Collection Time   10/06/11 11:37 AM      Component Value Range Comment   Glucose-Capillary 210 (*) 70 - 99 (mg/dL)   GLUCOSE, CAPILLARY     Status: Abnormal   Collection Time   10/06/11  4:57 PM      Component Value Range Comment   Glucose-Capillary 135 (*) 70 - 99 (mg/dL)   GLUCOSE, CAPILLARY     Status: Abnormal   Collection Time   10/06/11  8:24 PM      Component Value Range Comment   Glucose-Capillary 220 (*) 70 - 99 (mg/dL)    Comment 1 Notify RN     VANCOMYCIN, RANDOM     Status: Normal   Collection Time   10/07/11  6:00 AM      Component Value Range Comment   Vancomycin Rm 20.4     COMPREHENSIVE METABOLIC PANEL     Status: Abnormal   Collection Time   10/07/11  6:00 AM      Component Value Range Comment   Sodium 130 (*) 135 - 145 (mEq/L)    Potassium 3.6  3.5 - 5.1 (mEq/L)    Chloride 90 (*) 96 - 112 (mEq/L)    CO2 24  19 - 32 (mEq/L)    Glucose, Bld 193 (*) 70 - 99 (mg/dL)    BUN 67 (*) 6 - 23 (mg/dL)    Creatinine, Ser 9.60 (*) 0.50 - 1.10 (mg/dL)    Calcium 9.4  8.4 - 10.5 (mg/dL)    Total Protein 5.6 (*) 6.0 - 8.3 (g/dL)    Albumin 1.2 (*) 3.5 - 5.2 (g/dL)    AST 18  0 - 37 (U/L)    ALT 35  0 - 35 (U/L)    Alkaline Phosphatase 385 (*) 39 - 117 (U/L)    Total Bilirubin 0.4  0.3 - 1.2 (mg/dL)    GFR calc non Af Amer 8 (*) >90 (mL/min)    GFR calc Af Amer 9 (*) >90 (mL/min)   CBC     Status: Abnormal   Collection Time   10/07/11  6:00 AM      Component Value Range Comment   WBC 18.1 (*) 4.0 - 10.5 (K/uL)  RBC 2.88 (*) 3.87 - 5.11 (MIL/uL)    Hemoglobin 8.1 (*)  12.0 - 15.0 (g/dL)    HCT 45.4 (*) 09.8 - 46.0 (%)    MCV 86.1  78.0 - 100.0 (fL)    MCH 28.1  26.0 - 34.0 (pg)    MCHC 32.7  30.0 - 36.0 (g/dL)    RDW 11.9 (*) 14.7 - 15.5 (%)    Platelets 58 (*) 150 - 400 (K/uL) PLATELET COUNT CONFIRMED BY SMEAR  DIFFERENTIAL     Status: Abnormal   Collection Time   10/07/11  6:00 AM      Component Value Range Comment   Neutrophils Relative 91 (*) 43 - 77 (%)    Lymphocytes Relative 3 (*) 12 - 46 (%)    Monocytes Relative 6  3 - 12 (%)    Eosinophils Relative 0  0 - 5 (%)    Basophils Relative 0  0 - 1 (%)    Neutro Abs 16.5 (*) 1.7 - 7.7 (K/uL)    Lymphs Abs 0.5 (*) 0.7 - 4.0 (K/uL)    Monocytes Absolute 1.1 (*) 0.1 - 1.0 (K/uL)    Eosinophils Absolute 0.0  0.0 - 0.7 (K/uL)    Basophils Absolute 0.0  0.0 - 0.1 (K/uL)    RBC Morphology TARGET CELLS      WBC Morphology INCREASED BANDS (>20% BANDS)   TOXIC GRANULATION  GLUCOSE, CAPILLARY     Status: Abnormal   Collection Time   10/07/11  7:46 AM      Component Value Range Comment   Glucose-Capillary 161 (*) 70 - 99 (mg/dL)    Comment 1 Notify RN      Comment 2 Documented in Chart     GLUCOSE, CAPILLARY     Status: Abnormal   Collection Time   10/07/11  9:47 AM      Component Value Range Comment   Glucose-Capillary 194 (*) 70 - 99 (mg/dL)   GLUCOSE, CAPILLARY     Status: Abnormal   Collection Time   10/07/11 11:38 AM      Component Value Range Comment   Glucose-Capillary 234 (*) 70 - 99 (mg/dL)   GLUCOSE, CAPILLARY     Status: Abnormal   Collection Time   10/07/11  4:45 PM      Component Value Range Comment   Glucose-Capillary 197 (*) 70 - 99 (mg/dL)   GLUCOSE, CAPILLARY     Status: Abnormal   Collection Time   10/07/11  7:59 PM      Component Value Range Comment   Glucose-Capillary 202 (*) 70 - 99 (mg/dL)   GLUCOSE, CAPILLARY     Status: Abnormal   Collection Time   10/08/11 12:21 AM      Component Value Range Comment   Glucose-Capillary 139 (*) 70 - 99 (mg/dL)   GLUCOSE, CAPILLARY      Status: Abnormal   Collection Time   10/08/11  4:23 AM      Component Value Range Comment   Glucose-Capillary 130 (*) 70 - 99 (mg/dL)   CBC     Status: Abnormal   Collection Time   10/08/11  5:00 AM      Component Value Range Comment   WBC 18.3 (*) 4.0 - 10.5 (K/uL)    RBC 2.84 (*) 3.87 - 5.11 (MIL/uL)    Hemoglobin 7.9 (*) 12.0 - 15.0 (g/dL)    HCT 82.9 (*) 56.2 - 46.0 (%)    MCV 86.6  78.0 - 100.0 (  fL)    MCH 27.8  26.0 - 34.0 (pg)    MCHC 32.1  30.0 - 36.0 (g/dL)    RDW 96.0 (*) 45.4 - 15.5 (%)    Platelets 59 (*) 150 - 400 (K/uL)   COMPREHENSIVE METABOLIC PANEL     Status: Abnormal   Collection Time   10/08/11  5:00 AM      Component Value Range Comment   Sodium 132 (*) 135 - 145 (mEq/L)    Potassium 4.9  3.5 - 5.1 (mEq/L)    Chloride 93 (*) 96 - 112 (mEq/L)    CO2 22  19 - 32 (mEq/L)    Glucose, Bld 160 (*) 70 - 99 (mg/dL)    BUN 64 (*) 6 - 23 (mg/dL)    Creatinine, Ser 0.98 (*) 0.50 - 1.10 (mg/dL)    Calcium 9.5  8.4 - 10.5 (mg/dL)    Total Protein 5.7 (*) 6.0 - 8.3 (g/dL)    Albumin 1.2 (*) 3.5 - 5.2 (g/dL)    AST 18  0 - 37 (U/L)    ALT 24  0 - 35 (U/L)    Alkaline Phosphatase 317 (*) 39 - 117 (U/L)    Total Bilirubin 0.5  0.3 - 1.2 (mg/dL)    GFR calc non Af Amer 8 (*) >90 (mL/min)    GFR calc Af Amer 10 (*) >90 (mL/min)   LIPASE, BLOOD     Status: Normal   Collection Time   10/08/11  5:00 AM      Component Value Range Comment   Lipase 15  11 - 59 (U/L)   AMYLASE     Status: Normal   Collection Time   10/08/11  5:00 AM      Component Value Range Comment   Amylase 28  0 - 105 (U/L)   GLUCOSE, CAPILLARY     Status: Abnormal   Collection Time   10/08/11  8:52 AM      Component Value Range Comment   Glucose-Capillary 206 (*) 70 - 99 (mg/dL)       Component Value Date/Time   SDES PERITONEAL FLUID 10/04/2011 0645   SPECREQUEST 1 CUP 10/04/2011 0645   CULT NO GROWTH 3 DAYS 10/04/2011 0645   REPTSTATUS 10/07/2011 FINAL 10/04/2011 0645   Dg Chest 1 View  10/06/2011   *RADIOLOGY REPORT*  Clinical Data: Follow-up congestive heart failure. Altered mental status.  Coronary artery disease.  CHEST - 1 VIEW  Comparison: 10/05/2011  Findings: Cardiomegaly stable.  Diffuse bilateral pulmonary air space disease shows no significant interval change.  AICD remains in appropriate position.  Prior CABG again noted.  IMPRESSION: Stable cardiomegaly and bilateral diffuse pulmonary air space disease/edema.  Original Report Authenticated By: Danae Orleans, M.D.     Recent Results (from the past 720 hour(s))  MRSA PCR SCREENING     Status: Normal   Collection Time   09/14/11  8:49 AM      Component Value Range Status Comment   MRSA by PCR NEGATIVE  NEGATIVE  Final   CULTURE, BLOOD (ROUTINE X 2)     Status: Normal   Collection Time   10/04/11  2:15 AM      Component Value Range Status Comment   Specimen Description BLOOD RIGHT FINGER   Final    Special Requests BOTTLES DRAWN AEROBIC ONLY [facility]   Final    Culture  Setup Time [id number]   Final    Culture     Final  Value: METHICILLIN RESISTANT STAPHYLOCOCCUS AUREUS     Note: RIFAMPIN AND GENTAMICIN SHOULD NOT BE USED AS SINGLE DRUGS FOR TREATMENT OF STAPH INFECTIONS. This organism DOES NOT demonstrate inducible Clindamycin resistance in vitro. CRITICAL RESULT CALLED TO, READ BACK BY AND VERIFIED WITH: JENAE M @ 1430      10/06/11 BY KRAWS     Note: Gram Stain Report Called to,Read Back By and Verified With: Elsie Lincoln RN on 10/05/11 at 00:35 by Christie Nottingham   Report Status 10/07/2011 FINAL   Final    Organism ID, Bacteria METHICILLIN RESISTANT STAPHYLOCOCCUS AUREUS   Final   CULTURE, BLOOD (ROUTINE X 2)     Status: Normal   Collection Time   10/04/11  2:30 AM      Component Value Range Status Comment   Specimen Description BLOOD RIGHT HAND   Final    Special Requests BOTTLES DRAWN AEROBIC ONLY 5CC   Final    Culture  Setup Time [id number]   Final    Culture     Final    Value: STAPHYLOCOCCUS AUREUS     Note:  SUSCEPTIBILITIES PERFORMED ON PREVIOUS CULTURE WITHIN THE LAST 5 DAYS.     Note: Gram Stain Report Called to,Read Back By and Verified With: Elsie Lincoln RN on 10/05/11 at 00:35 by Christie Nottingham   Report Status 10/07/2011 FINAL   Final   BODY FLUID CULTURE     Status: Normal   Collection Time   10/04/11  6:45 AM      Component Value Range Status Comment   Specimen Description PERITONEAL FLUID   Final    Special Requests 1 CUP   Final    Gram Stain     Final    Value: WBC PRESENT, PREDOMINANTLY PMN     NO ORGANISMS SEEN   Culture NO GROWTH 3 DAYS   Final    Report Status 10/07/2011 FINAL   Final      Impression/Recommendation  61 year old with ESRD on PD with MRSA bacteremia without clear source  1) MRSA bacteremia:   --continue vancomycin --recheck blood culures to ensure clearing her blood --IF aggressive care being pursued would get TEE to look for endocarditis --would rx for 6 weeks regardless --despite the fact that her PD site does not appear infected and cultures are negative here, worry a little about it. It is not an obvious source, will look pt over thoroughly again  2) Diffuse airspace opacities: more likely pulmonary edema, but OK to give her 7 days of GNR coverage --will change zosyn to ceftazidime  Thank you so much for this interesting consult,   Acey Lav 10/08/2011, 11:35 AM   [phone] (pager) [phone] (office)

## 2011-10-08 NOTE — Consult Note (Signed)
I was asked to see this patient because of possible peri-rectal abscess.  On the right side there was a firm anterior peri-rectal area which disappeared when I removed a significant amount of impacted, hard stool.  She does not have a peri-rectal abscess.  No need for CT.  I will cancel.  Once the impacted stoll was eliminated, the area of question became soft.  There is no pus.  Not a cause for her fevers.  She does have internal and external hemorrhoids and a cystocele.Marta Lamas Gae Bon, MD, FACS [phone] [facility]

## 2011-10-08 NOTE — Progress Notes (Signed)
[name]   Subjective:   Interval History: none.  Objective:  Vital signs in last 24 hours:  Temp:  [98 F (36.7 C)-99.8 F (37.7 C)] 98.6 F (37 C) (04/25 0428) Pulse Rate:  [82-99] 99  (04/25 0428) Resp:  [22-24] 22  (04/25 0428) BP: (96-133)/(55-73) 104/73 mmHg (04/25 0428) SpO2:  [94 %-99 %] 94 % (04/25 0428) Weight:  [66.8 kg (147 lb 4.3 oz)] 66.8 kg (147 lb 4.3 oz) (04/24 2000)  Weight change: -0.1 kg (-3.5 oz) Filed Weights   10/05/11 1936 10/06/11 2145 10/07/11 2000  Weight: 65 kg (143 lb 4.8 oz) 66.9 kg (147 lb 7.8 oz) 66.8 kg (147 lb 4.3 oz)    Intake/Output: I/O last 3 completed shifts: In: 1490 [P.O.:840; IV Piggyback:650] Out: 1 [Stool:1]   Intake/Output this shift:     CVS- RRR RS- CTA ABD- BS present soft non-distended EXT- no edema   Basic Metabolic Panel:  Lab 10/08/11 8119 10/07/11 0600 10/06/11 0630 10/05/11 0700 10/04/11 0515  NA 132* 130* 132* 129* 132*  K 4.9 3.6 3.2* 4.0 5.1  CL 93* 90* 90* 88* 89*  CO2 22 24 25 23 24   GLUCOSE 160* 193* 116* 151* 101*  BUN 64* 67* 75* 86* 89*  CREATININE 4.99* 5.43* 6.24* 7.46* 8.76*  CALCIUM 9.5 9.4 9.0 -- --  MG -- -- -- -- --  PHOS -- -- -- 6.1* --    Liver Function Tests:  Lab 10/08/11 0500 10/07/11 0600 10/06/11 0630 10/05/11 0700 10/03/11 1851  AST 18 18 43* -- 19  ALT 24 35 56* -- 18  ALKPHOS 317* 385* 369* -- 282*  BILITOT 0.5 0.4 0.4 -- 0.3  PROT 5.7* 5.6* 5.3* -- 6.3  ALBUMIN 1.2* 1.2* 1.3* 1.4* 2.0*    Lab 10/08/11 0500 10/05/11 0700 10/03/11 1851  LIPASE 15 10* 10*  AMYLASE 28 -- --    Lab 10/06/11 0630 10/05/11 1854  AMMONIA 53 54    CBC:  Lab 10/08/11 0500 10/07/11 0600 10/06/11 0630 10/05/11 0700 10/04/11 0515 10/04/11 0056 10/03/11 1851  WBC 18.3* 18.1* 13.1* 17.2* 21.0* -- --  NEUTROABS -- 16.5* 11.9* -- -- 20.3* 22.3*  HGB 7.9* 8.1* 7.6* 7.5* 8.2* -- --  HCT 24.6* 24.8* 23.3* 22.1* 24.8* -- --  MCV 86.6 86.1 86.6 84.7 85.5 -- --    PLT PENDING 58* 58* 83* 115* -- --    Cardiac Enzymes:  Lab 10/05/11 1843  CKTOTAL 10  CKMB 1.5  CKMBINDEX --  TROPONINI <0.30    BNP: No components found with this basename: POCBNP:5  CBG:  Lab 10/08/11 0423 10/08/11 0021 10/07/11 1959 10/07/11 1645 10/07/11 1138  GLUCAP 130* 139* 202* 197* 234*    Microbiology: Results for orders placed during the hospital encounter of 10/03/11  CULTURE, BLOOD (ROUTINE X 2)     Status: Normal   Collection Time   10/04/11  2:15 AM      Component Value Range Status Comment   Specimen Description BLOOD RIGHT FINGER   Final    Special Requests BOTTLES DRAWN AEROBIC ONLY [facility]   Final    Culture  Setup Time [id number]   Final    Culture     Final    Value: METHICILLIN RESISTANT STAPHYLOCOCCUS AUREUS     Note: RIFAMPIN AND GENTAMICIN SHOULD NOT BE USED AS SINGLE DRUGS FOR TREATMENT OF STAPH INFECTIONS. This organism DOES NOT demonstrate inducible Clindamycin resistance in vitro. CRITICAL RESULT CALLED TO, READ BACK  BY AND VERIFIED WITH: JENAE M @ 1430      10/06/11 BY KRAWS     Note: Gram Stain Report Called to,Read Back By and Verified With: Elsie Lincoln RN on 10/05/11 at 00:35 by Christie Nottingham   Report Status 10/07/2011 FINAL   Final    Organism ID, Bacteria METHICILLIN RESISTANT STAPHYLOCOCCUS AUREUS   Final   CULTURE, BLOOD (ROUTINE X 2)     Status: Normal   Collection Time   10/04/11  2:30 AM      Component Value Range Status Comment   Specimen Description BLOOD RIGHT HAND   Final    Special Requests BOTTLES DRAWN AEROBIC ONLY 5CC   Final    Culture  Setup Time [id number]   Final    Culture     Final    Value: STAPHYLOCOCCUS AUREUS     Note: SUSCEPTIBILITIES PERFORMED ON PREVIOUS CULTURE WITHIN THE LAST 5 DAYS.     Note: Gram Stain Report Called to,Read Back By and Verified With: Elsie Lincoln RN on 10/05/11 at 00:35 by Christie Nottingham   Report Status 10/07/2011 FINAL   Final   BODY FLUID CULTURE     Status: Normal   Collection Time    10/04/11  6:45 AM      Component Value Range Status Comment   Specimen Description PERITONEAL FLUID   Final    Special Requests 1 CUP   Final    Gram Stain     Final    Value: WBC PRESENT, PREDOMINANTLY PMN     NO ORGANISMS SEEN   Culture NO GROWTH 3 DAYS   Final    Report Status 10/07/2011 FINAL   Final     Coagulation Studies: No results found for this basename: LABPROT:5,INR:5 in the last 72 hours  Urinalysis: No results found for this basename: COLORURINE:2,APPERANCEUR:2,LABSPEC:2,PHURINE:2,GLUCOSEU:2,HGBUR:2,BILIRUBINUR:2,KETONESUR:2,PROTEINUR:2,UROBILINOGEN:2,NITRITE:2,LEUKOCYTESUR:2 in the last 72 hours    Imaging: Dg Chest 1 View  10/06/2011  *RADIOLOGY REPORT*  Clinical Data: Follow-up congestive heart failure. Altered mental status.  Coronary artery disease.  CHEST - 1 VIEW  Comparison: 10/05/2011  Findings: Cardiomegaly stable.  Diffuse bilateral pulmonary air space disease shows no significant interval change.  AICD remains in appropriate position.  Prior CABG again noted.  IMPRESSION: Stable cardiomegaly and bilateral diffuse pulmonary air space disease/edema.  Original Report Authenticated By: Danae Orleans, M.D.     Medications:        . carvedilol  6.25 mg Oral BID WC  . darbepoetin (ARANESP) injection - NON-DIALYSIS  40 mcg Subcutaneous Q Sun-1800  . doxazosin  2 mg Oral QHS  . feeding supplement  1 Container Oral TID BM  . insulin aspart  0-9 Units Subcutaneous TID WC  . insulin glargine  5 Units Subcutaneous Daily  . multivitamin  1 tablet Oral QHS  . pantoprazole (PROTONIX) IV  40 mg Intravenous Q24H  . piperacillin-tazobactam (ZOSYN)  IV  2.25 g Intravenous Q8H  . potassium chloride  40 mEq Oral BID  . ranolazine  500 mg Oral Daily  . sevelamer  3,200 mg Oral TID WC  . simvastatin  10 mg Oral q1800  . sodium chloride  3 mL Intravenous Q12H  . vancomycin  1,750 mg Intravenous Once  . DISCONTD: ampicillin-sulbactam (UNASYN) IV  3 g Intravenous QHS  .  DISCONTD: fentaNYL  25 mcg Transdermal Q72H   acetaminophen, acetaminophen, heparin, HYDROmorphone, hydrOXYzine, ondansetron (ZOFRAN) IV, ondansetron, zolpidem  Assessment/ Plan:  ESRD- CAPD 1.5% bags blood pressure a little low  HTN controlled  Anemia stable trending down Abdominal pain PD may be aggravating. Quite low albumin. MRSA bacteremia. Source unknown awaiting Echo Neuro confused at times Consider NGT  Discussed with her daughter. Patient has voiced a refusal to want hemodialysis in the past.   We will continue PD for now   LOS: 5 [name] W @TODAY @8 :37 AM

## 2011-10-09 DIAGNOSIS — R7881 Bacteremia: Secondary | ICD-10-CM

## 2011-10-09 LAB — BASIC METABOLIC PANEL
BUN: 59 mg/dL — ABNORMAL HIGH (ref 6–23)
Calcium: 9.2 mg/dL (ref 8.4–10.5)
Creatinine, Ser: 4.69 mg/dL — ABNORMAL HIGH (ref 0.50–1.10)
GFR calc Af Amer: 11 mL/min — ABNORMAL LOW (ref >90)
GFR calc non Af Amer: 9 mL/min — ABNORMAL LOW (ref >90)

## 2011-10-09 LAB — CBC
HCT: 24.1 % — ABNORMAL LOW (ref 36.0–46.0)
MCH: 28.9 pg (ref 26.0–34.0)
MCHC: 32.4 g/dL (ref 30.0–36.0)
MCV: 89.3 fL (ref 78.0–100.0)
Platelets: 41 10*3/uL — ABNORMAL LOW (ref 150–400)
RDW: 19.5 % — ABNORMAL HIGH (ref 11.5–15.5)

## 2011-10-09 LAB — GLUCOSE, CAPILLARY
Glucose-Capillary: 174 mg/dL — ABNORMAL HIGH (ref 70–99)
Glucose-Capillary: 150 mg/dL — ABNORMAL HIGH (ref 70–99)
Glucose-Capillary: 144 mg/dL — ABNORMAL HIGH (ref 70–99)

## 2011-10-09 MED ORDER — DELFLEX-LC/1.5% DEXTROSE 346 MOSM/L IP SOLN
INTRAPERITONEAL | Status: DC
  Administered 2011-10-09: 17:00:00 via INTRAPERITONEAL

## 2011-10-09 MED ORDER — SODIUM CHLORIDE 0.9 % IV SOLN
Freq: Once | INTRAVENOUS | Status: AC
  Administered 2011-10-09: 01:00:00 via INTRAVENOUS

## 2011-10-09 MED ORDER — SODIUM CHLORIDE 0.9 % IV BOLUS (SEPSIS)
250.0000 mL | Freq: Once | INTRAVENOUS | Status: AC
  Administered 2011-10-09: 250 mL via INTRAVENOUS

## 2011-10-09 MED ORDER — DEXTROSE 5 % IV SOLN
500.0000 mg | INTRAVENOUS | Status: DC
  Administered 2011-10-09 – 2011-10-10 (×2): 500 mg via INTRAVENOUS
  Filled 2011-10-09 (×4): qty 0.5

## 2011-10-09 MED ORDER — FLUCONAZOLE 100 MG PO TABS
100.0000 mg | ORAL_TABLET | Freq: Every day | ORAL | Status: DC
  Filled 2011-10-09: qty 1

## 2011-10-09 MED ORDER — SODIUM CHLORIDE 0.9 % IV SOLN
100.0000 mg | INTRAVENOUS | Status: DC
  Administered 2011-10-09 – 2011-10-14 (×5): 100 mg via INTRAVENOUS
  Filled 2011-10-09 (×8): qty 100

## 2011-10-09 NOTE — Progress Notes (Signed)
Subjective: Pt now with yeast on repeat blood cultures   Antibiotics:  Anti-infectives     Start     Dose/Rate Route Frequency Ordered Stop   10/09/11 1400   fluconazole (DIFLUCAN) tablet 100 mg  Status:  Discontinued        100 mg Oral Daily 10/09/11 1237 10/09/11 1312   10/09/11 1400   micafungin (MYCAMINE) 100 mg in sodium chloride 0.9 % 100 mL IVPB        100 mg 100 mL/hr over 1 Hours Intravenous Every 24 hours 10/09/11 1313     10/09/11 1300   cefTAZidime (FORTAZ) 500 mg in dextrose 5 % 50 mL IVPB        500 mg 100 mL/hr over 30 Minutes Intravenous Every 24 hours 10/09/11 0918     10/08/11 1300   cefTAZidime (FORTAZ) 1 g in dextrose 5 % 50 mL IVPB  Status:  Discontinued        1 g 100 mL/hr over 30 Minutes Intravenous Every 24 hours 10/08/11 1151 10/09/11 0917   10/07/11 1000   piperacillin-tazobactam (ZOSYN) IVPB 2.25 g  Status:  Discontinued        2.25 g 100 mL/hr over 30 Minutes Intravenous 3 times per day 10/07/11 0927 10/08/11 1145   10/07/11 0930   vancomycin (VANCOCIN) 1,750 mg in sodium chloride 0.9 % 500 mL IVPB        1,750 mg 250 mL/hr over 120 Minutes Intravenous  Once 10/07/11 0927 10/07/11 1241   10/05/11 0300   vancomycin (VANCOCIN) 1,750 mg in sodium chloride 0.9 % 500 mL IVPB        1,750 mg 250 mL/hr over 120 Minutes Intravenous  Once 10/05/11 0204 10/05/11 0500   10/04/11 0300   Ampicillin-Sulbactam (UNASYN) 3 g in sodium chloride 0.9 % 100 mL IVPB  Status:  Discontinued     Comments: Unasyn 3 g IV q24h on Peritoneal Dialysis      3 g 100 mL/hr over 60 Minutes Intravenous Daily at bedtime 10/04/11 0151 10/07/11 0922          Medications: Scheduled Meds:   . sodium chloride   Intravenous Once  . cefTAZidime (FORTAZ)  IV  500 mg Intravenous Q24H  . darbepoetin (ARANESP) injection - NON-DIALYSIS  40 mcg Subcutaneous Q Sun-1800  . feeding supplement  1 Container Oral TID BM  . insulin aspart  0-9 Units Subcutaneous TID WC  . insulin glargine  5  Units Subcutaneous Daily  . iohexol  20 mL Oral Q1 Hr x 2  . micafungin (MYCAMINE) IV  100 mg Intravenous Q24H  . multivitamin  1 tablet Oral QHS  . pantoprazole (PROTONIX) IV  40 mg Intravenous Q24H  . ranolazine  500 mg Oral Daily  . sevelamer  3,200 mg Oral TID WC  . simvastatin  10 mg Oral q1800  . sodium chloride  250 mL Intravenous Once  . sodium chloride  3 mL Intravenous Q12H  . DISCONTD: carvedilol  6.25 mg Oral BID WC  . DISCONTD: cefTAZidime (FORTAZ)  IV  1 g Intravenous Q24H  . DISCONTD: doxazosin  2 mg Oral QHS  . DISCONTD: fluconazole  100 mg Oral Daily   Continuous Infusions:   . dialysis solution 1.5% low-MG/low-CA     PRN Meds:.acetaminophen, acetaminophen, heparin, HYDROmorphone, hydrOXYzine, ondansetron (ZOFRAN) IV, ondansetron, DISCONTD: zolpidem   Objective: Weight change: 0 lb (0 kg)  Intake/Output Summary (Last 24 hours) at 10/09/11 1753 Last data filed at 10/09/11 1346  Gross per 24 hour  Intake    443 ml  Output      0 ml  Net    443 ml   Blood pressure 98/69, pulse 86, temperature 97.6 F (36.4 C), temperature source Oral, resp. rate 17, height 5\' 4"  (1.626 m), weight 147 lb 4.3 oz (66.8 kg), SpO2 99.00%. Temp:  [97 F (36.1 C)-98.6 F (37 C)] 97.6 F (36.4 C) (04/26 1648) Pulse Rate:  [67-107] 86  (04/26 1648) Resp:  [17-20] 17  (04/26 1648) BP: (74-125)/(49-84) 98/69 mmHg (04/26 1648) SpO2:  [91 %-100 %] 99 % (04/26 1648) Weight:  [147 lb 4.3 oz (66.8 kg)] 147 lb 4.3 oz (66.8 kg) (04/25 2300)  Physical Exam: General: Much more awake and alert but still not fully oriented  HEENT: anicteric sclera, pupils reactive to light and accommodation, EOMI, oropharynx clear and without exudate  CVS: tachycardic , normal r, no murmur rubs or gallops  Chest: clear to auscultation bilaterally, no wheezing, rales or rhonchi  Abdomen: tender around PD site which is not obviously purulent, n nondistended, normal bowel sounds,  Extremities 2+ edema  Skin:  no rashes  Neuro: nonfocal, strength and sensation intact   Lab Results:  Basename 10/09/11 0555 10/08/11 0500  WBC 11.7* 18.3*  HGB 7.8* 7.9*  HCT 24.1* 24.6*  PLT 41* 59*    BMET  Basename 10/09/11 0555 10/08/11 0500  NA 130* 132*  K 4.5 4.9  CL 93* 93*  CO2 24 22  GLUCOSE 181* 160*  BUN 59* 64*  CREATININE 4.69* 4.99*  CALCIUM 9.2 9.5    Micro Results: Recent Results (from the past 240 hour(s))  CULTURE, BLOOD (ROUTINE X 2)     Status: Normal   Collection Time   10/04/11  2:15 AM      Component Value Range Status Comment   Specimen Description BLOOD RIGHT FINGER   Final    Special Requests BOTTLES DRAWN AEROBIC ONLY [facility]   Final    Culture  Setup Time [id number]   Final    Culture     Final    Value: METHICILLIN RESISTANT STAPHYLOCOCCUS AUREUS     Note: RIFAMPIN AND GENTAMICIN SHOULD NOT BE USED AS SINGLE DRUGS FOR TREATMENT OF STAPH INFECTIONS. This organism DOES NOT demonstrate inducible Clindamycin resistance in vitro. CRITICAL RESULT CALLED TO, READ BACK BY AND VERIFIED WITH: JENAE M @ 1430      10/06/11 BY KRAWS     Note: Gram Stain Report Called to,Read Back By and Verified With: Elsie Lincoln RN on 10/05/11 at 00:35 by Christie Nottingham   Report Status 10/07/2011 FINAL   Final    Organism ID, Bacteria METHICILLIN RESISTANT STAPHYLOCOCCUS AUREUS   Final   CULTURE, BLOOD (ROUTINE X 2)     Status: Normal   Collection Time   10/04/11  2:30 AM      Component Value Range Status Comment   Specimen Description BLOOD RIGHT HAND   Final    Special Requests BOTTLES DRAWN AEROBIC ONLY 5CC   Final    Culture  Setup Time [id number]   Final    Culture     Final    Value: STAPHYLOCOCCUS AUREUS     Note: SUSCEPTIBILITIES PERFORMED ON PREVIOUS CULTURE WITHIN THE LAST 5 DAYS.     Note: Gram Stain Report Called to,Read Back By and Verified With: Elsie Lincoln RN on 10/05/11 at 00:35 by Christie Nottingham   Report Status 10/07/2011 FINAL   Final   BODY  FLUID CULTURE     Status: Normal    Collection Time   10/04/11  6:45 AM      Component Value Range Status Comment   Specimen Description PERITONEAL FLUID   Final    Special Requests 1 CUP   Final    Gram Stain     Final    Value: WBC PRESENT, PREDOMINANTLY PMN     NO ORGANISMS SEEN   Culture NO GROWTH 3 DAYS   Final    Report Status 10/07/2011 FINAL   Final   CULTURE, BLOOD (ROUTINE X 2)     Status: Normal (Preliminary result)   Collection Time   10/07/11  6:20 PM      Component Value Range Status Comment   Specimen Description BLOOD RIGHT HAND   Final    Special Requests BOTTLES DRAWN AEROBIC ONLY 1.5CC   Final    Culture  Setup Time [id number]   Final    Culture     Final    Value:        BLOOD CULTURE RECEIVED NO GROWTH TO DATE CULTURE WILL BE HELD FOR 5 DAYS BEFORE ISSUING A FINAL NEGATIVE REPORT   Report Status PENDING   Incomplete   CULTURE, BLOOD (ROUTINE X 2)     Status: Normal (Preliminary result)   Collection Time   10/07/11  6:32 PM      Component Value Range Status Comment   Specimen Description BLOOD RIGHT ARM   Final    Special Requests BOTTLES DRAWN AEROBIC AND ANAEROBIC 10CC   Final    Culture  Setup Time [id number]   Final    Culture     Final    Value: YEAST     Note: Gram Stain Report Called to,Read Back By and Verified With: ANN MCCRAW @1155  10/09/11 BY KRAWS   Report Status PENDING   Incomplete     Studies/Results: No results found.    Assessment/Plan: [name] is a 61 y.o. female withwith ESRD on PD with MRSA bacteremia without clear source. Also with Candida and one out of 2 blood cultures. She does have a pancreatic pseudocyst on CT scan  1) MRSA bacteremia:  --continue vancomycin  --Repeat blood cultures again --IF aggressive care being pursued would get TEE to look for endocarditis  --would rx for 6 weeks regardless  --Source is not clear cut. Peritoneal dialysis fluid did not appear infected she does have a pseudocyst again not a typical source for my cuff on June skin  borne infection.  2) clonidine he agree with mycafungin followup ID and sensitivity testing for Candida --Repeat blood cultures  --Patient will need dedicated a funduscopic exam by ophthalmology to exclude endophthalmitis. --I wonder if her pseudocyst could be a source of her anemia possibly  #3) pseudocysts weighted image again with repeat CT scan in light of her newly diagnosed fungemia. Consider aspiration of this pseudocyst to ensure it is not an abscess.   4) Diffuse airspace opacities: more likely pulmonary edema, but OK to give her 7 days of GNR coverage  --Currently on vancomycin and ceftazidime   LOS: 6 days   Acey Lav 10/09/2011, 5:53 PM

## 2011-10-09 NOTE — Progress Notes (Addendum)
Appreciate CCS/renal/ID. Blood culture growing fungus in 1 bottle. BP has been low.  SUBJECTIVE "ok". Patient somnolent.   1. Pancreatic pseudocyst   2. Epigastric abdominal pain   3. End stage renal disease     Past Medical History  Diagnosis Date  . Gallstone pancreatitis   . Coronary artery disease     s/p CABG 2008 with multiple PCIs  . Ischemic cardiomyopathy   . End stage renal disease   . Diabetes mellitus   . Hypertension   . History of nonadherence to medical treatment   . LBBB (left bundle branch block)   . Hyperlipidemia   . Renal insufficiency   . CHF (congestive heart failure)   . Dialysis patient   . Anemia    Current Facility-Administered Medications  Medication Dose Route Frequency Provider Last Rate Last Dose  . 0.9 %  sodium chloride infusion   Intravenous Once Caroline More, NP 999 mL/hr at 10/09/11 0051    . acetaminophen (TYLENOL) tablet 650 mg  650 mg Oral Q6H PRN Ron Parker, MD   650 mg at 10/08/11 1001   Or  . acetaminophen (TYLENOL) suppository 650 mg  650 mg Rectal Q6H PRN Ron Parker, MD      . carvedilol (COREG) tablet 6.25 mg  6.25 mg Oral BID WC Ron Parker, MD   6.25 mg at 10/09/11 0809  . cefTAZidime (FORTAZ) 500 mg in dextrose 5 % 50 mL IVPB  500 mg Intravenous Q24H Marshayla Mitschke, MD   500 mg at 10/09/11 1417  . darbepoetin (ARANESP) injection 40 mcg  40 mcg Subcutaneous Q Sun-1800 Sheffield Slider, PA   40 mcg at 10/04/11 1704  . doxazosin (CARDURA) tablet 2 mg  2 mg Oral QHS Ron Parker, MD   2 mg at 10/07/11 2105  . feeding supplement (RESOURCE BREEZE) liquid 1 Container  1 Container Oral TID BM Sheffield Slider, PA   1 Container at 10/09/11 1400  . heparin injection 500 Units  500 Units Intraperitoneal PRN Zada Girt, MD   500 Units at 10/08/11 [phone]  . HYDROmorphone (DILAUDID) injection 2 mg  2 mg Intravenous Q3H PRN Ron Parker, MD   2 mg at 10/09/11 0825  . hydrOXYzine (ATARAX/VISTARIL) tablet 10  mg  10 mg Oral Q12H PRN Clanford L Johnson, MD      . insulin aspart (novoLOG) injection 0-9 Units  0-9 Units Subcutaneous TID WC Clanford Cyndie Mull, MD   2 Units at 10/09/11 1324  . insulin glargine (LANTUS) injection 5 Units  5 Units Subcutaneous Daily Clanford Cyndie Mull, MD   5 Units at 10/09/11 1025  . iohexol (OMNIPAQUE) 300 MG/ML solution 20 mL  20 mL Oral Q1 Hr x 2 Medication Radiologist, MD   20 mL at 10/08/11 1952  . micafungin (MYCAMINE) 100 mg in sodium chloride 0.9 % 100 mL IVPB  100 mg Intravenous Q24H Randall Hiss, MD      . multivitamin (RENA-VIT) tablet 1 tablet  1 tablet Oral QHS Rhetta Mura, MD   1 tablet at 10/08/11 2155  . ondansetron (ZOFRAN) tablet 4 mg  4 mg Oral Q6H PRN Ron Parker, MD       Or  . ondansetron (ZOFRAN) injection 4 mg  4 mg Intravenous Q6H PRN Ron Parker, MD   4 mg at 10/04/11 0415  . pantoprazole (PROTONIX) injection 40 mg  40 mg Intravenous Q24H Ron Parker, MD  40 mg at 10/09/11 0015  . ranolazine (RANEXA) 12 hr tablet 500 mg  500 mg Oral Daily Ron Parker, MD   500 mg at 10/09/11 1025  . sevelamer (RENVELA) tablet 3,200 mg  3,200 mg Oral TID WC Delynn Olvera, MD   3,200 mg at 10/08/11 1700  . simvastatin (ZOCOR) tablet 10 mg  10 mg Oral q1800 Ron Parker, MD   10 mg at 10/08/11 1700  . sodium chloride 0.9 % injection 3 mL  3 mL Intravenous Q12H Ron Parker, MD   3 mL at 10/09/11 1026  . zolpidem (AMBIEN) tablet 5 mg  5 mg Oral QHS PRN Ron Parker, MD      . DISCONTD: cefTAZidime (FORTAZ) 1 g in dextrose 5 % 50 mL IVPB  1 g Intravenous Q24H Dorathy Stallone, MD   1 g at 10/08/11 1334  . DISCONTD: fluconazole (DIFLUCAN) tablet 100 mg  100 mg Oral Daily Conley Canal, MD       Allergies  Allergen Reactions  . Morphine And Related Anaphylaxis  . Codeine Itching  . Lisinopril     Language changed   Principal Problem:  *Epigastric abdominal pain Active Problems:  Ischemic cardiomyopathy   Chronic systolic congestive heart failure  End stage renal disease  Pancreatic pseudocyst  Pancreatitis chronic  Hyperkalemia  Hyponatremia   Vital signs in last 24 hours: Temp:  [97 F (36.1 C)-98.6 F (37 C)] 98.6 F (37 C) (04/26 1346) Pulse Rate:  [67-107] 92  (04/26 1346) Resp:  [17-20] 17  (04/26 1346) BP: (74-125)/(49-84) 103/64 mmHg (04/26 1346) SpO2:  [91 %-100 %] 91 % (04/26 1346) Weight:  [66.8 kg (147 lb 4.3 oz)] 66.8 kg (147 lb 4.3 oz) (04/25 2300) Weight change: 0 kg (0 lb) Last BM Date: 10/07/11  Intake/Output from previous day: 04/25 0701 - 04/26 0700 In: 590 [P.O.:540; IV Piggyback:50] Out: 0  Intake/Output this shift: Total I/O In: 263 [P.O.:260; I.V.:3] Out: -   Lab Results:  Basename 10/09/11 0555 10/08/11 0500  WBC 11.7* 18.3*  HGB 7.8* 7.9*  HCT 24.1* 24.6*  PLT 41* 59*   BMET  Basename 10/09/11 0555 10/08/11 0500  NA 130* 132*  K 4.5 4.9  CL 93* 93*  CO2 24 22  GLUCOSE 181* 160*  BUN 59* 64*  CREATININE 4.69* 4.99*  CALCIUM 9.2 9.5    Studies/Results: No results found.  Medications: I have reviewed the patient's current medications.   Physical exam GENERAL- alert HEAD- normal atraumatic, no neck masses, normal thyroid, no jvd RESPIRATORY- appears well, vitals normal, no respiratory distress, acyanotic, normal RR, ear and throat exam is normal, neck free of mass or lymphadenopathy, chest clear, no wheezing, crepitations, rhonchi, normal symmetric air entry CVS- regular rate and rhythm, S1, S2 normal, no murmur, click, rub or gallop ABDOMEN- abdomen is soft without significant tenderness, masses, organomegaly or guarding NEURO- somnolent. EXTREMITIES- extremities normal, atraumatic, no cyanosis or edema  Plan  * MRSA bacteremia/Fungemia- added micafungin. Wonder if improvement in leukocytosis true. ? cause of hypotension, may be related to fungemia. Give Ns bolus, mindful of esrd. * Ischemic cardiomyopathy/ Chronic systolic  congestive heart failure(20%)- stable.  * End stage renal disease-on PD. Appreciate renal.  * Pancreatic pseudocyst/ Pancreatitis chronic- tolerating feeding, refusing ngt. ?drain pseudocyst  * Anemia of chronic kidney disease- some drop in hb, no overt bleeding. Will give unit of blood in view of ongoing fungemia.*Thrombocytopenia- may be related to ongoing infection. Monitor. * encephalopathy- likely  related to infection.  Dvt/gi prophylaxis.     Blong Busk 10/09/2011 3:39 PM Pager: [phone].

## 2011-10-09 NOTE — Progress Notes (Signed)
BP 92/55 HR 67 post NS bolus.No acute distress noted.Donnamarie Poag notified.Will continue to monitor. Sully Manzi Joselita,RN

## 2011-10-09 NOTE — Plan of Care (Signed)
Problem: Inadequate Intake (NI-2.1) Goal: Food and/or nutrient delivery Individualized approach for food/nutrient provision.  Outcome: Progressing Patient is drinking Regulatory affairs officer supplement, however overall intake is still poor.

## 2011-10-09 NOTE — Progress Notes (Signed)
Abx consult:  Pt now with yeast in blood culture. Currently on vanc for MRSA bacteremia and Fortaz for PNA. Fluconazole 100mg  was order but prob inadequate for fungemia. D/w Dr. Daiva Eves who is seeing the patient, will switch to micafungin.   Plan  Micafungin 100mg  IV q24

## 2011-10-09 NOTE — Progress Notes (Signed)
Started NS bolus but IV site infiltrated,RN attempted to restart iv x2 but unable.Paged IV RN for IV restart.Patient more alert and can carry conversation,able to drink water offered.Will continue to monitor. Michi Herrmann Joselita,RN

## 2011-10-09 NOTE — Progress Notes (Signed)
IV RN able to get IV site,NS bolus restarted,will continue to monitor. Estee Yohe Joselita,RN

## 2011-10-09 NOTE — Progress Notes (Signed)
[facility] ROUNDING NOTE   Subjective:   Interval History: none.  Objective:  Vital signs in last 24 hours:  Temp:  [97 F (36.1 C)-98 F (36.7 C)] 97 F (36.1 C) (04/26 0910) Pulse Rate:  [67-107] 72  (04/26 0910) Resp:  [18-20] 18  (04/26 0910) BP: (74-125)/(49-84) 88/55 mmHg (04/26 0910) SpO2:  [92 %-100 %] 92 % (04/26 0910) Weight:  [66.8 kg (147 lb 4.3 oz)] 66.8 kg (147 lb 4.3 oz) (04/25 2300)  Weight change: 0 kg (0 lb) Filed Weights   10/06/11 2145 10/07/11 2000 10/08/11 2300  Weight: 66.9 kg (147 lb 7.8 oz) 66.8 kg (147 lb 4.3 oz) 66.8 kg (147 lb 4.3 oz)    Intake/Output: I/O last 3 completed shifts: In: 590 [P.O.:540; IV Piggyback:50] Out: 0    Intake/Output this shift:  Total I/O In: 120 [P.O.:120] Out: -   CVS- RRR RS- CTA ABD- BS present soft non-distended EXT- no edema   Basic Metabolic Panel:  Lab 10/09/11 1610 10/08/11 0500 10/07/11 0600 10/06/11 0630 10/05/11 0700  NA 130* 132* 130* 132* 129*  K 4.5 4.9 3.6 3.2* 4.0  CL 93* 93* 90* 90* 88*  CO2 24 22 24 25 23   GLUCOSE 181* 160* 193* 116* 151*  BUN 59* 64* 67* 75* 86*  CREATININE 4.69* 4.99* 5.43* 6.24* 7.46*  CALCIUM 9.2 9.5 9.4 -- --  MG -- -- -- -- --  PHOS -- -- -- -- 6.1*    Liver Function Tests:  Lab 10/08/11 0500 10/07/11 0600 10/06/11 0630 10/05/11 0700 10/03/11 1851  AST 18 18 43* -- 19  ALT 24 35 56* -- 18  ALKPHOS 317* 385* 369* -- 282*  BILITOT 0.5 0.4 0.4 -- 0.3  PROT 5.7* 5.6* 5.3* -- 6.3  ALBUMIN 1.2* 1.2* 1.3* 1.4* 2.0*    Lab 10/08/11 0500 10/05/11 0700 10/03/11 1851  LIPASE 15 10* 10*  AMYLASE 28 -- --    Lab 10/06/11 0630 10/05/11 1854  AMMONIA 53 54    CBC:  Lab 10/09/11 0555 10/08/11 0500 10/07/11 0600 10/06/11 0630 10/05/11 0700 10/04/11 0056 10/03/11 1851  WBC 11.7* 18.3* 18.1* 13.1* 17.2* -- --  NEUTROABS -- -- 16.5* 11.9* -- 20.3* 22.3*  HGB 7.8* 7.9* 8.1* 7.6* 7.5* -- --  HCT 24.1* 24.6* 24.8* 23.3* 22.1* -- --  MCV 89.3 86.6  86.1 86.6 84.7 -- --  PLT 41* 59* 58* 58* 83* -- --    Cardiac Enzymes:  Lab 10/05/11 1843  CKTOTAL 10  CKMB 1.5  CKMBINDEX --  TROPONINI <0.30    BNP: No components found with this basename: POCBNP:5  CBG:  Lab 10/09/11 0721 10/08/11 2211 10/08/11 1705 10/08/11 1204 10/08/11 0852  GLUCAP 174* 190* 162* 202* 206*    Microbiology: Results for orders placed during the hospital encounter of 10/03/11  CULTURE, BLOOD (ROUTINE X 2)     Status: Normal   Collection Time   10/04/11  2:15 AM      Component Value Range Status Comment   Specimen Description BLOOD RIGHT FINGER   Final    Special Requests BOTTLES DRAWN AEROBIC ONLY [facility]   Final    Culture  Setup Time [id number]   Final    Culture     Final    Value: METHICILLIN RESISTANT STAPHYLOCOCCUS AUREUS     Note: RIFAMPIN AND GENTAMICIN SHOULD NOT BE USED AS SINGLE DRUGS FOR TREATMENT OF STAPH INFECTIONS. This organism DOES NOT demonstrate inducible Clindamycin resistance in vitro. CRITICAL  RESULT CALLED TO, READ BACK BY AND VERIFIED WITH: JENAE M @ 1430      10/06/11 BY KRAWS     Note: Gram Stain Report Called to,Read Back By and Verified With: Elsie Lincoln RN on 10/05/11 at 00:35 by Christie Nottingham   Report Status 10/07/2011 FINAL   Final    Organism ID, Bacteria METHICILLIN RESISTANT STAPHYLOCOCCUS AUREUS   Final   CULTURE, BLOOD (ROUTINE X 2)     Status: Normal   Collection Time   10/04/11  2:30 AM      Component Value Range Status Comment   Specimen Description BLOOD RIGHT HAND   Final    Special Requests BOTTLES DRAWN AEROBIC ONLY 5CC   Final    Culture  Setup Time [id number]   Final    Culture     Final    Value: STAPHYLOCOCCUS AUREUS     Note: SUSCEPTIBILITIES PERFORMED ON PREVIOUS CULTURE WITHIN THE LAST 5 DAYS.     Note: Gram Stain Report Called to,Read Back By and Verified With: Elsie Lincoln RN on 10/05/11 at 00:35 by Christie Nottingham   Report Status 10/07/2011 FINAL   Final   BODY FLUID CULTURE     Status: Normal    Collection Time   10/04/11  6:45 AM      Component Value Range Status Comment   Specimen Description PERITONEAL FLUID   Final    Special Requests 1 CUP   Final    Gram Stain     Final    Value: WBC PRESENT, PREDOMINANTLY PMN     NO ORGANISMS SEEN   Culture NO GROWTH 3 DAYS   Final    Report Status 10/07/2011 FINAL   Final   CULTURE, BLOOD (ROUTINE X 2)     Status: Normal (Preliminary result)   Collection Time   10/07/11  6:20 PM      Component Value Range Status Comment   Specimen Description BLOOD RIGHT HAND   Final    Special Requests BOTTLES DRAWN AEROBIC ONLY 1.5CC   Final    Culture  Setup Time [id number]   Final    Culture     Final    Value:        BLOOD CULTURE RECEIVED NO GROWTH TO DATE CULTURE WILL BE HELD FOR 5 DAYS BEFORE ISSUING A FINAL NEGATIVE REPORT   Report Status PENDING   Incomplete     Coagulation Studies: No results found for this basename: LABPROT:5,INR:5 in the last 72 hours  Urinalysis: No results found for this basename: COLORURINE:2,APPERANCEUR:2,LABSPEC:2,PHURINE:2,GLUCOSEU:2,HGBUR:2,BILIRUBINUR:2,KETONESUR:2,PROTEINUR:2,UROBILINOGEN:2,NITRITE:2,LEUKOCYTESUR:2 in the last 72 hours    Imaging: No results found.   Medications:        . sodium chloride   Intravenous Once  . carvedilol  6.25 mg Oral BID WC  . cefTAZidime (FORTAZ)  IV  500 mg Intravenous Q24H  . darbepoetin (ARANESP) injection - NON-DIALYSIS  40 mcg Subcutaneous Q Sun-1800  . doxazosin  2 mg Oral QHS  . feeding supplement  1 Container Oral TID BM  . insulin aspart  0-9 Units Subcutaneous TID WC  . insulin glargine  5 Units Subcutaneous Daily  . iohexol  20 mL Oral Q1 Hr x 2  . multivitamin  1 tablet Oral QHS  . pantoprazole (PROTONIX) IV  40 mg Intravenous Q24H  . ranolazine  500 mg Oral Daily  . sevelamer  3,200 mg Oral TID WC  . simvastatin  10 mg Oral q1800  . sodium chloride  3 mL Intravenous  Q12H  . white petrolatum      . DISCONTD: cefTAZidime (FORTAZ)  IV  1 g  Intravenous Q24H  . DISCONTD: piperacillin-tazobactam (ZOSYN)  IV  2.25 g Intravenous Q8H  . DISCONTD: sevelamer  3,200 mg Oral TID WC   acetaminophen, acetaminophen, heparin, HYDROmorphone, hydrOXYzine, ondansetron (ZOFRAN) IV, ondansetron, zolpidem  Assessment/ Plan:  ESRD- CAPD 1.5% bags blood pressure a little low  HTN controlled  Anemia stable trending down  Abdominal pain PD may be aggravating. Quite low albumin.  MRSA bacteremia. Source unknown awaiting Echo  Neuro confused at times  Consider NGT  Discussed with her daughter. Patient has voiced a refusal to want hemodialysis in the past.  We will continue PD for now   LOS: 6 [name] W @TODAY @9 :34 AM

## 2011-10-09 NOTE — Progress Notes (Signed)
Nutrition Follow-up  Diet Order:  Clear liquids  Patient continues to have excessive abdominal pain. Patient continues to have poor PO intake. PO intake documented between 0-75% at meals. Over the past 2 days PO intake has been between 2-30% at meals. RN reported patient likes [employer], advertising. Patient on PD for ESRD per MD note 4/25.   Meds: Scheduled Meds:   . sodium chloride   Intravenous Once  . carvedilol  6.25 mg Oral BID WC  . cefTAZidime (FORTAZ)  IV  500 mg Intravenous Q24H  . darbepoetin (ARANESP) injection - NON-DIALYSIS  40 mcg Subcutaneous Q Sun-1800  . doxazosin  2 mg Oral QHS  . feeding supplement  1 Container Oral TID BM  . fluconazole  100 mg Oral Daily  . insulin aspart  0-9 Units Subcutaneous TID WC  . insulin glargine  5 Units Subcutaneous Daily  . iohexol  20 mL Oral Q1 Hr x 2  . multivitamin  1 tablet Oral QHS  . pantoprazole (PROTONIX) IV  40 mg Intravenous Q24H  . ranolazine  500 mg Oral Daily  . sevelamer  3,200 mg Oral TID WC  . simvastatin  10 mg Oral q1800  . sodium chloride  3 mL Intravenous Q12H  . white petrolatum      . DISCONTD: cefTAZidime (FORTAZ)  IV  1 g Intravenous Q24H   Continuous Infusions:  PRN Meds:.acetaminophen, acetaminophen, heparin, HYDROmorphone, hydrOXYzine, ondansetron (ZOFRAN) IV, ondansetron, zolpidem  Labs:  CMP     Component Value Date/Time   NA 130* 10/09/2011 0555   K 4.5 10/09/2011 0555   CL 93* 10/09/2011 0555   CO2 24 10/09/2011 0555   GLUCOSE 181* 10/09/2011 0555   BUN 59* 10/09/2011 0555   CREATININE 4.69* 10/09/2011 0555   CALCIUM 9.2 10/09/2011 0555   PROT 5.7* 10/08/2011 0500   ALBUMIN 1.2* 10/08/2011 0500   AST 18 10/08/2011 0500   ALT 24 10/08/2011 0500   ALKPHOS 317* 10/08/2011 0500   BILITOT 0.5 10/08/2011 0500   GFRNONAA 9* 10/09/2011 0555   GFRAA 11* 10/09/2011 0555     Intake/Output Summary (Last 24 hours) at 10/09/11 1247 Last data filed at 10/09/11 1234  Gross per 24 hour  Intake     613 ml  Output      0 ml  Net    613 ml    Weight Status:  147 lb.  *Weight up likely due to changes in fluid status.  Re-estimated needs: 1636-1909 kcal, 65.45-70.9 grams  Nutrition Dx:  -Inadequate oral intake (NI-2.1). Status: Ongoing  Goal:   1. PO intake > 75% at meals -not yet meeting. -Continue 2. Minimize weight loss -meeting, continue 3. Diet advancement as medically able. Intake to meet > 90% if estimated energy needs. -not yet meeting. -Continue  Intervention:  Will continue to send resource breeze nutrition supplement TID.   Monitor:  PO intake, weight trends, labs, Diet advancement    Adron Bene Pager #:  [phone]

## 2011-10-09 NOTE — Progress Notes (Signed)
Patient's BP 91/58 HR 71. Jolyne Laye Joselita,RN

## 2011-10-09 NOTE — Progress Notes (Signed)
[facility] lab called a critical lab on the pt, blood culture was positive for yeast. Dr. Venetia Constable text paged results. Dr. Venetia Constable called back and orders were received.

## 2011-10-09 NOTE — Plan of Care (Signed)
Problem: Phase I Progression Outcomes Goal: EF % per last Echo/documented,Core Reminder form on chart Outcome: Completed/Met Date Met:  10/09/11 EF=20-25% per ECHO done 10/07/11

## 2011-10-10 ENCOUNTER — Encounter (HOSPITAL_COMMUNITY): Payer: Self-pay | Admitting: General Surgery

## 2011-10-10 ENCOUNTER — Inpatient Hospital Stay (HOSPITAL_COMMUNITY): Payer: Medicare Other

## 2011-10-10 DIAGNOSIS — K319 Disease of stomach and duodenum, unspecified: Secondary | ICD-10-CM

## 2011-10-10 LAB — CBC
Platelets: 37 10*3/uL — ABNORMAL LOW (ref 150–400)
RBC: 2.9 MIL/uL — ABNORMAL LOW (ref 3.87–5.11)
RDW: 18.6 % — ABNORMAL HIGH (ref 11.5–15.5)
WBC: 10 10*3/uL (ref 4.0–10.5)

## 2011-10-10 LAB — GLUCOSE, CAPILLARY
Glucose-Capillary: 170 mg/dL — ABNORMAL HIGH (ref 70–99)
Glucose-Capillary: 107 mg/dL — ABNORMAL HIGH (ref 70–99)
Glucose-Capillary: 87 mg/dL (ref 70–99)

## 2011-10-10 LAB — COMPREHENSIVE METABOLIC PANEL
ALT: 14 U/L (ref 0–35)
AST: 15 U/L (ref 0–37)
Albumin: 1 g/dL — ABNORMAL LOW (ref 3.5–5.2)
Alkaline Phosphatase: 309 U/L — ABNORMAL HIGH (ref 39–117)
CO2: 24 mEq/L (ref 19–32)
Chloride: 91 mEq/L — ABNORMAL LOW (ref 96–112)
Creatinine, Ser: 4.37 mg/dL — ABNORMAL HIGH (ref 0.50–1.10)
Potassium: 3.7 mEq/L (ref 3.5–5.1)
Sodium: 129 mEq/L — ABNORMAL LOW (ref 135–145)
Total Bilirubin: 0.3 mg/dL (ref 0.3–1.2)

## 2011-10-10 LAB — TYPE AND SCREEN
ABO/RH(D): O POS
Antibody Screen: POSITIVE
DAT, IgG: POSITIVE
Unit division: 0

## 2011-10-10 LAB — PROTIME-INR: Prothrombin Time: 18.1 seconds — ABNORMAL HIGH (ref 11.6–15.2)

## 2011-10-10 MED ORDER — HYDROMORPHONE HCL PF 1 MG/ML IJ SOLN
0.5000 mg | INTRAMUSCULAR | Status: DC | PRN
  Administered 2011-10-10 – 2011-10-12 (×9): 0.5 mg via INTRAVENOUS
  Filled 2011-10-10 (×10): qty 1

## 2011-10-10 MED ORDER — PANTOPRAZOLE SODIUM 40 MG PO TBEC
40.0000 mg | DELAYED_RELEASE_TABLET | Freq: Every day | ORAL | Status: DC
  Administered 2011-10-10: 40 mg via ORAL
  Filled 2011-10-10 (×2): qty 1

## 2011-10-10 MED ORDER — INSULIN ASPART 100 UNIT/ML ~~LOC~~ SOLN
0.0000 [IU] | Freq: Four times a day (QID) | SUBCUTANEOUS | Status: DC
  Administered 2011-10-12: 1 [IU] via SUBCUTANEOUS

## 2011-10-10 MED ORDER — IOHEXOL 300 MG/ML  SOLN
100.0000 mL | Freq: Once | INTRAMUSCULAR | Status: AC | PRN
  Administered 2011-10-10: 100 mL via INTRAVENOUS

## 2011-10-10 MED ORDER — DEXTROSE-NACL 5-0.9 % IV SOLN
INTRAVENOUS | Status: DC
  Administered 2011-10-10: 10 mL via INTRAVENOUS

## 2011-10-10 NOTE — Progress Notes (Signed)
ANTIBIOTIC CONSULT NOTE - FOLLOW UP  Pharmacy Consult for Vancomycin Indication: Bacteremia  Allergies  Allergen Reactions  . Morphine And Related Anaphylaxis  . Codeine Itching  . Lisinopril     Language changed    Patient Measurements: Height: 5\' 4"  (162.6 cm) Weight: 146 lb 9.7 oz (66.5 kg) IBW/kg (Calculated) : 54.7  Adjusted Body Weight:    Vital Signs: Temp: 98.2 F (36.8 C) (04/27 1002) Temp src: Oral (04/27 1002) BP: 85/53 mmHg (04/27 1002) Pulse Rate: 60  (04/27 1002) Intake/Output from previous day: 04/26 0701 - 04/27 0700 In: 1193 [P.O.:440; I.V.:253; Blood:350; IV Piggyback:150] Out: -  Intake/Output from this shift: Total I/O In: 50 [P.O.:50] Out: -   Labs:  Basename 10/10/11 1008 10/09/11 0555 10/08/11 0500  WBC 10.0 11.7* 18.3*  HGB 8.2* 7.8* 7.9*  PLT 37* 41* 59*  LABCREA -- -- --  CREATININE -- 4.69* 4.99*   Estimated Creatinine Clearance: 11.7 ml/min (by C-G formula based on Cr of 4.69).  Basename 10/10/11 0610  VANCOTROUGH --  Leodis Binet --  VANCORANDOM 30.0  GENTTROUGH --  GENTPEAK --  GENTRANDOM --  TOBRATROUGH --  TOBRAPEAK --  TOBRARND --  AMIKACINPEAK --  AMIKACINTROU --  AMIKACIN --     Microbiology: Recent Results (from the past 720 hour(s))  MRSA PCR SCREENING     Status: Normal   Collection Time   09/14/11  8:49 AM      Component Value Range Status Comment   MRSA by PCR NEGATIVE  NEGATIVE  Final   CULTURE, BLOOD (ROUTINE X 2)     Status: Normal   Collection Time   10/04/11  2:15 AM      Component Value Range Status Comment   Specimen Description BLOOD RIGHT FINGER   Final    Special Requests BOTTLES DRAWN AEROBIC ONLY [facility]   Final    Culture  Setup Time [id number]   Final    Culture     Final    Value: METHICILLIN RESISTANT STAPHYLOCOCCUS AUREUS     Note: RIFAMPIN AND GENTAMICIN SHOULD NOT BE USED AS SINGLE DRUGS FOR TREATMENT OF STAPH INFECTIONS. This organism DOES NOT demonstrate inducible Clindamycin resistance in  vitro. CRITICAL RESULT CALLED TO, READ BACK BY AND VERIFIED WITH: JENAE M @ 1430      10/06/11 BY KRAWS     Note: Gram Stain Report Called to,Read Back By and Verified With: Elsie Lincoln RN on 10/05/11 at 00:35 by Christie Nottingham   Report Status 10/07/2011 FINAL   Final    Organism ID, Bacteria METHICILLIN RESISTANT STAPHYLOCOCCUS AUREUS   Final   CULTURE, BLOOD (ROUTINE X 2)     Status: Normal   Collection Time   10/04/11  2:30 AM      Component Value Range Status Comment   Specimen Description BLOOD RIGHT HAND   Final    Special Requests BOTTLES DRAWN AEROBIC ONLY 5CC   Final    Culture  Setup Time [id number]   Final    Culture     Final    Value: STAPHYLOCOCCUS AUREUS     Note: SUSCEPTIBILITIES PERFORMED ON PREVIOUS CULTURE WITHIN THE LAST 5 DAYS.     Note: Gram Stain Report Called to,Read Back By and Verified With: Elsie Lincoln RN on 10/05/11 at 00:35 by Christie Nottingham   Report Status 10/07/2011 FINAL   Final   BODY FLUID CULTURE     Status: Normal   Collection Time   10/04/11  6:45 AM  Component Value Range Status Comment   Specimen Description PERITONEAL FLUID   Final    Special Requests 1 CUP   Final    Gram Stain     Final    Value: WBC PRESENT, PREDOMINANTLY PMN     NO ORGANISMS SEEN   Culture NO GROWTH 3 DAYS   Final    Report Status 10/07/2011 FINAL   Final   CULTURE, BLOOD (ROUTINE X 2)     Status: Normal (Preliminary result)   Collection Time   10/07/11  6:20 PM      Component Value Range Status Comment   Specimen Description BLOOD RIGHT HAND   Final    Special Requests BOTTLES DRAWN AEROBIC ONLY 1.5CC   Final    Culture  Setup Time [id number]   Final    Culture     Final    Value:        BLOOD CULTURE RECEIVED NO GROWTH TO DATE CULTURE WILL BE HELD FOR 5 DAYS BEFORE ISSUING A FINAL NEGATIVE REPORT   Report Status PENDING   Incomplete   CULTURE, BLOOD (ROUTINE X 2)     Status: Normal (Preliminary result)   Collection Time   10/07/11  6:32 PM      Component Value Range  Status Comment   Specimen Description BLOOD RIGHT ARM   Final    Special Requests BOTTLES DRAWN AEROBIC AND ANAEROBIC 10CC   Final    Culture  Setup Time [id number]   Final    Culture     Final    Value: YEAST     Note: Gram Stain Report Called to,Read Back By and Verified With: ANN MCCRAW @1155  10/09/11 BY KRAWS   Report Status PENDING   Incomplete   CULTURE, BLOOD (ROUTINE X 2)     Status: Normal (Preliminary result)   Collection Time   10/10/11  6:10 AM      Component Value Range Status Comment   Specimen Description BLOOD RIGHT HAND   Final    Special Requests BOTTLES DRAWN AEROBIC ONLY 10CC   Final    Culture PENDING   Incomplete    Report Status PENDING   Incomplete   CULTURE, BLOOD (ROUTINE X 2)     Status: Normal (Preliminary result)   Collection Time   10/10/11  6:15 AM      Component Value Range Status Comment   Specimen Description BLOOD LEFT ARM   Final    Special Requests BOTTLES DRAWN AEROBIC ONLY 10CC   Final    Culture PENDING   Incomplete    Report Status PENDING   Incomplete     Anti-infectives     Start     Dose/Rate Route Frequency Ordered Stop   10/09/11 1400   fluconazole (DIFLUCAN) tablet 100 mg  Status:  Discontinued        100 mg Oral Daily 10/09/11 1237 10/09/11 1312   10/09/11 1400   micafungin (MYCAMINE) 100 mg in sodium chloride 0.9 % 100 mL IVPB        100 mg 100 mL/hr over 1 Hours Intravenous Every 24 hours 10/09/11 1313     10/09/11 1300   cefTAZidime (FORTAZ) 500 mg in dextrose 5 % 50 mL IVPB        500 mg 100 mL/hr over 30 Minutes Intravenous Every 24 hours 10/09/11 0918     10/08/11 1300   cefTAZidime (FORTAZ) 1 g in dextrose 5 % 50 mL IVPB  Status:  Discontinued  1 g 100 mL/hr over 30 Minutes Intravenous Every 24 hours 10/08/11 1151 10/09/11 0917   10/07/11 1000   piperacillin-tazobactam (ZOSYN) IVPB 2.25 g  Status:  Discontinued        2.25 g 100 mL/hr over 30 Minutes Intravenous 3 times per day 10/07/11 0927 10/08/11 1145    10/07/11 0930   vancomycin (VANCOCIN) 1,750 mg in sodium chloride 0.9 % 500 mL IVPB        1,750 mg 250 mL/hr over 120 Minutes Intravenous  Once 10/07/11 0927 10/07/11 1241   10/05/11 0300   vancomycin (VANCOCIN) 1,750 mg in sodium chloride 0.9 % 500 mL IVPB        1,750 mg 250 mL/hr over 120 Minutes Intravenous  Once 10/05/11 0204 10/05/11 0500   10/04/11 0300   Ampicillin-Sulbactam (UNASYN) 3 g in sodium chloride 0.9 % 100 mL IVPB  Status:  Discontinued     Comments: Unasyn 3 g IV q24h on Peritoneal Dialysis      3 g 100 mL/hr over 60 Minutes Intravenous Daily at bedtime 10/04/11 0151 10/07/11 3086          Assessment: 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  Anticoagulation: SCDs. Platelets only 37.  Infectious Disease: Elita Quick #3/7 / Vanc #6/Micafungin #2 MRSA bacteremia, on peritoneal dialysis, not sure she is a good candidate for PD, may change to HD-pt has refused in the past. Tmax 99.5, WBC=10, seven days of Fortaz for diffuse airspace opacities. Vanco doses 4/22, 4/24,   1) Vancomycin level = 30, Repeat dose when Level <=20 2) Fortaz 1 Gram IV x 1 then 500 mg iv Q 24 hours  Cardiovascular: CAD, s/p CABG, VSS, ischemic cardiomyopathy=EF=20% on Ranexa  Endocrinology: DM, CBGs 144-174 on SSI and Lantus 5/day  Gastrointestinal / Nutrition: gallstone pancreatitis, chronic with pseudocysts, IV PPI  Nephrology: PD, Hgb=8.2  PTA Medication Issues: None  Best Practices: Scds.    Goal of Therapy:  <=20 for redosing  Plan:  Check Vanco level in about 3 days and redose if <=20.  Merilynn Finland, [employer] 10/10/2011,10:58 AM

## 2011-10-10 NOTE — Consult Note (Signed)
CC: Evaluate for fungal eye disease HPI: Patient with ESRD, CHF, encephalopathy has MRSA bacteremia and fungemia. She is being treated with micafungin. Consult was requested to evaluate for eye involvement. Patient is unable to answer questions about any eye complaints.  Past Medical History  Diagnosis Date  . Gallstone pancreatitis   . Coronary artery disease     s/p CABG 2008 with multiple PCIs  . Ischemic cardiomyopathy   . End stage renal disease   . Diabetes mellitus   . Hypertension   . History of nonadherence to medical treatment   . LBBB (left bundle branch block)   . Hyperlipidemia   . Renal insufficiency   . CHF (congestive heart failure)   . Dialysis patient   . Anemia     History   Social History  . Marital Status: Single    Spouse Name: N/A    Number of Children: N/A  . Years of Education: N/A   Occupational History  . Not on file.   Social History Main Topics  . Smoking status: Former Smoker    Quit date: 06/15/2000  . Smokeless tobacco: Not on file  . Alcohol Use: No  . Drug Use: No  . Sexually Active: Not on file   Other Topics Concern  . Not on file   Social History Narrative   The patient lives alone in [place].  She is a retired Engineer, civil (consulting).  She is disabled and receives most of her care from the [place], [place].      Family History  Problem Relation Age of Onset  . Coronary artery disease Mother   . Hypertension Mother   . Diabetes Mother   . Diabetes Sister   . Anesthesia problems Neg Hx     Allergies  Allergen Reactions  . Morphine And Related Anaphylaxis  . Codeine Itching  . Lisinopril     Language changed   Scheduled Meds:   . cefTAZidime (FORTAZ)  IV  500 mg Intravenous Q24H  . darbepoetin (ARANESP) injection - NON-DIALYSIS  40 mcg Subcutaneous Q Sun-1800  . feeding supplement  1 Container Oral TID BM  . insulin aspart  0-9 Units Subcutaneous TID WC  . insulin glargine  5 Units Subcutaneous Daily  . micafungin (MYCAMINE) IV   100 mg Intravenous Q24H  . multivitamin  1 tablet Oral QHS  . pantoprazole  40 mg Oral Q1200  . ranolazine  500 mg Oral Daily  . sevelamer  3,200 mg Oral TID WC  . simvastatin  10 mg Oral q1800  . sodium chloride  250 mL Intravenous Once  . sodium chloride  3 mL Intravenous Q12H  . DISCONTD: carvedilol  6.25 mg Oral BID WC  . DISCONTD: doxazosin  2 mg Oral QHS  . DISCONTD: fluconazole  100 mg Oral Daily  . DISCONTD: pantoprazole (PROTONIX) IV  40 mg Intravenous Q24H   Continuous Infusions:   . dialysis solution 1.5% low-MG/low-CA     PRN Meds:.acetaminophen, acetaminophen, heparin, HYDROmorphone, hydrOXYzine, ondansetron (ZOFRAN) IV, ondansetron, DISCONTD: HYDROmorphone, DISCONTD: zolpidem  ROS: Patient unable.  EXAM:  Patient unable to read near card to measure visual acuity. CVF, EOM unable IOP by tonopen (mm Hg) OD 15, OS 15.  Penlight exam: Lids - wnl OU Conj - quiet OU Cornea - clear OU AC formed OU Iris wnl OU Lens - Cataracts OU.  Dilated fundus exam: (tropicamide and phenylephrine instilled OU 12:30) 20 D lens: OU: Optic nerves pink and sharp. Maculae, retinal vessels and periphery normal,  vitreous appears clear. View is blurred from cataracts.   Imp/Plan: Bacteremia and fungemia without eye involvement.  Cataracts  Follow up as needed.

## 2011-10-10 NOTE — Progress Notes (Signed)
[facility] ROUNDING NOTE   Subjective:   Interval History: none.  Objective:  Vital signs in last 24 hours:  Temp:  [97.6 F (36.4 C)-99.5 F (37.5 C)] 98.2 F (36.8 C) (04/27 1002) Pulse Rate:  [60-99] 60  (04/27 1002) Resp:  [17-20] 18  (04/27 1002) BP: (85-128)/(53-82) 96/63 mmHg (04/27 1142) SpO2:  [91 %-100 %] 100 % (04/27 1002) Weight:  [66.5 kg (146 lb 9.7 oz)-72.3 kg (159 lb 6.3 oz)] 66.5 kg (146 lb 9.7 oz) (04/27 0500)  Weight change: 5.5 kg (12 lb 2 oz) Filed Weights   10/08/11 2300 10/09/11 2028 10/10/11 0500  Weight: 66.8 kg (147 lb 4.3 oz) 72.3 kg (159 lb 6.3 oz) 66.5 kg (146 lb 9.7 oz)    Intake/Output: I/O last 3 completed shifts: In: 1373 [P.O.:620; I.V.:253; Blood:350; IV Piggyback:150] Out: 0    Intake/Output this shift:  Total I/O In: 50 [P.O.:50] Out: -   CVS- RRR RS- CTA ABD- BS present soft non-distended EXT- no edema   Basic Metabolic Panel:  Lab 10/10/11 4098 10/09/11 0555 10/08/11 0500 10/07/11 0600 10/06/11 0630 10/05/11 0700  NA 129* 130* 132* 130* 132* --  K 3.7 4.5 4.9 3.6 3.2* --  CL 91* 93* 93* 90* 90* --  CO2 24 24 22 24 25  --  GLUCOSE 197* 181* 160* 193* 116* --  BUN 53* 59* 64* 67* 75* --  CREATININE 4.37* 4.69* 4.99* 5.43* 6.24* --  CALCIUM 9.0 9.2 9.5 -- -- --  MG -- -- -- -- -- --  PHOS -- -- -- -- -- 6.1*    Liver Function Tests:  Lab 10/10/11 1008 10/08/11 0500 10/07/11 0600 10/06/11 0630 10/05/11 0700 10/03/11 1851  AST 15 18 18  43* -- 19  ALT 14 24 35 56* -- 18  ALKPHOS 309* 317* 385* 369* -- 282*  BILITOT 0.3 0.5 0.4 0.4 -- 0.3  PROT 5.4* 5.7* 5.6* 5.3* -- 6.3  ALBUMIN 1.0* 1.2* 1.2* 1.3* 1.4* --    Lab 10/08/11 0500 10/05/11 0700 10/03/11 1851  LIPASE 15 10* 10*  AMYLASE 28 -- --    Lab 10/06/11 0630 10/05/11 1854  AMMONIA 53 54    CBC:  Lab 10/10/11 1008 10/09/11 0555 10/08/11 0500 10/07/11 0600 10/06/11 0630 10/04/11 0056 10/03/11 1851  WBC 10.0 11.7* 18.3* 18.1* 13.1* -- --    NEUTROABS -- -- -- 16.5* 11.9* 20.3* 22.3*  HGB 8.2* 7.8* 7.9* 8.1* 7.6* -- --  HCT 25.2* 24.1* 24.6* 24.8* 23.3* -- --  MCV 86.9 89.3 86.6 86.1 86.6 -- --  PLT 37* 41* 59* 58* 58* -- --    Cardiac Enzymes:  Lab 10/05/11 1843  CKTOTAL 10  CKMB 1.5  CKMBINDEX --  TROPONINI <0.30    BNP: No components found with this basename: POCBNP:5  CBG:  Lab 10/10/11 1223 10/10/11 0733 10/09/11 2201 10/09/11 1647 10/09/11 1121  GLUCAP 153* 170* 144* 150* 172*    Microbiology: Results for orders placed during the hospital encounter of 10/03/11  CULTURE, BLOOD (ROUTINE X 2)     Status: Normal   Collection Time   10/04/11  2:15 AM      Component Value Range Status Comment   Specimen Description BLOOD RIGHT FINGER   Final    Special Requests BOTTLES DRAWN AEROBIC ONLY [facility]   Final    Culture  Setup Time [id number]   Final    Culture     Final    Value: METHICILLIN RESISTANT STAPHYLOCOCCUS AUREUS  Note: RIFAMPIN AND GENTAMICIN SHOULD NOT BE USED AS SINGLE DRUGS FOR TREATMENT OF STAPH INFECTIONS. This organism DOES NOT demonstrate inducible Clindamycin resistance in vitro. CRITICAL RESULT CALLED TO, READ BACK BY AND VERIFIED WITH: JENAE M @ 1430      10/06/11 BY KRAWS     Note: Gram Stain Report Called to,Read Back By and Verified With: Elsie Lincoln RN on 10/05/11 at 00:35 by Christie Nottingham   Report Status 10/07/2011 FINAL   Final    Organism ID, Bacteria METHICILLIN RESISTANT STAPHYLOCOCCUS AUREUS   Final   CULTURE, BLOOD (ROUTINE X 2)     Status: Normal   Collection Time   10/04/11  2:30 AM      Component Value Range Status Comment   Specimen Description BLOOD RIGHT HAND   Final    Special Requests BOTTLES DRAWN AEROBIC ONLY 5CC   Final    Culture  Setup Time [id number]   Final    Culture     Final    Value: STAPHYLOCOCCUS AUREUS     Note: SUSCEPTIBILITIES PERFORMED ON PREVIOUS CULTURE WITHIN THE LAST 5 DAYS.     Note: Gram Stain Report Called to,Read Back By and Verified With: Elsie Lincoln RN on 10/05/11 at 00:35 by Christie Nottingham   Report Status 10/07/2011 FINAL   Final   BODY FLUID CULTURE     Status: Normal   Collection Time   10/04/11  6:45 AM      Component Value Range Status Comment   Specimen Description PERITONEAL FLUID   Final    Special Requests 1 CUP   Final    Gram Stain     Final    Value: WBC PRESENT, PREDOMINANTLY PMN     NO ORGANISMS SEEN   Culture NO GROWTH 3 DAYS   Final    Report Status 10/07/2011 FINAL   Final   CULTURE, BLOOD (ROUTINE X 2)     Status: Normal (Preliminary result)   Collection Time   10/07/11  6:20 PM      Component Value Range Status Comment   Specimen Description BLOOD RIGHT HAND   Final    Special Requests BOTTLES DRAWN AEROBIC ONLY 1.5CC   Final    Culture  Setup Time [id number]   Final    Culture     Final    Value:        BLOOD CULTURE RECEIVED NO GROWTH TO DATE CULTURE WILL BE HELD FOR 5 DAYS BEFORE ISSUING A FINAL NEGATIVE REPORT   Report Status PENDING   Incomplete   CULTURE, BLOOD (ROUTINE X 2)     Status: Normal (Preliminary result)   Collection Time   10/07/11  6:32 PM      Component Value Range Status Comment   Specimen Description BLOOD RIGHT ARM   Final    Special Requests BOTTLES DRAWN AEROBIC AND ANAEROBIC 10CC   Final    Culture  Setup Time [id number]   Final    Culture     Final    Value: YEAST     Note: Gram Stain Report Called to,Read Back By and Verified With: ANN MCCRAW @1155  10/09/11 BY KRAWS   Report Status PENDING   Incomplete   CULTURE, BLOOD (ROUTINE X 2)     Status: Normal (Preliminary result)   Collection Time   10/10/11  6:10 AM      Component Value Range Status Comment   Specimen Description BLOOD RIGHT HAND   Final  Special Requests BOTTLES DRAWN AEROBIC ONLY 10CC   Final    Culture PENDING   Incomplete    Report Status PENDING   Incomplete   CULTURE, BLOOD (ROUTINE X 2)     Status: Normal (Preliminary result)   Collection Time   10/10/11  6:15 AM      Component Value Range Status  Comment   Specimen Description BLOOD LEFT ARM   Final    Special Requests BOTTLES DRAWN AEROBIC ONLY 10CC   Final    Culture PENDING   Incomplete    Report Status PENDING   Incomplete     Coagulation Studies: No results found for this basename: LABPROT:5,INR:5 in the last 72 hours  Urinalysis: No results found for this basename: COLORURINE:2,APPERANCEUR:2,LABSPEC:2,PHURINE:2,GLUCOSEU:2,HGBUR:2,BILIRUBINUR:2,KETONESUR:2,PROTEINUR:2,UROBILINOGEN:2,NITRITE:2,LEUKOCYTESUR:2 in the last 72 hours    Imaging: No results found.   Medications:      . dialysis solution 1.5% low-MG/low-CA        . cefTAZidime (FORTAZ)  IV  500 mg Intravenous Q24H  . darbepoetin (ARANESP) injection - NON-DIALYSIS  40 mcg Subcutaneous Q Sun-1800  . feeding supplement  1 Container Oral TID BM  . insulin aspart  0-9 Units Subcutaneous TID WC  . insulin glargine  5 Units Subcutaneous Daily  . micafungin (MYCAMINE) IV  100 mg Intravenous Q24H  . multivitamin  1 tablet Oral QHS  . pantoprazole  40 mg Oral Q1200  . ranolazine  500 mg Oral Daily  . sevelamer  3,200 mg Oral TID WC  . simvastatin  10 mg Oral q1800  . sodium chloride  250 mL Intravenous Once  . sodium chloride  3 mL Intravenous Q12H  . DISCONTD: carvedilol  6.25 mg Oral BID WC  . DISCONTD: doxazosin  2 mg Oral QHS  . DISCONTD: fluconazole  100 mg Oral Daily  . DISCONTD: pantoprazole (PROTONIX) IV  40 mg Intravenous Q24H   acetaminophen, acetaminophen, heparin, HYDROmorphone, hydrOXYzine, ondansetron (ZOFRAN) IV, ondansetron, DISCONTD: HYDROmorphone, DISCONTD: zolpidem  Assessment/ Plan:  ESRD- CAPD 1.5% bags blood pressure a little low  HTN controlled  Anemia stable trending down  Abdominal pain PD may be aggravating. Quite low albumin.  MRSA bacteremia. Source unknown awaiting TEE Candidaemia will need evaluation of pseudocyst possible abscess, opthalmology  Consulted mycofungin started Neuro confused at times  Consider NGT    Discussed with her daughter. Patient has voiced a refusal to want hemodialysis in the past.  We will continue PD for now      LOS: 7 Lylla Eifler W @TODAY @12 :45 PM

## 2011-10-10 NOTE — Progress Notes (Addendum)
Appreciate cardiology, renal follow up appreciated. Discussed CT abdomen findings with Dr Bonnielee Haff and Dr Carolynne Edouard- perforated bowel, septic thrombophlebitis.Spoke with Dr Burgess Estelle, who will graciously see patient for eye exam. Also updated daughter [name] over the phone. Patient not in a position to understand the gravity of her condition due to confusion. I suspect she might have septic emboli in her brain, contributing to the confusion. Unfortunately, she cannot get MRI due to pace maker. Will keep patient NPO and follow up with surgery.  Dorothy Subramanian,MD pager#[phone].

## 2011-10-10 NOTE — Progress Notes (Signed)
Md was made aware of lab stating blood culture growing out gram negative rods.

## 2011-10-10 NOTE — Progress Notes (Signed)
TEE requested secondary to MRSA and fungal blood cultures at the request of Dr. Venetia Constable.   Dr. Mayford Knife is patient's cardiologist.   I discussed TEE with her and she overall appeared confused. She does not have SOB, no CP, no dysphagia.   She should be able to tolerate procedure.   Cardiomyopathy known.   Spoke to [place], daughter on phone [phone] about TEE risks and benefits. OK with proceeding. She will need formal consent done by nursing.   Make NPO on Sunday for TEE on Monday.  Will discuss with endoscopy./Dr. Mayford Knife

## 2011-10-10 NOTE — Progress Notes (Signed)
[name] is a 61 y.o. female patient.  SUBJECTIVE "Feel ok"   1. Pancreatic pseudocyst   2. Epigastric abdominal pain   3. End stage renal disease     Past Medical History  Diagnosis Date  . Gallstone pancreatitis   . Coronary artery disease     s/p CABG 2008 with multiple PCIs  . Ischemic cardiomyopathy   . End stage renal disease   . Diabetes mellitus   . Hypertension   . History of nonadherence to medical treatment   . LBBB (left bundle branch block)   . Hyperlipidemia   . Renal insufficiency   . CHF (congestive heart failure)   . Dialysis patient   . Anemia    Current Facility-Administered Medications  Medication Dose Route Frequency Provider Last Rate Last Dose  . acetaminophen (TYLENOL) tablet 650 mg  650 mg Oral Q6H PRN Ron Parker, MD   650 mg at 10/10/11 0348   Or  . acetaminophen (TYLENOL) suppository 650 mg  650 mg Rectal Q6H PRN Ron Parker, MD      . cefTAZidime (FORTAZ) 500 mg in dextrose 5 % 50 mL IVPB  500 mg Intravenous Q24H Daymeon Fischman, MD   500 mg at 10/09/11 1417  . darbepoetin (ARANESP) injection 40 mcg  40 mcg Subcutaneous Q Sun-1800 Sheffield Slider, PA   40 mcg at 10/04/11 1704  . dialysis solution 1.5% low-MG/low-CA (DELFLEX) CAPD   Intraperitoneal Continuous Garnetta Buddy, MD      . feeding supplement (RESOURCE BREEZE) liquid 1 Container  1 Container Oral TID BM Sheffield Slider, PA   1 Container at 10/09/11 2157  . heparin injection 500 Units  500 Units Intraperitoneal PRN Zada Girt, MD   500 Units at 10/08/11 [phone]  . HYDROmorphone (DILAUDID) injection 0.5 mg  0.5 mg Intravenous Q4H PRN Paije Goodhart, MD      . hydrOXYzine (ATARAX/VISTARIL) tablet 10 mg  10 mg Oral Q12H PRN Clanford L Johnson, MD      . insulin aspart (novoLOG) injection 0-9 Units  0-9 Units Subcutaneous TID WC Clanford Cyndie Mull, MD   2 Units at 10/10/11 [phone]  . insulin glargine (LANTUS) injection 5 Units  5 Units Subcutaneous Daily Clanford Cyndie Mull,  MD   5 Units at 10/09/11 1025  . micafungin (MYCAMINE) 100 mg in sodium chloride 0.9 % 100 mL IVPB  100 mg Intravenous Q24H Randall Hiss, MD   100 mg at 10/09/11 1735  . multivitamin (RENA-VIT) tablet 1 tablet  1 tablet Oral QHS Rhetta Mura, MD   1 tablet at 10/09/11 2158  . ondansetron (ZOFRAN) tablet 4 mg  4 mg Oral Q6H PRN Ron Parker, MD       Or  . ondansetron (ZOFRAN) injection 4 mg  4 mg Intravenous Q6H PRN Ron Parker, MD   4 mg at 10/04/11 0415  . pantoprazole (PROTONIX) injection 40 mg  40 mg Intravenous Q24H Ron Parker, MD   40 mg at 10/09/11 0015  . ranolazine (RANEXA) 12 hr tablet 500 mg  500 mg Oral Daily Ron Parker, MD   500 mg at 10/09/11 1025  . sevelamer (RENVELA) tablet 3,200 mg  3,200 mg Oral TID WC Khary Schaben, MD   3,200 mg at 10/10/11 4782  . simvastatin (ZOCOR) tablet 10 mg  10 mg Oral q1800 Ron Parker, MD   10 mg at 10/09/11 1738  . sodium chloride 0.9 % bolus  250 mL  250 mL Intravenous Once Jerzie Bieri, MD   250 mL at 10/09/11 1611  . sodium chloride 0.9 % injection 3 mL  3 mL Intravenous Q12H Ron Parker, MD   3 mL at 10/09/11 2158  . DISCONTD: carvedilol (COREG) tablet 6.25 mg  6.25 mg Oral BID WC Ron Parker, MD   6.25 mg at 10/09/11 0809  . DISCONTD: doxazosin (CARDURA) tablet 2 mg  2 mg Oral QHS Ron Parker, MD   2 mg at 10/07/11 2105  . DISCONTD: fluconazole (DIFLUCAN) tablet 100 mg  100 mg Oral Daily Damonica Chopra, MD      . DISCONTD: HYDROmorphone (DILAUDID) injection 2 mg  2 mg Intravenous Q3H PRN Ron Parker, MD   2 mg at 10/10/11 0836  . DISCONTD: zolpidem (AMBIEN) tablet 5 mg  5 mg Oral QHS PRN Ron Parker, MD       Allergies  Allergen Reactions  . Morphine And Related Anaphylaxis  . Codeine Itching  . Lisinopril     Language changed   Principal Problem:  *Epigastric abdominal pain Active Problems:  Ischemic cardiomyopathy  Chronic systolic congestive heart  failure  End stage renal disease  Pancreatic pseudocyst  Pancreatitis chronic  Hyperkalemia  Hyponatremia   Vital signs in last 24 hours: Temp:  [97.6 F (36.4 C)-99.5 F (37.5 C)] 98.2 F (36.8 C) (04/27 1002) Pulse Rate:  [60-99] 60  (04/27 1002) Resp:  [17-20] 18  (04/27 1002) BP: (85-128)/(53-82) 85/53 mmHg (04/27 1002) SpO2:  [91 %-100 %] 100 % (04/27 1002) Weight:  [66.5 kg (146 lb 9.7 oz)-72.3 kg (159 lb 6.3 oz)] 66.5 kg (146 lb 9.7 oz) (04/27 0500) Weight change: 5.5 kg (12 lb 2 oz) Last BM Date: 10/09/11  Intake/Output from previous day: 04/26 0701 - 04/27 0700 In: 1193 [P.O.:440; I.V.:253; Blood:350; IV Piggyback:150] Out: -  Intake/Output this shift: Total I/O In: 50 [P.O.:50] Out: -   Lab Results:  Basename 10/09/11 0555 10/08/11 0500  WBC 11.7* 18.3*  HGB 7.8* 7.9*  HCT 24.1* 24.6*  PLT 41* 59*   BMET  Basename 10/09/11 0555 10/08/11 0500  NA 130* 132*  K 4.5 4.9  CL 93* 93*  CO2 24 22  GLUCOSE 181* 160*  BUN 59* 64*  CREATININE 4.69* 4.99*  CALCIUM 9.2 9.5    Studies/Results: No results found.  Medications: I have reviewed the patient's current medications.   Physical exam GENERAL- alert, awake but in and out. Just received 2mg  of dilaudid. HEAD- normal atraumatic, no neck masses, normal thyroid, no jvd RESPIRATORY- appears well, vitals normal, no respiratory distress, acyanotic, normal RR, ear and throat exam is normal, neck free of mass or lymphadenopathy, chest clear, no wheezing, crepitations, rhonchi, normal symmetric air entry CVS- regular rate and rhythm, S1, S2 normal, no murmur, click, rub or gallop ABDOMEN- Pd cath in place, abdomen less tender. +BS.NEURO- in and out of it but responds to questions. EXTREMITIES- extremities normal, atraumatic, no cyanosis or edema  Plan  * MRSA bacteremia/Fungemia- Appreciate ID. On micafungin. Have consulted cardiology for TEE. Dr Anne Fu will graciously see patient later today. Also left  message with Dr Tanner(ophthalmology). Continue mx per ID. * Ischemic cardiomyopathy/ Chronic systolic congestive heart failure(20%)- stable.  * End stage renal disease-on PD. Appreciate renal.  * Pancreatic pseudocyst/ Pancreatitis chronic- tolerating feeding, refusing ngt. ?drain pseudocyst  * Anemia of chronic kidney disease- some drop in hb, no overt bleeding. Will give unit of blood in  view of ongoing fungemia.*Thrombocytopenia- may be related to ongoing infection. Monitor.  * encephalopathy- likely related to infection. Discussed with patient's daughter yesterday. She says family would like everything possible to be done.  Dvt/gi prophylaxis.     Cristofer Yaffe 10/10/2011 10:21 AM Pager: [phone].

## 2011-10-10 NOTE — Consult Note (Signed)
Reason for Consult:abd pain Referring Physician: Dr. Otelia Limes is an 61 y.o. female.  HPI: 61 yo bf who has actually been sick for some time. She has had hemorrhagic pancreatitis in the recent past. She is now in the hospital with fevers and positive blood cultures. She has had abd pain but it seems to be getting better. Her wbc has been elevated but is now down to normal. Her BP is better than it has been in days. She is confused and is unable to give a very good history. She had a CT done today which shows contained leak of oral contrast from back of stomach into the lesser sac but not a big fluid collection. It also shows occluding clot in portal venous and splenic veins with evidence of infection of clot. She has continued to get peritoneal dialysis and there has been no evidence that that fluid has looked infected or bilious.  Past Medical History  Diagnosis Date  . Gallstone pancreatitis   . Coronary artery disease     [name]/p CABG 2008 with multiple PCIs  . Ischemic cardiomyopathy   . End stage renal disease   . Diabetes mellitus   . Hypertension   . History of nonadherence to medical treatment   . LBBB (left bundle branch block)   . Hyperlipidemia   . Renal insufficiency   . CHF (congestive heart failure)   . Dialysis patient   . Anemia     Past Surgical History  Procedure Date  . Coronary artery bypass graft 2008  . Cholecystectomy   . Abdominal hysterectomy   . Tubal ligation   . Tonsillectomy   . Insert / replace / remove pacemaker     pacemaker ICD  . Esophagogastroduodenoscopy 09/15/2011    Procedure: ESOPHAGOGASTRODUODENOSCOPY (EGD);  Surgeon: Theda Belfast, MD;  Location: [facility] ENDOSCOPY;  Service: Endoscopy;  Laterality: N/A;    Family History  Problem Relation Age of Onset  . Coronary artery disease Mother   . Hypertension Mother   . Diabetes Mother   . Diabetes Sister   . Anesthesia problems Neg Hx     Social History:  reports that she quit smoking  about 11 years ago. She does not have any smokeless tobacco history on file. She reports that she does not drink alcohol or use illicit drugs.  Allergies:  Allergies  Allergen Reactions  . Morphine And Related Anaphylaxis  . Codeine Itching  . Lisinopril     Language changed    Medications: I have reviewed the patient'[name] current medications.  Results for orders placed during the hospital encounter of 10/03/11 (from the past 48 hour([name]))  GLUCOSE, CAPILLARY     Status: Abnormal   Collection Time   10/08/11 10:11 PM      Component Value Range Comment   Glucose-Capillary 190 (*) 70 - 99 (mg/dL)   CBC     Status: Abnormal   Collection Time   10/09/11  5:55 AM      Component Value Range Comment   WBC 11.7 (*) 4.0 - 10.5 (K/uL)    RBC 2.70 (*) 3.87 - 5.11 (MIL/uL)    Hemoglobin 7.8 (*) 12.0 - 15.0 (g/dL)    HCT 40.9 (*) 81.1 - 46.0 (%)    MCV 89.3  78.0 - 100.0 (fL)    MCH 28.9  26.0 - 34.0 (pg)    MCHC 32.4  30.0 - 36.0 (g/dL)    RDW 91.4 (*) 78.2 - 15.5 (%)  Platelets 41 (*) 150 - 400 (K/uL) CONSISTENT WITH PREVIOUS RESULT  BASIC METABOLIC PANEL     Status: Abnormal   Collection Time   10/09/11  5:55 AM      Component Value Range Comment   Sodium 130 (*) 135 - 145 (mEq/L)    Potassium 4.5  3.5 - 5.1 (mEq/L)    Chloride 93 (*) 96 - 112 (mEq/L)    CO2 24  19 - 32 (mEq/L)    Glucose, Bld 181 (*) 70 - 99 (mg/dL)    BUN 59 (*) 6 - 23 (mg/dL)    Creatinine, Ser 1.61 (*) 0.50 - 1.10 (mg/dL)    Calcium 9.2  8.4 - 10.5 (mg/dL)    GFR calc non Af Amer 9 (*) >90 (mL/min)    GFR calc Af Amer 11 (*) >90 (mL/min)   GLUCOSE, CAPILLARY     Status: Abnormal   Collection Time   10/09/11  7:21 AM      Component Value Range Comment   Glucose-Capillary 174 (*) 70 - 99 (mg/dL)   GLUCOSE, CAPILLARY     Status: Abnormal   Collection Time   10/09/11 11:21 AM      Component Value Range Comment   Glucose-Capillary 172 (*) 70 - 99 (mg/dL)   TYPE AND SCREEN     Status: Normal   Collection Time    10/09/11  4:30 PM      Component Value Range Comment   ABO/RH(D) O POS      Antibody Screen POS      Sample Expiration 10/12/2011      Antibody Identification ANTI-K      DAT, IgG POS      Antibody ID,T [place] NO SPECIFIC ANTIBODY DEMONSTRATED IN EL[place]      Unit Number [id number]      Blood Component Type RED CELLS,LR      Unit division 00      Status of Unit ISSUED,FINAL      Donor AG Type NEGATIVE FOR KELL ANTIGEN      Transfusion Status OK TO TRANSFUSE      Crossmatch Result COMPATIBLE     PREPARE RBC (CROSSMATCH)     Status: Normal   Collection Time   10/09/11  4:30 PM      Component Value Range Comment   Order Confirmation ORDER PROCESSED BY BLOOD BANK     GLUCOSE, CAPILLARY     Status: Abnormal   Collection Time   10/09/11  4:47 PM      Component Value Range Comment   Glucose-Capillary 150 (*) 70 - 99 (mg/dL)   GLUCOSE, CAPILLARY     Status: Abnormal   Collection Time   10/09/11 10:01 PM      Component Value Range Comment   Glucose-Capillary 144 (*) 70 - 99 (mg/dL)    Comment 1 Notify RN      Comment 2 Documented in Chart     VANCOMYCIN, RANDOM     Status: Normal   Collection Time   10/10/11  6:10 AM      Component Value Range Comment   Vancomycin Rm 30.0     CULTURE, BLOOD (ROUTINE X 2)     Status: Normal (Preliminary result)   Collection Time   10/10/11  6:10 AM      Component Value Range Comment   Specimen Description BLOOD RIGHT HAND      Special Requests BOTTLES DRAWN AEROBIC ONLY 10CC      Culture PENDING  Report Status PENDING     CULTURE, BLOOD (ROUTINE X 2)     Status: Normal (Preliminary result)   Collection Time   10/10/11  6:15 AM      Component Value Range Comment   Specimen Description BLOOD LEFT ARM      Special Requests BOTTLES DRAWN AEROBIC ONLY 10CC      Culture PENDING      Report Status PENDING     GLUCOSE, CAPILLARY     Status: Abnormal   Collection Time   10/10/11  7:33 AM      Component Value Range Comment   Glucose-Capillary 170 (*)  70 - 99 (mg/dL)   CBC     Status: Abnormal   Collection Time   10/10/11 10:08 AM      Component Value Range Comment   WBC 10.0  4.0 - 10.5 (K/uL)    RBC 2.90 (*) 3.87 - 5.11 (MIL/uL)    Hemoglobin 8.2 (*) 12.0 - 15.0 (g/dL)    HCT 16.1 (*) 09.6 - 46.0 (%)    MCV 86.9  78.0 - 100.0 (fL)    MCH 28.3  26.0 - 34.0 (pg)    MCHC 32.5  30.0 - 36.0 (g/dL)    RDW 04.5 (*) 40.9 - 15.5 (%)    Platelets 37 (*) 150 - 400 (K/uL) CONSISTENT WITH PREVIOUS RESULT  COMPREHENSIVE METABOLIC PANEL     Status: Abnormal   Collection Time   10/10/11 10:08 AM      Component Value Range Comment   Sodium 129 (*) 135 - 145 (mEq/L)    Potassium 3.7  3.5 - 5.1 (mEq/L)    Chloride 91 (*) 96 - 112 (mEq/L)    CO2 24  19 - 32 (mEq/L)    Glucose, Bld 197 (*) 70 - 99 (mg/dL)    BUN 53 (*) 6 - 23 (mg/dL)    Creatinine, Ser 8.11 (*) 0.50 - 1.10 (mg/dL)    Calcium 9.0  8.4 - 10.5 (mg/dL)    Total Protein 5.4 (*) 6.0 - 8.3 (g/dL)    Albumin 1.0 (*) 3.5 - 5.2 (g/dL)    AST 15  0 - 37 (U/L)    ALT 14  0 - 35 (U/L)    Alkaline Phosphatase 309 (*) 39 - 117 (U/L)    Total Bilirubin 0.3  0.3 - 1.2 (mg/dL)    GFR calc non Af Amer 10 (*) >90 (mL/min)    GFR calc Af Amer 12 (*) >90 (mL/min)   GLUCOSE, CAPILLARY     Status: Abnormal   Collection Time   10/10/11 12:23 PM      Component Value Range Comment   Glucose-Capillary 153 (*) 70 - 99 (mg/dL)   GLUCOSE, CAPILLARY     Status: Abnormal   Collection Time   10/10/11  5:42 PM      Component Value Range Comment   Glucose-Capillary 107 (*) 70 - 99 (mg/dL)     Ct Abdomen Pelvis W Contrast  10/10/2011  *RADIOLOGY REPORT*  Clinical Data: Evaluate pseudocyst  CT ABDOMEN AND PELVIS WITH CONTRAST  Technique:  Multidetector CT imaging of the abdomen and pelvis was performed following the standard protocol during bolus administration of intravenous contrast.  Contrast: OMNIPAQUE IOHEXOL 300 MG/ML  SOLN  Comparison: 10/03/2011  Findings: Inflammatory changes about the head of  the pancreas as well as edema throughout the peritoneal fat has markedly increased. The portal vein is now thrombosed.  Splenic vein is thrombosed. Portal venous  branches throughout the liver are thrombosed. Portal venous gas in the left lobe is noted on image 24 of series 2. There is a small amount of gas in the portal vein at its juncture with the splenic and superior mesenteric veins.  See image 13 of series 5.  On image 35, there is a collection of contrast that extends from the lumen into the lesser sac.  This is worrisome for gastric perforation and spillage of contrast into the lesser sac.  There is a complex mass in the tail of the pancreas previously thought to represent a hemorrhagic pseudocyst that is not significantly changed. It is characterized by a curvilinear areas of enhancement with low density.  Some of these areas of low density are likely cystic and others may represent thrombosed splenic vein. There are gas bubbles within this complex mass which may represent gas in the splenic vein.  There is some pancreatic parenchyma that is enhancing about the thrombosed splenic vein.  Chronic parenchyma atrophy is noted.  The pancreatic head is heterogeneous with areas of low density mixed with enhancing parenchyma.  This finding is felt to represent borderline necrosis.  1.4 x 2.9 cm soft tissue density anterior to the celiac axis may represent an enlarged node.  Other borderline enlarged left periaortic nodes are seen in the abdomen.  Moderate ascites surrounding the spleen and liver.  Post cholecystectomy.  Stable appearance of the kidneys and adrenal glands.  Large amount of free fluid in the pelvis.  Bladder, uterus, and adnexa are within normal limits.  Bibasilar atelectasis verses airspace disease. Small pleural effusions.  Severe cardiomegaly.  Peritoneal dialysis catheter in the left lower quadrant is stable.  IMPRESSION: Gastric perforation with spillage of oral contrast into the lesser sac.   Portal and splenic veins are thrombosed.  There is gas within the thrombus worrisome for septic thrombophlebitis.  Portal venous gas in the liver is also present.  Increased pancreatic inflammatory change as well as edema within the peritoneal fat.  Increased ascites  An element of pancreatic necrosis is suspected given the poor enhancement the pancreas.  Large mass in the tail of the pancreas of unknown significance.  It is heterogeneous and likely contains thrombosed splenic vein with gas.  It is probably an inflammatory mass at this point.  Mild abnormal adenopathy as described.  Bibasilar atelectasis worse airspace disease and small bilateral effusions.  Critical Value/emergent results were called by telephone at the time of interpretation on 10/10/2011  at 1750 hours  to  Dr. Venetia Constable, who verbally acknowledged these results.  Original Report Authenticated By: Donavan Burnet, M.D.    Review of Systems  Constitutional: Positive for fever and malaise/fatigue.  HENT: Negative.   Eyes: Negative.   Respiratory: Negative.   Cardiovascular: Negative.   Gastrointestinal: Positive for abdominal pain. Negative for vomiting.  Genitourinary: Negative.   Musculoskeletal: Negative.   Skin: Negative.   Neurological: Negative.   Endo/Heme/Allergies: Negative.   Psychiatric/Behavioral: Positive for memory loss.   Blood pressure 124/84, pulse 95, temperature 97.4 F (36.3 C), temperature source Oral, resp. rate 17, height 5\' 4"  (1.626 m), weight 146 lb 9.7 oz (66.5 kg), SpO2 98.00%. Physical Exam  Constitutional: She appears well-developed and well-nourished.  HENT:  Head: Normocephalic and atraumatic.  Eyes: Conjunctivae and EOM are normal. Pupils are equal, round, and reactive to light.  Neck: Normal range of motion. Neck supple.  GI: Soft. Bowel sounds are normal.       Diffuse tenderness but no  guarding or rigidity. Good bs. Normal bm'[name]  Musculoskeletal: Normal range of motion.  Neurological: She is  alert.       confused  Skin: Skin is warm and dry.    Assessment/Plan: The pt has a very serious and life threatening condition with evidence of septic thrombophlebitis of portal venous system and contained leak from posterior stomach. This may have been going on for a week or more. Amazingly she seems to be getting better. Given this fact and her significant comorbid conditions including ischemic cardiomyopathy, severe thrombocytopenia,and malnutrition I would favor close monitoring and continued abx. She will probably also need to be on heparin for her clot although she doesn't have a lot of working platelets. I would place an NG for bowel rest. I would also recommend a critical care consult. We will follow  [name],[name] 10/10/2011, 8:40 PM

## 2011-10-11 LAB — COMPREHENSIVE METABOLIC PANEL
Albumin: 1.1 g/dL — ABNORMAL LOW (ref 3.5–5.2)
BUN: 53 mg/dL — ABNORMAL HIGH (ref 6–23)
Calcium: 9.1 mg/dL (ref 8.4–10.5)
Chloride: 91 mEq/L — ABNORMAL LOW (ref 96–112)
Creatinine, Ser: 4.47 mg/dL — ABNORMAL HIGH (ref 0.50–1.10)
GFR calc non Af Amer: 10 mL/min — ABNORMAL LOW (ref >90)
Total Bilirubin: 0.4 mg/dL (ref 0.3–1.2)

## 2011-10-11 LAB — GLUCOSE, CAPILLARY
Glucose-Capillary: 115 mg/dL — ABNORMAL HIGH (ref 70–99)
Glucose-Capillary: 136 mg/dL — ABNORMAL HIGH (ref 70–99)
Glucose-Capillary: 147 mg/dL — ABNORMAL HIGH (ref 70–99)

## 2011-10-11 LAB — CBC
HCT: 26.5 % — ABNORMAL LOW (ref 36.0–46.0)
MCH: 28.5 pg (ref 26.0–34.0)
MCV: 85.8 fL (ref 78.0–100.0)
RDW: 18.7 % — ABNORMAL HIGH (ref 11.5–15.5)
WBC: 13 10*3/uL — ABNORMAL HIGH (ref 4.0–10.5)

## 2011-10-11 MED ORDER — SODIUM CHLORIDE 0.9 % IV SOLN
500.0000 mg | INTRAVENOUS | Status: DC
  Administered 2011-10-11: 500 mg via INTRAVENOUS
  Filled 2011-10-11 (×2): qty 0.5

## 2011-10-11 MED ORDER — DARBEPOETIN ALFA-POLYSORBATE 40 MCG/0.4ML IJ SOLN
INTRAMUSCULAR | Status: AC
  Administered 2011-10-11: 40 ug via SUBCUTANEOUS
  Filled 2011-10-11: qty 0.4

## 2011-10-11 NOTE — Progress Notes (Signed)
[facility] ROUNDING NOTE   Subjective:   Interval History: has complaints pain constant abdominal and epigastric  Objective:  Vital signs in last 24 hours:  Temp:  [97.4 F (36.3 C)-98.9 F (37.2 C)] 98.8 F (37.1 C) (04/28 0525) Pulse Rate:  [60-95] 94  (04/28 0525) Resp:  [16-18] 16  (04/28 0525) BP: (85-124)/(53-84) 115/74 mmHg (04/28 0525) SpO2:  [95 %-100 %] 98 % (04/28 0525) Weight:  [69 kg (152 lb 1.9 oz)] 69 kg (152 lb 1.9 oz) (04/27 2052)  Weight change: -3.3 kg (-7 lb 4.4 oz) Filed Weights   10/09/11 2028 10/10/11 0500 10/10/11 2052  Weight: 72.3 kg (159 lb 6.3 oz) 66.5 kg (146 lb 9.7 oz) 69 kg (152 lb 1.9 oz)    Intake/Output: I/O last 3 completed shifts: In: 830 [P.O.:230; I.V.:250; Blood:350] Out: -    Intake/Output this shift:      HEENT: Normal. Pupils equal.  CVS RRR Lungs: Clear.  Abdomen: Decreased BS. No rebound or peritoneal signs.  Neurologic: Grossly intact to motor and sensory function.  No edema    Basic Metabolic Panel:  Lab 10/11/11 2130 10/10/11 1008 10/09/11 0555 10/08/11 0500 10/07/11 0600 10/05/11 0700  NA 129* 129* 130* 132* 130* --  K 3.9 3.7 4.5 4.9 3.6 --  CL 91* 91* 93* 93* 90* --  CO2 24 24 24 22 24  --  GLUCOSE 76 197* 181* 160* 193* --  BUN 53* 53* 59* 64* 67* --  CREATININE 4.47* 4.37* 4.69* 4.99* 5.43* --  CALCIUM 9.1 9.0 9.2 -- -- --  MG -- -- -- -- -- --  PHOS -- -- -- -- -- 6.1*    Liver Function Tests:  Lab 10/11/11 0700 10/10/11 1008 10/08/11 0500 10/07/11 0600 10/06/11 0630  AST 17 15 18 18  43*  ALT 14 14 24  35 56*  ALKPHOS 235* 309* 317* 385* 369*  BILITOT 0.4 0.3 0.5 0.4 0.4  PROT 5.9* 5.4* 5.7* 5.6* 5.3*  ALBUMIN 1.1* 1.0* 1.2* 1.2* 1.3*    Lab 10/08/11 0500 10/05/11 0700  LIPASE 15 10*  AMYLASE 28 --    Lab 10/06/11 0630 10/05/11 1854  AMMONIA 53 54    CBC:  Lab 10/11/11 0700 10/10/11 1008 10/09/11 0555 10/08/11 0500 10/07/11 0600 10/06/11 0630  WBC 13.0* 10.0 11.7* 18.3*  18.1* --  NEUTROABS -- -- -- -- 16.5* 11.9*  HGB 8.8* 8.2* 7.8* 7.9* 8.1* --  HCT 26.5* 25.2* 24.1* 24.6* 24.8* --  MCV 85.8 86.9 89.3 86.6 86.1 --  PLT 48* 37* 41* 59* 58* --    Cardiac Enzymes:  Lab 10/05/11 1843  CKTOTAL 10  CKMB 1.5  CKMBINDEX --  TROPONINI <0.30    BNP: No components found with this basename: POCBNP:5  CBG:  Lab 10/11/11 0754 10/10/11 2050 10/10/11 1742 10/10/11 1223 10/10/11 0733  GLUCAP 102* 87 107* 153* 170*    Microbiology: Results for orders placed during the hospital encounter of 10/03/11  CULTURE, BLOOD (ROUTINE X 2)     Status: Normal   Collection Time   10/04/11  2:15 AM      Component Value Range Status Comment   Specimen Description BLOOD RIGHT FINGER   Final    Special Requests BOTTLES DRAWN AEROBIC ONLY [facility]   Final    Culture  Setup Time [id number]   Final    Culture     Final    Value: METHICILLIN RESISTANT STAPHYLOCOCCUS AUREUS     Note: RIFAMPIN AND GENTAMICIN SHOULD  NOT BE USED AS SINGLE DRUGS FOR TREATMENT OF STAPH INFECTIONS. This organism DOES NOT demonstrate inducible Clindamycin resistance in vitro. CRITICAL RESULT CALLED TO, READ BACK BY AND VERIFIED WITH: JENAE M @ 1430      10/06/11 BY KRAWS     Note: Gram Stain Report Called to,Read Back By and Verified With: Elsie Lincoln RN on 10/05/11 at 00:35 by Christie Nottingham   Report Status 10/07/2011 FINAL   Final    Organism ID, Bacteria METHICILLIN RESISTANT STAPHYLOCOCCUS AUREUS   Final   CULTURE, BLOOD (ROUTINE X 2)     Status: Normal   Collection Time   10/04/11  2:30 AM      Component Value Range Status Comment   Specimen Description BLOOD RIGHT HAND   Final    Special Requests BOTTLES DRAWN AEROBIC ONLY 5CC   Final    Culture  Setup Time [id number]   Final    Culture     Final    Value: STAPHYLOCOCCUS AUREUS     Note: SUSCEPTIBILITIES PERFORMED ON PREVIOUS CULTURE WITHIN THE LAST 5 DAYS.     Note: Gram Stain Report Called to,Read Back By and Verified With: Elsie Lincoln RN on  10/05/11 at 00:35 by Christie Nottingham   Report Status 10/07/2011 FINAL   Final   BODY FLUID CULTURE     Status: Normal   Collection Time   10/04/11  6:45 AM      Component Value Range Status Comment   Specimen Description PERITONEAL FLUID   Final    Special Requests 1 CUP   Final    Gram Stain     Final    Value: WBC PRESENT, PREDOMINANTLY PMN     NO ORGANISMS SEEN   Culture NO GROWTH 3 DAYS   Final    Report Status 10/07/2011 FINAL   Final   CULTURE, BLOOD (ROUTINE X 2)     Status: Normal (Preliminary result)   Collection Time   10/07/11  6:20 PM      Component Value Range Status Comment   Specimen Description BLOOD RIGHT HAND   Final    Special Requests BOTTLES DRAWN AEROBIC ONLY 1.5CC   Final    Culture  Setup Time [id number]   Final    Culture     Final    Value: YEAST     Note: Gram Stain Report Called to,Read Back By and Verified With: Bernie Covey RN on 10/11/11 at 04:16 by Christie Nottingham   Report Status PENDING   Incomplete   CULTURE, BLOOD (ROUTINE X 2)     Status: Normal (Preliminary result)   Collection Time   10/07/11  6:32 PM      Component Value Range Status Comment   Specimen Description BLOOD RIGHT ARM   Final    Special Requests BOTTLES DRAWN AEROBIC AND ANAEROBIC 10CC   Final    Culture  Setup Time [id number]   Final    Culture     Final    Value: GRAM NEGATIVE RODS     YEAST     Note: Gram Stain Report Called to,Read Back By and Verified With: ANN MCCRAW @1155  10/09/11 BY KRAWS Gram Stain Report Called to,Read Back By and Verified With: RN L. RIVERS ON 10/10/11 AT 1645 BY TEDAR   Report Status PENDING   Incomplete   CULTURE, BLOOD (ROUTINE X 2)     Status: Normal (Preliminary result)   Collection Time   10/10/11  6:10 AM  Component Value Range Status Comment   Specimen Description BLOOD RIGHT HAND   Final    Special Requests BOTTLES DRAWN AEROBIC ONLY 10CC   Final    Culture PENDING   Incomplete    Report Status PENDING   Incomplete   CULTURE, BLOOD (ROUTINE X  2)     Status: Normal (Preliminary result)   Collection Time   10/10/11  6:15 AM      Component Value Range Status Comment   Specimen Description BLOOD LEFT ARM   Final    Special Requests BOTTLES DRAWN AEROBIC ONLY 10CC   Final    Culture PENDING   Incomplete    Report Status PENDING   Incomplete     Coagulation Studies:  Basename 10/10/11 2109  LABPROT 18.1*  INR 1.47    Urinalysis: No results found for this basename: COLORURINE:2,APPERANCEUR:2,LABSPEC:2,PHURINE:2,GLUCOSEU:2,HGBUR:2,BILIRUBINUR:2,KETONESUR:2,PROTEINUR:2,UROBILINOGEN:2,NITRITE:2,LEUKOCYTESUR:2 in the last 72 hours    Imaging: Ct Abdomen Pelvis W Contrast  10/10/2011  *RADIOLOGY REPORT*  Clinical Data: Evaluate pseudocyst  CT ABDOMEN AND PELVIS WITH CONTRAST  Technique:  Multidetector CT imaging of the abdomen and pelvis was performed following the standard protocol during bolus administration of intravenous contrast.  Contrast: OMNIPAQUE IOHEXOL 300 MG/ML  SOLN  Comparison: 10/03/2011  Findings: Inflammatory changes about the head of the pancreas as well as edema throughout the peritoneal fat has markedly increased. The portal vein is now thrombosed.  Splenic vein is thrombosed. Portal venous branches throughout the liver are thrombosed. Portal venous gas in the left lobe is noted on image 24 of series 2. There is a small amount of gas in the portal vein at its juncture with the splenic and superior mesenteric veins.  See image 13 of series 5.  On image 35, there is a collection of contrast that extends from the lumen into the lesser sac.  This is worrisome for gastric perforation and spillage of contrast into the lesser sac.  There is a complex mass in the tail of the pancreas previously thought to represent a hemorrhagic pseudocyst that is not significantly changed. It is characterized by a curvilinear areas of enhancement with low density.  Some of these areas of low density are likely cystic and others may represent  thrombosed splenic vein. There are gas bubbles within this complex mass which may represent gas in the splenic vein.  There is some pancreatic parenchyma that is enhancing about the thrombosed splenic vein.  Chronic parenchyma atrophy is noted.  The pancreatic head is heterogeneous with areas of low density mixed with enhancing parenchyma.  This finding is felt to represent borderline necrosis.  1.4 x 2.9 cm soft tissue density anterior to the celiac axis may represent an enlarged node.  Other borderline enlarged left periaortic nodes are seen in the abdomen.  Moderate ascites surrounding the spleen and liver.  Post cholecystectomy.  Stable appearance of the kidneys and adrenal glands.  Large amount of free fluid in the pelvis.  Bladder, uterus, and adnexa are within normal limits.  Bibasilar atelectasis verses airspace disease. Small pleural effusions.  Severe cardiomegaly.  Peritoneal dialysis catheter in the left lower quadrant is stable.  IMPRESSION: Gastric perforation with spillage of oral contrast into the lesser sac.  Portal and splenic veins are thrombosed.  There is gas within the thrombus worrisome for septic thrombophlebitis.  Portal venous gas in the liver is also present.  Increased pancreatic inflammatory change as well as edema within the peritoneal fat.  Increased ascites  An element of pancreatic necrosis  is suspected given the poor enhancement the pancreas.  Large mass in the tail of the pancreas of unknown significance.  It is heterogeneous and likely contains thrombosed splenic vein with gas.  It is probably an inflammatory mass at this point.  Mild abnormal adenopathy as described.  Bibasilar atelectasis worse airspace disease and small bilateral effusions.  Critical Value/emergent results were called by telephone at the time of interpretation on 10/10/2011  at 1750 hours  to  Dr. Venetia Constable, who verbally acknowledged these results.  Original Report Authenticated By: Donavan Burnet, M.D.      Medications:      . dextrose 5 % and 0.9% NaCl 10 mL (10/10/11 2148)  . dialysis solution 1.5% low-MG/low-CA        . cefTAZidime (FORTAZ)  IV  500 mg Intravenous Q24H  . darbepoetin (ARANESP) injection - NON-DIALYSIS  40 mcg Subcutaneous Q Sun-1800  . feeding supplement  1 Container Oral TID BM  . insulin aspart  0-9 Units Subcutaneous QID  . micafungin (MYCAMINE) IV  100 mg Intravenous Q24H  . multivitamin  1 tablet Oral QHS  . pantoprazole  40 mg Oral Q1200  . ranolazine  500 mg Oral Daily  . sevelamer  3,200 mg Oral TID WC  . simvastatin  10 mg Oral q1800  . sodium chloride  3 mL Intravenous Q12H  . DISCONTD: insulin aspart  0-9 Units Subcutaneous TID WC  . DISCONTD: insulin glargine  5 Units Subcutaneous Daily  . DISCONTD: pantoprazole (PROTONIX) IV  40 mg Intravenous Q24H   acetaminophen, acetaminophen, heparin, HYDROmorphone, hydrOXYzine, iohexol, ondansetron (ZOFRAN) IV, ondansetron, DISCONTD: HYDROmorphone  Assessment/ Plan:  ESRD- CAPD 1.5% bags blood pressure a little low  HTN controlled  Anemia stable Abdominal pain Localized perforation of posterior stomach wall/duodenum. PD may be aggravating. Quite low albumin. Discussed with Dr Ezzard Standing, not a candidate for surgery. MRSA bacteremia. Source unknown awaiting TEE  Candidaemia will need evaluation of pseudocyst possible abscess, opthalmology Consulted mycofungin started  Neuro confused at times  Consider NGT  Discussed with her daughter. Patient has voiced a refusal to want hemodialysis in the past.   We will continue PD for now, very challenging situation with limited options. Consider referal to tertiary center such as [place], where most of her care has been provided.  A decision will need to be made nest week about whether to transition to hemodialysis.   LOS: 8 Arley Garant W @TODAY @8 :46 AM

## 2011-10-11 NOTE — Progress Notes (Signed)
Received call from [facility] at 0415 on 10/11/2011 - Positive BC on Aerobic bottle collected on 10/07/2011 at 1820 - Positive for yeast.  [facility], Elray Mcgregor, made aware.  No further action at this time.  Will continue to monitor patient.  Stacie Glaze 10/11/2011

## 2011-10-11 NOTE — Progress Notes (Signed)
Chaplain's Note:  23:45 Paged for Bible for Pt via RN  Per pt dtr request.  On arrival pt on commode. Pt nurse advised me she will give Bible to pt.  Left Bible with RN.  Will follow-up as needed or requested.

## 2011-10-11 NOTE — Progress Notes (Signed)
Subjective: Pt found to have perforated viscus stomach, duodenum, intrabdoiminal abscess that appears walled off, portal vein thrombosis and ? Of pancreatic malignancy   Antibiotics:  Anti-infectives     Start     Dose/Rate Route Frequency Ordered Stop   10/09/11 1400   fluconazole (DIFLUCAN) tablet 100 mg  Status:  Discontinued        100 mg Oral Daily 10/09/11 1237 10/09/11 1312   10/09/11 1400   micafungin (MYCAMINE) 100 mg in sodium chloride 0.9 % 100 mL IVPB        100 mg 100 mL/hr over 1 Hours Intravenous Every 24 hours 10/09/11 1313     10/09/11 1300   cefTAZidime (FORTAZ) 500 mg in dextrose 5 % 50 mL IVPB  Status:  Discontinued        500 mg 100 mL/hr over 30 Minutes Intravenous Every 24 hours 10/09/11 0918 10/11/11 1028   10/08/11 1300   cefTAZidime (FORTAZ) 1 g in dextrose 5 % 50 mL IVPB  Status:  Discontinued        1 g 100 mL/hr over 30 Minutes Intravenous Every 24 hours 10/08/11 1151 10/09/11 0917   10/07/11 1000   piperacillin-tazobactam (ZOSYN) IVPB 2.25 g  Status:  Discontinued        2.25 g 100 mL/hr over 30 Minutes Intravenous 3 times per day 10/07/11 0927 10/08/11 1145   10/07/11 0930   vancomycin (VANCOCIN) 1,750 mg in sodium chloride 0.9 % 500 mL IVPB        1,750 mg 250 mL/hr over 120 Minutes Intravenous  Once 10/07/11 0927 10/07/11 1241   10/05/11 0300   vancomycin (VANCOCIN) 1,750 mg in sodium chloride 0.9 % 500 mL IVPB        1,750 mg 250 mL/hr over 120 Minutes Intravenous  Once 10/05/11 0204 10/05/11 0500   10/04/11 0300   Ampicillin-Sulbactam (UNASYN) 3 g in sodium chloride 0.9 % 100 mL IVPB  Status:  Discontinued     Comments: Unasyn 3 g IV q24h on Peritoneal Dialysis      3 g 100 mL/hr over 60 Minutes Intravenous Daily at bedtime 10/04/11 0151 10/07/11 0922          Medications: Scheduled Meds:    . darbepoetin (ARANESP) injection - NON-DIALYSIS  40 mcg Subcutaneous Q Sun-1800  . feeding supplement  1 Container Oral TID BM  . insulin  aspart  0-9 Units Subcutaneous QID  . micafungin (MYCAMINE) IV  100 mg Intravenous Q24H  . multivitamin  1 tablet Oral QHS  . pantoprazole  40 mg Oral Q1200  . ranolazine  500 mg Oral Daily  . sevelamer  3,200 mg Oral TID WC  . simvastatin  10 mg Oral q1800  . sodium chloride  3 mL Intravenous Q12H  . DISCONTD: cefTAZidime (FORTAZ)  IV  500 mg Intravenous Q24H  . DISCONTD: insulin aspart  0-9 Units Subcutaneous TID WC  . DISCONTD: insulin glargine  5 Units Subcutaneous Daily  . DISCONTD: pantoprazole (PROTONIX) IV  40 mg Intravenous Q24H   Continuous Infusions:    . dextrose 5 % and 0.9% NaCl 10 mL (10/10/11 2148)  . dialysis solution 1.5% low-MG/low-CA     PRN Meds:.acetaminophen, acetaminophen, heparin, HYDROmorphone, hydrOXYzine, iohexol, ondansetron (ZOFRAN) IV, ondansetron   Objective: Weight change: -7 lb 4.4 oz (-3.3 kg) No intake or output data in the 24 hours ending 10/11/11 1117 Blood pressure 115/74, pulse 94, temperature 98.8 F (37.1 C), temperature source Oral, resp. rate 16, height 5'  4" (1.626 m), weight 152 lb 1.9 oz (69 kg), SpO2 98.00%. Temp:  [97.4 F (36.3 C)-98.9 F (37.2 C)] 98.8 F (37.1 C) (04/28 0525) Pulse Rate:  [81-95] 94  (04/28 0525) Resp:  [16-17] 16  (04/28 0525) BP: (96-124)/(59-84) 115/74 mmHg (04/28 0525) SpO2:  [95 %-99 %] 98 % (04/28 0525) Weight:  [152 lb 1.9 oz (69 kg)] 152 lb 1.9 oz (69 kg) (04/27 2052)  Physical Exam: General: Much more awake and alert but still not fully oriented  General: alert oriented, getting PD  Lab Results:  Basename 10/11/11 0700 10/10/11 1008  WBC 13.0* 10.0  HGB 8.8* 8.2*  HCT 26.5* 25.2*  PLT 48* 37*    BMET  Basename 10/11/11 0700 10/10/11 1008  NA 129* 129*  K 3.9 3.7  CL 91* 91*  CO2 24 24  GLUCOSE 76 197*  BUN 53* 53*  CREATININE 4.47* 4.37*  CALCIUM 9.1 9.0    Micro Results: Recent Results (from the past 240 hour(s))  CULTURE, BLOOD (ROUTINE X 2)     Status: Normal    Collection Time   10/04/11  2:15 AM      Component Value Range Status Comment   Specimen Description BLOOD RIGHT FINGER   Final    Special Requests BOTTLES DRAWN AEROBIC ONLY [facility]   Final    Culture  Setup Time [id number]   Final    Culture     Final    Value: METHICILLIN RESISTANT STAPHYLOCOCCUS AUREUS     Note: RIFAMPIN AND GENTAMICIN SHOULD NOT BE USED AS SINGLE DRUGS FOR TREATMENT OF STAPH INFECTIONS. This organism DOES NOT demonstrate inducible Clindamycin resistance in vitro. CRITICAL RESULT CALLED TO, READ BACK BY AND VERIFIED WITH: JENAE M @ 1430      10/06/11 BY KRAWS     Note: Gram Stain Report Called to,Read Back By and Verified With: Elsie Lincoln RN on 10/05/11 at 00:35 by Christie Nottingham   Report Status 10/07/2011 FINAL   Final    Organism ID, Bacteria METHICILLIN RESISTANT STAPHYLOCOCCUS AUREUS   Final   CULTURE, BLOOD (ROUTINE X 2)     Status: Normal   Collection Time   10/04/11  2:30 AM      Component Value Range Status Comment   Specimen Description BLOOD RIGHT HAND   Final    Special Requests BOTTLES DRAWN AEROBIC ONLY 5CC   Final    Culture  Setup Time [id number]   Final    Culture     Final    Value: STAPHYLOCOCCUS AUREUS     Note: SUSCEPTIBILITIES PERFORMED ON PREVIOUS CULTURE WITHIN THE LAST 5 DAYS.     Note: Gram Stain Report Called to,Read Back By and Verified With: Elsie Lincoln RN on 10/05/11 at 00:35 by Christie Nottingham   Report Status 10/07/2011 FINAL   Final   BODY FLUID CULTURE     Status: Normal   Collection Time   10/04/11  6:45 AM      Component Value Range Status Comment   Specimen Description PERITONEAL FLUID   Final    Special Requests 1 CUP   Final    Gram Stain     Final    Value: WBC PRESENT, PREDOMINANTLY PMN     NO ORGANISMS SEEN   Culture NO GROWTH 3 DAYS   Final    Report Status 10/07/2011 FINAL   Final   CULTURE, BLOOD (ROUTINE X 2)     Status: Normal (Preliminary result)   Collection Time  10/07/11  6:20 PM      Component Value Range Status Comment    Specimen Description BLOOD RIGHT HAND   Final    Special Requests BOTTLES DRAWN AEROBIC ONLY 1.5CC   Final    Culture  Setup Time [id number]   Final    Culture     Final    Value: YEAST     Note: Gram Stain Report Called to,Read Back By and Verified With: Bernie Covey RN on 10/11/11 at 04:16 by Christie Nottingham   Report Status PENDING   Incomplete   CULTURE, BLOOD (ROUTINE X 2)     Status: Normal (Preliminary result)   Collection Time   10/07/11  6:32 PM      Component Value Range Status Comment   Specimen Description BLOOD RIGHT ARM   Final    Special Requests BOTTLES DRAWN AEROBIC AND ANAEROBIC 10CC   Final    Culture  Setup Time [id number]   Final    Culture     Final    Value: GRAM NEGATIVE RODS     YEAST     Note: Gram Stain Report Called to,Read Back By and Verified With: ANN MCCRAW @1155  10/09/11 BY KRAWS Gram Stain Report Called to,Read Back By and Verified With: RN L. RIVERS ON 10/10/11 AT 1645 BY TEDAR   Report Status PENDING   Incomplete   CULTURE, BLOOD (ROUTINE X 2)     Status: Normal (Preliminary result)   Collection Time   10/10/11  6:10 AM      Component Value Range Status Comment   Specimen Description BLOOD RIGHT HAND   Final    Special Requests BOTTLES DRAWN AEROBIC ONLY 10CC   Final    Culture  Setup Time [id number]   Final    Culture     Final    Value:        BLOOD CULTURE RECEIVED NO GROWTH TO DATE CULTURE WILL BE HELD FOR 5 DAYS BEFORE ISSUING A FINAL NEGATIVE REPORT   Report Status PENDING   Incomplete   CULTURE, BLOOD (ROUTINE X 2)     Status: Normal (Preliminary result)   Collection Time   10/10/11  6:15 AM      Component Value Range Status Comment   Specimen Description BLOOD LEFT ARM   Final    Special Requests BOTTLES DRAWN AEROBIC ONLY 10CC   Final    Culture  Setup Time [id number]   Final    Culture     Final    Value:        BLOOD CULTURE RECEIVED NO GROWTH TO DATE CULTURE WILL BE HELD FOR 5 DAYS BEFORE ISSUING A FINAL NEGATIVE REPORT   Report  Status PENDING   Incomplete     Studies/Results: Ct Abdomen Pelvis W Contrast  10/10/2011  *RADIOLOGY REPORT*  Clinical Data: Evaluate pseudocyst  CT ABDOMEN AND PELVIS WITH CONTRAST  Technique:  Multidetector CT imaging of the abdomen and pelvis was performed following the standard protocol during bolus administration of intravenous contrast.  Contrast: OMNIPAQUE IOHEXOL 300 MG/ML  SOLN  Comparison: 10/03/2011  Findings: Inflammatory changes about the head of the pancreas as well as edema throughout the peritoneal fat has markedly increased. The portal vein is now thrombosed.  Splenic vein is thrombosed. Portal venous branches throughout the liver are thrombosed. Portal venous gas in the left lobe is noted on image 24 of series 2. There is a small amount of gas in the portal vein at its  juncture with the splenic and superior mesenteric veins.  See image 13 of series 5.  On image 35, there is a collection of contrast that extends from the lumen into the lesser sac.  This is worrisome for gastric perforation and spillage of contrast into the lesser sac.  There is a complex mass in the tail of the pancreas previously thought to represent a hemorrhagic pseudocyst that is not significantly changed. It is characterized by a curvilinear areas of enhancement with low density.  Some of these areas of low density are likely cystic and others may represent thrombosed splenic vein. There are gas bubbles within this complex mass which may represent gas in the splenic vein.  There is some pancreatic parenchyma that is enhancing about the thrombosed splenic vein.  Chronic parenchyma atrophy is noted.  The pancreatic head is heterogeneous with areas of low density mixed with enhancing parenchyma.  This finding is felt to represent borderline necrosis.  1.4 x 2.9 cm soft tissue density anterior to the celiac axis may represent an enlarged node.  Other borderline enlarged left periaortic nodes are seen in the abdomen.   Moderate ascites surrounding the spleen and liver.  Post cholecystectomy.  Stable appearance of the kidneys and adrenal glands.  Large amount of free fluid in the pelvis.  Bladder, uterus, and adnexa are within normal limits.  Bibasilar atelectasis verses airspace disease. Small pleural effusions.  Severe cardiomegaly.  Peritoneal dialysis catheter in the left lower quadrant is stable.  IMPRESSION: Gastric perforation with spillage of oral contrast into the lesser sac.  Portal and splenic veins are thrombosed.  There is gas within the thrombus worrisome for septic thrombophlebitis.  Portal venous gas in the liver is also present.  Increased pancreatic inflammatory change as well as edema within the peritoneal fat.  Increased ascites  An element of pancreatic necrosis is suspected given the poor enhancement the pancreas.  Large mass in the tail of the pancreas of unknown significance.  It is heterogeneous and likely contains thrombosed splenic vein with gas.  It is probably an inflammatory mass at this point.  Mild abnormal adenopathy as described.  Bibasilar atelectasis worse airspace disease and small bilateral effusions.  Critical Value/emergent results were called by telephone at the time of interpretation on 10/10/2011  at 1750 hours  to  Dr. Venetia Constable, who verbally acknowledged these results.  Original Report Authenticated By: Donavan Burnet, M.D.      Assessment/Plan: [name] is a 61 y.o. female withwith ESRD on PD with MRSA bacteremia , candidemia now found to have perforated duodenum/stomach with intrabdominal abscess, portal vein thrombosis and ? Pancreatic malignancy  1) Perforated viscus: Reviewed films with radiology this am. PD catheter NOT clearly communicating with her abdominal process. That being said I think it should come out. I explained situation to pt today and she told me SHE WOULD AGREE TO HD --therefore I would place temporary HD catheter (blood cultures appear clear on 3rd  set) --broadened to meropenem, in addition to mycafungin, and vancomycin  2) MRSA bacteremia: -- vancomycin --repeat blood cultures clearing finally --TEE could be helpful though ruling in or out endocarditis appears least of her problems at this point  3) Candidemia --continue mycafungin --Repeat blood cultures --Patient will need dedicated a funduscopic exam by ophthalmology to exclude endophthalmitis.   LOS: 8 days   Acey Lav 10/11/2011, 11:17 AM

## 2011-10-11 NOTE — Progress Notes (Addendum)
Yesterday we were called to set her up for transesophageal echocardiogram given her MRSA bacteremia as well as fungal bacteremia.  After reading Dr. Lattie Haw note she has a perforated viscus/duodenum/stomach with abdominal abscess.  She would be extremely high risk at this point for transesophageal echocardiogram given the possibility of insertion of endoscope into stomach.  I personally discussed with Dr. Algis Liming and we will cancel TEE. Regardless of findings on TEE, it would not change the duration of antibiotics and she would not be a surgical candidate for a valve replacement at this point.  Personally spoke with patient.  BP 124/84, HR 60 GEN: Alert in NAD, no obvious dyspnea  Note ICD (CXR reviewed personally), Vpaced  Please let us know if we can be of further assistance. For now we will sign off.

## 2011-10-11 NOTE — Progress Notes (Signed)
ANTIBIOTIC CONSULT NOTE - FOLLOW UP  Pharmacy Consult for Vancomycin/merropenum Indication: Bacteremia  Allergies  Allergen Reactions  . Morphine And Related Anaphylaxis  . Codeine Itching  . Lisinopril     Language changed    Patient Measurements: Height: 5\' 4"  (162.6 cm) Weight: 152 lb 1.9 oz (69 kg) IBW/kg (Calculated) : 54.7   Vital Signs: Temp: 98.8 F (37.1 C) (04/28 0525) Temp src: Oral (04/28 0525) BP: 115/74 mmHg (04/28 0525) Pulse Rate: 94  (04/28 0525)  Labs:  Basename 10/11/11 0700 10/10/11 1008 10/09/11 0555  WBC 13.0* 10.0 11.7*  HGB 8.8* 8.2* 7.8*  PLT 48* 37* 41*  LABCREA -- -- --  CREATININE 4.47* 4.37* 4.69*   Estimated Creatinine Clearance: 12.4 ml/min (by C-G formula based on Cr of 4.47).  Basename 10/10/11 0610  VANCOTROUGH --  Leodis Binet --  VANCORANDOM 30.0  GENTTROUGH --  GENTPEAK --  GENTRANDOM --  TOBRATROUGH --  TOBRAPEAK --  TOBRARND --  AMIKACINPEAK --  AMIKACINTROU --  AMIKACIN --     Microbiology: Recent Results (from the past 720 hour(s))  MRSA PCR SCREENING     Status: Normal   Collection Time   09/14/11  8:49 AM      Component Value Range Status Comment   MRSA by PCR NEGATIVE  NEGATIVE  Final   CULTURE, BLOOD (ROUTINE X 2)     Status: Normal   Collection Time   10/04/11  2:15 AM      Component Value Range Status Comment   Specimen Description BLOOD RIGHT FINGER   Final    Special Requests BOTTLES DRAWN AEROBIC ONLY [facility]   Final    Culture  Setup Time [id number]   Final    Culture     Final    Value: METHICILLIN RESISTANT STAPHYLOCOCCUS AUREUS     Note: RIFAMPIN AND GENTAMICIN SHOULD NOT BE USED AS SINGLE DRUGS FOR TREATMENT OF STAPH INFECTIONS. This organism DOES NOT demonstrate inducible Clindamycin resistance in vitro. CRITICAL RESULT CALLED TO, READ BACK BY AND VERIFIED WITH: JENAE M @ 1430      10/06/11 BY KRAWS     Note: Gram Stain Report Called to,Read Back By and Verified With: Elsie Lincoln RN on 10/05/11 at  00:35 by Christie Nottingham   Report Status 10/07/2011 FINAL   Final    Organism ID, Bacteria METHICILLIN RESISTANT STAPHYLOCOCCUS AUREUS   Final   CULTURE, BLOOD (ROUTINE X 2)     Status: Normal   Collection Time   10/04/11  2:30 AM      Component Value Range Status Comment   Specimen Description BLOOD RIGHT HAND   Final    Special Requests BOTTLES DRAWN AEROBIC ONLY 5CC   Final    Culture  Setup Time [id number]   Final    Culture     Final    Value: STAPHYLOCOCCUS AUREUS     Note: SUSCEPTIBILITIES PERFORMED ON PREVIOUS CULTURE WITHIN THE LAST 5 DAYS.     Note: Gram Stain Report Called to,Read Back By and Verified With: Elsie Lincoln RN on 10/05/11 at 00:35 by Christie Nottingham   Report Status 10/07/2011 FINAL   Final   BODY FLUID CULTURE     Status: Normal   Collection Time   10/04/11  6:45 AM      Component Value Range Status Comment   Specimen Description PERITONEAL FLUID   Final    Special Requests 1 CUP   Final    Gram Stain  Final    Value: WBC PRESENT, PREDOMINANTLY PMN     NO ORGANISMS SEEN   Culture NO GROWTH 3 DAYS   Final    Report Status 10/07/2011 FINAL   Final   CULTURE, BLOOD (ROUTINE X 2)     Status: Normal (Preliminary result)   Collection Time   10/07/11  6:20 PM      Component Value Range Status Comment   Specimen Description BLOOD RIGHT HAND   Final    Special Requests BOTTLES DRAWN AEROBIC ONLY 1.5CC   Final    Culture  Setup Time [id number]   Final    Culture     Final    Value: YEAST     Note: Gram Stain Report Called to,Read Back By and Verified With: Bernie Covey RN on 10/11/11 at 04:16 by Christie Nottingham   Report Status PENDING   Incomplete   CULTURE, BLOOD (ROUTINE X 2)     Status: Normal (Preliminary result)   Collection Time   10/07/11  6:32 PM      Component Value Range Status Comment   Specimen Description BLOOD RIGHT ARM   Final    Special Requests BOTTLES DRAWN AEROBIC AND ANAEROBIC 10CC   Final    Culture  Setup Time [id number]   Final    Culture      Final    Value: GRAM NEGATIVE RODS     YEAST     Note: Gram Stain Report Called to,Read Back By and Verified With: ANN MCCRAW @1155  10/09/11 BY KRAWS Gram Stain Report Called to,Read Back By and Verified With: RN L. RIVERS ON 10/10/11 AT 1645 BY TEDAR   Report Status PENDING   Incomplete   CULTURE, BLOOD (ROUTINE X 2)     Status: Normal (Preliminary result)   Collection Time   10/10/11  6:10 AM      Component Value Range Status Comment   Specimen Description BLOOD RIGHT HAND   Final    Special Requests BOTTLES DRAWN AEROBIC ONLY 10CC   Final    Culture  Setup Time [id number]   Final    Culture     Final    Value:        BLOOD CULTURE RECEIVED NO GROWTH TO DATE CULTURE WILL BE HELD FOR 5 DAYS BEFORE ISSUING A FINAL NEGATIVE REPORT   Report Status PENDING   Incomplete   CULTURE, BLOOD (ROUTINE X 2)     Status: Normal (Preliminary result)   Collection Time   10/10/11  6:15 AM      Component Value Range Status Comment   Specimen Description BLOOD LEFT ARM   Final    Special Requests BOTTLES DRAWN AEROBIC ONLY 10CC   Final    Culture  Setup Time [id number]   Final    Culture     Final    Value:        BLOOD CULTURE RECEIVED NO GROWTH TO DATE CULTURE WILL BE HELD FOR 5 DAYS BEFORE ISSUING A FINAL NEGATIVE REPORT   Report Status PENDING   Incomplete     Anti-infectives     Start     Dose/Rate Route Frequency Ordered Stop   10/09/11 1400   fluconazole (DIFLUCAN) tablet 100 mg  Status:  Discontinued        100 mg Oral Daily 10/09/11 1237 10/09/11 1312   10/09/11 1400   micafungin (MYCAMINE) 100 mg in sodium chloride 0.9 % 100 mL IVPB  100 mg 100 mL/hr over 1 Hours Intravenous Every 24 hours 10/09/11 1313     10/09/11 1300   cefTAZidime (FORTAZ) 500 mg in dextrose 5 % 50 mL IVPB  Status:  Discontinued        500 mg 100 mL/hr over 30 Minutes Intravenous Every 24 hours 10/09/11 0918 10/11/11 1028   10/08/11 1300   cefTAZidime (FORTAZ) 1 g in dextrose 5 % 50 mL IVPB  Status:   Discontinued        1 g 100 mL/hr over 30 Minutes Intravenous Every 24 hours 10/08/11 1151 10/09/11 0917   10/07/11 1000   piperacillin-tazobactam (ZOSYN) IVPB 2.25 g  Status:  Discontinued        2.25 g 100 mL/hr over 30 Minutes Intravenous 3 times per day 10/07/11 0927 10/08/11 1145   10/07/11 0930   vancomycin (VANCOCIN) 1,750 mg in sodium chloride 0.9 % 500 mL IVPB        1,750 mg 250 mL/hr over 120 Minutes Intravenous  Once 10/07/11 0927 10/07/11 1241   10/05/11 0300   vancomycin (VANCOCIN) 1,750 mg in sodium chloride 0.9 % 500 mL IVPB        1,750 mg 250 mL/hr over 120 Minutes Intravenous  Once 10/05/11 0204 10/05/11 0500   10/04/11 0300   Ampicillin-Sulbactam (UNASYN) 3 g in sodium chloride 0.9 % 100 mL IVPB  Status:  Discontinued     Comments: Unasyn 3 g IV q24h on Peritoneal Dialysis      3 g 100 mL/hr over 60 Minutes Intravenous Daily at bedtime 10/04/11 0151 10/07/11 0865          Assessment: MRSA bactermia/fungemia/intraabdominal abcess/perfed stomach and peritoneal dialysis patient.  Anticoagulation: SCDs. Platelets only 48.  Infectious Disease: Elita Quick #3/7 - d/ced / start meropenum for broader and anaerobic coverage/ Vanc #7/Micafungin #3 MRSA bacteremia, on peritoneal dialysis, not sure she is a good candidate for PD, may change to HD-pt has refused in the past -ID discussed with pt may start HD . Tmax 99.5, WBC=10, seven days of Fortaz for diffuse airspace opacities. Vanco doses 4/22, 4/24,   1) Vancomycin level = 30, Repeat dose when Level <=20 - check level with AML 4/29 unless goes for HD today 2) Meropenum 500mg IV q24 hr  Cardiovascular: CAD, s/p CABG, VSS, ischemic cardiomyopathy=EF=20% on Ranexa  Endocrinology: DM, CBGs 144-174 on SSI and Lantus 5/day  Gastrointestinal / Nutrition: gallstone pancreatitis, chronic with pseudocysts, IV PPI  Nephrology: PD, Hgb=8.2  PTA Medication Issues: None  Best Practices: Scds.    Goal of Therapy:  <=20 for  redosing  Plan:  Check Vanco level tomorrow and redose if <=20.  Marcelino Scot 10/11/2011,11:27 AM

## 2011-10-11 NOTE — Progress Notes (Signed)
SUBJECTIVE Feels ok, but c/o abdominal pain.   1. Pancreatic pseudocyst   2. Epigastric abdominal pain   3. End stage renal disease     Past Medical History  Diagnosis Date  . Gallstone pancreatitis   . Coronary artery disease     s/p CABG 2008 with multiple PCIs  . Ischemic cardiomyopathy   . End stage renal disease   . Diabetes mellitus   . Hypertension   . History of nonadherence to medical treatment   . LBBB (left bundle branch block)   . Hyperlipidemia   . Renal insufficiency   . CHF (congestive heart failure)   . Dialysis patient   . Anemia    Current Facility-Administered Medications  Medication Dose Route Frequency Provider Last Rate Last Dose  . acetaminophen (TYLENOL) tablet 650 mg  650 mg Oral Q6H PRN Ron Parker, MD   650 mg at 10/10/11 0348   Or  . acetaminophen (TYLENOL) suppository 650 mg  650 mg Rectal Q6H PRN Ron Parker, MD      . darbepoetin (ARANESP) injection 40 mcg  40 mcg Subcutaneous Q Sun-1800 Sheffield Slider, PA   40 mcg at 10/04/11 1704  . dextrose 5 %-0.9 % sodium chloride infusion   Intravenous Continuous Jamison Yuhasz, MD 10 mL/hr at 10/10/11 2148 10 mL at 10/10/11 2148  . dialysis solution 1.5% low-MG/low-CA (DELFLEX) CAPD   Intraperitoneal Continuous Garnetta Buddy, MD      . feeding supplement (RESOURCE BREEZE) liquid 1 Container  1 Container Oral TID BM Sheffield Slider, PA   1 Container at 10/10/11 1536  . heparin injection 500 Units  500 Units Intraperitoneal PRN Zada Girt, MD   500 Units at 10/08/11 [phone]  . HYDROmorphone (DILAUDID) injection 0.5 mg  0.5 mg Intravenous Q4H PRN Epimenio Schetter, MD   0.5 mg at 10/11/11 1026  . hydrOXYzine (ATARAX/VISTARIL) tablet 10 mg  10 mg Oral Q12H PRN Clanford L Johnson, MD      . insulin aspart (novoLOG) injection 0-9 Units  0-9 Units Subcutaneous QID Henretta Quist, MD      . iohexol (OMNIPAQUE) 300 MG/ML solution 100 mL  100 mL Intravenous Once PRN Medication Radiologist, MD   100 mL at  10/10/11 1719  . meropenem (MERREM) 500 mg in sodium chloride 0.9 % 50 mL IVPB  500 mg Intravenous Q24H Yannis Broce, MD   500 mg at 10/11/11 1248  . micafungin (MYCAMINE) 100 mg in sodium chloride 0.9 % 100 mL IVPB  100 mg Intravenous Q24H Randall Hiss, MD   100 mg at 10/10/11 1738  . multivitamin (RENA-VIT) tablet 1 tablet  1 tablet Oral QHS Rhetta Mura, MD   1 tablet at 10/09/11 2158  . ondansetron (ZOFRAN) tablet 4 mg  4 mg Oral Q6H PRN Ron Parker, MD       Or  . ondansetron (ZOFRAN) injection 4 mg  4 mg Intravenous Q6H PRN Ron Parker, MD   4 mg at 10/04/11 0415  . pantoprazole (PROTONIX) EC tablet 40 mg  40 mg Oral Q1200 Crystal Stillinger Robertson, PHARMD   40 mg at 10/10/11 1250  . ranolazine (RANEXA) 12 hr tablet 500 mg  500 mg Oral Daily Ron Parker, MD   500 mg at 10/10/11 1052  . sevelamer (RENVELA) tablet 3,200 mg  3,200 mg Oral TID WC Jibri Schriefer, MD   3,200 mg at 10/10/11 1802  . simvastatin (ZOCOR) tablet 10 mg  10  mg Oral q1800 Ron Parker, MD   10 mg at 10/10/11 1802  . sodium chloride 0.9 % injection 3 mL  3 mL Intravenous Q12H Ron Parker, MD   3 mL at 10/10/11 1051  . DISCONTD: cefTAZidime (FORTAZ) 500 mg in dextrose 5 % 50 mL IVPB  500 mg Intravenous Q24H Beena Catano, MD   500 mg at 10/10/11 1543  . DISCONTD: insulin aspart (novoLOG) injection 0-9 Units  0-9 Units Subcutaneous TID WC Clanford Cyndie Mull, MD   2 Units at 10/10/11 1250  . DISCONTD: insulin glargine (LANTUS) injection 5 Units  5 Units Subcutaneous Daily Clanford Cyndie Mull, MD   5 Units at 10/10/11 1051   Allergies  Allergen Reactions  . Morphine And Related Anaphylaxis  . Codeine Itching  . Lisinopril     Language changed   Principal Problem:  *Epigastric abdominal pain Active Problems:  Ischemic cardiomyopathy  Chronic systolic congestive heart failure  End stage renal disease  Pancreatic pseudocyst  Pancreatitis chronic  Hyperkalemia   Hyponatremia   Vital signs in last 24 hours: Temp:  [97.4 F (36.3 C)-98.9 F (37.2 C)] 98.8 F (37.1 C) (04/28 0525) Pulse Rate:  [81-95] 94  (04/28 0525) Resp:  [16-17] 16  (04/28 0525) BP: (103-124)/(59-84) 115/74 mmHg (04/28 0525) SpO2:  [95 %-99 %] 98 % (04/28 0525) Weight:  [69 kg (152 lb 1.9 oz)] 69 kg (152 lb 1.9 oz) (04/27 2052) Weight change: -3.3 kg (-7 lb 4.4 oz) Last BM Date: 10/09/11  Intake/Output from previous day: 04/27 0701 - 04/28 0700 In: 50 [P.O.:50] Out: -  Intake/Output this shift:    Lab Results:  Basename 10/11/11 0700 10/10/11 1008  WBC 13.0* 10.0  HGB 8.8* 8.2*  HCT 26.5* 25.2*  PLT 48* 37*   BMET  Basename 10/11/11 0700 10/10/11 1008  NA 129* 129*  K 3.9 3.7  CL 91* 91*  CO2 24 24  GLUCOSE 76 197*  BUN 53* 53*  CREATININE 4.47* 4.37*  CALCIUM 9.1 9.0    Studies/Results: Ct Abdomen Pelvis W Contrast  10/10/2011  *RADIOLOGY REPORT*  Clinical Data: Evaluate pseudocyst  CT ABDOMEN AND PELVIS WITH CONTRAST  Technique:  Multidetector CT imaging of the abdomen and pelvis was performed following the standard protocol during bolus administration of intravenous contrast.  Contrast: OMNIPAQUE IOHEXOL 300 MG/ML  SOLN  Comparison: 10/03/2011  Findings: Inflammatory changes about the head of the pancreas as well as edema throughout the peritoneal fat has markedly increased. The portal vein is now thrombosed.  Splenic vein is thrombosed. Portal venous branches throughout the liver are thrombosed. Portal venous gas in the left lobe is noted on image 24 of series 2. There is a small amount of gas in the portal vein at its juncture with the splenic and superior mesenteric veins.  See image 13 of series 5.  On image 35, there is a collection of contrast that extends from the lumen into the lesser sac.  This is worrisome for gastric perforation and spillage of contrast into the lesser sac.  There is a complex mass in the tail of the pancreas previously  thought to represent a hemorrhagic pseudocyst that is not significantly changed. It is characterized by a curvilinear areas of enhancement with low density.  Some of these areas of low density are likely cystic and others may represent thrombosed splenic vein. There are gas bubbles within this complex mass which may represent gas in the splenic vein.  There is some  pancreatic parenchyma that is enhancing about the thrombosed splenic vein.  Chronic parenchyma atrophy is noted.  The pancreatic head is heterogeneous with areas of low density mixed with enhancing parenchyma.  This finding is felt to represent borderline necrosis.  1.4 x 2.9 cm soft tissue density anterior to the celiac axis may represent an enlarged node.  Other borderline enlarged left periaortic nodes are seen in the abdomen.  Moderate ascites surrounding the spleen and liver.  Post cholecystectomy.  Stable appearance of the kidneys and adrenal glands.  Large amount of free fluid in the pelvis.  Bladder, uterus, and adnexa are within normal limits.  Bibasilar atelectasis verses airspace disease. Small pleural effusions.  Severe cardiomegaly.  Peritoneal dialysis catheter in the left lower quadrant is stable.  IMPRESSION: Gastric perforation with spillage of oral contrast into the lesser sac.  Portal and splenic veins are thrombosed.  There is gas within the thrombus worrisome for septic thrombophlebitis.  Portal venous gas in the liver is also present.  Increased pancreatic inflammatory change as well as edema within the peritoneal fat.  Increased ascites  An element of pancreatic necrosis is suspected given the poor enhancement the pancreas.  Large mass in the tail of the pancreas of unknown significance.  It is heterogeneous and likely contains thrombosed splenic vein with gas.  It is probably an inflammatory mass at this point.  Mild abnormal adenopathy as described.  Bibasilar atelectasis worse airspace disease and small bilateral effusions.   Critical Value/emergent results were called by telephone at the time of interpretation on 10/10/2011  at 1750 hours  to  Dr. Venetia Constable, who verbally acknowledged these results.  Original Report Authenticated By: Donavan Burnet, M.D.    Medications: I have reviewed the patient's current medications.   Physical exam GENERAL- alert HEAD- normal atraumatic, no neck masses, normal thyroid, no jvd RESPIRATORY- appears well, vitals normal, no respiratory distress, acyanotic, normal RR, ear and throat exam is normal, neck free of mass or lymphadenopathy, chest clear, no wheezing, crepitations, rhonchi, normal symmetric air entry CVS- regular rate and rhythm, S1, S2 normal, no murmur, click, rub or gallop ABDOMEN-getting PD. Some tenderness to palpation. NEURO- Grossly normal- much more with it now. EXTREMITIES- extremities normal, atraumatic, no cyanosis or edema  Plan  * MRSA bacteremia/Fungemia/Gram negative bacteremia- Appreciate ID. On micafungin. Dr Tanner(ophthalmology) will see patient for eye exam. Continue mx per ID *Perforated Viscus- appreciate surgery. Difficult situation. patient says she would want to proceed with surgery, if an option. I have explained that she is a very high risk candidate..  * Ischemic cardiomyopathy/ Chronic systolic congestive heart failure(20%)- stable.  * End stage renal disease-on PD. Appreciate renal.  * Pancreatic pseudocyst/ Pancreatitis chronic- tolerating feeding, refusing ngt. ?drain pseudocyst  * Anemia of chronic kidney disease- some drop in hb, no overt bleeding. Received unit of blood. *Thrombocytopenia- may be related to ongoing infection. Monitor.  * encephalopathy- likely related to infection. Has improved, finally.  Discussed with patient's daughter yesterday. She says family would like everything possible to be done.  Dvt/gi prophylaxis.      Fani Rotondo 10/11/2011 1:00 PM Pager: [phone].

## 2011-10-11 NOTE — Progress Notes (Addendum)
General Surgery Note  LOS: 8 days  Room - 6734  Assessment/Plan: 1.  Probable decompression of retrogastric pseudocyst into stomach/duodenum.    I reviewed films from a CT scan done 09/13/2011  This extravasation of contrast is in the previously noted pseudocyst cavity.  This is actually a surgical procedure we do for pseudocysts (drain them into the posterior wall of the stomach). This explains why the contrast extravasation is limited, why there seems to be no communication with PD cath, and why she is not sicker.  So I can see no role for surgery for the extravasation.  Possibly get GI to endoscope the patient, but this needs a thorough discussion of what goals that would accomplish.  Her abdominal pain is chronic and hard to sort out of her acute illness.  Does her PD cath need to be removed?  This needs to be a discussion with patient (and family), renal, and surgery.  Anything done to her is very high risk.  2.  Ischemic cardiomyopathy  S/P CABG - 2008 3.  Chronic systolic congestive heart failure  Has pacemaker    4.  End stage renal disease - on PD  With the findings - does the PD catheter need to be removed and have her switched to HD? 5.  Pancreatitis chronic, Pancreatic pseudocyst vs hemorrhagic pancreas at the tail of the pancreas.    6.  Blood cltures positive for yeast - collected 10/07/2011  On Mycamine. 7.  Portal vein/splenic vein with clot - seen on CT scan 10/10/2011.  Probably due to chronic pancreatitis and pseudocyst.   8.  Severe malnutrition - Alb - 1.0 - 10/10/2011  Does she need to be started on TPN? 9.  Thrombocytopenia - platelets - 37,000 - 10/10/2011  10.  INR mildly elevated - PT - 18.1/INR - 1.47 - 10/10/2011 11.  Diabetes, insulin dependent.  Subjective:  Complains of abdominal pain.  No nausea or vomiting.  Mildly confused. Objective:   Filed Vitals:   10/11/11 0525  BP: 115/74  Pulse: 94  Temp: 98.8 F (37.1 C)  Resp: 16     Intake/Output from  previous day:  04/27 0701 - 04/28 0700 In: 50 [P.O.:50] Out: -   Intake/Output this shift:      Physical Exam:   General: Older AA F who is mildly confused.   HEENT: Normal. Pupils equal. .   Lungs: Clear.   Abdomen: Decreased BS.  No rebound or peritoneal signs.   Neurologic:  Grossly intact to motor and sensory function.   Psychiatric: Mildly confused.   Lab Results:    Basename 10/10/11 1008 10/09/11 0555  WBC 10.0 11.7*  HGB 8.2* 7.8*  HCT 25.2* 24.1*  PLT 37* 41*    BMET   Basename 10/10/11 1008 10/09/11 0555  NA 129* 130*  K 3.7 4.5  CL 91* 93*  CO2 24 24  GLUCOSE 197* 181*  BUN 53* 59*  CREATININE 4.37* 4.69*  CALCIUM 9.0 9.2    PT/INR   Basename 10/10/11 2109  LABPROT 18.1*  INR 1.47    ABG  No results found for this basename: PHART:2,PCO2:2,PO2:2,HCO3:2 in the last 72 hours   Studies/Results:  Ct Abdomen Pelvis W Contrast  10/10/2011  *RADIOLOGY REPORT*  Clinical Data: Evaluate pseudocyst  CT ABDOMEN AND PELVIS WITH CONTRAST  Technique:  Multidetector CT imaging of the abdomen and pelvis was performed following the standard protocol during bolus administration of intravenous contrast.  Contrast: OMNIPAQUE IOHEXOL 300 MG/ML  SOLN  Comparison: 10/03/2011  Findings: Inflammatory changes about the head of the pancreas as well as edema throughout the peritoneal fat has markedly increased. The portal vein is now thrombosed.  Splenic vein is thrombosed. Portal venous branches throughout the liver are thrombosed. Portal venous gas in the left lobe is noted on image 24 of series 2. There is a small amount of gas in the portal vein at its juncture with the splenic and superior mesenteric veins.  See image 13 of series 5.  On image 35, there is a collection of contrast that extends from the lumen into the lesser sac.  This is worrisome for gastric perforation and spillage of contrast into the lesser sac.  There is a complex mass in the tail of the pancreas previously  thought to represent a hemorrhagic pseudocyst that is not significantly changed. It is characterized by a curvilinear areas of enhancement with low density.  Some of these areas of low density are likely cystic and others may represent thrombosed splenic vein. There are gas bubbles within this complex mass which may represent gas in the splenic vein.  There is some pancreatic parenchyma that is enhancing about the thrombosed splenic vein.  Chronic parenchyma atrophy is noted.  The pancreatic head is heterogeneous with areas of low density mixed with enhancing parenchyma.  This finding is felt to represent borderline necrosis.  1.4 x 2.9 cm soft tissue density anterior to the celiac axis may represent an enlarged node.  Other borderline enlarged left periaortic nodes are seen in the abdomen.  Moderate ascites surrounding the spleen and liver.  Post cholecystectomy.  Stable appearance of the kidneys and adrenal glands.  Large amount of free fluid in the pelvis.  Bladder, uterus, and adnexa are within normal limits.  Bibasilar atelectasis verses airspace disease. Small pleural effusions.  Severe cardiomegaly.  Peritoneal dialysis catheter in the left lower quadrant is stable.  IMPRESSION: Gastric perforation with spillage of oral contrast into the lesser sac.  Portal and splenic veins are thrombosed.  There is gas within the thrombus worrisome for septic thrombophlebitis.  Portal venous gas in the liver is also present.  Increased pancreatic inflammatory change as well as edema within the peritoneal fat.  Increased ascites  An element of pancreatic necrosis is suspected given the poor enhancement the pancreas.  Large mass in the tail of the pancreas of unknown significance.  It is heterogeneous and likely contains thrombosed splenic vein with gas.  It is probably an inflammatory mass at this point.  Mild abnormal adenopathy as described.  Bibasilar atelectasis worse airspace disease and small bilateral effusions.   Critical Value/emergent results were called by telephone at the time of interpretation on 10/10/2011  at 1750 hours  to  Dr. Venetia Constable, who verbally acknowledged these results.  Original Report Authenticated By: Donavan Burnet, M.D.     Anti-infectives:   Anti-infectives     Start     Dose/Rate Route Frequency Ordered Stop   10/09/11 1400   fluconazole (DIFLUCAN) tablet 100 mg  Status:  Discontinued        100 mg Oral Daily 10/09/11 1237 10/09/11 1312   10/09/11 1400   micafungin (MYCAMINE) 100 mg in sodium chloride 0.9 % 100 mL IVPB        100 mg 100 mL/hr over 1 Hours Intravenous Every 24 hours 10/09/11 1313     10/09/11 1300   cefTAZidime (FORTAZ) 500 mg in dextrose 5 % 50 mL IVPB  500 mg 100 mL/hr over 30 Minutes Intravenous Every 24 hours 10/09/11 0918     10/08/11 1300   cefTAZidime (FORTAZ) 1 g in dextrose 5 % 50 mL IVPB  Status:  Discontinued        1 g 100 mL/hr over 30 Minutes Intravenous Every 24 hours 10/08/11 1151 10/09/11 0917   10/07/11 1000   piperacillin-tazobactam (ZOSYN) IVPB 2.25 g  Status:  Discontinued        2.25 g 100 mL/hr over 30 Minutes Intravenous 3 times per day 10/07/11 0927 10/08/11 1145   10/07/11 0930   vancomycin (VANCOCIN) 1,750 mg in sodium chloride 0.9 % 500 mL IVPB        1,750 mg 250 mL/hr over 120 Minutes Intravenous  Once 10/07/11 0927 10/07/11 1241   10/05/11 0300   vancomycin (VANCOCIN) 1,750 mg in sodium chloride 0.9 % 500 mL IVPB        1,750 mg 250 mL/hr over 120 Minutes Intravenous  Once 10/05/11 0204 10/05/11 0500   10/04/11 0300   Ampicillin-Sulbactam (UNASYN) 3 g in sodium chloride 0.9 % 100 mL IVPB  Status:  Discontinued     Comments: Unasyn 3 g IV q24h on Peritoneal Dialysis      3 g 100 mL/hr over 60 Minutes Intravenous Daily at bedtime 10/04/11 0151 10/07/11 0922          Ovidio Kin, MD, FACS Pager: [phone],   [facility] Office: [phone] 10/11/2011

## 2011-10-12 ENCOUNTER — Encounter (HOSPITAL_COMMUNITY): Payer: Self-pay | Admitting: Radiology

## 2011-10-12 ENCOUNTER — Inpatient Hospital Stay (HOSPITAL_COMMUNITY): Payer: Medicare Other

## 2011-10-12 LAB — CBC
HCT: 26.1 % — ABNORMAL LOW (ref 36.0–46.0)
Hemoglobin: 8.6 g/dL — ABNORMAL LOW (ref 12.0–15.0)
MCHC: 33 g/dL (ref 30.0–36.0)
MCV: 86.7 fL (ref 78.0–100.0)
RDW: 18.4 % — ABNORMAL HIGH (ref 11.5–15.5)
WBC: 12.5 10*3/uL — ABNORMAL HIGH (ref 4.0–10.5)

## 2011-10-12 LAB — COMPREHENSIVE METABOLIC PANEL
Albumin: 1.1 g/dL — ABNORMAL LOW (ref 3.5–5.2)
Alkaline Phosphatase: 222 U/L — ABNORMAL HIGH (ref 39–117)
BUN: 50 mg/dL — ABNORMAL HIGH (ref 6–23)
Chloride: 92 mEq/L — ABNORMAL LOW (ref 96–112)
Creatinine, Ser: 4.34 mg/dL — ABNORMAL HIGH (ref 0.50–1.10)
GFR calc Af Amer: 12 mL/min — ABNORMAL LOW (ref >90)
Glucose, Bld: 108 mg/dL — ABNORMAL HIGH (ref 70–99)
Potassium: 3.6 mEq/L (ref 3.5–5.1)
Total Bilirubin: 0.4 mg/dL (ref 0.3–1.2)

## 2011-10-12 LAB — GLUCOSE, CAPILLARY
Glucose-Capillary: 103 mg/dL — ABNORMAL HIGH (ref 70–99)
Glucose-Capillary: 133 mg/dL — ABNORMAL HIGH (ref 70–99)
Glucose-Capillary: 149 mg/dL — ABNORMAL HIGH (ref 70–99)

## 2011-10-12 MED ORDER — HYDROMORPHONE HCL PF 1 MG/ML IJ SOLN
1.0000 mg | INTRAMUSCULAR | Status: DC | PRN
  Administered 2011-10-12 – 2011-10-14 (×9): 1 mg via INTRAVENOUS
  Filled 2011-10-12 (×11): qty 1

## 2011-10-12 MED ORDER — INSULIN ASPART 100 UNIT/ML ~~LOC~~ SOLN
0.0000 [IU] | Freq: Four times a day (QID) | SUBCUTANEOUS | Status: DC
  Administered 2011-10-12: 1 [IU] via SUBCUTANEOUS

## 2011-10-12 MED ORDER — SODIUM CHLORIDE 0.9 % IV SOLN
1.0000 g | INTRAVENOUS | Status: DC
  Administered 2011-10-12 – 2011-10-13 (×2): 1 g via INTRAVENOUS
  Filled 2011-10-12 (×3): qty 1

## 2011-10-12 MED ORDER — SODIUM CHLORIDE 0.9 % IV SOLN
300.0000 mg | Freq: Two times a day (BID) | INTRAVENOUS | Status: DC
  Administered 2011-10-13 – 2011-10-14 (×3): 300 mg via INTRAVENOUS
  Filled 2011-10-12 (×10): qty 300

## 2011-10-12 MED ORDER — HEPARIN SODIUM (PORCINE) 1000 UNIT/ML IJ SOLN
INTRAMUSCULAR | Status: AC
  Filled 2011-10-12: qty 1

## 2011-10-12 NOTE — H&P (Signed)
Chief Complaint: ESRD requiring HD. HPI: [name] is an 61 y.o. female with complex medical problems and acute bacteremia. She has been getting PD but now with an acute process in abdomen, concern for issues with PD. Request for placement of temp HD access.  Past Medical History:  Past Medical History  Diagnosis Date  . Gallstone pancreatitis   . Coronary artery disease     s/p CABG 2008 with multiple PCIs  . Ischemic cardiomyopathy   . End stage renal disease   . Diabetes mellitus   . Hypertension   . History of nonadherence to medical treatment   . LBBB (left bundle branch block)   . Hyperlipidemia   . Renal insufficiency   . CHF (congestive heart failure)   . Dialysis patient   . Anemia     Past Surgical History:  Past Surgical History  Procedure Date  . Coronary artery bypass graft 2008  . Cholecystectomy   . Abdominal hysterectomy   . Tubal ligation   . Tonsillectomy   . Insert / replace / remove pacemaker     pacemaker ICD  . Esophagogastroduodenoscopy 09/15/2011    Procedure: ESOPHAGOGASTRODUODENOSCOPY (EGD);  Surgeon: Theda Belfast, MD;  Location: [facility] ENDOSCOPY;  Service: Endoscopy;  Laterality: N/A;    Family History:  Family History  Problem Relation Age of Onset  . Coronary artery disease Mother   . Hypertension Mother   . Diabetes Mother   . Diabetes Sister   . Anesthesia problems Neg Hx     Social History:  reports that she quit smoking about 11 years ago. She does not have any smokeless tobacco history on file. She reports that she does not drink alcohol or use illicit drugs.  Allergies:  Allergies  Allergen Reactions  . Morphine And Related Anaphylaxis  . Codeine Itching  . Lisinopril     Language changed    Medications: Medications Prior to Admission  Medication Sig Dispense Refill  . carvedilol (COREG) 6.25 MG tablet Take 6.25 mg by mouth 2 (two) times daily with a meal.      . doxazosin (CARDURA) 2 MG tablet Take 2 mg by mouth at  bedtime.      . fentaNYL (DURAGESIC - DOSED MCG/HR) 50 MCG/HR Place 1 patch onto the skin every 3 (three) days.      . insulin glargine (LANTUS) 100 UNIT/ML injection Inject 10 Units into the skin daily as needed. Per sliding scale      . Multiple Vitamin (DAILY VITAMIN PO) Take 1 tablet by mouth daily.       . pravastatin (PRAVACHOL) 20 MG tablet Take 20 mg by mouth daily.        . ranolazine (RANEXA) 500 MG 12 hr tablet Take 500 mg by mouth daily.        . sevelamer (RENAGEL) 800 MG tablet Take 3,200 mg by mouth 3 (three) times daily with meals.         Please HPI for pertinent positives.  Blood pressure 142/99, pulse 92, temperature 98.2 F (36.8 C), temperature source Oral, resp. rate 20, height 5\' 4"  (1.626 m), weight 148 lb 12.8 oz (67.495 kg), SpO2 97.00%. Body mass index is 25.54 kg/(m^2).   General Appearance:  Cooperative, no distress, appears stated age  Head:  Normocephalic, without obvious abnormality, atraumatic  ENT: Unremarkable  Neck: Supple, symmetrical, trachea midline, no adenopathy, thyroid: not enlarged, symmetric, no tenderness/mass/nodules  Lungs:   Clear to auscultation bilaterally, no w/r/r, respirations unlabored  without use of accessory muscles.  Chest Wall:  No tenderness or deformity. (L)ant chest wall pacer  Heart:  Regular rate and rhythm, S1, S2 normal, no murmur, rub or gallop. Carotids 2+ without bruit.  Neurologic: Awake and alert, but may not understand exactly what is going on.   Results for orders placed during the hospital encounter of 10/03/11 (from the past 48 hour(s))  GLUCOSE, CAPILLARY     Status: Abnormal   Collection Time   10/10/11 12:23 PM      Component Value Range Comment   Glucose-Capillary 153 (*) 70 - 99 (mg/dL)   GLUCOSE, CAPILLARY     Status: Abnormal   Collection Time   10/10/11  5:42 PM      Component Value Range Comment   Glucose-Capillary 107 (*) 70 - 99 (mg/dL)   GLUCOSE, CAPILLARY     Status: Normal   Collection Time    10/10/11  8:50 PM      Component Value Range Comment   Glucose-Capillary 87  70 - 99 (mg/dL)    Comment 1 Notify RN     PROTIME-INR     Status: Abnormal   Collection Time   10/10/11  9:09 PM      Component Value Range Comment   Prothrombin Time 18.1 (*) 11.6 - 15.2 (seconds)    INR 1.47  0.00 - 1.49    CBC     Status: Abnormal   Collection Time   10/11/11  7:00 AM      Component Value Range Comment   WBC 13.0 (*) 4.0 - 10.5 (K/uL)    RBC 3.09 (*) 3.87 - 5.11 (MIL/uL)    Hemoglobin 8.8 (*) 12.0 - 15.0 (g/dL)    HCT 40.9 (*) 81.1 - 46.0 (%)    MCV 85.8  78.0 - 100.0 (fL)    MCH 28.5  26.0 - 34.0 (pg)    MCHC 33.2  30.0 - 36.0 (g/dL)    RDW 91.4 (*) 78.2 - 15.5 (%)    Platelets 48 (*) 150 - 400 (K/uL) CONSISTENT WITH PREVIOUS RESULT  COMPREHENSIVE METABOLIC PANEL     Status: Abnormal   Collection Time   10/11/11  7:00 AM      Component Value Range Comment   Sodium 129 (*) 135 - 145 (mEq/L)    Potassium 3.9  3.5 - 5.1 (mEq/L)    Chloride 91 (*) 96 - 112 (mEq/L)    CO2 24  19 - 32 (mEq/L)    Glucose, Bld 76  70 - 99 (mg/dL)    BUN 53 (*) 6 - 23 (mg/dL)    Creatinine, Ser 9.56 (*) 0.50 - 1.10 (mg/dL)    Calcium 9.1  8.4 - 10.5 (mg/dL)    Total Protein 5.9 (*) 6.0 - 8.3 (g/dL)    Albumin 1.1 (*) 3.5 - 5.2 (g/dL)    AST 17  0 - 37 (U/L)    ALT 14  0 - 35 (U/L)    Alkaline Phosphatase 235 (*) 39 - 117 (U/L)    Total Bilirubin 0.4  0.3 - 1.2 (mg/dL)    GFR calc non Af Amer 10 (*) >90 (mL/min)    GFR calc Af Amer 11 (*) >90 (mL/min)   GLUCOSE, CAPILLARY     Status: Abnormal   Collection Time   10/11/11  7:54 AM      Component Value Range Comment   Glucose-Capillary 102 (*) 70 - 99 (mg/dL)   GLUCOSE, CAPILLARY  Status: Abnormal   Collection Time   10/11/11 12:09 PM      Component Value Range Comment   Glucose-Capillary 103 (*) 70 - 99 (mg/dL)   GLUCOSE, CAPILLARY     Status: Abnormal   Collection Time   10/11/11  6:15 PM      Component Value Range Comment   Glucose-Capillary  115 (*) 70 - 99 (mg/dL)   GLUCOSE, CAPILLARY     Status: Abnormal   Collection Time   10/11/11  8:22 PM      Component Value Range Comment   Glucose-Capillary 136 (*) 70 - 99 (mg/dL)    Comment 1 Documented in Chart      Comment 2 Notify RN     GLUCOSE, CAPILLARY     Status: Abnormal   Collection Time   10/11/11 11:16 PM      Component Value Range Comment   Glucose-Capillary 147 (*) 70 - 99 (mg/dL)    Comment 1 Documented in Chart      Comment 2 Notify RN     GLUCOSE, CAPILLARY     Status: Abnormal   Collection Time   10/12/11  5:13 AM      Component Value Range Comment   Glucose-Capillary 103 (*) 70 - 99 (mg/dL)    Comment 1 Documented in Chart      Comment 2 Notify RN     VANCOMYCIN, RANDOM     Status: Normal   Collection Time   10/12/11  6:10 AM      Component Value Range Comment   Vancomycin Rm 22.8     CBC     Status: Abnormal   Collection Time   10/12/11  6:10 AM      Component Value Range Comment   WBC 12.5 (*) 4.0 - 10.5 (K/uL)    RBC 3.01 (*) 3.87 - 5.11 (MIL/uL)    Hemoglobin 8.6 (*) 12.0 - 15.0 (g/dL)    HCT 09.8 (*) 11.9 - 46.0 (%)    MCV 86.7  78.0 - 100.0 (fL)    MCH 28.6  26.0 - 34.0 (pg)    MCHC 33.0  30.0 - 36.0 (g/dL)    RDW 14.7 (*) 82.9 - 15.5 (%)    Platelets 58 (*) 150 - 400 (K/uL) PLATELET COUNT CONFIRMED BY SMEAR  COMPREHENSIVE METABOLIC PANEL     Status: Abnormal   Collection Time   10/12/11  6:10 AM      Component Value Range Comment   Sodium 129 (*) 135 - 145 (mEq/L)    Potassium 3.6  3.5 - 5.1 (mEq/L)    Chloride 92 (*) 96 - 112 (mEq/L)    CO2 22  19 - 32 (mEq/L)    Glucose, Bld 108 (*) 70 - 99 (mg/dL)    BUN 50 (*) 6 - 23 (mg/dL)    Creatinine, Ser 5.62 (*) 0.50 - 1.10 (mg/dL)    Calcium 9.0  8.4 - 10.5 (mg/dL)    Total Protein 5.8 (*) 6.0 - 8.3 (g/dL)    Albumin 1.1 (*) 3.5 - 5.2 (g/dL)    AST 19  0 - 37 (U/L)    ALT 13  0 - 35 (U/L)    Alkaline Phosphatase 222 (*) 39 - 117 (U/L)    Total Bilirubin 0.4  0.3 - 1.2 (mg/dL)    GFR calc  non Af Amer 10 (*) >90 (mL/min)    GFR calc Af Amer 12 (*) >90 (mL/min)   GLUCOSE, CAPILLARY  Status: Abnormal   Collection Time   10/12/11  7:51 AM      Component Value Range Comment   Glucose-Capillary 133 (*) 70 - 99 (mg/dL)    Ct Abdomen Pelvis W Contrast  10/10/2011  *RADIOLOGY REPORT*  Clinical Data: Evaluate pseudocyst  CT ABDOMEN AND PELVIS WITH CONTRAST  Technique:  Multidetector CT imaging of the abdomen and pelvis was performed following the standard protocol during bolus administration of intravenous contrast.  Contrast: OMNIPAQUE IOHEXOL 300 MG/ML  SOLN  Comparison: 10/03/2011  Findings: Inflammatory changes about the head of the pancreas as well as edema throughout the peritoneal fat has markedly increased. The portal vein is now thrombosed.  Splenic vein is thrombosed. Portal venous branches throughout the liver are thrombosed. Portal venous gas in the left lobe is noted on image 24 of series 2. There is a small amount of gas in the portal vein at its juncture with the splenic and superior mesenteric veins.  See image 13 of series 5.  On image 35, there is a collection of contrast that extends from the lumen into the lesser sac.  This is worrisome for gastric perforation and spillage of contrast into the lesser sac.  There is a complex mass in the tail of the pancreas previously thought to represent a hemorrhagic pseudocyst that is not significantly changed. It is characterized by a curvilinear areas of enhancement with low density.  Some of these areas of low density are likely cystic and others may represent thrombosed splenic vein. There are gas bubbles within this complex mass which may represent gas in the splenic vein.  There is some pancreatic parenchyma that is enhancing about the thrombosed splenic vein.  Chronic parenchyma atrophy is noted.  The pancreatic head is heterogeneous with areas of low density mixed with enhancing parenchyma.  This finding is felt to represent  borderline necrosis.  1.4 x 2.9 cm soft tissue density anterior to the celiac axis may represent an enlarged node.  Other borderline enlarged left periaortic nodes are seen in the abdomen.  Moderate ascites surrounding the spleen and liver.  Post cholecystectomy.  Stable appearance of the kidneys and adrenal glands.  Large amount of free fluid in the pelvis.  Bladder, uterus, and adnexa are within normal limits.  Bibasilar atelectasis verses airspace disease. Small pleural effusions.  Severe cardiomegaly.  Peritoneal dialysis catheter in the left lower quadrant is stable.  IMPRESSION: Gastric perforation with spillage of oral contrast into the lesser sac.  Portal and splenic veins are thrombosed.  There is gas within the thrombus worrisome for septic thrombophlebitis.  Portal venous gas in the liver is also present.  Increased pancreatic inflammatory change as well as edema within the peritoneal fat.  Increased ascites  An element of pancreatic necrosis is suspected given the poor enhancement the pancreas.  Large mass in the tail of the pancreas of unknown significance.  It is heterogeneous and likely contains thrombosed splenic vein with gas.  It is probably an inflammatory mass at this point.  Mild abnormal adenopathy as described.  Bibasilar atelectasis worse airspace disease and small bilateral effusions.  Critical Value/emergent results were called by telephone at the time of interpretation on 10/10/2011  at 1750 hours  to  Dr. Venetia Constable, who verbally acknowledged these results.  Original Report Authenticated By: Donavan Burnet, M.D.    Assessment/Plan ESRD Bacteremia Gastric perforation from spontaneous drainage of pancreatic cyst to stomach. Will proceed with VasCath today for HD needs. Discussed with pt but  family will need to consent due to confusion.  Brayton El PA-C 10/12/2011, 11:14 AM

## 2011-10-12 NOTE — Progress Notes (Signed)
Patient ID: [name], female   DOB: September 29, 1950, 61 y.o.   MRN: [id number]    Subjective: Pt is confused.  C/o some abdominal pain this morning.  No nausea.  Objective: Vital signs in last 24 hours: Temp:  [97.5 F (36.4 C)-98.2 F (36.8 C)] 98.2 F (36.8 C) (04/29 0520) Pulse Rate:  [83-91] 84  (04/29 0520) Resp:  [16-18] 18  (04/29 0520) BP: (124-138)/(79-91) 132/91 mmHg (04/29 0520) SpO2:  [96 %-100 %] 100 % (04/29 0520) Weight:  [148 lb 12.8 oz (67.495 kg)] 148 lb 12.8 oz (67.495 kg) (04/28 2029) Last BM Date: 10/11/11  Intake/Output from previous day: 04/28 0701 - 04/29 0700 In: 406 [I.V.:306; IV Piggyback:100] Out: -  Intake/Output this shift:    PE: Abd: soft, tender in epigastrium, some BS, ND, currently getting her peritoneal dialysis  Lab Results:   Basename 10/12/11 0610 10/11/11 0700  WBC 12.5* 13.0*  HGB 8.6* 8.8*  HCT 26.1* 26.5*  PLT PENDING 48*   BMET  Basename 10/12/11 0610 10/11/11 0700  NA 129* 129*  K 3.6 3.9  CL 92* 91*  CO2 22 24  GLUCOSE 108* 76  BUN 50* 53*  CREATININE 4.34* 4.47*  CALCIUM 9.0 9.1   PT/INR  Basename 10/10/11 2109  LABPROT 18.1*  INR 1.47   CMP     Component Value Date/Time   NA 129* 10/12/2011 0610   K 3.6 10/12/2011 0610   CL 92* 10/12/2011 0610   CO2 22 10/12/2011 0610   GLUCOSE 108* 10/12/2011 0610   BUN 50* 10/12/2011 0610   CREATININE 4.34* 10/12/2011 0610   CALCIUM 9.0 10/12/2011 0610   PROT 5.8* 10/12/2011 0610   ALBUMIN 1.1* 10/12/2011 0610   AST 19 10/12/2011 0610   ALT 13 10/12/2011 0610   ALKPHOS 222* 10/12/2011 0610   BILITOT 0.4 10/12/2011 0610   GFRNONAA 10* 10/12/2011 0610   GFRAA 12* 10/12/2011 0610   Lipase     Component Value Date/Time   LIPASE 15 10/08/2011 0500       Studies/Results: Ct Abdomen Pelvis W Contrast  10/10/2011  *RADIOLOGY REPORT*  Clinical Data: Evaluate pseudocyst  CT ABDOMEN AND PELVIS WITH CONTRAST  Technique:  Multidetector CT imaging of the abdomen and pelvis was  performed following the standard protocol during bolus administration of intravenous contrast.  Contrast: OMNIPAQUE IOHEXOL 300 MG/ML  SOLN  Comparison: 10/03/2011  Findings: Inflammatory changes about the head of the pancreas as well as edema throughout the peritoneal fat has markedly increased. The portal vein is now thrombosed.  Splenic vein is thrombosed. Portal venous branches throughout the liver are thrombosed. Portal venous gas in the left lobe is noted on image 24 of series 2. There is a small amount of gas in the portal vein at its juncture with the splenic and superior mesenteric veins.  See image 13 of series 5.  On image 35, there is a collection of contrast that extends from the lumen into the lesser sac.  This is worrisome for gastric perforation and spillage of contrast into the lesser sac.  There is a complex mass in the tail of the pancreas previously thought to represent a hemorrhagic pseudocyst that is not significantly changed. It is characterized by a curvilinear areas of enhancement with low density.  Some of these areas of low density are likely cystic and others may represent thrombosed splenic vein. There are gas bubbles within this complex mass which may represent gas in the splenic vein.  There  is some pancreatic parenchyma that is enhancing about the thrombosed splenic vein.  Chronic parenchyma atrophy is noted.  The pancreatic head is heterogeneous with areas of low density mixed with enhancing parenchyma.  This finding is felt to represent borderline necrosis.  1.4 x 2.9 cm soft tissue density anterior to the celiac axis may represent an enlarged node.  Other borderline enlarged left periaortic nodes are seen in the abdomen.  Moderate ascites surrounding the spleen and liver.  Post cholecystectomy.  Stable appearance of the kidneys and adrenal glands.  Large amount of free fluid in the pelvis.  Bladder, uterus, and adnexa are within normal limits.  Bibasilar atelectasis verses  airspace disease. Small pleural effusions.  Severe cardiomegaly.  Peritoneal dialysis catheter in the left lower quadrant is stable.  IMPRESSION: Gastric perforation with spillage of oral contrast into the lesser sac.  Portal and splenic veins are thrombosed.  There is gas within the thrombus worrisome for septic thrombophlebitis.  Portal venous gas in the liver is also present.  Increased pancreatic inflammatory change as well as edema within the peritoneal fat.  Increased ascites  An element of pancreatic necrosis is suspected given the poor enhancement the pancreas.  Large mass in the tail of the pancreas of unknown significance.  It is heterogeneous and likely contains thrombosed splenic vein with gas.  It is probably an inflammatory mass at this point.  Mild abnormal adenopathy as described.  Bibasilar atelectasis worse airspace disease and small bilateral effusions.  Critical Value/emergent results were called by telephone at the time of interpretation on 10/10/2011  at 1750 hours  to  Dr. Venetia Constable, who verbally acknowledged these results.  Original Report Authenticated By: Donavan Burnet, M.D.    Anti-infectives: Anti-infectives     Start     Dose/Rate Route Frequency Ordered Stop   10/11/11 1200   meropenem (MERREM) 500 mg in sodium chloride 0.9 % 50 mL IVPB        500 mg 100 mL/hr over 30 Minutes Intravenous Every 24 hours 10/11/11 1138     10/09/11 1400   fluconazole (DIFLUCAN) tablet 100 mg  Status:  Discontinued        100 mg Oral Daily 10/09/11 1237 10/09/11 1312   10/09/11 1400   micafungin (MYCAMINE) 100 mg in sodium chloride 0.9 % 100 mL IVPB        100 mg 100 mL/hr over 1 Hours Intravenous Every 24 hours 10/09/11 1313     10/09/11 1300   cefTAZidime (FORTAZ) 500 mg in dextrose 5 % 50 mL IVPB  Status:  Discontinued        500 mg 100 mL/hr over 30 Minutes Intravenous Every 24 hours 10/09/11 0918 10/11/11 1028   10/08/11 1300   cefTAZidime (FORTAZ) 1 g in dextrose 5 % 50 mL IVPB   Status:  Discontinued        1 g 100 mL/hr over 30 Minutes Intravenous Every 24 hours 10/08/11 1151 10/09/11 0917   10/07/11 1000   piperacillin-tazobactam (ZOSYN) IVPB 2.25 g  Status:  Discontinued        2.25 g 100 mL/hr over 30 Minutes Intravenous 3 times per day 10/07/11 0927 10/08/11 1145   10/07/11 0930   vancomycin (VANCOCIN) 1,750 mg in sodium chloride 0.9 % 500 mL IVPB        1,750 mg 250 mL/hr over 120 Minutes Intravenous  Once 10/07/11 0927 10/07/11 1241   10/05/11 0300   vancomycin (VANCOCIN) 1,750 mg in sodium chloride 0.9 %  500 mL IVPB        1,750 mg 250 mL/hr over 120 Minutes Intravenous  Once 10/05/11 0204 10/05/11 0500   10/04/11 0300   Ampicillin-Sulbactam (UNASYN) 3 g in sodium chloride 0.9 % 100 mL IVPB  Status:  Discontinued     Comments: Unasyn 3 g IV q24h on Peritoneal Dialysis      3 g 100 mL/hr over 60 Minutes Intravenous Daily at bedtime 10/04/11 0151 10/07/11 0922           Assessment/Plan  1. Gastric perforation likely secondary to perforation of pseudocyst into stomach 2. Hypoalbuminemia 3. ESRD 4. Thrombocytopenia 5. Multiple medical problems  Plan: 1. As per Dr. Allene Pyo note, it appears the extravasation of contrast is from her cyst being drained into her stomach.  This is similar to a procedure that GI can do called a cystgastrostomy.  Her peritoneal dialysis is still clean and uncontaminated which goes along with the perforation draining into her stomach and not her abdominal cavity.  The patient is not a good surgical candidate.  Would continue conservative management for now.   LOS: 9 days    OSBORNE,KELLY E 10/12/2011 She seems to be feeling better. She is not a good surgical candidate so if this will resolve nonoperatively, she will be better off. Her abdomen is benign today on exam with mild upper abdominal tenderness, no peritonitis.

## 2011-10-12 NOTE — Progress Notes (Signed)
Pt was off the floor between 14:00 and 16:00 receiving a temporary HD cath, therefore she did not receive her second PD treatment until 16:30. Plan to do her third treatment at 19:30, and her fourth at 22:30.

## 2011-10-12 NOTE — Progress Notes (Signed)
ANTIBIOTIC CONSULT NOTE - FOLLOW UP  Pharmacy Consult for Vancomycin + Meropenem Indication: MRSA + GNR growing in blood cultures  Allergies  Allergen Reactions  . Morphine And Related Anaphylaxis  . Codeine Itching  . Lisinopril     Language changed    Patient Measurements: Height: 5\' 4"  (162.6 cm) Weight: 148 lb 12.8 oz (67.495 kg) IBW/kg (Calculated) : 54.7   Vital Signs: Temp: 98.2 F (36.8 C) (04/29 0520) Temp src: Oral (04/29 0520) BP: 142/99 mmHg (04/29 0828) Pulse Rate: 92  (04/29 0828) Intake/Output from previous day: 04/28 0701 - 04/29 0700 In: 406 [I.V.:306; IV Piggyback:100] Out: -  Intake/Output from this shift:    Labs:  Basename 10/12/11 0610 10/11/11 0700 10/10/11 1008  WBC 12.5* 13.0* 10.0  HGB 8.6* 8.8* 8.2*  PLT 58* 48* 37*  LABCREA -- -- --  CREATININE 4.34* 4.47* 4.37*   Estimated Creatinine Clearance: 12.7 ml/min (by C-G formula based on Cr of 4.34).  Basename 10/12/11 0610 10/10/11 0610  VANCOTROUGH -- --  VANCOPEAK -- --  VANCORANDOM 22.8 30.0  GENTTROUGH -- --  GENTPEAK -- --  GENTRANDOM -- --  TOBRATROUGH -- --  TOBRAPEAK -- --  TOBRARND -- --  AMIKACINPEAK -- --  AMIKACINTROU -- --  AMIKACIN -- --     Microbiology: Recent Results (from the past 720 hour(s))  MRSA PCR SCREENING     Status: Normal   Collection Time   09/14/11  8:49 AM      Component Value Range Status Comment   MRSA by PCR NEGATIVE  NEGATIVE  Final   CULTURE, BLOOD (ROUTINE X 2)     Status: Normal   Collection Time   10/04/11  2:15 AM      Component Value Range Status Comment   Specimen Description BLOOD RIGHT FINGER   Final    Special Requests BOTTLES DRAWN AEROBIC ONLY [facility]   Final    Culture  Setup Time [id number]   Final    Culture     Final    Value: METHICILLIN RESISTANT STAPHYLOCOCCUS AUREUS     Note: RIFAMPIN AND GENTAMICIN SHOULD NOT BE USED AS SINGLE DRUGS FOR TREATMENT OF STAPH INFECTIONS. This organism DOES NOT demonstrate inducible  Clindamycin resistance in vitro. CRITICAL RESULT CALLED TO, READ BACK BY AND VERIFIED WITH: JENAE M @ 1430      10/06/11 BY KRAWS     Note: Gram Stain Report Called to,Read Back By and Verified With: Elsie Lincoln RN on 10/05/11 at 00:35 by Christie Nottingham   Report Status 10/07/2011 FINAL   Final    Organism ID, Bacteria METHICILLIN RESISTANT STAPHYLOCOCCUS AUREUS   Final   CULTURE, BLOOD (ROUTINE X 2)     Status: Normal   Collection Time   10/04/11  2:30 AM      Component Value Range Status Comment   Specimen Description BLOOD RIGHT HAND   Final    Special Requests BOTTLES DRAWN AEROBIC ONLY 5CC   Final    Culture  Setup Time [id number]   Final    Culture     Final    Value: STAPHYLOCOCCUS AUREUS     Note: SUSCEPTIBILITIES PERFORMED ON PREVIOUS CULTURE WITHIN THE LAST 5 DAYS.     Note: Gram Stain Report Called to,Read Back By and Verified With: Elsie Lincoln RN on 10/05/11 at 00:35 by Christie Nottingham   Report Status 10/07/2011 FINAL   Final   BODY FLUID CULTURE     Status: Normal  Collection Time   10/04/11  6:45 AM      Component Value Range Status Comment   Specimen Description PERITONEAL FLUID   Final    Special Requests 1 CUP   Final    Gram Stain     Final    Value: WBC PRESENT, PREDOMINANTLY PMN     NO ORGANISMS SEEN   Culture NO GROWTH 3 DAYS   Final    Report Status 10/07/2011 FINAL   Final   CULTURE, BLOOD (ROUTINE X 2)     Status: Normal (Preliminary result)   Collection Time   10/07/11  6:20 PM      Component Value Range Status Comment   Specimen Description BLOOD RIGHT HAND   Final    Special Requests BOTTLES DRAWN AEROBIC ONLY 1.5CC   Final    Culture  Setup Time [id number]   Final    Culture     Final    Value: YEAST     Note: Gram Stain Report Called to,Read Back By and Verified With: Bernie Covey RN on 10/11/11 at 04:16 by Christie Nottingham   Report Status PENDING   Incomplete   CULTURE, BLOOD (ROUTINE X 2)     Status: Normal (Preliminary result)   Collection Time   10/07/11   6:32 PM      Component Value Range Status Comment   Specimen Description BLOOD RIGHT ARM   Final    Special Requests BOTTLES DRAWN AEROBIC AND ANAEROBIC 10CC   Final    Culture  Setup Time [id number]   Final    Culture     Final    Value: GRAM NEGATIVE RODS     YEAST     Note: Gram Stain Report Called to,Read Back By and Verified With: ANN MCCRAW @1155  10/09/11 BY KRAWS Gram Stain Report Called to,Read Back By and Verified With: RN L. RIVERS ON 10/10/11 AT 1645 BY TEDAR   Report Status PENDING   Incomplete   CULTURE, BLOOD (ROUTINE X 2)     Status: Normal (Preliminary result)   Collection Time   10/10/11  6:10 AM      Component Value Range Status Comment   Specimen Description BLOOD RIGHT HAND   Final    Special Requests BOTTLES DRAWN AEROBIC ONLY 10CC   Final    Culture  Setup Time [id number]   Final    Culture     Final    Value:        BLOOD CULTURE RECEIVED NO GROWTH TO DATE CULTURE WILL BE HELD FOR 5 DAYS BEFORE ISSUING A FINAL NEGATIVE REPORT   Report Status PENDING   Incomplete   CULTURE, BLOOD (ROUTINE X 2)     Status: Normal (Preliminary result)   Collection Time   10/10/11  6:15 AM      Component Value Range Status Comment   Specimen Description BLOOD LEFT ARM   Final    Special Requests BOTTLES DRAWN AEROBIC ONLY 10CC   Final    Culture  Setup Time [id number]   Final    Culture     Final    Value:        BLOOD CULTURE RECEIVED NO GROWTH TO DATE CULTURE WILL BE HELD FOR 5 DAYS BEFORE ISSUING A FINAL NEGATIVE REPORT   Report Status PENDING   Incomplete     Anti-infectives     Start     Dose/Rate Route Frequency Ordered Stop   10/11/11 1200   meropenem (MERREM) 500  mg in sodium chloride 0.9 % 50 mL IVPB        500 mg 100 mL/hr over 30 Minutes Intravenous Every 24 hours 10/11/11 1138     10/09/11 1400   fluconazole (DIFLUCAN) tablet 100 mg  Status:  Discontinued        100 mg Oral Daily 10/09/11 1237 10/09/11 1312   10/09/11 1400   micafungin (MYCAMINE) 100 mg in  sodium chloride 0.9 % 100 mL IVPB        100 mg 100 mL/hr over 1 Hours Intravenous Every 24 hours 10/09/11 1313     10/09/11 1300   cefTAZidime (FORTAZ) 500 mg in dextrose 5 % 50 mL IVPB  Status:  Discontinued        500 mg 100 mL/hr over 30 Minutes Intravenous Every 24 hours 10/09/11 0918 10/11/11 1028   10/08/11 1300   cefTAZidime (FORTAZ) 1 g in dextrose 5 % 50 mL IVPB  Status:  Discontinued        1 g 100 mL/hr over 30 Minutes Intravenous Every 24 hours 10/08/11 1151 10/09/11 0917   10/07/11 1000   piperacillin-tazobactam (ZOSYN) IVPB 2.25 g  Status:  Discontinued        2.25 g 100 mL/hr over 30 Minutes Intravenous 3 times per day 10/07/11 0927 10/08/11 1145   10/07/11 0930   vancomycin (VANCOCIN) 1,750 mg in sodium chloride 0.9 % 500 mL IVPB        1,750 mg 250 mL/hr over 120 Minutes Intravenous  Once 10/07/11 0927 10/07/11 1241   10/05/11 0300   vancomycin (VANCOCIN) 1,750 mg in sodium chloride 0.9 % 500 mL IVPB        1,750 mg 250 mL/hr over 120 Minutes Intravenous  Once 10/05/11 0204 10/05/11 0500   10/04/11 0300   Ampicillin-Sulbactam (UNASYN) 3 g in sodium chloride 0.9 % 100 mL IVPB  Status:  Discontinued     Comments: Unasyn 3 g IV q24h on Peritoneal Dialysis      3 g 100 mL/hr over 60 Minutes Intravenous Daily at bedtime 10/04/11 0151 10/07/11 4098          Assessment: 61 y.o. F on Vancomycin/Meropenem/Micafungin for empiric MRSA + GNR + yeast growing in blood cultures in the setting of peritoneal dialysis, perforated viscus, and abdominal abscess. The patient is currently receiving peritoneal dialysis however she may be transitioning to hemodialysis (if she agrees). A random Vancomycin level this morning is 22.8 mcg/mL -- therefore she does not require any additional doses at this time. We will continue to monitor plans for PD vs. HD and re-dose the Vancomycin at that time. If the patient goes for HD today -- then a dose will likely be required this evening. If the  patient continues on PD -- she will likely require a dose tomorrow a.m.    Pharmacy Monitoring:  Anticoagulation: None PTA, SCDs for VTE px. Plts~ 58 << 48 Infectious Disease: Vanc Rx#8 (4/22 >> current) + Micafungin MD#4 (4/26 >> current) + Meropenem MD#2 (4/28 >> current) for MRSA bacteremia/candidemia in the setting of perforated viscus/intrabdominal abscess. Afebrile, WBC 12.5 << 13. PD likely transitioning to HD. Random Vanc 4/29: 22.8  BCx(4/21): MRSA (S to Vanc, MIC~1) BCx (4/24): yeast + GNR (pending)  Vancomycin (4/22, 4/24 >> ) Ceftazidime (4/25 >> 4/27) Zosyn (4/24 >> 4/25)  Cardiovascular: Hx CAD(CABG)/ICM(20%)/HTN/DL/CHF. BP/24h: 120-140/80-100, HR: 80-90. On ranexa, simvastatin (PTA coreg, doxazosin) Endocrinology: Hx DM. CBGs/24h: 102-147. 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 Chronic abdominal pain and worsened in acute illness (Hx gallstone pancreatitis). Not a surgical candidate at this time -- considering removal of PD cath, likely not to pursue TEE at this time. LFTs WNL, Alk Phos trending down, albumin 1.1. On resource tid, protonix po Neurology: Chronic abdominal pain worsened in the acute situation. Pain score~8-10. On prn dilaudid Nephrology: Hx ESRD on PD. On CAPD receiving five 2L exchanges/24h. Likely to attempt to transition to HD if the patient will agree. No residual UOP charted. Corr Ca~12.4, no Phos. On renal vitamin, renvela Hematology / Oncology: Anemia of CKD. Hgb 8.6. On Aranesp (Sun) Best Practices: SCDs, home meds addressed  Goal of Therapy:  Vancomycin trough of 12-20 mcg/mL  Plan:  1. Increase Meropenem to 1000 mg every 24 hours 2. Will continue to monitor plans for PD and HD in regards to additional Vancomycin doses  Georgina Pillion, PharmD, BCPS Clinical Pharmacist Pager:  [phone] 10/12/2011 9:37 AM

## 2011-10-12 NOTE — Progress Notes (Signed)
Subjective: Now growing GNR from culture with yeast , repeat blood cultures negative   Antibiotics:  Anti-infectives     Start     Dose/Rate Route Frequency Ordered Stop   10/12/11 2200   meropenem (MERREM) 1 g in sodium chloride 0.9 % 100 mL IVPB        1 g 200 mL/hr over 30 Minutes Intravenous Every 24 hours 10/12/11 0939     10/11/11 1200   meropenem (MERREM) 500 mg in sodium chloride 0.9 % 50 mL IVPB  Status:  Discontinued        500 mg 100 mL/hr over 30 Minutes Intravenous Every 24 hours 10/11/11 1138 10/12/11 0939   10/09/11 1400   fluconazole (DIFLUCAN) tablet 100 mg  Status:  Discontinued        100 mg Oral Daily 10/09/11 1237 10/09/11 1312   10/09/11 1400   micafungin (MYCAMINE) 100 mg in sodium chloride 0.9 % 100 mL IVPB        100 mg 100 mL/hr over 1 Hours Intravenous Every 24 hours 10/09/11 1313     10/09/11 1300   cefTAZidime (FORTAZ) 500 mg in dextrose 5 % 50 mL IVPB  Status:  Discontinued        500 mg 100 mL/hr over 30 Minutes Intravenous Every 24 hours 10/09/11 0918 10/11/11 1028   10/08/11 1300   cefTAZidime (FORTAZ) 1 g in dextrose 5 % 50 mL IVPB  Status:  Discontinued        1 g 100 mL/hr over 30 Minutes Intravenous Every 24 hours 10/08/11 1151 10/09/11 0917   10/07/11 1000   piperacillin-tazobactam (ZOSYN) IVPB 2.25 g  Status:  Discontinued        2.25 g 100 mL/hr over 30 Minutes Intravenous 3 times per day 10/07/11 0927 10/08/11 1145   10/07/11 0930   vancomycin (VANCOCIN) 1,750 mg in sodium chloride 0.9 % 500 mL IVPB        1,750 mg 250 mL/hr over 120 Minutes Intravenous  Once 10/07/11 0927 10/07/11 1241   10/05/11 0300   vancomycin (VANCOCIN) 1,750 mg in sodium chloride 0.9 % 500 mL IVPB        1,750 mg 250 mL/hr over 120 Minutes Intravenous  Once 10/05/11 0204 10/05/11 0500   10/04/11 0300   Ampicillin-Sulbactam (UNASYN) 3 g in sodium chloride 0.9 % 100 mL IVPB  Status:  Discontinued     Comments: Unasyn 3 g IV q24h on Peritoneal Dialysis      3  g 100 mL/hr over 60 Minutes Intravenous Daily at bedtime 10/04/11 0151 10/07/11 0922          Medications: Scheduled Meds:    . darbepoetin (ARANESP) injection - NON-DIALYSIS  40 mcg Subcutaneous Q Sun-1800  . feeding supplement  1 Container Oral TID BM  . insulin aspart  0-9 Units Subcutaneous QID  . meropenem (MERREM) IV  1 g Intravenous Q24H  . micafungin (MYCAMINE) IV  100 mg Intravenous Q24H  . multivitamin  1 tablet Oral QHS  . pantoprazole  40 mg Oral Q1200  . ranolazine  500 mg Oral Daily  . sevelamer  3,200 mg Oral TID WC  . simvastatin  10 mg Oral q1800  . sodium chloride  3 mL Intravenous Q12H  . DISCONTD: insulin aspart  0-9 Units Subcutaneous QID  . DISCONTD: meropenem (MERREM) IV  500 mg Intravenous Q24H   Continuous Infusions:    . dextrose 5 % and 0.9% NaCl 10 mL (10/10/11 2148)  .  dialysis solution 1.5% low-MG/low-CA     PRN Meds:.acetaminophen, acetaminophen, heparin, HYDROmorphone, hydrOXYzine, ondansetron (ZOFRAN) IV, ondansetron   Objective: Weight change: -3 lb 5.1 oz (-1.505 kg)  Intake/Output Summary (Last 24 hours) at 10/12/11 1148 Last data filed at 10/12/11 0829  Gross per 24 hour  Intake    406 ml  Output      0 ml  Net    406 ml   Blood pressure 129/89, pulse 78, temperature 98.2 F (36.8 C), temperature source Oral, resp. rate 20, height 5\' 4"  (1.626 m), weight 148 lb 12.8 oz (67.495 kg), SpO2 97.00%. Temp:  [97.5 F (36.4 C)-98.2 F (36.8 C)] 98.2 F (36.8 C) (04/29 0520) Pulse Rate:  [78-92] 78  (04/29 1108) Resp:  [16-20] 20  (04/29 0828) BP: (124-142)/(79-99) 129/89 mmHg (04/29 1108) SpO2:  [96 %-100 %] 97 % (04/29 0828) Weight:  [148 lb 12.8 oz (67.495 kg)] 148 lb 12.8 oz (67.495 kg) (04/28 2029)  Physical Exam: General: Much more awake and alert but still not fully oriented  General: alert oriented, getting PD  Lab Results:  Basename 10/12/11 0610 10/11/11 0700  WBC 12.5* 13.0*  HGB 8.6* 8.8*  HCT 26.1* 26.5*  PLT  58* 48*    BMET  Basename 10/12/11 0610 10/11/11 0700  NA 129* 129*  K 3.6 3.9  CL 92* 91*  CO2 22 24  GLUCOSE 108* 76  BUN 50* 53*  CREATININE 4.34* 4.47*  CALCIUM 9.0 9.1    Micro Results: Recent Results (from the past 240 hour(s))  CULTURE, BLOOD (ROUTINE X 2)     Status: Normal   Collection Time   10/04/11  2:15 AM      Component Value Range Status Comment   Specimen Description BLOOD RIGHT FINGER   Final    Special Requests BOTTLES DRAWN AEROBIC ONLY [facility]   Final    Culture  Setup Time [id number]   Final    Culture     Final    Value: METHICILLIN RESISTANT STAPHYLOCOCCUS AUREUS     Note: RIFAMPIN AND GENTAMICIN SHOULD NOT BE USED AS SINGLE DRUGS FOR TREATMENT OF STAPH INFECTIONS. This organism DOES NOT demonstrate inducible Clindamycin resistance in vitro. CRITICAL RESULT CALLED TO, READ BACK BY AND VERIFIED WITH: JENAE M @ 1430      10/06/11 BY KRAWS     Note: Gram Stain Report Called to,Read Back By and Verified With: Elsie Lincoln RN on 10/05/11 at 00:35 by Christie Nottingham   Report Status 10/07/2011 FINAL   Final    Organism ID, Bacteria METHICILLIN RESISTANT STAPHYLOCOCCUS AUREUS   Final   CULTURE, BLOOD (ROUTINE X 2)     Status: Normal   Collection Time   10/04/11  2:30 AM      Component Value Range Status Comment   Specimen Description BLOOD RIGHT HAND   Final    Special Requests BOTTLES DRAWN AEROBIC ONLY 5CC   Final    Culture  Setup Time [id number]   Final    Culture     Final    Value: STAPHYLOCOCCUS AUREUS     Note: SUSCEPTIBILITIES PERFORMED ON PREVIOUS CULTURE WITHIN THE LAST 5 DAYS.     Note: Gram Stain Report Called to,Read Back By and Verified With: Elsie Lincoln RN on 10/05/11 at 00:35 by Christie Nottingham   Report Status 10/07/2011 FINAL   Final   BODY FLUID CULTURE     Status: Normal   Collection Time   10/04/11  6:45 AM  Component Value Range Status Comment   Specimen Description PERITONEAL FLUID   Final    Special Requests 1 CUP   Final    Gram Stain      Final    Value: WBC PRESENT, PREDOMINANTLY PMN     NO ORGANISMS SEEN   Culture NO GROWTH 3 DAYS   Final    Report Status 10/07/2011 FINAL   Final   CULTURE, BLOOD (ROUTINE X 2)     Status: Normal (Preliminary result)   Collection Time   10/07/11  6:20 PM      Component Value Range Status Comment   Specimen Description BLOOD RIGHT HAND   Final    Special Requests BOTTLES DRAWN AEROBIC ONLY 1.5CC   Final    Culture  Setup Time [id number]   Final    Culture     Final    Value: YEAST     Note: Gram Stain Report Called to,Read Back By and Verified With: Bernie Covey RN on 10/11/11 at 04:16 by Christie Nottingham   Report Status PENDING   Incomplete   CULTURE, BLOOD (ROUTINE X 2)     Status: Normal (Preliminary result)   Collection Time   10/07/11  6:32 PM      Component Value Range Status Comment   Specimen Description BLOOD RIGHT ARM   Final    Special Requests BOTTLES DRAWN AEROBIC AND ANAEROBIC 10CC   Final    Culture  Setup Time [id number]   Final    Culture     Final    Value: GRAM NEGATIVE RODS     YEAST     Note: Gram Stain Report Called to,Read Back By and Verified With: ANN MCCRAW @1155  10/09/11 BY KRAWS Gram Stain Report Called to,Read Back By and Verified With: RN L. RIVERS ON 10/10/11 AT 1645 BY TEDAR   Report Status PENDING   Incomplete   CULTURE, BLOOD (ROUTINE X 2)     Status: Normal (Preliminary result)   Collection Time   10/10/11  6:10 AM      Component Value Range Status Comment   Specimen Description BLOOD RIGHT HAND   Final    Special Requests BOTTLES DRAWN AEROBIC ONLY 10CC   Final    Culture  Setup Time [id number]   Final    Culture     Final    Value:        BLOOD CULTURE RECEIVED NO GROWTH TO DATE CULTURE WILL BE HELD FOR 5 DAYS BEFORE ISSUING A FINAL NEGATIVE REPORT   Report Status PENDING   Incomplete   CULTURE, BLOOD (ROUTINE X 2)     Status: Normal (Preliminary result)   Collection Time   10/10/11  6:15 AM      Component Value Range Status Comment    Specimen Description BLOOD LEFT ARM   Final    Special Requests BOTTLES DRAWN AEROBIC ONLY 10CC   Final    Culture  Setup Time [id number]   Final    Culture     Final    Value:        BLOOD CULTURE RECEIVED NO GROWTH TO DATE CULTURE WILL BE HELD FOR 5 DAYS BEFORE ISSUING A FINAL NEGATIVE REPORT   Report Status PENDING   Incomplete     Studies/Results: Ct Abdomen Pelvis W Contrast  10/10/2011  *RADIOLOGY REPORT*  Clinical Data: Evaluate pseudocyst  CT ABDOMEN AND PELVIS WITH CONTRAST  Technique:  Multidetector CT imaging of the abdomen and pelvis was performed  following the standard protocol during bolus administration of intravenous contrast.  Contrast: OMNIPAQUE IOHEXOL 300 MG/ML  SOLN  Comparison: 10/03/2011  Findings: Inflammatory changes about the head of the pancreas as well as edema throughout the peritoneal fat has markedly increased. The portal vein is now thrombosed.  Splenic vein is thrombosed. Portal venous branches throughout the liver are thrombosed. Portal venous gas in the left lobe is noted on image 24 of series 2. There is a small amount of gas in the portal vein at its juncture with the splenic and superior mesenteric veins.  See image 13 of series 5.  On image 35, there is a collection of contrast that extends from the lumen into the lesser sac.  This is worrisome for gastric perforation and spillage of contrast into the lesser sac.  There is a complex mass in the tail of the pancreas previously thought to represent a hemorrhagic pseudocyst that is not significantly changed. It is characterized by a curvilinear areas of enhancement with low density.  Some of these areas of low density are likely cystic and others may represent thrombosed splenic vein. There are gas bubbles within this complex mass which may represent gas in the splenic vein.  There is some pancreatic parenchyma that is enhancing about the thrombosed splenic vein.  Chronic parenchyma atrophy is noted.  The  pancreatic head is heterogeneous with areas of low density mixed with enhancing parenchyma.  This finding is felt to represent borderline necrosis.  1.4 x 2.9 cm soft tissue density anterior to the celiac axis may represent an enlarged node.  Other borderline enlarged left periaortic nodes are seen in the abdomen.  Moderate ascites surrounding the spleen and liver.  Post cholecystectomy.  Stable appearance of the kidneys and adrenal glands.  Large amount of free fluid in the pelvis.  Bladder, uterus, and adnexa are within normal limits.  Bibasilar atelectasis verses airspace disease. Small pleural effusions.  Severe cardiomegaly.  Peritoneal dialysis catheter in the left lower quadrant is stable.  IMPRESSION: Gastric perforation with spillage of oral contrast into the lesser sac.  Portal and splenic veins are thrombosed.  There is gas within the thrombus worrisome for septic thrombophlebitis.  Portal venous gas in the liver is also present.  Increased pancreatic inflammatory change as well as edema within the peritoneal fat.  Increased ascites  An element of pancreatic necrosis is suspected given the poor enhancement the pancreas.  Large mass in the tail of the pancreas of unknown significance.  It is heterogeneous and likely contains thrombosed splenic vein with gas.  It is probably an inflammatory mass at this point.  Mild abnormal adenopathy as described.  Bibasilar atelectasis worse airspace disease and small bilateral effusions.  Critical Value/emergent results were called by telephone at the time of interpretation on 10/10/2011  at 1750 hours  to  Dr. Venetia Constable, who verbally acknowledged these results.  Original Report Authenticated By: Donavan Burnet, M.D.      Assessment/Plan: [name] is a 61 y.o. female with CAD, ischemic cardiomyopathy and ICD,  ESRD on PD with MRSA bacteremia , candidemia, GNR bacteremia now found to have perforated duodenum/stomach with intrabdominal abscess, portal vein  thrombosis and ? Pancreatic malignancy  1) Perforated viscus: Reviewed films with radiology this am. PD catheter NOT clearly communicating with her abdominal process. Temporary HD catheter being placed today  -she remains on broad spectrum merrem, vancomycin and mycafungin -ideally PD catheter should come out --no surgery planned at [facility] for present,  [facility] following closely  2) MRSA bacteremia: D/w Dr. Anne Fu and did not feel urgen to push for TEE in context of perforated stomach, etc. I DO worry about her ICD being infected  --add RIFAMPIN to her vancomycin --need EP doctor to comment on her need for ICD --as she stablizes should get TEE to help guide decision re ICD removal --she is going need minimym of 6 weeks of IV vancomycin and rifampin so long as ICD remains  -- vancomycin --repeat blood cultures clearing finally --TEE could be helpful though ruling in or out endocarditis appears least of her problems at this point  3) Candidemia --continue mycafungin --Repeat blood cultures pending --Patient will need dedicated a funduscopic exam by ophthalmology to exclude endophthalmitis.  4) GNR in blood: ID pending    LOS: 9 days   Acey Lav 10/12/2011, 11:48 AM

## 2011-10-12 NOTE — Procedures (Signed)
Procedure:  Right IJ temp HD cath Findings:  20 cm Trialysis cath placed via right IJ vein.  Tip in RA.  No PTX.  OK to use.

## 2011-10-12 NOTE — Progress Notes (Signed)
Aware of referral for Tunneled HD cath to transition to HD from PD. Pt chart reviewed and seems conflicting. Pt had PD this am, which was still clean and uncontaminated. ID recs PD cath out and HD cath. Pt previously refused HD but apparently agreed to it in discussion with ID. (+)MRSA blood culture from 4/21, repeats pending. Pt is somewhat confused this am and I'm not sure we could trust her judgement. With ongoing bacteremia, would also likely place VasCath instead of tunneled cath. Surgery apparently not planning PD cath removal at this point. Will keep NPO for possible procedure today but please contact IR dept to confirm treatment decision.  Brayton El PA-C 10/12/2011 10:16 AM

## 2011-10-12 NOTE — Progress Notes (Signed)
S:Co abd feeling full O:BP 142/99  Pulse 92  Temp(Src) 98.2 F (36.8 C) (Oral)  Resp 20  Ht 5\' 4"  (1.626 m)  Wt 67.495 kg (148 lb 12.8 oz)  BMI 25.54 kg/m2  SpO2 97%  Intake/Output Summary (Last 24 hours) at 10/12/11 1100 Last data filed at 10/12/11 0829  Gross per 24 hour  Intake    406 ml  Output      0 ml  Net    406 ml   Weight change: -1.505 kg (-3 lb 5.1 oz) ZOX:WRUEA and alert CVS:RRR Resp:Basilar crackles Abd:+bs mild diffuse tenderness mild distention Ext:no edema NEURO:Slightly confused, Ox2      . darbepoetin (ARANESP) injection - NON-DIALYSIS  40 mcg Subcutaneous Q Sun-1800  . feeding supplement  1 Container Oral TID BM  . insulin aspart  0-9 Units Subcutaneous QID  . meropenem (MERREM) IV  1 g Intravenous Q24H  . micafungin (MYCAMINE) IV  100 mg Intravenous Q24H  . multivitamin  1 tablet Oral QHS  . pantoprazole  40 mg Oral Q1200  . ranolazine  500 mg Oral Daily  . sevelamer  3,200 mg Oral TID WC  . simvastatin  10 mg Oral q1800  . sodium chloride  3 mL Intravenous Q12H  . DISCONTD: insulin aspart  0-9 Units Subcutaneous QID  . DISCONTD: meropenem (MERREM) IV  500 mg Intravenous Q24H   Ct Abdomen Pelvis W Contrast  10/10/2011  *RADIOLOGY REPORT*  Clinical Data: Evaluate pseudocyst  CT ABDOMEN AND PELVIS WITH CONTRAST  Technique:  Multidetector CT imaging of the abdomen and pelvis was performed following the standard protocol during bolus administration of intravenous contrast.  Contrast: OMNIPAQUE IOHEXOL 300 MG/ML  SOLN  Comparison: 10/03/2011  Findings: Inflammatory changes about the head of the pancreas as well as edema throughout the peritoneal fat has markedly increased. The portal vein is now thrombosed.  Splenic vein is thrombosed. Portal venous branches throughout the liver are thrombosed. Portal venous gas in the left lobe is noted on image 24 of series 2. There is a small amount of gas in the portal vein at its juncture with the splenic and  superior mesenteric veins.  See image 13 of series 5.  On image 35, there is a collection of contrast that extends from the lumen into the lesser sac.  This is worrisome for gastric perforation and spillage of contrast into the lesser sac.  There is a complex mass in the tail of the pancreas previously thought to represent a hemorrhagic pseudocyst that is not significantly changed. It is characterized by a curvilinear areas of enhancement with low density.  Some of these areas of low density are likely cystic and others may represent thrombosed splenic vein. There are gas bubbles within this complex mass which may represent gas in the splenic vein.  There is some pancreatic parenchyma that is enhancing about the thrombosed splenic vein.  Chronic parenchyma atrophy is noted.  The pancreatic head is heterogeneous with areas of low density mixed with enhancing parenchyma.  This finding is felt to represent borderline necrosis.  1.4 x 2.9 cm soft tissue density anterior to the celiac axis may represent an enlarged node.  Other borderline enlarged left periaortic nodes are seen in the abdomen.  Moderate ascites surrounding the spleen and liver.  Post cholecystectomy.  Stable appearance of the kidneys and adrenal glands.  Large amount of free fluid in the pelvis.  Bladder, uterus, and adnexa are within normal limits.  Bibasilar atelectasis verses  airspace disease. Small pleural effusions.  Severe cardiomegaly.  Peritoneal dialysis catheter in the left lower quadrant is stable.  IMPRESSION: Gastric perforation with spillage of oral contrast into the lesser sac.  Portal and splenic veins are thrombosed.  There is gas within the thrombus worrisome for septic thrombophlebitis.  Portal venous gas in the liver is also present.  Increased pancreatic inflammatory change as well as edema within the peritoneal fat.  Increased ascites  An element of pancreatic necrosis is suspected given the poor enhancement the pancreas.  Large  mass in the tail of the pancreas of unknown significance.  It is heterogeneous and likely contains thrombosed splenic vein with gas.  It is probably an inflammatory mass at this point.  Mild abnormal adenopathy as described.  Bibasilar atelectasis worse airspace disease and small bilateral effusions.  Critical Value/emergent results were called by telephone at the time of interpretation on 10/10/2011  at 1750 hours  to  Dr. Venetia Constable, who verbally acknowledged these results.  Original Report Authenticated By: Donavan Burnet, M.D.   BMET    Component Value Date/Time   NA 129* 10/12/2011 0610   K 3.6 10/12/2011 0610   CL 92* 10/12/2011 0610   CO2 22 10/12/2011 0610   GLUCOSE 108* 10/12/2011 0610   BUN 50* 10/12/2011 0610   CREATININE 4.34* 10/12/2011 0610   CALCIUM 9.0 10/12/2011 0610   GFRNONAA 10* 10/12/2011 0610   GFRAA 12* 10/12/2011 0610   CBC    Component Value Date/Time   WBC 12.5* 10/12/2011 0610   WBC 6.7 04/08/2011 1054   RBC 3.01* 10/12/2011 0610   RBC 3.40* 04/08/2011 1054   HGB 8.6* 10/12/2011 0610   HGB 10.1* 04/08/2011 1054   HCT 26.1* 10/12/2011 0610   HCT 30.1* 04/08/2011 1054   PLT 58* 10/12/2011 0610   PLT 232 04/08/2011 1054   MCV 86.7 10/12/2011 0610   MCV 88.6 04/08/2011 1054   MCH 28.6 10/12/2011 0610   MCH 29.7 04/08/2011 1054   MCHC 33.0 10/12/2011 0610   MCHC 33.5 04/08/2011 1054   RDW 18.4* 10/12/2011 0610   RDW 20.4* 04/08/2011 1054   LYMPHSABS 0.5* 10/07/2011 0600   LYMPHSABS 1.4 04/08/2011 1054   MONOABS 1.1* 10/07/2011 0600   MONOABS 0.4 04/08/2011 1054   EOSABS 0.0 10/07/2011 0600   EOSABS 0.2 04/08/2011 1054   BASOSABS 0.0 10/07/2011 0600   BASOSABS 0.0 04/08/2011 1054     Assessment: 1. ESRD on PD with plans to change to HD 2. Candidemia 3 Perforated stomach 4. Pancreatic mass ?inflammatory 5. Thrombocytopenia 6. Cardiomyapthy  Plan: 1. To go for temp HD cath today 2. Cont PD today but use 2.5% bag for now 3. PLan HD in am 4. [facility] working [facility]  getting [facility] to [facility]

## 2011-10-12 NOTE — H&P (Signed)
Agree.  For temp HD cath placement today. 

## 2011-10-12 NOTE — Progress Notes (Signed)
Appreciate IR/CCS/ID/Renal. Patient says she needs   SUBJECTIVE Says she needs pain meds "now, now".   1. Pancreatic pseudocyst   2. Epigastric abdominal pain   3. End stage renal disease     Past Medical History  Diagnosis Date  . Gallstone pancreatitis   . Coronary artery disease     s/p CABG 2008 with multiple PCIs  . Ischemic cardiomyopathy   . End stage renal disease   . Diabetes mellitus   . Hypertension   . History of nonadherence to medical treatment   . LBBB (left bundle branch block)   . Hyperlipidemia   . Renal insufficiency   . CHF (congestive heart failure)   . Dialysis patient   . Anemia    Current Facility-Administered Medications  Medication Dose Route Frequency Provider Last Rate Last Dose  . acetaminophen (TYLENOL) tablet 650 mg  650 mg Oral Q6H PRN Ron Parker, MD   650 mg at 10/10/11 0348   Or  . acetaminophen (TYLENOL) suppository 650 mg  650 mg Rectal Q6H PRN Ron Parker, MD      . darbepoetin (ARANESP) injection 40 mcg  40 mcg Subcutaneous Q Sun-1800 Sheffield Slider, PA   40 mcg at 10/11/11 2107  . dextrose 5 %-0.9 % sodium chloride infusion   Intravenous Continuous Eller Sweis, MD 10 mL/hr at 10/10/11 2148 10 mL at 10/10/11 2148  . dialysis solution 1.5% low-MG/low-CA (DELFLEX) CAPD   Intraperitoneal Continuous Garnetta Buddy, MD      . feeding supplement (RESOURCE BREEZE) liquid 1 Container  1 Container Oral TID BM Sheffield Slider, PA   1 Container at 10/10/11 1536  . heparin injection 500 Units  500 Units Intraperitoneal PRN Zada Girt, MD   1,000 Units at 10/11/11 2120  . HYDROmorphone (DILAUDID) injection 1 mg  1 mg Intravenous Q4H PRN Adriane Gabbert, MD      . hydrOXYzine (ATARAX/VISTARIL) tablet 10 mg  10 mg Oral Q12H PRN [place] Cyndie Mull, MD      . insulin aspart (novoLOG) injection 0-9 Units  0-9 Units Subcutaneous QID Leena Tiede, MD      . meropenem (MERREM) 1 g in sodium chloride 0.9 % 100 mL IVPB  1 g Intravenous  Q24H Ann Held, PHARMD      . micafungin [facility]) 100 mg in sodium chloride 0.9 % 100 mL IVPB  100 mg Intravenous Q24H Randall Hiss, MD   100 mg at 10/11/11 1600  . multivitamin (RENA-VIT) tablet 1 tablet  1 tablet Oral QHS Rhetta Mura, MD   1 tablet at 10/09/11 2158  . ondansetron (ZOFRAN) tablet 4 mg  4 mg Oral Q6H PRN Ron Parker, MD       Or  . ondansetron (ZOFRAN) injection 4 mg  4 mg Intravenous Q6H PRN Ron Parker, MD   4 mg at 10/04/11 0415  . pantoprazole (PROTONIX) EC tablet 40 mg  40 mg Oral Q1200 Crystal Stillinger Robertson, PHARMD   40 mg at 10/10/11 1250  . ranolazine (RANEXA) 12 hr tablet 500 mg  500 mg Oral Daily Ron Parker, MD   500 mg at 10/10/11 1052  . rifampin (RIFADIN) 300 mg in sodium chloride 0.9 % 100 mL IVPB  300 mg Intravenous Q12H Randall Hiss, MD      . sevelamer (RENVELA) tablet 3,200 mg  3,200 mg Oral TID WC Karly Pitter, MD   3,200 mg at 10/10/11 1802  .  simvastatin (ZOCOR) tablet 10 mg  10 mg Oral q1800 Ron Parker, MD   10 mg at 10/11/11 1812  . sodium chloride 0.9 % injection 3 mL  3 mL Intravenous Q12H Ron Parker, MD   3 mL at 10/10/11 1051  . DISCONTD: HYDROmorphone (DILAUDID) injection 0.5 mg  0.5 mg Intravenous Q4H PRN Viren Lebeau, MD   0.5 mg at 10/12/11 0913  . DISCONTD: insulin aspart (novoLOG) injection 0-9 Units  0-9 Units Subcutaneous QID Yancey Pedley, MD   1 Units at 10/12/11 0000  . DISCONTD: meropenem (MERREM) 500 mg in sodium chloride 0.9 % 50 mL IVPB  500 mg Intravenous Q24H Mayuri Staples, MD   500 mg at 10/11/11 1248   Allergies  Allergen Reactions  . Morphine And Related Anaphylaxis  . Codeine Itching  . Lisinopril     Language changed   Principal Problem:  *Epigastric abdominal pain Active Problems:  Ischemic cardiomyopathy  Chronic systolic congestive heart failure  End stage renal disease  Pancreatic pseudocyst  Pancreatitis chronic  Hyperkalemia   Hyponatremia   Vital signs in last 24 hours: Temp:  [97.5 F (36.4 C)-98.2 F (36.8 C)] 98.2 F (36.8 C) (04/29 0520) Pulse Rate:  [78-92] 90  (04/29 1311) Resp:  [16-20] 17  (04/29 1311) BP: (124-142)/(79-99) 135/90 mmHg (04/29 1311) SpO2:  [96 %-100 %] 98 % (04/29 1311) Weight:  [67.495 kg (148 lb 12.8 oz)] 67.495 kg (148 lb 12.8 oz) (04/28 2029) Weight change: -1.505 kg (-3 lb 5.1 oz) Last BM Date: 10/11/11  Intake/Output from previous day: 04/28 0701 - 04/29 0700 In: 406 [I.V.:306; IV Piggyback:100] Out: -  Intake/Output this shift: Total I/O In: 0  Out: 150 [Urine:150]  Lab Results:  Basename 10/12/11 0610 10/11/11 0700  WBC 12.5* 13.0*  HGB 8.6* 8.8*  HCT 26.1* 26.5*  PLT 58* 48*   BMET  Basename 10/12/11 0610 10/11/11 0700  NA 129* 129*  K 3.6 3.9  CL 92* 91*  CO2 22 24  GLUCOSE 108* 76  BUN 50* 53*  CREATININE 4.34* 4.47*  CALCIUM 9.0 9.1    Studies/Results: Ct Abdomen Pelvis W Contrast  10/10/2011  *RADIOLOGY REPORT*  Clinical Data: Evaluate pseudocyst  CT ABDOMEN AND PELVIS WITH CONTRAST  Technique:  Multidetector CT imaging of the abdomen and pelvis was performed following the standard protocol during bolus administration of intravenous contrast.  Contrast: OMNIPAQUE IOHEXOL 300 MG/ML  SOLN  Comparison: 10/03/2011  Findings: Inflammatory changes about the head of the pancreas as well as edema throughout the peritoneal fat has markedly increased. The portal vein is now thrombosed.  Splenic vein is thrombosed. Portal venous branches throughout the liver are thrombosed. Portal venous gas in the left lobe is noted on image 24 of series 2. There is a small amount of gas in the portal vein at its juncture with the splenic and superior mesenteric veins.  See image 13 of series 5.  On image 35, there is a collection of contrast that extends from the lumen into the lesser sac.  This is worrisome for gastric perforation and spillage of contrast into the lesser  sac.  There is a complex mass in the tail of the pancreas previously thought to represent a hemorrhagic pseudocyst that is not significantly changed. It is characterized by a curvilinear areas of enhancement with low density.  Some of these areas of low density are likely cystic and others may represent thrombosed splenic vein. There are gas bubbles within this complex  mass which may represent gas in the splenic vein.  There is some pancreatic parenchyma that is enhancing about the thrombosed splenic vein.  Chronic parenchyma atrophy is noted.  The pancreatic head is heterogeneous with areas of low density mixed with enhancing parenchyma.  This finding is felt to represent borderline necrosis.  1.4 x 2.9 cm soft tissue density anterior to the celiac axis may represent an enlarged node.  Other borderline enlarged left periaortic nodes are seen in the abdomen.  Moderate ascites surrounding the spleen and liver.  Post cholecystectomy.  Stable appearance of the kidneys and adrenal glands.  Large amount of free fluid in the pelvis.  Bladder, uterus, and adnexa are within normal limits.  Bibasilar atelectasis verses airspace disease. Small pleural effusions.  Severe cardiomegaly.  Peritoneal dialysis catheter in the left lower quadrant is stable.  IMPRESSION: Gastric perforation with spillage of oral contrast into the lesser sac.  Portal and splenic veins are thrombosed.  There is gas within the thrombus worrisome for septic thrombophlebitis.  Portal venous gas in the liver is also present.  Increased pancreatic inflammatory change as well as edema within the peritoneal fat.  Increased ascites  An element of pancreatic necrosis is suspected given the poor enhancement the pancreas.  Large mass in the tail of the pancreas of unknown significance.  It is heterogeneous and likely contains thrombosed splenic vein with gas.  It is probably an inflammatory mass at this point.  Mild abnormal adenopathy as described.  Bibasilar  atelectasis worse airspace disease and small bilateral effusions.  Critical Value/emergent results were called by telephone at the time of interpretation on 10/10/2011  at 1750 hours  to  Dr. Venetia Constable, who verbally acknowledged these results.  Original Report Authenticated By: Donavan Burnet, M.D.    Medications: I have reviewed the patient's current medications.   Physical exam GENERAL- alert HEAD- normal atraumatic, no neck masses, normal thyroid, no jvd RESPIRATORY- appears well, vitals normal, no respiratory distress, acyanotic, normal RR, ear and throat exam is normal, neck free of mass or lymphadenopathy, chest clear, no wheezing, crepitations, rhonchi, normal symmetric air entry CVS- regular rate and rhythm, S1, S2 normal, no murmur, click, rub or gallop ABDOMEN- Pd catheter in place. Tenderness to deep palpation diffusely. NEURO- Grossly normal EXTREMITIES- extremities normal, atraumatic, no cyanosis or edema  Plan  * MRSA bacteremia/Fungemia/Gram negative bacteremia- Appreciate ID. On micafungin/vanc/meropenem. *Perforated Viscus- appreciate surgery. Difficult situation. patient says she would want to proceed with surgery, if an option. I have explained that she is a very high risk candidate..  * Ischemic cardiomyopathy/ Chronic systolic congestive heart failure(20%)- stable.  * End stage renal disease-on PD. Plans for Hd, removal of Pd cath.  * Pancreatic pseudocyst/ Pancreatitis chronic- tolerating feeding, refusing ngt. ?drain pseudocyst  * Anemia of chronic kidney disease- stable. Received unit of blood.  *Thrombocytopenia- may be related to ongoing infection. Monitor.  * encephalopathy- likely related to infection. Has improved, finally.  Discussed with patient's daughter yesterday. She says family would like everything possible to be done.  Dvt/gi prophylaxis.    [name] 10/12/2011 1:19 PM Pager: [phone].

## 2011-10-13 ENCOUNTER — Inpatient Hospital Stay (HOSPITAL_COMMUNITY): Payer: Medicare Other

## 2011-10-13 DIAGNOSIS — A4159 Other Gram-negative sepsis: Secondary | ICD-10-CM | POA: Diagnosis present

## 2011-10-13 DIAGNOSIS — D696 Thrombocytopenia, unspecified: Secondary | ICD-10-CM | POA: Diagnosis present

## 2011-10-13 DIAGNOSIS — R7881 Bacteremia: Secondary | ICD-10-CM | POA: Diagnosis present

## 2011-10-13 DIAGNOSIS — K631 Perforation of intestine (nontraumatic): Secondary | ICD-10-CM | POA: Diagnosis present

## 2011-10-13 DIAGNOSIS — D631 Anemia in chronic kidney disease: Secondary | ICD-10-CM | POA: Diagnosis present

## 2011-10-13 DIAGNOSIS — N189 Chronic kidney disease, unspecified: Secondary | ICD-10-CM | POA: Diagnosis present

## 2011-10-13 DIAGNOSIS — B49 Unspecified mycosis: Secondary | ICD-10-CM | POA: Diagnosis present

## 2011-10-13 LAB — GLUCOSE, CAPILLARY: Glucose-Capillary: 86 mg/dL (ref 70–99)

## 2011-10-13 LAB — COMPREHENSIVE METABOLIC PANEL
AST: 21 U/L (ref 0–37)
BUN: 45 mg/dL — ABNORMAL HIGH (ref 6–23)
CO2: 24 mEq/L (ref 19–32)
Calcium: 8.8 mg/dL (ref 8.4–10.5)
Creatinine, Ser: 4.37 mg/dL — ABNORMAL HIGH (ref 0.50–1.10)
GFR calc non Af Amer: 10 mL/min — ABNORMAL LOW (ref >90)

## 2011-10-13 LAB — CBC
HCT: 27.2 % — ABNORMAL LOW (ref 36.0–46.0)
MCH: 27.7 pg (ref 26.0–34.0)
MCV: 85.5 fL (ref 78.0–100.0)
Platelets: 102 10*3/uL — ABNORMAL LOW (ref 150–400)
RBC: 3.18 MIL/uL — ABNORMAL LOW (ref 3.87–5.11)
RDW: 18 % — ABNORMAL HIGH (ref 11.5–15.5)

## 2011-10-13 LAB — PROTIME-INR
INR: 1.26 (ref 0.00–1.49)
Prothrombin Time: 16.1 seconds — ABNORMAL HIGH (ref 11.6–15.2)

## 2011-10-13 LAB — PHOSPHORUS: Phosphorus: 4.4 mg/dL (ref 2.3–4.6)

## 2011-10-13 MED ORDER — PANTOPRAZOLE SODIUM 40 MG IV SOLR
40.0000 mg | INTRAVENOUS | Status: DC
  Administered 2011-10-13: 40 mg via INTRAVENOUS
  Filled 2011-10-13 (×2): qty 40

## 2011-10-13 MED ORDER — VANCOMYCIN HCL 1000 MG IV SOLR
750.0000 mg | INTRAVENOUS | Status: AC
  Administered 2011-10-13: 750 mg via INTRAVENOUS
  Filled 2011-10-13: qty 750

## 2011-10-13 MED ORDER — HYDROMORPHONE HCL PF 1 MG/ML IJ SOLN
INTRAMUSCULAR | Status: AC
  Administered 2011-10-13: 1 mg via INTRAVENOUS
  Filled 2011-10-13: qty 1

## 2011-10-13 MED ORDER — DARBEPOETIN ALFA-POLYSORBATE 100 MCG/0.5ML IJ SOLN
INTRAMUSCULAR | Status: AC
  Filled 2011-10-13: qty 0.5

## 2011-10-13 MED ORDER — DARBEPOETIN ALFA-POLYSORBATE 100 MCG/0.5ML IJ SOLN
100.0000 ug | INTRAMUSCULAR | Status: DC
  Administered 2011-10-13: 100 ug via INTRAVENOUS
  Filled 2011-10-13: qty 0.5

## 2011-10-13 MED ORDER — ACETAMINOPHEN 325 MG PO TABS
ORAL_TABLET | ORAL | Status: AC
  Filled 2011-10-13: qty 2

## 2011-10-13 NOTE — Progress Notes (Signed)
ANTIBIOTIC CONSULT NOTE - FOLLOW UP  Pharmacy Consult for Vancomycin + Meropenem Indication: MRSA + GNR growing in blood cultures  Allergies  Allergen Reactions  . Morphine And Related Anaphylaxis  . Codeine Itching  . Lisinopril     Language changed    Patient Measurements: Height: 5\' 4"  (162.6 cm) Weight: 141 lb 12.1 oz (64.3 kg) IBW/kg (Calculated) : 54.7   Vital Signs: Temp: 97 F (36.1 C) (04/30 0645) Temp src: Oral (04/30 0645) BP: 97/74 mmHg (04/30 1003) Pulse Rate: 95  (04/30 1003) Intake/Output from previous day: 04/29 0701 - 04/30 0700 In: 496.7 [I.V.:296.7; IV Piggyback:200] Out: 150 [Urine:150] Intake/Output from this shift:    Labs:  Basename 10/13/11 0600 10/12/11 0610 10/11/11 0700  WBC 13.0* 12.5* 13.0*  HGB 8.8* 8.6* 8.8*  PLT 102* 58* 48*  LABCREA -- -- --  CREATININE 4.37* 4.34* 4.47*   Estimated Creatinine Clearance: 11.5 ml/min (by C-G formula based on Cr of 4.37).  Basename 10/12/11 0610  VANCOTROUGH --  Leodis Binet --  VANCORANDOM 22.8  GENTTROUGH --  GENTPEAK --  GENTRANDOM --  TOBRATROUGH --  TOBRAPEAK --  TOBRARND --  AMIKACINPEAK --  AMIKACINTROU --  AMIKACIN --     Microbiology: Recent Results (from the past 720 hour(s))  MRSA PCR SCREENING     Status: Normal   Collection Time   09/14/11  8:49 AM      Component Value Range Status Comment   MRSA by PCR NEGATIVE  NEGATIVE  Final   CULTURE, BLOOD (ROUTINE X 2)     Status: Normal   Collection Time   10/04/11  2:15 AM      Component Value Range Status Comment   Specimen Description BLOOD RIGHT FINGER   Final    Special Requests BOTTLES DRAWN AEROBIC ONLY [facility]   Final    Culture  Setup Time [id number]   Final    Culture     Final    Value: METHICILLIN RESISTANT STAPHYLOCOCCUS AUREUS     Note: RIFAMPIN AND GENTAMICIN SHOULD NOT BE USED AS SINGLE DRUGS FOR TREATMENT OF STAPH INFECTIONS. This organism DOES NOT demonstrate inducible Clindamycin resistance in vitro. CRITICAL RESULT  CALLED TO, READ BACK BY AND VERIFIED WITH: JENAE M @ 1430      10/06/11 BY KRAWS     Note: Gram Stain Report Called to,Read Back By and Verified With: Elsie Lincoln RN on 10/05/11 at 00:35 by Christie Nottingham   Report Status 10/07/2011 FINAL   Final    Organism ID, Bacteria METHICILLIN RESISTANT STAPHYLOCOCCUS AUREUS   Final   CULTURE, BLOOD (ROUTINE X 2)     Status: Normal   Collection Time   10/04/11  2:30 AM      Component Value Range Status Comment   Specimen Description BLOOD RIGHT HAND   Final    Special Requests BOTTLES DRAWN AEROBIC ONLY 5CC   Final    Culture  Setup Time [id number]   Final    Culture     Final    Value: STAPHYLOCOCCUS AUREUS     Note: SUSCEPTIBILITIES PERFORMED ON PREVIOUS CULTURE WITHIN THE LAST 5 DAYS.     Note: Gram Stain Report Called to,Read Back By and Verified With: Elsie Lincoln RN on 10/05/11 at 00:35 by Christie Nottingham   Report Status 10/07/2011 FINAL   Final   BODY FLUID CULTURE     Status: Normal   Collection Time   10/04/11  6:45 AM      Component  Value Range Status Comment   Specimen Description PERITONEAL FLUID   Final    Special Requests 1 CUP   Final    Gram Stain     Final    Value: WBC PRESENT, PREDOMINANTLY PMN     NO ORGANISMS SEEN   Culture NO GROWTH 3 DAYS   Final    Report Status 10/07/2011 FINAL   Final   CULTURE, BLOOD (ROUTINE X 2)     Status: Normal (Preliminary result)   Collection Time   10/07/11  6:20 PM      Component Value Range Status Comment   Specimen Description BLOOD RIGHT HAND   Final    Special Requests BOTTLES DRAWN AEROBIC ONLY 1.5CC   Final    Culture  Setup Time [id number]   Final    Culture     Final    Value: YEAST     Note: Gram Stain Report Called to,Read Back By and Verified With: Bernie Covey RN on 10/11/11 at 04:16 by Christie Nottingham   Report Status PENDING   Incomplete   CULTURE, BLOOD (ROUTINE X 2)     Status: Normal (Preliminary result)   Collection Time   10/07/11  6:32 PM      Component Value Range Status  Comment   Specimen Description BLOOD RIGHT ARM   Final    Special Requests BOTTLES DRAWN AEROBIC AND ANAEROBIC 10CC   Final    Culture  Setup Time [id number]   Final    Culture     Final    Value: ENTEROBACTER CLOACAE     CANDIDA GLABRATA     Note: Gram Stain Report Called to,Read Back By and Verified With: ANN MCCRAW @1155  10/09/11 BY KRAWS Gram Stain Report Called to,Read Back By and Verified With: RN L. RIVERS ON 10/10/11 AT 1645 BY TEDAR   Report Status PENDING   Incomplete   CULTURE, BLOOD (ROUTINE X 2)     Status: Normal (Preliminary result)   Collection Time   10/10/11  6:10 AM      Component Value Range Status Comment   Specimen Description BLOOD RIGHT HAND   Final    Special Requests BOTTLES DRAWN AEROBIC ONLY 10CC   Final    Culture  Setup Time [id number]   Final    Culture     Final    Value:        BLOOD CULTURE RECEIVED NO GROWTH TO DATE CULTURE WILL BE HELD FOR 5 DAYS BEFORE ISSUING A FINAL NEGATIVE REPORT   Report Status PENDING   Incomplete   CULTURE, BLOOD (ROUTINE X 2)     Status: Normal (Preliminary result)   Collection Time   10/10/11  6:15 AM      Component Value Range Status Comment   Specimen Description BLOOD LEFT ARM   Final    Special Requests BOTTLES DRAWN AEROBIC ONLY 10CC   Final    Culture  Setup Time [id number]   Final    Culture     Final    Value:        BLOOD CULTURE RECEIVED NO GROWTH TO DATE CULTURE WILL BE HELD FOR 5 DAYS BEFORE ISSUING A FINAL NEGATIVE REPORT   Report Status PENDING   Incomplete     Anti-infectives     Start     Dose/Rate Route Frequency Ordered Stop   10/13/11 1200   vancomycin (VANCOCIN) 750 mg in sodium chloride 0.9 % 150 mL IVPB  750 mg 150 mL/hr over 60 Minutes Intravenous Every Tue (Hemodialysis) 10/13/11 1000 10/20/11 1159   10/12/11 2200   meropenem (MERREM) 1 g in sodium chloride 0.9 % 100 mL IVPB        1 g 200 mL/hr over 30 Minutes Intravenous Every 24 hours 10/12/11 0939     10/12/11 1300   rifampin  (RIFADIN) 300 mg in sodium chloride 0.9 % 100 mL IVPB        300 mg 200 mL/hr over 30 Minutes Intravenous Every 12 hours 10/12/11 1154     10/11/11 1200   meropenem (MERREM) 500 mg in sodium chloride 0.9 % 50 mL IVPB  Status:  Discontinued        500 mg 100 mL/hr over 30 Minutes Intravenous Every 24 hours 10/11/11 1138 10/12/11 0939   10/09/11 1400   fluconazole (DIFLUCAN) tablet 100 mg  Status:  Discontinued        100 mg Oral Daily 10/09/11 1237 10/09/11 1312   10/09/11 1400   micafungin (MYCAMINE) 100 mg in sodium chloride 0.9 % 100 mL IVPB        100 mg 100 mL/hr over 1 Hours Intravenous Every 24 hours 10/09/11 1313     10/09/11 1300   cefTAZidime (FORTAZ) 500 mg in dextrose 5 % 50 mL IVPB  Status:  Discontinued        500 mg 100 mL/hr over 30 Minutes Intravenous Every 24 hours 10/09/11 0918 10/11/11 1028   10/08/11 1300   cefTAZidime (FORTAZ) 1 g in dextrose 5 % 50 mL IVPB  Status:  Discontinued        1 g 100 mL/hr over 30 Minutes Intravenous Every 24 hours 10/08/11 1151 10/09/11 0917   10/07/11 1000   piperacillin-tazobactam (ZOSYN) IVPB 2.25 g  Status:  Discontinued        2.25 g 100 mL/hr over 30 Minutes Intravenous 3 times per day 10/07/11 0927 10/08/11 1145   10/07/11 0930   vancomycin (VANCOCIN) 1,750 mg in sodium chloride 0.9 % 500 mL IVPB        1,750 mg 250 mL/hr over 120 Minutes Intravenous  Once 10/07/11 0927 10/07/11 1241   10/05/11 0300   vancomycin (VANCOCIN) 1,750 mg in sodium chloride 0.9 % 500 mL IVPB        1,750 mg 250 mL/hr over 120 Minutes Intravenous  Once 10/05/11 0204 10/05/11 0500   10/04/11 0300   Ampicillin-Sulbactam (UNASYN) 3 g in sodium chloride 0.9 % 100 mL IVPB  Status:  Discontinued     Comments: Unasyn 3 g IV q24h on Peritoneal Dialysis      3 g 100 mL/hr over 60 Minutes Intravenous Daily at bedtime 10/04/11 0151 10/07/11 2956          Assessment: 61 y.o. F on Vancomycin/Meropenem/Micafungin for empiric MRSA + GNR + yeast growing  in blood cultures in the setting of peritoneal dialysis, perforated viscus, and abdominal abscess. The patient transitioned from PD to HD this a.m and is currently tolerating her HD session at a BFR 300-400 cc/min. Her estimated Vancomycin level prior to HD is around 20 mcg/mL -- therefore we will give a full post-HD maintenance dose of Vancomycin. No additional HD sessions have been scheduled at this time -- we will continue to monitor plans and check levels to determine when to give additional Vancomycin doses.   Pharmacy Monitoring:  Anticoagulation: None PTA, SCDs for VTE px. Plts~ 102 << 58 Infectious Disease: Vanc Rx#9 (4/22 >> current) +  Rifampin (4/30 >>current) MD#1 + Micafungin MD#5 (4/26 >> current) + Meropenem MD#3 (4/28 >> current) for MRSA bacteremia/candidemia in the setting of perforated viscus/intrabdominal abscess. Afebrile, WBC 13 << 12.5. Transitioned to HD this a.m, level likely around 20 prior to HD and tolerated well so will enter full post-HD dose.   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 BP/24h: 100-140/80-100, HR: 80-100. On ranexa, simvastatin (PTA coreg, doxazosin) Endocrinology: Hx DM. CBGs/24h: 102-147. 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 Chronic abdominal pain and worsened in acute illness (Hx gallstone pancreatitis). Not a surgical candidate at this time -- [facility]?), likely not to pursue TEE at this time. LFTs WNL, Alk Phos up today, albumin 1.2. On resource tid, protonix po Neurology: Chronic abdominal pain. Pain score/24h~3-8. On prn dilaudid Nephrology: Hx ESRD on PD PTA and transitioned to HD on 4/30. Getting  PD cath removed d/t infection. ~3 hrs into 4 hr session at BFR 300-400 cc/min. Some residual UOP noted. Corr Ca~11.1, Phos 4.4. On renal vitamin, renvela Hematology / Oncology: Anemia of CKD. Hgb 8.8. Aranesp dose increased and moved to Tuesday. Received 40 mcg of Aranesp on Sunday and another 100 mcg again today Best Practices: SCDs, home meds addressed  Goal of Therapy:  Pre-HD Vancomycin level of 15-25 mcg/mL  Plan:  1. Vancomycin 750 mg IV x 1 dose post HD today.  2. Continue Meropenem to 1000 mg every 24 hours 3. Will continue to follow HD schedule/duration for scheduling of additional Vancomycin doses.   Georgina Pillion, PharmD, BCPS Clinical Pharmacist Pager: [phone] 10/13/2011 10:15 AM

## 2011-10-13 NOTE — Progress Notes (Signed)
10/13/2011 patient had blood in stool this evening and DR Venetia Constable is aware. Biochemist, clinical.

## 2011-10-13 NOTE — Progress Notes (Signed)
Subjective: Still co abdominal pain   Antibiotics:  Anti-infectives     Start     Dose/Rate Route Frequency Ordered Stop   10/13/11 1200   vancomycin (VANCOCIN) 750 mg in sodium chloride 0.9 % 150 mL IVPB        750 mg 150 mL/hr over 60 Minutes Intravenous Every Tue (Hemodialysis) 10/13/11 1000 10/13/11 1114   10/12/11 2200   meropenem (MERREM) 1 g in sodium chloride 0.9 % 100 mL IVPB        1 g 200 mL/hr over 30 Minutes Intravenous Every 24 hours 10/12/11 0939     10/12/11 1300   rifampin (RIFADIN) 300 mg in sodium chloride 0.9 % 100 mL IVPB        300 mg 200 mL/hr over 30 Minutes Intravenous Every 12 hours 10/12/11 1154     10/11/11 1200   meropenem (MERREM) 500 mg in sodium chloride 0.9 % 50 mL IVPB  Status:  Discontinued        500 mg 100 mL/hr over 30 Minutes Intravenous Every 24 hours 10/11/11 1138 10/12/11 0939   10/09/11 1400   fluconazole (DIFLUCAN) tablet 100 mg  Status:  Discontinued        100 mg Oral Daily 10/09/11 1237 10/09/11 1312   10/09/11 1400   micafungin (MYCAMINE) 100 mg in sodium chloride 0.9 % 100 mL IVPB        100 mg 100 mL/hr over 1 Hours Intravenous Every 24 hours 10/09/11 1313     10/09/11 1300   cefTAZidime (FORTAZ) 500 mg in dextrose 5 % 50 mL IVPB  Status:  Discontinued        500 mg 100 mL/hr over 30 Minutes Intravenous Every 24 hours 10/09/11 0918 10/11/11 1028   10/08/11 1300   cefTAZidime (FORTAZ) 1 g in dextrose 5 % 50 mL IVPB  Status:  Discontinued        1 g 100 mL/hr over 30 Minutes Intravenous Every 24 hours 10/08/11 1151 10/09/11 0917   10/07/11 1000   piperacillin-tazobactam (ZOSYN) IVPB 2.25 g  Status:  Discontinued        2.25 g 100 mL/hr over 30 Minutes Intravenous 3 times per day 10/07/11 0927 10/08/11 1145   10/07/11 0930   vancomycin (VANCOCIN) 1,750 mg in sodium chloride 0.9 % 500 mL IVPB        1,750 mg 250 mL/hr over 120 Minutes Intravenous  Once 10/07/11 0927 10/07/11 1241   10/05/11 0300   vancomycin (VANCOCIN) 1,750  mg in sodium chloride 0.9 % 500 mL IVPB        1,750 mg 250 mL/hr over 120 Minutes Intravenous  Once 10/05/11 0204 10/05/11 0500   10/04/11 0300   Ampicillin-Sulbactam (UNASYN) 3 g in sodium chloride 0.9 % 100 mL IVPB  Status:  Discontinued     Comments: Unasyn 3 g IV q24h on Peritoneal Dialysis      3 g 100 mL/hr over 60 Minutes Intravenous Daily at bedtime 10/04/11 0151 10/07/11 0922          Medications: Scheduled Meds:    . acetaminophen      . darbepoetin      . darbepoetin (ARANESP) injection - DIALYSIS  100 mcg Intravenous Q Tue-HD  . feeding supplement  1 Container Oral TID BM  . heparin      . insulin aspart  0-9 Units Subcutaneous QID  . meropenem (MERREM) IV  1 g Intravenous Q24H  . micafungin (MYCAMINE) IV  100  mg Intravenous Q24H  . multivitamin  1 tablet Oral QHS  . pantoprazole  40 mg Oral Q1200  . ranolazine  500 mg Oral Daily  . rifampin (RIFADIN) IVPB  300 mg Intravenous Q12H  . sevelamer  3,200 mg Oral TID WC  . simvastatin  10 mg Oral q1800  . sodium chloride  3 mL Intravenous Q12H  . vancomycin  750 mg Intravenous Q Tue-HD  . DISCONTD: darbepoetin (ARANESP) injection - NON-DIALYSIS  40 mcg Subcutaneous Q Sun-1800   Continuous Infusions:    . dextrose 5 % and 0.9% NaCl 10 mL/hr at 10/12/11 1500  . dialysis solution 1.5% low-MG/low-CA     PRN Meds:.acetaminophen, acetaminophen, heparin, HYDROmorphone, hydrOXYzine, ondansetron (ZOFRAN) IV, ondansetron   Objective: Weight change: -3.4 oz (-0.095 kg)  Intake/Output Summary (Last 24 hours) at 10/13/11 1419 Last data filed at 10/13/11 1300  Gross per 24 hour  Intake 496.67 ml  Output      0 ml  Net 496.67 ml   Blood pressure 102/68, pulse 95, temperature 97.7 F (36.5 C), temperature source Oral, resp. rate 18, height 5\' 4"  (1.626 m), weight 137 lb 5.6 oz (62.3 kg), SpO2 94.00%. Temp:  [97 F (36.1 C)-98.7 F (37.1 C)] 97.7 F (36.5 C) (04/30 1132) Pulse Rate:  [80-98] 95  (04/30 1300) Resp:   [16-18] 18  (04/30 1300) BP: (97-138)/(68-91) 102/68 mmHg (04/30 1300) SpO2:  [90 %-98 %] 94 % (04/30 1300) Weight:  [137 lb 5.6 oz (62.3 kg)-148 lb 9.4 oz (67.4 kg)] 137 lb 5.6 oz (62.3 kg) (04/30 1059)  Physical Exam: General: Much more awake and alert CV: tachycardic, no mgr CHest wall: pm pocket not fluctuant or tender Pulm: fairly clear GI: tender in LLQ near PD site  Lab Results:  Basename 10/13/11 0600 10/12/11 0610  WBC 13.0* 12.5*  HGB 8.8* 8.6*  HCT 27.2* 26.1*  PLT 102* 58*    BMET  Basename 10/13/11 0600 10/12/11 0610  NA 130* 129*  K 3.1* 3.6  CL 93* 92*  CO2 24 22  GLUCOSE 125* 108*  BUN 45* 50*  CREATININE 4.37* 4.34*  CALCIUM 8.8 9.0    Micro Results: Recent Results (from the past 240 hour(s))  CULTURE, BLOOD (ROUTINE X 2)     Status: Normal   Collection Time   10/04/11  2:15 AM      Component Value Range Status Comment   Specimen Description BLOOD RIGHT FINGER   Final    Special Requests BOTTLES DRAWN AEROBIC ONLY [facility]   Final    Culture  Setup Time [id number]   Final    Culture     Final    Value: METHICILLIN RESISTANT STAPHYLOCOCCUS AUREUS     Note: RIFAMPIN AND GENTAMICIN SHOULD NOT BE USED AS SINGLE DRUGS FOR TREATMENT OF STAPH INFECTIONS. This organism DOES NOT demonstrate inducible Clindamycin resistance in vitro. CRITICAL RESULT CALLED TO, READ BACK BY AND VERIFIED WITH: JENAE M @ 1430      10/06/11 BY KRAWS     Note: Gram Stain Report Called to,Read Back By and Verified With: Elsie Lincoln RN on 10/05/11 at 00:35 by Christie Nottingham   Report Status 10/07/2011 FINAL   Final    Organism ID, Bacteria METHICILLIN RESISTANT STAPHYLOCOCCUS AUREUS   Final   CULTURE, BLOOD (ROUTINE X 2)     Status: Normal   Collection Time   10/04/11  2:30 AM      Component Value Range Status Comment   Specimen Description BLOOD  RIGHT HAND   Final    Special Requests BOTTLES DRAWN AEROBIC ONLY 5CC   Final    Culture  Setup Time [id number]   Final    Culture     Final     Value: STAPHYLOCOCCUS AUREUS     Note: SUSCEPTIBILITIES PERFORMED ON PREVIOUS CULTURE WITHIN THE LAST 5 DAYS.     Note: Gram Stain Report Called to,Read Back By and Verified With: Elsie Lincoln RN on 10/05/11 at 00:35 by Christie Nottingham   Report Status 10/07/2011 FINAL   Final   BODY FLUID CULTURE     Status: Normal   Collection Time   10/04/11  6:45 AM      Component Value Range Status Comment   Specimen Description PERITONEAL FLUID   Final    Special Requests 1 CUP   Final    Gram Stain     Final    Value: WBC PRESENT, PREDOMINANTLY PMN     NO ORGANISMS SEEN   Culture NO GROWTH 3 DAYS   Final    Report Status 10/07/2011 FINAL   Final   CULTURE, BLOOD (ROUTINE X 2)     Status: Normal (Preliminary result)   Collection Time   10/07/11  6:20 PM      Component Value Range Status Comment   Specimen Description BLOOD RIGHT HAND   Final    Special Requests BOTTLES DRAWN AEROBIC ONLY 1.5CC   Final    Culture  Setup Time [id number]   Final    Culture     Final    Value: YEAST     Note: Gram Stain Report Called to,Read Back By and Verified With: Bernie Covey RN on 10/11/11 at 04:16 by Christie Nottingham   Report Status PENDING   Incomplete   CULTURE, BLOOD (ROUTINE X 2)     Status: Normal (Preliminary result)   Collection Time   10/07/11  6:32 PM      Component Value Range Status Comment   Specimen Description BLOOD RIGHT ARM   Final    Special Requests BOTTLES DRAWN AEROBIC AND ANAEROBIC 10CC   Final    Culture  Setup Time [id number]   Final    Culture     Final    Value: ENTEROBACTER CLOACAE     CANDIDA GLABRATA     Note: Gram Stain Report Called to,Read Back By and Verified With: ANN MCCRAW @1155  10/09/11 BY KRAWS Gram Stain Report Called to,Read Back By and Verified With: RN L. RIVERS ON 10/10/11 AT 1645 BY TEDAR   Report Status PENDING   Incomplete   CULTURE, BLOOD (ROUTINE X 2)     Status: Normal (Preliminary result)   Collection Time   10/10/11  6:10 AM      Component Value Range Status  Comment   Specimen Description BLOOD RIGHT HAND   Final    Special Requests BOTTLES DRAWN AEROBIC ONLY 10CC   Final    Culture  Setup Time [id number]   Final    Culture     Final    Value:        BLOOD CULTURE RECEIVED NO GROWTH TO DATE CULTURE WILL BE HELD FOR 5 DAYS BEFORE ISSUING A FINAL NEGATIVE REPORT   Report Status PENDING   Incomplete   CULTURE, BLOOD (ROUTINE X 2)     Status: Normal (Preliminary result)   Collection Time   10/10/11  6:15 AM      Component Value Range Status Comment   Specimen  Description BLOOD LEFT ARM   Final    Special Requests BOTTLES DRAWN AEROBIC ONLY 10CC   Final    Culture  Setup Time [id number]   Final    Culture     Final    Value:        BLOOD CULTURE RECEIVED NO GROWTH TO DATE CULTURE WILL BE HELD FOR 5 DAYS BEFORE ISSUING A FINAL NEGATIVE REPORT   Report Status PENDING   Incomplete     Studies/Results: Ir Fluoro Guide Cv Line Right  10/12/2011  *RADIOLOGY REPORT*  Clinical Data: End-stage renal disease and complications from peritoneal dialysis resulting in bacteremia.  The patient requires a temporary non-tunneled dialysis catheter currently as a bridge between peritoneal dialysis and hemodialysis.  NON-TUNNELED CENTRAL VENOUS CATHETER PLACEMENT WITH ULTRASOUND AND FLUOROSCOPIC GUIDANCE  Fluoroscopy Time:  0.3 minutes.  Procedure:  The procedure, risks, benefits, and alternatives were explained to the patient.  Questions regarding the procedure were encouraged and answered.  The patient understands and consents to the procedure.  The right neck and chest were prepped with chlorhexidine in a sterile fashion, and a sterile drape was applied covering the operative field.  Maximum barrier sterile technique with sterile gowns and gloves were used for the procedure.  Local anesthesia was provided with 1% lidocaine.  After creating a small venotomy incision, a 21 gauge needle was advanced into the right internal jugular vein under direct, real- time ultrasound  guidance.  Ultrasound image documentation was performed. The venotomy was dilated over a wire.  Over a wire, a 20 cm Bard Trialysis non tunneled catheter was placed.  Final catheter positioning was confirmed and documented with a fluoroscopic spot image.  The catheter was aspirated, flushed with saline, and injected with appropriate volume heparin dwells.  The catheter exit site was secured with 0-Prolene retention sutures.  Complications: None.  No pneumothorax.  Findings:  After catheter placement, the tip lies just in the superior right atrium.  The catheter aspirates normally and is ready for immediate use.  IMPRESSION:  Placement of non-tunneled hemodialysis catheter via the right internal jugular vein.  The catheter tip lies in the right atrium. The catheter is ready for immediate use.  Original Report Authenticated By: Reola Calkins, M.D.   Ir US Guide Vasc Access Right  10/12/2011  *RADIOLOGY REPORT*  Clinical Data: End-stage renal disease and complications from peritoneal dialysis resulting in bacteremia.  The patient requires a temporary non-tunneled dialysis catheter currently as a bridge between peritoneal dialysis and hemodialysis.  NON-TUNNELED CENTRAL VENOUS CATHETER PLACEMENT WITH ULTRASOUND AND FLUOROSCOPIC GUIDANCE  Fluoroscopy Time:  0.3 minutes.  Procedure:  The procedure, risks, benefits, and alternatives were explained to the patient.  Questions regarding the procedure were encouraged and answered.  The patient understands and consents to the procedure.  The right neck and chest were prepped with chlorhexidine in a sterile fashion, and a sterile drape was applied covering the operative field.  Maximum barrier sterile technique with sterile gowns and gloves were used for the procedure.  Local anesthesia was provided with 1% lidocaine.  After creating a small venotomy incision, a 21 gauge needle was advanced into the right internal jugular vein under direct, real- time ultrasound guidance.   Ultrasound image documentation was performed. The venotomy was dilated over a wire.  Over a wire, a 20 cm Bard Trialysis non tunneled catheter was placed.  Final catheter positioning was confirmed and documented with a fluoroscopic spot image.  The catheter was aspirated, flushed with  saline, and injected with appropriate volume heparin dwells.  The catheter exit site was secured with 0-Prolene retention sutures.  Complications: None.  No pneumothorax.  Findings:  After catheter placement, the tip lies just in the superior right atrium.  The catheter aspirates normally and is ready for immediate use.  IMPRESSION:  Placement of non-tunneled hemodialysis catheter via the right internal jugular vein.  The catheter tip lies in the right atrium. The catheter is ready for immediate use.  Original Report Authenticated By: Reola Calkins, M.D.      Assessment/Plan: [name] is a 61 y.o. female with CAD, ischemic cardiomyopathy and ICD,  ESRD on PD with MRSA bacteremia , candidemia, GNR bacteremia now found to have perforated duodenum/stomach with intrabdominal abscess, portal vein thrombosis and ? Pancreatic malignancy  1) Perforated viscus: Reviewed films with radiolog. PD catheter NOT clearly communicating with her abdominal process. Temporary HD catheter in place now.  -she remains on broad spectrum merrem, vancomycin and mycafungin -ideally PD catheter should come out --no surgery planned at [facility] for present, [facility] following closely  2) MRSA bacteremia: D/w Dr. Anne Fu and did not push for TEE in context of perforated stomach, etc. I DO worry about her ICD being infected, however and I added rifampin yesterday  --continue  RIFAMPIN to her vancomycin for 6 weeks minimum from first negative culture (10/10/11) --need EP doctor to comment on her need for ICD --as she stablizes should get TEE to help guide decision re ICD removal   3) Candida glabrata fungemic: can be azole R at times --continue  mycafungin --Repeat blood cultures pending --add on sensis --Patient will need dedicated a funduscopic exam by ophthalmology to exclude endophthalmitis.  4) GNR in blood: E. Clocae on merrem, sensis pending  Dr. Ninetta Lights on in the am.    LOS: 10 days   Acey Lav 10/13/2011, 2:19 PM

## 2011-10-13 NOTE — Progress Notes (Signed)
Patient ID: [name], female   DOB: 1951-04-24, 61 y.o.   MRN: [id number]    Subjective: Pt c/o some abdominal pain today.  Currently in HD  Objective: Vital signs in last 24 hours: Temp:  [97 F (36.1 C)-98.7 F (37.1 C)] 97 F (36.1 C) (04/30 0645) Pulse Rate:  [78-98] 92  (04/30 0730) Resp:  [16-20] 16  (04/30 0730) BP: (105-142)/(73-99) 105/73 mmHg (04/30 0730) SpO2:  [90 %-98 %] 94 % (04/30 0648) Weight:  [141 lb 12.1 oz (64.3 kg)-148 lb 9.4 oz (67.4 kg)] 141 lb 12.1 oz (64.3 kg) (04/30 0645) Last BM Date: 10/11/11  Intake/Output from previous day: 04/29 0701 - 04/30 0700 In: 496.7 [I.V.:296.7; IV Piggyback:200] Out: 150 [Urine:150] Intake/Output this shift:    PE: Abd: soft, +BS, ND, some abdominal tenderness, but no guarding or peritoneal signs.  Lab Results:   Basename 10/13/11 0600 10/12/11 0610  WBC 13.0* 12.5*  HGB 8.8* 8.6*  HCT 27.2* 26.1*  PLT PENDING 58*   BMET  Basename 10/13/11 0600 10/12/11 0610  NA 130* 129*  K 3.1* 3.6  CL 93* 92*  CO2 24 22  GLUCOSE 125* 108*  BUN 45* 50*  CREATININE 4.37* 4.34*  CALCIUM 8.8 9.0   PT/INR  Basename 10/13/11 0600 10/10/11 2109  LABPROT 16.1* 18.1*  INR 1.26 1.47   CMP     Component Value Date/Time   NA 130* 10/13/2011 0600   K 3.1* 10/13/2011 0600   CL 93* 10/13/2011 0600   CO2 24 10/13/2011 0600   GLUCOSE 125* 10/13/2011 0600   BUN 45* 10/13/2011 0600   CREATININE 4.37* 10/13/2011 0600   CALCIUM 8.8 10/13/2011 0600   PROT 6.1 10/13/2011 0600   ALBUMIN 1.2* 10/13/2011 0600   AST 21 10/13/2011 0600   ALT 14 10/13/2011 0600   ALKPHOS 279* 10/13/2011 0600   BILITOT 0.6 10/13/2011 0600   GFRNONAA 10* 10/13/2011 0600   GFRAA 12* 10/13/2011 0600   Lipase     Component Value Date/Time   LIPASE 15 10/08/2011 0500       Studies/Results: Ir Fluoro Guide Cv Line Right  10/12/2011  *RADIOLOGY REPORT*  Clinical Data: End-stage renal disease and complications from peritoneal dialysis resulting in  bacteremia.  The patient requires a temporary non-tunneled dialysis catheter currently as a bridge between peritoneal dialysis and hemodialysis.  NON-TUNNELED CENTRAL VENOUS CATHETER PLACEMENT WITH ULTRASOUND AND FLUOROSCOPIC GUIDANCE  Fluoroscopy Time:  0.3 minutes.  Procedure:  The procedure, risks, benefits, and alternatives were explained to the patient.  Questions regarding the procedure were encouraged and answered.  The patient understands and consents to the procedure.  The right neck and chest were prepped with chlorhexidine in a sterile fashion, and a sterile drape was applied covering the operative field.  Maximum barrier sterile technique with sterile gowns and gloves were used for the procedure.  Local anesthesia was provided with 1% lidocaine.  After creating a small venotomy incision, a 21 gauge needle was advanced into the right internal jugular vein under direct, real- time ultrasound guidance.  Ultrasound image documentation was performed. The venotomy was dilated over a wire.  Over a wire, a 20 cm Bard Trialysis non tunneled catheter was placed.  Final catheter positioning was confirmed and documented with a fluoroscopic spot image.  The catheter was aspirated, flushed with saline, and injected with appropriate volume heparin dwells.  The catheter exit site was secured with 0-Prolene retention sutures.  Complications: None.  No pneumothorax.  Findings:  After  catheter placement, the tip lies just in the superior right atrium.  The catheter aspirates normally and is ready for immediate use.  IMPRESSION:  Placement of non-tunneled hemodialysis catheter via the right internal jugular vein.  The catheter tip lies in the right atrium. The catheter is ready for immediate use.  Original Report Authenticated By: Reola Calkins, M.D.   Ir US Guide Vasc Access Right  10/12/2011  *RADIOLOGY REPORT*  Clinical Data: End-stage renal disease and complications from peritoneal dialysis resulting in  bacteremia.  The patient requires a temporary non-tunneled dialysis catheter currently as a bridge between peritoneal dialysis and hemodialysis.  NON-TUNNELED CENTRAL VENOUS CATHETER PLACEMENT WITH ULTRASOUND AND FLUOROSCOPIC GUIDANCE  Fluoroscopy Time:  0.3 minutes.  Procedure:  The procedure, risks, benefits, and alternatives were explained to the patient.  Questions regarding the procedure were encouraged and answered.  The patient understands and consents to the procedure.  The right neck and chest were prepped with chlorhexidine in a sterile fashion, and a sterile drape was applied covering the operative field.  Maximum barrier sterile technique with sterile gowns and gloves were used for the procedure.  Local anesthesia was provided with 1% lidocaine.  After creating a small venotomy incision, a 21 gauge needle was advanced into the right internal jugular vein under direct, real- time ultrasound guidance.  Ultrasound image documentation was performed. The venotomy was dilated over a wire.  Over a wire, a 20 cm Bard Trialysis non tunneled catheter was placed.  Final catheter positioning was confirmed and documented with a fluoroscopic spot image.  The catheter was aspirated, flushed with saline, and injected with appropriate volume heparin dwells.  The catheter exit site was secured with 0-Prolene retention sutures.  Complications: None.  No pneumothorax.  Findings:  After catheter placement, the tip lies just in the superior right atrium.  The catheter aspirates normally and is ready for immediate use.  IMPRESSION:  Placement of non-tunneled hemodialysis catheter via the right internal jugular vein.  The catheter tip lies in the right atrium. The catheter is ready for immediate use.  Original Report Authenticated By: Reola Calkins, M.D.    Anti-infectives: Anti-infectives     Start     Dose/Rate Route Frequency Ordered Stop   10/12/11 2200   meropenem (MERREM) 1 g in sodium chloride 0.9 % 100 mL IVPB         1 g 200 mL/hr over 30 Minutes Intravenous Every 24 hours 10/12/11 0939     10/12/11 1300   rifampin (RIFADIN) 300 mg in sodium chloride 0.9 % 100 mL IVPB        300 mg 200 mL/hr over 30 Minutes Intravenous Every 12 hours 10/12/11 1154     10/11/11 1200   meropenem (MERREM) 500 mg in sodium chloride 0.9 % 50 mL IVPB  Status:  Discontinued        500 mg 100 mL/hr over 30 Minutes Intravenous Every 24 hours 10/11/11 1138 10/12/11 0939   10/09/11 1400   fluconazole (DIFLUCAN) tablet 100 mg  Status:  Discontinued        100 mg Oral Daily 10/09/11 1237 10/09/11 1312   10/09/11 1400   micafungin (MYCAMINE) 100 mg in sodium chloride 0.9 % 100 mL IVPB        100 mg 100 mL/hr over 1 Hours Intravenous Every 24 hours 10/09/11 1313     10/09/11 1300   cefTAZidime (FORTAZ) 500 mg in dextrose 5 % 50 mL IVPB  Status:  Discontinued  500 mg 100 mL/hr over 30 Minutes Intravenous Every 24 hours 10/09/11 0918 10/11/11 1028   10/08/11 1300   cefTAZidime (FORTAZ) 1 g in dextrose 5 % 50 mL IVPB  Status:  Discontinued        1 g 100 mL/hr over 30 Minutes Intravenous Every 24 hours 10/08/11 1151 10/09/11 0917   10/07/11 1000   piperacillin-tazobactam (ZOSYN) IVPB 2.25 g  Status:  Discontinued        2.25 g 100 mL/hr over 30 Minutes Intravenous 3 times per day 10/07/11 0927 10/08/11 1145   10/07/11 0930   vancomycin (VANCOCIN) 1,750 mg in sodium chloride 0.9 % 500 mL IVPB        1,750 mg 250 mL/hr over 120 Minutes Intravenous  Once 10/07/11 0927 10/07/11 1241   10/05/11 0300   vancomycin (VANCOCIN) 1,750 mg in sodium chloride 0.9 % 500 mL IVPB        1,750 mg 250 mL/hr over 120 Minutes Intravenous  Once 10/05/11 0204 10/05/11 0500   10/04/11 0300   Ampicillin-Sulbactam (UNASYN) 3 g in sodium chloride 0.9 % 100 mL IVPB  Status:  Discontinued     Comments: Unasyn 3 g IV q24h on Peritoneal Dialysis      3 g 100 mL/hr over 60 Minutes Intravenous Daily at bedtime 10/04/11 0151 10/07/11 0922            Assessment/Plan 1. Gastric perforation likely secondary to perforation of pseudocyst into stomach  2. Hypoalbuminemia  3. ESRD  4. Thrombocytopenia  5. Multiple medical problems  Plan: 1. Patient is currently stable.  She continues to have some abdominal pain, but still feel that she is extremely high risk for surgery and suspect she will likely improve without surgical intervention. 2. If the patient has an offer to go to the [place], then we feel that she is surgically stable for transfer.  They are aware of the patient and have treated her multiple times for similar episodes.  While she is here we will continue to follow her.  LOS: 10 days    OSBORNE,KELLY E 10/13/2011 She still has mild tenderness but no evidence of perforation or peritonitis.  Recommend UGI to eval for leak prior to advancing diet.  If UGI okay, then should be okay to trial diet.

## 2011-10-13 NOTE — Progress Notes (Signed)
   CARE MANAGEMENT NOTE 10/13/2011  Patient:  [name], [name]   Account Number:  [id number]  Date Initiated:  10/05/2011  Documentation initiated by:  Onnie Boer  Subjective/Objective Assessment:   PT WAS ADMITTED WITH PANCREATIC CYST AND INFECTION     Action/Plan:   PROGRESSION OF CARE AND DISCHARGE PLANNING   Anticipated DC Date:  10/09/2011   Anticipated DC Plan:  HOME W HOME HEALTH SERVICES      DC Planning Services  CM consult      Choice offered to / List presented to:             Status of service:  In process, will continue to follow Medicare Important Message given?   (If response is "NO", the following Medicare IM given date fields will be blank) Date Medicare IM given:   Date Additional Medicare IM given:    Discharge Disposition:    Per UR Regulation:  Reviewed for med. necessity/level of care/duration of stay  If discussed at Long Length of Stay Meetings, dates discussed:    Comments:  10/13/11 Onnie Boer, RN, BSN 1536 PT RECEIVED NEW HD ACCESS YESTERDAY AND WAS DIALYSIED TODAY.  [facility].  ALL PAPERWORK HAS BEEN FAXED AND WE ARE AWAITING RESPONSE.  WILL F/U TOMORROW.  10/05/11 Onnie Boer, RN, BSN 1557 PTA PT WAS AT HOME WITH HER DAUGHTER.  PT DOES PERITONEAL DIALYSIS.  WILL F/U ON DC NEEDS

## 2011-10-13 NOTE — Discharge Summary (Signed)
DISCHARGE SUMMARY  [name]  MR#: [id number]  DOB:[birth year]  Date of Admission: 10/03/2011 Date of Discharge: 10/13/2011  Attending Physician:Sherral Dirocco  Patient's PCP:No primary provider on file.  Consults:Treatment Team:  Irena Cords, MD Md Ccs, MD Donato Schultz, MD Percival Spanish, MD. [facility].  Discharge Diagnoses: Present on Admission:  .Epigastric abdominal pain .Pancreatic pseudocyst .End stage renal disease .Ischemic cardiomyopathy .Chronic systolic congestive heart failure .Pancreatitis chronic .Hyperkalemia .Hyponatremia .Bowel perforation .Bacteremia due to Staphylococcus aureus .Fungemia .Enterobacter sepsis .Thrombocytopenia .Anemia  Hospital Course: [name] is a very pleasant 61 year old female with multiple comobids, admitted on 10/04/11 with abdominal pain and encephalopathy. Eventually, she was found to have perforated bowel, thought to be related to ruptured pancreatic pseudocyst, and to be septic, with blood cultures growing MRSA/Candida glabrata/Enterobacter cloacae. She had been on peritoneal dialysis till today, when she was switched to HD, with plans to remove PD catheter. Decision was made not to pursue surgery for the perforation for now given patient's high risk nature, and that she seems getting better. Her mentation has improved- seems back to baseline now- it was likely related to sepsis. She also required a unit of blood. Appreciate all the assistance from renal/ccs/ir/neuro/cardilogy/id. [facility]). I have kept her daughter [name] abreast of the developments. Today [name] wants to get out of bed. She says her belly pain is still there. I have requested TPN feeding, as she has been NPO since perforation diagnosed.   Medication List  As of 10/13/2011  7:22 PM   ASK your doctor about these medications         carvedilol 6.25  MG tablet   Commonly known as: COREG   Take 6.25 mg by mouth 2 (two) times daily with a meal.      DAILY VITAMIN PO   Take 1 tablet by mouth daily.      doxazosin 2 MG tablet   Commonly known as: CARDURA   Take 2 mg by mouth at bedtime.      fentaNYL 50 MCG/HR   Commonly known as: DURAGESIC - dosed mcg/hr   Place 1 patch onto the skin every 3 (three) days.      insulin glargine 100 UNIT/ML injection   Commonly known as: LANTUS   Inject 10 Units into the skin daily as needed. Per sliding scale      pravastatin 20 MG tablet   Commonly known as: PRAVACHOL   Take 20 mg by mouth daily.      ranolazine 500 MG 12 hr tablet   Commonly known as: RANEXA   Take 500 mg by mouth daily.      RENAGEL 800 MG tablet   Generic drug: sevelamer   Take 3,200 mg by mouth 3 (three) times daily with meals.             Day of Discharge BP 116/80  Pulse 101  Temp(Src) 98 F (36.7 C) (Oral)  Resp 17  Ht 5\' 4"  (1.626 m)  Wt 62.3 kg (137 lb 5.6 oz)  BMI 23.58 kg/m2  SpO2 95%  Physical Exam: Sitting up on edge of bed. Not in distress Lungs clear. HD cath in place. Abdomen- Pd cath in place. Tenderness to deep palpation in epigastric area. +BS. CNS- grossly Intact. Extremities- no pedal edema.   Results for orders placed during the hospital encounter of 10/03/11 (from the past 24 hour(s))  GLUCOSE,  CAPILLARY     Status: Abnormal   Collection Time   10/12/11  8:04 PM      Component Value Range   Glucose-Capillary 119 (*) 70 - 99 (mg/dL)  GLUCOSE, CAPILLARY     Status: Abnormal   Collection Time   10/12/11 11:53 PM      Component Value Range   Glucose-Capillary 135 (*) 70 - 99 (mg/dL)  CBC     Status: Abnormal   Collection Time   10/13/11  6:00 AM      Component Value Range   WBC 13.0 (*) 4.0 - 10.5 (K/uL)   RBC 3.18 (*) 3.87 - 5.11 (MIL/uL)   Hemoglobin 8.8 (*) 12.0 - 15.0 (g/dL)   HCT 16.1 (*) 09.6 - 46.0 (%)   MCV 85.5  78.0 - 100.0 (fL)   MCH 27.7  26.0 - 34.0 (pg)   MCHC 32.4   30.0 - 36.0 (g/dL)   RDW 04.5 (*) 40.9 - 15.5 (%)   Platelets 102 (*) 150 - 400 (K/uL)  COMPREHENSIVE METABOLIC PANEL     Status: Abnormal   Collection Time   10/13/11  6:00 AM      Component Value Range   Sodium 130 (*) 135 - 145 (mEq/L)   Potassium 3.1 (*) 3.5 - 5.1 (mEq/L)   Chloride 93 (*) 96 - 112 (mEq/L)   CO2 24  19 - 32 (mEq/L)   Glucose, Bld 125 (*) 70 - 99 (mg/dL)   BUN 45 (*) 6 - 23 (mg/dL)   Creatinine, Ser 8.11 (*) 0.50 - 1.10 (mg/dL)   Calcium 8.8  8.4 - 91.4 (mg/dL)   Total Protein 6.1  6.0 - 8.3 (g/dL)   Albumin 1.2 (*) 3.5 - 5.2 (g/dL)   AST 21  0 - 37 (U/L)   ALT 14  0 - 35 (U/L)   Alkaline Phosphatase 279 (*) 39 - 117 (U/L)   Total Bilirubin 0.6  0.3 - 1.2 (mg/dL)   GFR calc non Af Amer 10 (*) >90 (mL/min)   GFR calc Af Amer 12 (*) >90 (mL/min)  PROTIME-INR     Status: Abnormal   Collection Time   10/13/11  6:00 AM      Component Value Range   Prothrombin Time 16.1 (*) 11.6 - 15.2 (seconds)   INR 1.26  0.00 - 1.49   PHOSPHORUS     Status: Normal   Collection Time   10/13/11  6:00 AM      Component Value Range   Phosphorus 4.4  2.3 - 4.6 (mg/dL)  GLUCOSE, CAPILLARY     Status: Normal   Collection Time   10/13/11 11:29 AM      Component Value Range   Glucose-Capillary 89  70 - 99 (mg/dL)  GLUCOSE, CAPILLARY     Status: Normal   Collection Time   10/13/11  4:45 PM      Component Value Range   Glucose-Capillary 88  70 - 99 (mg/dL)    [facility].  [facility] while [facility]: Plan  * MRSA/Enterobacter bacteremia/Candida glabrata sepsis- Appreciate ID. On micafungin/vanc/meropenem/rifambin. Clinically better, afebrile, wbc improving. Defer mx to id. *Perforated Viscus- appreciate surgery. Watch wait for now. Defer mx to [facility]. * Ischemic cardiomyopathy/ Chronic systolic congestive heart failure(20%)- stable.  * End stage renal disease-on PD. Now on Hd, removal of Pd cath per renal.  * Pancreatic pseudocyst  * Anemia of chronic kidney  disease- stable. Received unit of blood.  *Thrombocytopenia- may be  related to ongoing infection. Monitor.  * encephalopathy- likely related to infection.Resolved. * feeding- start tpn *OOB to chair.     Signed: Stedman [place] 10/13/2011, 7:22 PM

## 2011-10-13 NOTE — Procedures (Signed)
Pt seen on HD.  K 3.1 on 4K bath  Ap 150 Vp 150.  She is agreeable to having her PD cath removed.  ? Is whether it should be done here or at the [place].  Will change her aranesp to IV

## 2011-10-14 ENCOUNTER — Inpatient Hospital Stay (HOSPITAL_COMMUNITY): Payer: Medicare Other

## 2011-10-14 DIAGNOSIS — R7881 Bacteremia: Secondary | ICD-10-CM

## 2011-10-14 DIAGNOSIS — K863 Pseudocyst of pancreas: Secondary | ICD-10-CM

## 2011-10-14 DIAGNOSIS — K631 Perforation of intestine (nontraumatic): Secondary | ICD-10-CM

## 2011-10-14 DIAGNOSIS — B377 Candidal sepsis: Secondary | ICD-10-CM

## 2011-10-14 DIAGNOSIS — K862 Cyst of pancreas: Secondary | ICD-10-CM

## 2011-10-14 LAB — DIFFERENTIAL
Basophils Relative: 1 % (ref 0–1)
Lymphocytes Relative: 13 % (ref 12–46)
Lymphs Abs: 1.8 10*3/uL (ref 0.7–4.0)
Monocytes Relative: 10 % (ref 3–12)
Neutro Abs: 10.4 10*3/uL — ABNORMAL HIGH (ref 1.7–7.7)

## 2011-10-14 LAB — CBC
HCT: 27.7 % — ABNORMAL LOW (ref 36.0–46.0)
Hemoglobin: 8.8 g/dL — ABNORMAL LOW (ref 12.0–15.0)
MCH: 27.8 pg (ref 26.0–34.0)
MCHC: 31.8 g/dL (ref 30.0–36.0)
RBC: 3.16 MIL/uL — ABNORMAL LOW (ref 3.87–5.11)

## 2011-10-14 LAB — COMPREHENSIVE METABOLIC PANEL
ALT: 16 U/L (ref 0–35)
Alkaline Phosphatase: 272 U/L — ABNORMAL HIGH (ref 39–117)
BUN: 20 mg/dL (ref 6–23)
CO2: 26 mEq/L (ref 19–32)
Calcium: 8.4 mg/dL (ref 8.4–10.5)
GFR calc Af Amer: 20 mL/min — ABNORMAL LOW (ref >90)
GFR calc non Af Amer: 17 mL/min — ABNORMAL LOW (ref >90)
Glucose, Bld: 73 mg/dL (ref 70–99)
Sodium: 137 mEq/L (ref 135–145)
Total Protein: 6 g/dL (ref 6.0–8.3)

## 2011-10-14 LAB — CULTURE, BLOOD (ROUTINE X 2): Culture  Setup Time: 201304250132

## 2011-10-14 LAB — GLUCOSE, CAPILLARY: Glucose-Capillary: 72 mg/dL (ref 70–99)

## 2011-10-14 LAB — PREALBUMIN: Prealbumin: 8.1 mg/dL — ABNORMAL LOW (ref 17.0–34.0)

## 2011-10-14 LAB — CHOLESTEROL, TOTAL: Cholesterol: 71 mg/dL (ref 0–200)

## 2011-10-14 MED ORDER — HYDROMORPHONE HCL PF 1 MG/ML IJ SOLN
0.5000 mg | Freq: Once | INTRAMUSCULAR | Status: AC
  Administered 2011-10-14: 0.5 mg via INTRAVENOUS

## 2011-10-14 MED ORDER — PANTOPRAZOLE SODIUM 40 MG IV SOLR: 40.0000 mg | INTRAVENOUS | Status: DC

## 2011-10-14 MED ORDER — RENA-VITE PO TABS: 1.0000 | ORAL_TABLET | Freq: Every day | ORAL | Status: DC

## 2011-10-14 MED ORDER — DARBEPOETIN ALFA-POLYSORBATE 100 MCG/0.5ML IJ SOLN: 100.0000 ug | INTRAMUSCULAR | Status: DC

## 2011-10-14 MED ORDER — RENA-VITE PO TABS
1.0000 | ORAL_TABLET | Freq: Every day | ORAL | Status: DC
  Filled 2011-10-14: qty 1

## 2011-10-14 MED ORDER — CIPROFLOXACIN IN D5W 200 MG/100ML IV SOLN
200.0000 mg | Freq: Two times a day (BID) | INTRAVENOUS | Status: DC
  Filled 2011-10-14 (×2): qty 100

## 2011-10-14 MED ORDER — SODIUM CHLORIDE 0.9 % IV SOLN: 100.0000 mg | INTRAVENOUS | Status: DC

## 2011-10-14 MED ORDER — HYDROMORPHONE HCL PF 1 MG/ML IJ SOLN: 1.0000 mg | INTRAMUSCULAR | Status: DC | PRN

## 2011-10-14 MED ORDER — CIPROFLOXACIN IN D5W 200 MG/100ML IV SOLN: 200.0000 mg | Freq: Two times a day (BID) | INTRAVENOUS | Status: DC

## 2011-10-14 NOTE — Progress Notes (Signed)
Notified per microbiology pt. With resistant KPC growing in culture. MD, Dr. Laural Benes, text-paged for notification. Dondra Spry

## 2011-10-14 NOTE — Progress Notes (Signed)
Nutrition Follow-up  Pt continues on clear liquids, was NPO since 4/27 advanced to clear liquids today after lunch. Pt has not advanced beyond clear liquids throughout entire duration of this hospitalization.   Pt has now switched to hemodialysis (from PD.) Planning to transfer to [facility] soon.  Diet Order:  Clear Liquids  Meds: Scheduled Meds:   . acetaminophen      . ciprofloxacin  200 mg Intravenous Q12H  . darbepoetin      . darbepoetin (ARANESP) injection - DIALYSIS  100 mcg Intravenous Q Tue-HD  .  HYDROmorphone (DILAUDID) injection  0.5 mg Intravenous Once  . insulin aspart  0-9 Units Subcutaneous QID  . micafungin (MYCAMINE) IV  100 mg Intravenous Q24H  . multivitamin  1 tablet Oral QHS  . pantoprazole (PROTONIX) IV  40 mg Intravenous Q24H  . ranolazine  500 mg Oral Daily  . rifampin (RIFADIN) IVPB  300 mg Intravenous Q12H  . sevelamer  3,200 mg Oral TID WC  . sodium chloride  3 mL Intravenous Q12H  . DISCONTD: feeding supplement  1 Container Oral TID BM  . DISCONTD: meropenem (MERREM) IV  1 g Intravenous Q24H  . DISCONTD: multivitamin  1 tablet Oral QHS  . DISCONTD: pantoprazole  40 mg Oral Q1200  . DISCONTD: simvastatin  10 mg Oral q1800   Continuous Infusions:   . dextrose 5 % and 0.9% NaCl 10 mL/hr at 10/12/11 1500  . dialysis solution 1.5% low-MG/low-CA     PRN Meds:.acetaminophen, acetaminophen, heparin, HYDROmorphone, hydrOXYzine, ondansetron (ZOFRAN) IV, ondansetron  Labs:  CMP     Component Value Date/Time   NA 137 10/14/2011 0550   K 3.5 10/14/2011 0550   CL 102 10/14/2011 0550   CO2 26 10/14/2011 0550   GLUCOSE 73 10/14/2011 0550   BUN 20 10/14/2011 0550   CREATININE 2.79* 10/14/2011 0550   CALCIUM 8.4 10/14/2011 0550   PROT 6.0 10/14/2011 0550   ALBUMIN 1.2* 10/14/2011 0550   AST 23 10/14/2011 0550   ALT 16 10/14/2011 0550   ALKPHOS 272* 10/14/2011 0550   BILITOT 0.8 10/14/2011 0550   GFRNONAA 17* 10/14/2011 0550   GFRAA 20* 10/14/2011 0550  Phosphorus 3.4 WNL Magnesium 1.7  WNL   Intake/Output Summary (Last 24 hours) at 10/14/11 1401 Last data filed at 10/14/11 1300  Gross per 24 hour  Intake    100 ml  Output      1 ml  Net     99 ml   Weight Status:  62.4 kg s/p HD on 4/30  BMI 23.2 - WNL  Re-estimated needs:  1800 - 2000 kcal, 74  - 87 grams protein  Nutrition Dx:  Inadequate oral intake - ongoing.  Goal:   1. PO intake > 75% at meals; not yet meeting. Continue. 2. Diet advancement as medically able. Intake to meet > 90% if estimated energy needs; not yet meeting. Continue.  Intervention:   1. Continue Resource Breeze PO TID to maximize intake on current diet order. 2. Pt has been NPO/Clear Liquids x 11 days; would benefit from nutrition support if unable to advance diet 3. RD to continue to follow  Monitor:  PO intake, weight trends, labs, Diet advancement  Adair Laundry Pager #:  [phone]

## 2011-10-14 NOTE — Progress Notes (Signed)
S: says abd pain better.  Tolerated HD well O:BP 135/84  Pulse 96  Temp(Src) 98.4 F (36.9 C) (Oral)  Resp 20  Ht 5\' 4"  (1.626 m)  Wt 61.417 kg (135 lb 6.4 oz)  BMI 23.24 kg/m2  SpO2 100%  Intake/Output Summary (Last 24 hours) at 10/14/11 1005 Last data filed at 10/14/11 0900  Gross per 24 hour  Intake    100 ml  Output      1 ml  Net     99 ml   Weight change: -5.1 kg (-11 lb 3.9 oz) WUJ:WJXBJ and alert CVS:RRR Resp:Basilar crackles Abd:+bs abd less tender Ext:no edema NEURO: Ox3      . acetaminophen      . darbepoetin      . darbepoetin (ARANESP) injection - DIALYSIS  100 mcg Intravenous Q Tue-HD  . insulin aspart  0-9 Units Subcutaneous QID  . meropenem (MERREM) IV  1 g Intravenous Q24H  . micafungin (MYCAMINE) IV  100 mg Intravenous Q24H  . multivitamin  1 tablet Oral QHS  . pantoprazole (PROTONIX) IV  40 mg Intravenous Q24H  . ranolazine  500 mg Oral Daily  . rifampin (RIFADIN) IVPB  300 mg Intravenous Q12H  . sevelamer  3,200 mg Oral TID WC  . sodium chloride  3 mL Intravenous Q12H  . vancomycin  750 mg Intravenous Q Tue-HD  . DISCONTD: feeding supplement  1 Container Oral TID BM  . DISCONTD: multivitamin  1 tablet Oral QHS  . DISCONTD: pantoprazole  40 mg Oral Q1200  . DISCONTD: simvastatin  10 mg Oral q1800   Ir Fluoro Guide Cv Line Right  10/12/2011  *RADIOLOGY REPORT*  Clinical Data: End-stage renal disease and complications from peritoneal dialysis resulting in bacteremia.  The patient requires a temporary non-tunneled dialysis catheter currently as a bridge between peritoneal dialysis and hemodialysis.  NON-TUNNELED CENTRAL VENOUS CATHETER PLACEMENT WITH ULTRASOUND AND FLUOROSCOPIC GUIDANCE  Fluoroscopy Time:  0.3 minutes.  Procedure:  The procedure, risks, benefits, and alternatives were explained to the patient.  Questions regarding the procedure were encouraged and answered.  The patient understands and consents to the procedure.  The right neck and  chest were prepped with chlorhexidine in a sterile fashion, and a sterile drape was applied covering the operative field.  Maximum barrier sterile technique with sterile gowns and gloves were used for the procedure.  Local anesthesia was provided with 1% lidocaine.  After creating a small venotomy incision, a 21 gauge needle was advanced into the right internal jugular vein under direct, real- time ultrasound guidance.  Ultrasound image documentation was performed. The venotomy was dilated over a wire.  Over a wire, a 20 cm Bard Trialysis non tunneled catheter was placed.  Final catheter positioning was confirmed and documented with a fluoroscopic spot image.  The catheter was aspirated, flushed with saline, and injected with appropriate volume heparin dwells.  The catheter exit site was secured with 0-Prolene retention sutures.  Complications: None.  No pneumothorax.  Findings:  After catheter placement, the tip lies just in the superior right atrium.  The catheter aspirates normally and is ready for immediate use.  IMPRESSION:  Placement of non-tunneled hemodialysis catheter via the right internal jugular vein.  The catheter tip lies in the right atrium. The catheter is ready for immediate use.  Original Report Authenticated By: Reola Calkins, M.D.   Ir US Guide Vasc Access Right  10/12/2011  *RADIOLOGY REPORT*  Clinical Data: End-stage renal disease and complications from  peritoneal dialysis resulting in bacteremia.  The patient requires a temporary non-tunneled dialysis catheter currently as a bridge between peritoneal dialysis and hemodialysis.  NON-TUNNELED CENTRAL VENOUS CATHETER PLACEMENT WITH ULTRASOUND AND FLUOROSCOPIC GUIDANCE  Fluoroscopy Time:  0.3 minutes.  Procedure:  The procedure, risks, benefits, and alternatives were explained to the patient.  Questions regarding the procedure were encouraged and answered.  The patient understands and consents to the procedure.  The right neck and chest  were prepped with chlorhexidine in a sterile fashion, and a sterile drape was applied covering the operative field.  Maximum barrier sterile technique with sterile gowns and gloves were used for the procedure.  Local anesthesia was provided with 1% lidocaine.  After creating a small venotomy incision, a 21 gauge needle was advanced into the right internal jugular vein under direct, real- time ultrasound guidance.  Ultrasound image documentation was performed. The venotomy was dilated over a wire.  Over a wire, a 20 cm Bard Trialysis non tunneled catheter was placed.  Final catheter positioning was confirmed and documented with a fluoroscopic spot image.  The catheter was aspirated, flushed with saline, and injected with appropriate volume heparin dwells.  The catheter exit site was secured with 0-Prolene retention sutures.  Complications: None.  No pneumothorax.  Findings:  After catheter placement, the tip lies just in the superior right atrium.  The catheter aspirates normally and is ready for immediate use.  IMPRESSION:  Placement of non-tunneled hemodialysis catheter via the right internal jugular vein.  The catheter tip lies in the right atrium. The catheter is ready for immediate use.  Original Report Authenticated By: Reola Calkins, M.D.   BMET    Component Value Date/Time   NA 137 10/14/2011 0550   K 3.5 10/14/2011 0550   CL 102 10/14/2011 0550   CO2 26 10/14/2011 0550   GLUCOSE 73 10/14/2011 0550   BUN 20 10/14/2011 0550   CREATININE 2.79* 10/14/2011 0550   CALCIUM 8.4 10/14/2011 0550   GFRNONAA 17* 10/14/2011 0550   GFRAA 20* 10/14/2011 0550   CBC    Component Value Date/Time   WBC 13.8* 10/14/2011 0550   WBC 6.7 04/08/2011 1054   RBC 3.16* 10/14/2011 0550   RBC 3.40* 04/08/2011 1054   HGB 8.8* 10/14/2011 0550   HGB 10.1* 04/08/2011 1054   HCT 27.7* 10/14/2011 0550   HCT 30.1* 04/08/2011 1054   PLT 124* 10/14/2011 0550   PLT 232 04/08/2011 1054   MCV 87.7 10/14/2011 0550   MCV 88.6 04/08/2011 1054   MCH  27.8 10/14/2011 0550   MCH 29.7 04/08/2011 1054   MCHC 31.8 10/14/2011 0550   MCHC 33.5 04/08/2011 1054   RDW 18.1* 10/14/2011 0550   RDW 20.4* 04/08/2011 1054   LYMPHSABS 1.8 10/14/2011 0550   LYMPHSABS 1.4 04/08/2011 1054   MONOABS 1.4* 10/14/2011 0550   MONOABS 0.4 04/08/2011 1054   EOSABS 0.1 10/14/2011 0550   EOSABS 0.2 04/08/2011 1054   BASOSABS 0.1 10/14/2011 0550   BASOSABS 0.0 04/08/2011 1054     Assessment: 1. ESRD switched to HD 2. Candidemia 3 Perforated stomach 4. Pancreatic mass ?inflammatory 5. Thrombocytopenia 6. Cardiomyapthy  Plan: 1. Needs to get PD cath out 2. Had UGI today results P 3. Plan HD in AM [facility] T

## 2011-10-14 NOTE — Progress Notes (Signed)
   CARE MANAGEMENT NOTE 10/14/2011  Patient:  [name], [name]   Account Number:  [id number]  Date Initiated:  10/05/2011  Documentation initiated by:  Onnie Boer  Subjective/Objective Assessment:   PT WAS ADMITTED WITH PANCREATIC CYST AND INFECTION     Action/Plan:   PROGRESSION OF CARE AND DISCHARGE PLANNING   Anticipated DC Date:  10/09/2011   Anticipated DC Plan:  HOME W HOME HEALTH SERVICES      DC Planning Services  CM consult      Choice offered to / List presented to:             Status of service:  Completed, signed off Medicare Important Message given?   (If response is "NO", the following Medicare IM given date fields will be blank) Date Medicare IM given:   Date Additional Medicare IM given:    Discharge Disposition:  ACUTE TO ACUTE TRANS  Per UR Regulation:  Reviewed for med. necessity/level of care/duration of stay  If discussed at Long Length of Stay Meetings, dates discussed:   10/14/2011    Comments:  10/14/11 Onnie Boer, RN, BSN 1423 [facility].  10/13/11 Onnie Boer, RN, BSN 1536 PT RECEIVED NEW HD ACCESS YESTERDAY AND WAS DIALYSIED TODAY.  PT IS [facility]RE[facility]ARE.  ALL PAPERWORK HAS BEEN FAXED AND WE ARE AWAITING RESPONSE.  WILL F/U TOMORROW.  10/05/11 Onnie Boer, RN, BSN 1557 PTA PT WAS AT HOME WITH HER DAUGHTER.  PT DOES PERITONEAL DIALYSIS.  WILL F/U ON DC NEEDS

## 2011-10-14 NOTE — Discharge Summary (Signed)
HOSPITAL DISCHARGE SUMMARY   @n   [name], 61 y.o., DOB 10-30-1950  Admission date: 10/03/2011 Discharge Date: 10/14/2011  Primary MD No primary provider on file.  Admitting Physician Ron Parker, MD  Admission Diagnosis  End stage renal disease [585.6] Pancreatic pseudocyst [577.2] Epigastric abdominal pain [789.06] abd pain  Discharge Diagnoses:   Active Hospital Problems  Diagnoses Date Noted   . Epigastric abdominal pain 10/04/2011   . Bowel perforation 10/13/2011   . Bacteremia due to Staphylococcus aureus 10/13/2011   . Fungemia 10/13/2011   . Enterobacter sepsis 10/13/2011   . Thrombocytopenia 10/13/2011   . Anemia 10/13/2011   . Pancreatitis chronic 10/04/2011   . Pancreatic pseudocyst 09/14/2011   . End stage renal disease 06/03/2011   . Ischemic cardiomyopathy 12/01/2010   . Chronic systolic congestive heart failure 12/01/2010     Resolved Hospital Problems  Diagnoses Date Noted Date Resolved  . Hyperkalemia 10/04/2011 10/13/2011  . Hyponatremia 10/04/2011 10/13/2011    Past Medical History  Diagnosis Date  . Gallstone pancreatitis   . Coronary artery disease     s/p CABG 2008 with multiple PCIs  . Ischemic cardiomyopathy   . End stage renal disease   . Diabetes mellitus   . Hypertension   . History of nonadherence to medical treatment   . LBBB (left bundle branch block)   . Hyperlipidemia   . Renal insufficiency   . CHF (congestive heart failure)   . Dialysis patient   . Anemia     Past Surgical History  Procedure Date  . Coronary artery bypass graft 2008  . Cholecystectomy   . Abdominal hysterectomy   . Tubal ligation   . Tonsillectomy   . Insert / replace / remove pacemaker     pacemaker ICD  . Esophagogastroduodenoscopy 09/15/2011    Procedure: ESOPHAGOGASTRODUODENOSCOPY (EGD);  Surgeon: Theda Belfast, MD;  Location: [facility] ENDOSCOPY;  Service: Endoscopy;  Laterality: N/A;    Consults   Irena Cords, MD  Md Ccs, MD   [facility]) Donato Schultz, MD  Percival Spanish, MD.  [facility].   Significant Tests:  See full reports for all details    UPPER GI STUDY DONE ON 10/14/11 IMPRESSION:  1. No definite gastric perforation identified on this examination.  This suggests interval healing of the previously noted perforation  seen on CT scan 10/10/2011.  2. However, the stomach appears very abnormal, as above. 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 Further evaluation with endoscopy may be warranted if  clinically indicated.  Original Report Authenticated By: Florencia Reasons, M.D.   ECHOCARDIOGRAM [facility]* *[facility]* [address] [place], [place] [address] [phone]  ------------------------------------------------------------ Transthoracic Echocardiography  Patient: [name], [name] MR #: [id number] Study Date: 10/07/2011 Gender: F Age: 16 Height: 162.6cm Weight: 66.7kg BSA: 1.30m^2 Pt. Status: Room: 6734  PERFORMING [place], [facility] [place], [place] SONOGRAPHER Jeryl Columbia cc:  ------------------------------------------------------------ LV EF: 20% - 25%  ------------------------------------------------------------ History: PMH: check pacemaker wires Coronary artery disease. Congestive heart failure. Risk factors: Hypertension. Diabetes mellitus.  ------------------------------------------------------------ Study Conclusions  - Left ventricle: The cavity size was moderately dilated. Wall thickness was normal. Systolic function was severely reduced. The estimated ejection  fraction was in the range of 20% to 25%. Diffuse hypokinesis.  Akinesis of the inferior and apical myocardium. - Ventricular septum: Septal motion showed abnormal function and dyssynergy. - Mitral valve: Moderate regurgitation. - Left atrium: The atrium was moderately dilated.  ------------------------------------------------------------ Labs, prior tests, procedures, and surgery: Permanent pacemaker system implantation.  Coronary artery bypass grafting. Transthoracic echocardiography. M-mode, complete 2D, spectral Doppler, and color Doppler. Height: Height: 162.6cm. Height: 64in. Weight: Weight: 66.7kg. Weight: 146.7lb. Body mass index: BMI: 25.2kg/m^2. Body surface area: BSA: 1.35m^2. Blood pressure: 104/73. Patient status: Inpatient. Location: Bedside.  ------------------------------------------------------------  ------------------------------------------------------------ Left ventricle: The cavity size was moderately dilated. Wall thickness was normal. Systolic function was severely reduced. The estimated ejection fraction was in the range of 20% to 25%. Diffuse hypokinesis. Regional wall motion abnormalities: Akinesis of the inferior and apical myocardium.  ------------------------------------------------------------ Aortic valve: Trileaflet. Doppler: No significant regurgitation.  ------------------------------------------------------------ Aorta: The aorta was normal, not dilated, and non-diseased.  ------------------------------------------------------------ Mitral valve: Structurally normal valve. Leaflet separation was normal. Doppler: Transvalvular velocity was within the normal range. There was no evidence for stenosis. Moderate regurgitation. Peak gradient: 4mm Hg (D).  ------------------------------------------------------------ Left atrium: The atrium was moderately dilated.  ------------------------------------------------------------ Right ventricle: The cavity size was normal. Wall thickness was normal. Pacer  wire or catheter noted in right ventricle. Systolic function was normal.  ------------------------------------------------------------ Ventricular septum: Septal motion showed abnormal function and dyssynergy.  ------------------------------------------------------------ Pulmonic valve: Structurally normal valve. Cusp separation was normal. Doppler: Transvalvular velocity was within the normal range. No regurgitation.  ------------------------------------------------------------ Tricuspid valve: Mildly thickened leaflets.  ------------------------------------------------------------ Right atrium: The atrium was normal in size. Pacer wire or catheter noted in right atrium.  ------------------------------------------------------------ Pericardium: There was no pericardial effusion.  ------------------------------------------------------------ Post procedure conclusions Ascending Aorta:  - The aorta was normal, not dilated, and non-diseased.  ------------------------------------------------------------  2D measurements Normal Doppler Normal Left ventricle measurements LVID ED, 63.5 mm 43-52 Left ventricle chord, Ea, lat 4.48 cm/ ------- PLAX ann, tiss s LVID ES, 59.4 mm 23-38 DP chord, E/Ea, lat 20.94 ------- PLAX ann, tiss FS, chord, 6 % >29 DP PLAX Mitral valve LVPW, ED 12 mm ------ Peak E vel 93.8 cm/ ------- IVS/LVPW 0.55 <1.3 s ratio, ED Deceleratio 141 Dorothy 150-230 Ventricular septum n time IVS, ED 6.54 mm ------ Peak 4 mm ------- Aorta gradient, D Hg Root diam, 32 mm ------ Tricuspid valve ED Regurg peak 229 cm/ ------- Left atrium vel s AP dim 52 mm ------ Peak RV-RA 21 mm ------- AP dim 3.02 cm/m^2 <2.2 gradient, S Hg index  ------------------------------------------------------------ Prepared and Electronically Authenticated by  Peter Swaz[place], MD 2013-04-24T16:[id number]    Hospital Course See H&P, Labs, Consult and Test reports for all details. In  brief, [name] is a very pleasant 61 year old female with multiple comobidities, including ESRD initially on PD, admitted on 10/04/11 with abdominal pain and encephalopathy.  She was found to have perforated bowel, thought to be related to ruptured pancreatic pseudocyst, and to be septic, with blood cultures growing MRSA/Candida glabrata/Enterobacter cloacae (CONFIRMED AS A KPC CARBAPENEMASE PRODUCER IMIPENEM ). She had been on peritoneal dialysis which was changed  to HD, with plans to remove PD catheter. It is thought that the PD may be infected.  The decision was made not to pursue surgery for the perforation for now given patient's high risk as a surgical candidate, and the fact that she seems getting better. Her mentation has improved- seems back to baseline now- it was likely related to sepsis  and excessive medications with narcotic pain medications which were cut down after she was admitted. She also required a unit of blood. Appreciate all the assistance from renal/ccs/ir/neuro/cardilogy/id. Plan is for transfer to [facility] to continue management. We have kept her daughter [name] and family abreast of the developments of her condition and as to the seriousness of her condition.. Today [name] had upper endoscopy done to determine if there is any extravasation or if the hole has sealed. The patient always has chronic epigastric abdominal pain, so it's difficult right now to determine if the pain she has is worse secondary to this acute event. She says her belly pain is still there.  I have ordered to start clear liquids.   Active Hospital Problems  Diagnoses Date Noted   . Epigastric abdominal pain 10/04/2011   . Bowel perforation 10/13/2011   . Bacteremia due to Staphylococcus aureus 10/13/2011   . Fungemia 10/13/2011   . Enterobacter sepsis 10/13/2011   . Thrombocytopenia 10/13/2011   . Anemia 10/13/2011   . Pancreatitis chronic 10/04/2011   . Pancreatic pseudocyst 09/14/2011   . End stage renal  disease 06/03/2011   . Ischemic cardiomyopathy 12/01/2010   . Chronic systolic congestive heart failure 12/01/2010     Resolved Hospital Problems  Diagnoses Date Noted Date Resolved  . Hyperkalemia 10/04/2011 10/13/2011  . Hyponatremia 10/04/2011 10/13/2011     Today's Assessment:   Subjective:   [name] Pt is awake, alert, no distress, asking for more pain medications, asking to try clear liquids.  Objective:   Blood pressure 135/84, pulse 96, temperature 98.4 F (36.9 C), temperature source Oral, resp. rate 20, height 5\' 4"  (1.626 m), weight 61.417 kg (135 lb 6.4 oz), SpO2 100.00%.  Intake/Output Summary (Last 24 hours) at 10/14/11 1350 Last data filed at 10/14/11 0900  Gross per 24 hour  Intake    100 ml  Output      1 ml  Net     99 ml    Exam General: Much more awake and alert  CV: tachycardic, no mgr  CHest wall: pm pocket not fluctuant or tender  Pulm: BBS clear  GI: tender in LLQ near PD site Ext - no cyanosis  Cultures -  Blood culture resulted today Enterobacter cloacae (CONFIRMED AS A KPC CARBAPENEMASE PRODUCER IMIPENEM) sensitive to ciprofloxacin   Lab Results  Component Value Date   WBC 13.8* 10/14/2011   WBC 6.7 04/08/2011   HGB 8.8* 10/14/2011   HGB 10.1* 04/08/2011   HCT 27.7* 10/14/2011   HCT 30.1* 04/08/2011   PLT 124* 10/14/2011   PLT 232 04/08/2011   LYMPHOPCT 13 10/14/2011   LYMPHOPCT 20.6 04/08/2011   MONOPCT 10 10/14/2011   MONOPCT 6.3 04/08/2011   EOSPCT 1 10/14/2011   EOSPCT 3.6 04/08/2011   BASOPCT 1 10/14/2011   BASOPCT 0.2 04/08/2011   CMP: Lab Results  Component Value Date   NA 137 10/14/2011   K 3.5 10/14/2011   CL 102 10/14/2011   CO2 26 10/14/2011   BUN 20 10/14/2011   CREATININE 2.79* 10/14/2011   PROT 6.0 10/14/2011   ALBUMIN 1.2* 10/14/2011   BILITOT 0.8 10/14/2011   ALKPHOS 272* 10/14/2011   AST 23 10/14/2011   ALT 16 10/14/2011  .  Issues to Address for ongoing care: 1) Pt to resume Hemodialysis tomorrow. 2) Pt has positive blood culture  to Enterobacter cloacae resistant to imipenem (sensitive to ciprofloxacin).  Also blood culture positive for candida species.  3) Nutrition:  Pt started on trial of clear liquid diet today 4) General Surgery to reassess need for surgery regarding bowel perforation although pt seems to be improving on IV antibiotics 5) Pain management:  Now that pt is mentating better, she is starting to request more pain medications (as we had cut way back on pain meds when she was initially admitted because of obtundation) 6) Fungemia - Pt is on IV antifungal medications and followed by Infectious Diseases specialists 7) Abnormal UGI - Pt will need GI consultation and Upper endoscopy (please see results of UGI study)  8) Monitor blood glucose   DISCHARGE MEDICATIONS: PLEASE SEE DISCHARGE MAR RECORD IN [facility].      Disposition and Follow-up:  Transferring to [facility] for ongoing care.  I called and spoke with and updated the accepting physician Dr. Johny Chess who will assume care of the patient.    The risks, benefits, and possible side effects of all treatments and tests were explained to the patient.  The patient verbalized understanding.  The importance of close follow up with the primary care medical provider was explained clearly to the patient.  The patient verbalized understanding.  The patient was given instructions to return if symptoms recur, worsen or new changes develop.  The patient verbalized understanding.   Cleora Fleet, MD, CDE, FAAFP [facility] [place], [place]   Total Time spent reviewing critical document, reviewing this patient's comprehensive hospitalization, arranging follow up and coordination of care, reviewing data and todays exam greater than 50 minutes.   Signed: Nyree [name] 10/14/2011 1:50 PM

## 2011-10-14 NOTE — Progress Notes (Signed)
Patient ID: [name], female   DOB: 11-Nov-1950, 61 y.o.   MRN: [id number]    Subjective: Pt still c/o some abdominal pain.  Objective: Vital signs in last 24 hours: Temp:  [97.7 F (36.5 C)-98.3 F (36.8 C)] 97.9 F (36.6 C) (05/01 0550) Pulse Rate:  [86-106] 98  (05/01 0550) Resp:  [16-18] 17  (05/01 0550) BP: (97-135)/(68-92) 135/85 mmHg (05/01 0550) SpO2:  [94 %-98 %] 98 % (05/01 0550) Weight:  [135 lb 6.4 oz (61.417 kg)-137 lb 5.6 oz (62.3 kg)] 135 lb 6.4 oz (61.417 kg) (04/30 1958) Last BM Date: 10/13/11  Intake/Output from previous day: 04/30 0701 - 05/01 0700 In: 100 [IV Piggyback:100] Out: 1 [Stool:1] Intake/Output this shift:    PE: Abd: soft, tender in epigastrium, +BS, ND, no peritoneal signs  Lab Results:   Basename 10/14/11 0550 10/13/11 0600  WBC 13.8* 13.0*  HGB 8.8* 8.8*  HCT 27.7* 27.2*  PLT 124* 102*   BMET  Basename 10/14/11 0550 10/13/11 0600  NA 137 130*  K 3.5 3.1*  CL 102 93*  CO2 26 24  GLUCOSE 73 125*  BUN 20 45*  CREATININE 2.79* 4.37*  CALCIUM 8.4 8.8   PT/INR  Basename 10/13/11 0600  LABPROT 16.1*  INR 1.26   CMP     Component Value Date/Time   NA 137 10/14/2011 0550   K 3.5 10/14/2011 0550   CL 102 10/14/2011 0550   CO2 26 10/14/2011 0550   GLUCOSE 73 10/14/2011 0550   BUN 20 10/14/2011 0550   CREATININE 2.79* 10/14/2011 0550   CALCIUM 8.4 10/14/2011 0550   PROT 6.0 10/14/2011 0550   ALBUMIN 1.2* 10/14/2011 0550   AST 23 10/14/2011 0550   ALT 16 10/14/2011 0550   ALKPHOS 272* 10/14/2011 0550   BILITOT 0.8 10/14/2011 0550   GFRNONAA 17* 10/14/2011 0550   GFRAA 20* 10/14/2011 0550   Lipase     Component Value Date/Time   LIPASE 15 10/08/2011 0500       Studies/Results: Ir Fluoro Guide Cv Line Right  10/12/2011  *RADIOLOGY REPORT*  Clinical Data: End-stage renal disease and complications from peritoneal dialysis resulting in bacteremia.  The patient requires a temporary non-tunneled dialysis catheter currently as a bridge between  peritoneal dialysis and hemodialysis.  NON-TUNNELED CENTRAL VENOUS CATHETER PLACEMENT WITH ULTRASOUND AND FLUOROSCOPIC GUIDANCE  Fluoroscopy Time:  0.3 minutes.  Procedure:  The procedure, risks, benefits, and alternatives were explained to the patient.  Questions regarding the procedure were encouraged and answered.  The patient understands and consents to the procedure.  The right neck and chest were prepped with chlorhexidine in a sterile fashion, and a sterile drape was applied covering the operative field.  Maximum barrier sterile technique with sterile gowns and gloves were used for the procedure.  Local anesthesia was provided with 1% lidocaine.  After creating a small venotomy incision, a 21 gauge needle was advanced into the right internal jugular vein under direct, real- time ultrasound guidance.  Ultrasound image documentation was performed. The venotomy was dilated over a wire.  Over a wire, a 20 cm Bard Trialysis non tunneled catheter was placed.  Final catheter positioning was confirmed and documented with a fluoroscopic spot image.  The catheter was aspirated, flushed with saline, and injected with appropriate volume heparin dwells.  The catheter exit site was secured with 0-Prolene retention sutures.  Complications: None.  No pneumothorax.  Findings:  After catheter placement, the tip lies just in the superior right atrium.  The catheter aspirates normally and is ready for immediate use.  IMPRESSION:  Placement of non-tunneled hemodialysis catheter via the right internal jugular vein.  The catheter tip lies in the right atrium. The catheter is ready for immediate use.  Original Report Authenticated By: Reola Calkins, M.D.   Ir US Guide Vasc Access Right  10/12/2011  *RADIOLOGY REPORT*  Clinical Data: End-stage renal disease and complications from peritoneal dialysis resulting in bacteremia.  The patient requires a temporary non-tunneled dialysis catheter currently as a bridge between  peritoneal dialysis and hemodialysis.  NON-TUNNELED CENTRAL VENOUS CATHETER PLACEMENT WITH ULTRASOUND AND FLUOROSCOPIC GUIDANCE  Fluoroscopy Time:  0.3 minutes.  Procedure:  The procedure, risks, benefits, and alternatives were explained to the patient.  Questions regarding the procedure were encouraged and answered.  The patient understands and consents to the procedure.  The right neck and chest were prepped with chlorhexidine in a sterile fashion, and a sterile drape was applied covering the operative field.  Maximum barrier sterile technique with sterile gowns and gloves were used for the procedure.  Local anesthesia was provided with 1% lidocaine.  After creating a small venotomy incision, a 21 gauge needle was advanced into the right internal jugular vein under direct, real- time ultrasound guidance.  Ultrasound image documentation was performed. The venotomy was dilated over a wire.  Over a wire, a 20 cm Bard Trialysis non tunneled catheter was placed.  Final catheter positioning was confirmed and documented with a fluoroscopic spot image.  The catheter was aspirated, flushed with saline, and injected with appropriate volume heparin dwells.  The catheter exit site was secured with 0-Prolene retention sutures.  Complications: None.  No pneumothorax.  Findings:  After catheter placement, the tip lies just in the superior right atrium.  The catheter aspirates normally and is ready for immediate use.  IMPRESSION:  Placement of non-tunneled hemodialysis catheter via the right internal jugular vein.  The catheter tip lies in the right atrium. The catheter is ready for immediate use.  Original Report Authenticated By: Reola Calkins, M.D.    Anti-infectives: Anti-infectives     Start     Dose/Rate Route Frequency Ordered Stop   10/13/11 1200   vancomycin (VANCOCIN) 750 mg in sodium chloride 0.9 % 150 mL IVPB        750 mg 150 mL/hr over 60 Minutes Intravenous Every Tue (Hemodialysis) 10/13/11 1000  10/13/11 1114   10/12/11 2200   meropenem (MERREM) 1 g in sodium chloride 0.9 % 100 mL IVPB        1 g 200 mL/hr over 30 Minutes Intravenous Every 24 hours 10/12/11 0939     10/12/11 1300   rifampin (RIFADIN) 300 mg in sodium chloride 0.9 % 100 mL IVPB        300 mg 200 mL/hr over 30 Minutes Intravenous Every 12 hours 10/12/11 1154     10/11/11 1200   meropenem (MERREM) 500 mg in sodium chloride 0.9 % 50 mL IVPB  Status:  Discontinued        500 mg 100 mL/hr over 30 Minutes Intravenous Every 24 hours 10/11/11 1138 10/12/11 0939   10/09/11 1400   fluconazole (DIFLUCAN) tablet 100 mg  Status:  Discontinued        100 mg Oral Daily 10/09/11 1237 10/09/11 1312   10/09/11 1400   micafungin (MYCAMINE) 100 mg in sodium chloride 0.9 % 100 mL IVPB        100 mg 100 mL/hr over 1 Hours Intravenous  Every 24 hours 10/09/11 1313     10/09/11 1300   cefTAZidime (FORTAZ) 500 mg in dextrose 5 % 50 mL IVPB  Status:  Discontinued        500 mg 100 mL/hr over 30 Minutes Intravenous Every 24 hours 10/09/11 0918 10/11/11 1028   10/08/11 1300   cefTAZidime (FORTAZ) 1 g in dextrose 5 % 50 mL IVPB  Status:  Discontinued        1 g 100 mL/hr over 30 Minutes Intravenous Every 24 hours 10/08/11 1151 10/09/11 0917   10/07/11 1000   piperacillin-tazobactam (ZOSYN) IVPB 2.25 g  Status:  Discontinued        2.25 g 100 mL/hr over 30 Minutes Intravenous 3 times per day 10/07/11 0927 10/08/11 1145   10/07/11 0930   vancomycin (VANCOCIN) 1,750 mg in sodium chloride 0.9 % 500 mL IVPB        1,750 mg 250 mL/hr over 120 Minutes Intravenous  Once 10/07/11 0927 10/07/11 1241   10/05/11 0300   vancomycin (VANCOCIN) 1,750 mg in sodium chloride 0.9 % 500 mL IVPB        1,750 mg 250 mL/hr over 120 Minutes Intravenous  Once 10/05/11 0204 10/05/11 0500   10/04/11 0300   Ampicillin-Sulbactam (UNASYN) 3 g in sodium chloride 0.9 % 100 mL IVPB  Status:  Discontinued     Comments: Unasyn 3 g IV q24h on Peritoneal Dialysis        3 g 100 mL/hr over 60 Minutes Intravenous Daily at bedtime 10/04/11 0151 10/07/11 0922           Assessment/Plan  1. Gastric perforation likely secondary from pseudocyst 2. ESRD  Plan: 1. Will get an UGI today to determine if there is any extravasation or if the hole has sealed.  The patient always has chronic epigastric abdominal pain, so it's difficult right now to determine if the pain she has is worse secondary to this acute event.  Cont NPO for now until we can get UGI.  Transfer to [facility] is pending bed availability.   LOS: 11 days    Arsal Tappan E 10/14/2011

## 2011-10-16 LAB — CULTURE, BLOOD (ROUTINE X 2)
Culture  Setup Time: 201304271131
Culture: NO GROWTH

## 2011-10-19 ENCOUNTER — Telehealth: Payer: Self-pay | Admitting: Internal Medicine

## 2011-10-19 DIAGNOSIS — K922 Gastrointestinal hemorrhage, unspecified: Secondary | ICD-10-CM | POA: Insufficient documentation

## 2011-10-19 LAB — CULTURE, BLOOD (ROUTINE X 2)

## 2011-10-19 NOTE — Telephone Encounter (Signed)
Called via the hospital operator about [name] by GI MD and general surgeon at [facility] She had a prolonged hospital stay at [facility] in April and was eventually transferred to the [facility]. Developed pseudocyst complicated by hemorrhage in to the cyst cavity.   Complicated issues included septic shock. Pseudocyst apparently eroded into stomach (cystgastrostomy) with repeat bleeding. EGD today revealed clot in antrum.  No endoscopic intervention felt possible. 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 Now is seems she needs tertiary care with advanced pancreaticobiliary team.  Therefore transfer to [facility] or [facility] seems best for the patient. General surgeon in agreement, and this will likely be the plan I did not give direct medical advice about specific care by phone.

## 2011-11-03 DIAGNOSIS — K922 Gastrointestinal hemorrhage, unspecified: Secondary | ICD-10-CM | POA: Insufficient documentation

## 2011-11-09 DIAGNOSIS — I495 Sick sinus syndrome: Secondary | ICD-10-CM | POA: Insufficient documentation

## 2011-11-09 DIAGNOSIS — I499 Cardiac arrhythmia, unspecified: Secondary | ICD-10-CM | POA: Insufficient documentation

## 2012-02-14 DIAGNOSIS — J189 Pneumonia, unspecified organism: Secondary | ICD-10-CM

## 2012-02-14 HISTORY — DX: Pneumonia, unspecified organism: J18.9

## 2012-02-23 ENCOUNTER — Emergency Department (HOSPITAL_COMMUNITY): Payer: Non-veteran care

## 2012-02-23 ENCOUNTER — Inpatient Hospital Stay (HOSPITAL_COMMUNITY)
Admission: EM | Admit: 2012-02-23 | Discharge: 2012-02-25 | DRG: 438 | Disposition: A | Discharge status: Home / Self Care | Payer: Non-veteran care | Attending: Internal Medicine | Admitting: Internal Medicine

## 2012-02-23 ENCOUNTER — Encounter (HOSPITAL_COMMUNITY): Payer: Self-pay | Admitting: *Deleted

## 2012-02-23 DIAGNOSIS — Z951 Presence of aortocoronary bypass graft: Secondary | ICD-10-CM

## 2012-02-23 DIAGNOSIS — I5023 Acute on chronic systolic (congestive) heart failure: Secondary | ICD-10-CM | POA: Diagnosis present

## 2012-02-23 DIAGNOSIS — R188 Other ascites: Secondary | ICD-10-CM

## 2012-02-23 DIAGNOSIS — J9601 Acute respiratory failure with hypoxia: Secondary | ICD-10-CM

## 2012-02-23 DIAGNOSIS — J9600 Acute respiratory failure, unspecified whether with hypoxia or hypercapnia: Secondary | ICD-10-CM | POA: Diagnosis present

## 2012-02-23 DIAGNOSIS — I255 Ischemic cardiomyopathy: Secondary | ICD-10-CM

## 2012-02-23 DIAGNOSIS — R109 Unspecified abdominal pain: Secondary | ICD-10-CM

## 2012-02-23 DIAGNOSIS — K631 Perforation of intestine (nontraumatic): Secondary | ICD-10-CM

## 2012-02-23 DIAGNOSIS — B49 Unspecified mycosis: Secondary | ICD-10-CM

## 2012-02-23 DIAGNOSIS — K7460 Unspecified cirrhosis of liver: Secondary | ICD-10-CM | POA: Diagnosis present

## 2012-02-23 DIAGNOSIS — I8510 Secondary esophageal varices without bleeding: Secondary | ICD-10-CM

## 2012-02-23 DIAGNOSIS — N185 Chronic kidney disease, stage 5: Secondary | ICD-10-CM

## 2012-02-23 DIAGNOSIS — D72829 Elevated white blood cell count, unspecified: Secondary | ICD-10-CM

## 2012-02-23 DIAGNOSIS — I2589 Other forms of chronic ischemic heart disease: Secondary | ICD-10-CM | POA: Diagnosis present

## 2012-02-23 DIAGNOSIS — R7309 Other abnormal glucose: Secondary | ICD-10-CM | POA: Diagnosis not present

## 2012-02-23 DIAGNOSIS — N189 Chronic kidney disease, unspecified: Secondary | ICD-10-CM

## 2012-02-23 DIAGNOSIS — B9561 Methicillin susceptible Staphylococcus aureus infection as the cause of diseases classified elsewhere: Secondary | ICD-10-CM

## 2012-02-23 DIAGNOSIS — K862 Cyst of pancreas: Secondary | ICD-10-CM | POA: Diagnosis present

## 2012-02-23 DIAGNOSIS — Z8249 Family history of ischemic heart disease and other diseases of the circulatory system: Secondary | ICD-10-CM

## 2012-02-23 DIAGNOSIS — I2510 Atherosclerotic heart disease of native coronary artery without angina pectoris: Secondary | ICD-10-CM | POA: Diagnosis present

## 2012-02-23 DIAGNOSIS — R1013 Epigastric pain: Secondary | ICD-10-CM

## 2012-02-23 DIAGNOSIS — D631 Anemia in chronic kidney disease: Secondary | ICD-10-CM

## 2012-02-23 DIAGNOSIS — I5022 Chronic systolic (congestive) heart failure: Secondary | ICD-10-CM

## 2012-02-23 DIAGNOSIS — K861 Other chronic pancreatitis: Principal | ICD-10-CM

## 2012-02-23 DIAGNOSIS — K863 Pseudocyst of pancreas: Secondary | ICD-10-CM

## 2012-02-23 DIAGNOSIS — E871 Hypo-osmolality and hyponatremia: Secondary | ICD-10-CM | POA: Diagnosis present

## 2012-02-23 DIAGNOSIS — I120 Hypertensive chronic kidney disease with stage 5 chronic kidney disease or end stage renal disease: Secondary | ICD-10-CM | POA: Diagnosis present

## 2012-02-23 DIAGNOSIS — A4159 Other Gram-negative sepsis: Secondary | ICD-10-CM

## 2012-02-23 DIAGNOSIS — R7881 Bacteremia: Secondary | ICD-10-CM

## 2012-02-23 DIAGNOSIS — Z87891 Personal history of nicotine dependence: Secondary | ICD-10-CM

## 2012-02-23 DIAGNOSIS — E46 Unspecified protein-calorie malnutrition: Secondary | ICD-10-CM | POA: Diagnosis present

## 2012-02-23 DIAGNOSIS — Z90710 Acquired absence of both cervix and uterus: Secondary | ICD-10-CM

## 2012-02-23 DIAGNOSIS — I509 Heart failure, unspecified: Secondary | ICD-10-CM | POA: Diagnosis present

## 2012-02-23 DIAGNOSIS — Z833 Family history of diabetes mellitus: Secondary | ICD-10-CM

## 2012-02-23 DIAGNOSIS — I10 Essential (primary) hypertension: Secondary | ICD-10-CM

## 2012-02-23 DIAGNOSIS — D649 Anemia, unspecified: Secondary | ICD-10-CM

## 2012-02-23 DIAGNOSIS — I447 Left bundle-branch block, unspecified: Secondary | ICD-10-CM

## 2012-02-23 DIAGNOSIS — Z66 Do not resuscitate: Secondary | ICD-10-CM | POA: Diagnosis present

## 2012-02-23 DIAGNOSIS — Z992 Dependence on renal dialysis: Secondary | ICD-10-CM

## 2012-02-23 DIAGNOSIS — D696 Thrombocytopenia, unspecified: Secondary | ICD-10-CM

## 2012-02-23 DIAGNOSIS — I81 Portal vein thrombosis: Secondary | ICD-10-CM

## 2012-02-23 DIAGNOSIS — R0602 Shortness of breath: Secondary | ICD-10-CM | POA: Diagnosis present

## 2012-02-23 DIAGNOSIS — Z9089 Acquired absence of other organs: Secondary | ICD-10-CM

## 2012-02-23 DIAGNOSIS — N186 End stage renal disease: Secondary | ICD-10-CM

## 2012-02-23 LAB — CBC WITH DIFFERENTIAL/PLATELET
Basophils Absolute: 0 K/uL (ref 0.0–0.1)
Basophils Relative: 0 % (ref 0–1)
Eosinophils Absolute: 0.1 K/uL (ref 0.0–0.7)
Eosinophils Relative: 0 % (ref 0–5)
HCT: 24.7 % — ABNORMAL LOW (ref 36.0–46.0)
Hemoglobin: 7.2 g/dL — ABNORMAL LOW (ref 12.0–15.0)
Lymphocytes Relative: 9 % — ABNORMAL LOW (ref 12–46)
Lymphs Abs: 1.4 K/uL (ref 0.7–4.0)
MCH: 24.6 pg — ABNORMAL LOW (ref 26.0–34.0)
MCHC: 29.1 g/dL — ABNORMAL LOW (ref 30.0–36.0)
MCV: 84.3 fL (ref 78.0–100.0)
Monocytes Absolute: 1 K/uL (ref 0.1–1.0)
Monocytes Relative: 6 % (ref 3–12)
Neutro Abs: 13.3 K/uL — ABNORMAL HIGH (ref 1.7–7.7)
Neutrophils Relative %: 85 % — ABNORMAL HIGH (ref 43–77)
Platelets: 271 K/uL (ref 150–400)
RBC: 2.93 MIL/uL — ABNORMAL LOW (ref 3.87–5.11)
RDW: 18.2 % — ABNORMAL HIGH (ref 11.5–15.5)
WBC: 15.7 K/uL — ABNORMAL HIGH (ref 4.0–10.5)

## 2012-02-23 LAB — COMPREHENSIVE METABOLIC PANEL
ALT: 7 U/L (ref 0–35)
AST: 14 U/L (ref 0–37)
Calcium: 10.8 mg/dL — ABNORMAL HIGH (ref 8.4–10.5)
Creatinine, Ser: 6 mg/dL — ABNORMAL HIGH (ref 0.50–1.10)
GFR calc Af Amer: 8 mL/min — ABNORMAL LOW (ref >90)
Sodium: 132 mEq/L — ABNORMAL LOW (ref 135–145)
Total Protein: 7.6 g/dL (ref 6.0–8.3)

## 2012-02-23 LAB — PREPARE RBC (CROSSMATCH)

## 2012-02-23 LAB — LIPASE, BLOOD: Lipase: 10 U/L — ABNORMAL LOW (ref 11–59)

## 2012-02-23 MED ORDER — HYDROMORPHONE HCL PF 1 MG/ML IJ SOLN
0.5000 mg | Freq: Once | INTRAMUSCULAR | Status: AC
  Administered 2012-02-23: 0.5 mg via INTRAVENOUS
  Filled 2012-02-23: qty 1

## 2012-02-23 MED ORDER — ONDANSETRON HCL 4 MG/2ML IJ SOLN
4.0000 mg | Freq: Once | INTRAMUSCULAR | Status: AC
  Administered 2012-02-23: 4 mg via INTRAVENOUS
  Filled 2012-02-23: qty 2

## 2012-02-23 MED ORDER — HYDROMORPHONE HCL PF 1 MG/ML IJ SOLN
0.5000 mg | Freq: Once | INTRAMUSCULAR | Status: AC
  Administered 2012-02-23 – 2012-02-24 (×2): 0.5 mg via INTRAVENOUS
  Filled 2012-02-23: qty 1

## 2012-02-23 MED ORDER — IOHEXOL 300 MG/ML  SOLN
20.0000 mL | INTRAMUSCULAR | Status: AC
  Administered 2012-02-23 (×2): 20 mL via ORAL

## 2012-02-23 NOTE — ED Provider Notes (Signed)
History     CSN: [id number]  Arrival date & time 02/23/12  1442   First MD Initiated Contact with Patient 02/23/12 1739      Chief Complaint  Patient presents with  . Shortness of Breath  . Leg Problem  . Abdominal Pain    (Consider location/radiation/quality/duration/timing/severity/associated sxs/prior treatment) HPI Comments: Patient presents today with a past medical history of anemia, ESRD on dialysis, CHF, with the chief complaint of leg cramps, shortness of breath and abdominal pain. She reports these symptoms began about 3 weeks ago and have progressively worsened. This morning she had trouble walking with her walker and felt extremely weak, more than usual. Her shortness of breath was also worse this morning and is worse with exertion. She denies cough and chest tightness. Her abdominal pain began about 3 weeks ago and has not improved. She thinks this is related to pancreatitis and states it moves around. She reports nausea and vomiting but denies blood in her vomit. She denies changes in bowel movements or blood in her stool. She denies chest pain, palpitations, headache and fever. She went to dialysis yesterday and session was normal for her as far as duration. She denies alcohol and tobacco use. The onset of these symptoms has been gradual. Nothing makes symptoms better.   Patient is a 61 y.o. female presenting with shortness of breath and abdominal pain. The history is provided by the patient.  Shortness of Breath  The current episode started more than 2 weeks ago. The onset was gradual. The problem has been unchanged. The symptoms are aggravated by activity. Associated symptoms include shortness of breath. Pertinent negatives include no chest pain, no chest pressure, no fever, no rhinorrhea, no sore throat and no cough. There were no sick contacts.  Abdominal Pain The primary symptoms of the illness include abdominal pain, shortness of breath, nausea and vomiting. The primary  symptoms of the illness do not include fever, diarrhea, hematemesis, hematochezia or dysuria. The current episode started more than 2 days ago (3 weeks ago). The onset of the illness was gradual. The problem has not changed since onset. Symptoms associated with the illness do not include chills, heartburn or constipation. Significant associated medical issues include diabetes, gallstones (cholecystectomy) and liver disease.    Past Medical History  Diagnosis Date  . Gallstone pancreatitis   . Coronary artery disease     s/p CABG 2008 with multiple PCIs  . Ischemic cardiomyopathy   . End stage renal disease   . Diabetes mellitus   . Hypertension   . History of nonadherence to medical treatment   . LBBB (left bundle branch block)   . Hyperlipidemia   . Renal insufficiency   . CHF (congestive heart failure)   . Dialysis patient   . Anemia     Past Surgical History  Procedure Date  . Coronary artery bypass graft 2008  . Cholecystectomy   . Abdominal hysterectomy   . Tubal ligation   . Tonsillectomy   . Insert / replace / remove pacemaker     pacemaker ICD  . Esophagogastroduodenoscopy 09/15/2011    Procedure: ESOPHAGOGASTRODUODENOSCOPY (EGD);  Surgeon: Theda Belfast, MD;  Location: [facility] ENDOSCOPY;  Service: Endoscopy;  Laterality: N/A;    Family History  Problem Relation Age of Onset  . Coronary artery disease Mother   . Hypertension Mother   . Diabetes Mother   . Diabetes Sister   . Anesthesia problems Neg Hx     History  Substance Use Topics  .  Smoking status: Former Smoker    Quit date: 06/15/2000  . Smokeless tobacco: Not on file  . Alcohol Use: No    OB History    Grav Para Term Preterm Abortions TAB SAB Ect Mult Living                  Review of Systems  Constitutional: Negative for fever and chills.  HENT: Negative for sore throat and rhinorrhea.   Eyes: Negative for redness.  Respiratory: Positive for shortness of breath. Negative for cough and chest  tightness.   Cardiovascular: Positive for leg swelling (improved, bilateral, typical for patient). Negative for chest pain.  Gastrointestinal: Positive for nausea, vomiting and abdominal pain. Negative for heartburn, diarrhea, constipation, blood in stool, hematochezia and hematemesis.  Genitourinary: Negative for dysuria.  Musculoskeletal: Negative for myalgias and gait problem.  Skin: Negative for rash.  Neurological: Positive for weakness (in legs bilaterally). Negative for light-headedness and headaches.  Hematological: Does not bruise/bleed easily.    Allergies  Morphine and related; Codeine; and Lisinopril  Home Medications   Current Outpatient Rx  Name Route Sig Dispense Refill  . RENA-VITE PO TABS Oral Take 1 tablet by mouth at bedtime. 30 tablet 0  . PRAVASTATIN SODIUM 20 MG PO TABS Oral Take 20 mg by mouth daily.      [name] RANOLAZINE ER 500 MG PO TB12 Oral Take 500 mg by mouth daily.      [name] SEVELAMER HCL 800 MG PO TABS Oral Take 800 mg by mouth 3 (three) times daily with meals.       BP 137/74  Pulse 90  Temp 99 F (37.2 C) (Oral)  Resp 14  SpO2 98%  Physical Exam  Nursing note and vitals reviewed. Constitutional: She appears well-developed and well-nourished.       In no acute distress  HENT:  Head: Normocephalic and atraumatic.  Eyes: Conjunctivae are normal. Right eye exhibits no discharge. Left eye exhibits no discharge.  Neck: Normal range of motion. Neck supple.  Cardiovascular: Normal rate, regular rhythm and normal heart sounds.   No murmur heard. Pulmonary/Chest: Effort normal and breath sounds normal. No respiratory distress. She has no wheezes. She has no rales.  Abdominal: Soft. Bowel sounds are normal. She exhibits no distension and no abdominal bruit. There is generalized tenderness. There is no tenderness at McBurney's point and negative Murphy's sign.  Musculoskeletal: She exhibits edema. She exhibits no tenderness.       2+ pitting edema of bilateral  lower extremities.   Neurological: She is alert.  Skin: Skin is warm and dry. No rash noted. No erythema.  Psychiatric: She has a normal mood and affect.    ED Course  Procedures (including critical care time)  Labs Reviewed  CBC WITH DIFFERENTIAL - Abnormal; Notable for the following:    WBC 15.7 (*)     RBC 2.93 (*)     Hemoglobin 7.2 (*)     HCT 24.7 (*)     MCH 24.6 (*)     MCHC 29.1 (*)     RDW 18.2 (*)     Neutrophils Relative 85 (*)     Neutro Abs 13.3 (*)     Lymphocytes Relative 9 (*)     All other components within normal limits  COMPREHENSIVE METABOLIC PANEL - Abnormal; Notable for the following:    Sodium 132 (*)     Chloride 93 (*)     BUN 53 (*)     Creatinine,  Ser 6.00 (*)     Calcium 10.8 (*)     Albumin 2.3 (*)     GFR calc non Af Amer 7 (*)     GFR calc Af Amer 8 (*)     All other components within normal limits  LIPASE, BLOOD - Abnormal; Notable for the following:    Lipase 10 (*)     All other components within normal limits  PREPARE RBC (CROSSMATCH)  TYPE AND SCREEN   Dg Chest 2 View  02/23/2012  *RADIOLOGY REPORT*  Clinical Data: Short of breath.  Congestive heart failure.  On dialysis.  CHEST - 2 VIEW  Comparison: 10/06/2011  Findings: Improved aeration of both lungs is seen.  Mild improvement in bilateral airspace disease is seen consistent with improving pulmonary edema.  Cardiomegaly stable.  A new right jugular dual lumen center venous catheter is seen with tips within the right atrium.  No pneumothorax identified. AICD device remains in appropriate position.  Prior CABG again noted.  IMPRESSION: Improved aeration of both lungs and mild improvement in pulmonary edema since prior study.  Stable cardiomegaly.   Original Report Authenticated By: Danae Orleans, M.D.    Ct Abdomen Pelvis W Contrast  02/24/2012  *RADIOLOGY REPORT*  Clinical Data: Lower extremity cramping.  Shortness of breath. Abdominal pain.  Nausea.  Dialysis patient.  CT ABDOMEN AND  PELVIS WITH CONTRAST  Technique:  Multidetector CT imaging of the abdomen and pelvis was performed following the standard protocol during bolus administration of intravenous contrast.  Contrast: 80mL OMNIPAQUE IOHEXOL 300 MG/ML  SOLN  Comparison: 10/10/2011  Findings: Small right pleural effusion with infiltration or edema in the lung bases.  Diffuse cardiac enlargement.  Coronary artery calcifications with postoperative change in the mediastinum.  Surgical absence of the gallbladder.  Heterogeneous parenchymal enhancement throughout the liver may be due to early phase of contrast administration but infiltrative process such as cirrhosis or neoplasm is not excluded.  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  Diffuse upper abdominal fluid collection.  Densities are indeterminate, measuring 5-20 HU. This is probably related to ascites but hemorrhage, peritonitis, or proteinaceous fluid could have this appearance.  There is edema throughout the subcutaneous fat and mesenteric fat. The pancreas is poorly visualized due to surrounding fluid and edema.  The kidneys are atrophic with sub centimeter cysts.  No hydronephrosis.  There appear to be enlarged lymph nodes in the periaortic regions, stable since the previous study.  Calcification throughout the abdominal aorta and branch vessels.  Flow is demonstrated in the mesenteric artery.  The stomach is decompressed.  There are focal gas collections in the gastrosplenic ligament.  The previous study demonstrated perforation and given the presence of these gas collections, residual perforation is not excluded.  Abscess or infection is not excluded.  No small bowel dilatation.  Contrast material flows through to the colon.  Pelvis:  Pelvic fluid collections are demonstrated with low attenuation suggesting ascites.  The bladder is decompressed.  Diverticula in the sigmoid colon without diverticulitis.  The appendix is not identified.  IMPRESSION: Small right pleural effusion with infiltration or edema in the lung bases.  Diffuse abdominal fluid collection in measurements. Changes are likely due to ascites but complex fluid collection, hemorrhage, or infection are not excluded.  Small gas bubbles in the gastrosplenic ligament may represent residual perforation or infection.  Abnormal hepatic parenchymal  enhancement which could represent infiltrative process or may be related to portal venous thrombosis.   Original Report Authenticated By: Marlon Pel, M.D.      1. Anemia   2. Abdominal pain     6:40 PM Patient seen and examined. Work-up initiated/reviewed. Medications ordered. Patient d/w Dr. Preston Fleeting who will see. Transfusion ordered. CXR reviewed, there is some pulmonary edema but improved from previous. No crackles on exam. Will monitor.   Vital signs reviewed and are as follows: Filed Vitals:   02/23/12 1803  BP: 137/74  Pulse: 90  Temp: 99 F (37.2 C)  Resp: 14   Dr. Preston Fleeting has seen. Will obtain CT given pain and previous medical history.   Given CT findings -- Dr. Luisa Hart was consulted and has seen patient. No acute surgical problems in his opinion.   Will admit to [facility] for blood transfusion in setting of ESRD.   2:08 AM Patient updated on plan. [facility] will see patient, temp orders placed. Nephrology paged to ask to arrange dialysis tomorrow. Dr. Radford Pax aware of patient and plan.     MDM  Admit for symptomatic anemia in setting of ESRD.   Surgery has seen to address abdominal complaints.         [place], [place] 02/24/12 [phone]

## 2012-02-23 NOTE — ED Notes (Signed)
Patient transported to CT 

## 2012-02-23 NOTE — ED Notes (Signed)
Pt's O2 sat ranging from 86-90%.  Placed pt on 2L [place].

## 2012-02-23 NOTE — ED Provider Notes (Signed)
61 year old female on hemodialysis presents with difficulty breathing and abdominal pain. Abdominal pain has been present for several weeks, and difficulty breathing has also been present for several days. She's somewhat poor historian. On exam, she is noted to have significant neck vein distention, decreased breath sounds of both lung bases, moderate to severe left-sided abdominal pain with some voluntary guarding, and 2-3+ pitting edema. 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 WBC is noted to be moderately elevated at over 15,000. She will need CT scan to evaluate her abdominal complaints. Her chest x-ray actually shows improved lung aeration.  Dione Booze, MD 02/23/12 2024

## 2012-02-23 NOTE — ED Notes (Signed)
Pt is a dialysis patient and does not urinate.

## 2012-02-23 NOTE — ED Notes (Signed)
Pt reports lower extremity cramping, reports fluid to lower extremities. Pt reports sob with ambulation. Pt reports lower bilateral abdominal pain. Reports nausea. Denies diarrhea. Pt is mon wed Friday dialysis pt, access to right chest wall. Pt went to dialysis this am.

## 2012-02-24 ENCOUNTER — Inpatient Hospital Stay (HOSPITAL_COMMUNITY): Payer: Non-veteran care

## 2012-02-24 ENCOUNTER — Encounter (HOSPITAL_COMMUNITY): Payer: Self-pay | Admitting: Family Medicine

## 2012-02-24 DIAGNOSIS — R0602 Shortness of breath: Secondary | ICD-10-CM

## 2012-02-24 DIAGNOSIS — K859 Acute pancreatitis without necrosis or infection, unspecified: Secondary | ICD-10-CM

## 2012-02-24 DIAGNOSIS — D72829 Elevated white blood cell count, unspecified: Secondary | ICD-10-CM | POA: Diagnosis present

## 2012-02-24 DIAGNOSIS — J9601 Acute respiratory failure with hypoxia: Secondary | ICD-10-CM | POA: Diagnosis present

## 2012-02-24 DIAGNOSIS — R188 Other ascites: Secondary | ICD-10-CM | POA: Diagnosis present

## 2012-02-24 DIAGNOSIS — R109 Unspecified abdominal pain: Secondary | ICD-10-CM

## 2012-02-24 DIAGNOSIS — D649 Anemia, unspecified: Secondary | ICD-10-CM

## 2012-02-24 DIAGNOSIS — I81 Portal vein thrombosis: Secondary | ICD-10-CM | POA: Diagnosis present

## 2012-02-24 DIAGNOSIS — I8510 Secondary esophageal varices without bleeding: Secondary | ICD-10-CM | POA: Diagnosis present

## 2012-02-24 LAB — IRON AND TIBC: UIBC: 133 ug/dL (ref 125–400)

## 2012-02-24 LAB — CBC
MCV: 84.1 fL (ref 78.0–100.0)
Platelets: 214 10*3/uL (ref 150–400)
RBC: 2.9 MIL/uL — ABNORMAL LOW (ref 3.87–5.11)
WBC: 11.3 10*3/uL — ABNORMAL HIGH (ref 4.0–10.5)

## 2012-02-24 LAB — GLUCOSE, CAPILLARY
Glucose-Capillary: 172 mg/dL — ABNORMAL HIGH (ref 70–99)
Glucose-Capillary: 140 mg/dL — ABNORMAL HIGH (ref 70–99)

## 2012-02-24 LAB — COMPREHENSIVE METABOLIC PANEL
ALT: 7 U/L (ref 0–35)
AST: 14 U/L (ref 0–37)
Alkaline Phosphatase: 100 U/L (ref 39–117)
CO2: 25 mEq/L (ref 19–32)
Chloride: 94 mEq/L — ABNORMAL LOW (ref 96–112)
Creatinine, Ser: 6.3 mg/dL — ABNORMAL HIGH (ref 0.50–1.10)
GFR calc non Af Amer: 6 mL/min — ABNORMAL LOW (ref >90)
Potassium: 3.8 mEq/L (ref 3.5–5.1)
Total Bilirubin: 0.6 mg/dL (ref 0.3–1.2)

## 2012-02-24 MED ORDER — HYDROMORPHONE HCL PF 1 MG/ML IJ SOLN
0.5000 mg | INTRAMUSCULAR | Status: DC | PRN
  Administered 2012-02-24 (×3): 0.5 mg via INTRAVENOUS
  Filled 2012-02-24 (×3): qty 1

## 2012-02-24 MED ORDER — HYDROMORPHONE HCL PF 1 MG/ML IJ SOLN
0.5000 mg | INTRAMUSCULAR | Status: DC | PRN
  Administered 2012-02-24: 0.5 mg via INTRAVENOUS
  Filled 2012-02-24: qty 1

## 2012-02-24 MED ORDER — SODIUM CHLORIDE 0.9 % IJ SOLN
3.0000 mL | Freq: Two times a day (BID) | INTRAMUSCULAR | Status: DC
  Administered 2012-02-24 – 2012-02-25 (×2): 3 mL via INTRAVENOUS

## 2012-02-24 MED ORDER — HYDROMORPHONE HCL PF 1 MG/ML IJ SOLN
INTRAMUSCULAR | Status: AC
  Administered 2012-02-24: 0.5 mg via INTRAVENOUS
  Filled 2012-02-24: qty 1

## 2012-02-24 MED ORDER — DEXTROSE 50 % IV SOLN
25.0000 mL | Freq: Once | INTRAVENOUS | Status: AC | PRN
  Administered 2012-02-24: 25 mL via INTRAVENOUS

## 2012-02-24 MED ORDER — DEXTROSE 50 % IV SOLN
INTRAVENOUS | Status: AC
  Filled 2012-02-24: qty 50

## 2012-02-24 MED ORDER — ONDANSETRON HCL 4 MG/2ML IJ SOLN: 4.0000 mg | Freq: Three times a day (TID) | INTRAMUSCULAR | Status: DC | PRN

## 2012-02-24 MED ORDER — DARBEPOETIN ALFA-POLYSORBATE 100 MCG/0.5ML IJ SOLN: 100.0000 ug | INTRAMUSCULAR | Status: DC

## 2012-02-24 MED ORDER — HYDROMORPHONE HCL PF 1 MG/ML IJ SOLN: 0.5000 mg | INTRAMUSCULAR | Status: DC | PRN

## 2012-02-24 MED ORDER — DEXTROSE 50 % IV SOLN
INTRAVENOUS | Status: AC
  Administered 2012-02-24: 50 mL
  Filled 2012-02-24: qty 50

## 2012-02-24 MED ORDER — ONDANSETRON HCL 4 MG/2ML IJ SOLN
INTRAMUSCULAR | Status: AC
  Administered 2012-02-24: 01:00:00
  Filled 2012-02-24: qty 2

## 2012-02-24 MED ORDER — PENTAFLUOROPROP-TETRAFLUOROETH EX AERO: 1.0000 "application " | INHALATION_SPRAY | CUTANEOUS | Status: DC | PRN

## 2012-02-24 MED ORDER — SODIUM CHLORIDE 0.9 % IV SOLN: 100.0000 mL | INTRAVENOUS | Status: DC | PRN

## 2012-02-24 MED ORDER — LIDOCAINE HCL (PF) 1 % IJ SOLN: 5.0000 mL | INTRAMUSCULAR | Status: DC | PRN

## 2012-02-24 MED ORDER — LIDOCAINE-PRILOCAINE 2.5-2.5 % EX CREA: 1.0000 "application " | TOPICAL_CREAM | CUTANEOUS | Status: DC | PRN

## 2012-02-24 MED ORDER — ALTEPLASE 2 MG IJ SOLR
2.0000 mg | Freq: Once | INTRAMUSCULAR | Status: AC | PRN
  Filled 2012-02-24: qty 2

## 2012-02-24 MED ORDER — NEPRO/CARBSTEADY PO LIQD: 237.0000 mL | ORAL | Status: DC | PRN

## 2012-02-24 MED ORDER — HYDROMORPHONE HCL PF 1 MG/ML IJ SOLN
0.5000 mg | INTRAMUSCULAR | Status: DC | PRN
  Administered 2012-02-24 – 2012-02-25 (×4): 0.5 mg via INTRAVENOUS
  Filled 2012-02-24 (×4): qty 1

## 2012-02-24 MED ORDER — IOHEXOL 300 MG/ML  SOLN
80.0000 mL | Freq: Once | INTRAMUSCULAR | Status: AC | PRN
  Administered 2012-02-24: 80 mL via INTRAVENOUS

## 2012-02-24 MED ORDER — RENA-VITE PO TABS
1.0000 | ORAL_TABLET | Freq: Every day | ORAL | Status: DC
  Administered 2012-02-24: 1 via ORAL
  Filled 2012-02-24 (×2): qty 1

## 2012-02-24 MED ORDER — DEXTROSE 50 % IV SOLN: 25.0000 mL | Freq: Once | INTRAVENOUS | Status: AC | PRN

## 2012-02-24 MED ORDER — RANOLAZINE ER 500 MG PO TB12
500.0000 mg | ORAL_TABLET | Freq: Every day | ORAL | Status: DC
  Administered 2012-02-25: 500 mg via ORAL
  Filled 2012-02-24 (×2): qty 1

## 2012-02-24 MED ORDER — HEPARIN SODIUM (PORCINE) 1000 UNIT/ML DIALYSIS
20.0000 [IU]/kg | INTRAMUSCULAR | Status: DC | PRN
  Administered 2012-02-24: 1200 [IU] via INTRAVENOUS_CENTRAL
  Filled 2012-02-24: qty 2

## 2012-02-24 MED ORDER — HEPARIN SODIUM (PORCINE) 1000 UNIT/ML DIALYSIS
1000.0000 [IU] | INTRAMUSCULAR | Status: DC | PRN
  Filled 2012-02-24: qty 1

## 2012-02-24 MED ORDER — SEVELAMER CARBONATE 800 MG PO TABS
800.0000 mg | ORAL_TABLET | Freq: Three times a day (TID) | ORAL | Status: DC
  Administered 2012-02-24 – 2012-02-25 (×4): 800 mg via ORAL
  Filled 2012-02-24 (×7): qty 1

## 2012-02-24 MED ORDER — ACETAMINOPHEN 325 MG PO TABS
650.0000 mg | ORAL_TABLET | Freq: Four times a day (QID) | ORAL | Status: DC | PRN
  Administered 2012-02-24: 650 mg via ORAL
  Filled 2012-02-24: qty 2

## 2012-02-24 NOTE — Progress Notes (Signed)
The patient has chronic abdominal pain, CT is not really that much worse.  Will follow peripherally.  CPM.  Marta Lamas. Gae Bon, MD, FACS [phone] [facility] Surgery

## 2012-02-24 NOTE — Progress Notes (Signed)
Hypoglycemic Event  CBG: Results for [name], [name] (MRN [id number]) as of 02/24/2012 22:08  Ref. Range 02/24/2012 21:58  Glucose-Capillary Latest Range: 70-99 mg/dL 67 (L)    Treatment: W41 IV 25 mL  Symptoms: None  Follow-up CBG: Time:1036 CBG Result:140  Possible Reasons for Event: Inadequate meal intake  Comments/MD notified: NO    Carmin Muskrat  Remember to initiate Hypoglycemia Order Set & complete

## 2012-02-24 NOTE — H&P (Signed)
[facility] History and Physical  [name] ZOX:[id number] DOB: 02-May-1951 DOA: 02/23/2012  Referring physician: EDP PCP: Sheila Oats, MD   Chief Complaint: Abd pain SOB  HPI: [name] is a 61 y.o. female with a very complicated PMH who states that for the past 4 weeks she has been debilitated-specificallySOB and has had some Abd pain She states that when she does walk she gets SOB.  SHe usually could walk with PT and OT, but due to her abd pain which is chronic, was told by PT to hold off of the same-The SOB started 3 weeks ago, mainly with activity-no CP with it.  SHe does state that when she gets some R arm pain.  She even states that moving the wheelchair around makes her SOB.  SHe states she was completely normal about 3 weeks ago-was mobile with a wheelchair and could get into the van-states she has had some selling in her Legs up to knees, which is chronic.  Not worsened by laying flat.  No PND, no increase in pillows.  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 Was at [facility].  She had recurrent rectal bleeding which eventually resulted in a colonoscopy.  SHe was transienly on Hospice at [facility] and changed her mind  She states she has no GI bleeding or dark stool now.   Occasionally vomits if she doesn't eat-she states that this happens maybe daily.   She has pain in her abdomen daily-usually is usually at about 10/10-takes Methadone previously and Hydromorphone while she was on hospice.  Has a dialysis catheter that has been in for about 6 mo  Review of Systems: The patient has no fever no chills, + wght loss.  Her dry weight is 132lb. Has abdominal; pain but no blood in vomit, no cough cold or fever,  NOP stroke like symtpoms, no current CP When she does activity she has R arm pain No blurred or double vision.  No diarrhoea, no rash, no HA's Eats without  her dentures  Past Medical History  Diagnosis Date  . Gallstone pancreatitis   . Coronary artery disease     s/p CABG 2008 with multiple PCIs  . Ischemic cardiomyopathy   . End stage renal disease   . Diabetes mellitus   . Hypertension   . History of nonadherence to medical treatment   . LBBB (left bundle branch block)   . Hyperlipidemia   . Renal insufficiency   . CHF (congestive heart failure)   . Dialysis patient   . Anemia    Chart review   Admit 4/20 for abd pain and encephalopathy-Found to have perforated bowel, ruptured pancreatic pseudocyst and septic with KBC Carbapenamase prod Impienem-changed over to hemodialysis at admission had upper endoscopy as well  Cardiac cath 10/26/2001 equal 1 vessel obstructive coronary artery disease of the LAD 70  Admission 11/04/2001 with necrotizing pancreatitis and cholecystitis and respiratory failure-cholecystectomy was done  Admission 12/15/2001 for abdominal pain  secondary to pancreatic pseudocyst older monitor was done and it showed pauses  Admission 02/07/2002 for abdominal pain once again  Admission 02/20/2002 for early satiety upper GI symptoms and then ultimately had definitive cholecystectomy and drainage of pancreatic pseudocyst  History tubal ligation 1970 vaginal hysterectomy 1975  Admission 07/30/2006 with severe three-vessel coronary disease status post coronary artery bypass grafting by Dr. Zenaida Niece tried-CABG x 4  Admission 11/04/2006 card  mouth multifactorial acute artery postop and diastolic-known coronary artery disease history of CABG 2 LIMA to LAD, saphenous vein graft to diagonal, saphenous vein graft to PDA and saphenous vein graft circumflex  Admission 12/10/2006, had noted severe nonischemic cardiomyopathy LVEF of 80% and admitted with prolonged unstable angina, therefore PCI of proximal midanterior descending artery was done  Admission 27 2004 with volume overload and end-stage renal disease  Admission  01/12/2011 Isch CM 25%-at that time she was thought to probably benefit from pacemaker placement  Admission 06/02/2011 for acute volume overload with repeat echo EF 5-10% [facility] heart failure clinic  Admitted to [facility] 3/5-3/19 2013-then admitted 09/13/2011 with hematemesis bright red blood per rectum-it was felt that patient would benefit from cyst gastrostomy at a tertiary care center  Past Surgical History  Procedure Date  . Coronary artery bypass graft 2008  . Cholecystectomy   . Abdominal hysterectomy   . Tubal ligation   . Tonsillectomy   . Insert / replace / remove pacemaker     pacemaker ICD  . Esophagogastroduodenoscopy 09/15/2011    Procedure: ESOPHAGOGASTRODUODENOSCOPY (EGD);  Surgeon: Theda Belfast, MD;  Location: [facility] ENDOSCOPY;  Service: Endoscopy;  Laterality: N/A;   Social History:  reports that she quit smoking about 11 years ago. She does not have any smokeless tobacco history on file. She reports that she does not drink alcohol or use illicit drugs.  History   Social History  . Marital Status: Single    Spouse Name: N/A    Number of Children: N/A  . Years of Education: N/A   Occupational History  . Not on file.   Social History Main Topics  . Smoking status: Former Smoker    Quit date: 06/15/2000  . Smokeless tobacco: Not on file  . Alcohol Use: No  . Drug Use: No  . Sexually Active: Not on file   Other Topics Concern  . Not on file   Social History Narrative   The patient lives alone in [place].  She is a retired nurse-Psychiatric and ortho.  She is disabled and receives most of her care from the [place], [place].  Son-[name]-[phone] alone     Allergies  Allergen Reactions  . Morphine And Related Anaphylaxis  . Codeine Itching  . Lisinopril     Language changed    Family History  Problem Relation Age of Onset  . Coronary artery disease Mother   . Hypertension Mother   . Diabetes Mother   . Diabetes Sister   . Anesthesia  problems Neg Hx    Prior to Admission medications   Medication Sig Start Date End Date Taking? Authorizing Provider  multivitamin (RENA-VIT) TABS tablet Take 1 tablet by mouth at bedtime. 10/14/11  Yes Cl[place]ford Cyndie Mull, MD  pravastatin (PRAVACHOL) 20 MG tablet Take 20 mg by mouth daily.     Yes Historical Provider, MD  ranolazine (RANEXA) 500 MG 12 hr tablet Take 500 mg by mouth daily.     Yes Historical Provider, MD  sevelamer (RENAGEL) 800 MG tablet Take 800 mg by mouth 3 (three) times daily with meals.    Yes Historical Provider, MD   Physical Exam: Filed Vitals:   02/23/12 1453 02/23/12 1803 02/23/12 2110  BP: 154/86 137/74 134/79  Pulse: 86 90 77  Temp: 98.6 F (37 C) 99 F (37.2 C)   TempSrc: Oral Oral   Resp: 16 14 18   SpO2: 97% 98% 100%     General:  Alert pleasant African  American female looking much older than stated age  Eyes: No icterus no pallor  ENT: Edentulous-she states that her dentures do not fit  Neck: JVD 10 cm  Cardiovascular: S1-S2 regular rate rhythm  Respiratory: Clinically clear with some decreased lung sounds bilaterally  Abdomen: Epigastrium is nontender however left quadrant seemed a little tender  Skin: Grade 3 lotion 2 pitting  Musculoskeletal: Moves all 4 limbs equally  Psychiatric: Flat affect and speaks slowly but is pleasant  Labs on Admission:  Basic Metabolic Panel:  Lab 02/23/12 7829  NA 132*  K 3.8  CL 93*  CO2 25  GLUCOSE 86  BUN 53*  CREATININE 6.00*  CALCIUM 10.8*  MG --  PHOS --   Liver Function Tests:  Lab 02/23/12 1528  AST 14  ALT 7  ALKPHOS 114  BILITOT 0.4  PROT 7.6  ALBUMIN 2.3*    Lab 02/23/12 1938  LIPASE 10*  AMYLASE --   No results found for this basename: AMMONIA:5 in the last 168 hours CBC:  Lab 02/23/12 1528  WBC 15.7*  NEUTROABS 13.3*  HGB 7.2*  HCT 24.7*  MCV 84.3  PLT 271   Cardiac Enzymes: No results found for this basename: CKTOTAL:5,CKMB:5,CKMBINDEX:5,TROPONINI:5 in the  last 168 hours  BNP (last 3 results)  Basename 06/02/11 2034  PROBNP 31365.0*   CBG: No results found for this basename: GLUCAP:5 in the last 168 hours  Radiological Exams on Admission: Dg Chest 2 View  02/23/2012  *RADIOLOGY REPORT*  Clinical Data: Short of breath.  Congestive heart failure.  On dialysis.  CHEST - 2 VIEW  Comparison: 10/06/2011  Findings: Improved aeration of both lungs is seen.  Mild improvement in bilateral airspace disease is seen consistent with improving pulmonary edema.  Cardiomegaly stable.  A new right jugular dual lumen center venous catheter is seen with tips within the right atrium.  No pneumothorax identified. AICD device remains in appropriate position.  Prior CABG again noted.  IMPRESSION: Improved aeration of both lungs and mild improvement in pulmonary edema since prior study.  Stable cardiomegaly.   Original Report Authenticated By: Danae Orleans, M.D.    Ct Abdomen Pelvis W Contrast  02/24/2012  *RADIOLOGY REPORT*  Clinical Data: Lower extremity cramping.  Shortness of breath. Abdominal pain.  Nausea.  Dialysis patient.  CT ABDOMEN AND PELVIS WITH CONTRAST  Technique:  Multidetector CT imaging of the abdomen and pelvis was performed following the standard protocol during bolus administration of intravenous contrast.  Contrast: 80mL OMNIPAQUE IOHEXOL 300 MG/ML  SOLN  Comparison: 10/10/2011  Findings: Small right pleural effusion with infiltration or edema in the lung bases.  Diffuse cardiac enlargement.  Coronary artery calcifications with postoperative change in the mediastinum.  Surgical absence of the gallbladder.  Heterogeneous parenchymal enhancement throughout the liver may be due to early phase of contrast administration but infiltrative process such as cirrhosis or neoplasm is not excluded.  There appears to be portal venous thrombosis which was better demonstrated previously but is likely still present and may account for abnormal enhancement pattern as  well.  Diffuse upper abdominal fluid collection.  Densities are indeterminate, measuring 5-20 HU. This is probably related to ascites but hemorrhage, peritonitis, or proteinaceous fluid could have this appearance.  There is edema throughout the subcutaneous fat and mesenteric fat. The pancreas is poorly visualized due to surrounding fluid and edema.  The kidneys are atrophic with sub centimeter cysts.  No hydronephrosis.  There appear to be enlarged lymph nodes in the  periaortic regions, stable since the previous study.  Calcification throughout the abdominal aorta and branch vessels.  Flow is demonstrated in the mesenteric artery.  The stomach is decompressed.  There are focal gas collections in the gastrosplenic ligament.  The previous study demonstrated perforation and given the presence of these gas collections, residual perforation is not excluded.  Abscess or infection is not excluded.  No small bowel dilatation.  Contrast material flows through to the colon.  Pelvis:  Pelvic fluid collections are demonstrated with low attenuation suggesting ascites.  The bladder is decompressed. Diverticula in the sigmoid colon without diverticulitis.  The appendix is not identified.  IMPRESSION: Small right pleural effusion with infiltration or edema in the lung bases.  Diffuse abdominal fluid collection in measurements. Changes are likely due to ascites but complex fluid collection, hemorrhage, or infection are not excluded.  Small gas bubbles in the gastrosplenic ligament may represent residual perforation or infection.  Abnormal hepatic parenchymal enhancement which could represent infiltrative process or may be related to portal venous thrombosis.   Original Report Authenticated By: Marlon Pel, M.D.     EKG: Independently reviewed. Non performed  Assessment/Plan Active Problems:  Ischemic cardiomyopathy  Chronic systolic congestive heart failure  Hypertension  Shortness of breath  End stage renal  disease  Epigastric abdominal pain  Anemia  Leukocytosis   1. Symptomatic anemia-patient baseline hemoglobin appears to be in the 8-9 range-her hemoglobin 5/ 1/013 was 8.8-this is likely multifactorial but most probably related to her end-stage renal disease-a differential would be gastrointestinal issues although she states that she's had a colonoscopy which was negative. Is a Hemoccult her stools. I will hold off on getting an anemia panel given she is already started being transfused by the emergency room. She will need a CBC in the morning and given her RDW is white at 18.2 consider ordering  folate and B12 as well. She potentially would be a candidate for erythropoietin factors. I will leave this up to nephrology. We will only transfused one unit of packed red blood cells at present time 2. Acute decompensated congestive heart failure, last echocardiogram done [facility]-last echo here at [facility] showed EF 25% with diffuse hypokinesis and akinesis of inferior and apical myocardium. She will need dialysis tomorrow morning given the fact that she has JVD of about 10 cm and peripheral edema-in addition to that she will be transfused blood and which will probably worsen this.  3. Chronic abdominal pain-likely secondary to necrotizing pancreatitis found in 2003 and subsequent ruptured pancreatic pseudocyst on admission earlier this year 10/03/2011. Please try to obtain notes from [facility]-it sounds that she had an EGD and variceal banding but no other acute abdominal surgeries were done. I do not think she will be a good candidate at this stage for any further surgical interventions. Dr. Luisa Hart has seen the patient already in the emergency room given her significant surgical morbidities and it has been noted that patient has diffuse abdominal fluid collection which is likely due to ascites-we will defer to surgery regarding further management but I do not think she would be  a good candidate for any type of intervention at this stage. 4. End-stage renal disease-patient will need dialysis. Nephrology has been contacted-patient will need to continue phosphate binders and other medications program and may need erythropoietin factors 5. History of severe CAD-patient should be on aspirin/Plavix but is not on the same. We will obtain reports from [facility] before making  decisions on these issues-given her anemia I feel it is prudent not to start any antiplatelet agents at present 6. Diabetes mellitus-not on any insulin we'll get CBGs in the morning 7. Hypertension-stage II-hold blood pressure meds given symptomatic anemia which is likely secondary to above 8. Chronic leukocytosis-patient has low-grade temperature of 99 and white count 15-she has had her IV line/access in situ for the past 6 months. Unless she spikes temperature would not treat this at present  Code Status: DO NOT RESUSCITATE Family Communication: Please contact her son in the morning and updated him at number in chart Disposition Plan: Discussion Mr. current patient regarding also care-she is an intelligent lady and understands the snare multiple comorbidities that she has. After obtaining notes from [facility] and clinical resolution of her anemia perhaps a discussion with Palliative medicine is in order  Time spent: 75 minutes  Larue Lightner, [facility] Pager [phone]   If 7PM-7AM, please contact night-coverage [email or web address] Password [email or web address] 02/24/2012, 2:01 AM

## 2012-02-24 NOTE — Progress Notes (Signed)
02/24/2012 patient transfer from 2100 to 6700 at 1145. She is alert, oriented and ambulatory with assist . Patient have bilateral edema. Right hemodialysis cath the area is clean and dry. Patient was placed on telemetry when arrived on unit, she does have a pacemaker. Patient in the crack of her bottom ,she have some excoriation, heels dry and she had barrier cream to the area. Biochemist, clinical.

## 2012-02-24 NOTE — Consult Note (Signed)
[facility]  HISTORY AND PHYSICAL  Zeeva Hollern Mages is an 61 y.o. female.    Chief Complaint: Abd pain and SOB HPI: Pt is 61yo AAF who is known to me from previous hospitalization in April 2013 for a similar presentation.  She has had chronic abd pain and was transitioned from CAPD to IHD in order to improve nutrition as well as relieve Abd pain.  Apparently has had GI procedures performed at an OSH [facility]) and was on hospice but later changed her mind and presented with increasing SOB and Abd pain.  We were asked to see the patient to help manage her ESRD and dialysis needs.  PMH: Past Medical History  Diagnosis Date  . Gallstone pancreatitis   . Coronary artery disease     s/p CABG 2008 with multiple PCIs  . Ischemic cardiomyopathy   . End stage renal disease   . Diabetes mellitus   . Hypertension   . History of nonadherence to medical treatment   . LBBB (left bundle branch block)   . Hyperlipidemia   . Renal insufficiency   . CHF (congestive heart failure)   . Dialysis patient   . Anemia    PSH: Past Surgical History  Procedure Date  . Coronary artery bypass graft 2008  . Cholecystectomy   . Abdominal hysterectomy   . Tubal ligation   . Tonsillectomy   . Insert / replace / remove pacemaker     pacemaker ICD  . Esophagogastroduodenoscopy 09/15/2011    Procedure: ESOPHAGOGASTRODUODENOSCOPY (EGD);  Surgeon: Theda Belfast, MD;  Location: [facility] ENDOSCOPY;  Service: Endoscopy;  Laterality: N/A;    DIALYSIS: [facility]yzes [facility]  Past Medical History  Diagnosis Date  . Gallstone pancreatitis   . Coronary artery disease     s/p CABG 2008 with multiple PCIs  . Ischemic cardiomyopathy   . End stage renal disease   . Diabetes mellitus   . Hypertension   . History of nonadherence to medical treatment   . LBBB (left bundle branch block)   . Hyperlipidemia   . Renal insufficiency   . CHF (congestive heart failure)   . Dialysis patient   . Anemia      Medications:  I have reviewed the patient's current medications.  Medications Prior to Admission  Medication Sig Dispense Refill  . multivitamin (RENA-VIT) TABS tablet Take 1 tablet by mouth at bedtime.  30 tablet  0  . pravastatin (PRAVACHOL) 20 MG tablet Take 20 mg by mouth daily.        . ranolazine (RANEXA) 500 MG 12 hr tablet Take 500 mg by mouth daily.        . sevelamer (RENAGEL) 800 MG tablet Take 800 mg by mouth 3 (three) times daily with meals.         ALLERGIES:   Allergies  Allergen Reactions  . Morphine And Related Anaphylaxis  . Codeine Itching  . Lisinopril     Language changed    FAM HX: Family History  Problem Relation Age of Onset  . Coronary artery disease Mother   . Hypertension Mother   . Diabetes Mother   . Diabetes Sister   . Anesthesia problems Neg Hx     Social History:   reports that she quit smoking about 11 years ago. She does not have any smokeless tobacco history on file. She reports that she does not drink alcohol or use illicit drugs.  ROS: Pertinent items are noted in HPI.  PE:  General appearance: alert, cooperative and no distress Head: Normocephalic, without obvious abnormality, atraumatic Eyes: negative findings: lids and lashes normal, conjunctivae and sclerae normal, corneas clear and pupils equal, round, reactive to light and accomodation Resp: diminished breath sounds bibasilar Cardio: no rub GI: normal findings: bowel sounds normal and abnormal findings:  tenderness to deep palpation Extremities: edema 1+ pitting edema BLE Chest:  Has RIJ tunned catheter in place, no erythema or drainage BMP:  Lab 02/24/12 0625 02/23/12 1528  NA 132* 132*  K 3.8 3.8  CL 94* 93*  CO2 25 25  GLUCOSE 48* 86  BUN 58* 53*  CREATININE 6.30* 6.00*  ALB -- --  CALCIUM 10.3 10.8*  PHOS -- --   Liver Function Tests:  Lab 02/24/12 0625 02/23/12 1528  AST 14 14  ALT 7 7  ALKPHOS 100 114  BILITOT 0.6 0.4  PROT 6.6 7.6  ALBUMIN 2.0*  2.3*    Lab 02/23/12 1938  LIPASE 10*  AMYLASE --   No results found for this basename: AMMONIA:3 in the last 168 hours CBC:  Lab 02/24/12 0625 02/23/12 1528  WBC 11.3* 15.7*  NEUTROABS -- 13.3*  HGB 7.4* 7.2*  HCT 24.4* 24.7*  MCV 84.1 84.3  PLT 214 271   PT/INR: @labrcntip (inr:5) Cardiac Enzymes: No results found for this basename: CKTOTAL:5,CKMB:5,CKMBINDEX:5,TROPONINI:5 in the last 168 hours CBG:  Lab 02/24/12 1313 02/24/12 1224  GLUCAP 172* 44*    Iron Studies: No results found for this basename: IRON:30,TIBC:30,TRANSFERRIN:30,FERRITIN:30 in the last 168 hours  Results for orders placed during the hospital encounter of 02/23/12 (from the past 48 hour(s))  CBC WITH DIFFERENTIAL     Status: Abnormal   Collection Time   02/23/12  3:28 PM      Component Value Range Comment   WBC 15.7 (*) 4.0 - 10.5 K/uL    RBC 2.93 (*) 3.87 - 5.11 MIL/uL    Hemoglobin 7.2 (*) 12.0 - 15.0 g/dL    HCT 16.1 (*) 09.6 - 46.0 %    MCV 84.3  78.0 - 100.0 fL    MCH 24.6 (*) 26.0 - 34.0 pg    MCHC 29.1 (*) 30.0 - 36.0 g/dL    RDW 04.5 (*) 40.9 - 15.5 %    Platelets 271  150 - 400 K/uL    Neutrophils Relative 85 (*) 43 - 77 %    Neutro Abs 13.3 (*) 1.7 - 7.7 K/uL    Lymphocytes Relative 9 (*) 12 - 46 %    Lymphs Abs 1.4  0.7 - 4.0 K/uL    Monocytes Relative 6  3 - 12 %    Monocytes Absolute 1.0  0.1 - 1.0 K/uL    Eosinophils Relative 0  0 - 5 %    Eosinophils Absolute 0.1  0.0 - 0.7 K/uL    Basophils Relative 0  0 - 1 %    Basophils Absolute 0.0  0.0 - 0.1 K/uL   COMPREHENSIVE METABOLIC PANEL     Status: Abnormal   Collection Time   02/23/12  3:28 PM      Component Value Range Comment   Sodium 132 (*) 135 - 145 mEq/L    Potassium 3.8  3.5 - 5.1 mEq/L    Chloride 93 (*) 96 - 112 mEq/L    CO2 25  19 - 32 mEq/L    Glucose, Bld 86  70 - 99 mg/dL    BUN 53 (*) 6 - 23 mg/dL    Creatinine, Ser  6.00 (*) 0.50 - 1.10 mg/dL    Calcium 11.9 (*) 8.4 - 10.5 mg/dL    Total Protein 7.6  6.0 -  8.3 g/dL    Albumin 2.3 (*) 3.5 - 5.2 g/dL    AST 14  0 - 37 U/L    ALT 7  0 - 35 U/L    Alkaline Phosphatase 114  39 - 117 U/L    Total Bilirubin 0.4  0.3 - 1.2 mg/dL    GFR calc non Af Amer 7 (*) >90 mL/min    GFR calc Af Amer 8 (*) >90 mL/min   PREPARE RBC (CROSSMATCH)     Status: Normal   Collection Time   02/23/12  7:00 PM      Component Value Range Comment   Order Confirmation NO CURRENT SAMPLE, MUST ORDER TYPE AND SCREEN     LIPASE, BLOOD     Status: Abnormal   Collection Time   02/23/12  7:38 PM      Component Value Range Comment   Lipase 10 (*) 11 - 59 U/L   TYPE AND SCREEN     Status: Normal (Preliminary result)   Collection Time   02/23/12  7:40 PM      Component Value Range Comment   ABO/RH(D) O POS      Antibody Screen NEG      Sample Expiration 02/26/2012      Unit Number [id number]      Blood Component Type RED CELLS,LR      Unit division 00      Status of Unit ISSUED      Donor AG Type NEGATIVE FOR KELL ANTIGEN      Transfusion Status OK TO TRANSFUSE      Crossmatch Result COMPATIBLE      Unit Number [id number]      Blood Component Type RED CELLS,LR      Unit division 00      Status of Unit ALLOCATED      Donor AG Type NEGATIVE FOR KELL ANTIGEN      Transfusion Status OK TO TRANSFUSE      Crossmatch Result COMPATIBLE     MRSA PCR SCREENING     Status: Normal   Collection Time   02/24/12  4:59 AM      Component Value Range Comment   MRSA by PCR NEGATIVE  NEGATIVE   CBC     Status: Abnormal   Collection Time   02/24/12  6:25 AM      Component Value Range Comment   WBC 11.3 (*) 4.0 - 10.5 K/uL    RBC 2.90 (*) 3.87 - 5.11 MIL/uL    Hemoglobin 7.4 (*) 12.0 - 15.0 g/dL    HCT 41.3 (*) 24.4 - 46.0 %    MCV 84.1  78.0 - 100.0 fL    MCH 25.5 (*) 26.0 - 34.0 pg    MCHC 30.3  30.0 - 36.0 g/dL    RDW 01.0 (*) 27.2 - 15.5 %    Platelets 214  150 - 400 K/uL   COMPREHENSIVE METABOLIC PANEL     Status: Abnormal   Collection Time   02/24/12  6:25 AM       Component Value Range Comment   Sodium 132 (*) 135 - 145 mEq/L    Potassium 3.8  3.5 - 5.1 mEq/L    Chloride 94 (*) 96 - 112 mEq/L    CO2 25  19 - 32 mEq/L    Glucose, Bld  48 (*) 70 - 99 mg/dL    BUN 58 (*) 6 - 23 mg/dL    Creatinine, Ser 1.61 (*) 0.50 - 1.10 mg/dL    Calcium 09.6  8.4 - 10.5 mg/dL    Total Protein 6.6  6.0 - 8.3 g/dL    Albumin 2.0 (*) 3.5 - 5.2 g/dL    AST 14  0 - 37 U/L    ALT 7  0 - 35 U/L    Alkaline Phosphatase 100  39 - 117 U/L    Total Bilirubin 0.6  0.3 - 1.2 mg/dL    GFR calc non Af Amer 6 (*) >90 mL/min    GFR calc Af Amer 7 (*) >90 mL/min   GLUCOSE, CAPILLARY     Status: Abnormal   Collection Time   02/24/12 12:24 PM      Component Value Range Comment   Glucose-Capillary 44 (*) 70 - 99 mg/dL   GLUCOSE, CAPILLARY     Status: Abnormal   Collection Time   02/24/12  1:13 PM      Component Value Range Comment   Glucose-Capillary 172 (*) 70 - 99 mg/dL     Dg Chest 2 View  0/45/4098  *RADIOLOGY REPORT*  Clinical Data: Short of breath.  Congestive heart failure.  On dialysis.  CHEST - 2 VIEW  Comparison: 10/06/2011  Findings: Improved aeration of both lungs is seen.  Mild improvement in bilateral airspace disease is seen consistent with improving pulmonary edema.  Cardiomegaly stable.  A new right jugular dual lumen center venous catheter is seen with tips within the right atrium.  No pneumothorax identified. AICD device remains in appropriate position.  Prior CABG again noted.  IMPRESSION: Improved aeration of both lungs and mild improvement in pulmonary edema since prior study.  Stable cardiomegaly.   Original Report Authenticated By: Danae Orleans, M.D.    Ct Abdomen Pelvis W Contrast  02/24/2012  *RADIOLOGY REPORT*  Clinical Data: Lower extremity cramping.  Shortness of breath. Abdominal pain.  Nausea.  Dialysis patient.  CT ABDOMEN AND PELVIS WITH CONTRAST  Technique:  Multidetector CT imaging of the abdomen and pelvis was performed following the standard  protocol during bolus administration of intravenous contrast.  Contrast: 80mL OMNIPAQUE IOHEXOL 300 MG/ML  SOLN  Comparison: 10/10/2011  Findings: Small right pleural effusion with infiltration or edema in the lung bases.  Diffuse cardiac enlargement.  Coronary artery calcifications with postoperative change in the mediastinum.  Surgical absence of the gallbladder.  Heterogeneous parenchymal enhancement throughout the liver may be due to early phase of contrast administration but infiltrative process such as cirrhosis or neoplasm is not excluded.  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  Diffuse upper abdominal fluid collection.  Densities are indeterminate, measuring 5-20 HU. This is probably related to ascites but hemorrhage, peritonitis, or proteinaceous fluid could have this appearance.  There is edema throughout the subcutaneous fat and mesenteric fat. The pancreas is poorly visualized due to surrounding fluid and edema.  The kidneys are atrophic with sub centimeter cysts.  No hydronephrosis.  There appear to be enlarged lymph nodes in the periaortic regions, stable since the previous study.  Calcification throughout the abdominal aorta and branch vessels.  Flow is demonstrated in the mesenteric artery.  The stomach is decompressed.  There are focal gas collections in the gastrosplenic ligament.  The previous study demonstrated perforation and  given the presence of these gas collections, residual perforation is not excluded.  Abscess or infection is not excluded.  No small bowel dilatation.  Contrast material flows through to the colon.  Pelvis:  Pelvic fluid collections are demonstrated with low attenuation suggesting ascites.  The bladder is decompressed. Diverticula in the sigmoid colon without diverticulitis.  The appendix is not identified.  IMPRESSION: Small right pleural  effusion with infiltration or edema in the lung bases.  Diffuse abdominal fluid collection in measurements. Changes are likely due to ascites but complex fluid collection, hemorrhage, or infection are not excluded.  Small gas bubbles in the gastrosplenic ligament may represent residual perforation or infection.  Abnormal hepatic parenchymal enhancement which could represent infiltrative process or may be related to portal venous thrombosis.   Original Report Authenticated By: Marlon Pel, M.D.     Assessment/Plan 1. Abdominal pain - chronic issue.  pancreatic pseudocyst; pain control per primary svc.  Surgery following peripherally.  2. ESRD - was transitioned to IHD in April when last seen.  Cont with HD qMWF  3. Hypertension/volume - BP soft; evidence of volume excess; needs dialysis with UF today 4. Anemia - Hgb mildly lower that last seen in April.  Will cont with ESA (pt doesn't know outpt dose) and check iron stores.   5. Metabolic bone disease -follow Ca/Phos/iPTH  6. Nutrition -poor - on CL; add CL resource supplement for protein and kcal.  7. Gastric Varices - follow 8. Vascular access- RIJ PC- no purulent drainage.  Neesa Knapik A 02/24/2012, 1:44 PM

## 2012-02-24 NOTE — ED Notes (Signed)
1st unit of blood completed without s/s of adverse reaction.

## 2012-02-24 NOTE — Progress Notes (Addendum)
Hypoglycemic Event  CBG: Results for [name], [name] (MRN [id number]) as of 02/24/2012 22:08  Ref. Range 02/24/2012 20:54  Glucose-Capillary Latest Range: 70-99 mg/dL 63 (L)    Treatment: 15 GM carbohydrate snack  Symptoms: None  Follow-up CBG: [id number] Result:67  Possible Reasons for Event: Vomiting and Inadequate meal intake  Comments/MD notified: No.  Patient is a slow eater. CBG check delayed.    [place] Shi Arzadon  Remember to initiate Hypoglycemia Order Set & complete

## 2012-02-24 NOTE — Progress Notes (Addendum)
Hypoglycemic Event  CBG: 44  Treatment: D50 IV 50 mL  Symptoms: None  Follow-up CBG: Time:1313 CBG Result:172  Possible Reasons for Event: Unknown  Comments/MD notified:NO    Manu Rubey R  Remember to initiate Hypoglycemia Order Set & complete

## 2012-02-24 NOTE — Progress Notes (Signed)
Report of past medical history and present hospitalization course called to receiving nurse, Jacques Earthly, RN [phone].  All questions answered. All belongings transferred with patient via wheelchair.  Patient to notify family when transferred.

## 2012-02-24 NOTE — Progress Notes (Signed)
[facility] Progress Note  TEAM 1 - Stepdown/ICU TEAM   [name] YNW:[id number] DOB: 1950-11-10 DOA: 02/23/2012 PCP: [name], MD  Brief narrative: 61 year old dialysis patient with history of recurrent chronic pancreatitis and chronic abdominal pain multifactorial etiology. Presented to the ER with abdominal pain and shortness of breath. Chest x-ray showed no evidence of volume overload but she was hypoxemic with room air saturations of 86% at presentation. Her hemoglobin was slightly low for her at 7.4 therefore the emergency department physician ordered 1 unit of packed blood cells. Because of her abdominal pain and history of complicated pancreatitis which has included a perforated pancreatic pseudocyst the ER physician consulted surgery. No acute surgical needs were identified. Because of concern for possible progression of respiratory failure the patient was admitted to the step down unit for further monitoring and treatment.  Assessment/Plan:  Acute respiratory failure with hypoxia *Chest x-ray unrevealing - the patient was hypoxemic at presentation and likely was in the early stages of volume overload *Continue supportive care *HD per Nephrology  CKD (chronic kidney disease) stage 5, GFR less than 15 ml/min *Nephrology consulted *Patient normally dialyzes Monday Wednesday Friday in [facility]  Epigastric abdominal pain/Pancreatitis, chronic *No evidence of recurrent pancreatitis this admission - sx much improved at time of f/u exam today  Esophageal varices in cirrhosis/ Ascites/ History of Portal vein thrombosis *Currently no signs of active GI bleeding *Unclear source of cirrhosis but suspect given history obtained from the patient may be alcohol related  Ischemic cardiomyopathy/Chronic systolic congestive heart failure, NYHA class 3- EF 20-25% *Primary treatment regarding any acute heart failure exacerbation would be with hemodialysis-see  above  Leukocytosis *Likely reactive although with ascites seen on CT scan if abdominal pain persists may need to have diagnostic paracentesis *White count is trending downward since admission and is not on antibiotics so we'll follow CBC *Afebrile  Hypertension *Blood pressure well controlled and was not on medications prior to admission  Anemia in chronic kidney disease (CKD) *Presented with slightly low hemoglobin from baseline and this may have been primarily dilutional from volume overload *Patient did receive 1 unit of packed red blood cells in the emergency department and today her hemoglobin is 7.4 *As a precaution will check stools for blood & continue to follow CBC  DVT prophylaxis: SCDs ordered but patient refusing to wear Code Status: DO NOT RESUSCITATE Family Communication: Spoke directly with the patient Disposition Plan: Transfer to renal floor  Consultants: General surgery Nephrology  Procedures: None  Antibiotics: None  HPI/Subjective: Today patient states feels much better than prior to admission. Denied missing any dialysis treatments. No chest pain. Endorsing issues with chronic dyspnea on exertion.   Objective: Blood pressure 138/43, pulse 67, temperature 97.7 F (36.5 C), temperature source Oral, resp. rate 16, height 5\' 4"  (1.626 m), weight 62.1 kg (136 lb 14.5 oz), SpO2 98.00%.  Intake/Output Summary (Last 24 hours) at 02/24/12 1045 Last data filed at 02/24/12 0800  Gross per 24 hour  Intake    520 ml  Output      0 ml  Net    520 ml     Exam: F/U exam completed  Data Reviewed: Basic Metabolic Panel:  Lab 02/24/12 6578 02/23/12 1528  NA 132* 132*  K 3.8 3.8  CL 94* 93*  CO2 25 25  GLUCOSE 48* 86  BUN 58* 53*  CREATININE 6.30* 6.00*  CALCIUM 10.3 10.8*  MG -- --  PHOS -- --   Liver Function Tests:  Lab 02/24/12 0625 02/23/12 1528  AST 14 14  ALT 7 7  ALKPHOS 100 114  BILITOT 0.6 0.4  PROT 6.6 7.6  ALBUMIN 2.0* 2.3*     Lab 02/23/12 1938  LIPASE 10*  AMYLASE --   CBC:  Lab 02/24/12 0625 02/23/12 1528  WBC 11.3* 15.7*  NEUTROABS -- 13.3*  HGB 7.4* 7.2*  HCT 24.4* 24.7*  MCV 84.1 84.3  PLT 214 271    Basename 06/02/11 2034  PROBNP 31365.0*    Recent Results (from the past 240 hour(s))  MRSA PCR SCREENING     Status: Normal   Collection Time   02/24/12  4:59 AM      Component Value Range Status Comment   MRSA by PCR NEGATIVE  NEGATIVE Final      Studies:  Recent x-ray studies have been reviewed in detail by the Attending Physician  Scheduled Meds:  Reviewed in detail by the Attending Physician   Junious Silk, ANP [facility] Office  [phone] Pager [phone]  On-Call/Text Page:      [email or web address]      password [email or web address]  If 7PM-7AM, please contact night-coverage [email or web address] Password [email or web address] 02/24/2012, 10:45 AM   LOS: 1 day   I have personally examined this patient and reviewed the entire database. I have reviewed the above note, made any necessary editorial changes, and agree with its content.  Lonia Blood, MD [facility]

## 2012-02-24 NOTE — Consult Note (Signed)
Reason for Consult:abdominal pain Referring Physician: [name] is an 61 y.o. female.  HPI: Asked to see patient for abdominal pain.  Has history of chronic pancreatitis and epigastric pain.  Last seen here in April 2013 and worked up with CT.  Has the appearance at that time of gastric perforation but this was not the case.  History of portal venous thrombosis and air along the gastrosplenic ligament.  No distress currently.  Pain in epigastrium is similar to her chronic pain but more intense tonight.  Sharp radiated to her back.  History of pseudocyst.  No fever or chills.  Gets her care at [facility].  Past Medical History  Diagnosis Date  . Gallstone pancreatitis   . Coronary artery disease     s/p CABG 2008 with multiple PCIs  . Ischemic cardiomyopathy   . End stage renal disease   . Diabetes mellitus   . Hypertension   . History of nonadherence to medical treatment   . LBBB (left bundle branch block)   . Hyperlipidemia   . Renal insufficiency   . CHF (congestive heart failure)   . Dialysis patient   . Anemia     Past Surgical History  Procedure Date  . Coronary artery bypass graft 2008  . Cholecystectomy   . Abdominal hysterectomy   . Tubal ligation   . Tonsillectomy   . Insert / replace / remove pacemaker     pacemaker ICD  . Esophagogastroduodenoscopy 09/15/2011    Procedure: ESOPHAGOGASTRODUODENOSCOPY (EGD);  Surgeon: Theda Belfast, MD;  Location: [facility] ENDOSCOPY;  Service: Endoscopy;  Laterality: N/A;    Family History  Problem Relation Age of Onset  . Coronary artery disease Mother   . Hypertension Mother   . Diabetes Mother   . Diabetes Sister   . Anesthesia problems Neg Hx     Social History:  reports that she quit smoking about 11 years ago. She does not have any smokeless tobacco history on file. She reports that she does not drink alcohol or use illicit drugs.  Allergies:  Allergies  Allergen Reactions  . Morphine And Related Anaphylaxis    . Codeine Itching  . Lisinopril     Language changed    Medications: I have reviewed the patient's current medications.  Results for orders placed during the hospital encounter of 02/23/12 (from the past 48 hour(s))  CBC WITH DIFFERENTIAL     Status: Abnormal   Collection Time   02/23/12  3:28 PM      Component Value Range Comment   WBC 15.7 (*) 4.0 - 10.5 K/uL    RBC 2.93 (*) 3.87 - 5.11 MIL/uL    Hemoglobin 7.2 (*) 12.0 - 15.0 g/dL    HCT 16.1 (*) 09.6 - 46.0 %    MCV 84.3  78.0 - 100.0 fL    MCH 24.6 (*) 26.0 - 34.0 pg    MCHC 29.1 (*) 30.0 - 36.0 g/dL    RDW 04.5 (*) 40.9 - 15.5 %    Platelets 271  150 - 400 K/uL    Neutrophils Relative 85 (*) 43 - 77 %    Neutro Abs 13.3 (*) 1.7 - 7.7 K/uL    Lymphocytes Relative 9 (*) 12 - 46 %    Lymphs Abs 1.4  0.7 - 4.0 K/uL    Monocytes Relative 6  3 - 12 %    Monocytes Absolute 1.0  0.1 - 1.0 K/uL    Eosinophils Relative 0  0 - 5 %    Eosinophils Absolute 0.1  0.0 - 0.7 K/uL    Basophils Relative 0  0 - 1 %    Basophils Absolute 0.0  0.0 - 0.1 K/uL   COMPREHENSIVE METABOLIC PANEL     Status: Abnormal   Collection Time   02/23/12  3:28 PM      Component Value Range Comment   Sodium 132 (*) 135 - 145 mEq/L    Potassium 3.8  3.5 - 5.1 mEq/L    Chloride 93 (*) 96 - 112 mEq/L    CO2 25  19 - 32 mEq/L    Glucose, Bld 86  70 - 99 mg/dL    BUN 53 (*) 6 - 23 mg/dL    Creatinine, Ser 0.98 (*) 0.50 - 1.10 mg/dL    Calcium 11.9 (*) 8.4 - 10.5 mg/dL    Total Protein 7.6  6.0 - 8.3 g/dL    Albumin 2.3 (*) 3.5 - 5.2 g/dL    AST 14  0 - 37 U/L    ALT 7  0 - 35 U/L    Alkaline Phosphatase 114  39 - 117 U/L    Total Bilirubin 0.4  0.3 - 1.2 mg/dL    GFR calc non Af Amer 7 (*) >90 mL/min    GFR calc Af Amer 8 (*) >90 mL/min   PREPARE RBC (CROSSMATCH)     Status: Normal   Collection Time   02/23/12  7:00 PM      Component Value Range Comment   Order Confirmation NO CURRENT SAMPLE, MUST ORDER TYPE AND SCREEN     LIPASE, BLOOD     Status:  Abnormal   Collection Time   02/23/12  7:38 PM      Component Value Range Comment   Lipase 10 (*) 11 - 59 U/L   TYPE AND SCREEN     Status: Normal (Preliminary result)   Collection Time   02/23/12  7:40 PM      Component Value Range Comment   ABO/RH(D) O POS      Antibody Screen NEG      Sample Expiration 02/26/2012      Unit Number [id number]      Blood Component Type RED CELLS,LR      Unit division 00      Status of Unit ISSUED      Donor AG Type NEGATIVE FOR KELL ANTIGEN      Transfusion Status OK TO TRANSFUSE      Crossmatch Result COMPATIBLE      Unit Number [id number]      Blood Component Type RED CELLS,LR      Unit division 00      Status of Unit ALLOCATED      Donor AG Type NEGATIVE FOR KELL ANTIGEN      Transfusion Status OK TO TRANSFUSE      Crossmatch Result COMPATIBLE       Dg Chest 2 View  02/23/2012  *RADIOLOGY REPORT*  Clinical Data: Short of breath.  Congestive heart failure.  On dialysis.  CHEST - 2 VIEW  Comparison: 10/06/2011  Findings: Improved aeration of both lungs is seen.  Mild improvement in bilateral airspace disease is seen consistent with improving pulmonary edema.  Cardiomegaly stable.  A new right jugular dual lumen center venous catheter is seen with tips within the right atrium.  No pneumothorax identified. AICD device remains in appropriate position.  Prior CABG again noted.  IMPRESSION: Improved aeration of  both lungs and mild improvement in pulmonary edema since prior study.  Stable cardiomegaly.   Original Report Authenticated By: Danae Orleans, M.D.    Ct Abdomen Pelvis W Contrast  02/24/2012  *RADIOLOGY REPORT*  Clinical Data: Lower extremity cramping.  Shortness of breath. Abdominal pain.  Nausea.  Dialysis patient.  CT ABDOMEN AND PELVIS WITH CONTRAST  Technique:  Multidetector CT imaging of the abdomen and pelvis was performed following the standard protocol during bolus administration of intravenous contrast.  Contrast: 80mL OMNIPAQUE  IOHEXOL 300 MG/ML  SOLN  Comparison: 10/10/2011  Findings: Small right pleural effusion with infiltration or edema in the lung bases.  Diffuse cardiac enlargement.  Coronary artery calcifications with postoperative change in the mediastinum.  Surgical absence of the gallbladder.  Heterogeneous parenchymal enhancement throughout the liver may be due to early phase of contrast administration but infiltrative process such as cirrhosis or neoplasm is not excluded.  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  Diffuse upper abdominal fluid collection.  Densities are indeterminate, measuring 5-20 HU. This is probably related to ascites but hemorrhage, peritonitis, or proteinaceous fluid could have this appearance.  There is edema throughout the subcutaneous fat and mesenteric fat. The pancreas is poorly visualized due to surrounding fluid and edema.  The kidneys are atrophic with sub centimeter cysts.  No hydronephrosis.  There appear to be enlarged lymph nodes in the periaortic regions, stable since the previous study.  Calcification throughout the abdominal aorta and branch vessels.  Flow is demonstrated in the mesenteric artery.  The stomach is decompressed.  There are focal gas collections in the gastrosplenic ligament.  The previous study demonstrated perforation and given the presence of these gas collections, residual perforation is not excluded.  Abscess or infection is not excluded.  No small bowel dilatation.  Contrast material flows through to the colon.  Pelvis:  Pelvic fluid collections are demonstrated with low attenuation suggesting ascites.  The bladder is decompressed. Diverticula in the sigmoid colon without diverticulitis.  The appendix is not identified.  IMPRESSION: Small right pleural effusion with infiltration or edema in the lung bases.  Diffuse abdominal fluid collection  in measurements. Changes are likely due to ascites but complex fluid collection, hemorrhage, or infection are not excluded.  Small gas bubbles in the gastrosplenic ligament may represent residual perforation or infection.  Abnormal hepatic parenchymal enhancement which could represent infiltrative process or may be related to portal venous thrombosis.   Original Report Authenticated By: Marlon Pel, M.D.     Review of Systems  Constitutional: Positive for malaise/fatigue. Negative for fever and chills.  HENT: Negative.   Eyes: Negative.   Respiratory: Negative.   Cardiovascular: Negative.   Gastrointestinal: Positive for abdominal pain.  Genitourinary: Negative.   Musculoskeletal: Negative.   Skin: Negative.   Neurological: Negative.   Endo/Heme/Allergies: Negative.   Psychiatric/Behavioral: Negative.    Blood pressure 128/57, pulse 63, temperature 97.6 F (36.4 C), temperature source Oral, resp. rate 11, height 5\' 4"  (1.626 m), weight 136 lb 14.5 oz (62.1 kg), SpO2 99.00%. Physical Exam  Constitutional: She appears well-developed. No distress.  HENT:  Head: Normocephalic and atraumatic.  Eyes: EOM are normal. Pupils are equal, round, and reactive to light.  Neck: Normal range of motion. Neck supple.  Cardiovascular: Normal rate and regular rhythm.   Respiratory: Effort normal and breath sounds normal.  GI:  Musculoskeletal: Normal range of motion.  Neurological: She is alert.  Skin: Skin is warm and dry.  Psychiatric: She has a normal mood and affect. Her behavior is normal. Judgment and thought content normal.    Assessment/Plan: Chronic pancreatitis and pain PVT No surgical treatment.  The CT looks better compared to April.  Follow from our standpoint,  Treat pain,  Nutrition supplementation and antibiotics.  [name] A. 02/24/2012, 6:35 AM

## 2012-02-24 NOTE — Progress Notes (Signed)
Subjective: Alert cooperative no distress, say abdomen hurts all the time.  Objective: Vital signs in last 24 hours: Temp:  [97.6 F (36.4 C)-99 F (37.2 C)] 97.6 F (36.4 C) (09/11 0428) Pulse Rate:  [59-90] 59  (09/11 0700) Resp:  [10-21] 12  (09/11 0700) BP: (124-154)/(43-86) 124/43 mmHg (09/11 0700) SpO2:  [97 %-100 %] 100 % (09/11 0700) Weight:  [62.1 kg (136 lb 14.5 oz)] 62.1 kg (136 lb 14.5 oz) (09/11 0500) Last BM Date: 02/23/12  Diet: Cardiac, afebrile, VSS, lipase 10 yesterday, LFT's normal  Intake/Output from previous day: 09/10 0701 - 09/11 0700 In: 400  Out: -  Intake/Output this shift:    General appearance: alert, cooperative and no distress Resp: clear to auscultation bilaterally GI: soft, she says she hurts, no distension, +BS, +BM yesterday.  Lab Results:  11.3 RBC 2.93 2.90 Hemoglobin 7.2 7.4 HCT 24.7 24.4 MCV 84.3 84.1 MCH 24.6 25.5 MCHC 29.1 30.3 RDW 18.2 18.4 Platelets 271 214     Basename 02/23/12 1528  WBC 15.7*  HGB 7.2*  HCT 24.7*  PLT 271    BMET  Basename 02/23/12 1528  NA 132*  K 3.8  CL 93*  CO2 25  GLUCOSE 86  BUN 53*  CREATININE 6.00*  CALCIUM 10.8*   PT/INR No results found for this basename: LABPROT:2,INR:2 in the last 72 hours   Lab 02/23/12 1528  AST 14  ALT 7  ALKPHOS 114  BILITOT 0.4  PROT 7.6  ALBUMIN 2.3*     Lipase     Component Value Date/Time   LIPASE 10* 02/23/2012 1938     Studies/Results: Dg Chest 2 View  02/23/2012  *RADIOLOGY REPORT*  Clinical Data: Short of breath.  Congestive heart failure.  On dialysis.  CHEST - 2 VIEW  Comparison: 10/06/2011  Findings: Improved aeration of both lungs is seen.  Mild improvement in bilateral airspace disease is seen consistent with improving pulmonary edema.  Cardiomegaly stable.  A new right jugular dual lumen center venous catheter is seen with tips within the right atrium.  No pneumothorax identified. AICD device remains in appropriate position.  Prior  CABG again noted.  IMPRESSION: Improved aeration of both lungs and mild improvement in pulmonary edema since prior study.  Stable cardiomegaly.   Original Report Authenticated By: Danae Orleans, M.D.    Ct Abdomen Pelvis W Contrast  02/24/2012  *RADIOLOGY REPORT*  Clinical Data: Lower extremity cramping.  Shortness of breath. Abdominal pain.  Nausea.  Dialysis patient.  CT ABDOMEN AND PELVIS WITH CONTRAST  Technique:  Multidetector CT imaging of the abdomen and pelvis was performed following the standard protocol during bolus administration of intravenous contrast.  Contrast: 80mL OMNIPAQUE IOHEXOL 300 MG/ML  SOLN  Comparison: 10/10/2011  Findings: Small right pleural effusion with infiltration or edema in the lung bases.  Diffuse cardiac enlargement.  Coronary artery calcifications with postoperative change in the mediastinum.  Surgical absence of the gallbladder.  Heterogeneous parenchymal enhancement throughout the liver may be due to early phase of contrast administration but infiltrative process such as cirrhosis or neoplasm is not excluded.  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  Diffuse upper abdominal fluid collection.  Densities are indeterminate, measuring 5-20 HU. This is probably related to ascites but hemorrhage, peritonitis, or proteinaceous fluid could have this appearance.  There is edema throughout the subcutaneous fat and mesenteric fat. The  pancreas is poorly visualized due to surrounding fluid and edema.  The kidneys are atrophic with sub centimeter cysts.  No hydronephrosis.  There appear to be enlarged lymph nodes in the periaortic regions, stable since the previous study.  Calcification throughout the abdominal aorta and branch vessels.  Flow is demonstrated in the mesenteric artery.  The stomach is decompressed.  There are focal gas collections in the  gastrosplenic ligament.  The previous study demonstrated perforation and given the presence of these gas collections, residual perforation is not excluded.  Abscess or infection is not excluded.  No small bowel dilatation.  Contrast material flows through to the colon.  Pelvis:  Pelvic fluid collections are demonstrated with low attenuation suggesting ascites.  The bladder is decompressed. Diverticula in the sigmoid colon without diverticulitis.  The appendix is not identified.  IMPRESSION: Small right pleural effusion with infiltration or edema in the lung bases.  Diffuse abdominal fluid collection in measurements. Changes are likely due to ascites but complex fluid collection, hemorrhage, or infection are not excluded.  Small gas bubbles in the gastrosplenic ligament may represent residual perforation or infection.  Abnormal hepatic parenchymal enhancement which could represent infiltrative process or may be related to portal venous thrombosis.   Original Report Authenticated By: Marlon Pel, M.D.     Medications:    . HYDROmorphone      .  HYDROmorphone (DILAUDID) injection  0.5 mg Intravenous Once  .  HYDROmorphone (DILAUDID) injection  0.5 mg Intravenous Once  .  HYDROmorphone (DILAUDID) injection  0.5 mg Intravenous Once  . iohexol  20 mL Oral Q1 Hr x 2  . multivitamin  1 tablet Oral QHS  . ondansetron      . ondansetron  4 mg Intravenous Once  . ranolazine  500 mg Oral Daily  . sevelamer  800 mg Oral TID WC  . sodium chloride  3 mL Intravenous Q12H    Assessment/Plan Chronic  Pancreatitis/ epigastric pain Hx of pseudocyst, hemorrage and sepsis,cystogastrostomy, referred to tertiary center for advanced pancreaticbiliary treatment, 10/2011 Abdominal varices/GI/rectal bleeding @[facility]  Chronic pain/short term Hospice/on Methadone/hydromorphone Portal venous thrombosis with air.  Review by Dr. Luisa Hart and she does not have gastric perforation ESRD on HD 10/2011 (MWF) CAD/hx of  cardiomyopathy EF 20-25% (10/07/11)/s/p CABG AODM Hypertension Anemia  Plan:  Pt does not have an acute abdomen. Reviewed last PM by Dr. Luisa Hart. No surgical needs at this time.    LOS: 1 day    [name] 02/24/2012

## 2012-02-24 NOTE — Care Management Note (Signed)
    Page 1 of 1   02/24/2012     11:35:59 AM   CARE MANAGEMENT NOTE 02/24/2012  Patient:  TERRINA, DOCTER   Account Number:  [id number]  Date Initiated:  02/24/2012  Documentation initiated by:  [facility]  Subjective/Objective Assessment:   Admitted with anemia, continued weakness and SOB.  Lives alone.     Action/Plan:   Anticipated DC Date:  02/26/2012   Anticipated DC Plan:  HOME W HOME HEALTH SERVICES      DC Planning Services  CM consult      Choice offered to / List presented to:             Status of service:  In process, will continue to follow Medicare Important Message given?   (If response is "NO", the following Medicare IM given date fields will be blank) Date Medicare IM given:   Date Additional Medicare IM given:    Discharge Disposition:    Per UR Regulation:  Reviewed for med. necessity/level of care/duration of stay  If discussed at Long Length of Stay Meetings, dates discussed:    Comments:  02-24-12 11:15am Avie Arenas, RNBSN [phone] Talked with patient.  confirmed that on the last admission here she was sent to the [place] in [place].  Does not feel she will be here very long so would like just to be discharged straight from here to home.  Does have  PT with [facility] health and an aide through the [place] that comes 3/week.  Has a w/c and RW at home. States does get along ok at home.  Suggested a facility that could assist her. Smiled pleasantly and states at this time she is not interested.  [place] notified of admission. [phone].

## 2012-02-25 DIAGNOSIS — N189 Chronic kidney disease, unspecified: Secondary | ICD-10-CM

## 2012-02-25 DIAGNOSIS — N039 Chronic nephritic syndrome with unspecified morphologic changes: Secondary | ICD-10-CM

## 2012-02-25 DIAGNOSIS — R109 Unspecified abdominal pain: Secondary | ICD-10-CM

## 2012-02-25 DIAGNOSIS — J9600 Acute respiratory failure, unspecified whether with hypoxia or hypercapnia: Secondary | ICD-10-CM

## 2012-02-25 LAB — GLUCOSE, CAPILLARY: Glucose-Capillary: 145 mg/dL — ABNORMAL HIGH (ref 70–99)

## 2012-02-25 LAB — RENAL FUNCTION PANEL
Albumin: 1.9 g/dL — ABNORMAL LOW (ref 3.5–5.2)
BUN: 26 mg/dL — ABNORMAL HIGH (ref 6–23)
Calcium: 9.4 mg/dL (ref 8.4–10.5)
Creatinine, Ser: 3.77 mg/dL — ABNORMAL HIGH (ref 0.50–1.10)
Phosphorus: 3.7 mg/dL (ref 2.3–4.6)

## 2012-02-25 LAB — FOLATE RBC: RBC Folate: 2741 ng/mL — ABNORMAL HIGH (ref >=366)

## 2012-02-25 LAB — FERRITIN: Ferritin: 2657 ng/mL — ABNORMAL HIGH (ref 10–291)

## 2012-02-25 LAB — CBC
HCT: 25.3 % — ABNORMAL LOW (ref 36.0–46.0)
MCHC: 28.9 g/dL — ABNORMAL LOW (ref 30.0–36.0)
MCV: 84.6 fL (ref 78.0–100.0)
Platelets: 180 10*3/uL (ref 150–400)
RDW: 18.8 % — ABNORMAL HIGH (ref 11.5–15.5)

## 2012-02-25 LAB — VITAMIN B12: Vitamin B-12: 1562 pg/mL — ABNORMAL HIGH (ref 211–911)

## 2012-02-25 MED ORDER — OXYCODONE HCL 5 MG PO CAPS: 5.0000 mg | ORAL_CAPSULE | ORAL | Status: DC | PRN

## 2012-02-25 MED ORDER — HEPARIN SODIUM (PORCINE) 1000 UNIT/ML DIALYSIS
20.0000 [IU]/kg | INTRAMUSCULAR | Status: DC | PRN
  Filled 2012-02-25: qty 2

## 2012-02-25 NOTE — Progress Notes (Signed)
Patient ID: [name], female   DOB: [birth year], 61 y.o.   MRN: [id number]  [facility] Progress Note    Subjective:   C/o abdominal pain   Objective:   BP 143/84  Pulse 73  Temp 98.1 F (36.7 C) (Oral)  Resp 18  Ht 5\' 4"  (1.626 m)  Wt 59.1 kg (130 lb 4.7 oz)  BMI 22.36 kg/m2  SpO2 94%  Physical Exam: Gen:WD WN AAF in NAD CVS:RRR Resp:CTA JXB:JYNWGN Ext:2+pretib edema  Labs: BMET  Lab 02/25/12 0650 02/24/12 0625 02/23/12 1528  NA 136 132* 132*  K 4.2 3.8 3.8  CL 100 94* 93*  CO2 26 25 25   GLUCOSE 75 48* 86  BUN 26* 58* 53*  CREATININE 3.77* 6.30* 6.00*  ALBUMIN 1.9* 2.0* 2.3*  CALCIUM 9.4 10.3 10.8*  PHOS 3.7 -- --   CBC  Lab 02/25/12 0630 02/24/12 0625 02/23/12 1528  WBC 11.0* 11.3* 15.7*  NEUTROABS -- -- 13.3*  HGB 7.3* 7.4* 7.2*  HCT 25.3* 24.4* 24.7*  MCV 84.6 84.1 84.3  PLT 180 214 271    @IMGRELPRIORS @ Medications:      . darbepoetin (ARANESP) injection - DIALYSIS  100 mcg Intravenous Q Wed-HD  . dextrose      . multivitamin  1 tablet Oral QHS  . ranolazine  500 mg Oral Daily  . sevelamer  800 mg Oral TID WC  . sodium chloride  3 mL Intravenous Q12H     Assessment/ Plan:    1. Abdominal pain - chronic issue. pancreatic pseudocyst; pain control per primary svc. Surgery following peripherally.  2. ESRD - was transitioned to IHD in April when last seen. Cont with HD qMWF  3. Hypertension/volume - BP soft; evidence of volume excess; needs dialysis with UF today 4. Anemia - Hgb mildly lower that last seen in April. Will cont with ESA (pt doesn't know outpt dose) and check iron stores.  5. Metabolic bone disease -follow Ca/Phos/iPTH  6. Protein Malnutrition -poor - on CL; add CL resource supplement for protein and kcal.  7. Gastric Varices - follow 8. Vascular access- RIJ PC- no purulent drainage  [name] A 02/25/2012, 8:37 AM

## 2012-02-25 NOTE — Progress Notes (Signed)
   CARE MANAGEMENT NOTE 02/25/2012  Patient:  [name], [name]   Account Number:  [id number]  Date Initiated:  02/24/2012  Documentation initiated by:  [facility]  Subjective/Objective Assessment:   Admitted with anemia, continued weakness and SOB.  Lives alone.     Action/Plan:   Met with pt who uses [facility]   Anticipated DC Date:  02/26/2012   Anticipated DC Plan:  HOME W HOME HEALTH SERVICES      DC Planning Services  CM consult      Choice offered to / List presented to:          [facility] arranged  HH-1 RN  HH-2 PT      [facility] agency  [facility]   Status of service:  Completed, signed off Medicare Important Message given?   (If response is "NO", the following Medicare IM given date fields will be blank) Date Medicare IM given:   Date Additional Medicare IM given:    Discharge Disposition:  HOME W HOME HEALTH SERVICES  Per UR Regulation:  Reviewed for med. necessity/level of care/duration of stay  If discussed at Long Length of Stay Meetings, dates discussed:    Comments:  02/25/2012 Met with pt who wishes to continue to use [facility] for [facility] and HHPT. [facility] notified and info faxed to that agency to resume services. [facility], [facility] with [facility] @ [facility].  Johny Shock RN MPH [phone]    02-24-12 11:15am Avie Arenas, RNBSN [phone] Talked with patient.  confirmed that on the last admission here she was sent to the [place] in [place].  Does not feel she will be here very long so would like just to be discharged straight from here to home.  Does have  PT with [facility] health and an aide through the [place] that comes 3/week.  Has a w/c and RW at home. States does get along ok at home.  Suggested a facility that could assist her. Smiled pleasantly and states at this time she is not interested.  [place] notified of admission. [phone].

## 2012-02-25 NOTE — Discharge Summary (Signed)
Physician Discharge Summary  Patient ID: [name] MRN: [id number] DOB/AGE: Mar 18, 1951 61 y.o.  Admit date: 02/23/2012 Discharge date: 02/25/2012  Primary Care Physician:  Dorothy Pierini, MD   Discharge Diagnoses:    Principal Problem:  *Acute respiratory failure with hypoxia Active Problems:  Ischemic cardiomyopathy  Chronic systolic congestive heart failure, NYHA class 3- EF 20-25%  Hypertension  CKD (chronic kidney disease) stage 5, GFR less than 15 ml/min  Epigastric abdominal pain  Pancreatitis, chronic  Anemia in chronic kidney disease (CKD)  Leukocytosis  Esophageal varices in cirrhosis  History of Portal vein thrombosis  Ascites      Medication List     As of 02/25/2012 11:38 AM    TAKE these medications         multivitamin Tabs tablet   Take 1 tablet by mouth at bedtime.      oxycodone 5 MG capsule   Commonly known as: OXY-IR   Take 1 capsule (5 mg total) by mouth every 4 (four) hours as needed.      pravastatin 20 MG tablet   Commonly known as: PRAVACHOL   Take 20 mg by mouth daily.      ranolazine 500 MG 12 hr tablet   Commonly known as: RANEXA   Take 500 mg by mouth daily.      RENAGEL 800 MG tablet   Generic drug: sevelamer   Take 800 mg by mouth 3 (three) times daily with meals.         Disposition and Follow-up:  Will be discharged home today in stable and improved condition. Is to followup with her dialysis center tomorrow for scheduled HD and with her PCP in 2 weeks.  Consults:  Nephrology, Dr. Doreatha [name]   Significant Diagnostic Studies:  Dg Chest 2 View  02/23/2012  *RADIOLOGY REPORT*  Clinical Data: Short of breath.  Congestive heart failure.  On dialysis.  CHEST - 2 VIEW  Comparison: 10/06/2011  Findings: Improved aeration of both lungs is seen.  Mild improvement in bilateral airspace disease is seen consistent with improving pulmonary edema.  Cardiomegaly stable.  A new right jugular dual lumen center venous catheter is seen  with tips within the right atrium.  No pneumothorax identified. AICD device remains in appropriate position.  Prior CABG again noted.  IMPRESSION: Improved aeration of both lungs and mild improvement in pulmonary edema since prior study.  Stable cardiomegaly.   Original Report Authenticated By: Danae Orleans, M.D.    Ct Abdomen Pelvis W Contrast  02/24/2012  *RADIOLOGY REPORT*  Clinical Data: Lower extremity cramping.  Shortness of breath. Abdominal pain.  Nausea.  Dialysis patient.  CT ABDOMEN AND PELVIS WITH CONTRAST  Technique:  Multidetector CT imaging of the abdomen and pelvis was performed following the standard protocol during bolus administration of intravenous contrast.  Contrast: 80mL OMNIPAQUE IOHEXOL 300 MG/ML  SOLN  Comparison: 10/10/2011  Findings: Small right pleural effusion with infiltration or edema in the lung bases.  Diffuse cardiac enlargement.  Coronary artery calcifications with postoperative change in the mediastinum.  Surgical absence of the gallbladder.  Heterogeneous parenchymal enhancement throughout the liver may be due to early phase of contrast administration but infiltrative process such as cirrhosis or neoplasm is not excluded.  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  Diffuse upper abdominal fluid collection.  Densities are indeterminate, measuring 5-20 HU. This is probably related to  ascites but hemorrhage, peritonitis, or proteinaceous fluid could have this appearance.  There is edema throughout the subcutaneous fat and mesenteric fat. The pancreas is poorly visualized due to surrounding fluid and edema.  The kidneys are atrophic with sub centimeter cysts.  No hydronephrosis.  There appear to be enlarged lymph nodes in the periaortic regions, stable since the previous study.  Calcification throughout the abdominal aorta and branch vessels.  Flow is  demonstrated in the mesenteric artery.  The stomach is decompressed.  There are focal gas collections in the gastrosplenic ligament.  The previous study demonstrated perforation and given the presence of these gas collections, residual perforation is not excluded.  Abscess or infection is not excluded.  No small bowel dilatation.  Contrast material flows through to the colon.  Pelvis:  Pelvic fluid collections are demonstrated with low attenuation suggesting ascites.  The bladder is decompressed. Diverticula in the sigmoid colon without diverticulitis.  The appendix is not identified.  IMPRESSION: Small right pleural effusion with infiltration or edema in the lung bases.  Diffuse abdominal fluid collection in measurements. Changes are likely due to ascites but complex fluid collection, hemorrhage, or infection are not excluded.  Small gas bubbles in the gastrosplenic ligament may represent residual perforation or infection.  Abnormal hepatic parenchymal enhancement which could represent infiltrative process or may be related to portal venous thrombosis.   Original Report Authenticated By: Marlon Pel, M.D.     Brief H and P: For complete details please refer to admission H and P, but in brief [name] is a 61 y.o. female with a very complicated PMH who states that for the past 4 weeks she has been debilitated-specificallySOB and has had some Abd pain  She states that when she does walk she gets SOB. SHe usually could walk with PT and OT, but due to her abd pain which is chronic, was told by PT to hold off of the same-The SOB started 3 weeks ago, mainly with activity-no CP with it. We were asked to admit her for further evaluation and management.     Hospital Course:  Principal Problem:  *Acute respiratory failure with hypoxia Active Problems:  Ischemic cardiomyopathy  Chronic systolic congestive heart failure, NYHA class 3- EF 20-25%  Hypertension  CKD (chronic kidney disease) stage 5,  GFR less than 15 ml/min  Epigastric abdominal pain  Pancreatitis, chronic  Anemia in chronic kidney disease (CKD)  Leukocytosis  Esophageal varices in cirrhosis  History of Portal vein thrombosis  Ascites   Acute respiratory failure with hypoxia  *Chest x-ray unrevealing - the patient was hypoxemic at presentation and likely was in the early stages of volume overload  *Was dialyzed as scheduled by nephrology.  CKD (chronic kidney disease) stage 5, GFR less than 15 ml/min  *Patient normally dialyzes Monday Wednesday Friday in [facility]   Epigastric abdominal pain/Pancreatitis, chronic  *No evidence of recurrent pancreatitis this admission. *Has chronic abdominal pain. Used to be on PO dialudid and methadone while in hospice, but these were discontinued after she was D/C'd from hospice. *Will give her some Oxy IR to go home with. She needs to discuss chronic pain management with her PCP.  Esophageal varices in cirrhosis/ Ascites/ History of Portal vein thrombosis  *Currently no signs of active GI bleeding  *Unclear source of cirrhosis but suspect given history obtained from the patient may be alcohol related.   Ischemic cardiomyopathy/Chronic systolic congestive heart failure, NYHA class 3- EF 20-25%  *Primary treatment regarding  any acute heart failure exacerbation would be with hemodialysis-see above   Hypertension  *Blood pressure well controlled and was not on medications prior to admission   Anemia in chronic kidney disease (CKD)  *Presented with slightly low hemoglobin from baseline and this may have been primarily dilutional from volume overload  *Patient did receive 1 unit of packed red blood cells in the emergency department and today her hemoglobin is 7.4, which is stable on day of discharge at 7.3.   Time spent on Discharge: Greater than 30 minutes.  SignedChaya Jan [facility] Pager: [phone] 02/25/2012, 11:38 AM

## 2012-02-25 NOTE — Progress Notes (Signed)
Pt discharged to home. Pt. Is alert and oriented. Pt is hemodynamically stable. AVS reviewed with pt. Capable of re verbalizing medication regimen. Discharge plan appropriate and in place. 

## 2012-02-25 NOTE — ED Provider Notes (Signed)
Medical screening examination/treatment/procedure(s) were conducted as a shared visit with non-physician practitioner(s) and myself.  I personally evaluated the patient during the encounter   Dione Booze, MD 02/25/12 0004

## 2012-02-27 LAB — TYPE AND SCREEN
ABO/RH(D): O POS
Antibody Screen: NEGATIVE
Unit division: 0

## 2012-03-01 LAB — CULTURE, BLOOD (ROUTINE X 2): Culture: NO GROWTH

## 2012-03-02 ENCOUNTER — Inpatient Hospital Stay (HOSPITAL_COMMUNITY)
Admission: EM | Admit: 2012-03-02 | Discharge: 2012-03-05 | DRG: 193 | Disposition: A | Discharge status: Home Health | Payer: Non-veteran care | Attending: Internal Medicine | Admitting: Internal Medicine

## 2012-03-02 ENCOUNTER — Encounter (HOSPITAL_COMMUNITY): Payer: Self-pay | Admitting: Neurology

## 2012-03-02 ENCOUNTER — Emergency Department (HOSPITAL_COMMUNITY): Payer: Non-veteran care

## 2012-03-02 DIAGNOSIS — R0602 Shortness of breath: Secondary | ICD-10-CM | POA: Diagnosis present

## 2012-03-02 DIAGNOSIS — R7881 Bacteremia: Secondary | ICD-10-CM

## 2012-03-02 DIAGNOSIS — I255 Ischemic cardiomyopathy: Secondary | ICD-10-CM | POA: Diagnosis present

## 2012-03-02 DIAGNOSIS — D631 Anemia in chronic kidney disease: Secondary | ICD-10-CM | POA: Diagnosis present

## 2012-03-02 DIAGNOSIS — R188 Other ascites: Secondary | ICD-10-CM | POA: Diagnosis present

## 2012-03-02 DIAGNOSIS — J9600 Acute respiratory failure, unspecified whether with hypoxia or hypercapnia: Secondary | ICD-10-CM | POA: Diagnosis present

## 2012-03-02 DIAGNOSIS — K631 Perforation of intestine (nontraumatic): Secondary | ICD-10-CM

## 2012-03-02 DIAGNOSIS — A4159 Other Gram-negative sepsis: Secondary | ICD-10-CM

## 2012-03-02 DIAGNOSIS — I120 Hypertensive chronic kidney disease with stage 5 chronic kidney disease or end stage renal disease: Secondary | ICD-10-CM | POA: Diagnosis present

## 2012-03-02 DIAGNOSIS — K863 Pseudocyst of pancreas: Secondary | ICD-10-CM

## 2012-03-02 DIAGNOSIS — I81 Portal vein thrombosis: Secondary | ICD-10-CM

## 2012-03-02 DIAGNOSIS — I447 Left bundle-branch block, unspecified: Secondary | ICD-10-CM | POA: Diagnosis present

## 2012-03-02 DIAGNOSIS — I10 Essential (primary) hypertension: Secondary | ICD-10-CM | POA: Diagnosis present

## 2012-03-02 DIAGNOSIS — J9601 Acute respiratory failure with hypoxia: Secondary | ICD-10-CM | POA: Diagnosis present

## 2012-03-02 DIAGNOSIS — I8510 Secondary esophageal varices without bleeding: Secondary | ICD-10-CM

## 2012-03-02 DIAGNOSIS — I2510 Atherosclerotic heart disease of native coronary artery without angina pectoris: Secondary | ICD-10-CM | POA: Diagnosis present

## 2012-03-02 DIAGNOSIS — I509 Heart failure, unspecified: Secondary | ICD-10-CM | POA: Diagnosis present

## 2012-03-02 DIAGNOSIS — I2589 Other forms of chronic ischemic heart disease: Secondary | ICD-10-CM | POA: Diagnosis present

## 2012-03-02 DIAGNOSIS — Z9861 Coronary angioplasty status: Secondary | ICD-10-CM

## 2012-03-02 DIAGNOSIS — K861 Other chronic pancreatitis: Secondary | ICD-10-CM | POA: Diagnosis present

## 2012-03-02 DIAGNOSIS — K7460 Unspecified cirrhosis of liver: Secondary | ICD-10-CM | POA: Diagnosis present

## 2012-03-02 DIAGNOSIS — R5383 Other fatigue: Secondary | ICD-10-CM | POA: Diagnosis present

## 2012-03-02 DIAGNOSIS — R1013 Epigastric pain: Secondary | ICD-10-CM

## 2012-03-02 DIAGNOSIS — D72829 Elevated white blood cell count, unspecified: Secondary | ICD-10-CM | POA: Diagnosis present

## 2012-03-02 DIAGNOSIS — N185 Chronic kidney disease, stage 5: Secondary | ICD-10-CM | POA: Diagnosis present

## 2012-03-02 DIAGNOSIS — N186 End stage renal disease: Secondary | ICD-10-CM | POA: Diagnosis present

## 2012-03-02 DIAGNOSIS — Z992 Dependence on renal dialysis: Secondary | ICD-10-CM

## 2012-03-02 DIAGNOSIS — N189 Chronic kidney disease, unspecified: Secondary | ICD-10-CM

## 2012-03-02 DIAGNOSIS — J189 Pneumonia, unspecified organism: Principal | ICD-10-CM | POA: Diagnosis present

## 2012-03-02 DIAGNOSIS — E785 Hyperlipidemia, unspecified: Secondary | ICD-10-CM | POA: Diagnosis present

## 2012-03-02 DIAGNOSIS — B49 Unspecified mycosis: Secondary | ICD-10-CM

## 2012-03-02 DIAGNOSIS — Z951 Presence of aortocoronary bypass graft: Secondary | ICD-10-CM

## 2012-03-02 DIAGNOSIS — I5022 Chronic systolic (congestive) heart failure: Secondary | ICD-10-CM | POA: Diagnosis present

## 2012-03-02 DIAGNOSIS — D696 Thrombocytopenia, unspecified: Secondary | ICD-10-CM | POA: Diagnosis present

## 2012-03-02 DIAGNOSIS — E119 Type 2 diabetes mellitus without complications: Secondary | ICD-10-CM | POA: Diagnosis present

## 2012-03-02 LAB — COMPREHENSIVE METABOLIC PANEL
ALT: 10 U/L (ref 0–35)
Alkaline Phosphatase: 110 U/L (ref 39–117)
CO2: 22 mEq/L (ref 19–32)
Chloride: 95 mEq/L — ABNORMAL LOW (ref 96–112)
GFR calc Af Amer: 10 mL/min — ABNORMAL LOW (ref >90)
Glucose, Bld: 158 mg/dL — ABNORMAL HIGH (ref 70–99)
Potassium: 4.5 mEq/L (ref 3.5–5.1)
Sodium: 133 mEq/L — ABNORMAL LOW (ref 135–145)
Total Bilirubin: 0.5 mg/dL (ref 0.3–1.2)
Total Protein: 7.7 g/dL (ref 6.0–8.3)

## 2012-03-02 LAB — CBC WITH DIFFERENTIAL/PLATELET
Eosinophils Absolute: 0.1 10*3/uL (ref 0.0–0.7)
Hemoglobin: 8 g/dL — ABNORMAL LOW (ref 12.0–15.0)
Lymphocytes Relative: 7 % — ABNORMAL LOW (ref 12–46)
Lymphs Abs: 1.1 10*3/uL (ref 0.7–4.0)
Neutro Abs: 13.7 10*3/uL — ABNORMAL HIGH (ref 1.7–7.7)
Neutrophils Relative %: 88 % — ABNORMAL HIGH (ref 43–77)
Platelets: 338 10*3/uL (ref 150–400)
RBC: 3.25 MIL/uL — ABNORMAL LOW (ref 3.87–5.11)
WBC: 15.6 10*3/uL — ABNORMAL HIGH (ref 4.0–10.5)

## 2012-03-02 LAB — GLUCOSE, CAPILLARY

## 2012-03-02 LAB — POCT I-STAT TROPONIN I

## 2012-03-02 MED ORDER — SEVELAMER CARBONATE 800 MG PO TABS
800.0000 mg | ORAL_TABLET | Freq: Three times a day (TID) | ORAL | Status: DC
  Administered 2012-03-03 – 2012-03-05 (×2): 800 mg via ORAL
  Filled 2012-03-02 (×11): qty 1

## 2012-03-02 MED ORDER — INSULIN ASPART 100 UNIT/ML ~~LOC~~ SOLN
0.0000 [IU] | Freq: Three times a day (TID) | SUBCUTANEOUS | Status: DC
  Administered 2012-03-03 (×2): 1 [IU] via SUBCUTANEOUS

## 2012-03-02 MED ORDER — LEVOFLOXACIN IN D5W 750 MG/150ML IV SOLN
750.0000 mg | Freq: Once | INTRAVENOUS | Status: AC
  Administered 2012-03-02: 750 mg via INTRAVENOUS
  Filled 2012-03-02: qty 150

## 2012-03-02 MED ORDER — DEXTROSE 5 % IV SOLN: 1.0000 g | Freq: Three times a day (TID) | INTRAVENOUS | Status: DC

## 2012-03-02 MED ORDER — SODIUM CHLORIDE 0.9 % IJ SOLN
3.0000 mL | Freq: Two times a day (BID) | INTRAMUSCULAR | Status: DC
  Administered 2012-03-02: 3 mL via INTRAVENOUS

## 2012-03-02 MED ORDER — SODIUM CHLORIDE 0.9 % IJ SOLN: 3.0000 mL | INTRAMUSCULAR | Status: DC | PRN

## 2012-03-02 MED ORDER — HYDROMORPHONE HCL PF 1 MG/ML IJ SOLN
0.5000 mg | Freq: Once | INTRAMUSCULAR | Status: AC
  Administered 2012-03-02: 0.5 mg via INTRAVENOUS
  Filled 2012-03-02: qty 1

## 2012-03-02 MED ORDER — VANCOMYCIN HCL 500 MG IV SOLR: 500.0000 mg | INTRAVENOUS | Status: DC

## 2012-03-02 MED ORDER — LEVOFLOXACIN IN D5W 750 MG/150ML IV SOLN: 750.0000 mg | INTRAVENOUS | Status: DC

## 2012-03-02 MED ORDER — DEXTROSE 5 % IV SOLN
2.0000 g | Freq: Two times a day (BID) | INTRAVENOUS | Status: DC
  Filled 2012-03-02: qty 2

## 2012-03-02 MED ORDER — SODIUM CHLORIDE 0.9 % IV SOLN: 250.0000 mL | INTRAVENOUS | Status: DC | PRN

## 2012-03-02 MED ORDER — SIMVASTATIN 5 MG PO TABS
5.0000 mg | ORAL_TABLET | Freq: Every day | ORAL | Status: DC
  Administered 2012-03-02 – 2012-03-04 (×3): 5 mg via ORAL
  Filled 2012-03-02 (×4): qty 1

## 2012-03-02 MED ORDER — LEVOFLOXACIN IN D5W 500 MG/100ML IV SOLN: 500.0000 mg | INTRAVENOUS | Status: DC

## 2012-03-02 MED ORDER — SODIUM CHLORIDE 0.9 % IV BOLUS (SEPSIS): 250.0000 mL | INTRAVENOUS | Status: DC

## 2012-03-02 MED ORDER — SODIUM CHLORIDE 0.9 % IV BOLUS (SEPSIS)
250.0000 mL | INTRAVENOUS | Status: AC
  Administered 2012-03-02: 250 mL via INTRAVENOUS

## 2012-03-02 MED ORDER — RENA-VITE PO TABS
1.0000 | ORAL_TABLET | Freq: Every day | ORAL | Status: DC
  Administered 2012-03-02 – 2012-03-04 (×3): 1 via ORAL
  Filled 2012-03-02 (×4): qty 1

## 2012-03-02 MED ORDER — HEPARIN SODIUM (PORCINE) 5000 UNIT/ML IJ SOLN
5000.0000 [IU] | Freq: Three times a day (TID) | INTRAMUSCULAR | Status: DC
  Administered 2012-03-02 – 2012-03-05 (×7): 5000 [IU] via SUBCUTANEOUS
  Filled 2012-03-02 (×11): qty 1

## 2012-03-02 MED ORDER — DEXTROSE 5 % IV SOLN
1.0000 g | INTRAVENOUS | Status: DC
  Administered 2012-03-02 – 2012-03-03 (×2): 1 g via INTRAVENOUS
  Filled 2012-03-02 (×2): qty 1

## 2012-03-02 MED ORDER — HYDROMORPHONE HCL PF 1 MG/ML IJ SOLN
0.5000 mg | Freq: Four times a day (QID) | INTRAMUSCULAR | Status: DC | PRN
  Administered 2012-03-02: 0.5 mg via INTRAVENOUS
  Administered 2012-03-03: 04:00:00 via INTRAVENOUS
  Filled 2012-03-02 (×2): qty 1

## 2012-03-02 MED ORDER — VANCOMYCIN HCL IN DEXTROSE 1-5 GM/200ML-% IV SOLN
1000.0000 mg | Freq: Once | INTRAVENOUS | Status: AC
  Administered 2012-03-02: 1000 mg via INTRAVENOUS
  Filled 2012-03-02: qty 200

## 2012-03-02 MED ORDER — RANOLAZINE ER 500 MG PO TB12
500.0000 mg | ORAL_TABLET | Freq: Every day | ORAL | Status: DC
  Administered 2012-03-03 – 2012-03-05 (×3): 500 mg via ORAL
  Filled 2012-03-02 (×3): qty 1

## 2012-03-02 MED ORDER — OXYCODONE HCL 5 MG PO TABS
5.0000 mg | ORAL_TABLET | ORAL | Status: DC | PRN
  Administered 2012-03-04 (×2): 5 mg via ORAL
  Filled 2012-03-02 (×2): qty 1

## 2012-03-02 MED ORDER — OXYCODONE HCL 5 MG PO CAPS: 5.0000 mg | ORAL_CAPSULE | ORAL | Status: DC | PRN

## 2012-03-02 NOTE — ED Notes (Signed)
Admitting MD at bedside.

## 2012-03-02 NOTE — H&P (Signed)
Patient's PCP: [name], MD Patient's nephrologist: [place].  Chief Complaint: Shortness of breath and abdominal pain  History of Present Illness: [name] is a 61 y.o. African American female with history of chronic abdominal pain, gallstone pancreatitis, coronary artery disease status post CABG, ischemic cardiomyopathy, end-stage renal disease on hemodialysis, diet controlled diabetes, hypertension, hyperlipidemia, anemia,Chronic systolic heart failure with an ejection fraction of 20-25% based on echocardiogram on 10/07/2011, who presents with the above complaints.  Patient presented with similar complaints from 02/23/2012 to 02/25/2012, patient had imaging and workup for chronic epigastric/abdominal pain, Patient was also evaluated by general surgery who determined that the patient had chronic abdominal pain with unchanged findings on CT as a result did not recommend any further interventions at that time. Patient was discharged on some oxycodone for pain. She also had shortness of breath which resolved after discharge.  However about 2 days ago she again developed shortness of breath with persistent abdominal pain as a result she presented again to the emergency department for further evaluation. Imaging an x-ray showed right upper lobe airspace opacity concerning for pneumonia. The hospitalist service was consulted to admit the patient for further care and management. Patient denies any fevers, chills, or cough.Denies any chest pain.  Does complain of chronic abdominal pain which has been unchanged over the last month.  Did vomit yesterday once, reported it was bile in color.  Denies any headaches or vision changes.  Past Medical History  Diagnosis Date  . Gallstone pancreatitis   . Coronary artery disease     s/p CABG 2008 with multiple PCIs  . Ischemic cardiomyopathy   . End stage renal disease   . Diabetes mellitus   . Hypertension   . History of nonadherence to medical  treatment   . LBBB (left bundle branch block)   . Hyperlipidemia   . Renal insufficiency   . CHF (congestive heart failure)   . Dialysis patient   . Anemia    Past Surgical History  Procedure Date  . Coronary artery bypass graft 2008  . Cholecystectomy   . Abdominal hysterectomy   . Tubal ligation   . Tonsillectomy   . Insert / replace / remove pacemaker     pacemaker ICD  . Esophagogastroduodenoscopy 09/15/2011    Procedure: ESOPHAGOGASTRODUODENOSCOPY (EGD);  Surgeon: Theda Belfast, MD;  Location[facility] ENDOSCOPY;  Service: Endoscopy;  Laterality: N/A;   Family History  Problem Relation Age of Onset  . Coronary artery disease Mother   . Hypertension Mother   . Diabetes Mother   . Diabetes Sister   . Anesthesia problems Neg Hx    History   Social History  . Marital Status: Single    Spouse Name: N/A    Number of Children: N/A  . Years of Education: N/A   Occupational History  . Not on file.   Social History Main Topics  . Smoking status: Former Smoker    Quit date: 06/15/2000  . Smokeless tobacco: Not on file  . Alcohol Use: No  . Drug Use: No  . Sexually Active: Not on file   Other Topics Concern  . Not on file   Social History Narrative   The patient lives alone in [place].  She is a retired nurse-Psychiatric and ortho.  She is disabled and receives most of her care from the [place], [place].  Son-shawn-[phone] alone   Allergies: Morphine and related; Codeine; and Lisinopril  Meds: Scheduled Meds:   . ceFEPime (MAXIPIME) IV  2 g Intravenous Q12H  .  HYDROmorphone (DILAUDID) injection  0.5 mg Intravenous Once  .  HYDROmorphone (DILAUDID) injection  0.5 mg Intravenous Once  . levofloxacin (LEVAQUIN) IV  750 mg Intravenous Once  . sodium chloride  250 mL Intravenous STAT  . sodium chloride  250 mL Intravenous STAT  . vancomycin  1,000 mg Intravenous Once   Continuous Infusions:  PRN Meds:.  Review of Systems: All systems reviewed with the  patient and positive as per history of present illness, otherwise all other systems are negative.  Physical Exam: Blood pressure 144/93, pulse 105, temperature 98.5 F (36.9 C), temperature source Oral, resp. rate 22, height 5' 4.5" (1.638 m), weight 58.968 kg (130 lb), SpO2 98.00%. General: Awake, Oriented x3, No acute distress. HEENT: EOMI, Moist mucous membranes Neck: Supple CV: S1 and S2 Lungs: Clear to auscultation bilaterally Abdomen: Soft, Nontender, Nondistended, +bowel sounds. Ext: Good pulses. Trace edema. No clubbing or cyanosis noted. Neuro: Cranial Nerves II-XII grossly intact. Has 5/5 motor strength in upper and lower extremities.  Lab results:  Basename 03/02/12 1500  NA 133*  K 4.5  CL 95*  CO2 22  GLUCOSE 158*  BUN 28*  CREATININE 4.72*  CALCIUM 11.1*  MG --  PHOS --    Basename 03/02/12 1500  AST 30  ALT 10  ALKPHOS 110  BILITOT 0.5  PROT 7.7  ALBUMIN 2.4*    Basename 03/02/12 1500  LIPASE 9*  AMYLASE --    Basename 03/02/12 1500  WBC 15.6*  NEUTROABS 13.7*  HGB 8.0*  HCT 27.9*  MCV 85.8  PLT 338   No results found for this basename: CKTOTAL:3,CKMB:3,CKMBINDEX:3,TROPONINI:3 in the last 72 hours No components found with this basename: POCBNP:3 No results found for this basename: DDIMER in the last 72 hours No results found for this basename: HGBA1C:2 in the last 72 hours No results found for this basename: CHOL:2,HDL:2,LDLCALC:2,TRIG:2,CHOLHDL:2,LDLDIRECT:2 in the last 72 hours No results found for this basename: TSH,T4TOTAL,FREET3,T3FREE,THYROIDAB in the last 72 hours No results found for this basename: VITAMINB12:2,FOLATE:2,FERRITIN:2,TIBC:2,IRON:2,RETICCTPCT:2 in the last 72 hours Imaging results:  Dg Chest 2 View  03/02/2012  *RADIOLOGY REPORT*  Clinical Data: Shortness of breath and abdominal pain  CHEST - 2 VIEW  Comparison: 02/23/2012  Findings: There is a left chest wall AICD with leads in the right atrial appendage, coronary sinus  and right ventricle.  The patient is status post sternotomy and CABG procedure.  There is a right chest wall dialysis catheter with tips in the right atrium.  Stable cardiac enlargement.  No pleural effusion or edema. Interval development of right upper lobe airspace opacity.  IMPRESSION:  1.  Right upper lobe airspace opacity concerning for pneumonia. 2.  Cardiac enlargement.   Original Report Authenticated By: Rosealee Albee, M.D.    Dg Chest 2 View  02/23/2012  *RADIOLOGY REPORT*  Clinical Data: Short of breath.  Congestive heart failure.  On dialysis.  CHEST - 2 VIEW  Comparison: 10/06/2011  Findings: Improved aeration of both lungs is seen.  Mild improvement in bilateral airspace disease is seen consistent with improving pulmonary edema.  Cardiomegaly stable.  A new right jugular dual lumen center venous catheter is seen with tips within the right atrium.  No pneumothorax identified. AICD device remains in appropriate position.  Prior CABG again noted.  IMPRESSION: Improved aeration of both lungs and mild improvement in pulmonary edema since prior study.  Stable cardiomegaly.   Original Report Authenticated By: Danae Orleans, M.D.  Ct Abdomen Pelvis W Contrast  02/24/2012  *RADIOLOGY REPORT*  Clinical Data: Lower extremity cramping.  Shortness of breath. Abdominal pain.  Nausea.  Dialysis patient.  CT ABDOMEN AND PELVIS WITH CONTRAST  Technique:  Multidetector CT imaging of the abdomen and pelvis was performed following the standard protocol during bolus administration of intravenous contrast.  Contrast: 80mL OMNIPAQUE IOHEXOL 300 MG/ML  SOLN  Comparison: 10/10/2011  Findings: Small right pleural effusion with infiltration or edema in the lung bases.  Diffuse cardiac enlargement.  Coronary artery calcifications with postoperative change in the mediastinum.  Surgical absence of the gallbladder.  Heterogeneous parenchymal enhancement throughout the liver may be due to early phase of contrast  administration but infiltrative process such as cirrhosis or neoplasm is not excluded.  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  Diffuse upper abdominal fluid collection.  Densities are indeterminate, measuring 5-20 HU. This is probably related to ascites but hemorrhage, peritonitis, or proteinaceous fluid could have this appearance.  There is edema throughout the subcutaneous fat and mesenteric fat. The pancreas is poorly visualized due to surrounding fluid and edema.  The kidneys are atrophic with sub centimeter cysts.  No hydronephrosis.  There appear to be enlarged lymph nodes in the periaortic regions, stable since the previous study.  Calcification throughout the abdominal aorta and branch vessels.  Flow is demonstrated in the mesenteric artery.  The stomach is decompressed.  There are focal gas collections in the gastrosplenic ligament.  The previous study demonstrated perforation and given the presence of these gas collections, residual perforation is not excluded.  Abscess or infection is not excluded.  No small bowel dilatation.  Contrast material flows through to the colon.  Pelvis:  Pelvic fluid collections are demonstrated with low attenuation suggesting ascites.  The bladder is decompressed. Diverticula in the sigmoid colon without diverticulitis.  The appendix is not identified.  IMPRESSION: Small right pleural effusion with infiltration or edema in the lung bases.  Diffuse abdominal fluid collection in measurements. Changes are likely due to ascites but complex fluid collection, hemorrhage, or infection are not excluded.  Small gas bubbles in the gastrosplenic ligament may represent residual perforation or infection.  Abnormal hepatic parenchymal enhancement which could represent infiltrative process or may be related to portal venous thrombosis.   Original Report  Authenticated By: Marlon Pel, M.D.    Other results: EKG: Sinus tachycardia, left bundle branch block.  Assessment & Plan by Problem: Acute respiratory failure/shortness of breath secondary to healthcare associated pneumonia given recent hospitalization for about 2 days. Chest x-ray 2 view on 03/02/2012 showed right upper lobe airspace opacity concerning for pneumonia.  Start vancomycin, cefepime, and levofloxacin.  Transition to oral levofloxacin monotherapy depending on patient's clinical course. Minimize IV fluids given history of chronic systolic heart failure.  Generalized weakness Will request PT consultation.  Chronic abdominal pain Likely secondary to chronic pancreatitis.  Has had recent evaluation and workup.  Has been evaluated by general surgery in early September of 2013, patient reports pain is unchanged from about a month ago.  Do not see the need for further workup or evaluation at this time. Continue PRN oxycodone.  End stage renal disease Discussed with renal, Dr. Arrie Aran for dialysis. Continue sevelamer. Had dialysis on 03/02/2012.  History of ischemic cardiomyopathy/chronic systolic congestive heart failure with EF 20-25% based on ECHO on 10/07/2011 Patient appears compensated at this time.  Continue to monitor. Continue Ranolazine.  Diabetes type 2 Hemoglobin A1c 5.7 on 10/04/2011. Stable. Sensitive SSI.  Hypertension Stable. Not on any antihypertensive medications.  Hyperlipidemia Continue statin.  Leukocytosis Likely due to pneumonia.  Anemia Likely due to ESRD, s/p 1 unit of pRBC on 02/23/2012. Continue to monitor.  History of fatty liver with Ascities Patient gets paracentesis every 2 weeks (last paracentesis ~3 weeks ago). Currently no obvious paracentesis on exam.  Thrombocytopenia Resolved at this time. Likely due to chronic liver disease.  Prophylaxis SQ heparin.  Code status She indicated that she wishs to be have CPR or chest  compressions done, but she does not wish to be on mechanical ventilation or life support (even temporarily).  Disposition Admit to telemetry.  Time spent on admission, talking to the patient, and coordinating care was: 60 mins.  Benedetto Ryder A, MD 03/02/2012, 6:30 PM

## 2012-03-02 NOTE — Progress Notes (Signed)
ANTIBIOTIC CONSULT NOTE - INITIAL  Pharmacy Consult for Vancomycin, levofloxacin, cefepime Indication: pneumonia  Allergies  Allergen Reactions  . Morphine And Related Anaphylaxis  . Codeine Itching  . Lisinopril Other (See Comments)    unknown    Patient Measurements: Height: 5' 4.5" (163.8 cm) Weight: 130 lb (58.968 kg) IBW/kg (Calculated) : 55.85  Adjusted Body Weight: 59 kg  Vital Signs: Temp: 98 F (36.7 C) (09/18 1840) Temp src: Oral (09/18 1840) BP: 142/94 mmHg (09/18 1840) Pulse Rate: 109  (09/18 1840)    Labs:  Basename 03/02/12 1500  WBC 15.6*  HGB 8.0*  PLT 338  LABCREA --  CREATININE 4.72*    Microbiology: Recent Results (from the past 720 hour(s))  MRSA PCR SCREENING     Status: Normal   Collection Time   02/24/12  4:59 AM      Component Value Range Status Comment   MRSA by PCR NEGATIVE  NEGATIVE Final   CULTURE, BLOOD (ROUTINE X 2)     Status: Normal   Collection Time   02/24/12  6:25 AM      Component Value Range Status Comment   Specimen Description BLOOD LEFT HAND   Final    Special Requests     Final    Value: BOTTLES DRAWN AEROBIC AND ANAEROBIC 10CC BLUE,5CC RED   Culture  Setup Time 02/24/2012 08:30   Final    Culture NO GROWTH 5 DAYS   Final    Report Status 03/01/2012 FINAL   Final   CULTURE, BLOOD (ROUTINE X 2)     Status: Normal   Collection Time   02/24/12  6:35 AM      Component Value Range Status Comment   Specimen Description BLOOD LEFT HAND   Final    Special Requests BOTTLES DRAWN AEROBIC ONLY 10CC   Final    Culture  Setup Time 02/24/2012 08:30   Final    Culture NO GROWTH 5 DAYS   Final    Report Status 03/01/2012 FINAL   Final     Medical History: Past Medical History  Diagnosis Date  . Gallstone pancreatitis   . Coronary artery disease     s/p CABG 2008 with multiple PCIs  . Ischemic cardiomyopathy   . End stage renal disease   . Diabetes mellitus   . Hypertension   . History of nonadherence to medical treatment    . LBBB (left bundle branch block)   . Hyperlipidemia   . Renal insufficiency   . CHF (congestive heart failure)   . Dialysis patient   . Anemia     Medications:  Prescriptions prior to admission  Medication Sig Dispense Refill  . multivitamin (RENA-VIT) TABS tablet Take 1 tablet by mouth daily.      [name] oxycodone (OXY-IR) 5 MG capsule Take 5 mg by mouth every 4 (four) hours as needed.      . pravastatin (PRAVACHOL) 20 MG tablet Take 20 mg by mouth at bedtime.       . ranolazine (RANEXA) 500 MG 12 hr tablet Take 500 mg by mouth daily.        . sevelamer (RENAGEL) 800 MG tablet Take 800 mg by mouth 3 (three) times daily with meals.        Assessment: 61 year old woman with ESRD to start on vancomycin, cefepime, and levofloxacin for HCAP.  She has already received 1g of vancomycin in the ED as well as 750mg  of Levofloxacin.  Goal of Therapy:  Vancomycin pre HD level 15-25mg /L  Plan:  Follow up culture results -Change cefepime to 1g q24. -change levofloxacin to 750mg  x 1 dose, then 500mg  q48h -vancomycin 500mg  after HD on HD days  Mickeal Skinner 03/02/2012,7:10 PM

## 2012-03-02 NOTE — ED Provider Notes (Signed)
I saw and evaluated the patient, reviewed the resident's note and I agree with the findings and plan. Dialysis patient presents with shortness of breath, and abdominal pain.  Symptoms are chronic.  No cough, fevers, chills, or shortness of breath.  No urinary tract symptoms, or diarrhea.  On, examination.  Lungs are clear with unlabored respirations.  Mild abdominal tenderness in the lower quadrants.  No peritoneal signs.  We will give her analgesics, and perform laboratory testing, for evaluation.  Cheri Guppy, MD 03/02/12 [phone]

## 2012-03-02 NOTE — Progress Notes (Signed)
Received pt. From the ED,pt. Alert and oriented,from home alone,uses walker and cane to move around.Pt. Skin is intact,she has a scar on her sacrum area.telemetry box 6709 is on.keep monitoring pt. And assessing her needs.

## 2012-03-02 NOTE — ED Notes (Signed)
Patient transported to X-ray 

## 2012-03-02 NOTE — ED Notes (Addendum)
Per ems- Pt reporting sob since 0200 am, dialysis yesterday. C/o LLQ and RLQ abdominal pain. Denying any cp. Lung sounds clear, 97% RA. No n/v. A x 4. HR 100 SR. 150/96.

## 2012-03-02 NOTE — ED Provider Notes (Signed)
History     CSN: [id number]  Arrival date & time 03/02/12  1433   First MD Initiated Contact with Patient 03/02/12 1503      Chief Complaint  Patient presents with  . Shortness of Breath  . Abdominal Pain    (Consider location/radiation/quality/duration/timing/severity/associated sxs/prior treatment) Patient is a 61 y.o. female presenting with abdominal pain. The history is provided by the patient.  Abdominal Pain The primary symptoms of the illness include abdominal pain, shortness of breath and vomiting. The primary symptoms of the illness do not include fever, fatigue, nausea, diarrhea or dysuria. The current episode started more than 2 days ago. The onset of the illness was gradual. The problem has not changed since onset. The abdominal pain began more than 2 days ago. The pain came on gradually. The abdominal pain has been unchanged since its onset. The abdominal pain is located in the LLQ and RLQ. The abdominal pain does not radiate. The severity of the abdominal pain is 8/10. The abdominal pain is relieved by nothing. Exacerbated by: palpation, supine position.  The patient has not had a change in bowel habit. Risk factors for an acute abdominal problem include being elderly and a history of abdominal surgery. Symptoms associated with the illness do not include hematuria or back pain.    Past Medical History  Diagnosis Date  . Gallstone pancreatitis   . Coronary artery disease     s/p CABG 2008 with multiple PCIs  . Ischemic cardiomyopathy   . End stage renal disease   . Diabetes mellitus   . Hypertension   . History of nonadherence to medical treatment   . LBBB (left bundle branch block)   . Hyperlipidemia   . Renal insufficiency   . CHF (congestive heart failure)   . Dialysis patient   . Anemia     Past Surgical History  Procedure Date  . Coronary artery bypass graft 2008  . Cholecystectomy   . Abdominal hysterectomy   . Tubal ligation   . Tonsillectomy   .  Insert / replace / remove pacemaker     pacemaker ICD  . Esophagogastroduodenoscopy 09/15/2011    Procedure: ESOPHAGOGASTRODUODENOSCOPY (EGD);  Surgeon: Theda Belfast, MD;  Location: [facility] ENDOSCOPY;  Service: Endoscopy;  Laterality: N/A;    Family History  Problem Relation Age of Onset  . Coronary artery disease Mother   . Hypertension Mother   . Diabetes Mother   . Diabetes Sister   . Anesthesia problems Neg Hx     History  Substance Use Topics  . Smoking status: Former Smoker    Quit date: 06/15/2000  . Smokeless tobacco: Not on file  . Alcohol Use: No    OB History    Grav Para Term Preterm Abortions TAB SAB Ect Mult Living                  Review of Systems  Constitutional: Negative for fever and fatigue.  HENT: Negative for congestion, drooling and neck pain.   Eyes: Negative for pain.  Respiratory: Positive for shortness of breath. Negative for cough and chest tightness.   Cardiovascular: Negative for chest pain.  Gastrointestinal: Positive for vomiting and abdominal pain. Negative for nausea and diarrhea.  Genitourinary: Negative for dysuria and hematuria.  Musculoskeletal: Negative for back pain and gait problem.  Skin: Negative for color change.  Neurological: Negative for dizziness and headaches.  Hematological: Negative for adenopathy.  Psychiatric/Behavioral: Negative for behavioral problems.  All other systems reviewed and  are negative.    Allergies  Morphine and related; Codeine; and Lisinopril  Home Medications   Current Outpatient Rx  Name Route Sig Dispense Refill  . RENA-VITE PO TABS Oral Take 1 tablet by mouth at bedtime. 30 tablet 0  . OXYCODONE HCL 5 MG PO CAPS Oral Take 1 capsule (5 mg total) by mouth every 4 (four) hours as needed. 30 capsule 0  . PRAVASTATIN SODIUM 20 MG PO TABS Oral Take 20 mg by mouth daily.      [name] RANOLAZINE ER 500 MG PO TB12 Oral Take 500 mg by mouth daily.      [name] SEVELAMER HCL 800 MG PO TABS Oral Take 800 mg by mouth  3 (three) times daily with meals.       BP 138/86  Pulse 106  Temp 98.5 F (36.9 C) (Oral)  Resp 20  Ht 5' 4.5" (1.638 m)  Wt 130 lb (58.968 kg)  BMI 21.97 kg/m2  SpO2 97%  Physical Exam  Nursing note and vitals reviewed. Constitutional: She is oriented to person, place, and time. She appears well-developed and well-nourished.  HENT:  Head: Normocephalic.  Mouth/Throat: No oropharyngeal exudate.       Mild tacky oral mucous membranes  Eyes: Conjunctivae normal and EOM are normal. Pupils are equal, round, and reactive to light.  Neck: Normal range of motion. Neck supple.  Cardiovascular: Intact distal pulses.  Exam reveals no gallop and no friction rub.   Murmur heard.      Sinus tachycardia 104  Pulmonary/Chest: Effort normal. No respiratory distress. She has no wheezes.       Dec BS in bilateral lung bases, mild crackles in left lung base  Abdominal: Soft. Bowel sounds are normal. There is tenderness (diffuse lower abd mild to mod ttp, abd is soft). There is no rebound and no guarding.  Musculoskeletal: Normal range of motion. She exhibits no edema and no tenderness.  Neurological: She is alert and oriented to person, place, and time.  Skin: Skin is warm and dry.  Psychiatric: She has a normal mood and affect. Her behavior is normal.    ED Course  Procedures (including critical care time)  Labs Reviewed - No data to display No results found.   No diagnosis found.   Date: 03/02/2012  Rate: 106  Rhythm: sinus tachycardia  QRS Axis: left  Intervals: QT prolonged  ST/T Wave abnormalities: normal  Conduction Disutrbances:left bundle branch block  Narrative Interpretation: No excessive discordance or concordance noted, No new ST or T wave changes cw ischemia  Old EKG Reviewed: changes noted    MDM  3:11 PM 61 y.o. female w hx of DM, CAD s/p CABG and PCI, pseudocyst pw continued weakness, sob, and lower abd pain since last admission approx 8 days ago. Pt states she  is no better. She notes the abd pain is unchanged and cw her chronic pain. She denies N/F/D, has had mild emesis yesterday. Pt denies cp. Will perform infectious workup and get pain control. Will not check UA as pt makes little urine. Pt had CT abd 8 days ago, Surgeon reviewed report and saw pt at that time noting little change in imaging from prior.   Pt found to have pna on CXR. Concern for HCAP w/ recent admission. Will start vanc/cefepime. Consulted hospitalist for admission who would like levaquin added as well.   Clinical Impression 1. HCAP (healthcare-associated pneumonia)   2. Fatigue   3. SOB (shortness of breath)   4.  Acute respiratory failure with hypoxia   5. Anemia in chronic kidney disease (CKD)   6. CKD (chronic kidney disease) stage 5, GFR less than 15 ml/min         Purvis Sheffield, MD 03/03/12 [phone]

## 2012-03-03 DIAGNOSIS — I10 Essential (primary) hypertension: Secondary | ICD-10-CM

## 2012-03-03 LAB — CBC
MCH: 25.1 pg — ABNORMAL LOW (ref 26.0–34.0)
Platelets: 258 10*3/uL (ref 150–400)
RBC: 3.23 MIL/uL — ABNORMAL LOW (ref 3.87–5.11)
WBC: 16.4 10*3/uL — ABNORMAL HIGH (ref 4.0–10.5)

## 2012-03-03 LAB — HIV ANTIBODY (ROUTINE TESTING W REFLEX): HIV: NONREACTIVE

## 2012-03-03 LAB — BASIC METABOLIC PANEL
Calcium: 11.2 mg/dL — ABNORMAL HIGH (ref 8.4–10.5)
GFR calc Af Amer: 9 mL/min — ABNORMAL LOW (ref >90)
GFR calc non Af Amer: 8 mL/min — ABNORMAL LOW (ref >90)
Potassium: 4.7 mEq/L (ref 3.5–5.1)
Sodium: 134 mEq/L — ABNORMAL LOW (ref 135–145)

## 2012-03-03 MED ORDER — LIDOCAINE-PRILOCAINE 2.5-2.5 % EX CREA: 1.0000 "application " | TOPICAL_CREAM | CUTANEOUS | Status: DC | PRN

## 2012-03-03 MED ORDER — HYDROMORPHONE HCL PF 1 MG/ML IJ SOLN
0.5000 mg | INTRAMUSCULAR | Status: DC | PRN
  Filled 2012-03-03: qty 1

## 2012-03-03 MED ORDER — LIDOCAINE HCL (PF) 1 % IJ SOLN: 5.0000 mL | INTRAMUSCULAR | Status: DC | PRN

## 2012-03-03 MED ORDER — HEPARIN SODIUM (PORCINE) 1000 UNIT/ML DIALYSIS
1000.0000 [IU] | INTRAMUSCULAR | Status: DC | PRN
  Administered 2012-03-05: 3200 [IU] via INTRAVENOUS_CENTRAL
  Filled 2012-03-03: qty 1

## 2012-03-03 MED ORDER — ALTEPLASE 2 MG IJ SOLR
2.0000 mg | Freq: Once | INTRAMUSCULAR | Status: AC | PRN
  Filled 2012-03-03: qty 2

## 2012-03-03 MED ORDER — DARBEPOETIN ALFA-POLYSORBATE 150 MCG/0.3ML IJ SOLN
150.0000 ug | INTRAMUSCULAR | Status: DC
  Administered 2012-03-04: 150 ug via INTRAVENOUS
  Filled 2012-03-03: qty 0.3

## 2012-03-03 MED ORDER — METHADONE HCL 10 MG PO TABS
5.0000 mg | ORAL_TABLET | Freq: Three times a day (TID) | ORAL | Status: DC
  Administered 2012-03-03 – 2012-03-05 (×6): 5 mg via ORAL
  Administered 2012-03-05: 10:00:00 via ORAL
  Filled 2012-03-03 (×2): qty 1
  Filled 2012-03-03: qty 2
  Filled 2012-03-03 (×4): qty 1

## 2012-03-03 MED ORDER — SODIUM CHLORIDE 0.9 % IV SOLN: 100.0000 mL | INTRAVENOUS | Status: DC | PRN

## 2012-03-03 MED ORDER — NEPRO/CARBSTEADY PO LIQD
237.0000 mL | Freq: Three times a day (TID) | ORAL | Status: DC
  Administered 2012-03-03 – 2012-03-05 (×4): 237 mL via ORAL

## 2012-03-03 MED ORDER — ONDANSETRON HCL 4 MG/2ML IJ SOLN
4.0000 mg | Freq: Four times a day (QID) | INTRAMUSCULAR | Status: DC | PRN
  Administered 2012-03-03: 4 mg via INTRAVENOUS
  Filled 2012-03-03: qty 2

## 2012-03-03 MED ORDER — PENTAFLUOROPROP-TETRAFLUOROETH EX AERO: 1.0000 "application " | INHALATION_SPRAY | CUTANEOUS | Status: DC | PRN

## 2012-03-03 MED ORDER — HEPARIN SODIUM (PORCINE) 1000 UNIT/ML DIALYSIS
100.0000 [IU]/kg | INTRAMUSCULAR | Status: DC | PRN
  Administered 2012-03-04: 6000 [IU] via INTRAVENOUS_CENTRAL
  Filled 2012-03-03: qty 6

## 2012-03-03 MED ORDER — HYDROMORPHONE HCL 2 MG PO TABS
2.0000 mg | ORAL_TABLET | Freq: Three times a day (TID) | ORAL | Status: DC
  Administered 2012-03-03 – 2012-03-05 (×8): 2 mg via ORAL
  Filled 2012-03-03 (×7): qty 1

## 2012-03-03 MED ORDER — FERUMOXYTOL INJECTION 510 MG/17 ML
510.0000 mg | Freq: Once | INTRAVENOUS | Status: DC
  Filled 2012-03-03: qty 17

## 2012-03-03 MED ORDER — NEPRO/CARBSTEADY PO LIQD: 237.0000 mL | ORAL | Status: DC | PRN

## 2012-03-03 MED ORDER — SODIUM CHLORIDE 0.9 % IV SOLN
25.0000 mg | Freq: Once | INTRAVENOUS | Status: AC
  Administered 2012-03-03: 25 mg via INTRAVENOUS
  Filled 2012-03-03: qty 0.5

## 2012-03-03 MED ORDER — SODIUM CHLORIDE 0.9 % IV SOLN
500.0000 mg | INTRAVENOUS | Status: AC
  Administered 2012-03-04: 500 mg via INTRAVENOUS
  Filled 2012-03-03: qty 10

## 2012-03-03 NOTE — Consult Note (Signed)
[facility] CONSULT NOTE    Date: 03/03/2012                  Patient Name:  [name]  MRN: [id number]  DOB: [birth year]  Age / Sex: 61 y.o., female         PCP: [name], MD                 Service Requesting Consult: Dr. Andreas Blower                 Reason for Consult: ESRD on HD            History of Present Illness: Patient is a 61 y.o. female  with history of chronic abdominal pain, gallstone pancreatitis, coronary artery disease status post CABG, ischemic cardiomyopathy, end-stage renal disease on hemodialysis, diet controlled DM, HTN, HLD, anemia, CHF who presented with shortness of breath and abdominal pain, was found to have HCAP given recent hospitalization. She was started on Vanc, Cefepime and Levaquin initially, but is now on Levaquin PO only.   Patient was previously on peritoneal dialysis but was transitioned to HD in April 2013 due to peritonitis as well as to improve chronic abdominal pain. She is currently receiving HD on MWF at [facility].  At this time, she does make urine but no urine output has been recorded. Her Creat is 5.2 today. She does have anemia with a HgB of 8.1 s/p 1 unit PRBC on 02/23/12. Her access is right tunnel IJ that has been in place since April. We were consulted to resume to her hemodialysis during hospitalization.     Hx severe anemia and Fe defic.  Hx varices and liver dz with bleeding (?fatty liver).  Chronic edema.  On dialysis about 1 1/2 yr , presumeable DM.  Has lost over 100 lb in past 2 yr.  Very poor appetite.   Medications: Outpatient medications: Prescriptions prior to admission  Medication Sig Dispense Refill  . HYDROmorphone (DILAUDID) 2 MG tablet Take 2 mg by mouth 3 (three) times daily. Takes along with methadone.      . methadone (DOLOPHINE) 5 MG tablet Take 5 mg by mouth 3 (three) times daily. Takes along with Dilaudid.      . multivitamin (RENA-VIT) TABS tablet Take 1 tablet by mouth daily.       [name] oxycodone (OXY-IR) 5 MG capsule Take 5 mg by mouth every 4 (four) hours as needed.      . pravastatin (PRAVACHOL) 20 MG tablet Take 20 mg by mouth at bedtime.       . ranolazine (RANEXA) 500 MG 12 hr tablet Take 500 mg by mouth daily.        . sevelamer (RENAGEL) 800 MG tablet Take 800 mg by mouth 3 (three) times daily with meals.         Current medications: Current Facility-Administered Medications  Medication Dose Route Frequency Provider Last Rate Last Dose  . 0.9 %  sodium chloride infusion  250 mL Intravenous PRN [name], MD      . ceFEPIme (MAXIPIME) 1 g in dextrose 5 % 50 mL IVPB  1 g Intravenous Q24H [name], MD   1 g at 03/02/12 2134  . heparin injection 5,000 Units  5,000 Units Subcutaneous Q8H [name], MD   5,000 Units at 03/03/12 4696  . HYDROmorphone (DILAUDID) injection 0.5 mg  0.5 mg Intravenous Once Purvis Sheffield, MD   0.5  mg at 03/02/12 1530  . HYDROmorphone (DILAUDID) injection 0.5 mg  0.5 mg Intravenous Once Purvis Sheffield, MD   0.5 mg at 03/02/12 1750  . HYDROmorphone (DILAUDID) injection 0.5 mg  0.5 mg Intravenous Q4H PRN [name], MD      . HYDROmorphone (DILAUDID) tablet 2 mg  2 mg Oral TID [name], MD   2 mg at 03/03/12 0912  . insulin aspart (novoLOG) injection 0-9 Units  0-9 Units Subcutaneous TID WC [name], MD      . levofloxacin (LEVAQUIN) IVPB 500 mg  500 mg Intravenous Q48H [name], MD      . levofloxacin (LEVAQUIN) IVPB 750 mg  750 mg Intravenous Once Purvis Sheffield, MD   750 mg at 03/02/12 1819  . methadone (DOLOPHINE) tablet 5 mg  5 mg Oral TID [name], MD   5 mg at 03/03/12 0912  . multivitamin (RENA-VIT) tablet 1 tablet  1 tablet Oral QHS [name], MD   1 tablet at 03/02/12 2203  . ondansetron (ZOFRAN) injection 4 mg  4 mg Intravenous Q6H PRN [name], MD   4 mg at 03/03/12 0823  . oxyCODONE (Oxy IR/ROXICODONE) immediate release tablet 5 mg  5 mg Oral Q4H PRN [name], MD       . ranolazine (RANEXA) 12 hr tablet 500 mg  500 mg Oral Daily [name], MD   500 mg at 03/03/12 0912  . sevelamer (RENVELA) tablet 800 mg  800 mg Oral TID WC [name], MD      . simvastatin (ZOCOR) tablet 5 mg  5 mg Oral QHS [name], MD   5 mg at 03/02/12 2202  . sodium chloride 0.9 % bolus 250 mL  250 mL Intravenous STAT Purvis Sheffield, MD   250 mL at 03/02/12 1528  . sodium chloride 0.9 % injection 3 mL  3 mL Intravenous Q12H [name], MD   3 mL at 03/02/12 2209  . sodium chloride 0.9 % injection 3 mL  3 mL Intravenous PRN [name], MD      . vancomycin (VANCOCIN) IVPB 1000 mg/200 mL premix  1,000 mg Intravenous Once Purvis Sheffield, MD   1,000 mg at 03/02/12 1710  . DISCONTD: ceFEPIme (MAXIPIME) 1 g in dextrose 5 % 50 mL IVPB  1 g Intravenous Q8H Srikar Cherlynn Kaiser, MD      . DISCONTD: ceFEPIme (MAXIPIME) 2 g in dextrose 5 % 50 mL IVPB  2 g Intravenous Q12H Purvis Sheffield, MD      . DISCONTD: HYDROmorphone (DILAUDID) injection 0.5 mg  0.5 mg Intravenous Q6H PRN [name], MD      . DISCONTD: levofloxacin (LEVAQUIN) IVPB 750 mg  750 mg Intravenous Q24H [name], MD      . DISCONTD: levofloxacin (LEVAQUIN) IVPB 750 mg  750 mg Intravenous Q48H Srikar Cherlynn Kaiser, MD      . DISCONTD: oxycodone (OXY-IR) immediate release capsule 5 mg  5 mg Oral Q4H PRN [name], MD      . DISCONTD: sodium chloride 0.9 % bolus 250 mL  250 mL Intravenous STAT Purvis Sheffield, MD      . DISCONTD: vancomycin (VANCOCIN) 500 mg in sodium chloride 0.9 % 100 mL IVPB  500 mg Intravenous Q M,W,F-HD [name], MD          Allergies: Allergies  Allergen Reactions  . Morphine And Related  Anaphylaxis  . Codeine Itching  . Lisinopril Other (See Comments)    unknown      Past Medical History: Past Medical History  Diagnosis Date  . Gallstone pancreatitis   . Coronary artery disease     s/p CABG 2008 with multiple PCIs  . Ischemic cardiomyopathy   . End stage  renal disease   . Diabetes mellitus   . Hypertension   . History of nonadherence to medical treatment   . LBBB (left bundle branch block)   . Hyperlipidemia   . Renal insufficiency   . CHF (congestive heart failure)   . Dialysis patient   . Anemia      Past Surgical History: Past Surgical History  Procedure Date  . Coronary artery bypass graft 2008  . Cholecystectomy   . Abdominal hysterectomy   . Tubal ligation   . Tonsillectomy   . Insert / replace / remove pacemaker     pacemaker ICD  . Esophagogastroduodenoscopy 09/15/2011    Procedure: ESOPHAGOGASTRODUODENOSCOPY (EGD);  Surgeon: Theda Belfast, MD;  Location: [facility] ENDOSCOPY;  Service: Endoscopy;  Laterality: N/A;     Family History: Family History  Problem Relation Age of Onset  . Coronary artery disease Mother   . Hypertension Mother   . Diabetes Mother   . Diabetes Sister   . Anesthesia problems Neg Hx      Social History: History   Social History  . Marital Status: Single    Spouse Name: N/A    Number of Children: N/A  . Years of Education: N/A   Occupational History  . Not on file.   Social History Main Topics  . Smoking status: Former Smoker    Quit date: 06/15/2000  . Smokeless tobacco: Not on file  . Alcohol Use: No  . Drug Use: No  . Sexually Active: Not on file   Other Topics Concern  . Not on file   Social History Narrative   The patient lives alone in [place].  She is a retired nurse-Psychiatric and ortho.  She is disabled and receives most of her care from the [place], [place].  Son-shawn-[phone] alone    Review of Systems: As per HPI. Continues to have abdominal pain and SOB.  Vital Signs: Blood pressure 130/93, pulse 90, temperature 97.6 F (36.4 C), temperature source Oral, resp. rate 20, height 5' 4.5" (1.638 m), weight 131 lb 11.2 oz (59.739 kg), SpO2 97.00%.  Weight trends: Filed Weights   03/02/12 1440 03/02/12 2147  Weight: 130 lb (58.968 kg) 131 lb 11.2 oz  (59.739 kg)    Physical Exam: General: Vital signs reviewed and noted. Thin AAF. Very pleasant Scarring in fundi  Head: Normocephalic, atraumatic. [place] in place, but patient removed it  Eyes: EOMI, No signs of jaundince. Limited RR of right eye and only able to visualize some vessels superiorly. RR more pronounced in left, but still decreased. Unable to see fundi on left either  Nose: Mucous membranes moist  Throat: Oropharynx nonerythematous, no exudate appreciated.   Neck: No deformities or masses A&P cervical LN  Lungs:  Normal respiratory effort. Clear to auscultation BL without crackles or wheezes. Decreased bs  Chest: RRR. ICD left chest. Healed scar from port-a-cath left chest. Right chest with tunneled IJ dressing C/D/I  Gr 2/6 holosys M at LLSB  Abdomen:  BS normoactive. Soft, diffusely TTP most pronounced in epigastric region. Unable to appreciate liver edge secondary to pain with exam Liver down 6 cm  Extremities: 2+ pitting  edema to the knee. Weak distal pulses palpated (R foot > L foot)  Cool extrem, no palp PT and only Tr R DP, 3+ edema  Neurologic: A&O X3, grossly normal  Skin: No visible rashes   Lab results: Basic Metabolic Panel:  Lab 03/03/12 1478 03/02/12 1500  NA 134* 133*  K 4.7 4.5  CL 98 95*  CO2 24 22  GLUCOSE 119* 158*  BUN 34* 28*  CREATININE 5.20* 4.72*  CALCIUM 11.2* 11.1*  MG -- --  PHOS -- --    Liver Function Tests:  Lab 03/02/12 1500  AST 30  ALT 10  ALKPHOS 110  BILITOT 0.5  PROT 7.7  ALBUMIN 2.4*    Lab 03/02/12 1500  LIPASE 9*  AMYLASE --   No results found for this basename: AMMONIA:3 in the last 168 hours  CBC:  Lab 03/03/12 0705 03/02/12 1500  WBC 16.4* 15.6*  NEUTROABS -- 13.7*  HGB 8.1* 8.0*  HCT 27.7* 27.9*  MCV 85.8 85.8  PLT 258 338    Cardiac Enzymes: No results found for this basename: CKTOTAL:5,CKMB:5,CKMBINDEX:5,TROPONINI:5 in the last 168 hours  BNP: No components found with this basename:  POCBNP:3  CBG:  Lab 03/03/12 1119 03/03/12 0729 03/02/12 2143  GLUCAP 149* 108* 117*    Microbiology: Results for orders placed during the hospital encounter of 02/23/12  MRSA PCR SCREENING     Status: Normal   Collection Time   02/24/12  4:59 AM      Component Value Range Status Comment   MRSA by PCR NEGATIVE  NEGATIVE Final   CULTURE, BLOOD (ROUTINE X 2)     Status: Normal   Collection Time   02/24/12  6:25 AM      Component Value Range Status Comment   Specimen Description BLOOD LEFT HAND   Final    Special Requests     Final    Value: BOTTLES DRAWN AEROBIC AND ANAEROBIC 10CC BLUE,5CC RED   Culture  Setup Time 02/24/2012 08:30   Final    Culture NO GROWTH 5 DAYS   Final    Report Status 03/01/2012 FINAL   Final   CULTURE, BLOOD (ROUTINE X 2)     Status: Normal   Collection Time   02/24/12  6:35 AM      Component Value Range Status Comment   Specimen Description BLOOD LEFT HAND   Final    Special Requests BOTTLES DRAWN AEROBIC ONLY 10CC   Final    Culture  Setup Time 02/24/2012 08:30   Final    Culture NO GROWTH 5 DAYS   Final    Report Status 03/01/2012 FINAL   Final     Coagulation Studies: No results found for this basename: LABPROT:3,INR:3 in the last 72 hours  Urinalysis: No results found for this basename: COLORURINE:2,APPERANCEUR:2,LABSPEC:2,PHURINE:2,GLUCOSEU:2,HGBUR:2,BILIRUBINUR:2,KETONESUR:2,PROTEINUR:2,UROBILINOGEN:2,NITRITE:2,LEUKOCYTESUR:2 in the last 72 hours   Imaging: Dg Chest 2 View  03/02/2012  *RADIOLOGY REPORT*  Clinical Data: Shortness of breath and abdominal pain  CHEST - 2 VIEW  Comparison: 02/23/2012  Findings: There is a left chest wall AICD with leads in the right atrial appendage, coronary sinus and right ventricle.  The patient is status post sternotomy and CABG procedure.  There is a right chest wall dialysis catheter with tips in the right atrium.  Stable cardiac enlargement.  No pleural effusion or edema. Interval development of right upper  lobe airspace opacity.  IMPRESSION:  1.  Right upper lobe airspace opacity concerning for pneumonia. 2.  Cardiac enlargement.  Original Report Authenticated By: Rosealee Albee, M.D.     Assessment & Plan: Pt is a 61 y.o. yo female with history of chronic abdominal pain, gallstone pancreatitis, coronary artery disease status post CABG, ischemic cardiomyopathy, end-stage renal disease on hemodialysis, diet controlled diabetes, hypertension, hyperlipidemia, anemia, and CHF who was admitted for HCAP. Nephrology was consulted to resume hemodialysis during hospitalization  1. ESRD- Patient currently appears fluid overloaded. Continue HD on MWF schedule. HD unit will call her outpatient center to get records prior to treatment tomorrow. Her access is right tunnel IJ. She does not have plans for permanent access at this time. - Monitor strict I/O - PTH elevated last week to 101. Calcium also elevated to 11.2. Continue Renvela. - Daily labs (renal panel, CBC) - We will arrange for HD tomorrow to keep on her MWF schedule. Patient agrees. Very vol overloaded. Needs decrease with low UFR.  Discussed perm access 2. Anemia- HgB 8.1. Appears she has a baseline of 8-9. She did receive one unit of blood last week. No obvious source of bleeding at this time. Patient states she used to give herself Aranesp when on peritoneal dialysis, but she is not sure if she is receiving anything now. - Does not appear that she is on ESA. Would consider starting Aranesp, if she is not on this as an outpatient. - Iron last checked last week which showed low iron of 21, sat ratio of 14 (low) and elevated ferritin of 2657. Could possibly benefit from iron supplementation while in the hospital.  Ferritin ^^^, will only use 1 dose Fe and use high dose EPO 3. PNA- Per primary team. Think it is good idea to switch to PO antibiotics only as soon as possible to decrease IVF.  4. Nutrition- Chronically poor nutrition. Albumin 2.4 at  admission - Monitor PO intake - Change diet to renal diet - Will start on nutritional supplementation while here  5. Dispo- HD tomorrow.  Amber M. Hairford, M.D. 03/03/2012 12:47 PM   I have seen and examined this patient and agree with the plan of care with changes noted. Needs longer freq HD and monitoring.  Needs eval of Anemia. Needs perm access. .  Bricelyn Freestone L 03/03/2012, 2:29 PM

## 2012-03-03 NOTE — Evaluation (Signed)
Physical Therapy Evaluation Patient Details Name: [name] MRN: [id number] DOB: [birth year] Today's Date: 03/03/2012 Time: 4098-1191 PT Time Calculation (min): 24 min  PT Assessment / Plan / Recommendation Clinical Impression  Pt admitted with PNA and SOB. Pt lives alone and does not have 24hr supervision but pt states she is not open to ALF or any other facility and wants to go home and has assist when dgtr is in town (currently in [place]). Pt with oxygen sat 94% on RA at rest and dropped to 90% with ambulation in room. Pt will benefit from acute therapy to maximize mobility, gait, transfers and function prior to discharge to increase safety and independence.     PT Assessment  Patient needs continued PT services    Follow Up Recommendations  Home health PT;Supervision for mobility/OOB    Barriers to Discharge Decreased caregiver support      Equipment Recommendations  None recommended by PT    Recommendations for Other Services OT consult   Frequency Min 3X/week    Precautions / Restrictions Precautions Precautions: Fall   Pertinent Vitals/Pain Sacral pain, pt seated on pillow 5/10      Mobility  Bed Mobility Bed Mobility: Not assessed Transfers Transfers: Sit to Stand;Stand to Sit Sit to Stand: 5: Supervision;From chair/3-in-1;With armrests Stand to Sit: 5: Supervision;To chair/3-in-1;With armrests Details for Transfer Assistance: cueing for hand placement and safety Ambulation/Gait Ambulation/Gait Assistance: 4: Min guard Ambulation Distance (Feet): 30 Feet Assistive device: Rolling walker Ambulation/Gait Assistance Details: cueing for posture and position in RW, pt denied further ambulation due to fatigue Gait Pattern: Step-through pattern;Decreased stride length;Trunk flexed Gait velocity: decreased Stairs: No    Exercises General Exercises - Lower Extremity Long Arc Quad: AROM;Both;15 reps;Seated Hip Flexion/Marching: AROM;Both;15 reps;Seated   PT  Diagnosis: Difficulty walking;Acute pain  PT Problem List: Decreased knowledge of use of DME;Decreased activity tolerance;Decreased mobility;Decreased safety awareness;Pain PT Treatment Interventions: Gait training;DME instruction;Functional mobility training;Therapeutic activities;Therapeutic exercise;Patient/family education   PT Goals Acute Rehab PT Goals PT Goal Formulation: With patient Time For Goal Achievement: 03/17/12 Potential to Achieve Goals: Fair Pt will go Supine/Side to Sit: with modified independence;with HOB 0 degrees PT Goal: Supine/Side to Sit - Progress: Goal set today Pt will go Sit to Supine/Side: with modified independence;with HOB 0 degrees PT Goal: Sit to Supine/Side - Progress: Goal set today Pt will go Sit to Stand: with modified independence PT Goal: Sit to Stand - Progress: Goal set today Pt will go Stand to Sit: with modified independence PT Goal: Stand to Sit - Progress: Goal set today Pt will Ambulate: >150 feet;with supervision;with least restrictive assistive device PT Goal: Ambulate - Progress: Goal set today  Visit Information  Last PT Received On: 03/03/12 Assistance Needed: +1    Subjective Data  Subjective: My dgtr stays between here and [place] but she is planning to move with me sometime Patient Stated Goal: return home   Prior Functioning  Home Living Lives With: Alone Available Help at Discharge: Family;Available PRN/intermittently;Personal care attendant Type of Home: Apartment Home Access: Level entry Home Layout: One level Bathroom Shower/Tub: Engineer, manufacturing systems: Standard Home Adaptive Equipment: Tub transfer bench;Walker - rolling;Wheelchair - manual Prior Function Level of Independence: Needs assistance Needs Assistance: Light Housekeeping;Meal Prep;Bathing Bath: Maximal Meal Prep: Total Light Housekeeping: Total Able to Take Stairs?: No Driving: No Comments: Pt states she can dress herself and sponge bathes with  aide 2x/wk and gets in shower with aide 1x/wk and family and friends do housework and  cooking. People are in and out but no consistent supervision during day Communication Communication: No difficulties    Cognition  Overall Cognitive Status: Impaired Area of Impairment: Safety/judgement Arousal/Alertness: Awake/alert Orientation Level: Appears intact for tasks assessed Behavior During Session: [place] [facility] for tasks performed Safety/Judgement: Decreased awareness of need for assistance    Extremity/Trunk Assessment Right Lower Extremity Assessment RLE ROM/Strength/Tone: [facility] for tasks assessed Left Lower Extremity Assessment LLE ROM/Strength/Tone: [facility] for tasks assessed Trunk Assessment Trunk Assessment: Kyphotic   Balance    End of Session PT - End of Session Equipment Utilized During Treatment: Gait belt Activity Tolerance: Patient limited by fatigue Patient left: in chair;with call bell/phone within reach  GP     Delorse Lek 03/03/2012, 12:41 PM  Delaney Meigs, PT [phone]

## 2012-03-03 NOTE — Progress Notes (Signed)
Occupational Therapy Treatment Patient Details Name: [name] MRN: [id number] DOB: 05/05/1951 Today's Date: 03/03/2012 Time: 4098-1191 OT Time Calculation (min): 25 min  OT Assessment / Plan / Recommendation Comments on Treatment Session      Follow Up Recommendations  No OT follow up;Supervision/Assistance - 24 hour    Barriers to Discharge  Decreased caregiver support Daughter drives back and forth from [place].  Equipment Recommendations  None recommended by OT    Recommendations for Other Services    Frequency Min 2X/week   Plan      Precautions / Restrictions Precautions Precautions: Fall   Pertinent Vitals/Pain See vitals    ADL  Eating/Feeding: Performed;Independent Where Assessed - Eating/Feeding: Chair Lower Body Bathing: Simulated;Modified independent Where Assessed - Lower Body Bathing: Unsupported sitting Lower Body Dressing: Performed;Modified independent Where Assessed - Lower Body Dressing: Unsupported sitting Toilet Transfer: Simulated;Min guard Toilet Transfer Method: Stand pivot Acupuncturist: Other (comment) (chair) Equipment Used: Gait belt Transfers/Ambulation Related to ADLs: min guard for safety ADL Comments: Pt near baseline but requires min guard assist for standing components of ADLs. Discussed with pt continued use of tub bench as seat for safety.  Pt reports she has been fatiguing at home easily.    OT Diagnosis: Generalized weakness;Acute pain;Cognitive deficits  OT Problem List: Decreased activity tolerance;Decreased safety awareness;Pain OT Treatment Interventions: Self-care/ADL training;DME and/or AE instruction;Therapeutic activities;Patient/family education   OT Goals Acute Rehab OT Goals OT Goal Formulation: With patient Time For Goal Achievement: 03/10/12 Potential to Achieve Goals: Good ADL Goals Pt Will Transfer to Toilet: with modified independence;Ambulation;with DME;Comfort height toilet ADL Goal: Toilet  Transfer - Progress: Goal set today Miscellaneous OT Goals Miscellaneous OT Goal #1: Pt will independently verbalize 3 energy conservation techniques she will incorporate into ADL activity. OT Goal: Miscellaneous Goal #1 - Progress: Goal set today  Visit Information  Last OT Received On: 03/03/12 Assistance Needed: +1    Subjective Data      Prior Functioning  Home Living Lives With: Alone Available Help at Discharge: Family;Available PRN/intermittently;Personal care attendant Type of Home: Apartment Home Access: Level entry Home Layout: One level Bathroom Shower/Tub: Engineer, manufacturing systems: Standard Home Adaptive Equipment: Bedside commode/3-in-1;Tub transfer bench;Wheelchair - manual;Walker - rolling Prior Function Level of Independence: Needs assistance Needs Assistance: Light Housekeeping;Meal Prep;Bathing Bath: Maximal Meal Prep: Total Light Housekeeping: Total Able to Take Stairs?: No Driving: No Comments: Pt states she can dress herself and sponge bathes with aide 2x/wk and gets in shower with aide 1x/wk and family and friends do housework and cooking. People are in and out but no consistent supervision during day Communication Communication: No difficulties Dominant Hand: Right    Cognition  Overall Cognitive Status: Impaired Area of Impairment: Safety/judgement Arousal/Alertness: Awake/alert Orientation Level: Appears intact for tasks assessed Behavior During Session: [facility] for tasks performed Safety/Judgement: Decreased awareness of need for assistance Safety/Judgement - Other Comments: Pt attempting to stand from chair before OT could walk into room to assist.    Mobility  Shoulder Instructions Bed Mobility Bed Mobility: Not assessed Transfers Transfers: Sit to Stand;Stand to Sit Sit to Stand: 5: Supervision;From chair/3-in-1;With upper extremity assist Stand to Sit: 5: Supervision;To chair/3-in-1;With upper extremity assist;With armrests Details  for Transfer Assistance: cueing for hand placement and safety       Exercises  General Exercises - Lower Extremity Long Arc Quad: AROM;Both;15 reps;Seated Hip Flexion/Marching: AROM;Both;15 reps;Seated   Balance     End of Session OT - End of Session Equipment Utilized  During Treatment: Gait belt Activity Tolerance: Patient tolerated treatment well Patient left: in chair;with call bell/phone within reach Nurse Communication: Mobility status  GO    03/03/2012 Cipriano Mile OTR/L Pager [phone] Office [phone]  Cipriano Mile 03/03/2012, 4:28 PM

## 2012-03-03 NOTE — Progress Notes (Signed)
Thank you to [facility] RN CM for this referral.  Patient is currently being followed by [facility] for PCP services and is therefore ineligible for [facility] Care Management services.  For any additional questions or new referrals please contact Anibal Henderson BSN RN [facility] Liaison at [phone].

## 2012-03-03 NOTE — ED Provider Notes (Signed)
I saw and evaluated the patient, reviewed the resident's note and I agree with the findings and plan.  Elsi Stelzer, MD 03/03/12 1650 

## 2012-03-03 NOTE — Progress Notes (Signed)
Subjective: Breathing about the same as yesterday. Complaining of abdominal pain, which is chronic, reports that she is on methadone and dilaudid at home which was not in her admission medication reconciliation. Complaining of low back pain.  Objective: Vital signs in last 24 hours: Filed Vitals:   03/02/12 1840 03/02/12 1910 03/02/12 2147 03/03/12 0518  BP: 142/94 154/97 127/86 136/90  Pulse: 109 97 103 98  Temp: 98 F (36.7 C) 97.8 F (36.6 C) 98.2 F (36.8 C) 98.6 F (37 C)  TempSrc: Oral Oral Oral Oral  Resp: 20 20 20 19   Height:   5' 4.5" (1.638 m)   Weight:   59.739 kg (131 lb 11.2 oz)   SpO2:  97% 100% 92%   Weight change:   Intake/Output Summary (Last 24 hours) at 03/03/12 0805 Last data filed at 03/02/12 2154  Gross per 24 hour  Intake    240 ml  Output      0 ml  Net    240 ml    Physical Exam: General: Awake, Oriented, in some distress for abdominal pain.  HEENT: EOMI. Neck: Supple CV: S1 and S2 Lungs: Clear to ascultation bilaterally Abdomen: Soft, Nontender, Nondistended, +bowel sounds. Ext: Good pulses. Trace edema.  Lab Results: Basic Metabolic Panel:  Lab 03/03/12 4540 03/02/12 1500  NA 134* 133*  K 4.7 4.5  CL 98 95*  CO2 24 22  GLUCOSE 119* 158*  BUN 34* 28*  CREATININE 5.20* 4.72*  CALCIUM 11.2* 11.1*  MG -- --  PHOS -- --   Liver Function Tests:  Lab 03/02/12 1500  AST 30  ALT 10  ALKPHOS 110  BILITOT 0.5  PROT 7.7  ALBUMIN 2.4*    Lab 03/02/12 1500  LIPASE 9*  AMYLASE --   No results found for this basename: AMMONIA:5 in the last 168 hours CBC:  Lab 03/03/12 0705 03/02/12 1500  WBC 16.4* 15.6*  NEUTROABS -- 13.7*  HGB 8.1* 8.0*  HCT 27.7* 27.9*  MCV 85.8 85.8  PLT 258 338   Cardiac Enzymes: No results found for this basename: CKTOTAL:5,CKMB:5,CKMBINDEX:5,TROPONINI:5 in the last 168 hours BNP (last 3 results)  Basename 03/02/12 1633 06/02/11 2034  PROBNP >70000.0* 31365.0*   CBG:  Lab 03/03/12 0729 03/02/12  2143 02/25/12 1131 02/25/12 0819  GLUCAP 108* 117* 145* 83   No results found for this basename: HGBA1C:5 in the last 72 hours Other Labs: No components found with this basename: POCBNP:3 No results found for this basename: DDIMER:2 in the last 168 hours No results found for this basename: CHOL:2,HDL:2,LDLCALC:2,TRIG:2,CHOLHDL:2,LDLDIRECT:2 in the last 168 hours No results found for this basename: TSH,T4TOTAL,FREET3,T3FREE,FREET4,THYROIDAB in the last 168 hours No results found for this basename: VITAMINB12:2,FOLATE:2,FERRITIN:2,TIBC:2,IRON:2,RETICCTPCT:2 in the last 168 hours  Micro Results: Recent Results (from the past 240 hour(s))  MRSA PCR SCREENING     Status: Normal   Collection Time   02/24/12  4:59 AM      Component Value Range Status Comment   MRSA by PCR NEGATIVE  NEGATIVE Final   CULTURE, BLOOD (ROUTINE X 2)     Status: Normal   Collection Time   02/24/12  6:25 AM      Component Value Range Status Comment   Specimen Description BLOOD LEFT HAND   Final    Special Requests     Final    Value: BOTTLES DRAWN AEROBIC AND ANAEROBIC 10CC BLUE,5CC RED   Culture  Setup Time 02/24/2012 08:30   Final    Culture NO GROWTH  5 DAYS   Final    Report Status 03/01/2012 FINAL   Final   CULTURE, BLOOD (ROUTINE X 2)     Status: Normal   Collection Time   02/24/12  6:35 AM      Component Value Range Status Comment   Specimen Description BLOOD LEFT HAND   Final    Special Requests BOTTLES DRAWN AEROBIC ONLY 10CC   Final    Culture  Setup Time 02/24/2012 08:30   Final    Culture NO GROWTH 5 DAYS   Final    Report Status 03/01/2012 FINAL   Final     Studies/Results: Dg Chest 2 View  03/02/2012  *RADIOLOGY REPORT*  Clinical Data: Shortness of breath and abdominal pain  CHEST - 2 VIEW  Comparison: 02/23/2012  Findings: There is a left chest wall AICD with leads in the right atrial appendage, coronary sinus and right ventricle.  The patient is status post sternotomy and CABG procedure.  There  is a right chest wall dialysis catheter with tips in the right atrium.  Stable cardiac enlargement.  No pleural effusion or edema. Interval development of right upper lobe airspace opacity.  IMPRESSION:  1.  Right upper lobe airspace opacity concerning for pneumonia. 2.  Cardiac enlargement.   Original Report Authenticated By: Rosealee Albee, M.D.     Medications: I have reviewed the patient's current medications. Scheduled Meds:   . ceFEPime (MAXIPIME) IV  1 g Intravenous Q24H  . heparin  5,000 Units Subcutaneous Q8H  .  HYDROmorphone (DILAUDID) injection  0.5 mg Intravenous Once  .  HYDROmorphone (DILAUDID) injection  0.5 mg Intravenous Once  . insulin aspart  0-9 Units Subcutaneous TID WC  . levofloxacin (LEVAQUIN) IV  500 mg Intravenous Q48H  . levofloxacin (LEVAQUIN) IV  750 mg Intravenous Once  . multivitamin  1 tablet Oral QHS  . ranolazine  500 mg Oral Daily  . sevelamer  800 mg Oral TID WC  . simvastatin  5 mg Oral QHS  . sodium chloride  250 mL Intravenous STAT  . sodium chloride  3 mL Intravenous Q12H  . vancomycin  500 mg Intravenous Q M,W,F-HD  . vancomycin  1,000 mg Intravenous Once  . DISCONTD: ceFEPime (MAXIPIME) IV  1 g Intravenous Q8H  . DISCONTD: ceFEPime (MAXIPIME) IV  2 g Intravenous Q12H  . DISCONTD: levofloxacin (LEVAQUIN) IV  750 mg Intravenous Q24H  . DISCONTD: levofloxacin (LEVAQUIN) IV  750 mg Intravenous Q48H  . DISCONTD: sodium chloride  250 mL Intravenous STAT   Continuous Infusions:  PRN Meds:.sodium chloride, HYDROmorphone (DILAUDID) injection, ondansetron (ZOFRAN) IV, oxyCODONE, sodium chloride, DISCONTD:  HYDROmorphone (DILAUDID) injection, DISCONTD: oxycodone  Assessment/Plan: Acute respiratory failure/shortness of breath secondary to healthcare associated pneumonia given recent hospitalization for about 2 days.  Chest x-ray 2 view on 03/02/2012 showed right upper lobe airspace opacity concerning for pneumonia. Discontinue vancomycin. Continue  cefepime, and levofloxacin. Transition to oral levofloxacin monotherapy tomorrow depending on patient's clinical course. Minimize IV fluids given history of chronic systolic heart failure. Antibiotics since 03/03/2012.  Generalized weakness  Request PT consultation.   Chronic abdominal pain  Likely secondary to chronic pancreatitis. Has had recent evaluation and workup. Has been evaluated by general surgery in early September of 2013 who recommended conservative treatment.  Patient reports pain is unchanged from about a month ago. Do not see the need for further workup or evaluation at this time. Patient reports that she is on methadone and dilaudid at home. Currently not  taking any oxycodone. Will request pharmacy to reconcile her medications and then order her home methadone and dilaudid.  End stage renal disease  HD per renal. Continue sevelamer. Last had dialysis on 03/02/2012.   History of ischemic cardiomyopathy/chronic systolic congestive heart failure with EF 20-25% based on ECHO on 10/07/2011  Patient appears compensated at this time. Continue to monitor. Continue Ranolazine.   Diabetes type 2  Hemoglobin A1c 5.7 on 10/04/2011. Stable. Sensitive SSI.   Hypertension  Stable. Not on any antihypertensive medications.   Hyperlipidemia  Continue statin.   Leukocytosis  Chronic. Likely due to pneumonia.   Anemia  Likely due to ESRD, s/p 1 unit of pRBC on 02/23/2012. Continue to monitor.   History of fatty liver with Ascities  Patient gets paracentesis every 2 weeks (last paracentesis ~3 weeks ago). Currently no obvious paracentesis on exam.   Thrombocytopenia  Resolved at this time. Likely due to chronic liver disease.   Prophylaxis  SQ heparin.   Code status  She wishs to be have CPR or chest compressions done, but she does not wish to be on mechanical ventilation or life support (even temporarily).   Disposition  Pending.   LOS: 1 day  Saralynn Langhorst A, MD 03/03/2012, 8:05  AM

## 2012-03-03 NOTE — Progress Notes (Signed)
Patient received test dose of Infed, no reaction. Steele Berg RN

## 2012-03-04 ENCOUNTER — Inpatient Hospital Stay (HOSPITAL_COMMUNITY): Payer: Non-veteran care

## 2012-03-04 LAB — COMPREHENSIVE METABOLIC PANEL
ALT: 6 U/L (ref 0–35)
Alkaline Phosphatase: 90 U/L (ref 39–117)
BUN: 45 mg/dL — ABNORMAL HIGH (ref 6–23)
CO2: 25 mEq/L (ref 19–32)
Chloride: 96 mEq/L (ref 96–112)
GFR calc Af Amer: 8 mL/min — ABNORMAL LOW (ref >90)
GFR calc non Af Amer: 7 mL/min — ABNORMAL LOW (ref >90)
Glucose, Bld: 94 mg/dL (ref 70–99)
Potassium: 4.9 mEq/L (ref 3.5–5.1)
Total Bilirubin: 0.4 mg/dL (ref 0.3–1.2)
Total Protein: 6.9 g/dL (ref 6.0–8.3)

## 2012-03-04 LAB — CBC
HCT: 24.7 % — ABNORMAL LOW (ref 36.0–46.0)
Hemoglobin: 7.2 g/dL — ABNORMAL LOW (ref 12.0–15.0)
MCHC: 29.1 g/dL — ABNORMAL LOW (ref 30.0–36.0)
RBC: 2.88 MIL/uL — ABNORMAL LOW (ref 3.87–5.11)

## 2012-03-04 LAB — GLUCOSE, CAPILLARY
Glucose-Capillary: 139 mg/dL — ABNORMAL HIGH (ref 70–99)
Glucose-Capillary: 154 mg/dL — ABNORMAL HIGH (ref 70–99)

## 2012-03-04 LAB — PHOSPHORUS: Phosphorus: 5.5 mg/dL — ABNORMAL HIGH (ref 2.3–4.6)

## 2012-03-04 MED ORDER — LEVOFLOXACIN 500 MG PO TABS
500.0000 mg | ORAL_TABLET | ORAL | Status: DC
  Administered 2012-03-04: 500 mg via ORAL
  Filled 2012-03-04: qty 1

## 2012-03-04 MED ORDER — HYDROMORPHONE HCL 2 MG PO TABS
ORAL_TABLET | ORAL | Status: AC
  Administered 2012-03-04: 2 mg via ORAL
  Filled 2012-03-04: qty 1

## 2012-03-04 MED ORDER — HYDROMORPHONE HCL 2 MG PO TABS: 2.0000 mg | ORAL_TABLET | Freq: Three times a day (TID) | ORAL | Status: DC

## 2012-03-04 MED ORDER — METHADONE HCL 5 MG PO TABS: 5.0000 mg | ORAL_TABLET | Freq: Three times a day (TID) | ORAL | Status: DC

## 2012-03-04 MED ORDER — DARBEPOETIN ALFA-POLYSORBATE 150 MCG/0.3ML IJ SOLN: 150.0000 ug | INTRAMUSCULAR | Status: DC

## 2012-03-04 MED ORDER — LEVOFLOXACIN 500 MG PO TABS: 500.0000 mg | ORAL_TABLET | ORAL | Status: DC

## 2012-03-04 MED ORDER — HEPARIN SODIUM (PORCINE) 1000 UNIT/ML DIALYSIS
20.0000 [IU]/kg | INTRAMUSCULAR | Status: DC | PRN
  Administered 2012-03-05: 1200 [IU] via INTRAVENOUS_CENTRAL
  Filled 2012-03-04: qty 2

## 2012-03-04 MED ORDER — NEPRO/CARBSTEADY PO LIQD: 237.0000 mL | Freq: Three times a day (TID) | ORAL | Status: DC

## 2012-03-04 MED ORDER — ONDANSETRON HCL 4 MG PO TABS
4.0000 mg | ORAL_TABLET | Freq: Three times a day (TID) | ORAL | Status: DC | PRN
  Administered 2012-03-04: 4 mg via ORAL
  Filled 2012-03-04: qty 1

## 2012-03-04 MED ORDER — DARBEPOETIN ALFA-POLYSORBATE 150 MCG/0.3ML IJ SOLN
INTRAMUSCULAR | Status: AC
  Administered 2012-03-04: 150 ug via INTRAVENOUS
  Filled 2012-03-04: qty 0.3

## 2012-03-04 NOTE — Progress Notes (Signed)
Subjective: Breathing better today. Abdominal pain improved with methadone and oral dilaudid.   Objective: Vital signs in last 24 hours: Filed Vitals:   03/03/12 1752 03/03/12 2055 03/04/12 0525 03/04/12 0740  BP: 138/90 135/98 128/82 127/81  Pulse: 91 96 87 84  Temp: 97.7 F (36.5 C) 97.6 F (36.4 C) 98.7 F (37.1 C) 97 F (36.1 C)  TempSrc: Oral Oral Oral Oral  Resp: 20 20 18 21   Height:  5' 4.5" (1.638 m)    Weight:  61.236 kg (135 lb)  60.7 kg (133 lb 13.1 oz)  SpO2: 97% 98% 96% 100%   Weight change: 2.268 kg (5 lb)  Intake/Output Summary (Last 24 hours) at 03/04/12 0759 Last data filed at 03/04/12 0528  Gross per 24 hour  Intake   1182 ml  Output      0 ml  Net   1182 ml    Physical Exam: General: Awake, Oriented, in some distress for abdominal pain.  HEENT: EOMI. Neck: Supple CV: S1 and S2 Lungs: Clear to ascultation bilaterally Abdomen: Soft, Nontender, Nondistended, +bowel sounds. Ext: Good pulses. Trace edema.  Lab Results: Basic Metabolic Panel:  Lab 03/03/12 1610 03/02/12 1500  NA 134* 133*  K 4.7 4.5  CL 98 95*  CO2 24 22  GLUCOSE 119* 158*  BUN 34* 28*  CREATININE 5.20* 4.72*  CALCIUM 11.2* 11.1*  MG -- --  PHOS -- --   Liver Function Tests:  Lab 03/02/12 1500  AST 30  ALT 10  ALKPHOS 110  BILITOT 0.5  PROT 7.7  ALBUMIN 2.4*    Lab 03/02/12 1500  LIPASE 9*  AMYLASE --   No results found for this basename: AMMONIA:5 in the last 168 hours CBC:  Lab 03/03/12 0705 03/02/12 1500  WBC 16.4* 15.6*  NEUTROABS -- 13.7*  HGB 8.1* 8.0*  HCT 27.7* 27.9*  MCV 85.8 85.8  PLT 258 338   Cardiac Enzymes: No results found for this basename: CKTOTAL:5,CKMB:5,CKMBINDEX:5,TROPONINI:5 in the last 168 hours BNP (last 3 results)  Basename 03/02/12 1633 06/02/11 2034  PROBNP >70000.0* 31365.0*   CBG:  Lab 03/03/12 1119 03/03/12 0729 03/02/12 2143  GLUCAP 149* 108* 117*   No results found for this basename: HGBA1C:5 in the last 72  hours Other Labs: No components found with this basename: POCBNP:3 No results found for this basename: DDIMER:2 in the last 168 hours No results found for this basename: CHOL:2,HDL:2,LDLCALC:2,TRIG:2,CHOLHDL:2,LDLDIRECT:2 in the last 168 hours No results found for this basename: TSH,T4TOTAL,FREET3,T3FREE,FREET4,THYROIDAB in the last 168 hours No results found for this basename: VITAMINB12:2,FOLATE:2,FERRITIN:2,TIBC:2,IRON:2,RETICCTPCT:2 in the last 168 hours  Micro Results: Recent Results (from the past 240 hour(s))  MRSA PCR SCREENING     Status: Normal   Collection Time   02/24/12  4:59 AM      Component Value Range Status Comment   MRSA by PCR NEGATIVE  NEGATIVE Final   CULTURE, BLOOD (ROUTINE X 2)     Status: Normal   Collection Time   02/24/12  6:25 AM      Component Value Range Status Comment   Specimen Description BLOOD LEFT HAND   Final    Special Requests     Final    Value: BOTTLES DRAWN AEROBIC AND ANAEROBIC 10CC BLUE,5CC RED   Culture  Setup Time 02/24/2012 08:30   Final    Culture NO GROWTH 5 DAYS   Final    Report Status 03/01/2012 FINAL   Final   CULTURE, BLOOD (ROUTINE X 2)  Status: Normal   Collection Time   02/24/12  6:35 AM      Component Value Range Status Comment   Specimen Description BLOOD LEFT HAND   Final    Special Requests BOTTLES DRAWN AEROBIC ONLY 10CC   Final    Culture  Setup Time 02/24/2012 08:30   Final    Culture NO GROWTH 5 DAYS   Final    Report Status 03/01/2012 FINAL   Final     Studies/Results: Dg Chest 2 View  03/02/2012  *RADIOLOGY REPORT*  Clinical Data: Shortness of breath and abdominal pain  CHEST - 2 VIEW  Comparison: 02/23/2012  Findings: There is a left chest wall AICD with leads in the right atrial appendage, coronary sinus and right ventricle.  The patient is status post sternotomy and CABG procedure.  There is a right chest wall dialysis catheter with tips in the right atrium.  Stable cardiac enlargement.  No pleural effusion or  edema. Interval development of right upper lobe airspace opacity.  IMPRESSION:  1.  Right upper lobe airspace opacity concerning for pneumonia. 2.  Cardiac enlargement.   Original Report Authenticated By: Rosealee Albee, M.D.     Medications: I have reviewed the patient's current medications. Scheduled Meds:    . darbepoetin (ARANESP) injection - DIALYSIS  150 mcg Intravenous Q Fri-HD  . feeding supplement (NEPRO CARB STEADY)  237 mL Oral TID BM  . heparin  5,000 Units Subcutaneous Q8H  . HYDROmorphone  2 mg Oral TID  . insulin aspart  0-9 Units Subcutaneous TID WC  . iron dextran (INFED/DEXFERRUM) infusion  25 mg Intravenous Once  . iron dextran (INFED/DEXFERRUM) infusion  500 mg Intravenous Q Fri-HD  . levofloxacin  500 mg Oral Q48H  . methadone  5 mg Oral TID  . multivitamin  1 tablet Oral QHS  . ranolazine  500 mg Oral Daily  . sevelamer  800 mg Oral TID WC  . simvastatin  5 mg Oral QHS  . DISCONTD: ceFEPime (MAXIPIME) IV  1 g Intravenous Q24H  . DISCONTD: ferumoxytol  510 mg Intravenous Once  . DISCONTD: levofloxacin (LEVAQUIN) IV  500 mg Intravenous Q48H  . DISCONTD: sodium chloride  3 mL Intravenous Q12H  . DISCONTD: vancomycin  500 mg Intravenous Q M,W,F-HD   Continuous Infusions:  PRN Meds:.sodium chloride, sodium chloride, alteplase, feeding supplement (NEPRO CARB STEADY), heparin, heparin, HYDROmorphone (DILAUDID) injection, lidocaine, lidocaine-prilocaine, ondansetron (ZOFRAN) IV, oxyCODONE, pentafluoroprop-tetrafluoroeth, DISCONTD: sodium chloride, DISCONTD: sodium chloride  Assessment/Plan: Acute respiratory failure/shortness of breath secondary to healthcare associated pneumonia given recent hospitalization for about 2 days.  Chest x-ray 2 view on 03/02/2012 showed right upper lobe airspace opacity concerning for pneumonia. Discontinue vancomycin and cefepime. Continue levofloxacin q48h for 2 more doses to complete 7 day course of antibiotics. Minimize IV fluids given  history of chronic systolic heart failure. Antibiotics since 03/03/2012. Discussed with patient about following up with PCP in 1 week to get a followup chest x-ray for resolution of pneumonia.  Generalized weakness  Arrange for [facility] PT consultation, no OT needs seen.  Chronic abdominal pain  Likely secondary to chronic pancreatitis. Has had recent evaluation and workup. Has been evaluated by general surgery in early September of 2013 who recommended conservative treatment.  Patient reports pain is unchanged from about a month ago. Pain under better control with methadone and oral dilaudid. Do not see the need for further workup or evaluation at this time.   End stage renal disease  HD per  renal. Continue sevelamer. Last had dialysis on 03/02/2012.   History of ischemic cardiomyopathy/chronic systolic congestive heart failure with EF 20-25% based on ECHO on 10/07/2011  Patient appears compensated at this time. Continue to monitor. Continue Ranolazine.   Diabetes type 2  Hemoglobin A1c 5.7 on 10/04/2011. Stable. Sensitive SSI.   Hypertension  Stable. Not on any antihypertensive medications.   Hyperlipidemia  Continue statin.   Leukocytosis  Chronic. Likely due to pneumonia.   Anemia  Likely due to ESRD, s/p 1 unit of pRBC on 02/23/2012. Patient to receive IV iron with dialysis today.   History of fatty liver with Ascities  Patient gets paracentesis every 2 weeks (last paracentesis ~3 weeks ago). Currently no obvious paracentesis on exam.   Thrombocytopenia  Resolved at this time. Likely due to chronic liver disease.   Prophylaxis  SQ heparin.   Code status  She wishs to be have CPR or chest compressions done, but she does not wish to be on mechanical ventilation or life support (even temporarily).   Disposition  Discharge the patient today with home health PT.   LOS: 2 days  Zaylin Runco A, MD 03/04/2012, 7:59 AM

## 2012-03-04 NOTE — Progress Notes (Signed)
[facility] Progress Note    Subjective:   Patient states she feels well this morning. She states she continues to have increased swelling. Pt also states she is going home today, although she does not feel like she is ready.  Since we had offered her back to back treatments yesterday she is interested in that.  Seen on HD   Objective:   BP 128/82  Pulse 87  Temp 98.7 F (37.1 C) (Oral)  Resp 18  Ht 5' 4.5" (1.638 m)  Wt 135 lb (61.236 kg)  BMI 22.81 kg/m2  SpO2 96%  Intake/Output Summary (Last 24 hours) at 03/04/12 0739 Last data filed at 03/04/12 0528  Gross per 24 hour  Intake   1182 ml  Output      0 ml  Net   1182 ml   Weight change: 5 lb (2.268 kg)  Physical Exam: Gen:In HD, NAD. Awake and alert CVS: RRR, ICD left chest, permcath right chest Resp: Unlabored. CTAB Abd: Mildly tender, soft, nondistended. +BS Ext: Moves all extremities. 2-3+ pitting edema  Imaging: Dg Chest 2 View  03/02/2012  *RADIOLOGY REPORT*  Clinical Data: Shortness of breath and abdominal pain  CHEST - 2 VIEW  Comparison: 02/23/2012  Findings: There is a left chest wall AICD with leads in the right atrial appendage, coronary sinus and right ventricle.  The patient is status post sternotomy and CABG procedure.  There is a right chest wall dialysis catheter with tips in the right atrium.  Stable cardiac enlargement.  No pleural effusion or edema. Interval development of right upper lobe airspace opacity.  IMPRESSION:  1.  Right upper lobe airspace opacity concerning for pneumonia. 2.  Cardiac enlargement.   Original Report Authenticated By: Rosealee Albee, M.D.     Labs: BMET  Lab 03/03/12 0705 03/02/12 1500  NA 134* 133*  K 4.7 4.5  CL 98 95*  CO2 24 22  GLUCOSE 119* 158*  BUN 34* 28*  CREATININE 5.20* 4.72*  ALB -- --  CALCIUM 11.2* 11.1*  PHOS -- --   CBC  Lab 03/03/12 0705 03/02/12 1500  WBC 16.4* 15.6*  NEUTROABS -- 13.7*  HGB 8.1* 8.0*  HCT 27.7* 27.9*  MCV  85.8 85.8  PLT 258 338    Medications:      . ceFEPime (MAXIPIME) IV  1 g Intravenous Q24H  . darbepoetin (ARANESP) injection - DIALYSIS  150 mcg Intravenous Q Fri-HD  . feeding supplement (NEPRO CARB STEADY)  237 mL Oral TID BM  . heparin  5,000 Units Subcutaneous Q8H  . HYDROmorphone  2 mg Oral TID  . insulin aspart  0-9 Units Subcutaneous TID WC  . iron dextran (INFED/DEXFERRUM) infusion  25 mg Intravenous Once  . iron dextran (INFED/DEXFERRUM) infusion  500 mg Intravenous Q Fri-HD  . levofloxacin (LEVAQUIN) IV  500 mg Intravenous Q48H  . methadone  5 mg Oral TID  . multivitamin  1 tablet Oral QHS  . ranolazine  500 mg Oral Daily  . sevelamer  800 mg Oral TID WC  . simvastatin  5 mg Oral QHS  . DISCONTD: ferumoxytol  510 mg Intravenous Once  . DISCONTD: sodium chloride  3 mL Intravenous Q12H  . DISCONTD: vancomycin  500 mg Intravenous Q M,W,F-HD     Assessment/ Plan:   Pt is a 61 y.o. yo female with history of chronic abdominal pain, gallstone pancreatitis, coronary artery disease status post CABG, ischemic cardiomyopathy, end-stage renal disease on hemodialysis, diet controlled  diabetes, hypertension, hyperlipidemia, anemia, and CHF who was admitted for HCAP. Nephrology was consulted to resume hemodialysis during hospitalization   1. ESRD- Patient is very volume overloaded. Continue HD on MWF schedule with treatment, but may need multiple days in a row to get off maximal amount of fluid. Her access is right tunneled permcath. She does not have plans for permanent access at this time, but this was discussed at admission.   - Monitor strict I/O  - PTH elevated last week to 101. Calcium also elevated to 11.2. Continue Renvela.  - Daily labs (renal panel, CBC)  - HD today, and possibly again tomorrow- patient would prefer to stay and get extra treatment tomorrow, needs EDW decrease at discharge - Patient will resume OP dialysis in [place] at discharge  2. Anemia- HgB 8.1.  Appears she has a baseline of 8-9. She did receive one unit of blood last week. No obvious source of bleeding at this time. Patient states she used to give herself Aranesp when on peritoneal dialysis, but she is not sure if she is receiving anything now.  - Will start Aranesp, first dose today at HD - Iron last checked last week which showed low iron of 21, sat ratio of 14 (low) and elevated ferritin of 2657. Did not react to test dose of Infed, therefore will give dose today at HD  - Awaiting AM labs- lower today, getting blood- continue   3. PNA- Per primary team. Think it is good idea to switch to PO antibiotics only as soon as possible to decrease IVF.  - Still has O2 requirement - Continues to feels dyspneic   4. Nutrition- Chronically poor nutrition. Albumin 2.4 at admission  - Monitor PO intake  - Change diet to renal diet  - Will start on nutritional supplementation (Nepro) while here and encourage to continue at discharge  5. Dispo- HD today.   Amber M. Hairford, M.D. 03/04/2012, 7:39 AM   Patient seen and examined, agree with above note with above modifications.  61 year old BF with ESRD admit with PNA and volume overload.  Goal set today for 5 liters and will plan aggressive HD tomorrow as well.  Patient would prefer to stay and get extra treatment tomorrow and we can check hgb as well.   Annie Sable, MD 03/04/2012

## 2012-03-04 NOTE — Discharge Summary (Addendum)
Physician Discharge Summary  [name]:[id number] DOB: 1951-05-18 DOA: 03/02/2012  PCP: [name], MD  Admit date: 03/02/2012 Discharge date: 03/05/2012  Recommendations for Outpatient Follow-up:  1. Home health PT arranged at discharge. 2. Followup with PCP in 1 week for followup chest x-ray to monitor resolution of pneumonia. 3. Followup with your nephrologist next week to discuss need for long term access and management of anemia.   Discharge Diagnoses:  Principal Problem:  *HCAP (healthcare-associated pneumonia) Active Problems:  Ischemic cardiomyopathy  Chronic systolic congestive heart failure, NYHA class 3- EF 20-25%  LBBB (left bundle branch block)  Hypertension  CKD (chronic kidney disease) stage 5, GFR less than 15 ml/min  Pancreatic pseudocyst  Pancreatitis, chronic  Thrombocytopenia  Anemia in chronic kidney disease (CKD)  Leukocytosis  Esophageal varices in cirrhosis  Ascites  Acute respiratory failure with hypoxia  Fatigue  SOB (shortness of breath)   Discharge Condition: Stable  Diet recommendation: Diabetic diet  Filed Weights   03/02/12 2147 03/03/12 2055 03/04/12 0740  Weight: 59.739 kg (131 lb 11.2 oz) 61.236 kg (135 lb) 60.7 kg (133 lb 13.1 oz)    History of present illness:  [name] is a 61 y.o. African American female with history of chronic abdominal pain, gallstone pancreatitis, coronary artery disease status post CABG, ischemic cardiomyopathy, end-stage renal disease on hemodialysis, diet controlled diabetes, hypertension, hyperlipidemia, anemia,Chronic systolic heart failure with an ejection fraction of 20-25% based on echocardiogram on 10/07/2011, who presented on 03/02/2012 with shortness of breath and abdominal pain.  Hospital Course:  Acute respiratory failure/shortness of breath secondary to healthcare associated pneumonia given recent hospitalization for about 2 days.  Chest x-ray 2 view on 03/02/2012 showed right upper  lobe airspace opacity concerning for pneumonia. Initially was on vancomycin and cefepime, which was started due to concern for HCAP, these antibiotics were discontinued given improvement in patient's symptoms. Patient was also started on levofloxacin 500 q48h which she will continue for 2 more doses to complete 7 day course of antibiotics. Minimized IV fluids given history of chronic systolic heart failure. Antibiotics since 03/03/2012. Discussed with patient about following up with PCP in 1 week to get a followup chest x-ray for resolution of pneumonia.  Generalized weakness  Arrange for [facility] PT consultation, no OT needs seen.  Chronic abdominal pain  Likely secondary to chronic pancreatitis. Has had recent evaluation and workup. Has been evaluated by general surgery in early September of 2013 who recommended conservative treatment.  Patient reports pain is unchanged from about a month ago. Pain under better control with methadone and oral dilaudid. Do not see the need for further workup or evaluation at this time.   End stage renal disease  HD per renal. Continue sevelamer. Last had dialysis on 03/02/2012.   History of ischemic cardiomyopathy/chronic systolic congestive heart failure with EF 20-25% based on ECHO on 10/07/2011  Patient appears compensated at this time. Continue to monitor. Continue Ranolazine.   Diabetes type 2  Hemoglobin A1c 5.7 on 10/04/2011. Stable. Sensitive SSI.   Hypertension  Stable. Not on any antihypertensive medications.   Hyperlipidemia  Continue statin.   Leukocytosis  Chronic. Likely due to pneumonia.   Anemia  Likely due to ESRD, s/p 1 unit of pRBC on 02/23/2012. Patient received IV iron with dialysis on 03/04/2012 and 1 unit of pRBC on 03/04/2012 with dialysis.  History of fatty liver with Ascities  Patient gets paracentesis every 2 weeks (last paracentesis ~3 weeks ago). Currently no obvious paracentesis on  exam.   Thrombocytopenia  Resolved at this time.  Likely due to chronic liver disease.    Procedures:  HD  Consultations:  Renal  Discharge Exam: Filed Vitals:   03/03/12 1752 03/03/12 2055 03/04/12 0525 03/04/12 0740  BP: 138/90 135/98 128/82 127/81  Pulse: 91 96 87 84  Temp: 97.7 F (36.5 C) 97.6 F (36.4 C) 98.7 F (37.1 C) 97 F (36.1 C)  TempSrc: Oral Oral Oral Oral  Resp: 20 20 18 21   Height:  5' 4.5" (1.638 m)    Weight:  61.236 kg (135 lb)  60.7 kg (133 lb 13.1 oz)  SpO2: 97% 98% 96% 100%   Discharge Instructions  Discharge Orders    Future Orders Please Complete By Expires   Diet Carb Modified      Increase activity slowly      Discharge instructions      Comments:   Please followup with ARMOUR,ROSS, MD (PCP) in 1 week for repeat chest x-ray to determine resolution of pneumonia. Please followup with your nephrologist to discuss the need for long term access for dialysis and to discuss further management of your anemia.       Medication List     As of 03/04/2012  8:07 AM    TAKE these medications         darbepoetin 150 MCG/0.3ML Soln   Commonly known as: ARANESP   Inject 0.3 mLs (150 mcg total) into the vein every Friday with hemodialysis.      feeding supplement (NEPRO CARB STEADY) Liqd   Take 237 mLs by mouth 3 (three) times daily between meals.      HYDROmorphone 2 MG tablet   Commonly known as: DILAUDID   Take 1 tablet (2 mg total) by mouth 3 (three) times daily. Takes along with methadone.      levofloxacin 500 MG tablet   Commonly known as: LEVAQUIN   Take 1 tablet (500 mg total) by mouth every other day.      methadone 5 MG tablet   Commonly known as: DOLOPHINE   Take 1 tablet (5 mg total) by mouth 3 (three) times daily. Takes along with Dilaudid.      multivitamin Tabs tablet   Take 1 tablet by mouth daily.      oxycodone 5 MG capsule   Commonly known as: OXY-IR   Take 5 mg by mouth every 4 (four) hours as needed.      pravastatin 20 MG tablet   Commonly known as: PRAVACHOL    Take 20 mg by mouth at bedtime.      ranolazine 500 MG 12 hr tablet   Commonly known as: RANEXA   Take 500 mg by mouth daily.      RENAGEL 800 MG tablet   Generic drug: sevelamer   Take 800 mg by mouth 3 (three) times daily with meals.           Follow-up Information    Follow up with ARMOUR,ROSS, MD. Schedule an appointment as soon as possible for a visit today.          The results of significant diagnostics from this hospitalization (including imaging, microbiology, ancillary and laboratory) are listed below for reference.    Significant Diagnostic Studies: Dg Chest 2 View  03/02/2012  *RADIOLOGY REPORT*  Clinical Data: Shortness of breath and abdominal pain  CHEST - 2 VIEW  Comparison: 02/23/2012  Findings: There is a left chest wall AICD with leads in the right atrial appendage,  coronary sinus and right ventricle.  The patient is status post sternotomy and CABG procedure.  There is a right chest wall dialysis catheter with tips in the right atrium.  Stable cardiac enlargement.  No pleural effusion or edema. Interval development of right upper lobe airspace opacity.  IMPRESSION:  1.  Right upper lobe airspace opacity concerning for pneumonia. 2.  Cardiac enlargement.   Original Report Authenticated By: Rosealee Albee, M.D.    Dg Chest 2 View  02/23/2012  *RADIOLOGY REPORT*  Clinical Data: Short of breath.  Congestive heart failure.  On dialysis.  CHEST - 2 VIEW  Comparison: 10/06/2011  Findings: Improved aeration of both lungs is seen.  Mild improvement in bilateral airspace disease is seen consistent with improving pulmonary edema.  Cardiomegaly stable.  A new right jugular dual lumen center venous catheter is seen with tips within the right atrium.  No pneumothorax identified. AICD device remains in appropriate position.  Prior CABG again noted.  IMPRESSION: Improved aeration of both lungs and mild improvement in pulmonary edema since prior study.  Stable cardiomegaly.   Original  Report Authenticated By: Danae Orleans, M.D.    Ct Abdomen Pelvis W Contrast  02/24/2012  *RADIOLOGY REPORT*  Clinical Data: Lower extremity cramping.  Shortness of breath. Abdominal pain.  Nausea.  Dialysis patient.  CT ABDOMEN AND PELVIS WITH CONTRAST  Technique:  Multidetector CT imaging of the abdomen and pelvis was performed following the standard protocol during bolus administration of intravenous contrast.  Contrast: 80mL OMNIPAQUE IOHEXOL 300 MG/ML  SOLN  Comparison: 10/10/2011  Findings: Small right pleural effusion with infiltration or edema in the lung bases.  Diffuse cardiac enlargement.  Coronary artery calcifications with postoperative change in the mediastinum.  Surgical absence of the gallbladder.  Heterogeneous parenchymal enhancement throughout the liver may be due to early phase of contrast administration but infiltrative process such as cirrhosis or neoplasm is not excluded.  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  Diffuse upper abdominal fluid collection.  Densities are indeterminate, measuring 5-20 HU. This is probably related to ascites but hemorrhage, peritonitis, or proteinaceous fluid could have this appearance.  There is edema throughout the subcutaneous fat and mesenteric fat. The pancreas is poorly visualized due to surrounding fluid and edema.  The kidneys are atrophic with sub centimeter cysts.  No hydronephrosis.  There appear to be enlarged lymph nodes in the periaortic regions, stable since the previous study.  Calcification throughout the abdominal aorta and branch vessels.  Flow is demonstrated in the mesenteric artery.  The stomach is decompressed.  There are focal gas collections in the gastrosplenic ligament.  The previous study demonstrated perforation and given the presence of these gas collections, residual perforation is not excluded.   Abscess or infection is not excluded.  No small bowel dilatation.  Contrast material flows through to the colon.  Pelvis:  Pelvic fluid collections are demonstrated with low attenuation suggesting ascites.  The bladder is decompressed. Diverticula in the sigmoid colon without diverticulitis.  The appendix is not identified.  IMPRESSION: Small right pleural effusion with infiltration or edema in the lung bases.  Diffuse abdominal fluid collection in measurements. Changes are likely due to ascites but complex fluid collection, hemorrhage, or infection are not excluded.  Small gas bubbles in the gastrosplenic ligament may represent residual perforation or infection.  Abnormal hepatic parenchymal enhancement which could represent infiltrative  process or may be related to portal venous thrombosis.   Original Report Authenticated By: Marlon Pel, M.D.     Microbiology: Recent Results (from the past 240 hour(s))  MRSA PCR SCREENING     Status: Normal   Collection Time   02/24/12  4:59 AM      Component Value Range Status Comment   MRSA by PCR NEGATIVE  NEGATIVE Final   CULTURE, BLOOD (ROUTINE X 2)     Status: Normal   Collection Time   02/24/12  6:25 AM      Component Value Range Status Comment   Specimen Description BLOOD LEFT HAND   Final    Special Requests     Final    Value: BOTTLES DRAWN AEROBIC AND ANAEROBIC 10CC BLUE,5CC RED   Culture  Setup Time 02/24/2012 08:30   Final    Culture NO GROWTH 5 DAYS   Final    Report Status 03/01/2012 FINAL   Final   CULTURE, BLOOD (ROUTINE X 2)     Status: Normal   Collection Time   02/24/12  6:35 AM      Component Value Range Status Comment   Specimen Description BLOOD LEFT HAND   Final    Special Requests BOTTLES DRAWN AEROBIC ONLY 10CC   Final    Culture  Setup Time 02/24/2012 08:30   Final    Culture NO GROWTH 5 DAYS   Final    Report Status 03/01/2012 FINAL   Final      Labs: Basic Metabolic Panel:  Lab 03/03/12 6387 03/02/12 1500  NA  134* 133*  K 4.7 4.5  CL 98 95*  CO2 24 22  GLUCOSE 119* 158*  BUN 34* 28*  CREATININE 5.20* 4.72*  CALCIUM 11.2* 11.1*  MG -- --  PHOS -- --   Liver Function Tests:  Lab 03/02/12 1500  AST 30  ALT 10  ALKPHOS 110  BILITOT 0.5  PROT 7.7  ALBUMIN 2.4*    Lab 03/02/12 1500  LIPASE 9*  AMYLASE --   No results found for this basename: AMMONIA:5 in the last 168 hours CBC:  Lab 03/03/12 0705 03/02/12 1500  WBC 16.4* 15.6*  NEUTROABS -- 13.7*  HGB 8.1* 8.0*  HCT 27.7* 27.9*  MCV 85.8 85.8  PLT 258 338   Cardiac Enzymes: No results found for this basename: CKTOTAL:5,CKMB:5,CKMBINDEX:5,TROPONINI:5 in the last 168 hours BNP: BNP (last 3 results)  Basename 03/02/12 1633 06/02/11 2034  PROBNP >70000.0* 31365.0*   CBG:  Lab 03/03/12 1119 03/03/12 0729 03/02/12 2143  GLUCAP 149* 108* 117*    Time coordinating discharge: 25 minutes  Signed:  Sanae Willetts A  [facility] 03/05/2012, 9:58 AM

## 2012-03-04 NOTE — Progress Notes (Signed)
   CARE MANAGEMENT NOTE 03/04/2012  Patient:  [name], [name]   Account Number:  [id number]  Date Initiated:  03/03/2012  Documentation initiated by:  Johny Shock  Subjective/Objective Assessment:   Order for [facility] and [facility] of appropriate     Action/Plan:   Pt has [facility] for home health services.   Anticipated DC Date:  03/07/2012   Anticipated DC Plan:  HOME W HOME HEALTH SERVICES      DC Planning Services  CM consult      Choice offered to / List presented to:          [facility] arranged  HH-2 PT      [name] agency  [facility]   Status of service:  In process, will continue to follow Medicare Important Message given?   (If response is "NO", the following Medicare IM given date fields will be blank) Date Medicare IM given:   Date Additional Medicare IM given:    Discharge Disposition:    Per UR Regulation:  Reviewed for med. necessity/level of care/duration of stay  If discussed at Long Length of Stay Meetings, dates discussed:    Comments:  03/04/2012  [address] RN, [place]  [phone] Met with patient to discuss discharge planning.  Prior to admission she had home health services with [facility] and wishes to continue.  [facility] : [phone] called [name] to advise of hospital admission and plan to resume [facility] PT. FAX: [phone] order faxed for [facility] PT

## 2012-03-04 NOTE — Procedures (Signed)
Patient was seen on dialysis and the procedure was supervised.  BFR 350  Via PC BP is  128/74.   Patient appears to be tolerating treatment well  [name] A 03/04/2012

## 2012-03-05 ENCOUNTER — Inpatient Hospital Stay (HOSPITAL_COMMUNITY): Payer: Non-veteran care

## 2012-03-05 LAB — TYPE AND SCREEN
Donor AG Type: NEGATIVE
Unit division: 0

## 2012-03-05 LAB — CBC
MCH: 25.8 pg — ABNORMAL LOW (ref 26.0–34.0)
MCHC: 30.3 g/dL (ref 30.0–36.0)
MCV: 85.2 fL (ref 78.0–100.0)
Platelets: 209 10*3/uL (ref 150–400)
RDW: 19.2 % — ABNORMAL HIGH (ref 11.5–15.5)
WBC: 14 10*3/uL — ABNORMAL HIGH (ref 4.0–10.5)

## 2012-03-05 LAB — BASIC METABOLIC PANEL
Calcium: 10.5 mg/dL (ref 8.4–10.5)
Creatinine, Ser: 3.3 mg/dL — ABNORMAL HIGH (ref 0.50–1.10)
GFR calc Af Amer: 16 mL/min — ABNORMAL LOW (ref >90)
GFR calc non Af Amer: 14 mL/min — ABNORMAL LOW (ref >90)

## 2012-03-05 NOTE — Progress Notes (Addendum)
Subjective: Continue to feel better today after dialysis yesterday and after blood transfusion.   Objective: Vital signs in last 24 hours: Filed Vitals:   03/04/12 1509 03/04/12 1756 03/04/12 2130 03/05/12 0500  BP: 120/70 123/86 134/90 123/69  Pulse: 80 78 91 81  Temp: 97.3 F (36.3 C) 98.2 F (36.8 C) 97.7 F (36.5 C) 98.2 F (36.8 C)  TempSrc: Oral Oral Oral Oral  Resp:  20 20 19   Height:   5\' 4"  (1.626 m)   Weight:   56.881 kg (125 lb 6.4 oz)   SpO2: 94% 98% 94% 95%   Weight change: -0.536 kg (-1 lb 2.9 oz)  Intake/Output Summary (Last 24 hours) at 03/05/12 0834 Last data filed at 03/04/12 1933  Gross per 24 hour  Intake   1070 ml  Output   4000 ml  Net  -2930 ml    Physical Exam: General: Awake, Oriented, in some distress for abdominal pain.  HEENT: EOMI. Neck: Supple CV: S1 and S2 Lungs: Clear to ascultation bilaterally Abdomen: Soft, Nontender, Nondistended, +bowel sounds. Ext: Good pulses. 1-2+ LE edema.  Lab Results: Basic Metabolic Panel:  Lab 03/05/12 7829 03/04/12 0749 03/03/12 0705 03/02/12 1500  NA 134* 132* 134* 133*  K 3.5 4.9 4.7 4.5  CL 98 96 98 95*  CO2 25 25 24 22   GLUCOSE 73 94 119* 158*  BUN 20 45* 34* 28*  CREATININE 3.30* 5.74* 5.20* 4.72*  CALCIUM 10.5 11.2* 11.2* 11.1*  MG -- -- -- --  PHOS -- 5.5* -- --   Liver Function Tests:  Lab 03/04/12 0749 03/02/12 1500  AST 12 30  ALT 6 10  ALKPHOS 90 110  BILITOT 0.4 0.5  PROT 6.9 7.7  ALBUMIN 2.3* 2.4*    Lab 03/02/12 1500  LIPASE 9*  AMYLASE --   No results found for this basename: AMMONIA:5 in the last 168 hours CBC:  Lab 03/05/12 0444 03/04/12 0749 03/03/12 0705 03/02/12 1500  WBC 14.0* 16.5* 16.4* 15.6*  NEUTROABS -- -- -- 13.7*  HGB 8.2* 7.2* 8.1* 8.0*  HCT 27.1* 24.7* 27.7* 27.9*  MCV 85.2 85.8 85.8 85.8  PLT 209 255 258 338   Cardiac Enzymes: No results found for this basename: CKTOTAL:5,CKMB:5,CKMBINDEX:5,TROPONINI:5 in the last 168 hours BNP (last 3  results)  Basename 03/02/12 1633 06/02/11 2034  PROBNP >70000.0* 31365.0*   CBG:  Lab 03/05/12 0733 03/04/12 2124 03/04/12 1724 03/04/12 1247 03/03/12 2059  GLUCAP 88 154* 105* 89 76   No results found for this basename: HGBA1C:5 in the last 72 hours Other Labs: No components found with this basename: POCBNP:3 No results found for this basename: DDIMER:2 in the last 168 hours No results found for this basename: CHOL:2,HDL:2,LDLCALC:2,TRIG:2,CHOLHDL:2,LDLDIRECT:2 in the last 168 hours No results found for this basename: TSH,T4TOTAL,FREET3,T3FREE,FREET4,THYROIDAB in the last 168 hours No results found for this basename: VITAMINB12:2,FOLATE:2,FERRITIN:2,TIBC:2,IRON:2,RETICCTPCT:2 in the last 168 hours  Micro Results: No results found for this or any previous visit (from the past 240 hour(s)).  Studies/Results: No results found.  Medications: I have reviewed the patient's current medications. Scheduled Meds:    . darbepoetin (ARANESP) injection - DIALYSIS  150 mcg Intravenous Q Fri-HD  . feeding supplement (NEPRO CARB STEADY)  237 mL Oral TID BM  . heparin  5,000 Units Subcutaneous Q8H  . HYDROmorphone  2 mg Oral TID  . insulin aspart  0-9 Units Subcutaneous TID WC  . iron dextran (INFED/DEXFERRUM) infusion  500 mg Intravenous Q Fri-HD  . levofloxacin  500 mg Oral Q48H  . methadone  5 mg Oral TID  . multivitamin  1 tablet Oral QHS  . ranolazine  500 mg Oral Daily  . sevelamer  800 mg Oral TID WC  . simvastatin  5 mg Oral QHS   Continuous Infusions:  PRN Meds:.sodium chloride, sodium chloride, feeding supplement (NEPRO CARB STEADY), heparin, heparin, heparin, HYDROmorphone (DILAUDID) injection, lidocaine, lidocaine-prilocaine, ondansetron (ZOFRAN) IV, ondansetron, oxyCODONE, pentafluoroprop-tetrafluoroeth  Assessment/Plan: Acute respiratory failure/shortness of breath secondary to healthcare associated pneumonia given recent hospitalization for about 2 days.  Chest x-ray 2  view on 03/02/2012 showed right upper lobe airspace opacity concerning for pneumonia. Discontinue vancomycin and cefepime. Continue levofloxacin q48h for 2 more doses to complete 7 day course of antibiotics. Minimize IV fluids given history of chronic systolic heart failure. Antibiotics since 03/03/2012. Discussed with patient about following up with PCP in 1 week to get a followup chest x-ray for resolution of pneumonia.   Generalized weakness  Arrange for [facility] PT consultation, no OT needs seen.  Chronic abdominal pain  Likely secondary to chronic pancreatitis. Has had recent evaluation and workup. Has been evaluated by general surgery in early September of 2013 who recommended conservative treatment.  Patient reports pain is unchanged from about a month ago. Pain under better control with methadone and oral dilaudid. Do not see the need for further workup or evaluation at this time.   End stage renal disease  HD per renal. Continue sevelamer. Last had dialysis on 03/02/2012.   History of ischemic cardiomyopathy/chronic systolic congestive heart failure with EF 20-25% based on ECHO on 10/07/2011  Patient appears compensated at this time. Continue to monitor. Continue Ranolazine.   Diabetes type 2  Hemoglobin A1c 5.7 on 10/04/2011. Stable. Sensitive SSI.   Hypertension  Stable. Not on any antihypertensive medications.   Hyperlipidemia  Continue statin.   Leukocytosis  Chronic. Likely due to pneumonia.   Anemia  Likely due to ESRD, s/p 1 unit of pRBC on 02/23/2012. Patient received IV iron with dialysis on 03/04/2012 and 1 unit of pRBC on 03/04/2012 with dialysis.  History of fatty liver with Ascities  Patient gets paracentesis every 2 weeks (last paracentesis ~3 weeks ago). Currently no obvious paracentesis on exam.   Thrombocytopenia  Resolved at this time. Likely due to chronic liver disease.   Prophylaxis  SQ heparin.   Code status  She wishs to be have CPR or chest compressions  done, but she does not wish to be on mechanical ventilation or life support (even temporarily).   Disposition  Discharge the patient today with home health PT. Discharge delayed yesterday as the patient was scheduled for another dialysis today.   LOS: 3 days  Wymon Swaney A, MD 03/05/2012, 8:34 AM

## 2012-03-05 NOTE — Progress Notes (Signed)
[facility] Progress Note    Subjective:   Patient states she feels much improved after HD treatment yesterday. Was fatigued after HD, but now feels great. States she feels more energetic and her edema is slightly less today. She is looking forward to second treatment today, but she states her son is coming from [place] today at discharge.  Plan is for him to go home.    Objective:   BP 123/69  Pulse 81  Temp 98.2 F (36.8 C) (Oral)  Resp 19  Ht 5\' 4"  (1.626 m)  Wt 125 lb 6.4 oz (56.881 kg)  BMI 21.52 kg/m2  SpO2 95%  Intake/Output Summary (Last 24 hours) at 03/05/12 0727 Last data filed at 03/04/12 1933  Gross per 24 hour  Intake   1070 ml  Output   4000 ml  Net  -2930 ml   Weight change: -1 lb 2.9 oz (-0.536 kg)  Physical Exam: Gen: Sitting on edge of bed. Awake and alert, NAD CVS: RRR, ICD left chest, permcath right chest Resp: Unlabored. CTAB Abd: Mildly tender, soft, nondistended. +BS Ext: Moves all extremities. 2-3+ pitting edema  Imaging: No results found.  Labs: BMET  Lab 03/05/12 0444 03/04/12 0749 03/03/12 0705 03/02/12 1500  NA 134* 132* 134* 133*  K 3.5 4.9 4.7 4.5  CL 98 96 98 95*  CO2 25 25 24 22   GLUCOSE 73 94 119* 158*  BUN 20 45* 34* 28*  CREATININE 3.30* 5.74* 5.20* 4.72*  ALB -- -- -- --  CALCIUM 10.5 11.2* 11.2* 11.1*  PHOS -- 5.5* -- --   CBC  Lab 03/05/12 0444 03/04/12 0749 03/03/12 0705 03/02/12 1500  WBC 14.0* 16.5* 16.4* 15.6*  NEUTROABS -- -- -- 13.7*  HGB 8.2* 7.2* 8.1* 8.0*  HCT 27.1* 24.7* 27.7* 27.9*  MCV 85.2 85.8 85.8 85.8  PLT 209 255 258 338    Medications:      . darbepoetin (ARANESP) injection - DIALYSIS  150 mcg Intravenous Q Fri-HD  . feeding supplement (NEPRO CARB STEADY)  237 mL Oral TID BM  . heparin  5,000 Units Subcutaneous Q8H  . HYDROmorphone  2 mg Oral TID  . insulin aspart  0-9 Units Subcutaneous TID WC  . iron dextran (INFED/DEXFERRUM) infusion  500 mg Intravenous Q Fri-HD  .  levofloxacin  500 mg Oral Q48H  . methadone  5 mg Oral TID  . multivitamin  1 tablet Oral QHS  . ranolazine  500 mg Oral Daily  . sevelamer  800 mg Oral TID WC  . simvastatin  5 mg Oral QHS  . DISCONTD: ceFEPime (MAXIPIME) IV  1 g Intravenous Q24H  . DISCONTD: levofloxacin (LEVAQUIN) IV  500 mg Intravenous Q48H    Assessment/ Plan:   Pt is a 61 y.o. yo female with history of chronic abdominal pain, gallstone pancreatitis, coronary artery disease status post CABG, ischemic cardiomyopathy, end-stage renal disease on hemodialysis, diet controlled diabetes, hypertension, hyperlipidemia, anemia, and CHF who was admitted for HCAP. Nephrology was consulted to resume hemodialysis during hospitalization   1. ESRD- Patient is very volume overloaded. Continue HD on MWF schedule with treatment, but may need multiple days in a row to get off maximal amount of fluid. Her access is right tunneled permcath. She does not have plans for permanent access at this time, but this was discussed at admission.  Removed 4L of fluid on 9/20 and plans for second aggressive treatment today. - Monitor strict I/O  - PTH elevated last week  to 101. Calcium also elevated to 11.2. Continue Renvela.  - Daily labs (renal panel, CBC)  - HD today for her second day in a row - Patient will resume OP dialysis in [place] at discharge. She will be going to [place] with her son today, but states she is coming back Monday for treatment. Will contact home unit at discharge with her decreased EDW  2. Anemia- Appears she has a baseline of 8-9. She did receive one unit of blood last week and another unit on 9/20 with HD. No obvious source of bleeding at this time. Patient states she used to give herself Aranesp when on peritoneal dialysis, but she is not sure if she is receiving anything now.  - Aranesp given at HD yesterday - Iron last checked last week which showed low iron of 21, sat ratio of 14 (low) and elevated ferritin of 2657. Did  not react to test dose of Infed and received full dose with HD yesterday - Also given 1 unti PRBC yesterday. HgB today 8.2. Continue to monitor as an outpatient  3. PNA- Per primary team. Now on PO abx - Continues to feels dyspneic but overall improved  4. Nutrition- Chronically poor nutrition. Albumin 2.4 at admission, which is up from prior admission  - Monitor PO intake  - Change diet to renal diet  - Will start on nutritional supplementation (Nepro) while here and encourage to continue at discharge  5. Dispo- HD second treatment today then OK to d/c from our standpoint to continue her MWF schedule in [facility]. Will need lower estimated dry weight at discharge.   Amber M. Hairford, M.D. 03/05/2012, 7:27 AM   Patient seen and examined, agree with above note with above modifications. 61 year old BF ESRD admitted with maybe PNA and for sure volume overload.  HD 2 days in row to determine new EDW.  To go home today Annie Sable, MD 03/05/2012

## 2012-03-05 NOTE — Procedures (Signed)
Patient was seen on dialysis and the procedure was supervised.  BFR 400  Via PC BP is  117/78.   Patient appears to be tolerating treatment well  [name] A 03/05/2012

## 2012-03-15 HISTORY — PX: ARTERIOVENOUS GRAFT PLACEMENT: SUR1029

## 2012-03-22 DIAGNOSIS — D729 Disorder of white blood cells, unspecified: Secondary | ICD-10-CM | POA: Insufficient documentation

## 2012-04-01 ENCOUNTER — Observation Stay (HOSPITAL_COMMUNITY): Payer: Non-veteran care

## 2012-04-01 ENCOUNTER — Emergency Department (HOSPITAL_COMMUNITY): Payer: Non-veteran care

## 2012-04-01 ENCOUNTER — Encounter (HOSPITAL_COMMUNITY): Payer: Self-pay | Admitting: Emergency Medicine

## 2012-04-01 ENCOUNTER — Inpatient Hospital Stay (HOSPITAL_COMMUNITY)
Admission: EM | Admit: 2012-04-01 | Discharge: 2012-04-05 | DRG: 640 | Disposition: A | Discharge status: Home Health | Payer: Non-veteran care | Attending: Internal Medicine | Admitting: Internal Medicine

## 2012-04-01 DIAGNOSIS — A4159 Other Gram-negative sepsis: Secondary | ICD-10-CM

## 2012-04-01 DIAGNOSIS — I447 Left bundle-branch block, unspecified: Secondary | ICD-10-CM

## 2012-04-01 DIAGNOSIS — R1013 Epigastric pain: Secondary | ICD-10-CM

## 2012-04-01 DIAGNOSIS — I472 Ventricular tachycardia, unspecified: Secondary | ICD-10-CM | POA: Diagnosis present

## 2012-04-01 DIAGNOSIS — I120 Hypertensive chronic kidney disease with stage 5 chronic kidney disease or end stage renal disease: Secondary | ICD-10-CM | POA: Diagnosis present

## 2012-04-01 DIAGNOSIS — Z950 Presence of cardiac pacemaker: Secondary | ICD-10-CM

## 2012-04-01 DIAGNOSIS — D72829 Elevated white blood cell count, unspecified: Secondary | ICD-10-CM

## 2012-04-01 DIAGNOSIS — Z9119 Patient's noncompliance with other medical treatment and regimen: Secondary | ICD-10-CM

## 2012-04-01 DIAGNOSIS — Z8249 Family history of ischemic heart disease and other diseases of the circulatory system: Secondary | ICD-10-CM

## 2012-04-01 DIAGNOSIS — R188 Other ascites: Secondary | ICD-10-CM

## 2012-04-01 DIAGNOSIS — D631 Anemia in chronic kidney disease: Secondary | ICD-10-CM

## 2012-04-01 DIAGNOSIS — E785 Hyperlipidemia, unspecified: Secondary | ICD-10-CM | POA: Diagnosis present

## 2012-04-01 DIAGNOSIS — Z9115 Patient's noncompliance with renal dialysis: Secondary | ICD-10-CM

## 2012-04-01 DIAGNOSIS — I8510 Secondary esophageal varices without bleeding: Secondary | ICD-10-CM

## 2012-04-01 DIAGNOSIS — I255 Ischemic cardiomyopathy: Secondary | ICD-10-CM

## 2012-04-01 DIAGNOSIS — Z87891 Personal history of nicotine dependence: Secondary | ICD-10-CM

## 2012-04-01 DIAGNOSIS — Z888 Allergy status to other drugs, medicaments and biological substances status: Secondary | ICD-10-CM

## 2012-04-01 DIAGNOSIS — E875 Hyperkalemia: Principal | ICD-10-CM

## 2012-04-01 DIAGNOSIS — Z951 Presence of aortocoronary bypass graft: Secondary | ICD-10-CM

## 2012-04-01 DIAGNOSIS — K863 Pseudocyst of pancreas: Secondary | ICD-10-CM

## 2012-04-01 DIAGNOSIS — I2589 Other forms of chronic ischemic heart disease: Secondary | ICD-10-CM | POA: Diagnosis present

## 2012-04-01 DIAGNOSIS — K631 Perforation of intestine (nontraumatic): Secondary | ICD-10-CM

## 2012-04-01 DIAGNOSIS — Z9089 Acquired absence of other organs: Secondary | ICD-10-CM

## 2012-04-01 DIAGNOSIS — Z79899 Other long term (current) drug therapy: Secondary | ICD-10-CM

## 2012-04-01 DIAGNOSIS — I10 Essential (primary) hypertension: Secondary | ICD-10-CM | POA: Diagnosis present

## 2012-04-01 DIAGNOSIS — J9601 Acute respiratory failure with hypoxia: Secondary | ICD-10-CM

## 2012-04-01 DIAGNOSIS — Z885 Allergy status to narcotic agent status: Secondary | ICD-10-CM

## 2012-04-01 DIAGNOSIS — I5022 Chronic systolic (congestive) heart failure: Secondary | ICD-10-CM

## 2012-04-01 DIAGNOSIS — N039 Chronic nephritic syndrome with unspecified morphologic changes: Secondary | ICD-10-CM | POA: Diagnosis present

## 2012-04-01 DIAGNOSIS — N19 Unspecified kidney failure: Secondary | ICD-10-CM

## 2012-04-01 DIAGNOSIS — Z91199 Patient's noncompliance with other medical treatment and regimen due to unspecified reason: Secondary | ICD-10-CM

## 2012-04-01 DIAGNOSIS — R5381 Other malaise: Secondary | ICD-10-CM | POA: Diagnosis present

## 2012-04-01 DIAGNOSIS — B49 Unspecified mycosis: Secondary | ICD-10-CM

## 2012-04-01 DIAGNOSIS — R5383 Other fatigue: Secondary | ICD-10-CM

## 2012-04-01 DIAGNOSIS — N186 End stage renal disease: Secondary | ICD-10-CM | POA: Diagnosis present

## 2012-04-01 DIAGNOSIS — R0602 Shortness of breath: Secondary | ICD-10-CM

## 2012-04-01 DIAGNOSIS — J189 Pneumonia, unspecified organism: Secondary | ICD-10-CM

## 2012-04-01 DIAGNOSIS — B9561 Methicillin susceptible Staphylococcus aureus infection as the cause of diseases classified elsewhere: Secondary | ICD-10-CM

## 2012-04-01 DIAGNOSIS — R7881 Bacteremia: Secondary | ICD-10-CM

## 2012-04-01 DIAGNOSIS — N185 Chronic kidney disease, stage 5: Secondary | ICD-10-CM

## 2012-04-01 DIAGNOSIS — K861 Other chronic pancreatitis: Secondary | ICD-10-CM

## 2012-04-01 DIAGNOSIS — Z90710 Acquired absence of both cervix and uterus: Secondary | ICD-10-CM

## 2012-04-01 DIAGNOSIS — D696 Thrombocytopenia, unspecified: Secondary | ICD-10-CM

## 2012-04-01 DIAGNOSIS — N2581 Secondary hyperparathyroidism of renal origin: Secondary | ICD-10-CM | POA: Diagnosis present

## 2012-04-01 DIAGNOSIS — Z91158 Patient's noncompliance with renal dialysis for other reason: Secondary | ICD-10-CM

## 2012-04-01 DIAGNOSIS — I2510 Atherosclerotic heart disease of native coronary artery without angina pectoris: Secondary | ICD-10-CM | POA: Diagnosis present

## 2012-04-01 DIAGNOSIS — I4729 Other ventricular tachycardia: Secondary | ICD-10-CM | POA: Diagnosis present

## 2012-04-01 DIAGNOSIS — I81 Portal vein thrombosis: Secondary | ICD-10-CM

## 2012-04-01 DIAGNOSIS — E119 Type 2 diabetes mellitus without complications: Secondary | ICD-10-CM | POA: Diagnosis present

## 2012-04-01 DIAGNOSIS — I509 Heart failure, unspecified: Secondary | ICD-10-CM | POA: Diagnosis present

## 2012-04-01 LAB — COMPREHENSIVE METABOLIC PANEL
AST: 17 U/L (ref 0–37)
BUN: 54 mg/dL — ABNORMAL HIGH (ref 6–23)
CO2: 21 mEq/L (ref 19–32)
Calcium: 10.4 mg/dL (ref 8.4–10.5)
Creatinine, Ser: 8.23 mg/dL — ABNORMAL HIGH (ref 0.50–1.10)
GFR calc non Af Amer: 5 mL/min — ABNORMAL LOW (ref >90)
Total Bilirubin: 0.5 mg/dL (ref 0.3–1.2)

## 2012-04-01 LAB — CBC WITH DIFFERENTIAL/PLATELET
Basophils Absolute: 0 10*3/uL (ref 0.0–0.1)
Basophils Relative: 0 % (ref 0–1)
Eosinophils Relative: 1 % (ref 0–5)
HCT: 30 % — ABNORMAL LOW (ref 36.0–46.0)
Hemoglobin: 8.9 g/dL — ABNORMAL LOW (ref 12.0–15.0)
MCHC: 29.7 g/dL — ABNORMAL LOW (ref 30.0–36.0)
MCV: 85.7 fL (ref 78.0–100.0)
Monocytes Absolute: 0.5 10*3/uL (ref 0.1–1.0)
Monocytes Relative: 4 % (ref 3–12)
RDW: 18.8 % — ABNORMAL HIGH (ref 11.5–15.5)

## 2012-04-01 LAB — POTASSIUM: Potassium: 6.4 mEq/L (ref 3.5–5.1)

## 2012-04-01 LAB — LIPASE, BLOOD: Lipase: 11 U/L (ref 11–59)

## 2012-04-01 MED ORDER — ONDANSETRON HCL 4 MG/2ML IJ SOLN
4.0000 mg | Freq: Once | INTRAMUSCULAR | Status: AC
  Administered 2012-04-01: 4 mg via INTRAVENOUS
  Filled 2012-04-01: qty 2

## 2012-04-01 MED ORDER — HYDROMORPHONE HCL PF 1 MG/ML IJ SOLN
0.5000 mg | Freq: Once | INTRAMUSCULAR | Status: AC
  Administered 2012-04-01: 0.5 mg via INTRAVENOUS
  Filled 2012-04-01: qty 1

## 2012-04-01 MED ORDER — SODIUM CHLORIDE 0.9 % IV SOLN: Freq: Once | INTRAVENOUS | Status: DC

## 2012-04-01 MED ORDER — SODIUM CHLORIDE 0.9 % IV SOLN
Freq: Once | INTRAVENOUS | Status: AC
  Administered 2012-04-01: 125 mL/h via INTRAVENOUS

## 2012-04-01 MED ORDER — HYDROMORPHONE HCL PF 1 MG/ML IJ SOLN
0.5000 mg | INTRAMUSCULAR | Status: AC | PRN
  Administered 2012-04-02 (×3): 0.5 mg via INTRAVENOUS
  Filled 2012-04-01: qty 1

## 2012-04-01 MED ORDER — SODIUM CHLORIDE 0.9 % IV SOLN: INTRAVENOUS | Status: AC

## 2012-04-01 NOTE — ED Notes (Signed)
Pt requesting something for pain. PA Shari notified.

## 2012-04-01 NOTE — ED Notes (Addendum)
critical  Lab report  k+ 6.6

## 2012-04-01 NOTE — ED Notes (Signed)
Patient denies pain and is resting comfortably.  

## 2012-04-01 NOTE — Progress Notes (Signed)
CRITICAL VALUE ALERT  Critical value received:  Potassium 6.4  Date of notification:  04/01/2012  Time of notification:  2003  Critical value read back:yes  Nurse who received alert:  Hezzie Bump, RN  MD notified (1st page):  Langley Adie, PA  Time of first page:  2003  MD notified (2nd page):  Time of second page:  Responding MD: Langley Adie, PA  Time MD responded:  2003

## 2012-04-01 NOTE — Consult Note (Signed)
Referring Provider: No ref. provider found Primary Care Physician:  [name], MD Primary Nephrologist:  Dr. [name]  Reason for Consultation: Hyperkalemia HPI:   New Dialysis catheter since September. Persistent nausea left early Wednesday. Missed dialysis today as patient could not walk. Visited [facility] for vascular access. Not candidate for renal transplant due to cardiomyopathy EF 20 %. Loosing weight 100lbs in about a year. Bowel movements normal. No bed sores. No fevers. Lives alone  Past Medical History  Diagnosis Date  . Gallstone pancreatitis   . Coronary artery disease     s/p CABG 2008 with multiple PCIs  . Ischemic cardiomyopathy   . End stage renal disease   . Diabetes mellitus   . Hypertension   . History of nonadherence to medical treatment   . LBBB (left bundle branch block)   . Hyperlipidemia   . Renal insufficiency   . CHF (congestive heart failure)   . Dialysis patient   . Anemia     Past Surgical History  Procedure Date  . Coronary artery bypass graft 2008  . Cholecystectomy   . Abdominal hysterectomy   . Tubal ligation   . Tonsillectomy   . Insert / replace / remove pacemaker     pacemaker ICD  . Esophagogastroduodenoscopy 09/15/2011    Procedure: ESOPHAGOGASTRODUODENOSCOPY (EGD);  Surgeon: Theda Belfast, MD;  Location: [facility] ENDOSCOPY;  Service: Endoscopy;  Laterality: N/A;    Prior to Admission medications   Medication Sig Start Date End Date Taking? Authorizing Provider  multivitamin (RENA-VIT) TABS tablet Take 1 tablet by mouth daily. 10/14/11  Yes Cl[place] Cyndie Mull, MD  pravastatin (PRAVACHOL) 20 MG tablet Take 20 mg by mouth at bedtime.    Yes Historical Provider, MD  ranolazine (RANEXA) 500 MG 12 hr tablet Take 500 mg by mouth daily.     Yes Historical Provider, MD  sevelamer (RENAGEL) 800 MG tablet Take 800 mg by mouth 3 (three) times daily with meals.    Yes Historical Provider, MD  HYDROmorphone (DILAUDID) 2 MG tablet Take 1 tablet (2 mg total) by  mouth 3 (three) times daily. Takes along with methadone. 03/04/12   [name], MD  methadone (DOLOPHINE) 5 MG tablet Take 1 tablet (5 mg total) by mouth 3 (three) times daily. Takes along with Dilaudid. 03/04/12   Srikar Cherlynn Kaiser, MD  Nutritional Supplements (FEEDING SUPPLEMENT, NEPRO CARB STEADY,) LIQD Take 237 mLs by mouth 3 (three) times daily between meals. 03/04/12   Srikar Cherlynn Kaiser, MD  pantoprazole (PROTONIX) 40 MG tablet Take 40 mg by mouth daily.    Historical Provider, MD    No current facility-administered medications for this encounter.   Current Outpatient Prescriptions  Medication Sig Dispense Refill  . multivitamin (RENA-VIT) TABS tablet Take 1 tablet by mouth daily.      . pravastatin (PRAVACHOL) 20 MG tablet Take 20 mg by mouth at bedtime.       . ranolazine (RANEXA) 500 MG 12 hr tablet Take 500 mg by mouth daily.        . sevelamer (RENAGEL) 800 MG tablet Take 800 mg by mouth 3 (three) times daily with meals.       [name] HYDROmorphone (DILAUDID) 2 MG tablet Take 1 tablet (2 mg total) by mouth 3 (three) times daily. Takes along with methadone.  21 tablet  0  . methadone (DOLOPHINE) 5 MG tablet Take 1 tablet (5 mg total) by mouth 3 (three) times daily. Takes along with Dilaudid.  21 tablet  0  . Nutritional Supplements (FEEDING SUPPLEMENT, NEPRO CARB STEADY,) LIQD Take 237 mLs by mouth 3 (three) times daily between meals.      . pantoprazole (PROTONIX) 40 MG tablet Take 40 mg by mouth daily.       Facility-Administered Medications Ordered in Other Encounters  Medication Dose Route Frequency Provider Last Rate Last Dose  . 0.9 %  sodium chloride infusion   Intravenous Once Rodena Medin, PA-C 125 mL/hr at 04/01/12 1704 125 mL/hr at 04/01/12 1704  . 0.9 %  sodium chloride infusion   Intravenous Once Rodena Medin, PA-C 75 mL/hr at 04/01/12 1722 75 mL/hr at 04/01/12 1722  . 0.9 %  sodium chloride infusion   Intravenous STAT Rodena Medin, PA-C      . HYDROmorphone (DILAUDID)  injection 0.5 mg  0.5 mg Intravenous Once Rodena Medin, PA-C   0.5 mg at 04/01/12 1815  . HYDROmorphone (DILAUDID) injection 0.5 mg  0.5 mg Intravenous Once Rodena Medin, PA-C   0.5 mg at 04/01/12 2131  . HYDROmorphone (DILAUDID) injection 0.5 mg  0.5 mg Intravenous Q2H PRN Rodena Medin, PA-C      . ondansetron (ZOFRAN) injection 4 mg  4 mg Intravenous Once Rodena Medin, PA-C   4 mg at 04/01/12 1814    Allergies as of 03/02/2012 - Review Complete 03/02/2012  Allergen Reaction Noted  . Morphine and related Anaphylaxis 12/01/2010  . Codeine Itching 12/01/2010  . Lisinopril Other (See Comments) 12/01/2010    Family History  Problem Relation Age of Onset  . Coronary artery disease Mother   . Hypertension Mother   . Diabetes Mother   . Diabetes Sister   . Anesthesia problems Neg Hx     History   Social History  . Marital Status: Single    Spouse Name: N/A    Number of Children: N/A  . Years of Education: N/A   Occupational History  . Not on file.   Social History Main Topics  . Smoking status: Former Smoker    Quit date: 06/15/2000  . Smokeless tobacco: Not on file  . Alcohol Use: No  . Drug Use: No  . Sexually Active: Not on file   Other Topics Concern  . Not on file   Social History Narrative   The patient lives alone in [place].  She is a retired nurse-Psychiatric and ortho.  She is disabled and receives most of her care from the [place], [place].  Son-shawn-[phone] alone    Review of Systems: Gen: ++, anorexia, fatigue, weakness, malaise, weight loss, and sleep disorder weight loss HEENT: Wears glasses, dentures ill fitting CV: Denies chest pain, angina, palpitations, syncope, orthopnea, PND, peripheral edema, and claudication. Resp: Complains of intermittent dyspnea, Waking up and orthopnea. Comes in below dry weight GI: Nausea and vomiting bile worse in dialysis for the last month history of pancreatitis GU : continues with urine  output MS: Denies joint pain, limitation of movement, and swelling, stiffness, low back pain, extremity pain. Denies muscle weakness, cramps, atrophy.  No use of non steroidal antiinflammatory drugs. Derm: Denies rash, itching, dry skin, hives, moles, warts, or unhealing ulcers.  Psych: Denies depression, anxiety, memory loss, suicidal ideation, hallucinations, paranoia, and confusion. Heme: Denies bruising, bleeding, and enlarged lymph nodes. Neuro: No headache.  No diplopia. No dysarthria.  No dysphasia.  No history of CVA.  No Seizures. No paresthesias.  No weakness. Endocrine No DM.  No Thyroid disease.  No Adrenal disease.  Physical Exam: Vital signs in last 24 hours:   Last BM Date: 03/02/12 General:  Chronically Ill Head:  Normocephalic and atraumatic. Eyes:  Sclera clear, no icterus.   Conjunctiva pale Ears:  Normal auditory acuity. Nose:  No deformity, discharge,  or lesions. Mouth:  No deformity or lesions, dentition normal. Neck:  Supple; no masses or thyromegaly. JVP not elevated Lungs:  Poor air entry and diminished at bases Heart:  Faint systolic Abdomen:  Soft, nontender and nondistended. No masses, hepatosplenomegaly or hernias noted. Normal bowel sounds, without guarding, and without rebound.   Msk:  Symmetrical without gross deformities. Normal posture. Pulses:  No carotid, renal, femoral bruits. DP and PT symmetrical and equal Extremities:  Without clubbing or edema. Neurologic:  Alert and  oriented x4;  grossly normal neurologically. Skin:  Intact without significant lesions or rashes. Cervical Nodes:  No significant cervical adenopathy. Psych:  Alert and cooperative. Normal mood and affect.  Intake/Output from previous day:   Intake/Output this shift:    Lab Results:  Basename 04/01/12 1630  WBC 11.6*  HGB 8.9*  HCT 30.0*  PLT 286   BMET  Basename 04/01/12 1849 04/01/12 1630  NA -- 131*  K 6.4* 6.6*  CL -- 93*  CO2 -- 21  GLUCOSE -- 89  BUN --  54*  CREATININE -- 8.23*  CALCIUM -- 10.4  PHOS -- --   LFT  Basename 04/01/12 1630  PROT 7.8  ALBUMIN 2.5*  AST 17  ALT 8  ALKPHOS 119*  BILITOT 0.5  BILIDIR --  IBILI --   PT/INR No results found for this basename: LABPROT:2,INR:2 in the last 72 hours Hepatitis Panel No results found for this basename: HEPBSAG,HCVAB,HEPAIGM,HEPBIGM in the last 72 hours  Studies/Results: Dg Abd Acute W/chest  04/01/2012  *RADIOLOGY REPORT*  Clinical Data: Shortness of breath, cough, chronic pancreatitis  ACUTE ABDOMEN SERIES (ABDOMEN 2 VIEW & CHEST 1 VIEW)  Comparison: CT 02/23/2012, chest radiograph 02/23/2012  Findings: Left AICD in place.  Evidence of CABG.  Moderate enlargement of the cardiomediastinal silhouette is stable.  Central vascular congestion without overt edema.  Trace pleural effusions. No free air beneath the diaphragms.  The most inferior sternal wire is again noted to be discontinuous.  Cholecystectomy clips noted.  Vascular calcifications are noted. Normal visualized bowel gas pattern.  Left upper quadrant anastomotic chain sutures are noted.  No acute osseous abnormality.  IMPRESSION: Normal bowel gas pattern.  Trace bilateral pleural effusions and cardiomegaly.   Original Report Authenticated By: Harrel Lemon, M.D.     Assessment/Plan: Evaluation for management of ESRD. Vitals are stable with no distress although has lost weight and has lower extremity edema.she receives dialysis MWF at [facility].She is dialyzed for 3 hrs on a 180 non-reuse dialyzer with a 2.5 calcium and 2 potassium dialysate and linear sodium 138.She receives erythropoietin and iron to keep his Hb above 10 and IV Vitamin D and phosphate binders to keep his Bone mineral goals at [facility].She has a permcath that was placed in September. We shall proceed with emergent  in hospital scheduled dialysis  with 1 potassium bath due to hyperkalemia and run 4 hours tonight. We shall also challenge  her weight due to her edema   LOS: 3 Arlee Bossard W @TODAY @10 :24 PM

## 2012-04-01 NOTE — ED Notes (Signed)
Patient is resting comfortably. 

## 2012-04-01 NOTE — ED Notes (Signed)
Pt arrives to ed via [facility] c/o "weakness" in lower extremities. Onset yesterday.  Pt uses walker at home.  Nad. Caox4. pmsx4.

## 2012-04-01 NOTE — Consult Note (Signed)
Dorothy Buddy, MD Physician Signed Nephrology Consult Note 04/01/2012 10:24 PM  Referring Provider: No ref. provider found Primary Care Physician:  [name], MD Primary Nephrologist:  Dr. [name]   Reason for Consultation: Hyperkalemia   HPI:   New Dialysis catheter since September. Persistent nausea left early Wednesday. Missed dialysis today as patient could not walk. Visited [facility] for vascular access. Not candidate for renal transplant due to cardiomyopathy EF 20 %. Loosing weight 100lbs in about a year. Bowel movements normal. No bed sores. No fevers. Lives alone    Past Medical History   Diagnosis  Date   .  Gallstone pancreatitis     .  Coronary artery disease         s/p CABG 2008 with multiple PCIs   .  Ischemic cardiomyopathy     .  End stage renal disease     .  Diabetes mellitus     .  Hypertension     .  History of nonadherence to medical treatment     .  LBBB (left bundle branch block)     .  Hyperlipidemia     .  Renal insufficiency     .  CHF (congestive heart failure)     .  Dialysis patient     .  Anemia         Past Surgical History   Procedure  Date   .  Coronary artery bypass graft  2008   .  Cholecystectomy     .  Abdominal hysterectomy     .  Tubal ligation     .  Tonsillectomy     .  Insert / replace / remove pacemaker         pacemaker ICD   .  Esophagogastroduodenoscopy  09/15/2011       Procedure: ESOPHAGOGASTRODUODENOSCOPY (EGD);  Surgeon: Theda Belfast, MD;  Location: [facility] ENDOSCOPY;  Service: Endoscopy;  Laterality: [name];       Prior to Admission medications    Medication  Sig  Start Date  End Date  Taking?  Authorizing Provider   multivitamin (RENA-VIT) TABS tablet  Take 1 tablet by mouth daily.  10/14/11    Yes  Clanford Cyndie Mull, MD   pravastatin (PRAVACHOL) 20 MG tablet  Take 20 mg by mouth at bedtime.       Yes  Historical Provider, MD   ranolazine (RANEXA) 500 MG 12 hr tablet  Take 500 mg by mouth daily.        Yes  Historical Provider,  MD   sevelamer (RENAGEL) 800 MG tablet  Take 800 mg by mouth 3 (three) times daily with meals.       Yes  Historical Provider, MD   HYDROmorphone (DILAUDID) 2 MG tablet  Take 1 tablet (2 mg total) by mouth 3 (three) times daily. Takes along with methadone.  03/04/12      [name], MD   methadone (DOLOPHINE) 5 MG tablet  Take 1 tablet (5 mg total) by mouth 3 (three) times daily. Takes along with Dilaudid.  03/04/12      Srikar Cherlynn Kaiser, MD   Nutritional Supplements (FEEDING SUPPLEMENT, NEPRO CARB STEADY,) LIQD  Take 237 mLs by mouth 3 (three) times daily between meals.  03/04/12      Srikar Cherlynn Kaiser, MD   pantoprazole (PROTONIX) 40 MG tablet  Take 40 mg by mouth daily.        Historical Provider, MD  No current facility-administered medications for this encounter.       Current Outpatient Prescriptions   Medication  Sig  Dispense  Refill   .  multivitamin (RENA-VIT) TABS tablet  Take 1 tablet by mouth daily.         .  pravastatin (PRAVACHOL) 20 MG tablet  Take 20 mg by mouth at bedtime.          .  ranolazine (RANEXA) 500 MG 12 hr tablet  Take 500 mg by mouth daily.           .  sevelamer (RENAGEL) 800 MG tablet  Take 800 mg by mouth 3 (three) times daily with meals.          [name]  HYDROmorphone (DILAUDID) 2 MG tablet  Take 1 tablet (2 mg total) by mouth 3 (three) times daily. Takes along with methadone.   21 tablet   0   .  methadone (DOLOPHINE) 5 MG tablet  Take 1 tablet (5 mg total) by mouth 3 (three) times daily. Takes along with Dilaudid.   21 tablet   0   .  Nutritional Supplements (FEEDING SUPPLEMENT, NEPRO CARB STEADY,) LIQD  Take 237 mLs by mouth 3 (three) times daily between meals.         .  pantoprazole (PROTONIX) 40 MG tablet  Take 40 mg by mouth daily.             Facility-Administered Medications Ordered in Other Encounters   Medication  Dose  Route  Frequency  Provider  Last Rate  Last Dose   .  0.9 %  sodium chloride infusion     Intravenous  Once  Rodena Medin,  PA-C  125 mL/hr at 04/01/12 1704  125 mL/hr at 04/01/12 1704   .  0.9 %  sodium chloride infusion     Intravenous  Once  Rodena Medin, PA-C  75 mL/hr at 04/01/12 1722  75 mL/hr at 04/01/12 1722   .  0.9 %  sodium chloride infusion     Intravenous  STAT  Rodena Medin, PA-C         .  HYDROmorphone (DILAUDID) injection 0.5 mg   0.5 mg  Intravenous  Once  Rodena Medin, PA-C     0.5 mg at 04/01/12 1815   .  HYDROmorphone (DILAUDID) injection 0.5 mg   0.5 mg  Intravenous  Once  Rodena Medin, PA-C     0.5 mg at 04/01/12 2131   .  HYDROmorphone (DILAUDID) injection 0.5 mg   0.5 mg  Intravenous  Q2H PRN  Rodena Medin, PA-C         .  ondansetron (ZOFRAN) injection 4 mg   4 mg  Intravenous  Once  Rodena Medin, PA-C     4 mg at 04/01/12 1814       Allergies as of 03/02/2012 - Review Complete 03/02/2012   Allergen  Reaction  Noted   .  Morphine and related  Anaphylaxis  12/01/2010   .  Codeine  Itching  12/01/2010   .  Lisinopril  Other (See Comments)  12/01/2010       Family History   Problem  Relation  Age of Onset   .  Coronary artery disease  Mother     .  Hypertension  Mother     .  Diabetes  Mother     .  Diabetes  Sister     .  Anesthesia problems  Neg Hx         History       Social History   .  Marital Status:  Single       Spouse Name:  [name]       Number of Children:  [name]   .  Years of Education:  [name]       Occupational History   .  Not on file.       Social History Main Topics   .  Smoking status:  Former Smoker       Quit date:  06/15/2000   .  Smokeless tobacco:  Not on file   .  Alcohol Use:  No   .  Drug Use:  No   .  Sexually Active:  Not on file       Other Topics  Concern   .  Not on file       Social History Narrative     The patient lives alone in [place].  She is a retired nurse-Psychiatric and ortho.  She is disabled and receives most of her care from the [place], [place].  Son-[name]-[phone] alone      Review of  Systems: Gen: ++, anorexia, fatigue, weakness, malaise, weight loss, and sleep disorder weight loss HEENT: Wears glasses, dentures ill fitting CV: Denies chest pain, angina, palpitations, syncope, orthopnea, PND, peripheral edema, and claudication. Resp: Complains of intermittent dyspnea, Waking up and orthopnea. Comes in below dry weight GI: Nausea and vomiting bile worse in dialysis for the last month history of pancreatitis GU : continues with urine output MS: Denies joint pain, limitation of movement, and swelling, stiffness, low back pain, extremity pain. Denies muscle weakness, cramps, atrophy.  No use of non steroidal antiinflammatory drugs. Derm: Denies rash, itching, dry skin, hives, moles, warts, or unhealing ulcers.   Psych: Denies depression, anxiety, memory loss, suicidal ideation, hallucinations, paranoia, and confusion. Heme: Denies bruising, bleeding, and enlarged lymph nodes. Neuro: No headache.  No diplopia. No dysarthria.  No dysphasia.  No history of CVA.  No Seizures. No paresthesias.  No weakness. Endocrine No DM.  No Thyroid disease.  No Adrenal disease.   Physical Exam: Vital signs in last 24 hours: Last BM Date: 03/02/12 General:  Chronically Ill Head:  Normocephalic and atraumatic. Eyes:  Sclera clear, no icterus.   Conjunctiva pale Ears:  Normal auditory acuity. Nose:  No deformity, discharge,  or lesions. Mouth:  No deformity or lesions, dentition normal. Neck:  Supple; no masses or thyromegaly. JVP not elevated Lungs:  Poor air entry and diminished at bases Heart:  Faint systolic Abdomen:  Soft, nontender and nondistended. No masses, hepatosplenomegaly or hernias noted. Normal bowel sounds, without guarding, and without rebound.    Msk:  Symmetrical without gross deformities. Normal posture. Pulses:  No carotid, renal, femoral bruits. DP and PT symmetrical and equal Extremities:  Without clubbing or edema. Neurologic:  Alert and  oriented x4;  grossly  normal neurologically. Skin:  Intact without significant lesions or rashes. Cervical Nodes:  No significant cervical adenopathy. Psych:  Alert and cooperative. Normal mood and affect.   Intake/Output from previous day: Intake/Output this shift:   Lab Results: Basename  04/01/12 1630   WBC  11.6*   HGB  8.9*   HCT  30.0*   PLT  286    BMET Basename  04/01/12 1849  04/01/12 1630   NA  --  131*   K  6.4*  6.6*  CL  --  93*   CO2  --  21   GLUCOSE  --  89   BUN  --  54*   CREATININE  --  8.23*   CALCIUM  --  10.4   PHOS  --  --    LFT Basename  04/01/12 1630   PROT  7.8   ALBUMIN  2.5*   AST  17   ALT  8   ALKPHOS  119*   BILITOT  0.5   BILIDIR  --   IBILI  --    PT/INR No results found for this basename: LABPROT:2,INR:2 in the last 72 hours Hepatitis Panel No results found for this basename: HEPBSAG,HCVAB,HEPAIGM,HEPBIGM in the last 72 hours   Studies/Results: Dg Abd Acute W/chest   04/01/2012  *RADIOLOGY REPORT*  Clinical Data: Shortness of breath, cough, chronic pancreatitis  ACUTE ABDOMEN SERIES (ABDOMEN 2 VIEW & CHEST 1 VIEW)  Comparison: CT 02/23/2012, chest radiograph 02/23/2012  Findings: Left AICD in place.  Evidence of CABG.  Moderate enlargement of the cardiomediastinal silhouette is stable.  Central vascular congestion without overt edema.  Trace pleural effusions. No free air beneath the diaphragms.  The most inferior sternal wire is again noted to be discontinuous.  Cholecystectomy clips noted.  Vascular calcifications are noted. Normal visualized bowel gas pattern.  Left upper quadrant anastomotic chain sutures are noted.  No acute osseous abnormality.  IMPRESSION: Normal bowel gas pattern.  Trace bilateral pleural effusions and cardiomegaly.   Original Report Authenticated By: Harrel Lemon, M.D.      Assessment/Plan: Evaluation for management of ESRD. Vitals are stable with no distress although has lost weight and has lower extremity edema.she  receives dialysis MWF at [facility].She is dialyzed for 3 hrs on a 180 non-reuse dialyzer with a 2.5 calcium and 2 potassium dialysate and linear sodium 138.She receives erythropoietin and iron to keep his Hb above 10 and IV Vitamin D and phosphate binders to keep his Bone mineral goals at [facility].She has a permcath that was placed in September. We shall proceed with emergent  in hospital scheduled dialysis  with 1 potassium bath due to hyperkalemia and run 4 hours tonight. We shall also challenge her weight due to her edema

## 2012-04-02 DIAGNOSIS — I5022 Chronic systolic (congestive) heart failure: Secondary | ICD-10-CM

## 2012-04-02 DIAGNOSIS — D631 Anemia in chronic kidney disease: Secondary | ICD-10-CM

## 2012-04-02 DIAGNOSIS — R5381 Other malaise: Secondary | ICD-10-CM

## 2012-04-02 DIAGNOSIS — N189 Chronic kidney disease, unspecified: Secondary | ICD-10-CM

## 2012-04-02 DIAGNOSIS — I509 Heart failure, unspecified: Secondary | ICD-10-CM

## 2012-04-02 LAB — BASIC METABOLIC PANEL
CO2: 27 mEq/L (ref 19–32)
Calcium: 7.9 mg/dL — ABNORMAL LOW (ref 8.4–10.5)
Creatinine, Ser: 3.76 mg/dL — ABNORMAL HIGH (ref 0.50–1.10)
GFR calc Af Amer: 14 mL/min — ABNORMAL LOW (ref >90)
GFR calc non Af Amer: 12 mL/min — ABNORMAL LOW (ref >90)
Sodium: 135 mEq/L (ref 135–145)

## 2012-04-02 LAB — CBC
Platelets: 159 10*3/uL (ref 150–400)
RBC: 2.8 MIL/uL — ABNORMAL LOW (ref 3.87–5.11)
RDW: 18.7 % — ABNORMAL HIGH (ref 11.5–15.5)
WBC: 9.4 10*3/uL (ref 4.0–10.5)

## 2012-04-02 LAB — URINALYSIS, MICROSCOPIC ONLY
Glucose, UA: NEGATIVE mg/dL
Hgb urine dipstick: NEGATIVE
Ketones, ur: NEGATIVE mg/dL
Protein, ur: 100 mg/dL — AB

## 2012-04-02 MED ORDER — ONDANSETRON HCL 4 MG/2ML IJ SOLN
INTRAMUSCULAR | Status: AC
  Filled 2012-04-02: qty 2

## 2012-04-02 MED ORDER — ONDANSETRON HCL 4 MG/2ML IJ SOLN
4.0000 mg | Freq: Four times a day (QID) | INTRAMUSCULAR | Status: DC | PRN
  Administered 2012-04-02 – 2012-04-05 (×3): 4 mg via INTRAVENOUS
  Filled 2012-04-02 (×2): qty 2

## 2012-04-02 MED ORDER — DARBEPOETIN ALFA-POLYSORBATE 100 MCG/0.5ML IJ SOLN
100.0000 ug | Freq: Once | INTRAMUSCULAR | Status: AC
  Filled 2012-04-02: qty 0.5

## 2012-04-02 MED ORDER — SODIUM CHLORIDE 0.9 % IJ SOLN: 3.0000 mL | INTRAMUSCULAR | Status: DC | PRN

## 2012-04-02 MED ORDER — SODIUM CHLORIDE 0.9 % IJ SOLN
3.0000 mL | Freq: Two times a day (BID) | INTRAMUSCULAR | Status: DC
  Administered 2012-04-02 – 2012-04-05 (×5): 3 mL via INTRAVENOUS

## 2012-04-02 MED ORDER — HYDROMORPHONE HCL PF 1 MG/ML IJ SOLN
INTRAMUSCULAR | Status: AC
  Administered 2012-04-02: 0.5 mg via INTRAVENOUS
  Filled 2012-04-02: qty 1

## 2012-04-02 MED ORDER — ONDANSETRON HCL 4 MG PO TABS: 4.0000 mg | ORAL_TABLET | Freq: Four times a day (QID) | ORAL | Status: DC | PRN

## 2012-04-02 MED ORDER — RANOLAZINE ER 500 MG PO TB12
500.0000 mg | ORAL_TABLET | Freq: Every day | ORAL | Status: DC
  Administered 2012-04-02 – 2012-04-05 (×4): 500 mg via ORAL
  Filled 2012-04-02 (×4): qty 1

## 2012-04-02 MED ORDER — SIMVASTATIN 10 MG PO TABS
10.0000 mg | ORAL_TABLET | Freq: Every day | ORAL | Status: DC
  Administered 2012-04-02 – 2012-04-05 (×4): 10 mg via ORAL
  Filled 2012-04-02 (×4): qty 1

## 2012-04-02 MED ORDER — PANTOPRAZOLE SODIUM 40 MG PO TBEC
40.0000 mg | DELAYED_RELEASE_TABLET | Freq: Every day | ORAL | Status: DC
  Administered 2012-04-02 – 2012-04-05 (×4): 40 mg via ORAL
  Filled 2012-04-02 (×4): qty 1

## 2012-04-02 MED ORDER — SODIUM CHLORIDE 0.9 % IJ SOLN
3.0000 mL | Freq: Two times a day (BID) | INTRAMUSCULAR | Status: DC
  Administered 2012-04-05: 3 mL via INTRAVENOUS

## 2012-04-02 MED ORDER — CARVEDILOL 3.125 MG PO TABS
3.1250 mg | ORAL_TABLET | Freq: Two times a day (BID) | ORAL | Status: DC
  Administered 2012-04-02 – 2012-04-05 (×6): 3.125 mg via ORAL
  Filled 2012-04-02 (×8): qty 1

## 2012-04-02 MED ORDER — HYDROMORPHONE HCL 2 MG PO TABS
2.0000 mg | ORAL_TABLET | Freq: Three times a day (TID) | ORAL | Status: DC
  Administered 2012-04-02 – 2012-04-05 (×11): 2 mg via ORAL
  Filled 2012-04-02 (×9): qty 1
  Filled 2012-04-02: qty 2
  Filled 2012-04-02: qty 1

## 2012-04-02 MED ORDER — NEPRO/CARBSTEADY PO LIQD
237.0000 mL | Freq: Three times a day (TID) | ORAL | Status: DC
  Administered 2012-04-02 – 2012-04-05 (×10): 237 mL via ORAL

## 2012-04-02 MED ORDER — RENA-VITE PO TABS
1.0000 | ORAL_TABLET | Freq: Every day | ORAL | Status: DC
  Administered 2012-04-02 – 2012-04-04 (×3): 1 via ORAL
  Filled 2012-04-02 (×4): qty 1

## 2012-04-02 MED ORDER — SODIUM CHLORIDE 0.9 % IV SOLN: 250.0000 mL | INTRAVENOUS | Status: DC | PRN

## 2012-04-02 MED ORDER — SEVELAMER CARBONATE 800 MG PO TABS
800.0000 mg | ORAL_TABLET | Freq: Three times a day (TID) | ORAL | Status: DC
  Administered 2012-04-02 – 2012-04-05 (×8): 800 mg via ORAL
  Filled 2012-04-02 (×13): qty 1

## 2012-04-02 MED ORDER — METHADONE HCL 10 MG PO TABS
5.0000 mg | ORAL_TABLET | Freq: Three times a day (TID) | ORAL | Status: DC
  Administered 2012-04-02 – 2012-04-05 (×11): 5 mg via ORAL
  Filled 2012-04-02: qty 2
  Filled 2012-04-02 (×4): qty 1
  Filled 2012-04-02: qty 2
  Filled 2012-04-02 (×5): qty 1

## 2012-04-02 NOTE — Progress Notes (Signed)
[facility] PROGRESS NOTE  [name] ZOX:[id number] DOB: [birth year] DOA: 04/01/2012 PCP: [name], MD  Assessment/Plan: Hyperkalemia- s/p dialysis that she missed earlier- K+ back in normal range  Non sustained v tach- check Mg, add BB  .Chronic systolic congestive heart failure, NYHA class 3- EF 20-25%- controlled with dialysis  fatigue ? Related to hyperkalemia, PT eval- has home health PT already, can walk but usually uses wheelchair  .Anemia in chronic kidney disease (CKD)- aranesp, no c/o blood in stool, no back pain  hypertension add low dose BB   Code Status: full Family Communication: patient at bedside Disposition Plan:    Consultants:  nephrology   HPI/Subjective: Patient tired, got dialysis last PM  Objective: Filed Vitals:   04/02/12 0230 04/02/12 0300 04/02/12 0434 04/02/12 1015  BP: 124/80 127/91 131/81 134/71  Pulse: 80 86 77 81  Temp:  97.6 F (36.4 C) 97.3 F (36.3 C) 97.8 F (36.6 C)  TempSrc:   Oral Oral  Resp: 20 20 20 18   Weight:   55.8 kg (123 lb 0.3 oz)   SpO2:   99% 96%    Intake/Output Summary (Last 24 hours) at 04/02/12 1102 Last data filed at 04/02/12 0800  Gross per 24 hour  Intake      0 ml  Output   2000 ml  Net  -2000 ml   Filed Weights   04/02/12 0434  Weight: 55.8 kg (123 lb 0.3 oz)    Exam:   General:  A+Ox3, NAD  Cardiovascular: rrr tele: few beat run of v tach  Respiratory: clear  Abdomen: +BS, soft, NT  Data Reviewed: Basic Metabolic Panel:  Lab 04/02/12 8119 04/01/12 1849 04/01/12 1630  NA 135 -- 131*  K 4.4 6.4* 6.6*  CL 98 -- 93*  CO2 27 -- 21  GLUCOSE 70 -- 89  BUN 18 -- 54*  CREATININE 3.76* -- 8.23*  CALCIUM 7.9* -- 10.4  MG -- -- --  PHOS -- -- --   Liver Function Tests:  Lab 04/01/12 1630  AST 17  ALT 8  ALKPHOS 119*  BILITOT 0.5  PROT 7.8  ALBUMIN 2.5*    Lab 04/01/12 1630  LIPASE 11  AMYLASE --   No results found for this basename: AMMONIA:5 in the last 168  hours CBC:  Lab 04/02/12 0710 04/01/12 1630  WBC 9.4 11.6*  NEUTROABS -- 9.3*  HGB 7.2* 8.9*  HCT 24.0* 30.0*  MCV 85.7 85.7  PLT 159 286   Cardiac Enzymes: No results found for this basename: CKTOTAL:5,CKMB:5,CKMBINDEX:5,TROPONINI:5 in the last 168 hours BNP (last 3 results)  Basename 03/02/12 1633 06/02/11 2034  PROBNP >70000.0* 31365.0*   CBG: No results found for this basename: GLUCAP:5 in the last 168 hours  No results found for this or any previous visit (from the past 240 hour(s)).   Studies: Dg Abd Acute W/chest  04/01/2012  *RADIOLOGY REPORT*  Clinical Data: Shortness of breath, cough, chronic pancreatitis  ACUTE ABDOMEN SERIES (ABDOMEN 2 VIEW & CHEST 1 VIEW)  Comparison: CT 02/23/2012, chest radiograph 02/23/2012  Findings: Left AICD in place.  Evidence of CABG.  Moderate enlargement of the cardiomediastinal silhouette is stable.  Central vascular congestion without overt edema.  Trace pleural effusions. No free air beneath the diaphragms.  The most inferior sternal wire is again noted to be discontinuous.  Cholecystectomy clips noted.  Vascular calcifications are noted. Normal visualized bowel gas pattern.  Left upper quadrant anastomotic chain sutures are noted.  No acute osseous  abnormality.  IMPRESSION: Normal bowel gas pattern.  Trace bilateral pleural effusions and cardiomegaly.   Original Report Authenticated By: Harrel Lemon, M.D.     Scheduled Meds:   . sodium chloride   Intravenous Once  . sodium chloride   Intravenous STAT  . carvedilol  3.125 mg Oral BID WC  . darbepoetin (ARANESP) injection - DIALYSIS  100 mcg Subcutaneous ONCE-1800  . feeding supplement (NEPRO CARB STEADY)  237 mL Oral TID BM  .  HYDROmorphone (DILAUDID) injection  0.5 mg Intravenous Once  .  HYDROmorphone (DILAUDID) injection  0.5 mg Intravenous Once  . HYDROmorphone  2 mg Oral TID  . methadone  5 mg Oral TID  . multivitamin  1 tablet Oral QHS  . ondansetron      . ondansetron  (ZOFRAN) IV  4 mg Intravenous Once  . pantoprazole  40 mg Oral Daily  . ranolazine  500 mg Oral Daily  . sevelamer  800 mg Oral TID WC  . simvastatin  10 mg Oral q1800  . sodium chloride  3 mL Intravenous Q12H  . sodium chloride  3 mL Intravenous Q12H  . DISCONTD: sodium chloride   Intravenous Once   Continuous Infusions:   Principal Problem:  *Hyperkalemia Active Problems:  Chronic systolic congestive heart failure, NYHA class 3- EF 20-25%  Hypertension  Pancreatitis, chronic  Anemia in chronic kidney disease (CKD)  Fatigue    Time spent: 35 min    Benjamine Mola JESSICA  [facility] Pager [phone] 04/02/2012, 11:02 AM  LOS: 1 day

## 2012-04-02 NOTE — Progress Notes (Signed)
UR completed 

## 2012-04-02 NOTE — H&P (Signed)
[facility] History and Physical  LASHELLE KOY [name]:[id number] DOB: [birth year]    PCP:   [name], MD   Chief Complaint: feeling weak, found to have hyperkalemia.  HPI: [name] is an 61 y.o. female with ESRD on HD in [place], missed her dialysis today, presents to the ER feeling weak, but no SOB, CP, N/V or diarrhea.  Evaluation in the ER showed WBC of 11.6 and Hb of 9.8, but her K is 6.1 and EKG showed no peak T waves.  Hospitalist was asked to admit her into OBS for urgent dialysis.   Rewiew of Systems:  Constitutional: Negative for malaise, fever and chills.  Eyes: Negative for eye pain, redness and discharge, diplopia, visual changes, or flashes of light. ENMT: Negative for ear pain, hoarseness, nasal congestion, sinus pressure and sore throat. No headaches; tinnitus, drooling, or problem swallowing. Cardiovascular: Negative for chest pain, palpitations, diaphoresis, dyspnea and peripheral edema. ; No orthopnea, PND Respiratory: Negative for cough, hemoptysis, wheezing and stridor. No pleuritic chestpain. Gastrointestinal: Negative for nausea, vomiting, diarrhea, constipation, abdominal pain, melena, blood in stool, hematemesis, jaundice and rectal bleeding.    Genitourinary: Negative for frequency, dysuria, incontinence,flank pain and hematuria; Musculoskeletal: Negative for back pain and neck pain. Negative for swelling and trauma.;  Skin: . Negative for pruritus, rash, abrasions, bruising and skin lesion.; ulcerations Neuro: Negative for headache, lightheadedness and neck stiffness. Negative for , altered level of consciousness , altered mental status, extremity weakness, burning feet, involuntary movement, seizure and syncope.  Psych: negative for anxiety, depression, insomnia, tearfulness, panic attacks, hallucinations, paranoia, suicidal or homicidal ideation    Past Medical History  Diagnosis Date  . Gallstone pancreatitis   . Coronary artery disease     s/p  CABG 2008 with multiple PCIs  . Ischemic cardiomyopathy   . End stage renal disease   . Diabetes mellitus   . Hypertension   . History of nonadherence to medical treatment   . LBBB (left bundle branch block)   . Hyperlipidemia   . Renal insufficiency   . CHF (congestive heart failure)   . Dialysis patient   . Anemia     Past Surgical History  Procedure Date  . Coronary artery bypass graft 2008  . Cholecystectomy   . Abdominal hysterectomy   . Tubal ligation   . Tonsillectomy   . Insert / replace / remove pacemaker     pacemaker ICD  . Esophagogastroduodenoscopy 09/15/2011    Procedure: ESOPHAGOGASTRODUODENOSCOPY (EGD);  Surgeon: Theda Belfast, MD;  Location: [facility] ENDOSCOPY;  Service: Endoscopy;  Laterality: N/A;    Medications:  HOME MEDS: Prior to Admission medications   Medication Sig Start Date End Date Taking? Authorizing Provider  HYDROmorphone (DILAUDID) 2 MG tablet Take 1 tablet (2 mg total) by mouth 3 (three) times daily. Takes along with methadone. 03/04/12  Yes Srikar Cherlynn Kaiser, MD  methadone (DOLOPHINE) 5 MG tablet Take 1 tablet (5 mg total) by mouth 3 (three) times daily. Takes along with Dilaudid. 03/04/12  Yes Srikar Cherlynn Kaiser, MD  multivitamin (RENA-VIT) TABS tablet Take 1 tablet by mouth daily. 10/14/11  Yes Clanford Cyndie Mull, MD  pantoprazole (PROTONIX) 40 MG tablet Take 40 mg by mouth daily.   Yes Historical Provider, MD  pravastatin (PRAVACHOL) 20 MG tablet Take 20 mg by mouth at bedtime.    Yes Historical Provider, MD  ranolazine (RANEXA) 500 MG 12 hr tablet Take 500 mg by mouth daily.     Yes Historical Provider, MD  sevelamer (RENAGEL) 800 MG tablet Take 800 mg by mouth 3 (three) times daily with meals.    Yes Historical Provider, MD  Nutritional Supplements (FEEDING SUPPLEMENT, NEPRO CARB STEADY,) LIQD Take 237 mLs by mouth 3 (three) times daily between meals. 03/04/12   [name], MD     Allergies:  Allergies  Allergen Reactions  . Morphine And Related  Anaphylaxis  . Codeine Itching  . Lisinopril Other (See Comments)    unknown    Social History:   reports that she quit smoking about 11 years ago. She does not have any smokeless tobacco history on file. She reports that she does not drink alcohol or use illicit drugs.  Family History: Family History  Problem Relation Age of Onset  . Coronary artery disease Mother   . Hypertension Mother   . Diabetes Mother   . Diabetes Sister   . Anesthesia problems Neg Hx      Physical Exam: Filed Vitals:   04/01/12 2300 04/01/12 2305 04/01/12 2330 04/02/12 0001  BP: 119/80 134/96 131/87 132/83  Pulse: 87 89 96 92  Temp: 97.4 F (36.3 C)     TempSrc: Oral     Resp: 16 16 16 16   SpO2: 97%      Blood pressure 132/83, pulse 92, temperature 97.4 F (36.3 C), temperature source Oral, resp. rate 16, SpO2 97.00%.  GEN:  Pleasant  patient lying in the stretcher in no acute distress; cooperative with exam. PSYCH:  alert and oriented x4; does not appear anxious or depressed; affect is appropriate. HEENT: Mucous membranes pink and anicteric; PERRLA; EOM intact; no cervical lymphadenopathy nor thyromegaly or carotid bruit; no JVD; There were no stridor. Neck is very supple. Breasts:: Not examined CHEST WALL: No tenderness. Dialysis catheter in her right upper chest. CHEST: Normal respiration, clear to auscultation bilaterally.  HEART: Regular rate and rhythm.  There are no murmur, rub, or gallops.   BACK: No kyphosis or scoliosis; no CVA tenderness ABDOMEN: soft and non-tender; no masses, no organomegaly, normal abdominal bowel sounds; no pannus; no intertriginous candida. There is no rebound and no distention. Rectal Exam: Not done EXTREMITIES: No bone or joint deformity; age-appropriate arthropathy of the hands and knees; no edema; no ulcerations.  There is no calf tenderness. Genitalia: not examined PULSES: 2+ and symmetric SKIN: Normal hydration no rash or ulceration CNS: Cranial nerves  2-12 grossly intact no focal lateralizing neurologic deficit.  Speech is fluent; uvula elevated with phonation, facial symmetry and tongue midline. DTR are normal bilaterally, cerebella exam is intact, barbinski is negative and strengths are equaled bilaterally.  No sensory loss.   Labs on Admission:  Basic Metabolic Panel:  Lab 04/01/12 4098 04/01/12 1630  NA -- 131*  K 6.4* 6.6*  CL -- 93*  CO2 -- 21  GLUCOSE -- 89  BUN -- 54*  CREATININE -- 8.23*  CALCIUM -- 10.4  MG -- --  PHOS -- --   Liver Function Tests:  Lab 04/01/12 1630  AST 17  ALT 8  ALKPHOS 119*  BILITOT 0.5  PROT 7.8  ALBUMIN 2.5*    Lab 04/01/12 1630  LIPASE 11  AMYLASE --   No results found for this basename: AMMONIA:5 in the last 168 hours CBC:  Lab 04/01/12 1630  WBC 11.6*  NEUTROABS 9.3*  HGB 8.9*  HCT 30.0*  MCV 85.7  PLT 286   Cardiac Enzymes: No results found for this basename: CKTOTAL:5,CKMB:5,CKMBINDEX:5,TROPONINI:5 in the last 168 hours  CBG: No  results found for this basename: GLUCAP:5 in the last 168 hours   Radiological Exams on Admission: Dg Abd Acute W/chest  04/01/2012  *RADIOLOGY REPORT*  Clinical Data: Shortness of breath, cough, chronic pancreatitis  ACUTE ABDOMEN SERIES (ABDOMEN 2 VIEW & CHEST 1 VIEW)  Comparison: CT 02/23/2012, chest radiograph 02/23/2012  Findings: Left AICD in place.  Evidence of CABG.  Moderate enlargement of the cardiomediastinal silhouette is stable.  Central vascular congestion without overt edema.  Trace pleural effusions. No free air beneath the diaphragms.  The most inferior sternal wire is again noted to be discontinuous.  Cholecystectomy clips noted.  Vascular calcifications are noted. Normal visualized bowel gas pattern.  Left upper quadrant anastomotic chain sutures are noted.  No acute osseous abnormality.  IMPRESSION: Normal bowel gas pattern.  Trace bilateral pleural effusions and cardiomegaly.   Original Report Authenticated By: Harrel Lemon, M.D.     EKG: Independently reviewed. No peaked T waves.   Assessment/Plan Present on Admission:  .Hyperkalemia .Chronic systolic congestive heart failure, NYHA class 3- EF 20-25% .Fatigue .Anemia in chronic kidney disease (CKD) .Pancreatitis, chronic .Hypertension   PLAN:  She missed her dialysis today, and presents with severe hyperkalemia.  She will be admitted under OBS, and will get hemodialysis tonight.  Dr Hyman Hopes has been consulted.  She is otherwise stable, and will continue her home meds.  Will admit her to telemetry under [facility] service. Other plans as per orders.  Code Status: FULL Unk Lightning, MD. [facility] Pager [phone] 7pm to 7am.  04/02/2012, 12:29 AM

## 2012-04-02 NOTE — Progress Notes (Signed)
04/02/2012 10:47 AM  Pt just had a seven beat run of vtach. Dr. Benjamine Mola currently on unit and reviewing the strip.  Orders for labs received.Will continue to monitor patient. [name]

## 2012-04-02 NOTE — Progress Notes (Signed)
04/02/2012 10:32 AM  NS/MT called this RN to inform me that the patient was having frequent PVCs and had a three beat PVC a few moments prior to phone call.  Pt currently asymptomatic, vitals stable.  Dr. Benjamine Mola notified. Will continue to monitor patient.  Eunice Blase

## 2012-04-02 NOTE — Progress Notes (Signed)
[facility] ROUNDING NOTE   Subjective:   Interval History:no complaints  Objective:  Vital signs in last 24 hours:  Temp:  [97.3 F (36.3 C)-98.3 F (36.8 C)] 97.3 F (36.3 C) (10/19 0434) Pulse Rate:  [75-96] 77  (10/19 0434) Resp:  [16-23] 20  (10/19 0434) BP: (119-147)/(73-103) 131/81 mmHg (10/19 0434) SpO2:  [96 %-100 %] 99 % (10/19 0434) Weight:  [55.8 kg (123 lb 0.3 oz)] 55.8 kg (123 lb 0.3 oz) (10/19 0434)  Weight change:  Filed Weights   04/02/12 0434  Weight: 55.8 kg (123 lb 0.3 oz)    Intake/Output: I/O last 3 completed shifts: In: -  Out: 2000 [Other:2000]   Intake/Output this shift:     CVS- RRR RS- CTA ABD- BS present soft non-distended EXT- 1 + edema   Basic Metabolic Panel:  Lab 04/02/12 1610 04/01/12 1849 04/01/12 1630  NA 135 -- 131*  K 4.4 6.4* 6.6*  CL 98 -- 93*  CO2 27 -- 21  GLUCOSE 70 -- 89  BUN 18 -- 54*  CREATININE 3.76* -- 8.23*  CALCIUM 7.9* -- 10.4  MG -- -- --  PHOS -- -- --    Liver Function Tests:  Lab 04/01/12 1630  AST 17  ALT 8  ALKPHOS 119*  BILITOT 0.5  PROT 7.8  ALBUMIN 2.5*    Lab 04/01/12 1630  LIPASE 11  AMYLASE --   No results found for this basename: AMMONIA:3 in the last 168 hours  CBC:  Lab 04/02/12 0710 04/01/12 1630  WBC 9.4 11.6*  NEUTROABS -- 9.3*  HGB 7.2* 8.9*  HCT 24.0* 30.0*  MCV 85.7 85.7  PLT 159 286    Cardiac Enzymes: No results found for this basename: CKTOTAL:5,CKMB:5,CKMBINDEX:5,TROPONINI:5 in the last 168 hours  BNP: No components found with this basename: POCBNP:5  CBG: No results found for this basename: GLUCAP:5 in the last 168 hours  Microbiology: Results for orders placed during the hospital encounter of 02/23/12  MRSA PCR SCREENING     Status: Normal   Collection Time   02/24/12  4:59 AM      Component Value Range Status Comment   MRSA by PCR NEGATIVE  NEGATIVE Final   CULTURE, BLOOD (ROUTINE X 2)     Status: Normal   Collection Time   02/24/12  6:25 AM      Component Value Range Status Comment   Specimen Description BLOOD LEFT HAND   Final    Special Requests     Final    Value: BOTTLES DRAWN AEROBIC AND ANAEROBIC 10CC BLUE,5CC RED   Culture  Setup Time 02/24/2012 08:30   Final    Culture NO GROWTH 5 DAYS   Final    Report Status 03/01/2012 FINAL   Final   CULTURE, BLOOD (ROUTINE X 2)     Status: Normal   Collection Time   02/24/12  6:35 AM      Component Value Range Status Comment   Specimen Description BLOOD LEFT HAND   Final    Special Requests BOTTLES DRAWN AEROBIC ONLY 10CC   Final    Culture  Setup Time 02/24/2012 08:30   Final    Culture NO GROWTH 5 DAYS   Final    Report Status 03/01/2012 FINAL   Final     Coagulation Studies: No results found for this basename: LABPROT:5,INR:5 in the last 72 hours  Urinalysis: No results found for this basename: COLORURINE:2,APPERANCEUR:2,LABSPEC:2,PHURINE:2,GLUCOSEU:2,HGBUR:2,BILIRUBINUR:2,KETONESUR:2,PROTEINUR:2,UROBILINOGEN:2,NITRITE:2,LEUKOCYTESUR:2 in the last 72 hours  Imaging: Dg Abd Acute W/chest  04/01/2012  *RADIOLOGY REPORT*  Clinical Data: Shortness of breath, cough, chronic pancreatitis  ACUTE ABDOMEN SERIES (ABDOMEN 2 VIEW & CHEST 1 VIEW)  Comparison: CT 02/23/2012, chest radiograph 02/23/2012  Findings: Left AICD in place.  Evidence of CABG.  Moderate enlargement of the cardiomediastinal silhouette is stable.  Central vascular congestion without overt edema.  Trace pleural effusions. No free air beneath the diaphragms.  The most inferior sternal wire is again noted to be discontinuous.  Cholecystectomy clips noted.  Vascular calcifications are noted. Normal visualized bowel gas pattern.  Left upper quadrant anastomotic chain sutures are noted.  No acute osseous abnormality.  IMPRESSION: Normal bowel gas pattern.  Trace bilateral pleural effusions and cardiomegaly.   Original Report Authenticated By: Harrel Lemon, M.D.      Medications:        .  sodium chloride   Intravenous Once  . sodium chloride   Intravenous STAT  . feeding supplement (NEPRO CARB STEADY)  237 mL Oral TID BM  .  HYDROmorphone (DILAUDID) injection  0.5 mg Intravenous Once  .  HYDROmorphone (DILAUDID) injection  0.5 mg Intravenous Once  . HYDROmorphone  2 mg Oral TID  . methadone  5 mg Oral TID  . multivitamin  1 tablet Oral QHS  . ondansetron      . ondansetron (ZOFRAN) IV  4 mg Intravenous Once  . pantoprazole  40 mg Oral Daily  . ranolazine  500 mg Oral Daily  . sevelamer  800 mg Oral TID WC  . simvastatin  10 mg Oral q1800  . sodium chloride  3 mL Intravenous Q12H  . sodium chloride  3 mL Intravenous Q12H  . DISCONTD: sodium chloride   Intravenous Once   sodium chloride, HYDROmorphone (DILAUDID) injection, ondansetron (ZOFRAN) IV, ondansetron, sodium chloride  Assessment/ Plan:   ESRD- dialysis MWF  ANEMIA- significant drop in hemoglobin will give dose of aranesp and check iron stores  MBD-calcium 7.9  ACCESS-Carheter site clean and dry   LOS: 1 Sherylann Vangorden W @TODAY @9 :13 AM

## 2012-04-02 NOTE — Progress Notes (Signed)
INITIAL ADULT NUTRITION ASSESSMENT Date: 04/02/2012   Time: 12:33 PM Reason for Assessment: Nutrition Risk   ASSESSMENT: [name]  Dx: Hyperkalemia  INTERVENTION: 1. Will continue to provide Nepro TID, provides 1275 kcal and 57 grams protein daily.  2. RD to follow for nutrition plan of care.   Hx:  Past Medical History  Diagnosis Date  . Gallstone pancreatitis   . Coronary artery disease     s/p CABG 2008 with multiple PCIs  . Ischemic cardiomyopathy   . End stage renal disease   . Diabetes mellitus   . Hypertension   . History of nonadherence to medical treatment   . LBBB (left bundle branch block)   . Hyperlipidemia   . Renal insufficiency   . CHF (congestive heart failure)   . Dialysis patient   . Anemia     Related Meds:  Scheduled Meds:   . sodium chloride   Intravenous Once  . sodium chloride   Intravenous STAT  . carvedilol  3.125 mg Oral BID WC  . darbepoetin (ARANESP) injection - DIALYSIS  100 mcg Subcutaneous ONCE-1800  . feeding supplement (NEPRO CARB STEADY)  237 mL Oral TID BM  .  HYDROmorphone (DILAUDID) injection  0.5 mg Intravenous Once  .  HYDROmorphone (DILAUDID) injection  0.5 mg Intravenous Once  . HYDROmorphone  2 mg Oral TID  . methadone  5 mg Oral TID  . multivitamin  1 tablet Oral QHS  . ondansetron      . ondansetron (ZOFRAN) IV  4 mg Intravenous Once  . pantoprazole  40 mg Oral Daily  . ranolazine  500 mg Oral Daily  . sevelamer  800 mg Oral TID WC  . simvastatin  10 mg Oral q1800  . sodium chloride  3 mL Intravenous Q12H  . sodium chloride  3 mL Intravenous Q12H  . DISCONTD: sodium chloride   Intravenous Once   Continuous Infusions:  PRN Meds:.sodium chloride, HYDROmorphone (DILAUDID) injection, ondansetron (ZOFRAN) IV, ondansetron, sodium chloride   Ht:  5'4" per previous admission   Wt: 123 lb 0.3 oz (55.8 kg)  Ideal Wt:    54.5 kg % Ideal Wt: 102.5% Wt Readings from Last 10 Encounters:  04/02/12 123 lb 0.3 oz  (55.8 kg)  03/05/12 115 lb 8.3 oz (52.4 kg)  02/24/12 130 lb 4.7 oz (59.1 kg)  10/13/11 135 lb 6.4 oz (61.417 kg)  09/17/11 132 lb 7.9 oz (60.1 kg)  09/17/11 132 lb 7.9 oz (60.1 kg)  06/04/11 157 lb 13.6 oz (71.6 kg)  04/08/11 159 lb 6.4 oz (72.303 kg)  12/01/10 176 lb (79.833 kg)   *Patient weight is down 12 lb over 6 months, 8.8% from baseline.   Usual Wt: 176 lb per admission in April % Usual Wt: 70%  BMI: 21.1 kg/m^2 (WNL)  Food/Nutrition Related Hx: Noted patient weight in April 147 lb. Patient's weight has been trending down over the past few months. Patient appears thin with scapula wasting. Noted patient on dialysis for ESRD. Patient reported her appetite has been poor for a while. She reported PO intake of about 50% at meals. Patient was drinking Nepro nutrition supplement at time of RD visit and she reported she likes it.    Labs:  CMP     Component Value Date/Time   NA 135 04/02/2012 0710   K 4.4 04/02/2012 0710   CL 98 04/02/2012 0710   CO2 27 04/02/2012 0710   GLUCOSE 70 04/02/2012 0710   BUN 18  04/02/2012 0710   CREATININE 3.76* 04/02/2012 0710   CALCIUM 7.9* 04/02/2012 0710   PROT 7.8 04/01/2012 1630   ALBUMIN 2.5* 04/01/2012 1630   AST 17 04/01/2012 1630   ALT 8 04/01/2012 1630   ALKPHOS 119* 04/01/2012 1630   BILITOT 0.5 04/01/2012 1630   GFRNONAA 12* 04/02/2012 0710   GFRAA 14* 04/02/2012 0710    Intake/Output Summary (Last 24 hours) at 04/02/12 1239 Last data filed at 04/02/12 0800  Gross per 24 hour  Intake      0 ml  Output   2000 ml  Net  -2000 ml     Diet Order: Renal  Supplements/Tube Feeding: Nepro TID, 1275 kcal, 57 grams protein.   IVF:    Estimated Nutritional Needs:   Kcal: 1700-2000 Protein: 67-78 grams  Fluid: 1 ml per kcal intake   NUTRITION DIAGNOSIS: -Inadequate oral intake (NI-2.1).  Status: Ongoing  RELATED TO: poor appetite  AS EVIDENCE BY: pt reported PO intake of about 50% at meals    MONITORING/EVALUATION(Goals): PO intake, weights, labs 1. PO intake > 75% at meals and supplements.   EDUCATION NEEDS: -No education needs identified at this time  INTERVENTION: 1. Will continue to provide Nepro TID, provides 1275 kcal and 57 grams protein daily.  2. RD to follow for nutrition plan of care.   Dietitian [phone]  DOCUMENTATION CODES Per approved criteria  -Non-severe (moderate) malnutrition in the context of chronic illness  *Patient meets malnutrition criteria due to PO intake meeting < 75% of estimated energy needs for >/= 1 month and apparent mild loss of muscle mass and body.   Iven Finn [facility] 04/02/2012, 12:33 PM

## 2012-04-03 LAB — CBC
Hemoglobin: 7.8 g/dL — ABNORMAL LOW (ref 12.0–15.0)
MCHC: 29.5 g/dL — ABNORMAL LOW (ref 30.0–36.0)
RBC: 3.04 MIL/uL — ABNORMAL LOW (ref 3.87–5.11)

## 2012-04-03 LAB — RENAL FUNCTION PANEL
CO2: 25 mEq/L (ref 19–32)
Chloride: 96 mEq/L (ref 96–112)
GFR calc Af Amer: 10 mL/min — ABNORMAL LOW (ref >90)
GFR calc non Af Amer: 9 mL/min — ABNORMAL LOW (ref >90)
Sodium: 132 mEq/L — ABNORMAL LOW (ref 135–145)

## 2012-04-03 MED ORDER — GUAIFENESIN-DM 100-10 MG/5ML PO SYRP
5.0000 mL | ORAL_SOLUTION | ORAL | Status: DC | PRN
  Administered 2012-04-03 – 2012-04-05 (×4): 5 mL via ORAL
  Filled 2012-04-03 (×3): qty 5

## 2012-04-03 MED ORDER — HEPARIN SODIUM (PORCINE) 5000 UNIT/ML IJ SOLN
5000.0000 [IU] | Freq: Three times a day (TID) | INTRAMUSCULAR | Status: DC
  Administered 2012-04-03 – 2012-04-05 (×7): 5000 [IU] via SUBCUTANEOUS
  Filled 2012-04-03 (×9): qty 1

## 2012-04-03 NOTE — Progress Notes (Signed)
[facility] ROUNDING NOTE   Subjective:   Interval History: weakness  Objective:  Vital signs in last 24 hours:  Temp:  [97.7 F (36.5 C)-98.6 F (37 C)] 97.8 F (36.6 C) (10/20 0900) Pulse Rate:  [81-91] 89  (10/20 0900) Resp:  [18] 18  (10/20 0900) BP: (127-142)/(87-89) 140/87 mmHg (10/20 0900) SpO2:  [96 %-99 %] 96 % (10/20 0900)  Weight change:  Filed Weights   04/02/12 0434  Weight: 55.8 kg (123 lb 0.3 oz)    Intake/Output: I/O last 3 completed shifts: In: 972 [P.O.:960; I.V.:10; IV Piggyback:2] Out: 2025 [Urine:25; Other:2000]   Intake/Output this shift:     CVS- RRR RS- CTA ABD- BS present soft non-distended EXT-1 + edema   Basic Metabolic Panel:  Lab 04/02/12 1914 04/01/12 1849 04/01/12 1630  NA 135 -- 131*  K 4.4 6.4* 6.6*  CL 98 -- 93*  CO2 27 -- 21  GLUCOSE 70 -- 89  BUN 18 -- 54*  CREATININE 3.76* -- 8.23*  CALCIUM 7.9* -- 10.4  MG 1.8 -- --  PHOS -- -- --    Liver Function Tests:  Lab 04/01/12 1630  AST 17  ALT 8  ALKPHOS 119*  BILITOT 0.5  PROT 7.8  ALBUMIN 2.5*    Lab 04/01/12 1630  LIPASE 11  AMYLASE --   No results found for this basename: AMMONIA:3 in the last 168 hours  CBC:  Lab 04/02/12 0710 04/01/12 1630  WBC 9.4 11.6*  NEUTROABS -- 9.3*  HGB 7.2* 8.9*  HCT 24.0* 30.0*  MCV 85.7 85.7  PLT 159 286    Cardiac Enzymes: No results found for this basename: CKTOTAL:5,CKMB:5,CKMBINDEX:5,TROPONINI:5 in the last 168 hours  BNP: No components found with this basename: POCBNP:5  CBG: No results found for this basename: GLUCAP:5 in the last 168 hours  Microbiology: Results for orders placed during the hospital encounter of 04/01/12  MRSA PCR SCREENING     Status: Normal   Collection Time   04/02/12  7:27 AM      Component Value Range Status Comment   MRSA by PCR NEGATIVE  NEGATIVE Final     Coagulation Studies: No results found for this basename: LABPROT:5,INR:5 in the last 72  hours  Urinalysis:  Basename 04/02/12 1548  COLORURINE YELLOW  LABSPEC 1.014  PHURINE 8.5*  GLUCOSEU NEGATIVE  HGBUR NEGATIVE  BILIRUBINUR NEGATIVE  KETONESUR NEGATIVE  PROTEINUR 100*  UROBILINOGEN 0.2  NITRITE NEGATIVE  LEUKOCYTESUR TRACE*      Imaging: Dg Abd Acute W/chest  04/01/2012  *RADIOLOGY REPORT*  Clinical Data: Shortness of breath, cough, chronic pancreatitis  ACUTE ABDOMEN SERIES (ABDOMEN 2 VIEW & CHEST 1 VIEW)  Comparison: CT 02/23/2012, chest radiograph 02/23/2012  Findings: Left AICD in place.  Evidence of CABG.  Moderate enlargement of the cardiomediastinal silhouette is stable.  Central vascular congestion without overt edema.  Trace pleural effusions. No free air beneath the diaphragms.  The most inferior sternal wire is again noted to be discontinuous.  Cholecystectomy clips noted.  Vascular calcifications are noted. Normal visualized bowel gas pattern.  Left upper quadrant anastomotic chain sutures are noted.  No acute osseous abnormality.  IMPRESSION: Normal bowel gas pattern.  Trace bilateral pleural effusions and cardiomegaly.   Original Report Authenticated By: Harrel Lemon, M.D.      Medications:        . sodium chloride   Intravenous STAT  . carvedilol  3.125 mg Oral BID WC  . darbepoetin (ARANESP) injection -  DIALYSIS  100 mcg Subcutaneous ONCE-1800  . feeding supplement (NEPRO CARB STEADY)  237 mL Oral TID BM  . HYDROmorphone  2 mg Oral TID  . methadone  5 mg Oral TID  . multivitamin  1 tablet Oral QHS  . ondansetron      . pantoprazole  40 mg Oral Daily  . ranolazine  500 mg Oral Daily  . sevelamer  800 mg Oral TID WC  . simvastatin  10 mg Oral q1800  . sodium chloride  3 mL Intravenous Q12H  . sodium chloride  3 mL Intravenous Q12H   sodium chloride, guaiFENesin-dextromethorphan, ondansetron (ZOFRAN) IV, ondansetron, sodium chloride  Assessment/ Plan:  ESRD- dialysis MWF  ANEMIA- significant drop in hemoglobin will give dose of  aranesp and check iron stores  MBD-calcium 7.9  ACCESS-Carheter site clean and dry   LOS: 2 Julianne Chamberlin W @TODAY @10 :32 AM

## 2012-04-03 NOTE — Progress Notes (Signed)
[facility] PROGRESS NOTE  [name]:[id number] DOB: 1951-02-26 DOA: 04/01/2012 PCP: [name], MD  Assessment/Plan: Hyperkalemia- s/p dialysis that she missed earlier- K+ back in normal range  Non sustained v tach- check Mg, add BB- does have a pacemaker and low EF- follows outpatient with a cardiologist  .Chronic systolic congestive heart failure, NYHA class 3- EF 20-25%- controlled with dialysis  fatigue ? Related to hyperkalemia, and anemia PT eval- has home health PT already, can walk but usually uses wheelchair  .Anemia in chronic kidney disease (CKD)- aranesp, no c/o blood in stool, no back pain  Hypertension: add low dose BB   Code Status: full Family Communication: patient at bedside Disposition Plan: hope to d/c tomm after dialysis   Consultants:  nephrology   HPI/Subjective: Feeling better today, not yet ready to go home  Objective: Filed Vitals:   04/02/12 1654 04/02/12 2141 04/03/12 0444 04/03/12 0900  BP: 135/89 142/87 129/87 140/87  Pulse: 88 86 81 89  Temp: 98.6 F (37 C) 97.8 F (36.6 C) 97.8 F (36.6 C) 97.8 F (36.6 C)  TempSrc: Oral  Oral Oral  Resp: 18 18 18 18   Height:      Weight:      SpO2: 98% 99% 98% 96%    Intake/Output Summary (Last 24 hours) at 04/03/12 1313 Last data filed at 04/03/12 0900  Gross per 24 hour  Intake    612 ml  Output     25 ml  Net    587 ml   Filed Weights   04/02/12 0434  Weight: 55.8 kg (123 lb 0.3 oz)    Exam:   General:  A+Ox3, NAD  Cardiovascular: rrr, mild edema in legs  Respiratory: clear  Abdomen: +BS, soft, NT  Data Reviewed: Basic Metabolic Panel:  Lab 04/03/12 8119 04/02/12 0710 04/01/12 1849 04/01/12 1630  NA 132* 135 -- 131*  K 4.0 4.4 6.4* 6.6*  CL 96 98 -- 93*  CO2 25 27 -- 21  GLUCOSE 239* 70 -- 89  BUN 25* 18 -- 54*  CREATININE 4.72* 3.76* -- 8.23*  CALCIUM 9.9 7.9* -- 10.4  MG -- 1.8 -- --  PHOS 3.4 -- -- --   Liver Function Tests:  Lab 04/03/12  1119 04/01/12 1630  AST -- 17  ALT -- 8  ALKPHOS -- 119*  BILITOT -- 0.5  PROT -- 7.8  ALBUMIN 2.1* 2.5*    Lab 04/01/12 1630  LIPASE 11  AMYLASE --   No results found for this basename: AMMONIA:5 in the last 168 hours CBC:  Lab 04/03/12 1119 04/02/12 0710 04/01/12 1630  WBC 15.5* 9.4 11.6*  NEUTROABS -- -- 9.3*  HGB 7.8* 7.2* 8.9*  HCT 26.4* 24.0* 30.0*  MCV 86.8 85.7 85.7  PLT 177 159 286   Cardiac Enzymes: No results found for this basename: CKTOTAL:5,CKMB:5,CKMBINDEX:5,TROPONINI:5 in the last 168 hours BNP (last 3 results)  Basename 03/02/12 1633 06/02/11 2034  PROBNP >70000.0* 31365.0*   CBG: No results found for this basename: GLUCAP:5 in the last 168 hours  Recent Results (from the past 240 hour(s))  MRSA PCR SCREENING     Status: Normal   Collection Time   04/02/12  7:27 AM      Component Value Range Status Comment   MRSA by PCR NEGATIVE  NEGATIVE Final      Studies: Dg Abd Acute W/chest  04/01/2012  *RADIOLOGY REPORT*  Clinical Data: Shortness of breath, cough, chronic pancreatitis  ACUTE ABDOMEN SERIES (ABDOMEN  2 VIEW & CHEST 1 VIEW)  Comparison: CT 02/23/2012, chest radiograph 02/23/2012  Findings: Left AICD in place.  Evidence of CABG.  Moderate enlargement of the cardiomediastinal silhouette is stable.  Central vascular congestion without overt edema.  Trace pleural effusions. No free air beneath the diaphragms.  The most inferior sternal wire is again noted to be discontinuous.  Cholecystectomy clips noted.  Vascular calcifications are noted. Normal visualized bowel gas pattern.  Left upper quadrant anastomotic chain sutures are noted.  No acute osseous abnormality.  IMPRESSION: Normal bowel gas pattern.  Trace bilateral pleural effusions and cardiomegaly.   Original Report Authenticated By: Harrel Lemon, M.D.     Scheduled Meds:    . sodium chloride   Intravenous STAT  . carvedilol  3.125 mg Oral BID WC  . darbepoetin (ARANESP) injection -  DIALYSIS  100 mcg Subcutaneous ONCE-1800  . feeding supplement (NEPRO CARB STEADY)  237 mL Oral TID BM  . HYDROmorphone  2 mg Oral TID  . methadone  5 mg Oral TID  . multivitamin  1 tablet Oral QHS  . ondansetron      . pantoprazole  40 mg Oral Daily  . ranolazine  500 mg Oral Daily  . sevelamer  800 mg Oral TID WC  . simvastatin  10 mg Oral q1800  . sodium chloride  3 mL Intravenous Q12H  . sodium chloride  3 mL Intravenous Q12H   Continuous Infusions:   Principal Problem:  *Hyperkalemia Active Problems:  Chronic systolic congestive heart failure, NYHA class 3- EF 20-25%  Hypertension  Pancreatitis, chronic  Anemia in chronic kidney disease (CKD)  Fatigue    Time spent: 35 min    [name]  [facility] Pager [phone] 04/03/2012, 1:13 PM  LOS: 2 days

## 2012-04-03 NOTE — ED Provider Notes (Signed)
History     CSN: [id number]  Arrival date & time 04/01/12  1453   First MD Initiated Contact with Patient 04/01/12 1509      Chief Complaint  Patient presents with  . Weakness    (Consider location/radiation/quality/duration/timing/severity/associated sxs/prior treatment) Patient is a 61 y.o. female presenting with leg pain. The history is provided by the patient.  Leg Pain  The incident occurred yesterday. There was no injury mechanism. Associated symptoms comments: She complains of soreness and generalized pain in the legs that make her feel she is going to fall. No falls reported. No swelling or redness, no fever. She reports dialyzing yesterday but that it was an "off day" and her usual schedule is M, W, F in [employer]. She denies SOB or chest pain. She denies swelling or redness of extremities. She states this is how she feels when dialysis is necessary..    Past Medical History  Diagnosis Date  . Gallstone pancreatitis   . Coronary artery disease     s/p CABG 2008 with multiple PCIs  . Ischemic cardiomyopathy   . End stage renal disease   . Diabetes mellitus   . Hypertension   . History of nonadherence to medical treatment   . LBBB (left bundle branch block)   . Hyperlipidemia   . Renal insufficiency   . CHF (congestive heart failure)   . Dialysis patient   . Anemia     Past Surgical History  Procedure Date  . Coronary artery bypass graft 2008  . Cholecystectomy   . Abdominal hysterectomy   . Tubal ligation   . Tonsillectomy   . Insert / replace / remove pacemaker     pacemaker ICD  . Esophagogastroduodenoscopy 09/15/2011    Procedure: ESOPHAGOGASTRODUODENOSCOPY (EGD);  Surgeon: Theda Belfast, MD;  Location: [facility] ENDOSCOPY;  Service: Endoscopy;  Laterality: N/A;    Family History  Problem Relation Age of Onset  . Coronary artery disease Mother   . Hypertension Mother   . Diabetes Mother   . Diabetes Sister   . Anesthesia problems Neg Hx     History    Substance Use Topics  . Smoking status: Former Smoker    Quit date: 06/15/2000  . Smokeless tobacco: Not on file  . Alcohol Use: No    OB History    Grav Para Term Preterm Abortions TAB SAB Ect Mult Living                  Review of Systems  Constitutional: Negative for fever.  Eyes: Negative for visual disturbance.  Respiratory: Negative for shortness of breath.   Cardiovascular: Negative for chest pain.  Gastrointestinal: Negative for nausea and abdominal pain.  Genitourinary: Negative for dysuria.  Musculoskeletal: Positive for myalgias. Negative for back pain.  Neurological: Positive for weakness. Negative for headaches.  Psychiatric/Behavioral: Negative for confusion.    Allergies  Morphine and related; Codeine; and Lisinopril  Home Medications  No current outpatient prescriptions on file.  BP 140/87  Pulse 89  Temp 97.8 F (36.6 C) (Oral)  Resp 18  Ht 5\' 1"  (1.549 m)  Wt 123 lb 0.3 oz (55.8 kg)  BMI 23.24 kg/m2  SpO2 96%  Physical Exam  Constitutional: She is oriented to person, place, and time. She appears well-developed and well-nourished.  HENT:  Head: Normocephalic.  Neck: Normal range of motion. Neck supple.  Cardiovascular: Normal rate and regular rhythm.   Pulmonary/Chest: Effort normal and breath sounds normal. She has no wheezes.  She has no rales. She exhibits no tenderness.       Dialysis catheter in right chest. Site unremarkable.  Abdominal: Soft. Bowel sounds are normal. There is no tenderness. There is no rebound and no guarding.  Musculoskeletal: Normal range of motion. She exhibits no edema.  Neurological: She is alert and oriented to person, place, and time.  Skin: Skin is warm and dry. No rash noted.  Psychiatric: She has a normal mood and affect.    ED Course  Procedures (including critical care time)  Labs Reviewed  CBC WITH DIFFERENTIAL - Abnormal; Notable for the following:    WBC 11.6 (*)     RBC 3.50 (*)     Hemoglobin 8.9  (*)     HCT 30.0 (*)     MCH 25.4 (*)     MCHC 29.7 (*)     RDW 18.8 (*)     Neutrophils Relative 80 (*)     Neutro Abs 9.3 (*)     All other components within normal limits  COMPREHENSIVE METABOLIC PANEL - Abnormal; Notable for the following:    Sodium 131 (*)     Potassium 6.6 (*)     Chloride 93 (*)     BUN 54 (*)     Creatinine, Ser 8.23 (*)     Albumin 2.5 (*)     Alkaline Phosphatase 119 (*)     GFR calc non Af Amer 5 (*)     GFR calc Af Amer 5 (*)     All other components within normal limits  POTASSIUM - Abnormal; Notable for the following:    Potassium 6.4 (*)     All other components within normal limits  BASIC METABOLIC PANEL - Abnormal; Notable for the following:    Creatinine, Ser 3.76 (*)  DELTA CHECK NOTED   Calcium 7.9 (*)     GFR calc non Af Amer 12 (*)     GFR calc Af Amer 14 (*)     All other components within normal limits  CBC - Abnormal; Notable for the following:    RBC 2.80 (*)     Hemoglobin 7.2 (*)     HCT 24.0 (*)     MCH 25.7 (*)     RDW 18.7 (*)     All other components within normal limits  URINALYSIS, MICROSCOPIC ONLY - Abnormal; Notable for the following:    APPearance CLOUDY (*)     pH 8.5 (*)     Protein, ur 100 (*)     Leukocytes, UA TRACE (*)     Bacteria, UA FEW (*)     Squamous Epithelial / LPF MANY (*)     All other components within normal limits  RENAL FUNCTION PANEL - Abnormal; Notable for the following:    Sodium 132 (*)     Glucose, Bld 239 (*)     BUN 25 (*)     Creatinine, Ser 4.72 (*)     Albumin 2.1 (*)     GFR calc non Af Amer 9 (*)     GFR calc Af Amer 10 (*)     All other components within normal limits  CBC - Abnormal; Notable for the following:    WBC 15.5 (*)     RBC 3.04 (*)     Hemoglobin 7.8 (*)     HCT 26.4 (*)     MCH 25.7 (*)     MCHC 29.5 (*)  RDW 18.7 (*)     All other components within normal limits  LIPASE, BLOOD  MRSA PCR SCREENING  MAGNESIUM  URINALYSIS, ROUTINE W REFLEX MICROSCOPIC    OCCULT BLOOD X 1 CARD TO LAB, STOOL   Dg Abd Acute W/chest  04/01/2012  *RADIOLOGY REPORT*  Clinical Data: Shortness of breath, cough, chronic pancreatitis  ACUTE ABDOMEN SERIES (ABDOMEN 2 VIEW & CHEST 1 VIEW)  Comparison: CT 02/23/2012, chest radiograph 02/23/2012  Findings: Left AICD in place.  Evidence of CABG.  Moderate enlargement of the cardiomediastinal silhouette is stable.  Central vascular congestion without overt edema.  Trace pleural effusions. No free air beneath the diaphragms.  The most inferior sternal wire is again noted to be discontinuous.  Cholecystectomy clips noted.  Vascular calcifications are noted. Normal visualized bowel gas pattern.  Left upper quadrant anastomotic chain sutures are noted.  No acute osseous abnormality.  IMPRESSION: Normal bowel gas pattern.  Trace bilateral pleural effusions and cardiomegaly.   Original Report Authenticated By: Harrel Lemon, M.D.      1. Renal failure   2. CKD (chronic kidney disease) stage 5, GFR less than 15 ml/min   3. Hyperkalemia   4. Hypertension   5. Anemia in chronic kidney disease (CKD)   6. Chronic systolic congestive heart failure, NYHA class 3   7. Fatigue       MDM  Pain is addressed and managed in ED. Discussed elevated potassium with Dr. Hyman Hopes ([facility]) who recommends admission for dialysis. [facility] paged for admission. Patient remains stable.         Rodena Medin, PA-C 04/08/12 [phone]

## 2012-04-03 NOTE — Progress Notes (Signed)
Physical Therapy Evaluation Patient Details Name: [name] MRN: [id number] DOB: 1950/12/04 Today's Date: 04/03/2012 Time: 3086-5784 PT Time Calculation (min): 22 min  PT Assessment / Plan / Recommendation Clinical Impression  61 yo female admitted with weakness/hyperkalemia presents with decr functional mobiltiy; will benefit from acute PT to maximize independence and safety  with mobility and enable safe dc home with HHPT follow-up    PT Assessment  Patient needs continued PT services    Follow Up Recommendations  Home health PT    Does the patient have the potential to tolerate intense rehabilitation      Barriers to Discharge Decreased caregiver support      Equipment Recommendations  None recommended by PT    Recommendations for Other Services OT consult   Frequency Min 3X/week    Precautions / Restrictions Precautions Precautions: Fall   Pertinent Vitals/Pain no apparent distress       Mobility  Bed Mobility Bed Mobility: Supine to Sit;Sitting - Scoot to Edge of Bed Supine to Sit: 5: Supervision Sitting - Scoot to Edge of Bed: 5: Supervision Details for Bed Mobility Assistance: Pretty smooth transitions Transfers Transfers: Sit to Stand;Stand to Sit Sit to Stand: 4: Min guard;With upper extremity assist;From bed (without physical contact) Stand to Sit: 4: Min guard;To chair/3-in-1;With upper extremity assist;With armrests (without physical contact) Details for Transfer Assistance: Pt demonstrated enough strength to come to stand without physical assist (though dependent on UE assist); Will need to assess serial sit to/from stands Ambulation/Gait Ambulation/Gait Assistance: 4: Min guard (without physical contact) Ambulation Distance (Feet): 75 Feet Assistive device: Rolling walker Ambulation/Gait Assistance Details: Cues for upright posture, and to self-monitor for acitivty tol Gait Pattern: Step-through pattern    Shoulder Instructions       Exercises     PT Diagnosis: Difficulty walking;Generalized weakness  PT Problem List: Decreased strength;Decreased activity tolerance;Decreased mobility;Decreased knowledge of use of DME;Decreased safety awareness;Decreased knowledge of precautions PT Treatment Interventions: DME instruction;Gait training;Stair training;Functional mobility training;Therapeutic activities;Therapeutic exercise;Patient/family education   PT Goals Acute Rehab PT Goals PT Goal Formulation: With patient Time For Goal Achievement: 04/10/12 Potential to Achieve Goals: Good Pt will go Supine/Side to Sit: with modified independence PT Goal: Supine/Side to Sit - Progress: Goal set today Pt will go Sit to Supine/Side: with modified independence PT Goal: Sit to Supine/Side - Progress: Goal set today Pt will go Sit to Stand: with modified independence PT Goal: Sit to Stand - Progress: Goal set today Pt will go Stand to Sit: with modified independence PT Goal: Stand to Sit - Progress: Goal set today Pt will Ambulate: >150 feet;with modified independence;with rolling walker PT Goal: Ambulate - Progress: Goal set today Pt will Go Up / Down Stairs: 1-2 stairs;with modified independence;with rail(s) PT Goal: Up/Down Stairs - Progress: Goal set today  Visit Information  Last PT Received On: 04/03/12 Assistance Needed: +1    Subjective Data  Subjective: Reports feels weak, but very willing to walk Patient Stated Goal: home   Prior Functioning  Home Living Lives With: Alone Available Help at Discharge: Other (Comment) (Need clarification) Type of Home: Apartment Home Access: Level entry Home Layout: One level Home Adaptive Equipment: Bedside commode/3-in-1;Walker - rolling Additional Comments: Need to verify home situation and adaptive equipment; Pt was quite distractable during eval Prior Function Level of Independence: Independent with assistive device(s) Driving: No Communication Communication: No  difficulties    Cognition  Overall Cognitive Status: Appears within functional limits for tasks assessed/performed (very talkative to  the point of slight distraction) Arousal/Alertness: Awake/alert Orientation Level: Appears intact for tasks assessed Behavior During Session: [facility] for tasks performed    Extremity/Trunk Assessment Right Upper Extremity Assessment RUE ROM/Strength/Tone: Within functional levels Left Upper Extremity Assessment LUE ROM/Strength/Tone: Within functional levels Right Lower Extremity Assessment RLE ROM/Strength/Tone: Deficits RLE ROM/Strength/Tone Deficits: Generally weak; dependent on UE push for successful sit to stand Left Lower Extremity Assessment LLE ROM/Strength/Tone: Deficits LLE ROM/Strength/Tone Deficits: Generally weak; dependent on UE push for successful sit to stand Trunk Assessment Trunk Assessment: Normal   Balance    End of Session PT - End of Session Equipment Utilized During Treatment: Gait belt Activity Tolerance: Patient tolerated treatment well Patient left: in chair;with call bell/phone within reach Nurse Communication: Mobility status  GP Functional Assessment Tool Used: Clinical Judgement Functional Limitation: Mobility: Walking and moving around Mobility: Walking and Moving Around Current Status ([id number]): At least 1 percent but less than 20 percent impaired, limited or restricted Mobility: Walking and Moving Around Goal Status [phone]): 0 percent impaired, limited or restricted   Van Clines [facility] [place], [place] [phone]  04/03/2012, 1:54 PM

## 2012-04-04 ENCOUNTER — Observation Stay (HOSPITAL_COMMUNITY): Payer: Non-veteran care

## 2012-04-04 DIAGNOSIS — D72829 Elevated white blood cell count, unspecified: Secondary | ICD-10-CM

## 2012-04-04 LAB — CBC
HCT: 24.8 % — ABNORMAL LOW (ref 36.0–46.0)
MCV: 87.3 fL (ref 78.0–100.0)
Platelets: 196 10*3/uL (ref 150–400)
RBC: 2.84 MIL/uL — ABNORMAL LOW (ref 3.87–5.11)
WBC: 11.5 10*3/uL — ABNORMAL HIGH (ref 4.0–10.5)

## 2012-04-04 LAB — RENAL FUNCTION PANEL
CO2: 27 mEq/L (ref 19–32)
Chloride: 94 mEq/L — ABNORMAL LOW (ref 96–112)
Creatinine, Ser: 5.35 mg/dL — ABNORMAL HIGH (ref 0.50–1.10)
GFR calc non Af Amer: 8 mL/min — ABNORMAL LOW (ref >90)

## 2012-04-04 LAB — PREPARE RBC (CROSSMATCH)

## 2012-04-04 LAB — HEPATITIS B SURFACE ANTIGEN: Hepatitis B Surface Ag: NEGATIVE

## 2012-04-04 MED ORDER — ALBUTEROL SULFATE (5 MG/ML) 0.5% IN NEBU: 2.5000 mg | INHALATION_SOLUTION | RESPIRATORY_TRACT | Status: DC | PRN

## 2012-04-04 MED ORDER — ONDANSETRON HCL 4 MG/2ML IJ SOLN
INTRAMUSCULAR | Status: AC
  Administered 2012-04-04: 4 mg via INTRAVENOUS
  Filled 2012-04-04: qty 2

## 2012-04-04 MED ORDER — DARBEPOETIN ALFA-POLYSORBATE 100 MCG/0.5ML IJ SOLN
100.0000 ug | INTRAMUSCULAR | Status: DC
  Administered 2012-04-04: 100 ug via INTRAVENOUS
  Filled 2012-04-04: qty 0.5

## 2012-04-04 MED ORDER — HEPARIN SODIUM (PORCINE) 1000 UNIT/ML IJ SOLN
1200.0000 [IU] | Freq: Once | INTRAMUSCULAR | Status: AC
  Administered 2012-04-04: 1200 [IU] via INTRAVENOUS

## 2012-04-04 MED ORDER — DARBEPOETIN ALFA-POLYSORBATE 100 MCG/0.5ML IJ SOLN
INTRAMUSCULAR | Status: AC
  Administered 2012-04-04: 100 ug via INTRAVENOUS
  Filled 2012-04-04: qty 0.5

## 2012-04-04 NOTE — Progress Notes (Signed)
[facility] PROGRESS NOTE  [name] ZOX:[id number] DOB: [birth year] DOA: 04/01/2012 PCP: [name], MD  Assessment/Plan: Hyperkalemia- s/p dialysis that she missed earlier- K+ back in normal range  Non sustained v tach- check Mg, add BB- does have a pacemaker and low EF- follows outpatient with a cardiologist  .Chronic systolic congestive heart failure, NYHA class 3- EF 20-25%- controlled with dialysis  fatigue ? Related to hyperkalemia, and anemia PT eval- has home health PT already, can walk but usually uses wheelchair  .Anemia in chronic kidney disease (CKD)- aranesp, no c/o blood in stool, no back pain- defer to nephro  Hypertension: add low dose BB  Leukocytosis- ?PNA, chest xray, breathing treatments, abx if x ray positive   Code Status: full Family Communication: patient at bedside Disposition Plan: hope to d/c tomm after dialysis   Consultants:  nephrology   HPI/Subjective: +cough No fever, no chills   Objective: Filed Vitals:   04/03/12 1300 04/03/12 1740 04/03/12 2150 04/04/12 0559  BP: 133/95 126/82 123/83 131/74  Pulse: 80 76 76 74  Temp: 97.7 F (36.5 C) 97.8 F (36.6 C) 98.1 F (36.7 C) 98.6 F (37 C)  TempSrc: Oral Oral Oral Oral  Resp: 17 18 18 18   Height:      Weight:   56.9 kg (125 lb 7.1 oz)   SpO2: 95% 98% 99% 95%    Intake/Output Summary (Last 24 hours) at 04/04/12 0810 Last data filed at 04/04/12 8119  Gross per 24 hour  Intake    813 ml  Output      0 ml  Net    813 ml   Filed Weights   04/02/12 0434 04/03/12 2150  Weight: 55.8 kg (123 lb 0.3 oz) 56.9 kg (125 lb 7.1 oz)    Exam:   General:  A+Ox3, NAD  Cardiovascular: rrr, mild edema in legs  Respiratory: clear  Abdomen: +BS, soft, NT  Data Reviewed: Basic Metabolic Panel:  Lab 04/03/12 1478 04/02/12 0710 04/01/12 1849 04/01/12 1630  NA 132* 135 -- 131*  K 4.0 4.4 6.4* 6.6*  CL 96 98 -- 93*  CO2 25 27 -- 21  GLUCOSE 239* 70 -- 89  BUN 25* 18 --  54*  CREATININE 4.72* 3.76* -- 8.23*  CALCIUM 9.9 7.9* -- 10.4  MG -- 1.8 -- --  PHOS 3.4 -- -- --   Liver Function Tests:  Lab 04/03/12 1119 04/01/12 1630  AST -- 17  ALT -- 8  ALKPHOS -- 119*  BILITOT -- 0.5  PROT -- 7.8  ALBUMIN 2.1* 2.5*    Lab 04/01/12 1630  LIPASE 11  AMYLASE --   No results found for this basename: AMMONIA:5 in the last 168 hours CBC:  Lab 04/03/12 1119 04/02/12 0710 04/01/12 1630  WBC 15.5* 9.4 11.6*  NEUTROABS -- -- 9.3*  HGB 7.8* 7.2* 8.9*  HCT 26.4* 24.0* 30.0*  MCV 86.8 85.7 85.7  PLT 177 159 286   Cardiac Enzymes: No results found for this basename: CKTOTAL:5,CKMB:5,CKMBINDEX:5,TROPONINI:5 in the last 168 hours BNP (last 3 results)  Basename 03/02/12 1633 06/02/11 2034  PROBNP >70000.0* 31365.0*   CBG: No results found for this basename: GLUCAP:5 in the last 168 hours  Recent Results (from the past 240 hour(s))  MRSA PCR SCREENING     Status: Normal   Collection Time   04/02/12  7:27 AM      Component Value Range Status Comment   MRSA by PCR NEGATIVE  NEGATIVE Final  Studies: No results found.  Scheduled Meds:    . carvedilol  3.125 mg Oral BID WC  . darbepoetin (ARANESP) injection - DIALYSIS  100 mcg Subcutaneous ONCE-1800  . feeding supplement (NEPRO CARB STEADY)  237 mL Oral TID BM  . heparin subcutaneous  5,000 Units Subcutaneous Q8H  . HYDROmorphone  2 mg Oral TID  . methadone  5 mg Oral TID  . multivitamin  1 tablet Oral QHS  . pantoprazole  40 mg Oral Daily  . ranolazine  500 mg Oral Daily  . sevelamer  800 mg Oral TID WC  . simvastatin  10 mg Oral q1800  . sodium chloride  3 mL Intravenous Q12H  . sodium chloride  3 mL Intravenous Q12H   Continuous Infusions:   Principal Problem:  *Hyperkalemia Active Problems:  Chronic systolic congestive heart failure, NYHA class 3- EF 20-25%  Hypertension  Pancreatitis, chronic  Anemia in chronic kidney disease (CKD)  Fatigue    Time spent: 35  min    Benjamine Mola Camelle Henkels  [facility] Pager [phone] 04/04/2012, 8:10 AM  LOS: 3 days

## 2012-04-04 NOTE — Progress Notes (Signed)
. Subjective:  Feeling better but legs are not 100% back to normal, HD for today Objective Vital signs in last 24 hours: Filed Vitals:   04/03/12 1300 04/03/12 1740 04/03/12 2150 04/04/12 0559  BP: 133/95 126/82 123/83 131/74  Pulse: 80 76 76 74  Temp: 97.7 F (36.5 C) 97.8 F (36.6 C) 98.1 F (36.7 C) 98.6 F (37 C)  TempSrc: Oral Oral Oral Oral  Resp: 17 18 18 18   Height:      Weight:   56.9 kg (125 lb 7.1 oz)   SpO2: 95% 98% 99% 95%   Weight change:   Intake/Output Summary (Last 24 hours) at 04/04/12 0818 Last data filed at 04/04/12 1610  Gross per 24 hour  Intake    813 ml  Output      0 ml  Net    813 ml   Labs: Basic Metabolic Panel:  Lab 04/03/12 9604 04/02/12 0710 04/01/12 1849 04/01/12 1630  NA 132* 135 -- 131*  K 4.0 4.4 6.4* --  CL 96 98 -- 93*  CO2 25 27 -- 21  GLUCOSE 239* 70 -- 89  BUN 25* 18 -- 54*  CREATININE 4.72* 3.76* -- 8.23*  CALCIUM 9.9 7.9* -- 10.4  ALB -- -- -- --  PHOS 3.4 -- -- --   Liver Function Tests:  Lab 04/03/12 1119 04/01/12 1630  AST -- 17  ALT -- 8  ALKPHOS -- 119*  BILITOT -- 0.5  PROT -- 7.8  ALBUMIN 2.1* 2.5*    Lab 04/01/12 1630  LIPASE 11  AMYLASE --   No results found for this basename: AMMONIA:3 in the last 168 hours CBC:  Lab 04/03/12 1119 04/02/12 0710 04/01/12 1630  WBC 15.5* 9.4 11.6*  NEUTROABS -- -- 9.3*  HGB 7.8* 7.2* 8.9*  HCT 26.4* 24.0* 30.0*  MCV 86.8 85.7 85.7  PLT 177 159 286   Cardiac Enzymes: No results found for this basename: CKTOTAL:5,CKMB:5,CKMBINDEX:5,TROPONINI:5 in the last 168 hours CBG: No results found for this basename: GLUCAP:5 in the last 168 hours  Iron Studies: No results found for this basename: IRON,TIBC,TRANSFERRIN,FERRITIN in the last 72 hours Studies/Results: No results found. Medications:      . carvedilol  3.125 mg Oral BID WC  . darbepoetin (ARANESP) injection - DIALYSIS  100 mcg Subcutaneous ONCE-1800  . feeding supplement (NEPRO CARB STEADY)  237 mL Oral  TID BM  . heparin subcutaneous  5,000 Units Subcutaneous Q8H  . HYDROmorphone  2 mg Oral TID  . methadone  5 mg Oral TID  . multivitamin  1 tablet Oral QHS  . pantoprazole  40 mg Oral Daily  . ranolazine  500 mg Oral Daily  . sevelamer  800 mg Oral TID WC  . simvastatin  10 mg Oral q1800  . sodium chloride  3 mL Intravenous Q12H  . sodium chloride  3 mL Intravenous Q12H   I  have reviewed scheduled and prn medications.  Physical Exam: General: Alert, elderly bf NAD, finishing breakfast Heart: RRR, no rub, or murmur, pacemaker in left upper chest Lungs: faint right rhonchi clear wit cough Abdomen: soft, nontebnder Extremities: Dialysis Access: trace bipedal edema/perm cath right ij,    Out pt. HD TX= [facility] pt.= MWF/ EDW= 54kg/ 2.0 k, 2.5 CA  [place]/ 4hrs./ 350 bfr/ 700 dfr/13,200 units epo q hd/ 0 iron / no  Zemplar  Problem/Plan: 1. Hyperkalemia, resolved-  missed hd Wed 03/30/12 per her kidney center and she signs off early also/ diet  noncompliance " ate some tomatoes in spaghetti " 2. ESRD -MWF in [place] / hd today on schedule, fu wts bp / Informs me " for [facility] this Friday [facility]"  edw=119 lbs =54 kg / yesterday wt =56.9kgwt 3. Leukocytosis+ admit team checking CXR this am,  Remote smoker 4. Fatigue /Leg weakness= multi factorial = Hyperkalemia./ Anemia/ Missed last 2 HD TXs/Chronic pain "taking  Dilaudid and Hydromorphine for Chronic Pancreatitis and Varices pain since June this year" 5. Anemia -8.9> 7.2> 7.8 hgb yesterday for transfusion 2 u prbcs on hd today IF HGB Dropping Further./Start Aranesp 100 mcg/ and no iron with Ferritin = 2657 with  Tfs=14% 6. Secondary hyperparathyroidism - Ca=9.9 phos+3.4 on Renvela/  No  zemplar 7. HTN/volume - Trace edma / am bp=131/74 on Coreg mainly for CM/ attempt  4 to 4.5 kg uf 8. CM  ( with ho ef 20%)= hd today , no sob 9. Dispo- possible d/c after HD Tuesday per primary  Lenny Pastel, PA-C [facility] [phone] 04/04/2012,8:18 AM  LOS: 3 days   Patient seen and examined and agree with assessment and plan as above.  Vinson Moselle  MD [facility] [phone] pgr    [phone] cell 04/04/2012, 11:09 AM

## 2012-04-04 NOTE — Progress Notes (Signed)
Utilization review completed.  

## 2012-04-05 LAB — TYPE AND SCREEN
ABO/RH(D): O POS
DAT, IgG: POSITIVE
Unit division: 0

## 2012-04-05 MED ORDER — HEPARIN SODIUM (PORCINE) 1000 UNIT/ML DIALYSIS
20.0000 [IU]/kg | INTRAMUSCULAR | Status: DC | PRN
  Filled 2012-04-05: qty 2

## 2012-04-05 MED ORDER — CARVEDILOL 3.125 MG PO TABS: 3.1250 mg | ORAL_TABLET | Freq: Two times a day (BID) | ORAL | Status: DC

## 2012-04-05 MED ORDER — DARBEPOETIN ALFA-POLYSORBATE 100 MCG/0.5ML IJ SOLN: 100.0000 ug | INTRAMUSCULAR | Status: DC

## 2012-04-05 NOTE — Progress Notes (Signed)
D/C instructions given to pt. IV d/c'd,cath tip intact. Prescription given to pt. All belongings sent with pt.

## 2012-04-05 NOTE — Discharge Summary (Signed)
Physician Discharge Summary  [name] EAV:[id number] DOB: 23-Jul-1950 DOA: 04/01/2012  PCP: [name], MD  Admit date: 04/01/2012 Discharge date: 04/05/2012  Time spent: 35 minutes  Recommendations for Outpatient Follow-up:  1. Home health- PT/RN 2. HD as per nephro  Discharge Diagnoses:  Principal Problem:  *Hyperkalemia Active Problems:  Chronic systolic congestive heart failure, NYHA class 3- EF 20-25%  Hypertension  Pancreatitis, chronic  Anemia in chronic kidney disease (CKD)  Fatigue   Discharge Condition: improved  Diet recommendation: renal  Filed Weights   04/02/12 0434 04/03/12 2150 04/04/12 1513  Weight: 55.8 kg (123 lb 0.3 oz) 56.9 kg (125 lb 7.1 oz) 53.5 kg (117 lb 15.1 oz)    History of present illness:  [name] is an 61 y.o. female with ESRD on HD in [place], missed her dialysis today, presents to the ER feeling weak, but no SOB, CP, N/V or diarrhea. Evaluation in the ER showed WBC of 11.6 and Hb of 9.8, but her K is 6.1 and EKG showed no peak T waves. Hospitalist was asked to admit her into OBS for urgent dialysis.    Hospital Course:  Hyperkalemia- s/p dialysis that she missed earlier- K+ back in normal range   Non sustained v tach- check Mg, add BB- does have a pacemaker and low EF- follows outpatient with a cardiologist   .Chronic systolic congestive heart failure, NYHA class 3- EF 20-25%- controlled with dialysis   fatigue ? Related to hyperkalemia, and anemia PT eval- has home health PT already, can walk but usually uses wheelchair   .Anemia in chronic kidney disease (CKD)- aranesp, no c/o blood in stool, no back pain- defer to nephro  Hypertension: add low dose BB   Leukocytosis- x ray only showed fluid no PNA, cough resolved after dialysis    Consultations:  nephro  Discharge Exam: Filed Vitals:   04/04/12 1925 04/04/12 2020 04/04/12 2105 04/05/12 1000  BP: 139/55 107/81 119/69 119/74  Pulse: 72 71 70 75  Temp: 98.4 F  (36.9 C) 98.6 F (37 C) 98.3 F (36.8 C) 98.3 F (36.8 C)  TempSrc: Oral Oral Oral Oral  Resp: 16 18 16 18   Height:      Weight:      SpO2:    97%    General: A+Ox3, NAD Cardiovascular: rrr, no edema Respiratory: clear, no wheezing  Discharge Instructions      Discharge Orders    Future Orders Please Complete By Expires   Increase activity slowly      Discharge instructions      Comments:   Home health Continue with dialysis per nephro Renal diet       Medication List     As of 04/05/2012  1:25 PM    TAKE these medications         carvedilol 3.125 MG tablet   Commonly known as: COREG   Take 1 tablet (3.125 mg total) by mouth 2 (two) times daily with a meal.      darbepoetin 100 MCG/0.5ML Soln   Commonly known as: ARANESP   Inject 0.5 mLs (100 mcg total) into the vein every Monday with hemodialysis.      feeding supplement (NEPRO CARB STEADY) Liqd   Take 237 mLs by mouth 3 (three) times daily between meals.      HYDROmorphone 2 MG tablet   Commonly known as: DILAUDID   Take 1 tablet (2 mg total) by mouth 3 (three) times daily. Takes along with methadone.  methadone 5 MG tablet   Commonly known as: DOLOPHINE   Take 1 tablet (5 mg total) by mouth 3 (three) times daily. Takes along with Dilaudid.      multivitamin Tabs tablet   Take 1 tablet by mouth daily.      pantoprazole 40 MG tablet   Commonly known as: PROTONIX   Take 40 mg by mouth daily.      pravastatin 20 MG tablet   Commonly known as: PRAVACHOL   Take 20 mg by mouth at bedtime.      ranolazine 500 MG 12 hr tablet   Commonly known as: RANEXA   Take 500 mg by mouth daily.      RENAGEL 800 MG tablet   Generic drug: sevelamer   Take 800 mg by mouth 3 (three) times daily with meals.           The results of significant diagnostics from this hospitalization (including imaging, microbiology, ancillary and laboratory) are listed below for reference.    Significant Diagnostic  Studies: Dg Chest Port 1 View  04/04/2012  *RADIOLOGY REPORT*  Clinical Data: Cough, renal failure, diabetes, CHF  PORTABLE CHEST - 1 VIEW  Comparison: 03/02/2012  Findings: Stable right IJ dialysis catheter tips in the proximal right atrium.  Left subclavian AICD / pacer noted.  Prior coronary bypass evident.  Heart remains enlarged with mild vascular congestion centrally.  No definite focal pneumonia, collapse, consolidation, large effusion or pneumothorax.  Trachea midline. Atherosclerosis of the aorta.  IMPRESSION: Cardiomegaly with vascular congestion.   Original Report Authenticated By: Judie Petit. Ruel Favors, M.D.    Dg Abd Acute W/chest  04/01/2012  *RADIOLOGY REPORT*  Clinical Data: Shortness of breath, cough, chronic pancreatitis  ACUTE ABDOMEN SERIES (ABDOMEN 2 VIEW & CHEST 1 VIEW)  Comparison: CT 02/23/2012, chest radiograph 02/23/2012  Findings: Left AICD in place.  Evidence of CABG.  Moderate enlargement of the cardiomediastinal silhouette is stable.  Central vascular congestion without overt edema.  Trace pleural effusions. No free air beneath the diaphragms.  The most inferior sternal wire is again noted to be discontinuous.  Cholecystectomy clips noted.  Vascular calcifications are noted. Normal visualized bowel gas pattern.  Left upper quadrant anastomotic chain sutures are noted.  No acute osseous abnormality.  IMPRESSION: Normal bowel gas pattern.  Trace bilateral pleural effusions and cardiomegaly.   Original Report Authenticated By: Harrel Lemon, M.D.     Microbiology: Recent Results (from the past 240 hour(s))  MRSA PCR SCREENING     Status: Normal   Collection Time   04/02/12  7:27 AM      Component Value Range Status Comment   MRSA by PCR NEGATIVE  NEGATIVE Final      Labs: Basic Metabolic Panel:  Lab 04/04/12 1610 04/03/12 1119 04/02/12 0710 04/01/12 1849 04/01/12 1630  NA 130* 132* 135 -- 131*  K 4.1 4.0 4.4 6.4* 6.6*  CL 94* 96 98 -- 93*  CO2 27 25 27  -- 21    GLUCOSE 142* 239* 70 -- 89  BUN 30* 25* 18 -- 54*  CREATININE 5.35* 4.72* 3.76* -- 8.23*  CALCIUM 10.1 9.9 7.9* -- 10.4  MG -- -- 1.8 -- --  PHOS 4.1 3.4 -- -- --   Liver Function Tests:  Lab 04/04/12 1120 04/03/12 1119 04/01/12 1630  AST -- -- 17  ALT -- -- 8  ALKPHOS -- -- 119*  BILITOT -- -- 0.5  PROT -- -- 7.8  ALBUMIN 2.1* 2.1* 2.5*  Lab 04/01/12 1630  LIPASE 11  AMYLASE --   No results found for this basename: AMMONIA:5 in the last 168 hours CBC:  Lab 04/04/12 1120 04/03/12 1119 04/02/12 0710 04/01/12 1630  WBC 11.5* 15.5* 9.4 11.6*  NEUTROABS -- -- -- 9.3*  HGB 7.4* 7.8* 7.2* 8.9*  HCT 24.8* 26.4* 24.0* 30.0*  MCV 87.3 86.8 85.7 85.7  PLT 196 177 159 286   Cardiac Enzymes: No results found for this basename: CKTOTAL:5,CKMB:5,CKMBINDEX:5,TROPONINI:5 in the last 168 hours BNP: BNP (last 3 results)  Basename 03/02/12 1633 06/02/11 2034  PROBNP >70000.0* 31365.0*   CBG: No results found for this basename: GLUCAP:5 in the last 168 hours     Signed:  Marlin Canary  [facility] 04/05/2012, 1:25 PM

## 2012-04-05 NOTE — Progress Notes (Signed)
   CARE MANAGEMENT NOTE 04/05/2012  Patient:  [name], [name]   Account Number:  [id number]  Date Initiated:  04/05/2012  Documentation initiated by:  Darlyne Russian  Subjective/Objective Assessment:   Patient admitted with weakness     Action/Plan:   Progression of care and discharge planning   Anticipated DC Date:  04/05/2012   Anticipated DC Plan:  HOME W HOME HEALTH SERVICES      DC Planning Services  CM consult      Choice offered to / List presented to:          [facility] arranged  HH-1 RN  HH-2 PT      [facility] agency  [facility]   Status of service:  In process, will continue to follow Medicare Important Message given?   (If response is "NO", the following Medicare IM given date fields will be blank) Date Medicare IM given:   Date Additional Medicare IM given:    Discharge Disposition:  HOME W HOME HEALTH SERVICES  Per UR Regulation:    If discussed at Long Length of Stay Meetings, dates discussed:    Comments:  04/05/2012  [address] RN, [place]  [phone] CM referral:  home health RN, PT  Met with patient regarding home health services. Prior to admission she had RN, PT with [facility] health and wants to resume services.  [facility]:  [id number]  Spoke with Carney Bern regarding plan dc for today and resume RN and PT FAX:  [phone] home health orders , [place], DC summary

## 2012-04-05 NOTE — Progress Notes (Signed)
[facility] Progress Note  Subjective: Doesn't feel like she has enough fluid taken off at her outpt HD center.  Glad to be able to mover her ankles again.  Objective Filed Vitals:   04/04/12 1925 04/04/12 2020 04/04/12 2105 04/05/12 1000  BP: 139/55 107/81 119/69 119/74  Pulse: 72 71 70 75  Temp: 98.4 F (36.9 C) 98.6 F (37 C) 98.3 F (36.8 C) 98.3 F (36.8 C)  TempSrc: Oral Oral Oral Oral  Resp: 16 18 16 18   Height:      Weight:      SpO2:    97%   Physical Exam General: comfortable Heart: RRR Lungs: no wheezes or rales Abdomen: soft NT Extremities: no LE edema Dialysis Access: right I-J  Out pt. HD TX= [place] [facility]= MWF/ EDW= 54kg/ 2.0 k, 2.5 CA Bath/ 4hrs./ 350 bfr/ 700 dfr/13,200 units epo q hd/ 0 iron / no Zemplar   Assessment/Plan: 1. Hyperkalemia, resolved- missed hd Wed 03/30/12 per her kidney center and she signs off early also/ diet noncompliance " 2. ESRD -MWF in [place] /  for Avgg this Friday [facility]" edw=119 lbs =54 kg / Net UF Monday 4.8 with post wt 53.5  3. Leukocytosis+ admit team checking CXR vasc cong pre HD, Remote smoker 4. Fatigue /Leg weakness= multi factorial = Hyperkalemia./ Anemia/ Missed last 2 HD TXs/Chronic pain "taking Dilaudid and Hydromorphine for Chronic Pancreatitis and Varices pain since June this year" 5. Anemia - hgb 7.4  transfused 2 u prbcs  Monday; Start Aranesp 100 mcg/ and no iron with Ferritin = 2657 with Tfs=14%; with UF of 4.8 AND 2 U PRBC Hgb should be much improved 6. Secondary hyperparathyroidism - Corr Ca 11.1 - needs lower Ca bath - change to 2.25 if still here Wed. on Renvela/ No zemplar 7. HTN/volume - BP controlled with decrease in volume; on Coreg mainly for CM/ CM ( with ho ef 20%) 8. Dispo- possible d/c Tuesday  - HD orders written for Wed if not d/c  Sheffield Slider, PA-C [facility] Beeper [phone]  04/05/2012,11:03 AM  LOS: 4 days   Patient seen  and examined and agree with assessment and plan as above.  Dorothy Moselle  MD [facility] [phone] pgr    [phone] cell 04/05/2012, 3:09 PM   Additional Objective Labs: Basic Metabolic Panel:  Lab 04/04/12 2130 04/03/12 1119 04/02/12 0710  NA 130* 132* 135  K 4.1 4.0 4.4  CL 94* 96 98  CO2 27 25 27   GLUCOSE 142* 239* 70  BUN 30* 25* 18  CREATININE 5.35* 4.72* 3.76*  CALCIUM 10.1 9.9 7.9*  ALB -- -- --  PHOS 4.1 3.4 --   Liver Function Tests:  Lab 04/04/12 1120 04/03/12 1119 04/01/12 1630  AST -- -- 17  ALT -- -- 8  ALKPHOS -- -- 119*  BILITOT -- -- 0.5  PROT -- -- 7.8  ALBUMIN 2.1* 2.1* 2.5*    Lab 04/01/12 1630  LIPASE 11  AMYLASE --   CBC:  Lab 04/04/12 1120 04/03/12 1119 04/02/12 0710 04/01/12 1630  WBC 11.5* 15.5* 9.4 --  NEUTROABS -- -- -- 9.3*  HGB 7.4* 7.8* 7.2* --  HCT 24.8* 26.4* 24.0* --  MCV 87.3 86.8 85.7 85.7  PLT 196 177 159 --   Blood Culture    Component Value Date/Time   SDES BLOOD LEFT HAND 02/24/2012 0635   SPECREQUEST BOTTLES DRAWN AEROBIC ONLY 10CC 02/24/2012 8657  CULT NO GROWTH 5 DAYS 02/24/2012 0635   REPTSTATUS 03/01/2012 FINAL 02/24/2012 0635   Medications:      . carvedilol  3.125 mg Oral BID WC  . darbepoetin (ARANESP) injection - DIALYSIS  100 mcg Intravenous Q Mon-HD  . feeding supplement (NEPRO CARB STEADY)  237 mL Oral TID BM  . heparin  1,200 Units Intravenous Once  . heparin subcutaneous  5,000 Units Subcutaneous Q8H  . HYDROmorphone  2 mg Oral TID  . methadone  5 mg Oral TID  . multivitamin  1 tablet Oral QHS  . pantoprazole  40 mg Oral Daily  . ranolazine  500 mg Oral Daily  . sevelamer  800 mg Oral TID WC  . simvastatin  10 mg Oral q1800  . sodium chloride  3 mL Intravenous Q12H  . sodium chloride  3 mL Intravenous Q12H

## 2012-04-05 NOTE — Progress Notes (Addendum)
[facility] PROGRESS NOTE  KAILEIGH VISWANATHAN VHQ:[id number] DOB: 1951-04-23 DOA: 04/01/2012 PCP: [name], MD  Assessment/Plan: Hyperkalemia- s/p dialysis that she missed earlier- K+ back in normal range  Non sustained v tach- check Mg, add BB- does have a pacemaker and low EF- follows outpatient with a cardiologist  .Chronic systolic congestive heart failure, NYHA class 3- EF 20-25%- controlled with dialysis  fatigue ? Related to hyperkalemia, and anemia PT eval- has home health PT already, can walk but usually uses wheelchair  .Anemia in chronic kidney disease (CKD)- aranesp, no c/o blood in stool, no back pain- defer to nephro  Hypertension: add low dose BB  Leukocytosis- x ray only showed fluid no PNA, cough resolved after dialysis   Code Status: full Family Communication: patient at bedside Disposition Plan: hope to d/c today!   Consultants:  nephrology   HPI/Subjective: Feeling much better today than when she came in. Said how much she appreciates our care  Objective: Filed Vitals:   04/04/12 1817 04/04/12 1925 04/04/12 2020 04/04/12 2105  BP: 109/64 139/55 107/81 119/69  Pulse: 74 72 71 70  Temp: 98.2 F (36.8 C) 98.4 F (36.9 C) 98.6 F (37 C) 98.3 F (36.8 C)  TempSrc: Oral Oral Oral Oral  Resp: 16 16 18 16   Height:      Weight:      SpO2:        Intake/Output Summary (Last 24 hours) at 04/05/12 4132 Last data filed at 04/04/12 1755  Gross per 24 hour  Intake  484.5 ml  Output   4976 ml  Net -4491.5 ml   Filed Weights   04/02/12 0434 04/03/12 2150 04/04/12 1513  Weight: 55.8 kg (123 lb 0.3 oz) 56.9 kg (125 lb 7.1 oz) 53.5 kg (117 lb 15.1 oz)    Exam:   General:  A+Ox3, NAD  Cardiovascular: rrr, no edema in legs  Respiratory: clear  Abdomen: +BS, soft, NT  Data Reviewed: Basic Metabolic Panel:  Lab 04/04/12 4401 04/03/12 1119 04/02/12 0710 04/01/12 1849 04/01/12 1630  NA 130* 132* 135 -- 131*  K 4.1 4.0 4.4 6.4* 6.6*  CL  94* 96 98 -- 93*  CO2 27 25 27  -- 21  GLUCOSE 142* 239* 70 -- 89  BUN 30* 25* 18 -- 54*  CREATININE 5.35* 4.72* 3.76* -- 8.23*  CALCIUM 10.1 9.9 7.9* -- 10.4  MG -- -- 1.8 -- --  PHOS 4.1 3.4 -- -- --   Liver Function Tests:  Lab 04/04/12 1120 04/03/12 1119 04/01/12 1630  AST -- -- 17  ALT -- -- 8  ALKPHOS -- -- 119*  BILITOT -- -- 0.5  PROT -- -- 7.8  ALBUMIN 2.1* 2.1* 2.5*    Lab 04/01/12 1630  LIPASE 11  AMYLASE --   No results found for this basename: AMMONIA:5 in the last 168 hours CBC:  Lab 04/04/12 1120 04/03/12 1119 04/02/12 0710 04/01/12 1630  WBC 11.5* 15.5* 9.4 11.6*  NEUTROABS -- -- -- 9.3*  HGB 7.4* 7.8* 7.2* 8.9*  HCT 24.8* 26.4* 24.0* 30.0*  MCV 87.3 86.8 85.7 85.7  PLT 196 177 159 286   Cardiac Enzymes: No results found for this basename: CKTOTAL:5,CKMB:5,CKMBINDEX:5,TROPONINI:5 in the last 168 hours BNP (last 3 results)  Basename 03/02/12 1633 06/02/11 2034  PROBNP >70000.0* 31365.0*   CBG: No results found for this basename: GLUCAP:5 in the last 168 hours  Recent Results (from the past 240 hour(s))  MRSA PCR SCREENING     Status: Normal  Collection Time   04/02/12  7:27 AM      Component Value Range Status Comment   MRSA by PCR NEGATIVE  NEGATIVE Final      Studies: Dg Chest Port 1 View  04/04/2012  *RADIOLOGY REPORT*  Clinical Data: Cough, renal failure, diabetes, CHF  PORTABLE CHEST - 1 VIEW  Comparison: 03/02/2012  Findings: Stable right IJ dialysis catheter tips in the proximal right atrium.  Left subclavian AICD / pacer noted.  Prior coronary bypass evident.  Heart remains enlarged with mild vascular congestion centrally.  No definite focal pneumonia, collapse, consolidation, large effusion or pneumothorax.  Trachea midline. Atherosclerosis of the aorta.  IMPRESSION: Cardiomegaly with vascular congestion.   Original Report Authenticated By: Judie Petit. Ruel Favors, M.D.     Scheduled Meds:    . carvedilol  3.125 mg Oral BID WC  .  darbepoetin (ARANESP) injection - DIALYSIS  100 mcg Intravenous Q Mon-HD  . feeding supplement (NEPRO CARB STEADY)  237 mL Oral TID BM  . heparin  1,200 Units Intravenous Once  . heparin subcutaneous  5,000 Units Subcutaneous Q8H  . HYDROmorphone  2 mg Oral TID  . methadone  5 mg Oral TID  . multivitamin  1 tablet Oral QHS  . pantoprazole  40 mg Oral Daily  . ranolazine  500 mg Oral Daily  . sevelamer  800 mg Oral TID WC  . simvastatin  10 mg Oral q1800  . sodium chloride  3 mL Intravenous Q12H  . sodium chloride  3 mL Intravenous Q12H   Continuous Infusions:   Principal Problem:  *Hyperkalemia Active Problems:  Chronic systolic congestive heart failure, NYHA class 3- EF 20-25%  Hypertension  Pancreatitis, chronic  Anemia in chronic kidney disease (CKD)  Fatigue    Time spent: 35 min    Benjamine Mola JESSICA  [facility] Pager [phone] 04/05/2012, 8:22 AM  LOS: 4 days

## 2012-04-05 NOTE — Progress Notes (Signed)
Physical Therapy Treatment Patient Details Name: [name] MRN: [id number] DOB: Feb 16, 1951 Today's Date: 04/05/2012 Time: 1201-1239 PT Time Calculation (min): 38 min  PT Assessment / Plan / Recommendation Comments on Treatment Session  admitted with hyperkalemia; Making great functional progress, especially with activity tol and amb distance; pt is looking forward to restarting HHPT post dc; on track for dc home from PT standpoint    Follow Up Recommendations  Home health PT     Does the patient have the potential to tolerate intense rehabilitation     Barriers to Discharge        Equipment Recommendations  None recommended by PT    Recommendations for Other Services    Frequency Min 3X/week   Plan Discharge plan remains appropriate    Precautions / Restrictions Precautions Precautions: Fall   Pertinent Vitals/Pain no apparent distress     Mobility  Bed Mobility Bed Mobility: Supine to Sit;Sitting - Scoot to Edge of Bed Supine to Sit: 6: Modified independent (Device/Increase time) Sitting - Scoot to Edge of Bed: 6: Modified independent (Device/Increase time) Details for Bed Mobility Assistance: Pretty smooth transitions Transfers Transfers: Sit to Stand;Stand to Sit Sit to Stand: 5: Supervision Stand to Sit: 5: Supervision Details for Transfer Assistance: Cues for safety, self-monitor for activity tol Ambulation/Gait Ambulation/Gait Assistance: 4: Min guard;5: Supervision (with and without physical contact) Ambulation Distance (Feet): 400 Feet Assistive device: None;Rolling walker Ambulation/Gait Assistance Details: Ambulated short distances without assistive device and closeguard (though no physical assist needed); Very slow, small steps, but no overt loss of balance; then amb with RW and supervision; worked on some balance activities like high stepping; cues for posture; Excellent progress with activity tol Gait Pattern: Step-through pattern    Exercises      PT Diagnosis:    PT Problem List:   PT Treatment Interventions:     PT Goals Acute Rehab PT Goals Time For Goal Achievement: 04/10/12 Potential to Achieve Goals: Good Pt will go Supine/Side to Sit: with modified independence PT Goal: Supine/Side to Sit - Progress: Met Pt will go Sit to Stand: with modified independence PT Goal: Sit to Stand - Progress: Progressing toward goal Pt will go Stand to Sit: with modified independence PT Goal: Stand to Sit - Progress: Progressing toward goal Pt will Ambulate: >150 feet;with modified independence;with rolling walker PT Goal: Ambulate - Progress: Progressing toward goal  Visit Information  Last PT Received On: 04/05/12 Assistance Needed: +1    Subjective Data  Subjective: Feels better; very glad her ankle swelling has gone down Patient Stated Goal: home   Cognition  Overall Cognitive Status: Appears within functional limits for tasks assessed/performed Arousal/Alertness: Awake/alert Orientation Level: Appears intact for tasks assessed Behavior During Session: [facility] for tasks performed    Balance     End of Session PT - End of Session Activity Tolerance: Patient tolerated treatment well Patient left: in chair;with call bell/phone within reach Nurse Communication: Mobility status   GP     Van Clines [facility] [place],  [phone]  04/05/2012, 1:24 PM

## 2012-04-07 DIAGNOSIS — L8990 Pressure ulcer of unspecified site, unspecified stage: Secondary | ICD-10-CM | POA: Insufficient documentation

## 2012-04-07 DIAGNOSIS — N186 End stage renal disease: Secondary | ICD-10-CM | POA: Insufficient documentation

## 2012-04-11 NOTE — ED Provider Notes (Signed)
Medical screening examination/treatment/procedure(s) were performed by non-physician practitioner and as supervising physician I was immediately available for consultation/collaboration.  Juliet Rude. Rubin Payor, MD 04/11/12 [phone]

## 2012-04-21 ENCOUNTER — Observation Stay (HOSPITAL_COMMUNITY): Payer: Non-veteran care

## 2012-04-21 ENCOUNTER — Emergency Department (HOSPITAL_COMMUNITY): Payer: Non-veteran care

## 2012-04-21 ENCOUNTER — Observation Stay (HOSPITAL_COMMUNITY)
Admission: EM | Admit: 2012-04-21 | Discharge: 2012-04-24 | Disposition: A | Discharge status: Home / Self Care | Payer: Non-veteran care | Attending: Internal Medicine | Admitting: Internal Medicine

## 2012-04-21 ENCOUNTER — Encounter (HOSPITAL_COMMUNITY): Payer: Self-pay | Admitting: Family Medicine

## 2012-04-21 DIAGNOSIS — R1110 Vomiting, unspecified: Secondary | ICD-10-CM

## 2012-04-21 DIAGNOSIS — I10 Essential (primary) hypertension: Secondary | ICD-10-CM | POA: Diagnosis present

## 2012-04-21 DIAGNOSIS — E875 Hyperkalemia: Secondary | ICD-10-CM | POA: Diagnosis present

## 2012-04-21 DIAGNOSIS — M7989 Other specified soft tissue disorders: Secondary | ICD-10-CM

## 2012-04-21 DIAGNOSIS — R109 Unspecified abdominal pain: Secondary | ICD-10-CM | POA: Insufficient documentation

## 2012-04-21 DIAGNOSIS — E119 Type 2 diabetes mellitus without complications: Secondary | ICD-10-CM | POA: Insufficient documentation

## 2012-04-21 DIAGNOSIS — N185 Chronic kidney disease, stage 5: Secondary | ICD-10-CM | POA: Diagnosis present

## 2012-04-21 DIAGNOSIS — R112 Nausea with vomiting, unspecified: Secondary | ICD-10-CM

## 2012-04-21 DIAGNOSIS — I120 Hypertensive chronic kidney disease with stage 5 chronic kidney disease or end stage renal disease: Secondary | ICD-10-CM | POA: Insufficient documentation

## 2012-04-21 DIAGNOSIS — I5022 Chronic systolic (congestive) heart failure: Principal | ICD-10-CM | POA: Diagnosis present

## 2012-04-21 DIAGNOSIS — I509 Heart failure, unspecified: Secondary | ICD-10-CM | POA: Insufficient documentation

## 2012-04-21 DIAGNOSIS — Z992 Dependence on renal dialysis: Secondary | ICD-10-CM | POA: Insufficient documentation

## 2012-04-21 DIAGNOSIS — N186 End stage renal disease: Secondary | ICD-10-CM | POA: Insufficient documentation

## 2012-04-21 DIAGNOSIS — Z95810 Presence of automatic (implantable) cardiac defibrillator: Secondary | ICD-10-CM | POA: Insufficient documentation

## 2012-04-21 LAB — COMPREHENSIVE METABOLIC PANEL
Alkaline Phosphatase: 100 U/L (ref 39–117)
BUN: 83 mg/dL — ABNORMAL HIGH (ref 6–23)
CO2: 22 mEq/L (ref 19–32)
Chloride: 92 mEq/L — ABNORMAL LOW (ref 96–112)
Creatinine, Ser: 8.46 mg/dL — ABNORMAL HIGH (ref 0.50–1.10)
GFR calc non Af Amer: 4 mL/min — ABNORMAL LOW (ref >90)
Glucose, Bld: 101 mg/dL — ABNORMAL HIGH (ref 70–99)
Potassium: 6.4 mEq/L (ref 3.5–5.1)
Total Bilirubin: 0.4 mg/dL (ref 0.3–1.2)

## 2012-04-21 LAB — BASIC METABOLIC PANEL
Calcium: 10.7 mg/dL — ABNORMAL HIGH (ref 8.4–10.5)
Chloride: 97 mEq/L (ref 96–112)
Creatinine, Ser: 8.68 mg/dL — ABNORMAL HIGH (ref 0.50–1.10)
GFR calc Af Amer: 5 mL/min — ABNORMAL LOW (ref >90)
Sodium: 131 mEq/L — ABNORMAL LOW (ref 135–145)

## 2012-04-21 LAB — CBC WITH DIFFERENTIAL/PLATELET
HCT: 32.3 % — ABNORMAL LOW (ref 36.0–46.0)
Hemoglobin: 9.9 g/dL — ABNORMAL LOW (ref 12.0–15.0)
Lymphocytes Relative: 9 % — ABNORMAL LOW (ref 12–46)
Lymphs Abs: 1.2 10*3/uL (ref 0.7–4.0)
MCHC: 30.7 g/dL (ref 30.0–36.0)
Monocytes Absolute: 0.8 10*3/uL (ref 0.1–1.0)
Monocytes Relative: 6 % (ref 3–12)
Neutro Abs: 11 10*3/uL — ABNORMAL HIGH (ref 1.7–7.7)
RBC: 3.69 MIL/uL — ABNORMAL LOW (ref 3.87–5.11)

## 2012-04-21 LAB — LIPASE, BLOOD: Lipase: 16 U/L (ref 11–59)

## 2012-04-21 LAB — TROPONIN I: Troponin I: <0.3 ng/mL (ref <0.30)

## 2012-04-21 MED ORDER — CALCIUM GLUCONATE 10 % IV SOLN
1.0000 g | INTRAVENOUS | Status: AC
  Administered 2012-04-21: 1 g via INTRAVENOUS
  Filled 2012-04-21: qty 10

## 2012-04-21 MED ORDER — DEXTROSE 5 % IV SOLN
2.0000 g | INTRAVENOUS | Status: DC
  Administered 2012-04-22 – 2012-04-24 (×3): 2 g via INTRAVENOUS
  Filled 2012-04-21 (×3): qty 2

## 2012-04-21 MED ORDER — HYDROMORPHONE HCL PF 1 MG/ML IJ SOLN
0.5000 mg | Freq: Once | INTRAMUSCULAR | Status: AC
  Administered 2012-04-21: 0.5 mg via INTRAVENOUS

## 2012-04-21 MED ORDER — ONDANSETRON HCL 4 MG/2ML IJ SOLN
4.0000 mg | Freq: Once | INTRAMUSCULAR | Status: AC
  Administered 2012-04-21: 4 mg via INTRAVENOUS
  Filled 2012-04-21: qty 2

## 2012-04-21 MED ORDER — HYDROMORPHONE HCL PF 1 MG/ML IJ SOLN
INTRAMUSCULAR | Status: AC
  Filled 2012-04-21: qty 1

## 2012-04-21 MED ORDER — HYDROMORPHONE HCL PF 1 MG/ML IJ SOLN
1.0000 mg | Freq: Once | INTRAMUSCULAR | Status: AC
  Administered 2012-04-21: 1 mg via INTRAVENOUS
  Filled 2012-04-21: qty 1

## 2012-04-21 MED ORDER — SODIUM BICARBONATE 8.4 % IV SOLN
50.0000 meq | Freq: Once | INTRAVENOUS | Status: AC
  Administered 2012-04-21: 50 meq via INTRAVENOUS
  Filled 2012-04-21: qty 50

## 2012-04-21 NOTE — Consult Note (Signed)
[facility] [place]  Referred by:  Dr. Chaney Malling  Reason for referral: evaluate left arm access  History of Present Illness  [name] is a 61 y.o. (02-28-1951) female who presents with chief complaint: nausea and vomitting.  Pt had a left forearm loop AVG placement in the last few weeks.  The left arm has been swollen since then.  She is right handed and has a L PPM in place.  This access was placed by a [facility] per patient.  She denies any steal sx.  She notes no fever or chills.  She has pain with flex of the left elbow.   Past Medical History  Diagnosis Date  . Gallstone pancreatitis   . Coronary artery disease     s/p CABG 2008 with multiple PCIs  . Ischemic cardiomyopathy   . End stage renal disease   . Diabetes mellitus   . Hypertension   . History of nonadherence to medical treatment   . LBBB (left bundle branch block)   . Hyperlipidemia   . Renal insufficiency   . CHF (congestive heart failure)   . Dialysis patient   . Anemia     Past Surgical History  Procedure Date  . Coronary artery bypass graft 2008  . Cholecystectomy   . Abdominal hysterectomy   . Tubal ligation   . Tonsillectomy   . Insert / replace / remove pacemaker     pacemaker ICD  . Esophagogastroduodenoscopy 09/15/2011    Procedure: ESOPHAGOGASTRODUODENOSCOPY (EGD);  Surgeon: Theda Belfast, MD;  Location: [facility] ENDOSCOPY;  Service: Endoscopy;  Laterality: N/A;    History   Social History  . Marital Status: Single    Spouse Name: N/A    Number of Children: N/A  . Years of Education: N/A   Occupational History  . Not on file.   Social History Main Topics  . Smoking status: Former Smoker    Quit date: 06/15/2000  . Smokeless tobacco: Not on file  . Alcohol Use: No  . Drug Use: No  . Sexually Active: Not on file   Other Topics Concern  . Not on file   Social History Narrative   The patient lives alone in [place].  She is a retired  nurse-Psychiatric and ortho.  She is disabled and receives most of her care from the [place], [place].  Son-shawn-[phone] alone    Family History  Problem Relation Age of Onset  . Coronary artery disease Mother   . Hypertension Mother   . Diabetes Mother   . Diabetes Sister   . Anesthesia problems Neg Hx     No current facility-administered medications on file prior to encounter.   Current Outpatient Prescriptions on File Prior to Encounter  Medication Sig Dispense Refill  . carvedilol (COREG) 3.125 MG tablet Take 1 tablet (3.125 mg total) by mouth 2 (two) times daily with a meal.  60 tablet  0  . HYDROmorphone (DILAUDID) 2 MG tablet Take 1 tablet (2 mg total) by mouth 3 (three) times daily. Takes along with methadone.  21 tablet  0  . methadone (DOLOPHINE) 5 MG tablet Take 1 tablet (5 mg total) by mouth 3 (three) times daily. Takes along with Dilaudid.  21 tablet  0  . multivitamin (RENA-VIT) TABS tablet Take 1 tablet by mouth daily.      . pantoprazole (PROTONIX) 40 MG tablet Take 40 mg by mouth daily.      . ranolazine (RANEXA) 500 MG  12 hr tablet Take 500 mg by mouth daily.       . darbepoetin (ARANESP) 100 MCG/0.5ML SOLN Inject 0.5 mLs (100 mcg total) into the vein every Monday with hemodialysis.  4.2 mL    . Nutritional Supplements (FEEDING SUPPLEMENT, NEPRO CARB STEADY,) LIQD Take 237 mLs by mouth 3 (three) times daily between meals.      . pravastatin (PRAVACHOL) 20 MG tablet Take 20 mg by mouth at bedtime.       . sevelamer (RENAGEL) 800 MG tablet Take 800 mg by mouth 3 (three) times daily with meals.         Allergies  Allergen Reactions  . Morphine And Related Anaphylaxis  . Codeine Itching  . Lisinopril Other (See Comments)    unknown  . Tylenol (Acetaminophen) Other (See Comments)    Patient states that the doctor told her she has a Fatty Liver.    REVIEW OF SYSTEMS:  (Positives checked otherwise negative)  CARDIOVASCULAR:  [ ]  chest pain, [ ]  chest  pressure, [ ]  palpitations, [ ]  shortness of breath when laying flat, [x]  shortness of breath with exertion, [ ]  pain in feet when laying flat, [ ]  history of blood clot in veins (DVT), [ ]  history of phlebitis, [ ]  swelling in legs, [ ]  varicose veins  PULMONARY:  [ ]  productive cough, [ ]  asthma, [ ]  wheezing  NEUROLOGIC:  [ ]  weakness in arms or legs, [ ]  numbness in arms or legs, [ ]  difficulty speaking or slurred speech, [ ]  temporary loss of vision in one eye, [ ]  dizziness  HEMATOLOGIC:  [ ]  bleeding problems, [ ]  problems with blood clotting too easily  MUSCULOSKEL:  [ ]  joint pain, [ ]  joint swelling  GASTROINTEST:  [x]   Vomiting , [ ]   Blood in stool     GENITOURINARY:  [ ]   Burning with urination, [ ]   Blood in urine  PSYCHIATRIC:  [ ]  history of major depression  INTEGUMENTARY:  [ ]  rashes, [ ]  ulcers  CONSTITUTIONAL:  [ ]  fever, [ ]  chills  Physical Examination  Filed Vitals:   04/21/12 1352 04/21/12 1600 04/21/12 1630 04/21/12 1826  BP: 128/81 133/91 130/83 133/95  Pulse: 66 68 65 60  Temp: 98.3 F (36.8 C)     TempSrc: Oral     Resp: 18 18 17 16   SpO2: 100% 100% 98% 96%   There is no height or weight on file to calculate BMI.  General: A&O x 3, appears older than stated age  Head: /AT  Ear/Nose/Throat: Hearing grossly intact, nares w/o erythema or drainage, oropharynx w/o Erythema/Exudate  Eyes: PERRLA, EOMI  Neck: Supple, no nuchal rigidity, no palpable LAD  Pulmonary: Sym exp, faint breath sounds, CTAB, no rales, rhonchi, & wheezing  Cardiac: RRR, Nl S1, S2, no Murmurs, rubs or gallops  Vascular: Vessel Right Left  Radial Palpable Faintly Palpable  Ulnar Faintly Palpable Faintly Palpable  Brachial Palpable Palpable  Carotid Palpable, without bruit Palpable, without bruit  Aorta Not palpable N/A  Femoral Palpable Palpable  Popliteal Not palpable Not palpable  PT Not Palpable Not Palpable  DP Not Palpable Not Palpable   Gastrointestinal:  soft, mild epigastric TTP, ND, -G/R, - HSM, - masses, - CVAT B  Musculoskeletal: M/S 5/5 throughout , Extremities without ischemic changes , cool feet, mild L arm swelling throughout, no TTP to light touch around incision, mild erythema to L incision, palpable forearm loop graft, +bruit, +thrill  Neurologic:  CN 2-12 intact , Pain and light touch intact in extremities , Motor exam as listed above  Psychiatric: Judgment intact, Mood & affect appropriate for pt's clinical situation  Dermatologic: See M/S exam for extremity exam, no rashes otherwise noted  Lymph : No Cervical, Axillary, or Inguinal lymphadenopathy   Laboratory: CBC:    Component Value Date/Time   WBC 13.1* 04/21/2012 1416   WBC 6.7 04/08/2011 1054   RBC 3.69* 04/21/2012 1416   RBC 3.40* 04/08/2011 1054   HGB 9.9* 04/21/2012 1416   HGB 10.1* 04/08/2011 1054   HCT 32.3* 04/21/2012 1416   HCT 30.1* 04/08/2011 1054   PLT 199 04/21/2012 1416   PLT 232 04/08/2011 1054   MCV 87.5 04/21/2012 1416   MCV 88.6 04/08/2011 1054   MCH 26.8 04/21/2012 1416   MCH 29.7 04/08/2011 1054   MCHC 30.7 04/21/2012 1416   MCHC 33.5 04/08/2011 1054   RDW 18.2* 04/21/2012 1416   RDW 20.4* 04/08/2011 1054   LYMPHSABS 1.2 04/21/2012 1416   LYMPHSABS 1.4 04/08/2011 1054   MONOABS 0.8 04/21/2012 1416   MONOABS 0.4 04/08/2011 1054   EOSABS 0.2 04/21/2012 1416   EOSABS 0.2 04/08/2011 1054   BASOSABS 0.1 04/21/2012 1416   BASOSABS 0.0 04/08/2011 1054    BMP:    Component Value Date/Time   NA 131* 04/21/2012 1758   K 6.6* 04/21/2012 1758   CL 97 04/21/2012 1758   CO2 20 04/21/2012 1758   GLUCOSE 92 04/21/2012 1758   BUN 87* 04/21/2012 1758   CREATININE 8.68* 04/21/2012 1758   CALCIUM 10.7* 04/21/2012 1758   GFRNONAA 4* 04/21/2012 1758   GFRAA 5* 04/21/2012 1758    Coagulation: Lab Results  Component Value Date   INR 1.26 10/13/2011   INR 1.47 10/10/2011   INR 1.27 09/14/2011   No results found for this basename: PTT    Radiology Dg Abd Acute  W/chest  04/21/2012  *RADIOLOGY REPORT*  Clinical Data: Abdominal pain, nausea and vomiting  ACUTE ABDOMEN SERIES (ABDOMEN 2 VIEW & CHEST 1 VIEW)  Comparison: 04/01/2012; chest radiograph - 04/04/2012  Findings:  Grossly unchanged enlarged cardiac silhouette and mediastinal contours post median sternotomy and CABG.  Stable positioning of support apparatus.  No focal parenchymal opacities. Pulmonary venous congestion without frank evidence of edema.  No definite pleural effusion or pneumothorax.  Nonobstructive bowel gas pattern.  No pneumoperitoneum, pneumatosis or portal venous gas.  An enteric staple line overlies the left mid hemiabdomen.  Post cholecystectomy.  No acute osseous abnormalities.  IMPRESSION: 1.  Nonobstructive bowel gas pattern.  2.  Stable enlargement of the cardiac silhouette and mediastinal contours without definite evidence of pulmonary edema.   Original Report Authenticated By: Tacey Ruiz, MD    Medical Decision Making  DARLYN REPSHER is a 61 y.o. female who presents with: s/p L forearm loop AVG, s/p L PPM placement.   Given the AVG was placed on the side with the PPM,  some degree of central venous stenosis is possible.  However, the amount of edema is also consistent with s/p AVG placement.  Keep left arm elevated.  If the swelling resolves with time, it most likely is post-op swelling associated with AVG placement.  Otherwise, central venous stenosis should be investigated with a fistulogram with possible intervention.  In regards to possible infection, I am not convinced that the arteriovenous graft is infected.  A short course of PO abx may assist with any limited cellulitis around the incision.  The patient has a follow up appointment already arranged with her vascular surgeon, so I will defer future management to him or her.  Available as needed.  Leonides Sake, MD [facility] [place] Office: [phone] Pager: [phone]  04/21/2012,  8:55 PM

## 2012-04-21 NOTE — Procedures (Signed)
Pt seen on HD  Just starting so BFR at 150 but to go to 400.  Ap30 Vp 50  Try to pull 2.5-3 liters.

## 2012-04-21 NOTE — ED Notes (Signed)
Per EMS, pt was due for dialysis yesterday and missed because she wasn't feeling well. sts SOB,N,V.

## 2012-04-21 NOTE — ED Provider Notes (Signed)
History     CSN: [id number]  Arrival date & time 04/21/12  1348   First MD Initiated Contact with Patient 04/21/12 1354      Chief Complaint  Patient presents with  . Emesis  . Nausea    (Consider location/radiation/quality/duration/timing/severity/associated sxs/prior treatment) The history is provided by the patient.  [name] is a 61 y.o. female hx of chronic pancreatitis, ESRD on HD (last HD was 4 days ago), DM, HTN here with vomiting, diarrhea. She has chronic abdominal pain that got worse for the last 4 days. Then she had vomiting and diarrhea. Has generalized weakness. She missed dialysis yesterday because she felt very weak. No fevers. Took her zofran and dilaudid, not improving. She has this previously and was diagnosed with hyperkalemia.    Past Medical History  Diagnosis Date  . Gallstone pancreatitis   . Coronary artery disease     s/p CABG 2008 with multiple PCIs  . Ischemic cardiomyopathy   . End stage renal disease   . Diabetes mellitus   . Hypertension   . History of nonadherence to medical treatment   . LBBB (left bundle branch block)   . Hyperlipidemia   . Renal insufficiency   . CHF (congestive heart failure)   . Dialysis patient   . Anemia     Past Surgical History  Procedure Date  . Coronary artery bypass graft 2008  . Cholecystectomy   . Abdominal hysterectomy   . Tubal ligation   . Tonsillectomy   . Insert / replace / remove pacemaker     pacemaker ICD  . Esophagogastroduodenoscopy 09/15/2011    Procedure: ESOPHAGOGASTRODUODENOSCOPY (EGD);  Surgeon: Theda Belfast, MD;  Location: [facility] ENDOSCOPY;  Service: Endoscopy;  Laterality: N/A;    Family History  Problem Relation Age of Onset  . Coronary artery disease Mother   . Hypertension Mother   . Diabetes Mother   . Diabetes Sister   . Anesthesia problems Neg Hx     History  Substance Use Topics  . Smoking status: Former Smoker    Quit date: 06/15/2000  . Smokeless tobacco: Not on  file  . Alcohol Use: No    OB History    Grav Para Term Preterm Abortions TAB SAB Ect Mult Living                  Review of Systems  Gastrointestinal: Positive for vomiting, abdominal pain and diarrhea.  Neurological: Positive for weakness.  All other systems reviewed and are negative.    Allergies  Morphine and related; Codeine; Lisinopril; and Tylenol  Home Medications   Current Outpatient Rx  Name  Route  Sig  Dispense  Refill  . CARVEDILOL 3.125 MG PO TABS   Oral   Take 1 tablet (3.125 mg total) by mouth 2 (two) times daily with a meal.   60 tablet   0   . HYDROMORPHONE HCL 2 MG PO TABS   Oral   Take 1 tablet (2 mg total) by mouth 3 (three) times daily. Takes along with methadone.   21 tablet   0   . METHADONE HCL 5 MG PO TABS   Oral   Take 1 tablet (5 mg total) by mouth 3 (three) times daily. Takes along with Dilaudid.   21 tablet   0   . RENA-VITE PO TABS   Oral   Take 1 tablet by mouth daily.         [name] PANTOPRAZOLE SODIUM 40 MG  PO TBEC   Oral   Take 40 mg by mouth daily.         [name] RANOLAZINE ER 500 MG PO TB12   Oral   Take 500 mg by mouth daily.          [name] DARBEPOETIN ALFA-POLYSORBATE 100 MCG/0.5ML IJ SOLN   Intravenous   Inject 0.5 mLs (100 mcg total) into the vein every Monday with hemodialysis.   4.2 mL      . NEPRO/CARB STEADY PO LIQD   Oral   Take 237 mLs by mouth 3 (three) times daily between meals.         [name] PRAVASTATIN SODIUM 20 MG PO TABS   Oral   Take 20 mg by mouth at bedtime.          [name] SEVELAMER HCL 800 MG PO TABS   Oral   Take 800 mg by mouth 3 (three) times daily with meals.            BP 128/81  Pulse 66  Temp 98.3 F (36.8 C) (Oral)  Resp 18  SpO2 100%  Physical Exam  Nursing note and vitals reviewed. Constitutional: She is oriented to person, place, and time.       Chronically ill, uncomfortable   HENT:  Head: Normocephalic.       MM slightly dry   Eyes: Conjunctivae normal are normal. Pupils  are equal, round, and reactive to light.  Neck: Normal range of motion. Neck supple.  Cardiovascular: Normal rate, regular rhythm and normal heart sounds.   Pulmonary/Chest: Effort normal and breath sounds normal. No respiratory distress. She has no wheezes.       Decreased breath sounds bilateral bases.   Abdominal: Soft.       + epigastric tenderness. Some fluid wave. Slight rebound.   Musculoskeletal: Normal range of motion.       2+ edema   Neurological: She is alert and oriented to person, place, and time. No cranial nerve deficit.  Skin: Skin is warm and dry.  Psychiatric: She has a normal mood and affect. Her behavior is normal. Thought content normal.    ED Course  Procedures (including critical care time)  Labs Reviewed  CBC WITH DIFFERENTIAL - Abnormal; Notable for the following:    WBC 13.1 (*)     RBC 3.69 (*)     Hemoglobin 9.9 (*)     HCT 32.3 (*)     RDW 18.2 (*)     Neutrophils Relative 84 (*)     Neutro Abs 11.0 (*)     Lymphocytes Relative 9 (*)     All other components within normal limits  COMPREHENSIVE METABOLIC PANEL - Abnormal; Notable for the following:    Sodium 129 (*)     Potassium 6.4 (*)     Chloride 92 (*)     Glucose, Bld 101 (*)     BUN 83 (*)     Creatinine, Ser 8.46 (*)     Calcium 11.0 (*)     Albumin 2.6 (*)     GFR calc non Af Amer 4 (*)     GFR calc Af Amer 5 (*)     All other components within normal limits  PRO B NATRIURETIC PEPTIDE - Abnormal; Notable for the following:    Pro B Natriuretic peptide (BNP) >70000.0 (*)     All other components within normal limits  TROPONIN I  LIPASE, BLOOD   Dg Abd Acute W/chest  04/21/2012  *RADIOLOGY REPORT*  Clinical Data: Abdominal pain, nausea and vomiting  ACUTE ABDOMEN SERIES (ABDOMEN 2 VIEW & CHEST 1 VIEW)  Comparison: 04/01/2012; chest radiograph - 04/04/2012  Findings:  Grossly unchanged enlarged cardiac silhouette and mediastinal contours post median sternotomy and CABG.  Stable  positioning of support apparatus.  No focal parenchymal opacities. Pulmonary venous congestion without frank evidence of edema.  No definite pleural effusion or pneumothorax.  Nonobstructive bowel gas pattern.  No pneumoperitoneum, pneumatosis or portal venous gas.  An enteric staple line overlies the left mid hemiabdomen.  Post cholecystectomy.  No acute osseous abnormalities.  IMPRESSION: 1.  Nonobstructive bowel gas pattern.  2.  Stable enlargement of the cardiac silhouette and mediastinal contours without definite evidence of pulmonary edema.   Original Report Authenticated By: Tacey Ruiz, MD      No diagnosis found.   Date: 04/21/2012  Rate: 67  Rhythm: normal sinus rhythm  QRS Axis: normal  Intervals: normal  ST/T Wave abnormalities: nonspecific ST changes  Conduction Disutrbances:left bundle branch block  Narrative Interpretation:   Old EKG Reviewed: none available    MDM  Caisley A Faraone is a 61 y.o. female here with ab pain, vomiting, weakness. Will need to r/o hyperkalemia, SBO, pancreatitis. She appeared dehydrated but her exam seems to suggest that she has CHF exacerbation from not getting dialysis. Will hold off on fluids or lasix for now.   4:15 PM Patient had recent CT ab/pel nl, Xray showed no obstruction. Lipase nl. BNP > 70,000. K 6.4. She is volume overloaded with hyperkalemia. I ordered Ca gluconate, bicarb. I called nephrology, who will dialyze her. I also admitted her to medicine.   4:29 PM I talked to team A nephrology, who will see the patient. I discussed with Dr. Clyde Lundborg, who accepted the patient on tele.      Richardean Canal, MD 04/21/12 [phone]

## 2012-04-21 NOTE — ED Notes (Signed)
Nephrology at bedside

## 2012-04-21 NOTE — H&P (Signed)
Hospital Admission Note Date: 04/21/2012  Patient name: [name] Medical record number: [id number] Date of birth: 08-30-1950 Age: 61 y.o. Gender: female PCP: [name],ROSS, MD  Internal Medicine Teaching Service  Attending physician:  Dr. Criselda [name]     Internal Medicine Teaching Service Contact Information  1st Contact: Dorothy Ar, MD  Pager:[phone] 2nd Contact:  Dorothy Harp, MD   [phone]  After 5 pm or weekends: 1st Contact: Pager: [phone] 2nd Contact: Pager: [phone]   Chief Complaint: weakness, nausea, vomiting  History of Present Illness:  [name] is a 61 yo lady with a history of ESRD on HD MWF schedule, CHF (last EF 20-25%), DM2, who presents with nausea, vomiting, diarrhea, and complaints of generalized weakness after missing dialysis yesterday. She states that she was feeling too sick to go to dialysis yesterday. She has had diarrhea and nausea for the past 4 days, vomiting that started today before arrival to the ED. She endorses decreased appetite, some shortness of breath, swelling in her left arm, and abdominal pain "from her pancreatitis." She also states that her belly hurts and she thinks she is due for a paracentesis, which normally gets done at [facility] interventional radiology for her "fatty liver."  Denies hx of alcohol abuse. Former smoker. Denies fever, chills, chest pain. She still makes urine, about 100cc TID.  Meds:   Medication List     As of 04/21/2012  9:22 PM    ASK your doctor about these medications         carvedilol 3.125 MG tablet   Commonly known as: COREG   Take 1 tablet (3.125 mg total) by mouth 2 (two) times daily with a meal.      darbepoetin 100 MCG/0.5ML Soln   Commonly known as: ARANESP   Inject 0.5 mLs (100 mcg total) into the vein every Monday with hemodialysis.      feeding supplement (NEPRO CARB STEADY) Liqd   Take 237 mLs by mouth 3 (three) times daily between meals.      HYDROmorphone 2 MG tablet   Commonly  known as: DILAUDID   Take 1 tablet (2 mg total) by mouth 3 (three) times daily. Takes along with methadone.      methadone 5 MG tablet   Commonly known as: DOLOPHINE   Take 1 tablet (5 mg total) by mouth 3 (three) times daily. Takes along with Dilaudid.      multivitamin Tabs tablet   Take 1 tablet by mouth daily.      pantoprazole 40 MG tablet   Commonly known as: PROTONIX   Take 40 mg by mouth daily.      pravastatin 20 MG tablet   Commonly known as: PRAVACHOL   Take 20 mg by mouth at bedtime.      ranolazine 500 MG 12 hr tablet   Commonly known as: RANEXA   Take 500 mg by mouth daily.      RENAGEL 800 MG tablet   Generic drug: sevelamer   Take 800 mg by mouth 3 (three) times daily with meals.         Allergies: Allergies as of 04/21/2012 - Review Complete 04/21/2012  Allergen Reaction Noted  . Morphine and related Anaphylaxis 12/01/2010  . Codeine Itching 12/01/2010  . Lisinopril Other (See Comments) 12/01/2010  . Tylenol (acetaminophen) Other (See Comments) 04/21/2012   Past Medical History  Diagnosis Date  . Gallstone pancreatitis   . Coronary artery disease     s/p CABG 2008 with multiple  PCIs  . Ischemic cardiomyopathy   . End stage renal disease   . Diabetes mellitus   . Hypertension   . History of nonadherence to medical treatment   . LBBB (left bundle branch block)   . Hyperlipidemia   . Renal insufficiency   . CHF (congestive heart failure)   . Dialysis patient   . Anemia    Past Surgical History  Procedure Date  . Coronary artery bypass graft 2008  . Cholecystectomy   . Abdominal hysterectomy   . Tubal ligation   . Tonsillectomy   . Insert / replace / remove pacemaker     pacemaker ICD  . Esophagogastroduodenoscopy 09/15/2011    Procedure: ESOPHAGOGASTRODUODENOSCOPY (EGD);  Surgeon: Theda Belfast, MD;  Location: [facility] ENDOSCOPY;  Service: Endoscopy;  Laterality: N/A;   Family History  Problem Relation Age of Onset  . Coronary artery disease  Mother   . Hypertension Mother   . Diabetes Mother   . Diabetes Sister   . Anesthesia problems Neg Hx    History   Social History  . Marital Status: Single    Spouse Name: N/A    Number of Children: N/A  . Years of Education: N/A   Occupational History  . Not on file.   Social History Main Topics  . Smoking status: Former Smoker    Quit date: 06/15/2000  . Smokeless tobacco: Not on file  . Alcohol Use: No  . Drug Use: No  . Sexually Active: Not on file   Other Topics Concern  . Not on file   Social History Narrative   The patient lives alone in [place].  She is a retired nurse-Psychiatric and ortho.  She is disabled and receives most of her care from the [place], [place].  Son-shawn-[phone] alone    Review of Systems: Pertinent items noted in HPI   Physical Exam Blood pressure 146/86, pulse 76, temperature 98.2 F (36.8 C), temperature source Oral, resp. rate 17, SpO2 99.00%. General:  No acute distress, alert and oriented x 3, well-appearing  HEENT:  PERRL, EOMI, moist mucous membranes Cardiovascular:  Regular rate and rhythm, 3/6 SEM Respiratory:  Clear to auscultation bilaterally, no wheezes, rales, or rhonchi Chest: HD cath noted on R side of chest, well healed sternotomy scar noted, pacer over L chest, nontender Abdomen:  Slightly firm, PD scar noted, tender to palpation, mostly in RU and RLQ, + bowel sounds LUE: s/p AVG placement 2 weeks ago. Surgical site with steri strips in place, healing incision, no pus or blood draining, large surrounding area of induration, no erythema or warmth, 2+ pitting edema throughout entire UE, hard to appreciate pulses 2/2 swelling, extremity is warm, appears well perfused RUE: no edema, good pulses Lower extremities: 2+ pitting edema b/l, good DP pulse, slightly cool to touch Skin: Warm, dry, no rashes Neuro: Not anxious appearing, no depressed mood, normal affect MSK: strength is 5/5 upper and lower extremities, no  focal deficits  Lab results: Basic Metabolic Panel:  Basename 04/21/12 1758 04/21/12 1416  NA 131* 129*  K 6.6* 6.4*  CL 97 92*  CO2 20 22  GLUCOSE 92 101*  BUN 87* 83*  CREATININE 8.68* 8.46*  CALCIUM 10.7* 11.0*  MG -- --  PHOS -- --   Liver Function Tests:  Basename 04/21/12 1416  AST 13  ALT <5  ALKPHOS 100  BILITOT 0.4  PROT 7.9  ALBUMIN 2.6*    Basename 04/21/12 1416  LIPASE 16  AMYLASE --  CBC:  Basename 04/21/12 1416  WBC 13.1*  NEUTROABS 11.0*  HGB 9.9*  HCT 32.3*  MCV 87.5  PLT 199   Cardiac Enzymes:  Basename 04/21/12 1419  CKTOTAL --  CKMB --  CKMBINDEX --  TROPONINI <0.30   BNP:  Basename 04/21/12 1419  PROBNP >70000.0*     Imaging results:  Dg Abd Acute W/chest  04/21/2012  *RADIOLOGY REPORT*  Clinical Data: Abdominal pain, nausea and vomiting  ACUTE ABDOMEN SERIES (ABDOMEN 2 VIEW & CHEST 1 VIEW)  Comparison: 04/01/2012; chest radiograph - 04/04/2012  Findings:  Grossly unchanged enlarged cardiac silhouette and mediastinal contours post median sternotomy and CABG.  Stable positioning of support apparatus.  No focal parenchymal opacities. Pulmonary venous congestion without frank evidence of edema.  No definite pleural effusion or pneumothorax.  Nonobstructive bowel gas pattern.  No pneumoperitoneum, pneumatosis or portal venous gas.  An enteric staple line overlies the left mid hemiabdomen.  Post cholecystectomy.  No acute osseous abnormalities.  IMPRESSION: 1.  Nonobstructive bowel gas pattern.  2.  Stable enlargement of the cardiac silhouette and mediastinal contours without definite evidence of pulmonary edema.   Original Report Authenticated By: Tacey Ruiz, MD     Other results: EKG: no peaked T waves, LBBB,   Assessment & Plan by Problem:  Ms. Broden is a 61 yo lady with a history of ESRD on HD MWF schedule, CHF (last EF 20-25%), DM2, who presents with nausea, vomiting, diarrhea, and LUE swelling after missing dialysis  yesterday.  #: Nausea and vomiting: Most likely due to viral gastroenteritis. Other differential diagnoses include: uremia 2/2 missed dialysis, concern for spontaneous bacterial peritonitis given reported hx of "fatty liver" and frequent abd paracentesis + abdominal tenderness, pancreatitis (unlikely given normal lipase); cholecystitis (less likely given normal LFTs) and diverticulitis (less likely, given no typical LLQ pain). Due to missed hemodialysis, patient is currently fluid overloaded rather than dehydrated.  Plan -will admit to tele bed for observation.  -will treat her symptomatically with Zofran for nausea -will cover with ceftriaxone for potential SBP -will check orthostatic sign -follow up BMP -will obtain blood cultures -will check UA  #: Hyperkalemia: Potassium 6.4 in the ED, without significant EKG changes. Repeated potassium 6.6. This is obviously due to missed hemodialysis. Renal was consulted and will do hemodialysis tonight.  #: Chronic kidney disease on hemodialysis (MWF): Renal was consulted, Dr. Briant Cedar evaluated the patient and will start hemodialysis tonight. appreciated renal's consultation. Will continue Sevelamer and put patient on renal diet.  # Hx of "Fatty Liver" with Ascites Pt states she is s/p esophageal variceal banding procedure, though we do not have records of that. Last EGD with abnormal mass and gastritis, but no varices noted. She states she is followed at [facility] where she undergoes intermittent abdominal paracentesis usually every 2 weeks, though for the past 2 months, she has had no fluid on ultrasound so they have not tapped her recently. She does complain of increased abdominal pain and feels like the fluid is building up. LFTs WNL. Abd tenderness on exam. She is afebrile but does have a white count of 13.1. Her nausea and vomiting may be related to ascites, also considering spontaneous bacterial peritonitis.  -will cover with ceftriaxone  2g -consider abdominal ultrasound in AM  #: HTN: Blood pressure is well controlled. Today blood pressure is 133/95 ED. Will continue her home Coreg 3.125 mg daily, which is also for her CHF.   #: CHF: stable. Hx of EF 20-25% on  ECHo from 10/07/11. Chest x-ray without any acute pulmonary edema. On auscultation, lungs are clear bilaterally. Will continue home regimen (Coreg 3.125 mg daily) and ASA.  #: Left arm swelling: left AVG was placed 2 wks ago and arm has been swollen since. Differential diagnoses include venous thromboses, venous stenoses and less likely infection (giving there is no significant redness or tenderness locally).  -Vascular surgeon was consulted, will come to see patient. -will get [place] of left arm to evaluate for patency and thrombosis  #: DVT PPX: heparin  #Diet -renal   [name] 04/21/2012, 9:22 PM

## 2012-04-21 NOTE — ED Notes (Signed)
multiple attempts to start IV without success. IV team aware

## 2012-04-21 NOTE — ED Notes (Signed)
MD at bedside. 

## 2012-04-21 NOTE — Consult Note (Signed)
[name] is an 61 y.o. female referred by Dr Conley Rolls   Chief Complaint: Missed HD.  K 6.6 HPI: 62 yo BF with ESRD ? Sec DM/HTN developed n/v/d on tues and felt too weak to go to HD yest.  Similar admission last month.  In ER K 6.4 and repeat 6.6.  Normally dialyzes on mwf for 4 hrs thru rt permcath.  Had Lt AVG placed 2 wks ago and arm has been swollen since.  Past Medical History  Diagnosis Date  . Gallstone pancreatitis   . Coronary artery disease     s/p CABG 2008 with multiple PCIs  . Ischemic cardiomyopathy   . End stage renal disease   . Diabetes mellitus   . Hypertension   . History of nonadherence to medical treatment   . LBBB (left bundle branch block)   . Hyperlipidemia   . Renal insufficiency   . CHF (congestive heart failure)   . Dialysis patient   . Anemia     Past Surgical History  Procedure Date  . Coronary artery bypass graft 2008  . Cholecystectomy   . Abdominal hysterectomy   . Tubal ligation   . Tonsillectomy   . Insert / replace / remove pacemaker     pacemaker ICD  . Esophagogastroduodenoscopy 09/15/2011    Procedure: ESOPHAGOGASTRODUODENOSCOPY (EGD);  Surgeon: Theda Belfast, MD;  Location: [facility] ENDOSCOPY;  Service: Endoscopy;  Laterality: N/A;    Family History  Problem Relation Age of Onset  . Coronary artery disease Mother   . Hypertension Mother   . Diabetes Mother   . Diabetes Sister   . Anesthesia problems Neg Hx    Social History:  reports that she quit smoking about 11 years ago. She does not have any smokeless tobacco history on file. She reports that she does not drink alcohol or use illicit drugs.  Allergies:  Allergies  Allergen Reactions  . Morphine And Related Anaphylaxis  . Codeine Itching  . Lisinopril Other (See Comments)    unknown  . Tylenol (Acetaminophen) Other (See Comments)    Patient states that the doctor told her she has a Fatty Liver.     (Not in a hospital admission)   Lab Results: UA: na  Basename 04/21/12  1416  WBC 13.1*  HGB 9.9*  HCT 32.3*  PLT 199   BMET  Basename 04/21/12 1758 04/21/12 1416  NA 131* 129*  K 6.6* 6.4*  CL 97 92*  CO2 20 22  GLUCOSE 92 101*  BUN 87* 83*  CREATININE 8.68* 8.46*  CALCIUM 10.7* 11.0*  PHOS -- --   LFT  Basename 04/21/12 1416  PROT 7.9  ALBUMIN 2.6*  AST 13  ALT <5  ALKPHOS 100  BILITOT 0.4  BILIDIR --  IBILI --   Dg Abd Acute W/chest  04/21/2012  *RADIOLOGY REPORT*  Clinical Data: Abdominal pain, nausea and vomiting  ACUTE ABDOMEN SERIES (ABDOMEN 2 VIEW & CHEST 1 VIEW)  Comparison: 04/01/2012; chest radiograph - 04/04/2012  Findings:  Grossly unchanged enlarged cardiac silhouette and mediastinal contours post median sternotomy and CABG.  Stable positioning of support apparatus.  No focal parenchymal opacities. Pulmonary venous congestion without frank evidence of edema.  No definite pleural effusion or pneumothorax.  Nonobstructive bowel gas pattern.  No pneumoperitoneum, pneumatosis or portal venous gas.  An enteric staple line overlies the left mid hemiabdomen.  Post cholecystectomy.  No acute osseous abnormalities.  IMPRESSION: 1.  Nonobstructive bowel gas pattern.  2.  Stable  enlargement of the cardiac silhouette and mediastinal contours without definite evidence of pulmonary edema.   Original Report Authenticated By: Tacey Ruiz, MD     ROS: Poor appetite No sob No f/c/s No cp Chronic abd pain + diarrhea + edema No new rashes No ortho CO No vision changes  PHYSICAL EXAM: Blood pressure 133/95, pulse 60, temperature 98.3 F (36.8 C), temperature source Oral, resp. rate 16, SpO2 96.00%. HEENT: PERRLA EOMI NECK:Rt IJ PC.  No JVD Chest  Lt Pacermaker LUNGS:decreased BS throughout CARDIAC:RRR wo MRG ABD:+BS Mild diffuse tenderness, no rebound EXT:1+ edema  Lt forearm AVG + bruit with Lt arm swelling NEURO:CNI Ox3 no asterixis  Assessment: 1. Hyperkalemia 2. N/V/D 3. Hypercalcemia 4. 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  Anemia 6. Sec Hpth PLAN: 1. HD tonight on low K bath and low calcemium 2. Will need to get info from her Dx unit in AM 3. Will check BCx2 just to make sure her sxs are not related to bacteremia since she has a catheter. 4. Renal diet 5. 5. Renal profile in AM   [name] 04/21/2012, 7:30 PM

## 2012-04-22 ENCOUNTER — Observation Stay (HOSPITAL_COMMUNITY): Payer: Non-veteran care

## 2012-04-22 DIAGNOSIS — M7989 Other specified soft tissue disorders: Secondary | ICD-10-CM

## 2012-04-22 DIAGNOSIS — N189 Chronic kidney disease, unspecified: Secondary | ICD-10-CM

## 2012-04-22 DIAGNOSIS — R197 Diarrhea, unspecified: Secondary | ICD-10-CM

## 2012-04-22 DIAGNOSIS — E875 Hyperkalemia: Secondary | ICD-10-CM

## 2012-04-22 DIAGNOSIS — I129 Hypertensive chronic kidney disease with stage 1 through stage 4 chronic kidney disease, or unspecified chronic kidney disease: Secondary | ICD-10-CM

## 2012-04-22 LAB — URINALYSIS, ROUTINE W REFLEX MICROSCOPIC
Bilirubin Urine: NEGATIVE
Hgb urine dipstick: NEGATIVE
Nitrite: NEGATIVE
Protein, ur: 100 mg/dL — AB
Urobilinogen, UA: 0.2 mg/dL (ref 0.0–1.0)

## 2012-04-22 LAB — CBC
HCT: 25.6 % — ABNORMAL LOW (ref 36.0–46.0)
MCHC: 30.5 g/dL (ref 30.0–36.0)
MCV: 87.7 fL (ref 78.0–100.0)
Platelets: 140 10*3/uL — ABNORMAL LOW (ref 150–400)
RDW: 18.2 % — ABNORMAL HIGH (ref 11.5–15.5)
WBC: 9.9 10*3/uL (ref 4.0–10.5)

## 2012-04-22 LAB — RENAL FUNCTION PANEL
Calcium: 8.8 mg/dL (ref 8.4–10.5)
GFR calc Af Amer: 12 mL/min — ABNORMAL LOW (ref >90)
GFR calc non Af Amer: 11 mL/min — ABNORMAL LOW (ref >90)
Phosphorus: 3.4 mg/dL (ref 2.3–4.6)
Potassium: 3.9 mEq/L (ref 3.5–5.1)
Sodium: 133 mEq/L — ABNORMAL LOW (ref 135–145)

## 2012-04-22 LAB — URINE MICROSCOPIC-ADD ON

## 2012-04-22 MED ORDER — SIMVASTATIN 20 MG PO TABS
20.0000 mg | ORAL_TABLET | Freq: Every day | ORAL | Status: DC
  Administered 2012-04-22 – 2012-04-23 (×2): 20 mg via ORAL
  Filled 2012-04-22 (×3): qty 1

## 2012-04-22 MED ORDER — RENA-VITE PO TABS
1.0000 | ORAL_TABLET | Freq: Every day | ORAL | Status: DC
Start: 2012-04-22 — End: 2012-04-22
  Administered 2012-04-22: 1 via ORAL
  Filled 2012-04-22: qty 1

## 2012-04-22 MED ORDER — CARVEDILOL 3.125 MG PO TABS
3.1250 mg | ORAL_TABLET | Freq: Two times a day (BID) | ORAL | Status: DC
  Administered 2012-04-22 – 2012-04-24 (×4): 3.125 mg via ORAL
  Filled 2012-04-22 (×7): qty 1

## 2012-04-22 MED ORDER — ONDANSETRON HCL 4 MG/2ML IJ SOLN
4.0000 mg | Freq: Four times a day (QID) | INTRAMUSCULAR | Status: DC | PRN
  Administered 2012-04-22 (×2): 4 mg via INTRAVENOUS
  Filled 2012-04-22 (×2): qty 2

## 2012-04-22 MED ORDER — SODIUM CHLORIDE 0.9 % IJ SOLN
3.0000 mL | Freq: Two times a day (BID) | INTRAMUSCULAR | Status: DC
  Administered 2012-04-22 – 2012-04-24 (×2): 3 mL via INTRAVENOUS

## 2012-04-22 MED ORDER — RANOLAZINE ER 500 MG PO TB12
500.0000 mg | ORAL_TABLET | Freq: Every day | ORAL | Status: DC
  Administered 2012-04-22 – 2012-04-24 (×3): 500 mg via ORAL
  Filled 2012-04-22 (×3): qty 1

## 2012-04-22 MED ORDER — SEVELAMER CARBONATE 800 MG PO TABS
800.0000 mg | ORAL_TABLET | Freq: Three times a day (TID) | ORAL | Status: DC
  Administered 2012-04-22 – 2012-04-23 (×4): 800 mg via ORAL
  Filled 2012-04-22 (×10): qty 1

## 2012-04-22 MED ORDER — HYDROMORPHONE HCL 2 MG PO TABS
2.0000 mg | ORAL_TABLET | Freq: Three times a day (TID) | ORAL | Status: DC
  Administered 2012-04-22 – 2012-04-24 (×7): 2 mg via ORAL
  Filled 2012-04-22 (×7): qty 1

## 2012-04-22 MED ORDER — ASPIRIN EC 81 MG PO TBEC
81.0000 mg | DELAYED_RELEASE_TABLET | Freq: Every day | ORAL | Status: DC
  Administered 2012-04-24: 81 mg via ORAL
  Filled 2012-04-22 (×3): qty 1

## 2012-04-22 MED ORDER — SODIUM CHLORIDE 0.9 % IJ SOLN: 3.0000 mL | INTRAMUSCULAR | Status: DC | PRN

## 2012-04-22 MED ORDER — METHADONE HCL 10 MG PO TABS
5.0000 mg | ORAL_TABLET | Freq: Three times a day (TID) | ORAL | Status: DC
  Administered 2012-04-22: 10 mg via ORAL
  Administered 2012-04-22 – 2012-04-24 (×6): 5 mg via ORAL
  Filled 2012-04-22: qty 1
  Filled 2012-04-22: qty 2
  Filled 2012-04-22 (×5): qty 1

## 2012-04-22 MED ORDER — IOHEXOL 300 MG/ML  SOLN: 20.0000 mL | INTRAMUSCULAR | Status: AC

## 2012-04-22 MED ORDER — HYDROMORPHONE HCL 2 MG PO TABS
ORAL_TABLET | ORAL | Status: AC
  Filled 2012-04-22: qty 1

## 2012-04-22 MED ORDER — METRONIDAZOLE 250 MG PO TABS
250.0000 mg | ORAL_TABLET | Freq: Two times a day (BID) | ORAL | Status: DC
  Administered 2012-04-22 – 2012-04-24 (×4): 250 mg via ORAL
  Filled 2012-04-22 (×6): qty 1

## 2012-04-22 MED ORDER — SODIUM CHLORIDE 0.9 % IJ SOLN
3.0000 mL | Freq: Two times a day (BID) | INTRAMUSCULAR | Status: DC
  Administered 2012-04-22: via INTRAVENOUS
  Administered 2012-04-23: 3 mL via INTRAVENOUS

## 2012-04-22 MED ORDER — HYDROMORPHONE HCL PF 1 MG/ML IJ SOLN
0.5000 mg | INTRAMUSCULAR | Status: DC | PRN
  Administered 2012-04-22: 0.5 mg via INTRAVENOUS
  Filled 2012-04-22: qty 1

## 2012-04-22 MED ORDER — HEPARIN SODIUM (PORCINE) 5000 UNIT/ML IJ SOLN
5000.0000 [IU] | Freq: Three times a day (TID) | INTRAMUSCULAR | Status: DC
  Administered 2012-04-22 – 2012-04-24 (×5): 5000 [IU] via SUBCUTANEOUS
  Filled 2012-04-22 (×10): qty 1

## 2012-04-22 MED ORDER — PANTOPRAZOLE SODIUM 40 MG PO TBEC
40.0000 mg | DELAYED_RELEASE_TABLET | Freq: Every day | ORAL | Status: DC
  Administered 2012-04-22 – 2012-04-24 (×3): 40 mg via ORAL
  Filled 2012-04-22 (×3): qty 1

## 2012-04-22 MED ORDER — SODIUM CHLORIDE 0.9 % IV SOLN: 250.0000 mL | INTRAVENOUS | Status: DC | PRN

## 2012-04-22 MED ORDER — HYDROMORPHONE HCL PF 1 MG/ML IJ SOLN
0.5000 mg | Freq: Once | INTRAMUSCULAR | Status: AC
  Administered 2012-04-22: 0.5 mg via INTRAVENOUS
  Filled 2012-04-22: qty 1

## 2012-04-22 MED ORDER — RENA-VITE PO TABS
1.0000 | ORAL_TABLET | Freq: Every day | ORAL | Status: DC
  Administered 2012-04-22 – 2012-04-23 (×2): 1 via ORAL
  Filled 2012-04-22 (×3): qty 1

## 2012-04-22 NOTE — H&P (Signed)
Internal Medicine Teaching Service Attending Note Date: 04/22/2012  Patient name: [name]  Medical record number: [id number]  Date of birth: 06-11-1951   I have seen and evaluated Threasa Heads and discussed their care with the Residency Team.    Ms. Burrowes is a 61yo woman with PMH of ESRD on HD, chronic pancreatitis, CAD, DM, HTN, CHF who presented to the [facility] ED complaining of 1 week of fatigue, decreased appetite and just not feeling right.  She presented to HD on Monday but finished her course earlier and due to not feeling well, she missed dialysis on Wednesday.  Subsequently she developed shortness of breath, nausea, vomiting and called EMS.  She notes that she never vomits and this had her concerned.  She also reports abdominal pain, which she attributes to her chronic pancreatitis, however, the pain is in her suprapubic region and radiates to her flanks.  She also notes that she recently had a AVF placed in the left arm and since then she has noticed increased swelling of that arm beyond the AVF.  Her belly pain is sharp, mainly in the suprapubic area, 8/10 and relieved with narcotics.  She continues to make urine and denies dysuria, foul smelling urine.  She also has a history of "fatty liver" and ascites for which she has gotten multiple paracenteses in the past, but not in the last couple of months.    She specifically denies fever, chills, chest pain, constipation, change in vision.    She is a former smoker and denies ETOH use  For further PMH, PSH, PFSH, ROS and meds/allergies, please see resident note.   Physical Exam: Blood pressure 101/46, pulse 66, temperature 99.2 F (37.3 C), temperature source Oral, resp. rate 18, height 5\' 4"  (1.626 m), weight 120 lb 5.9 oz (54.6 kg), SpO2 95.00%. General appearance: alert, cooperative, fatigued and no distress Head: Normocephalic, without obvious abnormality, atraumatic Eyes: anicteric, EOMI Back: + tenderness in bilateral  CVA Lungs: clear to auscultation bilaterally Heart: RR, NR, 3/6 SEM heard Abdomen: + TTP to light and deep palpation suprapubic, otherwise without tenderness, +BS, she intermittently has voluntary guarding, no rebound Extremities: AVF noted in LUE with thrill, 2+ pitting edema to LUE, + warmth, difficult to assess for erythema due to skin color Pulses: difficult to palpate radial in the LUE due to edema, otherwise 2+  Skin: + permcath in right upper chest, no redness, no tenderness, well healed sternotomy scar; PC noted in L upper chest Neurologic: Grossly normal  Lab results: Results for orders placed during the hospital encounter of 04/21/12 (from the past 24 hour(s))  CBC WITH DIFFERENTIAL     Status: Abnormal   Collection Time   04/21/12  2:16 PM      Component Value Range   WBC 13.1 (*) 4.0 - 10.5 K/uL   RBC 3.69 (*) 3.87 - 5.11 MIL/uL   Hemoglobin 9.9 (*) 12.0 - 15.0 g/dL   HCT 40.9 (*) 81.1 - 91.4 %   MCV 87.5  78.0 - 100.0 fL   MCH 26.8  26.0 - 34.0 pg   MCHC 30.7  30.0 - 36.0 g/dL   RDW 78.2 (*) 95.6 - 21.3 %   Platelets 199  150 - 400 K/uL   Neutrophils Relative 84 (*) 43 - 77 %   Neutro Abs 11.0 (*) 1.7 - 7.7 K/uL   Lymphocytes Relative 9 (*) 12 - 46 %   Lymphs Abs 1.2  0.7 - 4.0 K/uL   Monocytes  Relative 6  3 - 12 %   Monocytes Absolute 0.8  0.1 - 1.0 K/uL   Eosinophils Relative 1  0 - 5 %   Eosinophils Absolute 0.2  0.0 - 0.7 K/uL   Basophils Relative 1  0 - 1 %   Basophils Absolute 0.1  0.0 - 0.1 K/uL  COMPREHENSIVE METABOLIC PANEL     Status: Abnormal   Collection Time   04/21/12  2:16 PM      Component Value Range   Sodium 129 (*) 135 - 145 mEq/L   Potassium 6.4 (*) 3.5 - 5.1 mEq/L   Chloride 92 (*) 96 - 112 mEq/L   CO2 22  19 - 32 mEq/L   Glucose, Bld 101 (*) 70 - 99 mg/dL   BUN 83 (*) 6 - 23 mg/dL   Creatinine, Ser 0.98 (*) 0.50 - 1.10 mg/dL   Calcium 11.9 (*) 8.4 - 10.5 mg/dL   Total Protein 7.9  6.0 - 8.3 g/dL   Albumin 2.6 (*) 3.5 - 5.2 g/dL   AST 13   0 - 37 U/L   ALT <5  0 - 35 U/L   Alkaline Phosphatase 100  39 - 117 U/L   Total Bilirubin 0.4  0.3 - 1.2 mg/dL   GFR calc non Af Amer 4 (*) >90 mL/min   GFR calc Af Amer 5 (*) >90 mL/min  LIPASE, BLOOD     Status: Normal   Collection Time   04/21/12  2:16 PM      Component Value Range   Lipase 16  11 - 59 U/L  TROPONIN I     Status: Normal   Collection Time   04/21/12  2:19 PM      Component Value Range   Troponin I <0.30  <0.30 ng/mL  PRO B NATRIURETIC PEPTIDE     Status: Abnormal   Collection Time   04/21/12  2:19 PM      Component Value Range   Pro B Natriuretic peptide (BNP) >70000.0 (*) 0 - 125 pg/mL  BASIC METABOLIC PANEL     Status: Abnormal   Collection Time   04/21/12  5:58 PM      Component Value Range   Sodium 131 (*) 135 - 145 mEq/L   Potassium 6.6 (*) 3.5 - 5.1 mEq/L   Chloride 97  96 - 112 mEq/L   CO2 20  19 - 32 mEq/L   Glucose, Bld 92  70 - 99 mg/dL   BUN 87 (*) 6 - 23 mg/dL   Creatinine, Ser 1.47 (*) 0.50 - 1.10 mg/dL   Calcium 82.9 (*) 8.4 - 10.5 mg/dL   GFR calc non Af Amer 4 (*) >90 mL/min   GFR calc Af Amer 5 (*) >90 mL/min  RENAL FUNCTION PANEL     Status: Abnormal   Collection Time   04/22/12  5:35 AM      Component Value Range   Sodium 133 (*) 135 - 145 mEq/L   Potassium 3.9  3.5 - 5.1 mEq/L   Chloride 95 (*) 96 - 112 mEq/L   CO2 27  19 - 32 mEq/L   Glucose, Bld 76  70 - 99 mg/dL   BUN 29 (*) 6 - 23 mg/dL   Creatinine, Ser 5.62 (*) 0.50 - 1.10 mg/dL   Calcium 8.8  8.4 - 13.0 mg/dL   Phosphorus 3.4  2.3 - 4.6 mg/dL   Albumin 2.0 (*) 3.5 - 5.2 g/dL   GFR calc non Af  Amer 11 (*) >90 mL/min   GFR calc Af Amer 12 (*) >90 mL/min  CBC     Status: Abnormal   Collection Time   04/22/12  5:35 AM      Component Value Range   WBC 9.9  4.0 - 10.5 K/uL   RBC 2.92 (*) 3.87 - 5.11 MIL/uL   Hemoglobin 7.8 (*) 12.0 - 15.0 g/dL   HCT 04.5 (*) 40.9 - 81.1 %   MCV 87.7  78.0 - 100.0 fL   MCH 26.7  26.0 - 34.0 pg   MCHC 30.5  30.0 - 36.0 g/dL   RDW 91.4 (*)  78.2 - 15.5 %   Platelets 140 (*) 150 - 400 K/uL    Imaging results:  Dg Abd Acute W/chest  04/21/2012  *RADIOLOGY REPORT*  Clinical Data: Abdominal pain, nausea and vomiting  ACUTE ABDOMEN SERIES (ABDOMEN 2 VIEW & CHEST 1 VIEW)  Comparison: 04/01/2012; chest radiograph - 04/04/2012  Findings:  Grossly unchanged enlarged cardiac silhouette and mediastinal contours post median sternotomy and CABG.  Stable positioning of support apparatus.  No focal parenchymal opacities. Pulmonary venous congestion without frank evidence of edema.  No definite pleural effusion or pneumothorax.  Nonobstructive bowel gas pattern.  No pneumoperitoneum, pneumatosis or portal venous gas.  An enteric staple line overlies the left mid hemiabdomen.  Post cholecystectomy.  No acute osseous abnormalities.  IMPRESSION: 1.  Nonobstructive bowel gas pattern.  2.  Stable enlargement of the cardiac silhouette and mediastinal contours without definite evidence of pulmonary edema.   Original Report Authenticated By: Tacey Ruiz, MD     Assessment and Plan: I agree with the formulated Assessment and Plan with the following changes:   1. Missed dialysis with elevated K, nausea and vomiting - HD this morning - Renal following - Monitor electrolytes, intake closely - Zofran for nausea  2. Abdominal pain, elevated WBC, fever - She was initially placed on rocephin for concern for SBP - UA/UC pending - BC X 2 - She spiked a fever despite rocephin, antibiotics will be broadened to cover complicated UTI - Concern for pyelonephritis vs. SBP vs. Bowel pathology - Monitor vitals closely for changes and serial abdominal exams needed.  - Abdominal US, patient refusing CT of abdomen at this time - AXR as above  3. UE swelling - LUE doppler to assess for clot, she also has a pacemaker on that side.  - ? Cellulitis 2/2 warmth, ? Erythema.  Monitor closely for change - Vascular team consulted  Other issues as per resident note.    Inez Catalina, MD 11/8/201310:35 AM

## 2012-04-22 NOTE — Progress Notes (Signed)
Subjective: [name] is still having abdominal pain this morning. Her nausea has improved and she has not had further vomiting. No diarrhea. She went to dialysis last night. Decreased PO intake, she does not have an appetite this morning. Her arm did start hurting last night, which is new since admission.  Objective: Vital signs in last 24 hours: Filed Vitals:   04/22/12 0000 04/22/12 0014 04/22/12 0121 04/22/12 0439  BP: 125/63 124/68 122/80 101/46  Pulse: 61 60 58 66  Temp:  98 F (36.7 C) 97.1 F (36.2 C) 99.2 F (37.3 C)  TempSrc:  Oral Oral Oral  Resp: 20 18 18 18   Height:   5\' 4"  (1.626 m)   Weight:   120 lb 5.9 oz (54.6 kg)   SpO2:  98% 100% 95%   Weight change:   Intake/Output Summary (Last 24 hours) at 04/22/12 0837 Last data filed at 04/22/12 0012  Gross per 24 hour  Intake      0 ml  Output   3000 ml  Net  -3000 ml    Physical Exam Blood pressure 101/46, pulse 66, temperature 99.2 F (37.3 C), temperature source Oral, resp. rate 18, height 5\' 4"  (1.626 m), weight 120 lb 5.9 oz (54.6 kg), SpO2 95.00%. General: No acute distress, alert and oriented x 3, well-appearing  HEENT: PERRL, EOMI, moist mucous membranes  Cardiovascular: Regular rate and rhythm, 3/6 SEM  Respiratory: Clear to auscultation bilaterally, no wheezes, rales, or rhonchi  Chest: HD cath noted on R side of chest, well healed sternotomy scar noted, pacer over L chest, nontender  Abdomen: Slightly firm, PD scar noted, tender to palpation, mostly in RU and RLQ, + bowel sounds, intermittent guarding  LUE: s/p AVG placement 2 weeks ago. Surgical site with steri strips in place, healing incision, no pus or blood draining, large surrounding area of induration, no erythema or warmth, 2+ pitting edema throughout entire UE, slightly improved from yestserday, hard to appreciate pulses 2/2 swelling, extremity is warm, appears well perfused  RUE: no edema, good pulses  Lower extremities: 1+ pitting edema  b/l, good DP pulse Skin: Warm, dry, no rashes  Neuro: Not anxious appearing, no depressed mood, normal affect  MSK: strength is 5/5 upper and lower extremities, no focal deficits   Lab Results: CBC    Component Value Date/Time   WBC 9.9 04/22/2012 0535   WBC 6.7 04/08/2011 1054   RBC 2.92* 04/22/2012 0535   RBC 3.40* 04/08/2011 1054   HGB 7.8* 04/22/2012 0535   HGB 10.1* 04/08/2011 1054   HCT 25.6* 04/22/2012 0535   HCT 30.1* 04/08/2011 1054   PLT 140* 04/22/2012 0535   PLT 232 04/08/2011 1054   MCV 87.7 04/22/2012 0535   MCV 88.6 04/08/2011 1054   MCH 26.7 04/22/2012 0535   MCH 29.7 04/08/2011 1054   MCHC 30.5 04/22/2012 0535   MCHC 33.5 04/08/2011 1054   RDW 18.2* 04/22/2012 0535   RDW 20.4* 04/08/2011 1054   LYMPHSABS 1.2 04/21/2012 1416   LYMPHSABS 1.4 04/08/2011 1054   MONOABS 0.8 04/21/2012 1416   MONOABS 0.4 04/08/2011 1054   EOSABS 0.2 04/21/2012 1416   EOSABS 0.2 04/08/2011 1054   BASOSABS 0.1 04/21/2012 1416   BASOSABS 0.0 04/08/2011 1054    BMET    Component Value Date/Time   NA 133* 04/22/2012 0535   K 3.9 04/22/2012 0535   CL 95* 04/22/2012 0535   CO2 27 04/22/2012 0535   GLUCOSE 76 04/22/2012 0535  BUN 29* 04/22/2012 0535   CREATININE 4.15* 04/22/2012 0535   CALCIUM 8.8 04/22/2012 0535   GFRNONAA 11* 04/22/2012 0535   GFRAA 12* 04/22/2012 0535     Micro Results: No results found for this or any previous visit (from the past 240 hour(s)).  Studies/Results: Dg Abd Acute W/chest  04/21/2012  *RADIOLOGY REPORT*  Clinical Data: Abdominal pain, nausea and vomiting  ACUTE ABDOMEN SERIES (ABDOMEN 2 VIEW & CHEST 1 VIEW)  Comparison: 04/01/2012; chest radiograph - 04/04/2012  Findings:  Grossly unchanged enlarged cardiac silhouette and mediastinal contours post median sternotomy and CABG.  Stable positioning of support apparatus.  No focal parenchymal opacities. Pulmonary venous congestion without frank evidence of edema.  No definite pleural effusion or pneumothorax.   Nonobstructive bowel gas pattern.  No pneumoperitoneum, pneumatosis or portal venous gas.  An enteric staple line overlies the left mid hemiabdomen.  Post cholecystectomy.  No acute osseous abnormalities.  IMPRESSION: 1.  Nonobstructive bowel gas pattern.  2.  Stable enlargement of the cardiac silhouette and mediastinal contours without definite evidence of pulmonary edema.   Original Report Authenticated By: Tacey Ruiz, MD     Medications: medications reviewed Scheduled Meds:   . aspirin EC  81 mg Oral Daily  . [COMPLETED] calcium gluconate 1 GM IV  1 g Intravenous To ER  . carvedilol  3.125 mg Oral BID WC  . cefTRIAXone (ROCEPHIN)  IV  2 g Intravenous Q24H  . heparin  5,000 Units Subcutaneous Q8H  . [COMPLETED]  HYDROmorphone (DILAUDID) injection  0.5 mg Intravenous Once  . [COMPLETED]  HYDROmorphone (DILAUDID) injection  0.5 mg Intravenous Once  . [COMPLETED]  HYDROmorphone (DILAUDID) injection  1 mg Intravenous Once  . HYDROmorphone  2 mg Oral TID  . methadone  5 mg Oral TID  . multivitamin  1 tablet Oral Daily  . [COMPLETED] ondansetron (ZOFRAN) IV  4 mg Intravenous Once  . pantoprazole  40 mg Oral Daily  . ranolazine  500 mg Oral Daily  . sevelamer  800 mg Oral TID WC  . simvastatin  20 mg Oral q1800  . [COMPLETED] sodium bicarbonate  50 mEq Intravenous Once  . sodium chloride  3 mL Intravenous Q12H  . sodium chloride  3 mL Intravenous Q12H   Continuous Infusions:  PRN Meds:.sodium chloride, ondansetron, sodium chloride  Assessment/Plan:  [name] is a 61 yo lady with a history of ESRD on HD MWF schedule, CHF (last EF 20-25%), DM2, who presents with nausea, vomiting, diarrhea, and LUE swelling after missing dialysis yesterday.   #: Abdominal Pain: Concern mostly for SBP vs UTI/Pyelo. UA not collected yet, encouraged patient to provide sample Differential diagnoses include: viral gastroenteritis, uremia 2/2 missed dialysis, concern for spontaneous bacterial peritonitis given  reported hx of "fatty liver" and frequent abd paracentesis + abdominal tenderness, pancreatitis (unlikely given normal lipase); cholecystitis (less likely given normal LFTs) and diverticulitis (less likely, given no typical LLQ pain).   Plan  *CT abdomen w/ and w/o contrast today -IV dilaudid for breakthrough pain -zofran PRN for nausea -cont ceftriaxone, would cover SBP and UTI -follow up BMP  -blood cultures pending  #: Hyperkalemia: RESOLVED. Potassium 6.6 on admission, without significant EKG changes. Most likely 2/2 missed hemodialysis. Renal following, HD done on the evening of 11/7. K was 3.9 this morning after HD. -cont to monitor BMP  #: Chronic kidney disease on hemodialysis (MWF): Renal was consulted, Dr. Briant Cedar evaluated the patient and will start hemodialysis tonight. appreciated renal's consultation. Will  continue Sevelamer and put patient on renal diet.  -will need to get back on MWF schedule as outpatient  # Possible spontaneous bacterial peritonitis, in setting of "Fatty Liver" with Ascites  Pt states she is s/p esophageal variceal banding procedure, though we do not have records of that. Last EGD with abnormal mass and gastritis, but no varices noted. She states she is followed at [facility] where she undergoes intermittent abdominal paracentesis usually every 2 weeks, though for the past 2 months, she has had no fluid on ultrasound so they have not tapped her recently. She does complain of increased abdominal pain and feels like the fluid is building up. LFTs WNL. Abd tenderness on exam. She is afebrile but does have a white count of 13.1. Her nausea and vomiting may be related to ascites, also considering spontaneous bacterial peritonitis.  -cont ceftriaxone 2g for SBP -f/u abd CT  #: HTN: Blood pressure is well controlled. Today blood pressure is 133/95 ED. Will continue her home Coreg 3.125 mg daily, which is also for her CHF.  -BP stable after HD last night -cont to  monitor  #: CHF: stable. Hx of EF 20-25% on ECHo from 10/07/11. Chest x-ray without any acute pulmonary edema. On auscultation, lungs are clear bilaterally. Will continue home regimen (Coreg 3.125 mg daily) and ASA.   #: Left arm swelling: left AVG was placed 2 wks ago and arm has been swollen since. Differential diagnoses include venous thromboses, venous stenoses and less likely infection (giving there is no significant redness or tenderness locally). Evaluation by vascular surgeon last night. Thought to be consistent with normal post op changes after AVG placement. May need fistulogram to evaluate for central venous stenosis  -f/u US of left arm to evaluate for thrombosis  -encourage elevation of that arm  #: DVT PPX: heparin   #Diet  -renal      LOS: 1 day   [place] Ar 04/22/2012, 8:37 AM

## 2012-04-22 NOTE — Progress Notes (Signed)
[facility] ROUNDING NOTE   Subjective:   Interval History:none  Objective:  Vital signs in last 24 hours:  Temp:  [97.1 F (36.2 C)-99.2 F (37.3 C)] 99.2 F (37.3 C) (11/08 0439) Pulse Rate:  [58-76] 66  (11/08 0439) Resp:  [16-26] 18  (11/08 0439) BP: (101-146)/(46-95) 101/46 mmHg (11/08 0439) SpO2:  [95 %-100 %] 95 % (11/08 0439) Weight:  [54.6 kg (120 lb 5.9 oz)] 54.6 kg (120 lb 5.9 oz) (11/08 0121)  Weight change:  Filed Weights   04/22/12 0121  Weight: 54.6 kg (120 lb 5.9 oz)    Intake/Output: I/O last 3 completed shifts: In: -  Out: 3000 [Other:3000]   Intake/Output this shift:     NECK:Rt IJ PC. No JVD  Chest Lt Pacermaker  LUNGS:decreased BS throughout  CARDIAC:RRR wo MRG  ABD:+BS Mild diffuse tenderness, no rebound  EXT:1+ edema Lt forearm AVG + bruit with Lt arm swelling  NEURO:CNI Ox3 no asterixis  Basic Metabolic Panel:  Lab 04/22/12 4098 04/21/12 1758 04/21/12 1416  NA 133* 131* 129*  K 3.9 6.6* 6.4*  CL 95* 97 92*  CO2 27 20 22   GLUCOSE 76 92 101*  BUN 29* 87* 83*  CREATININE 4.15* 8.68* 8.46*  CALCIUM 8.8 10.7* 11.0*  MG -- -- --  PHOS 3.4 -- --    Liver Function Tests:  Lab 04/22/12 0535 04/21/12 1416  AST -- 13  ALT -- <5  ALKPHOS -- 100  BILITOT -- 0.4  PROT -- 7.9  ALBUMIN 2.0* 2.6*    Lab 04/21/12 1416  LIPASE 16  AMYLASE --   No results found for this basename: AMMONIA:3 in the last 168 hours  CBC:  Lab 04/22/12 0535 04/21/12 1416  WBC 9.9 13.1*  NEUTROABS -- 11.0*  HGB 7.8* 9.9*  HCT 25.6* 32.3*  MCV 87.7 87.5  PLT 140* 199    Cardiac Enzymes:  Lab 04/21/12 1419  CKTOTAL --  CKMB --  CKMBINDEX --  TROPONINI <0.30    BNP: No components found with this basename: POCBNP:5  CBG: No results found for this basename: GLUCAP:5 in the last 168 hours  Microbiology: Results for orders placed during the hospital encounter of 04/01/12  MRSA PCR SCREENING     Status: Normal   Collection Time    04/02/12  7:27 AM      Component Value Range Status Comment   MRSA by PCR NEGATIVE  NEGATIVE Final     Coagulation Studies: No results found for this basename: LABPROT:5,INR:5 in the last 72 hours  Urinalysis: No results found for this basename: COLORURINE:2,APPERANCEUR:2,LABSPEC:2,PHURINE:2,GLUCOSEU:2,HGBUR:2,BILIRUBINUR:2,KETONESUR:2,PROTEINUR:2,UROBILINOGEN:2,NITRITE:2,LEUKOCYTESUR:2 in the last 72 hours    Imaging: Dg Abd Acute W/chest  04/21/2012  *RADIOLOGY REPORT*  Clinical Data: Abdominal pain, nausea and vomiting  ACUTE ABDOMEN SERIES (ABDOMEN 2 VIEW & CHEST 1 VIEW)  Comparison: 04/01/2012; chest radiograph - 04/04/2012  Findings:  Grossly unchanged enlarged cardiac silhouette and mediastinal contours post median sternotomy and CABG.  Stable positioning of support apparatus.  No focal parenchymal opacities. Pulmonary venous congestion without frank evidence of edema.  No definite pleural effusion or pneumothorax.  Nonobstructive bowel gas pattern.  No pneumoperitoneum, pneumatosis or portal venous gas.  An enteric staple line overlies the left mid hemiabdomen.  Post cholecystectomy.  No acute osseous abnormalities.  IMPRESSION: 1.  Nonobstructive bowel gas pattern.  2.  Stable enlargement of the cardiac silhouette and mediastinal contours without definite evidence of pulmonary edema.   Original Report Authenticated By: Tacey Ruiz, MD  Medications:        . aspirin EC  81 mg Oral Daily  . [COMPLETED] calcium gluconate 1 GM IV  1 g Intravenous To ER  . carvedilol  3.125 mg Oral BID WC  . cefTRIAXone (ROCEPHIN)  IV  2 g Intravenous Q24H  . heparin  5,000 Units Subcutaneous Q8H  . [COMPLETED]  HYDROmorphone (DILAUDID) injection  0.5 mg Intravenous Once  . [COMPLETED]  HYDROmorphone (DILAUDID) injection  0.5 mg Intravenous Once  . [COMPLETED]  HYDROmorphone (DILAUDID) injection  1 mg Intravenous Once  . HYDROmorphone  2 mg Oral TID  . methadone  5 mg Oral TID  .  multivitamin  1 tablet Oral Daily  . [COMPLETED] ondansetron (ZOFRAN) IV  4 mg Intravenous Once  . pantoprazole  40 mg Oral Daily  . ranolazine  500 mg Oral Daily  . sevelamer  800 mg Oral TID WC  . simvastatin  20 mg Oral q1800  . [COMPLETED] sodium bicarbonate  50 mEq Intravenous Once  . sodium chloride  3 mL Intravenous Q12H  . sodium chloride  3 mL Intravenous Q12H   sodium chloride, ondansetron, sodium chloride  Assessment/ Plan:    61 yo BF with ESRD ? Sec DM/HTN developed n/v/d on tues and felt too weak to go to HD yest. Similar admission last month. In ER K 6.4 and repeat 6.6. Normally dialyzes on mwf for 4 hrs thru rt permcath. Had Lt AVG placed 2 wks ago and arm has been swollen since.  ESRD  MWF  Off schedule at present will plan dialysis in AM  HTN   Stable  Anemia  Will need ESA and will evaluate outpatient records  Hb 7.8  Bones   Calcium improved  Hyperkalemia better with dilaysis     LOS: 1 [name] W @TODAY @11 :48 AM

## 2012-04-23 ENCOUNTER — Observation Stay (HOSPITAL_COMMUNITY): Payer: Non-veteran care

## 2012-04-23 LAB — RENAL FUNCTION PANEL
Albumin: 2.1 g/dL — ABNORMAL LOW (ref 3.5–5.2)
BUN: 39 mg/dL — ABNORMAL HIGH (ref 6–23)
Creatinine, Ser: 5.16 mg/dL — ABNORMAL HIGH (ref 0.50–1.10)
Phosphorus: 4.5 mg/dL (ref 2.3–4.6)

## 2012-04-23 LAB — CBC
HCT: 25.9 % — ABNORMAL LOW (ref 36.0–46.0)
MCH: 26.5 pg (ref 26.0–34.0)
MCHC: 30.1 g/dL (ref 30.0–36.0)
MCV: 88.1 fL (ref 78.0–100.0)
RDW: 18.3 % — ABNORMAL HIGH (ref 11.5–15.5)

## 2012-04-23 LAB — URINE CULTURE

## 2012-04-23 MED ORDER — DARBEPOETIN ALFA-POLYSORBATE 100 MCG/0.5ML IJ SOLN
100.0000 ug | Freq: Once | INTRAMUSCULAR | Status: AC
  Administered 2012-04-23: 100 ug via INTRAVENOUS
  Filled 2012-04-23: qty 0.5

## 2012-04-23 MED ORDER — MUPIROCIN 2 % EX OINT
1.0000 "application " | TOPICAL_OINTMENT | Freq: Two times a day (BID) | CUTANEOUS | Status: DC
  Filled 2012-04-23: qty 22

## 2012-04-23 MED ORDER — DARBEPOETIN ALFA-POLYSORBATE 100 MCG/0.5ML IJ SOLN
INTRAMUSCULAR | Status: AC
  Administered 2012-04-23: 100 ug via INTRAVENOUS
  Filled 2012-04-23: qty 0.5

## 2012-04-23 MED ORDER — NEPRO/CARBSTEADY PO LIQD: 237.0000 mL | Freq: Three times a day (TID) | ORAL | Status: DC

## 2012-04-23 MED ORDER — CHLORHEXIDINE GLUCONATE CLOTH 2 % EX PADS
6.0000 | MEDICATED_PAD | Freq: Every day | CUTANEOUS | Status: DC
  Administered 2012-04-23: 6 via TOPICAL

## 2012-04-23 MED ORDER — NEPRO/CARBSTEADY PO LIQD
237.0000 mL | Freq: Two times a day (BID) | ORAL | Status: DC
  Administered 2012-04-23 – 2012-04-24 (×2): 237 mL via ORAL

## 2012-04-23 MED ORDER — DOCUSATE SODIUM 100 MG PO CAPS
100.0000 mg | ORAL_CAPSULE | Freq: Two times a day (BID) | ORAL | Status: DC
  Filled 2012-04-23 (×2): qty 1

## 2012-04-23 NOTE — Progress Notes (Signed)
[facility] ROUNDING NOTE   Subjective:   Interval History: swelling improved  Objective:  Vital signs in last 24 hours:  Temp:  [98 F (36.7 C)-101.5 F (38.6 C)] 98 F (36.7 C) (11/09 0935) Pulse Rate:  [71-86] 71  (11/09 0935) Resp:  [18] 18  (11/09 0935) BP: (106-131)/(56-83) 131/56 mmHg (11/09 0935) SpO2:  [96 %-98 %] 98 % (11/09 0935)  Weight change:  Filed Weights   04/22/12 0121  Weight: 54.6 kg (120 lb 5.9 oz)    Intake/Output: I/O last 3 completed shifts: In: 360 [P.O.:360] Out: 3000 [Other:3000]   Intake/Output this shift:  Total I/O In: 120 [P.O.:120] Out: -   NECK:Rt IJ PC. No JVD  Chest Lt Pacermaker  LUNGS:decreased BS throughout  CARDIAC:RRR wo MRG  ABD:+BS Mild diffuse tenderness, no rebound  EXT:1+ edema Lt forearm AVG + bruit with Lt arm swelling  NEURO:CNI Ox3 no asterixis    Basic Metabolic Panel:  Lab 04/22/12 6578 04/21/12 1758 04/21/12 1416  NA 133* 131* 129*  K 3.9 6.6* 6.4*  CL 95* 97 92*  CO2 27 20 22   GLUCOSE 76 92 101*  BUN 29* 87* 83*  CREATININE 4.15* 8.68* 8.46*  CALCIUM 8.8 10.7* 11.0*  MG -- -- --  PHOS 3.4 -- --    Liver Function Tests:  Lab 04/22/12 0535 04/21/12 1416  AST -- 13  ALT -- <5  ALKPHOS -- 100  BILITOT -- 0.4  PROT -- 7.9  ALBUMIN 2.0* 2.6*    Lab 04/21/12 1416  LIPASE 16  AMYLASE --   No results found for this basename: AMMONIA:3 in the last 168 hours  CBC:  Lab 04/22/12 0535 04/21/12 1416  WBC 9.9 13.1*  NEUTROABS -- 11.0*  HGB 7.8* 9.9*  HCT 25.6* 32.3*  MCV 87.7 87.5  PLT 140* 199    Cardiac Enzymes:  Lab 04/21/12 1419  CKTOTAL --  CKMB --  CKMBINDEX --  TROPONINI <0.30    BNP: No components found with this basename: POCBNP:5  CBG: No results found for this basename: GLUCAP:5 in the last 168 hours  Microbiology: Results for orders placed during the hospital encounter of 04/01/12  MRSA PCR SCREENING     Status: Normal   Collection Time   04/02/12   7:27 AM      Component Value Range Status Comment   MRSA by PCR NEGATIVE  NEGATIVE Final     Coagulation Studies: No results found for this basename: LABPROT:5,INR:5 in the last 72 hours  Urinalysis:  Basename 04/22/12 1438  COLORURINE YELLOW  LABSPEC 1.014  PHURINE 8.5*  GLUCOSEU NEGATIVE  HGBUR NEGATIVE  BILIRUBINUR NEGATIVE  KETONESUR NEGATIVE  PROTEINUR 100*  UROBILINOGEN 0.2  NITRITE NEGATIVE  LEUKOCYTESUR SMALL*      Imaging: Dg Abd Acute W/chest  04/21/2012  *RADIOLOGY REPORT*  Clinical Data: Abdominal pain, nausea and vomiting  ACUTE ABDOMEN SERIES (ABDOMEN 2 VIEW & CHEST 1 VIEW)  Comparison: 04/01/2012; chest radiograph - 04/04/2012  Findings:  Grossly unchanged enlarged cardiac silhouette and mediastinal contours post median sternotomy and CABG.  Stable positioning of support apparatus.  No focal parenchymal opacities. Pulmonary venous congestion without frank evidence of edema.  No definite pleural effusion or pneumothorax.  Nonobstructive bowel gas pattern.  No pneumoperitoneum, pneumatosis or portal venous gas.  An enteric staple line overlies the left mid hemiabdomen.  Post cholecystectomy.  No acute osseous abnormalities.  IMPRESSION: 1.  Nonobstructive bowel gas pattern.  2.  Stable enlargement of the  cardiac silhouette and mediastinal contours without definite evidence of pulmonary edema.   Original Report Authenticated By: Tacey Ruiz, MD      Medications:        . aspirin EC  81 mg Oral Daily  . carvedilol  3.125 mg Oral BID WC  . cefTRIAXone (ROCEPHIN)  IV  2 g Intravenous Q24H  . docusate sodium  100 mg Oral BID  . heparin  5,000 Units Subcutaneous Q8H  . HYDROmorphone  2 mg Oral TID  . [EXPIRED] iohexol  20 mL Oral Q1 Hr x 2  . methadone  5 mg Oral TID  . metroNIDAZOLE  250 mg Oral Q12H  . multivitamin  1 tablet Oral QHS  . pantoprazole  40 mg Oral Daily  . ranolazine  500 mg Oral Daily  . sevelamer  800 mg Oral TID WC  . simvastatin  20 mg Oral  q1800  . sodium chloride  3 mL Intravenous Q12H  . sodium chloride  3 mL Intravenous Q12H  . [DISCONTINUED] multivitamin  1 tablet Oral Daily   sodium chloride, HYDROmorphone (DILAUDID) injection, ondansetron, sodium chloride  Assessment/ Plan:  61 yo BF with ESRD ? Sec DM/HTN developed n/v/d on tues and felt too weak to go to HD  Similar admission last month. In ER K 6.4 and repeat 6.6. Normally dialyzes on mwf for 4 hrs thru rt permcath. Had Lt AVG placed 2 wks ago and arm has been swollen since.   ESRD MWF Off schedule at present will plan dialysis today  HTN Stable   Anemia Will need ESA and will evaluate outpatient records Hb 7.8   Bones Calcium improved   Hyperkalemia better with dilaysis    LOS: 2 Aroldo Galli W @TODAY @10 :17 AM

## 2012-04-23 NOTE — Progress Notes (Signed)
Pt continues to  refuse  Ct scan.  "Im not having that test done again  Here, I told that doctor and nurse  Earlier  Today".

## 2012-04-23 NOTE — Progress Notes (Signed)
INITIAL ADULT NUTRITION ASSESSMENT Date: 04/23/2012   Time: 1:40 PM  INTERVENTION:  Nepro supplement 2 times daily between meals ---> 425 kcals, 19.1 gm protein per 8 fl oz bottle RD to follow for nutrition care plan  DOCUMENTATION CODES Per approved criteria  -Non-severe (moderate) malnutrition in the context of chronic illness   Reason for Assessment: Malnutrition Screening Tool Report  ASSESSMENT: [name]  Dx: missed dialysis with elevated K+, nausea & vomiting  Hx:  Past Medical History  Diagnosis Date  . Gallstone pancreatitis   . Coronary artery disease     s/p CABG 2008 with multiple PCIs  . Ischemic cardiomyopathy   . End stage renal disease   . Diabetes mellitus   . Hypertension   . History of nonadherence to medical treatment   . LBBB (left bundle branch block)   . Hyperlipidemia   . Renal insufficiency   . CHF (congestive heart failure)   . Dialysis patient   . Anemia    Related Meds:     . aspirin EC  81 mg Oral Daily  . carvedilol  3.125 mg Oral BID WC  . cefTRIAXone (ROCEPHIN)  IV  2 g Intravenous Q24H  . Chlorhexidine Gluconate Cloth  6 each Topical Q0600  . [COMPLETED] darbepoetin (ARANESP) injection - DIALYSIS  100 mcg Intravenous Once in dialysis  . docusate sodium  100 mg Oral BID  . heparin  5,000 Units Subcutaneous Q8H  . HYDROmorphone  2 mg Oral TID  . [EXPIRED] iohexol  20 mL Oral Q1 Hr x 2  . methadone  5 mg Oral TID  . metroNIDAZOLE  250 mg Oral Q12H  . multivitamin  1 tablet Oral QHS  . mupirocin ointment  1 application Nasal BID  . pantoprazole  40 mg Oral Daily  . ranolazine  500 mg Oral Daily  . sevelamer  800 mg Oral TID WC  . simvastatin  20 mg Oral q1800  . sodium chloride  3 mL Intravenous Q12H  . sodium chloride  3 mL Intravenous Q12H    Ht: 5\' 4"  (162.6 cm)  Wt: 124 lb 12.5 oz (56.6 kg) (bed scale)  Wt Readings from Last 10 Encounters:  04/23/12 124 lb 12.5 oz (56.6 kg)  04/04/12 117 lb 15.1 oz (53.5 kg)    03/05/12 115 lb 8.3 oz (52.4 kg)  02/24/12 130 lb 4.7 oz (59.1 kg)  10/13/11 135 lb 6.4 oz (61.417 kg)  09/17/11 132 lb 7.9 oz (60.1 kg)  09/17/11 132 lb 7.9 oz (60.1 kg)  06/04/11 157 lb 13.6 oz (71.6 kg)  04/08/11 159 lb 6.4 oz (72.303 kg)  12/01/10 176 lb (79.833 kg)    Ideal Wt: 54.5 kg % Ideal Wt: 104%  Usual Wt: 123 lb -- per RD assessment 04/02/12 % Usual Wt: 99%  Body mass index is 21.42 kg/(m^2).  Food/Nutrition Related Hx: recent weight lost without trying (patient unsure) per admission nutrition screen  Labs:  CMP     Component Value Date/Time   NA 127* 04/23/2012 1035   K 4.2 04/23/2012 1035   CL 90* 04/23/2012 1035   CO2 26 04/23/2012 1035   GLUCOSE 216* 04/23/2012 1035   BUN 39* 04/23/2012 1035   CREATININE 5.16* 04/23/2012 1035   CALCIUM 10.1 04/23/2012 1035   PROT 7.9 04/21/2012 1416   ALBUMIN 2.1* 04/23/2012 1035   AST 13 04/21/2012 1416   ALT <5 04/21/2012 1416   ALKPHOS 100 04/21/2012 1416   BILITOT 0.4 04/21/2012 1416  GFRNONAA 8* 04/23/2012 1035   GFRAA 9* 04/23/2012 1035    Phosphorus  Date Value Range Status  04/23/2012 4.5  2.3 - 4.6 mg/dL Final     Intake/Output Summary (Last 24 hours) at 04/23/12 1341 Last data filed at 04/23/12 0900  Gross per 24 hour  Intake    120 ml  Output      0 ml  Net    120 ml    Diet Order: Renal 60/70-07-17-1198 ml  Supplements/Tube Feeding: RENA-VIT daily  IVF: N/A  Estimated Nutritional Needs:   Kcal: 1600-1800 Protein: 75-85 gm Fluid: 1.2 L  Patient admitted after developing N/V/D and felt too weak to go to HD; had Lt AVG placed 2 weeks ago and arm has been swollen. RD spoke with patient during HD session; reports her appetite has been good today, however has been suboptimal for several months.  Patient with visible muscle wasting to temples, shoulders and clavicles; patient stated she had a Nepro supplement this AM, however, currently not an active order -- RD to order. Noted patient with Stage I pressure  ulcer to coccyx.  Patient meets criteria for non-severe (moderate) malnutrition in the context of chronic illness given < 75% intake of estimated energy needs for >/= 1 month and mild muscle loss.  NUTRITION DIAGNOSIS: -Increased nutrient needs (NI-5.1).  Status: Ongoing  RELATED TO: ESRD on hemodialysis, wound healing  AS EVIDENCE BY: estimated nutrition needs  MONITORING/EVALUATION(Goals): Goal: Oral intake with meals & supplements to meet >/= 90% of estimated nutrition needs Monitor: PO & supplemental intake, weight, labs, I/O's  EDUCATION NEEDS: -No education needs identified at this time  Kirkland Hun, RD, LDN Pager #: [phone] After-Hours Pager #: [phone]

## 2012-04-23 NOTE — Progress Notes (Signed)
Subjective:   [name] is feels better, still has mild nausea without vomiting. Her abdominal pain improved. Did not have bowel movements for 2 days. Her left arm pain is better. Spiked fever (101.5) on 11/8, no fever today.   Objective: Vital signs in last 24 hours: Filed Vitals:   04/22/12 1000 04/22/12 1353 04/22/12 2034 04/23/12 0434  BP: 121/71 121/57 127/73 120/83  Pulse: 65 86 74 81  Temp:  101.5 F (38.6 C) 99.2 F (37.3 C) 99.6 F (37.6 C)  TempSrc:  Oral Oral Oral  Resp: 20 18 18 18   Height:      Weight:      SpO2: 100% 96% 98% 96%   Weight change:   Intake/Output Summary (Last 24 hours) at 04/23/12 0817 Last data filed at 04/22/12 1300  Gross per 24 hour  Intake    120 ml  Output      0 ml  Net    120 ml    Physical Exam Blood pressure 120/83, pulse 81, temperature 99.6 F (37.6 C), temperature source Oral, resp. rate 18, height 5\' 4"  (1.626 m), weight 120 lb 5.9 oz (54.6 kg), SpO2 96.00%.  General: No acute distress, alert and oriented x 3, well-appearing  HEENT: PERRL, EOMI, moist mucous membranes  Cardiovascular: Regular rate and rhythm, 3/6 SEM  Respiratory: Clear to auscultation bilaterally, no wheezes, rales, or rhonchi  Chest: HD cath noted on R side of chest, well healed sternotomy scar noted, pacer over L chest, nontender  Abdomen: Soft, PD scar noted, mild tender to palpation mostly in RU and RLQ, + bowel sounds, No guarding or rebound pain. LUE: s/p AVG placement 2 weeks ago. Surgical site with steri strips in place, healing incision, no pus or blood draining, large surrounding area of induration, no erythema or warmth, 2+ pitting edema throughout entire UE, slightly improved from yestserday, hard to appreciate pulses 2/2 swelling, extremity is warm, appears well perfused  RUE: no edema, good pulses  Lower extremities: 1+ pitting edema b/l, good DP pulse Skin: Warm, dry, no rashes  Neuro: Not anxious appearing, no depressed mood, normal  affect  MSK: strength is 5/5 upper and lower extremities, no focal deficits   Lab Results: CBC    Component Value Date/Time   WBC 9.9 04/22/2012 0535   WBC 6.7 04/08/2011 1054   RBC 2.92* 04/22/2012 0535   RBC 3.40* 04/08/2011 1054   HGB 7.8* 04/22/2012 0535   HGB 10.1* 04/08/2011 1054   HCT 25.6* 04/22/2012 0535   HCT 30.1* 04/08/2011 1054   PLT 140* 04/22/2012 0535   PLT 232 04/08/2011 1054   MCV 87.7 04/22/2012 0535   MCV 88.6 04/08/2011 1054   MCH 26.7 04/22/2012 0535   MCH 29.7 04/08/2011 1054   MCHC 30.5 04/22/2012 0535   MCHC 33.5 04/08/2011 1054   RDW 18.2* 04/22/2012 0535   RDW 20.4* 04/08/2011 1054   LYMPHSABS 1.2 04/21/2012 1416   LYMPHSABS 1.4 04/08/2011 1054   MONOABS 0.8 04/21/2012 1416   MONOABS 0.4 04/08/2011 1054   EOSABS 0.2 04/21/2012 1416   EOSABS 0.2 04/08/2011 1054   BASOSABS 0.1 04/21/2012 1416   BASOSABS 0.0 04/08/2011 1054    BMET    Component Value Date/Time   NA 133* 04/22/2012 0535   K 3.9 04/22/2012 0535   CL 95* 04/22/2012 0535   CO2 27 04/22/2012 0535   GLUCOSE 76 04/22/2012 0535   BUN 29* 04/22/2012 0535   CREATININE 4.15* 04/22/2012 0535   CALCIUM  8.8 04/22/2012 0535   GFRNONAA 11* 04/22/2012 0535   GFRAA 12* 04/22/2012 0535     Micro Results: No results found for this or any previous visit (from the past 240 hour(s)).  Studies/Results: Dg Abd Acute W/chest  04/21/2012  *RADIOLOGY REPORT*  Clinical Data: Abdominal pain, nausea and vomiting  ACUTE ABDOMEN SERIES (ABDOMEN 2 VIEW & CHEST 1 VIEW)  Comparison: 04/01/2012; chest radiograph - 04/04/2012  Findings:  Grossly unchanged enlarged cardiac silhouette and mediastinal contours post median sternotomy and CABG.  Stable positioning of support apparatus.  No focal parenchymal opacities. Pulmonary venous congestion without frank evidence of edema.  No definite pleural effusion or pneumothorax.  Nonobstructive bowel gas pattern.  No pneumoperitoneum, pneumatosis or portal venous gas.  An enteric staple  line overlies the left mid hemiabdomen.  Post cholecystectomy.  No acute osseous abnormalities.  IMPRESSION: 1.  Nonobstructive bowel gas pattern.  2.  Stable enlargement of the cardiac silhouette and mediastinal contours without definite evidence of pulmonary edema.   Original Report Authenticated By: Tacey Ruiz, MD     Medications: medications reviewed Scheduled Meds:    . aspirin EC  81 mg Oral Daily  . carvedilol  3.125 mg Oral BID WC  . cefTRIAXone (ROCEPHIN)  IV  2 g Intravenous Q24H  . heparin  5,000 Units Subcutaneous Q8H  . HYDROmorphone  2 mg Oral TID  . [EXPIRED] iohexol  20 mL Oral Q1 Hr x 2  . methadone  5 mg Oral TID  . metroNIDAZOLE  250 mg Oral Q12H  . multivitamin  1 tablet Oral QHS  . pantoprazole  40 mg Oral Daily  . ranolazine  500 mg Oral Daily  . sevelamer  800 mg Oral TID WC  . simvastatin  20 mg Oral q1800  . sodium chloride  3 mL Intravenous Q12H  . sodium chloride  3 mL Intravenous Q12H  . [DISCONTINUED] multivitamin  1 tablet Oral Daily   Continuous Infusions:  PRN Meds:.sodium chloride, HYDROmorphone (DILAUDID) injection, ondansetron, sodium chloride  Assessment/Plan:  [name] is a 61 yo lady with a history of ESRD on HD MWF schedule, CHF (last EF 20-25%), DM2, who presents with nausea, vomiting, diarrhea, and LUE swelling after missing dialysis yesterday.   #: Abdominal Pain: improving. Concern mostly for SBP vs UTI/Pyelo vs divertinulitis. UA positive for UTI. Other differential diagnoses include: viral gastroenteritis, uremia 2/2 missed dialysis, concern for spontaneous bacterial peritonitis given reported hx of "fatty liver" and frequent abd paracentesis + abdominal tenderness, pancreatitis (unlikely given normal lipase) and cholecystitis (unlikely likely given s/p of cholecystectomy per patient and normal LFTs). Patient resufled CT-abd/pelvis yestoday. Flagyl was added for broader coverage on 11/8/. Her condition is improving.   Plan  -Will  cancel CT abdomen/pelvis -Continue IV ceftriaxone, would cover SBP and UTI -Continue oral Flagyl -IV dilaudid for breakthrough pain -zofran PRN for nausea -Pending BMP and CBC - pending blood cultures and Ux.  #: Hyperkalemia: RESOLVED. Potassium 6.6 on admission, without significant EKG changes. Most likely 2/2 missed hemodialysis. Renal following, HD done on the evening of 11/7. K was 3.9 this on 11/8.  -Pending BMP  #: Chronic kidney disease on hemodialysis (MWF): Renal was consulted, Dr. Briant Cedar evaluated the patient, did  hemodialysis on 11/7  -Appreciated renal's consultation. Possible HD today. -Will continue Sevelamer and renal diet.  -Will need to get back on MWF schedule as outpatient  # Possible spontaneous bacterial peritonitis, in setting of "Fatty Liver" with Ascites  Pt states  she is s/p esophageal variceal banding procedure, though we do not have records of that. Last EGD with abnormal mass and gastritis, but no varices noted. She states she is followed at [facility] where she undergoes intermittent abdominal paracentesis usually every 2 weeks, though for the past 2 months, she has had no fluid on ultrasound so they have not tapped her recently. She does complain of increased abdominal pain and feels like the fluid is building up. LFTs WNL. She spiked fever on 11/9, no fever today. Her leukocytosis resolved on 11/8. Her nausea and abdominal pain are improving.  -cont ceftriaxone 2g for SBP and oral flagyl  #: HTN: Blood pressure is well controlled. Today blood pressure is 120/83. Will continue her home Coreg 3.125 mg daily, which is also for her CHF.   -cont to monitor  #: CHF: stable. Hx of EF 20-25% on ECHo from 10/07/11. Chest x-ray without any acute pulmonary edema. On auscultation, lungs are clear bilaterally. Will continue home regimen (Coreg 3.125 mg daily) and ASA.   #: Left arm swelling: left AVG was placed 2 wks ago and arm has been swollen since. Differential  diagnoses include venous thromboses, venous stenoses and less likely infection (giving there is no significant redness or tenderness locally). Consulted vascular surgeon. Dr. Johny Drilling evaluated patient and thought to be consistent with normal post op changes after AVG placement.  -Pending [place] of left arm to evaluate for thrombosis  -encourage elevation of that arm  #: DVT PPX: heparin   #Diet  -renal    LOS: 2 days   Lorretta Harp 04/23/2012, 8:17 AM

## 2012-04-23 NOTE — Progress Notes (Addendum)
Internal Medicine On Call Attending  Date: 04/23/2012  Patient name: [name] Medical record number: [id number] Date of birth: 05-Nov-1950 Age: 61 y.o. Gender: female  I saw and evaluated the patient. I reviewed the resident's note by Dr. Clyde Lundborg and I agree with the resident's findings and plans as documented in his note.

## 2012-04-24 LAB — BASIC METABOLIC PANEL
BUN: 17 mg/dL (ref 6–23)
CO2: 28 mEq/L (ref 19–32)
Calcium: 10.3 mg/dL (ref 8.4–10.5)
Creatinine, Ser: 2.82 mg/dL — ABNORMAL HIGH (ref 0.50–1.10)
GFR calc Af Amer: 20 mL/min — ABNORMAL LOW (ref >90)

## 2012-04-24 LAB — CBC
MCH: 27.1 pg (ref 26.0–34.0)
MCV: 88.8 fL (ref 78.0–100.0)
Platelets: 131 10*3/uL — ABNORMAL LOW (ref 150–400)
RDW: 18.4 % — ABNORMAL HIGH (ref 11.5–15.5)

## 2012-04-24 MED ORDER — ASPIRIN 81 MG PO TBEC: 81.0000 mg | DELAYED_RELEASE_TABLET | Freq: Every day | ORAL | Status: DC

## 2012-04-24 MED ORDER — POTASSIUM CHLORIDE CRYS ER 20 MEQ PO TBCR
40.0000 meq | EXTENDED_RELEASE_TABLET | Freq: Once | ORAL | Status: AC
  Administered 2012-04-24: 40 meq via ORAL

## 2012-04-24 MED ORDER — CIPROFLOXACIN HCL 100 MG PO TABS: 300.0000 mg | ORAL_TABLET | Freq: Every day | ORAL | Status: DC

## 2012-04-24 MED ORDER — METRONIDAZOLE 250 MG PO TABS: 250.0000 mg | ORAL_TABLET | Freq: Two times a day (BID) | ORAL | Status: DC

## 2012-04-24 MED ORDER — POTASSIUM CHLORIDE CRYS ER 20 MEQ PO TBCR
EXTENDED_RELEASE_TABLET | ORAL | Status: AC
  Filled 2012-04-24: qty 2

## 2012-04-24 NOTE — Progress Notes (Signed)
[facility] ROUNDING NOTE   Subjective:   Interval History: none  Objective:  Vital signs in last 24 hours:  Temp:  [97.7 F (36.5 C)-99.7 F (37.6 C)] 97.7 F (36.5 C) (11/10 0936) Pulse Rate:  [62-84] 78  (11/10 0936) Resp:  [14-20] 18  (11/10 0936) BP: (104-135)/(50-79) 135/70 mmHg (11/10 0936) SpO2:  [97 %-100 %] 100 % (11/10 0936) Weight:  [54.3 kg (119 lb 11.4 oz)-54.4 kg (119 lb 14.9 oz)] 54.4 kg (119 lb 14.9 oz) (11/09 2010)  Weight change:  Filed Weights   04/23/12 1025 04/23/12 1435 04/23/12 2010  Weight: 56.6 kg (124 lb 12.5 oz) 54.3 kg (119 lb 11.4 oz) 54.4 kg (119 lb 14.9 oz)    Intake/Output: I/O last 3 completed shifts: In: 463 [P.O.:360; I.V.:3; IV Piggyback:100] Out: 3050 [Urine:50; Other:3000]   Intake/Output this shift:  Total I/O In: 300 [P.O.:300] Out: -   NECK:Rt IJ PC. No JVD  Chest Lt Pacermaker  LUNGS:decreased BS throughout  CARDIAC:RRR wo MRG  ABD:+BS Mild diffuse tenderness, no rebound  EXT:1+ edema Lt forearm AVG + bruit with Lt arm swelling  NEURO:CNI Ox3 no asterixis    Basic Metabolic Panel:  Lab 04/24/12 4098 04/23/12 1035 04/22/12 0535 04/21/12 1758 04/21/12 1416  NA 130* 127* 133* 131* 129*  K 3.4* 4.2 3.9 6.6* 6.4*  CL 94* 90* 95* 97 92*  CO2 28 26 27 20 22   GLUCOSE 154* 216* 76 92 101*  BUN 17 39* 29* 87* 83*  CREATININE 2.82* 5.16* 4.15* 8.68* 8.46*  CALCIUM 10.3 10.1 8.8 -- --  MG -- -- -- -- --  PHOS -- 4.5 3.4 -- --    Liver Function Tests:  Lab 04/23/12 1035 04/22/12 0535 04/21/12 1416  AST -- -- 13  ALT -- -- <5  ALKPHOS -- -- 100  BILITOT -- -- 0.4  PROT -- -- 7.9  ALBUMIN 2.1* 2.0* 2.6*    Lab 04/21/12 1416  LIPASE 16  AMYLASE --   No results found for this basename: AMMONIA:3 in the last 168 hours  CBC:  Lab 04/24/12 0540 04/23/12 1035 04/22/12 0535 04/21/12 1416  WBC 10.3 12.8* 9.9 13.1*  NEUTROABS -- -- -- 11.0*  HGB 8.0* 7.8* 7.8* 9.9*  HCT 26.2* 25.9* 25.6* 32.3*  MCV  88.8 88.1 87.7 87.5  PLT 131* 128* 140* 199    Cardiac Enzymes:  Lab 04/21/12 1419  CKTOTAL --  CKMB --  CKMBINDEX --  TROPONINI <0.30    BNP: No components found with this basename: POCBNP:5  CBG: No results found for this basename: GLUCAP:5 in the last 168 hours  Microbiology: Results for orders placed during the hospital encounter of 04/21/12  CULTURE, BLOOD (ROUTINE X 2)     Status: Normal (Preliminary result)   Collection Time   04/21/12  8:00 PM      Component Value Range Status Comment   Specimen Description BLOOD HEMODIALYSIS CATHETER   Final    Special Requests BOTTLES DRAWN AEROBIC AND ANAEROBIC 10CC   Final    Culture  Setup Time 04/22/2012 04:30   Final    Culture     Final    Value:        BLOOD CULTURE RECEIVED NO GROWTH TO DATE CULTURE WILL BE HELD FOR 5 DAYS BEFORE ISSUING A FINAL NEGATIVE REPORT   Report Status PENDING   Incomplete   CULTURE, BLOOD (ROUTINE X 2)     Status: Normal (Preliminary result)   Collection Time  04/21/12  8:02 PM      Component Value Range Status Comment   Specimen Description BLOOD HEMODIALYSIS CATHETER   Final    Special Requests BOTTLES DRAWN AEROBIC AND ANAEROBIC 10CC   Final    Culture  Setup Time 04/22/2012 04:29   Final    Culture     Final    Value:        BLOOD CULTURE RECEIVED NO GROWTH TO DATE CULTURE WILL BE HELD FOR 5 DAYS BEFORE ISSUING A FINAL NEGATIVE REPORT   Report Status PENDING   Incomplete   URINE CULTURE     Status: Normal   Collection Time   04/22/12  2:38 PM      Component Value Range Status Comment   Specimen Description URINE, CLEAN CATCH   Final    Special Requests NONE   Final    Culture  Setup Time 04/22/2012 15:10   Final    Colony Count 25,000 COLONIES/ML   Final    Culture     Final    Value: Multiple bacterial morphotypes present, none predominant. Suggest appropriate recollection if clinically indicated.   Report Status 04/23/2012 FINAL   Final     Coagulation Studies: No results found for  this basename: LABPROT:5,INR:5 in the last 72 hours  Urinalysis:  Basename 04/22/12 1438  COLORURINE YELLOW  LABSPEC 1.014  PHURINE 8.5*  GLUCOSEU NEGATIVE  HGBUR NEGATIVE  BILIRUBINUR NEGATIVE  KETONESUR NEGATIVE  PROTEINUR 100*  UROBILINOGEN 0.2  NITRITE NEGATIVE  LEUKOCYTESUR SMALL*      Imaging: No results found.   Medications:        . aspirin EC  81 mg Oral Daily  . carvedilol  3.125 mg Oral BID WC  . cefTRIAXone (ROCEPHIN)  IV  2 g Intravenous Q24H  . Chlorhexidine Gluconate Cloth  6 each Topical Q0600  . [COMPLETED] darbepoetin (ARANESP) injection - DIALYSIS  100 mcg Intravenous Once in dialysis  . docusate sodium  100 mg Oral BID  . feeding supplement (NEPRO CARB STEADY)  237 mL Oral BID BM  . heparin  5,000 Units Subcutaneous Q8H  . HYDROmorphone  2 mg Oral TID  . methadone  5 mg Oral TID  . metroNIDAZOLE  250 mg Oral Q12H  . multivitamin  1 tablet Oral QHS  . mupirocin ointment  1 application Nasal BID  . pantoprazole  40 mg Oral Daily  . potassium chloride SA      . potassium chloride  40 mEq Oral Once  . ranolazine  500 mg Oral Daily  . sevelamer  800 mg Oral TID WC  . simvastatin  20 mg Oral q1800  . sodium chloride  3 mL Intravenous Q12H  . sodium chloride  3 mL Intravenous Q12H  . [DISCONTINUED] feeding supplement (NEPRO CARB STEADY)  237 mL Oral TID BM   sodium chloride, HYDROmorphone (DILAUDID) injection, ondansetron, sodium chloride  Assessment/ Plan:  61 yo BF with ESRD ? Sec DM/HTN developed n/v/d on tues and felt too weak to go to HD Similar admission last month. In ER K 6.4 and repeat 6.6. Normally dialyzes on mwf for 4 hrs thru rt permcath. Had Lt AVG placed 2 wks ago and arm has been swollen since.   ESRD MWF   HTN Stable   Anemia Darbepoietin given 11/9  Bones Calcium improved   Hyperkalemia better  Patient is to follow with Dr Hollace Hayward Duke for AVF swelling   LOS: 3 [name] W @TODAY @10 :41 AM

## 2012-04-24 NOTE — Progress Notes (Signed)
Subjective:   [name] is feels normal today. No nausea, vomiting or abdominal pain. Her left arm pain is minimal today. No fever today.   Objective: Vital signs in last 24 hours: Filed Vitals:   04/23/12 1826 04/23/12 2010 04/24/12 0547 04/24/12 0936  BP: 111/71 116/56 125/79 135/70  Pulse: 64 79 84 78  Temp: 99.7 F (37.6 C) 98.6 F (37 C) 99.2 F (37.3 C) 97.7 F (36.5 C)  TempSrc: Oral Oral Oral Oral  Resp: 18 18 18 18   Height:  5\' 4"  (1.626 m)    Weight:  119 lb 14.9 oz (54.4 kg)    SpO2: 100% 99% 99% 100%   Weight change:   Intake/Output Summary (Last 24 hours) at 04/24/12 1103 Last data filed at 04/24/12 0900  Gross per 24 hour  Intake    643 ml  Output   3050 ml  Net  -2407 ml    Physical Exam Blood pressure 135/70, pulse 78, temperature 97.7 F (36.5 C), temperature source Oral, resp. rate 18, height 5\' 4"  (1.626 m), weight 119 lb 14.9 oz (54.4 kg), SpO2 100.00%.  General: No acute distress, alert and oriented x 3, well-appearing  HEENT: PERRL, EOMI, moist mucous membranes  Cardiovascular: Regular rate and rhythm, 3/6 SEM  Respiratory: Clear to auscultation bilaterally, no wheezes, rales, or rhonchi  Chest: HD cath noted on R side of chest, well healed sternotomy scar noted, pacer over L chest, nontender  Abdomen: Soft, PD scar noted, minimal tender to deep palpation mostly in RU and RLQ, + bowel sounds, No guarding or rebound pain. LUE: s/p AVG placement 2 weeks ago. Surgical site with steri strips in place, healing incision, no pus or blood draining, large surrounding area of induration, no erythema or warmth, 2+ pitting edema throughout entire UE, slightly improved from yestserday, hard to appreciate pulses 2/2 swelling, extremity is warm, appears well perfused  RUE: no edema, good pulses  Lower extremities: 1+ pitting edema b/l, good DP pulse Skin: Warm, dry, no rashes  Neuro: Not anxious appearing, no depressed mood, normal affect  MSK: strength is  5/5 upper and lower extremities, no focal deficits   Lab Results: CBC    Component Value Date/Time   WBC 10.3 04/24/2012 0540   WBC 6.7 04/08/2011 1054   RBC 2.95* 04/24/2012 0540   RBC 3.40* 04/08/2011 1054   HGB 8.0* 04/24/2012 0540   HGB 10.1* 04/08/2011 1054   HCT 26.2* 04/24/2012 0540   HCT 30.1* 04/08/2011 1054   PLT 131* 04/24/2012 0540   PLT 232 04/08/2011 1054   MCV 88.8 04/24/2012 0540   MCV 88.6 04/08/2011 1054   MCH 27.1 04/24/2012 0540   MCH 29.7 04/08/2011 1054   MCHC 30.5 04/24/2012 0540   MCHC 33.5 04/08/2011 1054   RDW 18.4* 04/24/2012 0540   RDW 20.4* 04/08/2011 1054   LYMPHSABS 1.2 04/21/2012 1416   LYMPHSABS 1.4 04/08/2011 1054   MONOABS 0.8 04/21/2012 1416   MONOABS 0.4 04/08/2011 1054   EOSABS 0.2 04/21/2012 1416   EOSABS 0.2 04/08/2011 1054   BASOSABS 0.1 04/21/2012 1416   BASOSABS 0.0 04/08/2011 1054    BMET    Component Value Date/Time   NA 130* 04/24/2012 0540   K 3.4* 04/24/2012 0540   CL 94* 04/24/2012 0540   CO2 28 04/24/2012 0540   GLUCOSE 154* 04/24/2012 0540   BUN 17 04/24/2012 0540   CREATININE 2.82* 04/24/2012 0540   CALCIUM 10.3 04/24/2012 0540   GFRNONAA 17* 04/24/2012  0540   GFRAA 20* 04/24/2012 0540     Micro Results: Recent Results (from the past 240 hour(s))  CULTURE, BLOOD (ROUTINE X 2)     Status: Normal (Preliminary result)   Collection Time   04/21/12  8:00 PM      Component Value Range Status Comment   Specimen Description BLOOD HEMODIALYSIS CATHETER   Final    Special Requests BOTTLES DRAWN AEROBIC AND ANAEROBIC 10CC   Final    Culture  Setup Time 04/22/2012 04:30   Final    Culture     Final    Value:        BLOOD CULTURE RECEIVED NO GROWTH TO DATE CULTURE WILL BE HELD FOR 5 DAYS BEFORE ISSUING A FINAL NEGATIVE REPORT   Report Status PENDING   Incomplete   CULTURE, BLOOD (ROUTINE X 2)     Status: Normal (Preliminary result)   Collection Time   04/21/12  8:02 PM      Component Value Range Status Comment    Specimen Description BLOOD HEMODIALYSIS CATHETER   Final    Special Requests BOTTLES DRAWN AEROBIC AND ANAEROBIC 10CC   Final    Culture  Setup Time 04/22/2012 04:29   Final    Culture     Final    Value:        BLOOD CULTURE RECEIVED NO GROWTH TO DATE CULTURE WILL BE HELD FOR 5 DAYS BEFORE ISSUING A FINAL NEGATIVE REPORT   Report Status PENDING   Incomplete   URINE CULTURE     Status: Normal   Collection Time   04/22/12  2:38 PM      Component Value Range Status Comment   Specimen Description URINE, CLEAN CATCH   Final    Special Requests NONE   Final    Culture  Setup Time 04/22/2012 15:10   Final    Colony Count 25,000 COLONIES/ML   Final    Culture     Final    Value: Multiple bacterial morphotypes present, none predominant. Suggest appropriate recollection if clinically indicated.   Report Status 04/23/2012 FINAL   Final     Studies/Results: No results found.  Medications: medications reviewed Scheduled Meds:    . aspirin EC  81 mg Oral Daily  . carvedilol  3.125 mg Oral BID WC  . cefTRIAXone (ROCEPHIN)  IV  2 g Intravenous Q24H  . Chlorhexidine Gluconate Cloth  6 each Topical Q0600  . [COMPLETED] darbepoetin (ARANESP) injection - DIALYSIS  100 mcg Intravenous Once in dialysis  . docusate sodium  100 mg Oral BID  . feeding supplement (NEPRO CARB STEADY)  237 mL Oral BID BM  . heparin  5,000 Units Subcutaneous Q8H  . HYDROmorphone  2 mg Oral TID  . methadone  5 mg Oral TID  . metroNIDAZOLE  250 mg Oral Q12H  . multivitamin  1 tablet Oral QHS  . mupirocin ointment  1 application Nasal BID  . pantoprazole  40 mg Oral Daily  . [COMPLETED] potassium chloride  40 mEq Oral Once  . ranolazine  500 mg Oral Daily  . sevelamer  800 mg Oral TID WC  . simvastatin  20 mg Oral q1800  . sodium chloride  3 mL Intravenous Q12H  . sodium chloride  3 mL Intravenous Q12H  . [DISCONTINUED] feeding supplement (NEPRO CARB STEADY)  237 mL Oral TID BM   Continuous Infusions:  PRN  Meds:.sodium chloride, HYDROmorphone (DILAUDID) injection, ondansetron, sodium chloride  Assessment/Plan:  Ms. [name] is  a 61 yo lady with a history of ESRD on HD MWF schedule, CHF (last EF 20-25%), DM2, who presents with nausea, vomiting, diarrhea, and LUE swelling after missing dialysis yesterday.   #: Abdominal Pain: resolved. Concerned mostly for SBP vs UTI/Pyelo vs divertinulitis. UA positive for UTI. Other differential diagnoses include: viral gastroenteritis, uremia 2/2 missed dialysis, concern for spontaneous bacterial peritonitis given reported hx of "fatty liver" and frequent abd paracentesis + abdominal tenderness, pancreatitis (unlikely given normal lipase) and cholecystitis (unlikely likely given s/p of cholecystectomy per patient and normal LFTs). Patient refused CT-abd/pelvis. Flagyl was added for broader coverage on 11/8/. Her symptoms resolved.   Plan  -will d/c home on oral ciprofloxacin and flagyl for 7 days. -will let patient be followed up in our clinic within one week.  #: Hyperkalemia: RESOLVED. Today K is low at 3.4. -will give 40 mEq of KCl before going home.    #: Chronic kidney disease on hemodialysis (MWF): Renal was consulted, Dr. Briant Cedar evaluated the patient, received hemodialysis on 11/7 and 11/9. I spoke with renal PA physician, Eligah East about discharge plan for today. She will inform patient's HD center for resuming her HD after being discharged.  # Possible spontaneous bacterial peritonitis, in setting of "Fatty Liver" with Ascites  Pt states she is s/p esophageal variceal banding procedure, though we do not have records of that. Last EGD with abnormal mass and gastritis, but no varices noted. She states she is followed at [facility] where she undergoes intermittent abdominal paracentesis usually every 2 weeks, though for the past 2 months, she has had no fluid on ultrasound so they have not tapped her recently. She does complain of increased abdominal  pain and feels like the fluid is building up. LFTs WNL. She responded to IV ceftriaxone and oral flayl well. Her symptoms resolved. Her leukocytosis resolved on 11/8. No fever today.  -will d/c on oral flagyl and cipro for 7 days.  #: HTN: Blood pressure is well controlled. Today blood pressure is 135/71. Will continue her home Coreg 3.125 mg daily, which is also for her CHF.   #: CHF: stable. Hx of EF 20-25% on ECHo from 10/07/11. Chest x-ray without any acute pulmonary edema. On auscultation, lungs are clear bilaterally. Will continue home regimen (Coreg 3.125 mg daily) and ASA.   #: Left arm swelling: left AVG was placed 2 wks ago and arm has been swollen since. Differential diagnoses include venous thromboses, venous stenoses and less likely infection (giving there is no significant redness or tenderness locally). Consulted vascular surgeon. Dr. Johny Drilling evaluated patient and thought to be consistent with normal post op changes after AVG placement.  -Patient will have a follow up appointment with her vascular surgeon at [facility] on 04/28/12 (it was arranged before)    LOS: 3 days   Lorretta Harp 04/24/2012, 11:03 AM

## 2012-04-24 NOTE — Progress Notes (Signed)
Pt informed me first thing this morning that she is ready to go home, and she will leave whether discharge orders have been put in or not. MD Clyde Lundborg notified, I recommended coming to see her as soon as possible.

## 2012-04-24 NOTE — Discharge Summary (Signed)
Patient Name:  [name]  MRN: [id number]  PCP: [name], MD  DOB:  Jul 28, 1950       Date of Admission:  04/21/2012  Date of Discharge:  04/24/2012      Attending Physician: Dr. Inez Catalina, MD         DISCHARGE DIAGNOSES: 1. Active Problems: 2.  Chronic systolic congestive heart failure, NYHA class 3- EF 20-25% 3.  Hypertension 4.  CKD (chronic kidney disease) stage 5, GFR less than 15 ml/min 5.  Hyperkalemia 6.  Nausea and vomiting 7.  Left arm swelling   DISPOSITION AND FOLLOW-UP: GENAE STRINE is to follow-up with the listed providers as detailed below, at patient's visiting, please address following issues:  For internal medicine clinic follow up, please check her BMP for potassium level, and assure that she dose not have N/V/abdominal pain. Please follow up the final results of blood and urine culture.  Follow-up Information    Follow up with Plonk, Cyd Silence, MD. On 04/28/2012.   Contact information:   Vascular & Endovascular Surgery [facility] [place] [address] [phone]       Please follow up. (you will be called from internal medicine clinic for a hospital follow up appointment in next week.)    Contact information:   [address] [place] [address] [phone]        Discharge Orders    Future Orders Please Complete By Expires   Diet - low sodium heart healthy      Increase activity slowly          DISCHARGE MEDICATIONS:   Medication List     As of 04/24/2012 11:00 AM    TAKE these medications         aspirin 81 MG EC tablet   Take 1 tablet (81 mg total) by mouth daily.      carvedilol 3.125 MG tablet   Commonly known as: COREG   Take 1 tablet (3.125 mg total) by mouth 2 (two) times daily with a meal.      ciprofloxacin 100 MG tablet   Commonly known as: CIPRO   Take 3 tablets (300 mg total) by mouth daily.      darbepoetin 100 MCG/0.5ML Soln   Commonly known as: ARANESP   Inject 0.5 mLs (100 mcg total) into the vein every Monday with hemodialysis.      feeding supplement (NEPRO CARB STEADY) Liqd   Take 237 mLs by mouth 3 (three) times daily between meals.      HYDROmorphone 2 MG tablet   Commonly known as: DILAUDID   Take 1 tablet (2 mg total) by mouth 3 (three) times daily. Takes along with methadone.      methadone 5 MG tablet   Commonly known as: DOLOPHINE   Take 1 tablet (5 mg total) by mouth 3 (three) times daily. Takes along with Dilaudid.      metroNIDAZOLE 250 MG tablet   Commonly known as: FLAGYL   Take 1 tablet (250 mg total) by mouth every 12 (twelve) hours.      multivitamin Tabs tablet   Take 1 tablet by mouth daily.      pantoprazole 40 MG tablet   Commonly known as: PROTONIX   Take 40 mg by mouth daily.      pravastatin 20 MG tablet   Commonly known as: PRAVACHOL   Take 20 mg by mouth at bedtime.  ranolazine 500 MG 12 hr tablet   Commonly known as: RANEXA   Take 500 mg by mouth daily.      RENAGEL 800 MG tablet   Generic drug: sevelamer   Take 800 mg by mouth 3 (three) times daily with meals.         CONSULTS:  Treatment Team:  Dyke Maes, MD    PROCEDURES PERFORMED:  Dg Chest Port 1 View  04/04/2012  *RADIOLOGY REPORT*  Clinical Data: Cough, renal failure, diabetes, CHF  PORTABLE CHEST - 1 VIEW  Comparison: 03/02/2012  Findings: Stable right IJ dialysis catheter tips in the proximal right atrium.  Left subclavian AICD / pacer noted.  Prior coronary bypass evident.  Heart remains enlarged with mild vascular congestion centrally.  No definite focal pneumonia, collapse, consolidation, large effusion or pneumothorax.  Trachea midline. Atherosclerosis of the aorta.  IMPRESSION: Cardiomegaly with vascular congestion.   Original Report Authenticated By: Judie Petit. Ruel Favors, M.D.    Dg Abd Acute W/chest  04/21/2012  *RADIOLOGY REPORT*  Clinical Data: Abdominal pain, nausea and vomiting   ACUTE ABDOMEN SERIES (ABDOMEN 2 VIEW & CHEST 1 VIEW)  Comparison: 04/01/2012; chest radiograph - 04/04/2012  Findings:  Grossly unchanged enlarged cardiac silhouette and mediastinal contours post median sternotomy and CABG.  Stable positioning of support apparatus.  No focal parenchymal opacities. Pulmonary venous congestion without frank evidence of edema.  No definite pleural effusion or pneumothorax.  Nonobstructive bowel gas pattern.  No pneumoperitoneum, pneumatosis or portal venous gas.  An enteric staple line overlies the left mid hemiabdomen.  Post cholecystectomy.  No acute osseous abnormalities.  IMPRESSION: 1.  Nonobstructive bowel gas pattern.  2.  Stable enlargement of the cardiac silhouette and mediastinal contours without definite evidence of pulmonary edema.   Original Report Authenticated By: Tacey Ruiz, MD    Dg Abd Acute W/chest  04/01/2012  *RADIOLOGY REPORT*  Clinical Data: Shortness of breath, cough, chronic pancreatitis  ACUTE ABDOMEN SERIES (ABDOMEN 2 VIEW & CHEST 1 VIEW)  Comparison: CT 02/23/2012, chest radiograph 02/23/2012  Findings: Left AICD in place.  Evidence of CABG.  Moderate enlargement of the cardiomediastinal silhouette is stable.  Central vascular congestion without overt edema.  Trace pleural effusions. No free air beneath the diaphragms.  The most inferior sternal wire is again noted to be discontinuous.  Cholecystectomy clips noted.  Vascular calcifications are noted. Normal visualized bowel gas pattern.  Left upper quadrant anastomotic chain sutures are noted.  No acute osseous abnormality.  IMPRESSION: Normal bowel gas pattern.  Trace bilateral pleural effusions and cardiomegaly.   Original Report Authenticated By: Harrel Lemon, M.D.        ADMISSION DATA:  H&P: [name] is a 61 yo lady with a history of ESRD on HD MWF schedule, CHF (last EF 20-25%), DM2, who presents with nausea, vomiting, diarrhea, and complaints of generalized weakness after missing  dialysis yesterday. She states that she was feeling too sick to go to dialysis yesterday. She has had diarrhea and nausea for the past 4 days, vomiting that started today before arrival to the ED. She endorses decreased appetite, some shortness of breath, swelling in her left arm, and abdominal pain "from her pancreatitis." She also states that her belly hurts and she thinks she is due for a paracentesis, which normally gets done at [facility] interventional radiology for her "fatty liver." Denies hx of alcohol abuse. Former smoker. Denies fever, chills, chest pain. She still makes urine, about 100cc TID.  Physical Exam Blood pressure 146/86, pulse 76, temperature 98.2 F (36.8 C), temperature source Oral, resp. rate 17, SpO2 99.00%. General:  No acute distress, alert and oriented x 3, well-appearing   HEENT:  PERRL, EOMI, moist mucous membranes Cardiovascular:  Regular rate and rhythm, 3/6 SEM Respiratory:  Clear to auscultation bilaterally, no wheezes, rales, or rhonchi Chest: HD cath noted on R side of chest, well healed sternotomy scar noted, pacer over L chest, nontender Abdomen:  Slightly firm, PD scar noted, tender to palpation, mostly in RU and RLQ, + bowel sounds LUE: s/p AVG placement 2 weeks ago. Surgical site with steri strips in place, healing incision, no pus or blood draining, large surrounding area of induration, no erythema or warmth, 2+ pitting edema throughout entire UE, hard to appreciate pulses 2/2 swelling, extremity is warm, appears well perfused RUE: no edema, good pulses Lower extremities: 2+ pitting edema b/l, good DP pulse, slightly cool to touch Skin: Warm, dry, no rashes Neuro: Not anxious appearing, no depressed mood, normal affect MSK: strength is 5/5 upper and lower extremities, no focal deficits  Lab results: Basic Metabolic Panel:  Basename  04/21/12 1758  04/21/12 1416   NA  131*  129*   K  6.6*  6.4*   CL  97  92*   CO2  20  22   GLUCOSE  92  101*   BUN   87*  83*   CREATININE  8.68*  8.46*   CALCIUM  10.7*  11.0*   MG  --  --   PHOS  --  --    Liver Function Tests:  Basename  04/21/12 1416   AST  13   ALT  <5   ALKPHOS  100   BILITOT  0.4   PROT  7.9   ALBUMIN  2.6*     Basename  04/21/12 1416   LIPASE  16   AMYLASE  --    CBC:  Basename  04/21/12 1416   WBC  13.1*   NEUTROABS  11.0*   HGB  9.9*   HCT  32.3*   MCV  87.5   PLT  199    Cardiac Enzymes:  Basename  04/21/12 1419   CKTOTAL  --   CKMB  --   CKMBINDEX  --   TROPONINI  <0.30    BNP:  Basename  04/21/12 1419   PROBNP  >70000.0*    HOSPITAL COURSE:  #: Abdominal Pain: resolved. Etiology was not worked out. Patient refused CT-abd/pelvis. Concerned mostly for SBP vs UTI/Pyelo vs divertinulitis. UA positive for UTI (may not be a clean catch). Other differential diagnoses include: viral gastroenteritis, uremia 2/2 missed dialysis, concern for spontaneous bacterial peritonitis given reported hx of "fatty liver" and frequent abd paracentesis + abdominal tenderness, pancreatitis (unlikely given normal lipase) and cholecystitis (unlikely likely given s/p of cholecystectomy per patient and normal LFTs).  Patient was initially treated with IV ceftriaxone and  Flagyl was added for broader coverage on 11/8/. She responded the treatment well. Her symptoms resolved at discharge. Patient was discharged on oral ciprofloxacin and flagyl for 7 days.  Patient be followed up in our clinic within one week.  #: Hyperkalemia: RESOLVED. Today K is low at 3.4 (04/24/12). K was repleted before discharge.  #: Chronic kidney disease on hemodialysis (MWF): Renal was consulted, Dr. Briant Cedar evaluated the patient, received hemodialysis on 11/7 and 11/9. I spoke with renal PA physician, Eligah East about discharge plan for today. She will inform  patient's HD center for resuming her HD after being discharged.  # Possible spontaneous bacterial peritonitis, in setting of "Fatty Liver"  with Ascites   Pt states she is s/p esophageal variceal banding procedure, though we do not have records of that. Last EGD with abnormal mass and gastritis, but no varices noted. She states she is followed at [facility] where she undergoes intermittent abdominal paracentesis usually every 2 weeks, though for the past 2 months, she has had no fluid on ultrasound so they have not tapped her recently. She does complain of increased abdominal pain and feels like the fluid is building up. LFTs WNL. She responded to IV ceftriaxone and oral flayl well. Her symptoms resolved. Her leukocytosis resolved on 11/8. No fever at discharge. She will be on oral oral flagyl and cipro for 7 days.  #: HTN: Blood pressure is well controlled.  Blood pressure is 135/71 at discharge. Will continue her home Coreg 3.125 mg daily, which is also for her CHF.   #: CHF: stable. Hx of EF 20-25% on ECHo from 10/07/11. Chest x-ray without any acute pulmonary edema. On auscultation, lungs are clear bilaterally. Will continue home regimen (Coreg 3.125 mg daily) and ASA.   #: Left arm swelling: left AVG was placed 2 wks ago and arm has been swollen since. Differential diagnoses include venous thromboses, venous stenoses and less likely infection (giving there is no significant redness or tenderness locally). Consulted vascular surgeon. Dr. Johny Drilling evaluated patient and thought to be consistent with normal post op changes after AVG placement.  -Patient will have a follow up appointment with her vascular surgeon at [facility] on 04/28/12 (it was arranged before)    DISCHARGE DATA: Vital Signs: BP 135/70  Pulse 78  Temp 97.7 F (36.5 C) (Oral)  Resp 18  Ht 5\' 4"  (1.626 m)  Wt 119 lb 14.9 oz (54.4 kg)  BMI 20.59 kg/m2  SpO2 100%  Labs: Results for orders placed during the hospital encounter of 04/21/12 (from the past 24 hour(s))  CBC     Status: Abnormal   Collection Time   04/24/12  5:40 AM      Component Value Range     WBC 10.3  4.0 - 10.5 K/uL   RBC 2.95 (*) 3.87 - 5.11 MIL/uL   Hemoglobin 8.0 (*) 12.0 - 15.0 g/dL   HCT 16.1 (*) 09.6 - 04.5 %   MCV 88.8  78.0 - 100.0 fL   MCH 27.1  26.0 - 34.0 pg   MCHC 30.5  30.0 - 36.0 g/dL   RDW 40.9 (*) 81.1 - 91.4 %   Platelets 131 (*) 150 - 400 K/uL  BASIC METABOLIC PANEL     Status: Abnormal   Collection Time   04/24/12  5:40 AM      Component Value Range   Sodium 130 (*) 135 - 145 mEq/L   Potassium 3.4 (*) 3.5 - 5.1 mEq/L   Chloride 94 (*) 96 - 112 mEq/L   CO2 28  19 - 32 mEq/L   Glucose, Bld 154 (*) 70 - 99 mg/dL   BUN 17  6 - 23 mg/dL   Creatinine, Ser 7.82 (*) 0.50 - 1.10 mg/dL   Calcium 95.6  8.4 - 21.3 mg/dL   GFR calc non Af Amer 17 (*) >90 mL/min   GFR calc Af Amer 20 (*) >90 mL/min     Time Spent on Discharge: 35 min   Signed: Lorretta Harp, MD PGY I, Internal Medicine  Resident 04/24/2012, 11:00 AM

## 2012-04-24 NOTE — Progress Notes (Signed)
Discharge instructions discussed with pt by Synetta Fail, RN. Pt showed no barriers to discharge. IV removed. Tele removed. Assessment unchanged from morning. Pt discharged to home with friend.

## 2012-04-27 ENCOUNTER — Emergency Department (HOSPITAL_COMMUNITY)
Admission: EM | Admit: 2012-04-27 | Discharge: 2012-04-27 | Disposition: A | Discharge status: Home / Self Care | Payer: Non-veteran care | Attending: Emergency Medicine | Admitting: Emergency Medicine

## 2012-04-27 ENCOUNTER — Encounter (HOSPITAL_COMMUNITY): Payer: Self-pay | Admitting: Family Medicine

## 2012-04-27 DIAGNOSIS — R197 Diarrhea, unspecified: Secondary | ICD-10-CM

## 2012-04-27 DIAGNOSIS — N186 End stage renal disease: Secondary | ICD-10-CM | POA: Insufficient documentation

## 2012-04-27 DIAGNOSIS — E785 Hyperlipidemia, unspecified: Secondary | ICD-10-CM | POA: Insufficient documentation

## 2012-04-27 DIAGNOSIS — I120 Hypertensive chronic kidney disease with stage 5 chronic kidney disease or end stage renal disease: Secondary | ICD-10-CM | POA: Insufficient documentation

## 2012-04-27 DIAGNOSIS — I2510 Atherosclerotic heart disease of native coronary artery without angina pectoris: Secondary | ICD-10-CM | POA: Insufficient documentation

## 2012-04-27 DIAGNOSIS — Z87891 Personal history of nicotine dependence: Secondary | ICD-10-CM | POA: Insufficient documentation

## 2012-04-27 DIAGNOSIS — Z7982 Long term (current) use of aspirin: Secondary | ICD-10-CM | POA: Insufficient documentation

## 2012-04-27 DIAGNOSIS — Z79899 Other long term (current) drug therapy: Secondary | ICD-10-CM | POA: Insufficient documentation

## 2012-04-27 DIAGNOSIS — I509 Heart failure, unspecified: Secondary | ICD-10-CM | POA: Insufficient documentation

## 2012-04-27 DIAGNOSIS — E119 Type 2 diabetes mellitus without complications: Secondary | ICD-10-CM | POA: Insufficient documentation

## 2012-04-27 DIAGNOSIS — R110 Nausea: Secondary | ICD-10-CM | POA: Insufficient documentation

## 2012-04-27 DIAGNOSIS — Z992 Dependence on renal dialysis: Secondary | ICD-10-CM | POA: Insufficient documentation

## 2012-04-27 LAB — COMPREHENSIVE METABOLIC PANEL
ALT: <5 U/L (ref 0–35)
AST: 14 U/L (ref 0–37)
Albumin: 2.4 g/dL — ABNORMAL LOW (ref 3.5–5.2)
Alkaline Phosphatase: 87 U/L (ref 39–117)
BUN: 39 mg/dL — ABNORMAL HIGH (ref 6–23)
CO2: 28 mEq/L (ref 19–32)
Calcium: 11.4 mg/dL — ABNORMAL HIGH (ref 8.4–10.5)
Chloride: 96 mEq/L (ref 96–112)
Creatinine, Ser: 5.41 mg/dL — ABNORMAL HIGH (ref 0.50–1.10)
GFR calc Af Amer: 9 mL/min — ABNORMAL LOW (ref >90)
GFR calc non Af Amer: 8 mL/min — ABNORMAL LOW (ref >90)
Glucose, Bld: 95 mg/dL (ref 70–99)
Potassium: 4.8 mEq/L (ref 3.5–5.1)
Sodium: 133 mEq/L — ABNORMAL LOW (ref 135–145)
Total Bilirubin: 0.3 mg/dL (ref 0.3–1.2)
Total Protein: 7.5 g/dL (ref 6.0–8.3)

## 2012-04-27 LAB — CBC WITH DIFFERENTIAL/PLATELET
Basophils Absolute: 0 10*3/uL (ref 0.0–0.1)
Basophils Relative: 0 % (ref 0–1)
Eosinophils Absolute: 0.1 10*3/uL (ref 0.0–0.7)
Eosinophils Relative: 1 % (ref 0–5)
HCT: 32.4 % — ABNORMAL LOW (ref 36.0–46.0)
Hemoglobin: 9.8 g/dL — ABNORMAL LOW (ref 12.0–15.0)
Lymphocytes Relative: 12 % (ref 12–46)
Lymphs Abs: 1.1 10*3/uL (ref 0.7–4.0)
MCH: 26.6 pg (ref 26.0–34.0)
MCHC: 30.2 g/dL (ref 30.0–36.0)
MCV: 87.8 fL (ref 78.0–100.0)
Monocytes Absolute: 0.8 10*3/uL (ref 0.1–1.0)
Monocytes Relative: 8 % (ref 3–12)
Neutro Abs: 7 10*3/uL (ref 1.7–7.7)
Neutrophils Relative %: 78 % — ABNORMAL HIGH (ref 43–77)
Platelets: 191 10*3/uL (ref 150–400)
RBC: 3.69 MIL/uL — ABNORMAL LOW (ref 3.87–5.11)
RDW: 17.8 % — ABNORMAL HIGH (ref 11.5–15.5)
WBC: 9 10*3/uL (ref 4.0–10.5)

## 2012-04-27 LAB — LIPASE, BLOOD: Lipase: 11 U/L (ref 11–59)

## 2012-04-27 MED ORDER — PROMETHAZINE HCL 25 MG/ML IJ SOLN
25.0000 mg | Freq: Once | INTRAMUSCULAR | Status: AC
  Administered 2012-04-27: 25 mg via INTRAMUSCULAR
  Filled 2012-04-27: qty 1

## 2012-04-27 MED ORDER — PROMETHAZINE HCL 25 MG PO TABS: 25.0000 mg | ORAL_TABLET | Freq: Three times a day (TID) | ORAL | Status: DC | PRN

## 2012-04-27 MED ORDER — HYDROMORPHONE HCL PF 1 MG/ML IJ SOLN
1.0000 mg | Freq: Once | INTRAMUSCULAR | Status: AC
  Administered 2012-04-27: 1 mg via INTRAMUSCULAR
  Filled 2012-04-27: qty 1

## 2012-04-27 MED ORDER — ONDANSETRON 4 MG PO TBDP
8.0000 mg | ORAL_TABLET | Freq: Once | ORAL | Status: DC
  Filled 2012-04-27: qty 2

## 2012-04-27 NOTE — ED Notes (Signed)
Dr. Knapp at bedside for evaluation 

## 2012-04-27 NOTE — ED Notes (Signed)
IV team at bedside to get IV access. 

## 2012-04-27 NOTE — ED Provider Notes (Signed)
Medical screening examination/treatment/procedure(s) were conducted as a shared visit with non-physician practitioner(s) and myself.  I personally evaluated the patient during the encounter  Pt without signs of significant dehydration.  History of chronic pancreatitis.   Pt tolerating PO fluids.  At this time there does not appear to be any evidence of an acute emergency medical condition and the patient appears stable for discharge with appropriate outpatient follow up.   Celene Kras, MD 04/27/12 [phone]

## 2012-04-27 NOTE — ED Notes (Signed)
Per IV team, unable to get line.

## 2012-04-27 NOTE — ED Notes (Addendum)
IV access unsuccessful attempt x 2.   Called IV team.  IV team returned call and patient placed on IV list.

## 2012-04-27 NOTE — ED Notes (Addendum)
Traci, RN tried x 2 times unsuccessfully for IV access.  Lawyer, PA advised of no access and patient is requesting pain medication.

## 2012-04-27 NOTE — ED Notes (Signed)
Per pt discharged from the hospital on Sunday and having diarrhea, nausea and vomiting. Pt sts last dialysis on Saturday. Unable to go Monday due to being sick.  sts abdominal pain and she thinks pancreatitis.

## 2012-04-27 NOTE — ED Provider Notes (Signed)
History     CSN: [id number]  Arrival date & time 04/27/12  7829   First MD Initiated Contact with Patient 04/27/12 [phone]      Chief Complaint  Patient presents with  . Diarrhea  . Nausea    (Consider location/radiation/quality/duration/timing/severity/associated sxs/prior treatment) HPI Patient presents to the emergency department with nausea, vomiting, and diarrhea. The patient states that she started with diarrhea and nausea Monday. The patient states that she had a few bouts of vomiting as well. The patient states that these issues are chronic for her. The patient also has daily abdominal pain. The patient states that she did not take anything for her symptoms other than her normal medications. The patient states that she was recently discharged from the hospital. The patient advised me that she did not go to her dialysis today or Monday.  Past Medical History  Diagnosis Date  . Gallstone pancreatitis   . Coronary artery disease     s/p CABG 2008 with multiple PCIs  . Ischemic cardiomyopathy   . End stage renal disease   . Diabetes mellitus   . Hypertension   . History of nonadherence to medical treatment   . LBBB (left bundle branch block)   . Hyperlipidemia   . Renal insufficiency   . CHF (congestive heart failure)   . Dialysis patient   . Anemia     Past Surgical History  Procedure Date  . Coronary artery bypass graft 2008  . Cholecystectomy   . Abdominal hysterectomy   . Tubal ligation   . Tonsillectomy   . Insert / replace / remove pacemaker     pacemaker ICD  . Esophagogastroduodenoscopy 09/15/2011    Procedure: ESOPHAGOGASTRODUODENOSCOPY (EGD);  Surgeon: Theda Belfast, MD;  Location: [facility] ENDOSCOPY;  Service: Endoscopy;  Laterality: N/A;    Family History  Problem Relation Age of Onset  . Coronary artery disease Mother   . Hypertension Mother   . Diabetes Mother   . Diabetes Sister   . Anesthesia problems Neg Hx     History  Substance Use Topics  .  Smoking status: Former Smoker    Quit date: 06/15/2000  . Smokeless tobacco: Not on file  . Alcohol Use: No    OB History    Grav Para Term Preterm Abortions TAB SAB Ect Mult Living                  Review of Systems All other systems negative except as documented in the HPI. All pertinent positives and negatives as reviewed in the HPI.  Allergies  Morphine and related; Ace inhibitors; Codeine; Lisinopril; and Tylenol  Home Medications   Current Outpatient Rx  Name  Route  Sig  Dispense  Refill  . CARVEDILOL 3.125 MG PO TABS   Oral   Take 1 tablet (3.125 mg total) by mouth 2 (two) times daily with a meal.   60 tablet   0   . CIPROFLOXACIN HCL 100 MG PO TABS   Oral   Take 3 tablets (300 mg total) by mouth daily.   21 tablet   0     Please take it after your hemodialysis on the dial ...   . METHADONE HCL 5 MG PO TABS   Oral   Take 1 tablet (5 mg total) by mouth 3 (three) times daily. Takes along with Dilaudid.   21 tablet   0   . RENA-VITE PO TABS   Oral   Take 1 tablet by  mouth daily.         [name] NEPRO/CARB STEADY PO LIQD   Oral   Take 237 mLs by mouth 3 (three) times daily between meals.         [name] PANTOPRAZOLE SODIUM 40 MG PO TBEC   Oral   Take 40 mg by mouth daily.         . ASPIRIN 81 MG PO TBEC   Oral   Take 1 tablet (81 mg total) by mouth daily.   90 tablet   3   . DARBEPOETIN ALFA-POLYSORBATE 100 MCG/0.5ML IJ SOLN   Intravenous   Inject 0.5 mLs (100 mcg total) into the vein every Monday with hemodialysis.   4.2 mL      . HYDROMORPHONE HCL 2 MG PO TABS   Oral   Take 1 tablet (2 mg total) by mouth 3 (three) times daily. Takes along with methadone.   21 tablet   0   . METRONIDAZOLE 250 MG PO TABS   Oral   Take 1 tablet (250 mg total) by mouth every 12 (twelve) hours.   14 tablet   0   . PRAVASTATIN SODIUM 20 MG PO TABS   Oral   Take 20 mg by mouth at bedtime.          [name] RANOLAZINE ER 500 MG PO TB12   Oral   Take 500 mg by  mouth daily.          [name] SEVELAMER HCL 800 MG PO TABS   Oral   Take 800 mg by mouth 3 (three) times daily with meals.            BP 142/89  Pulse 67  Temp 97.4 F (36.3 C)  Resp 15  SpO2 100%  Physical Exam  Nursing note and vitals reviewed. Constitutional: She appears well-developed and well-nourished. No distress.  HENT:  Head: Normocephalic and atraumatic.  Eyes: Pupils are equal, round, and reactive to light.  Cardiovascular: Normal rate and regular rhythm.  Exam reveals no gallop and no friction rub.   No murmur heard. Pulmonary/Chest: Effort normal and breath sounds normal. No respiratory distress.  Abdominal: Soft. Normal appearance and bowel sounds are normal. No hernia.      ED Course  Procedures (including critical care time)  Labs Reviewed  CBC WITH DIFFERENTIAL - Abnormal; Notable for the following:    RBC 3.69 (*)     Hemoglobin 9.8 (*)     HCT 32.4 (*)     RDW 17.8 (*)     Neutrophils Relative 78 (*)     All other components within normal limits  COMPREHENSIVE METABOLIC PANEL - Abnormal; Notable for the following:    Sodium 133 (*)     BUN 39 (*)     Creatinine, Ser 5.41 (*)     Calcium 11.4 (*)     Albumin 2.4 (*)     GFR calc non Af Amer 8 (*)     GFR calc Af Amer 9 (*)     All other components within normal limits  LIPASE, BLOOD    Dr.Knapp also saw the patient as well. The patient is stable at this visit. The patient has been tolerating oral fluids. The patient is advised to follow up with her PCP. Told to return here as needed. The patient had a recent work up for her symptoms and has been seen in the past for the same. The patient is advised to return here as needed.  MDM        Carlyle Dolly, PA-C 04/27/12 1413

## 2012-04-28 LAB — CULTURE, BLOOD (ROUTINE X 2): Culture: NO GROWTH

## 2012-05-05 ENCOUNTER — Inpatient Hospital Stay (HOSPITAL_COMMUNITY)
Admission: EM | Admit: 2012-05-05 | Discharge: 2012-05-11 | DRG: 280 | Disposition: A | Discharge status: Home / Self Care | Payer: Non-veteran care | Attending: Internal Medicine | Admitting: Internal Medicine

## 2012-05-05 ENCOUNTER — Emergency Department (HOSPITAL_COMMUNITY): Payer: Non-veteran care

## 2012-05-05 DIAGNOSIS — I509 Heart failure, unspecified: Secondary | ICD-10-CM | POA: Diagnosis present

## 2012-05-05 DIAGNOSIS — Z90710 Acquired absence of both cervix and uterus: Secondary | ICD-10-CM

## 2012-05-05 DIAGNOSIS — Z951 Presence of aortocoronary bypass graft: Secondary | ICD-10-CM

## 2012-05-05 DIAGNOSIS — D631 Anemia in chronic kidney disease: Secondary | ICD-10-CM | POA: Diagnosis present

## 2012-05-05 DIAGNOSIS — E785 Hyperlipidemia, unspecified: Secondary | ICD-10-CM | POA: Diagnosis present

## 2012-05-05 DIAGNOSIS — N189 Chronic kidney disease, unspecified: Secondary | ICD-10-CM

## 2012-05-05 DIAGNOSIS — D638 Anemia in other chronic diseases classified elsewhere: Secondary | ICD-10-CM | POA: Diagnosis present

## 2012-05-05 DIAGNOSIS — Z87891 Personal history of nicotine dependence: Secondary | ICD-10-CM

## 2012-05-05 DIAGNOSIS — I2510 Atherosclerotic heart disease of native coronary artery without angina pectoris: Secondary | ICD-10-CM | POA: Diagnosis present

## 2012-05-05 DIAGNOSIS — K861 Other chronic pancreatitis: Secondary | ICD-10-CM | POA: Diagnosis present

## 2012-05-05 DIAGNOSIS — I10 Essential (primary) hypertension: Secondary | ICD-10-CM | POA: Diagnosis present

## 2012-05-05 DIAGNOSIS — I472 Ventricular tachycardia: Secondary | ICD-10-CM | POA: Diagnosis present

## 2012-05-05 DIAGNOSIS — E119 Type 2 diabetes mellitus without complications: Secondary | ICD-10-CM | POA: Diagnosis present

## 2012-05-05 DIAGNOSIS — D696 Thrombocytopenia, unspecified: Secondary | ICD-10-CM

## 2012-05-05 DIAGNOSIS — K863 Pseudocyst of pancreas: Secondary | ICD-10-CM | POA: Diagnosis present

## 2012-05-05 DIAGNOSIS — E8809 Other disorders of plasma-protein metabolism, not elsewhere classified: Secondary | ICD-10-CM | POA: Diagnosis present

## 2012-05-05 DIAGNOSIS — I8510 Secondary esophageal varices without bleeding: Secondary | ICD-10-CM | POA: Diagnosis present

## 2012-05-05 DIAGNOSIS — I5022 Chronic systolic (congestive) heart failure: Secondary | ICD-10-CM | POA: Diagnosis present

## 2012-05-05 DIAGNOSIS — M7989 Other specified soft tissue disorders: Secondary | ICD-10-CM

## 2012-05-05 DIAGNOSIS — R110 Nausea: Secondary | ICD-10-CM

## 2012-05-05 DIAGNOSIS — Z66 Do not resuscitate: Secondary | ICD-10-CM | POA: Diagnosis present

## 2012-05-05 DIAGNOSIS — Z833 Family history of diabetes mellitus: Secondary | ICD-10-CM

## 2012-05-05 DIAGNOSIS — Z95810 Presence of automatic (implantable) cardiac defibrillator: Secondary | ICD-10-CM

## 2012-05-05 DIAGNOSIS — R109 Unspecified abdominal pain: Secondary | ICD-10-CM

## 2012-05-05 DIAGNOSIS — I120 Hypertensive chronic kidney disease with stage 5 chronic kidney disease or end stage renal disease: Secondary | ICD-10-CM | POA: Diagnosis present

## 2012-05-05 DIAGNOSIS — R079 Chest pain, unspecified: Secondary | ICD-10-CM

## 2012-05-05 DIAGNOSIS — I2589 Other forms of chronic ischemic heart disease: Secondary | ICD-10-CM | POA: Diagnosis present

## 2012-05-05 DIAGNOSIS — I214 Non-ST elevation (NSTEMI) myocardial infarction: Secondary | ICD-10-CM

## 2012-05-05 DIAGNOSIS — N186 End stage renal disease: Secondary | ICD-10-CM

## 2012-05-05 DIAGNOSIS — Z9089 Acquired absence of other organs: Secondary | ICD-10-CM

## 2012-05-05 DIAGNOSIS — Z8249 Family history of ischemic heart disease and other diseases of the circulatory system: Secondary | ICD-10-CM

## 2012-05-05 DIAGNOSIS — I447 Left bundle-branch block, unspecified: Secondary | ICD-10-CM | POA: Diagnosis present

## 2012-05-05 DIAGNOSIS — Z992 Dependence on renal dialysis: Secondary | ICD-10-CM

## 2012-05-05 DIAGNOSIS — E876 Hypokalemia: Secondary | ICD-10-CM | POA: Diagnosis present

## 2012-05-05 DIAGNOSIS — I255 Ischemic cardiomyopathy: Secondary | ICD-10-CM | POA: Diagnosis present

## 2012-05-05 HISTORY — DX: Pneumonia, unspecified organism: J18.9

## 2012-05-05 LAB — CBC
HCT: 28.7 % — ABNORMAL LOW (ref 36.0–46.0)
Hemoglobin: 8.6 g/dL — ABNORMAL LOW (ref 12.0–15.0)
MCH: 27.3 pg (ref 26.0–34.0)
MCHC: 30 g/dL (ref 30.0–36.0)
MCV: 91.1 fL (ref 78.0–100.0)
RDW: 19.7 % — ABNORMAL HIGH (ref 11.5–15.5)

## 2012-05-05 LAB — BASIC METABOLIC PANEL
BUN: 9 mg/dL (ref 6–23)
Calcium: 8.8 mg/dL (ref 8.4–10.5)
Creatinine, Ser: 2.62 mg/dL — ABNORMAL HIGH (ref 0.50–1.10)
GFR calc Af Amer: 21 mL/min — ABNORMAL LOW (ref >90)
GFR calc non Af Amer: 18 mL/min — ABNORMAL LOW (ref >90)
Glucose, Bld: 160 mg/dL — ABNORMAL HIGH (ref 70–99)
Potassium: 2.8 mEq/L — ABNORMAL LOW (ref 3.5–5.1)

## 2012-05-05 LAB — POCT I-STAT TROPONIN I

## 2012-05-05 MED ORDER — ONDANSETRON HCL 4 MG/2ML IJ SOLN
4.0000 mg | Freq: Once | INTRAMUSCULAR | Status: AC
  Administered 2012-05-05: 4 mg via INTRAVENOUS
  Filled 2012-05-05: qty 2

## 2012-05-05 MED ORDER — SODIUM CHLORIDE 0.9 % IV SOLN
Freq: Once | INTRAVENOUS | Status: AC
  Administered 2012-05-05: 22:00:00 via INTRAVENOUS

## 2012-05-05 MED ORDER — ASPIRIN 325 MG PO TABS
325.0000 mg | ORAL_TABLET | Freq: Every day | ORAL | Status: DC
  Filled 2012-05-05: qty 1

## 2012-05-05 MED ORDER — MORPHINE SULFATE 4 MG/ML IJ SOLN: 4.0000 mg | Freq: Once | INTRAMUSCULAR | Status: DC

## 2012-05-05 MED ORDER — HYDROMORPHONE HCL PF 1 MG/ML IJ SOLN
1.0000 mg | Freq: Once | INTRAMUSCULAR | Status: AC
  Administered 2012-05-05: 1 mg via INTRAVENOUS
  Filled 2012-05-05: qty 1

## 2012-05-05 MED ORDER — ASPIRIN EC 325 MG PO TBEC
325.0000 mg | DELAYED_RELEASE_TABLET | Freq: Once | ORAL | Status: AC
  Administered 2012-05-05: 325 mg via ORAL
  Filled 2012-05-05: qty 1

## 2012-05-05 MED ORDER — POTASSIUM CHLORIDE 10 MEQ/100ML IV SOLN
10.0000 meq | Freq: Once | INTRAVENOUS | Status: AC
  Administered 2012-05-06: 10 meq via INTRAVENOUS
  Filled 2012-05-05: qty 100

## 2012-05-05 NOTE — ED Provider Notes (Signed)
This was a shared the encounter.  On my exam the patient was sitting upright, smiling.  She was in no distress.  We discussed the patient's elevated troponin, her decreased potassium.  Given these findings, the patient's multiple comorbidities, including history of coronary disease, and her pacemaker, she was admitted for further evaluation and management.  On my exam the patient was in no pain, not requiring emergent intervention.  I saw the ECG, relevant labs and studies - I agree with the interpretation.   Gerhard Munch, MD 05/05/12 [phone]

## 2012-05-05 NOTE — ED Notes (Signed)
While at Dialysis this afternoon began to have chest discomfort, dialysis gave her saline and pain subsided. Went home from dialysis and began to have chest pain again. PT took nitro at home and ASA 324. Pt is currently pain free. One month ago had new fistula placed in left arm. Pt has pacemaker/defibrillator.

## 2012-05-05 NOTE — ED Provider Notes (Signed)
History     CSN: [id number]  Arrival date & time 05/05/12  2049   First MD Initiated Contact with Patient 05/05/12 2057      Chief Complaint  Patient presents with  . Chest Pain    (Consider location/radiation/quality/duration/timing/severity/associated sxs/prior treatment) The history is provided by the patient and medical records.    [name] is a 61 y.o. female presents emergency department complaining of chest pain.  Patient states the pain began this afternoon at dialysis, began in her right axillary radiated to her sternum. They gave her a fluid bolus of normal saline and the pain subsided. Patient then went home from dialysis, took a nap and awoke with chest pain about 8 PM.  Patient states the pain again begins her right axilla and radiates to her mid sternum, she describes it as heaviness, is rated at a 6/10 and does not radiate anywhere else. The pain began acutely, has been persistent and gradually worsened. Patient has a history of many medical problems, but has not ever had an MI. Patient has a history of coronary artery disease, had a CABG in 2008 and does have a pacemaker/ICD in place.  Nothing makes the pain better and nothing makes it worse. Patient had associated nausea, diaphoresis, lightheadedness, and near syncopal episode after onset of the pain.  Patient denies headache, fever, chills, abdominal pain, vomiting, diarrhea, constipation, weakness.  Patient's cardiologist is Dr. ward at the [facility].  Past Medical History  Diagnosis Date  . Gallstone pancreatitis   . Coronary artery disease     s/p CABG 2008 with multiple PCIs  . Ischemic cardiomyopathy   . End stage renal disease   . Diabetes mellitus   . Hypertension   . History of nonadherence to medical treatment   . LBBB (left bundle branch block)   . Hyperlipidemia   . Renal insufficiency   . CHF (congestive heart failure)   . Dialysis patient   . Anemia     Past Surgical History  Procedure  Date  . Coronary artery bypass graft 2008  . Cholecystectomy   . Abdominal hysterectomy   . Tubal ligation   . Tonsillectomy   . Insert / replace / remove pacemaker     pacemaker ICD  . Esophagogastroduodenoscopy 09/15/2011    Procedure: ESOPHAGOGASTRODUODENOSCOPY (EGD);  Surgeon: Theda Belfast, MD;  Location: [facility] ENDOSCOPY;  Service: Endoscopy;  Laterality: N/A;    Family History  Problem Relation Age of Onset  . Coronary artery disease Mother   . Hypertension Mother   . Diabetes Mother   . Diabetes Sister   . Anesthesia problems Neg Hx     History  Substance Use Topics  . Smoking status: Former Smoker    Quit date: 06/15/2000  . Smokeless tobacco: Not on file  . Alcohol Use: No    OB History    Grav Para Term Preterm Abortions TAB SAB Ect Mult Living                  Review of Systems  Constitutional: Positive for diaphoresis. Negative for fever, appetite change, fatigue and unexpected weight change.  HENT: Negative for mouth sores and neck stiffness.   Eyes: Negative for visual disturbance.  Respiratory: Negative for cough, chest tightness, shortness of breath and wheezing.   Cardiovascular: Positive for chest pain.  Gastrointestinal: Positive for nausea. Negative for vomiting, abdominal pain, diarrhea and constipation.  Genitourinary: Negative for dysuria, urgency, frequency and hematuria.  Skin: Negative for  rash.  Neurological: Positive for light-headedness. Negative for syncope and headaches.  Psychiatric/Behavioral: Negative for sleep disturbance. The patient is not nervous/anxious.   All other systems reviewed and are negative.    Allergies  Morphine and related; Ace inhibitors; Codeine; Lisinopril; and Tylenol  Home Medications   Current Outpatient Rx  Name  Route  Sig  Dispense  Refill  . CARVEDILOL 3.125 MG PO TABS   Oral   Take 1 tablet (3.125 mg total) by mouth 2 (two) times daily with a meal.   60 tablet   0   . CIPROFLOXACIN HCL 500 MG PO  TABS   Oral   Take 500 mg by mouth daily.         [name] DARBEPOETIN ALFA-POLYSORBATE 100 MCG/0.5ML IJ SOLN   Intravenous   Inject 0.5 mLs (100 mcg total) into the vein every Monday with hemodialysis.   4.2 mL      . HYDROMORPHONE HCL 2 MG PO TABS   Oral   Take 1 tablet (2 mg total) by mouth 3 (three) times daily. Takes along with methadone.   21 tablet   0   . MAGNESIUM OXIDE 420 MG PO TABS   Oral   Take 1 tablet by mouth daily.         [name] METHADONE HCL 5 MG PO TABS   Oral   Take 1 tablet (5 mg total) by mouth 3 (three) times daily. Takes along with Dilaudid.   21 tablet   0   . MIDODRINE HCL 5 MG PO TABS   Oral   Take 5 mg by mouth 2 (two) times daily.         [name] RENA-VITE PO TABS   Oral   Take 1 tablet by mouth daily.         [name] NEPRO/CARB STEADY PO LIQD   Oral   Take 237 mLs by mouth 2 (two) times daily with breakfast and lunch.         [name] PANTOPRAZOLE SODIUM 40 MG PO TBEC   Oral   Take 40 mg by mouth daily.         [name] PRAVASTATIN SODIUM 20 MG PO TABS   Oral   Take 20 mg by mouth at bedtime.          [name] RANOLAZINE ER 500 MG PO TB12   Oral   Take 500 mg by mouth daily.          . SENNA 8.6 MG PO TABS   Oral   Take 1 tablet by mouth 2 (two) times daily.         [name] SEVELAMER HCL 400 MG PO TABS   Oral   Take 400 mg by mouth daily.           BP 108/69  Pulse 66  Temp 97.9 F (36.6 C) (Oral)  Resp 14  SpO2 99%  Physical Exam  Nursing note and vitals reviewed. Constitutional: She is oriented to person, place, and time. She appears well-developed and well-nourished. No distress.  HENT:  Head: Normocephalic and atraumatic.  Mouth/Throat: Oropharynx is clear and moist. No oropharyngeal exudate.  Eyes: Conjunctivae normal and EOM are normal. Pupils are equal, round, and reactive to light. No scleral icterus.  Neck: Normal range of motion. Neck supple.  Cardiovascular: Normal rate, regular rhythm, normal heart sounds and intact distal pulses.   Exam reveals no gallop and no friction rub.   Pulmonary/Chest: Effort normal and breath sounds normal. No  respiratory distress. She has no wheezes. She has no rales. She exhibits no tenderness.  Abdominal: Soft. Bowel sounds are normal. She exhibits no mass. There is no tenderness. There is no rebound and no guarding.  Musculoskeletal: Normal range of motion. She exhibits edema (left arm secondary to fistula placement one month ago). She exhibits no tenderness.  Lymphadenopathy:    She has no cervical adenopathy.  Neurological: She is alert and oriented to person, place, and time. She exhibits normal muscle tone. Coordination normal.       Speech is clear and goal oriented Moves extremities without ataxia  Skin: Skin is warm and dry. No rash noted. She is not diaphoretic.  Psychiatric: She has a normal mood and affect.    ED Course  Procedures (including critical care time)  Labs Reviewed  CBC - Abnormal; Notable for the following:    RBC 3.15 (*)     Hemoglobin 8.6 (*)     HCT 28.7 (*)     RDW 19.7 (*)     Platelets 113 (*)  PLATELET COUNT CONFIRMED BY SMEAR   All other components within normal limits  BASIC METABOLIC PANEL - Abnormal; Notable for the following:    Sodium 134 (*)     Potassium 2.8 (*)     Chloride 95 (*)     Glucose, Bld 160 (*)     Creatinine, Ser 2.62 (*)     GFR calc non Af Amer 18 (*)     GFR calc Af Amer 21 (*)     All other components within normal limits  POCT I-STAT TROPONIN I   Dg Chest Port 1 View  05/05/2012  *RADIOLOGY REPORT*  Clinical Data:  Chest pain  PORTABLE CHEST - 1 VIEW  Comparison: Portable exam 2120 hours compared to 04/21/2012  Findings: Right jugular dual-lumen central venous catheter tip projecting over right atrium. Left subclavian transvenous pacemaker / AICD leads project over right atrium, right ventricle, and coronary sinus. Enlargement of cardiac silhouette post CABG. Pulmonary vascular ingestion. Mild perihilar infiltrates  suspect edema. No gross pleural effusion or pneumothorax.  IMPRESSION: Enlargement of cardiac silhouette with pulmonary vascular congestion post CABG and pacemaker / AICD. Probable perihilar edema.   Original Report Authenticated By: Ulyses Southward, M.D.    ECG:  Date: 05/05/2012  Rate: 70  Rhythm: normal sinus rhythm and premature ventricular contractions (PVC)  QRS Axis: left  Intervals: normal  ST/T Wave abnormalities: nonspecific T wave changes  Conduction Disutrbances:left bundle branch block  Narrative Interpretation: non STEMI, LBBB  Old EKG Reviewed: unchanged    1. Chest pain   2. Hypokalemia   3. Nausea       MDM  [name] presents to the emergency department complaining of chest pain.  CBC with anemia however around patient's baseline, BMP with hypokalemia at 2.8 replaced in the department.  Chest x-ray with pulmonary vascular congestion, patient states this is normal for her and she is having no shortness of breath.  Troponin resulted at 0.08.  ECG without STEMI.  Patient remains alert, oriented in NAD, nontoxic, nonseptic appearing.  We'll proceed with admission due to patient's risk factors and need for overnight observation and delta troponins.  Dr. Gerhard Munch was consulted, evaluated this patient with me and agrees with the plan.          Dahlia Client Erricka Falkner, PA-C 05/05/12 2333

## 2012-05-06 ENCOUNTER — Encounter (HOSPITAL_COMMUNITY)
Admission: EM | Disposition: A | Discharge status: Home / Self Care | Payer: Self-pay | Source: Home / Self Care | Attending: Internal Medicine

## 2012-05-06 ENCOUNTER — Encounter (HOSPITAL_COMMUNITY): Payer: Self-pay | Admitting: *Deleted

## 2012-05-06 DIAGNOSIS — I472 Ventricular tachycardia: Secondary | ICD-10-CM | POA: Diagnosis present

## 2012-05-06 DIAGNOSIS — Z95810 Presence of automatic (implantable) cardiac defibrillator: Secondary | ICD-10-CM | POA: Diagnosis present

## 2012-05-06 DIAGNOSIS — N186 End stage renal disease: Secondary | ICD-10-CM

## 2012-05-06 DIAGNOSIS — Z992 Dependence on renal dialysis: Secondary | ICD-10-CM

## 2012-05-06 DIAGNOSIS — D631 Anemia in chronic kidney disease: Secondary | ICD-10-CM

## 2012-05-06 DIAGNOSIS — E876 Hypokalemia: Secondary | ICD-10-CM

## 2012-05-06 DIAGNOSIS — I2510 Atherosclerotic heart disease of native coronary artery without angina pectoris: Secondary | ICD-10-CM | POA: Diagnosis present

## 2012-05-06 DIAGNOSIS — N189 Chronic kidney disease, unspecified: Secondary | ICD-10-CM

## 2012-05-06 DIAGNOSIS — I214 Non-ST elevation (NSTEMI) myocardial infarction: Secondary | ICD-10-CM | POA: Diagnosis present

## 2012-05-06 HISTORY — PX: LEFT HEART CATHETERIZATION WITH CORONARY ANGIOGRAM: SHX5451

## 2012-05-06 HISTORY — DX: End stage renal disease: N18.6

## 2012-05-06 HISTORY — PX: GRAFT(S) ANGIOGRAM: SHX5479

## 2012-05-06 LAB — BASIC METABOLIC PANEL
BUN: 10 mg/dL (ref 6–23)
BUN: 12 mg/dL (ref 6–23)
CO2: 33 mEq/L — ABNORMAL HIGH (ref 19–32)
CO2: 29 mEq/L (ref 19–32)
Calcium: 9.6 mg/dL (ref 8.4–10.5)
Calcium: 10 mg/dL (ref 8.4–10.5)
Chloride: 98 mEq/L (ref 96–112)
Creatinine, Ser: 3.23 mg/dL — ABNORMAL HIGH (ref 0.50–1.10)
Creatinine, Ser: 3.34 mg/dL — ABNORMAL HIGH (ref 0.50–1.10)
GFR calc non Af Amer: 14 mL/min — ABNORMAL LOW (ref >90)
Glucose, Bld: 80 mg/dL (ref 70–99)
Glucose, Bld: 194 mg/dL — ABNORMAL HIGH (ref 70–99)

## 2012-05-06 LAB — CBC
HCT: 27.4 % — ABNORMAL LOW (ref 36.0–46.0)
HCT: 28.5 % — ABNORMAL LOW (ref 36.0–46.0)
Hemoglobin: 8.4 g/dL — ABNORMAL LOW (ref 12.0–15.0)
MCH: 27.4 pg (ref 26.0–34.0)
MCHC: 29.6 g/dL — ABNORMAL LOW (ref 30.0–36.0)
MCV: 91.6 fL (ref 78.0–100.0)
RBC: 3.11 MIL/uL — ABNORMAL LOW (ref 3.87–5.11)
RDW: 19.9 % — ABNORMAL HIGH (ref 11.5–15.5)
RDW: 19.9 % — ABNORMAL HIGH (ref 11.5–15.5)
WBC: 8 10*3/uL (ref 4.0–10.5)

## 2012-05-06 LAB — COMPREHENSIVE METABOLIC PANEL
AST: 17 U/L (ref 0–37)
BUN: 10 mg/dL (ref 6–23)
CO2: 31 mEq/L (ref 19–32)
Calcium: 9.2 mg/dL (ref 8.4–10.5)
Creatinine, Ser: 3.03 mg/dL — ABNORMAL HIGH (ref 0.50–1.10)
GFR calc Af Amer: 18 mL/min — ABNORMAL LOW (ref >90)
GFR calc non Af Amer: 15 mL/min — ABNORMAL LOW (ref >90)

## 2012-05-06 LAB — TSH: TSH: 2.775 u[IU]/mL (ref 0.350–4.500)

## 2012-05-06 LAB — GLUCOSE, CAPILLARY
Glucose-Capillary: 88 mg/dL (ref 70–99)
Glucose-Capillary: 86 mg/dL (ref 70–99)
Glucose-Capillary: 123 mg/dL — ABNORMAL HIGH (ref 70–99)
Glucose-Capillary: 202 mg/dL — ABNORMAL HIGH (ref 70–99)

## 2012-05-06 LAB — TROPONIN I
Troponin I: 1.39 ng/mL (ref <0.30)
Troponin I: 1.36 ng/mL (ref <0.30)
Troponin I: 0.6 ng/mL (ref <0.30)

## 2012-05-06 LAB — HEMOGLOBIN A1C: Mean Plasma Glucose: 91 mg/dL (ref <117)

## 2012-05-06 LAB — LIPID PANEL
Cholesterol: 91 mg/dL (ref 0–200)
Triglycerides: 44 mg/dL (ref <150)

## 2012-05-06 LAB — POCT I-STAT TROPONIN I

## 2012-05-06 LAB — PHOSPHORUS: Phosphorus: 3.4 mg/dL (ref 2.3–4.6)

## 2012-05-06 LAB — HEPARIN LEVEL (UNFRACTIONATED): Heparin Unfractionated: 0.13 IU/mL — ABNORMAL LOW (ref 0.30–0.70)

## 2012-05-06 SURGERY — LEFT HEART CATHETERIZATION WITH CORONARY ANGIOGRAM
Anesthesia: LOCAL

## 2012-05-06 MED ORDER — NEPRO/CARBSTEADY PO LIQD
237.0000 mL | Freq: Two times a day (BID) | ORAL | Status: DC
  Administered 2012-05-06 – 2012-05-11 (×9): 237 mL via ORAL
  Filled 2012-05-06 (×14): qty 237

## 2012-05-06 MED ORDER — HYDRALAZINE HCL 25 MG PO TABS
12.5000 mg | ORAL_TABLET | Freq: Three times a day (TID) | ORAL | Status: DC
  Administered 2012-05-06 – 2012-05-08 (×6): 12.5 mg via ORAL
  Filled 2012-05-06 (×9): qty 0.5

## 2012-05-06 MED ORDER — INSULIN ASPART 100 UNIT/ML ~~LOC~~ SOLN
0.0000 [IU] | Freq: Three times a day (TID) | SUBCUTANEOUS | Status: DC
  Administered 2012-05-06: 1 [IU] via SUBCUTANEOUS
  Administered 2012-05-07: 2 [IU] via SUBCUTANEOUS
  Administered 2012-05-08: 1 [IU] via SUBCUTANEOUS
  Administered 2012-05-08: 3 [IU] via SUBCUTANEOUS
  Administered 2012-05-09 – 2012-05-10 (×3): 2 [IU] via SUBCUTANEOUS
  Administered 2012-05-10: 1 [IU] via SUBCUTANEOUS
  Administered 2012-05-10: 5 [IU] via SUBCUTANEOUS

## 2012-05-06 MED ORDER — ASPIRIN EC 325 MG PO TBEC: 325.0000 mg | DELAYED_RELEASE_TABLET | Freq: Every day | ORAL | Status: DC

## 2012-05-06 MED ORDER — ACETAMINOPHEN 650 MG RE SUPP: 650.0000 mg | Freq: Four times a day (QID) | RECTAL | Status: DC | PRN

## 2012-05-06 MED ORDER — ASPIRIN EC 325 MG PO TBEC
325.0000 mg | DELAYED_RELEASE_TABLET | Freq: Every day | ORAL | Status: DC
  Administered 2012-05-06: 325 mg via ORAL
  Filled 2012-05-06 (×2): qty 1

## 2012-05-06 MED ORDER — ATORVASTATIN CALCIUM 40 MG PO TABS
40.0000 mg | ORAL_TABLET | Freq: Every day | ORAL | Status: DC
  Administered 2012-05-06 – 2012-05-10 (×4): 40 mg via ORAL
  Filled 2012-05-06 (×6): qty 1

## 2012-05-06 MED ORDER — NITROGLYCERIN 0.2 MG/ML ON CALL CATH LAB
INTRAVENOUS | Status: AC
  Filled 2012-05-06: qty 1

## 2012-05-06 MED ORDER — HYDROMORPHONE HCL PF 1 MG/ML IJ SOLN
0.5000 mg | INTRAMUSCULAR | Status: DC | PRN
  Administered 2012-05-06 – 2012-05-11 (×19): 0.5 mg via INTRAVENOUS
  Filled 2012-05-06 (×19): qty 1

## 2012-05-06 MED ORDER — HEPARIN BOLUS VIA INFUSION
1500.0000 [IU] | Freq: Once | INTRAVENOUS | Status: DC
  Filled 2012-05-06: qty 1500

## 2012-05-06 MED ORDER — SODIUM CHLORIDE 0.9 % IV SOLN
INTRAVENOUS | Status: DC | PRN
  Administered 2012-05-06: 04:00:00 via INTRAVENOUS

## 2012-05-06 MED ORDER — HEPARIN (PORCINE) IN NACL 100-0.45 UNIT/ML-% IJ SOLN
850.0000 [IU]/h | INTRAMUSCULAR | Status: DC
  Administered 2012-05-06: 700 [IU]/h via INTRAVENOUS
  Filled 2012-05-06 (×2): qty 250

## 2012-05-06 MED ORDER — SEVELAMER CARBONATE 800 MG PO TABS
800.0000 mg | ORAL_TABLET | Freq: Three times a day (TID) | ORAL | Status: DC
  Administered 2012-05-06 – 2012-05-11 (×9): 800 mg via ORAL
  Filled 2012-05-06 (×17): qty 1

## 2012-05-06 MED ORDER — HEPARIN (PORCINE) IN NACL 2-0.9 UNIT/ML-% IJ SOLN
INTRAMUSCULAR | Status: AC
  Filled 2012-05-06: qty 1000

## 2012-05-06 MED ORDER — POTASSIUM CHLORIDE 10 MEQ/100ML IV SOLN
10.0000 meq | Freq: Once | INTRAVENOUS | Status: DC
  Administered 2012-05-06: 10 meq via INTRAVENOUS
  Filled 2012-05-06: qty 100

## 2012-05-06 MED ORDER — ONDANSETRON HCL 4 MG/2ML IJ SOLN: 4.0000 mg | Freq: Four times a day (QID) | INTRAMUSCULAR | Status: DC | PRN

## 2012-05-06 MED ORDER — NEPRO/CARBSTEADY PO LIQD
237.0000 mL | Freq: Two times a day (BID) | ORAL | Status: DC
  Filled 2012-05-06 (×4): qty 237

## 2012-05-06 MED ORDER — ATORVASTATIN CALCIUM 80 MG PO TABS
80.0000 mg | ORAL_TABLET | Freq: Every day | ORAL | Status: DC
  Filled 2012-05-06 (×2): qty 1

## 2012-05-06 MED ORDER — FENTANYL CITRATE 0.05 MG/ML IJ SOLN
INTRAMUSCULAR | Status: AC
  Filled 2012-05-06: qty 2

## 2012-05-06 MED ORDER — CAMPHOR-MENTHOL 0.5-0.5 % EX LOTN
1.0000 "application " | TOPICAL_LOTION | Freq: Three times a day (TID) | CUTANEOUS | Status: DC | PRN
  Filled 2012-05-06: qty 222

## 2012-05-06 MED ORDER — MIDAZOLAM HCL 2 MG/2ML IJ SOLN
INTRAMUSCULAR | Status: AC
  Filled 2012-05-06: qty 2

## 2012-05-06 MED ORDER — FENTANYL CITRATE 0.05 MG/ML IJ SOLN
INTRAMUSCULAR | Status: AC
Start: 2012-05-06 — End: 2012-05-06
  Filled 2012-05-06: qty 2

## 2012-05-06 MED ORDER — RENA-VITE PO TABS
1.0000 | ORAL_TABLET | Freq: Every day | ORAL | Status: DC
  Administered 2012-05-06 – 2012-05-11 (×6): 1 via ORAL
  Filled 2012-05-06 (×6): qty 1

## 2012-05-06 MED ORDER — NITROGLYCERIN IN D5W 200-5 MCG/ML-% IV SOLN
2.0000 ug/min | INTRAVENOUS | Status: DC
  Administered 2012-05-06: 5 ug/min via INTRAVENOUS
  Filled 2012-05-06: qty 250

## 2012-05-06 MED ORDER — HEPARIN BOLUS VIA INFUSION
1500.0000 [IU] | Freq: Once | INTRAVENOUS | Status: AC
  Administered 2012-05-06: 1500 [IU] via INTRAVENOUS
  Filled 2012-05-06: qty 1500

## 2012-05-06 MED ORDER — SENNA 8.6 MG PO TABS
1.0000 | ORAL_TABLET | Freq: Two times a day (BID) | ORAL | Status: DC
  Administered 2012-05-08 – 2012-05-10 (×2): 8.6 mg via ORAL
  Filled 2012-05-06 (×12): qty 1

## 2012-05-06 MED ORDER — CARVEDILOL 3.125 MG PO TABS
3.1250 mg | ORAL_TABLET | Freq: Two times a day (BID) | ORAL | Status: DC
  Administered 2012-05-06 (×2): 3.125 mg via ORAL
  Filled 2012-05-06 (×5): qty 1

## 2012-05-06 MED ORDER — ACETAMINOPHEN 325 MG PO TABS: 650.0000 mg | ORAL_TABLET | ORAL | Status: DC | PRN

## 2012-05-06 MED ORDER — HYDROXYZINE HCL 25 MG PO TABS
25.0000 mg | ORAL_TABLET | Freq: Three times a day (TID) | ORAL | Status: DC | PRN
  Administered 2012-05-08: 25 mg via ORAL
  Filled 2012-05-06: qty 1

## 2012-05-06 MED ORDER — CIPROFLOXACIN HCL 500 MG PO TABS
500.0000 mg | ORAL_TABLET | Freq: Every day | ORAL | Status: DC
  Administered 2012-05-06 – 2012-05-09 (×4): 500 mg via ORAL
  Filled 2012-05-06 (×4): qty 1

## 2012-05-06 MED ORDER — METHADONE HCL 10 MG PO TABS
5.0000 mg | ORAL_TABLET | Freq: Three times a day (TID) | ORAL | Status: DC
  Administered 2012-05-06 – 2012-05-11 (×15): 5 mg via ORAL
  Filled 2012-05-06 (×3): qty 1
  Filled 2012-05-06: qty 2
  Filled 2012-05-06 (×11): qty 1

## 2012-05-06 MED ORDER — ISOSORBIDE MONONITRATE ER 30 MG PO TB24
30.0000 mg | ORAL_TABLET | Freq: Every day | ORAL | Status: DC
  Administered 2012-05-06 – 2012-05-11 (×6): 30 mg via ORAL
  Filled 2012-05-06 (×6): qty 1

## 2012-05-06 MED ORDER — ZOLPIDEM TARTRATE 5 MG PO TABS: 5.0000 mg | ORAL_TABLET | Freq: Every evening | ORAL | Status: DC | PRN

## 2012-05-06 MED ORDER — ALPRAZOLAM 0.25 MG PO TABS: 0.2500 mg | ORAL_TABLET | Freq: Two times a day (BID) | ORAL | Status: DC | PRN

## 2012-05-06 MED ORDER — CALCIUM CARBONATE 1250 MG/5ML PO SUSP
500.0000 mg | Freq: Four times a day (QID) | ORAL | Status: DC | PRN
  Filled 2012-05-06: qty 5

## 2012-05-06 MED ORDER — LIDOCAINE HCL (PF) 1 % IJ SOLN
INTRAMUSCULAR | Status: AC
  Filled 2012-05-06: qty 30

## 2012-05-06 MED ORDER — NEPRO/CARBSTEADY PO LIQD
237.0000 mL | Freq: Three times a day (TID) | ORAL | Status: DC | PRN
  Filled 2012-05-06: qty 237

## 2012-05-06 MED ORDER — DARBEPOETIN ALFA-POLYSORBATE 150 MCG/0.3ML IJ SOLN
150.0000 ug | INTRAMUSCULAR | Status: DC
  Administered 2012-05-07: 150 ug via INTRAVENOUS
  Filled 2012-05-06: qty 0.3

## 2012-05-06 MED ORDER — ACETAMINOPHEN 325 MG PO TABS: 650.0000 mg | ORAL_TABLET | Freq: Four times a day (QID) | ORAL | Status: DC | PRN

## 2012-05-06 MED ORDER — SODIUM CHLORIDE 0.9 % IV SOLN: INTRAVENOUS | Status: DC

## 2012-05-06 MED ORDER — DOCUSATE SODIUM 283 MG RE ENEM
1.0000 | ENEMA | RECTAL | Status: DC | PRN
  Filled 2012-05-06: qty 1

## 2012-05-06 MED ORDER — HEPARIN BOLUS VIA INFUSION
3000.0000 [IU] | Freq: Once | INTRAVENOUS | Status: AC
  Administered 2012-05-06: 3000 [IU] via INTRAVENOUS

## 2012-05-06 MED ORDER — PANTOPRAZOLE SODIUM 40 MG PO TBEC
40.0000 mg | DELAYED_RELEASE_TABLET | Freq: Every day | ORAL | Status: DC
  Administered 2012-05-06 – 2012-05-11 (×6): 40 mg via ORAL
  Filled 2012-05-06 (×4): qty 1

## 2012-05-06 MED ORDER — SORBITOL 70 % SOLN
30.0000 mL | Status: DC | PRN
  Filled 2012-05-06: qty 30

## 2012-05-06 MED ORDER — ONDANSETRON HCL 4 MG PO TABS: 4.0000 mg | ORAL_TABLET | Freq: Four times a day (QID) | ORAL | Status: DC | PRN

## 2012-05-06 MED ORDER — POTASSIUM CHLORIDE CRYS ER 20 MEQ PO TBCR: 40.0000 meq | EXTENDED_RELEASE_TABLET | ORAL | Status: DC

## 2012-05-06 MED ORDER — ONDANSETRON HCL 4 MG/2ML IJ SOLN
4.0000 mg | Freq: Four times a day (QID) | INTRAMUSCULAR | Status: DC | PRN
  Administered 2012-05-07: 4 mg via INTRAVENOUS
  Filled 2012-05-06: qty 2

## 2012-05-06 MED ORDER — SODIUM CHLORIDE 0.9 % IJ SOLN
3.0000 mL | Freq: Two times a day (BID) | INTRAMUSCULAR | Status: DC
  Administered 2012-05-06 – 2012-05-07 (×4): 3 mL via INTRAVENOUS
  Administered 2012-05-08: 10:00:00 via INTRAVENOUS
  Administered 2012-05-08 – 2012-05-10 (×5): 3 mL via INTRAVENOUS

## 2012-05-06 NOTE — Progress Notes (Signed)
ANTICOAGULATION CONSULT NOTE - Initial Consult  Pharmacy Consult for heparin Indication: chest pain/ACS  Allergies  Allergen Reactions  . Morphine And Related Anaphylaxis  . Ace Inhibitors Other (See Comments)    Unknown reaction but her physician stated that she can't take it  . Codeine Itching  . Lisinopril Other (See Comments)    Unknown reaction but her physician stated that she can't take it  . Tylenol (Acetaminophen) Other (See Comments)    Patient states that the doctor told her she has a Fatty Liver.    Patient Measurements: Height: 5\' 4"  (162.6 cm) Weight: 119 lb 14.9 oz (54.4 kg) IBW/kg (Calculated) : 54.7   Vital Signs: Temp: 97.9 F (36.6 C) (11/21 2058) Temp src: Oral (11/21 1914) BP: 108/69 mmHg (11/21 2230) Pulse Rate: 66  (11/21 2230)  Labs:  [facility] 05/05/12 2058  HGB 8.6*  HCT 28.7*  PLT 113*  APTT --  LABPROT --  INR --  HEPARINUNFRC --  CREATININE 2.62*  CKTOTAL --  CKMB --  TROPONINI --    Estimated Creatinine Clearance: 19.1 ml/min (by C-G formula based on Cr of 2.62).   Medical History: Past Medical History  Diagnosis Date  . Gallstone pancreatitis   . Coronary artery disease     s/p CABG 2008 with multiple PCIs  . Ischemic cardiomyopathy   . End stage renal disease   . Diabetes mellitus   . Hypertension   . History of nonadherence to medical treatment   . LBBB (left bundle branch block)   . Hyperlipidemia   . Renal insufficiency   . CHF (congestive heart failure)   . Dialysis patient   . Anemia     Assessment: 61yo female with ESRD and h/o CAD w/ CABG in 2008 c/o CP that began after HD session which subsequently subsided then returned this pm, i-stat troponin found to be mildly elevated, to begin heparin.  Goal of Therapy:  Heparin level 0.3-0.7 units/ml Monitor platelets by anticoagulation protocol: Yes   Plan:  Will give heparin bolus of 3000 units x1 followed by gtt at 700 units/hr and monitor heparin levels and  CBC.  Colleen Can PharmD BCPS 05/06/2012,1:22 AM

## 2012-05-06 NOTE — CV Procedure (Signed)
Cardiac Catherization  [name], 61 y.o., female  Full note dictated; see diagram in chart  DICTATION # [id number]; [id number]  Coronary Calcification LM: nl LAD: ostial prox 70%, patent proximal LAD stent RI: 50 - 60% proximal and mid, 80% OM1 RCA: 80% proximal, 60 and 90% ostial RV branches  SVG to RCA: patent SVG to LCX OM: patent SVG to DX: appears moderately atretic extends to DX which is very small LIMA to LAD: patent with 60% LAD disease post anastomosis  EF 10% with dilated severely hypocontractile LV.  Mynx closure 5 Fr RFA,.  Medical therapy.    Lennette Bihari, MD, [facility] 05/06/2012 4:15 PM

## 2012-05-06 NOTE — ED Notes (Signed)
Patient complaining of pain in her abdomen that is chronic.  Patient reports that she takes medication for it at home.  Released held orders for dilaudid and methadone.

## 2012-05-06 NOTE — Progress Notes (Signed)
CRITICAL VALUE ALERT  Critical value received:  Troponin = 1.39  Date of notification:  05/06/2012  Time of notification:  0544  Critical value read back:yes  Nurse who received alert:  Ruffin Pyo  MD notified (1st page):  Dr Virgina Organ  Time of first page:  [phone]  MD notified (2nd page):  Time of second page:  Responding MD:  Dr Virgina Organ  Time MD responded:  [phone]

## 2012-05-06 NOTE — Care Management Note (Addendum)
  Page 2 of 2   05/10/2012     3:10:31 PM   CARE MANAGEMENT NOTE 05/10/2012  Patient:  Dorothy [name], Dorothy [name]   Account Number:  [id number]  Date Initiated:  05/06/2012  Documentation initiated by:  Junius Creamer  Subjective/Objective Assessment:   adm w mi     Action/Plan:   lives alone, pcp dr Tenny Craw armour,chart states has aid,hx of [facility]   Anticipated DC Date:     Anticipated DC Plan:        DC Planning Services  CM consult      [employer] Choice  HOME HEALTH  DURABLE MEDICAL EQUIPMENT   Choice offered to / List presented to:  C-1 Patient   DME arranged  Levan Hurst      DME agency  [facility]     [facility] arranged  HH-1 RN  HH-10 DISEASE MANAGEMENT  HH-2 PT  HH-4 NURSE'S AIDE      HH agency  [facility]   Status of service:  Completed, signed off Medicare Important Message given?   (If response is "NO", the following Medicare IM given date fields will be blank) Date Medicare IM given:   Date Additional Medicare IM given:    Discharge Disposition:  HOME W HOME HEALTH SERVICES  Per UR Regulation:  Reviewed for med. necessity/level of care/duration of stay  If discussed at Long Length of Stay Meetings, dates discussed:    Comments:  05-10-12 [address] [name], [place] [phone] Pt will plan for home with [facility] services. CM will need order for HHRN, AIDE and PT/ dme RW. CM did place call to MD to ask for orders. CM did make referral for services. SOC to begin within 24-48 hours post d/c.   11/22 8:53 [name] Dorothy dowell rn,bsn [phone] spoke w pt. she [facility] and [facility] w transp. she has w/c and commode extender. she needs rw and tub seat. left message w [facility] to see if she can get aid and hhc again after disch.

## 2012-05-06 NOTE — ED Notes (Signed)
Resting quietly on stretcher - pt currently pain free; skin warm, dry; caox4; ccm showing SR with frequent multifocal PVCs and BBB

## 2012-05-06 NOTE — Consult Note (Addendum)
Reason for Consult: Unstable angina  Requesting Physician: [facility]  HPI: This is a 61 y.o. 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 She has multiple medical problems including prior CABG X 4 Feb '08 with occlusion of her LIMA graft and SVG-Dx graft in June of '08. She was Rx'd with a native LAD stent. She has not been troubled by angina since and has not had any further cardiac studies besides echos. This past year she has had several admissions for a number of problems including recurrent pancreatitis with pancreatic pseudocyst, esophageal varices ([facility] June 2013) and CHF. Yesterday she was at dialysis and developed Rt thumb pain which she says is similar to her pre-CABG symptoms. She was given a fluid bolus and improved but her pain came back last night and awakened her from sleep.She took 2 NTG at home without relief and came to the ER. Her Troponin was initially normal but is now up to 1.39. She is currently pain free on Heparin and NTG. She has requested to change her cardiology care to our group.  PMHx:  Past Medical History  Diagnosis Date  . Gallstone pancreatitis   . Coronary artery disease     s/p CABG 2008 with multiple PCIs  . Ischemic cardiomyopathy   . End stage renal disease   . Diabetes mellitus   . Hypertension   . History of nonadherence to medical treatment   . Hyperlipidemia   . Renal insufficiency   . CHF (congestive heart failure)   . Dialysis patient   . Anemia   . Pneumonia 02/2012   Past Surgical History  Procedure Date  . Coronary artery bypass graft 2008  . Abdominal hysterectomy   . Tubal ligation   . Tonsillectomy   . Insert / replace / remove pacemaker     pacemaker ICD  . Esophagogastroduodenoscopy 09/15/2011    Procedure: ESOPHAGOGASTRODUODENOSCOPY (EGD);  Surgeon: Theda Belfast, MD;  Location: [facility] ENDOSCOPY;  Service: Endoscopy;  Laterality: N/A;  . Cholecystectomy 2003    FAMHx: Family History  Problem Relation  Age of Onset  . Coronary artery disease Mother   . Hypertension Mother   . Diabetes Mother   . Diabetes Sister   . Anesthesia problems Neg Hx     SOCHx:  reports that she quit smoking about 11 years ago. She does not have any smokeless tobacco history on file. She reports that she does not drink alcohol or use illicit drugs.  ALLERGIES: Allergies  Allergen Reactions  . Morphine And Related Anaphylaxis  . Ace Inhibitors Other (See Comments)    Unknown reaction but her physician stated that she can't take it  . Codeine Itching  . Lisinopril Other (See Comments)    Unknown reaction but her physician stated that she can't take it  . Tylenol (Acetaminophen) Other (See Comments)    Patient states that the doctor told her she has a Fatty Liver.    ROS: Pertinent items are noted in HPI.  HOME MEDICATIONS: Prescriptions prior to admission  Medication Sig Dispense Refill  . carvedilol (COREG) 3.125 MG tablet Take 1 tablet (3.125 mg total) by mouth 2 (two) times daily with a meal.  60 tablet  0  . ciprofloxacin (CIPRO) 500 MG tablet Take 500 mg by mouth daily.      . darbepoetin (ARANESP) 100 MCG/0.5ML SOLN Inject 0.5 mLs (100 mcg total) into the vein every Monday with hemodialysis.  4.2 mL    . HYDROmorphone (  DILAUDID) 2 MG tablet Take 1 tablet (2 mg total) by mouth 3 (three) times daily. Takes along with methadone.  21 tablet  0  . Magnesium Oxide 420 MG TABS Take 1 tablet by mouth daily.      . methadone (DOLOPHINE) 5 MG tablet Take 1 tablet (5 mg total) by mouth 3 (three) times daily. Takes along with Dilaudid.  21 tablet  0  . midodrine (PROAMATINE) 5 MG tablet Take 5 mg by mouth 2 (two) times daily.      . multivitamin (RENA-VIT) TABS tablet Take 1 tablet by mouth daily.      . Nutritional Supplements (FEEDING SUPPLEMENT, NEPRO CARB STEADY,) LIQD Take 237 mLs by mouth 2 (two) times daily with breakfast and lunch.      . pantoprazole (PROTONIX) 40 MG tablet Take 40 mg by mouth daily.       . pravastatin (PRAVACHOL) 20 MG tablet Take 20 mg by mouth at bedtime.       . ranolazine (RANEXA) 500 MG 12 hr tablet Take 500 mg by mouth daily.       [name] senna (SENOKOT) 8.6 MG TABS Take 1 tablet by mouth 2 (two) times daily.      . sevelamer (RENAGEL) 400 MG tablet Take 400 mg by mouth daily.        HOSPITAL MEDICATIONS: I have reviewed the patient's current medications.  VITALS: Blood pressure 114/61, pulse 59, temperature 97.6 F (36.4 C), temperature source Oral, resp. rate 18, height 5\' 4"  (1.626 m), weight 54 kg (119 lb 0.8 oz), SpO2 97.00%.  PHYSICAL EXAM: General appearance: alert, cooperative, cachectic and no distress Neck: no JVD and Lt carotid bruit Lungs: decreased breath sounds and dullness to percusion 1/2 up on Rt Heart: regular rate and rhythm and 2/6 sytolic murmur LSB and AOV area Abdomen: not distended, bowel sounds present, no bruits Extremities: AVF LUE with to and fro bruit Pulses: diminnished LE pulses, no FA bruit heard Skin: Skin color, texture, turgor normal. No rashes or lesions Neurologic: Grossly normal  LABS: Results for orders placed during the hospital encounter of 05/05/12 (from the past 48 hour(s))  CBC     Status: Abnormal   Collection Time   05/05/12  8:58 PM      Component Value Range Comment   WBC 9.1  4.0 - 10.5 K/uL WHITE COUNT CONFIRMED ON SMEAR   RBC 3.15 (*) 3.87 - 5.11 MIL/uL    Hemoglobin 8.6 (*) 12.0 - 15.0 g/dL    HCT 16.1 (*) 09.6 - 46.0 %    MCV 91.1  78.0 - 100.0 fL    MCH 27.3  26.0 - 34.0 pg    MCHC 30.0  30.0 - 36.0 g/dL    RDW 04.5 (*) 40.9 - 15.5 %    Platelets 113 (*) 150 - 400 K/uL PLATELET COUNT CONFIRMED BY SMEAR  BASIC METABOLIC PANEL     Status: Abnormal   Collection Time   05/05/12  8:58 PM      Component Value Range Comment   Sodium 134 (*) 135 - 145 mEq/L    Potassium 2.8 (*) 3.5 - 5.1 mEq/L    Chloride 95 (*) 96 - 112 mEq/L    CO2 30  19 - 32 mEq/L    Glucose, Bld 160 (*) 70 - 99 mg/dL    BUN 9  6  - 23 mg/dL    Creatinine, Ser 8.11 (*) 0.50 - 1.10 mg/dL    Calcium 8.8  8.4 - 10.5 mg/dL    GFR calc non Af Amer 18 (*) >90 mL/min    GFR calc Af Amer 21 (*) >90 mL/min   POCT I-STAT TROPONIN I     Status: Normal   Collection Time   05/05/12  9:28 PM      Component Value Range Comment   Troponin i, poc 0.08  0.00 - 0.08 ng/mL    Comment NOTIFIED PHYSICIAN      Comment 3            POCT I-STAT TROPONIN I     Status: Abnormal   Collection Time   05/06/12  1:12 AM      Component Value Range Comment   Troponin i, poc 0.42 (*) 0.00 - 0.08 ng/mL    Comment NOTIFIED PHYSICIAN      Comment 3            TROPONIN I     Status: Abnormal   Collection Time   05/06/12  4:39 AM      Component Value Range Comment   Troponin I 1.39 (*) <0.30 ng/mL   CBC     Status: Abnormal   Collection Time   05/06/12  4:39 AM      Component Value Range Comment   WBC 6.8  4.0 - 10.5 K/uL    RBC 2.96 (*) 3.87 - 5.11 MIL/uL    Hemoglobin 8.1 (*) 12.0 - 15.0 g/dL    HCT 40.9 (*) 81.1 - 46.0 %    MCV 92.6  78.0 - 100.0 fL    MCH 27.4  26.0 - 34.0 pg    MCHC 29.6 (*) 30.0 - 36.0 g/dL    RDW 91.4 (*) 78.2 - 15.5 %    Platelets 107 (*) 150 - 400 K/uL CONSISTENT WITH PREVIOUS RESULT  COMPREHENSIVE METABOLIC PANEL     Status: Abnormal   Collection Time   05/06/12  4:39 AM      Component Value Range Comment   Sodium 133 (*) 135 - 145 mEq/L    Potassium 3.3 (*) 3.5 - 5.1 mEq/L    Chloride 95 (*) 96 - 112 mEq/L    CO2 31  19 - 32 mEq/L    Glucose, Bld 113 (*) 70 - 99 mg/dL    BUN 10  6 - 23 mg/dL    Creatinine, Ser 9.56 (*) 0.50 - 1.10 mg/dL    Calcium 9.2  8.4 - 21.3 mg/dL    Total Protein 6.2  6.0 - 8.3 g/dL    Albumin 2.2 (*) 3.5 - 5.2 g/dL    AST 17  0 - 37 U/L    ALT 5  0 - 35 U/L    Alkaline Phosphatase 72  39 - 117 U/L    Total Bilirubin 0.3  0.3 - 1.2 mg/dL    GFR calc non Af Amer 15 (*) >90 mL/min    GFR calc Af Amer 18 (*) >90 mL/min   LIPID PANEL     Status: Normal   Collection Time    05/06/12  4:39 AM      Component Value Range Comment   Cholesterol 91  0 - 200 mg/dL    Triglycerides 44  <086 mg/dL    HDL 46  >57 mg/dL    Total CHOL/HDL Ratio 2.0      VLDL 9  0 - 40 mg/dL    LDL Cholesterol 36  0 - 99 mg/dL   MAGNESIUM  Status: Normal   Collection Time   05/06/12  4:39 AM      Component Value Range Comment   Magnesium 2.3  1.5 - 2.5 mg/dL   PHOSPHORUS     Status: Normal   Collection Time   05/06/12  4:39 AM      Component Value Range Comment   Phosphorus 3.4  2.3 - 4.6 mg/dL     IMAGING: Dg Chest Port 1 View  05/05/2012  *RADIOLOGY REPORT*  Clinical Data:  Chest pain  PORTABLE CHEST - 1 VIEW  Comparison: Portable exam 2120 hours compared to 04/21/2012  Findings: Right jugular dual-lumen central venous catheter tip projecting over right atrium. Left subclavian transvenous pacemaker / AICD leads project over right atrium, right ventricle, and coronary sinus. Enlargement of cardiac silhouette post CABG. Pulmonary vascular ingestion. Mild perihilar infiltrates suspect edema. No gross pleural effusion or pneumothorax.  IMPRESSION: Enlargement of cardiac silhouette with pulmonary vascular congestion post CABG and pacemaker / AICD. Probable perihilar edema.   Original Report Authenticated By: Ulyses Southward, M.D.    EKG- A-paced  IMPRESSION: Principal Problem:  *NSTEMI (non-ST elevated myocardial infarction) Active Problems:  CAD, CABG X 4 Feb '08, LAD stent June '08  Ischemic cardiomyopathy, EF 20-25% echo April 2013  Chronic systolic congestive heart failure, NYHA class 3  Anemia in chronic kidney disease (CKD)  ESRD on hemodialysis  NSVT 8-10bts 05/06/12  ICD (implantable cardiac defibrillator) in place, MDT placed Jan 2013 [facility]  LBBB (left bundle branch block)  Hypertension  Pancreatic pseudocyst  Pancreatitis, chronic  Esophageal varices in cirrhosis, admitted June 2013 to [facility]   RECOMMENDATION: Dr Tresa Endo to see, she will probably require cath today.  I will ask MDT rep to check her device.   Time Spent Directly with Patient:  50 minutes  KILROY,LUKE K 05/06/2012, 8:43 AM    Patient seen and examined. History reviewed. Multiple medical problems as outlined above. Troponin further increased today c/w NSTEMI. Agree with assessment and plan.  Discussed cath and possible PCI if needed with patient.  Due to h/o esophageal varices if stent needed will place BMS rather than DES. Also with cirrhosis and ESRD on dialysis agree with DC ranolazine from pre-admission meds.   Lennette Bihari, MD, [facility] 05/06/2012 9:11 AM

## 2012-05-06 NOTE — Progress Notes (Deleted)
ANTICOAGULATION CONSULT NOTE - Initial Consult  Pharmacy Consult for heparin Indication: chest pain/ACS  Allergies  Allergen Reactions  . Morphine And Related Anaphylaxis  . Ace Inhibitors Other (See Comments)    Unknown reaction but her physician stated that she can't take it  . Codeine Itching  . Lisinopril Other (See Comments)    Unknown reaction but her physician stated that she can't take it  . Tylenol (Acetaminophen) Other (See Comments)    Patient states that the doctor told her she has a Fatty Liver.    Patient Measurements: Height: 5\' 4"  (162.6 cm) Weight: 119 lb 0.8 oz (54 kg) IBW/kg (Calculated) : 54.7   Vital Signs: Temp: 97.6 F (36.4 C) (11/22 0738) Temp src: Oral (11/22 0738) BP: 125/67 mmHg (11/22 0934) Pulse Rate: 68  (11/22 0934)  Labs:  Basename 05/06/12 0838 05/06/12 0439 05/05/12 2058  HGB 8.4* 8.1* --  HCT 28.5* 27.4* 28.7*  PLT 100* 107* 113*  APTT -- -- --  LABPROT -- -- --  INR -- -- --  HEPARINUNFRC 0.13* -- --  CREATININE -- 3.03* 2.62*  CKTOTAL -- -- --  CKMB -- -- --  TROPONINI -- 1.39* --    Estimated Creatinine Clearance: 16.4 ml/min (by C-G formula based on Cr of 3.03).   Medical History: Past Medical History  Diagnosis Date  . Gallstone pancreatitis   . Coronary artery disease     s/p CABG 2008 with multiple PCIs  . Ischemic cardiomyopathy   . End stage renal disease   . Diabetes mellitus   . Hypertension   . History of nonadherence to medical treatment   . Hyperlipidemia   . Renal insufficiency   . CHF (congestive heart failure)   . Dialysis patient   . Anemia   . Pneumonia 02/2012    Assessment: 61yo female with ESRD and h/o CAD w/ CABG in 2008, now with chest pain on IV heparin. Troponin further elevated, plan for cath with possible PCI today. Heparin level (0.13) is subtherapeutic. Hgb and plts are low, but stable from yesterday   Goal of Therapy:  Heparin level 0.3-0.7 units/ml Monitor platelets by  anticoagulation protocol: Yes   Plan:  - Heparin bolus of 1500 units x1  - Increase heparin rate to 900 units/hr - 8 hr heparin level at 1600, or f/u after cath  Bayard Hugger, PharmD, BCPS  Clinical Pharmacist  Pager: [phone]  05/06/2012,9:49 AM

## 2012-05-06 NOTE — Consult Note (Signed)
[facility] Renal Consultation Note    Indication for Consultation:  Management of ESRD/hemodialysis; anemia, hypertension/volume and secondary hyperparathyroidism  HPI: [name] is a 61 y.o. female with ESRD thought secondary to DM and HTN on HD since February 2012 previously followed by Dr. Rayna Sexton prior to transferring to the [facility] outpt dialysis 05/03/2012.  We have however followed her in the past during [facility] hospitalizations.   [name] presented to the ED this am after an episode of recurrent CPand pain radiating into right thumb not relieved with 2 SL NTG.  She also had an episode on dialysis yesterday, but there is no documentation related to this.  She did have an episode where she felt like she had to vomit; BP was in the low 120s systolic with a documented HR in the 140s over an 8 minute period. NS was given. The pt told the tech she felt bad.  The pt was asked to recline in her chair, but refused.  Symptoms documented as resolved 1628 with BP 121/79 and HR 80 was treated with a bolus of saline..  It is not clear when they actually went away. Her net UF was 1.2 with a post wt of 53.9 (EDW 53.5).  Initial troponin upon arrival was 1.39, down to 1.36, but up from <0.3 2 weeks ago. She said she hadn't taken her coreg for the past several days, because she was unable to open the bottle.  She sleeps propped up, but denies orthopnea per se. She ambulates without DOE; had an episode over diarrhea recently, makes urine.  Her left hand swelling is improving.  There is no CP now. She is hungry at present. She wants to talk more with her cardiologist, but tells me no intervention was necessary during heart cath.  Past Medical History  Diagnosis Date  . Gallstone pancreatitis   . Coronary artery disease     s/p CABG 2008 with multiple PCIs  . Ischemic cardiomyopathy   . End stage renal disease   . Diabetes mellitus   . Hypertension   . History of nonadherence to medical  treatment   . Hyperlipidemia   . Renal insufficiency   . CHF (congestive heart failure)   . Dialysis patient   . Anemia   . Pneumonia 02/2012   Past Surgical History  Procedure Date  . Coronary artery bypass graft 2008  . Abdominal hysterectomy   . Tubal ligation   . Tonsillectomy   . Insert / replace / remove pacemaker     pacemaker ICD  . Esophagogastroduodenoscopy 09/15/2011    Procedure: ESOPHAGOGASTRODUODENOSCOPY (EGD);  Surgeon: Theda Belfast, MD;  Location: [facility] ENDOSCOPY;  Service: Endoscopy;  Laterality: N/A;  . Cholecystectomy 2003   Family History  Problem Relation Age of Onset  . Coronary artery disease Mother   . Hypertension Mother   . Diabetes Mother   . Diabetes Sister   . 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  reports that she quit smoking about 11 years ago. She does not have any smokeless tobacco history on file. She reports that she does not drink alcohol or use illicit drugs. Allergies  Allergen Reactions  . Morphine And Related Anaphylaxis  . Ace Inhibitors Other (See Comments)    Unknown reaction but her physician stated that she can't take it  . Codeine Itching  . Lisinopril Other (See Comments)    Unknown reaction but her physician stated that she can't  take it  . Tylenol (Acetaminophen) Other (See Comments)    Patient states that the doctor told her she has a Fatty Liver.   Prior to Admission medications   Medication Sig Start Date End Date Taking? Authorizing Provider  carvedilol (COREG) 3.125 MG tablet Take 1 tablet (3.125 mg total) by mouth 2 (two) times daily with a meal. 04/05/12  Yes Joseph Art, DO  ciprofloxacin (CIPRO) 500 MG tablet Take 500 mg by mouth daily.   Yes Historical Provider, MD  darbepoetin (ARANESP) 100 MCG/0.5ML SOLN Inject 0.5 mLs (100 mcg total) into the vein every Monday with hemodialysis. 04/05/12  Yes Joseph Art, DO  HYDROmorphone (DILAUDID) 2 MG tablet Take 1 tablet (2 mg total) by  mouth 3 (three) times daily. Takes along with methadone. 03/04/12  Yes [name], MD  Magnesium Oxide 420 MG TABS Take 1 tablet by mouth daily.   Yes Historical Provider, MD  methadone (DOLOPHINE) 5 MG tablet Take 1 tablet (5 mg total) by mouth 3 (three) times daily. Takes along with Dilaudid. 03/04/12  Yes Srikar Cherlynn Kaiser, MD  midodrine (PROAMATINE) 5 MG tablet Take 5 mg by mouth 2 (two) times daily.   Yes Historical Provider, MD  multivitamin (RENA-VIT) TABS tablet Take 1 tablet by mouth daily. 10/14/11  Yes Clanford Cyndie Mull, MD  Nutritional Supplements (FEEDING SUPPLEMENT, NEPRO CARB STEADY,) LIQD Take 237 mLs by mouth 2 (two) times daily with breakfast and lunch. 03/04/12  Yes Srikar Cherlynn Kaiser, MD  pantoprazole (PROTONIX) 40 MG tablet Take 40 mg by mouth daily.   Yes Historical Provider, MD  pravastatin (PRAVACHOL) 20 MG tablet Take 20 mg by mouth at bedtime.    Yes Historical Provider, MD  ranolazine (RANEXA) 500 MG 12 hr tablet Take 500 mg by mouth daily.    Yes Historical Provider, MD  senna (SENOKOT) 8.6 MG TABS Take 1 tablet by mouth 2 (two) times daily.   Yes Historical Provider, MD  sevelamer (RENAGEL) 400 MG tablet Take 400 mg by mouth daily.   Yes Historical Provider, MD   Current Facility-Administered Medications  Medication Dose Route Frequency Provider Last Rate Last Dose  . [COMPLETED] 0.9 %  sodium chloride infusion   Intravenous Once Hannah Muthersbaugh, PA-C      . 0.9 %  sodium chloride infusion   Intravenous Continuous PRN Jonah Blue, DO 10 mL/hr at 05/06/12 0406    . [COMPLETED] aspirin EC tablet 325 mg  325 mg Oral Once [employer], PA-C   325 mg at 05/05/12 2245  . aspirin EC tablet 325 mg  325 mg Oral Daily Darden Palmer, MD   325 mg at 05/06/12 0935  . atorvastatin (LIPITOR) tablet 40 mg  40 mg Oral q1800 Larey Seat, MD      . carvedilol (COREG) tablet 3.125 mg  3.125 mg Oral BID WC Darden Palmer, MD   3.125 mg at 05/06/12 0934  . ciprofloxacin (CIPRO)  tablet 500 mg  500 mg Oral Daily Darden Palmer, MD   500 mg at 05/06/12 0935  . feeding supplement (NEPRO CARB STEADY) liquid 237 mL  237 mL Oral BID WC Darden Palmer, MD      . heparin ADULT infusion 100 units/mL (25000 units/250 mL)  850 Units/hr Intravenous Continuous Lauren Bajbus, PHARMD 8.5 mL/hr at 05/06/12 0956 850 Units/hr at 05/06/12 0956  . [COMPLETED] heparin bolus via infusion 1,500 Units  1,500 Units Intravenous Once Lauren Bajbus, PHARMD   1,500 Units at 05/06/12 1015  . [  COMPLETED] heparin bolus via infusion 3,000 Units  3,000 Units Intravenous Once Colleen Can, PHARMD   3,000 Units at 05/06/12 0134  . HYDROmorphone (DILAUDID) injection 0.5 mg  0.5 mg Intravenous Q4H PRN Darden Palmer, MD   0.5 mg at 05/06/12 0600  . [COMPLETED] HYDROmorphone (DILAUDID) injection 1 mg  1 mg Intravenous Once Hannah Muthersbaugh, PA-C   1 mg at 05/05/12 2229  . insulin aspart (novoLOG) injection 0-9 Units  0-9 Units Subcutaneous TID WC Darden Palmer, MD      . methadone (DOLOPHINE) tablet 5 mg  5 mg Oral TID Darden Palmer, MD   5 mg at 05/06/12 0933  . multivitamin (RENA-VIT) tablet 1 tablet  1 tablet Oral Daily Darden Palmer, MD   1 tablet at 05/06/12 0935  . nitroGLYCERIN 0.2 mg/mL in dextrose 5 % infusion  2-200 mcg/min Intravenous Titrated Linward Headland, MD 1.5 mL/hr at 05/06/12 0148 5 mcg/min at 05/06/12 0148  . [COMPLETED] ondansetron (ZOFRAN) injection 4 mg  4 mg Intravenous Once Hannah Muthersbaugh, PA-C   4 mg at 05/05/12 2229  . ondansetron (ZOFRAN) injection 4 mg  4 mg Intravenous Q6H PRN Darden Palmer, MD      . pantoprazole (PROTONIX) EC tablet 40 mg  40 mg Oral Daily Darden Palmer, MD   40 mg at 05/06/12 0934  . [COMPLETED] potassium chloride 10 mEq in 100 mL IVPB  10 mEq Intravenous Once [employer], PA-C   10 mEq at 05/06/12 0015  . senna (SENOKOT) tablet 8.6 mg  1 tablet Oral BID Darden Palmer, MD      . sodium chloride 0.9 % injection 3 mL  3 mL Intravenous  Q12H Darden Palmer, MD   3 mL at 05/06/12 0936   Labs: Basic Metabolic Panel:  Lab 05/06/12 1610 05/05/12 2058  NA 133* 134*  K 3.3* 2.8*  CL 95* 95*  CO2 31 30  GLUCOSE 113* 160*  BUN 10 9  CREATININE 3.03* 2.62*  CALCIUM 9.2 8.8  ALB -- --  PHOS 3.4 --   Liver Function Tests:  Lab 05/06/12 0439  AST 17  ALT 5  ALKPHOS 72  BILITOT 0.3  PROT 6.2  ALBUMIN 2.2*  CBC:  Lab 05/06/12 0838 05/06/12 0439 05/05/12 2058  WBC 8.0 6.8 9.1  NEUTROABS -- -- --  HGB 8.4* 8.1* 8.6*  HCT 28.5* 27.4* 28.7*  MCV 91.6 92.6 91.1  PLT 100* 107* 113*   Cardiac Enzymes:  Lab 05/06/12 0833 05/06/12 0439  CKTOTAL -- --  CKMB -- --  CKMBINDEX -- --  TROPONINI 1.36* 1.39*   CBG:  Lab 05/06/12 1205 05/06/12 0740  GLUCAP 86 88   Lab Results  Component Value Date   INR 1.42 05/06/2012  Studies/Results: Dg Chest Port 1 View  05/05/2012  *RADIOLOGY REPORT*  Clinical Data:  Chest pain  PORTABLE CHEST - 1 VIEW  Comparison: Portable exam 2120 hours compared to 04/21/2012  Findings: Right jugular dual-lumen central venous catheter tip projecting over right atrium. Left subclavian transvenous pacemaker / AICD leads project over right atrium, right ventricle, and coronary sinus. Enlargement of cardiac silhouette post CABG. Pulmonary vascular ingestion. Mild perihilar infiltrates suspect edema. No gross pleural effusion or pneumothorax.  IMPRESSION: Enlargement of cardiac silhouette with pulmonary vascular congestion post CABG and pacemaker / AICD. Probable perihilar edema.   Original Report Authenticated By: Ulyses Southward, M.D.    ROS: As per HPI, otherwise neg.  Physical Exam Filed Vitals:   05/06/12 0800 05/06/12 0900  05/06/12 0934 05/06/12 1205  BP: 114/61 125/67 125/67 116/64  Pulse: 59 61 68 66  Temp:    97.4 F (36.3 C)  TempSrc:    Oral  Resp: 18 12    Height:      Weight:      SpO2: 97% 96%  98%   General: Talkative, slender sitting in bed post heart cath;  in no acute  distress. Head: Normocephalic, atraumatic, sclera non-icteric,wearing glasses,  mucus membranes are moist (dentures at home) Neck: Supple. Prominent neck veins; right I-J catheter Lungs: bibasilar crackles R>L Heart: RRR with S1 S2. 2/6 murmur Abdomen: Soft, non-tender, non-distended with normoactive bowel sounds. No rebound/guarding. No obvious abdominal masses. Midline scar Lower extremities: tr edema or ischemic changes, no open wounds  Neuro: Alert and oriented X 3. Moves all extremities spontaneously. Psych:  Responds to questions appropriately with a normal affect. Dialysis Access: right I-J active; maturing left lower arm AVGG swelling improving-very little edema in fingers, but still in full arm.  Dialysis Orders: Center: [facility]  On TTS- transferred there 05/03/2012; EDW 53.5 HD Bath 2K 2.25 Ca Optiflux 180  Time 4 Heparin 1600. Access left forearm AVGG placed 10/25 Dr. Hollace Hayward - not used yet; right I-J active BFR 400 DFR 800 Zemplar none IV/HD Epogen 15,000   Units IV/HD  No IV Fe  Assessment/Plan: 1. NSTEMI with hx CABG 2008 with multiple PCIs - work up per cards. Heart cath report pending 2. ESRD  TTS [facility] with post dialysis hypokalemia upon arrival yesterday- received small dose KCl; she has been on a 2 K bath and obviously not eating enough to warrant this.  Will use 4 K here and will need 3 or 4 K bath at discharge.; titrate EDW as tolerated due to edema. Access swelling likely due to central stenosis somewhere.  Has left sided ICD- tells me her appt to f/u with her vascular surgeon is Monday the 25th 3. Hypertension/volume  - BP controlled; titrating EDW; CXR cites pul vasc congestion; large IDWG.  Attempt to lower EDW while here. 4. Anemia  - Hgb 8.4. Had Fe studies drawn Thursday at her HD centers.  Hgb down from 9.9 11/7, but was 8 at discharge 11/10; will dose Aranesp at 150; If Hgb continues to decline will transfuse on HD Saturday. 5. Metabolic bone disease -  iTPH drawn 11/21; not  on Zemplar and no iPTH levels available from her prior HD center; resume renvela 800 1 ac and check P with pre HD labs. 6. Nutrition - add nepro supplements; needs to be started on oral nutritional supplmenet program (ONSP) at hr HD center after discharge. 7. Hyperlipidemia - on statin 8. DM - no meds needed at this time. 9. CODE STATUS - Dr Eliott Nine signed DNR papers at her dialysis center yesterday after discussion with patient.  I have readdressed today. She wishes to continue DNR  Status in the hospital.  Bard Herbert, PA-C [facility] [phone] 05/06/2012, 12:41 PM   Patient seen and examined and agree with assessment and plan as above.  Vinson Moselle  MD [facility] [phone] pgr    [phone] cell 05/06/2012, 9:45 PM

## 2012-05-06 NOTE — Progress Notes (Signed)
Patient refuses SCDs and TED hose-says she has her own and if she wants to wear them, she will ask.

## 2012-05-06 NOTE — Progress Notes (Signed)
Subjective: [name] is feeling much better this morning. Her thumb is hurting a little bit but much better than yesterday. She states that her thumb hurt when she came in before her CABG procedure.   Denies chest pain, SOB.  Objective: Vital signs in last 24 hours: Filed Vitals:   05/06/12 0500 05/06/12 0600 05/06/12 0700 05/06/12 0738  BP: 109/66 115/64 120/70   Pulse: 60 60 61   Temp:    97.6 F (36.4 C)  TempSrc:    Oral  Resp: 12 15 13    Height:      Weight:      SpO2: 100% 97% 96%    Weight change:   Intake/Output Summary (Last 24 hours) at 05/06/12 0740 Last data filed at 05/06/12 0600  Gross per 24 hour  Intake  56.57 ml  Output      0 ml  Net  56.57 ml    Physical Exam Blood pressure 120/70, pulse 61, temperature 97.6 F (36.4 C), temperature source Oral, resp. rate 13, height 5\' 4"  (1.626 m), weight 119 lb 0.8 oz (54 kg), SpO2 96.00%. General:  No acute distress, alert and oriented x 3, well-appearing  HEENT:  PERRL, EOMI, moist mucous membranes Cardiovascular:  Regular rate and rhythm, systolic murmur LSB Chest: HD cath in place Respiratory:  Crackles on the right. Otherwise clear, no rhonchi. Abdomen:  Soft, nondistended, nontender, bowel sounds present Extremities:  Warm and well-perfused, no LE edema.  LUE with AVF, nonpitting edema throughout whole extremity. RUE wnl. Skin: Warm, dry, no rashes Neuro: Not anxious appearing, no depressed mood, normal affect  Lab Results: Basic Metabolic Panel:  Lab 05/06/12 4782 05/05/12 2058  NA 133* 134*  K 3.3* 2.8*  CL 95* 95*  CO2 31 30  GLUCOSE 113* 160*  BUN 10 9  CREATININE 3.03* 2.62*  CALCIUM 9.2 8.8  MG 2.3 --  PHOS 3.4 --   Liver Function Tests:  Lab 05/06/12 0439  AST 17  ALT 5  ALKPHOS 72  BILITOT 0.3  PROT 6.2  ALBUMIN 2.2*   CBC:  Lab 05/06/12 0439 05/05/12 2058  WBC 6.8 9.1  NEUTROABS -- --  HGB 8.1* 8.6*  HCT 27.4* 28.7*  MCV 92.6 91.1  PLT 107* 113*   Cardiac  Enzymes:  Lab 05/06/12 0439  CKTOTAL --  CKMB --  CKMBINDEX --  TROPONINI 1.39*   Fasting Lipid Panel:  Lab 05/06/12 0439  CHOL 91  HDL 46  LDLCALC 36  TRIG 44  CHOLHDL 2.0  LDLDIRECT --     Studies/Results: Dg Chest Port 1 View  05/05/2012  *RADIOLOGY REPORT*  Clinical Data:  Chest pain  PORTABLE CHEST - 1 VIEW  Comparison: Portable exam 2120 hours compared to 04/21/2012  Findings: Right jugular dual-lumen central venous catheter tip projecting over right atrium. Left subclavian transvenous pacemaker / AICD leads project over right atrium, right ventricle, and coronary sinus. Enlargement of cardiac silhouette post CABG. Pulmonary vascular ingestion. Mild perihilar infiltrates suspect edema. No gross pleural effusion or pneumothorax.  IMPRESSION: Enlargement of cardiac silhouette with pulmonary vascular congestion post CABG and pacemaker / AICD. Probable perihilar edema.   Original Report Authenticated By: Ulyses Southward, M.D.    Medications: I have reviewed the patient's current medications. Scheduled Meds:   . [COMPLETED] sodium chloride   Intravenous Once  . [COMPLETED] aspirin EC  325 mg Oral Once  . aspirin EC  325 mg Oral Daily  . atorvastatin  80 mg Oral q1800  .  carvedilol  3.125 mg Oral BID WC  . ciprofloxacin  500 mg Oral Daily  . feeding supplement (NEPRO CARB STEADY)  237 mL Oral BID WC  . [COMPLETED] heparin  3,000 Units Intravenous Once  . [COMPLETED]  HYDROmorphone (DILAUDID) injection  1 mg Intravenous Once  . insulin aspart  0-9 Units Subcutaneous TID WC  . methadone  5 mg Oral TID  . multivitamin  1 tablet Oral Daily  . [COMPLETED] ondansetron (ZOFRAN) IV  4 mg Intravenous Once  . pantoprazole  40 mg Oral Daily  . [COMPLETED] potassium chloride  10 mEq Intravenous Once  . potassium chloride  40 mEq Oral Q4H  . senna  1 tablet Oral BID  . sodium chloride  3 mL Intravenous Q12H  . [DISCONTINUED] aspirin EC  325 mg Oral Daily  . [DISCONTINUED] aspirin  325 mg  Oral Daily  . [DISCONTINUED] morphine  4 mg Intravenous Once   Continuous Infusions:   . sodium chloride 10 mL/hr at 05/06/12 0406  . heparin 700 Units/hr (05/06/12 0253)  . nitroGLYCERIN 5 mcg/min (05/06/12 0148)   PRN Meds:.sodium chloride, HYDROmorphone (DILAUDID) injection, ondansetron (ZOFRAN) IV  Assessment/Plan: [name] is a 61 y.o. year old female   Assessment & Plan by Problem:  [name] is a 61 year old female with PMH of ESRD on HD, chronic systolic CHF (Echo 09/2011 EF 20-25%), s/p CABG 2008, HTN, DMII admitted for chest pain.    NSTEMI Patient has a hx of Chronic systolic CHF with Echo in 09/2011 EF 20-25%, s/p CABG 2008, pacemaker in place. Troponins trending up: 0.08-->0.42-->1.3. EKG with no obvious signs of ST changes on EKG and patient has ESRD. TIMI score 4 risk 20%. Could also be secondary to musculoskeletal given right sided distribution of pain, however, sudden onset with strong cardiac history makes ACS more likely. CXR on admission: enlargement of cardiac silhouette with pulmonary vascular congestion, probable perihilar edema, no gross pleural effusion or pneumothorax making PNA or pneumothorax less likely. Cardiology consulted-Dr. Herbie Baltimore, will continue to follow, possible Cath in AM.   *Cath today -ASA, coreg, statin -IV Nitroglycerin, keep SBP>100  -IV Heparin  -O2 Spreckels PRN, keep o2 sat >92%  -risk stratification: HbA1c, TSH, lipid panel   -Dilaudid PRN for pain  -Protonix  -Zofran PRN   ESRD on hemodialysis T, Th, Sat. Had HD on 05/05/12. Followed by [facility]. Cr 2.62 on admission. GFR: 21.   -continue to monitor  -f/u renal recs  Chronic systolic congestive heart failure, NYHA class 3- EF 20-25%--Echo in 09/2011 EF 20-25%, s/p CABG 2008 with Cath 11/2006--3 vessel disease including stenosis of LAD-LIMA (stented), stenosis of SVG to diagonal (not stented), pacemaker in place. Followed at [facility] where pacemaker was placed. On home regimen:  coreg 3.125mg  BID, Ranexa 500mg  qd.  -Cardiology following -Cath in AM  -continue coreg  -ASA   Hypertension--BP on admission: 108/69. Noted to be hypotensive during HD. Is on Midodrine 5mg  BID and Coreg 3.125mg  BID at home  -hold midodrine  -continue coreg  -continue to monitor   Anemia in chronic kidney disease (CKD)--ESRD on HD. Hb 8.6 on admission, baseline ~7.  -daily CBC  -continue to monitor   Diabetes Mellitus II--well controlled, ESRD on HD. Last HbA1c: 5.7 09/2011. On no diabetic medication at home.   -CBG monitoring  -SSI sensitive  -continue to monitor   Diet: NPO for cath -may eat afterwards  DVT Ppx: on IV Heparin   Dispo: Disposition is deferred  at this time, awaiting improvement of current medical problems. Anticipated discharge in approximately 2-3 day(s).   -The patient does have a current PCP (ARMOUR, ROSS B, MD), therefore will not be requiring OPC follow-up after discharge.  -The patient does not have transportation limitations that hinder transportation to clinic appointments.    LOS: 1 day   [place] Ar 05/06/2012, 7:40 AM

## 2012-05-06 NOTE — Progress Notes (Signed)
Unable to tolerate potassium iv inspite ice pack applied to iv site and given slowly . Iv site clean  And no redness noted.MD aware with order. Continue to monitor.

## 2012-05-06 NOTE — Progress Notes (Signed)
Back from the cath lab awake and alert. Instructions given bedrest emphasized.

## 2012-05-06 NOTE — Progress Notes (Signed)
INITIAL ADULT NUTRITION ASSESSMENT Date: 05/06/2012   Time: 11:26 AM Reason for Assessment: MST (Malnutrition Screening Tool)   INTERVENTION: 1. Continue Nepro BID, provide 850 kcal and 38 gm protein daily  2. RD to add snack and meal preferences to health touch program.  3. If pt continues to fail to meet nutrition needs with PO intake, may benefit from EN for supplemental intake.  4. RD will continue to follow     DOCUMENTATION CODES Per approved criteria  -Non-severe (moderate) malnutrition in the context of chronic illness    ASSESSMENT: [name]  Dx: NSTEMI (non-ST elevated myocardial infarction)  Hx:  Past Medical History  Diagnosis Date  . Gallstone pancreatitis   . Coronary artery disease     s/p CABG 2008 with multiple PCIs  . Ischemic cardiomyopathy   . End stage renal disease   . Diabetes mellitus   . Hypertension   . History of nonadherence to medical treatment   . Hyperlipidemia   . Renal insufficiency   . CHF (congestive heart failure)   . Dialysis patient   . Anemia   . Pneumonia 02/2012    Past Surgical History  Procedure Date  . Coronary artery bypass graft 2008  . Abdominal hysterectomy   . Tubal ligation   . Tonsillectomy   . Insert / replace / remove pacemaker     pacemaker ICD  . Esophagogastroduodenoscopy 09/15/2011    Procedure: ESOPHAGOGASTRODUODENOSCOPY (EGD);  Surgeon: Theda Belfast, MD;  Location: [facility] ENDOSCOPY;  Service: Endoscopy;  Laterality: N/A;  . Cholecystectomy 2003    Related Meds:     . [COMPLETED] sodium chloride   Intravenous Once  . [COMPLETED] aspirin EC  325 mg Oral Once  . aspirin EC  325 mg Oral Daily  . atorvastatin  40 mg Oral q1800  . carvedilol  3.125 mg Oral BID WC  . ciprofloxacin  500 mg Oral Daily  . feeding supplement (NEPRO CARB STEADY)  237 mL Oral BID WC  . heparin  1,500 Units Intravenous Once  . [COMPLETED] heparin  3,000 Units Intravenous Once  . [COMPLETED]  HYDROmorphone (DILAUDID)  injection  1 mg Intravenous Once  . insulin aspart  0-9 Units Subcutaneous TID WC  . methadone  5 mg Oral TID  . multivitamin  1 tablet Oral Daily  . [COMPLETED] ondansetron (ZOFRAN) IV  4 mg Intravenous Once  . pantoprazole  40 mg Oral Daily  . [COMPLETED] potassium chloride  10 mEq Intravenous Once  . senna  1 tablet Oral BID  . sodium chloride  3 mL Intravenous Q12H  . [DISCONTINUED] aspirin EC  325 mg Oral Daily  . [DISCONTINUED] aspirin  325 mg Oral Daily  . [DISCONTINUED] atorvastatin  80 mg Oral q1800  . [DISCONTINUED] heparin  1,500 Units Intravenous Once  . [DISCONTINUED] morphine  4 mg Intravenous Once  . [COMPLETED] potassium chloride  10 mEq Intravenous Once  . [DISCONTINUED] potassium chloride  40 mEq Oral Q4H     Ht: 5\' 4"  (162.6 cm)  Wt: 119 lb 0.8 oz (54 kg)  Ideal Wt: 54.5 kg  % Ideal Wt: 99%  Usual Wt:  Wt Readings from Last 10 Encounters:  05/06/12 119 lb 0.8 oz (54 kg)  04/23/12 119 lb 14.9 oz (54.4 kg)  04/04/12 117 lb 15.1 oz (53.5 kg)  03/05/12 115 lb 8.3 oz (52.4 kg)  02/24/12 130 lb 4.7 oz (59.1 kg)  10/13/11 135 lb 6.4 oz (61.417 kg)  09/17/11 132 lb  7.9 oz (60.1 kg)  09/17/11 132 lb 7.9 oz (60.1 kg)  06/04/11 157 lb 13.6 oz (71.6 kg)  04/08/11 159 lb 6.4 oz (72.303 kg)  ~130 lbs  % Usual Wt: 91%, trending up   Body mass index is 20.43 kg/(m^2). WNL   Food/Nutrition Related Hx: Pt with poor appetite and hx of weight loss   Labs:  CMP     Component Value Date/Time   NA 133* 05/06/2012 0439   K 3.3* 05/06/2012 0439   CL 95* 05/06/2012 0439   CO2 31 05/06/2012 0439   GLUCOSE 113* 05/06/2012 0439   BUN 10 05/06/2012 0439   CREATININE 3.03* 05/06/2012 0439   CALCIUM 9.2 05/06/2012 0439   PROT 6.2 05/06/2012 0439   ALBUMIN 2.2* 05/06/2012 0439   AST 17 05/06/2012 0439   ALT 5 05/06/2012 0439   ALKPHOS 72 05/06/2012 0439   BILITOT 0.3 05/06/2012 0439   GFRNONAA 15* 05/06/2012 0439   GFRAA 18* 05/06/2012 0439   Sodium  Date/Time  Value Range Status  05/06/2012  4:39 AM 133* 135 - 145 mEq/L Final  05/05/2012  8:58 PM 134* 135 - 145 mEq/L Final  04/27/2012  9:55 AM 133* 135 - 145 mEq/L Final    Potassium  Date/Time Value Range Status  05/06/2012  4:39 AM 3.3* 3.5 - 5.1 mEq/L Final  05/05/2012  8:58 PM 2.8* 3.5 - 5.1 mEq/L Final  04/27/2012  9:55 AM 4.8  3.5 - 5.1 mEq/L Final    Phosphorus  Date/Time Value Range Status  05/06/2012  4:39 AM 3.4  2.3 - 4.6 mg/dL Final  04/23/1477 29:56 AM 4.5  2.3 - 4.6 mg/dL Final  21/08/863  7:84 AM 3.4  2.3 - 4.6 mg/dL Final    Magnesium  Date/Time Value Range Status  05/06/2012  4:39 AM 2.3  1.5 - 2.5 mg/dL Final  69/62/9528  4:13 AM 1.8  1.5 - 2.5 mg/dL Final  07/19/4008  2:72 AM 1.7  1.5 - 2.5 mg/dL Final      Intake/Output Summary (Last 24 hours) at 05/06/12 1128 Last data filed at 05/06/12 1000  Gross per 24 hour  Intake 235.07 ml  Output      0 ml  Net 235.07 ml     Diet Order: NPO  Supplements/Tube Feeding: Nepro BID  IVF:    sodium chloride Last Rate: 10 mL/hr at 05/06/12 0406  heparin Last Rate: 700 Units/hr (05/06/12 0253)  nitroGLYCERIN Last Rate: 5 mcg/min (05/06/12 0148)    Estimated Nutritional Needs:   Kcal: 1600-1800  Protein: 75-85 gm  Fluid:  1.2 L   Pt with frequent admission. Admitted this time with chest pain at HD and again after HD. Planned for cath today.  Pt with hx of poor appetite and weight loss. At previous admission pt was dx with moderate malnutrition in the context of chronic illness. This is likely ongoing despite some weight gain.   Pt reports that she still has very little appetite but does drink Nepro shake BID at home. Tries to eat best she can, but most meals are very small. Nepro BID provides 825 kcal and 38 gm protein daily.   RD spoke with pt about meal preferences and snacks, will add to Health Touch. Continue Nepro as ordered.   NUTRITION DIAGNOSIS: Inadequate oral intake related to poor appetite as evidenced by  hx of weight loss and malnutrition.    MONITORING/EVALUATION(Goals): Goal: PO intake to meet >/=90% estimated nutrition needs  Monitor: PO intake, weight,  labs, I/O's  EDUCATION NEEDS: -No education needs identified at this time   Clarene Duke RD, LDN Pager [phone] After Hours pager [phone]  05/06/2012, 11:26 AM

## 2012-05-06 NOTE — Progress Notes (Addendum)
ANTICOAGULATION CONSULT NOTE - Initial Consult  Pharmacy Consult for heparin Indication: chest pain/ACS  Allergies  Allergen Reactions  . Morphine And Related Anaphylaxis  . Ace Inhibitors Other (See Comments)    Unknown reaction but her physician stated that she can't take it  . Codeine Itching  . Lisinopril Other (See Comments)    Unknown reaction but her physician stated that she can't take it  . Tylenol (Acetaminophen) Other (See Comments)    Patient states that the doctor told her she has a Fatty Liver.    Patient Measurements: Height: 5\' 4"  (162.6 cm) Weight: 119 lb 0.8 oz (54 kg) IBW/kg (Calculated) : 54.7 -this is the heparin dosing weight  Vital Signs: Temp: 97.6 F (36.4 C) (11/22 0738) Temp src: Oral (11/22 0738) BP: 125/67 mmHg (11/22 0934) Pulse Rate: 68  (11/22 0934)  Labs:  Basename 05/06/12 0838 05/06/12 0439 05/05/12 2058  HGB 8.4* 8.1* --  HCT 28.5* 27.4* 28.7*  PLT 100* 107* 113*  APTT -- -- --  LABPROT -- -- --  INR -- -- --  HEPARINUNFRC 0.13* -- --  CREATININE -- 3.03* 2.62*  CKTOTAL -- -- --  CKMB -- -- --  TROPONINI -- 1.39* --    Estimated Creatinine Clearance: 16.4 ml/min (by C-G formula based on Cr of 3.03).   Medical History: Past Medical History  Diagnosis Date  . Gallstone pancreatitis   . Coronary artery disease     s/p CABG 2008 with multiple PCIs  . Ischemic cardiomyopathy   . End stage renal disease   . Diabetes mellitus   . Hypertension   . History of nonadherence to medical treatment   . Hyperlipidemia   . Renal insufficiency   . CHF (congestive heart failure)   . Dialysis patient   . Anemia   . Pneumonia 02/2012    Assessment: 61yo female with ESRD and h/o CAD w/ CABG in 2008 c/o CP that began after HD session which subsequently subsided and then returned. Troponins elevated overnight and early this morning. Heparin level was 0.13 at 0930 this morning.  Goal of Therapy:  Heparin level 0.3-0.7 units/ml Monitor  platelets by anticoagulation protocol: Yes   Plan:  1. Give heparin bolus of 1500 units x1  2. Will increase drip to 850 units/hr 3. 8 hour heparin level (1800) 4. Daily CBC, Heparin levels  Cristol Engdahl D. Ashvik Grundman, PharmD Clinical Pharmacist Pager: [phone] Phone: [phone] 05/06/2012 9:56 AM

## 2012-05-06 NOTE — H&P (Signed)
Hospital Admission Note Date: 05/06/2012  Patient name: [name] Medical record number: [id number] Date of birth: 09/22/1950 Age: 61 y.o. Gender: female PCP: Jyl Heinz, MD  Medical Service: Internal Medicine Teaching Service--Lane  Attending physician: Dr. Jonah Blue    1st Contact: Dr. Darnelle Bos 2nd Contact: Dr. Clyde Lundborg    Pager:[phone] After 5 pm or weekends: 1st Contact:      Pager: [phone] 2nd Contact:      Pager: [phone]  Chief Complaint: Chest pain  History of Present Illness:[name] is a 61 year old African American female with PMH of ESRD on HD T,Th,Sat, CHF--chronic systolic (Echo 09/2011 EF 20-25%), s/p CABG 2008 with cath 11/2006--3 vessel disease including stenosis of LAD-LIMA (stented), stenosis of SVG to diagonal (not stented), s/p pacemaker, HTN, DMII who presents to [facility] with complaints of chest pain this morning during HD session and at home around 8pm.  She was recently discharged from the hospital on 04/24/12 after being admitted on 04/21/09 for abdominal pain with unknown etiology and treated for possible UTI vs. SBP and discharged on 7 day course of Ciprofloxacin and Flagyl.     [name] claims the chest pain started suddenly this afternoon around 330pm, felt like pressure, hard to quantify pain, started on right thumb, under right arm pit, and radiated up right arm and across chest but not to left side.  The pain was associated with nausea and feeling hot and cold, and sweating.  The pain lasted for approximately 1.5 hours and improved after receiving fluid bolus during HD.  She then went home after HD and around 8pm at home, the pain returned, took two nitroglycerin tablets, no relief of pain, and she then called EMS.  She was placed on O2 by EMS and pain improved by 845pm and was at the hospital by 9pm.  Ms. Kruszka also endorses having a few more episodes of chest pain in ED since then but is currently free of chest pain at this time.  She continues  to have chronic abdominal pain secondary to her "pancreatitis" and had one episode of vomiting yesterday because she did not eat in time.  She denies any current headaches, shortness of breath, fever, nausea, vomiting, diarrhea, constipation, or any urinary complaints at this time.    Of note, she follows with [facility] and recently has switched to [facility] for HD.  She still makes urine, about 100cc five times a day.  She also claims to be on chronic daily ciprofloxacin and has not completed her full course of antibiotics from her last admission yet.  Her most recent paracentesis was at [facility] approximately 2 weeks prior and she normally goes every 3 weeks for her "fatty liver".  Meds: Current Outpatient Rx  Name  Route  Sig  Dispense  Refill  . CARVEDILOL 3.125 MG PO TABS   Oral   Take 1 tablet (3.125 mg total) by mouth 2 (two) times daily with a meal.   60 tablet   0   . CIPROFLOXACIN HCL 500 MG PO TABS   Oral   Take 500 mg by mouth daily.         [name] DARBEPOETIN ALFA-POLYSORBATE 100 MCG/0.5ML IJ SOLN   Intravenous   Inject 0.5 mLs (100 mcg total) into the vein every Monday with hemodialysis.   4.2 mL      . HYDROMORPHONE HCL 2 MG PO TABS   Oral   Take 1 tablet (2 mg total)  by mouth 3 (three) times daily. Takes along with methadone.   21 tablet   0   . MAGNESIUM OXIDE 420 MG PO TABS   Oral   Take 1 tablet by mouth daily.         [name] METHADONE HCL 5 MG PO TABS   Oral   Take 1 tablet (5 mg total) by mouth 3 (three) times daily. Takes along with Dilaudid.   21 tablet   0   . MIDODRINE HCL 5 MG PO TABS   Oral   Take 5 mg by mouth 2 (two) times daily.         [name] RENA-VITE PO TABS   Oral   Take 1 tablet by mouth daily.         [name] NEPRO/CARB STEADY PO LIQD   Oral   Take 237 mLs by mouth 2 (two) times daily with breakfast and lunch.         [name] PANTOPRAZOLE SODIUM 40 MG PO TBEC   Oral   Take 40 mg by mouth daily.         [name] PRAVASTATIN SODIUM 20 MG PO  TABS   Oral   Take 20 mg by mouth at bedtime.          [name] RANOLAZINE ER 500 MG PO TB12   Oral   Take 500 mg by mouth daily.          . SENNA 8.6 MG PO TABS   Oral   Take 1 tablet by mouth 2 (two) times daily.         [name] SEVELAMER HCL 400 MG PO TABS   Oral   Take 400 mg by mouth daily.          Allergies: Allergies as of 05/05/2012 - Review Complete 05/05/2012  Allergen Reaction Noted  . Morphine and related Anaphylaxis 12/01/2010  . Ace inhibitors Other (See Comments) 04/24/2012  . Codeine Itching 12/01/2010  . Lisinopril Other (See Comments) 12/01/2010  . Tylenol (acetaminophen) Other (See Comments) 04/21/2012   Past Medical History  Diagnosis Date  . Gallstone pancreatitis   . Coronary artery disease     s/p CABG 2008 with multiple PCIs  . Ischemic cardiomyopathy   . End stage renal disease   . Diabetes mellitus   . Hypertension   . History of nonadherence to medical treatment   . LBBB (left bundle branch block)   . Hyperlipidemia   . Renal insufficiency   . CHF (congestive heart failure)   . Dialysis patient   . Anemia    Past Surgical History  Procedure Date  . Coronary artery bypass graft 2008  . Cholecystectomy   . Abdominal hysterectomy   . Tubal ligation   . Tonsillectomy   . Insert / replace / remove pacemaker     pacemaker ICD  . Esophagogastroduodenoscopy 09/15/2011    Procedure: ESOPHAGOGASTRODUODENOSCOPY (EGD);  Surgeon: Theda Belfast, MD;  Location: [facility] ENDOSCOPY;  Service: Endoscopy;  Laterality: N/A;   Family History  Problem Relation Age of Onset  . Coronary artery disease Mother   . Hypertension Mother   . Diabetes Mother   . Diabetes Sister   . Anesthesia problems Neg Hx    History   Social History  . Marital Status: Single    Spouse Name: N/A    Number of Children: N/A  . Years of Education: N/A   Occupational History  . Not on file.   Social History Main Topics  .  Smoking status: Former Smoker    Quit date:  06/15/2000  . Smokeless tobacco: Not on file  . Alcohol Use: No  . Drug Use: No  . Sexually Active: Not on file   Other Topics Concern  . Not on file   Social History Narrative   The patient lives alone in [place].  She is a retired nurse-Psychiatric and ortho.  She is disabled and receives most of her care from the [place], [place].  Son-shawn-[phone] alone   Review of Systems: Pertinent items are noted in HPI.  Physical Exam: Blood pressure 108/69, pulse 66, temperature 97.9 F (36.6 C), temperature source Oral, resp. rate 14, SpO2 99.00%. Vitals reviewed. General: resting in bed, NAD HEENT: PERRLA, EOMI, no scleral icterus Cardiac: paced rhythm, 3/6 blowing SEM, visible right carotid pulsations Chest: R HD cath in place, midline sternotomy scar, pacemaker in place left upper chest Pulm: clear to auscultation bilaterally, no wheezes, rales, or rhonchi Abd: soft, diffuse tenderness to deep palpation localized mainly to RLQ and LLQ, nondistended, BS present Ext: warm and well perfused, no pedal edema, +2dp b/l, LUE: non pitting edema, AVG in place with no palpable thrill,  Neuro: alert and oriented X3, cranial nerves II-XII grossly intact, strength and sensation to light touch equal in bilateral upper and lower extremities Skin: dry  Lab results: Basic Metabolic Panel:  Basename 05/05/12 2058  NA 134*  K 2.8*  CL 95*  CO2 30  GLUCOSE 160*  BUN 9  CREATININE 2.62*  CALCIUM 8.8  MG --  PHOS --   CBC:  Basename 05/05/12 2058  WBC 9.1  NEUTROABS --  HGB 8.6*  HCT 28.7*  MCV 91.1  PLT 113*   Imaging results:  Dg Chest Port 1 View  05/05/2012  *RADIOLOGY REPORT*  Clinical Data:  Chest pain  PORTABLE CHEST - 1 VIEW  Comparison: Portable exam 2120 hours compared to 04/21/2012  Findings: Right jugular dual-lumen central venous catheter tip projecting over right atrium. Left subclavian transvenous pacemaker / AICD leads project over right atrium, right  ventricle, and coronary sinus. Enlargement of cardiac silhouette post CABG. Pulmonary vascular ingestion. Mild perihilar infiltrates suspect edema. No gross pleural effusion or pneumothorax.  IMPRESSION: Enlargement of cardiac silhouette with pulmonary vascular congestion post CABG and pacemaker / AICD. Probable perihilar edema.   Original Report Authenticated By: Ulyses Southward, M.D.    Other results: EKG: 70bpm, paired PVC, LBBB, prolonged QTC 522  Assessment & Plan by Problem: Ms. Malkiewicz is a 61 year old female with PMH of ESRD on HD, chronic systolic CHF (Echo 09/2011 EF 20-25%), s/p CABG 2008, HTN, DMII admitted for chest pain.    Chest pain--hx of Chronic systolic CHF with Echo in 09/2011 EF 20-25%, s/p CABG 2008, pacemaker in place.  ACS workup ongoing, possibly secondary to NSTEMI vs. Unstable angina given chest pain at rest, positive troponin trending up: 0.08-->0.42, however no obvious signs of ST changes on EKG and patient has ESRD.  TIMI score 4 risk 20%.  Could also be secondary to musculoskeletal given right sided distribution of pain, however, sudden onset with strong cardiac history makes ACS more likely.  CXR on admission: enlargement of cardiac silhouette with pulmonary vascular congestion, probable perihilar edema, no gross pleural effusion or pneumothorax making PNA or pneumothorax less likely.  Cardiology consulted-Dr. Herbie Baltimore, will continue to follow, possible Cath in AM.    -admit to tele -cycle CE -AM EKG -ASA -IV Nitroglycerin, keep SBP>100 -IV Heparin -Coreg -Statin -O2 Clancy PRN,  keep o2 sat >92% -AM labs: cbc, cmp, mag, phos -risk stratification: HbA1c, TSH, lipid panel -NPO--possible Cath in AM -Dilaudid PRN pain -Protonix -Zofran PRN  ESRD on hemodialysis T, Th, Sat.  Had HD today 05/05/12.  Followed by [facility].  Cr 2.62 on admission.  GFR: 21.   -am labs: CMET, Mag, Phos -given potassium IV in ED -continue to monitor -HD will be scheduled for Saturday,  but may need to be done tomorrow given need for contrast for cath if done--discuss with renal -f/u renal  Chronic systolic congestive heart failure, NYHA class 3- EF 20-25%--Echo in 09/2011 EF 20-25%, s/p CABG 2008 with Cath 11/2006--3 vessel disease including stenosis of LAD-LIMA (stented), stenosis of SVG to diagonal (not stented), pacemaker in place.  Followed at [facility] where pacemaker was placed.  On home regimen: coreg 3.125mg  BID, Ranexa 500mg  qd.  -Cardiology consulted--Dr. Herbie Baltimore -possible Cath in AM -continue coreg -ASA   Hypertension--BP on admission: 108/69.  Noted to be hypotensive during HD.  Is on Midodrine 5mg  BID and Coreg 3.125mg  BID at home -hold midodrine -continue coreg -continue to monitor   Anemia in chronic kidney disease (CKD)--ESRD on HD.  Hb 8.6 on admission, baseline ~7. -AM CBC -continue to monitor  Diabetes Mellitus II--well controlled, ESRD on HD.  Last HbA1c: 5.7 09/2011.  On no diabetic medication at home. -f/u HbA1c -CBG monitoring -SSI sensitive -continue to monitor  Diet: NPO for likely cath in AM  DVT Ppx: on IV Heparin  Dispo: Disposition is deferred at this time, awaiting improvement of current medical problems. Anticipated discharge in approximately 2-3 day(s).   The patient does have a current PCP (ARMOUR, ROSS B, MD), therefore will not be requiring OPC follow-up after discharge.   The patient does not have transportation limitations that hinder transportation to clinic appointments.  SignedDarden Palmer 05/06/2012, 12:11 AM

## 2012-05-06 NOTE — Progress Notes (Signed)
To the cath lab by bed. 

## 2012-05-06 NOTE — Progress Notes (Signed)
Infection prevention called and claimed that pt. 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

## 2012-05-06 NOTE — H&P (Signed)
INTERNAL MEDICINE TEACHING SERVICE Attending Admission Note  Date: 05/06/2012  Patient name: [name]  Medical record number: [id number]  Date of birth: 1950-11-18    I have seen and evaluated Threasa Heads and discussed their care with the Residency Team. 61 yr. 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 SBP, presented with CP. She states the pain began on her right thumb yesterday during dialysis, then radiated up her right arm to her mid chest. She received a bolus during HD and this improved her pain. The pain occurred again at home, which prompted her to take two SL NTG without relief of pain. She had associated nausea and diaphoresis. She came to the ED since this pain reminded her of her pre-CABG pain. She was admitted by our night team and placed on NTG, heparin gtt, due to +trop in setting of significant CAD. Overnight she states she has remained CP free, but she still has some slight right thumb pain. She was noted to have peak Trop I of 1.39. Cardiology has evaluated her and there are plans for cath today with possible PCI.   Physical Exam: Blood pressure 116/64, pulse 66, temperature 97.4 F (36.3 C), temperature source Oral, resp. rate 12, height 5\' 4"  (1.626 m), weight 119 lb 0.8 oz (54 kg), SpO2 98.00%.  General: Vital signs reviewed and noted. Well-developed, well-nourished, in no acute distress; alert, appropriate and cooperative throughout examination.  Head: Normocephalic, atraumatic.  Eyes: PERRL, EOMI, No signs of anemia or jaundince.  Nose: Mucous membranes moist, not inflammed, nonerythematous.  Throat: Oropharynx nonerythematous, no exudate appreciated.   Neck: No deformities,  masses, or tenderness noted.Supple, No carotid Bruits, no JVD.  Lungs:  Normal respiratory effort. Clear to auscultation BL without crackles or wheezes.  Heart: Paced, 3/6 SEM, no JVD. Noted L pacer in place as well as Right tunneled HD catheter which are both clean without erythema or tenderness.  Abdomen:  BS normoactive. Soft, Nondistended, non-tender.  No masses or organomegaly.  Extremities: No pretibial edema.  Neurologic: A&O X3, CN II - XII are grossly intact. Motor strength is 5/5 in the all 4 extremities, Sensations intact to light touch, Cerebellar signs negative.  Skin: No visible rashes, scars.    Lab results: Results for orders placed during the hospital encounter of 05/05/12 (from the past 24 hour(s))  CBC     Status: Abnormal   Collection Time   05/05/12  8:58 PM      Component Value Range   WBC 9.1  4.0 - 10.5 K/uL   RBC 3.15 (*) 3.87 - 5.11 MIL/uL   Hemoglobin 8.6 (*) 12.0 - 15.0 g/dL   HCT 40.9 (*) 81.1 - 91.4 %   MCV 91.1  78.0 - 100.0 fL   MCH 27.3  26.0 - 34.0 pg   MCHC 30.0  30.0 - 36.0 g/dL   RDW 78.2 (*) 95.6 - 21.3 %   Platelets 113 (*) 150 - 400 K/uL  BASIC METABOLIC PANEL     Status: Abnormal   Collection Time   05/05/12  8:58 PM      Component Value Range   Sodium 134 (*) 135 - 145 mEq/L   Potassium 2.8 (*)  3.5 - 5.1 mEq/L   Chloride 95 (*) 96 - 112 mEq/L   CO2 30  19 - 32 mEq/L   Glucose, Bld 160 (*) 70 - 99 mg/dL   BUN 9  6 - 23 mg/dL   Creatinine, Ser 4.09 (*) 0.50 - 1.10 mg/dL   Calcium 8.8  8.4 - 81.1 mg/dL   GFR calc non Af Amer 18 (*) >90 mL/min   GFR calc Af Amer 21 (*) >90 mL/min  POCT I-STAT TROPONIN I     Status: Normal   Collection Time   05/05/12  9:28 PM      Component Value Range   Troponin i, poc 0.08  0.00 - 0.08 ng/mL   Comment NOTIFIED PHYSICIAN     Comment 3           POCT I-STAT TROPONIN I     Status: Abnormal   Collection Time   05/06/12  1:12 AM      Component Value Range   Troponin i, poc 0.42 (*) 0.00 - 0.08 ng/mL     Comment NOTIFIED PHYSICIAN     Comment 3           TROPONIN I     Status: Abnormal   Collection Time   05/06/12  4:39 AM      Component Value Range   Troponin I 1.39 (*) <0.30 ng/mL  CBC     Status: Abnormal   Collection Time   05/06/12  4:39 AM      Component Value Range   WBC 6.8  4.0 - 10.5 K/uL   RBC 2.96 (*) 3.87 - 5.11 MIL/uL   Hemoglobin 8.1 (*) 12.0 - 15.0 g/dL   HCT 91.4 (*) 78.2 - 95.6 %   MCV 92.6  78.0 - 100.0 fL   MCH 27.4  26.0 - 34.0 pg   MCHC 29.6 (*) 30.0 - 36.0 g/dL   RDW 21.3 (*) 08.6 - 57.8 %   Platelets 107 (*) 150 - 400 K/uL  HEMOGLOBIN A1C     Status: Normal   Collection Time   05/06/12  4:39 AM      Component Value Range   Hemoglobin A1C 4.8  <5.7 %   Mean Plasma Glucose 91  <117 mg/dL  COMPREHENSIVE METABOLIC PANEL     Status: Abnormal   Collection Time   05/06/12  4:39 AM      Component Value Range   Sodium 133 (*) 135 - 145 mEq/L   Potassium 3.3 (*) 3.5 - 5.1 mEq/L   Chloride 95 (*) 96 - 112 mEq/L   CO2 31  19 - 32 mEq/L   Glucose, Bld 113 (*) 70 - 99 mg/dL   BUN 10  6 - 23 mg/dL   Creatinine, Ser 4.69 (*) 0.50 - 1.10 mg/dL   Calcium 9.2  8.4 - 62.9 mg/dL   Total Protein 6.2  6.0 - 8.3 g/dL   Albumin 2.2 (*) 3.5 - 5.2 g/dL   AST 17  0 - 37 U/L   ALT 5  0 - 35 U/L   Alkaline Phosphatase 72  39 - 117 U/L   Total Bilirubin 0.3  0.3 - 1.2 mg/dL   GFR calc non Af Amer 15 (*) >90 mL/min   GFR calc Af Amer 18 (*) >90 mL/min  TSH     Status: Normal   Collection Time   05/06/12  4:39 AM      Component Value Range   TSH 2.775  0.350 - 4.500 uIU/mL  LIPID PANEL     Status: Normal   Collection Time   05/06/12  4:39 AM      Component Value Range   Cholesterol 91  0 - 200 mg/dL   Triglycerides 44  <409 mg/dL   HDL 46  >81 mg/dL   Total CHOL/HDL Ratio 2.0     VLDL 9  0 - 40 mg/dL   LDL Cholesterol 36  0 - 99 mg/dL  MAGNESIUM     Status: Normal   Collection Time   05/06/12  4:39 AM      Component Value Range   Magnesium 2.3  1.5 - 2.5  mg/dL  PHOSPHORUS     Status: Normal   Collection Time   05/06/12  4:39 AM      Component Value Range   Phosphorus 3.4  2.3 - 4.6 mg/dL  GLUCOSE, CAPILLARY     Status: Normal   Collection Time   05/06/12  7:40 AM      Component Value Range   Glucose-Capillary 88  70 - 99 mg/dL  TROPONIN I     Status: Abnormal   Collection Time   05/06/12  8:33 AM      Component Value Range   Troponin I 1.36 (*) <0.30 ng/mL  HEPARIN LEVEL (UNFRACTIONATED)     Status: Abnormal   Collection Time   05/06/12  8:38 AM      Component Value Range   Heparin Unfractionated 0.13 (*) 0.30 - 0.70 IU/mL  CBC     Status: Abnormal   Collection Time   05/06/12  8:38 AM      Component Value Range   WBC 8.0  4.0 - 10.5 K/uL   RBC 3.11 (*) 3.87 - 5.11 MIL/uL   Hemoglobin 8.4 (*) 12.0 - 15.0 g/dL   HCT 19.1 (*) 47.8 - 29.5 %   MCV 91.6  78.0 - 100.0 fL   MCH 27.0  26.0 - 34.0 pg   MCHC 29.5 (*) 30.0 - 36.0 g/dL   RDW 62.1 (*) 30.8 - 65.7 %   Platelets 100 (*) 150 - 400 K/uL  GLUCOSE, CAPILLARY     Status: Normal   Collection Time   05/06/12 12:05 PM      Component Value Range   Glucose-Capillary 86  70 - 99 mg/dL    Imaging results:  Dg Chest Port 1 View  05/05/2012  *RADIOLOGY REPORT*  Clinical Data:  Chest pain  PORTABLE CHEST - 1 VIEW  Comparison: Portable exam 2120 hours compared to 04/21/2012  Findings: Right jugular dual-lumen central venous catheter tip projecting over right atrium. Left subclavian transvenous pacemaker / AICD leads project over right atrium, right ventricle, and coronary sinus. Enlargement of cardiac silhouette post CABG. Pulmonary vascular ingestion. Mild perihilar infiltrates suspect edema. No gross pleural effusion or pneumothorax.  IMPRESSION: Enlargement of cardiac silhouette with pulmonary vascular congestion post CABG and pacemaker / AICD. Probable perihilar edema.   Original Report Authenticated By: Ulyses Southward, M.D.      Assessment and Plan: I agree with the formulated  Assessment and Plan with the following changes: 61 yr. 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 SBP, presented  with CP and found to have NSTEMI. 1) NSTEM/chronic systolic heart failureI: Agree with holding Ranolazine at this time. Continue to trend Trop. She is currently hemodynamically stable and CP free. Agree with NTG gtt heparin gtt (follow aPTT).  Dilaudid for pain.  Coreg, lipitor, ASA. 2) ESRD: noted to be hypokalemic on admission. I would be careful with replacing K in this patient. K can be adjusted with K bath during dialysis. Repeat BMP later today since I see this has been replaced. 3) Type 2 DM: Cont home meds, watch for hypoglycemia while NPO. 4) recent UTI/SBP: On Cipro. She will need follow up, no abdominal complaints this admission. 5) HTN: agree with holding midodrine in setting of NSTEMI at this time, but this will need to be restarted at discharge. 6) Anemia chronic disease: Nephro to follow/EPO as needed to maintain goal Hgb. 7) rest per resident note.  Jonah Blue, DO 11/22/201312:40 PM

## 2012-05-07 LAB — BASIC METABOLIC PANEL
CO2: 27 mEq/L (ref 19–32)
Chloride: 95 mEq/L — ABNORMAL LOW (ref 96–112)
Creatinine, Ser: 3.98 mg/dL — ABNORMAL HIGH (ref 0.50–1.10)
Glucose, Bld: 141 mg/dL — ABNORMAL HIGH (ref 70–99)
Sodium: 131 mEq/L — ABNORMAL LOW (ref 135–145)

## 2012-05-07 LAB — GLUCOSE, CAPILLARY: Glucose-Capillary: 192 mg/dL — ABNORMAL HIGH (ref 70–99)

## 2012-05-07 LAB — CBC
Hemoglobin: 8.7 g/dL — ABNORMAL LOW (ref 12.0–15.0)
MCV: 92.3 fL (ref 78.0–100.0)
Platelets: 131 10*3/uL — ABNORMAL LOW (ref 150–400)
RBC: 3.26 MIL/uL — ABNORMAL LOW (ref 3.87–5.11)
WBC: 14.5 10*3/uL — ABNORMAL HIGH (ref 4.0–10.5)

## 2012-05-07 LAB — TROPONIN I: Troponin I: 0.73 ng/mL (ref <0.30)

## 2012-05-07 MED ORDER — ONDANSETRON HCL 4 MG/2ML IJ SOLN
INTRAMUSCULAR | Status: AC
  Administered 2012-05-07: 4 mg
  Filled 2012-05-07: qty 2

## 2012-05-07 MED ORDER — HYDROMORPHONE HCL PF 1 MG/ML IJ SOLN
INTRAMUSCULAR | Status: AC
  Administered 2012-05-07: 0.5 mg via INTRAVENOUS
  Filled 2012-05-07: qty 1

## 2012-05-07 MED ORDER — SODIUM CHLORIDE 0.9 % IV SOLN: 100.0000 mL | INTRAVENOUS | Status: DC | PRN

## 2012-05-07 MED ORDER — PENTAFLUOROPROP-TETRAFLUOROETH EX AERO: 1.0000 "application " | INHALATION_SPRAY | CUTANEOUS | Status: DC | PRN

## 2012-05-07 MED ORDER — ASPIRIN EC 81 MG PO TBEC
81.0000 mg | DELAYED_RELEASE_TABLET | Freq: Every day | ORAL | Status: DC
  Administered 2012-05-07 – 2012-05-11 (×5): 81 mg via ORAL
  Filled 2012-05-07 (×5): qty 1

## 2012-05-07 MED ORDER — LIDOCAINE-PRILOCAINE 2.5-2.5 % EX CREA: 1.0000 "application " | TOPICAL_CREAM | CUTANEOUS | Status: DC | PRN

## 2012-05-07 MED ORDER — ALTEPLASE 2 MG IJ SOLR: 2.0000 mg | Freq: Once | INTRAMUSCULAR | Status: DC | PRN

## 2012-05-07 MED ORDER — CARVEDILOL 6.25 MG PO TABS
6.2500 mg | ORAL_TABLET | Freq: Two times a day (BID) | ORAL | Status: DC
  Administered 2012-05-07 – 2012-05-08 (×2): 6.25 mg via ORAL
  Filled 2012-05-07 (×5): qty 1

## 2012-05-07 MED ORDER — HEPARIN SODIUM (PORCINE) 1000 UNIT/ML DIALYSIS
20.0000 [IU]/kg | INTRAMUSCULAR | Status: DC | PRN
  Administered 2012-05-07: 1100 [IU] via INTRAVENOUS_CENTRAL

## 2012-05-07 MED ORDER — LIDOCAINE HCL (PF) 1 % IJ SOLN: 5.0000 mL | INTRAMUSCULAR | Status: DC | PRN

## 2012-05-07 MED ORDER — HEPARIN SODIUM (PORCINE) 1000 UNIT/ML DIALYSIS: 1000.0000 [IU] | INTRAMUSCULAR | Status: DC | PRN

## 2012-05-07 MED ORDER — DARBEPOETIN ALFA-POLYSORBATE 150 MCG/0.3ML IJ SOLN
INTRAMUSCULAR | Status: AC
  Administered 2012-05-07: 13:00:00
  Filled 2012-05-07: qty 0.3

## 2012-05-07 MED ORDER — ONDANSETRON HCL 4 MG/2ML IJ SOLN: 4.0000 mg | Freq: Once | INTRAMUSCULAR | Status: DC

## 2012-05-07 NOTE — Progress Notes (Signed)
Subjective:  [name] was seen in HD today. She complains of her R hand IV's hurting. She states that she would like to be discharged so she can make her Vascular appt at [facility] on Monday.  She states that she know her heart is "damaged" and she has decided to be DNR/DNI. She does not want to be intubated and have her family "see her like that." She states "if my heart stops, it's just my time"  She denies chest pain, SOB, fever, chills.  Objective: Vital signs in last 24 hours: Filed Vitals:   05/07/12 0500 05/07/12 0600 05/07/12 0700 05/07/12 0733  BP: 126/80 127/75 135/79   Pulse: 84 88 96   Temp:    100.2 F (37.9 C)  TempSrc:    Oral  Resp: 17 17 18    Height:      Weight: 121 lb 11.1 oz (55.2 kg)     SpO2: 100% 94% 94%    Weight change: 1 lb 12.2 oz (0.8 kg)  Intake/Output Summary (Last 24 hours) at 05/07/12 0843 Last data filed at 05/07/12 0700  Gross per 24 hour  Intake 1298.5 ml  Output     80 ml  Net 1218.5 ml    Physical Exam Blood pressure 135/79, pulse 96, temperature 100.2 F (37.9 C), temperature source Oral, resp. rate 18, height 5\' 4"  (1.626 m), weight 121 lb 11.1 oz (55.2 kg), SpO2 94.00%. General:  No acute distress, alert and oriented x 3, well-appearing  HEENT:  PERRL, EOMI, moist mucous membranes Cardiovascular:  Regular rate and rhythm, systolic murmur LSB Chest: HD cath in place Respiratory:  clear, no rhonchi. Abdomen:  Soft, nondistended, nontender, bowel sounds present Extremities:  Warm and well-perfused, no LE edema.  LUE with AVF, nonpitting edema throughout whole extremity. RUE wnl. Skin: Warm, dry, no rashes Neuro: Not anxious appearing, no depressed mood, normal affect  Lab Results: Basic Metabolic Panel:  Lab 05/07/12 1610 05/06/12 1841 05/06/12 0439  NA 131* 131* --  K 3.9 4.0 --  CL 95* 95* --  CO2 27 29 --  GLUCOSE 141* 194* --  BUN 19 12 --  CREATININE 3.98* 3.34* --  CALCIUM 10.4 10.0 --  MG -- -- 2.3  PHOS -- --  3.4   Liver Function Tests:  Lab 05/06/12 0439  AST 17  ALT 5  ALKPHOS 72  BILITOT 0.3  PROT 6.2  ALBUMIN 2.2*   CBC:  Lab 05/07/12 0648 05/06/12 0838  WBC 14.5* 8.0  NEUTROABS -- --  HGB 8.7* 8.4*  HCT 30.1* 28.5*  MCV 92.3 91.6  PLT 131* 100*   Cardiac Enzymes:  Lab 05/06/12 2350 05/06/12 1637 05/06/12 0833  CKTOTAL -- -- --  CKMB -- -- --  CKMBINDEX -- -- --  TROPONINI 0.73* 0.60* 1.36*   Fasting Lipid Panel:  Lab 05/06/12 0439  CHOL 91  HDL 46  LDLCALC 36  TRIG 44  CHOLHDL 2.0  LDLDIRECT --     Studies/Results: Dg Chest Port 1 View  05/05/2012  *RADIOLOGY REPORT*  Clinical Data:  Chest pain  PORTABLE CHEST - 1 VIEW  Comparison: Portable exam 2120 hours compared to 04/21/2012  Findings: Right jugular dual-lumen central venous catheter tip projecting over right atrium. Left subclavian transvenous pacemaker / AICD leads project over right atrium, right ventricle, and coronary sinus. Enlargement of cardiac silhouette post CABG. Pulmonary vascular ingestion. Mild perihilar infiltrates suspect edema. No gross pleural effusion or pneumothorax.  IMPRESSION: Enlargement of cardiac silhouette with  pulmonary vascular congestion post CABG and pacemaker / AICD. Probable perihilar edema.   Original Report Authenticated By: Ulyses Southward, M.D.    Medications: I have reviewed the patient's current medications. Scheduled Meds:    . aspirin EC  81 mg Oral Daily  . atorvastatin  40 mg Oral q1800  . carvedilol  6.25 mg Oral BID WC  . ciprofloxacin  500 mg Oral Daily  . darbepoetin (ARANESP) injection - DIALYSIS  150 mcg Intravenous Q Sat-HD  . feeding supplement (NEPRO CARB STEADY)  237 mL Oral BID BM  . [COMPLETED] fentaNYL      . [COMPLETED] fentaNYL      . [COMPLETED] heparin      . [COMPLETED] heparin  1,500 Units Intravenous Once  . hydrALAZINE  12.5 mg Oral Q8H  . insulin aspart  0-9 Units Subcutaneous TID WC  . isosorbide mononitrate  30 mg Oral Daily  .  [COMPLETED] lidocaine      . methadone  5 mg Oral TID  . [COMPLETED] midazolam      . [COMPLETED] midazolam      . multivitamin  1 tablet Oral Daily  . [COMPLETED] nitroGLYCERIN      . pantoprazole  40 mg Oral Daily  . senna  1 tablet Oral BID  . sevelamer  800 mg Oral TID WC  . sodium chloride  3 mL Intravenous Q12H  . [DISCONTINUED] aspirin EC  325 mg Oral Daily  . [DISCONTINUED] atorvastatin  80 mg Oral q1800  . [DISCONTINUED] carvedilol  3.125 mg Oral BID WC  . [DISCONTINUED] feeding supplement (NEPRO CARB STEADY)  237 mL Oral BID WC  . [DISCONTINUED] heparin  1,500 Units Intravenous Once  . [COMPLETED] potassium chloride  10 mEq Intravenous Once   Continuous Infusions:    . sodium chloride Stopped (05/06/12 1306)  . sodium chloride 30 mL/hr at 05/06/12 1630  . nitroGLYCERIN Stopped (05/06/12 1306)  . [DISCONTINUED] heparin 850 Units/hr (05/06/12 0956)   PRN Meds:.sodium chloride, acetaminophen, acetaminophen, ALPRAZolam, calcium carbonate (dosed in mg elemental calcium), camphor-menthol, docusate sodium, feeding supplement (NEPRO CARB STEADY), HYDROmorphone (DILAUDID) injection, hydrOXYzine, ondansetron (ZOFRAN) IV, sorbitol, zolpidem, [DISCONTINUED] acetaminophen, [DISCONTINUED] acetaminophen, [DISCONTINUED] ondansetron (ZOFRAN) IV, [DISCONTINUED] ondansetron (ZOFRAN) IV, [DISCONTINUED] ondansetron  Assessment/Plan:  [name] is a 61 year old female with PMH of ESRD on HD, chronic systolic CHF (Echo 09/2011 EF 20-25%), s/p CABG 2008, HTN, DMII admitted for chest pain.   NSTEMI Patient has a hx of Chronic systolic CHF with Echo in 09/2011 EF 20-25%, s/p CABG 2008, pacemaker in place. Troponins trending up: 0.08-->0.42-->1.3. EKG with no obvious signs of ST changes on EKG and patient has ESRD. TIMI score 4 risk 20%. Could also be secondary to musculoskeletal given right sided distribution of pain, however, sudden onset with strong cardiac history makes ACS more likely. CXR on  admission: enlargement of cardiac silhouette with pulmonary vascular congestion, probable perihilar edema, no gross pleural effusion or pneumothorax making PNA or pneumothorax less likely. Cardiology consulted-Dr. Herbie Baltimore, will continue to follow, possible Cath in AM.  Cath 11/22: with lesions in LAD, OM, RCA, and 2/4 grafts. Est EF was 10% with hypokinesis and dilation of LV Troponins trending down  -maximal medical therapy for CAD: increase coreg to 6.25 BID, hydralazine added -ASA, statin -IV Nitroglycerin, keep SBP>100  -IV Heparin  -O2 [place] PRN, keep o2 sat >92%  -risk stratification: HbA1c, TSH, lipid panel    Ischemic Cardiomyopathy- -Echo in 09/2011 EF 20-25%, s/p CABG 2008 with Cath  11/2006--3 vessel disease including stenosis of LAD-LIMA (stented), stenosis of SVG to diagonal (not stented), pacemaker in place. Followed at [facility] where pacemaker was placed. Was on home Ranexa 500mg  qd.  EF from cath 11/23 with EF 10%  -ECHO today  -increase coreg dose as above, cont ASA  -hydralazine added -monitor BP  Leukocytosis 11/23: WBC increased to 14.5 today from 8. May be stress reaction from cardiac cath yesterday vs. SCP vs UTI -bl and urine cx ordered -follow CBC  ESRD on hemodialysis T, Th, Sat. Had HD on 05/05/12. Followed by [facility]. Cr 2.62 on admission. GFR: 21.  -HD today -will need Tuesday HD set up if DC tomorrow  # Hx of Cirrhosis with Ascites  Pt states she is s/p esophageal variceal banding procedure, though we do not have records of that. Last EGD with abnormal mass and gastritis, but no varices noted. She states she is followed at [facility] where she undergoes intermittent abdominal paracentesis usually every 2 weeks. Last drainage was one week ago. Recently treated with Flagyl/Cipro with concern for SBP. -with elevated white count and fever to 101 last night, will get blood cx and urine cx -cont cipro  Hypertension--BP on admission: 108/69. Noted to be  hypotensive during HD. Is on Midodrine 5mg  BID and Coreg 3.125mg  BID at home. Midodrine discontinued. -increase coreg as above  Anemia of chronic kidney disease (CKD)--ESRD on HD. Hb 8.6 on admission, baseline ~7.  -daily CBC  -continue to monitor, transfuse as needed with HD  Diabetes Mellitus II--well controlled, ESRD on HD. Last HbA1c: 5.7 09/2011. On no diabetic medication at home.   -CBG monitoring  -SSI sensitive  -continue to monitor   Chronic pancreatitis -cont home methadone  Nutrition -renal diet with supplementation  DVT Ppx: refusing SCDs -encourage use to prevent DVTs  Dispo:  -possible d/c tomorrow  -The patient does have a current PCP (ARMOUR, ROSS B, MD), therefore will not be requiring OPC follow-up after discharge.  -The patient does not have transportation limitations that hinder transportation to clinic appointments.    LOS: 2 days   [place] Ar 05/07/2012, 8:43 AM

## 2012-05-07 NOTE — Progress Notes (Signed)
Patient has temp 101.0 orally.  Patient is covered in blankets.  Removed all blankets, temp down to 100.6 orally.  Patient refuses tylenol due "fatty liver".

## 2012-05-07 NOTE — Progress Notes (Signed)
[facility] Progress Note  Subjective:  No chest pain  Objective:   Vital Signs in the last 24 hours: Temp:  [97.4 F (36.3 C)-101 F (38.3 C)] 100.2 F (37.9 C) (11/23 0733) Pulse Rate:  [61-99] 96  (11/23 0700) Resp:  [12-25] 18  (11/23 0700) BP: (115-144)/(61-84) 135/79 mmHg (11/23 0700) SpO2:  [92 %-100 %] 94 % (11/23 0700) Weight:  [55.2 kg (121 lb 11.1 oz)] 55.2 kg (121 lb 11.1 oz) (11/23 0500)  Intake/Output from previous day: 11/22 0701 - 11/23 0700 In: 1418.5 [P.O.:720; I.V.:598.5; IV Piggyback:100] Out: 80 [Urine:80]  Scheduled:   . aspirin EC  325 mg Oral Daily  . atorvastatin  40 mg Oral q1800  . carvedilol  3.125 mg Oral BID WC  . ciprofloxacin  500 mg Oral Daily  . darbepoetin (ARANESP) injection - DIALYSIS  150 mcg Intravenous Q Sat-HD  . feeding supplement (NEPRO CARB STEADY)  237 mL Oral BID BM  . [COMPLETED] fentaNYL      . [COMPLETED] fentaNYL      . [COMPLETED] heparin      . [COMPLETED] heparin  1,500 Units Intravenous Once  . hydrALAZINE  12.5 mg Oral Q8H  . insulin aspart  0-9 Units Subcutaneous TID WC  . isosorbide mononitrate  30 mg Oral Daily  . [COMPLETED] lidocaine      . methadone  5 mg Oral TID  . [COMPLETED] midazolam      . [COMPLETED] midazolam      . multivitamin  1 tablet Oral Daily  . [COMPLETED] nitroGLYCERIN      . pantoprazole  40 mg Oral Daily  . senna  1 tablet Oral BID  . sevelamer  800 mg Oral TID WC  . sodium chloride  3 mL Intravenous Q12H  . [DISCONTINUED] atorvastatin  80 mg Oral q1800  . [DISCONTINUED] feeding supplement (NEPRO CARB STEADY)  237 mL Oral BID WC  . [DISCONTINUED] heparin  1,500 Units Intravenous Once  . [COMPLETED] potassium chloride  10 mEq Intravenous Once    Physical Exam:   General appearance: alert, cooperative and no distress Neck: no JVD and supple, symmetrical, trachea midline Lungs: decreased breath sounds Heart: S1, S2 normal and 1-2/6 sem Abdomen: soft,  non-tender; bowel sounds normal; no masses,  no organomegaly R groin site stable without hematoma or ecchymosis Extremities: no edema, redness or tenderness in the calves or thighs   Rate: 90  Rhythm: normal sinus rhythm  Lab Results:    Basename 05/06/12 1841 05/06/12 0838  NA 131* 137  K 4.0 3.5  CL 95* 98  CO2 29 33*  GLUCOSE 194* 80  BUN 12 10  CREATININE 3.34* 3.23*    Basename 05/06/12 2350 05/06/12 1637  TROPONINI 0.73* 0.60*   Hepatic Function Panel  Basename 05/06/12 0439  PROT 6.2  ALBUMIN 2.2*  AST 17  ALT 5  ALKPHOS 72  BILITOT 0.3  BILIDIR --  IBILI --    Basename 05/06/12 0838  INR 1.42    Lipid Panel     Component Value Date/Time   CHOL 91 05/06/2012 0439   TRIG 44 05/06/2012 0439   HDL 46 05/06/2012 0439   CHOLHDL 2.0 05/06/2012 0439   VLDL 9 05/06/2012 0439   LDLCALC 36 05/06/2012 0439     Imaging:  Dg Chest Port 1 View  05/05/2012  *RADIOLOGY REPORT*  Clinical Data:  Chest pain  PORTABLE CHEST - 1 VIEW  Comparison: Portable exam 2120 hours  compared to 04/21/2012  Findings: Right jugular dual-lumen central venous catheter tip projecting over right atrium. Left subclavian transvenous pacemaker / AICD leads project over right atrium, right ventricle, and coronary sinus. Enlargement of cardiac silhouette post CABG. Pulmonary vascular ingestion. Mild perihilar infiltrates suspect edema. No gross pleural effusion or pneumothorax.  IMPRESSION: Enlargement of cardiac silhouette with pulmonary vascular congestion post CABG and pacemaker / AICD. Probable perihilar edema.   Original Report Authenticated By: Ulyses Southward, M.D.       Assessment/Plan:   Principal Problem:  *NSTEMI (non-ST elevated myocardial infarction) Active Problems:  Ischemic cardiomyopathy, EF 20-25% echo April 2013, now 10% by cath (05/06/12)  Chronic systolic congestive heart failure, NYHA class 3  LBBB (left bundle branch block)  Hypertension  Pancreatic pseudocyst   Pancreatitis, chronic  Anemia in chronic kidney disease (CKD)  Esophageal varices in cirrhosis, admitted June 2013 to [facility]  ESRD on hemodialysis  CAD, CABG X 4 Feb '08, LAD stent June '08  NSVT 8-10bts 05/06/12  ICD (implantable cardiac defibrillator) in place, MDT placed Jan 2013 [facility]  No further chest pain.  Severe cardiomyopathy. Medical therapy for CAD.  Will titrate carvedilol to 6.25 mg bid.  Tolerating the addition of hydralazine to nitrates; titrate as BP allows. For dialysis today and echo.    Lennette Bihari, MD, [facility] 05/07/2012, 8:00 AM

## 2012-05-07 NOTE — Progress Notes (Signed)
Subjective: No complaints, on dialysis, had heart cath yesterday with no intervention. Plan is for medical therapy of CAD per Cards. EF was lower than last at 10%  Objective Vital signs in last 24 hours: Filed Vitals:   05/07/12 0930 05/07/12 1000 05/07/12 1030 05/07/12 1100  BP: 139/87 118/75 125/88 123/77  Pulse: 98 98 90 86  Temp:      TempSrc:      Resp: 26 19 20 20   Height:      Weight:      SpO2:       Weight change: 0.8 kg (1 lb 12.2 oz)  Intake/Output Summary (Last 24 hours) at 05/07/12 1114 Last data filed at 05/07/12 0700  Gross per 24 hour  Intake   1247 ml  Output     80 ml  Net   1167 ml   Labs: Basic Metabolic Panel:  Lab 05/07/12 3664 05/06/12 1841 05/06/12 0838 05/06/12 0439 05/05/12 2058  NA 131* 131* 137 133* 134*  K 3.9 4.0 3.5 3.3* 2.8*  CL 95* 95* 98 95* 95*  CO2 27 29 33* 31 30  GLUCOSE 141* 194* 80 113* 160*  BUN 19 12 10 10 9   CREATININE 3.98* 3.34* 3.23* 3.03* 2.62*  ALB -- -- -- -- --  CALCIUM 10.4 10.0 9.6 9.2 8.8  PHOS -- -- -- 3.4 --   Liver Function Tests:  Lab 05/06/12 0439  AST 17  ALT 5  ALKPHOS 72  BILITOT 0.3  PROT 6.2  ALBUMIN 2.2*   No results found for this basename: LIPASE:3,AMYLASE:3 in the last 168 hours No results found for this basename: AMMONIA:3 in the last 168 hours CBC:  Lab 05/07/12 0648 05/06/12 0838 05/06/12 0439 05/05/12 2058  WBC 14.5* 8.0 6.8 9.1  NEUTROABS -- -- -- --  HGB 8.7* 8.4* 8.1* 8.6*  HCT 30.1* 28.5* 27.4* 28.7*  MCV 92.3 91.6 92.6 91.1  PLT 131* 100* 107* 113*   PT/INR: @labrcntip (inr:5) Cardiac Enzymes:  Lab 05/06/12 2350 05/06/12 1637 05/06/12 0833 05/06/12 0439  CKTOTAL -- -- -- --  CKMB -- -- -- --  CKMBINDEX -- -- -- --  TROPONINI 0.73* 0.60* 1.36* 1.39*   CBG:  Lab 05/06/12 1912 05/06/12 1719 05/06/12 1205 05/06/12 0740  GLUCAP 202* 123* 86 88    Iron Studies: No results found for this basename: IRON:30,TIBC:30,TRANSFERRIN:30,FERRITIN:30 in the last 168 hours  Physical  Exam:  Blood pressure 123/77, pulse 86, temperature 100.2 F (37.9 C), temperature source Oral, resp. rate 20, height 5\' 4"  (1.626 m), weight 55.2 kg (121 lb 11.1 oz), SpO2 94.00%.  General: Talkative, slender sitting in bed post heart cath; in no acute distress.  Head: Normocephalic, atraumatic, sclera non-icteric,wearing glasses, mucus membranes are moist (dentures at home)  Neck: Supple. Prominent neck veins; right I-J catheter  Lungs: bibasilar crackles R>L  Heart: RRR with S1 S2. 2/6 murmur  Abdomen: Soft, non-tender, non-distended with normoactive bowel sounds. No rebound/guarding. No obvious abdominal masses. Midline scar  Lower extremities: tr edema or ischemic changes, no open wounds  Neuro: Alert and oriented X 3. Moves all extremities spontaneously.  Psych: Responds to questions appropriately with a normal affect.  Dialysis Access: right I-J active; maturing left lower arm AVGG swelling improving-very little edema in fingers, but still in full arm.   Dialysis Orders: Center: [facility]- transferred there 05/03/2012;  EDW 53.5 HD Bath 2K 2.25 Ca Optiflux 180 Time 4 Heparin 1600. Access left forearm AVGG placed 10/25 Dr. Hollace Hayward -  not used yet; right I-J active BFR 400 DFR 800 Zemplar none IV/HD Epogen 15,000 Units IV/HD No IV Fe   Assessment/Plan:  1. NSTEMI with hx CABG 2008/severe cardiomyopathy w EF 10%, hx of multiple PCIs- s/p cath yesterday, plan is for medical Rx per cardiology. For ECHO today 2. ESRD TTS [facility] with post dialysis hypokalemia upon arrival yesterday- received small dose KCl; she has been on a 2 K bath and obviously not eating enough to warrant this. Will use 4 K here and will need 3 or 4 K bath at discharge.; titrate EDW as tolerated due to edema. Access swelling likely due to central stenosis somewhere. Has left sided ICD- tells me her appt to f/u with her vascular surgeon is Monday the 25th 3. Hypertension/volume - BP controlled; titrating EDW; CXR cites pul vasc  congestion; large IDWG. Attempt to lower EDW while here. 2 kg over dry weight today, UF 3 kg if tolerated. 4. Anemia - Hgb 8.4. Had Fe studies drawn Thursday at her HD centers. Hgb down from 9.9 11/7, but was 8 at discharge 11/10; will dose Aranesp at 150; If Hgb continues to decline will transfuse on HD Saturday. 5. Metabolic bone disease - iTPH drawn 11/21; not on Zemplar and no iPTH levels available from her prior HD center; resume renvela 800 1 ac and check P with pre HD labs. 6. Nutrition - add nepro supplements; needs to be started on oral nutritional supplmenet program (ONSP) at hr HD center after discharge. 7. Hyperlipidemia - on statin 8. DM - no meds needed at this time. 9. Code status/DNR - Dr Eliott Nine signed DNR papers at her dialysis center yesterday after discussion with patient. I have readdressed today. She wishes to continue DNR Status in the hospital.    Dorothy Moselle  MD [facility] [phone] pgr    [phone] cell 05/07/2012, 11:14 AM

## 2012-05-07 NOTE — Cardiovascular Report (Signed)
NAMECADINCE, HILSCHER NO.:  [id number]  MEDICAL RECORD NO.:  [id number]  LOCATION:  [facility]                         FACILITY:  [facility]  PHYSICIAN:  Nicki Guadalajara, M.D.     DATE OF BIRTH:  01/15/1951  DATE OF PROCEDURE:  05/06/2012 DATE OF DISCHARGE:                           CARDIAC CATHETERIZATION   INDICATIONS:  [name] is a 61 year old African American female who has multiple medical problems and in 2008 underwent CABG surgery x4. She also has had recurrent pancreatitis with pancreatic pseudocyst, history of esophageal varices at [facility], cirrhosis, as well as CHF. She has end-stage renal disease, on dialysis.  Apparently in 2008, she was found have a stenosis in the distal aspect of her LIMA graft and apparently underwent stenting of her native LAD.  It was felt that her graft to her diagonal was occluded at that time.  Apparently, the patient is also status post ICD at the [facility].  She was admitted to [facility] last evening with some pain radiating to her chest and she stated the previous thumb pain was similar to her prior heart pain.  She subsequently has had mildly positive troponins.  She is on dialysis Tuesday, Thursdays, and Saturdays.  We had seen her in consultation today.  With her increasing troponin, definitive catheterization was recommended.  PROCEDURE:  After premedication with Versed plus fentanyl intravenously for conscious sedation, the patient was prepped and draped in usual fashion.  The right femoral artery was punctured anteriorly and a 5- French sheath was inserted without difficulty.  Diagnostic catheterization was done utilizing 5-French Judkins for left and right coronary catheters.  The right catheter was also used for selective angiography into the vein graft supplying her circumflex marginal vessel, vein graft supplying her right coronary artery and also the vein graft supplying the diagonal vessel was  visualized.  The LIMA catheter was necessary for selective angiography into left internal mammary artery.  A 5-French pigtail catheter was used for RAO ventriculography. Mynx closure device 5-French was utilized with excellent hemostasis. The patient left the catheterization laboratory pain-free with stable hemodynamics.  HEMODYNAMIC DATA:  Central aortic pressure was 130/71.  Left ventricular pressure 130/14.  Post A-wave 25.  ANGIOGRAPHIC DATA:  There was significant coronary calcification.  The left main coronary artery was a long vessel, which trifurcated into an LAD and intermediate and left circumflex vessel.  The LAD seemed to have 70% diffuse proximal narrowing prior to a widely patent stent.  The distal LAD was small caliber and wrapped around the apex.  The intermediate vessel had 50% proximal stenosis followed by a 60% mid stenosis in the region of its bifurcation.  The circumflex vessel had 80% proximal AV groove stenosis and 80% stenosis in the marginal vessel with flush and fill phenomena seen due to competitive filling via the vein graft.  The right coronary artery had diffuse 80% proximal stenosis, as well as ostial 60 and 90% stenoses in the anterior RV marginal branches.  The distal RCA was not well visualized antegrade.  The vein graft supplying the right coronary artery distally was widely patent.  The vein graft supplying the circumflex vessel was widely  patent and anastomosed into the circumflex marginal branch.  The SVG to the diagonal vessel was small caliber and appears somewhat atretic.  This did extend into the small diagonal vessel where a small branch was seen, but there was not good filling of the small diagonal system.  The LIMA graft actually was patent and not occluded.  There was 50% narrowing in the LAD after the lead insertion.  The distal LAD was small caliber and a flush and fill phenomena was seen due to competitive native  filling.  Left ventriculography revealed a markedly dilated left ventricle with severe global hypokinesis with an ejection fraction of approximately 10%.  IMPRESSION: 1. Severe ischemic cardiomyopathy with dilated left ventricle and     ejection fraction of approximately 10%. 2. Significant native coronary artery disease with coronary     calcification and diffuse ostial proximal 70% left anterior     descending stenosis with a patent proximal left anterior descending     stent; 50-60% proximal ramus intermedius stenosis with 60% mid     stenosis; 80% proximal circumflex stenosis with 80% stenosis in the     marginal vessel and diffuse proximal right coronary artery     narrowing of 80% with 60-90% ostial stenoses in the anterior right     ventricular marginal branches. 3. Patent vein graft to the distal right coronary artery. 4. Patent vein graft to the circumflex marginal vessel. 5. Patent but atretic appearing vein graft supplying a very small     diagonal system with poor visualization of the small diagonal     vessel. 6. Patent left internal mammary artery graft to the left anterior     descending with evidence for 50-60% narrowing in the left anterior     descending which is small just after the left internal mammary     artery insertion.  RECOMMENDATION:  Medical therapy.          ______________________________ Nicki Guadalajara, M.D.     TK/MEDQ  D:  05/06/2012  T:  05/07/2012  Job:  [id number]

## 2012-05-07 NOTE — Progress Notes (Deleted)
Subjective: Fever during the night  Objective: Vital signs in last 24 hours: Temp:  [97.4 F (36.3 C)-101 F (38.3 C)] 100.2 F (37.9 C) (11/23 0733) Pulse Rate:  [61-99] 96  (11/23 0700) Resp:  [12-25] 18  (11/23 0700) BP: (115-144)/(61-84) 135/79 mmHg (11/23 0700) SpO2:  [92 %-100 %] 94 % (11/23 0700) Weight:  [55.2 kg (121 lb 11.1 oz)] 55.2 kg (121 lb 11.1 oz) (11/23 0500) Weight change: 0.8 kg (1 lb 12.2 oz) Last BM Date: 05/04/12 Intake/Output from previous day: +1324 11/22 0701 - 11/23 0700 In: 1418.5 [P.O.:720; I.V.:598.5; IV Piggyback:100] Out: 80 [Urine:80] Intake/Output this shift:    PE: General: Heart: Lungs: Abd: Ext:    Lab Results:  Basename 05/07/12 0648 05/06/12 0838  WBC 14.5* 8.0  HGB 8.7* 8.4*  HCT 30.1* 28.5*  PLT 131* 100*   BMET  Basename 05/06/12 1841 05/06/12 0838  NA 131* 137  K 4.0 3.5  CL 95* 98  CO2 29 33*  GLUCOSE 194* 80  BUN 12 10  CREATININE 3.34* 3.23*  CALCIUM 10.0 9.6    Basename 05/06/12 2350 05/06/12 1637  TROPONINI 0.73* 0.60*    Lab Results  Component Value Date   CHOL 91 05/06/2012   HDL 46 05/06/2012   LDLCALC 36 05/06/2012   TRIG 44 05/06/2012   CHOLHDL 2.0 05/06/2012   Lab Results  Component Value Date   HGBA1C 4.8 05/06/2012     Lab Results  Component Value Date   TSH 2.775 05/06/2012    Hepatic Function Panel  Basename 05/06/12 0439  PROT 6.2  ALBUMIN 2.2*  AST 17  ALT 5  ALKPHOS 72  BILITOT 0.3  BILIDIR --  IBILI --    Basename 05/06/12 0439  CHOL 91    Studies/Results: Dg Chest Port 1 View  05/05/2012  *RADIOLOGY REPORT*  Clinical Data:  Chest pain  PORTABLE CHEST - 1 VIEW  Comparison: Portable exam 2120 hours compared to 04/21/2012  Findings: Right jugular dual-lumen central venous catheter tip projecting over right atrium. Left subclavian transvenous pacemaker / AICD leads project over right atrium, right ventricle, and coronary sinus. Enlargement of cardiac silhouette post  CABG. Pulmonary vascular ingestion. Mild perihilar infiltrates suspect edema. No gross pleural effusion or pneumothorax.  IMPRESSION: Enlargement of cardiac silhouette with pulmonary vascular congestion post CABG and pacemaker / AICD. Probable perihilar edema.   Original Report Authenticated By: Ulyses Southward, M.D.    Cardiac Cath: Coronary Calcification  LM: nl  LAD: ostial prox 70%, patent proximal LAD stent  RI: 50 - 60% proximal and mid, 80% OM1  RCA: 80% proximal, 60 and 90% ostial RV branches  SVG to RCA: patent  SVG to LCX OM: patent  SVG to DX: appears moderately atretic extends to DX which is very small  LIMA to LAD: patent with 60% LAD disease post anastomosis  EF 10% with dilated severely hypocontractile LV.     Medications: I have reviewed the patient's current medications.    [name] aspirin EC  325 mg Oral Daily  . atorvastatin  40 mg Oral q1800  . carvedilol  3.125 mg Oral BID WC  . ciprofloxacin  500 mg Oral Daily  . darbepoetin (ARANESP) injection - DIALYSIS  150 mcg Intravenous Q Sat-HD  . feeding supplement (NEPRO CARB STEADY)  237 mL Oral BID BM  . [COMPLETED] fentaNYL      . [COMPLETED] fentaNYL      . [COMPLETED] heparin      . [COMPLETED] heparin  1,500 Units Intravenous Once  . hydrALAZINE  12.5 mg Oral Q8H  . insulin aspart  0-9 Units Subcutaneous TID WC  . isosorbide mononitrate  30 mg Oral Daily  . [COMPLETED] lidocaine      . methadone  5 mg Oral TID  . [COMPLETED] midazolam      . [COMPLETED] midazolam      . multivitamin  1 tablet Oral Daily  . [COMPLETED] nitroGLYCERIN      . pantoprazole  40 mg Oral Daily  . senna  1 tablet Oral BID  . sevelamer  800 mg Oral TID WC  . sodium chloride  3 mL Intravenous Q12H  . [DISCONTINUED] atorvastatin  80 mg Oral q1800  . [DISCONTINUED] feeding supplement (NEPRO CARB STEADY)  237 mL Oral BID WC  . [DISCONTINUED] heparin  1,500 Units Intravenous Once  . [COMPLETED] potassium chloride  10 mEq Intravenous Once    Assessment/Plan: Principal Problem:  *NSTEMI (non-ST elevated myocardial infarction) Active Problems:  Ischemic cardiomyopathy, EF 20-25% echo April 2013, now 10% by cath (05/06/12)  Chronic systolic congestive heart failure, NYHA class 3  LBBB (left bundle branch block)  Hypertension  Pancreatic pseudocyst  Pancreatitis, chronic  Anemia in chronic kidney disease (CKD)  Esophageal varices in cirrhosis, admitted June 2013 to [facility]  ESRD on hemodialysis  CAD, CABG X 4 Feb '08, LAD stent June '08  NSVT 8-10bts 05/06/12  ICD (implantable cardiac defibrillator) in place, MDT placed Jan 2013 [facility]  PLAN: anemic, elevated WBC,  plts up somewhat.  NTG at 1.5 --febrile.  LOS: 2 days   INGOLD,LAURA R 05/07/2012, 8:05 AM

## 2012-05-07 NOTE — Procedures (Signed)
I was present at this dialysis session. I have reviewed the session itself and made appropriate changes.   Rob Cherylene Ferrufino, MD [facility] 05/07/2012, 9:47 AM   

## 2012-05-08 DIAGNOSIS — R079 Chest pain, unspecified: Secondary | ICD-10-CM

## 2012-05-08 DIAGNOSIS — I10 Essential (primary) hypertension: Secondary | ICD-10-CM

## 2012-05-08 LAB — GLUCOSE, CAPILLARY
Glucose-Capillary: 186 mg/dL — ABNORMAL HIGH (ref 70–99)
Glucose-Capillary: 124 mg/dL — ABNORMAL HIGH (ref 70–99)
Glucose-Capillary: 133 mg/dL — ABNORMAL HIGH (ref 70–99)

## 2012-05-08 LAB — COMPREHENSIVE METABOLIC PANEL
ALT: 5 U/L (ref 0–35)
AST: 12 U/L (ref 0–37)
Alkaline Phosphatase: 94 U/L (ref 39–117)
Calcium: 10.9 mg/dL — ABNORMAL HIGH (ref 8.4–10.5)
GFR calc Af Amer: 21 mL/min — ABNORMAL LOW (ref >90)
Glucose, Bld: 107 mg/dL — ABNORMAL HIGH (ref 70–99)
Potassium: 4.1 mEq/L (ref 3.5–5.1)
Sodium: 132 mEq/L — ABNORMAL LOW (ref 135–145)
Total Protein: 6.7 g/dL (ref 6.0–8.3)

## 2012-05-08 LAB — CBC
Hemoglobin: 8.1 g/dL — ABNORMAL LOW (ref 12.0–15.0)
MCHC: 29.7 g/dL — ABNORMAL LOW (ref 30.0–36.0)
Platelets: DECREASED 10*3/uL (ref 150–400)
RBC: 2.96 MIL/uL — ABNORMAL LOW (ref 3.87–5.11)

## 2012-05-08 LAB — TROPONIN I
Troponin I: <0.3 ng/mL (ref <0.30)
Troponin I: <0.3 ng/mL (ref <0.30)

## 2012-05-08 MED ORDER — HYDRALAZINE HCL 25 MG PO TABS
25.0000 mg | ORAL_TABLET | Freq: Three times a day (TID) | ORAL | Status: DC
  Administered 2012-05-08 – 2012-05-10 (×8): 25 mg via ORAL
  Filled 2012-05-08 (×13): qty 1

## 2012-05-08 MED ORDER — CARVEDILOL 6.25 MG PO TABS
9.3750 mg | ORAL_TABLET | Freq: Two times a day (BID) | ORAL | Status: DC
  Administered 2012-05-08 – 2012-05-10 (×4): 9.375 mg via ORAL
  Filled 2012-05-08 (×8): qty 1

## 2012-05-08 NOTE — Progress Notes (Signed)
Subjective:  [name] is doing well this morning. No chest pain. She states that she will call to reschedule her vascular surgery appointment to address her L arm swelling after AVG placement.  She denies chest pain, SOB, fever, chills. She does have some chronic abdominal pain.  Objective: Vital signs in last 24 hours: Filed Vitals:   05/08/12 0000 05/08/12 0304 05/08/12 0654 05/08/12 0737  BP:  126/74 119/77   Pulse: 80 91 100   Temp:  100.4 F (38 C)  99.5 F (37.5 C)  TempSrc:  Oral  Oral  Resp: 18 7 24    Height:      Weight:  118 lb 6.2 oz (53.7 kg)    SpO2: 94% 96% 95%    Weight change: 0 lb (0 kg)  Intake/Output Summary (Last 24 hours) at 05/08/12 0833 Last data filed at 05/08/12 0600  Gross per 24 hour  Intake  997.5 ml  Output   2926 ml  Net -1928.5 ml    Physical Exam Blood pressure 119/77, pulse 100, temperature 99.5 F (37.5 C), temperature source Oral, resp. rate 24, height 5\' 4"  (1.626 m), weight 118 lb 6.2 oz (53.7 kg), SpO2 95.00%. General:  No acute distress, alert and oriented x 3, well-appearing  HEENT:  PERRL, EOMI, moist mucous membranes Cardiovascular:  Regular rate and rhythm, systolic murmur LSB Chest: HD cath in place Respiratory:  clear, no rhonchi. Abdomen:  Soft, nondistended, diffusely tender to palpation, chronic, bowel sounds present Extremities:  Warm and well-perfused, no LE edema.  LUE with AVF, nonpitting edema throughout whole extremity. RUE wnl. Skin: Warm, dry, no rashes Neuro: Not anxious appearing, no depressed mood, normal affect  Lab Results: Basic Metabolic Panel:  Lab 05/08/12 1610 05/07/12 0648 05/06/12 0439  NA 132* 131* --  K 4.1 3.9 --  CL 96 95* --  CO2 29 27 --  GLUCOSE 107* 141* --  BUN 15 19 --  CREATININE 2.67* 3.98* --  CALCIUM 10.9* 10.4 --  MG -- -- 2.3  PHOS -- -- 3.4   Liver Function Tests:  Lab 05/08/12 0515 05/06/12 0439  AST 12 17  ALT 5 5  ALKPHOS 94 72  BILITOT 0.3 0.3  PROT 6.7 6.2    ALBUMIN 2.2* 2.2*   CBC:  Lab 05/08/12 0515 05/07/12 0648  WBC 11.7* 14.5*  NEUTROABS -- --  HGB 8.1* 8.7*  HCT 27.3* 30.1*  MCV 92.2 92.3  PLT PLATELET CLUMPS NOTED ON SMEAR, COUNT APPEARS DECREASED 131*   Cardiac Enzymes:  Lab 05/06/12 2350 05/06/12 1637 05/06/12 0833  CKTOTAL -- -- --  CKMB -- -- --  CKMBINDEX -- -- --  TROPONINI 0.73* 0.60* 1.36*   Fasting Lipid Panel:  Lab 05/06/12 0439  CHOL 91  HDL 46  LDLCALC 36  TRIG 44  CHOLHDL 2.0  LDLDIRECT --     Studies/Results: No results found. Medications: I have reviewed the patient's current medications. Scheduled Meds:    . aspirin EC  81 mg Oral Daily  . atorvastatin  40 mg Oral q1800  . carvedilol  6.25 mg Oral BID WC  . ciprofloxacin  500 mg Oral Daily  . [COMPLETED] darbepoetin      . darbepoetin (ARANESP) injection - DIALYSIS  150 mcg Intravenous Q Sat-HD  . feeding supplement (NEPRO CARB STEADY)  237 mL Oral BID BM  . hydrALAZINE  12.5 mg Oral Q8H  . insulin aspart  0-9 Units Subcutaneous TID WC  . isosorbide mononitrate  30  mg Oral Daily  . methadone  5 mg Oral TID  . multivitamin  1 tablet Oral Daily  . [COMPLETED] ondansetron      . ondansetron  4 mg Intravenous Once  . pantoprazole  40 mg Oral Daily  . senna  1 tablet Oral BID  . sevelamer  800 mg Oral TID WC  . sodium chloride  3 mL Intravenous Q12H   Continuous Infusions:    . sodium chloride Stopped (05/06/12 1306)  . sodium chloride 30 mL/hr at 05/06/12 1630  . nitroGLYCERIN Stopped (05/06/12 1306)   PRN Meds:.sodium chloride, acetaminophen, acetaminophen, ALPRAZolam, calcium carbonate (dosed in mg elemental calcium), camphor-menthol, docusate sodium, feeding supplement (NEPRO CARB STEADY), HYDROmorphone (DILAUDID) injection, hydrOXYzine, ondansetron (ZOFRAN) IV, sorbitol, zolpidem, [DISCONTINUED] sodium chloride, [DISCONTINUED] sodium chloride, [DISCONTINUED] alteplase, [DISCONTINUED] heparin, [DISCONTINUED] heparin, [DISCONTINUED]  lidocaine [DISCONTINUED] lidocaine-prilocaine, [DISCONTINUED] pentafluoroprop-tetrafluoroeth  Assessment/Plan:  [name] is a 61 year old female with PMH of ESRD on HD, chronic systolic CHF (Echo 09/2011 EF 20-25%), s/p CABG 2008, HTN, DMII admitted for chest pain.   NSTEMI Patient has a hx of Chronic systolic CHF with Echo in 09/2011 EF 20-25%, s/p CABG 2008, pacemaker in place. Troponins: 0.08-->0.42-->1.3. EKG on admission with no obvious signs of ST changes on EKG and patient has ESRD. TIMI score 4 risk 20%. CXR on admission with enlargement of cardiac silhouette, pulmonary vascular congestion, probable perihilar edema, no gross pleural effusion or pneumothorax. Cardiology following. Cath 11/22: with lesions in LAD, OM, RCA, and 2/4 grafts. Est EF was 10% with hypokinesis and dilation of LV Troponins trending down  -maximal medical therapy for CAD: increase coreg to 6.25 BID, hydralazine  -ASA, statin  Ischemic Cardiomyopathy- Chronic CHF NYHA class 3: Echo in 09/2011 EF 20-25%, s/p CABG 2008 with Cath 11/2006--3 vessel disease including stenosis of LAD-LIMA (stent in 2008), stenosis of SVG to diagonal (not stented), pacemaker in place. Followed at [facility] where pacemaker was placed. Was on home Ranexa 500mg  qd.  EF from cath 11/23 with EF 10%  -ECHO still pending -cont increased coreg dose, cont ASA, imdur -hydralazine 25mg  BID -monitor BP  Leukocytosis 11/23: WBC increased to 14.5 today from 8. May be stress reaction from cardiac cath yesterday vs. SCP vs UTI 11/24: WBC trending down, no infectious symptoms, Tm 100.4 overnight -bl and urine cx  -follow CBC  ESRD on hemodialysis T, Th, Sat. Had HD on 05/05/12. Followed by [facility]. Cr 2.62 on admission. GFR: 21.  -will need Tuesday HD  # Hx of Cirrhosis with Ascites  Pt states she is s/p esophageal variceal banding procedure, though we do not have records of that. Last EGD with abnormal mass and gastritis, but no varices  noted. She states she is followed at [facility] where she undergoes intermittent abdominal paracentesis usually every 2 weeks. Last drainage was one week ago. Recently treated with Flagyl/Cipro with concern for SBP. -cont cipro  Hypertension--BP on admission: 108/69. Noted to be hypotensive during HD. Is on Midodrine 5mg  BID and Coreg 3.125mg  BID at home. Midodrine discontinued. -hydralazine, coreg as above,   Anemia of chronic kidney disease (CKD)--ESRD on HD. Hb 8.6 on admission, baseline ~7.  -daily CBC  -continue to monitor, transfuse as needed with HD  Diabetes Mellitus II--well controlled, ESRD on HD. Last HbA1c: 5.7 09/2011. On no diabetic medication at home.   -CBG monitoring  -SSI sensitive  -continue to monitor   Chronic pancreatitis -cont home methadone  Nutrition: hypoalbuminemia with Albumin 2.2 -renal diet  with supplementation  DVT Ppx: refusing SCDs -encourage use to prevent DVTs  Dispo:  -will need renal f/u, reschedule vascular appt  -The patient does have a current PCP (ARMOUR, ROSS B, MD), therefore will not be requiring OPC follow-up after discharge.  -The patient does not have transportation limitations that hinder transportation to clinic appointments.    LOS: 3 days   [name] 05/08/2012, 8:33 AM

## 2012-05-08 NOTE — Progress Notes (Signed)
INTERNAL MEDICINE TEACHING SERVICE Attending Note  Date: 05/08/2012  Patient name: [name]  Medical record number: [id number]  Date of birth: 10-18-1950    This patient has been seen and discussed with the house staff. Please see their note for complete details. I concur with their findings with the following additions/corrections:  A/P: 61 yr. 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 SBP, presented with CP and found to have NSTEMI.  1) NSTEM/chronic systolic heart failure: s/p LHC. EF 10%.  Await echo review by cards. Await their final recommendations on titration of meds. Needs PT/OT eval. 2) ESRD: Nephrology following.  3) Type 2 DM: Cont home meds. 4) recent UTI/SBP: On Cipro. She will need follow up, no abdominal complaints this admission.  5) HTN: Coreg, hydralazine. Controlled. 6) Anemia chronic disease: Nephro to follow/EPO as needed to maintain goal Hgb.  7) rest per resident note. 8) Likely D/C tomorrow. PT/ OT.  Jonah Blue, DO  05/08/2012, 11:32 AM

## 2012-05-08 NOTE — Progress Notes (Signed)
  Echocardiogram 2D Echocardiogram has been performed.  Shelley Pooley, [facility] 05/08/2012, 11:55 AM

## 2012-05-08 NOTE — Progress Notes (Signed)
Subjective: Cardiology is adjusting meds, wants her to stay another day per patient. No complaints  Objective Vital signs in last 24 hours: Filed Vitals:   05/08/12 0654 05/08/12 0655 05/08/12 0737 05/08/12 1507  BP: 119/77 119/77  114/59  Pulse: 100 98    Temp:   99.5 F (37.5 C)   TempSrc:   Oral   Resp: 24 19  17   Height:      Weight:      SpO2: 95% 92% 97% 98%   Weight change: 0 kg (0 lb)  Intake/Output Summary (Last 24 hours) at 05/08/12 1650 Last data filed at 05/08/12 1400  Gross per 24 hour  Intake   1200 ml  Output      0 ml  Net   1200 ml   Labs: Basic Metabolic Panel:  Lab 05/08/12 1324 05/07/12 0648 05/06/12 1841 05/06/12 0838 05/06/12 0439 05/05/12 2058  NA 132* 131* 131* 137 133* 134*  K 4.1 3.9 4.0 3.5 3.3* 2.8*  CL 96 95* 95* 98 95* 95*  CO2 29 27 29  33* 31 30  GLUCOSE 107* 141* 194* 80 113* 160*  BUN 15 19 12 10 10 9   CREATININE 2.67* 3.98* 3.34* 3.23* 3.03* 2.62*  ALB -- -- -- -- -- --  CALCIUM 10.9* 10.4 10.0 9.6 9.2 8.8  PHOS -- -- -- -- 3.4 --   Liver Function Tests:  Lab 05/08/12 0515 05/06/12 0439  AST 12 17  ALT 5 5  ALKPHOS 94 72  BILITOT 0.3 0.3  PROT 6.7 6.2  ALBUMIN 2.2* 2.2*   No results found for this basename: LIPASE:3,AMYLASE:3 in the last 168 hours No results found for this basename: AMMONIA:3 in the last 168 hours CBC:  Lab 05/08/12 0515 05/07/12 0648 05/06/12 0838 05/06/12 0439  WBC 11.7* 14.5* 8.0 6.8  NEUTROABS -- -- -- --  HGB 8.1* 8.7* 8.4* 8.1*  HCT 27.3* 30.1* 28.5* 27.4*  MCV 92.2 92.3 91.6 92.6  PLT PLATELET CLUMPS NOTED ON SMEAR, COUNT APPEARS DECREASED 131* 100* 107*   PT/INR: @labrcntip (inr:5) Cardiac Enzymes:  Lab 05/08/12 1340 05/06/12 2350 05/06/12 1637 05/06/12 0833 05/06/12 0439  CKTOTAL -- -- -- -- --  CKMB -- -- -- -- --  CKMBINDEX -- -- -- -- --  TROPONINI <0.30 0.73* 0.60* 1.36* 1.39*   CBG:  Lab 05/08/12 1221 05/08/12 0738 05/07/12 2122 05/07/12 1643 05/07/12 0732  GLUCAP 124* 186* 105*  192* 135*    Iron Studies: No results found for this basename: IRON:30,TIBC:30,TRANSFERRIN:30,FERRITIN:30 in the last 168 hours  Physical Exam:  Blood pressure 114/59, pulse 98, temperature 99.5 F (37.5 C), temperature source Oral, resp. rate 17, height 5\' 4"  (1.626 m), weight 53.7 kg (118 lb 6.2 oz), SpO2 98.00%.  General: Talkative, slender sitting in bed post heart cath; in no acute distress.  Head: Normocephalic, atraumatic, sclera non-icteric,wearing glasses, mucus membranes are moist (dentures at home)  Neck: Supple. Prominent neck veins; right I-J catheter  Lungs: bibasilar crackles R>L  Heart: RRR with S1 S2. 2/6 murmur  Abdomen: Soft, non-tender, non-distended with normoactive bowel sounds. No rebound/guarding. No obvious abdominal masses. Midline scar  Lower extremities: tr edema or ischemic changes, no open wounds  Neuro: Alert and oriented X 3. Moves all extremities spontaneously.  Psych: Responds to questions appropriately with a normal affect.  Dialysis Access: right I-J active; maturing left lower arm AVGG swelling improving-very little edema in fingers, but still in full arm.   Dialysis Orders: Center: [facility] On TTS- transferred  there 05/03/2012;  EDW 53.5 HD Bath 2K 2.25 Ca Optiflux 180 Time 4 Heparin 1600. Access left forearm AVGG placed 10/25 Dr. Hollace Hayward - not used yet; right I-J active BFR 400 DFR 800 Zemplar none IV/HD Epogen 15,000 Units IV/HD No IV Fe   Assessment/Plan:  1. NSTEMI with hx CABG 2008/severe cardiomyopathy w EF 10%, hx of multiple PCIs- s/p cath 11/23, plan is for medical Rx per cardiology 2. ESRD TTS [facility] with post dialysis hypokalemia upon admission- received small dose KCl; she has been on a 2 K bath and obviously not eating enough to warrant this. Will use 4 K here and will need 3 or 4 K bath at discharge.; titrate EDW as tolerated due to edema. Access swelling likely due to central stenosis somewhere. Has left sided ICD- she says she has an appt to f/u  with her vascular surgeon Monday the 25th. HD Monday on holiday schedule 3. Hypertension/volume - BP controlled; titrating EDW; CXR cites pul vasc congestion; large IDWG. Attempt to lower EDW while here. 2.9 kg off Sat with HD, she is right at her dry wt. 4. Anemia - Hgb down 8.1. Aranesp at 150. May need transfusion if continue to drop. 5. Metabolic bone disease - iTPH drawn 11/21; not on Zemplar and no iPTH levels available from her prior HD center; resume renvela 800 1 ac and check P with pre HD labs. 6. Nutrition - add nepro supplements; needs to be started on oral nutritional supplmenet program (ONSP) at hr HD center after discharge. 7. Hyperlipidemia - on statin 8. DM - no meds needed at this time. 9. Code status/DNR - Dr Eliott Nine signed DNR papers at her dialysis center last week after discussion with patient. She wishes to continue DNR Status in the hospital.    Vinson Moselle  MD [facility] [phone] pgr    [phone] cell 05/08/2012, 4:50 PM

## 2012-05-08 NOTE — Progress Notes (Signed)
[facility] Progress Note  Subjective:  No recurrent chest pain  Objective:   Vital Signs in the last 24 hours: Temp:  [98 F (36.7 C)-100.4 F (38 C)] 99.5 F (37.5 C) (11/24 0737) Pulse Rate:  [80-100] 98  (11/24 0655) Resp:  [7-34] 19  (11/24 0655) BP: (98-139)/(58-92) 119/77 mmHg (11/24 0655) SpO2:  [92 %-98 %] 97 % (11/24 0737) Weight:  [52.7 kg (116 lb 2.9 oz)-53.7 kg (118 lb 6.2 oz)] 53.7 kg (118 lb 6.2 oz) (11/24 0304)  Intake/Output from previous day: 11/23 0701 - 11/24 0700 In: 1029 [P.O.:840; I.V.:189] Out: 2926   Scheduled:   . aspirin EC  81 mg Oral Daily  . atorvastatin  40 mg Oral q1800  . carvedilol  6.25 mg Oral BID WC  . ciprofloxacin  500 mg Oral Daily  . [COMPLETED] darbepoetin      . darbepoetin (ARANESP) injection - DIALYSIS  150 mcg Intravenous Q Sat-HD  . feeding supplement (NEPRO CARB STEADY)  237 mL Oral BID BM  . hydrALAZINE  12.5 mg Oral Q8H  . insulin aspart  0-9 Units Subcutaneous TID WC  . isosorbide mononitrate  30 mg Oral Daily  . methadone  5 mg Oral TID  . multivitamin  1 tablet Oral Daily  . [COMPLETED] ondansetron      . ondansetron  4 mg Intravenous Once  . pantoprazole  40 mg Oral Daily  . senna  1 tablet Oral BID  . sevelamer  800 mg Oral TID WC  . sodium chloride  3 mL Intravenous Q12H    Physical Exam:   General appearance: alert, cooperative and no distress Neck: Mild JVD Lungs: decreased BS at bases Heart: S1, S2 normal and 1/6 SEM Abdomen: soft, non-tender; bowel sounds normal; no masses,  no organomegaly Extremities: no edema, redness or tenderness in the calves or thighs   Rate: 95  Rhythm: normal sinus rhythm  Lab Results:    Basename 05/08/12 0515 05/07/12 0648  NA 132* 131*  K 4.1 3.9  CL 96 95*  CO2 29 27  GLUCOSE 107* 141*  BUN 15 19  CREATININE 2.67* 3.98*    Basename 05/06/12 2350 05/06/12 1637  TROPONINI 0.73* 0.60*   Cardiac Panel (last 3 results)  Basename  05/06/12 2350 05/06/12 1637 05/06/12 0833  CKTOTAL -- -- --  CKMB -- -- --  TROPONINI 0.73* 0.60* 1.36*  RELINDX -- -- --   Hepatic Function Panel  Basename 05/08/12 0515  PROT 6.7  ALBUMIN 2.2*  AST 12  ALT 5  ALKPHOS 94  BILITOT 0.3  BILIDIR --  IBILI --    Basename 05/06/12 0838  INR 1.42    Lipid Panel     Component Value Date/Time   CHOL 91 05/06/2012 0439   TRIG 44 05/06/2012 0439   HDL 46 05/06/2012 0439   CHOLHDL 2.0 05/06/2012 0439   VLDL 9 05/06/2012 0439   LDLCALC 36 05/06/2012 0439     Imaging:  No results found.    Assessment/Plan:   Principal Problem:  *NSTEMI (non-ST elevated myocardial infarction) Active Problems:  Ischemic cardiomyopathy, EF 20-25% echo April 2013, now 10% by cath (05/06/12)  Chronic systolic congestive heart failure, NYHA class 3  LBBB (left bundle branch block)  Hypertension  Pancreatic pseudocyst  Pancreatitis, chronic  Anemia in chronic kidney disease (CKD)  Esophageal varices in cirrhosis, admitted June 2013 to [facility]  ESRD on hemodialysis  CAD, CABG X 4 Feb '08, LAD  stent June '08  NSVT 8-10bts 05/06/12  ICD (implantable cardiac defibrillator) in place, MDT placed Jan 2013 [facility]  Echo done this am; will review.  S/P dialysis yesterday.  Will continue to titrate cardiac meds with severe ischemic cardiomyopathy. Ideally keep in hospital and reschedule outpatient vascular surgical f/u at later date.    Lennette Bihari, MD, [facility] 05/08/2012, 9:03 AM

## 2012-05-09 DIAGNOSIS — I509 Heart failure, unspecified: Secondary | ICD-10-CM

## 2012-05-09 DIAGNOSIS — I5022 Chronic systolic (congestive) heart failure: Secondary | ICD-10-CM

## 2012-05-09 LAB — BASIC METABOLIC PANEL
CO2: 27 mEq/L (ref 19–32)
Calcium: 11.3 mg/dL — ABNORMAL HIGH (ref 8.4–10.5)
Creatinine, Ser: 3.39 mg/dL — ABNORMAL HIGH (ref 0.50–1.10)
GFR calc non Af Amer: 14 mL/min — ABNORMAL LOW (ref >90)
Sodium: 129 mEq/L — ABNORMAL LOW (ref 135–145)

## 2012-05-09 LAB — GLUCOSE, CAPILLARY
Glucose-Capillary: 199 mg/dL — ABNORMAL HIGH (ref 70–99)
Glucose-Capillary: 124 mg/dL — ABNORMAL HIGH (ref 70–99)

## 2012-05-09 LAB — URINE CULTURE: Colony Count: >=100000

## 2012-05-09 LAB — CBC
MCH: 28.4 pg (ref 26.0–34.0)
MCHC: 30.6 g/dL (ref 30.0–36.0)
MCV: 92.7 fL (ref 78.0–100.0)
Platelets: 98 10*3/uL — ABNORMAL LOW (ref 150–400)
RBC: 2.75 MIL/uL — ABNORMAL LOW (ref 3.87–5.11)
RDW: 19.4 % — ABNORMAL HIGH (ref 11.5–15.5)

## 2012-05-09 MED ORDER — ALTEPLASE 2 MG IJ SOLR
2.0000 mg | Freq: Once | INTRAMUSCULAR | Status: DC | PRN
  Filled 2012-05-09: qty 2

## 2012-05-09 MED ORDER — SODIUM CHLORIDE 0.9 % IV SOLN: 100.0000 mL | INTRAVENOUS | Status: DC | PRN

## 2012-05-09 MED ORDER — HEPARIN SODIUM (PORCINE) 1000 UNIT/ML DIALYSIS: 1600.0000 [IU] | INTRAMUSCULAR | Status: DC | PRN

## 2012-05-09 MED ORDER — LIDOCAINE HCL (PF) 1 % IJ SOLN: 5.0000 mL | INTRAMUSCULAR | Status: DC | PRN

## 2012-05-09 MED ORDER — LIDOCAINE-PRILOCAINE 2.5-2.5 % EX CREA
1.0000 "application " | TOPICAL_CREAM | CUTANEOUS | Status: DC | PRN
  Filled 2012-05-09: qty 5

## 2012-05-09 MED ORDER — HEPARIN SODIUM (PORCINE) 1000 UNIT/ML DIALYSIS: 1000.0000 [IU] | INTRAMUSCULAR | Status: DC | PRN

## 2012-05-09 MED ORDER — NEPRO/CARBSTEADY PO LIQD
237.0000 mL | ORAL | Status: DC | PRN
  Filled 2012-05-09: qty 237

## 2012-05-09 MED ORDER — HYDROMORPHONE HCL PF 1 MG/ML IJ SOLN
INTRAMUSCULAR | Status: AC
  Administered 2012-05-10: 0.5 mg via INTRAVENOUS
  Filled 2012-05-09: qty 1

## 2012-05-09 MED ORDER — PENTAFLUOROPROP-TETRAFLUOROETH EX AERO: 1.0000 "application " | INHALATION_SPRAY | CUTANEOUS | Status: DC | PRN

## 2012-05-09 NOTE — Progress Notes (Signed)
Subjective:  [name] was seen in HD today. No events overnight. Denies chest pain.   She denies SOB, fever, chills. She does have some chronic abdominal pain.  Objective: Vital signs in last 24 hours: Filed Vitals:   05/09/12 1300 05/09/12 1400 05/09/12 1445 05/09/12 1500  BP: 110/60 109/59 109/59 109/59  Pulse: 96 93  92  Temp:      TempSrc:      Resp: 22 22  18   Height:      Weight:      SpO2: 100% 99%  97%   Weight change: -10.6 oz (-0.3 kg)  Intake/Output Summary (Last 24 hours) at 05/09/12 1533 Last data filed at 05/09/12 1229  Gross per 24 hour  Intake   1093 ml  Output   2050 ml  Net   -957 ml    Physical Exam Blood pressure 109/59, pulse 92, temperature 98.4 F (36.9 C), temperature source Oral, resp. rate 18, height 5\' 4"  (1.626 m), weight 116 lb 13.5 oz (53 kg), SpO2 97.00%. General:  No acute distress, alert and oriented x 3, well-appearing  HEENT:  PERRL, EOMI, moist mucous membranes Cardiovascular:  Regular rate and rhythm, systolic murmur LSB Chest: HD cath in place Respiratory:  clear, no rhonchi. Abdomen:  Soft, nondistended, diffusely tender to palpation, chronic, bowel sounds present Extremities:  Warm and well-perfused, no LE edema.  LUE with AVF, nonpitting edema throughout whole extremity. RUE wnl. Skin: Warm, dry, no rashes Neuro: Not anxious appearing, no depressed mood, normal affect  Lab Results: Basic Metabolic Panel:  Lab 05/09/12 6440 05/08/12 0515 05/06/12 0439  NA 129* 132* --  K 4.7 4.1 --  CL 94* 96 --  CO2 27 29 --  GLUCOSE 207* 107* --  BUN 26* 15 --  CREATININE 3.39* 2.67* --  CALCIUM 11.3* 10.9* --  MG -- -- 2.3  PHOS -- -- 3.4   Liver Function Tests:  Lab 05/08/12 0515 05/06/12 0439  AST 12 17  ALT 5 5  ALKPHOS 94 72  BILITOT 0.3 0.3  PROT 6.7 6.2  ALBUMIN 2.2* 2.2*   CBC:  Lab 05/09/12 0037 05/08/12 0515  WBC 7.8 11.7*  NEUTROABS -- --  HGB 7.8* 8.1*  HCT 25.5* 27.3*  MCV 92.7 92.2  PLT 98* PLATELET  CLUMPS NOTED ON SMEAR, COUNT APPEARS DECREASED   Cardiac Enzymes:  Lab 05/09/12 0031 05/08/12 1906 05/08/12 1340  CKTOTAL -- -- --  CKMB -- -- --  CKMBINDEX -- -- --  TROPONINI <0.30 <0.30 <0.30   Fasting Lipid Panel:  Lab 05/06/12 0439  CHOL 91  HDL 46  LDLCALC 36  TRIG 44  CHOLHDL 2.0  LDLDIRECT --     Studies/Results: No results found. Medications: I have reviewed the patient's current medications. Scheduled Meds:    . aspirin EC  81 mg Oral Daily  . atorvastatin  40 mg Oral q1800  . carvedilol  9.375 mg Oral BID WC  . ciprofloxacin  500 mg Oral Daily  . darbepoetin (ARANESP) injection - DIALYSIS  150 mcg Intravenous Q Sat-HD  . feeding supplement (NEPRO CARB STEADY)  237 mL Oral BID BM  . hydrALAZINE  25 mg Oral Q8H  . HYDROmorphone      . insulin aspart  0-9 Units Subcutaneous TID WC  . isosorbide mononitrate  30 mg Oral Daily  . methadone  5 mg Oral TID  . multivitamin  1 tablet Oral Daily  . ondansetron  4 mg Intravenous Once  .  pantoprazole  40 mg Oral Daily  . senna  1 tablet Oral BID  . sevelamer  800 mg Oral TID WC  . sodium chloride  3 mL Intravenous Q12H   Continuous Infusions:    . sodium chloride Stopped (05/06/12 1306)  . sodium chloride 30 mL/hr at 05/06/12 1630  . nitroGLYCERIN Stopped (05/06/12 1306)   PRN Meds:.sodium chloride, acetaminophen, acetaminophen, ALPRAZolam, calcium carbonate (dosed in mg elemental calcium), camphor-menthol, docusate sodium, feeding supplement (NEPRO CARB STEADY), HYDROmorphone (DILAUDID) injection, hydrOXYzine, ondansetron (ZOFRAN) IV, sorbitol, zolpidem, [DISCONTINUED] sodium chloride, [DISCONTINUED] sodium chloride, [DISCONTINUED] alteplase, [DISCONTINUED] feeding supplement (NEPRO CARB STEADY), [DISCONTINUED] heparin [DISCONTINUED] heparin, [DISCONTINUED] lidocaine, [DISCONTINUED] lidocaine-prilocaine, [DISCONTINUED] pentafluoroprop-tetrafluoroeth  Assessment/Plan:  [name] is a 61 year old female with PMH  of ESRD on HD, chronic systolic CHF (Echo 09/2011 EF 20-25%), s/p CABG 2008, HTN, DMII admitted for chest pain.   Ischemic Cardiomyopathy- Chronic CHF NYHA class 3: Echo in 09/2011 EF 20-25%, s/p CABG 2008 with Cath 11/2006--3 vessel disease including stenosis of LAD-LIMA (stent in 2008), stenosis of SVG to diagonal (not stented), pacemaker in place. Followed at [facility] where pacemaker was placed. Was on home Ranexa 500mg  qd.  EF from cath 11/23 with EF 10%. ECHO confirmed EF 10%, severe global hypokinesis, aortic sclerosis, mod-sev MR, severe LA dilation  -cardiology following -cont coreg 6.25, cont ASA, imdur -hydralazine 25mg  BID -monitor BP  NSTEMI: resolved Patient has a hx of Chronic systolic CHF with Echo in 09/2011 EF 20-25%, s/p CABG 2008, pacemaker in place. Troponins: 0.08-->0.42-->1.3. EKG on admission with no obvious signs of ST changes on EKG and patient has ESRD. TIMI score 4 risk 20%. CXR on admission with enlargement of cardiac silhouette, pulmonary vascular congestion, probable perihilar edema, no gross pleural effusion or pneumothorax. Cardiology following. Cath 11/22: with lesions in LAD, OM, RCA, and 2/4 grafts. Est EF was 10% with hypokinesis and dilation of LV Troponins trending down  -maximal medical therapy for CAD: cont coreg 6.25 BID, hydralazine  -ASA, statin  Leukocytosis 11/23: WBC increased to 14.5 today from 8. May be stress reaction from cardiac cath yesterday vs. SCP vs UTI 11/24: WBC trending down, no infectious symptoms, Tm 100.4 overnight -bl and urine cx  -follow CBC  ESRD on hemodialysis T, Th, Sat. Had HD on 05/05/12. Followed by [facility]. Cr 2.62 on admission. GFR: 21.  -will need Tuesday HD -LLE swelling likely due to stenosis, will need imaging. Reschedule vascular appt  # Hx of Cirrhosis with Ascites  Pt states she is s/p esophageal variceal banding procedure, though we do not have records of that. Last EGD with abnormal mass and  gastritis, but no varices noted. She states she is followed at [facility] where she undergoes intermittent abdominal paracentesis usually every 2 weeks. Last drainage was one week ago. Recently treated with Flagyl/Cipro with concern for SBP. -completed cipro course for suspected SBP  Hypertension--BP on admission: 108/69. Noted to be hypotensive during HD. Is on Midodrine 5mg  BID and Coreg 3.125mg  BID at home. Midodrine discontinued. -hydralazine, coreg as above,   Anemia of chronic kidney disease (CKD)--ESRD on HD. Hb 8.6 on admission, baseline ~7.  -daily CBC  -continue to monitor, transfuse as needed with HD  Diabetes Mellitus II--well controlled, ESRD on HD. Last HbA1c: 5.7 09/2011. On no diabetic medication at home.   -CBG monitoring  -SSI sensitive  -continue to monitor   Chronic pancreatitis -cont home methadone  Nutrition: hypoalbuminemia with Albumin 2.2 -renal diet with supplementation  DVT Ppx: refusing SCDs -encourage use to prevent DVTs  Dispo:  -transfer to floor today -will need renal and cards f/u, reschedule vascular appt  -The patient does have a current PCP (ARMOUR, ROSS B, MD), therefore will not be requiring OPC follow-up after discharge.  -The patient does not have transportation limitations that hinder transportation to clinic appointments.    LOS: 4 days   [place] Ar 05/09/2012, 3:33 PM

## 2012-05-09 NOTE — Progress Notes (Signed)
[facility]  DAILY PROGRESS NOTE   Subjective:  No events overnight. Chest pain is improved. At dialysis currently, plan for ~3L of UF today +/- tranfusion.   Objective:  Temp:  [98 F (36.7 C)-100.2 F (37.9 C)] 98.6 F (37 C) (11/25 0740) Pulse Rate:  [76-80] 76  (11/25 1000) Resp:  [13-19] 13  (11/25 0900) BP: (99-133)/(56-75) 130/75 mmHg (11/25 1000) SpO2:  [95 %-100 %] 96 % (11/25 0740) Weight:  [54.5 kg (120 lb 2.4 oz)-54.9 kg (121 lb 0.5 oz)] 54.5 kg (120 lb 2.4 oz) (11/25 0740) Weight change: -0.3 kg (-10.6 oz)  Intake/Output from previous day: 11/24 0701 - 11/25 0700 In: 1260 [P.O.:1260] Out: -   Intake/Output from this shift:    Medications: Current Facility-Administered Medications  Medication Dose Route Frequency Provider Last Rate Last Dose  . 0.9 %  sodium chloride infusion   Intravenous Continuous PRN Jonah Blue, DO      . 0.9 %  sodium chloride infusion   Intravenous Continuous Lennette Bihari, MD 30 mL/hr at 05/06/12 1630    . 0.9 %  sodium chloride infusion  100 mL Intravenous PRN Maree Krabbe, MD      . 0.9 %  sodium chloride infusion  100 mL Intravenous PRN Maree Krabbe, MD      . acetaminophen (TYLENOL) suppository 650 mg  650 mg Rectal Q6H PRN Sheffield Slider, PA      . acetaminophen (TYLENOL) tablet 650 mg  650 mg Oral Q4H PRN Lennette Bihari, MD      . ALPRAZolam Prudy Feeler) tablet 0.25 mg  0.25 mg Oral BID PRN Abelino Derrick, PA      . alteplase (CATHFLO ACTIVASE) injection 2 mg  2 mg Intracatheter Once PRN Maree Krabbe, MD      . aspirin EC tablet 81 mg  81 mg Oral Daily Lennette Bihari, MD   81 mg at 05/08/12 0912  . atorvastatin (LIPITOR) tablet 40 mg  40 mg Oral q1800 Larey Seat, MD   40 mg at 05/08/12 1708  . calcium carbonate (dosed in mg elemental calcium) suspension 500 mg of elemental calcium  500 mg of elemental calcium Oral Q6H PRN Sheffield Slider, PA      . camphor-menthol [facility]) lotion 1  application  1 application Topical Q8H PRN Sheffield Slider, PA       And  . hydrOXYzine (ATARAX/VISTARIL) tablet 25 mg  25 mg Oral Q8H PRN Sheffield Slider, PA   25 mg at 05/08/12 2202  . carvedilol (COREG) tablet 9.375 mg  9.375 mg Oral BID WC Lennette Bihari, MD   9.375 mg at 05/08/12 1708  . ciprofloxacin (CIPRO) tablet 500 mg  500 mg Oral Daily Darden Palmer, MD   500 mg at 05/08/12 0912  . darbepoetin (ARANESP) injection 150 mcg  150 mcg Intravenous Q Sat-HD Sheffield Slider, PA   150 mcg at 05/07/12 1240  . docusate sodium (ENEMEEZ) enema 283 mg  1 enema Rectal PRN Sheffield Slider, PA      . feeding supplement (NEPRO CARB STEADY) liquid 237 mL  237 mL Oral TID PRN Sheffield Slider, PA      . feeding supplement (NEPRO CARB STEADY) liquid 237 mL  237 mL Oral BID BM Sheffield Slider, PA   237 mL at 05/08/12 1500  . feeding supplement (NEPRO CARB STEADY) liquid 237 mL  237 mL Oral PRN  Maree Krabbe, MD      . heparin injection 1,000 Units  1,000 Units Dialysis PRN Maree Krabbe, MD      . heparin injection 1,600 Units  1,600 Units Dialysis PRN Maree Krabbe, MD      . hydrALAZINE (APRESOLINE) tablet 25 mg  25 mg Oral Q8H Lennette Bihari, MD   25 mg at 05/09/12 0543  . HYDROmorphone (DILAUDID) 1 MG/ML injection           . HYDROmorphone (DILAUDID) injection 0.5 mg  0.5 mg Intravenous Q4H PRN Darden Palmer, MD   0.5 mg at 05/09/12 1032  . insulin aspart (novoLOG) injection 0-9 Units  0-9 Units Subcutaneous TID WC Darden Palmer, MD   1 Units at 05/08/12 1250  . isosorbide mononitrate (IMDUR) 24 hr tablet 30 mg  30 mg Oral Daily Lennette Bihari, MD   30 mg at 05/08/12 0912  . lidocaine (XYLOCAINE) 1 % injection 5 mL  5 mL Intradermal PRN Maree Krabbe, MD      . lidocaine-prilocaine (EMLA) cream 1 application  1 application Topical PRN Maree Krabbe, MD      . methadone (DOLOPHINE) tablet 5 mg  5 mg Oral TID Darden Palmer, MD   5 mg at 05/08/12 2202  . multivitamin  (RENA-VIT) tablet 1 tablet  1 tablet Oral Daily Darden Palmer, MD   1 tablet at 05/08/12 0912  . nitroGLYCERIN 0.2 mg/mL in dextrose 5 % infusion  2-200 mcg/min Intravenous Titrated Linward Headland, MD   5 mcg/min at 05/06/12 0148  . ondansetron (ZOFRAN) injection 4 mg  4 mg Intravenous Q6H PRN Lennette Bihari, MD   4 mg at 05/07/12 2036  . ondansetron (ZOFRAN) injection 4 mg  4 mg Intravenous Once Sheffield Slider, PA      . pantoprazole (PROTONIX) EC tablet 40 mg  40 mg Oral Daily Darden Palmer, MD   40 mg at 05/08/12 1708  . pentafluoroprop-tetrafluoroeth (GEBAUERS) aerosol 1 application  1 application Topical PRN Maree Krabbe, MD      . Gwyndolyn Kaufman [facility]) tablet 8.6 mg  1 tablet Oral BID Darden Palmer, MD   8.6 mg at 05/08/12 0912  . sevelamer (RENVELA) tablet 800 mg  800 mg Oral TID WC Sheffield Slider, PA   800 mg at 05/08/12 0815  . sodium chloride 0.9 % injection 3 mL  3 mL Intravenous Q12H Darden Palmer, MD   3 mL at 05/08/12 2202  . sorbitol 70 % solution 30 mL  30 mL Oral PRN Sheffield Slider, PA      . zolpidem (AMBIEN) tablet 5 mg  5 mg Oral QHS PRN Sheffield Slider, PA        Physical Exam:  (seen at dialysis) General appearance: alert and no distress Neck: JVD - 2 cm above sternal notch, no adenopathy, no carotid bruit, supple, symmetrical, trachea midline and thyroid not enlarged, symmetric, no tenderness/mass/nodules Lungs: diminished breath sounds bibasilar and rales bibasilar, AICD in left upper chest wall, dialysis cath in right upper chest wall Heart: regular rate and rhythm, S1, S2 normal, no murmur, click, rub or gallop Abdomen: soft, non-tender; bowel sounds normal; no masses,  no organomegaly Extremities: extremities normal, atraumatic, no cyanosis or edema Pulses: 2+ and symmetric Skin: Skin color, texture, turgor normal. No rashes or lesions Neurologic: Grossly normal  Lab Results: Results for orders placed during the hospital encounter of 05/05/12 (from the  past 48 hour(s))  CULTURE, BLOOD (ROUTINE X 2)     Status: Normal (Preliminary result)   Collection Time   05/07/12  1:15 PM      Component Value Range Comment   Specimen Description BLOOD HEMODIALYSIS CATHETER      Special Requests BOTTLES DRAWN AEROBIC AND ANAEROBIC 10CC      Culture  Setup Time 05/07/2012 17:57      Culture        Value:        BLOOD CULTURE RECEIVED NO GROWTH TO DATE CULTURE WILL BE HELD FOR 5 DAYS BEFORE ISSUING A FINAL NEGATIVE REPORT   Report Status PENDING     CULTURE, BLOOD (ROUTINE X 2)     Status: Normal (Preliminary result)   Collection Time   05/07/12  1:20 PM      Component Value Range Comment   Specimen Description BLOOD HEMODIALYSIS CATHETER      Special Requests BOTTLES DRAWN AEROBIC AND ANAEROBIC 10CC      Culture  Setup Time 05/07/2012 17:57      Culture        Value:        BLOOD CULTURE RECEIVED NO GROWTH TO DATE CULTURE WILL BE HELD FOR 5 DAYS BEFORE ISSUING A FINAL NEGATIVE REPORT   Report Status PENDING     HEPATITIS B SURFACE ANTIGEN     Status: Normal   Collection Time   05/07/12  2:00 PM      Component Value Range Comment   Hepatitis B Surface Ag NEGATIVE  NEGATIVE   GLUCOSE, CAPILLARY     Status: Abnormal   Collection Time   05/07/12  4:43 PM      Component Value Range Comment   Glucose-Capillary 192 (*) 70 - 99 mg/dL    Comment 1 Notify RN     GLUCOSE, CAPILLARY     Status: Abnormal   Collection Time   05/07/12  9:22 PM      Component Value Range Comment   Glucose-Capillary 105 (*) 70 - 99 mg/dL   CBC     Status: Abnormal   Collection Time   05/08/12  5:15 AM      Component Value Range Comment   WBC 11.7 (*) 4.0 - 10.5 K/uL    RBC 2.96 (*) 3.87 - 5.11 MIL/uL    Hemoglobin 8.1 (*) 12.0 - 15.0 g/dL    HCT 16.1 (*) 09.6 - 46.0 %    MCV 92.2  78.0 - 100.0 fL    MCH 27.4  26.0 - 34.0 pg    MCHC 29.7 (*) 30.0 - 36.0 g/dL    RDW 04.5 (*) 40.9 - 15.5 %    Platelets    150 - 400 K/uL    Value: PLATELET CLUMPS NOTED ON SMEAR, COUNT  APPEARS DECREASED  COMPREHENSIVE METABOLIC PANEL     Status: Abnormal   Collection Time   05/08/12  5:15 AM      Component Value Range Comment   Sodium 132 (*) 135 - 145 mEq/L    Potassium 4.1  3.5 - 5.1 mEq/L    Chloride 96  96 - 112 mEq/L    CO2 29  19 - 32 mEq/L    Glucose, Bld 107 (*) 70 - 99 mg/dL    BUN 15  6 - 23 mg/dL    Creatinine, Ser 8.11 (*) 0.50 - 1.10 mg/dL    Calcium 91.4 (*) 8.4 - 10.5 mg/dL    Total Protein 6.7  6.0 - 8.3  g/dL    Albumin 2.2 (*) 3.5 - 5.2 g/dL    AST 12  0 - 37 U/L    ALT 5  0 - 35 U/L    Alkaline Phosphatase 94  39 - 117 U/L    Total Bilirubin 0.3  0.3 - 1.2 mg/dL    GFR calc non Af Amer 18 (*) >90 mL/min    GFR calc Af Amer 21 (*) >90 mL/min   PRO B NATRIURETIC PEPTIDE     Status: Abnormal   Collection Time   05/08/12  5:15 AM      Component Value Range Comment   Pro B Natriuretic peptide (BNP) >70000.0 (*) 0.0 - 100.0 pg/mL   GLUCOSE, CAPILLARY     Status: Abnormal   Collection Time   05/08/12  7:38 AM      Component Value Range Comment   Glucose-Capillary 186 (*) 70 - 99 mg/dL    Comment 1 Notify RN     GLUCOSE, CAPILLARY     Status: Abnormal   Collection Time   05/08/12 12:21 PM      Component Value Range Comment   Glucose-Capillary 124 (*) 70 - 99 mg/dL    Comment 1 Notify RN     TROPONIN I     Status: Normal   Collection Time   05/08/12  1:40 PM      Component Value Range Comment   Troponin I <0.30  <0.30 ng/mL   GLUCOSE, CAPILLARY     Status: Abnormal   Collection Time   05/08/12  5:11 PM      Component Value Range Comment   Glucose-Capillary 133 (*) 70 - 99 mg/dL    Comment 1 Notify RN      Comment 2 Documented in Chart     TROPONIN I     Status: Normal   Collection Time   05/08/12  7:06 PM      Component Value Range Comment   Troponin I <0.30  <0.30 ng/mL   GLUCOSE, CAPILLARY     Status: Normal   Collection Time   05/08/12  8:44 PM      Component Value Range Comment   Glucose-Capillary 95  70 - 99 mg/dL   TROPONIN  I     Status: Normal   Collection Time   05/09/12 12:31 AM      Component Value Range Comment   Troponin I <0.30  <0.30 ng/mL   CBC     Status: Abnormal   Collection Time   05/09/12 12:37 AM      Component Value Range Comment   WBC 7.8  4.0 - 10.5 K/uL    RBC 2.75 (*) 3.87 - 5.11 MIL/uL    Hemoglobin 7.8 (*) 12.0 - 15.0 g/dL    HCT 45.4 (*) 09.8 - 46.0 %    MCV 92.7  78.0 - 100.0 fL    MCH 28.4  26.0 - 34.0 pg    MCHC 30.6  30.0 - 36.0 g/dL    RDW 11.9 (*) 14.7 - 15.5 %    Platelets 98 (*) 150 - 400 K/uL CONSISTENT WITH PREVIOUS RESULT  BASIC METABOLIC PANEL     Status: Abnormal   Collection Time   05/09/12 12:37 AM      Component Value Range Comment   Sodium 129 (*) 135 - 145 mEq/L    Potassium 4.7  3.5 - 5.1 mEq/L    Chloride 94 (*) 96 - 112 mEq/L    CO2  27  19 - 32 mEq/L    Glucose, Bld 207 (*) 70 - 99 mg/dL    BUN 26 (*) 6 - 23 mg/dL DELTA CHECK NOTED   Creatinine, Ser 3.39 (*) 0.50 - 1.10 mg/dL    Calcium 16.1 (*) 8.4 - 10.5 mg/dL    GFR calc non Af Amer 14 (*) >90 mL/min    GFR calc Af Amer 16 (*) >90 mL/min   PREPARE RBC (CROSSMATCH)     Status: Normal   Collection Time   05/09/12  8:40 AM      Component Value Range Comment   Order Confirmation ORDER PROCESSED BY BLOOD BANK     TYPE AND SCREEN     Status: Normal (Preliminary result)   Collection Time   05/09/12  8:40 AM      Component Value Range Comment   ABO/RH(D) O POS      Antibody Screen NEG      Sample Expiration 05/12/2012      Unit Number [id number]      Blood Component Type RED CELLS,LR      Unit division 00      Status of Unit ISSUED      Donor AG Type NEGATIVE FOR KELL ANTIGEN      Transfusion Status OK TO TRANSFUSE      Crossmatch Result COMPATIBLE      Unit Number [id number]      Blood Component Type RED CELLS,LR      Unit division 00      Status of Unit ALLOCATED      Donor AG Type NEGATIVE FOR KELL ANTIGEN      Transfusion Status OK TO TRANSFUSE      Crossmatch Result COMPATIBLE         Imaging: No results found.  Assessment:  1. Principal Problem: 2.  *NSTEMI (non-ST elevated myocardial infarction) 3. Active Problems: 4.  Ischemic cardiomyopathy, EF 20-25% echo April 2013, now 10% by cath (05/06/12) 5.  Chronic systolic congestive heart failure, NYHA class 3 6.  LBBB (left bundle branch block) 7.  Hypertension 8.  Pancreatic pseudocyst 9.  Pancreatitis, chronic 10.  Anemia in chronic kidney disease (CKD) 11.  Esophageal varices in cirrhosis, admitted June 2013 to [facility] 12.  ESRD on hemodialysis 13.  CAD, CABG X 4 Feb '08, LAD stent June '08 14.  NSVT 8-10bts 05/06/12 15.  ICD (implantable cardiac defibrillator) in place, MDT placed Jan 2013 [facility] 16.   Plan:  1. Getting UF today at dialysis. No chest pain. Main issue is heart failure management. EF appears incrementally decreased to 10% by LV gram at cath.  Will review echocardiogram today. Further medical management recommendations to follow.  Time Spent Directly with Patient:  15 minutes  Length of Stay:  LOS: 4 days   Chrystie Nose, MD, [facility] Attending Cardiologist [facility]  Beckem Tomberlin C 05/09/2012, 10:49 AM

## 2012-05-09 NOTE — Progress Notes (Signed)
INTERNAL MEDICINE TEACHING SERVICE Attending Note  Date: 05/09/2012  Patient name: [name]  Medical record number: [id number]  Date of birth: 30-May-1951    This patient has been seen and discussed with the house staff. Please see their note for complete details. I concur with their findings with the following additions/corrections: Feels well today. She was getting HD this morning on rounds. No CP. No SOB. She is getting PRBC with HD today. There is no evidence of bleeding on exam.  A/P: 61 yr. 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 SBP, presented with CP and found to have NSTEMI.  1) NSTEM/chronic systolic heart failure: s/p LHC. EF 10%. Await echo review by cards. Await their final recommendations on titration of meds. Needs PT/OT eval. Once Cards gives final recs and PT/OT gives final recs, if no worsening of clinical status, she can be discharged. 2) ESRD: Nephrology following.  3) Type 2 DM: Cont home meds.  4) recent UTI/SBP: On Cipro. She will need follow up, no abdominal complaints this admission.  This can probably be discontinued. 5) HTN: Coreg, hydralazine. Controlled.  6) Anemia chronic disease: Nephro to follow/PRBC's/EPO as needed to maintain goal Hgb.  7) rest per resident note.    Jonah Blue, DO  05/09/2012, 2:18 PM

## 2012-05-09 NOTE — Progress Notes (Signed)
Patient transferred via wheelchair  to 3W. Report given to Maralyn Sago, RN.  Pt is alert and oriented at this time with no signs of distress.

## 2012-05-09 NOTE — Procedures (Signed)
Seen on hemodialysis. Status post NSTEMI.  Hemodynamically stable. Hemoglobin down to 7.8 grams post MI & cath.  Will transfuse today given cardiovascular issues. 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  Will need venography at some point.  [name]

## 2012-05-09 NOTE — Evaluation (Signed)
Physical Therapy Evaluation Patient Details Name: [name] MRN: [id number] DOB: [birth year] Today's Date: 05/09/2012 Time: 4098-1191 PT Time Calculation (min): 48 min  PT Assessment / Plan / Recommendation H&P      Clinical Impression [name] is a 61 year old African American female with PMH of ESRD on HD T,Th,Sat, CHF--chronic systolic (Echo 09/2011 EF 20-25%), s/p CABG 2008 with cath 11/2006--3 vessel disease including stenosis of LAD-LIMA (stented), stenosis of SVG to diagonal (not stented), s/p pacemaker, HTN, DMII.  She was recently discharged from the hospital on 04/24/12 after being admitted on 04/21/09 for abdominal pain with unknown etiology and treated for possible UTI vs. SBP.  Admitted for NSTEMI.   Presents to PT today with generalized weakness limiting her independence with mobility and ADLs. Will benefit physical therapy in the acute setting to address the below impairments so as to maxmimize safety for d/c home. Did discuss the possibility for of ST-SNF for rehab prior to home since she lives alone but pt not willing to consider this. Rec HHPT, OT, home health safety eval and home health aide as well as toilet riser and RW.     PT Assessment  Patient needs continued PT services    Follow Up Recommendations  Home health PT; Home health safety eval; Home health aide; Supervision - Intermittent     Does the patient have the potential to tolerate intense rehabilitation      Barriers to Discharge Decreased caregiver support      Equipment Recommendations  Rolling walker with 5" wheels; Toilet rise with handles   Recommendations for Other Services OT consult   Frequency Min 3X/week    Precautions / Restrictions Restrictions Weight Bearing Restrictions: No   Pertinent Vitals/Pain C/o soreness of her coccyx. Educated pt on pressure relief and using pillows to alleviate her pain/pressure.       Mobility  Bed Mobility Bed Mobility: Supine to Sit Supine to Sit:  6: Modified independent (Device/Increase time) Transfers Transfers: Sit to Stand;Stand to Sit Sit to Stand: 5: Supervision;From bed;From chair/3-in-1;With armrests Stand to Sit: 5: Supervision;To chair/3-in-1;With armrests Details for Transfer Assistance: cues for safe hand placement Ambulation/Gait Ambulation/Gait Assistance: 5: Supervision Ambulation Distance (Feet): 300 Feet Assistive device: Rolling walker Ambulation/Gait Assistance Details: cues for tall posture and safe positioning with RW Gait Pattern: Trunk flexed;Shuffle Gait velocity: slowed Stairs: No Wheelchair Mobility Wheelchair Mobility: No              PT Diagnosis: Difficulty walking;Abnormality of gait;Generalized weakness  PT Problem List: Decreased strength;Decreased activity tolerance;Decreased balance;Decreased mobility;Decreased knowledge of use of DME;Cardiopulmonary status limiting activity PT Treatment Interventions: DME instruction;Gait training;Functional mobility training;Therapeutic activities;Therapeutic exercise;Balance training;Patient/family education   PT Goals Acute Rehab PT Goals PT Goal Formulation: With patient Time For Goal Achievement: 05/16/12 Potential to Achieve Goals: Good Pt will go Sit to Stand: with modified independence PT Goal: Sit to Stand - Progress: Goal set today Pt will go Stand to Sit: with modified independence PT Goal: Stand to Sit - Progress: Goal set today Pt will Transfer Bed to Chair/Chair to Bed: with modified independence PT Transfer Goal: Bed to Chair/Chair to Bed - Progress: Goal set today Pt will Ambulate: >150 feet;with modified independence;with least restrictive assistive device PT Goal: Ambulate - Progress: Goal set today Pt will Perform Home Exercise Program: Independently PT Goal: Perform Home Exercise Program - Progress: Goal set today  Visit Information  Last PT Received On: 05/09/12 Assistance Needed: +1    Subjective Data  Subjective: Im not  going to a nursing home. That is not for me.  Patient Stated Goal: home, and to get stronger so that she can do things for herself   Prior Functioning  Home Living Lives With: Alone Available Help at Discharge: Family;Friend(s);Available PRN/intermittently Type of Home: Apartment Home Access: Level entry Home Layout: One level Bathroom Shower/Tub: Engineer, manufacturing systems: Standard (too low) Home Adaptive Equipment: Walker - rolling;Hand-held shower hose;Bedside commode/3-in-1;Wheelchair - manual (old RW) Additional Comments: reports her toilet in her bathroom is too low and that she falls onto it, she does ahve a potty chair but she puts that in the guest bath and won't use it (it is also too big to put in her bathroom) Prior Function Level of Independence: Needs assistance Needs Assistance: Bathing;Dressing;Light Housekeeping;Meal Prep;Gait;Transfers Bath: Moderate Dressing: Moderate Meal Prep: Total Light Housekeeping: Total Gait Assistance: uses w/c a lot at home, was able to walk with RW Able to Take Stairs?: No Driving: No Vocation: On disability Communication Communication: No difficulties    Cognition  Overall Cognitive Status: Impaired Area of Impairment: Attention Arousal/Alertness: Awake/alert Orientation Level: Appears intact for tasks assessed Behavior During Session: [facility] for tasks performed Current Attention Level: Alternating    Extremity/Trunk Assessment Right Upper Extremity Assessment RUE ROM/Strength/Tone: WFL for tasks assessed Left Upper Extremity Assessment LUE ROM/Strength/Tone: WFL for tasks assessed Right Lower Extremity Assessment RLE ROM/Strength/Tone: Deficits RLE ROM/Strength/Tone Deficits: generalized weakness; grossly 3-4/5 Left Lower Extremity Assessment LLE ROM/Strength/Tone: Deficits LLE ROM/Strength/Tone Deficits: generalized weakness; grossly 3-4/5   Balance    End of Session PT - End of Session Equipment Utilized During  Treatment: Gait belt Activity Tolerance: Patient tolerated treatment well Patient left: in chair;with call bell/phone within reach Nurse Communication: Mobility status  GP     [facility] HELEN 05/09/2012, 4:37 PM

## 2012-05-09 NOTE — Progress Notes (Signed)
PT Cancellation Note  Patient Details Name: [name] MRN: [id number] DOB: 01/20/1951   Cancelled Treatment:    Reason Eval/Treat Not Completed: Patient at procedure or test/unavailable. Pt at HD.    [facility] HELEN 05/09/2012, 8:27 AM Pager: [phone]

## 2012-05-10 ENCOUNTER — Ambulatory Visit: Payer: Non-veteran care | Admitting: Internal Medicine

## 2012-05-10 DIAGNOSIS — M7989 Other specified soft tissue disorders: Secondary | ICD-10-CM

## 2012-05-10 LAB — CBC
MCH: 27.9 pg (ref 26.0–34.0)
Platelets: UNDETERMINED 10*3/uL (ref 150–400)
RBC: 3.76 MIL/uL — ABNORMAL LOW (ref 3.87–5.11)
RDW: 18.8 % — ABNORMAL HIGH (ref 11.5–15.5)

## 2012-05-10 LAB — TYPE AND SCREEN
Antibody Screen: NEGATIVE
Donor AG Type: NEGATIVE
Unit division: 0
Unit division: 0

## 2012-05-10 LAB — BASIC METABOLIC PANEL
CO2: 24 mEq/L (ref 19–32)
Calcium: 10.9 mg/dL — ABNORMAL HIGH (ref 8.4–10.5)
GFR calc Af Amer: 23 mL/min — ABNORMAL LOW (ref >90)
GFR calc non Af Amer: 20 mL/min — ABNORMAL LOW (ref >90)
Sodium: 131 mEq/L — ABNORMAL LOW (ref 135–145)

## 2012-05-10 LAB — GLUCOSE, CAPILLARY
Glucose-Capillary: 150 mg/dL — ABNORMAL HIGH (ref 70–99)
Glucose-Capillary: 253 mg/dL — ABNORMAL HIGH (ref 70–99)

## 2012-05-10 MED ORDER — CEFTAZIDIME 1 G IJ SOLR
1.0000 g | INTRAMUSCULAR | Status: DC
  Filled 2012-05-10: qty 1

## 2012-05-10 MED ORDER — VANCOMYCIN HCL IN DEXTROSE 1-5 GM/200ML-% IV SOLN
1000.0000 mg | Freq: Once | INTRAVENOUS | Status: AC
  Administered 2012-05-10: 1000 mg via INTRAVENOUS
  Filled 2012-05-10: qty 200

## 2012-05-10 MED ORDER — DEXTROSE 5 % IV SOLN
1.0000 g | Freq: Once | INTRAVENOUS | Status: AC
  Administered 2012-05-10: 1 g via INTRAVENOUS
  Filled 2012-05-10: qty 1

## 2012-05-10 MED ORDER — DEXTROSE 5 % IV SOLN
1.0000 g | INTRAVENOUS | Status: DC
  Filled 2012-05-10: qty 1

## 2012-05-10 MED ORDER — VANCOMYCIN HCL 500 MG IV SOLR: 500.0000 mg | INTRAVENOUS | Status: DC

## 2012-05-10 NOTE — Progress Notes (Signed)
Physical Therapy Treatment Patient Details Name: [name] MRN: [id number] DOB: [birth year] Today's Date: 05/10/2012 Time: 4010-2725 PT Time Calculation (min): 29 min  PT Assessment / Plan / Recommendation Comments on Treatment Session  Mobility improving, but gait is still too unsteady to amb. without assistive device while pt is alone.    Follow Up Recommendations  Home health PT;Supervision - Intermittent     Does the patient have the potential to tolerate intense rehabilitation     Barriers to Discharge        Equipment Recommendations  Rolling walker with 5" wheels;Toilet rise with handles    Recommendations for Other Services    Frequency Min 3X/week   Plan Discharge plan remains appropriate;Frequency remains appropriate    Precautions / Restrictions Restrictions Weight Bearing Restrictions: No   Pertinent Vitals/Pain     Mobility  Bed Mobility Bed Mobility: Supine to Sit Supine to Sit: 6: Modified independent (Device/Increase time) Details for Bed Mobility Assistance: safe technique Transfers Transfers: Sit to Stand;Stand to Sit Sit to Stand: 5: Supervision;From bed;From chair/3-in-1;With armrests Stand to Sit: 5: Supervision;To chair/3-in-1;With armrests Details for Transfer Assistance: reinforced safe hand placement Ambulation/Gait Ambulation/Gait Assistance: 5: Supervision Ambulation Distance (Feet): 460 Feet Assistive device: Rolling walker Ambulation/Gait Assistance Details: vc's for postural checks; used no RW today, but too guarded and mildly unsteady so would use the RW when alone. Gait Pattern: Step-through pattern;Decreased stride length;Trunk flexed Gait velocity: slow, but able to increase noticeably to VC Stairs: No Wheelchair Mobility Wheelchair Mobility: No    Exercises     PT Diagnosis:    PT Problem List:   PT Treatment Interventions:     PT Goals Acute Rehab PT Goals Time For Goal Achievement: 05/16/12 Potential to Achieve  Goals: Good PT Goal: Sit to Stand - Progress: Progressing toward goal PT Goal: Stand to Sit - Progress: Progressing toward goal PT Transfer Goal: Bed to Chair/Chair to Bed - Progress: Progressing toward goal PT Goal: Ambulate - Progress: Progressing toward goal  Visit Information  Last PT Received On: 05/10/12 Assistance Needed: +1    Subjective Data  Subjective: I'm going home, the nursing home is not for me.   Cognition  Overall Cognitive Status: Appears within functional limits for tasks assessed/performed Arousal/Alertness: Awake/alert Orientation Level: Appears intact for tasks assessed Behavior During Session: [facility] for tasks performed Current Attention Level: Divided    Balance  Balance Balance Assessed: Yes Static Sitting Balance Static Sitting - Balance Support: Feet supported;No upper extremity supported Static Sitting - Level of Assistance: 6: Modified independent (Device/Increase time) Dynamic Standing Balance Dynamic Standing - Balance Support: During functional activity;No upper extremity supported Dynamic Standing - Level of Assistance: Other (comment) (min guard)  End of Session PT - End of Session Equipment Utilized During Treatment: Gait belt Activity Tolerance: Patient tolerated treatment well Patient left: in chair;with call bell/phone within reach Nurse Communication: Mobility status   GP     Maudy Yonan, Eliseo Gum 05/10/2012, 3:57 PM 05/10/2012  [place] Bing, PT [phone] (pager)

## 2012-05-10 NOTE — Progress Notes (Signed)
Left:  No evidence of DVT or superficial thrombosis.  The graft appears to be patent.

## 2012-05-10 NOTE — Progress Notes (Signed)
[facility]  Patient Status: New  [facility] is providing the following services: RN, PT and HHA  If patient discharges after hours, please call [phone].   [name] 05/10/2012, 4:28 PM

## 2012-05-10 NOTE — Progress Notes (Signed)
INTERNAL MEDICINE TEACHING SERVICE Attending Note  Date: 05/10/2012  Patient name: [name]  Medical record number: [id number]  Date of birth: 05/23/1951    This patient has been seen and discussed with the house staff. Please see their note for complete details. I concur with their findings with the following additions/corrections: Fever overnight. States her left arm is more swollen and feels warm. On exam, her left arm has palpable thrill but is hot to the touch with some mild erythema. She has intact capillary refill and full ROM of her arm and hand. There may be some blockage of Left [place] vein due to ICD lead. U/S pending.   A/P: 61 yr. 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 SBP, presented with CP and found to have NSTEMI.  1) NSTEM/chronic systolic heart failure: s/p LHC. EF 10%. Management per cardiology.  She is compensated. Await final recs. 2) Left UE pain/edema: Concern for occlusion, either at graft site or more proximal. U/S pending read. Her fever may be related to this, she is on broad spectrum coverage. BC sent. It may be worthwhile to obtain [facility] from tunneled catheter as well. She has no leukocytosis, though. Looks well clinically. 3) ESRD: Nephrology following.  4) Type 2 DM: Cont home meds.  5) HTN: Coreg, hydralazine. Controlled.  6) Anemia chronic disease: Nephro to follow/PRBC's/EPO as needed to maintain goal Hgb.  7) rest per resident note.    Jonah Blue, DO  05/10/2012, 3:40 PM

## 2012-05-10 NOTE — Progress Notes (Signed)
[facility]  DAILY PROGRESS NOTE   Subjective:  Denies dyspnea. Febrile overnight and did not feel well. Otherwise better and more energetic following transfusion. No angina. Reports no hypotension during HD yesterday.  Objective:  Temp:  [98.1 F (36.7 C)-100.4 F (38 C)] 100.4 F (38 C) (11/26 0501) Pulse Rate:  [75-97] 82  (11/26 1050) Resp:  [15-22] 17  (11/26 0501) BP: (102-129)/(55-78) 114/67 mmHg (11/26 1050) SpO2:  [97 %-100 %] 98 % (11/26 0501) Weight:  [52.436 kg (115 lb 9.6 oz)-53 kg (116 lb 13.5 oz)] 52.436 kg (115 lb 9.6 oz) (11/26 0501) Weight change: -0.4 kg (-14.1 oz)  Intake/Output from previous day: 11/25 0701 - 11/26 0700 In: 993 [P.O.:640; I.V.:3; Blood:350] Out: 2050 [Urine:50]  Intake/Output from this shift: Total I/O In: 240 [P.O.:240] Out: -   Medications: Current Facility-Administered Medications  Medication Dose Route Frequency Provider Last Rate Last Dose  . acetaminophen (TYLENOL) suppository 650 mg  650 mg Rectal Q6H PRN Sheffield Slider, PA      . aspirin EC tablet 81 mg  81 mg Oral Daily Lennette Bihari, MD   81 mg at 05/10/12 1048  . atorvastatin (LIPITOR) tablet 40 mg  40 mg Oral q1800 Larey Seat, MD   40 mg at 05/09/12 1822  . calcium carbonate (dosed in mg elemental calcium) suspension 500 mg of elemental calcium  500 mg of elemental calcium Oral Q6H PRN Sheffield Slider, PA      . camphor[facility]) lotion 1 application  1 application Topical Q8H PRN Sheffield Slider, PA       And  . hydrOXYzine (ATARAX/VISTARIL) tablet 25 mg  25 mg Oral Q8H PRN Sheffield Slider, PA   25 mg at 05/08/12 2202  . carvedilol (COREG) tablet 9.375 mg  9.375 mg Oral BID WC Lennette Bihari, MD   9.375 mg at 05/10/12 1050  . cefTAZidime (FORTAZ) 1 g in dextrose 5 % 50 mL IVPB  1 g Intravenous Once Sheffield Slider, PA      . cefTAZidime (FORTAZ) 1 g in dextrose 5 % 50 mL IVPB  1 g Intravenous Q24H Sheffield Slider, PA        . darbepoetin Stan [place]) injection 150 mcg  150 mcg Intravenous Q Sat-HD Sheffield Slider, PA   150 mcg at 05/07/12 1240  . docusate sodium (ENEMEEZ) enema 283 mg  1 enema Rectal PRN Sheffield Slider, PA      . feeding supplement (NEPRO CARB STEADY) liquid 237 mL  237 mL Oral BID BM Sheffield Slider, PA   237 mL at 05/10/12 1055  . hydrALAZINE (APRESOLINE) tablet 25 mg  25 mg Oral Q8H Lennette Bihari, MD   25 mg at 05/10/12 0654  . [EXPIRED] HYDROmorphone (DILAUDID) 1 MG/ML injection           . HYDROmorphone (DILAUDID) injection 0.5 mg  0.5 mg Intravenous Q4H PRN Darden Palmer, MD   0.5 mg at 05/10/12 0610  . insulin aspart (novoLOG) injection 0-9 Units  0-9 Units Subcutaneous TID WC Darden Palmer, MD   2 Units at 05/10/12 0847  . isosorbide mononitrate (IMDUR) 24 hr tablet 30 mg  30 mg Oral Daily Lennette Bihari, MD   30 mg at 05/10/12 1048  . methadone (DOLOPHINE) tablet 5 mg  5 mg Oral TID Darden Palmer, MD   5 mg at 05/10/12 1047  . multivitamin (RENA-VIT) tablet 1 tablet  1  tablet Oral Daily Darden Palmer, MD   1 tablet at 05/10/12 1048  . ondansetron (ZOFRAN) injection 4 mg  4 mg Intravenous Once Sheffield Slider, PA      . pantoprazole (PROTONIX) EC tablet 40 mg  40 mg Oral Daily Darden Palmer, MD   40 mg at 05/10/12 1048  . senna (SENOKOT) tablet 8.6 mg  1 tablet Oral BID Darden Palmer, MD   8.6 mg at 05/10/12 1048  . sevelamer (RENVELA) tablet 800 mg  800 mg Oral TID WC Sheffield Slider, PA   800 mg at 05/10/12 1053  . sodium chloride 0.9 % injection 3 mL  3 mL Intravenous Q12H Darden Palmer, MD   3 mL at 05/10/12 1053  . sorbitol 70 % solution 30 mL  30 mL Oral PRN Sheffield Slider, PA      . vancomycin (VANCOCIN) 500 mg in sodium chloride 0.9 % 100 mL IVPB  500 mg Intravenous Q T,Th,Sa-HD Severiano Gilbert, PHARMD      . vancomycin (VANCOCIN) IVPB 1000 mg/200 mL premix  1,000 mg Intravenous Once Severiano Gilbert, PHARMD   1,000 mg at 05/10/12 1102  . zolpidem (AMBIEN)  tablet 5 mg  5 mg Oral QHS PRN Sheffield Slider, PA      . [DISCONTINUED] 0.9 %  sodium chloride infusion   Intravenous Continuous PRN Jonah Blue, DO      . [DISCONTINUED] 0.9 %  sodium chloride infusion   Intravenous Continuous Lennette Bihari, MD 30 mL/hr at 05/06/12 1630    . [DISCONTINUED] 0.9 %  sodium chloride infusion  100 mL Intravenous PRN Maree Krabbe, MD      . [DISCONTINUED] 0.9 %  sodium chloride infusion  100 mL Intravenous PRN Maree Krabbe, MD      . [DISCONTINUED] acetaminophen (TYLENOL) tablet 650 mg  650 mg Oral Q4H PRN Lennette Bihari, MD      . [DISCONTINUED] ALPRAZolam Prudy Feeler) tablet 0.25 mg  0.25 mg Oral BID PRN Abelino Derrick, PA      . [DISCONTINUED] alteplase (CATHFLO ACTIVASE) injection 2 mg  2 mg Intracatheter Once PRN Maree Krabbe, MD      . [DISCONTINUED] cefTAZidime (FORTAZ) injection 1 g  1 g Intramuscular Q24H Sheffield Slider, PA      . [DISCONTINUED] ciprofloxacin (CIPRO) tablet 500 mg  500 mg Oral Daily Darden Palmer, MD   500 mg at 05/09/12 1221  . [DISCONTINUED] feeding supplement (NEPRO CARB STEADY) liquid 237 mL  237 mL Oral TID PRN Sheffield Slider, PA      . [DISCONTINUED] feeding supplement (NEPRO CARB STEADY) liquid 237 mL  237 mL Oral PRN Maree Krabbe, MD      . [DISCONTINUED] heparin injection 1,000 Units  1,000 Units Dialysis PRN Maree Krabbe, MD      . [DISCONTINUED] heparin injection 1,600 Units  1,600 Units Dialysis PRN Maree Krabbe, MD      . [DISCONTINUED] lidocaine (XYLOCAINE) 1 % injection 5 mL  5 mL Intradermal PRN Maree Krabbe, MD      . [DISCONTINUED] lidocaine-prilocaine (EMLA) cream 1 application  1 application Topical PRN Maree Krabbe, MD      . [DISCONTINUED] nitroGLYCERIN 0.2 mg/mL in dextrose 5 % infusion  2-200 mcg/min Intravenous Titrated Linward Headland, MD   5 mcg/min at 05/06/12 0148  . [DISCONTINUED] ondansetron (ZOFRAN) injection 4 mg  4 mg Intravenous Q6H PRN Lennette Bihari, MD  4 mg at 05/07/12  2036  . [DISCONTINUED] pentafluoroprop-tetrafluoroeth (GEBAUERS) aerosol 1 application  1 application Topical PRN Maree Krabbe, MD        Physical Exam: General appearance: alert, cooperative and no distress Neck: JVD - 8 cm above sternal notch, no adenopathy, no carotid bruit, supple, symmetrical, trachea midline and thyroid not enlarged, symmetric, no tenderness/mass/nodules Lungs: clear to auscultation bilaterally Heart: regular rate and rhythm, S1, S2 normal and systolic murmur: holosystolic 2/6, blowing at apex Abdomen: normal findings: no bruits heard, no masses palpable and soft, non-tender and abnormal findings:  distended and hypoactive bowel sounds Extremities: edema 3+ left upper extremity. Excellent bruit overlies the L antecubital AVG Pulses: 2+ and symmetric   Skin: Skin color, texture, turgor normal. No rashes or lesions Neurologic: Grossly normal  Lab Results: Results for orders placed during the hospital encounter of 05/05/12 (from the past 48 hour(s))  GLUCOSE, CAPILLARY     Status: Abnormal   Collection Time   05/08/12 12:21 PM      Component Value Range Comment   Glucose-Capillary 124 (*) 70 - 99 mg/dL    Comment 1 Notify RN     TROPONIN I     Status: Normal   Collection Time   05/08/12  1:40 PM      Component Value Range Comment   Troponin I <0.30  <0.30 ng/mL   GLUCOSE, CAPILLARY     Status: Abnormal   Collection Time   05/08/12  5:11 PM      Component Value Range Comment   Glucose-Capillary 133 (*) 70 - 99 mg/dL    Comment 1 Notify RN      Comment 2 Documented in Chart     TROPONIN I     Status: Normal   Collection Time   05/08/12  7:06 PM      Component Value Range Comment   Troponin I <0.30  <0.30 ng/mL   GLUCOSE, CAPILLARY     Status: Normal   Collection Time   05/08/12  8:44 PM      Component Value Range Comment   Glucose-Capillary 95  70 - 99 mg/dL   TROPONIN I     Status: Normal   Collection Time   05/09/12 12:31 AM      Component  Value Range Comment   Troponin I <0.30  <0.30 ng/mL   CBC     Status: Abnormal   Collection Time   05/09/12 12:37 AM      Component Value Range Comment   WBC 7.8  4.0 - 10.5 K/uL    RBC 2.75 (*) 3.87 - 5.11 MIL/uL    Hemoglobin 7.8 (*) 12.0 - 15.0 g/dL    HCT 16.1 (*) 09.6 - 46.0 %    MCV 92.7  78.0 - 100.0 fL    MCH 28.4  26.0 - 34.0 pg    MCHC 30.6  30.0 - 36.0 g/dL    RDW 04.5 (*) 40.9 - 15.5 %    Platelets 98 (*) 150 - 400 K/uL CONSISTENT WITH PREVIOUS RESULT  BASIC METABOLIC PANEL     Status: Abnormal   Collection Time   05/09/12 12:37 AM      Component Value Range Comment   Sodium 129 (*) 135 - 145 mEq/L    Potassium 4.7  3.5 - 5.1 mEq/L    Chloride 94 (*) 96 - 112 mEq/L    CO2 27  19 - 32 mEq/L    Glucose, Bld 207 (*)  70 - 99 mg/dL    BUN 26 (*) 6 - 23 mg/dL DELTA CHECK NOTED   Creatinine, Ser 3.39 (*) 0.50 - 1.10 mg/dL    Calcium 16.1 (*) 8.4 - 10.5 mg/dL    GFR calc non Af Amer 14 (*) >90 mL/min    GFR calc Af Amer 16 (*) >90 mL/min   PREPARE RBC (CROSSMATCH)     Status: Normal   Collection Time   05/09/12  8:40 AM      Component Value Range Comment   Order Confirmation ORDER PROCESSED BY BLOOD BANK     TYPE AND SCREEN     Status: Normal   Collection Time   05/09/12  8:40 AM      Component Value Range Comment   ABO/RH(D) O POS      Antibody Screen NEG      Sample Expiration 05/12/2012      Unit Number [id number]      Blood Component Type RED CELLS,LR      Unit division 00      Status of Unit ISSUED,FINAL      Donor AG Type NEGATIVE FOR KELL ANTIGEN      Transfusion Status OK TO TRANSFUSE      Crossmatch Result COMPATIBLE      Unit Number [id number]      Blood Component Type RED CELLS,LR      Unit division 00      Status of Unit ISSUED,FINAL      Donor AG Type NEGATIVE FOR KELL ANTIGEN      Transfusion Status OK TO TRANSFUSE      Crossmatch Result COMPATIBLE     GLUCOSE, CAPILLARY     Status: Abnormal   Collection Time   05/09/12 12:34 PM       Component Value Range Comment   Glucose-Capillary 171 (*) 70 - 99 mg/dL   GLUCOSE, CAPILLARY     Status: Abnormal   Collection Time   05/09/12  5:04 PM      Component Value Range Comment   Glucose-Capillary 199 (*) 70 - 99 mg/dL   GLUCOSE, CAPILLARY     Status: Abnormal   Collection Time   05/09/12  9:48 PM      Component Value Range Comment   Glucose-Capillary 124 (*) 70 - 99 mg/dL   CBC     Status: Abnormal   Collection Time   05/10/12  5:45 AM      Component Value Range Comment   WBC 7.3  4.0 - 10.5 K/uL WHITE COUNT CONFIRMED ON SMEAR   RBC 3.76 (*) 3.87 - 5.11 MIL/uL    Hemoglobin 10.5 (*) 12.0 - 15.0 g/dL POST TRANSFUSION SPECIMEN   HCT 33.2 (*) 36.0 - 46.0 %    MCV 88.3  78.0 - 100.0 fL    MCH 27.9  26.0 - 34.0 pg    MCHC 31.6  30.0 - 36.0 g/dL    RDW 65.7 (*) 84.6 - 15.5 %    Platelets PLATELET CLUMPS NOTED ON SMEAR, UNABLE TO ESTIMATE  150 - 400 K/uL   BASIC METABOLIC PANEL     Status: Abnormal   Collection Time   05/10/12  5:45 AM      Component Value Range Comment   Sodium 131 (*) 135 - 145 mEq/L    Potassium 4.1  3.5 - 5.1 mEq/L    Chloride 96  96 - 112 mEq/L    CO2 24  19 - 32 mEq/L  Glucose, Bld 127 (*) 70 - 99 mg/dL    BUN 20  6 - 23 mg/dL    Creatinine, Ser 1.61 (*) 0.50 - 1.10 mg/dL    Calcium 09.6 (*) 8.4 - 10.5 mg/dL    GFR calc non Af Amer 20 (*) >90 mL/min    GFR calc Af Amer 23 (*) >90 mL/min   GLUCOSE, CAPILLARY     Status: Abnormal   Collection Time   05/10/12  7:17 AM      Component Value Range Comment   Glucose-Capillary 162 (*) 70 - 99 mg/dL     Imaging: No results found.  Assessment:  1. Principal Problem: 2.  *NSTEMI (non-ST elevated myocardial infarction) 3. Active Problems: 4.  Ischemic cardiomyopathy, EF 20-25% echo April 2013, now 10% by cath (05/06/12) 5.  Chronic systolic congestive heart failure, NYHA class 3 6.  LBBB (left bundle branch block) 7.  Hypertension 8.  Pancreatic pseudocyst 9.  Pancreatitis, chronic 10.   Anemia in chronic kidney disease (CKD) 11.  Esophageal varices in cirrhosis, admitted June 2013 to [facility] 12.  ESRD on hemodialysis 13.  CAD, CABG X 4 Feb '08, LAD stent June '08 14.  NSVT 8-10bts 05/06/12 15.  ICD (implantable cardiac defibrillator) in place, MDT placed Jan 2013 [facility] 16.   Plan:   1. Appears mildly hypervolemic by exam, but not in overt CHF. Multiple cardiac med changes and broad volume shifts with transfusion and HD, so no plans to change meds today. Higher hgb has been associated with marked symptomatic benefit. It is likely appropriate to keep her Hgb above 10. 2. Suspect partially occluded left subclavian vein due to ICD lead, but may have superimposed thrombosis. Check duplex US. She is not a good anticoagulation candidate due to cirrhosis and variceal bleeding.  Prognosis is very guarded due to multiple severe medical problems. Dialysis access will be a long term difficult issue.  Time Spent Directly with Patient:  30 minutes  Length of Stay:  LOS: 5 days    [name] 05/10/2012, 11:23 AM

## 2012-05-10 NOTE — Progress Notes (Signed)
[facility] Progress Note  Subjective:   Sweated all last night.  Had fever. Having abdominal pain. Waiting for methadone pill.  Called [phone] Vascular surgeon to reschedule her appt to evaluated swollen arm for next week.  Objective Filed Vitals:   05/09/12 1705 05/09/12 1707 05/09/12 2100 05/10/12 0501  BP:  102/55 103/59 129/78  Pulse: 86  75 97  Temp: 99.7 F (37.6 C)  98.1 F (36.7 C) 100.4 F (38 C)  TempSrc: Oral   Oral  Resp:   18 17  Height:      Weight:    52.436 kg (115 lb 9.6 oz)  SpO2: 98%  100% 98%   Physical Exam General: no sweating at present. Talkative, NAD Heart: RRR Lungs: decreased BS at bases Abdomen: soft Extremities: no sign. edema Dialysis Access: right I-J active; left AVGG marked edema + bruit  Dialysis Orders: Center: [facility] On TTS- transferred there 05/03/2012;  EDW 53.5 HD Bath 2K 2.25 Ca Optiflux 180 Time 4 Heparin 1600. Access left forearm AVGG placed 10/25 Dr. Hollace Hayward - not used yet; right I-J active BFR 400 DFR 800 Zemplar none IV/HD Epogen 15,000 Units IV/HD No IV Fe   Assessment/Plan:  1. NSTEMI with hx CABG 2008/severe cardiomyopathy w EF 10%, hx of multiple PCIs- s/p cath 11/23, plan is for medical Rx per cardiology - Dr. Rennis Golden to discuss with pt today. 2. ESRD TTS [facility] with post dialysis hypokalemia upon admission- received small dose KCl; she has been on a 2 K bath and obviously not eating enough to warrant this. Will use 4 K here and will need 3 or 4 K bath at discharge.; titrate EDW as tolerated due to edema. Access swelling likely due to central stenosis from left sided ICD-  appt to f/u with her vascular surgeon rescheduled for next week. HDWed on holiday schedule 3. Hypertension/volume - BP controlled; titrating EDW; CXR cites pul vasc congestion; large IDWG. Attempt to lower EDW while here. 2.9 kg off Sat with HD, 2 liters 11/25 with a post wt of 52.4 1 Kg below EDW, but she is a hospital gown. Na still low pre HD On coreg 9.375  bid Challenge volume as BP allows 4. Anemia - Transfused 2 units 11/25 due to Hgb 7.8 and cardiac history.  10.5 today Continue Aranesp at 150.  5. Metabolic bone disease - Hypercalcemic - use 2 Ca bath if K high enough to use 2 K bathiTPH drawn 11/21- will contact her HD center for results.; not on Zemplar and no iPTH levels available from her prior HD center; resume renvela 800 1 ac  6. Nutrition - add nepro supplements; needs to be started on oral nutritional supplmenet program (ONSP) at hr HD center after discharge. 7. Hyperlipidemia - on statin 8. DM - no meds needed at this time; 199 max yesterday. 9. Code status/DNR - Dr Eliott Nine signed DNR papers at her dialysis center last week after discussion with patient. She wishes to continue DNR Status in the hospital. 10. Fever - no increase in WBC today. Abdominal pain no more than usual. Follow. Given catheter and hospital status, would be prudent to check [facility] and give empiric Vanc and Elita Quick - I will order. Defer other work up to primary. Urine culture neg. BC drawn on admission pending.   Sheffield Slider, PA-C [facility] Beeper [phone] 05/10/2012,9:26 AM  LOS: 5 days    Additional Objective Labs: Basic Metabolic Panel:  Lab 05/10/12 2956 05/09/12 0037 05/08/12 0515 05/06/12  0439  NA 131* 129* 132* --  K 4.1 4.7 4.1 --  CL 96 94* 96 --  CO2 24 27 29  --  GLUCOSE 127* 207* 107* --  BUN 20 26* 15 --  CREATININE 2.49* 3.39* 2.67* --  CALCIUM 10.9* 11.3* 10.9* --  ALB -- -- -- --  PHOS -- -- -- 3.4   Liver Function Tests:  Lab 05/08/12 0515 05/06/12 0439  AST 12 17  ALT 5 5  ALKPHOS 94 72  BILITOT 0.3 0.3  PROT 6.7 6.2  ALBUMIN 2.2* 2.2*   CBC:  Lab 05/10/12 0545 05/09/12 0037 05/08/12 0515 05/07/12 0648 05/06/12 0838  WBC 7.3 7.8 11.7* -- --  NEUTROABS -- -- -- -- --  HGB 10.5* 7.8* 8.1* -- --  HCT 33.2* 25.5* 27.3* -- --  MCV 88.3 92.7 92.2 92.3 91.6  PLT PLATELET CLUMPS NOTED ON SMEAR, UNABLE TO  ESTIMATE 98* PLATELET CLUMPS NOTED ON SMEAR, COUNT APPEARS DECREASED -- --   Blood Culture    Component Value Date/Time   SDES URINE, CLEAN CATCH 05/08/2012 0036   SPECREQUEST NONE 05/08/2012 0036   CULT Multiple bacterial morphotypes present, none predominant. Suggest appropriate recollection if clinically indicated. 05/08/2012 0036   REPTSTATUS 05/09/2012 FINAL 05/08/2012 0036    Cardiac Enzymes:  Lab 05/09/12 0031 05/08/12 1906 05/08/12 1340 05/06/12 2350 05/06/12 1637  CKTOTAL -- -- -- -- --  CKMB -- -- -- -- --  CKMBINDEX -- -- -- -- --  TROPONINI <0.30 <0.30 <0.30 0.73* 0.60*   CBG:  Lab 05/10/12 0717 05/09/12 2148 05/09/12 1704 05/09/12 1234 05/08/12 2044  GLUCAP 162* 124* 199* 171* 95  edications:    . sodium chloride Stopped (05/06/12 1306)  . [DISCONTINUED] sodium chloride 30 mL/hr at 05/06/12 1630  . [DISCONTINUED] nitroGLYCERIN Stopped (05/06/12 1306)      . aspirin EC  81 mg Oral Daily  . atorvastatin  40 mg Oral q1800  . carvedilol  9.375 mg Oral BID WC  . darbepoetin (ARANESP) injection - DIALYSIS  150 mcg Intravenous Q Sat-HD  . feeding supplement (NEPRO CARB STEADY)  237 mL Oral BID BM  . hydrALAZINE  25 mg Oral Q8H  . [EXPIRED] HYDROmorphone      . insulin aspart  0-9 Units Subcutaneous TID WC  . isosorbide mononitrate  30 mg Oral Daily  . methadone  5 mg Oral TID  . multivitamin  1 tablet Oral Daily  . ondansetron  4 mg Intravenous Once  . pantoprazole  40 mg Oral Daily  . senna  1 tablet Oral BID  . sevelamer  800 mg Oral TID WC  . sodium chloride  3 mL Intravenous Q12H  . [DISCONTINUED] ciprofloxacin  500 mg Oral Daily   As above Agree with evaluation as noted. Lillyn Wieczorek C

## 2012-05-10 NOTE — Progress Notes (Signed)
Subjective:  Ms. Janney had a fever last night, complaining of worsening arm swelling and warmth. R groin site is painful, no pus or drainage. She also states that her pancreas is acting up.  No chest pain, SOB.  Objective: Vital signs in last 24 hours: Filed Vitals:   05/09/12 2100 05/10/12 0501 05/10/12 1050 05/10/12 1328  BP: 103/59 129/78 114/67 122/64  Pulse: 75 97 82   Temp: 98.1 F (36.7 C) 100.4 F (38 C)    TempSrc:  Oral    Resp: 18 17    Height:      Weight:  115 lb 9.6 oz (52.436 kg)    SpO2: 100% 98%     Weight change: -14.1 oz (-0.4 kg)  Intake/Output Summary (Last 24 hours) at 05/10/12 1409 Last data filed at 05/10/12 0807  Gross per 24 hour  Intake    560 ml  Output      0 ml  Net    560 ml    Physical Exam Blood pressure 122/64, pulse 82, temperature 100.4 F (38 C), temperature source Oral, resp. rate 17, height 5\' 4"  (1.626 m), weight 115 lb 9.6 oz (52.436 kg), SpO2 98.00%. General:  No acute distress, alert and oriented x 3, well-appearing  HEENT:  PERRL, EOMI, moist mucous membranes Cardiovascular:  Regular rate and rhythm, systolic murmur LSB Chest: Tunneled HD cath in place, non tender, pacer site is nontender, nonerythematous Respiratory:  clear, no rhonchi. Abdomen:  Soft, nondistended, diffusely tender to palpation, chronic, bowel sounds present LUE: warm to touch, 2+ pitting edema below the elbow, somewhat tight, tender, this is a change from yesterday. Other extremities WNL R groin site is tender to touch, small hematoma, no drainage or purulent drainage Skin: Warm, dry, no rashes Neuro: Not anxious appearing, no depressed mood, normal affect  Lab Results: Basic Metabolic Panel:  Lab 05/10/12 4098 05/09/12 0037 05/06/12 0439  NA 131* 129* --  K 4.1 4.7 --  CL 96 94* --  CO2 24 27 --  GLUCOSE 127* 207* --  BUN 20 26* --  CREATININE 2.49* 3.39* --  CALCIUM 10.9* 11.3* --  MG -- -- 2.3  PHOS -- -- 3.4   Liver Function Tests:  Lab  05/08/12 0515 05/06/12 0439  AST 12 17  ALT 5 5  ALKPHOS 94 72  BILITOT 0.3 0.3  PROT 6.7 6.2  ALBUMIN 2.2* 2.2*   CBC:  Lab 05/10/12 0545 05/09/12 0037  WBC 7.3 7.8  NEUTROABS -- --  HGB 10.5* 7.8*  HCT 33.2* 25.5*  MCV 88.3 92.7  PLT PLATELET CLUMPS NOTED ON SMEAR, UNABLE TO ESTIMATE 98*   Cardiac Enzymes:  Lab 05/09/12 0031 05/08/12 1906 05/08/12 1340  CKTOTAL -- -- --  CKMB -- -- --  CKMBINDEX -- -- --  TROPONINI <0.30 <0.30 <0.30   Fasting Lipid Panel:  Lab 05/06/12 0439  CHOL 91  HDL 46  LDLCALC 36  TRIG 44  CHOLHDL 2.0  LDLDIRECT --     Studies/Results: No results found. Medications: I have reviewed the patient's current medications. Scheduled Meds:    . aspirin EC  81 mg Oral Daily  . atorvastatin  40 mg Oral q1800  . carvedilol  9.375 mg Oral BID WC  . [COMPLETED] cefTAZidime (FORTAZ)  IV  1 g Intravenous Once  . cefTAZidime (FORTAZ)  IV  1 g Intravenous Q24H  . darbepoetin (ARANESP) injection - DIALYSIS  150 mcg Intravenous Q Sat-HD  . feeding supplement (NEPRO CARB STEADY)  237 mL Oral BID BM  . hydrALAZINE  25 mg Oral Q8H  . [EXPIRED] HYDROmorphone      . insulin aspart  0-9 Units Subcutaneous TID WC  . isosorbide mononitrate  30 mg Oral Daily  . methadone  5 mg Oral TID  . multivitamin  1 tablet Oral Daily  . ondansetron  4 mg Intravenous Once  . pantoprazole  40 mg Oral Daily  . senna  1 tablet Oral BID  . sevelamer  800 mg Oral TID WC  . sodium chloride  3 mL Intravenous Q12H  . vancomycin  500 mg Intravenous Q T,Th,Sa-HD  . [COMPLETED] vancomycin  1,000 mg Intravenous Once  . [DISCONTINUED] cefTAZidime  1 g Intramuscular Q24H  . [DISCONTINUED] ciprofloxacin  500 mg Oral Daily   Continuous Infusions:    . [DISCONTINUED] sodium chloride Stopped (05/06/12 1306)  . [DISCONTINUED] sodium chloride 30 mL/hr at 05/06/12 1630  . [DISCONTINUED] nitroGLYCERIN Stopped (05/06/12 1306)   PRN Meds:.acetaminophen, calcium carbonate (dosed in  mg elemental calcium), camphor-menthol, docusate sodium, HYDROmorphone (DILAUDID) injection, hydrOXYzine, sorbitol, zolpidem, [DISCONTINUED] sodium chloride, [DISCONTINUED] acetaminophen, [DISCONTINUED] ALPRAZolam, [DISCONTINUED] feeding supplement (NEPRO CARB STEADY), [DISCONTINUED] ondansetron (ZOFRAN) IV  Assessment/Plan:  Ms. Lightcap is a 60 year old female with PMH of ESRD on HD, chronic systolic CHF (Echo 09/2011 EF 20-25%), s/p CABG 2008, HTN, DMII admitted for chest pain.   Fever Intermittent fever and previously elevated WBC, now normal. HD as risk factor for bacteremia, however, patient clinically looks good. May also be spontaneous bacterial peritonitis vs UTI vs infected graft -culture catheter tip -repeat blood cultures -ceftaz and vanc started empirically -LUE ultrasound  Ischemic Cardiomyopathy- Chronic CHF NYHA class 3: Echo in 09/2011 EF 20-25%, s/p CABG 2008 with Cath 11/2006--3 vessel disease including stenosis of LAD-LIMA (stent in 2008), stenosis of SVG to diagonal (not stented), pacemaker in place. Followed at [facility] where pacemaker was placed. Was on home Ranexa 500mg  qd.  EF from cath 11/23 with EF 10%. ECHO confirmed EF 10%, severe global hypokinesis, aortic sclerosis, mod-sev MR, severe LA dilation  -cardiology following -cont coreg 6.25, cont ASA, imdur -hydralazine 25mg  BID -monitor BP  NSTEMI: resolved Patient has a hx of Chronic systolic CHF with Echo in 09/2011 EF 20-25%, s/p CABG 2008, pacemaker in place. Troponins: 0.08-->0.42-->1.3. EKG on admission with no obvious signs of ST changes on EKG and patient has ESRD. TIMI score 4 risk 20%. CXR on admission with enlargement of cardiac silhouette, pulmonary vascular congestion, probable perihilar edema, no gross pleural effusion or pneumothorax. Cardiology following. Cath 11/22: with lesions in LAD, OM, RCA, and 2/4 grafts. Est EF was 10% with hypokinesis and dilation of LV Troponins trending down  -maximal medical  therapy for CAD: cont coreg 6.25 BID, hydralazine  -ASA, statin  Leukocytosis: resolv 11/23: WBC increased to 14.5 today from 8. May be stress reaction from cardiac cath yesterday vs. SCP vs UTI 11/24: WBC trending down, no infectious symptoms, Tm 100.4 overnight -bl and urine cx  -follow CBC  ESRD on hemodialysis T, Th, Sat. Had HD on 05/05/12. Followed by [facility]. Cr 2.62 on admission. GFR: 21.  -will need Tuesday HD  LUE swelling s/p AVG placement LUE swelling likely due to stenosis vs thrombosis vs ICD venous blockage vs infection, will need imaging. Patient with worsening swelling and warmth 11/26 -f/u LUE [place] -reschedule vascular appt -on vanc and ceftaz  # Hx of Cirrhosis with Ascites  Pt states she is s/p esophageal variceal banding procedure,  though we do not have records of that. Last EGD with abnormal mass and gastritis, but no varices noted. She states she is followed at [facility] where she undergoes intermittent abdominal paracentesis usually every 2 weeks. Last drainage was one week ago. Recently treated with Flagyl/Cipro with concern for SBP. -completed cipro course for suspected SBP  Hypertension--BP on admission: 108/69. Noted to be hypotensive during HD. Is on Midodrine 5mg  BID and Coreg 3.125mg  BID at home. Midodrine discontinued. -hydralazine, coreg as above,   Anemia of chronic kidney disease (CKD)--ESRD on HD. Hb 8.6 on admission, baseline ~7.  -daily CBC  -continue to monitor, transfuse as needed with HD  Diabetes Mellitus II--well controlled, ESRD on HD. Last HbA1c: 5.7 09/2011. On no diabetic medication at home.   -CBG monitoring  -SSI sensitive  -continue to monitor   Chronic pancreatitis -cont home methadone  Nutrition: hypoalbuminemia with Albumin 2.2 -renal diet with supplementation  DVT Ppx: refusing SCDs -encourage use  Dispo:   -will need renal and cards f/u, reschedule vascular appt -anticipated dc in 2-3 days -The patient does  have a current PCP (ARMOUR, ROSS B, MD), therefore will not be requiring OPC follow-up after discharge.  -The patient does not have transportation limitations that hinder transportation to clinic appointments.    LOS: 5 days   Denton Ar 05/10/2012, 2:09 PM

## 2012-05-10 NOTE — Progress Notes (Signed)
ANTIBIOTIC CONSULT NOTE - INITIAL  Pharmacy Consult for vancomycin Indication: empiric  Allergies  Allergen Reactions  . Morphine And Related Anaphylaxis  . Ace Inhibitors Other (See Comments)    Unknown reaction but her physician stated that she can't take it  . Codeine Itching  . Lisinopril Other (See Comments)    Unknown reaction but her physician stated that she can't take it  . Tylenol (Acetaminophen) Other (See Comments)    Patient states that the doctor told her she has a Fatty Liver.    Patient Measurements: Height: 5\' 4"  (162.6 cm) Weight: 115 lb 9.6 oz (52.436 kg) IBW/kg (Calculated) : 54.7    Vital Signs: Temp: 100.4 F (38 C) (11/26 0501) Temp src: Oral (11/26 0501) BP: 129/78 mmHg (11/26 0501) Pulse Rate: 97  (11/26 0501) Intake/Output from previous day: 11/25 0701 - 11/26 0700 In: 993 [P.O.:640; I.V.:3; Blood:350] Out: 2050 [Urine:50] Intake/Output from this shift: Total I/O In: 240 [P.O.:240] Out: -   Labs:  Basename 05/10/12 0545 05/09/12 0037 05/08/12 0515  WBC 7.3 7.8 11.7*  HGB 10.5* 7.8* 8.1*  PLT PLATELET CLUMPS NOTED ON SMEAR, UNABLE TO ESTIMATE 98* PLATELET CLUMPS NOTED ON SMEAR, COUNT APPEARS DECREASED  LABCREA -- -- --  CREATININE 2.49* 3.39* 2.67*   Estimated Creatinine Clearance: 19.4 ml/min (by C-G formula based on Cr of 2.49). No results found for this basename: VANCOTROUGH:2,VANCOPEAK:2,VANCORANDOM:2,GENTTROUGH:2,GENTPEAK:2,GENTRANDOM:2,TOBRATROUGH:2,TOBRAPEAK:2,TOBRARND:2,AMIKACINPEAK:2,AMIKACINTROU:2,AMIKACIN:2, in the last 72 hours   Microbiology: Recent Results (from the past 720 hour(s))  CULTURE, BLOOD (ROUTINE X 2)     Status: Normal   Collection Time   04/21/12  8:00 PM      Component Value Range Status Comment   Specimen Description BLOOD HEMODIALYSIS CATHETER   Final    Special Requests BOTTLES DRAWN AEROBIC AND ANAEROBIC 10CC   Final    Culture  Setup Time 04/22/2012 04:30   Final    Culture NO GROWTH 5 DAYS   Final      Report Status 04/28/2012 FINAL   Final   CULTURE, BLOOD (ROUTINE X 2)     Status: Normal   Collection Time   04/21/12  8:02 PM      Component Value Range Status Comment   Specimen Description BLOOD HEMODIALYSIS CATHETER   Final    Special Requests BOTTLES DRAWN AEROBIC AND ANAEROBIC 10CC   Final    Culture  Setup Time 04/22/2012 04:29   Final    Culture NO GROWTH 5 DAYS   Final    Report Status 04/28/2012 FINAL   Final   URINE CULTURE     Status: Normal   Collection Time   04/22/12  2:38 PM      Component Value Range Status Comment   Specimen Description URINE, CLEAN CATCH   Final    Special Requests NONE   Final    Culture  Setup Time 04/22/2012 15:10   Final    Colony Count 25,000 COLONIES/ML   Final    Culture     Final    Value: Multiple bacterial morphotypes present, none predominant. Suggest appropriate recollection if clinically indicated.   Report Status 04/23/2012 FINAL   Final   CULTURE, BLOOD (ROUTINE X 2)     Status: Normal (Preliminary result)   Collection Time   05/07/12  1:15 PM      Component Value Range Status Comment   Specimen Description BLOOD HEMODIALYSIS CATHETER   Final    Special Requests BOTTLES DRAWN AEROBIC AND ANAEROBIC 10CC   Final  Culture  Setup Time 05/07/2012 17:57   Final    Culture     Final    Value:        BLOOD CULTURE RECEIVED NO GROWTH TO DATE CULTURE WILL BE HELD FOR 5 DAYS BEFORE ISSUING A FINAL NEGATIVE REPORT   Report Status PENDING   Incomplete   CULTURE, BLOOD (ROUTINE X 2)     Status: Normal (Preliminary result)   Collection Time   05/07/12  1:20 PM      Component Value Range Status Comment   Specimen Description BLOOD HEMODIALYSIS CATHETER   Final    Special Requests BOTTLES DRAWN AEROBIC AND ANAEROBIC 10CC   Final    Culture  Setup Time 05/07/2012 17:57   Final    Culture     Final    Value:        BLOOD CULTURE RECEIVED NO GROWTH TO DATE CULTURE WILL BE HELD FOR 5 DAYS BEFORE ISSUING A FINAL NEGATIVE REPORT   Report Status  PENDING   Incomplete   URINE CULTURE     Status: Normal   Collection Time   05/08/12 12:36 AM      Component Value Range Status Comment   Specimen Description URINE, CLEAN CATCH   Final    Special Requests NONE   Final    Culture  Setup Time 05/08/2012 13:44   Final    Colony Count >=100,000 COLONIES/ML   Final    Culture     Final    Value: Multiple bacterial morphotypes present, none predominant. Suggest appropriate recollection if clinically indicated.   Report Status 05/09/2012 FINAL   Final    Assessment: 61 year old female with sweating all night and fever this morning. Will empirically check blood culture and start antibiotics with vancomycin and fortaz.  Goal of Therapy:  Pre-hd vancomycin trough level 15-25  Plan:  Vancomycin 1g IV load after HD today 05/10/2012 then 500mg  qhd - TTS Fortaz 1g q24 ordered by renal Follow blood cultures and sensitivities   Severiano Gilbert 05/10/2012,10:02 AM

## 2012-05-11 DIAGNOSIS — Z95810 Presence of automatic (implantable) cardiac defibrillator: Secondary | ICD-10-CM

## 2012-05-11 DIAGNOSIS — R109 Unspecified abdominal pain: Secondary | ICD-10-CM

## 2012-05-11 DIAGNOSIS — G8929 Other chronic pain: Secondary | ICD-10-CM

## 2012-05-11 LAB — CBC
Hemoglobin: 10.3 g/dL — ABNORMAL LOW (ref 12.0–15.0)
RBC: 3.7 MIL/uL — ABNORMAL LOW (ref 3.87–5.11)
WBC: 8.1 10*3/uL (ref 4.0–10.5)

## 2012-05-11 LAB — BASIC METABOLIC PANEL
CO2: 27 mEq/L (ref 19–32)
Glucose, Bld: 157 mg/dL — ABNORMAL HIGH (ref 70–99)
Potassium: 4.1 mEq/L (ref 3.5–5.1)
Sodium: 130 mEq/L — ABNORMAL LOW (ref 135–145)

## 2012-05-11 LAB — GLUCOSE, CAPILLARY: Glucose-Capillary: 81 mg/dL (ref 70–99)

## 2012-05-11 MED ORDER — HEPARIN SODIUM (PORCINE) 1000 UNIT/ML DIALYSIS: 1000.0000 [IU] | INTRAMUSCULAR | Status: DC | PRN

## 2012-05-11 MED ORDER — HYDRALAZINE HCL 25 MG PO TABS: 25.0000 mg | ORAL_TABLET | Freq: Three times a day (TID) | ORAL | Status: DC

## 2012-05-11 MED ORDER — SODIUM CHLORIDE 0.9 % IV SOLN: 100.0000 mL | INTRAVENOUS | Status: DC | PRN

## 2012-05-11 MED ORDER — NEPRO/CARBSTEADY PO LIQD
237.0000 mL | Freq: Three times a day (TID) | ORAL | Status: DC
  Filled 2012-05-11 (×3): qty 237

## 2012-05-11 MED ORDER — ALTEPLASE 2 MG IJ SOLR: 2.0000 mg | Freq: Once | INTRAMUSCULAR | Status: DC | PRN

## 2012-05-11 MED ORDER — ONDANSETRON HCL 4 MG PO TABS
4.0000 mg | ORAL_TABLET | Freq: Three times a day (TID) | ORAL | Status: DC | PRN
  Filled 2012-05-11: qty 1

## 2012-05-11 MED ORDER — VANCOMYCIN HCL 500 MG IV SOLR: 500.0000 mg | INTRAVENOUS | Status: DC

## 2012-05-11 MED ORDER — ISOSORBIDE MONONITRATE ER 30 MG PO TB24: 30.0000 mg | ORAL_TABLET | Freq: Every day | ORAL | Status: DC

## 2012-05-11 MED ORDER — ASPIRIN 81 MG PO TBEC: 81.0000 mg | DELAYED_RELEASE_TABLET | Freq: Every day | ORAL | Status: DC

## 2012-05-11 MED ORDER — ONDANSETRON HCL 4 MG/2ML IJ SOLN
4.0000 mg | Freq: Four times a day (QID) | INTRAMUSCULAR | Status: DC | PRN
  Administered 2012-05-11: 4 mg via INTRAVENOUS
  Filled 2012-05-11: qty 2

## 2012-05-11 MED ORDER — CARVEDILOL 6.25 MG PO TABS: 6.2500 mg | ORAL_TABLET | Freq: Two times a day (BID) | ORAL | Status: DC

## 2012-05-11 MED ORDER — LIDOCAINE-PRILOCAINE 2.5-2.5 % EX CREA: 1.0000 "application " | TOPICAL_CREAM | CUTANEOUS | Status: DC | PRN

## 2012-05-11 MED ORDER — NEPRO/CARBSTEADY PO LIQD: 237.0000 mL | ORAL | Status: DC | PRN

## 2012-05-11 MED ORDER — LIDOCAINE HCL (PF) 1 % IJ SOLN
5.0000 mL | INTRAMUSCULAR | Status: DC | PRN
  Filled 2012-05-11: qty 5

## 2012-05-11 MED ORDER — HEPARIN SODIUM (PORCINE) 1000 UNIT/ML DIALYSIS: 20.0000 [IU]/kg | INTRAMUSCULAR | Status: DC | PRN

## 2012-05-11 MED ORDER — PENTAFLUOROPROP-TETRAFLUOROETH EX AERO: 1.0000 "application " | INHALATION_SPRAY | CUTANEOUS | Status: DC | PRN

## 2012-05-11 MED ORDER — VANCOMYCIN HCL 500 MG IV SOLR
500.0000 mg | Freq: Once | INTRAVENOUS | Status: DC
  Filled 2012-05-11: qty 500

## 2012-05-11 NOTE — Progress Notes (Addendum)
Subjective: No complaints except abd pain.  No chest pain, no SOB.  Objective: Vital signs in last 24 hours: Temp:  [98.2 F (36.8 C)-98.7 F (37.1 C)] 98.2 F (36.8 C) (11/27 1102) Pulse Rate:  [69-92] 92  (11/27 1159) Resp:  [15-23] 17  (11/27 1102) BP: (105-144)/(57-82) 124/60 mmHg (11/27 1159) SpO2:  [94 %-99 %] 95 % (11/27 1102) Weight:  [52.254 kg (115 lb 3.2 oz)-55.7 kg (122 lb 12.7 oz)] 54.1 kg (119 lb 4.3 oz) (11/27 1102) Weight change: 1.2 kg (2 lb 10.3 oz) Last BM Date: 05/10/12 Intake/Output from previous day:  +990 11/26 0701 - 11/27 0700 In: 990 [P.O.:990] Out: -  Intake/Output this shift: Total I/O In: -  Out: 1400 [Other:1400]  PE: General:alert and oriented, pleasant affect, just back from dialysis Heart:S1S2 RRR soft systolic murmur Lungs:clear without rales or rhonchi Abd:+ BS, very tender to palpation Ext:no edema of lower ext.  Lt upper ext swollen    Lab Results:  Basename 05/11/12 0605 05/10/12 0545  WBC 8.1 7.3  HGB 10.3* 10.5*  HCT 32.9* 33.2*  PLT 108* PLATELET CLUMPS NOTED ON SMEAR, UNABLE TO ESTIMATE   BMET  Basename 05/11/12 0605 05/10/12 0545  NA 130* 131*  K 4.1 4.1  CL 95* 96  CO2 27 24  GLUCOSE 157* 127*  BUN 35* 20  CREATININE 3.51* 2.49*  CALCIUM 11.8* 10.9*    Basename 05/09/12 0031 05/08/12 1906  TROPONINI <0.30 <0.30    Lab Results  Component Value Date   CHOL 91 05/06/2012   HDL 46 05/06/2012   LDLCALC 36 05/06/2012   TRIG 44 05/06/2012   CHOLHDL 2.0 05/06/2012   Lab Results  Component Value Date   HGBA1C 4.8 05/06/2012     Lab Results  Component Value Date   TSH 2.775 05/06/2012      Studies/Results: No results found.  Medications: I have reviewed the patient's current medications.    [name] aspirin EC  81 mg Oral Daily  . atorvastatin  40 mg Oral q1800  . carvedilol  9.375 mg Oral BID WC  . darbepoetin (ARANESP) injection - DIALYSIS  150 mcg Intravenous Q Sat-HD  . feeding supplement (NEPRO CARB  STEADY)  237 mL Oral BID BM  . hydrALAZINE  25 mg Oral Q8H  . insulin aspart  0-9 Units Subcutaneous TID WC  . isosorbide mononitrate  30 mg Oral Daily  . methadone  5 mg Oral TID  . multivitamin  1 tablet Oral Daily  . ondansetron  4 mg Intravenous Once  . pantoprazole  40 mg Oral Daily  . senna  1 tablet Oral BID  . sevelamer  800 mg Oral TID WC  . sodium chloride  3 mL Intravenous Q12H  . [DISCONTINUED] cefTAZidime (FORTAZ)  IV  1 g Intravenous Q24H  . [DISCONTINUED] vancomycin  500 mg Intravenous Q T,Th,Sa-HD  . [DISCONTINUED] vancomycin  500 mg Intravenous Once  . [DISCONTINUED] vancomycin  500 mg Intravenous Q T,Th,Sa-HD   Assessment/Plan: Principal Problem:  *NSTEMI (non-ST elevated myocardial infarction) Active Problems:  Ischemic cardiomyopathy, EF 20-25% echo April 2013, now 10% by cath (05/06/12)  Chronic systolic congestive heart failure, NYHA class 3  LBBB (left bundle branch block)  Hypertension  Pancreatic pseudocyst  Pancreatitis, chronic  Anemia in chronic kidney disease (CKD)  Esophageal varices in cirrhosis, admitted June 2013 to [facility]  ESRD on hemodialysis  CAD, CABG X 4 Feb '08, LAD stent June '08  NSVT 8-10bts 05/06/12  ICD (implantable cardiac  defibrillator) in place, MDT placed Jan 2013 [facility]  PLAN: Lt. upper ext doppler without DVT. + abd. Pain continues.  VS stable.   Dr. Royann Shivers felt Lt arm swelling due to "Suspect partially occluded left subclavian vein due to ICD lead".   LOS: 6 days   INGOLD,LAURA R 05/11/2012, 2:07 PM  I seen and evaluated the patient this PM along with the Nada Boozer, NP. I agree with her findings, examination as well as impression recommendations.  She seems very stable overall from a cardiac standpoint (no active angina) -- no more room to "up-titrate BB or afterload reducing agents as she is relatively hypotensive after HD.  No thrombus in LSCV.  -- Agree with Dr. Royann Shivers -- with HD & PPM, access will become an  issue soon.  Would keep cardiac meds "as is for now."  We will be available for ??'s as needed, but will sign off for now.  6.25mg  Coreg on Pre HD PM & hold dose HD AM  Hold AM & mid-day Hydralazine on HD day.  Marykay Lex, M.D., M.S. [facility] [address] [place], [place]  [address]  [phone] Pager # [phone] 05/11/2012 4:11 PM

## 2012-05-11 NOTE — Progress Notes (Signed)
Subjective:  [name] is doing well. She states that her arm feels much better, no increased swelling or pain, not as hot as yesterday. No fever overnight. Her abdominal pain is worsening, she states that her pancreas is "acting up again". States that her belly will feel better if she is at home.  No chest pain, SOB.  Objective: Vital signs in last 24 hours: Filed Vitals:   05/11/12 1100 05/11/12 1102 05/11/12 1159 05/11/12 1330  BP: 119/73 122/57 124/60 101/60  Pulse: 88 88 92 95  Temp:  98.2 F (36.8 C)  100.3 F (37.9 C)  TempSrc:  Oral    Resp: 16 17  17   Height:      Weight:  119 lb 4.3 oz (54.1 kg)    SpO2:  95%  96%   Weight change: 2 lb 10.3 oz (1.2 kg)  Intake/Output Summary (Last 24 hours) at 05/11/12 1428 Last data filed at 05/11/12 1300  Gross per 24 hour  Intake   1150 ml  Output   1400 ml  Net   -250 ml    Physical Exam Blood pressure 101/60, pulse 95, temperature 100.3 F (37.9 C), temperature source Oral, resp. rate 17, height 5\' 4"  (1.626 m), weight 119 lb 4.3 oz (54.1 kg), SpO2 96.00%. General:  No acute distress, alert and oriented x 3, well-appearing  HEENT:  PERRL, EOMI, moist mucous membranes Cardiovascular:  Regular rate and rhythm, systolic murmur LSB Chest: Tunneled HD cath in place, non tender, pacer site is nontender, nonerythematous Respiratory:  clear, no rhonchi. Abdomen:  Soft, nondistended, diffusely tender to palpation, chronic, bowel sounds present LUE: warm to touch, 2+ pitting edema throughout extremity, soft, nontender Other extremities WNL R groin site is less tender to touch, small hematoma, no drainage or purulent drainage Skin: Warm, dry, no rashes Neuro: Not anxious appearing, no depressed mood, normal affect  Lab Results: Basic Metabolic Panel:  Lab 05/11/12 1610 05/10/12 0545 05/06/12 0439  NA 130* 131* --  K 4.1 4.1 --  CL 95* 96 --  CO2 27 24 --  GLUCOSE 157* 127* --  BUN 35* 20 --  CREATININE 3.51* 2.49* --    CALCIUM 11.8* 10.9* --  MG -- -- 2.3  PHOS -- -- 3.4   Liver Function Tests:  Lab 05/08/12 0515 05/06/12 0439  AST 12 17  ALT 5 5  ALKPHOS 94 72  BILITOT 0.3 0.3  PROT 6.7 6.2  ALBUMIN 2.2* 2.2*   CBC:  Lab 05/11/12 0605 05/10/12 0545  WBC 8.1 7.3  NEUTROABS -- --  HGB 10.3* 10.5*  HCT 32.9* 33.2*  MCV 88.9 88.3  PLT 108* PLATELET CLUMPS NOTED ON SMEAR, UNABLE TO ESTIMATE   Cardiac Enzymes:  Lab 05/09/12 0031 05/08/12 1906 05/08/12 1340  CKTOTAL -- -- --  CKMB -- -- --  CKMBINDEX -- -- --  TROPONINI <0.30 <0.30 <0.30   Fasting Lipid Panel:  Lab 05/06/12 0439  CHOL 91  HDL 46  LDLCALC 36  TRIG 44  CHOLHDL 2.0  LDLDIRECT --     Studies/Results: No results found. Medications: I have reviewed the patient's current medications. Scheduled Meds:    . aspirin EC  81 mg Oral Daily  . atorvastatin  40 mg Oral q1800  . carvedilol  9.375 mg Oral BID WC  . darbepoetin (ARANESP) injection - DIALYSIS  150 mcg Intravenous Q Sat-HD  . feeding supplement (NEPRO CARB STEADY)  237 mL Oral BID BM  . hydrALAZINE  25 mg  Oral Q8H  . insulin aspart  0-9 Units Subcutaneous TID WC  . isosorbide mononitrate  30 mg Oral Daily  . methadone  5 mg Oral TID  . multivitamin  1 tablet Oral Daily  . ondansetron  4 mg Intravenous Once  . pantoprazole  40 mg Oral Daily  . senna  1 tablet Oral BID  . sevelamer  800 mg Oral TID WC  . sodium chloride  3 mL Intravenous Q12H  . [DISCONTINUED] cefTAZidime (FORTAZ)  IV  1 g Intravenous Q24H  . [DISCONTINUED] vancomycin  500 mg Intravenous Q T,Th,Sa-HD  . [DISCONTINUED] vancomycin  500 mg Intravenous Once  . [DISCONTINUED] vancomycin  500 mg Intravenous Q T,Th,Sa-HD   Continuous Infusions:   PRN Meds:.acetaminophen, calcium carbonate (dosed in mg elemental calcium), camphor-menthol, docusate sodium, HYDROmorphone (DILAUDID) injection, hydrOXYzine, ondansetron, sorbitol, zolpidem, [DISCONTINUED] ondansetron  Assessment/Plan:  Ms. Jinkins  is a 61 year old female with PMH of ESRD on HD, chronic systolic CHF (Echo 09/2011 EF 20-25%), s/p CABG 2008, HTN, DMII admitted for chest pain.   Fever Intermittent fever and previously elevated WBC, now normal. HD as risk factor for bacteremia, however, patient clinically looks good. May also be spontaneous bacterial peritonitis vs UTI vs infected graft vs post cath fever. Antitiotics started empirically yesterday but unclear of what they would be covering and no culture data for guidance. -cultures NGTD -after discussion with Dr. Lowell Guitar, nephrology, will d/c antibiotics as no indication at this time  Ischemic Cardiomyopathy- Chronic CHF NYHA class 3: Echo in 09/2011 EF 20-25%, s/p CABG 2008 with Cath 11/2006--3 vessel disease including stenosis of LAD-LIMA (stent in 2008), stenosis of SVG to diagonal (not stented), pacemaker in place. Follow[facility] at [facility] where pacemaker was placed. Was on home Ranexa 500mg  qd.   EF from cath 05/07/12 with EF 10%. ECHO confirmed EF 10%, severe global hypokinesis, aortic sclerosis, mod-sev MR, severe LA dilation  -cardiology follow up visit -cont coreg 6.25, cont ASA, imdur -hydralazine 25mg  BID -monitor BP  NSTEMI: resolved Patient has a hx of Chronic systolic CHF with Echo in 09/2011 EF 20-25%, s/p CABG 2008, pacemaker in place. Troponins: 0.08-->0.42-->1.3. EKG on admission with no obvious signs of ST changes on EKG and patient has ESRD. TIMI score 4 risk 20%. CXR on admission with enlargement of cardiac silhouette, pulmonary vascular congestion, probable perihilar edema, no gross pleural effusion or pneumothorax. Cardiology following. Cath 11/22: with lesions in LAD, OM, RCA, and 2/4 grafts. Est EF was 10% with hypokinesis and dilation of LV Troponins trending down  -maximal medical therapy for CAD: cont coreg 6.25 BID, hydralazine  -ASA, statin  ESRD on hemodialysis T, Th, Sat. Had HD on 05/05/12. Followed by [facility]. Cr 2.62 on admission. GFR:  21.  -will need HD outpatient f/u -renal aware of likely discharge today  LUE swelling s/p AVG placement LUE swelling likely due to stenosis vs thrombosis vs ICD venous blockage vs infection, will need imaging. Patient with worsening swelling and warmth 11/26. Does not look infected. Likely post surgical changes vs venous blockage. Will need imaging. No thrombosis noted on ultrasound yesterday.  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 Raina Mina in Interventional Radiology. Patient does have a history of portal vein htn with varices s/p banding x2 done Nov 04, 2011, as  well as frequent paracentesis for ascites thought to be due to either cirrhosis from NASH vs portal vein thrombosis causing PV hypertension. She was not a candidate for the TIPS procedure and has been getting intermittent paracentesis starting November 17 2011. She has had fluid drained on June 4, 10, 27, July 11, August 1, 15, 29, then nothing until April 28, 2012. They are considering her for a peritoneal venous shunt ([place] shunt) if she continues to require frequent paracenteses.   -f/u with [facility]  Hypertension--BP on admission: 108/69. Noted to be hypotensive during HD. Is on Midodrine 5mg  BID and Coreg 3.125mg  BID at home. Midodrine discontinued. -hydralazine, coreg as above,   Anemia of chronic kidney disease (CKD)--ESRD on HD. Hb 8.6 on admission, baseline ~7.  -daily CBC  -continue to monitor, transfuse as needed with HD  Diabetes Mellitus II--well controlled, ESRD on HD. Last HbA1c: 5.7 09/2011. On no diabetic medication at home.   -CBG monitoring  -SSI sensitive  -continue to monitor   Chronic pancreatitis -cont home methadone  Nutrition: hypoalbuminemia with Albumin 2.2 -renal diet with supplementation  DVT Ppx:  refusing SCDs -encourage use  Dispo:   -will need HD outpatient f/u -cards f/u for ischemic cardiomyopathy -will need to establish GI care for chronic pancreatitis, chronic abdominal pain, portal hypertension, and ascites -[facility] vascular appt next Monday for LUE AVG  -anticipated dc tonight -The patient does have a current PCP (ARMOUR, ROSS B, MD), therefore will not be requiring OPC follow-up after discharge.  -The patient does not have transportation limitations that hinder transportation to clinic appointments.    CHART REVIEW  Admitted 10/03/11 for abd pain and encephalopathy-Found to have perforated bowel, ruptured pancreatic pseudocyst and septic with KBC Carbapenamase prod Impienem-changed over to hemodialysis at admission had upper endoscopy as well  Cardiac cath 10/26/2001 equal 1 vessel obstructive coronary artery disease of the LAD 70  Admission 11/04/2001 with necrotizing pancreatitis and cholecystitis and respiratory failure-cholecystectomy was done  Admission 12/15/2001 for abdominal pain secondary to pancreatic pseudocyst older monitor was done and it showed pauses  Admission 02/07/2002 for abdominal pain once again  Admission 02/20/2002 for early satiety upper GI symptoms and then ultimately had definitive cholecystectomy and drainage of pancreatic pseudocyst  History tubal ligation 1970 vaginal hysterectomy 1975  Admission 07/30/2006 with severe three-vessel coronary disease status post coronary artery bypass grafting by Dr. Zenaida Niece tried-CABG x 4  Admission 11/04/2006 card mouth multifactorial acute artery postop and diastolic-known coronary artery disease history of CABG 2 LIMA to LAD, saphenous vein graft to diagonal, saphenous vein graft to PDA and saphenous vein graft circumflex  Admission 12/10/2006, had noted severe nonischemic cardiomyopathy LVEF of 80% and admitted with prolonged unstable angina, therefore PCI of proximal midanterior descending artery was done    Admission 27 2004 with volume overload and end-stage renal disease  Admission 01/12/2011 Isch CM 25%-at that time she was thought to probably benefit from pacemaker placement  Admission 06/02/2011 for acute volume overload with repeat echo EF 5-10% [facility] heart failure clinic  Admitted to [facility] 3/5-3/19 2013-then admitted 09/13/2011 with hematemesis bright red blood per rectum-it was felt that patient would benefit from cyst gastrostomy at a tertiary care center      LOS: 6 days   [place] Ar 05/11/2012, 2:28 PM

## 2012-05-11 NOTE — Progress Notes (Signed)
Assessment/Plan:  1. NSTEMI with hx CABG 2008/severe cardiomyopathy w EF 10%, hx of multiple PCIs- s/p cath 11/23, plan is for medical Rx per cardiology - Dr. Rennis Golden to discuss with pt today. 2. ESRD TTS [facility]  HD today on holiday schedule 3. Hypertension/volume -  4. Anemia - Transfused 2 units 11/25  5. LUE edema AV access and AICD; agree with stopping antibiotics 6. Abdominal Pain  Subjective: Interval History: C/O pancreatitis pain  Objective: Vital signs in last 24 hours: Temp:  [98.2 F (36.8 C)-98.7 F (37.1 C)] 98.2 F (36.8 C) (11/27 0650) Pulse Rate:  [69-85] 84  (11/27 0900) Resp:  [15-23] 23  (11/27 0900) BP: (111-144)/(59-79) 134/79 mmHg (11/27 0900) SpO2:  [94 %-99 %] 99 % (11/27 0659) Weight:  [52.254 kg (115 lb 3.2 oz)-55.7 kg (122 lb 12.7 oz)] 52.254 kg (115 lb 3.2 oz) (11/27 0650) Weight change: 1.2 kg (2 lb 10.3 oz)  Intake/Output from previous day: 11/26 0701 - 11/27 0700 In: 990 [P.O.:990] Out: -  Intake/Output this shift:    Lab Results:  Basename 05/11/12 0605 05/10/12 0545  WBC 8.1 7.3  HGB 10.3* 10.5*  HCT 32.9* 33.2*  PLT 108* PLATELET CLUMPS NOTED ON SMEAR, UNABLE TO ESTIMATE   BMET:  Basename 05/11/12 0605 05/10/12 0545  NA 130* 131*  K 4.1 4.1  CL 95* 96  CO2 27 24  GLUCOSE 157* 127*  BUN 35* 20  CREATININE 3.51* 2.49*  CALCIUM 11.8* 10.9*   No results found for this basename: PTH:2 in the last 72 hours Iron Studies: No results found for this basename: IRON,TIBC,TRANSFERRIN,FERRITIN in the last 72 hours Studies/Results: No results found.  Scheduled:   . aspirin EC  81 mg Oral Daily  . atorvastatin  40 mg Oral q1800  . carvedilol  9.375 mg Oral BID WC  . [COMPLETED] cefTAZidime (FORTAZ)  IV  1 g Intravenous Once  . cefTAZidime (FORTAZ)  IV  1 g Intravenous Q24H  . darbepoetin (ARANESP) injection - DIALYSIS  150 mcg Intravenous Q Sat-HD  . feeding supplement (NEPRO CARB STEADY)  237 mL Oral BID BM  . hydrALAZINE  25 mg Oral Q8H    . insulin aspart  0-9 Units Subcutaneous TID WC  . isosorbide mononitrate  30 mg Oral Daily  . methadone  5 mg Oral TID  . multivitamin  1 tablet Oral Daily  . ondansetron  4 mg Intravenous Once  . pantoprazole  40 mg Oral Daily  . senna  1 tablet Oral BID  . sevelamer  800 mg Oral TID WC  . sodium chloride  3 mL Intravenous Q12H  . vancomycin  500 mg Intravenous Q T,Th,Sa-HD  . [COMPLETED] vancomycin  1,000 mg Intravenous Once  . [DISCONTINUED] cefTAZidime  1 g Intramuscular Q24H     LOS: 6 days   Dniyah Grant C 05/11/2012,9:17 AM

## 2012-05-11 NOTE — Progress Notes (Signed)
CM did call the [facility] and CM placed order for DME RW and 3n1. [facility] and she will have to pick her equipment up then at that time per [place]- unable to deliver. CM will make pt aware. [phone]

## 2012-05-11 NOTE — Progress Notes (Signed)
Nutrition Follow-up  Intervention:   1. Increased Nepro shakes to TID. This will meet 1275 kcal, 57 gm protein daily.   2. RD will continue to follow    Assessment:   S/p cardiac cath, no interventions, planned to treat medically per Cards notes.  Nephrology notes indicate EDW to be 53.5 kg, remains above this at this time.   Developed fever overnight last night.   Current PO intake remains inadequate even with Nepro shakes BID. Will increased shakes to TID.   Diet Order:  Renal 60/70 PO intake: 0-20% recent meals Supplements: Nepro BID  Meds: Scheduled Meds:   . aspirin EC  81 mg Oral Daily  . atorvastatin  40 mg Oral q1800  . carvedilol  9.375 mg Oral BID WC  . [COMPLETED] cefTAZidime (FORTAZ)  IV  1 g Intravenous Once  . darbepoetin (ARANESP) injection - DIALYSIS  150 mcg Intravenous Q Sat-HD  . feeding supplement (NEPRO CARB STEADY)  237 mL Oral BID BM  . hydrALAZINE  25 mg Oral Q8H  . insulin aspart  0-9 Units Subcutaneous TID WC  . isosorbide mononitrate  30 mg Oral Daily  . methadone  5 mg Oral TID  . multivitamin  1 tablet Oral Daily  . ondansetron  4 mg Intravenous Once  . pantoprazole  40 mg Oral Daily  . senna  1 tablet Oral BID  . sevelamer  800 mg Oral TID WC  . sodium chloride  3 mL Intravenous Q12H  . [DISCONTINUED] cefTAZidime (FORTAZ)  IV  1 g Intravenous Q24H  . [DISCONTINUED] vancomycin  500 mg Intravenous Q T,Th,Sa-HD  . [DISCONTINUED] vancomycin  500 mg Intravenous Once  . [DISCONTINUED] vancomycin  500 mg Intravenous Q T,Th,Sa-HD   Continuous Infusions:  PRN Meds:.acetaminophen, calcium carbonate (dosed in mg elemental calcium), camphor-menthol, docusate sodium, HYDROmorphone (DILAUDID) injection, hydrOXYzine, ondansetron, sorbitol, zolpidem, [DISCONTINUED] ondansetron   CMP     Component Value Date/Time   NA 130* 05/11/2012 0605   K 4.1 05/11/2012 0605   CL 95* 05/11/2012 0605   CO2 27 05/11/2012 0605   GLUCOSE 157* 05/11/2012 0605   BUN 35*  05/11/2012 0605   CREATININE 3.51* 05/11/2012 0605   CALCIUM 11.8* 05/11/2012 0605   PROT 6.7 05/08/2012 0515   ALBUMIN 2.2* 05/08/2012 0515   AST 12 05/08/2012 0515   ALT 5 05/08/2012 0515   ALKPHOS 94 05/08/2012 0515   BILITOT 0.3 05/08/2012 0515   GFRNONAA 13* 05/11/2012 0605   GFRAA 15* 05/11/2012 0605   Sodium  Date/Time Value Range Status  05/11/2012  6:05 AM 130* 135 - 145 mEq/L Final  05/10/2012  5:45 AM 131* 135 - 145 mEq/L Final  05/09/2012 12:37 AM 129* 135 - 145 mEq/L Final    Potassium  Date/Time Value Range Status  05/11/2012  6:05 AM 4.1  3.5 - 5.1 mEq/L Final  05/10/2012  5:45 AM 4.1  3.5 - 5.1 mEq/L Final  05/09/2012 12:37 AM 4.7  3.5 - 5.1 mEq/L Final    Phosphorus  Date/Time Value Range Status  05/06/2012  4:39 AM 3.4  2.3 - 4.6 mg/dL Final  13/0/8657 84:69 AM 4.5  2.3 - 4.6 mg/dL Final  62/02/5283  1:32 AM 3.4  2.3 - 4.6 mg/dL Final    Magnesium  Date/Time Value Range Status  05/06/2012  4:39 AM 2.3  1.5 - 2.5 mg/dL Final  44/06/270  5:36 AM 1.8  1.5 - 2.5 mg/dL Final  11/16/4032  7:42 AM 1.7  1.5 - 2.5  mg/dL Final     CBG (last 3)   Basename 05/11/12 1142 05/10/12 2101 05/10/12 1626  GLUCAP 81 108* 253*     Intake/Output Summary (Last 24 hours) at 05/11/12 1248 Last data filed at 05/11/12 1102  Gross per 24 hour  Intake    750 ml  Output   1400 ml  Net   -650 ml    Weight Status:  119 lbs, variable with fluid status.  Admission weight: 119 lbs  Re-estimated needs:  1600-1800 kcal, 75-85 gm protein   Nutrition Dx:  Inadequate oral intake related to poor appetite as evidenced by hx of weight loss and malnutrition.   Goal:  PO intake to meet >/=90% estimated nutrition needs. --unmet   Monitor:  PO intake, weight, labs   Clarene Duke RD, LDN Pager [phone] After Hours pager [phone]

## 2012-05-11 NOTE — Discharge Summary (Signed)
Internal Medicine Teaching [facility] Discharge Note  Name: [name] MRN: [id number] DOB: Jun 07, 1951 61 y.o.  Date of Admission: 05/05/2012  8:49 PM Date of Discharge: 05/11/2012 Attending Physician: Jonah Blue, DO  Discharge Diagnosis: Principal Problem:  *NSTEMI (non-ST elevated myocardial infarction) Active Problems:  Ischemic cardiomyopathy, EF 20-25% echo April 2013, now 10% by cath (05/06/12)  Chronic systolic congestive heart failure, NYHA class 3  LBBB (left bundle branch block)  Hypertension  Pancreatic pseudocyst  Pancreatitis, chronic  Anemia in chronic kidney disease (CKD)  Esophageal varices in cirrhosis, admitted June 2013 to [facility]  ESRD on hemodialysis  CAD, CABG X 4 Feb '08, LAD stent June '08  NSVT 8-10bts 05/06/12  ICD (implantable cardiac defibrillator) in place, MDT placed Jan 2013 [facility] [place]   Discharge Medications:   Medication List     As of 05/16/2012 11:56 AM    STOP taking these medications         ciprofloxacin 500 MG tablet   Commonly known as: CIPRO      midodrine 5 MG tablet   Commonly known as: PROAMATINE      TAKE these medications         aspirin 81 MG EC tablet   Take 1 tablet (81 mg total) by mouth daily.      carvedilol 6.25 MG tablet   Commonly known as: COREG   Take 1 tablet (6.25 mg total) by mouth 2 (two) times daily with a meal.      darbepoetin 100 MCG/0.5ML Soln   Commonly known as: ARANESP   Inject 0.5 mLs (100 mcg total) into the vein every Monday with hemodialysis.      feeding supplement (NEPRO CARB STEADY) Liqd   Take 237 mLs by mouth 2 (two) times daily with breakfast and lunch.      hydrALAZINE 25 MG tablet   Commonly known as: APRESOLINE   Take 1 tablet (25 mg total) by mouth every 8 (eight) hours.      HYDROmorphone 2 MG tablet   Commonly known as: DILAUDID   Take 1 tablet (2 mg total) by mouth 3 (three) times daily. Takes along with methadone.      isosorbide mononitrate 30 MG 24 hr  tablet   Commonly known as: IMDUR   Take 1 tablet (30 mg total) by mouth daily.      Magnesium Oxide 420 MG Tabs   Take 1 tablet by mouth daily.      methadone 5 MG tablet   Commonly known as: DOLOPHINE   Take 1 tablet (5 mg total) by mouth 3 (three) times daily. Takes along with Dilaudid.      multivitamin Tabs tablet   Take 1 tablet by mouth daily.      pantoprazole 40 MG tablet   Commonly known as: PROTONIX   Take 40 mg by mouth daily.      pravastatin 20 MG tablet   Commonly known as: PRAVACHOL   Take 20 mg by mouth at bedtime.      ranolazine 500 MG 12 hr tablet   Commonly known as: RANEXA   Take 500 mg by mouth daily.      senna 8.6 MG Tabs   Commonly known as: SENOKOT   Take 1 tablet by mouth 2 (two) times daily.      sevelamer 400 MG tablet   Commonly known as: RENAGEL   Take 400 mg by mouth daily.         Disposition and  follow-up:   [name] was discharged from [facility] in stable condition.  At the hospital follow up visit please address the following issues:  -Cardiology follow up for ischemic cardiomyopathy and NSTEMI -patient will need to establish care with gastroenterology for chronic abdominal pain, chronic pancreatitis, hx of hemorrhagic pseudocyst, esophageal varices, ascites, portal hypertension, portal vein thrombosis -home health PT   Follow-up Appointments:     Follow-up Information    Follow up with Lennette Bihari, MD. (Dec 16 at 2:30 PM)    Contact information:   [address] [place] [address] [phone]       Follow up with Theda Belfast, MD. (December 9 at 10 AM)    Contact information:   [address] [place] [address] [phone]           Consultations: Treatment Team:  Marykay Lex, MD Maree Krabbe, MD  Procedures Performed:  Dg Chest Port 1 View  05/05/2012  *RADIOLOGY REPORT*  Clinical Data:  Chest pain  PORTABLE CHEST - 1 VIEW   Comparison: Portable exam 2120 hours compared to 04/21/2012  Findings: Right jugular dual-lumen central venous catheter tip projecting over right atrium. Left subclavian transvenous pacemaker / AICD leads project over right atrium, right ventricle, and coronary sinus. Enlargement of cardiac silhouette post CABG. Pulmonary vascular ingestion. Mild perihilar infiltrates suspect edema. No gross pleural effusion or pneumothorax.  IMPRESSION: Enlargement of cardiac silhouette with pulmonary vascular congestion post CABG and pacemaker / AICD. Probable perihilar edema.   Original Report Authenticated By: Ulyses Southward, M.D.    Dg Abd Acute W/chest  04/21/2012  *RADIOLOGY REPORT*  Clinical Data: Abdominal pain, nausea and vomiting  ACUTE ABDOMEN SERIES (ABDOMEN 2 VIEW & CHEST 1 VIEW)  Comparison: 04/01/2012; chest radiograph - 04/04/2012  Findings:  Grossly unchanged enlarged cardiac silhouette and mediastinal contours post median sternotomy and CABG.  Stable positioning of support apparatus.  No focal parenchymal opacities. Pulmonary venous congestion without frank evidence of edema.  No definite pleural effusion or pneumothorax.  Nonobstructive bowel gas pattern.  No pneumoperitoneum, pneumatosis or portal venous gas.  An enteric staple line overlies the left mid hemiabdomen.  Post cholecystectomy.  No acute osseous abnormalities.  IMPRESSION: 1.  Nonobstructive bowel gas pattern.  2.  Stable enlargement of the cardiac silhouette and mediastinal contours without definite evidence of pulmonary edema.   Original Report Authenticated By: Tacey Ruiz, MD     2D Echo: Please see full report from 05/09/11  Study Conclusions  - Left ventricle: The cavity size was moderately dilated. There was mild concentric hypertrophy. The estimated ejection fraction was 10%. Severe globalhypokinesis, incoordinate septal motion. The study is not technically sufficient to allow evaluation of LV diastolic function. - Aortic valve:  Sclerosis with mildstenosis. Peak and mean gradients are 27 and 13 mmHG. While the gradient may be underestimated due to low ejection fraction, there does not appear to be more than mild stenosis with a well-visualized valve. Trivial regurgitation. Valve area: 1.11cm^2(VTI). Valve area: 1.14cm^2 (Vmax). - Mitral valve: Calcified annulus. Moderate to severe posteriorly directedregurgitation, with a possible flail A2 scallop of the anterior leaflet, broad-based regurgitation is noted. - Left atrium: Severely dilated (99 ml/m2). - Right atrium: The atrium was mildly dilated. - Atrial septum: No defect or patent foramen ovale was identified. - Tricuspid valve: Moderate regurgitation. - Pulmonary arteries: PA peak pressure: 66mm Hg (S). - Inferior vena cava: The vessel was normal in size; the respirophasic  diameter changes were in the normal range (= 50%); findings are consistent with normal central venous pressure. - Pericardium, extracardiac: A trivial pericardial effusion was identified. Features were not consistent with tamponade physiology.    Cardiac Cath: Cath by Dr. Tresa Endo on 05/06/12, please see dictation for full information  Coronary Calcification  LM: nl  LAD: ostial prox 70%, patent proximal LAD stent  RI: 50 - 60% proximal and mid, 80% OM1  RCA: 80% proximal, 60 and 90% ostial RV branches  SVG to RCA: patent  SVG to LCX OM: patent  SVG to DX: appears moderately atretic extends to DX which is very small  LIMA to LAD: patent with 60% LAD disease post anastomosis  EF 10% with dilated severely hypocontractile LV.  Mynx closure 5 Fr RFA,.  Medical therapy.   Admission HPI:   Ms. Huss is a 60 year old African American female with PMH of ESRD on HD T,Th,Sat, CHF--chronic systolic (Echo 09/2011 EF 20-25%), s/p CABG 2008 with cath 11/2006--3 vessel disease including stenosis of LAD-LIMA (stented), stenosis of SVG to diagonal (not stented), s/p pacemaker, HTN, DMII who  presents to [facility] with complaints of chest pain this morning during HD session and at home around 8pm. She was recently discharged from the hospital on 04/24/12 after being admitted on 04/21/09 for abdominal pain with unknown etiology and treated for possible UTI vs. SBP and discharged on 7 day course of Ciprofloxacin and Flagyl.  Ms. Abele claims the chest pain started suddenly this afternoon around 330pm, felt like pressure, hard to quantify pain, started on right thumb, under right arm pit, and radiated up right arm and across chest but not to left side. The pain was associated with nausea and feeling hot and cold, and sweating. The pain lasted for approximately 1.5 hours and improved after receiving fluid bolus during HD. She then went home after HD and around 8pm at home, the pain returned, took two nitroglycerin tablets, no relief of pain, and she then called EMS. She was placed on O2 by EMS and pain improved by 845pm and was at the hospital by 9pm. Ms. Doxsee also endorses having a few more episodes of chest pain in ED since then but is currently free of chest pain at this time. She continues to have chronic abdominal pain secondary to her "pancreatitis" and had one episode of vomiting yesterday because she did not eat in time. She denies any current headaches, shortness of breath, fever, nausea, vomiting, diarrhea, constipation, or any urinary complaints at this time.  Of note, she follows with [facility] and recently has switched to [facility] for HD. She still makes urine, about 100cc five times a day. She also claims to be on chronic daily ciprofloxacin and has not completed her full course of antibiotics from her last admission yet. Her most recent paracentesis was at [facility] approximately 2 weeks prior and she normally goes every 3 weeks for her "fatty liver".   Hospital Course by problem list: Principal Problem:  *NSTEMI (non-ST elevated myocardial infarction) Active Problems:  Ischemic  cardiomyopathy, EF 20-25% echo April 2013, now 10% by cath (05/06/12)  Chronic systolic congestive heart failure, NYHA class 3  LBBB (left bundle branch block)  Hypertension  Pancreatic pseudocyst  Pancreatitis, chronic  Anemia in chronic kidney disease (CKD)  Esophageal varices in cirrhosis, admitted June 2013 to [facility]  ESRD on hemodialysis  CAD, CABG X 4 Feb '08, LAD stent June '08  NSVT 8-10bts 05/06/12  ICD (implantable  cardiac defibrillator) in place, MDT placed Jan 2013 [facility]    NSTEMI: Patient with extensive cardiac history presented with chest pain, found to have elevated troponins, which peaked at 1.39 and trended down. She has a hx of Chronic systolic CHF with Echo in 09/2011 EF 20-25%, s/p CABG 2008 with restenosis of graft 6 months after procedure, pacemaker in place. EKG on admission with no obvious signs of ST changes on EKG and patient has ESRD. TIMI score 4 risk 20%. CXR on admission with enlargement of cardiac silhouette, pulmonary vascular congestion, probable perihilar edema, no gross pleural effusion or pneumothorax. Cardiology was consulted and assisted in managing. She was started on a nitro and heparin drip. Cath 11/22 with lesions in LAD, OM, RCA, and 2/4 grafts. Est EF was 10% with hypokinesis and dilation of LV She was started on maximal medical therapy. Chest pain resolved and her troponins came down to undetectable levels.   Ischemic Cardiomyopathy- Chronic CHF NYHA class 3: Echo in 09/2011 EF 20-25%, s/p CABG 2008 with Cath 11/2006--3 vessel disease including stenosis of LAD-LIMA (stent in 2008), stenosis of SVG to diagonal (not stented), pacemaker in place. Followed at [facility] where pacemaker was placed. Was on home Ranexa 500mg  qd.  EF from cath 05/07/12 with EF 10%. ECHO during this admission confirmed EF 10%, severe global hypokinesis, aortic sclerosis, mod-sev MR, severe LA dilation. Patient was started on Coreg and continued on ASA, Imdur, hydralazine, statin. She  will f/u with cardiology.  LUE swelling s/p AVG placement  Patient has had LUE swelling 6 weeks after AVG placement at [facility]. Suspect this is likely due to stenosis vs thrombosis vs ICD venous blockage and will need further imaging. UE ultrasound on this admission without evidence of thrombus. She has an appointment with her vascular surgeon next Monday, which will be very important to attend for further work up of this swelling.   Fever and leukocytosis During this admission, patient had intermittent fever and leukocytosis. This Patient is on HD as risk factor for bacteremia, however, patient looked clinically stable.  Antitiotics were started empirically by nephrology but were subsequently discontinued after discussion with nephrology and no culture data to guide therapy. Blood cultures were negative except for one bottle which grew out coag negative staph. This was thought to be a contaminant as the other set drawn from that time was negative in addition to the previous blood cultures. Will defer on further treatment. Called patient on 12/1 and patient did not have any further fever or chills at home and was told to come to the ED or see her doctor if she had recurrent fever, chills, nausea, vomiting, fatigue. Patient can follow up with nephrology and PCP.  ESRD on hemodialysis T, Th, Sat. Had HD on 05/05/12. Followed by [facility]. Cr 2.62 on admission. GFR: 21. Patient received HD during this hospitalization and will continue outpatient HD.  # Hx of NASH with Ascites and Portal Vein Hypertension with hx of PV thrombosis  [facility] and spoke with Dr. Raina Mina in Interventional Radiology. Patient does have a history of portal vein htn with varices s/p banding x2 done Nov 04, 2011, as well as frequent paracentesis for ascites thought to be due to either cirrhosis from NASH vs portal vein thrombosis causing PV hypertension. She was not a candidate for the TIPS procedure and has been  getting intermittent paracentesis starting November 17 2011. She has had fluid drained on June 4, 10, 27, July 11, August 1,  15, 29, then nothing until April 28, 2012. They are considering her for a peritoneal venous shunt ([place] shunt) if she continues to require frequent paracenteses. Patient will f/u with [facility]   Hypertension--BP on admission: 108/69. Patient has been noted to be hypotensive during HD. Is on Midodrine 5mg  BID and Coreg 3.125mg  BID at home. Midodrine was discontinued during this hospitalization. She should continue hydralazine, coreg as above.  Anemia of chronic kidney disease (CKD)--ESRD on HD. Hb 8.6 on admission, baseline ~7. She did get one unit of blood before dialysis on 11/25 due to 7.8 Hgb.  Hgb was otherwise stable during this admission.   Diabetes Mellitus II--well controlled, ESRD on HD. Last HbA1c: 5.7 09/2011. On no diabetic medication at home. She was placed on sensitive SSI and CBG monitoring.     Discharge Vitals:  BP 92/48  Pulse 95  Temp 100.3 F (37.9 C) (Oral)  Resp 17  Ht 5\' 4"  (1.626 m)  Wt 119 lb 4.3 oz (54.1 kg)  BMI 20.47 kg/m2  SpO2 96%  Discharge Labs:  Results for orders placed during the hospital encounter of 05/05/12 (from the past 24 hour(s))  GLUCOSE, CAPILLARY     Status: Abnormal   Collection Time   05/10/12  4:26 PM      Component Value Range   Glucose-Capillary 253 (*) 70 - 99 mg/dL  GLUCOSE, CAPILLARY     Status: Abnormal   Collection Time   05/10/12  9:01 PM      Component Value Range   Glucose-Capillary 108 (*) 70 - 99 mg/dL  CBC     Status: Abnormal   Collection Time   05/11/12  6:05 AM      Component Value Range   WBC 8.1  4.0 - 10.5 K/uL   RBC 3.70 (*) 3.87 - 5.11 MIL/uL   Hemoglobin 10.3 (*) 12.0 - 15.0 g/dL   HCT 40.9 (*) 81.1 - 91.4 %   MCV 88.9  78.0 - 100.0 fL   MCH 27.8  26.0 - 34.0 pg   MCHC 31.3  30.0 - 36.0 g/dL   RDW 78.2 (*) 95.6 - 21.3 %   Platelets 108 (*) 150 - 400 K/uL  BASIC METABOLIC PANEL      Status: Abnormal   Collection Time   05/11/12  6:05 AM      Component Value Range   Sodium 130 (*) 135 - 145 mEq/L   Potassium 4.1  3.5 - 5.1 mEq/L   Chloride 95 (*) 96 - 112 mEq/L   CO2 27  19 - 32 mEq/L   Glucose, Bld 157 (*) 70 - 99 mg/dL   BUN 35 (*) 6 - 23 mg/dL   Creatinine, Ser 0.86 (*) 0.50 - 1.10 mg/dL   Calcium 57.8 (*) 8.4 - 10.5 mg/dL   GFR calc non Af Amer 13 (*) >90 mL/min   GFR calc Af Amer 15 (*) >90 mL/min  GLUCOSE, CAPILLARY     Status: Normal   Collection Time   05/11/12 11:42 AM      Component Value Range   Glucose-Capillary 81  70 - 99 mg/dL    Signed: Denton Ar 05/11/2012, 4:05 PM   Time Spent on Discharge: 30 Services Ordered on Discharge: HHPT Equipment Ordered on Discharge: 3 in 1, rolling walker

## 2012-05-11 NOTE — Progress Notes (Signed)
INTERNAL MEDICINE TEACHING SERVICE Attending Note  Date: 05/11/2012  Patient name: [name]  Medical record number: [id number]  Date of birth: 1951-04-12    This patient has been seen and discussed with the house staff. Please see their note for complete details. I concur with their findings with the following additions/corrections: Feels well this morning. States her LUE is less swollen and not as warm. She admits to mild abdominal pain which is chronic in nature and unchanged. She states she follows up at [facility] for this and has "chronic pancreatitis".  Discussed with Dr. Lowell Guitar current abx therapy and he agrees there is no need for this at this time.  A/P: 61 yr. 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 SBP, presented with CP and found to have NSTEMI.  1) NSTEM/chronic systolic heart failure: s/p LHC. EF 10%. Management per cardiology. She is compensated. She will need F/U and possible future evaluation of pacer as causative factor in LUE swelling. 2) Left UE pain/edema: This is improved. No fever. No leukocytosis. BC without growth to date.  I agree there is less edema and less warmth. Doppler U/S without evidence of DVT and graft is patent. She will need to follow up with her vascular surgeon to consider angio as part of evaluation due to proximal pacer wires. 3) ESRD: Nephrology following.  4) Type 2 DM: Cont home meds.  5) HTN: Coreg, hydralazine. Controlled.  6) Anemia chronic disease: Nephro to follow/PRBC's/EPO as needed to maintain goal Hgb.  7) Chronic abdominal pain: She carries a diagnosis of chronic pancreatitis and possible cirrhosis as well as esophageal varices. But, I do not see  evidence of varices on previous EGD. She also states she gets frequent paracenteses at [facility]. Discussed with resident need to discuss case with [facility] and arrange for follow up. She states her abdominal pain is controlled with medication and has not worsened in the past month. She is tolerating her diet. Denies N/V/D/C. I don't see a need for further evaluation in the hospital for this, but she will need to establish with GI/Liver at [facility] on discharge for further follow up.   Jonah Blue, DO  05/11/2012, 2:23 PM

## 2012-05-13 LAB — CULTURE, BLOOD (ROUTINE X 2)

## 2012-05-14 NOTE — Progress Notes (Signed)
Late entry:  Patient only received 1 dose of Aranesp on 05/07/12

## 2012-05-16 LAB — CULTURE, BLOOD (ROUTINE X 2): Culture: NO GROWTH

## 2012-05-18 NOTE — Discharge Summary (Signed)
INTERNAL MEDICINE TEACHING SERVICE Attending Note  Date: 05/18/2012  Patient name: [name]  Medical record number: [id number]  Date of birth: 1950/08/09    This patient has been seen and discussed with the house staff. Please see their note for complete details. I concur with their findings and plan. See my note on date of discharge.   Jonah Blue, DO  05/18/2012, 8:35 AM

## 2012-05-22 ENCOUNTER — Encounter (HOSPITAL_COMMUNITY): Payer: Self-pay

## 2012-05-22 ENCOUNTER — Inpatient Hospital Stay (HOSPITAL_COMMUNITY)
Admission: EM | Admit: 2012-05-22 | Discharge: 2012-05-27 | DRG: 438 | Disposition: A | Discharge status: Home Health | Payer: Medicare Other | Attending: Internal Medicine | Admitting: Internal Medicine

## 2012-05-22 ENCOUNTER — Emergency Department (HOSPITAL_COMMUNITY): Payer: Medicare Other

## 2012-05-22 DIAGNOSIS — R109 Unspecified abdominal pain: Secondary | ICD-10-CM | POA: Diagnosis present

## 2012-05-22 DIAGNOSIS — Z79899 Other long term (current) drug therapy: Secondary | ICD-10-CM

## 2012-05-22 DIAGNOSIS — I2510 Atherosclerotic heart disease of native coronary artery without angina pectoris: Secondary | ICD-10-CM | POA: Diagnosis present

## 2012-05-22 DIAGNOSIS — K7460 Unspecified cirrhosis of liver: Secondary | ICD-10-CM | POA: Diagnosis present

## 2012-05-22 DIAGNOSIS — K863 Pseudocyst of pancreas: Secondary | ICD-10-CM | POA: Diagnosis present

## 2012-05-22 DIAGNOSIS — N2581 Secondary hyperparathyroidism of renal origin: Secondary | ICD-10-CM | POA: Diagnosis present

## 2012-05-22 DIAGNOSIS — Z951 Presence of aortocoronary bypass graft: Secondary | ICD-10-CM

## 2012-05-22 DIAGNOSIS — Z833 Family history of diabetes mellitus: Secondary | ICD-10-CM

## 2012-05-22 DIAGNOSIS — Z888 Allergy status to other drugs, medicaments and biological substances status: Secondary | ICD-10-CM

## 2012-05-22 DIAGNOSIS — D72829 Elevated white blood cell count, unspecified: Secondary | ICD-10-CM | POA: Diagnosis present

## 2012-05-22 DIAGNOSIS — I509 Heart failure, unspecified: Secondary | ICD-10-CM | POA: Diagnosis present

## 2012-05-22 DIAGNOSIS — Z7982 Long term (current) use of aspirin: Secondary | ICD-10-CM

## 2012-05-22 DIAGNOSIS — Z992 Dependence on renal dialysis: Secondary | ICD-10-CM

## 2012-05-22 DIAGNOSIS — I214 Non-ST elevation (NSTEMI) myocardial infarction: Secondary | ICD-10-CM | POA: Diagnosis present

## 2012-05-22 DIAGNOSIS — Z9861 Coronary angioplasty status: Secondary | ICD-10-CM

## 2012-05-22 DIAGNOSIS — Z8249 Family history of ischemic heart disease and other diseases of the circulatory system: Secondary | ICD-10-CM

## 2012-05-22 DIAGNOSIS — Z886 Allergy status to analgesic agent status: Secondary | ICD-10-CM

## 2012-05-22 DIAGNOSIS — I120 Hypertensive chronic kidney disease with stage 5 chronic kidney disease or end stage renal disease: Secondary | ICD-10-CM | POA: Diagnosis present

## 2012-05-22 DIAGNOSIS — R079 Chest pain, unspecified: Secondary | ICD-10-CM | POA: Diagnosis present

## 2012-05-22 DIAGNOSIS — M7989 Other specified soft tissue disorders: Secondary | ICD-10-CM | POA: Diagnosis present

## 2012-05-22 DIAGNOSIS — N186 End stage renal disease: Secondary | ICD-10-CM | POA: Diagnosis present

## 2012-05-22 DIAGNOSIS — R509 Fever, unspecified: Secondary | ICD-10-CM | POA: Diagnosis not present

## 2012-05-22 DIAGNOSIS — Z87891 Personal history of nicotine dependence: Secondary | ICD-10-CM

## 2012-05-22 DIAGNOSIS — I255 Ischemic cardiomyopathy: Secondary | ICD-10-CM | POA: Diagnosis present

## 2012-05-22 DIAGNOSIS — I209 Angina pectoris, unspecified: Secondary | ICD-10-CM

## 2012-05-22 DIAGNOSIS — I10 Essential (primary) hypertension: Secondary | ICD-10-CM | POA: Diagnosis present

## 2012-05-22 DIAGNOSIS — Z95810 Presence of automatic (implantable) cardiac defibrillator: Secondary | ICD-10-CM

## 2012-05-22 DIAGNOSIS — I5022 Chronic systolic (congestive) heart failure: Secondary | ICD-10-CM | POA: Diagnosis present

## 2012-05-22 DIAGNOSIS — R1013 Epigastric pain: Secondary | ICD-10-CM | POA: Diagnosis present

## 2012-05-22 DIAGNOSIS — R630 Anorexia: Secondary | ICD-10-CM | POA: Diagnosis present

## 2012-05-22 DIAGNOSIS — E46 Unspecified protein-calorie malnutrition: Secondary | ICD-10-CM | POA: Diagnosis present

## 2012-05-22 DIAGNOSIS — I059 Rheumatic mitral valve disease, unspecified: Secondary | ICD-10-CM | POA: Diagnosis present

## 2012-05-22 DIAGNOSIS — E785 Hyperlipidemia, unspecified: Secondary | ICD-10-CM | POA: Diagnosis present

## 2012-05-22 DIAGNOSIS — D631 Anemia in chronic kidney disease: Secondary | ICD-10-CM | POA: Diagnosis present

## 2012-05-22 DIAGNOSIS — K862 Cyst of pancreas: Principal | ICD-10-CM | POA: Diagnosis present

## 2012-05-22 DIAGNOSIS — Z66 Do not resuscitate: Secondary | ICD-10-CM | POA: Diagnosis present

## 2012-05-22 DIAGNOSIS — R188 Other ascites: Secondary | ICD-10-CM | POA: Diagnosis present

## 2012-05-22 DIAGNOSIS — R5383 Other fatigue: Secondary | ICD-10-CM

## 2012-05-22 DIAGNOSIS — K859 Acute pancreatitis without necrosis or infection, unspecified: Secondary | ICD-10-CM | POA: Diagnosis present

## 2012-05-22 DIAGNOSIS — E1129 Type 2 diabetes mellitus with other diabetic kidney complication: Secondary | ICD-10-CM | POA: Diagnosis present

## 2012-05-22 DIAGNOSIS — I2589 Other forms of chronic ischemic heart disease: Secondary | ICD-10-CM | POA: Diagnosis present

## 2012-05-22 DIAGNOSIS — K861 Other chronic pancreatitis: Secondary | ICD-10-CM | POA: Diagnosis present

## 2012-05-22 DIAGNOSIS — R1032 Left lower quadrant pain: Secondary | ICD-10-CM | POA: Diagnosis present

## 2012-05-22 HISTORY — DX: Unspecified systolic (congestive) heart failure: I50.20

## 2012-05-22 HISTORY — DX: Non-ST elevation (NSTEMI) myocardial infarction: I21.4

## 2012-05-22 HISTORY — DX: Presence of automatic (implantable) cardiac defibrillator: Z95.810

## 2012-05-22 HISTORY — DX: Iron deficiency anemia, unspecified: D50.9

## 2012-05-22 HISTORY — DX: Esophageal varices without bleeding: I85.00

## 2012-05-22 HISTORY — DX: Presence of cardiac pacemaker: Z95.0

## 2012-05-22 HISTORY — DX: Personal history of other medical treatment: Z92.89

## 2012-05-22 HISTORY — DX: Unspecified chronic bronchitis: J42

## 2012-05-22 LAB — CBC WITH DIFFERENTIAL/PLATELET
Basophils Absolute: 0 10*3/uL (ref 0.0–0.1)
Basophils Relative: 0 % (ref 0–1)
Eosinophils Relative: 0 % (ref 0–5)
HCT: 31.3 % — ABNORMAL LOW (ref 36.0–46.0)
Hemoglobin: 9.7 g/dL — ABNORMAL LOW (ref 12.0–15.0)
MCH: 27.6 pg (ref 26.0–34.0)
MCHC: 31 g/dL (ref 30.0–36.0)
MCV: 88.9 fL (ref 78.0–100.0)
Monocytes Absolute: 1.3 10*3/uL — ABNORMAL HIGH (ref 0.1–1.0)
Monocytes Relative: 6 % (ref 3–12)
Neutro Abs: 18.3 10*3/uL — ABNORMAL HIGH (ref 1.7–7.7)
RDW: 17.3 % — ABNORMAL HIGH (ref 11.5–15.5)

## 2012-05-22 LAB — URINALYSIS, ROUTINE W REFLEX MICROSCOPIC
Bilirubin Urine: NEGATIVE
Hgb urine dipstick: NEGATIVE
Ketones, ur: NEGATIVE mg/dL
Protein, ur: 100 mg/dL — AB
Urobilinogen, UA: 0.2 mg/dL (ref 0.0–1.0)

## 2012-05-22 LAB — LIPASE, BLOOD: Lipase: 9 U/L — ABNORMAL LOW (ref 11–59)

## 2012-05-22 LAB — HEPATIC FUNCTION PANEL
ALT: 19 U/L (ref 0–35)
AST: 35 U/L (ref 0–37)
Alkaline Phosphatase: 144 U/L — ABNORMAL HIGH (ref 39–117)
Bilirubin, Direct: <0.1 mg/dL (ref 0.0–0.3)
Total Protein: 6.4 g/dL (ref 6.0–8.3)

## 2012-05-22 LAB — TROPONIN I
Troponin I: <0.3 ng/mL (ref <0.30)
Troponin I: <0.3 ng/mL (ref <0.30)

## 2012-05-22 LAB — URINE MICROSCOPIC-ADD ON

## 2012-05-22 LAB — BASIC METABOLIC PANEL
BUN: 28 mg/dL — ABNORMAL HIGH (ref 6–23)
Calcium: 10.5 mg/dL (ref 8.4–10.5)
Chloride: 96 mEq/L (ref 96–112)
Creatinine, Ser: 3.62 mg/dL — ABNORMAL HIGH (ref 0.50–1.10)
GFR calc Af Amer: 14 mL/min — ABNORMAL LOW (ref >90)

## 2012-05-22 MED ORDER — PROMETHAZINE HCL 25 MG/ML IJ SOLN
12.5000 mg | Freq: Once | INTRAMUSCULAR | Status: AC
  Administered 2012-05-22: 12.5 mg via INTRAVENOUS
  Filled 2012-05-22: qty 1

## 2012-05-22 MED ORDER — VANCOMYCIN HCL 10 G IV SOLR
1250.0000 mg | Freq: Once | INTRAVENOUS | Status: AC
  Administered 2012-05-23: 1250 mg via INTRAVENOUS
  Filled 2012-05-22: qty 1250

## 2012-05-22 MED ORDER — CARVEDILOL 6.25 MG PO TABS
6.2500 mg | ORAL_TABLET | Freq: Two times a day (BID) | ORAL | Status: DC
  Administered 2012-05-23 – 2012-05-27 (×8): 6.25 mg via ORAL
  Filled 2012-05-22 (×11): qty 1

## 2012-05-22 MED ORDER — NITROGLYCERIN 0.4 MG SL SUBL: 0.4000 mg | SUBLINGUAL_TABLET | SUBLINGUAL | Status: DC | PRN

## 2012-05-22 MED ORDER — SIMVASTATIN 10 MG PO TABS
10.0000 mg | ORAL_TABLET | Freq: Every day | ORAL | Status: DC
  Administered 2012-05-23 – 2012-05-27 (×5): 10 mg via ORAL
  Filled 2012-05-22 (×5): qty 1

## 2012-05-22 MED ORDER — PIPERACILLIN-TAZOBACTAM IN DEX 2-0.25 GM/50ML IV SOLN
2.2500 g | Freq: Three times a day (TID) | INTRAVENOUS | Status: DC
  Administered 2012-05-23 – 2012-05-27 (×14): 2.25 g via INTRAVENOUS
  Filled 2012-05-22 (×18): qty 50

## 2012-05-22 MED ORDER — HYDROMORPHONE HCL PF 1 MG/ML IJ SOLN
1.0000 mg | Freq: Once | INTRAMUSCULAR | Status: AC
  Administered 2012-05-22: 1 mg via INTRAVENOUS
  Filled 2012-05-22: qty 1

## 2012-05-22 MED ORDER — HYDRALAZINE HCL 25 MG PO TABS
25.0000 mg | ORAL_TABLET | Freq: Three times a day (TID) | ORAL | Status: DC
  Administered 2012-05-22 – 2012-05-27 (×12): 25 mg via ORAL
  Filled 2012-05-22 (×17): qty 1

## 2012-05-22 MED ORDER — ISOSORBIDE MONONITRATE ER 30 MG PO TB24
30.0000 mg | ORAL_TABLET | Freq: Every day | ORAL | Status: DC
  Administered 2012-05-23 – 2012-05-27 (×5): 30 mg via ORAL
  Filled 2012-05-22 (×5): qty 1

## 2012-05-22 MED ORDER — SEVELAMER CARBONATE 800 MG PO TABS
400.0000 mg | ORAL_TABLET | Freq: Every day | ORAL | Status: DC
  Filled 2012-05-22 (×2): qty 0.5

## 2012-05-22 MED ORDER — ONDANSETRON HCL 4 MG/2ML IJ SOLN
4.0000 mg | Freq: Once | INTRAMUSCULAR | Status: AC
Start: 2012-05-22 — End: 2012-05-22
  Administered 2012-05-22: 4 mg via INTRAVENOUS
  Filled 2012-05-22: qty 2

## 2012-05-22 MED ORDER — HYDROMORPHONE HCL PF 1 MG/ML IJ SOLN
0.5000 mg | Freq: Once | INTRAMUSCULAR | Status: AC
Start: 2012-05-22 — End: 2012-05-22
  Administered 2012-05-22: 0.5 mg via INTRAVENOUS
  Filled 2012-05-22: qty 1

## 2012-05-22 MED ORDER — RANOLAZINE ER 500 MG PO TB12
500.0000 mg | ORAL_TABLET | Freq: Every day | ORAL | Status: DC
  Administered 2012-05-22 – 2012-05-27 (×6): 500 mg via ORAL
  Filled 2012-05-22 (×6): qty 1

## 2012-05-22 MED ORDER — SODIUM CHLORIDE 0.9 % IJ SOLN
3.0000 mL | Freq: Two times a day (BID) | INTRAMUSCULAR | Status: DC
  Administered 2012-05-22 – 2012-05-27 (×10): 3 mL via INTRAVENOUS

## 2012-05-22 MED ORDER — ENOXAPARIN SODIUM 30 MG/0.3ML ~~LOC~~ SOLN
30.0000 mg | SUBCUTANEOUS | Status: DC
  Administered 2012-05-22 – 2012-05-26 (×5): 30 mg via SUBCUTANEOUS
  Filled 2012-05-22 (×6): qty 0.3

## 2012-05-22 MED ORDER — PANTOPRAZOLE SODIUM 40 MG PO TBEC
40.0000 mg | DELAYED_RELEASE_TABLET | Freq: Every day | ORAL | Status: DC
  Administered 2012-05-22 – 2012-05-27 (×6): 40 mg via ORAL
  Filled 2012-05-22 (×6): qty 1

## 2012-05-22 MED ORDER — IOHEXOL 300 MG/ML  SOLN: 20.0000 mL | Freq: Once | INTRAMUSCULAR | Status: DC | PRN

## 2012-05-22 MED ORDER — NITROGLYCERIN 0.4 MG SL SUBL
0.4000 mg | SUBLINGUAL_TABLET | SUBLINGUAL | Status: DC | PRN
  Administered 2012-05-22: 0.4 mg via SUBLINGUAL
  Filled 2012-05-22: qty 25

## 2012-05-22 MED ORDER — HYDROMORPHONE HCL 2 MG PO TABS
2.0000 mg | ORAL_TABLET | Freq: Three times a day (TID) | ORAL | Status: DC
  Administered 2012-05-22 – 2012-05-26 (×11): 2 mg via ORAL
  Filled 2012-05-22 (×10): qty 1

## 2012-05-22 MED ORDER — VANCOMYCIN HCL 1000 MG IV SOLR: 750.0000 mg | INTRAVENOUS | Status: DC

## 2012-05-22 MED ORDER — METHADONE HCL 5 MG PO TABS
5.0000 mg | ORAL_TABLET | Freq: Three times a day (TID) | ORAL | Status: DC
  Administered 2012-05-22 – 2012-05-27 (×12): 5 mg via ORAL
  Filled 2012-05-22 (×14): qty 1

## 2012-05-22 MED ORDER — ASPIRIN EC 81 MG PO TBEC
81.0000 mg | DELAYED_RELEASE_TABLET | Freq: Every day | ORAL | Status: DC
  Administered 2012-05-22 – 2012-05-27 (×6): 81 mg via ORAL
  Filled 2012-05-22 (×6): qty 1

## 2012-05-22 NOTE — ED Provider Notes (Signed)
Medical screening examination/treatment/procedure(s) were conducted as a shared visit with non-physician practitioner(s) and myself.  I personally evaluated the patient during the encounter  Hx ESRD, ischemic cardiomyopathy with recent cath 11/13 presenting with R arm pain radiating to thumb similar to previous MI. Also chronic abdominal pain, LUE edema from failed graft placement.new leukocytosis noted. With ongoing abdominal pain, recommend CT to evaluate for necrotizing pancreatitis but patient refuses.  Glynn Octave, MD 05/22/12 [phone]

## 2012-05-22 NOTE — ED Notes (Signed)
Attempted to deliver PO contrast, Patient refusing CT exam.

## 2012-05-22 NOTE — Progress Notes (Signed)
ANTIBIOTIC CONSULT NOTE - INITIAL  Pharmacy Consult for vancomycin/zosyn Indication: rule out sepsis  Allergies  Allergen Reactions  . Morphine And Related     SOB when given IV once  . Ace Inhibitors Other (See Comments)    Unknown reaction but her physician stated that she can't take it (specifically lisinopril)  . Codeine Itching  . Tylenol (Acetaminophen) Other (See Comments)    Patient states that the doctor told her she has a Fatty Liver.    Patient Measurements: Height: 5\' 4"  (162.6 cm) Weight: 123 lb 12.8 oz (56.155 kg) (scale A) IBW/kg (Calculated) : 54.7    Vital Signs: Temp: 100.5 F (38.1 C) (12/08 2035) Temp src: Oral (12/08 2035) BP: 122/73 mmHg (12/08 2035) Pulse Rate: 95  (12/08 2035) Intake/Output from previous day:   Intake/Output from this shift: Total I/O In: 220 [P.O.:220] Out: -   Labs:  [facility] 05/22/12 0709  WBC 20.8*  HGB 9.7*  PLT 169  LABCREA --  CREATININE 3.62*   Estimated Creatinine Clearance: 13.9 ml/min (by C-G formula based on Cr of 3.62). No results found for this basename: VANCOTROUGH:2,VANCOPEAK:2,VANCORANDOM:2,GENTTROUGH:2,GENTPEAK:2,GENTRANDOM:2,TOBRATROUGH:2,TOBRAPEAK:2,TOBRARND:2,AMIKACINPEAK:2,AMIKACINTROU:2,AMIKACIN:2, in the last 72 hours   Microbiology: Recent Results (from the past 720 hour(s))  CULTURE, BLOOD (ROUTINE X 2)     Status: Normal   Collection Time   05/07/12  1:15 PM      Component Value Range Status Comment   Specimen Description BLOOD HEMODIALYSIS CATHETER   Final    Special Requests BOTTLES DRAWN AEROBIC AND ANAEROBIC 10CC   Final    Culture  Setup Time 05/07/2012 17:57   Final    Culture NO GROWTH 5 DAYS   Final    Report Status 05/13/2012 FINAL   Final   CULTURE, BLOOD (ROUTINE X 2)     Status: Normal   Collection Time   05/07/12  1:20 PM      Component Value Range Status Comment   Specimen Description BLOOD HEMODIALYSIS CATHETER   Final    Special Requests BOTTLES DRAWN AEROBIC AND  ANAEROBIC 10CC   Final    Culture  Setup Time 05/07/2012 17:57   Final    Culture NO GROWTH 5 DAYS   Final    Report Status 05/13/2012 FINAL   Final   URINE CULTURE     Status: Normal   Collection Time   05/08/12 12:36 AM      Component Value Range Status Comment   Specimen Description URINE, CLEAN CATCH   Final    Special Requests NONE   Final    Culture  Setup Time 05/08/2012 13:44   Final    Colony Count >=100,000 COLONIES/ML   Final    Culture     Final    Value: Multiple bacterial morphotypes present, none predominant. Suggest appropriate recollection if clinically indicated.   Report Status 05/09/2012 FINAL   Final   CULTURE, BLOOD (ROUTINE X 2)     Status: Normal   Collection Time   05/10/12 10:42 AM      Component Value Range Status Comment   Specimen Description BLOOD RIGHT FOOT   Final    Special Requests BOTTLES DRAWN AEROBIC AND ANAEROBIC 10CC   Final    Culture  Setup Time 05/10/2012 18:08   Final    Culture NO GROWTH 5 DAYS   Final    Report Status 05/16/2012 FINAL   Final   CULTURE, BLOOD (ROUTINE X 2)     Status: Normal   Collection Time  05/10/12 10:48 AM      Component Value Range Status Comment   Specimen Description BLOOD LEFT FOOT   Final    Special Requests     Final    Value: BOTTLES DRAWN AEROBIC AND ANAEROBIC AERO 10CC ANA 8CC   Culture  Setup Time 05/10/2012 18:08   Final    Culture     Final    Value: STAPHYLOCOCCUS SPECIES (COAGULASE NEGATIVE)     Note: THE SIGNIFICANCE OF ISOLATING THIS ORGANISM FROM A SINGLE SET OF BLOOD CULTURES WHEN MULTIPLE SETS ARE DRAWN IS UNCERTAIN. PLEASE NOTIFY THE MICROBIOLOGY DEPARTMENT WITHIN ONE WEEK IF SPECIATION AND SENSITIVITIES ARE REQUIRED.     27 Note: Gram Stain Report Called to,Read Back By and Verified With: DR.HENSEAL AT 8:20PM 11 13 BY THOMI   Report Status 05/13/2012 FINAL   Final     Medical History: Past Medical History  Diagnosis Date  . Gallstone pancreatitis     chronic pancreatitis s/p resection  of gallbladder and panreas  . Coronary artery disease     s/p CABG 2008 with multiple PCIs  . Ischemic cardiomyopathy     Cath 04/2012, significant calcifications  . End stage renal disease     T,Th,Sat HD by [facility], h/o peritoneal dialysis prior; uses right chest cath for HD, left arm graft placed by [facility] but not ready for use  . Diabetes mellitus     Diet controlled  . Hypertension   . History of nonadherence to medical treatment   . Hyperlipidemia   . Systolic CHF     16/1096 echo EF 10%  . Anemia   . Pneumonia 02/2012  . Esophageal varices   . Bacteremia     enterobacter 09/2011, MRSA 09/2011  . Fatty liver   . GIB (gastrointestinal bleeding)     per pt history   . 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 Perforated bowel     09/2011-no surgical intervention  . NSTEMI (non-ST elevated myocardial infarction)     04/2012; Trop peaked to 1.39    Medications:  Prescriptions prior to admission  Medication Sig Dispense Refill  . aspirin EC 81 MG EC tablet Take 1 tablet (81 mg total) by mouth daily.  30 tablet  0  . carvedilol (COREG) 6.25 MG tablet Take 1 tablet (6.25 mg total) by mouth 2 (two) times daily with a meal.  60 tablet  3  . darbepoetin (ARANESP) 100 MCG/0.5ML SOLN Inject 100 mcg into the vein See admin instructions. In dialysis      . hydrALAZINE (APRESOLINE) 25 MG tablet Take 1 tablet (25 mg total) by mouth every 8 (eight) hours.  90 tablet  3  . HYDROmorphone (DILAUDID) 2 MG tablet Take 1 tablet (2 mg total) by mouth 3 (three) times daily. Takes along with methadone.  21 tablet  0  . isosorbide mononitrate (IMDUR) 30 MG 24 hr tablet Take 1 tablet (30 mg total) by mouth daily.  30 tablet  3  . Magnesium Oxide 420 MG TABS Take 1 tablet by mouth daily.      . methadone (DOLOPHINE) 5 MG tablet Take 1 tablet (5 mg total) by mouth 3 (three) times daily. Takes along with Dilaudid.  21 tablet  0  . multivitamin (RENA-VIT) TABS tablet Take 1 tablet by mouth  daily.      . Nutritional Supplements (FEEDING SUPPLEMENT, NEPRO CARB STEADY,) LIQD Take 237 mLs by mouth 2 (two) times daily with breakfast  and lunch.      . pantoprazole (PROTONIX) 40 MG tablet Take 40 mg by mouth daily.      . pravastatin (PRAVACHOL) 20 MG tablet Take 20 mg by mouth at bedtime.       . ranolazine (RANEXA) 500 MG 12 hr tablet Take 500 mg by mouth daily.       [name] senna (SENOKOT) 8.6 MG TABS Take 1 tablet by mouth 2 (two) times daily as needed. For constiption      . sevelamer (RENAGEL) 400 MG tablet Take 400 mg by mouth daily. With a meal       Assessment: [name] is a 61 yo F to start vancomycin and zosyn after blood cultures are drawn x 2 and cultures drawn from HD cath. She has a fever of 100.5 and hx of MRSA and enterobacter bacteremia.  Wt = 56.2 kg. ESRD pt on HD on TTS.  WBC 20.8.  Goal of Therapy:  Pre-HD vancomycin level 15-25 mcg/ml  Plan:  After cultures are drawn.  1. Vancomycin 1250 mg load, then 750 mg IV qHD after HD on TTS 2. Zosyn 2.25 gm IV q8h Herby Abraham, Pharm.D. [phone] 05/22/2012 10:16 PM

## 2012-05-22 NOTE — ED Notes (Signed)
Per EMS: pt coming from home with c/o chest pain. Chest pain awoke pt out of sleep. Denies n/v, diaphoresis. Right sided chest pain, radiates to right arm down to thumb. Pt given 324 asa and nitro x2. Pt pain decreased from 9 to 6. A&Ox4. Pt had similar symptoms 3 weeks ago. Skin is warm and dry, respirations equal and unlabored

## 2012-05-22 NOTE — ED Notes (Signed)
Patient transported to X-ray 

## 2012-05-22 NOTE — H&P (Signed)
Hospital Admission Note Date: 05/22/2012  Patient name: [name] Medical record number: [id number] Date of birth: 1951/02/07 Age: 61 y.o. Gender: female PCP: Jyl Heinz, MD  Medical Service: Internal Medicine Attending physician: Dr. Eben Burow    1st Contact: Dr. Loyal Buba 2nd Contact: Dr. Everardo Beals [phone] After 5 pm or weekends: 1st Contact:  Pager: [phone] 2nd Contact:  Pager: [phone]  Chief Complaint: Right chest pain, Right arm pain, Abdominal pain   History of Present Illness: 61 y.o PMH gallstone pancreatitis status post cholecystectomy, chronic pancreatitis status resection of portion of pancreas, CAD (s/p  CABG), ischemic cardiomyopathy status [employer] pacemaker/AICD with EF 10% in 04/2012, severe global hypokinesis, mild aortic stenosis, moderate to severe mitral regurgitation; ESRD on HD (TThSat), history of DM, HTN, HLD, anemia, esophageal varices, bacteremia (MRSA, enterobacter), Candida fungemia, history of perforated bowel, fatty liver and GI bleeding.    She presented to the ED today for right sided arm pain that woke her up at 6 am.  This has occurred twice previously in the past and does not happen every day.  Sensation was pressure like and stabbing in her armpit which radiated to her right thumb and became throbbing.  This pain was also associated with right chest pain without radiation.  She tried to rub the painful areas without relief.  Nothing made the pain worse.  She called EMS due to concern because she has a previous heart history.  Also she tried to locate Nitro pills but was unsuccessful.  EMS arrived and gave her 4 Aspirin then Nitroglycerin.  These interventions took her pain from a 9/10 to 7/10.  She received another Nitroglycerin in the ED which she states helped her pain.  ED also later gave her dilaudid 1 mg twice, Zofran 4 mg x 1, Phenergan 12.5 mg x 1.  Cardiac enzymes were trended x 2 and negative.  ROS: +100 lb weight loss in 1 year,  denies appetite changes, +chills, denies fever, denies diaphoresis, +nausea (not relieved by Zofran only Phenergan), denies vomiting, denies constipation or diarrhea, denies dysuria, denies cough and sob, denies sob with exertion, denies wounds to skin, +gait problems (uses walker or cane), +history of falls due to imbalance, +left arm swelling since graft placement but no change in size (increasing size and she saw a doctor Thursdays prior to admission), +clostrophobia.  She was given Aspirin 324 mg and Nitroglycerin x 2.  Her pain decreased from a 9 to a 6.  Of note, she had similar symptoms 3 weeks ago.  She had a cath 04/2012 which showed ischemic cardiomyopathy with dilated left ventricle and EF on 10%.  She follow with [facility].  She also follows with the [facility] hospital and at [facility].   She has had abdominal pain since 2003.  Within the last year her abdominal pain is worsening.  She has a history of gallstones and a stone lodged in a duct in the past and she had pancreatitis and necrosis.  Per the patient she has had 90% of her pancreas removed and only has 10% left.  She calls this pain "Joe".  Pain today is 8/10 bilateral lower abdomen.  She states it is stabbing, constant.  Her home pain medications help a little.  She is normally able to tolerate oral intake but has felt nauseated with eating today.  Lipase was 9 today.  She refused CT scan in the ED initially today.     She has decreased activities of daily living at home  and has been dropped by multiple home health agencies due to multiple admissions over the last year and she is not at home but home health is coming to her home tomorrow for RN, PT, she did not mention OT.    Ph[facility] [address] Meds: Current Outpatient Rx  Name  Route  Sig  Dispense  Refill  . ASPIRIN 81 MG PO TBEC   Oral   Take 1 tablet (81 mg total) by mouth daily.   30 tablet   0   . CARVEDILOL 6.25 MG PO TABS   Oral   Take 1  tablet (6.25 mg total) by mouth 2 (two) times daily with a meal.   60 tablet   3   . DARBEPOETIN ALFA-POLYSORBATE 100 MCG/0.5ML IJ SOLN   Intravenous   Inject 100 mcg into the vein See admin instructions. In dialysis         . HYDRALAZINE HCL 25 MG PO TABS   Oral   Take 1 tablet (25 mg total) by mouth every 8 (eight) hours.   90 tablet   3   . HYDROMORPHONE HCL 2 MG PO TABS   Oral   Take 1 tablet (2 mg total) by mouth 3 (three) times daily. Takes along with methadone.   21 tablet   0   . ISOSORBIDE MONONITRATE ER 30 MG PO TB24   Oral   Take 1 tablet (30 mg total) by mouth daily.   30 tablet   3   . MAGNESIUM OXIDE 420 MG PO TABS   Oral   Take 1 tablet by mouth daily.         [name] METHADONE HCL 5 MG PO TABS   Oral   Take 1 tablet (5 mg total) by mouth 3 (three) times daily. Takes along with Dilaudid.   21 tablet   0   . RENA-VITE PO TABS   Oral   Take 1 tablet by mouth daily.         [name] NEPRO/CARB STEADY PO LIQD   Oral   Take 237 mLs by mouth 2 (two) times daily with breakfast and lunch.         [name] PANTOPRAZOLE SODIUM 40 MG PO TBEC   Oral   Take 40 mg by mouth daily.         [name] PRAVASTATIN SODIUM 20 MG PO TABS   Oral   Take 20 mg by mouth at bedtime.          [name] RANOLAZINE ER 500 MG PO TB12   Oral   Take 500 mg by mouth daily.          . SENNA 8.6 MG PO TABS   Oral   Take 1 tablet by mouth 2 (two) times daily as needed. For constiption         . SEVELAMER HCL 400 MG PO TABS   Oral   Take 400 mg by mouth daily. With a meal           Allergies: Allergies as of 05/22/2012 - Review Complete 05/22/2012  Allergen Reaction Noted  . Morphine and related  12/01/2010  . Ace inhibitors Other (See Comments) 04/24/2012  . Codeine Itching 12/01/2010  . Tylenol (acetaminophen) Other (See Comments) 04/21/2012   Past Medical History  Diagnosis Date  . Gallstone pancreatitis     chronic pancreatitis s/p resection of gallbladder and panreas  .  Coronary artery disease     s/p CABG 2008  with multiple PCIs  . Ischemic cardiomyopathy     Cath 04/2012, significant calcifications  . End stage renal disease     T,Th,Sat HD by [facility], h/o peritoneal dialysis prior; uses right chest cath for HD, left arm graft placed by [facility] but not ready for use  . Diabetes mellitus     Diet controlled  . Hypertension   . History of nonadherence to medical treatment   . Hyperlipidemia   . Systolic CHF     96/0454 echo EF 10%  . Anemia   . Pneumonia 02/2012  . Esophageal varices   . Bacteremia     enterobacter 09/2011, MRSA 09/2011  . Fatty liver   . GIB (gastrointestinal bleeding)     per pt history   . 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 Perforated bowel     09/2011-no surgical intervention   Past Surgical History  Procedure Date  . Coronary artery bypass graft 2008  . Abdominal hysterectomy   . Tubal ligation   . Tonsillectomy   . Insert / replace / remove pacemaker     pacemaker ICD  . Esophagogastroduodenoscopy 09/15/2011    Procedure: ESOPHAGOGASTRODUODENOSCOPY (EGD);  Surgeon: Theda Belfast, MD;  Location: [facility] ENDOSCOPY;  Service: Endoscopy;  Laterality: N/A;  . Cholecystectomy 2003   Family History  Problem Relation Age of Onset  . Coronary artery disease Mother   . Hypertension Mother   . Diabetes Mother   . Diabetes Sister   . Anesthesia problems Neg Hx    History   Social History  . Marital Status: Single    Spouse Name: N/A    Number of Children: N/A  . Years of Education: N/A   Occupational History  . Not on file.   Social History Main Topics  . Smoking status: Former Smoker    Quit date: 06/15/2000  . Smokeless tobacco: Not on file  . Alcohol Use: No  . Drug Use: No  . Sexually Active: Yes   Other Topics Concern  . Not on file   Social History Narrative   The patient lives alone in [place].  She has a fiance.  She is a retired nurse-Psychiatric and ortho.  She is disabled and  receives most of her care from the [place], [place].  She was a Charity fundraiser in [employer].  She has 2 kids (boy, girl).  Denies alcohol, cigarettes, drugs.  She is on Methadone via Palliative Care.  Son-shawn-[phone] alone    Review of Systems: General: +100 lb weight loss in 1 year, denies appetite changes, +chills, denies fever, denies diaphoresis CV: +right chest pain GI/GU: +nausea (not relieved by Zofran only Phenergan), denies vomiting, denies constipation or diarrhea, denies dysuria Lungs: denies cough and sob, denies sob with exertion Skin: denies wounds to skin Neuro: +gait problems (uses walker or cane), +history of falls due to imbalance Extremities: +left arm swelling since graft placement but no change in size (increasing size and she saw a doctor Thursdays prior to admission). Psychiatry: +clostrophobia   Physical Exam: Vitals on exam 69/110/51 (66); 97%; 16 (Per patient baseline blood pressure is 100s/60s) Blood pressure 95/53, pulse 72, temperature 98 F (36.7 C), temperature source Oral, resp. rate 20, SpO2 100.00%. General:cachetic, pleasant, nad, alert and oriented x 3, talkative HEENT: Christopher/at, perrl b/l, no teeth CV:RRR no rubs, murmurs or gallops, ICD intact left chest, linear scar from chest to abdomen s/p CABG, right chest with HD cath intact no drainage or  evidence of infection.  Lungs:ctab Abdomen: scar to left lower abdomen from previous peritoneal dialysis catheter, linear scar to abdomen s/p hysterectomy, soft, normal bs, nondistended, mild ttp periumbilically and lower abdomen b/l  Extremities:1+ edema b/l extremities, 2+ DP/PT pulses, warm, dry no cyanosis or edema; left arm edematous  Neuro: CN 2-12 grossly intact, moving all 4 extremities Skin: no evidence of breakdown  Lab results: Basic Metabolic Panel:  Basename 05/22/12 0709  NA 135  K 3.3*  CL 96  CO2 30  GLUCOSE 114*  BUN 28*  CREATININE 3.62*  CALCIUM 10.5  MG --  PHOS --   Liver Function  Tests:  Basename 05/22/12 1246  AST 35  ALT 19  ALKPHOS 144*  BILITOT 0.3  PROT 6.4  ALBUMIN 1.9*    Basename 05/22/12 1246  LIPASE 9*  AMYLASE --   CBC:  Basename 05/22/12 0709  WBC 20.8*  NEUTROABS 18.3*  HGB 9.7*  HCT 31.3*  MCV 88.9  PLT 169   Cardiac Enzymes:  Basename 05/22/12 1608 05/22/12 0709  CKTOTAL -- --  CKMB -- --  CKMBINDEX -- --  TROPONINI <0.30 <0.30   Urinalysis: pending Misc. Labs: CBC with diff in the am, CMET in the am  Imaging results:  Dg Chest 2 View  05/22/2012  *RADIOLOGY REPORT*  Clinical Data: 61 year old female with chest pain.  CHEST - 2 VIEW  Comparison: 05/05/2012  Findings: Cardiomegaly, CABG changes, coronary stent, left-sided pacemaker / AICD and right central venous catheter with tip overlying the right atrium again noted. Pulmonary vascular congestion is present. Blunting of the left posterior costophrenic angle is unchanged. There is no evidence of focal airspace disease, pulmonary edema, suspicious pulmonary nodule/mass, pleural effusion, or pneumothorax. No acute bony abnormalities are identified.  IMPRESSION: Cardiomegaly with pulmonary vascular congestion.   Original Report Authenticated By: Harmon Pier, M.D.     Other results: EKG: rate 75, LAD, normal intervals, no LVH, no change in ST elevations in III, V1, V2 V3 (compared to 04/2012)  Assessment & Plan by Problem: 61 y.o female extensive PMH and significant cardiac history presents for chest pain, abdominal pain was found to have leukocytosis.   1. Chest pain Could be cardiac (typical angina (with symptoms of pressure, radiation to arm, relief with NTG and nausea) or MI) versus noncardiac (musculoskeletal).  Her troponin has been negative x 3.  Chest pain was resolved after 2 Nitroglycerin administrations.  She does not have any acute ST changes.  TIMI score is 3 meaning her risk score is 13% meaning her risk in 14 days of having and CV related event or death is 13%.  She  has had CABG in 2008 and restenosis of graft 6 months after the procedure.  Previous cath LAD with 70% diffuse proximal narrowing prior to patent stent; intermediate vessels 50% proximal stenosis follow by 60% stenosis at bifurcations.  Circumflex 80% proximal AV grove stenosis and 80% stenosis in marginal vessel; RCA diffuse 80% prox stenosis as well as ostial 60 and 90% stenosis in anterior RV marginal branches.  Noncardiac etiology could be secondary to possible cervical radiculopathy or rotator cuff tendonitis or tear in which neck/shoulder imaging may be warranted in the future.   Plan -She will continued on Imdur and Ranexa, Coreg home doses, continue NTG prn, Continue ASA -consider imaging of neck/shoulder in the future -Admit for observation    2, Abdominal pain Differentials include pain secondary to chronic pancreatitis, pseudocysts or urinary tract infection or gastritis.  She has  a history of ascites and gets a paracentesis q 2weeks at [facility] (per patient)  Though ascites is less likely because abdomen does not seem distended and she is afebrile.  She has elevated liver enzymes this admission Alkaline phosphatase 144.  She has had elevations in the past.    Plan   -Elevated Alkaline phosphatase. Will repeat CMET in the am.  If still elevated will get a GGT -Reviewed previous CT scans with Radiologist.  He recommended a CT scan abdomen/pelvis with contrast to evaluate for necrosis but the patient pleasantly refused -Continue Dilaudid 2mg  tid, Methadone 5 mg tid    3. LUE edema -s/p graft placement at [facility].  Left upper extremity US done on 05/10/12 without evidence of thrombosis with patent graft.   -No change in left upper extremity edema   4. Leukocytosis  -will trend CBC in the am.  Currently no source of infection but trying to obtain urine though patient is not symptomatic.  This could be a stress reaction.  -pending UA, consider blood cultures if she is  febrile  5. Hypertension -Continue Imdur, Hydralazine, and Coreg  6. History of Diabetes -will check cbg ACHS  7. ESRD on HD TThSat  8. Normocytic anemia -H/H 9.7/31.3 likely related to ESRD.  Iron was low, ferritin was high in 02/2012.    9. DVT Px  -Heparin  10. F/E/N -will monitor labs (Hypokalemia 3.3)  -will try on a renal diet for now as tolerated-If cant tolerate de-escalate diet.    Dispo: Disposition is deferred at this time, awaiting improvement of current medical problems. Anticipated discharge in approximately 1day(s).   The patient does have a current PCP (ARMOUR, ROSS B, MD), therefore will requiring OPC follow-up after discharge.   The patient does not have transportation limitations that hinder transportation to clinic appointments.  SignedAnnett Gula [phone] 05/22/2012, 5:32 PM

## 2012-05-22 NOTE — ED Provider Notes (Signed)
History     CSN: [id number]  Arrival date & time 05/22/12  1478   First MD Initiated Contact with Patient 05/22/12 0701      Chief Complaint  Patient presents with  . Chest Pain    (Consider location/radiation/quality/duration/timing/severity/associated sxs/prior treatment) HPI  [name] is a 61 y.o. female with past medical history significant for non-insulin-dependent diabetes, hypertension, ESRD on dialysis, systolic heart failure and multiple prior MIs with [employer] pacemaker/AICD complaining of right arm pain radiating down to the thumb. This is the exact same sensation she had with her prior MIs. Onset was acute last night and woke her from sleep. Pain was 10 out of 10. She looked for nitroglycerin but had only expired Rx.  Pain is relived with 2SL NTG by EMS (now 7/10). She also had full dose aspirin en route. Cath from 05/06/2012 shows ischemic cardiomyopathy with dilated left ventricle and EF of 10%. 4 vessel CABG in 2008.  She is seen by Dr. Tresa Endo of [facility]. Patient denies any shortness of breath abdominal pain, endorses nausea. Pt takes methadone and dilaudid for chronic pancreatitis.  Patient was fully dialyzed yesterday.  Past Medical History  Diagnosis Date  . Gallstone pancreatitis   . Coronary artery disease     s/p CABG 2008 with multiple PCIs  . Ischemic cardiomyopathy   . End stage renal disease   . Diabetes mellitus   . Hypertension   . History of nonadherence to medical treatment   . Hyperlipidemia   . Renal insufficiency   . CHF (congestive heart failure)   . Dialysis patient   . Anemia   . Pneumonia 02/2012    Past Surgical History  Procedure Date  . Coronary artery bypass graft 2008  . Abdominal hysterectomy   . Tubal ligation   . Tonsillectomy   . Insert / replace / remove pacemaker     pacemaker ICD  . Esophagogastroduodenoscopy 09/15/2011    Procedure: ESOPHAGOGASTRODUODENOSCOPY (EGD);  Surgeon: Theda Belfast, MD;  Location: [facility]  ENDOSCOPY;  Service: Endoscopy;  Laterality: N/A;  . Cholecystectomy 2003    Family History  Problem Relation Age of Onset  . Coronary artery disease Mother   . Hypertension Mother   . Diabetes Mother   . Diabetes Sister   . Anesthesia problems Neg Hx     History  Substance Use Topics  . Smoking status: Former Smoker    Quit date: 06/15/2000  . Smokeless tobacco: Not on file  . Alcohol Use: No    OB History    Grav Para Term Preterm Abortions TAB SAB Ect Mult Living                  Review of Systems  Allergies  Morphine and related; Ace inhibitors; Codeine; Lisinopril; and Tylenol  Home Medications   Current Outpatient Rx  Name  Route  Sig  Dispense  Refill  . ASPIRIN 81 MG PO TBEC   Oral   Take 1 tablet (81 mg total) by mouth daily.   30 tablet   0   . CARVEDILOL 6.25 MG PO TABS   Oral   Take 1 tablet (6.25 mg total) by mouth 2 (two) times daily with a meal.   60 tablet   3   . DARBEPOETIN ALFA-POLYSORBATE 100 MCG/0.5ML IJ SOLN   Intravenous   Inject 0.5 mLs (100 mcg total) into the vein every Monday with hemodialysis.   4.2 mL      . HYDRALAZINE HCL  25 MG PO TABS   Oral   Take 1 tablet (25 mg total) by mouth every 8 (eight) hours.   90 tablet   3   . HYDROMORPHONE HCL 2 MG PO TABS   Oral   Take 1 tablet (2 mg total) by mouth 3 (three) times daily. Takes along with methadone.   21 tablet   0   . ISOSORBIDE MONONITRATE ER 30 MG PO TB24   Oral   Take 1 tablet (30 mg total) by mouth daily.   30 tablet   3   . MAGNESIUM OXIDE 420 MG PO TABS   Oral   Take 1 tablet by mouth daily.         [name] METHADONE HCL 5 MG PO TABS   Oral   Take 1 tablet (5 mg total) by mouth 3 (three) times daily. Takes along with Dilaudid.   21 tablet   0   . RENA-VITE PO TABS   Oral   Take 1 tablet by mouth daily.         [name] NEPRO/CARB STEADY PO LIQD   Oral   Take 237 mLs by mouth 2 (two) times daily with breakfast and lunch.         [name] PANTOPRAZOLE SODIUM  40 MG PO TBEC   Oral   Take 40 mg by mouth daily.         [name] PRAVASTATIN SODIUM 20 MG PO TABS   Oral   Take 20 mg by mouth at bedtime.          [name] RANOLAZINE ER 500 MG PO TB12   Oral   Take 500 mg by mouth daily.          . SENNA 8.6 MG PO TABS   Oral   Take 1 tablet by mouth 2 (two) times daily.         [name] SEVELAMER HCL 400 MG PO TABS   Oral   Take 400 mg by mouth daily.           BP 109/69  Pulse 81  Temp 98 F (36.7 C) (Oral)  Resp 16  SpO2 97%  Physical Exam  Nursing note and vitals reviewed. Constitutional: She is oriented to person, place, and time. She appears well-developed and well-nourished. No distress.  HENT:  Head: Normocephalic.  Mouth/Throat: Oropharynx is clear and moist.  Eyes: Conjunctivae normal and EOM are normal. Pupils are equal, round, and reactive to light.  Neck: Normal range of motion.  Cardiovascular: Normal rate, regular rhythm and normal heart sounds.   Pulmonary/Chest: Effort normal and breath sounds normal. No stridor. No respiratory distress. She has no wheezes. She has no rales. She exhibits no tenderness.       Median sternotomy scar, defibrillator, right Port-A-Cath  Abdominal: Soft. Bowel sounds are normal. She exhibits no distension and no mass. There is tenderness. There is no rebound and no guarding.       Mild b/l upper quadrant TTP  Musculoskeletal: Normal range of motion. She exhibits edema.       Significant edema to left arm. As per patient this is a complication of graft placement by [facility] 6 weeks ago she is followed by them and has had an interventional radiology study to further define the pathology.  Neurological: She is alert and oriented to person, place, and time.  Psychiatric: She has a normal mood and affect.    ED Course  Procedures (including critical care time)  1478 patient reports exacerbation of chronic pancreatitis pain. I will give 1mg  of hydromorphone. Patient states states she  normally gets Dilaudid while in the hospital. She's not had a allergic reaction to it.   Labs Reviewed  CBC WITH DIFFERENTIAL - Abnormal; Notable for the following:    WBC 20.8 (*)     RBC 3.52 (*)     Hemoglobin 9.7 (*)     HCT 31.3 (*)     RDW 17.3 (*)     Neutrophils Relative 88 (*)     Neutro Abs 18.3 (*)     Lymphocytes Relative 5 (*)     Monocytes Absolute 1.3 (*)     All other components within normal limits  BASIC METABOLIC PANEL - Abnormal; Notable for the following:    Potassium 3.3 (*)     Glucose, Bld 114 (*)     BUN 28 (*)     Creatinine, Ser 3.62 (*)     GFR calc non Af Amer 12 (*)     GFR calc Af Amer 14 (*)     All other components within normal limits  LIPASE, BLOOD - Abnormal; Notable for the following:    Lipase 9 (*)     All other components within normal limits  HEPATIC FUNCTION PANEL - Abnormal; Notable for the following:    Albumin 1.9 (*)     Alkaline Phosphatase 144 (*)     All other components within normal limits  TROPONIN I  POCT I-STAT TROPONIN I  URINALYSIS, ROUTINE W REFLEX MICROSCOPIC   Dg Chest 2 View  05/22/2012  *RADIOLOGY REPORT*  Clinical Data: 61 year old female with chest pain.  CHEST - 2 VIEW  Comparison: 05/05/2012  Findings: Cardiomegaly, CABG changes, coronary stent, left-sided pacemaker / AICD and right central venous catheter with tip overlying the right atrium again noted. Pulmonary vascular congestion is present. Blunting of the left posterior costophrenic angle is unchanged. There is no evidence of focal airspace disease, pulmonary edema, suspicious pulmonary nodule/mass, pleural effusion, or pneumothorax. No acute bony abnormalities are identified.  IMPRESSION: Cardiomegaly with pulmonary vascular congestion.   Original Report Authenticated By: Harmon Pier, M.D.      Date: 05/22/2012  Rate: 79  Rhythm: normal sinus rhythm  QRS Axis: left  Intervals: normal  ST/T Wave abnormalities: nonspecific ST/T changes  Conduction  Disutrbances:left bundle branch block  Narrative Interpretation:   Old EKG Reviewed: unchanged   1. Chest pain   2. Leukocytosis   3. Abdominal pain       MDM  EKG shows left bundle branch block block non-ischemic. Her troponin is negative.  Significant leukocytosis of 20.8.  Cardiology consult from Dr. Rennis Golden appreciated: He isreassured by recent clean cath does not feel her pain is cardiac in nature.  This is a shared visit with attending Dr. Manus Gunning. Although there is significant edema and swelling in the left arm we do not believe that to be the source of her leukocytosis. There is very minimal warmth to the area. Patient also had an interventional radiology assessment 2 days ago at [facility], records are not yet accessible on care anywhere.  Patient is very tender to palpation of the upper abdomen a CT abdomen and pelvis is ordered however patient refuses this because of both uneasiness about drinking the contrast and laying flat.  Dr. Everardo Beals will admit the pt.   Joni Reining Rice Walsh, PA-C 05/22/12 1440

## 2012-05-22 NOTE — ED Notes (Signed)
Pt knows that urine is needed 

## 2012-05-22 NOTE — ED Notes (Signed)
Bladder scan completed on pt. Only showing 29 ml in bladder at this time. PA made aware. No further orders received at this time.

## 2012-05-22 NOTE — ED Notes (Signed)
Pt back from x-ray.

## 2012-05-23 ENCOUNTER — Encounter (HOSPITAL_COMMUNITY): Payer: Self-pay | Admitting: General Practice

## 2012-05-23 DIAGNOSIS — D72829 Elevated white blood cell count, unspecified: Secondary | ICD-10-CM

## 2012-05-23 DIAGNOSIS — R109 Unspecified abdominal pain: Secondary | ICD-10-CM

## 2012-05-23 DIAGNOSIS — R609 Edema, unspecified: Secondary | ICD-10-CM

## 2012-05-23 DIAGNOSIS — R509 Fever, unspecified: Secondary | ICD-10-CM | POA: Diagnosis not present

## 2012-05-23 DIAGNOSIS — K659 Peritonitis, unspecified: Secondary | ICD-10-CM

## 2012-05-23 LAB — COMPREHENSIVE METABOLIC PANEL
ALT: 17 U/L (ref 0–35)
AST: 22 U/L (ref 0–37)
CO2: 27 mEq/L (ref 19–32)
Calcium: 11 mg/dL — ABNORMAL HIGH (ref 8.4–10.5)
Chloride: 94 mEq/L — ABNORMAL LOW (ref 96–112)
GFR calc non Af Amer: 10 mL/min — ABNORMAL LOW (ref >90)
Sodium: 133 mEq/L — ABNORMAL LOW (ref 135–145)

## 2012-05-23 LAB — CBC WITH DIFFERENTIAL/PLATELET
Basophils Relative: 0 % (ref 0–1)
Eosinophils Absolute: 0.1 10*3/uL (ref 0.0–0.7)
Eosinophils Relative: 1 % (ref 0–5)
Hemoglobin: 10.9 g/dL — ABNORMAL LOW (ref 12.0–15.0)
MCH: 27.5 pg (ref 26.0–34.0)
MCHC: 30.6 g/dL (ref 30.0–36.0)
Monocytes Absolute: 0.8 10*3/uL (ref 0.1–1.0)
Monocytes Relative: 6 % (ref 3–12)
Neutrophils Relative %: 82 % — ABNORMAL HIGH (ref 43–77)

## 2012-05-23 LAB — GAMMA GT: GGT: 40 U/L (ref 7–51)

## 2012-05-23 LAB — URINE CULTURE
Colony Count: NO GROWTH
Culture: NO GROWTH

## 2012-05-23 LAB — SURGICAL PCR SCREEN: Staphylococcus aureus: NEGATIVE

## 2012-05-23 MED ORDER — ONDANSETRON HCL 4 MG PO TABS: 4.0000 mg | ORAL_TABLET | Freq: Four times a day (QID) | ORAL | Status: DC | PRN

## 2012-05-23 MED ORDER — SEVELAMER HCL 400 MG PO TABS
400.0000 mg | ORAL_TABLET | Freq: Every day | ORAL | Status: DC
  Administered 2012-05-23 – 2012-05-27 (×3): 400 mg via ORAL
  Filled 2012-05-23 (×7): qty 1

## 2012-05-23 MED ORDER — NEPRO/CARBSTEADY PO LIQD
237.0000 mL | Freq: Two times a day (BID) | ORAL | Status: DC
  Administered 2012-05-23 – 2012-05-27 (×8): 237 mL via ORAL
  Filled 2012-05-23 (×12): qty 237

## 2012-05-23 MED ORDER — CAMPHOR-MENTHOL 0.5-0.5 % EX LOTN
1.0000 "application " | TOPICAL_LOTION | Freq: Three times a day (TID) | CUTANEOUS | Status: DC | PRN
  Filled 2012-05-23: qty 222

## 2012-05-23 MED ORDER — CALCIUM CARBONATE 1250 MG/5ML PO SUSP
500.0000 mg | Freq: Four times a day (QID) | ORAL | Status: DC | PRN
  Filled 2012-05-23: qty 5

## 2012-05-23 MED ORDER — RENA-VITE PO TABS
1.0000 | ORAL_TABLET | Freq: Every day | ORAL | Status: DC
  Administered 2012-05-23 – 2012-05-27 (×5): 1 via ORAL
  Filled 2012-05-23 (×5): qty 1

## 2012-05-23 MED ORDER — ONDANSETRON HCL 4 MG/2ML IJ SOLN
4.0000 mg | Freq: Four times a day (QID) | INTRAMUSCULAR | Status: DC | PRN
  Administered 2012-05-26: 4 mg via INTRAVENOUS
  Filled 2012-05-23: qty 2

## 2012-05-23 MED ORDER — DOCUSATE SODIUM 283 MG RE ENEM
1.0000 | ENEMA | RECTAL | Status: DC | PRN
  Filled 2012-05-23: qty 1

## 2012-05-23 MED ORDER — VANCOMYCIN HCL 500 MG IV SOLR
500.0000 mg | INTRAVENOUS | Status: DC
  Administered 2012-05-24 – 2012-05-26 (×2): 500 mg via INTRAVENOUS
  Filled 2012-05-23 (×5): qty 500

## 2012-05-23 MED ORDER — HYDROXYZINE HCL 25 MG PO TABS
25.0000 mg | ORAL_TABLET | Freq: Three times a day (TID) | ORAL | Status: DC | PRN
  Filled 2012-05-23: qty 1

## 2012-05-23 MED ORDER — SORBITOL 70 % SOLN
30.0000 mL | Status: DC | PRN
  Filled 2012-05-23: qty 30

## 2012-05-23 MED ORDER — ZOLPIDEM TARTRATE 5 MG PO TABS: 5.0000 mg | ORAL_TABLET | Freq: Every evening | ORAL | Status: DC | PRN

## 2012-05-23 NOTE — H&P (Signed)
Internal Medicine Attending Admission Note Date: 05/23/2012  Patient name: [name] Medical record number: [id number] Date of birth: September 19, 1950 Age: 61 y.o. Gender: female  I saw and evaluated the patient. I reviewed the resident's note and I agree with the resident's findings and plan as documented in the resident's note.  Chief Complaint(s): Chest pain  History - key components related to admission: 61 year old female with PMH as described in the chart came to ER with chief complaint of chest pain. Pain woke her up from sleep, 9/10 in intensity, pressure and stabbing type sensation, radiated to right thumb, no exacerbating or relieving factors. Patient said that the pain felt similar to her previous chest pains when she had heart attack.   No palpitations, fevers, nausea, vomiting, diarrhea, constipation, change in dietary pattern noted. No new medications and she has been compliant with all her medications.  10 point ROS was done and is negative otherwise.  Physical Exam - key components related to admission:  Filed Vitals:   05/22/12 1842 05/22/12 2035 05/23/12 0000 05/23/12 0556  BP: 126/76 122/73 124/92 122/81  Pulse: 78 95 84 72  Temp: 98 F (36.7 C) 100.5 F (38.1 C) 98.6 F (37 C) 98.4 F (36.9 C)  TempSrc: Oral Oral Oral Oral  Resp: 18 18 18 18   Height: 5\' 4"  (1.626 m)     Weight: 123 lb 12.8 oz (56.155 kg)   125 lb 1.6 oz (56.745 kg)  SpO2: 100% 98% 97% 99%  BP 122/81  Pulse 72  Temp 98.4 F (36.9 C) (Oral)  Resp 18  Ht 5\' 4"  (1.626 m)  Wt 125 lb 1.6 oz (56.745 kg)  BMI 21.47 kg/m2  SpO2 99%  General Appearance:    Alert, cooperative, no distress, appears stated age  Head:    Normocephalic, without obvious abnormality, atraumatic  Eyes:    PERRL, conjunctiva/corneas clear, EOM's intact, fundi    benign, both eyes  Nose:   Nares normal, septum midline, mucosa normal, no drainage    or sinus tenderness  Throat:   Lips, mucosa, and tongue normal; teeth and  gums normal  Neck:   Supple, symmetrical, trachea midline, no adenopathy;    thyroid:  no enlargement/tenderness/nodules; no carotid   bruit or JVD  Back:     Symmetric, no curvature, ROM normal, no CVA tenderness  Lungs:     Clear to auscultation bilaterally, respirations unlabored  Chest Wall:    Right ant chest potacath present   Heart:    Regular rate and rhythm, S1 and S2 normal,flow systolic murmur best heard at left sternal border, no rub  or gallop  Abdomen:     Soft, non-tender, bowel sounds active all four quadrants,    no masses, no organomegaly  Extremities:   2+ pitting edema and swelling over entire left arm and forearm, atraumatic, no cyanosis or edema in lower extremities  Pulses:   2+ and symmetric all extremities  Skin:   Skin color, texture, turgor normal, no rashes or lesions  Lymph nodes:   Cervical, supraclavicular, and axillary nodes normal  Neurologic:   CNII-XII intact, normal strength, sensation and reflexes    throughout     Lab results:   Basic Metabolic Panel:  Basename 05/23/12 0715 05/22/12 0709  NA 133* 135  K 3.9 3.3*  CL 94* 96  CO2 27 30  GLUCOSE 122* 114*  BUN 39* 28*  CREATININE 4.42* 3.62*  CALCIUM 11.0* 10.5  MG -- --  PHOS -- --   Liver Function Tests:  [facility] 05/23/12 0715 05/22/12 1246  AST 22 35  ALT 17 19  ALKPHOS 172* 144*  BILITOT 0.3 0.3  PROT 6.8 6.4  ALBUMIN 2.0* 1.9*    Basename 05/22/12 1246  LIPASE 9*  AMYLASE --   CBC:  Basename 05/23/12 0715 05/22/12 0709  WBC 13.1* 20.8*  NEUTROABS 10.7* 18.3*  HGB 10.9* 9.7*  HCT 35.6* 31.3*  MCV 89.7 88.9  PLT 172 169   Cardiac Enzymes:  Basename 05/22/12 1608 05/22/12 0709  CKTOTAL -- --  CKMB -- --  CKMBINDEX -- --  TROPONINI <0.30 <0.30   CBG:  Basename 05/23/12 1140  GLUCAP 120*     Imaging results:  Dg Chest 2 View  05/22/2012  *RADIOLOGY REPORT*  Clinical Data: 61 year old female with chest pain.  CHEST - 2 VIEW  Comparison: 05/05/2012   Findings: Cardiomegaly, CABG changes, coronary stent, left-sided pacemaker / AICD and right central venous catheter with tip overlying the right atrium again noted. Pulmonary vascular congestion is present. Blunting of the left posterior costophrenic angle is unchanged. There is no evidence of focal airspace disease, pulmonary edema, suspicious pulmonary nodule/mass, pleural effusion, or pneumothorax. No acute bony abnormalities are identified.  IMPRESSION: Cardiomegaly with pulmonary vascular congestion.   Original Report Authenticated By: Harmon Pier, M.D.     Other results: EKG: Patient new ST wave changes in lateral leads I and aVL, otherwise EKG is unchanged from previous EKG  Assessment & Plan by Problem:  Principal Problem:  *Ischemic cardiomyopathy, EF 20-25% echo April 2013, now 10% by cath (05/06/12) Active Problems:  Hypertension  Pancreatitis, chronic  Left arm swelling  Fever  Abdominal pain  Leukocytosis  Ms. Mersman is a 61 year old female with past medical history significant for end-stage renal disease on hemodialysis, ischemic cardiomyopathy, coronary artery disease, systolic congestive heart failure with ejection fraction 10%, diabetes, hypertension and recent hospitalization for about 6 months at [facility] from January 2000 13th of June 2013 with multiple bacteremias and fungemias was admitted with right-sided chest pain. Patient's history is consistent with typical angina symptoms. She is currently without any cardiac enzyme elevation and I would call it unstable angina at this time. We will continue to monitor her EKGs and cardiac enzymes at this time. We would maximize medical therapy with aspirin, beta blockers, ACE inhibitors, nitrates, ranolazine. Patient is currently chest pain-free. In addition to her chest pain patient also spiked a fever last night and also has leukocytosis on admission. Patient could have hemodialysis catheter as source of her infection. We have drawn  blood cultures and started the patient on bank and Zosyn empirically. It is extremely difficult to evaluate patient for a new heart murmur since she has a flow murmur due to AV graft. We may consider doing a transesophageal echocardiogram if the suspicion is a high. I would involve ID to assist with diagnosis and management of this patient. Patient has had multiple bacteremias and fungemia in the past and could very well be harboring an infection. We will try to obtain a CT abdomen and pelvis as part of a workup for this unknown fever with leukocytosis. Patient has chronic abdominal pain which she calls "Joe".  Rest of the medical management as per resident note.  Lars Mage MD Faculty-Internal Medicine Residency Program Pager [phone]

## 2012-05-23 NOTE — Consult Note (Signed)
I have personally seen and examined this patient and agree with the assessment/plan as outlined above by [facility] PA. [name] refused HD today stating that she will go tomorrow according to her usual OP schedule. Ongoing work up of fever, CP evaluation negative for ACS. [name] K.,MD 05/23/2012 2:08 PM

## 2012-05-23 NOTE — Consult Note (Signed)
[name] Renal Consultation Note    Indication for Consultation:  Management of ESRD/hemodialysis; anemia, hypertension/volume and secondary hyperparathyroidism  HPI: [name] is a 61 y.o. female with ESRD thought secondary to HTN and DM on HD since February 2012 who transferred to [name] in November of this year, though she is well known to [place] due to her multiple hospitalizations and complex medical problems.  She was discharged 05/11/2012 following an admission for NSTEMI with the finding of a decline in EF to 10% by cath 05/06/12.  She reports the onset of CP with pain in right arm radiating into her right thumb yesterday, just like previous admission for NSTEMI.  She states she didn't take NTG at home because all her bottles had expired.  CP was associated with some SOB, but no diaphoresis and subsided after NTG via EMS. Last Thursday she had a IR study of her left arm without PTA and said the radiologist said "he'd never seen anything like that" but wouldn't tell her what he saw.  He referred her back to Dr. Hollace [name], whose office is to call her.   She continues to have pain with "[name]" - her pancreas and swelling of "[name]" - her left arm. She denies fever or chills. She has itching under her catheter dressing. She has no dysuria. She denies fever, cough, nausea or vomiting. Had a good BM today.  Prefers to drink Nepro instead of eating breakfast.  She walks with a cane or walker and reports LE swelling has resolved.  She is also quite talkative today about all the things she is unhappy about at her current HD [name], but she " loves all the doctors".  [name] and is closer to [name] and wants to transfer there, but has been told they are not taking new patients until the first of the year.  Past Medical History  Diagnosis Date  . Gallstone pancreatitis     chronic pancreatitis s/p resection of gallbladder and panreas  . Coronary artery  disease     s/p CABG 2008 with multiple PCIs  . Ischemic cardiomyopathy     Cath 04/2012, significant calcifications  . End stage renal disease     T,Th,Sat HD by [name], h/o peritoneal dialysis prior; uses right chest cath for HD, left arm graft placed by [name] but not ready for use  . Diabetes mellitus     Diet controlled  . Hypertension   . History of nonadherence to medical treatment   . Hyperlipidemia   . Systolic CHF     45/4098 echo EF 10%  . Anemia   . Pneumonia 02/2012  . Esophageal varices   . Bacteremia     enterobacter 09/2011, MRSA 09/2011  . Fatty liver   . GIB (gastrointestinal bleeding)     per pt history   . Fungemia     [name] 09/2011  . Perforated bowel     09/2011-no surgical intervention  . NSTEMI (non-ST elevated myocardial infarction)     04/2012; Trop peaked to 1.39   Past Surgical History  Procedure Date  . Coronary artery bypass graft 2008  . Abdominal hysterectomy   . Tubal ligation   . Tonsillectomy   . Insert / replace / remove pacemaker     pacemaker ICD  . Esophagogastroduodenoscopy 09/15/2011    Procedure: ESOPHAGOGASTRODUODENOSCOPY (EGD);  Surgeon: Theda Belfast, MD;  Location: [facility] ENDOSCOPY;  Service: Endoscopy;  Laterality: N/A;  . Cholecystectomy 2003   Family History  Problem Relation Age of Onset  . Coronary artery disease Mother   . Hypertension Mother   . Diabetes Mother   . Diabetes Sister   . Anesthesia problems Neg Hx    Social History:  reports that she quit smoking about 11 years ago. She does not have any smokeless tobacco history on file. She reports that she does not drink alcohol or use illicit drugs. Allergies  Allergen Reactions  . Morphine And Related     SOB when given IV once  . Ace Inhibitors Other (See Comments)    Unknown reaction but her physician stated that she can't take it (specifically lisinopril)  . Codeine Itching  . Tylenol (Acetaminophen) Other (See Comments)    Patient  states that the doctor told her she has a Fatty Liver.   Prior to Admission medications   Medication Sig Start Date End Date Taking? Authorizing Provider  aspirin EC 81 MG EC tablet Take 1 tablet (81 mg total) by mouth daily. 05/11/12  Yes Larey Seat, MD  carvedilol (COREG) 6.25 MG tablet Take 1 tablet (6.25 mg total) by mouth 2 (two) times daily with a meal. 05/11/12  Yes Larey Seat, MD  darbepoetin (ARANESP) 100 MCG/0.5ML SOLN Inject 100 mcg into the vein See admin instructions. In dialysis 04/05/12  Yes Joseph Art, DO  hydrALAZINE (APRESOLINE) 25 MG tablet Take 1 tablet (25 mg total) by mouth every 8 (eight) hours. 05/11/12  Yes Larey Seat, MD  HYDROmorphone (DILAUDID) 2 MG tablet Take 1 tablet (2 mg total) by mouth 3 (three) times daily. Takes along with methadone. 03/04/12  Yes Srikar Cherlynn Kaiser, MD  isosorbide mononitrate (IMDUR) 30 MG 24 hr tablet Take 1 tablet (30 mg total) by mouth daily. 05/11/12  Yes Larey Seat, MD  Magnesium Oxide 420 MG TABS Take 1 tablet by mouth daily.   Yes Historical Provider, MD  methadone (DOLOPHINE) 5 MG tablet Take 1 tablet (5 mg total) by mouth 3 (three) times daily. Takes along with Dilaudid. 03/04/12  Yes Srikar Cherlynn Kaiser, MD  multivitamin (RENA-VIT) TABS tablet Take 1 tablet by mouth daily. 10/14/11  Yes Clanford Cyndie Mull, MD  Nutritional Supplements (FEEDING SUPPLEMENT, NEPRO CARB STEADY,) LIQD Take 237 mLs by mouth 2 (two) times daily with breakfast and lunch. 03/04/12  Yes Srikar Cherlynn Kaiser, MD  pantoprazole (PROTONIX) 40 MG tablet Take 40 mg by mouth daily.   Yes Historical Provider, MD  pravastatin (PRAVACHOL) 20 MG tablet Take 20 mg by mouth at bedtime.    Yes Historical Provider, MD  ranolazine (RANEXA) 500 MG 12 hr tablet Take 500 mg by mouth daily.    Yes Historical Provider, MD  senna (SENOKOT) 8.6 MG TABS Take 1 tablet by mouth 2 (two) times daily as needed. For constiption   Yes Historical Provider, MD  sevelamer (RENAGEL) 400 MG tablet Take  400 mg by mouth daily. With a meal   Yes Historical Provider, MD   Current Facility-Administered Medications  Medication Dose Route Frequency Provider Last Rate Last Dose  . aspirin EC tablet 81 mg  81 mg Oral Daily Belia Heman, MD   81 mg at 05/23/12 0912  . carvedilol (COREG) tablet 6.25 mg  6.25 mg Oral BID WC Belia Heman, MD   6.25 mg at 05/23/12 0704  . enoxaparin (LOVENOX) injection 30 mg  30 mg Subcutaneous Q24H Neema Davina Poke, MD  30 mg at 05/22/12 2141  . feeding supplement (NEPRO CARB STEADY) liquid 237 mL  237 mL Oral BID BM Annett Gula, MD      . hydrALAZINE (APRESOLINE) tablet 25 mg  25 mg Oral Q8H Neema Davina Poke, MD   25 mg at 05/23/12 0704  . [COMPLETED] HYDROmorphone (DILAUDID) injection 0.5 mg  0.5 mg Intravenous Once Belia Heman, MD   0.5 mg at 05/22/12 1631  . [COMPLETED] HYDROmorphone (DILAUDID) injection 1 mg  1 mg Intravenous Once [employer], PA-C   1 mg at 05/22/12 1220  . HYDROmorphone (DILAUDID) tablet 2 mg  2 mg Oral TID Belia Heman, MD   2 mg at 05/23/12 0932  . isosorbide mononitrate (IMDUR) 24 hr tablet 30 mg  30 mg Oral Daily Belia Heman, MD   30 mg at 05/23/12 0913  . methadone (DOLOPHINE) tablet 5 mg  5 mg Oral TID Belia Heman, MD   5 mg at 05/23/12 0932  . nitroGLYCERIN (NITROSTAT) SL tablet 0.4 mg  0.4 mg Sublingual Q5 min PRN Neema Davina Poke, MD      . pantoprazole (PROTONIX) EC tablet 40 mg  40 mg Oral Daily Belia Heman, MD   40 mg at 05/23/12 0912  . piperacillin-tazobactam (ZOSYN) IVPB 2.25 g  2.25 g Intravenous Q8H Herby Abraham, PHARMD   2.25 g at 05/23/12 0705  . ranolazine (RANEXA) 12 hr tablet 500 mg  500 mg Oral Daily Belia Heman, MD   500 mg at 05/23/12 0913  . sevelamer (RENAGEL) tablet 400 mg  400 mg Oral Q breakfast Belia Heman, MD   400 mg at 05/23/12 0932  . simvastatin (ZOCOR) tablet 10 mg  10 mg Oral q1800 Neema K Sharda, MD      . sodium chloride 0.9 % injection 3 mL  3 mL Intravenous Q12H Belia Heman,  MD   3 mL at 05/23/12 0913  . [COMPLETED] vancomycin (VANCOCIN) 1,250 mg in sodium chloride 0.9 % 250 mL IVPB  1,250 mg Intravenous Once Herby Abraham, PHARMD   1,250 mg at 05/23/12 0139  . vancomycin (VANCOCIN) 750 mg in sodium chloride 0.9 % 150 mL IVPB  750 mg Intravenous Q T,Th,Sa-HD Herby Abraham, PHARMD      . [DISCONTINUED] iohexol (OMNIPAQUE) 300 MG/ML solution 20 mL  20 mL Oral Once PRN Medication Radiologist, MD      . [DISCONTINUED] nitroGLYCERIN (NITROSTAT) SL tablet 0.4 mg  0.4 mg Sublingual Q5 Min x 3 PRN Nicole Pisciotta, PA-C   0.4 mg at 05/22/12 0745  . [DISCONTINUED] sevelamer (RENVELA) tablet 400 mg  400 mg Oral Daily Belia Heman, MD       Labs: Basic Metabolic Panel:  Lab 05/23/12 8119 05/22/12 0709  NA 133* 135  K 3.9 3.3*  CL 94* 96  CO2 27 30  GLUCOSE 122* 114*  BUN 39* 28*  CREATININE 4.42* 3.62*  CALCIUM 11.0* 10.5  ALB -- --  PHOS -- --   Liver Function Tests:  Lab 05/23/12 0715 05/22/12 1246  AST 22 35  ALT 17 19  ALKPHOS 172* 144*  BILITOT 0.3 0.3  PROT 6.8 6.4  ALBUMIN 2.0* 1.9*    Lab 05/22/12 1246  LIPASE 9*  AMYLASE --   CBC:  Lab 05/23/12 0715 05/22/12 0709  WBC 13.1* 20.8*  NEUTROABS 10.7* 18.3*  HGB 10.9* 9.7*  HCT 35.6* 31.3*  MCV 89.7 88.9  PLT 172 169  Cardiac Enzymes:  Lab 05/22/12 1608 05/22/12 0709  CKTOTAL -- --  CKMB -- --  CKMBINDEX -- --  TROPONINI <0.30 <0.30  Studies/Results: Dg Chest 2 View  05/22/2012  *RADIOLOGY REPORT*  Clinical Data: 61 year old female with chest pain.  CHEST - 2 VIEW  Comparison: 05/05/2012  Findings: Cardiomegaly, CABG changes, coronary stent, left-sided pacemaker / AICD and right central venous catheter with tip overlying the right atrium again noted. Pulmonary vascular congestion is present. Blunting of the left posterior costophrenic angle is unchanged. There is no evidence of focal airspace disease, pulmonary edema, suspicious pulmonary nodule/mass, pleural effusion, or  pneumothorax. No acute bony abnormalities are identified.  IMPRESSION: Cardiomegaly with pulmonary vascular congestion.   Original Report Authenticated By: Harmon Pier, M.D.    ROS: As per HPI otherwise negative.  Physical Exam: Filed Vitals:   05/22/12 1842 05/22/12 2035 05/23/12 0000 05/23/12 0556  BP: 126/76 122/73 124/92 122/81  Pulse: 78 95 84 72  Temp: 98 F (36.7 C) 100.5 F (38.1 C) 98.6 F (37 C) 98.4 F (36.9 C)  TempSrc: Oral Oral Oral Oral  Resp: 18 18 18 18   Height: 5\' 4"  (1.626 m)     Weight: 56.155 kg (123 lb 12.8 oz)   56.745 kg (125 lb 1.6 oz)  SpO2: 100% 98% 97% 99%     General: Thin, petite woman, in no acute distress. Head: Normocephalic, atraumatic, sclera non-icteric, mucus membranes are moist; wearing glasses Neck: Supple. JVD not elevated. Left sided pacer/AICD Lungs: Crackles right base  Breathing is unlabored. Heart: RRR with S1 S2. 2/6 murmur; sternal scar from prior CABG Abdomen: Soft, non-tender, non-distended with normoactive bowel sounds. No rebound/guarding. Midline scar. Lower extremities:without edema or ischemic changes, no open wounds  Neuro: Alert and oriented X 3. Moves all extremities spontaneously. Psych:  Responds to questions appropriately; quite talkative Dialysis Access:right I-J active No drainage from exit site, though it appears food/debris is on top of dressing; maturing left lower arm AVGG swelling improving-less edema in fingers, but still in full arm.  Dialysis Orders: [name]- transferred there 05/03/2012;  EDW 53.5 HD Bath 3K 2.5 Ca Optiflux 180 Time 4 Heparin 1600. Access left forearm AVGG placed 10/25 Dr. [name] - not used yet; right I-J active BFR 400 DFR 800 Zemplar none IV/HD Epogen 15,000 Units IV/HD No IV Fe Transfused 2 units during November admission  Assessment/Plan: 1.  Fever - WBC 20.8 on admission, now down to 13.1; urine and blood cultures pending; on Vanc and zosyn; high risk for catheter infection 2.   LUE edema - s/p left AVGG placement. 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  The left brachiocephalic vein and SVC are widely patent.  It did not appear to be amenable to endovascular intervention due to the presence of left sided pacer wires through the occluded segment." 3.  Chest pain with hx CAD/ischem CM EF 10% - medical tx per cards; troponins neg x 2; hx NSTEMI in November; CP resolved after 2 NTG 4.  ESRD -  TTS - HD today due to volume status; given EF of 10%, may do better with 4 x per week HD 5.  Hypertension/volume  - pulmonary vasc congestion; about 3 kg above EDW.   6.  Anemia  - Hgb improved from last admission 7.  Metabolic bone disease -  Hypercalcemic with corr Ca of 12.6 - no zemplar; cannot put on  2 Ca bath due to K level; use 2.25 Ca with 4 K bath here; this was an  issue last admission as well; waiting on info to be sent from her HD [name].IPTH was 178 with November labs; she is not on zemplar. Need to address this at discharge. 8.  Malnutrition - on renal diet and nepro suppl 9. Type 2 DM - diet controlled 10. Chronic abdominal pain/chornic pancreatitis related to prior gall stone pancreatitis - on methadone 11. Hyperlipidemia - on statin 12. Hx esophogeal varices and cirrhosis - stable (June 2013 ) 13. Hx thrombocytopenia - platelets much improved from last admission (100 - 130K) 14.  DNR  Sheffield Slider, PA-C [name] Beeper [phone] 05/23/2012, 10:20 AM

## 2012-05-23 NOTE — Progress Notes (Signed)
Pt refused to have hemodialysis today.  Stated "today is not my dialysis day.  Called [facility] nurse in HD to speak with pt.  After speaking with pt. [facility] instructed that she had called Doran Durand, PA and agreed to have HD tomorrow instead.  Amanda Pea, Charity fundraiser.

## 2012-05-23 NOTE — Progress Notes (Addendum)
Subjective: Patient still complaining of 8/10 stabbing lower ab pain related to "joe" which she calls her pancreas.   She is having bowel movements.  She will do CT ab/pelvis only if she can have a cushion to sit on and without contrast.  She is requesting Lemon ice and nepro.  She previously was in [facility] for months.  She denies chest pain this am.      Objective: Vital signs in last 24 hours: Filed Vitals:   05/22/12 1842 05/22/12 2035 05/23/12 0000 05/23/12 0556  BP: 126/76 122/73 124/92 122/81  Pulse: 78 95 84 72  Temp: 98 F (36.7 C) 100.5 F (38.1 C) 98.6 F (37 C) 98.4 F (36.9 C)  TempSrc: Oral Oral Oral Oral  Resp: 18 18 18 18   Height: 5\' 4"  (1.626 m)     Weight: 123 lb 12.8 oz (56.155 kg)   125 lb 1.6 oz (56.745 kg)  SpO2: 100% 98% 97% 99%   Weight change:   Intake/Output Summary (Last 24 hours) at 05/23/12 0905 Last data filed at 05/22/12 2000  Gross per 24 hour  Intake    220 ml  Output      0 ml  Net    220 ml   General: alert and oriented x 3, nad, pleasant, very talkative HEENT: [place]/at CV: possible systolic murmur, no rubs or gallops, ICD intact, surgical scars to chest; right chest with HD cath Lungs: ctab Ab: mod ttp epigastric, LUQ, LLQ, normal bs, nd, soft Extremities: warm, no cyanosis or edema Neuro: CN2-12 grossly intact; moving all 4 extremities   Lab Results: Basic Metabolic Panel:  Lab 05/23/12 1478 05/22/12 0709  NA 133* 135  K 3.9 3.3*  CL 94* 96  CO2 27 30  GLUCOSE 122* 114*  BUN 39* 28*  CREATININE 4.42* 3.62*  CALCIUM 11.0* 10.5  MG -- --  PHOS -- --   Liver Function Tests:  Lab 05/23/12 0715 05/22/12 1246  AST 22 35  ALT 17 19  ALKPHOS 172* 144*  BILITOT 0.3 0.3  PROT 6.8 6.4  ALBUMIN 2.0* 1.9*    Lab 05/22/12 1246  LIPASE 9*  AMYLASE --   CBC:  Lab 05/23/12 0715 05/22/12 0709  WBC 13.1* 20.8*  NEUTROABS 10.7* 18.3*  HGB 10.9* 9.7*  HCT 35.6* 31.3*  MCV 89.7 88.9  PLT 172 169   Cardiac  Enzymes:  Lab 05/22/12 1608 05/22/12 0709  CKTOTAL -- --  CKMB -- --  CKMBINDEX -- --  TROPONINI <0.30 <0.30   Urinalysis:  Lab 05/22/12 2126  COLORURINE YELLOW  LABSPEC 1.017  PHURINE 8.5*  GLUCOSEU NEGATIVE  HGBUR NEGATIVE  BILIRUBINUR NEGATIVE  KETONESUR NEGATIVE  PROTEINUR 100*  UROBILINOGEN 0.2  NITRITE NEGATIVE  LEUKOCYTESUR SMALL*   Misc. Labs: pending blood cultures  Micro Results: No results found for this or any previous visit (from the past 240 hour(s)). Studies/Results: Dg Chest 2 View  05/22/2012  *RADIOLOGY REPORT*  Clinical Data: 61 year old female with chest pain.  CHEST - 2 VIEW  Comparison: 05/05/2012  Findings: Cardiomegaly, CABG changes, coronary stent, left-sided pacemaker / AICD and right central venous catheter with tip overlying the right atrium again noted. Pulmonary vascular congestion is present. Blunting of the left posterior costophrenic angle is unchanged. There is no evidence of focal airspace disease, pulmonary edema, suspicious pulmonary nodule/mass, pleural effusion, or pneumothorax. No acute bony abnormalities are identified.  IMPRESSION: Cardiomegaly with pulmonary vascular congestion.   Original Report Authenticated By: Harmon Pier, M.D.  Medications:  Scheduled Meds:   . aspirin EC  81 mg Oral Daily  . carvedilol  6.25 mg Oral BID WC  . enoxaparin (LOVENOX) injection  30 mg Subcutaneous Q24H  . hydrALAZINE  25 mg Oral Q8H  . [COMPLETED]  HYDROmorphone (DILAUDID) injection  0.5 mg Intravenous Once  . [COMPLETED]  HYDROmorphone (DILAUDID) injection  1 mg Intravenous Once  . [COMPLETED]  HYDROmorphone (DILAUDID) injection  1 mg Intravenous Once  . HYDROmorphone  2 mg Oral TID  . isosorbide mononitrate  30 mg Oral Daily  . methadone  5 mg Oral TID  . pantoprazole  40 mg Oral Daily  . piperacillin-tazobactam (ZOSYN)  IV  2.25 g Intravenous Q8H  . ranolazine  500 mg Oral Daily  . sevelamer  400 mg Oral Daily  . simvastatin  10 mg Oral  q1800  . sodium chloride  3 mL Intravenous Q12H  . [COMPLETED] vancomycin  1,250 mg Intravenous Once  . vancomycin  750 mg Intravenous Q T,Th,Sa-HD   Continuous Infusions:  PRN Meds:.nitroGLYCERIN, [DISCONTINUED] iohexol, [DISCONTINUED] nitroGLYCERIN  Assessment/Plan: 61 y.o female extensive PMH and significant cardiac history presents for chest pain, abdominal pain was found to have leukocytosis.   1. Fever -T max 100.5 overnight. With history of MRSA, Enterobacter and Candida parapsilosis in 09/2011 repeated blood cultures.  She does have catheter in place which may be a source.  Urine less likely -Started Vanc/Zosyn 05/22/12 -Consulted ID for recs -Will get records from [facility] this may be where she was previously treated for bacteremia/fungemia but d/c summaries in 09/2011 state she was followed by ID and on Micafungin, Vancomycin, Meropenem, Rifampin though do not see duration. Patient states she was on some kind of antibiotics x 3 months -TEE not on record.  Possibly new murmur but patient is DNR and EF 10% so probably not a good surgical candidate -will discuss further management if new bacteremia with ID.  -Informed renal as she has a right chest catheter.    2. Abdominal pain  Differentials include pain secondary to chronic pancreatitis though lipase 9, pseudocysts or gastritis. She has a history of ascites and gets a paracentesis q 2weeks at [facility] (per patient). She has elevated liver enzymes this admission Alkaline phosphatase 144 trended up to 172 though she has had elevations in the past but not this high  Plan  -pending ggt   -Continue Dilaudid 2mg  tid, Methadone 5 mg tid (unsure who prescribed Methadone prior to admission though she was once getting from palliative care) -Consider CT ab/pelvis for further w/u but she will only do if she does not have contrast and if she has a cushion.    3. Chest pain, resolved  Could have been be cardiac (typical angina (with  symptoms of pressure, radiation to arm, relief with NTG and nausea) or MI) versus noncardiac (musculoskeletal or related to her right chest HD cath if it is infected). Her troponins were negative x 3. Chest pain was resolved after 2 Nitroglycerin administrations. She does not have any acute ST changes. TIMI score is 3 meaning her risk score is 13% meaning her risk in 14 days of having and CV related event or death is 13%. She has had CABG in 2008 and restenosis of graft 6 months after the procedure. Previous cath LAD with 70% diffuse proximal narrowing prior to patent stent; intermediate vessels 50% proximal stenosis follow by 60% stenosis at bifurcations. Circumflex 80% proximal AV grove stenosis and 80% stenosis in marginal vessel;  RCA diffuse 80% prox stenosis as well as ostial 60 and 90% stenosis in anterior RV marginal branches. EKG: rate 75, LAD, normal intervals, no LVH, no change in ST elevations in III, V1, V2 V3, depression in I and AVL  (compared to 04/2012)  Noncardiac etiology could be secondary to possible cervical radiculopathy or rotator cuff tendonitis or tear in which neck/shoulder imaging may be warranted in the future.   Plan  -She will continued on Imdur and Ranexa, Coreg home doses, continue NTG prn, Continue ASA  -consider imaging of neck/shoulder in the future  -She is prob not a surgical candidate and will continue medical management   4. LUE edema  -s/p graft placement at [facility]. Left upper extremity US done on 05/10/12 without evidence of thrombosis with patent graft.  -No change in left upper extremity edema though extremity still edematous.    5. Leukocytosis  -Currently no source of infection she did spike Tmax 100.5 overnight. -pending blood cultures and started Vanc/Zosyn 12/8  6. Hypertension  -will monitor BP -Continue Imdur, Hydralazine, and Coreg   7. ESRD on HD TThSat  -consulted [facility]   8. DVT Px  -Lovenox   9. F/E/N  -will  monitor labs -renal diet   Dispo: Disposition is deferred at this time, awaiting improvement of current medical problems.  Anticipated discharge in approximately 2-3 day(s).   The patient does have a current PCP (ARMOUR, ROSS B, MD), therefore will be requiring OPC follow-up after discharge.   The patient does not have transportation limitations that hinder transportation to clinic appointments.  .Services Needed at time of discharge: Y = Yes, Blank = No PT:   OT:   RN:   Equipment:   Other:     LOS: 1 day   [name] [phone] 05/23/2012, 9:05 AM

## 2012-05-23 NOTE — Consult Note (Signed)
[facility]    Date of Admission:  05/22/2012  Date of Consult:  05/23/2012  Reason for Consult: fever abdominal pain Referring Physician: Dr. Eben Burow   HPI: [name] is an 61 y.o. female. With end-stage renal disease cirrhosis of the liver ischemic cardiomyopathy prior pancreatectomy who previously had been on peritoneal dialysis until April 2013. During that admission to the [facility], and that the patient she was found to be bacteremic with methicillin-resistant Staphylococcus aureus K PC producing Enterobacter as well as fungemic with Candida parapsilosis. Imaging done during that admission and revealed a perforated viscus which apparently resolved with subsequent imaging. She was managed as an inpatient at that time with vancomycin, a carbapenem that was changed to ciprofloxacin or as well as micafungin. She was discharged to [facility] ultimately was readmitted to [facility] where she had apparent peritonitis with VRE faecium and candida parapsilosis. I do not have records of what she was treated with while there. She states that she was is an apparent chronic ciprofloxacin prophylactic therapy and that she has been having serial paracenteses although she has not had a paracentesis since early November. She presents to the hospital acutely with chest pain with radiation into her right arm as well as with worsening abdominal pain.In ED found to be febrile. Blood cultures were taken  And she was placed on vancomycin and zosyn.  She had refused CT abdomen/pelvis due to pain precipitated by bed for the CT scanner on her coccyx. She has had Severe swelling also of her left arm and this had been noted by IR. There was mentione of fistulogram by radiologist at [facility] being scheduled    I spent greater than 45 minutes with the patient including greater than 50% of time in face to face counsel of the patient and in coordination of  their care.   Past Medical History  Diagnosis Date  . Gallstone pancreatitis     chronic pancreatitis s/p resection of gallbladder and panreas  . Coronary artery disease     s/p CABG 2008 with multiple PCIs  . Ischemic cardiomyopathy     Cath 04/2012, significant calcifications  . End stage renal disease     T,Th,Sat HD by [facility], h/o peritoneal dialysis prior; uses right chest cath for HD, left arm graft placed by [facility] but not ready for use  . Diabetes mellitus     Diet controlled  . Hypertension   . History of nonadherence to medical treatment   . Hyperlipidemia   . Systolic CHF     01/6577 echo EF 10%  . Anemia   . Pneumonia 02/2012  . Esophageal varices   . Bacteremia     enterobacter 09/2011, MRSA 09/2011  . Fatty liver   . GIB (gastrointestinal bleeding)     per pt history   . 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 Perforated bowel     09/2011-no surgical intervention  . NSTEMI (non-ST elevated myocardial infarction)     04/2012; Trop peaked to 1.39    Past Surgical History  Procedure Date  . Coronary artery bypass graft 2008  . Abdominal hysterectomy   . Tubal ligation   . Tonsillectomy   . Insert / replace / remove pacemaker     pacemaker ICD  . Esophagogastroduodenoscopy 09/15/2011    Procedure: ESOPHAGOGASTRODUODENOSCOPY (EGD);  Surgeon: Theda Belfast, MD;  Location: [facility] ENDOSCOPY;  Service: Endoscopy;  Laterality:  N/A;  . Cholecystectomy 2003  ergies:   Allergies  Allergen Reactions  . Morphine And Related     SOB when given IV once  . Ace Inhibitors Other (See Comments)    Unknown reaction but her physician stated that she can't take it (specifically lisinopril)  . Codeine Itching  . Tylenol (Acetaminophen) Other (See Comments)    Patient states that the doctor told her she has a Fatty Liver.     Medications: I have reviewed patients current medications as documented in Epic Anti-infectives     Start     Dose/Rate Route  Frequency Ordered Stop   05/24/12 1200   vancomycin (VANCOCIN) 750 mg in sodium chloride 0.9 % 150 mL IVPB  Status:  Discontinued        750 mg 150 mL/hr over 60 Minutes Intravenous Every T-Th-Sa (Hemodialysis) 05/22/12 2215 05/23/12 1348   05/24/12 1200   vancomycin (VANCOCIN) 500 mg in sodium chloride 0.9 % 100 mL IVPB        500 mg 100 mL/hr over 60 Minutes Intravenous Every T-Th-Sa (Hemodialysis) 05/23/12 1348     05/23/12 0000   vancomycin (VANCOCIN) 1,250 mg in sodium chloride 0.9 % 250 mL IVPB        1,250 mg 166.7 mL/hr over 90 Minutes Intravenous  Once 05/22/12 2213 05/23/12 0309   05/23/12 0000  piperacillin-tazobactam (ZOSYN) IVPB 2.25 g       2.25 g 100 mL/hr over 30 Minutes Intravenous 3 times per day 05/22/12 2214            Social History:  reports that she quit smoking about 11 years ago. She does not have any smokeless tobacco history on file. She reports that she does not drink alcohol or use illicit drugs.  Family History  Problem Relation Age of Onset  . Coronary artery disease Mother   . Hypertension Mother   . Diabetes Mother   . Diabetes Sister   . Anesthesia problems Neg Hx     As in HPI and primary teams notes otherwise 12 point review of systems is negative  Blood pressure 112/74, pulse 84, temperature 97.8 F (36.6 C), temperature source Oral, resp. rate 18, height 5\' 4"  (1.626 m), weight 125 lb 1.6 oz (56.745 kg), SpO2 98.00%. General: Alert and awake, oriented x3, not in any acute distress. HEENT: anicteric sclera, pupils reactive to light and accommodation, EOMI, oropharynx clear and without exudate CVS regular rate, normal r,  no murmur rubs or gallops Chest: clear to auscultation bilaterally, no wheezing, rales or rhonchi Abdomen: Tender to palpation throughout with distention. Difficult to percuss her abdomen due to her exquisite tenderness to palpation. Not clear that she has rebound tenderness, Extremities left upper arm is grossly  edematous there is a thrill audible over her graft site. No obvious purulence arm is tender. Skin: HD catheter site is without purulence  Neuro: nonfocal, strength and sensation intact   Results for orders placed during the hospital encounter of 05/22/12 (from the past 48 hour(s))  CBC WITH DIFFERENTIAL     Status: Abnormal   Collection Time   05/22/12  7:09 AM      Component Value Range Comment   WBC 20.8 (*) 4.0 - 10.5 K/uL    RBC 3.52 (*) 3.87 - 5.11 MIL/uL    Hemoglobin 9.7 (*) 12.0 - 15.0 g/dL    HCT 29.5 (*) 62.1 - 46.0 %    MCV 88.9  78.0 - 100.0 fL  MCH 27.6  26.0 - 34.0 pg    MCHC 31.0  30.0 - 36.0 g/dL    RDW 16.1 (*) 09.6 - 15.5 %    Platelets 169  150 - 400 K/uL    Neutrophils Relative 88 (*) 43 - 77 %    Neutro Abs 18.3 (*) 1.7 - 7.7 K/uL    Lymphocytes Relative 5 (*) 12 - 46 %    Lymphs Abs 1.1  0.7 - 4.0 K/uL    Monocytes Relative 6  3 - 12 %    Monocytes Absolute 1.3 (*) 0.1 - 1.0 K/uL    Eosinophils Relative 0  0 - 5 %    Eosinophils Absolute 0.0  0.0 - 0.7 K/uL    Basophils Relative 0  0 - 1 %    Basophils Absolute 0.0  0.0 - 0.1 K/uL   BASIC METABOLIC PANEL     Status: Abnormal   Collection Time   05/22/12  7:09 AM      Component Value Range Comment   Sodium 135  135 - 145 mEq/L    Potassium 3.3 (*) 3.5 - 5.1 mEq/L    Chloride 96  96 - 112 mEq/L    CO2 30  19 - 32 mEq/L    Glucose, Bld 114 (*) 70 - 99 mg/dL    BUN 28 (*) 6 - 23 mg/dL    Creatinine, Ser 0.45 (*) 0.50 - 1.10 mg/dL    Calcium 40.9  8.4 - 10.5 mg/dL    GFR calc non Af Amer 12 (*) >90 mL/min    GFR calc Af Amer 14 (*) >90 mL/min   TROPONIN I     Status: Normal   Collection Time   05/22/12  7:09 AM      Component Value Range Comment   Troponin I <0.30  <0.30 ng/mL   POCT I-STAT TROPONIN I     Status: Normal   Collection Time   05/22/12 11:42 AM      Component Value Range Comment   Troponin i, poc 0.03  0.00 - 0.08 ng/mL    Comment 3            LIPASE, BLOOD     Status: Abnormal    Collection Time   05/22/12 12:46 PM      Component Value Range Comment   Lipase 9 (*) 11 - 59 U/L   HEPATIC FUNCTION PANEL     Status: Abnormal   Collection Time   05/22/12 12:46 PM      Component Value Range Comment   Total Protein 6.4  6.0 - 8.3 g/dL    Albumin 1.9 (*) 3.5 - 5.2 g/dL    AST 35  0 - 37 U/L    ALT 19  0 - 35 U/L    Alkaline Phosphatase 144 (*) 39 - 117 U/L    Total Bilirubin 0.3  0.3 - 1.2 mg/dL    Bilirubin, Direct <8.1  0.0 - 0.3 mg/dL    Indirect Bilirubin NOT CALCULATED  0.3 - 0.9 mg/dL   TROPONIN I     Status: Normal   Collection Time   05/22/12  4:08 PM      Component Value Range Comment   Troponin I <0.30  <0.30 ng/mL   URINALYSIS, ROUTINE W REFLEX MICROSCOPIC     Status: Abnormal   Collection Time   05/22/12  9:26 PM      Component Value Range Comment   Color, Urine YELLOW  YELLOW    APPearance CLOUDY (*) CLEAR    Specific Gravity, Urine 1.017  1.005 - 1.030    pH 8.5 (*) 5.0 - 8.0    Glucose, UA NEGATIVE  NEGATIVE mg/dL    Hgb urine dipstick NEGATIVE  NEGATIVE    Bilirubin Urine NEGATIVE  NEGATIVE    Ketones, ur NEGATIVE  NEGATIVE mg/dL    Protein, ur 409 (*) NEGATIVE mg/dL    Urobilinogen, UA 0.2  0.0 - 1.0 mg/dL    Nitrite NEGATIVE  NEGATIVE    Leukocytes, UA SMALL (*) NEGATIVE   URINE MICROSCOPIC-ADD ON     Status: Abnormal   Collection Time   05/22/12  9:26 PM      Component Value Range Comment   Squamous Epithelial / LPF MANY (*) RARE    WBC, UA 11-20  <3 WBC/hpf    RBC / HPF 0-2  <3 RBC/hpf    Bacteria, UA MANY (*) RARE    Urine-Other FEW YEAST     COMPREHENSIVE METABOLIC PANEL     Status: Abnormal   Collection Time   05/23/12  7:15 AM      Component Value Range Comment   Sodium 133 (*) 135 - 145 mEq/L    Potassium 3.9  3.5 - 5.1 mEq/L    Chloride 94 (*) 96 - 112 mEq/L    CO2 27  19 - 32 mEq/L    Glucose, Bld 122 (*) 70 - 99 mg/dL    BUN 39 (*) 6 - 23 mg/dL    Creatinine, Ser 8.11 (*) 0.50 - 1.10 mg/dL    Calcium 91.4 (*) 8.4 - 10.5  mg/dL    Total Protein 6.8  6.0 - 8.3 g/dL    Albumin 2.0 (*) 3.5 - 5.2 g/dL    AST 22  0 - 37 U/L    ALT 17  0 - 35 U/L    Alkaline Phosphatase 172 (*) 39 - 117 U/L    Total Bilirubin 0.3  0.3 - 1.2 mg/dL    GFR calc non Af Amer 10 (*) >90 mL/min    GFR calc Af Amer 11 (*) >90 mL/min   CBC WITH DIFFERENTIAL     Status: Abnormal   Collection Time   05/23/12  7:15 AM      Component Value Range Comment   WBC 13.1 (*) 4.0 - 10.5 K/uL    RBC 3.97  3.87 - 5.11 MIL/uL    Hemoglobin 10.9 (*) 12.0 - 15.0 g/dL    HCT 78.2 (*) 95.6 - 46.0 %    MCV 89.7  78.0 - 100.0 fL    MCH 27.5  26.0 - 34.0 pg    MCHC 30.6  30.0 - 36.0 g/dL    RDW 21.3 (*) 08.6 - 15.5 %    Platelets 172  150 - 400 K/uL    Neutrophils Relative 82 (*) 43 - 77 %    Neutro Abs 10.7 (*) 1.7 - 7.7 K/uL    Lymphocytes Relative 11 (*) 12 - 46 %    Lymphs Abs 1.5  0.7 - 4.0 K/uL    Monocytes Relative 6  3 - 12 %    Monocytes Absolute 0.8  0.1 - 1.0 K/uL    Eosinophils Relative 1  0 - 5 %    Eosinophils Absolute 0.1  0.0 - 0.7 K/uL    Basophils Relative 0  0 - 1 %    Basophils Absolute 0.0  0.0 - 0.1 K/uL  GAMMA GT     Status: Normal   Collection Time   05/23/12  7:15 AM      Component Value Range Comment   GGT 40  7 - 51 U/L   GLUCOSE, CAPILLARY     Status: Abnormal   Collection Time   05/23/12 11:40 AM      Component Value Range Comment   Glucose-Capillary 120 (*) 70 - 99 mg/dL    Comment 1 Notify RN         Component Value Date/Time   SDES BLOOD LEFT FOOT 05/10/2012 1048   SPECREQUEST BOTTLES DRAWN AEROBIC AND ANAEROBIC AERO 10CC ANA 8CC 05/10/2012 1048   CULT  Value: STAPHYLOCOCCUS SPECIES (COAGULASE NEGATIVE) Note: THE SIGNIFICANCE OF ISOLATING THIS ORGANISM FROM A SINGLE SET OF BLOOD CULTURES WHEN MULTIPLE SETS ARE DRAWN IS UNCERTAIN. PLEASE NOTIFY THE MICROBIOLOGY DEPARTMENT WITHIN ONE WEEK IF SPECIATION AND SENSITIVITIES ARE REQUIRED. 27 Note: Gram Stain Report Called to,Read Back By and Verified With: DR.HENSEAL  AT 8:20PM 11 13 BY THOMI 05/10/2012 1048   REPTSTATUS 05/13/2012 FINAL 05/10/2012 1048   Dg Chest 2 View  05/22/2012  *RADIOLOGY REPORT*  Clinical Data: 61 year old female with chest pain.  CHEST - 2 VIEW  Comparison: 05/05/2012  Findings: Cardiomegaly, CABG changes, coronary stent, left-sided pacemaker / AICD and right central venous catheter with tip overlying the right atrium again noted. Pulmonary vascular congestion is present. Blunting of the left posterior costophrenic angle is unchanged. There is no evidence of focal airspace disease, pulmonary edema, suspicious pulmonary nodule/mass, pleural effusion, or pneumothorax. No acute bony abnormalities are identified.  IMPRESSION: Cardiomegaly with pulmonary vascular congestion.   Original Report Authenticated By: Harmon Pier, M.D.      Recent Results (from the past 720 hour(s))  CULTURE, BLOOD (ROUTINE X 2)     Status: Normal   Collection Time   05/07/12  1:15 PM      Component Value Range Status Comment   Specimen Description BLOOD HEMODIALYSIS CATHETER   Final    Special Requests BOTTLES DRAWN AEROBIC AND ANAEROBIC 10CC   Final    Culture  Setup Time 05/07/2012 17:57   Final    Culture NO GROWTH 5 DAYS   Final    Report Status 05/13/2012 FINAL   Final   CULTURE, BLOOD (ROUTINE X 2)     Status: Normal   Collection Time   05/07/12  1:20 PM      Component Value Range Status Comment   Specimen Description BLOOD HEMODIALYSIS CATHETER   Final    Special Requests BOTTLES DRAWN AEROBIC AND ANAEROBIC 10CC   Final    Culture  Setup Time 05/07/2012 17:57   Final    Culture NO GROWTH 5 DAYS   Final    Report Status 05/13/2012 FINAL   Final   URINE CULTURE     Status: Normal   Collection Time   05/08/12 12:36 AM      Component Value Range Status Comment   Specimen Description URINE, CLEAN CATCH   Final    Special Requests NONE   Final    Culture  Setup Time 05/08/2012 13:44   Final    Colony Count >=100,000 COLONIES/ML   Final    Culture      Final    Value: Multiple bacterial morphotypes present, none predominant. Suggest appropriate recollection if clinically indicated.   Report Status 05/09/2012 FINAL   Final   CULTURE, BLOOD (ROUTINE X 2)     Status: Normal  Collection Time   05/10/12 10:42 AM      Component Value Range Status Comment   Specimen Description BLOOD RIGHT FOOT   Final    Special Requests BOTTLES DRAWN AEROBIC AND ANAEROBIC 10CC   Final    Culture  Setup Time 05/10/2012 18:08   Final    Culture NO GROWTH 5 DAYS   Final    Report Status 05/16/2012 FINAL   Final   CULTURE, BLOOD (ROUTINE X 2)     Status: Normal   Collection Time   05/10/12 10:48 AM      Component Value Range Status Comment   Specimen Description BLOOD LEFT FOOT   Final    Special Requests     Final    Value: BOTTLES DRAWN AEROBIC AND ANAEROBIC AERO 10CC ANA 8CC   Culture  Setup Time 05/10/2012 18:08   Final    Culture     Final    Value: STAPHYLOCOCCUS SPECIES (COAGULASE NEGATIVE)     Note: THE SIGNIFICANCE OF ISOLATING THIS ORGANISM FROM A SINGLE SET OF BLOOD CULTURES WHEN MULTIPLE SETS ARE DRAWN IS UNCERTAIN. PLEASE NOTIFY THE MICROBIOLOGY DEPARTMENT WITHIN ONE WEEK IF SPECIATION AND SENSITIVITIES ARE REQUIRED.     27 Note: Gram Stain Report Called to,Read Back By and Verified With: DR.HENSEAL AT 8:20PM 11 13 BY THOMI   Report Status 05/13/2012 FINAL   Final      Impression/Recommendation  61 year old lady with ESRD, cirrhosis, ischemic cardiomyopathy, prior pancreatectomy, prior polymicrobial bacteremia and peritonitis that developed while she was on peritoneal dialysis and when she had developed a perforated viscus now admitted with chest pain, abdominal pain and fevers  #1 Fevers: I worry of course about peritonitis and pathology in her GI tract. She has grown organisms such as VRE and C parapsilosis from peritoneal fluid to which her current antibiotics are NOT active  --I recommend CT of the abdomen and pelvis with IV and oral  contrast, with some special measures taken to make the exam less uncomfortable the patient--example use of a small pillow under her coccyx while the scan is being performed --The interim an ultrasound the abdomen could also be performed in one could consider ultrasound-guided paracentesis for bacterial and fungal cultures. --Certainly should she deteriorate one could consider broadening her therapy with Zyvox or tygacycline from vancomycin and also addintin an antifungal such as mycafungin  --right now my preference while she is stable would be to hold off on this and work the patient up --I would also talk to [facility] re her left arm that is quite tender adn edematous --fu blood cultures  #2 Infection Prevention:  This patient should In My opinion be on contact precautions. She has grown a MDR  CarbaPENEMASE producing organism from blood in April, and VRE in May and she has evidence of active infection at present.   Thank you so much for this interesting consult  [facility] [phone] (pager) [phone] (office) 05/23/2012, 3:14 PM  [name] 05/23/2012, 3:14 PM

## 2012-05-24 ENCOUNTER — Encounter (HOSPITAL_COMMUNITY): Payer: Self-pay | Admitting: Internal Medicine

## 2012-05-24 ENCOUNTER — Inpatient Hospital Stay (HOSPITAL_COMMUNITY): Payer: Medicare Other

## 2012-05-24 DIAGNOSIS — M7989 Other specified soft tissue disorders: Secondary | ICD-10-CM

## 2012-05-24 LAB — CBC
Hemoglobin: 10 g/dL — ABNORMAL LOW (ref 12.0–15.0)
RBC: 3.66 MIL/uL — ABNORMAL LOW (ref 3.87–5.11)
WBC: 10.7 10*3/uL — ABNORMAL HIGH (ref 4.0–10.5)

## 2012-05-24 LAB — GLUCOSE, CAPILLARY
Glucose-Capillary: 123 mg/dL — ABNORMAL HIGH (ref 70–99)
Glucose-Capillary: 91 mg/dL (ref 70–99)
Glucose-Capillary: 176 mg/dL — ABNORMAL HIGH (ref 70–99)
Glucose-Capillary: 132 mg/dL — ABNORMAL HIGH (ref 70–99)

## 2012-05-24 LAB — CBC WITH DIFFERENTIAL/PLATELET
Basophils Absolute: 0 10*3/uL (ref 0.0–0.1)
HCT: 29.2 % — ABNORMAL LOW (ref 36.0–46.0)
Lymphocytes Relative: 9 % — ABNORMAL LOW (ref 12–46)
Neutro Abs: 8.7 10*3/uL — ABNORMAL HIGH (ref 1.7–7.7)
Neutrophils Relative %: 83 % — ABNORMAL HIGH (ref 43–77)
Platelets: 167 10*3/uL (ref 150–400)
RDW: 17.2 % — ABNORMAL HIGH (ref 11.5–15.5)
WBC: 10.6 10*3/uL — ABNORMAL HIGH (ref 4.0–10.5)

## 2012-05-24 LAB — RENAL FUNCTION PANEL
Albumin: 1.8 g/dL — ABNORMAL LOW (ref 3.5–5.2)
GFR calc Af Amer: 10 mL/min — ABNORMAL LOW (ref >90)
Glucose, Bld: 155 mg/dL — ABNORMAL HIGH (ref 70–99)
Phosphorus: 3.8 mg/dL (ref 2.3–4.6)
Potassium: 4.4 mEq/L (ref 3.5–5.1)
Sodium: 129 mEq/L — ABNORMAL LOW (ref 135–145)

## 2012-05-24 MED ORDER — HYDROMORPHONE HCL 2 MG PO TABS
1.0000 mg | ORAL_TABLET | ORAL | Status: DC | PRN
  Administered 2012-05-24 – 2012-05-25 (×3): 1 mg via ORAL
  Filled 2012-05-24 (×2): qty 1

## 2012-05-24 MED ORDER — SODIUM CHLORIDE 0.9 % IV SOLN: 100.0000 mL | INTRAVENOUS | Status: DC | PRN

## 2012-05-24 MED ORDER — LIDOCAINE-PRILOCAINE 2.5-2.5 % EX CREA: 1.0000 "application " | TOPICAL_CREAM | CUTANEOUS | Status: DC | PRN

## 2012-05-24 MED ORDER — PENTAFLUOROPROP-TETRAFLUOROETH EX AERO: 1.0000 "application " | INHALATION_SPRAY | CUTANEOUS | Status: DC | PRN

## 2012-05-24 MED ORDER — IOHEXOL 300 MG/ML  SOLN
100.0000 mL | Freq: Once | INTRAMUSCULAR | Status: AC | PRN
  Administered 2012-05-24: 100 mL via INTRAVENOUS

## 2012-05-24 MED ORDER — IOHEXOL 300 MG/ML  SOLN
20.0000 mL | INTRAMUSCULAR | Status: AC
  Administered 2012-05-24 (×2): 20 mL via ORAL

## 2012-05-24 MED ORDER — HEPARIN SODIUM (PORCINE) 1000 UNIT/ML DIALYSIS: 1000.0000 [IU] | INTRAMUSCULAR | Status: DC | PRN

## 2012-05-24 MED ORDER — ALTEPLASE 2 MG IJ SOLR: 2.0000 mg | Freq: Once | INTRAMUSCULAR | Status: DC | PRN

## 2012-05-24 MED ORDER — LIDOCAINE HCL (PF) 1 % IJ SOLN: 5.0000 mL | INTRAMUSCULAR | Status: DC | PRN

## 2012-05-24 MED ORDER — HEPARIN SODIUM (PORCINE) 1000 UNIT/ML DIALYSIS: 20.0000 [IU]/kg | INTRAMUSCULAR | Status: DC | PRN

## 2012-05-24 MED ORDER — HYDROMORPHONE HCL 2 MG PO TABS
ORAL_TABLET | ORAL | Status: AC
  Administered 2012-05-24: 2 mg via ORAL
  Filled 2012-05-24: qty 1

## 2012-05-24 NOTE — Progress Notes (Signed)
Internal Medicine Teaching Service Attending Note Date: 05/24/2012  Patient name: [name]  Medical record number: [id number]  Date of birth: [birth year]    This patient has been seen and discussed with the house staff. Please see their note for complete details. I concur with their findings with the following additions/corrections: [name] continues to have abdominal pain. She did not have fever or chills. The plan is to do a CT abdomen and pelvis today. We will involve palliative care medicine to guide [place] with management in this patient who is already on palliative care and currently refuses abdominal surgery if we find anything. Patient has had the HD catheter for last 7 months which could be a source of her infection. We would request renal to consider taking out the old catheter.  Lars Mage 05/24/2012, 11:27 AM

## 2012-05-24 NOTE — Progress Notes (Signed)
PT Cancellation Note  Patient Details Name: DELITHA ELMS MRN: [id number] DOB: 10-31-1950   Cancelled Treatment:    Reason Eval/Treat Not Completed: Patient at procedure or test/unavailable  Currently at CT, and likely for HD after that;  Will follow u pfor PT eval tomorrow   Van Clines [facility] 05/24/2012, 1:53 PM

## 2012-05-24 NOTE — Progress Notes (Signed)
I visited patient in hemodialysis at 6:30pm to discuss CT abd/pelvis findings.  Patient is not interested in pursuing the option of operative management at all.  She believes that she would not survive another major procedure.  She is however willing to undergo US guided abdominal paracentesis in order to acquire fluid studies to help guide antibiotic therapy.    I will place orders for IR guided paracentesis and fluid studies.

## 2012-05-24 NOTE — Progress Notes (Signed)
OT Cancellation Note  Patient Details Name: [name] MRN: [id number] DOB: Nov 10, 1950   Cancelled Treatment:    Reason Eval/Treat Not Completed: Patient at procedure or test/ unavailable (At CT. will eval tomorrow)  [facility] Novah Goza, OTR/L  [phone] 05/24/2012 05/24/2012, 2:06 PM

## 2012-05-24 NOTE — Progress Notes (Signed)
Room  4714 - [name] - PMT-Palliative Medicine Team - RN Liaison Visit  PMT consult ordered for GOC and pain management options.  Discussed with patient at bedside.  Pt is A&O and states she has no family in-state.  She has a step dtr that may be present for meettng.    Scheduled for Wednesday 12/11  @ 12Noon.   Please call PMT phone @ [phone] with any questions.  Thank you. Chalmers Cater, RN Palliative Medicine Team RN Liaison [phone]

## 2012-05-24 NOTE — Progress Notes (Signed)
[facility]  Days of antibiotics:  Day # 3of vancomycin  Day # 3of zosyn   Subjective:  "I've Had a rough day"   Antibiotics:  Anti-infectives     Start     Dose/Rate Route Frequency Ordered Stop   05/24/12 1200   vancomycin (VANCOCIN) 750 mg in sodium chloride 0.9 % 150 mL IVPB  Status:  Discontinued        750 mg 150 mL/hr over 60 Minutes Intravenous Every T-Th-Sa (Hemodialysis) 05/22/12 2215 05/23/12 1348   05/24/12 1200   vancomycin (VANCOCIN) 500 mg in sodium chloride 0.9 % 100 mL IVPB        500 mg 100 mL/hr over 60 Minutes Intravenous Every T-Th-Sa (Hemodialysis) 05/23/12 1348     05/23/12 0000   vancomycin (VANCOCIN) 1,250 mg in sodium chloride 0.9 % 250 mL IVPB        1,250 mg 166.7 mL/hr over 90 Minutes Intravenous  Once 05/22/12 2213 05/23/12 0309   05/23/12 0000  piperacillin-tazobactam (ZOSYN) IVPB 2.25 g       2.25 g 100 mL/hr over 30 Minutes Intravenous 3 times per day 05/22/12 2214            Medications: Scheduled Meds:   . aspirin EC  81 mg Oral Daily  . carvedilol  6.25 mg Oral BID WC  . enoxaparin (LOVENOX) injection  30 mg Subcutaneous Q24H  . feeding supplement (NEPRO CARB STEADY)  237 mL Oral BID BM  . hydrALAZINE  25 mg Oral Q8H  . HYDROmorphone  2 mg Oral TID  . [COMPLETED] iohexol  20 mL Oral Q1 Hr x 2  . isosorbide mononitrate  30 mg Oral Daily  . methadone  5 mg Oral TID  . multivitamin  1 tablet Oral Daily  . pantoprazole  40 mg Oral Daily  . piperacillin-tazobactam (ZOSYN)  IV  2.25 g Intravenous Q8H  . ranolazine  500 mg Oral Daily  . sevelamer  400 mg Oral Q breakfast  . simvastatin  10 mg Oral q1800  . sodium chloride  3 mL Intravenous Q12H  . vancomycin  500 mg Intravenous Q T,Th,Sa-HD   Continuous Infusions:  PRN Meds:.sodium chloride, sodium chloride, alteplase, calcium carbonate (dosed in mg elemental calcium), camphor-menthol, docusate sodium, heparin, heparin, HYDROmorphone, hydrOXYzine,  [COMPLETED] iohexol, lidocaine, lidocaine-prilocaine, nitroGLYCERIN, ondansetron (ZOFRAN) IV, ondansetron, pentafluoroprop-tetrafluoroeth, sorbitol, zolpidem   Objective: Weight change: 4 lb 4.6 oz (1.945 kg)  Intake/Output Summary (Last 24 hours) at 05/24/12 1538 Last data filed at 05/24/12 1313  Gross per 24 hour  Intake    393 ml  Output    100 ml  Net    293 ml   Blood pressure 104/63, pulse 68, temperature 97.7 F (36.5 C), temperature source Oral, resp. rate 18, height 5\' 4"  (1.626 m), weight 128 lb 1.6 oz (58.106 kg), SpO2 98.00%. Temp:  [97.7 F (36.5 C)-98.6 F (37 C)] 97.7 F (36.5 C) (12/10 1456) Pulse Rate:  [67-70] 68  (12/10 1500) Resp:  [16-18] 18  (12/10 1500) BP: (99-111)/(60-68) 104/63 mmHg (12/10 1500) SpO2:  [96 %-98 %] 98 % (12/10 1456) Weight:  [128 lb 1.4 oz (58.1 kg)-128 lb 1.6 oz (58.106 kg)] 128 lb 1.6 oz (58.106 kg) (12/10 0531)  Physical Exam: General: Alert and awake, oriented x3, not in any acute distress.  HEENT: anicteric sclera, pupils reactive to light and accommodation, EOMI, oropharynx clear and without exudate  CVS regular rate, normal r, no murmur rubs  or gallops  Chest: clear to auscultation bilaterally, no wheezing, rales or rhonchi  Abdomen: Tender to palpation throughout with distention. Difficult to percuss her abdomen due to her exquisite tenderness to palpation. Not clear that she has rebound tenderness,  Extremities left upper arm is grossly edematous there is a thrill audible over her graft site. No obvious purulence arm is tender.  Skin: HD catheter site is without purulence  Neuro: nonfocal, strength and sensation intact   Lab Results:  Basename 05/24/12 0809 05/24/12 0510  WBC 10.7* 10.6*  HGB 10.0* 9.4*  HCT 32.2* 29.2*  PLT 176 167    BMET  Basename 05/24/12 0809 05/23/12 0715  NA 129* 133*  K 4.4 3.9  CL 90* 94*  CO2 26 27  GLUCOSE 155* 122*  BUN 52* 39*  CREATININE 4.95* 4.42*  CALCIUM 11.0* 11.0*    Micro  Results: Recent Results (from the past 240 hour(s))  URINE CULTURE     Status: Normal   Collection Time   05/22/12  9:26 PM      Component Value Range Status Comment   Specimen Description URINE, RANDOM   Final    Special Requests ADDED 2220   Final    Culture  Setup Time 05/22/2012 22:33   Final    Colony Count NO GROWTH   Final    Culture NO GROWTH   Final    Report Status 05/23/2012 FINAL   Final   SURGICAL PCR SCREEN     Status: Normal   Collection Time   05/23/12  4:20 PM      Component Value Range Status Comment   MRSA, PCR NEGATIVE  NEGATIVE Final    Staphylococcus aureus NEGATIVE  NEGATIVE Final     Studies/Results: Ct Abdomen Pelvis W Contrast  05/24/2012  *RADIOLOGY REPORT*  Clinical Data: Lower abdominal pain.  Evaluate the pancreas (history of pancreatitis).  Fever of unknown origin.  CT ABDOMEN AND PELVIS WITH CONTRAST  Technique:  Multidetector CT imaging of the abdomen and pelvis was performed following the standard protocol during bolus administration of intravenous contrast.  Contrast: OMNIPAQUE IOHEXOL 300 MG/ML  SOLN  Comparison: CT of the abdomen and pelvis 02/23/2012.  Findings:  Lung Bases: Severe cardiomegaly with marked dilatation of the left ventricle.  Atherosclerotic calcifications in the left anterior descending, left circumflex and right coronary arteries.  Pacemaker / AICD leads terminating in the right atrium and right ventricular apex, as well as overlying the lateral wall of the left ventricle (via the coronary sinus).  Small bilateral pleural effusions (right greater than left).  Dependent atelectasis in the lower lobes of the lungs bilaterally.  Abdomen/Pelvis:  Status post cholecystectomy.  There is an unusual pattern of hypoperfusion throughout the central liver which partially normalizes on the delayed phase images.  In segment 3 there is an ill-defined 1.8 cm lesion which is incompletely characterized.  This is similar in size to the prior examination  02/23/2012.  The gallbladder has been removed.  The pancreas is difficult to discretely visualized on either phase of the examination, such that an acute pancreatitis is suspect. Additionally, in the tail of the pancreas extending toward the splenic hilum there is an abnormal ill-defined fluid and gas collection, suspicious for a complex infected pseudocyst.   The adrenal glands are poorly visualized.  Parenchymal thinning is noted the kidneys bilaterally.  Numerous low attenuation lesions are noted in the kidneys bilaterally, likely represents small cysts.  Extensive atherosclerosis throughout the abdominal and pelvic  vasculature, without definite aneurysm or dissection.  Large volume of ascites. The appendix is normal.  Numerous colonic diverticula.  Accurate assessment for diverticulitis is not possible on this examination given the large volume of ascites. Status post hysterectomy.  Ovaries are not confidently identified and may be surgically absent or atrophic.  Low-lying pelvic floor, suggesting pelvic organ prolapse.  Specifically, there is a large amount of ascites between the rectum and posterior wall of the vagina (i.e., a peritoneocele).  Musculoskeletal: There are no aggressive appearing lytic or blastic lesions noted in the visualized portions of the skeleton.  IMPRESSION:  1.  The pancreas is very difficult to visualize on today's examination, suggesting significant pancreatic inflammation and/or necrosis.  This is most profound in the region of the tail of the pancreas where there is an abnormal fluid and gas collection which extends posterolaterally via the gastrosplenic ligament and partially encircles the spleen.   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 2.  Large volume of ascites. 3.  Unusual hypoperfusion in the central liver is of uncertain etiology and significance.  Correlation with liver function tests may be warranted.  4.  Normal appendix. 5.  Numerous colonic diverticula.  Accurate assessment for diverticulitis is not possible on this examination given the large volume of ascites. 6.  Severe cardiomegaly with marked left ventricular dilatation. 7.  Small bilateral pleural effusions. 8.  Atherosclerosis, including at least three vessel coronary artery disease. 9.  Additional incidental findings, as above, similar to prior studies.  These results were called by telephone on 05/24/2012 at 03:23 p.m. to Dr. Eben Burow, who verbally acknowledged these results.   Original Report Authenticated By: Trudie Reed, M.D.       Assessment/Plan: [name] is a 61 y.o. female with  with ESRD, cirrhosis, ischemic cardiomyopathy, prior pancreatectomy, prior polymicrobial bacteremia and peritonitis that developed while she was on peritoneal dialysis and when she had developed a perforated viscus now admitted with chest pain, abdominal pain and fevers. Blood cultures so far no growth. CT showing signficant pancreatic inflammation and possibly necrosis, massive amount of ascites. 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 The left brachiocephalic vein and SVC are widely patent. It did not appear to be amenable to endovascular intervention due to the presence of left sided pacer wires through the occluded segment." and has been evaluated here by Dr. Myra Gianotti for her LUE edema that is consequence of this   #1 Fevers: I remain concerned for  peritonitis  --I would recommend IR guided  paracentesis  For : --cell count and differential --bacterial cultures --fungal cultures --would continue vanco/zosyn for now  --should she deteriorate one could consider broadening her therapy with Zyvox or tygacycline from vancomycin and also addintin an antifungal such as mycafungin   #2 Infection Prevention:  This patient should In My opinion be on  contact precautions. 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    LOS: 2 days    [facility]  [facility]  [phone] (pager)  [phone] (office)   Paulette Blanch Dam 05/24/2012, 3:38 PM

## 2012-05-24 NOTE — Progress Notes (Signed)
Subjective: Pt has 7/10 abdominal pain "joe". She is agreeable to CT scan only if we can find a cushion but she does not want contrast.  She states she does not prefer surgical intervention if +findings on CT. She requests diet change back to cardiac diet.  She states chest pain is resolved.  She is passing gas, she denies nausea/vomiting   Objective: Vital signs in last 24 hours: Filed Vitals:   05/23/12 1424 05/23/12 2047 05/24/12 0500 05/24/12 0531  BP: 112/74 99/61  111/60  Pulse: 84 70  69  Temp: 97.8 F (36.6 C) 98.4 F (36.9 C)  98.6 F (37 C)  TempSrc: Oral Oral  Oral  Resp: 18 16  17   Height:      Weight:   128 lb 1.4 oz (58.1 kg) 128 lb 1.6 oz (58.106 kg)  SpO2: 98% 96%  97%   Weight change: 4 lb 4.6 oz (1.945 kg)  Intake/Output Summary (Last 24 hours) at 05/24/12 1021 Last data filed at 05/24/12 2130  Gross per 24 hour  Intake   1440 ml  Output    101 ml  Net   1339 ml   General: alert and oriented x 3, nad, pleasant HEENT: [place]/at CV: RRR, no rubs or gallops or murmurs, ICD intact, surgical scars to chest; right chest with HD cath no drainage or evidence of infection Lungs: ctab Ab: mod ttp LLQ, RLQ, hypoactive bs, nd, soft Extremities: warm, no cyanosis or edema except left upper extremity with edema that appears increasing. No erythema or drainage s/p graft  Neuro: CN2-12 grossly intact; moving all 4 extremities   Lab Results: Basic Metabolic Panel:  Lab 05/24/12 8657 05/23/12 0715  NA 129* 133*  K 4.4 3.9  CL 90* 94*  CO2 26 27  GLUCOSE 155* 122*  BUN 52* 39*  CREATININE 4.95* 4.42*  CALCIUM 11.0* 11.0*  MG -- --  PHOS 3.8 --   Liver Function Tests:  Lab 05/24/12 0809 05/23/12 0715 05/22/12 1246  AST -- 22 35  ALT -- 17 19  ALKPHOS -- 172* 144*  BILITOT -- 0.3 0.3  PROT -- 6.8 6.4  ALBUMIN 1.8* 2.0* --    Lab 05/22/12 1246  LIPASE 9*  AMYLASE --   CBC:  Lab 05/24/12 0809 05/24/12 0510 05/23/12 0715  WBC 10.7* 10.6* --  NEUTROABS --  8.7* 10.7*  HGB 10.0* 9.4* --  HCT 32.2* 29.2* --  MCV 88.0 88.5 --  PLT 176 167 --   Cardiac Enzymes:  Lab 05/22/12 1608 05/22/12 0709  CKTOTAL -- --  CKMB -- --  CKMBINDEX -- --  TROPONINI <0.30 <0.30   Urinalysis:  Lab 05/22/12 2126  COLORURINE YELLOW  LABSPEC 1.017  PHURINE 8.5*  GLUCOSEU NEGATIVE  HGBUR NEGATIVE  BILIRUBINUR NEGATIVE  KETONESUR NEGATIVE  PROTEINUR 100*  UROBILINOGEN 0.2  NITRITE NEGATIVE  LEUKOCYTESUR SMALL*   Misc. Labs: pending blood cultures  Micro Results: Recent Results (from the past 240 hour(s))  URINE CULTURE     Status: Normal   Collection Time   05/22/12  9:26 PM      Component Value Range Status Comment   Specimen Description URINE, RANDOM   Final    Special Requests ADDED 2220   Final    Culture  Setup Time 05/22/2012 22:33   Final    Colony Count NO GROWTH   Final    Culture NO GROWTH   Final    Report Status 05/23/2012 FINAL   Final  SURGICAL PCR SCREEN     Status: Normal   Collection Time   05/23/12  4:20 PM      Component Value Range Status Comment   MRSA, PCR NEGATIVE  NEGATIVE Final    Staphylococcus aureus NEGATIVE  NEGATIVE Final    Studies/Results: No results found. Medications:  Scheduled Meds:    . aspirin EC  81 mg Oral Daily  . carvedilol  6.25 mg Oral BID WC  . enoxaparin (LOVENOX) injection  30 mg Subcutaneous Q24H  . feeding supplement (NEPRO CARB STEADY)  237 mL Oral BID BM  . hydrALAZINE  25 mg Oral Q8H  . HYDROmorphone  2 mg Oral TID  . isosorbide mononitrate  30 mg Oral Daily  . methadone  5 mg Oral TID  . multivitamin  1 tablet Oral Daily  . pantoprazole  40 mg Oral Daily  . piperacillin-tazobactam (ZOSYN)  IV  2.25 g Intravenous Q8H  . ranolazine  500 mg Oral Daily  . sevelamer  400 mg Oral Q breakfast  . simvastatin  10 mg Oral q1800  . sodium chloride  3 mL Intravenous Q12H  . vancomycin  500 mg Intravenous Q T,Th,Sa-HD  . [DISCONTINUED] vancomycin  750 mg Intravenous Q T,Th,Sa-HD    Continuous Infusions:  PRN Meds:.sodium chloride, sodium chloride, alteplase, calcium carbonate (dosed in mg elemental calcium), camphor-menthol, docusate sodium, heparin, heparin, hydrOXYzine, lidocaine, lidocaine-prilocaine, nitroGLYCERIN, ondansetron (ZOFRAN) IV, ondansetron, pentafluoroprop-tetrafluoroeth, sorbitol, zolpidem  Assessment/Plan: 61 y.o female extensive PMH and significant cardiac history presents for resolved chest pain which was probably typical angina after work up with cardiac enzymes neg x 3.  She also had abdominal pain was found to have leukocytosis and spiked a Tmax of 100.5 this admission.     1. Abdominal pain  Differentials include pain secondary to chronic pancreatitis though lipase 9, pseudocysts or gastritis though the patient does not have epigastric pain. She has a history of ascites and gets a paracentesis q 2weeks at [facility] (per patient). She has elevated liver enzymes this admission Alkaline phosphatase 144 trended up to 172 though she has had elevations in the past but not this high.  GGT 40 (normal).  This elevation is less likely hepatic source and more so related to metabolic bone disease secondary to ESRD.     Plan  -Continue Dilaudid 2mg  tid, Methadone 5 mg tid  -Ordered CT ab/pelvis with contrast for further w/u- will get with a cushion and have patient drink as much contrast as she can.  -Consulted palliative care for goals of care this admission, pain control issues chronically  2. LUE edema  -s/p graft placement at [facility] with stenosis/occlusion left SCV with extensive collateral venous drainage . Left upper extremity US done on 05/10/12 without evidence of thrombosis with patent graft.  The edema in this arm is increasing.  -[facility] consulted, Dr. Myra Gianotti to follow.  They will do a procedure when leukocytosis is resolved possibly.     3. Leukocytosis  -trending down to 10.6 this am.  T max 100.5 this admission concerned for source  of infection with h/o MRSA, VRE, enterobacter and Candida blood.  Catheter may be source, another source may be abdomen  -pending CT abdomen/pelvis due to continuous complaints of abdominal pain   -pending blood cultures to follow. Urine culture no growth.  started Vanc/Zosyn 12/8 (Day 2) will continue as she seems to be improving with antibiotics though no source.  Will decide duration of treatment pending CT ab/pelvis.  -  ID and VVS following -Renal stated do not remove right chest catheter until blood cultures resulted   4. Hypertension  -will monitor BP -Continue Imdur, Hydralazine, and Coreg   5. ESRD on HD TThSat  -consulted [facility], [facility] following for left upper extremity graft site and right chest HD site  6. DVT Px  -Lovenox   7. F/E/N  -will monitor labs -cardiac diet (changed diet to cardiac patient was not eating renal diet and requested cardiac diet for nutrition)   Dispo: Disposition is deferred at this time, awaiting improvement of current medical problems.  Anticipated discharge in approximately 2-3 day(s).   The patient does have a current PCP (ARMOUR, ROSS B, MD), therefore will be requiring OPC follow-up after discharge.   The patient does not have transportation limitations that hinder transportation to clinic appointments.  .Services Needed at time of discharge: Y = Yes, Blank = No PT:   OT:   RN:   Equipment:   Other:     LOS: 2 days   Annett Gula [phone] 05/24/2012, 10:21 AM

## 2012-05-24 NOTE — Consult Note (Signed)
[facility] [place] CONSULT NOTE 05/24/2012 DOB: [id number] MRN : [id number]  CC: Edema left arm - S/P left UE AVGG 7 weeks ago  History of Present Illness: [name] is a 62 y.o. female with ESRD HTN and DM on HD since February 2012. She was admitted with chest pain and has hx CAD/ischem CM EF 10%.  Pt had left arm AVGG at [facility] 7 weeks ago. Pt states the left arm was the same size as the right prior to surgery and she developed severe swelling of the LUE since surgery. 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 The left brachiocephalic vein and SVC are widely patent. It did not appear to be amenable to endovascular intervention due to the presence of left sided pacer wires through the occluded segment." Pt states she has had some discomfort in the left arm after the shuntogram. She can move her fingers and grip well. She has good sensation and motion in the arm.  She was admitted with fever and WBC of 20K which is resolving. We were asked to evaluate for poss infection and edema sec to left SCV stenosis.    Past Medical History  Diagnosis Date  . Gallstone pancreatitis     chronic pancreatitis s/p resection of gallbladder and panreas  . Coronary artery disease     s/p CABG 2008 with multiple PCIs  . Ischemic cardiomyopathy     Cath 04/2012, significant calcifications  . History of nonadherence to medical treatment   . Hyperlipidemia   . Systolic CHF     56/2130 echo EF 10%  . Esophageal varices   . Bacteremia     enterobacter 09/2011, MRSA 09/2011  . Fatty liver   . GIB (gastrointestinal bleeding)     per pt history   . 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 Perforated bowel     09/2011-no surgical intervention  . Pacemaker   . ICD (implantable cardiac defibrillator) in place   . Hypertension      "used to have HTN; now I'm low" (05/23/2012)  . CHF (congestive heart failure)   . Heart murmur     "just found that out this year" (05/23/2012)  . NSTEMI (non-ST elevated myocardial infarction)     04/2012; Trop peaked to 1.39; 05/23/2012 pt denies ever having MI  . Pneumonia 02/2012    "first time ever" (05/23/2012)  . Chronic bronchitis 1970's thru 2002    "went away when I stopped smoking in 2002" (05/23/2012)  . Diabetes mellitus     Diet controlled  . History of blood transfusion     "alot of them" (05/23/2012)  . End stage renal disease     T,Th,Sat HD by [facility] (05/23/2012); h/o peritoneal dialysis prior; uses right chest cath for HD, left arm graft placed by [facility] but not ready for use  . Iron (Fe) deficiency anemia     "severe" (05/23/2012)  . Fatty liver     with cirrhosis and esophageal varices     Past Surgical History  Procedure Date  . Esophagogastroduodenoscopy 09/15/2011    Procedure: ESOPHAGOGASTRODUODENOSCOPY (EGD);  Surgeon: Theda Belfast, MD;  Location: [facility] ENDOSCOPY;  Service: Endoscopy;  Laterality: N/A;  . Cholecystectomy 2003  . Tonsillectomy and adenoidectomy 1961  . Appendectomy 1973?  [name] Vaginal hysterectomy 1973?  [name]  Tubal ligation 1972  . Dilation and curettage of uterus     "bunch from profuse bleeding in the 1970's" (05/23/2012)  . Coronary artery bypass graft 2008    CABG X4  . Coronary angioplasty with stent placement 2008    "1; day after CABG" (05/23/2012)  . Cardiac catheterization     "before 2008 and 3 wk ago" (05/23/2012)  . Insert / replace / remove pacemaker 06/2011    pacemaker ICD  . Cardiac defibrillator placement 06/2011  . Arteriovenous graft placement 03/2012    right antecub  . Insertion of dialysis catheter ~ 10/2011    right chest  . Peritoneal catheter insertion 08/2011  . Peritoneal catheter removal ~ 10/2011     ROS: [x]  Positive  [ ]  Denies    General: [ ]  Weight loss, [ ]  Fever, [ ]   chills Neurologic: [ ]  Dizziness, [ ]  Blackouts, [ ]  Seizure [ ]  Stroke, [ ]  "Mini stroke", [ ]  Slurred speech, [ ]  Temporary blindness; [ ]  weakness in arms or legs, [ ]  Hoarseness Cardiac: [x ] Chest pain/pressure, [ ]  Shortness of breath at rest [ x] Shortness of breath with exertion, [ ]  Atrial fibrillation or irregular heartbeat Vascular: [ ]  Pain in legs with walking, [ ]  Pain in legs at rest, [ ]  Pain in legs at night,  [ ]  Non-healing ulcer, [ ]  Blood clot in vein/DVT,   Pulmonary: [ ]  Home oxygen, [ ]  Productive cough, [ ]  Coughing up blood, [ ]  Asthma,  [ ]  Wheezing Musculoskeletal:  [ ]  Arthritis, [ ]  Low back pain, [ ]  Joint pain Hematologic: [ ]  Easy Bruising, [ ]  Anemia; [ ]  Hepatitis Gastrointestinal: [ ]  Blood in stool, [ ]  Gastroesophageal Reflux/heartburn, [ ]  Trouble swallowing Urinary: [x ] chronic Kidney disease, [ ]  on HD - [ ]  MWF or [x ] TTHS, [ ]  Burning with urination, [ ]  Difficulty urinating Skin: [ ]  Rashes, [ ]  Wounds Psychological: [ ]  Anxiety, [ ]  Depression  Social History History  Substance Use Topics  . Smoking status: Former Smoker -- 0.3 packs/day for 31 years    Types: Cigarettes    Quit date: 06/15/2000  . Smokeless tobacco: Never Used  . Alcohol Use: Yes     Comment: 05/23/2012 "drank from age [age]; never had problem w/it"    Family History Family History  Problem Relation Age of Onset  . Coronary artery disease Mother   . Hypertension Mother   . Diabetes Mother   . Diabetes Sister   . Anesthesia problems Neg Hx     Allergies  Allergen Reactions  . Codeine Itching  . Morphine And Related Shortness Of Breath    SOB when given IV once "to me it was mild; I called out fast for help; never had to intubate" (05/23/2012)  . Ace Inhibitors Other (See Comments)    Unknown reaction but her physician stated that she can't take it (specifically lisinopril)  . Tylenol (Acetaminophen) Other (See Comments)    Patient states that the doctor told  her she has a Fatty Liver.    Current Facility-Administered Medications  Medication Dose Route Frequency Provider Last Rate Last Dose  . 0.9 %  sodium chloride infusion  100 mL Intravenous PRN Sheffield Slider, PA      . 0.9 %  sodium chloride infusion  100 mL Intravenous PRN Sheffield Slider, PA      . alteplase (CATHFLO ACTIVASE) injection 2 mg  2  mg Intracatheter Once PRN Sheffield Slider, PA      . aspirin EC tablet 81 mg  81 mg Oral Daily Belia Heman, MD   81 mg at 05/23/12 0912  . calcium carbonate (dosed in mg elemental calcium) suspension 500 mg of elemental calcium  500 mg of elemental calcium Oral Q6H PRN Sheffield Slider, PA      . camphor-menthol [facility]) lotion 1 application  1 application Topical Q8H PRN Sheffield Slider, PA       And  . hydrOXYzine (ATARAX/VISTARIL) tablet 25 mg  25 mg Oral Q8H PRN Sheffield Slider, PA      . carvedilol (COREG) tablet 6.25 mg  6.25 mg Oral BID WC Neema Davina Poke, MD   6.25 mg at 05/23/12 1604  . docusate sodium (ENEMEEZ) enema 283 mg  1 enema Rectal PRN Sheffield Slider, PA      . enoxaparin (LOVENOX) injection 30 mg  30 mg Subcutaneous Q24H Neema Davina Poke, MD   30 mg at 05/23/12 1800  . feeding supplement (NEPRO CARB STEADY) liquid 237 mL  237 mL Oral BID BM Annett Gula, MD   237 mL at 05/23/12 1340  . heparin injection 1,000 Units  1,000 Units Dialysis PRN Sheffield Slider, PA      . heparin injection 1,100 Units  20 Units/kg Dialysis PRN Sheffield Slider, PA      . hydrALAZINE (APRESOLINE) tablet 25 mg  25 mg Oral Q8H Belia Heman, MD   25 mg at 05/23/12 2112  . HYDROmorphone (DILAUDID) tablet 2 mg  2 mg Oral TID Belia Heman, MD   2 mg at 05/23/12 2113  . isosorbide mononitrate (IMDUR) 24 hr tablet 30 mg  30 mg Oral Daily Belia Heman, MD   30 mg at 05/23/12 0913  . lidocaine (XYLOCAINE) 1 % injection 5 mL  5 mL Intradermal PRN Sheffield Slider, PA      . lidocaine-prilocaine (EMLA) cream 1 application  1 application  Topical PRN Sheffield Slider, PA      . methadone (DOLOPHINE) tablet 5 mg  5 mg Oral TID Belia Heman, MD   5 mg at 05/23/12 2113  . multivitamin (RENA-VIT) tablet 1 tablet  1 tablet Oral Daily Sheffield Slider, PA   1 tablet at 05/23/12 1331  . nitroGLYCERIN (NITROSTAT) SL tablet 0.4 mg  0.4 mg Sublingual Q5 min PRN Belia Heman, MD      . ondansetron (ZOFRAN) tablet 4 mg  4 mg Oral Q6H PRN Sheffield Slider, PA       Or  . ondansetron (ZOFRAN) injection 4 mg  4 mg Intravenous Q6H PRN Sheffield Slider, PA      . pantoprazole (PROTONIX) EC tablet 40 mg  40 mg Oral Daily Belia Heman, MD   40 mg at 05/23/12 0912  . pentafluoroprop-tetrafluoroeth (GEBAUERS) aerosol 1 application  1 application Topical PRN Sheffield Slider, PA      . piperacillin-tazobactam (ZOSYN) IVPB 2.25 g  2.25 g Intravenous Q8H Herby Abraham, PHARMD   2.25 g at 05/24/12 0602  . ranolazine (RANEXA) 12 hr tablet 500 mg  500 mg Oral Daily Belia Heman, MD   500 mg at 05/23/12 0913  . sevelamer (RENAGEL) tablet 400 mg  400 mg Oral Q breakfast Belia Heman, MD   400 mg at 05/23/12 0932  . simvastatin (ZOCOR) tablet 10 mg  10 mg  Oral q1800 Belia Heman, MD   10 mg at 05/23/12 1801  . sodium chloride 0.9 % injection 3 mL  3 mL Intravenous Q12H Belia Heman, MD   3 mL at 05/23/12 2114  . sorbitol 70 % solution 30 mL  30 mL Oral PRN Sheffield Slider, PA      . vancomycin (VANCOCIN) 500 mg in sodium chloride 0.9 % 100 mL IVPB  500 mg Intravenous Q T,Th,Sa-HD Cleon Dew, PHARMD      . zolpidem (AMBIEN) tablet 5 mg  5 mg Oral QHS PRN Sheffield Slider, PA      . [DISCONTINUED] sevelamer (RENVELA) tablet 400 mg  400 mg Oral Daily Neema Davina Poke, MD      . [DISCONTINUED] vancomycin (VANCOCIN) 750 mg in sodium chloride 0.9 % 150 mL IVPB  750 mg Intravenous Q T,Th,Sa-HD Herby Abraham, PHARMD         Imaging: No results found.  Significant Diagnostic Studies: CBC Lab Results  Component Value Date   WBC  10.6* 05/24/2012   HGB 9.4* 05/24/2012   HCT 29.2* 05/24/2012   MCV 88.5 05/24/2012   PLT 167 05/24/2012    BMET    Component Value Date/Time   NA 133* 05/23/2012 0715   K 3.9 05/23/2012 0715   CL 94* 05/23/2012 0715   CO2 27 05/23/2012 0715   GLUCOSE 122* 05/23/2012 0715   BUN 39* 05/23/2012 0715   CREATININE 4.42* 05/23/2012 0715   CALCIUM 11.0* 05/23/2012 0715   GFRNONAA 10* 05/23/2012 0715   GFRAA 11* 05/23/2012 0715    COAG Lab Results  Component Value Date   INR 1.42 05/06/2012   INR 1.26 10/13/2011   INR 1.47 10/10/2011   No results found for this basename: PTT     Physical Examination BP Readings from Last 3 Encounters:  05/24/12 111/60  05/11/12 92/48  05/11/12 92/48   Temp Readings from Last 3 Encounters:  05/24/12 98.6 F (37 C) Oral  05/11/12 100.3 F (37.9 C)   05/11/12 100.3 F (37.9 C)    SpO2 Readings from Last 3 Encounters:  05/24/12 97%  05/11/12 96%  05/11/12 96%   Pulse Readings from Last 3 Encounters:  05/24/12 69  05/11/12 95  05/11/12 95    General:  WDWN in NAD HENT: WNL Eyes: Pupils equal Pulmonary: normal non-labored breathing , without Rales, rhonchi,  wheezing Cardiac: RRR, without  Murmurs, rubs or gallops; Abdomen: soft, NT, no masses Skin: no rashes, ulcers noted Vascular Exam/Pulses:2+ radial pulse palp. On left Severe edema LUE + bruit in AVGG No erythema or tenderness over graft Extremities without ischemic changes, no Gangrene , no cellulitis; no open wounds;  Musculoskeletal: no muscle wasting or atrophy  Neurologic: A&O X 3; Appropriate Affect ;  SENSATION: normal; MOTOR FUNCTION: Pt has good and equal strength in all extremities - 5/5 Speech is fluent/normal  ASSESSMENT/PLAN:[name] is a 61 y.o. 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 Pt has pacemaker on left and IR at [facility] did not do an intervention of the subclavian vein stenosis because of  this. The left arm has no cellulitis or tenderness over graft. Doubt this is infected - but this cannot be ruled out Pt may need ligation of the AVGG to reduce swelling in the left arm. This pt is high risk for any surgical intervention with multiple co-morbidities and recent chest pain Dr. Myra Gianotti to evaluate  Continue antibiotics- pt WBC down to 10K   I have seen and evaluated the above patient. There is no external evidence of infection within her dialysis catheter. This does not preclude the catheter being the source of infection, however at this point with blood cultures did not back, I would not remove the catheter. The patient has significant left arm swelling which is do to subclavian stenosis/occlusion where her defibrillator is located. In order to resolve the swelling, the graft will need to be removed. I would not remove the graft or ligated until all of her infectious issues are resolved. This is not emergent and can be done at a later date. We will continue to follow patient while she is in the hospital. I also do not believe the graft is the source of the infection.  Durene Cal

## 2012-05-24 NOTE — Procedures (Signed)
Patient seen on Hemodialysis. QB 400, UF goal 2.5L Treatment adjusted as needed.  Zetta Bills MD [facility] Office # [phone] Pager # [phone] 3:39 PM

## 2012-05-24 NOTE — Progress Notes (Signed)
Patient ID: Threasa Heads, female   DOB: Oct 21, 1950, 61 y.o.   MRN: [id number]   [facility] Progress Note    Subjective:   Reports ongoing abdominal pain (rated as 7/10 earlier) but still hesitant to get CT abdomen/pelvis without necessary comfort measures to alleviate pressure on lower back   Objective:   BP 111/60  Pulse 69  Temp 98.6 F (37 C) (Oral)  Resp 17  Ht 5\' 4"  (1.626 m)  Wt 58.106 kg (128 lb 1.6 oz)  BMI 21.99 kg/m2  SpO2 97%  Physical Exam: Gen: comfortably resting in bed- doctors by bedside NWG:NFAOZ RRR, normal S1 and S2  Resp:CTA bilaterally, no rales/rhonchi HYQ:MVHQ, diffusely tender without rebound- has some apprehensive guarding ION:GEXBM LE edema  Labs: BMET  Lab 05/23/12 0715 05/22/12 0709  NA 133* 135  K 3.9 3.3*  CL 94* 96  CO2 27 30  GLUCOSE 122* 114*  BUN 39* 28*  CREATININE 4.42* 3.62*  ALB -- --  CALCIUM 11.0* 10.5  PHOS -- --   CBC  Lab 05/24/12 0510 05/23/12 0715 05/22/12 0709  WBC 10.6* 13.1* 20.8*  NEUTROABS 8.7* 10.7* 18.3*  HGB 9.4* 10.9* 9.7*  HCT 29.2* 35.6* 31.3*  MCV 88.5 89.7 88.9  PLT 167 172 169    Medications:      . aspirin EC  81 mg Oral Daily  . carvedilol  6.25 mg Oral BID WC  . enoxaparin (LOVENOX) injection  30 mg Subcutaneous Q24H  . feeding supplement (NEPRO CARB STEADY)  237 mL Oral BID BM  . hydrALAZINE  25 mg Oral Q8H  . HYDROmorphone  2 mg Oral TID  . isosorbide mononitrate  30 mg Oral Daily  . methadone  5 mg Oral TID  . multivitamin  1 tablet Oral Daily  . pantoprazole  40 mg Oral Daily  . piperacillin-tazobactam (ZOSYN)  IV  2.25 g Intravenous Q8H  . ranolazine  500 mg Oral Daily  . sevelamer  400 mg Oral Q breakfast  . simvastatin  10 mg Oral q1800  . sodium chloride  3 mL Intravenous Q12H  . vancomycin  500 mg Intravenous Q T,Th,Sa-HD  . [DISCONTINUED] sevelamer  400 mg Oral Daily  . [DISCONTINUED] vancomycin  750 mg Intravenous Q T,Th,Sa-HD     Assessment/ Plan:    1. Fever/Abdominal Pain - WBC 20.8 on admission, now down to 10.6; urine culture negative. Blood cultures pending; on Vanc and zosyn; high risk for catheter infection and recurrent MDR peritonitis. Anticipate ability to do a CT abdomen/pelvis +/- diagnostic paracentesis to better tailor antibiotic therapy. She is on methadone therapy for pain from chronic pancreatitis/abdominal pain. 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 2. LUE edema - s/p left AVGG placement. 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 The left brachiocephalic vein and SVC are widely patent. It did not appear to be amenable to endovascular intervention due to the presence of left sided pacer wires through the occluded segment." Awaiting further input from vascular surgery. I suspect any interventional procedure will need to be deferred until we get resolution of her leukocytosis source/infection control. Difficult management situation with pacer wires in place. 3. 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 ESRD - TTS - HD today, she refused hemodialysis yesterday which was planned to try  and help alleviate some of her volume overload; given EF of 10% she may benefit from discussions regarding dialysis 4 days a week (one day being sequential ultrafiltration)  5. Hypertension/volume - pulmonary vasc congestion; about 3 kg above EDW-dialysis today  6. Anemia - Hgb improved from last admission  7. Metabolic bone disease - Hypercalcemic with corr Ca of 12.6 - no zemplar; cannot put on 2 Ca bath due to K level; use 2.25 Ca with 4 K bath here; this was an issue last admission as well; waiting on info to be sent from her HD center.IPTH was 178 with November labs; she is not on zemplar. Need to address this at discharge.  8.  Malnutrition - on renal diet and nepro suppl  9. Type 2 DM - diet controlled     Zetta Bills, MD 05/24/2012, 8:13 AM

## 2012-05-25 ENCOUNTER — Inpatient Hospital Stay (HOSPITAL_COMMUNITY): Payer: Medicare Other

## 2012-05-25 DIAGNOSIS — R079 Chest pain, unspecified: Secondary | ICD-10-CM

## 2012-05-25 LAB — COMPREHENSIVE METABOLIC PANEL
Albumin: 1.8 g/dL — ABNORMAL LOW (ref 3.5–5.2)
BUN: 21 mg/dL (ref 6–23)
Calcium: 9.9 mg/dL (ref 8.4–10.5)
Chloride: 98 mEq/L (ref 96–112)
Creatinine, Ser: 2.66 mg/dL — ABNORMAL HIGH (ref 0.50–1.10)
GFR calc non Af Amer: 18 mL/min — ABNORMAL LOW (ref >90)
Total Bilirubin: 0.3 mg/dL (ref 0.3–1.2)

## 2012-05-25 LAB — ALBUMIN, FLUID (OTHER): Albumin, Fluid: 0.7 g/dL

## 2012-05-25 LAB — PROTEIN, BODY FLUID: Total protein, fluid: 2 g/dL

## 2012-05-25 LAB — GLUCOSE, CAPILLARY: Glucose-Capillary: 149 mg/dL — ABNORMAL HIGH (ref 70–99)

## 2012-05-25 LAB — BODY FLUID CELL COUNT WITH DIFFERENTIAL
Eos, Fluid: 1 %
Lymphs, Fluid: 7 %
Monocyte-Macrophage-Serous Fluid: 81 % (ref 50–90)
Neutrophil Count, Fluid: 11 % (ref 0–25)
Total Nucleated Cell Count, Fluid: 132 cu mm (ref 0–1000)

## 2012-05-25 LAB — CBC WITH DIFFERENTIAL/PLATELET
Basophils Relative: 0 % (ref 0–1)
Eosinophils Absolute: 0.1 10*3/uL (ref 0.0–0.7)
Eosinophils Relative: 1 % (ref 0–5)
Lymphs Abs: 0.9 10*3/uL (ref 0.7–4.0)
MCH: 27.7 pg (ref 26.0–34.0)
MCHC: 31.2 g/dL (ref 30.0–36.0)
MCV: 88.8 fL (ref 78.0–100.0)
Platelets: 175 10*3/uL (ref 150–400)
RBC: 3.57 MIL/uL — ABNORMAL LOW (ref 3.87–5.11)
RDW: 17.2 % — ABNORMAL HIGH (ref 11.5–15.5)

## 2012-05-25 LAB — AMYLASE, BODY FLUID: Amylase, Fluid: 12 U/L

## 2012-05-25 MED ORDER — ALTEPLASE 2 MG IJ SOLR
2.0000 mg | Freq: Once | INTRAMUSCULAR | Status: AC | PRN
  Filled 2012-05-25: qty 2

## 2012-05-25 MED ORDER — PENTAFLUOROPROP-TETRAFLUOROETH EX AERO: 1.0000 "application " | INHALATION_SPRAY | CUTANEOUS | Status: DC | PRN

## 2012-05-25 MED ORDER — HYDROMORPHONE HCL PF 1 MG/ML IJ SOLN: 1.0000 mg | Freq: Once | INTRAMUSCULAR | Status: DC

## 2012-05-25 MED ORDER — LIDOCAINE HCL (PF) 1 % IJ SOLN: 5.0000 mL | INTRAMUSCULAR | Status: DC | PRN

## 2012-05-25 MED ORDER — SODIUM CHLORIDE 0.9 % IV SOLN: 100.0000 mL | INTRAVENOUS | Status: DC | PRN

## 2012-05-25 MED ORDER — HEPARIN SODIUM (PORCINE) 1000 UNIT/ML DIALYSIS
1000.0000 [IU] | INTRAMUSCULAR | Status: DC | PRN
  Filled 2012-05-25: qty 1

## 2012-05-25 MED ORDER — HEPARIN SODIUM (PORCINE) 1000 UNIT/ML DIALYSIS
1600.0000 [IU] | INTRAMUSCULAR | Status: DC | PRN
  Filled 2012-05-25: qty 2

## 2012-05-25 MED ORDER — HYDROMORPHONE HCL PF 1 MG/ML IJ SOLN
0.5000 mg | Freq: Once | INTRAMUSCULAR | Status: AC
  Administered 2012-05-25: 0.5 mg via INTRAVENOUS
  Filled 2012-05-25: qty 1

## 2012-05-25 MED ORDER — HYDROMORPHONE HCL PF 1 MG/ML IJ SOLN
1.0000 mg | INTRAMUSCULAR | Status: DC | PRN
  Administered 2012-05-25 – 2012-05-26 (×5): 1 mg via INTRAVENOUS
  Filled 2012-05-25 (×5): qty 1

## 2012-05-25 MED ORDER — NEPRO/CARBSTEADY PO LIQD
237.0000 mL | ORAL | Status: DC | PRN
  Filled 2012-05-25: qty 237

## 2012-05-25 MED ORDER — LIDOCAINE-PRILOCAINE 2.5-2.5 % EX CREA
1.0000 "application " | TOPICAL_CREAM | CUTANEOUS | Status: DC | PRN
  Filled 2012-05-25: qty 5

## 2012-05-25 NOTE — Progress Notes (Signed)
ANTIBIOTIC CONSULT NOTE - FOLLOW UP  Pharmacy Consult for Vancomycin and Zosyn Indication: Empiric coverage (possible peritonitis, infected pseudocyst with pancreatic necrosis)  Allergies  Allergen Reactions  . Codeine Itching  . Morphine And Related Shortness Of Breath    SOB when given IV once "to me it was mild; I called out fast for help; never had to intubate" (05/23/2012)  . Ace Inhibitors Other (See Comments)    Unknown reaction but her physician stated that she can't take it (specifically lisinopril)  . Tylenol (Acetaminophen) Other (See Comments)    Patient states that the doctor told her she has a Fatty Liver.    Patient Measurements: Height: 5\' 4"  (162.6 cm) Weight: 129 lb 6.6 oz (58.7 kg) IBW/kg (Calculated) : 54.7  Adjusted Body Weight:   Vital Signs: Temp: 98 F (36.7 C) (12/11 0600) Temp src: Oral (12/11 0600) BP: 130/76 mmHg (12/11 1408) Pulse Rate: 76  (12/11 1000) Intake/Output from previous day: 12/10 0701 - 12/11 0700 In: 123 [P.O.:120; I.V.:3] Out: 851 [Urine:325; Stool:1] Intake/Output from this shift: Total I/O In: 123 [P.O.:120; I.V.:3] Out: -   Labs:  Basename 05/25/12 0725 05/24/12 0809 05/24/12 0510 05/23/12 0715  WBC 9.8 10.7* 10.6* --  HGB 9.9* 10.0* 9.4* --  PLT 175 176 167 --  LABCREA -- -- -- --  CREATININE 2.66* 4.95* -- 4.42*   Estimated Creatinine Clearance: 18.9 ml/min (by C-G formula based on Cr of 2.66). No results found for this basename: VANCOTROUGH:2,VANCOPEAK:2,VANCORANDOM:2,GENTTROUGH:2,GENTPEAK:2,GENTRANDOM:2,TOBRATROUGH:2,TOBRAPEAK:2,TOBRARND:2,AMIKACINPEAK:2,AMIKACINTROU:2,AMIKACIN:2, in the last 72 hours   Microbiology: Recent Results (from the past 720 hour(s))  CULTURE, BLOOD (ROUTINE X 2)     Status: Normal   Collection Time   05/07/12  1:15 PM      Component Value Range Status Comment   Specimen Description BLOOD HEMODIALYSIS CATHETER   Final    Special Requests BOTTLES DRAWN AEROBIC AND ANAEROBIC 10CC   Final     Culture  Setup Time 05/07/2012 17:57   Final    Culture NO GROWTH 5 DAYS   Final    Report Status 05/13/2012 FINAL   Final   CULTURE, BLOOD (ROUTINE X 2)     Status: Normal   Collection Time   05/07/12  1:20 PM      Component Value Range Status Comment   Specimen Description BLOOD HEMODIALYSIS CATHETER   Final    Special Requests BOTTLES DRAWN AEROBIC AND ANAEROBIC 10CC   Final    Culture  Setup Time 05/07/2012 17:57   Final    Culture NO GROWTH 5 DAYS   Final    Report Status 05/13/2012 FINAL   Final   URINE CULTURE     Status: Normal   Collection Time   05/08/12 12:36 AM      Component Value Range Status Comment   Specimen Description URINE, CLEAN CATCH   Final    Special Requests NONE   Final    Culture  Setup Time 05/08/2012 13:44   Final    Colony Count >=100,000 COLONIES/ML   Final    Culture     Final    Value: Multiple bacterial morphotypes present, none predominant. Suggest appropriate recollection if clinically indicated.   Report Status 05/09/2012 FINAL   Final   CULTURE, BLOOD (ROUTINE X 2)     Status: Normal   Collection Time   05/10/12 10:42 AM      Component Value Range Status Comment   Specimen Description BLOOD RIGHT FOOT   Final    Special  Requests BOTTLES DRAWN AEROBIC AND ANAEROBIC 10CC   Final    Culture  Setup Time 05/10/2012 18:08   Final    Culture NO GROWTH 5 DAYS   Final    Report Status 05/16/2012 FINAL   Final   CULTURE, BLOOD (ROUTINE X 2)     Status: Normal   Collection Time   05/10/12 10:48 AM      Component Value Range Status Comment   Specimen Description BLOOD LEFT FOOT   Final    Special Requests     Final    Value: BOTTLES DRAWN AEROBIC AND ANAEROBIC AERO 10CC ANA 8CC   Culture  Setup Time 05/10/2012 18:08   Final    Culture     Final    Value: STAPHYLOCOCCUS SPECIES (COAGULASE NEGATIVE)     Note: THE SIGNIFICANCE OF ISOLATING THIS ORGANISM FROM A SINGLE SET OF BLOOD CULTURES WHEN MULTIPLE SETS ARE DRAWN IS UNCERTAIN. PLEASE NOTIFY THE  MICROBIOLOGY DEPARTMENT WITHIN ONE WEEK IF SPECIATION AND SENSITIVITIES ARE REQUIRED.     27 Note: Gram Stain Report Called to,Read Back By and Verified With: DR.HENSEAL AT 8:20PM 11 13 BY THOMI   Report Status 05/13/2012 FINAL   Final   URINE CULTURE     Status: Normal   Collection Time   05/22/12  9:26 PM      Component Value Range Status Comment   Specimen Description URINE, RANDOM   Final    Special Requests ADDED 2220   Final    Culture  Setup Time 05/22/2012 22:33   Final    Colony Count NO GROWTH   Final    Culture NO GROWTH   Final    Report Status 05/23/2012 FINAL   Final   CULTURE, BLOOD (ROUTINE X 2)     Status: Normal (Preliminary result)   Collection Time   05/22/12 11:20 PM      Component Value Range Status Comment   Specimen Description BLOOD RIGHT ARM   Final    Special Requests BOTTLES DRAWN AEROBIC AND ANAEROBIC 10CC EA   Final    Culture  Setup Time 05/23/2012 09:25   Final    Culture     Final    Value:        BLOOD CULTURE RECEIVED NO GROWTH TO DATE CULTURE WILL BE HELD FOR 5 DAYS BEFORE ISSUING A FINAL NEGATIVE REPORT   Report Status PENDING   Incomplete   CULTURE, BLOOD (ROUTINE X 2)     Status: Normal (Preliminary result)   Collection Time   05/22/12 11:54 PM      Component Value Range Status Comment   Specimen Description BLOOD RIGHT HAND   Final    Special Requests BOTTLES DRAWN AEROBIC AND ANAEROBIC 10CC EA   Final    Culture  Setup Time 05/23/2012 09:25   Final    Culture     Final    Value:        BLOOD CULTURE RECEIVED NO GROWTH TO DATE CULTURE WILL BE HELD FOR 5 DAYS BEFORE ISSUING A FINAL NEGATIVE REPORT   Report Status PENDING   Incomplete   SURGICAL PCR SCREEN     Status: Normal   Collection Time   05/23/12  4:20 PM      Component Value Range Status Comment   MRSA, PCR NEGATIVE  NEGATIVE Final    Staphylococcus aureus NEGATIVE  NEGATIVE Final     Anti-infectives     Start     Dose/Rate Route  Frequency Ordered Stop   05/24/12 1200   vancomycin  (VANCOCIN) 750 mg in sodium chloride 0.9 % 150 mL IVPB  Status:  Discontinued        750 mg 150 mL/hr over 60 Minutes Intravenous Every T-Th-Sa (Hemodialysis) 05/22/12 2215 05/23/12 1348   05/24/12 1200   vancomycin (VANCOCIN) 500 mg in sodium chloride 0.9 % 100 mL IVPB        500 mg 100 mL/hr over 60 Minutes Intravenous Every T-Th-Sa (Hemodialysis) 05/23/12 1348     05/23/12 0000   vancomycin (VANCOCIN) 1,250 mg in sodium chloride 0.9 % 250 mL IVPB        1,250 mg 166.7 mL/hr over 90 Minutes Intravenous  Once 05/22/12 2213 05/23/12 0309   05/23/12 0000  piperacillin-tazobactam (ZOSYN) IVPB 2.25 g       2.25 g 100 mL/hr over 30 Minutes Intravenous 3 times per day 05/22/12 2214            Assessment: 61yof on Vancomycin and Zosyn Day 3 for empiric coverage of abdominal infection (possible peritonitis or pancreatic pseudocyst/necrosis). Patient has remained afebrile, WBC trended down to wnl and cultures have reported NGTD. Patient underwent paracentesis today to collect diagnostic fluid/cultures. ID has been consulted and has recommended to continue Vancomycin and Zosyn for now. Patient has ESRD and receives HD on Tue/Thur/Sat - antibiotics have been adjusted appropriately.   Goal of Therapy:  Pre-HD Vancomycin level: 15-25 mcg/ml  Plan:  1. Continue Vancomycin 500mg  IV qHD (TTS) 2. Continue Zosyn 2.25g IV q8h 3. Follow-up cultures, ID recommendations, renal plans and order levels when indicated  Cleon Dew [phone] 05/25/2012,2:31 PM

## 2012-05-25 NOTE — Progress Notes (Signed)
Occupational Therapy Evaluation Patient Details Name: ARILLA HICE MRN: [id number] DOB: 1951-06-13 Today's Date: 05/25/2012 Time: 7829-5621 OT Time Calculation (min): 26 min  OT Assessment / Plan / Recommendation Clinical Impression  61 yo with multiple hospitalizations. ESRD. DM. Pt with pancreatic cyst. Pt will benefit from tub bench, rollator, and toilet riser. Rec HHOT to eval further needs and modifications within the home to maximize safety and independence and reduce readmissions.     OT Assessment  All further OT needs can be met in the next venue of care    Follow Up Recommendations  Home health OT    Barriers to Discharge  none    Equipment Recommendations  Tub/shower bench;Toilet rise with handles;Wheelchair (measurements OT);Wheelchair cushion (measurements OT) (rollator)    Recommendations for Other Services  none  Frequency    eval only   Precautions / Restrictions Precautions Precautions: None Restrictions Weight Bearing Restrictions: No   Pertinent Vitals/Pain No c/o pain    ADL  Eating/Feeding: Independent Where Assessed - Eating/Feeding: Edge of bed Grooming: Modified independent Where Assessed - Grooming: Supported standing Upper Body Bathing: Supervision/safety;Set up Where Assessed - Upper Body Bathing: Unsupported sitting Lower Body Bathing: Set up;Supervision/safety Where Assessed - Lower Body Bathing: Unsupported sit to stand Upper Body Dressing: Supervision/safety;Set up Where Assessed - Upper Body Dressing: Unsupported sitting Lower Body Dressing: Set up;Supervision/safety Where Assessed - Lower Body Dressing: Unsupported sit to stand Toilet Transfer: Supervision/safety;Other (comment) (RW level) Toilet Transfer Method: Sit to Barista: Comfort height toilet Toileting - Clothing Manipulation and Hygiene: Modified independent Where Assessed - Engineer, mining and Hygiene: Standing Equipment Used: Gait  belt;Rolling walker Transfers/Ambulation Related to ADLs: S ADL Comments: Pt overall mod I at baseline. Fatigues easily. Would benefit from [facility] to assess home environment to maximize safety and independence within the home.    OT Diagnosis: Generalized weakness;Acute pain  OT Problem List: Decreased strength;Decreased activity tolerance;Decreased knowledge of use of DME or AE;Cardiopulmonary status limiting activity OT Treatment Interventions:     OT Goals Acute Rehab OT Goals OT Goal Formulation:  (eval only)  Visit Information  Last OT Received On: 05/25/12 Assistance Needed: +1    Subjective Data      Prior Functioning     Home Living Lives With: Alone Available Help at Discharge: Family;Friend(s);Available PRN/intermittently Type of Home: Apartment Home Access: Level entry Home Layout: One level Bathroom Shower/Tub: Tub/shower unit;Curtain Firefighter: Standard (too low) Bathroom Accessibility: Yes How Accessible: Accessible via walker Home Adaptive Equipment: Walker - rolling;Hand-held shower hose;Bedside commode/3-in-1;Wheelchair - manual Additional Comments: BSC does not fit in her BR Prior Function Level of Independence: Needs assistance Needs Assistance: Bathing;Dressing;Light Housekeeping;Meal Prep;Gait;Transfers Bath: Minimal Dressing:  (no assistance needed) Meal Prep: Total Light Housekeeping: Total Gait Assistance: uses her cane in the house and outiside, uses w/c for longer distances Transfer Assistance: mod I Able to Take Stairs?: No Driving: No Vocation: On disability Comments: Has bath aid 2x/week Communication Communication: No difficulties Dominant Hand: Right         Vision/Perception     Cognition  Overall Cognitive Status: Appears within functional limits for tasks assessed/performed Arousal/Alertness: Awake/alert Orientation Level: Appears intact for tasks assessed Behavior During Session: [place] for tasks performed     Extremity/Trunk Assessment Right Upper Extremity Assessment RUE ROM/Strength/Tone: WFL for tasks assessed RUE Sensation: WFL - Light Touch;WFL - Proprioception RUE Coordination: WFL - gross/fine motor Left Upper Extremity Assessment LUE ROM/Strength/Tone: Deficits LUE ROM/Strength/Tone Deficits: edematour LUE. states it  has been swollen since catheter placement LUE Sensation: WFL - Light Touch;WFL - Proprioception LUE Coordination: WFL - gross/fine motor Right Lower Extremity Assessment RLE ROM/Strength/Tone: Deficits RLE ROM/Strength/Tone Deficits: generalized weakness RLE Sensation: WFL - Light Touch;WFL - Proprioception RLE Coordination: WFL - gross motor Left Lower Extremity Assessment LLE ROM/Strength/Tone: Deficits LLE ROM/Strength/Tone Deficits: generalized weakness LLE Sensation: WFL - Light Touch;WFL - Proprioception LLE Coordination: WFL - gross motor Trunk Assessment Trunk Assessment: Normal     Mobility Bed Mobility Bed Mobility: Supine to Sit Supine to Sit: 6: Modified independent (Device/Increase time);HOB flat Transfers Transfers: Sit to Stand;Stand to Sit Sit to Stand: 5: Supervision;With upper extremity assist;From bed Stand to Sit: 5: Supervision;With upper extremity assist;To bed Details for Transfer Assistance: pt able to transfer safely but wants to use no AD at all, encouraged to place hands on RW once standing     Shoulder Instructions     Exercise  WFL Mod I   Balance Balance Balance Assessed: Yes Dynamic Standing Balance Dynamic Standing - Balance Support: During functional activity;No upper extremity supported Dynamic Standing - Level of Assistance: 5: Stand by assistance   End of Session OT - End of Session Equipment Utilized During Treatment: Gait belt Activity Tolerance: Patient tolerated treatment well Patient left: in bed;with call bell/phone within reach Nurse Communication: Mobility status  GO     Kelena Garrow,HILLARY 05/25/2012, 5:11  PM [facility], OTR/L  [phone] 05/25/2012

## 2012-05-25 NOTE — Progress Notes (Signed)
Follow-up  PMT discussion needed to be re-scheduled as patient taken for paracentesis during today's meeting time - step-daughter, [name] at bedside and called to team phone, PMT provider now meeting with another family - this Clinical research associate spoke at bedside with [name] and patient, who had just returned from paracentesis and is alert, and talkative - re-meet scheduled for tomorrow, Thursday 12/12 @ 12 noon   Valente David, RN 05/25/2012, 2:23 PM Palliative Medicine Team RN Liaison [phone]

## 2012-05-25 NOTE — Progress Notes (Signed)
Internal Medicine Teaching Service Attending Note Date: 05/25/2012  Patient name: [name]  Medical record number: [id number]  Date of birth: Jul 18, 1950    This patient has been seen and discussed with the house staff. Please see their note for complete details. 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 Patient will undergo US guided paracentesis (diagnostic) which may help guide the antibiotics. Patient is aware of the plan and is very appreciative of the care so far.   Lars Mage 05/25/2012, 1:25 PM

## 2012-05-25 NOTE — Progress Notes (Signed)
Chart review complete.  Patient is not eligible for [facility] Care Management services because her PCP is not a [facility] primary care provider or is not [facility] affiliated.  For any additional questions or new referrals please contact Tim Henderson BSN RN [facility] Liaison at [phone] °

## 2012-05-25 NOTE — Progress Notes (Signed)
Patient ZO:XWRUEAV CHARNELLE BERGEMAN      DOB: 12-18-1950      WUJ:[id number]  Palliative Care Team Goals of Care scheduled for 12 noon-patient taken for paracentesis-daughter to call PMT phone when patient returns to room so meeting can be done.  Freddie Breech, CNS-C Palliative Medicine Team [facility] Team Phone: [phone] Pager: [phone]

## 2012-05-25 NOTE — Progress Notes (Signed)
Patient ID: [name], female   DOB: [birth year], 61 y.o.   MRN: [id number]   [facility] Progress Note    Subjective:   Reports some improvement in abdominal pain and looking forward to a diagnostic versus therapeutic paracentesis today. She denies any problems at HD yesterday   Objective:   BP 124/77  Pulse 73  Temp 98 F (36.7 C) (Oral)  Resp 18  Ht 5\' 4"  (1.626 m)  Wt 58.7 kg (129 lb 6.6 oz)  BMI 22.21 kg/m2  SpO2 97%  Physical Exam: YQM:VHQIONGEXBM resting in bed- watching TV WUX:LKGMW RRR, ESM over apex appreciated Resp:Decreased bibasal BS otherwise CTA NUU:VOZD, tender diffusely, BS normal GUY:QIHKV LE edema around ankles  Labs: BMET  Lab 05/25/12 0725 05/24/12 0809 05/23/12 0715 05/22/12 0709  NA 135 129* 133* 135  K 3.6 4.4 3.9 3.3*  CL 98 90* 94* 96  CO2 29 26 27 30   GLUCOSE 132* 155* 122* 114*  BUN 21 52* 39* 28*  CREATININE 2.66* 4.95* 4.42* 3.62*  ALB -- -- -- --  CALCIUM 9.9 11.0* 11.0* 10.5  PHOS -- 3.8 -- --   CBC  Lab 05/25/12 0725 05/24/12 0809 05/24/12 0510 05/23/12 0715 05/22/12 0709  WBC 9.8 10.7* 10.6* 13.1* --  NEUTROABS 7.9* -- 8.7* 10.7* 18.3*  HGB 9.9* 10.0* 9.4* 10.9* --  HCT 31.7* 32.2* 29.2* 35.6* --  MCV 88.8 88.0 88.5 89.7 --  PLT 175 176 167 172 --    Medications:      . aspirin EC  81 mg Oral Daily  . carvedilol  6.25 mg Oral BID WC  . enoxaparin (LOVENOX) injection  30 mg Subcutaneous Q24H  . feeding supplement (NEPRO CARB STEADY)  237 mL Oral BID BM  . hydrALAZINE  25 mg Oral Q8H  . HYDROmorphone  2 mg Oral TID  . [COMPLETED] iohexol  20 mL Oral Q1 Hr x 2  . isosorbide mononitrate  30 mg Oral Daily  . methadone  5 mg Oral TID  . multivitamin  1 tablet Oral Daily  . pantoprazole  40 mg Oral Daily  . piperacillin-tazobactam (ZOSYN)  IV  2.25 g Intravenous Q8H  . ranolazine  500 mg Oral Daily  . sevelamer  400 mg Oral Q breakfast  . simvastatin  10 mg Oral q1800  . sodium chloride  3 mL  Intravenous Q12H  . vancomycin  500 mg Intravenous Q T,Th,Sa-HD     Assessment/ Plan:   1. Fever/Abdominal Pain - WBC 20.8 on admission and now down to 9.8 on intravenous vancomycin and Zosyn. Blood cultures and Urine cultures negative to date. Per discussion with the internal medicine teaching service yesterday, would not pursue removal of her dialysis catheter unless there is obvious verification of a bacteremia. Appreciate further input from vascular surgery regarding this matter as well as assistance with evaluation of her left upper arm swelling. Plans noted for diagnostic paracentesis today to help facilitate antibiotic choice. Given the amount of ascites, the paracentesis volume removed should also be therapeutic to some degree. 2. LUE edema - s/p left AVGG placement. 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 The left brachiocephalic vein and SVC are widely patent. It did not appear to be amenable to endovascular intervention due to the presence of left sided pacer wires through the occluded segment." Her vascular surgery, may benefit from arteriovenous graft ligation at some point to help  alleviate left upper extremity swelling. 3. 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 ESRD - TTS -tolerated hemodialysis yesterday. Plan for routine dialysis tomorrow if she is still here. With an EF of 10%, she may benefit from discussions regarding dialysis 4 days a week (one day being sequential ultrafiltration)  5. Anemia - Hgb improved from last admission  6. Metabolic bone disease - Hypercalcemic with corr Ca of 12.6 - no zemplar; cannot put on 2 Ca bath due to K level; use 2.25 Ca with 4 K bath here; this was an issue last admission as well; waiting on info to be sent from her HD center.IPTH was 178 with November  labs; she is not on zemplar. Need to address this at discharge.  7. 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 She remains on a renal diet and nepro supplementation.   8. Type 2 DM - diet controlled     Zetta Bills, MD 05/25/2012, 8:55 AM

## 2012-05-25 NOTE — Progress Notes (Signed)
Physical Therapy Evaluation Patient Details Name: [name] MRN: [id number] DOB: 11-07-1950 Today's Date: 05/25/2012 Time: 4098-1191 PT Time Calculation (min): 37 min  PT Assessment / Plan / Recommendation Clinical Impression  Pt is 61 yo female with complex medical history who returns to [facility] with fever and abdominal pain. Pt would benefit from acute PT to progress mobility and strength given frequent recent admissions. Recommend 4 wheel rollator for d/c and pt also reporting need for elevated toilet seat and new w/c. Recommend continuing with HHPT upon d/c, she has been with [facility]. PT to follow.    PT Assessment  Patient needs continued PT services    Follow Up Recommendations  Home health PT;Supervision - Intermittent    Does the patient have the potential to tolerate intense rehabilitation      Barriers to Discharge Decreased caregiver support      Equipment Recommendations  Other (comment) (rollator)    Recommendations for Other Services OT consult   Frequency Min 3X/week    Precautions / Restrictions Precautions Precautions: None Restrictions Weight Bearing Restrictions: No   Pertinent Vitals/Pain C/o abdominal pain, faces 4/10, RN gave meds      Mobility  Bed Mobility Bed Mobility: Not assessed (pt sitting EOB) Transfers Transfers: Sit to Stand;Stand to Sit Sit to Stand: From bed;With upper extremity assist;5: Supervision Stand to Sit: 5: Supervision;To chair/3-in-1;With upper extremity assist Details for Transfer Assistance: pt able to transfer safely but wants to use no AD at all, encouraged to place hands on RW once standing Ambulation/Gait Ambulation/Gait Assistance: 5: Supervision Ambulation Distance (Feet): 10 Feet Assistive device: Rolling walker Ambulation/Gait Assistance Details: cues for staying with RW and not trying to push IV pole at the same time. Pt deferred further ambulation today due to procedure this morning and abdominal  pain. RW set to appropriate height and left in room. Gait Pattern: Step-through pattern;Decreased stride length;Trunk flexed Gait velocity: decreased Stairs: No Wheelchair Mobility Wheelchair Mobility: No    Shoulder Instructions     Exercises     PT Diagnosis: Generalized weakness;Difficulty walking  PT Problem List: Decreased strength;Decreased range of motion;Decreased activity tolerance;Decreased balance;Decreased mobility;Decreased knowledge of use of DME;Pain;Cardiopulmonary status limiting activity PT Treatment Interventions: DME instruction;Gait training;Functional mobility training;Therapeutic activities;Therapeutic exercise;Balance training;Patient/family education   PT Goals Acute Rehab PT Goals PT Goal Formulation: With patient Time For Goal Achievement: 06/08/12 Potential to Achieve Goals: Good Pt will go Supine/Side to Sit: Independently PT Goal: Supine/Side to Sit - Progress: Goal set today Pt will go Sit to Supine/Side: Independently PT Goal: Sit to Supine/Side - Progress: Goal set today Pt will go Sit to Stand: with modified independence PT Goal: Sit to Stand - Progress: Goal set today Pt will go Stand to Sit: with modified independence PT Goal: Stand to Sit - Progress: Goal set today Pt will Ambulate: >150 feet;with modified independence;with least restrictive assistive device PT Goal: Ambulate - Progress: Goal set today Pt will Perform Home Exercise Program: Independently PT Goal: Perform Home Exercise Program - Progress: Goal set today  Visit Information  Last PT Received On: 05/25/12 Assistance Needed: +1    Subjective Data  Subjective: I can't walk right now, I'm just hurting too much after they drew that fluid off this morning Patient Stated Goal: return hom, be independent   Prior Functioning  Home Living Lives With: Alone Available Help at Discharge: Family;Friend(s);Available PRN/intermittently Type of Home: Apartment Home Access: Level  entry Home Layout: One level Bathroom Shower/Tub: Tub/shower unit [employer]  Toilet: Standard (too low) Bathroom Accessibility: Yes Home Adaptive Equipment: Walker - rolling;Hand-held shower hose;Bedside commode/3-in-1;Wheelchair - manual Additional Comments: pt reports that she needs an elevated toilet seat for her bathroom (the BSC is too wide to fit over her toilet), also a new w/c because hers in old and it does not have foot rests and her feet slide under it. She would also benefit from a 4 wheel rollator to be able to have a seat available as she fatigues quickly and sometimes suddenly Prior Function Level of Independence: Needs assistance Needs Assistance: Bathing;Dressing;Light Housekeeping;Meal Prep;Gait;Transfers Bath: Moderate Meal Prep: Total Light Housekeeping: Total Gait Assistance: uses her cane in the house and outiside, uses w/c for longer distances Able to Take Stairs?: No Driving: No Vocation: On disability Comments: family helps out Communication Communication: No difficulties    Cognition  Overall Cognitive Status: Appears within functional limits for tasks assessed/performed Arousal/Alertness: Awake/alert Orientation Level: Appears intact for tasks assessed Behavior During Session: [facility] for tasks performed    Extremity/Trunk Assessment Right Upper Extremity Assessment RUE ROM/Strength/Tone: WFL for tasks assessed RUE Sensation: WFL - Light Touch RUE Coordination: WFL - gross motor Left Upper Extremity Assessment LUE ROM/Strength/Tone: Deficits LUE ROM/Strength/Tone Deficits: pt with increased swelling LUE from just below the shoulder joint to the distal arm and to a lesser extent, in the hand. Limiting elbow and wrist ROM. Strength NT. Right Lower Extremity Assessment RLE ROM/Strength/Tone: Deficits RLE ROM/Strength/Tone Deficits: generalized weakness; grossly 3-4/5 RLE Sensation: WFL - Light Touch;WFL - Proprioception RLE Coordination: WFL - gross motor Left  Lower Extremity Assessment LLE ROM/Strength/Tone: Deficits LLE ROM/Strength/Tone Deficits: generalized weakness; grossly 3-4/5 LLE Sensation: WFL - Light Touch;WFL - Proprioception LLE Coordination: WFL - gross motor Trunk Assessment Trunk Assessment: Normal   Balance Balance Balance Assessed: Yes Dynamic Standing Balance Dynamic Standing - Balance Support: During functional activity;No upper extremity supported Dynamic Standing - Level of Assistance: 5: Stand by assistance  End of Session PT - End of Session Equipment Utilized During Treatment: Gait belt Activity Tolerance: Patient tolerated treatment well Patient left: in chair;with call bell/phone within reach;with family/visitor present Nurse Communication: Mobility status  GP   Lyanne Co, PT  Acute Rehab Services  [phone]   Lyanne Co 05/25/2012, 3:07 PM

## 2012-05-25 NOTE — Progress Notes (Addendum)
Subjective: Pt c/o 10/10 abdominal pain. She is still having bowel movements.  She is able to tolerate diet w/o ab pain. She states when she walks her abdomen is heavy.    Objective: Vital signs in last 24 hours: Filed Vitals:   05/24/12 1900 05/24/12 2149 05/25/12 0600 05/25/12 1000  BP: 115/64 125/72 124/77 124/69  Pulse: 70 73 73 76  Temp: 98.4 F (36.9 C) 98.1 F (36.7 C) 98 F (36.7 C)   TempSrc: Oral Oral Oral   Resp: 16 18 18    Height:      Weight: 132 lb 4.4 oz (60 kg)  129 lb 6.6 oz (58.7 kg)   SpO2:  98% 97%    Weight change: 5 lb 4.7 oz (2.4 kg)  Intake/Output Summary (Last 24 hours) at 05/25/12 1023 Last data filed at 05/25/12 0959  Gross per 24 hour  Intake    126 ml  Output    851 ml  Net   -725 ml   General: alert and oriented x 3, nad, pleasant, very talkative  HEENT: [place]/at CV: RRR, no rubs or gallops or murmurs, ICD intact, surgical scars to chest; right chest with HD cath intact Lungs: mild crackles left lung field otherwise cta right Ab: mod ttp epigastric, LUQ, LLQ, hypoactive bs, +distension Extremities: warm, no cyanosis or edema except left upper extremity with edema no change since 12/10. No erythema or drainage s/p graft LUE Neuro: CN2-12 grossly intact; moving all 4 extremities   Lab Results: Basic Metabolic Panel:  Lab 05/25/12 1610 05/24/12 0809  NA 135 129*  K 3.6 4.4  CL 98 90*  CO2 29 26  GLUCOSE 132* 155*  BUN 21 52*  CREATININE 2.66* 4.95*  CALCIUM 9.9 11.0*  MG -- --  PHOS -- 3.8   Liver Function Tests:  Lab 05/25/12 0725 05/24/12 0809 05/23/12 0715  AST 14 -- 22  ALT 11 -- 17  ALKPHOS 126* -- 172*  BILITOT 0.3 -- 0.3  PROT 6.5 -- 6.8  ALBUMIN 1.8* 1.8* --    Lab 05/22/12 1246  LIPASE 9*  AMYLASE --   CBC:  Lab 05/25/12 0725 05/24/12 0809 05/24/12 0510  WBC 9.8 10.7* --  NEUTROABS 7.9* -- 8.7*  HGB 9.9* 10.0* --  HCT 31.7* 32.2* --  MCV 88.8 88.0 --  PLT 175 176 --   Cardiac Enzymes:  Lab 05/22/12 1608  05/22/12 0709  CKTOTAL -- --  CKMB -- --  CKMBINDEX -- --  TROPONINI <0.30 <0.30   Urinalysis:  Lab 05/22/12 2126  COLORURINE YELLOW  LABSPEC 1.017  PHURINE 8.5*  GLUCOSEU NEGATIVE  HGBUR NEGATIVE  BILIRUBINUR NEGATIVE  KETONESUR NEGATIVE  PROTEINUR 100*  UROBILINOGEN 0.2  NITRITE NEGATIVE  LEUKOCYTESUR SMALL*   Misc. Labs: none  Micro Results: Recent Results (from the past 240 hour(s))  URINE CULTURE     Status: Normal   Collection Time   05/22/12  9:26 PM      Component Value Range Status Comment   Specimen Description URINE, RANDOM   Final    Special Requests ADDED 2220   Final    Culture  Setup Time 05/22/2012 22:33   Final    Colony Count NO GROWTH   Final    Culture NO GROWTH   Final    Report Status 05/23/2012 FINAL   Final   CULTURE, BLOOD (ROUTINE X 2)     Status: Normal (Preliminary result)   Collection Time   05/22/12 11:20 PM  Component Value Range Status Comment   Specimen Description BLOOD RIGHT ARM   Final    Special Requests BOTTLES DRAWN AEROBIC AND ANAEROBIC 10CC EA   Final    Culture  Setup Time 05/23/2012 09:25   Final    Culture     Final    Value:        BLOOD CULTURE RECEIVED NO GROWTH TO DATE CULTURE WILL BE HELD FOR 5 DAYS BEFORE ISSUING A FINAL NEGATIVE REPORT   Report Status PENDING   Incomplete   CULTURE, BLOOD (ROUTINE X 2)     Status: Normal (Preliminary result)   Collection Time   05/22/12 11:54 PM      Component Value Range Status Comment   Specimen Description BLOOD RIGHT HAND   Final    Special Requests BOTTLES DRAWN AEROBIC AND ANAEROBIC 10CC EA   Final    Culture  Setup Time 05/23/2012 09:25   Final    Culture     Final    Value:        BLOOD CULTURE RECEIVED NO GROWTH TO DATE CULTURE WILL BE HELD FOR 5 DAYS BEFORE ISSUING A FINAL NEGATIVE REPORT   Report Status PENDING   Incomplete   SURGICAL PCR SCREEN     Status: Normal   Collection Time   05/23/12  4:20 PM      Component Value Range Status Comment   MRSA, PCR NEGATIVE   NEGATIVE Final    Staphylococcus aureus NEGATIVE  NEGATIVE Final    Studies/Results: Ct Abdomen Pelvis W Contrast  05/24/2012  *RADIOLOGY REPORT*  Clinical Data: Lower abdominal pain.  Evaluate the pancreas (history of pancreatitis).  Fever of unknown origin.  CT ABDOMEN AND PELVIS WITH CONTRAST  Technique:  Multidetector CT imaging of the abdomen and pelvis was performed following the standard protocol during bolus administration of intravenous contrast.  Contrast: OMNIPAQUE IOHEXOL 300 MG/ML  SOLN  Comparison: CT of the abdomen and pelvis 02/23/2012.  Findings:  Lung Bases: Severe cardiomegaly with marked dilatation of the left ventricle.  Atherosclerotic calcifications in the left anterior descending, left circumflex and right coronary arteries.  Pacemaker / AICD leads terminating in the right atrium and right ventricular apex, as well as overlying the lateral wall of the left ventricle (via the coronary sinus).  Small bilateral pleural effusions (right greater than left).  Dependent atelectasis in the lower lobes of the lungs bilaterally.  Abdomen/Pelvis:  Status post cholecystectomy.  There is an unusual pattern of hypoperfusion throughout the central liver which partially normalizes on the delayed phase images.  In segment 3 there is an ill-defined 1.8 cm lesion which is incompletely characterized.  This is similar in size to the prior examination 02/23/2012.  The gallbladder has been removed.  The pancreas is difficult to discretely visualized on either phase of the examination, such that an acute pancreatitis is suspect. Additionally, in the tail of the pancreas extending toward the splenic hilum there is an abnormal ill-defined fluid and gas collection, suspicious for a complex infected pseudocyst.   The adrenal glands are poorly visualized.  Parenchymal thinning is noted the kidneys bilaterally.  Numerous low attenuation lesions are noted in the kidneys bilaterally, likely represents small  cysts.  Extensive atherosclerosis throughout the abdominal and pelvic vasculature, without definite aneurysm or dissection.  Large volume of ascites. The appendix is normal.  Numerous colonic diverticula.  Accurate assessment for diverticulitis is not possible on this examination given the large volume of ascites. Status post hysterectomy.  Ovaries are not confidently identified and may be surgically absent or atrophic.  Low-lying pelvic floor, suggesting pelvic organ prolapse.  Specifically, there is a large amount of ascites between the rectum and posterior wall of the vagina (i.e., a peritoneocele).  Musculoskeletal: There are no aggressive appearing lytic or blastic lesions noted in the visualized portions of the skeleton.  IMPRESSION:  1.  The pancreas is very difficult to visualize on today's examination, suggesting significant pancreatic inflammation and/or necrosis.  This is most profound in the region of the tail of the pancreas where there is an abnormal fluid and gas collection which extends posterolaterally via the gastrosplenic ligament and partially encircles the spleen.   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 2.  Large volume of ascites. 3.  Unusual hypoperfusion in the central liver is of uncertain etiology and significance.  Correlation with liver function tests may be warranted. 4.  Normal appendix. 5.  Numerous colonic diverticula.  Accurate assessment for diverticulitis is not possible on this examination given the large volume of ascites. 6.  Severe cardiomegaly with marked left ventricular dilatation. 7.  Small bilateral pleural effusions. 8.  Atherosclerosis, including at least three vessel coronary artery disease. 9.  Additional incidental findings, as above, similar to prior studies.  These results were called by telephone on 05/24/2012 at 03:23 p.m. to Dr. Eben Burow, who verbally acknowledged these results.   Original  Report Authenticated By: Trudie Reed, M.D.    Medications:  Scheduled Meds:    . aspirin EC  81 mg Oral Daily  . carvedilol  6.25 mg Oral BID WC  . enoxaparin (LOVENOX) injection  30 mg Subcutaneous Q24H  . feeding supplement (NEPRO CARB STEADY)  237 mL Oral BID BM  . hydrALAZINE  25 mg Oral Q8H  . [COMPLETED]  HYDROmorphone (DILAUDID) injection  0.5 mg Intravenous Once  . HYDROmorphone  2 mg Oral TID  . [COMPLETED] iohexol  20 mL Oral Q1 Hr x 2  . isosorbide mononitrate  30 mg Oral Daily  . methadone  5 mg Oral TID  . multivitamin  1 tablet Oral Daily  . pantoprazole  40 mg Oral Daily  . piperacillin-tazobactam (ZOSYN)  IV  2.25 g Intravenous Q8H  . ranolazine  500 mg Oral Daily  . sevelamer  400 mg Oral Q breakfast  . simvastatin  10 mg Oral q1800  . sodium chloride  3 mL Intravenous Q12H  . vancomycin  500 mg Intravenous Q T,Th,Sa-HD  . [DISCONTINUED]  HYDROmorphone (DILAUDID) injection  1 mg Intravenous Once   Continuous Infusions:  PRN Meds:.sodium chloride, sodium chloride, alteplase, calcium carbonate (dosed in mg elemental calcium), camphor-menthol, docusate sodium, feeding supplement (NEPRO CARB STEADY), heparin, heparin, HYDROmorphone (DILAUDID) injection, hydrOXYzine, [COMPLETED] iohexol, lidocaine, lidocaine-prilocaine, nitroGLYCERIN, ondansetron (ZOFRAN) IV, ondansetron, pentafluoroprop-tetrafluoroeth, sorbitol, zolpidem, [DISCONTINUED] sodium chloride [DISCONTINUED] sodium chloride, [DISCONTINUED] alteplase, [DISCONTINUED] heparin, [DISCONTINUED] heparin, [DISCONTINUED] HYDROmorphone, [DISCONTINUED] lidocaine, [DISCONTINUED] lidocaine-prilocaine, [DISCONTINUED] pentafluoroprop-tetrafluoroeth  Assessment/Plan: 61 y.o female extensive PMH and significant cardiac history presents for resolved chest pain which was probably typical angina after work up with cardiac enzymes neg x 3.  She also had abdominal pain was found to have leukocytosis and spiked a Tmax of 100.5 this  admission.     1. Abdominal pain secondary to concerning for complex infected pseudoocyst with pancreatic necrosis Likely secondary to pancreatic inflammation and/necrosis noted on CT.  Most profound in the region of the tail of the pancreas where there is abnormal fluid  and gas extending posterolaterally via the gastrosplenic ligament and partially encircles the spleen.  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  This will be a perpetual problem as the patient does not want surgical intervention i.e drainage of infected pseudocyst which is the treatment of choice.  Patient has an EF of 10% and was thought not to be a surgical candidate in the past for this. She will continue to have leukocytosis, fever, increased risk for SIRS/sepsis in the future with multiple admission for this.  Pain will continue to possibly be an issue.    Plan  -Continue Dilaudid 2mg  tid, Methadone 5 mg tid, Dilaudid 1 mg q3 prn -pending US paracentesis for diagnostic and therapeutic reasons pending Albumin, Amylase, cell count and differential, fungus, protein.   -Continue Vanco/Zosyn -PC meeting today at 12 PM to discuss  goals of care this admission and the future, pain control issues chronically and discuss what she will do in the future if this keeps happening.    2. Ascites  -She goes to [facility] for paracenteses q2 weeks for unknown duration  -Etiology can be from pancreas less likely from liver but patient does have fatty liver.   -Getting a therapuetic and diagnostic guided [place] today   3. 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  They will remove graft in the future, not urgent   4. Leukocytosis, resolved -Etiology likely pancreas but could be right chest HD cath as source less likely left arm graft -pending blood cultures to follow so far NTD.  -continue Vanc TThSat/Zosyn 12/8 (Day 3) will continue as she seems to be improving with antibiotics though  no source.  If she is clinically declining consider other agents i.e Zyvoxx or Tygacycline instead of Vanco and Mycafungin -ID and [facility]VS following -Renal stated do not remove right chest catheter until blood cultures resulted    5. ESRD on HD TThSat  -consulted [facility], [facility]VS following for left upper extremity graft site and right chest HD site  6. DVT Px  -Lovenox   7. F/E/N  -will monitor labs -cardiac diet   Dispo: Disposition is deferred at this time, awaiting improvement of current medical problems.  Anticipated discharge in approximately 2-3 day(s).   The patient does have a current PCP (ARMOUR, ROSS B, MD), therefore will be requiring OPC follow-up after discharge.   The patient does not have transportation limitations that hinder transportation to clinic appointments.  .Services Needed at time of discharge: Y = Yes, Blank = No PT:   OT:   RN:   Equipment:   Other:     LOS: 3 days   Annett Gula [phone] 05/25/2012, 10:23 AM

## 2012-05-25 NOTE — Progress Notes (Signed)
[facility]   Days of  4 antibiotics:  Day # 4 of vancomycin  Day # 4of zosyn   Subjective:  " a better day, less pain post paracentesis   Antibiotics:  Anti-infectives     Start     Dose/Rate Route Frequency Ordered Stop   05/24/12 1200   vancomycin (VANCOCIN) 750 mg in sodium chloride 0.9 % 150 mL IVPB  Status:  Discontinued        750 mg 150 mL/hr over 60 Minutes Intravenous Every T-Th-Sa (Hemodialysis) 05/22/12 2215 05/23/12 1348   05/24/12 1200   vancomycin (VANCOCIN) 500 mg in sodium chloride 0.9 % 100 mL IVPB        500 mg 100 mL/hr over 60 Minutes Intravenous Every T-Th-Sa (Hemodialysis) 05/23/12 1348     05/23/12 0000   vancomycin (VANCOCIN) 1,250 mg in sodium chloride 0.9 % 250 mL IVPB        1,250 mg 166.7 mL/hr over 90 Minutes Intravenous  Once 05/22/12 2213 05/23/12 0309   05/23/12 0000  piperacillin-tazobactam (ZOSYN) IVPB 2.25 g       2.25 g 100 mL/hr over 30 Minutes Intravenous 3 times per day 05/22/12 2214            Medications: Scheduled Meds:    . aspirin EC  81 mg Oral Daily  . carvedilol  6.25 mg Oral BID WC  . enoxaparin (LOVENOX) injection  30 mg Subcutaneous Q24H  . feeding supplement (NEPRO CARB STEADY)  237 mL Oral BID BM  . hydrALAZINE  25 mg Oral Q8H  . [COMPLETED]  HYDROmorphone (DILAUDID) injection  0.5 mg Intravenous Once  . HYDROmorphone  2 mg Oral TID  . isosorbide mononitrate  30 mg Oral Daily  . methadone  5 mg Oral TID  . multivitamin  1 tablet Oral Daily  . pantoprazole  40 mg Oral Daily  . piperacillin-tazobactam (ZOSYN)  IV  2.25 g Intravenous Q8H  . ranolazine  500 mg Oral Daily  . sevelamer  400 mg Oral Q breakfast  . simvastatin  10 mg Oral q1800  . sodium chloride  3 mL Intravenous Q12H  . vancomycin  500 mg Intravenous Q T,Th,Sa-HD  . [DISCONTINUED]  HYDROmorphone (DILAUDID) injection  1 mg Intravenous Once   Continuous Infusions:  PRN Meds:.sodium chloride, sodium chloride,  alteplase, calcium carbonate (dosed in mg elemental calcium), camphor-menthol, docusate sodium, feeding supplement (NEPRO CARB STEADY), heparin, heparin, HYDROmorphone (DILAUDID) injection, hydrOXYzine, lidocaine, lidocaine-prilocaine, nitroGLYCERIN, ondansetron (ZOFRAN) IV, ondansetron, pentafluoroprop-tetrafluoroeth, sorbitol, zolpidem, [DISCONTINUED] sodium chloride, [DISCONTINUED] sodium chloride [DISCONTINUED] alteplase, [DISCONTINUED] heparin, [DISCONTINUED] heparin, [DISCONTINUED] HYDROmorphone, [DISCONTINUED] lidocaine, [DISCONTINUED] lidocaine-prilocaine, [DISCONTINUED] pentafluoroprop-tetrafluoroeth   Objective: Weight change: 5 lb 4.7 oz (2.4 kg)  Intake/Output Summary (Last 24 hours) at 05/25/12 1714 Last data filed at 05/25/12 0959  Gross per 24 hour  Intake    123 ml  Output    851 ml  Net   -728 ml   Blood pressure 124/70, pulse 83, temperature 98.9 F (37.2 C), temperature source Oral, resp. rate 20, height 5\' 4"  (1.626 m), weight 129 lb 6.6 oz (58.7 kg), SpO2 96.00%. Temp:  [98 F (36.7 C)-98.9 F (37.2 C)] 98.9 F (37.2 C) (12/11 1430) Pulse Rate:  [68-83] 83  (12/11 1549) Resp:  [16-20] 20  (12/11 1430) BP: (107-145)/(60-97) 124/70 mmHg (12/11 1549) SpO2:  [96 %-98 %] 96 % (12/11 1430) Weight:  [129 lb 6.6 oz (58.7 kg)-132 lb 4.4 oz (60 kg)] 129 lb  6.6 oz (58.7 kg) (12/11 0600)  Physical Exam: General: Alert and awake, oriented x3, not in any acute distress.  HEENT: anicteric sclera, pupils reactive to light and accommodation, EOMI, oropharynx clear and without exudate  CVS regular rate, normal r, no murmur rubs or gallops  Chest: clear to auscultation bilaterally, no wheezing, rales or rhonchi  Abdomen: Tender to palpation throughout with distention. Difficult to percuss her abdomen due to her exquisite tenderness to palpation. Not clear that she has rebound tenderness,  Extremities left upper arm is grossly edematous there is a thrill audible over her graft site.  No obvious purulence arm is tender.  Skin: HD catheter site is without purulence  Neuro: nonfocal, strength and sensation intact   Lab Results:  Basename 05/25/12 0725 05/24/12 0809  WBC 9.8 10.7*  HGB 9.9* 10.0*  HCT 31.7* 32.2*  PLT 175 176    BMET  Basename 05/25/12 0725 05/24/12 0809  NA 135 129*  K 3.6 4.4  CL 98 90*  CO2 29 26  GLUCOSE 132* 155*  BUN 21 52*  CREATININE 2.66* 4.95*  CALCIUM 9.9 11.0*    Micro Results: Recent Results (from the past 240 hour(s))  URINE CULTURE     Status: Normal   Collection Time   05/22/12  9:26 PM      Component Value Range Status Comment   Specimen Description URINE, RANDOM   Final    Special Requests ADDED 2220   Final    Culture  Setup Time 05/22/2012 22:33   Final    Colony Count NO GROWTH   Final    Culture NO GROWTH   Final    Report Status 05/23/2012 FINAL   Final   CULTURE, BLOOD (ROUTINE X 2)     Status: Normal (Preliminary result)   Collection Time   05/22/12 11:20 PM      Component Value Range Status Comment   Specimen Description BLOOD RIGHT ARM   Final    Special Requests BOTTLES DRAWN AEROBIC AND ANAEROBIC 10CC EA   Final    Culture  Setup Time 05/23/2012 09:25   Final    Culture     Final    Value:        BLOOD CULTURE RECEIVED NO GROWTH TO DATE CULTURE WILL BE HELD FOR 5 DAYS BEFORE ISSUING A FINAL NEGATIVE REPORT   Report Status PENDING   Incomplete   CULTURE, BLOOD (ROUTINE X 2)     Status: Normal (Preliminary result)   Collection Time   05/22/12 11:54 PM      Component Value Range Status Comment   Specimen Description BLOOD RIGHT HAND   Final    Special Requests BOTTLES DRAWN AEROBIC AND ANAEROBIC 10CC EA   Final    Culture  Setup Time 05/23/2012 09:25   Final    Culture     Final    Value:        BLOOD CULTURE RECEIVED NO GROWTH TO DATE CULTURE WILL BE HELD FOR 5 DAYS BEFORE ISSUING A FINAL NEGATIVE REPORT   Report Status PENDING   Incomplete   SURGICAL PCR SCREEN     Status: Normal   Collection Time     05/23/12  4:20 PM      Component Value Range Status Comment   MRSA, PCR NEGATIVE  NEGATIVE Final    Staphylococcus aureus NEGATIVE  NEGATIVE Final     Studies/Results: Ct Abdomen Pelvis W Contrast  05/24/2012  *RADIOLOGY REPORT*  Clinical Data: Lower  abdominal pain.  Evaluate the pancreas (history of pancreatitis).  Fever of unknown origin.  CT ABDOMEN AND PELVIS WITH CONTRAST  Technique:  Multidetector CT imaging of the abdomen and pelvis was performed following the standard protocol during bolus administration of intravenous contrast.  Contrast: OMNIPAQUE IOHEXOL 300 MG/ML  SOLN  Comparison: CT of the abdomen and pelvis 02/23/2012.  Findings:  Lung Bases: Severe cardiomegaly with marked dilatation of the left ventricle.  Atherosclerotic calcifications in the left anterior descending, left circumflex and right coronary arteries.  Pacemaker / AICD leads terminating in the right atrium and right ventricular apex, as well as overlying the lateral wall of the left ventricle (via the coronary sinus).  Small bilateral pleural effusions (right greater than left).  Dependent atelectasis in the lower lobes of the lungs bilaterally.  Abdomen/Pelvis:  Status post cholecystectomy.  There is an unusual pattern of hypoperfusion throughout the central liver which partially normalizes on the delayed phase images.  In segment 3 there is an ill-defined 1.8 cm lesion which is incompletely characterized.  This is similar in size to the prior examination 02/23/2012.  The gallbladder has been removed.  The pancreas is difficult to discretely visualized on either phase of the examination, such that an acute pancreatitis is suspect. Additionally, in the tail of the pancreas extending toward the splenic hilum there is an abnormal ill-defined fluid and gas collection, suspicious for a complex infected pseudocyst.   The adrenal glands are poorly visualized.  Parenchymal thinning is noted the kidneys bilaterally.  Numerous  low attenuation lesions are noted in the kidneys bilaterally, likely represents small cysts.  Extensive atherosclerosis throughout the abdominal and pelvic vasculature, without definite aneurysm or dissection.  Large volume of ascites. The appendix is normal.  Numerous colonic diverticula.  Accurate assessment for diverticulitis is not possible on this examination given the large volume of ascites. Status post hysterectomy.  Ovaries are not confidently identified and may be surgically absent or atrophic.  Low-lying pelvic floor, suggesting pelvic organ prolapse.  Specifically, there is a large amount of ascites between the rectum and posterior wall of the vagina (i.e., a peritoneocele).  Musculoskeletal: There are no aggressive appearing lytic or blastic lesions noted in the visualized portions of the skeleton.  IMPRESSION:  1.  The pancreas is very difficult to visualize on today's examination, suggesting significant pancreatic inflammation and/or necrosis.  This is most profound in the region of the tail of the pancreas where there is an abnormal fluid and gas collection which extends posterolaterally via the gastrosplenic ligament and partially encircles the spleen.   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 2.  Large volume of ascites. 3.  Unusual hypoperfusion in the central liver is of uncertain etiology and significance.  Correlation with liver function tests may be warranted. 4.  Normal appendix. 5.  Numerous colonic diverticula.  Accurate assessment for diverticulitis is not possible on this examination given the large volume of ascites. 6.  Severe cardiomegaly with marked left ventricular dilatation. 7.  Small bilateral pleural effusions. 8.  Atherosclerosis, including at least three vessel coronary artery disease. 9.  Additional incidental findings, as above, similar to prior studies.  These results were called by telephone on  05/24/2012 at 03:23 p.m. to Dr. Eben Burow, who verbally acknowledged these results.   Original Report Authenticated By: Trudie Reed, M.D.    US Paracentesis  05/25/2012  *RADIOLOGY REPORT*  Clinical Data: Recurrent ascites.  Request has been made for  therapeutic and diagnostic paracentesis  ULTRASOUND GUIDED PARACENTESIS  Comparison:  None at this facility  An ultrasound guided paracentesis was thoroughly discussed with the patient and questions answered.  The benefits, risks, alternatives and complications were also discussed.  The patient understands and wishes to proceed with the procedure.  Written consent was obtained.  Ultrasound was performed to localize and mark an adequate pocket of fluid in the right upper quadrant of the abdomen.  The area was then prepped and draped in the normal sterile fashion.  1% Lidocaine was used for local anesthesia.  Under ultrasound guidance a 19 gauge Yueh catheter was introduced.  Paracentesis was performed.  The catheter was removed and a dressing applied.  Complications:  None immediate  Findings:  A total of approximately 1.5 liters of hazy yellow fluid was removed.  A fluid sample was sent for laboratory analysis.  IMPRESSION: Successful ultrasound guided paracentesis yielding 1.5 liters of ascites.  Read by: Anselm Pancoast, P.A.-C   Original Report Authenticated By: Tacey Ruiz, MD       Assessment/Plan: STEPHINIE BATTISTI is a 61 y.o. female with  with ESRD, cirrhosis, ischemic cardiomyopathy, prior pancreatectomy, prior polymicrobial bacteremia and peritonitis that developed while she was on peritoneal dialysis and when she had developed a perforated viscus now admitted with chest pain, abdominal pain and fevers. Blood cultures so far no growth. CT showing signficant pancreatic inflammation and possibly necrosis, massive amount of ascites. 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 The left brachiocephalic vein and SVC are widely patent. It did not appear to be amenable to endovascular intervention due to the presence of left sided pacer wires through the occluded segment." and has been evaluated here by Dr. Myra Gianotti for her LUE edema that is consequence of this  US guided paracentesis with only few hundred wbc predominantly monocytes.   #1 Fevers: Ascitic fluid not terribly impressive for infection. Perhaps fevers are driven by an  Infected necrotic pancreatic cyst   --continue vanco, zosyn for now --follow cultures --could discuss with IR/[facility] IF any options for draining or addressing this area (does not sound likely)    #2 Infection Prevention:  This patient should In My opinion be on contact precautions. 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    LOS: 3 days    [facility]  [facility]  [phone] (pager)  [phone] (office)   Paulette Blanch Dam 05/25/2012, 5:14 PM

## 2012-05-26 DIAGNOSIS — R1032 Left lower quadrant pain: Secondary | ICD-10-CM | POA: Diagnosis present

## 2012-05-26 DIAGNOSIS — R630 Anorexia: Secondary | ICD-10-CM | POA: Diagnosis present

## 2012-05-26 LAB — RENAL FUNCTION PANEL
CO2: 26 mEq/L (ref 19–32)
Calcium: 11.1 mg/dL — ABNORMAL HIGH (ref 8.4–10.5)
Creatinine, Ser: 3.3 mg/dL — ABNORMAL HIGH (ref 0.50–1.10)
GFR calc Af Amer: 16 mL/min — ABNORMAL LOW (ref >90)
Glucose, Bld: 88 mg/dL (ref 70–99)
Phosphorus: 4 mg/dL (ref 2.3–4.6)
Sodium: 133 mEq/L — ABNORMAL LOW (ref 135–145)

## 2012-05-26 LAB — GLUCOSE, CAPILLARY
Glucose-Capillary: 91 mg/dL (ref 70–99)
Glucose-Capillary: 107 mg/dL — ABNORMAL HIGH (ref 70–99)
Glucose-Capillary: 148 mg/dL — ABNORMAL HIGH (ref 70–99)

## 2012-05-26 LAB — CBC
HCT: 31.9 % — ABNORMAL LOW (ref 36.0–46.0)
Hemoglobin: 9.5 g/dL — ABNORMAL LOW (ref 12.0–15.0)
RBC: 3.51 MIL/uL — ABNORMAL LOW (ref 3.87–5.11)
WBC: 8.9 10*3/uL (ref 4.0–10.5)

## 2012-05-26 LAB — PROTIME-INR
INR: 1.29 (ref 0.00–1.49)
Prothrombin Time: 15.8 seconds — ABNORMAL HIGH (ref 11.6–15.2)

## 2012-05-26 LAB — PATHOLOGIST SMEAR REVIEW

## 2012-05-26 MED ORDER — HYDROMORPHONE HCL 2 MG PO TABS
4.0000 mg | ORAL_TABLET | ORAL | Status: DC
  Administered 2012-05-26 – 2012-05-27 (×6): 4 mg via ORAL
  Filled 2012-05-26 (×6): qty 2

## 2012-05-26 MED ORDER — HYDROMORPHONE HCL PF 1 MG/ML IJ SOLN
INTRAMUSCULAR | Status: AC
  Administered 2012-05-26: 1 mg
  Filled 2012-05-26: qty 1

## 2012-05-26 MED ORDER — SENNOSIDES-DOCUSATE SODIUM 8.6-50 MG PO TABS
1.0000 | ORAL_TABLET | Freq: Two times a day (BID) | ORAL | Status: DC
  Administered 2012-05-26 – 2012-05-27 (×2): 1 via ORAL
  Filled 2012-05-26 (×4): qty 1

## 2012-05-26 MED ORDER — METRONIDAZOLE 500 MG PO TABS: 500.0000 mg | ORAL_TABLET | Freq: Three times a day (TID) | ORAL | Status: DC

## 2012-05-26 MED ORDER — HYDROMORPHONE HCL 2 MG PO TABS
ORAL_TABLET | ORAL | Status: AC
  Administered 2012-05-26: 2 mg via ORAL
  Filled 2012-05-26: qty 1

## 2012-05-26 MED ORDER — DARBEPOETIN ALFA-POLYSORBATE 100 MCG/0.5ML IJ SOLN
100.0000 ug | INTRAMUSCULAR | Status: DC
  Administered 2012-05-26: 100 ug via INTRAVENOUS

## 2012-05-26 MED ORDER — NITROGLYCERIN 0.4 MG SL SUBL: 0.4000 mg | SUBLINGUAL_TABLET | SUBLINGUAL | Status: DC | PRN

## 2012-05-26 MED ORDER — HYDROMORPHONE HCL 2 MG PO TABS: 1.0000 mg | ORAL_TABLET | ORAL | Status: DC | PRN

## 2012-05-26 MED ORDER — METRONIDAZOLE 500 MG PO TABS
500.0000 mg | ORAL_TABLET | Freq: Three times a day (TID) | ORAL | Status: DC
  Administered 2012-05-26 – 2012-05-27 (×3): 500 mg via ORAL
  Filled 2012-05-26 (×6): qty 1

## 2012-05-26 MED ORDER — DARBEPOETIN ALFA-POLYSORBATE 100 MCG/0.5ML IJ SOLN
INTRAMUSCULAR | Status: AC
  Administered 2012-05-26: 100 ug via INTRAVENOUS
  Filled 2012-05-26: qty 0.5

## 2012-05-26 NOTE — Progress Notes (Signed)
[facility]  Patient Status: Active (receiving services up to time of hospitalization)  [facility] is providing the following services: RN, PT, OT and HHA  If patient discharges after hours, please call [phone].   [name] 05/26/2012, 3:14 PM

## 2012-05-26 NOTE — Discharge Summary (Signed)
Internal Medicine Teaching [facility] Discharge Note  Name: [name] MRN: [id number] DOB: 1951/04/20 61 y.o.  Date of Admission: 05/22/2012  6:55 AM Date of Discharge: 05/27/2012 Attending Physician: Aletta Edouard, MD  Discharge Diagnosis: 1. Typical angina with chest pain resolved  2. Abdominal pain secondary to concerning for complex infected pseudoocyst with pancreatic necrosis  3. Ascites  4. LUE edema  5. ESRD on HD TThSat  6. Resolved fever 7. Resolved leukocytosis     Discharge Medications:   Medication List     As of 05/27/2012  4:34 PM    STOP taking these medications         senna 8.6 MG Tabs   Commonly known as: SENOKOT      TAKE these medications         aspirin 81 MG EC tablet   Take 1 tablet (81 mg total) by mouth daily.      carvedilol 6.25 MG tablet   Commonly known as: COREG   Take 1 tablet (6.25 mg total) by mouth 2 (two) times daily with a meal.      Ceftazidime 2 G Solr   Commonly known as: FORTAZ   Inject 2 g into the vein every hemodialysis (Tuesday, Thursday, Saturday for 5 more doses ).   Start taking on: 05/28/2012      darbepoetin 100 MCG/0.5ML Soln   Commonly known as: ARANESP   Inject 100 mcg into the vein See admin instructions. In dialysis      feeding supplement (NEPRO CARB STEADY) Liqd   Take 237 mLs by mouth 2 (two) times daily with breakfast and lunch.      hydrALAZINE 25 MG tablet   Commonly known as: APRESOLINE   Take 1 tablet (25 mg total) by mouth every 8 (eight) hours.      HYDROmorphone 2 MG tablet   Commonly known as: DILAUDID   Take 0.5 tablets (1 mg total) by mouth every 2 (two) hours as needed.      HYDROmorphone 4 MG tablet   Commonly known as: DILAUDID   Take 1 tablet (4 mg total) by mouth every 4 (four) hours.      isosorbide mononitrate 30 MG 24 hr tablet   Commonly known as: IMDUR   Take 1 tablet (30 mg total) by mouth daily.      Magnesium Oxide 420 MG Tabs   Take 1 tablet by mouth  daily.      methadone 5 MG tablet   Commonly known as: DOLOPHINE   Take 1 tablet (5 mg total) by mouth 3 (three) times daily. Takes along with Dilaudid.      metroNIDAZOLE 500 MG tablet   Commonly known as: FLAGYL   Take 1 tablet (500 mg total) by mouth every 8 (eight) hours.      multivitamin Tabs tablet   Take 1 tablet by mouth daily.      nitroGLYCERIN 0.4 MG SL tablet   Commonly known as: NITROSTAT   Place 1 tablet (0.4 mg total) under the tongue every 5 (five) minutes as needed for chest pain (CP or SOB).      pantoprazole 40 MG tablet   Commonly known as: PROTONIX   Take 40 mg by mouth daily.      pravastatin 20 MG tablet   Commonly known as: PRAVACHOL   Take 20 mg by mouth at bedtime.      ranolazine 500 MG 12 hr tablet   Commonly known as: RANEXA  Take 500 mg by mouth daily.      senna-docusate 8.6-50 MG per tablet   Commonly known as: Senokot-S   Take 1 tablet by mouth 2 (two) times daily.      sevelamer 400 MG tablet   Commonly known as: RENAGEL   Take 400 mg by mouth daily. With a meal      sodium chloride 0.9 % SOLN 100 mL with vancomycin 500 MG SOLR 500 mg   Inject 500 mg into the vein Every Tuesday,Thursday,and Saturday with dialysis.      sulfamethoxazole-trimethoprim 800-160 MG per tablet   Commonly known as: BACTRIM DS,SEPTRA DS   Take 1 tablet by mouth once.   Start taking on: 06/08/2012          Disposition and follow-up:   [name] was discharged from [facility] in stable condition.  At the hospital follow up visit please address:   1) follow up blood, body fluid cultures  2) Make sure patient went for CT scan abdomen/pelvis ordered for 06/10/12 prior to ID follow up.  Patient was explained to have this done in 2 weeks though she is hesitates to have imaging at all though recommended.  3) repeat CBC   Follow-up Appointments:  Discharge Orders    Future Appointments: Provider: Department: Dept Phone: Center:    06/20/2012 2:00 PM Randall Hiss, MD [facility] [phone] [facility]     Future Orders Please Complete By Expires   DME Other see comment      Comments:   1. Tub/shower bench 2. Toilet rise with handles 3. Wheelchair  4. Wheelchair cushion (rollator)   DME Other see comment      Comments:   1. Wheelchair (measurements OT) 2. Wheelchair cushion (measurements OT) (rollator)   CT Abdomen Pelvis W Contrast  06/10/12 07/15/12   Questions: Responses:   Preferred imaging location? [facility]   Reason for exam: serial exam for abdominal pain after antibiotics   Increase activity slowly      Discharge instructions      Comments:   1) Please pick up your prescriptions at the pharmacy (Nitroglycerin, Flagyl).  Bactrim start taking 06/08/12 until discontinued.   2) Please follow up with Infectious Disease [phone] in 06/2012.  They will call you with an appointment.  Thanks 3) Please follow up with your regular doctor [phone].  They will call you on Monday 4) Please get in contact with the pain clinic through the [place] services 5) you will need to take antibiotics with dialysis for 5 more dialysis sessions  6) Please get a CT scan abdomen in 2 weeks 06/10/12 [facility] 7) Dr. Earle Gell (Vascular Surgery) will call you with an appt [phone]   Home Health      Questions: Responses:   To provide the following care/treatments PT    OT    RN    Home Health Aide   Face-to-face encounter      Comments:   Henry Russel N certify that this patient is under my care and that I, or a nurse practitioner or physician's assistant working with me, had a face-to-face encounter that meets the physician face-to-face encounter requirements with this patient on 05/27/2012. The encounter with the patient was in whole, or in part for the following medical condition(s) which is the primary reason for home health care (List medical condition): RESUME HOME  HEALTH ORDERS   Questions: Responses:  The encounter with the patient was in whole, or in part, for the following medical condition, which is the primary reason for home health care multiple medical problems, lives alone, limited mobility w/o assistance   I certify that, based on my findings, the following services are medically necessary home health services Physical therapy    Nursing   My clinical findings support the need for the above services OTHER SEE COMMENTS   Further, I certify that my clinical findings support that this patient is homebound due to: Unable to leave home safely without assistance   To provide the following care/treatments PT    OT    RN    Home Health Aide      Consultations:  1) Hartley Barefoot. Allena Katz, MD-Nephrology 2) Nada Libman, MD-VVS 3) Palliative Clayton Lefort 4) ID-Dr. Daiva Eves 5) Internal Medicine-Dr. Garg/Dr. Dalphine Handing 6) PT/OT, social work and case management 7) GI-Dr. Dulce Sellar  Procedures Performed:  Dg Chest 2 View  05/22/2012  *RADIOLOGY REPORT*  Clinical Data: 61 year old female with chest pain.  CHEST - 2 VIEW  Comparison: 05/05/2012  Findings: Cardiomegaly, CABG changes, coronary stent, left-sided pacemaker / AICD and right central venous catheter with tip overlying the right atrium again noted. Pulmonary vascular congestion is present. Blunting of the left posterior costophrenic angle is unchanged. There is no evidence of focal airspace disease, pulmonary edema, suspicious pulmonary nodule/mass, pleural effusion, or pneumothorax. No acute bony abnormalities are identified.  IMPRESSION: Cardiomegaly with pulmonary vascular congestion.   Original Report Authenticated By: Harmon Pier, M.D.    Ct Abdomen Pelvis W Contrast  05/24/2012  *RADIOLOGY REPORT*  Clinical Data: Lower abdominal pain.  Evaluate the pancreas (history of pancreatitis).  Fever of unknown origin.  CT ABDOMEN AND PELVIS WITH CONTRAST  Technique:  Multidetector CT imaging of the  abdomen and pelvis was performed following the standard protocol during bolus administration of intravenous contrast.  Contrast: OMNIPAQUE IOHEXOL 300 MG/ML  SOLN  Comparison: CT of the abdomen and pelvis 02/23/2012.  Findings:  Lung Bases: Severe cardiomegaly with marked dilatation of the left ventricle.  Atherosclerotic calcifications in the left anterior descending, left circumflex and right coronary arteries.  Pacemaker / AICD leads terminating in the right atrium and right ventricular apex, as well as overlying the lateral wall of the left ventricle (via the coronary sinus).  Small bilateral pleural effusions (right greater than left).  Dependent atelectasis in the lower lobes of the lungs bilaterally.  Abdomen/Pelvis:  Status post cholecystectomy.  There is an unusual pattern of hypoperfusion throughout the central liver which partially normalizes on the delayed phase images.  In segment 3 there is an ill-defined 1.8 cm lesion which is incompletely characterized.  This is similar in size to the prior examination 02/23/2012.  The gallbladder has been removed.  The pancreas is difficult to discretely visualized on either phase of the examination, such that an acute pancreatitis is suspect. Additionally, in the tail of the pancreas extending toward the splenic hilum there is an abnormal ill-defined fluid and gas collection, suspicious for a complex infected pseudocyst.   The adrenal glands are poorly visualized.  Parenchymal thinning is noted the kidneys bilaterally.  Numerous low attenuation lesions are noted in the kidneys bilaterally, likely represents small cysts.  Extensive atherosclerosis throughout the abdominal and pelvic vasculature, without definite aneurysm or dissection.  Large volume of ascites. The appendix is normal.  Numerous colonic diverticula.  Accurate assessment for diverticulitis is not possible on this examination given the large  volume of ascites. Status post hysterectomy.  Ovaries  are not confidently identified and may be surgically absent or atrophic.  Low-lying pelvic floor, suggesting pelvic organ prolapse.  Specifically, there is a large amount of ascites between the rectum and posterior wall of the vagina (i.e., a peritoneocele).  Musculoskeletal: There are no aggressive appearing lytic or blastic lesions noted in the visualized portions of the skeleton.  IMPRESSION:  1.  The pancreas is very difficult to visualize on today's examination, suggesting significant pancreatic inflammation and/or necrosis.  This is most profound in the region of the tail of the pancreas where there is an abnormal fluid and gas collection which extends posterolaterally via the gastrosplenic ligament and partially encircles the spleen.   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 2.  Large volume of ascites. 3.  Unusual hypoperfusion in the central liver is of uncertain etiology and significance.  Correlation with liver function tests may be warranted. 4.  Normal appendix. 5.  Numerous colonic diverticula.  Accurate assessment for diverticulitis is not possible on this examination given the large volume of ascites. 6.  Severe cardiomegaly with marked left ventricular dilatation. 7.  Small bilateral pleural effusions. 8.  Atherosclerosis, including at least three vessel coronary artery disease. 9.  Additional incidental findings, as above, similar to prior studies.  These results were called by telephone on 05/24/2012 at 03:23 p.m. to Dr. Eben Burow, who verbally acknowledged these results.   Original Report Authenticated By: Trudie Reed, M.D.    US Paracentesis  05/25/2012  *RADIOLOGY REPORT*  Clinical Data: Recurrent ascites.  Request has been made for therapeutic and diagnostic paracentesis  ULTRASOUND GUIDED PARACENTESIS  Comparison:  None at this facility  An ultrasound guided paracentesis was thoroughly discussed with the patient and  questions answered.  The benefits, risks, alternatives and complications were also discussed.  The patient understands and wishes to proceed with the procedure.  Written consent was obtained.  Ultrasound was performed to localize and mark an adequate pocket of fluid in the right upper quadrant of the abdomen.  The area was then prepped and draped in the normal sterile fashion.  1% Lidocaine was used for local anesthesia.  Under ultrasound guidance a 19 gauge Yueh catheter was introduced.  Paracentesis was performed.  The catheter was removed and a dressing applied.  Complications:  None immediate  Findings:  A total of approximately 1.5 liters of hazy yellow fluid was removed.  A fluid sample was sent for laboratory analysis.  IMPRESSION: Successful ultrasound guided paracentesis yielding 1.5 liters of ascites.  Read by: Anselm Pancoast, P.A.-C   Original Report Authenticated By: Tacey Ruiz, MD     Admission HPI:  Chief Complaint: Right chest pain, Right arm pain, Abdominal pain   History of Present Illness:  61 y.o PMH gallstone pancreatitis status post cholecystectomy, chronic pancreatitis status resection of portion of pancreas, CAD (s/p CABG), ischemic cardiomyopathy status [employer] pacemaker/AICD with EF 10% in 04/2012, severe global hypokinesis, mild aortic stenosis, moderate to severe mitral regurgitation; ESRD on HD (TThSat) at [address] [place], history of DM, HTN, HLD, anemia, esophageal varices, bacteremia (MRSA, enterobacter), Candida fungemia, history of perforated bowel, fatty liver and GI bleeding.  She presented to the ED today for right sided arm pain that woke her up at 6 am. This has occurred twice previously in the past and does not happen every day. Sensation was pressure like and stabbing in her armpit which radiated  to her right thumb and became throbbing. This pain was also associated with right chest pain without radiation. She tried to rub the painful areas without  relief. Nothing made the pain worse. She called EMS due to concern because she has a previous heart history. Also she tried to locate Nitro pills but was unsuccessful. EMS arrived and gave her 4 Aspirin then Nitroglycerin. These interventions took her pain from a 9/10 to 7/10. She received another Nitroglycerin in the ED which she states helped her pain. ED also later gave her dilaudid 1 mg twice, Zofran 4 mg x 1, Phenergan 12.5 mg x 1. Cardiac enzymes were trended x 2 and negative. ROS: +100 lb weight loss in 1 year, denies appetite changes, +chills, denies fever, denies diaphoresis, +nausea (not relieved by Zofran only Phenergan), denies vomiting, denies constipation or diarrhea, denies dysuria, denies cough and sob, denies sob with exertion, denies wounds to skin, +gait problems (uses walker or cane), +history of falls due to imbalance, +left arm swelling since graft placement but no change in size (increasing size and she saw a doctor Thursdays prior to admission), +clostrophobia. She was given Aspirin 324 mg and Nitroglycerin x 2. Her pain decreased from a 9 to a 6. Of note, she had similar symptoms 3 weeks ago. She had a cath 04/2012 which showed ischemic cardiomyopathy with dilated left ventricle and EF on 10%. She follow with [facility]. She also follows with the [facility] hospital and at [facility].  She has had abdominal pain since 2003. Within the last year her abdominal pain is worsening. She has a history of gallstones and a stone lodged in a duct in the past and she had pancreatitis and necrosis. Per the patient she has had 90% of her pancreas removed and only has 10% left. She calls this pain "Joe". Pain today is 8/10 bilateral lower abdomen. She states it is stabbing, constant. Her home pain medications help a little. She is normally able to tolerate oral intake but has felt nauseated with eating today. Lipase was 9 today. She refused CT scan in the ED initially today.  She has decreased  activities of daily living at home and has been dropped by multiple home health agencies due to multiple admissions over the last year and she is not at home but home health is coming to her home tomorrow for RN, PT, she did not mention OT.  [facility] [facility] [address] [place] [facility]  Review of Systems:  General: +100 lb weight loss in 1 year, denies appetite changes, +chills, denies fever, denies diaphoresis  CV: +right chest pain  GI/GU: +nausea (not relieved by Zofran only Phenergan), denies vomiting, denies constipation or diarrhea, denies dysuria  Lungs: denies cough and sob, denies sob with exertion  Skin: denies wounds to skin  Neuro: +gait problems (uses walker or cane), +history of falls due to imbalance  Extremities: +left arm swelling since graft placement but no change in size (increasing size and she saw a doctor Thursdays prior to admission).  Psychiatry: +clostrophobia  Physical Exam:  Vitals on exam 69/110/51 (66); 97%; 16 (Per patient baseline blood pressure is 100s/60s)  Blood pressure 95/53, pulse 72, temperature 98 F (36.7 C), temperature source Oral, resp. rate 20, SpO2 100.00%.  General:cachetic, pleasant, nad, alert and oriented x 3, talkative  HEENT: [place]/at, perrl b/l, no teeth  CV:RRR no rubs, murmurs or gallops, ICD intact left chest, linear scar from chest to abdomen s/p CABG, right chest with HD cath intact no  drainage or evidence of infection.  Lungs:ctab  Abdomen: scar to left lower abdomen from previous peritoneal dialysis catheter, linear scar to abdomen s/p hysterectomy, soft, normal bs, nondistended, mild ttp periumbilically and lower abdomen b/l  Extremities:1+ edema b/l extremities, 2+ DP/PT pulses, warm, dry no cyanosis or edema; left arm edematous  Neuro: CN 2-12 grossly intact, moving all 4 extremities  Skin: no evidence of breakdown  Hospital Course by problem list: 1. Likely Typical angina with chest pain resolved 61 y.o female extensive past medical  history and significant cardiac history (diffuse coronary artery disease and status post CABG) presents for resolved chest pain which was probably typical angina after work up with cardiac enzymes was negative x 3. Pain responded to nitroglycerin x 2 tablets.  She experienced chest pressure radiating down her right arm.  She did not have any acute ST changes.   Previous catherization with LAD with 70% diffuse proximal narrowing prior to patent stent; intermediate vessels 50% proximal stenosis follow by 60% stenosis at bifurcations. Circumflex 80% proximal AV grove stenosis and 80% stenosis in marginal vessel; RCA diffuse 80% prox stenosis as well as ostial 60 and 90% stenosis in anterior RV marginal branches  We refilled Nitroglycerin for discharge as she could not find it at home and she is to continue her Ranexa and Imdur for medical management.  She is also to continue her Coreg and Aspirin.  If arm radiating pain continues in the future consider possible cervical radiculopathy or rotator cuff tendonitis or tear in which imaging of her neck/shoulder may be helpful in the future.   2. Abdominal pain secondary to concerning for complex infected pseudoocyst with pancreatic necrosis  She has chronic abdominal pain likely secondary to a history of chronic pancreatitis (since 2003) due to gallstones status post cholecystectomy 2003, which led to complications such as pseudocyst and necrosis.  Lipase was 9 this admission.  She was found to have leukocytosis and spiked a Tmax of 100.5 this admission.  She calls this abdominal pain "Joe" as it references her pancreas pain.  It took a few days to convince her to have a CT abdomen pelvis with contrast as she needs a cushion to be comfortable in CT.   Likely secondary to pancreatic inflammation and/necrosis noted on CT.  Most profound in the region of the tail of the pancreas where there is abnormal fluid and gas extending posterolaterally via the gastrosplenic ligament  and partially encircles the spleen. 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 This will be a perpetual problem as the patient does not want surgical intervention i.e drainage of infected pseudocyst which is the treatment of choice. Patient has an EF of 10% and was thought not to be a surgical candidate in the past for this. She will continue to have leukocytosis, fever, increased risk for SIRS/sepsis in the future with multiple admission for this. Pain will continue to possibly be an issue. 05/24/12 US paracentesis removed 1.5 L strawberry colored with cloudy appearance, 81 monocytes macrophages, 0.7 Albumin, Amylase 12, TP 2.0, WBC 132, lymphs 7, Eos 1, 11 Neutrophils. Blood and abdominal fluid cultures with no growth to date and still pending but the patient has been on antibiotics for 4 days. Per Up To Date the presence of necrosis serves as a detterant for surgical intervention for pseudocyst drainage and the patient is a poor surgical candidate for removal of the pancreatic necrosis.  She will likely continue to have abdominal pain though the pain lessened after adjustments  of her pain medications by palliative care.  Titrated up her Dilaudid 2 mg tid oral with 1 mg q3 hours i.v.  Outpatient she will continue Dilaudid 4 mg q4 orally, Dilaudid 1 mg q2 as needed orally, Methadone 5 mg tid.   She is follow up with trying to get set up with outpatient pain center affiliated with the [facility] clinic in [place].  Outpatient per ID recommendations we will continue Vancomycin (3 days total given this admission) Zosyn x 5 days (given this admission). Will continue Vancomycin for 5 more doses with HD TThSat ending 12/24 and change Zosyn to [facility] 2 g with HD TThSat ending 12/24. Added Flagyl 500 mg tid to continue for 4 more days without refills.  Gastroenterology stated for patient to follow up at [facility] in the future and no further follow up at this time.    3. Ascites  Status post  multiple prior paracenteses at [facility] every 2 weeks per the patient.  She had one this admission removing 1.5 L fluid.  Etiology can be from pancreas (less likely as amylase was 12) or liver (patient does have fatty liver). She has borderline liver failure with a Child Pugh score of 9 which indicates class B.  She was on Cipro for spontaneous bacterial peritonitis prophylaxis in the past possibly.  To reduce her chace of C. Difficile we  will treat with Bactrim DS 1 tablet daily until discontinued for SBP prophylaxis per Dr. Daiva Eves.    4. LUE edema  Vascular surgery was consulted this admission (Dr. Earle Gell) for SCV stenosis/occulsion in region of ICD status post graft on left upper arm. They will remove graft in the future, not urgent.  The patient will be advised to follow up outpatient and their clinic will call her with an appointment.    5. ESRD on HD TThSat  Consulted [facility] to resume normal HD schedule TThSat.  She is no anuric.  She had elevated alkaline phosphatase with normal GGT this admission likely related to metabolic bone disease.  She also has normocytic anemia likely related to anemia of chronic disease.    6 and 7. Resolved fever and leukocytosis The patient has a history of Methacillin Resistant Staph aureus, Enterobacter Cloacae, and Candida Parapsilosis in blood cultures in 09/2011.  At that time she had her peritoneal dialysis catheter pulled and now has a right chest catheter.  We initially thought this could be the source of infection but blood cultures are negative to date and this is her only access.  Her leukocytosis responded initially to treatment with Vancomycin and Zosyn.  Urine culture had no growth this admission.  8. DVT Px  -Lovenox   The patient does have a current PCP (ARMOUR, ROSS B, MD), therefore will be requiring OPC follow-up after discharge. The patient does not have transportation limitations that hinder transportation to clinic appointments.   Her step-daughter [name] transports her.     Discharge Vitals:  BP 117/66  Pulse 68  Temp 98.4 F (36.9 C) (Oral)  Resp 18  Ht 5\' 4"  (1.626 m)  Wt 128 lb (58.06 kg)  BMI 21.97 kg/m2  SpO2 97%  Discharge physical exam General: pleasant, very talkative, alert and oriented x 3  HEENT:[place]/at  CV: RRR no rubs, murmurs or gallops, right HD cath intact  Lungs: ctab  Abdomen: mild ttp left mid quadrants; all other quadrants nt, nd, +normal bs  Extremities: warm, no cyanosis or edema except left upper extremity swollen  s/p graft placement  Neuro: CN 2-12 grossly intact, moving all 4 extremities   Discharge Labs:  Results for orders placed during the hospital encounter of 05/22/12 (from the past 24 hour(s))  GLUCOSE, CAPILLARY     Status: Abnormal   Collection Time   05/26/12  9:08 PM      Component Value Range   Glucose-Capillary 102 (*) 70 - 99 mg/dL  GLUCOSE, CAPILLARY     Status: Normal   Collection Time   05/27/12  5:29 AM      Component Value Range   Glucose-Capillary 76  70 - 99 mg/dL  GLUCOSE, CAPILLARY     Status: Abnormal   Collection Time   05/27/12 11:22 AM      Component Value Range   Glucose-Capillary 184 (*) 70 - 99 mg/dL   Results for [name], [name] (MRN [id number]) as of 05/27/2012 15:41  Ref. Range 05/26/2012 12:15  Prothrombin Time Latest Range: 11.6-15.2 seconds 15.8 (H)  INR Latest Range: 0.00-1.49  1.29   Results for [name], [name] (MRN [id number]) as of 05/27/2012 15:41  Ref. Range 05/22/2012 07:09  WBC Latest Range: 4.0-10.5 K/uL 20.8 (H)  RBC Latest Range: 3.87-5.11 MIL/uL 3.52 (L)  Hemoglobin Latest Range: 12.0-15.0 g/dL 9.7 (L)  HCT Latest Range: 36.0-46.0 % 31.3 (L)  MCV Latest Range: 78.0-100.0 fL 88.9  MCH Latest Range: 26.0-34.0 pg 27.6  MCHC Latest Range: 30.0-36.0 g/dL 08.6  RDW Latest Range: 11.5-15.5 % 17.3 (H)  Platelets Latest Range: 150-400 K/uL 169  Neutrophils Relative Latest Range: 43-77 % 88 (H)  Lymphocytes Relative Latest  Range: 12-46 % 5 (L)  Monocytes Relative Latest Range: 3-12 % 6  Eosinophils Relative Latest Range: 0-5 % 0  Basophils Relative Latest Range: 0-1 % 0  NEUT# Latest Range: 1.7-7.7 K/uL 18.3 (H)  Lymphocytes Absolute Latest Range: 0.7-4.0 K/uL 1.1  Monocytes Absolute Latest Range: 0.1-1.0 K/uL 1.3 (H)  Eosinophils Absolute Latest Range: 0.0-0.5 10e3/uL 0.0  Basophils Absolute Latest Range: 0.0-0.1 K/uL 0.0   Results for [name], [name] (MRN [id number]) as of 05/27/2012 15:41  Ref. Range 05/23/2012 07:15 05/24/2012 05:10 05/24/2012 08:09 05/25/2012 07:25 05/26/2012 07:40  WBC Latest Range: 4.0-10.5 K/uL 13.1 (H) 10.6 (H) 10.7 (H) 9.8 8.9  RBC Latest Range: 3.87-5.11 MIL/uL 3.97 3.30 (L) 3.66 (L) 3.57 (L) 3.51 (L)  Hemoglobin Latest Range: 12.0-15.0 g/dL 52.8 (L) 9.4 (L) 41.3 (L) 9.9 (L) 9.5 (L)  HCT Latest Range: 36.0-46.0 % 35.6 (L) 29.2 (L) 32.2 (L) 31.7 (L) 31.9 (L)  MCV Latest Range: 78.0-100.0 fL 89.7 88.5 88.0 88.8 90.9  MCH Latest Range: 26.0-34.0 pg 27.5 28.5 27.3 27.7 27.1  MCHC Latest Range: 30.0-36.0 g/dL 24.4 01.0 27.2 53.6 64.4 (L)  RDW Latest Range: 11.5-15.5 % 17.4 (H) 17.2 (H) 17.2 (H) 17.2 (H) 17.1 (H)  Platelets Latest Range: 150-400 K/uL 172 167 176 175 186  Neutrophils Relative Latest Range: 43-77 % 82 (H) 83 (H)  81 (H)   Lymphocytes Relative Latest Range: 12-46 % 11 (L) 9 (L)  10 (L)   Monocytes Relative Latest Range: 3-12 % 6 7  8    Eosinophils Relative Latest Range: 0-5 % 1 1  1    Basophils Relative Latest Range: 0-1 % 0 0  0   NEUT# Latest Range: 1.7-7.7 K/uL 10.7 (H) 8.7 (H)  7.9 (H)   Lymphocytes Absolute Latest Range: 0.7-4.0 K/uL 1.5 0.9  0.9   Monocytes Absolute Latest Range: 0.1-1.0 K/uL 0.8 0.8  0.8   Eosinophils Absolute Latest  Range: 0.0-0.5 10e3/uL 0.1 0.1  0.1   Basophils Absolute Latest Range: 0.0-0.1 K/uL 0.0 0.0  0.0     Results for ANNAROSE, OUELLET (MRN [id number]) as of 05/26/2012 16:37  Ref. Range 05/22/2012 16:08 05/23/2012 07:15 05/24/2012 08:09  05/25/2012 07:25 05/26/2012 06:15  Sodium Latest Range: 135-145 mEq/L  133 (L) 129 (L) 135 133 (L)  Potassium Latest Range: 3.5-5.1 mEq/L  3.9 4.4 3.6 4.7  Chloride Latest Range: 96-112 mEq/L  94 (L) 90 (L) 98 95 (L)  CO2 Latest Range: 19-32 mEq/L  27 26 29 26   BUN Latest Range: 6-23 mg/dL  39 (H) 52 (H) 21 30 (H)  Creatinine Latest Range: 0.50-1.10 mg/dL  4.09 (H) 8.11 (H) 9.14 (H) 3.30 (H)  Calcium Latest Range: 8.4-10.5 mg/dL  78.2 (H) 95.6 (H) 9.9 11.1 (H)  GFR calc non Af Amer Latest Range: >90 mL/min  10 (L) 9 (L) 18 (L) 14 (L)  GFR calc Af Amer Latest Range: >90 mL/min  11 (L) 10 (L) 21 (L) 16 (L)  Glucose Latest Range: 70-99 mg/dL  213 (H) 086 (H) 578 (H) 88  Phosphorus Latest Range: 2.3-4.6 mg/dL   3.8  4.0  Alkaline Phosphatase Latest Range: 39-117 U/L  172 (H)  126 (H)   Albumin Latest Range: 3.5-5.2 g/dL  2.0 (L) 1.8 (L) 1.8 (L) 1.8 (L)  AST Latest Range: 0-37 U/L  22  14   ALT Latest Range: 0-35 U/L  17  11   Total Protein Latest Range: 6.0-8.3 g/dL  6.8  6.5   Total Bilirubin Latest Range: 0.3-1.2 mg/dL  0.3  0.3   GGT Latest Range: 7-51 U/L  40     Troponin I Latest Range: <0.30 ng/mL <0.30       Results for [name], [name] (MRN [id number]) as of 05/26/2012 16:37  Ref. Range 05/22/2012 07:09 05/22/2012 11:42 05/22/2012 12:46  Sodium Latest Range: 135-145 mEq/L 135    Potassium Latest Range: 3.5-5.1 mEq/L 3.3 (L)    Chloride Latest Range: 96-112 mEq/L 96    CO2 Latest Range: 19-32 mEq/L 30    BUN Latest Range: 6-23 mg/dL 28 (H)    Creatinine Latest Range: 0.50-1.10 mg/dL 4.13 (H)    Calcium Latest Range: 8.4-10.5 mg/dL 24.4    GFR calc non Af Amer Latest Range: >90 mL/min 12 (L)    GFR calc Af Amer Latest Range: >90 mL/min 14 (L)    Glucose Latest Range: 70-99 mg/dL 010 (H)    Alkaline Phosphatase Latest Range: 39-117 U/L   144 (H)  Albumin Latest Range: 3.5-5.2 g/dL   1.9 (L)  Lipase Latest Range: 11-59 U/L   9 (L)  AST Latest Range: 0-37 U/L   35  ALT Latest Range:  0-35 U/L   19  Total Protein Latest Range: 6.0-8.3 g/dL   6.4  Bilirubin, Direct Latest Range: 0.0-0.3 mg/dL   <2.7  Indirect Bilirubin Latest Range: 0.3-0.9 mg/dL   NOT CALCULATED  Total Bilirubin Latest Range: 0.3-1.2 mg/dL   0.3  Troponin I Latest Range: <0.30 ng/mL <0.30    Troponin i, poc Latest Range: 0.00-0.08 ng/mL  0.03    Results for [name], [name] (MRN [id number]) as of 05/26/2012 16:37  Ref. Range 05/22/2012 07:09 05/23/2012 07:15 05/24/2012 05:10 05/24/2012 08:09 05/25/2012 07:25 05/26/2012 07:40  WBC Latest Range: 4.0-10.5 K/uL 20.8 (H) 13.1 (H) 10.6 (H) 10.7 (H) 9.8 8.9  RBC Latest Range: 3.87-5.11 MIL/uL 3.52 (L) 3.97 3.30 (L) 3.66 (L) 3.57 (L) 3.51 (L)  Hemoglobin Latest Range: 12.0-15.0 g/dL 9.7 (L) 40.9 (L) 9.4 (L) 10.0 (L) 9.9 (L) 9.5 (L)  HCT Latest Range: 36.0-46.0 % 31.3 (L) 35.6 (L) 29.2 (L) 32.2 (L) 31.7 (L) 31.9 (L)  MCV Latest Range: 78.0-100.0 fL 88.9 89.7 88.5 88.0 88.8 90.9  MCH Latest Range: 26.0-34.0 pg 27.6 27.5 28.5 27.3 27.7 27.1  MCHC Latest Range: 30.0-36.0 g/dL 81.1 91.4 78.2 95.6 21.3 29.8 (L)  RDW Latest Range: 11.5-15.5 % 17.3 (H) 17.4 (H) 17.2 (H) 17.2 (H) 17.2 (H) 17.1 (H)  Platelets Latest Range: 150-400 K/uL 169 172 167 176 175 186  Neutrophils Relative Latest Range: 43-77 % 88 (H) 82 (H) 83 (H)  81 (H)   Lymphocytes Relative Latest Range: 12-46 % 5 (L) 11 (L) 9 (L)  10 (L)   Monocytes Relative Latest Range: 3-12 % 6 6 7  8    Eosinophils Relative Latest Range: 0-5 % 0 1 1  1    Basophils Relative Latest Range: 0-1 % 0 0 0  0   NEUT# Latest Range: 1.7-7.7 K/uL 18.3 (H) 10.7 (H) 8.7 (H)  7.9 (H)   Lymphocytes Absolute Latest Range: 0.7-4.0 K/uL 1.1 1.5 0.9  0.9   Monocytes Absolute Latest Range: 0.1-1.0 K/uL 1.3 (H) 0.8 0.8  0.8   Eosinophils Absolute Latest Range: 0.0-0.5 10e3/uL 0.0 0.1 0.1  0.1   Basophils Absolute Latest Range: 0.0-0.1 K/uL 0.0 0.0 0.0  0.0   Results for [name], [name] (MRN [id number]) as of 05/26/2012 16:37  Ref. Range  05/26/2012 12:15  Prothrombin Time Latest Range: 11.6-15.2 seconds 15.8 (H)  INR Latest Range: 0.00-1.49  1.29   Results for [name], [name] (MRN [id number]) as of 05/26/2012 16:37  Ref. Range 05/22/2012 21:26  Color, Urine Latest Range: YELLOW  YELLOW  APPearance Latest Range: CLEAR  CLOUDY (A)  Specific Gravity, Urine Latest Range: 1.005-1.030  1.017  pH Latest Range: 5.0-8.0  8.5 (H)  Glucose Latest Range: NEGATIVE mg/dL NEGATIVE  Bilirubin Urine Latest Range: NEGATIVE  NEGATIVE  Ketones, ur Latest Range: NEGATIVE mg/dL NEGATIVE  Protein Latest Range: NEGATIVE mg/dL 244 (A)  Urobilinogen, UA Latest Range: 0.0-1.0 mg/dL 0.2  Nitrite Latest Range: NEGATIVE  NEGATIVE  Leukocytes, UA Latest Range: NEGATIVE  SMALL (A)  Hgb urine dipstick Latest Range: NEGATIVE  NEGATIVE  Urine-Other No range found FEW YEAST  WBC, UA Latest Range: <3 WBC/hpf 11-20  RBC / HPF Latest Range: <3 RBC/hpf 0-2  Squamous Epithelial / LPF Latest Range: RARE  MANY (A)  Bacteria, UA Latest Range: RARE  MANY (A)    Results for [name], [name] (MRN [id number]) as of 05/26/2012 16:37  Ref. Range 05/25/2012 13:02  Monocyte-Macrophage-Serous Fluid Latest Range: 50-90 % 81  Albumin, Fluid No range found 0.7  Fluid Type-FALB No range found FLUID  Amylase, Fluid No range found 12  Fluid Type-FAMY No range found FLUID  Total protein, fluid No range found 2.0  Fluid Type-FCT No range found FLUID  Fluid Type-FTP No range found FLUID  Color, Fluid Latest Range: YELLOW  STRAW (A)  WBC, Fluid Latest Range: 0-1000 cu mm 132  Lymphs, Fluid No range found 7  Eos, Fluid No range found 1  Appearance, Fluid Latest Range: CLEAR  CLOUDY (A)  Neutrophil Count, Fluid Latest Range: 0-25 % 11   Signed: Annett Gula 05/27/2012, 4:34 PM   Time Spent on Discharge: 70 minutes  Services Ordered on Discharge: [facility] (RN, home aid, PT, OT) Equipment Ordered on Discharge: tub/shower bench, toilet rise  with handles, wheelchair and  cushions (rollator)

## 2012-05-26 NOTE — Progress Notes (Signed)
I have personally seen and examined this patient and agree with the assessment/plan as outlined above by [facility]. Suspect that PD fluid culture will be negative given antibiotic therapy that was empirically started. She informs me she on was on chronic/supressive ciprofloxacin that was stopped 3 weeks ago during an admission. This should likely be restarted as her nidus of infection (necrotic pancreas) remains   Dwayne Begay K.,MD 05/26/2012 10:53 AM

## 2012-05-26 NOTE — Progress Notes (Signed)
Patient ZO:XWRUEAV [name]      DOB: 1951-04-05      WUJ:[id number]  Palliative Care met with patient today to discuss Goals of Care and evaluate pain management. Patient alert/oriented, we talked about wishes pertinent to healthcare. Her son [name] is designated HPOA, she stated he is aware of her wishes, did not feel it necessary to have discussion with him at this time. Her goals presently are as follows:   Code Status: DNR/DNI  Wants to come back to hospital for evaluation and treatment of any acute issues, would want all medical intervention and monitoring.  Wants to continue antibiotic therapy for identified infections  Would not want artificial tube feedings if she were unable to eat and drink orally  Pain Management:  Patient began having issues with pancreas due to cholelithiasis back in 2003, she developed chronic pancreatitis s/p cholecystectomy and required surgery at [facility] earlier this year where a portion of pancreas was removed.  Pain was managed with Vicodin prior to surgery, but patient developed fatty liver and Vicodin was stopped. She has severe allergic reaction with Morphine use, Fentanyl patch tried increased to 106mcg/72 hours, per patient was ineffective.   Methadone/Diludid regimes patient is currently on was initiated via palliative care service at [facility] during hospitalization last April. Patient was discharged with home hospice support, was receiving prescriptions for Diluadid from [place] PCP Dr Armour in [facility] sometimes, but he refused to write for Methadone. Patient stated she received refills from hospice or when hospitalized here she would get refills. Does not have consistent physician in community to receive refills for narcotics.  Discussed case with Dr Derenda Mis:  Dilaudid IV/Oral dosing/24 hours = Oral Morphine 102mg   Plan:   Continue Methadone 5 mg three times/day 10:00a-4:00p-10:00a  Increase Hydromorphone to 4 mg oral every  4 hours: 9:00A-1:00p-5:00p-9:00p-1:00a-5:00a  Discontinue IV as needed Dilaudid  Ordered Hydromorphone 1 mg oral every 2 hours as needed  Will call tomorrow to see about referral for patient to outpatient community pain clinic. Spoke with Dr Desma Maxim MD [phone]) about orders for pain medication placed and plan for referral   Freddie Breech, CNS-C Palliative Medicine Team [facility] Team Phone: [phone] Pager: [phone]

## 2012-05-26 NOTE — Progress Notes (Signed)
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  Patient's chart reviewed.  She has history of gallstone pancreatitis in 2003 which seems to have been complicated by necrosis and pseudocyst formation.  Was admitted for abdominal pain, fevers, leukocytosis, among other troubles.  She has chronic abdominal pain requiring chronic narcotics.  She recently developed ascites and has been receiving periodic paracenteses at [facility].  Recent CT showed ascites as well as complicated cystic structure along tail of pancreas with concern for infected pseudocyst.  Other radiographic features are suggestive of pancreatic necrosis.  Patient has seen infectious disease and appears to be improving with broad-spectrum antibiotics.  Patient tells me surgery has been discussed in past, but she is felt to be too high risk.  O: GEN:  Chronically ill-appearing but is in NAD ABD:  Mild distention, non-tender  Labs. CBC    Component Value Date/Time   WBC 8.9 05/26/2012 0740   WBC 6.7 04/08/2011 1054   RBC 3.51* 05/26/2012 0740   RBC 3.40* 04/08/2011 1054   HGB 9.5* 05/26/2012 0740   HGB 10.1* 04/08/2011 1054   HCT 31.9* 05/26/2012 0740   HCT 30.1* 04/08/2011 1054   PLT 186 05/26/2012 0740   PLT 232 04/08/2011 1054   MCV 90.9 05/26/2012 0740   MCV 88.6 04/08/2011 1054   MCH 27.1 05/26/2012 0740   MCH 29.7 04/08/2011 1054   MCHC 29.8* 05/26/2012 0740   MCHC 33.5 04/08/2011 1054   RDW 17.1* 05/26/2012 0740   RDW 20.4* 04/08/2011 1054   LYMPHSABS 0.9 05/25/2012 0725   LYMPHSABS 1.4 04/08/2011 1054   MONOABS 0.8 05/25/2012 0725   MONOABS 0.4 04/08/2011 1054   EOSABS 0.1 05/25/2012 0725   EOSABS 0.2 04/08/2011 1054   BASOSABS 0.0 05/25/2012 0725   BASOSABS 0.0 04/08/2011 1054    A:  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  Patient's problems have been intermittently ongoing for nearly 10 years, though development of ascites is new over the past few months.  P: 1.  Continue  antibiotics under care of ID team. 2.  Pancreatic enzymes (50K units of lipase with meals, 25K units lipase with snacks). 3.  Would check amylase level of next paracentesis specimen, to ensure this is pancreatic ascites, and, if it is, she might benefit from trial of outpatient octreotide. 4.  Patient has been seen here in April 2013 by Dr. [name] during a previous hospitalization, and she is in process of making follow-up arrangements with him upon discharge.  I also feel, as it appears Dr. [name] has as well in the past, that Mrs. Swartzendruber would be best served by tertiary center management [facility]) for her pancreatic cyst, necrosis (?infected) and ascites (?pancreatic origin). 5.  Will sign-off; please call with questions; thank you for the consult.

## 2012-05-26 NOTE — Progress Notes (Signed)
[facility]    Days of  5 antibiotics:  Day # 5 of vancomycin  Day # 5of zosyn   Subjective:  Feels better   Antibiotics:  Anti-infectives     Start     Dose/Rate Route Frequency Ordered Stop   05/24/12 1200   vancomycin (VANCOCIN) 750 mg in sodium chloride 0.9 % 150 mL IVPB  Status:  Discontinued        750 mg 150 mL/hr over 60 Minutes Intravenous Every T-Th-Sa (Hemodialysis) 05/22/12 2215 05/23/12 1348   05/24/12 1200   vancomycin (VANCOCIN) 500 mg in sodium chloride 0.9 % 100 mL IVPB        500 mg 100 mL/hr over 60 Minutes Intravenous Every T-Th-Sa (Hemodialysis) 05/23/12 1348     05/23/12 0000   vancomycin (VANCOCIN) 1,250 mg in sodium chloride 0.9 % 250 mL IVPB        1,250 mg 166.7 mL/hr over 90 Minutes Intravenous  Once 05/22/12 2213 05/23/12 0309   05/23/12 0000  piperacillin-tazobactam (ZOSYN) IVPB 2.25 g       2.25 g 100 mL/hr over 30 Minutes Intravenous 3 times per day 05/22/12 2214            Medications: Scheduled Meds:    . aspirin EC  81 mg Oral Daily  . carvedilol  6.25 mg Oral BID WC  . darbepoetin (ARANESP) injection - DIALYSIS  100 mcg Intravenous Q Thu-HD  . enoxaparin (LOVENOX) injection  30 mg Subcutaneous Q24H  . feeding supplement (NEPRO CARB STEADY)  237 mL Oral BID BM  . hydrALAZINE  25 mg Oral Q8H  . HYDROmorphone  2 mg Oral TID  . isosorbide mononitrate  30 mg Oral Daily  . methadone  5 mg Oral TID  . multivitamin  1 tablet Oral Daily  . pantoprazole  40 mg Oral Daily  . piperacillin-tazobactam (ZOSYN)  IV  2.25 g Intravenous Q8H  . ranolazine  500 mg Oral Daily  . sevelamer  400 mg Oral Q breakfast  . simvastatin  10 mg Oral q1800  . sodium chloride  3 mL Intravenous Q12H  . vancomycin  500 mg Intravenous Q T,Th,Sa-HD   Continuous Infusions:  PRN Meds:.camphor-menthol, docusate sodium, HYDROmorphone (DILAUDID) injection, hydrOXYzine, nitroGLYCERIN, ondansetron (ZOFRAN) IV, ondansetron,  sorbitol, zolpidem   Objective: Weight change: -3 lb 4.9 oz (-1.5 kg)  Intake/Output Summary (Last 24 hours) at 05/26/12 1747 Last data filed at 05/26/12 1449  Gross per 24 hour  Intake    930 ml  Output   3000 ml  Net  -2070 ml   Blood pressure 111/63, pulse 91, temperature 98.3 F (36.8 C), temperature source Oral, resp. rate 20, height 5\' 4"  (1.626 m), weight 131 lb 13.4 oz (59.8 kg), SpO2 97.00%. Temp:  [98.1 F (36.7 C)-98.9 F (37.2 C)] 98.3 F (36.8 C) (12/12 1449) Pulse Rate:  [63-91] 91  (12/12 1449) Resp:  [16-20] 20  (12/12 1449) BP: (105-135)/(63-80) 111/63 mmHg (12/12 1449) SpO2:  [96 %-98 %] 97 % (12/12 1449) Weight:  [130 lb 1.1 oz (59 kg)-131 lb 13.4 oz (59.8 kg)] 131 lb 13.4 oz (59.8 kg) (12/12 0715)  Physical Exam: General: Alert and awake, oriented x3, not in any acute distress.  HEENT: anicteric sclera, pupils reactive to light and accommodation, EOMI, oropharynx clear and without exudate  CVS regular rate, normal r, no murmur rubs or gallops  Chest: clear to auscultation bilaterally, no wheezing, rales or rhonchi  Abdomen:  Tender to palpation throughout with distention. Difficult to percuss her abdomen due to her exquisite tenderness to palpation. Not clear that she has rebound tenderness,  Extremities left upper arm is grossly edematous there is a thrill audible over her graft site. No obvious purulence arm is tender.  Skin: HD catheter site is without purulence  Neuro: nonfocal, strength and sensation intact   Lab Results:  Basename 05/26/12 0740 05/25/12 0725  WBC 8.9 9.8  HGB 9.5* 9.9*  HCT 31.9* 31.7*  PLT 186 175    BMET  Basename 05/26/12 0615 05/25/12 0725  NA 133* 135  K 4.7 3.6  CL 95* 98  CO2 26 29  GLUCOSE 88 132*  BUN 30* 21  CREATININE 3.30* 2.66*  CALCIUM 11.1* 9.9    Micro Results: Recent Results (from the past 240 hour(s))  URINE CULTURE     Status: Normal   Collection Time   05/22/12  9:26 PM      Component Value  Range Status Comment   Specimen Description URINE, RANDOM   Final    Special Requests ADDED 2220   Final    Culture  Setup Time 05/22/2012 22:33   Final    Colony Count NO GROWTH   Final    Culture NO GROWTH   Final    Report Status 05/23/2012 FINAL   Final   CULTURE, BLOOD (ROUTINE X 2)     Status: Normal (Preliminary result)   Collection Time   05/22/12 11:20 PM      Component Value Range Status Comment   Specimen Description BLOOD RIGHT ARM   Final    Special Requests BOTTLES DRAWN AEROBIC AND ANAEROBIC 10CC EA   Final    Culture  Setup Time 05/23/2012 09:25   Final    Culture     Final    Value:        BLOOD CULTURE RECEIVED NO GROWTH TO DATE CULTURE WILL BE HELD FOR 5 DAYS BEFORE ISSUING A FINAL NEGATIVE REPORT   Report Status PENDING   Incomplete   CULTURE, BLOOD (ROUTINE X 2)     Status: Normal (Preliminary result)   Collection Time   05/22/12 11:54 PM      Component Value Range Status Comment   Specimen Description BLOOD RIGHT HAND   Final    Special Requests BOTTLES DRAWN AEROBIC AND ANAEROBIC 10CC EA   Final    Culture  Setup Time 05/23/2012 09:25   Final    Culture     Final    Value:        BLOOD CULTURE RECEIVED NO GROWTH TO DATE CULTURE WILL BE HELD FOR 5 DAYS BEFORE ISSUING A FINAL NEGATIVE REPORT   Report Status PENDING   Incomplete   SURGICAL PCR SCREEN     Status: Normal   Collection Time   05/23/12  4:20 PM      Component Value Range Status Comment   MRSA, PCR NEGATIVE  NEGATIVE Final    Staphylococcus aureus NEGATIVE  NEGATIVE Final   BODY FLUID CULTURE     Status: Normal (Preliminary result)   Collection Time   05/25/12  1:02 PM      Component Value Range Status Comment   Specimen Description FLUID ASCITIC   Final    Special Requests SYRINGE @ 60CC   Final    Gram Stain     Final    Value: RARE WBC PRESENT, PREDOMINANTLY MONONUCLEAR     NO ORGANISMS SEEN  Culture NO GROWTH   Final    Report Status PENDING   Incomplete   FUNGUS CULTURE W SMEAR     Status:  Normal (Preliminary result)   Collection Time   05/25/12  1:02 PM      Component Value Range Status Comment   Specimen Description FLUID ASCITIC   Final    Special Requests SYRINGE @ 60CC   Final    Fungal Smear NO YEAST OR FUNGAL ELEMENTS SEEN   Final    Culture CULTURE IN PROGRESS FOR FOUR WEEKS   Final    Report Status PENDING   Incomplete     Studies/Results: US Paracentesis  05/25/2012  *RADIOLOGY REPORT*  Clinical Data: Recurrent ascites.  Request has been made for therapeutic and diagnostic paracentesis  ULTRASOUND GUIDED PARACENTESIS  Comparison:  None at this facility  An ultrasound guided paracentesis was thoroughly discussed with the patient and questions answered.  The benefits, risks, alternatives and complications were also discussed.  The patient understands and wishes to proceed with the procedure.  Written consent was obtained.  Ultrasound was performed to localize and mark an adequate pocket of fluid in the right upper quadrant of the abdomen.  The area was then prepped and draped in the normal sterile fashion.  1% Lidocaine was used for local anesthesia.  Under ultrasound guidance a 19 gauge Yueh catheter was introduced.  Paracentesis was performed.  The catheter was removed and a dressing applied.  Complications:  None immediate  Findings:  A total of approximately 1.5 liters of hazy yellow fluid was removed.  A fluid sample was sent for laboratory analysis.  IMPRESSION: Successful ultrasound guided paracentesis yielding 1.5 liters of ascites.  Read by: Anselm Pancoast, P.A.-C   Original Report Authenticated By: Tacey Ruiz, MD       Assessment/Plan: [name] is a 61 y.o. female with  with ESRD, cirrhosis, ischemic cardiomyopathy, prior pancreatectomy, prior polymicrobial bacteremia and peritonitis that developed while she was on peritoneal dialysis and when she had developed a perforated viscus now admitted with chest pain, abdominal pain and fevers. Blood cultures  so far no growth. CT showing signficant pancreatic inflammation and possibly necrosis, massive amount of ascites. 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 The left brachiocephalic vein and SVC are widely patent. It did not appear to be amenable to endovascular intervention due to the presence of left sided pacer wires through the occluded segment." and has been evaluated here by Dr. Myra Gianotti for her LUE edema that is consequence of this  US guided paracentesis with only few hundred wbc predominantly monocytes.   #1 Fevers: Ascitic fluid not  impressive for infection. Perhaps fevers are driven by an  Infected necrotic pancreatic cyst   --I would change the patient to  --Vancomycin and Ceftazidime with HD AND --Flagyl 500mg  po tid and finish additional 5 more days of therapy --follow cultures --I would then place the pt on daily bactrim DS as prophylactic drug for SBP and which may address residual infection in pancrease without being high risk for CDI   #2 Infection Prevention:  This patient should In My opinion be on contact precautions. 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   I will sign off for now. Please call with further questions.   LOS: 4 days  [facility]  [facility]  [phone] (pager)  [phone] (office)   Paulette Blanch Dam 05/26/2012, 5:47 PM

## 2012-05-26 NOTE — Consult Note (Signed)
Patient [name] [name]      DOB: April 15, 1951      FAO:[id number]     Consult Note from the Palliative Medicine Team at [facility]    Consult Requested by:Dr Lars Mage     PCP: Jyl Heinz, MD Reason for Consultation:Pain/Goals of Care     Phone Number:[phone]  Assessment of patients Current state: Patient alert, verbally responsive, oriented and good historian regarding health issues and pain management in the past. Sitting up in bed, states her pain is localized to LLQ of abdomen, aggravated by movement. Currently 8/10, generally subsides to 6/10 which is somewhat tolerable. Describes pain as stabbing/throbbing, states current regime of scheduled Methadone 5mg /Dilaudid 2mg  three times/day (takes both at same time), works but pain increases to 8-10/10 within 3 hours after taking the medications.   Reviewed chart, spoke with staff, and proceeded to have PMT Goals of Care meeting with patient. She states son, [name] is HPOA, lives in [place] and daughter, [name] , lives in [place]. She does have a friend [name] (identifies her as her step-daughter), that drives her to appointments and helps her with ADL's. She lives alone, has a female friend that checks on her daily. She is able to walk short distances, appetite is poor, but she forces herself to eat. She received her initial prescription for methadone/Diluadid regime, from Palliative Care Team at [facility] last April while she was admitted. At discharge she was on home hospice support until 01/2012, but support was revoked when she was admitted to [facility]. She is followed by Dr [name] at the [facility] OPT clinic, and received some refills for the Dilaudid from him, but he was unable supply the Methadone, she stated she received some prescriptions from hospice physician, and then some refills after admission here.   Discussed the philosophy of palliative care and hospice support and services provided. Also engaged  in decision with family regarding concepts of Medical Orders for Scope of Treatment pertaining to cardiac and pulmonary resuscitation, desire for acute future interventions for acute issues, the use of antibiotic therapy, intravenous hydration and continuing artificial tube feedings. [name] refused to fill out MOST form, stated she has been through that a hundred times, and that her son knows what she wants. I did verbally review her wishes and documented them below.   Goals of Care/Scope of Treatment: 1.  Code Status:DNR/DNI Code Status: DNR/DNI  Wants to come back to hospital for evaluation and treatment of any acute issues, would want all medical intervention and monitoring.  Wants to continue antibiotic therapy for identified infections  Would not want artificial tube feedings if she were unable to eat and drink orally   4. Disposition: Patient wants to go back home with home health support   3. Symptom Management:  1. Pain: (Dilaudid IV/Oral dosing/24 hours =Oral Morphine 102mg )   Continue Methadone 5 mg three times/day 10:00a-4:00p-10:00a Increase Hydromorphone to 4 mg oral every 4 hours: 9:00A-1:00p-5:00p-9:00p-1:00a-5:00a  Discontinue IV as needed Dilaudid  Ordered Hydromorphone 1 mg oral every 2 hours as needed  Patient and staff education on change in pain regime, written instruction sheet given to patient with times of scheduled pain medications.  Will call tomorrow to see about referral for patient to be followed at community pain clinic. Spoke with Dr Shirlee Latch in charge of patients care regarding pain management orders and plan for referral.   2. Bowel Regimen:ordered Senakot-S twice daily  4. Psychosocial:Emotional support to patient  5. Spiritual:refused consult    Brief HPI: Patients is a 61 yo AAF with PMH: Gallstone pancreatitis (s/p cholecystectomy 2003), chronic pancreatitis (s/p resection of pancreas), CAD (s/p CABG), ischemic cardiomyopathy (s/p  [employer]/AICD-EF 10% 04/2012), sever global hypokinesis, mild aortic stenosis, moderate to sever mitral valve regurgitation, ESRD (initiated on peritoneal dialysis 11/2 years ago then transitioned to HD 6 mths ago (Tue/Thus/Sat), history also includes DM, NTH, anemia, esophageal varices, fatty liver disease, bacteremia, Candida fungemia, perforated bowel and GI bleed. CT scan this admission indicating possible infected pancreatic pseudocyst/ pancreatic inflammation and/or necrosis, patient also has L arm edema (AV-graft site) negative doppler for DVT.   ROS: + abdominal pain, +decreased appetite, +generalized weakness, denies dark tarry stools, n/v, anxiety    PMH:  Past Medical History  Diagnosis Date  . Gallstone pancreatitis     chronic pancreatitis s/p resection of gallbladder and panreas  . Coronary artery disease     s/p CABG 2008 with multiple PCIs  . Ischemic cardiomyopathy     Cath 04/2012, significant calcifications  . History of nonadherence to medical treatment   . Hyperlipidemia   . Systolic CHF     40/9811 echo EF 10%  . Esophageal varices   . Bacteremia     enterobacter 09/2011, MRSA 09/2011  . Fatty liver   . GIB (gastrointestinal bleeding)     per pt history   . 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 Perforated bowel     09/2011-no surgical intervention  . Pacemaker   . ICD (implantable cardiac defibrillator) in place   . Hypertension     "used to have HTN; now I'm low" (05/23/2012)  . CHF (congestive heart failure)   . Heart murmur     "just found that out this year" (05/23/2012)  . NSTEMI (non-ST elevated myocardial infarction)     04/2012; Trop peaked to 1.39; 05/23/2012 pt denies ever having MI  . Pneumonia 02/2012    "first time ever" (05/23/2012)  . Chronic bronchitis 1970's thru 2002    "went away when I stopped smoking in 2002" (05/23/2012)  . Diabetes mellitus     Diet controlled  . History of blood transfusion     "alot of them" (05/23/2012)  .  End stage renal disease     T,Th,Sat HD by [facility] (05/23/2012); h/o peritoneal dialysis prior; uses right chest cath for HD, left arm graft placed by [facility] but not ready for use  . Iron (Fe) deficiency anemia     "severe" (05/23/2012)  . Fatty liver     with cirrhosis and esophageal varices      PSH: Past Surgical History  Procedure Date  . Esophagogastroduodenoscopy 09/15/2011    Procedure: ESOPHAGOGASTRODUODENOSCOPY (EGD);  Surgeon: Theda Belfast, MD;  Location: [facility] ENDOSCOPY;  Service: Endoscopy;  Laterality: N/A;  . Cholecystectomy 2003  . Tonsillectomy and adenoidectomy 1961  . Appendectomy 1973?  [name] Vaginal hysterectomy 1973?  [name] Tubal ligation 1972  . Dilation and curettage of uterus     "bunch from profuse bleeding in the 1970's" (05/23/2012)  . Coronary artery bypass graft 2008    CABG X4  . Coronary angioplasty with stent placement 2008    "1; day after CABG" (05/23/2012)  . Cardiac catheterization     "before 2008 and 3 wk ago" (05/23/2012)  . Insert / replace / remove pacemaker 06/2011    pacemaker ICD  . Cardiac defibrillator placement 06/2011  . Arteriovenous graft  placement 03/2012    right antecub  . Insertion of dialysis catheter ~ 10/2011    right chest  . Peritoneal catheter insertion 08/2011  . Peritoneal catheter removal ~ 10/2011   I have reviewed the FH and SH and  If appropriate update it with new information. Allergies  Allergen Reactions  . Codeine Itching  . Morphine And Related Shortness Of Breath    SOB when given IV once "to me it was mild; I called out fast for help; never had to intubate" (05/23/2012)  . Ace Inhibitors Other (See Comments)    Unknown reaction but her physician stated that she can't take it (specifically lisinopril)  . Tylenol (Acetaminophen) Other (See Comments)    Patient states that the doctor told her she has a Fatty Liver.   Scheduled Meds:   . aspirin EC  81 mg Oral Daily  . carvedilol  6.25 mg  Oral BID WC  . darbepoetin (ARANESP) injection - DIALYSIS  100 mcg Intravenous Q Thu-HD  . enoxaparin (LOVENOX) injection  30 mg Subcutaneous Q24H  . feeding supplement (NEPRO CARB STEADY)  237 mL Oral BID BM  . hydrALAZINE  25 mg Oral Q8H  . HYDROmorphone  4 mg Oral Q4H  . isosorbide mononitrate  30 mg Oral Daily  . methadone  5 mg Oral TID  . metroNIDAZOLE  500 mg Oral Q8H  . multivitamin  1 tablet Oral Daily  . pantoprazole  40 mg Oral Daily  . piperacillin-tazobactam (ZOSYN)  IV  2.25 g Intravenous Q8H  . ranolazine  500 mg Oral Daily  . senna-docusate  1 tablet Oral BID  . sevelamer  400 mg Oral Q breakfast  . simvastatin  10 mg Oral q1800  . sodium chloride  3 mL Intravenous Q12H  . vancomycin  500 mg Intravenous Q T,Th,Sa-HD   Continuous Infusions:  PRN Meds:.camphor-menthol, docusate sodium, HYDROmorphone, hydrOXYzine, nitroGLYCERIN, ondansetron (ZOFRAN) IV, ondansetron, sorbitol, zolpidem    BP 111/63  Pulse 91  Temp 98.3 F (36.8 C) (Oral)  Resp 20  Ht 5\' 4"  (1.626 m)  Wt 59.8 kg (131 lb 13.4 oz)  BMI 22.63 kg/m2  SpO2 97%   PPS: 60%   Intake/Output Summary (Last 24 hours) at 05/26/12 1925 Last data filed at 05/26/12 1900  Gross per 24 hour  Intake    930 ml  Output   3000 ml  Net  -2070 ml   LBM:05/26/12                      Stool Softner:ordered  Physical Exam:  General: Alert/oriented, pleasant HEENT:  Anicteric, buccal mucosa moist Chest:CTA bilaterally   CVS: RRR Abdomen:soft, mildly tender LLQ, BS audible Ext: L arm from hand to shoulder 3-4+ edema, + bruit over fistula Neuro:Alert/oriented x3  Labs: CBC    Component Value Date/Time   WBC 8.9 05/26/2012 0740   WBC 6.7 04/08/2011 1054   RBC 3.51* 05/26/2012 0740   RBC 3.40* 04/08/2011 1054   HGB 9.5* 05/26/2012 0740   HGB 10.1* 04/08/2011 1054   HCT 31.9* 05/26/2012 0740   HCT 30.1* 04/08/2011 1054   PLT 186 05/26/2012 0740   PLT 232 04/08/2011 1054   MCV 90.9 05/26/2012 0740   MCV  88.6 04/08/2011 1054   MCH 27.1 05/26/2012 0740   MCH 29.7 04/08/2011 1054   MCHC 29.8* 05/26/2012 0740   MCHC 33.5 04/08/2011 1054   RDW 17.1* 05/26/2012 0740   RDW 20.4* 04/08/2011 1054  LYMPHSABS 0.9 05/25/2012 0725   LYMPHSABS 1.4 04/08/2011 1054   MONOABS 0.8 05/25/2012 0725   MONOABS 0.4 04/08/2011 1054   EOSABS 0.1 05/25/2012 0725   EOSABS 0.2 04/08/2011 1054   BASOSABS 0.0 05/25/2012 0725   BASOSABS 0.0 04/08/2011 1054    BMET    Component Value Date/Time   NA 133* 05/26/2012 0615   K 4.7 05/26/2012 0615   CL 95* 05/26/2012 0615   CO2 26 05/26/2012 0615   GLUCOSE 88 05/26/2012 0615   BUN 30* 05/26/2012 0615   CREATININE 3.30* 05/26/2012 0615   CALCIUM 11.1* 05/26/2012 0615   GFRNONAA 14* 05/26/2012 0615   GFRAA 16* 05/26/2012 0615    CMP     Component Value Date/Time   NA 133* 05/26/2012 0615   K 4.7 05/26/2012 0615   CL 95* 05/26/2012 0615   CO2 26 05/26/2012 0615   GLUCOSE 88 05/26/2012 0615   BUN 30* 05/26/2012 0615   CREATININE 3.30* 05/26/2012 0615   CALCIUM 11.1* 05/26/2012 0615   PROT 6.5 05/25/2012 0725   ALBUMIN 1.8* 05/26/2012 0615   AST 14 05/25/2012 0725   ALT 11 05/25/2012 0725   ALKPHOS 126* 05/25/2012 0725   BILITOT 0.3 05/25/2012 0725   GFRNONAA 14* 05/26/2012 0615   GFRAA 16* 05/26/2012 0615    Time In Time Out Total Time Spent with Patient Total Overall Time  12:00p 1:30p 60 min 90 min    Greater than 50%  of this time was spent counseling and coordinating care related to the above assessment and plan.   Freddie Breech, CNS-C Palliative Medicine Team [facility] Team Phone: [phone] Pager: [phone]

## 2012-05-26 NOTE — Progress Notes (Signed)
NP for Palliative care informed me to keep Methadone 5 mg dose tid.  Dilaudid 4 mg q4 hours. Prn oral Dilaudid 1 mg q2 hours prn.  NP is trying to get her into the pain clinic to get the Methadone outpatient since her PCP will not prescribe.   Shirlee Latch MD

## 2012-05-26 NOTE — Progress Notes (Addendum)
Subjective: Patient c/o 8/10 lower abdominal pain but feels better after 1.5 L taken off paracentesis.  She denies chest pain or sob  Objective: Vital signs in last 24 hours: Filed Vitals:   05/26/12 0725 05/26/12 0730 05/26/12 0800 05/26/12 0830  BP: 128/75 135/79 121/70 133/73  Pulse: 78 75 75 74  Temp:      TempSrc:      Resp: 20 20    Height:      Weight:      SpO2: 96%      Weight change: -3 lb 4.9 oz (-1.5 kg)  Intake/Output Summary (Last 24 hours) at 05/26/12 0901 Last data filed at 05/26/12 1610  Gross per 24 hour  Intake    363 ml  Output      0 ml  Net    363 ml   General: alert and oriented x 3, nad, pleasant, very talkative in HD HEENT: [place]/at CV: RRR, no rubs or gallops or murmurs, ICD intact, surgical scars to chest; right chest with HD cath intact Lungs: ctab Ab: mod ttp LLQ, RLQ,  hypoactive bs, decreased distension Extremities: warm, no cyanosis or edema except left upper extremity with edema no change since 12/10 but palpable thrill graft. No erythema or drainage s/p graft LUE Neuro: CN2-12 grossly intact; moving all 4 extremities   Lab Results: Basic Metabolic Panel:  Lab 05/26/12 9604 05/25/12 0725 05/24/12 0809  NA 133* 135 --  K 4.7 3.6 --  CL 95* 98 --  CO2 26 29 --  GLUCOSE 88 132* --  BUN 30* 21 --  CREATININE 3.30* 2.66* --  CALCIUM 11.1* 9.9 --  MG -- -- --  PHOS 4.0 -- 3.8   Liver Function Tests:  Lab 05/26/12 0615 05/25/12 0725 05/23/12 0715  AST -- 14 22  ALT -- 11 17  ALKPHOS -- 126* 172*  BILITOT -- 0.3 0.3  PROT -- 6.5 6.8  ALBUMIN 1.8* 1.8* --    Lab 05/22/12 1246  LIPASE 9*  AMYLASE --   CBC:  Lab 05/26/12 0740 05/25/12 0725 05/24/12 0510  WBC 8.9 9.8 --  NEUTROABS -- 7.9* 8.7*  HGB 9.5* 9.9* --  HCT 31.9* 31.7* --  MCV 90.9 88.8 --  PLT 186 175 --   Cardiac Enzymes:  Lab 05/22/12 1608 05/22/12 0709  CKTOTAL -- --  CKMB -- --  CKMBINDEX -- --  TROPONINI <0.30 <0.30   Urinalysis:  Lab 05/22/12 2126   COLORURINE YELLOW  LABSPEC 1.017  PHURINE 8.5*  GLUCOSEU NEGATIVE  HGBUR NEGATIVE  BILIRUBINUR NEGATIVE  KETONESUR NEGATIVE  PROTEINUR 100*  UROBILINOGEN 0.2  NITRITE NEGATIVE  LEUKOCYTESUR SMALL*   Misc. Labs: none  Micro Results: Recent Results (from the past 240 hour(s))  URINE CULTURE     Status: Normal   Collection Time   05/22/12  9:26 PM      Component Value Range Status Comment   Specimen Description URINE, RANDOM   Final    Special Requests ADDED 2220   Final    Culture  Setup Time 05/22/2012 22:33   Final    Colony Count NO GROWTH   Final    Culture NO GROWTH   Final    Report Status 05/23/2012 FINAL   Final   CULTURE, BLOOD (ROUTINE X 2)     Status: Normal (Preliminary result)   Collection Time   05/22/12 11:20 PM      Component Value Range Status Comment   Specimen Description BLOOD RIGHT ARM  Final    Special Requests BOTTLES DRAWN AEROBIC AND ANAEROBIC 10CC EA   Final    Culture  Setup Time 05/23/2012 09:25   Final    Culture     Final    Value:        BLOOD CULTURE RECEIVED NO GROWTH TO DATE CULTURE WILL BE HELD FOR 5 DAYS BEFORE ISSUING A FINAL NEGATIVE REPORT   Report Status PENDING   Incomplete   CULTURE, BLOOD (ROUTINE X 2)     Status: Normal (Preliminary result)   Collection Time   05/22/12 11:54 PM      Component Value Range Status Comment   Specimen Description BLOOD RIGHT HAND   Final    Special Requests BOTTLES DRAWN AEROBIC AND ANAEROBIC 10CC EA   Final    Culture  Setup Time 05/23/2012 09:25   Final    Culture     Final    Value:        BLOOD CULTURE RECEIVED NO GROWTH TO DATE CULTURE WILL BE HELD FOR 5 DAYS BEFORE ISSUING A FINAL NEGATIVE REPORT   Report Status PENDING   Incomplete   SURGICAL PCR SCREEN     Status: Normal   Collection Time   05/23/12  4:20 PM      Component Value Range Status Comment   MRSA, PCR NEGATIVE  NEGATIVE Final    Staphylococcus aureus NEGATIVE  NEGATIVE Final   BODY FLUID CULTURE     Status: Normal (Preliminary  result)   Collection Time   05/25/12  1:02 PM      Component Value Range Status Comment   Specimen Description FLUID ASCITIC   Final    Special Requests SYRINGE @ 60CC   Final    Gram Stain     Final    Value: RARE WBC PRESENT, PREDOMINANTLY MONONUCLEAR     NO ORGANISMS SEEN   Culture NO GROWTH   Final    Report Status PENDING   Incomplete    Studies/Results: Ct Abdomen Pelvis W Contrast  05/24/2012  *RADIOLOGY REPORT*  Clinical Data: Lower abdominal pain.  Evaluate the pancreas (history of pancreatitis).  Fever of unknown origin.  CT ABDOMEN AND PELVIS WITH CONTRAST  Technique:  Multidetector CT imaging of the abdomen and pelvis was performed following the standard protocol during bolus administration of intravenous contrast.  Contrast: OMNIPAQUE IOHEXOL 300 MG/ML  SOLN  Comparison: CT of the abdomen and pelvis 02/23/2012.  Findings:  Lung Bases: Severe cardiomegaly with marked dilatation of the left ventricle.  Atherosclerotic calcifications in the left anterior descending, left circumflex and right coronary arteries.  Pacemaker / AICD leads terminating in the right atrium and right ventricular apex, as well as overlying the lateral wall of the left ventricle (via the coronary sinus).  Small bilateral pleural effusions (right greater than left).  Dependent atelectasis in the lower lobes of the lungs bilaterally.  Abdomen/Pelvis:  Status post cholecystectomy.  There is an unusual pattern of hypoperfusion throughout the central liver which partially normalizes on the delayed phase images.  In segment 3 there is an ill-defined 1.8 cm lesion which is incompletely characterized.  This is similar in size to the prior examination 02/23/2012.  The gallbladder has been removed.  The pancreas is difficult to discretely visualized on either phase of the examination, such that an acute pancreatitis is suspect. Additionally, in the tail of the pancreas extending toward the splenic hilum there is an  abnormal ill-defined fluid and gas collection, suspicious for  a complex infected pseudocyst.   The adrenal glands are poorly visualized.  Parenchymal thinning is noted the kidneys bilaterally.  Numerous low attenuation lesions are noted in the kidneys bilaterally, likely represents small cysts.  Extensive atherosclerosis throughout the abdominal and pelvic vasculature, without definite aneurysm or dissection.  Large volume of ascites. The appendix is normal.  Numerous colonic diverticula.  Accurate assessment for diverticulitis is not possible on this examination given the large volume of ascites. Status post hysterectomy.  Ovaries are not confidently identified and may be surgically absent or atrophic.  Low-lying pelvic floor, suggesting pelvic organ prolapse.  Specifically, there is a large amount of ascites between the rectum and posterior wall of the vagina (i.e., a peritoneocele).  Musculoskeletal: There are no aggressive appearing lytic or blastic lesions noted in the visualized portions of the skeleton.  IMPRESSION:  1.  The pancreas is very difficult to visualize on today's examination, suggesting significant pancreatic inflammation and/or necrosis.  This is most profound in the region of the tail of the pancreas where there is an abnormal fluid and gas collection which extends posterolaterally via the gastrosplenic ligament and partially encircles the spleen.   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 2.  Large volume of ascites. 3.  Unusual hypoperfusion in the central liver is of uncertain etiology and significance.  Correlation with liver function tests may be warranted. 4.  Normal appendix. 5.  Numerous colonic diverticula.  Accurate assessment for diverticulitis is not possible on this examination given the large volume of ascites. 6.  Severe cardiomegaly with marked left ventricular dilatation. 7.  Small bilateral pleural effusions.  8.  Atherosclerosis, including at least three vessel coronary artery disease. 9.  Additional incidental findings, as above, similar to prior studies.  These results were called by telephone on 05/24/2012 at 03:23 p.m. to Dr. Eben Burow, who verbally acknowledged these results.   Original Report Authenticated By: Trudie Reed, M.D.    US Paracentesis  05/25/2012  *RADIOLOGY REPORT*  Clinical Data: Recurrent ascites.  Request has been made for therapeutic and diagnostic paracentesis  ULTRASOUND GUIDED PARACENTESIS  Comparison:  None at this facility  An ultrasound guided paracentesis was thoroughly discussed with the patient and questions answered.  The benefits, risks, alternatives and complications were also discussed.  The patient understands and wishes to proceed with the procedure.  Written consent was obtained.  Ultrasound was performed to localize and mark an adequate pocket of fluid in the right upper quadrant of the abdomen.  The area was then prepped and draped in the normal sterile fashion.  1% Lidocaine was used for local anesthesia.  Under ultrasound guidance a 19 gauge Yueh catheter was introduced.  Paracentesis was performed.  The catheter was removed and a dressing applied.  Complications:  None immediate  Findings:  A total of approximately 1.5 liters of hazy yellow fluid was removed.  A fluid sample was sent for laboratory analysis.  IMPRESSION: Successful ultrasound guided paracentesis yielding 1.5 liters of ascites.  Read by: Anselm Pancoast, P.A.-C   Original Report Authenticated By: Tacey Ruiz, MD    Medications:  Scheduled Meds:    . aspirin EC  81 mg Oral Daily  . carvedilol  6.25 mg Oral BID WC  . enoxaparin (LOVENOX) injection  30 mg Subcutaneous Q24H  . feeding supplement (NEPRO CARB STEADY)  237 mL Oral BID BM  . hydrALAZINE  25 mg Oral Q8H  . [COMPLETED]  HYDROmorphone (DILAUDID)  injection  0.5 mg Intravenous Once  . HYDROmorphone  2 mg Oral TID  . isosorbide mononitrate   30 mg Oral Daily  . methadone  5 mg Oral TID  . multivitamin  1 tablet Oral Daily  . pantoprazole  40 mg Oral Daily  . piperacillin-tazobactam (ZOSYN)  IV  2.25 g Intravenous Q8H  . ranolazine  500 mg Oral Daily  . sevelamer  400 mg Oral Q breakfast  . simvastatin  10 mg Oral q1800  . sodium chloride  3 mL Intravenous Q12H  . vancomycin  500 mg Intravenous Q T,Th,Sa-HD  . [DISCONTINUED]  HYDROmorphone (DILAUDID) injection  1 mg Intravenous Once   Continuous Infusions:  PRN Meds:.sodium chloride, sodium chloride, [EXPIRED] alteplase, calcium carbonate (dosed in mg elemental calcium), camphor-menthol, docusate sodium, feeding supplement (NEPRO CARB STEADY), heparin, heparin, HYDROmorphone (DILAUDID) injection, hydrOXYzine, lidocaine, lidocaine-prilocaine, nitroGLYCERIN, ondansetron (ZOFRAN) IV, ondansetron, pentafluoroprop-tetrafluoroeth, sorbitol, zolpidem, [DISCONTINUED] HYDROmorphone  Assessment/Plan: 61 y.o female extensive PMH and significant cardiac history presents for resolved chest pain which was probably typical angina after work up with cardiac enzymes neg x 3.  She also had abdominal pain was found to have leukocytosis and spiked a Tmax of 100.5 this admission.     1. Abdominal pain secondary to concerning for complex infected pseudoocyst with pancreatic necrosis Likely secondary to pancreatic inflammation and/necrosis noted on CT.  Most profound in the region of the tail of the pancreas where there is abnormal fluid and gas extending posterolaterally via the gastrosplenic ligament and partially encircles the spleen.  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  This will be a perpetual problem as the patient does not want surgical intervention i.e drainage of infected pseudocyst which is the treatment of choice.  Patient has an EF of 10% and was thought not to be a surgical candidate in the past for this. She will continue to have leukocytosis, fever,  increased risk for SIRS/sepsis in the future with multiple admission for this.  Pain will continue to possibly be an issue. 05/24/12 US paracentesis removed 1.5 L strawberry colored with cloudy appearance, 81 monocytes macrophages, 0.7 Albumin, Amylase 12, TP 2.0, WBC 132, lymphs 7, Eos 1, 11 Neutrophils. No growth to date but the patient has been on antibiotics for 4 days.  Per Up To Date the presence of necrosis serves as a detterant for surgical intervention for pseudocyst drainage and the patient is a poor surgical candidate for removal of the pancreatic necrosis.    Plan  -Continue Dilaudid 2mg  tid, Methadone 5 mg tid, Dilaudid 1 mg q3 prn -pending cultures (so far rare WBC no growth) and fungal cultures  -Continue Vanco/Zosyn Day 5 -PC meeting today at 12 PM to discuss  goals of care this admission and the future, pain control issues chronically and discuss what she will do in the future if this keeps happening.  -will ask IDs input for long term Abx since patient does not want surgical intervention -will consult GI for long term management of necrotic pancreas.  Is her pancreas contributing to her current state i.e fever and leukocytosis on admission.    2. Ascites  -s/p multiple prior paracenteses at [facility] last yesterday -Etiology can be from pancreas (less likely as amylase was 12) or liver  (patient does have fatty liver).  She has borderline liver failure with a Child Pugh score of 9 which indicates class B -improved since paracentesis s/p removal of 1.5 L.  -will consider Cipro 500 mg qd at  discharge for SBP prophylaxis.  -Spoke with Dr. Daiva Eves he prefers Bactrim b/c less risk of C. diff  3. 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  They will remove graft in the future, not urgent  4. ESRD on HD TThSat  -consulted [facility], [facility] following for left upper extremity graft site and right chest HD site  5. DVT Px  -Lovenox   6. F/E/N   -will monitor labs -cardiac diet   Dispo: Disposition is deferred at this time, awaiting improvement of current medical problems.  Anticipated discharge in approximately 2-3 day(s).   The patient does have a current PCP (ARMOUR, ROSS B, MD), therefore will be requiring [facility]PC follow-up after discharge.   The patient does not have transportation limitations that hinder transportation to clinic appointments.  .Services Needed at time of discharge: Y = Yes, Blank = No PT:   OT:   RN:   Equipment:   Other:     LOS: 4 days   Annett Gula [phone] 05/26/2012, 9:01 AM  -------------------------------------------------------------------------------------------------------- Internal Medicine Teaching Service Attending Note Date: 05/26/2012  Patient name: [name]  Medical record number: [id number]  Date of birth: 03/31/1951   I saw the patient today and reviewed the plan of care with my resident team.   Subjective: The patient was getting dialysis when I saw her. She was talkative, cheery and did not seem in any distress. She said her belly pain post paracentesis was better.   Objective: Filed Vitals:   05/26/12 1000 05/26/12 1030 05/26/12 1132 05/26/12 1449  BP: 107/71 105/66 121/70 111/63  Pulse: 63 78 85 91  Temp:   98.1 F (36.7 C) 98.3 F (36.8 C)  TempSrc:   Oral Oral  Resp:   20 20  Height:      Weight:      SpO2:   97% 97%    General: Resting in bed, getting HD. HEENT: PERRL, EOMI, no scleral icterus. Heart: RRR, no rubs, murmurs or gallops. Lungs: Clear to auscultation bilaterally, no wheezes, rales, or rhonchi. Portacath present.  Abdomen: Soft, nontender, mildly distended, BS present. Extremities: Warm, no pedal edema. Huge left arm edema with thrill over the AV fistula.  Neuro: Alert and oriented X3, grossly non-focal.  Relevant Labs  Lab 05/26/12 0615 05/25/12 0725 05/24/12 0809 05/23/12 0715 05/22/12 0709  NA 133* 135 129* 133* 135  K 4.7 3.6 --  -- --  CL 95* 98 90* 94* 96  CO2 26 29 26 27 30   GLUCOSE 88 132* 155* 122* 114*  BUN 30* 21 52* 39* 28*  CREATININE 3.30* 2.66* 4.95* 4.42* 3.62*  CALCIUM 11.1* 9.9 11.0* 11.0* 10.5  MG -- -- -- -- --  PHOS 4.0 -- 3.8 -- --     Lab 05/26/12 0740 05/25/12 0725 05/24/12 0809  HGB 9.5* 9.9* 10.0*  HCT 31.9* 31.7* 32.2*  WBC 8.9 9.8 10.7*  PLT 186 175 176   Recent Imaging CT Abdomen Pelvis with contrast (05/24/12) IMPRESSION:  1. The pancreas is very difficult to visualize on today's  examination, suggesting significant pancreatic inflammation and/or  necrosis. This is most profound in the region of the tail of the  pancreas where there is an abnormal fluid and gas collection which  extends posterolaterally via the gastrosplenic ligament and  partially encircles the spleen. Findings are highly concerning  for a complex infected pancreatic pseudocyst (likely with  pancreatic necrosis).  2. Large volume of ascites.  3. Unusual hypoperfusion in the central liver is of uncertain  etiology and significance. Correlation with liver function tests  may be warranted.  4. Normal appendix.  5. Numerous colonic diverticula. Accurate assessment for  diverticulitis is not possible on this examination given the large  volume of ascites.  6. Severe cardiomegaly with marked left ventricular dilatation.  7. Small bilateral pleural effusions.  8. Atherosclerosis, including at least three vessel coronary  artery disease.  9. Additional incidental findings, as above, similar to prior  studies.  Assessment and Plan Possible infected pseudocyst: Antibiotic support with Vancomycin and Zocyn (Day 5). We will follow ID consult regarding the duration of antibiotic and plan of discharge. The patient is not a candidate for surgery.   Ascitis: Paracentesis has been done that rules out SBP. Amylase content was 12, thus it does not seem to be of pancreatic origin.   GI consult was done regarding both  these issues, and since the problems are chronic, they do not want any active interventions right now, also given that the patient is not a candidate for surgery.    Left arm edema - Vascular surgery consult done. Doppler of this arm done recently on 05/10/12 was negative for DVT. The patient does not complain of any active pain at this point. This arm does not seem to be the source of infection at this point.   Other chronic issues per resident note.    Thanks Aletta Edouard, MD 05/26/2012, 3:48 PM

## 2012-05-26 NOTE — Progress Notes (Signed)
Subjective:  On hd , some left abd. Discomfort , "but feels better than yesterday, better since they drew that fluid off my abd."=Paracentesis yesterday Objective Vital signs in last 24 hours: Filed Vitals:   05/26/12 0725 05/26/12 0730 05/26/12 0800 05/26/12 0830  BP: 128/75 135/79 121/70 133/73  Pulse: 78 75 75 74  Temp:      TempSrc:      Resp: 20 20    Height:      Weight:      SpO2: 96%      Weight change: -1.5 kg (-3 lb 4.9 oz)  Intake/Output Summary (Last 24 hours) at 05/26/12 0844 Last data filed at 05/26/12 1610  Gross per 24 hour  Intake    363 ml  Output      0 ml  Net    363 ml   Labs: Basic Metabolic Panel:  Lab 05/26/12 9604 05/25/12 0725 05/24/12 0809  NA 133* 135 129*  K 4.7 3.6 4.4  CL 95* 98 90*  CO2 26 29 26   GLUCOSE 88 132* 155*  BUN 30* 21 52*  CREATININE 3.30* 2.66* 4.95*  CALCIUM 11.1* 9.9 11.0*  ALB -- -- --  PHOS 4.0 -- 3.8   Liver Function Tests:  Lab 05/26/12 0615 05/25/12 0725 05/24/12 0809 05/23/12 0715 05/22/12 1246  AST -- 14 -- 22 35  ALT -- 11 -- 17 19  ALKPHOS -- 126* -- 172* 144*  BILITOT -- 0.3 -- 0.3 0.3  PROT -- 6.5 -- 6.8 6.4  ALBUMIN 1.8* 1.8* 1.8* -- --    Lab 05/22/12 1246  LIPASE 9*  AMYLASE --   No results found for this basename: AMMONIA:3 in the last 168 hours CBC:  Lab 05/26/12 0740 05/25/12 0725 05/24/12 0809 05/24/12 0510 05/23/12 0715  WBC 8.9 9.8 10.7* -- --  NEUTROABS -- 7.9* -- 8.7* 10.7*  HGB 9.5* 9.9* 10.0* -- --  HCT 31.9* 31.7* 32.2* -- --  MCV 90.9 88.8 88.0 88.5 89.7  PLT 186 175 176 -- --   Cardiac Enzymes:  Lab 05/22/12 1608 05/22/12 0709  CKTOTAL -- --  CKMB -- --  CKMBINDEX -- --  TROPONINI <0.30 <0.30   CBG:  Lab 05/26/12 0611 05/25/12 2148 05/25/12 1622 05/25/12 1114 05/25/12 0547  GLUCAP 91 197* 125* 149* 82    Iron Studies: No results found for this basename: IRON,TIBC,TRANSFERRIN,FERRITIN in the last 72 hours Studies/Results: Ct Abdomen Pelvis W Contrast  05/24/2012   *RADIOLOGY REPORT*  Clinical Data: Lower abdominal pain.  Evaluate the pancreas (history of pancreatitis).  Fever of unknown origin.  CT ABDOMEN AND PELVIS WITH CONTRAST  Technique:  Multidetector CT imaging of the abdomen and pelvis was performed following the standard protocol during bolus administration of intravenous contrast.  Contrast: OMNIPAQUE IOHEXOL 300 MG/ML  SOLN  Comparison: CT of the abdomen and pelvis 02/23/2012.  Findings:  Lung Bases: Severe cardiomegaly with marked dilatation of the left ventricle.  Atherosclerotic calcifications in the left anterior descending, left circumflex and right coronary arteries.  Pacemaker / AICD leads terminating in the right atrium and right ventricular apex, as well as overlying the lateral wall of the left ventricle (via the coronary sinus).  Small bilateral pleural effusions (right greater than left).  Dependent atelectasis in the lower lobes of the lungs bilaterally.  Abdomen/Pelvis:  Status post cholecystectomy.  There is an unusual pattern of hypoperfusion throughout the central liver which partially normalizes on the delayed phase images.  In segment 3 there  is an ill-defined 1.8 cm lesion which is incompletely characterized.  This is similar in size to the prior examination 02/23/2012.  The gallbladder has been removed.  The pancreas is difficult to discretely visualized on either phase of the examination, such that an acute pancreatitis is suspect. Additionally, in the tail of the pancreas extending toward the splenic hilum there is an abnormal ill-defined fluid and gas collection, suspicious for a complex infected pseudocyst.   The adrenal glands are poorly visualized.  Parenchymal thinning is noted the kidneys bilaterally.  Numerous low attenuation lesions are noted in the kidneys bilaterally, likely represents small cysts.  Extensive atherosclerosis throughout the abdominal and pelvic vasculature, without definite aneurysm or dissection.  Large volume  of ascites. The appendix is normal.  Numerous colonic diverticula.  Accurate assessment for diverticulitis is not possible on this examination given the large volume of ascites. Status post hysterectomy.  Ovaries are not confidently identified and may be surgically absent or atrophic.  Low-lying pelvic floor, suggesting pelvic organ prolapse.  Specifically, there is a large amount of ascites between the rectum and posterior wall of the vagina (i.e., a peritoneocele).  Musculoskeletal: There are no aggressive appearing lytic or blastic lesions noted in the visualized portions of the skeleton.  IMPRESSION:  1.  The pancreas is very difficult to visualize on today's examination, suggesting significant pancreatic inflammation and/or necrosis.  This is most profound in the region of the tail of the pancreas where there is an abnormal fluid and gas collection which extends posterolaterally via the gastrosplenic ligament and partially encircles the spleen.   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 2.  Large volume of ascites. 3.  Unusual hypoperfusion in the central liver is of uncertain etiology and significance.  Correlation with liver function tests may be warranted. 4.  Normal appendix. 5.  Numerous colonic diverticula.  Accurate assessment for diverticulitis is not possible on this examination given the large volume of ascites. 6.  Severe cardiomegaly with marked left ventricular dilatation. 7.  Small bilateral pleural effusions. 8.  Atherosclerosis, including at least three vessel coronary artery disease. 9.  Additional incidental findings, as above, similar to prior studies.  These results were called by telephone on 05/24/2012 at 03:23 p.m. to Dr. Eben Burow, who verbally acknowledged these results.   Original Report Authenticated By: Trudie Reed, M.D.    US Paracentesis  05/25/2012  *RADIOLOGY REPORT*  Clinical Data: Recurrent ascites.   Request has been made for therapeutic and diagnostic paracentesis  ULTRASOUND GUIDED PARACENTESIS  Comparison:  None at this facility  An ultrasound guided paracentesis was thoroughly discussed with the patient and questions answered.  The benefits, risks, alternatives and complications were also discussed.  The patient understands and wishes to proceed with the procedure.  Written consent was obtained.  Ultrasound was performed to localize and mark an adequate pocket of fluid in the right upper quadrant of the abdomen.  The area was then prepped and draped in the normal sterile fashion.  1% Lidocaine was used for local anesthesia.  Under ultrasound guidance a 19 gauge Yueh catheter was introduced.  Paracentesis was performed.  The catheter was removed and a dressing applied.  Complications:  None immediate  Findings:  A total of approximately 1.5 liters of hazy yellow fluid was removed.  A fluid sample was sent for laboratory analysis.  IMPRESSION: Successful ultrasound guided paracentesis yielding 1.5 liters of ascites.  Read by: Anselm Pancoast, P.A.-C  Original Report Authenticated By: Tacey Ruiz, MD    Medications:      . aspirin EC  81 mg Oral Daily  . carvedilol  6.25 mg Oral BID WC  . enoxaparin (LOVENOX) injection  30 mg Subcutaneous Q24H  . feeding supplement (NEPRO CARB STEADY)  237 mL Oral BID BM  . hydrALAZINE  25 mg Oral Q8H  . [COMPLETED]  HYDROmorphone (DILAUDID) injection  0.5 mg Intravenous Once  . HYDROmorphone  2 mg Oral TID  . isosorbide mononitrate  30 mg Oral Daily  . methadone  5 mg Oral TID  . multivitamin  1 tablet Oral Daily  . pantoprazole  40 mg Oral Daily  . piperacillin-tazobactam (ZOSYN)  IV  2.25 g Intravenous Q8H  . ranolazine  500 mg Oral Daily  . sevelamer  400 mg Oral Q breakfast  . simvastatin  10 mg Oral q1800  . sodium chloride  3 mL Intravenous Q12H  . vancomycin  500 mg Intravenous Q T,Th,Sa-HD  . [DISCONTINUED]  HYDROmorphone (DILAUDID) injection   1 mg Intravenous Once   I  have reviewed scheduled and prn medications.  Physical Exam: General: Alert on hd NAD, Appropriate Heart: RRR, no rub 1/6 sem lsb  apex Lungs: Bilaterally at bases decreased bs, otherwise CTA Abdomen: BS +, mild ascites, tender diffusely, soft Extremities: Dialysis Access:  Right  Arm swelling continues/ trace bipedal edema,   R IJ Perm Cath  Dialysis Orders: Center: [facility] On TTS- transferred there 05/03/2012;  EDW 53.5 HD Bath 3K 2.5 Ca Optiflux 180 Time 4 Heparin 1600. Access left forearm AVGG placed 10/25 Dr. Hollace Hayward - not used yet; right I-J active BFR 400 DFR 800 Zemplar none IV/HD Epogen 15,000 Units IV/HD No IV Fe Transfused 2 units during November admission  Problem/Plan: 1. Fever/ Abd pain = ID  Following  Wbc dwon to 8.9/ on vanco and Zosyn/ pending fluid cultures from abd/ perm cath ? Source 2. LUE edema sp LU arm AVGG Placement At [facility] Med center / Edema secondary to suspected Pacer wires and with L subcl v stenosis/ Occlusion/ May need avgg ligation for swelling 3. ESRD - Normal schedule TTS ([facility])may need  4 days as ouptpt sec  To 10% ef cm  4. CHEST PAIN/CAD no further pain. Med tx per Cardiology 5. Anemia - hgb= 9.5 on EPO as op  6. Secondary hyperparathyroidism - hypercalcemia 12.6 no zemplar  2.0 ca bath/ 4.0 phos renvela  400mg  at breakfast 7. Malnutrition-  Alb 1.7  On nepro and renal diet/ hepatic involvement also a role low alb 8. Type 2  DM = diet control  Lenny Pastel, PA-C [facility] [phone] 05/26/2012,8:44 AM  LOS: 4 days

## 2012-05-26 NOTE — Procedures (Signed)
Patient seen on Hemodialysis. QB 400, UF goal 3.8L Treatment adjusted as needed.  Zetta Bills MD [facility]. Office # [phone] Pager # [phone] 8:58 AM

## 2012-05-27 DIAGNOSIS — R188 Other ascites: Secondary | ICD-10-CM

## 2012-05-27 DIAGNOSIS — N186 End stage renal disease: Secondary | ICD-10-CM

## 2012-05-27 LAB — GLUCOSE, CAPILLARY: Glucose-Capillary: 76 mg/dL (ref 70–99)

## 2012-05-27 MED ORDER — SENNOSIDES-DOCUSATE SODIUM 8.6-50 MG PO TABS: 1.0000 | ORAL_TABLET | Freq: Two times a day (BID) | ORAL | Status: DC

## 2012-05-27 MED ORDER — SULFAMETHOXAZOLE-TRIMETHOPRIM 800-160 MG PO TABS: 1.0000 | ORAL_TABLET | Freq: Once | ORAL | Status: DC

## 2012-05-27 MED ORDER — VANCOMYCIN HCL 500 MG IV SOLR: 500.0000 mg | INTRAVENOUS | Status: DC

## 2012-05-27 MED ORDER — HYDROMORPHONE HCL 4 MG PO TABS: 4.0000 mg | ORAL_TABLET | ORAL | Status: DC

## 2012-05-27 MED ORDER — METRONIDAZOLE 500 MG PO TABS: 500.0000 mg | ORAL_TABLET | Freq: Three times a day (TID) | ORAL | Status: DC

## 2012-05-27 MED ORDER — NITROGLYCERIN 0.4 MG SL SUBL: 0.4000 mg | SUBLINGUAL_TABLET | SUBLINGUAL | Status: DC | PRN

## 2012-05-27 MED ORDER — HYDROMORPHONE HCL 2 MG PO TABS: 1.0000 mg | ORAL_TABLET | ORAL | Status: DC | PRN

## 2012-05-27 MED ORDER — METHADONE HCL 5 MG PO TABS: 5.0000 mg | ORAL_TABLET | Freq: Three times a day (TID) | ORAL | Status: DC

## 2012-05-27 MED ORDER — VANCOMYCIN HCL 500 MG IV SOLR: 500.0000 mg | INTRAVENOUS | Status: AC

## 2012-05-27 MED ORDER — CEFTAZIDIME 2 G IV SOLR: 2.0000 g | INTRAVENOUS | Status: AC | PRN

## 2012-05-27 NOTE — Progress Notes (Addendum)
Patient WU:JWJXBJY A Oyama      DOB: 21-Feb-1951      NWG:[id number]   Palliative Medicine Team at [facility] Progress Note    Subjective:Patient states she slept very well, and that adding the every 4 hour oral Dilaudid 4 mg dosing yesterday evening worked very well in her pain control. I had conversation with patient regarding plan for pain management, she is not insurance eligible to follow up at community pain clinic. I informed her I called [facility] clinic, and they will be calling to schedule appointment on Monday. I told her I will also fax Palliative Care note recommending PCP visit ASAP, and to recommend referral to [facility] or [place] in order to have pain medications ordered on regular basis. Dr Dwyane Luo [phone]), informed of plan, she will also fax discharge summary to [facility].     Filed Vitals:   05/27/12 0533  BP: 107/65  Pulse: 72  Temp: 97.9 F (36.6 C)  Resp: 18   Physical exam: General: alert/oriented HEENT: anicteric,buccal mucosa moist CHEST: CTA bilaterally ABD: soft, mild tenderness LLQ , BS audible EXT: no pedal edema NEURO: oriented x 3  Assessment and plan: Patient's pain controlled with addition of increasing dose and frequency of oral Dilaudid, spaced between Methadone doses.Hopefully patient will be facilitated a transition to [facility] [place] system for future needed pain medication prescriptions.  1)Pain: LLQ abdominal pain 4-5/10 which is tolerable, continue pain medication as per PMT note from 05/26/12.  Plan to fax note to PCR Dr [name] at [facility] (phone # [phone] fax#[phone].  Patient instructed to call PCP's office if no call received from [facility] on Monday.  2) Bowel Regime: Senakot-S added, patient did have BM this am  3) Continuity of Care: Permission granted from patient to have Palliative Care note faxed to PCP [employer] at the [facility] in [place].  Time In Time Out  Total Time Spent with Patient Total Overall Time  12:15p 12:45p 30 min 30 min   Freddie Breech, CNS-C Palliative Medicine Team [facility] Team Phone: [phone] Pager: [phone]

## 2012-05-27 NOTE — Progress Notes (Signed)
Pt received all d/cinstructons. Explained and given to pt.  Verbalized understanding.  Pt d/c to home.  Amanda Pea, Charity fundraiser.

## 2012-05-27 NOTE — Progress Notes (Signed)
Physical Therapy Treatment Patient Details Name: [name] MRN: [id number] DOB: May 13, 1951 Today's Date: 05/27/2012 Time: 6578-4696 PT Time Calculation (min): 30 min  PT Assessment / Plan / Recommendation Comments on Treatment Session  Pt. admitted with abdominal pain from pancreatic cyst and h/o ESRD; Pt. progressing with ambulation and willing to participate in therapy despite being aggravated about her d/c and medications. Pt. states that she has recieved [facility] PT before and thinks that it helps her from getting stiff. Pt. instucted to continue LE exercises,    Follow Up Recommendations  Home health PT           Equipment Recommendations  Other (comment) (Rollator)       Frequency Min 3X/week   Plan Discharge plan remains appropriate;Frequency remains appropriate    Precautions / Restrictions Precautions Precautions: None Restrictions Weight Bearing Restrictions: No   Pertinent Vitals/Pain 3/10 pain in abdomen    Mobility  Bed Mobility Bed Mobility: Sitting - Scoot to Edge of Bed Supine to Sit: 6: Modified independent (Device/Increase time);HOB elevated (30 degrees) Sitting - Scoot to Edge of Bed: 6: Modified independent (Device/Increase time) Transfers Sit to Stand: 6: Modified independent (Device/Increase time);From bed Stand to Sit: 6: Modified independent (Device/Increase time);To chair/3-in-1;With armrests Ambulation/Gait Ambulation/Gait Assistance: 4: Min guard Ambulation Distance (Feet): 120 Feet Assistive device: None Ambulation/Gait Assistance Details: Pt. requested not to use RW because she does not use one at home. Pt. holding on to rail x1 and onto sink in room. Gait Pattern: Step-through pattern;Wide base of support;Decreased stride length Gait velocity: decreased Stairs: No    Exercises General Exercises - Lower Extremity Long Arc Quad: AROM;Both;10 reps;Seated Hip Flexion/Marching: AROM;Both;10 reps;Seated Toe Raises: AROM;Both;20 reps;Seated      PT Goals Acute Rehab PT Goals PT Goal: Supine/Side to Sit - Progress: Progressing toward goal PT Goal: Sit to Stand - Progress: Progressing toward goal PT Goal: Stand to Sit - Progress: Progressing toward goal PT Goal: Ambulate - Progress: Progressing toward goal PT Goal: Perform Home Exercise Program - Progress: Progressing toward goal  Visit Information  Last PT Received On: 05/27/12 Assistance Needed: +1    Subjective Data  Subjective: "They keep screwing up my medication."   Cognition  Overall Cognitive Status: Appears within functional limits for tasks assessed/performed Arousal/Alertness: Awake/alert Orientation Level: Appears intact for tasks assessed Behavior During Session: [facility] for tasks performed       End of Session PT - End of Session Equipment Utilized During Treatment: Gait belt Activity Tolerance: Patient tolerated treatment well Patient left: in chair;with call bell/phone within reach Nurse Communication: Mobility status     Army Chaco SPT 05/27/2012, 3:36 PM

## 2012-05-27 NOTE — Care Management Note (Signed)
    Page 1 of 2   05/27/2012     4:06:15 PM   CARE MANAGEMENT NOTE 05/27/2012  Patient:  [name], [name]   Account Number:  [id number]  Date Initiated:  05/27/2012  Documentation initiated by:  Tera Mater  Subjective/Objective Assessment:   61yo female admitted with Chest Pain. Pt. lives alone in [place].     Action/Plan:   In to speak with pt. about [facility] services.  Pt. is currently being seen by [facility] for [facility] RN and PT, and is satisfied with their care.   Anticipated DC Date:  05/27/2012   Anticipated DC Plan:  HOME W HOME HEALTH SERVICES      DC Planning Services  CM consult      [facility] Choice  Resumption Of Svcs/PTA Provider   Choice offered to / List presented to:     DME arranged  3-N-1      DME agency  [facility]     [facility] arranged  HH-1 RN  HH-2 PT  HH-4 NURSE'S AIDE      HH agency  [facility]   Status of service:  Completed, signed off Medicare Important Message given?   (If response is "NO", the following Medicare IM given date fields will be blank) Date Medicare IM given:   Date Additional Medicare IM given:    Discharge Disposition:  HOME W HOME HEALTH SERVICES  Per UR Regulation:  Reviewed for med. necessity/level of care/duration of stay  If discussed at Long Length of Stay Meetings, dates discussed:    Comments:  05/27/12 1145 TC to Lupita Leash, with [facility] to resume orders for [facility] RN and HH PT.  TC to [place], with [facility], to give referral for DME 3-N-1.  Pt. to dc home today. Tera Mater, RN, BSN NCM [phone]

## 2012-05-27 NOTE — Progress Notes (Signed)
Seen and agree with SPT note [facility] Karisma Meiser, PT [phone]  

## 2012-05-27 NOTE — Progress Notes (Signed)
Verified by Andre Lefort was ok for pt to be d/c home with (R)CW HD catheter for HD.  Amanda Pea, Charity fundraiser.

## 2012-05-27 NOTE — Progress Notes (Signed)
Patient ID: [name], female   DOB: [birth year], 61 y.o.   MRN: [id number]   [facility] Progress Note    Subjective:   Reports some improvement in abdominal symptoms- palliative care note reviewed and plans noted for out-patient pain clinic follow up   Objective:   BP 107/65  Pulse 72  Temp 97.9 F (36.6 C) (Oral)  Resp 18  Ht 5\' 4"  (1.626 m)  Wt 58.06 kg (128 lb)  BMI 21.97 kg/m2  SpO2 100%  Physical Exam: WUJ:WJXBJYNWGNF resting in bed AOZ:HYQMV RRR, normal S1 and S2  Resp:Decreased BS over bases otherwise clear to auscultation HQI:ONGE, some distension noted, tender over upper quadrants Ext:No LE edema  Labs: BMET  Lab 05/26/12 0615 05/25/12 0725 05/24/12 0809 05/23/12 0715 05/22/12 0709  NA 133* 135 129* 133* 135  K 4.7 3.6 4.4 3.9 3.3*  CL 95* 98 90* 94* 96  CO2 26 29 26 27 30   GLUCOSE 88 132* 155* 122* 114*  BUN 30* 21 52* 39* 28*  CREATININE 3.30* 2.66* 4.95* 4.42* 3.62*  ALB -- -- -- -- --  CALCIUM 11.1* 9.9 11.0* 11.0* 10.5  PHOS 4.0 -- 3.8 -- --   CBC  Lab 05/26/12 0740 05/25/12 0725 05/24/12 0809 05/24/12 0510 05/23/12 0715 05/22/12 0709  WBC 8.9 9.8 10.7* 10.6* -- --  NEUTROABS -- 7.9* -- 8.7* 10.7* 18.3*  HGB 9.5* 9.9* 10.0* 9.4* -- --  HCT 31.9* 31.7* 32.2* 29.2* -- --  MCV 90.9 88.8 88.0 88.5 -- --  PLT 186 175 176 167 -- --   Medications:      . aspirin EC  81 mg Oral Daily  . carvedilol  6.25 mg Oral BID WC  . darbepoetin (ARANESP) injection - DIALYSIS  100 mcg Intravenous Q Thu-HD  . enoxaparin (LOVENOX) injection  30 mg Subcutaneous Q24H  . feeding supplement (NEPRO CARB STEADY)  237 mL Oral BID BM  . hydrALAZINE  25 mg Oral Q8H  . HYDROmorphone  4 mg Oral Q4H  . isosorbide mononitrate  30 mg Oral Daily  . methadone  5 mg Oral TID  . metroNIDAZOLE  500 mg Oral Q8H  . multivitamin  1 tablet Oral Daily  . pantoprazole  40 mg Oral Daily  . piperacillin-tazobactam (ZOSYN)  IV  2.25 g Intravenous Q8H  . ranolazine   500 mg Oral Daily  . senna-docusate  1 tablet Oral BID  . sevelamer  400 mg Oral Q breakfast  . simvastatin  10 mg Oral q1800  . sodium chloride  3 mL Intravenous Q12H  . vancomycin  500 mg Intravenous Q T,Th,Sa-HD     Assessment/ Plan:   1. Fever/Abdominal Pain - Improvement in leukocytosis noted- ascitic fluid WBC count highly suggestive of peritonitis (and anticipate cultures to remain negative with preceding antibiotic therapy). Plans noted for long term TMP-SMX after vanc/fortaz and flagyl completed.  2. LUE edema - s/p left AVGG placement. 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 The left brachiocephalic vein and SVC are widely patent. It did not appear to be amenable to endovascular intervention due to the presence of left sided pacer wires through the occluded segment." Her vascular surgery, may benefit from arteriovenous graft ligation at some point to help alleviate left upper extremity swelling.  3. Chest pain with hx CAD/ischem CM EF 10% - medical tx per cards; troponins neg x 2; hx NSTEMI in November;  CP resolved after 2 NTG  4. ESRD - TTS -tolerated hemodialysis yesterday. Plan for routine dialysis tomorrow if she is still here. With an EF of 10%, she may benefit from discussions regarding dialysis 4 days a week (one day being sequential ultrafiltration)  5. Anemia - Hgb improved from last admission  6. Metabolic bone disease - Hypercalcemic with corr Ca of 12.6 - no zemplar; cannot put on 2 Ca bath due to K level; use 2.25 Ca with 4 K bath here; this was an issue last admission as well; waiting on info to be sent from her HD center.IPTH was 178 with November labs; she is not on zemplar.  7. 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 She remains on a renal diet and nepro supplementation.  8. Type 2 DM -  diet controlled    Zetta Bills, MD 05/27/2012, 8:06 AM

## 2012-05-27 NOTE — Progress Notes (Signed)
Internal Medicine Teaching Service Attending Note Date: 05/27/2012  Patient name: [name]  Medical record number: [id number]  Date of birth: [birth year]   I saw the patient today and reviewed the plan of care with my resident team.   Subjective: The patient seems well and wants to go home. She does not offer any new complaints at this time.   Objective: Filed Vitals:   05/26/12 2034 05/27/12 0531 05/27/12 0533 05/27/12 1333  BP: 114/66 107/65 107/65 117/66  Pulse: 82  72   Temp: 98.8 F (37.1 C)  97.9 F (36.6 C)   TempSrc: Oral  Oral   Resp: 18  18   Height:      Weight:   128 lb (58.06 kg)   SpO2: 96%  100%     General: Resting in bed in her room, chatty.  HEENT: PERRL, EOMI, no scleral icterus.  Heart: RRR, no rubs, murmurs or gallops.  Lungs: Clear to auscultation bilaterally, no wheezes, rales, or rhonchi. Portacath present.  Abdomen: Soft, nontender, mildly distended, BS present.  Extremities: Warm, no pedal edema. Left arm edema unchanged from yesterday.  Neuro: Alert and oriented X3, grossly non-focal.  Relevant Labs  Lab 05/26/12 0615 05/25/12 0725 05/24/12 0809 05/23/12 0715 05/22/12 0709  NA 133* 135 129* 133* 135  K 4.7 3.6 -- -- --  CL 95* 98 90* 94* 96  CO2 26 29 26 27 30   GLUCOSE 88 132* 155* 122* 114*  BUN 30* 21 52* 39* 28*  CREATININE 3.30* 2.66* 4.95* 4.42* 3.62*  CALCIUM 11.1* 9.9 11.0* 11.0* 10.5  MG -- -- -- -- --  PHOS 4.0 -- 3.8 -- --    Lab 05/26/12 0740 05/25/12 0725 05/24/12 0809  HGB 9.5* 9.9* 10.0*  HCT 31.9* 31.7* 32.2*  WBC 8.9 9.8 10.7*  PLT 186 175 176    Assessment and Plan  I think the patient is medically stable to be discharged today and be treated on out patient basis for the completion of antibiotic therapy. My team discussed this with the ID attending and they are okay with sending the patient out on Vancomycin and Ceftazidime IV with dialysis and flagyl orally. We will follow their recommendation regarding the  duration of treatment. The patient will follow up with ID in January. Pain control would be per palliative medicine recommendation. We will send the patient out with enough medications to tide her over up until her next appointment with her PCP.   Other chronic issues per resident note.    Thanks Aletta Edouard, MD 05/27/2012, 1:51 PM

## 2012-05-27 NOTE — Progress Notes (Signed)
Subjective: Patients pain is more controlled after palliative adjusted her pain medication.  5/10 ab pain.  She denies other concerns.  She wants to pursue treatment of her left upper extremity edema because she has trouble using her walker.    Objective: Vital signs in last 24 hours: Filed Vitals:   05/26/12 2034 05/27/12 0531 05/27/12 0533 05/27/12 1333  BP: 114/66 107/65 107/65 117/66  Pulse: 82  72 68  Temp: 98.8 F (37.1 C)  97.9 F (36.6 C) 98.4 F (36.9 C)  TempSrc: Oral  Oral Oral  Resp: 18  18 18   Height:      Weight:   128 lb (58.06 kg)   SpO2: 96%  100% 97%   Weight change: 1 lb 12.2 oz (0.8 kg)  Intake/Output Summary (Last 24 hours) at 05/27/12 1532 Last data filed at 05/27/12 1408  Gross per 24 hour  Intake    530 ml  Output      1 ml  Net    529 ml   General: pleasant, very talkative, alert and oriented x 3 HEENT:[place]/at CV: RRR no rubs, murmurs or gallops, right HD cath intact Lungs: ctab Abdomen: mild ttp left mid quadrants; all other quadrants nt, nd, +normal bs  Extremities: warm, no cyanosis or edema except left upper extremity swollen s/p graft placement Neuro: CN 2-12 grossly intact, moving all 4 extremities   Lab Results: Basic Metabolic Panel:  Lab 05/26/12 1610 05/25/12 0725 05/24/12 0809  NA 133* 135 --  K 4.7 3.6 --  CL 95* 98 --  CO2 26 29 --  GLUCOSE 88 132* --  BUN 30* 21 --  CREATININE 3.30* 2.66* --  CALCIUM 11.1* 9.9 --  MG -- -- --  PHOS 4.0 -- 3.8   Liver Function Tests:  Lab 05/26/12 0615 05/25/12 0725 05/23/12 0715  AST -- 14 22  ALT -- 11 17  ALKPHOS -- 126* 172*  BILITOT -- 0.3 0.3  PROT -- 6.5 6.8  ALBUMIN 1.8* 1.8* --    Lab 05/22/12 1246  LIPASE 9*  AMYLASE --   CBC:  Lab 05/26/12 0740 05/25/12 0725 05/24/12 0510  WBC 8.9 9.8 --  NEUTROABS -- 7.9* 8.7*  HGB 9.5* 9.9* --  HCT 31.9* 31.7* --  MCV 90.9 88.8 --  PLT 186 175 --   Cardiac Enzymes:  Lab 05/22/12 1608 05/22/12 0709  CKTOTAL -- --  CKMB --  --  CKMBINDEX -- --  TROPONINI <0.30 <0.30   CBG:  Lab 05/27/12 1122 05/27/12 0529 05/26/12 2108 05/26/12 1613 05/26/12 1323 05/26/12 0611  GLUCAP 184* 76 102* 148* 107* 91   Coagulation:  Lab 05/26/12 1215  LABPROT 15.8*  INR 1.29   Urinalysis:  Lab 05/22/12 2126  COLORURINE YELLOW  LABSPEC 1.017  PHURINE 8.5*  GLUCOSEU NEGATIVE  HGBUR NEGATIVE  BILIRUBINUR NEGATIVE  KETONESUR NEGATIVE  PROTEINUR 100*  UROBILINOGEN 0.2  NITRITE NEGATIVE  LEUKOCYTESUR SMALL*   Misc. Labs: none  Micro Results: Recent Results (from the past 240 hour(s))  URINE CULTURE     Status: Normal   Collection Time   05/22/12  9:26 PM      Component Value Range Status Comment   Specimen Description URINE, RANDOM   Final    Special Requests ADDED 2220   Final    Culture  Setup Time 05/22/2012 22:33   Final    Colony Count NO GROWTH   Final    Culture NO GROWTH   Final  Report Status 05/23/2012 FINAL   Final   CULTURE, BLOOD (ROUTINE X 2)     Status: Normal (Preliminary result)   Collection Time   05/22/12 11:20 PM      Component Value Range Status Comment   Specimen Description BLOOD RIGHT ARM   Final    Special Requests BOTTLES DRAWN AEROBIC AND ANAEROBIC 10CC EA   Final    Culture  Setup Time 05/23/2012 09:25   Final    Culture     Final    Value:        BLOOD CULTURE RECEIVED NO GROWTH TO DATE CULTURE WILL BE HELD FOR 5 DAYS BEFORE ISSUING A FINAL NEGATIVE REPORT   Report Status PENDING   Incomplete   CULTURE, BLOOD (ROUTINE X 2)     Status: Normal (Preliminary result)   Collection Time   05/22/12 11:54 PM      Component Value Range Status Comment   Specimen Description BLOOD RIGHT HAND   Final    Special Requests BOTTLES DRAWN AEROBIC AND ANAEROBIC 10CC EA   Final    Culture  Setup Time 05/23/2012 09:25   Final    Culture     Final    Value:        BLOOD CULTURE RECEIVED NO GROWTH TO DATE CULTURE WILL BE HELD FOR 5 DAYS BEFORE ISSUING A FINAL NEGATIVE REPORT   Report Status PENDING    Incomplete   SURGICAL PCR SCREEN     Status: Normal   Collection Time   05/23/12  4:20 PM      Component Value Range Status Comment   MRSA, PCR NEGATIVE  NEGATIVE Final    Staphylococcus aureus NEGATIVE  NEGATIVE Final   BODY FLUID CULTURE     Status: Normal (Preliminary result)   Collection Time   05/25/12  1:02 PM      Component Value Range Status Comment   Specimen Description FLUID ASCITIC   Final    Special Requests SYRINGE @ 60CC   Final    Gram Stain     Final    Value: RARE WBC PRESENT, PREDOMINANTLY MONONUCLEAR     NO ORGANISMS SEEN   Culture NO GROWTH 1 DAY   Final    Report Status PENDING   Incomplete   FUNGUS CULTURE W SMEAR     Status: Normal (Preliminary result)   Collection Time   05/25/12  1:02 PM      Component Value Range Status Comment   Specimen Description FLUID ASCITIC   Final    Special Requests SYRINGE @ 60CC   Final    Fungal Smear NO YEAST OR FUNGAL ELEMENTS SEEN   Final    Culture CULTURE IN PROGRESS FOR FOUR WEEKS   Final    Report Status PENDING   Incomplete    Studies/Results: No results found. Medications:  Scheduled Meds:    . aspirin EC  81 mg Oral Daily  . carvedilol  6.25 mg Oral BID WC  . darbepoetin (ARANESP) injection - DIALYSIS  100 mcg Intravenous Q Thu-HD  . enoxaparin (LOVENOX) injection  30 mg Subcutaneous Q24H  . feeding supplement (NEPRO CARB STEADY)  237 mL Oral BID BM  . hydrALAZINE  25 mg Oral Q8H  . HYDROmorphone  4 mg Oral Q4H  . isosorbide mononitrate  30 mg Oral Daily  . methadone  5 mg Oral TID  . metroNIDAZOLE  500 mg Oral Q8H  . multivitamin  1 tablet Oral Daily  .  pantoprazole  40 mg Oral Daily  . piperacillin-tazobactam (ZOSYN)  IV  2.25 g Intravenous Q8H  . ranolazine  500 mg Oral Daily  . senna-docusate  1 tablet Oral BID  . sevelamer  400 mg Oral Q breakfast  . simvastatin  10 mg Oral q1800  . sodium chloride  3 mL Intravenous Q12H  . vancomycin  500 mg Intravenous Q T,Th,Sa-HD   Continuous Infusions:   PRN Meds:.camphor-menthol, docusate sodium, HYDROmorphone, hydrOXYzine, nitroGLYCERIN, ondansetron (ZOFRAN) IV, ondansetron, sorbitol, zolpidem  Assessment/Plan: 61 y.o female extensive PMH and significant cardiac history presents for resolved chest pain which was probably typical angina after work up with cardiac enzymes neg x 3. She also had abdominal pain was found to have leukocytosis and spiked a Tmax of 100.5 this admission.   1. Abdominal pain secondary to concerning for complex infected pseudoocyst with pancreatic necrosis  Likely secondary to pancreatic inflammation and/necrosis noted on CT. Not a surgical candidate.  Will treat symptoms  Plan  -Continue Dilaudid 4 mg q4, Dilaudid 1 mg q2 as needed, Methadone 5 mg tid; She is follow up with trying to get set up with outpatient pain center affiliated with the [facility] clinic.  -pending cultures (so far rare WBC no growth) and fungal cultures negative and still pending.  Blood cultures no growth to day.   -Continue Vanco (3 days given this admission) Zosyn x 5 days.  Will continue Vancomycin for 5 more doses with HD TThSat ending 12/24 and change Zosyn to [facility] with HD TThSat ending 12/24.  Added Flagyl 500 mg tid to continue for 4 more days.   -GI stated for patient to follow up at [facility] and no further follow up   2. Ascites  -s/p multiple prior paracenteses at [facility] last yesterday  -Etiology can be from pancreas (less likely as amylase was 12) or liver (patient does have fatty liver). She has borderline liver failure with a Child Pugh score of 9 which indicates class B  -will treat with Bactrim DS 1 tablet daily until discontinued for sbp prophylaxis   3. 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 They will remove graft in the future, not urgent  -will have [facility] call the patient for f/u  4. ESRD on HD TThSat  -consulted [facility] resume normal HD 05/28/12  5. DVT Px  -Lovenox   6. F/E/N   -will monitor labs  -cardiac diet   Dispo: d/c home today  .  The patient does have a current PCP (ARMOUR, ROSS B, MD), therefore will be requiring OPC follow-up after discharge.  The patient does not have transportation limitations that hinder transportation to clinic appointments.  Services Needed at time of discharge: Y = Yes, Blank = No PT: Y  OT: Y  RN: Y  Equipment: Wheelchair (measurements OT) and cushion (measurements OT) (rollator), tub/shower bench, toilet rise with handles   Other: Home aid    LOS: 5 days   Annett Gula [phone] 05/27/2012, 3:32 PM

## 2012-05-28 NOTE — Discharge Summary (Signed)
Internal Medicine Teaching Service Attending Note Date: 05/28/2012  Patient name: [name]  Medical record number: [id number]  Date of birth: 1950-06-20    I evaluated the patient on the day of discharge and discussed the discharge plan with my resident team. I agree with the discharge documentation and disposition.  Thank you for working with me on this patient's care.   Thanks Aletta Edouard 05/28/2012, 11:05 AM

## 2012-05-29 LAB — CULTURE, BLOOD (ROUTINE X 2): Culture: NO GROWTH

## 2012-05-29 LAB — BODY FLUID CULTURE

## 2012-05-30 DIAGNOSIS — Z22322 Carrier or suspected carrier of Methicillin resistant Staphylococcus aureus: Secondary | ICD-10-CM | POA: Insufficient documentation

## 2012-05-30 DIAGNOSIS — T829XXA Unspecified complication of cardiac and vascular prosthetic device, implant and graft, initial encounter: Secondary | ICD-10-CM | POA: Insufficient documentation

## 2012-06-02 ENCOUNTER — Emergency Department (HOSPITAL_COMMUNITY): Payer: Non-veteran care

## 2012-06-02 ENCOUNTER — Observation Stay (HOSPITAL_COMMUNITY)
Admission: EM | Admit: 2012-06-02 | Discharge: 2012-06-03 | Disposition: A | Discharge status: Home / Self Care | Payer: Non-veteran care | Attending: Internal Medicine | Admitting: Internal Medicine

## 2012-06-02 DIAGNOSIS — D649 Anemia, unspecified: Secondary | ICD-10-CM | POA: Insufficient documentation

## 2012-06-02 DIAGNOSIS — R0602 Shortness of breath: Secondary | ICD-10-CM | POA: Insufficient documentation

## 2012-06-02 DIAGNOSIS — Z951 Presence of aortocoronary bypass graft: Secondary | ICD-10-CM | POA: Insufficient documentation

## 2012-06-02 DIAGNOSIS — N186 End stage renal disease: Secondary | ICD-10-CM | POA: Insufficient documentation

## 2012-06-02 DIAGNOSIS — Z992 Dependence on renal dialysis: Secondary | ICD-10-CM | POA: Insufficient documentation

## 2012-06-02 DIAGNOSIS — I120 Hypertensive chronic kidney disease with stage 5 chronic kidney disease or end stage renal disease: Secondary | ICD-10-CM | POA: Insufficient documentation

## 2012-06-02 DIAGNOSIS — R1032 Left lower quadrant pain: Secondary | ICD-10-CM

## 2012-06-02 DIAGNOSIS — E119 Type 2 diabetes mellitus without complications: Secondary | ICD-10-CM | POA: Insufficient documentation

## 2012-06-02 DIAGNOSIS — D72829 Elevated white blood cell count, unspecified: Secondary | ICD-10-CM | POA: Insufficient documentation

## 2012-06-02 DIAGNOSIS — I447 Left bundle-branch block, unspecified: Secondary | ICD-10-CM | POA: Insufficient documentation

## 2012-06-02 DIAGNOSIS — R079 Chest pain, unspecified: Secondary | ICD-10-CM | POA: Insufficient documentation

## 2012-06-02 DIAGNOSIS — I209 Angina pectoris, unspecified: Secondary | ICD-10-CM | POA: Insufficient documentation

## 2012-06-02 DIAGNOSIS — R109 Unspecified abdominal pain: Secondary | ICD-10-CM | POA: Insufficient documentation

## 2012-06-02 DIAGNOSIS — Z95810 Presence of automatic (implantable) cardiac defibrillator: Secondary | ICD-10-CM | POA: Insufficient documentation

## 2012-06-02 DIAGNOSIS — I2510 Atherosclerotic heart disease of native coronary artery without angina pectoris: Principal | ICD-10-CM | POA: Insufficient documentation

## 2012-06-02 DIAGNOSIS — K861 Other chronic pancreatitis: Secondary | ICD-10-CM | POA: Insufficient documentation

## 2012-06-02 DIAGNOSIS — I255 Ischemic cardiomyopathy: Secondary | ICD-10-CM | POA: Diagnosis present

## 2012-06-02 LAB — COMPREHENSIVE METABOLIC PANEL
ALT: <5 U/L (ref 0–35)
Alkaline Phosphatase: 123 U/L — ABNORMAL HIGH (ref 39–117)
BUN: 19 mg/dL (ref 6–23)
Chloride: 97 mEq/L (ref 96–112)
GFR calc Af Amer: 18 mL/min — ABNORMAL LOW (ref >90)
Glucose, Bld: 118 mg/dL — ABNORMAL HIGH (ref 70–99)
Potassium: 3.7 mEq/L (ref 3.5–5.1)
Sodium: 134 mEq/L — ABNORMAL LOW (ref 135–145)
Total Bilirubin: 0.3 mg/dL (ref 0.3–1.2)

## 2012-06-02 LAB — CBC
HCT: 31 % — ABNORMAL LOW (ref 36.0–46.0)
Hemoglobin: 9.8 g/dL — ABNORMAL LOW (ref 12.0–15.0)
MCV: 87.3 fL (ref 78.0–100.0)
RBC: 3.55 MIL/uL — ABNORMAL LOW (ref 3.87–5.11)
WBC: 13.3 10*3/uL — ABNORMAL HIGH (ref 4.0–10.5)

## 2012-06-02 LAB — LIPASE, BLOOD: Lipase: 12 U/L (ref 11–59)

## 2012-06-02 MED ORDER — SIMVASTATIN 10 MG PO TABS
10.0000 mg | ORAL_TABLET | Freq: Every day | ORAL | Status: DC
  Filled 2012-06-02: qty 1

## 2012-06-02 MED ORDER — ONDANSETRON HCL 4 MG/2ML IJ SOLN
4.0000 mg | Freq: Once | INTRAMUSCULAR | Status: AC
  Administered 2012-06-02: 4 mg via INTRAVENOUS
  Filled 2012-06-02: qty 2

## 2012-06-02 MED ORDER — NEPRO/CARBSTEADY PO LIQD
237.0000 mL | Freq: Two times a day (BID) | ORAL | Status: DC
  Filled 2012-06-02 (×4): qty 237

## 2012-06-02 MED ORDER — HEPARIN SODIUM (PORCINE) 5000 UNIT/ML IJ SOLN
5000.0000 [IU] | Freq: Three times a day (TID) | INTRAMUSCULAR | Status: DC
  Filled 2012-06-02 (×4): qty 1

## 2012-06-02 MED ORDER — SODIUM CHLORIDE 0.9 % IV SOLN: 250.0000 mL | INTRAVENOUS | Status: DC | PRN

## 2012-06-02 MED ORDER — HYDROMORPHONE HCL 2 MG PO TABS
4.0000 mg | ORAL_TABLET | ORAL | Status: DC
  Administered 2012-06-03 (×4): 4 mg via ORAL
  Filled 2012-06-02 (×4): qty 2

## 2012-06-02 MED ORDER — ASPIRIN EC 81 MG PO TBEC
81.0000 mg | DELAYED_RELEASE_TABLET | Freq: Every day | ORAL | Status: DC
  Administered 2012-06-03: 81 mg via ORAL
  Filled 2012-06-02: qty 1

## 2012-06-02 MED ORDER — HYDRALAZINE HCL 25 MG PO TABS
25.0000 mg | ORAL_TABLET | Freq: Three times a day (TID) | ORAL | Status: DC
  Administered 2012-06-03: 25 mg via ORAL
  Filled 2012-06-02 (×5): qty 1

## 2012-06-02 MED ORDER — PROMETHAZINE HCL 25 MG/ML IJ SOLN
12.5000 mg | Freq: Four times a day (QID) | INTRAMUSCULAR | Status: DC | PRN
  Filled 2012-06-02: qty 1

## 2012-06-02 MED ORDER — PANTOPRAZOLE SODIUM 40 MG PO TBEC
40.0000 mg | DELAYED_RELEASE_TABLET | Freq: Every day | ORAL | Status: DC
  Administered 2012-06-03: 40 mg via ORAL
  Filled 2012-06-02: qty 1

## 2012-06-02 MED ORDER — ISOSORBIDE MONONITRATE ER 30 MG PO TB24
30.0000 mg | ORAL_TABLET | Freq: Every day | ORAL | Status: DC
  Administered 2012-06-03: 30 mg via ORAL
  Filled 2012-06-02: qty 1

## 2012-06-02 MED ORDER — SODIUM CHLORIDE 0.9 % IJ SOLN: 3.0000 mL | Freq: Two times a day (BID) | INTRAMUSCULAR | Status: DC

## 2012-06-02 MED ORDER — SODIUM CHLORIDE 0.9 % IJ SOLN: 3.0000 mL | INTRAMUSCULAR | Status: DC | PRN

## 2012-06-02 MED ORDER — RENA-VITE PO TABS
1.0000 | ORAL_TABLET | Freq: Every day | ORAL | Status: DC
  Administered 2012-06-03: 1 via ORAL
  Filled 2012-06-02: qty 1

## 2012-06-02 MED ORDER — VANCOMYCIN HCL 500 MG IV SOLR: 500.0000 mg | INTRAVENOUS | Status: DC

## 2012-06-02 MED ORDER — RANOLAZINE ER 500 MG PO TB12
500.0000 mg | ORAL_TABLET | Freq: Every day | ORAL | Status: DC
  Administered 2012-06-03: 500 mg via ORAL
  Filled 2012-06-02: qty 1

## 2012-06-02 MED ORDER — SEVELAMER CARBONATE 800 MG PO TABS
800.0000 mg | ORAL_TABLET | Freq: Three times a day (TID) | ORAL | Status: DC
  Administered 2012-06-03 (×2): 800 mg via ORAL
  Filled 2012-06-02 (×4): qty 1

## 2012-06-02 MED ORDER — MAGNESIUM OXIDE 420 MG PO TABS: 1.0000 | ORAL_TABLET | Freq: Every day | ORAL | Status: DC

## 2012-06-02 MED ORDER — METHADONE HCL 5 MG PO TABS
5.0000 mg | ORAL_TABLET | Freq: Three times a day (TID) | ORAL | Status: DC
  Administered 2012-06-03 (×2): 5 mg via ORAL
  Filled 2012-06-02 (×2): qty 1

## 2012-06-02 MED ORDER — CEFTAZIDIME 2 G IV SOLR: 2.0000 g | INTRAVENOUS | Status: DC | PRN

## 2012-06-02 MED ORDER — CARVEDILOL 6.25 MG PO TABS
6.2500 mg | ORAL_TABLET | Freq: Two times a day (BID) | ORAL | Status: DC
  Administered 2012-06-03: 6.25 mg via ORAL
  Filled 2012-06-02 (×3): qty 1

## 2012-06-02 MED ORDER — PROMETHAZINE HCL 12.5 MG PO TABS: 12.5000 mg | ORAL_TABLET | Freq: Four times a day (QID) | ORAL | Status: DC | PRN

## 2012-06-02 MED ORDER — DARBEPOETIN ALFA-POLYSORBATE 100 MCG/0.5ML IJ SOLN: 100.0000 ug | INTRAMUSCULAR | Status: DC

## 2012-06-02 MED ORDER — PROMETHAZINE HCL 25 MG RE SUPP: 12.5000 mg | Freq: Four times a day (QID) | RECTAL | Status: DC | PRN

## 2012-06-02 MED ORDER — NITROGLYCERIN 0.4 MG SL SUBL: 0.4000 mg | SUBLINGUAL_TABLET | SUBLINGUAL | Status: DC | PRN

## 2012-06-02 NOTE — ED Notes (Signed)
Per EMS: Pt from home c/o right sided CP radiating down right arm starting 1800 with SOB. Pt reports since cabg (unsure of date) she has had right sided pain. Given 2 nitro and 324 aspirin en route that relieved CP. Pt denies N/V/SOB at this time. Pt had dialysis today. Pt had dialysis port in left arm that was removed due to "her body rejecting it." Pt has right sided subclavian port. Hx pancreatitis. AO x 4.

## 2012-06-02 NOTE — ED Notes (Signed)
Admitting MD at bedside.

## 2012-06-02 NOTE — ED Notes (Signed)
MD Ray at bedside. 

## 2012-06-02 NOTE — H&P (Signed)
Hospital Admission Note Date: 06/03/2012  Patient name: [name] Medical record number: [id number] Date of birth: 1950/07/04 Age: 61 y.o. Gender: female PCP: Jyl Heinz, MD  Medical Service: Internal Medicine Teaching Service  Attending physician:  Dr. Dalphine Handing   1st Contact: Dr. Shirlee Latch    [phone] 2nd Contact: Dr. Everardo Beals    Pager:[phone] After 5 pm or weekends: 1st Contact:      Pager: [phone] 2nd Contact:      Pager: [phone]  Chief Complaint: Chest pain  History of Present Illness:  [name] is a 61 year old AA woman with PMH significant for ICM with EF of 10% per cath in 11/13 s/p CABG in 2008 and ICD implantation in 1/13, chronic pancreatitis with pancreatic pseudosyct, ESRD with HD on T/Th/Sat who presents to the [facility] ED for evaluation of chest pain. She was discharged from [facility] yesterday after she had her AVG revision. On the night of her admission she reports that she was in the bathroom, having just finished voiding,  when she suddenly experienced right sided chest pain. The pain is described as pressure in right chest with sharp pain, 10/10 in intensity, radiating down her right arm and with associated SOB. She denies nausea, vomiting, of diaphoresis associated with the pain. The pain lasted less than one hour and subsided only after EMS had given her a second dose of nitroglycerin. She states that she had expired Nitroglycerin tablets at home therefore she did not take it.   She denies orthopnea or PND, diarrhea, constipation, or dysuria (she voids 4 x daily). Her last BM was non-bloody and one day ago. She has nausea secondary to aspirin intake today. She has chronic mid epigastric pain from her chronic pancreatitis but otherwise denies pain anywhere else in her body at this time.     Meds: Medications Prior to Admission  Medication Sig Dispense Refill  . aspirin EC 81 MG EC tablet Take 1 tablet (81 mg total) by mouth daily.  30 tablet  0  .  carvedilol (COREG) 6.25 MG tablet Take 1 tablet (6.25 mg total) by mouth 2 (two) times daily with a meal.  60 tablet  3  . Ceftazidime (FORTAZ) 2 G SOLR Inject 2 g into the vein every hemodialysis (Tuesday, Thursday, Saturday for 5 more doses ).  5 each  0  . darbepoetin (ARANESP) 100 MCG/0.5ML SOLN Inject 100 mcg into the vein See admin instructions. In dialysis      . hydrALAZINE (APRESOLINE) 25 MG tablet Take 1 tablet (25 mg total) by mouth every 8 (eight) hours.  90 tablet  3  . HYDROmorphone (DILAUDID) 2 MG tablet Take 1 mg by mouth every 2 (two) hours as needed. For pain      . HYDROmorphone (DILAUDID) 4 MG tablet Take 1 tablet (4 mg total) by mouth every 4 (four) hours.  84 tablet  0  . isosorbide mononitrate (IMDUR) 30 MG 24 hr tablet Take 1 tablet (30 mg total) by mouth daily.  30 tablet  3  . Magnesium Oxide 420 MG TABS Take 1 tablet by mouth daily.      . methadone (DOLOPHINE) 5 MG tablet Take 1 tablet (5 mg total) by mouth 3 (three) times daily. Takes along with Dilaudid.  42 tablet  0  . multivitamin (RENA-VIT) TABS tablet Take 1 tablet by mouth daily.      . nitroGLYCERIN (NITROSTAT) 0.4 MG SL tablet Place 1 tablet (0.4 mg total) under the tongue every  5 (five) minutes as needed for chest pain (CP or SOB).  30 tablet  0  . Nutritional Supplements (FEEDING SUPPLEMENT, NEPRO CARB STEADY,) LIQD Take 237 mLs by mouth 2 (two) times daily with breakfast and lunch.      . pantoprazole (PROTONIX) 40 MG tablet Take 40 mg by mouth daily.      . pravastatin (PRAVACHOL) 20 MG tablet Take 20 mg by mouth at bedtime.       . ranolazine (RANEXA) 500 MG 12 hr tablet Take 500 mg by mouth daily.       . sevelamer (RENAGEL) 400 MG tablet Take 400 mg by mouth daily. With a meal      . sodium chloride 0.9 % SOLN 100 mL with vancomycin 500 MG SOLR 500 mg Inject 500 mg into the vein Every Tuesday,Thursday,and Saturday with dialysis.  5 each  0   Allergies: Allergies as of 06/02/2012 - Review Complete  06/02/2012  Allergen Reaction Noted  . Codeine Itching 12/01/2010  . Morphine and related Shortness Of Breath 12/01/2010  . Ace inhibitors Other (See Comments) 04/24/2012  . Tylenol (acetaminophen) Other (See Comments) 04/21/2012   Past Medical History  Diagnosis Date  . Gallstone pancreatitis     chronic pancreatitis s/p resection of gallbladder and panreas  . Coronary artery disease     s/p CABG 2008 with multiple PCIs  . Ischemic cardiomyopathy     Cath 04/2012, significant calcifications  . History of nonadherence to medical treatment   . Hyperlipidemia   . Systolic CHF     78/2956 echo EF 10%  . Esophageal varices   . Bacteremia     enterobacter 09/2011, MRSA 09/2011  . Fatty liver   . GIB (gastrointestinal bleeding)     per pt history   . 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 Perforated bowel     09/2011-no surgical intervention  . Pacemaker   . ICD (implantable cardiac defibrillator) in place   . Hypertension     "used to have HTN; now I'm low" (05/23/2012)  . CHF (congestive heart failure)   . Heart murmur     "just found that out this year" (05/23/2012)  . NSTEMI (non-ST elevated myocardial infarction)     04/2012; Trop peaked to 1.39; 05/23/2012 pt denies ever having MI  . Pneumonia 02/2012    "first time ever" (05/23/2012)  . Chronic bronchitis 1970's thru 2002    "went away when I stopped smoking in 2002" (05/23/2012)  . Diabetes mellitus     Diet controlled  . History of blood transfusion     "alot of them" (05/23/2012)  . End stage renal disease     T,Th,Sat HD by [facility] on [facility] (05/23/2012); h/o peritoneal dialysis prior; uses right chest cath for HD, left arm graft placed by [facility] but not ready for use  . Iron (Fe) deficiency anemia     "severe" (05/23/2012)  . Fatty liver     with cirrhosis and esophageal varices    Past Surgical History  Procedure Date  . Esophagogastroduodenoscopy 09/15/2011    Procedure:  ESOPHAGOGASTRODUODENOSCOPY (EGD);  Surgeon: Theda Belfast, MD;  Location: [facility] ENDOSCOPY;  Service: Endoscopy;  Laterality: N/A;  . Cholecystectomy 2003  . Tonsillectomy and adenoidectomy 1961  . Appendectomy 1973?  [name] Vaginal hysterectomy 1973?  [name] Tubal ligation 1972  . Dilation and curettage of uterus     "bunch from profuse bleeding in the 1970's" (05/23/2012)  .  Coronary artery bypass graft 2008    CABG X4  . Coronary angioplasty with stent placement 2008    "1; day after CABG" (05/23/2012)  . Cardiac catheterization     "before 2008 and 3 wk ago" (05/23/2012)  . Insert / replace / remove pacemaker 06/2011    pacemaker ICD  . Cardiac defibrillator placement 06/2011  . Arteriovenous graft placement 03/2012    right antecub  . Insertion of dialysis catheter ~ 10/2011    right chest  . Peritoneal catheter insertion 08/2011  . Peritoneal catheter removal ~ 10/2011   Family History  Problem Relation Age of Onset  . Coronary artery disease Mother   . Hypertension Mother   . Diabetes Mother   . Diabetes Sister   . Anesthesia problems Neg Hx    History   Social History  . Marital Status: Single    Spouse Name: N/A    Number of Children: N/A  . Years of Education: N/A   Occupational History  . Not on file.   Social History Main Topics  . Smoking status: Former Smoker -- 0.3 packs/day for 31 years    Types: Cigarettes    Quit date: 06/15/2000  . Smokeless tobacco: Never Used  . Alcohol Use: Yes     Comment: 05/23/2012 "drank from age [age]; never had problem w/it"  . Drug Use: No  . Sexually Active: Yes   Other Topics Concern  . Not on file   Social History Narrative   The patient lives alone in [place].  She has a fiance.  She is a retired nurse-Psychiatric and ortho.  She is disabled and receives most of her care from the [facility] [place].  She was a Charity fundraiser in [employer].  She has 2 kids (boy, girl).  Denies alcohol, cigarettes, drugs.  She is on Methadone via Palliative Care.   Son-shawn-[phone] alone    Review of Systems: Constitutional: Denies fever, chills, diaphoresis, appetite change and fatigue.  HEENT: Denies photophobia, eye pain, redness, hearing loss, ear pain, congestion, sore throat, rhinorrhea, sneezing, mouth sores, trouble swallowing, neck pain, neck stiffness and tinnitus.  Respiratory: Denies DOE, cough, chest tightness, and wheezing. +SOB Cardiovascular: Denies palpitations and leg swelling. +chest pain Gastrointestinal: Denies nausea, vomiting, diarrhea, constipation, blood in stool and abdominal distention. +abdominal pain Genitourinary: Denies dysuria, urgency, frequency, hematuria, flank pain and difficulty urinating.  Musculoskeletal: Denies myalgias, back pain, joint swelling, arthralgias and gait problem.  Skin: Denies pallor, rash and wound.  Neurological: Denies dizziness, seizures, syncope, weakness, light-headedness, numbness and headaches.  Hematological: Denies adenopathy. Easy bruising, personal or family bleeding history  Psychiatric/Behavioral: Denies suicidal ideation, mood changes, confusion, nervousness, sleep disturbance and agitation  Physical Exam: Blood pressure 126/72, pulse 87, temperature 98 F (36.7 C), temperature source Oral, resp. rate 18, SpO2 97.00%. Vitals reviewed. General: Sitting in bed, in NAD HEENT: PERRL, EOMI, no scleral icterus, wearing glasses Neck: Supple with no TTP, full ROM Chest: Well healed CABG scar, right HD cath in place with no surrounding edema or erythema, no TTP over right chest.  Cardiac: RRR, no rubs, 2/6 murmur heard best at the  Pulm: Fine crackles up to her mid lung fields bilaterally, no wheezes, rales, or rhonchi Abd: soft, nondistended, hypoactive bowel sounds, mid epigastric tenderness with deep palpation Ext: warm and well perfused, 1+ pitting edema bilaterally up to her knees. Left arm covered with ACE bandage from deltoid to tip of fingers. Right arm with full ROM and  no tenderness to  palpation.  Neuro: alert and oriented X3, cranial nerves II-XII grossly intact, strength and sensation to light touch equal in bilateral upper and lower extremities Psych: Mood is "good", with congruent affect, she jokes at times, calling her pseudocyst "Joe"    Lab results: Basic Metabolic Panel:  [facility] 06/02/12 1938  NA 134*  K 3.7  CL 97  CO2 26  GLUCOSE 118*  BUN 19  CREATININE 2.96*  CALCIUM 10.0  MG --  PHOS --   Liver Function Tests:  [facility] 06/02/12 1938  AST 15  ALT <5  ALKPHOS 123*  BILITOT 0.3  PROT 6.9  ALBUMIN 2.0*    Basename 06/02/12 1938  LIPASE 12  AMYLASE --   CBC:  Basename 06/02/12 1938  WBC 13.3*  NEUTROABS --  HGB 9.8*  HCT 31.0*  MCV 87.3  PLT 204   BNP:  Basename 06/02/12 1944  PROBNP >70000.0*   Imaging results:  Dg Chest Port 1 View  06/02/2012  *RADIOLOGY REPORT*  Clinical Data: Chest pain, shortness of breath  PORTABLE CHEST - 1 VIEW  Comparison: 05/22/2012  Findings: Low lung volumes with vascular crowding.  No frank interstitial edema. No pleural effusion or pneumothorax.  Cardiomegaly.  Postsurgical changes related to prior CABG.  Left subclavian ICD.  Stable right IJ venous catheter.  IMPRESSION: No evidence of acute cardiopulmonary disease.   Original Report Authenticated By: Charline Bills, M.D.     Other results: EKG: NSR, normal PR interval, prolonged QTc of , Left bundle branch block (seen in previous tracings) left axis deviation, no new ST elevation (elevation in leads V1-V3 as seen in previous tracings).   Assessment & Plan by Problem: 1. Chest pain: likely cardiac in origin given that patient has not been taking her medications since hospital discharge (typical angina with symptoms of pressure, radiation to arm, relief with NTG). Chest pain was resolved after 2 Nitroglycerin administrations. She does not have any acute ST changes and her POC troponin x1 is negative TIMI score is 3, her  risk score is13% meaning her risk in 14 days of having CV related event or death is 13%. She has had CABG in 2008 and restenosis of graft 6 months after the procedure. She did have NSTEMI in November with subsequent cardiac cath which showed LAD with 70% diffuse proximal narrowing prior to patent stent; intermediate vessels 50% proximal stenosis follow by 60% stenosis at bifurcations. Circumflex 80% proximal AV grove stenosis and 80% stenosis in marginal vessel; RCA diffuse 80% prox stenosis as well as ostial 60 and 90% stenosis in anterior RV marginal branches.  Based on her previous cath, medical therapy was recommended by cardiology.  Given her extensive cardiac history, will need to rule out ACS.   Plan  -Admit to telemetry -She will continued on Imdur and Ranexa, Coreg home doses, continue NTG prn, Continue ASA  -Admit for observation -Will cycle cardiac enzymes x3 q6hr.   -Repeat EKG in AM  - She requests printed prescription for her Nitroglycerin as she currently has her prescriptions filled at the [place] in [place].  2. Leukocytosis: during last admission, there was a concern for complex infected pseudoocyst with pancreatic necrosis and fever of unknown origin and she was placed on Vanc and Ceftaz with HD  and Flagyl. She is supposed to finish Vanc and Ceftaz on 12/24 (total of 2 weeks) but she states that she has not been taking Flagyl PO.  -WBC 13.3 (last WBC was 8.9 on 05/26/12). Currently on IV abx:  vanc and ceftaz during dialysis. She is afebrile   3. Hypertension: well-controlled  -Continue Imdur, Hydralazine, and Coreg   4. ESRD on HD TThSat : had HD today.  Will inform Neprhology that she is being admitted.  5. Normocytic anemia - baseline between 9-10. -H/H 9.8/31 likely related to ESRD. Iron was low, ferritin was high in 02/2012.   DVT Px  -Heparin  Dispo: Disposition is deferred at this time, awaiting improvement of current medical problems. Anticipated discharge in  approximately 1-2 day(s).   The patient does have a current PCP (ARMOUR, ROSS B, MD), therefore will not be requiring OPC follow-up after discharge.   The patient does not have transportation limitations that hinder transportation to clinic appointments.  Signed: Ky Barban 06/03/2012, 12:38 AM

## 2012-06-02 NOTE — ED Provider Notes (Addendum)
History     CSN: [id number]  Arrival date & time 06/02/12  1905   First MD Initiated Contact with Patient 06/02/12 1923      Chief Complaint  Patient presents with  . Chest Pain    (Consider location/radiation/quality/duration/timing/severity/associated sxs/prior treatment) HPI  61 y.o. Female s/p cabg, on dialysis with chest pain today began about 1645 right side of chest like pain before cabg, sharp pain radiates to thumb.  Pain worst 10/10 with associated dyspnea, no dyspnea or lightheadedness, but weak.  Patient given aspirin and nitro x 2 with relief of symptoms.  Patient with several admissions for same recently.  Patient just discharged yesterday after shunt revision, dialysis t,th, and Saturday with dialysis done today.    Past Medical History  Diagnosis Date  . Gallstone pancreatitis     chronic pancreatitis s/p resection of gallbladder and panreas  . Coronary artery disease     s/p CABG 2008 with multiple PCIs  . Ischemic cardiomyopathy     Cath 04/2012, significant calcifications  . History of nonadherence to medical treatment   . Hyperlipidemia   . Systolic CHF     14/7829 echo EF 10%  . Esophageal varices   . Bacteremia     enterobacter 09/2011, MRSA 09/2011  . Fatty liver   . GIB (gastrointestinal bleeding)     per pt history   . 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 Perforated bowel     09/2011-no surgical intervention  . Pacemaker   . ICD (implantable cardiac defibrillator) in place   . Hypertension     "used to have HTN; now I'm low" (05/23/2012)  . CHF (congestive heart failure)   . Heart murmur     "just found that out this year" (05/23/2012)  . NSTEMI (non-ST elevated myocardial infarction)     04/2012; Trop peaked to 1.39; 05/23/2012 pt denies ever having MI  . Pneumonia 02/2012    "first time ever" (05/23/2012)  . Chronic bronchitis 1970's thru 2002    "went away when I stopped smoking in 2002" (05/23/2012)  . Diabetes mellitus     Diet  controlled  . History of blood transfusion     "alot of them" (05/23/2012)  . End stage renal disease     T,Th,Sat HD by [facility] (05/23/2012); h/o peritoneal dialysis prior; uses right chest cath for HD, left arm graft placed by [facility] but not ready for use  . Iron (Fe) deficiency anemia     "severe" (05/23/2012)  . Fatty liver     with cirrhosis and esophageal varices     Past Surgical History  Procedure Date  . Esophagogastroduodenoscopy 09/15/2011    Procedure: ESOPHAGOGASTRODUODENOSCOPY (EGD);  Surgeon: Theda Belfast, MD;  Location: [facility] ENDOSCOPY;  Service: Endoscopy;  Laterality: N/A;  . Cholecystectomy 2003  . Tonsillectomy and adenoidectomy 1961  . Appendectomy 1973?  [name] Vaginal hysterectomy 1973?  [name] Tubal ligation 1972  . Dilation and curettage of uterus     "bunch from profuse bleeding in the 1970's" (05/23/2012)  . Coronary artery bypass graft 2008    CABG X4  . Coronary angioplasty with stent placement 2008    "1; day after CABG" (05/23/2012)  . Cardiac catheterization     "before 2008 and 3 wk ago" (05/23/2012)  . Insert / replace / remove pacemaker 06/2011    pacemaker ICD  . Cardiac defibrillator placement 06/2011  . Arteriovenous graft placement 03/2012  right antecub  . Insertion of dialysis catheter ~ 10/2011    right chest  . Peritoneal catheter insertion 08/2011  . Peritoneal catheter removal ~ 10/2011    Family History  Problem Relation Age of Onset  . Coronary artery disease Mother   . Hypertension Mother   . Diabetes Mother   . Diabetes Sister   . Anesthesia problems Neg Hx     History  Substance Use Topics  . Smoking status: Former Smoker -- 0.3 packs/day for 31 years    Types: Cigarettes    Quit date: 06/15/2000  . Smokeless tobacco: Never Used  . Alcohol Use: Yes     Comment: 05/23/2012 "drank from age [age]; never had problem w/it"    OB History    Grav Para Term Preterm Abortions TAB SAB Ect Mult Living                   Review of Systems  Constitutional: Positive for unexpected weight change. Negative for fever, chills, activity change and appetite change.  HENT: Negative for sore throat, rhinorrhea, neck pain, neck stiffness and sinus pressure.   Eyes: Negative for visual disturbance.  Respiratory: Negative for cough and shortness of breath.   Cardiovascular: Positive for chest pain. Negative for leg swelling.  Gastrointestinal: Negative for vomiting, abdominal pain, diarrhea and blood in stool.  Genitourinary: Negative for dysuria, urgency, frequency, vaginal discharge and difficulty urinating.  Musculoskeletal: Negative for myalgias, arthralgias and gait problem.  Skin: Negative for color change and rash.  Neurological: Negative for weakness, light-headedness and headaches.  Hematological: Does not bruise/bleed easily.  Psychiatric/Behavioral: Negative for dysphoric mood.    Allergies  Codeine; Morphine and related; Ace inhibitors; and Tylenol  Home Medications   Current Outpatient Rx  Name  Route  Sig  Dispense  Refill  . ASPIRIN 81 MG PO TBEC   Oral   Take 1 tablet (81 mg total) by mouth daily.   30 tablet   0   . CARVEDILOL 6.25 MG PO TABS   Oral   Take 1 tablet (6.25 mg total) by mouth 2 (two) times daily with a meal.   60 tablet   3   . CEFTAZIDIME 2 G IV SOLR   Intravenous   Inject 2 g into the vein every hemodialysis (Tuesday, Thursday, Saturday for 5 more doses ).   5 each   0   . DARBEPOETIN ALFA-POLYSORBATE 100 MCG/0.5ML IJ SOLN   Intravenous   Inject 100 mcg into the vein See admin instructions. In dialysis         . HYDRALAZINE HCL 25 MG PO TABS   Oral   Take 1 tablet (25 mg total) by mouth every 8 (eight) hours.   90 tablet   3   . HYDROMORPHONE HCL 2 MG PO TABS   Oral   Take 0.5 tablets (1 mg total) by mouth every 2 (two) hours as needed.   168 tablet   0   . HYDROMORPHONE HCL 4 MG PO TABS   Oral   Take 1 tablet (4 mg total) by mouth every 4  (four) hours.   84 tablet   0   . ISOSORBIDE MONONITRATE ER 30 MG PO TB24   Oral   Take 1 tablet (30 mg total) by mouth daily.   30 tablet   3   . MAGNESIUM OXIDE 420 MG PO TABS   Oral   Take 1 tablet by mouth daily.         [name]  METHADONE HCL 5 MG PO TABS   Oral   Take 1 tablet (5 mg total) by mouth 3 (three) times daily. Takes along with Dilaudid.   42 tablet   0   . METRONIDAZOLE 500 MG PO TABS   Oral   Take 1 tablet (500 mg total) by mouth every 8 (eight) hours.   12 tablet   0   . RENA-VITE PO TABS   Oral   Take 1 tablet by mouth daily.         [name] NITROGLYCERIN 0.4 MG SL SUBL   Sublingual   Place 1 tablet (0.4 mg total) under the tongue every 5 (five) minutes as needed for chest pain (CP or SOB).   30 tablet   0   . NEPRO/CARB STEADY PO LIQD   Oral   Take 237 mLs by mouth 2 (two) times daily with breakfast and lunch.         [name] PANTOPRAZOLE SODIUM 40 MG PO TBEC   Oral   Take 40 mg by mouth daily.         [name] PRAVASTATIN SODIUM 20 MG PO TABS   Oral   Take 20 mg by mouth at bedtime.          [name] RANOLAZINE ER 500 MG PO TB12   Oral   Take 500 mg by mouth daily.          Bernadette Hoit SODIUM 8.6-50 MG PO TABS   Oral   Take 1 tablet by mouth 2 (two) times daily.   60 tablet   3   . SEVELAMER HCL 400 MG PO TABS   Oral   Take 400 mg by mouth daily. With a meal         . VANCOMYCIN 500 MG IVPB (HEMODIALYSIS)   Intravenous   Inject 500 mg into the vein Every Tuesday,Thursday,and Saturday with dialysis.   5 each   0   . SULFAMETHOXAZOLE-TRIMETHOPRIM 800-160 MG PO TABS   Oral   Take 1 tablet by mouth once.   30 tablet   2     BP 125/82  Pulse 97  Temp 98.7 F (37.1 C) (Oral)  Resp 16  SpO2 99%  Physical Exam  Nursing note and vitals reviewed. Constitutional: She appears well-developed and well-nourished.  HENT:  Head: Normocephalic and atraumatic.  Eyes: Conjunctivae normal and EOM are normal. Pupils are equal, round, and  reactive to light.  Neck: Normal range of motion. Neck supple.  Cardiovascular: Normal rate, regular rhythm, normal heart sounds and intact distal pulses.   Pulmonary/Chest: Effort normal and breath sounds normal.       Right chest catheter  Abdominal: Soft. Bowel sounds are normal. There is tenderness.       Mild diffuse tenderness  Musculoskeletal: Normal range of motion.       Left arm with dressing place  Neurological: She is alert.  Skin: Skin is warm and dry.  Psychiatric: She has a normal mood and affect. Thought content normal.    ED Course  Procedures (including critical care time)   Labs Reviewed  CBC  BASIC METABOLIC PANEL  PRO B NATRIURETIC PEPTIDE   No results found.   No diagnosis found. Results for orders placed during the hospital encounter of 06/02/12  CBC      Component Value Range   WBC 13.3 (*) 4.0 - 10.5 K/uL   RBC 3.55 (*) 3.87 - 5.11 MIL/uL   Hemoglobin 9.8 (*) 12.0 - 15.0  g/dL   HCT 45.4 (*) 09.8 - 11.9 %   MCV 87.3  78.0 - 100.0 fL   MCH 27.6  26.0 - 34.0 pg   MCHC 31.6  30.0 - 36.0 g/dL   RDW 14.7 (*) 82.9 - 56.2 %   Platelets 204  150 - 400 K/uL  PRO B NATRIURETIC PEPTIDE      Component Value Range   Pro B Natriuretic peptide (BNP) >70000.0 (*) 0 - 125 pg/mL  COMPREHENSIVE METABOLIC PANEL      Component Value Range   Sodium 134 (*) 135 - 145 mEq/L   Potassium 3.7  3.5 - 5.1 mEq/L   Chloride 97  96 - 112 mEq/L   CO2 26  19 - 32 mEq/L   Glucose, Bld 118 (*) 70 - 99 mg/dL   BUN 19  6 - 23 mg/dL   Creatinine, Ser 1.30 (*) 0.50 - 1.10 mg/dL   Calcium 86.5  8.4 - 78.4 mg/dL   Total Protein 6.9  6.0 - 8.3 g/dL   Albumin 2.0 (*) 3.5 - 5.2 g/dL   AST 15  0 - 37 U/L   ALT <5  0 - 35 U/L   Alkaline Phosphatase 123 (*) 39 - 117 U/L   Total Bilirubin 0.3  0.3 - 1.2 mg/dL   GFR calc non Af Amer 16 (*) >90 mL/min   GFR calc Af Amer 18 (*) >90 mL/min  LIPASE, BLOOD      Component Value Range   Lipase 12  11 - 59 U/L  POCT I-STAT TROPONIN I       Component Value Range   Troponin i, poc 0.03  0.00 - 0.08 ng/mL   Comment 3            Dg Chest 2 View 06/02/2012  *RADIOLOGY REPORT*  Clinical Data: Chest pain, shortness of breath  PORTABLE CHEST - 1 VIEW  Comparison: 05/22/2012  Findings: Low lung volumes with vascular crowding.  No frank interstitial edema. No pleural effusion or pneumothorax.  Cardiomegaly.  Postsurgical changes related to prior CABG.  Left subclavian ICD.  Stable right IJ venous catheter.  IMPRESSION: No evidence of acute cardiopulmonary disease.   Original Report Authenticated By: Charline Bills, M.D.    Dg Chest Port 1 View  05/05/2012  *RADIOLOGY REPORT*  Clinical Data:  Chest pain  PORTABLE CHEST - 1 VIEW  Comparison: Portable exam 2120 hours compared to 04/21/2012  Findings: Right jugular dual-lumen central venous catheter tip projecting over right atrium. Left subclavian transvenous pacemaker / AICD leads project over right atrium, right ventricle, and coronary sinus. Enlargement of cardiac silhouette post CABG. Pulmonary vascular ingestion. Mild perihilar infiltrates suspect edema. No gross pleural effusion or pneumothorax.  IMPRESSION: Enlargement of cardiac silhouette with pulmonary vascular congestion post CABG and pacemaker / AICD. Probable perihilar edema.   Original Report Authenticated By: Ulyses Southward, M.D.    . Date: 06/02/2012  Rate: 99  Rhythm: normal sinus rhythm  QRS Axis: left  Intervals: lbb  ST/T Wave abnormalities: nonspecific ST/T changes  Conduction Disutrbances:left bundle branch block  Narrative Interpretation:   Old EKG Reviewed: 12/8/20130 rate has increased by 20 b/m    MDM     With known non-STEMI in November who presents today with return of chest pain relieved by nitroglycerin. Patient has left bundle branch block on EKG. Initial troponins are normal. Patient also has renal failure and is on dialysis. Patient was recently discharged from teaching service and they will be  consulted  for readmission for rule out.    Discussed with Dr. Anselm Jungling.   Hilario Quarry, MD 06/02/12 [id number]  Hilario Quarry, MD 06/02/12 [id number]  Hilario Quarry, MD 06/02/12 [phone]

## 2012-06-03 ENCOUNTER — Encounter: Payer: Self-pay | Admitting: Surgery

## 2012-06-03 ENCOUNTER — Encounter (HOSPITAL_COMMUNITY): Payer: Self-pay | Admitting: *Deleted

## 2012-06-03 DIAGNOSIS — K861 Other chronic pancreatitis: Secondary | ICD-10-CM

## 2012-06-03 DIAGNOSIS — R079 Chest pain, unspecified: Secondary | ICD-10-CM | POA: Diagnosis present

## 2012-06-03 DIAGNOSIS — Z992 Dependence on renal dialysis: Secondary | ICD-10-CM

## 2012-06-03 DIAGNOSIS — I209 Angina pectoris, unspecified: Secondary | ICD-10-CM

## 2012-06-03 LAB — CBC WITH DIFFERENTIAL/PLATELET
Hemoglobin: 9.4 g/dL — ABNORMAL LOW (ref 12.0–15.0)
Lymphs Abs: 0.9 10*3/uL (ref 0.7–4.0)
MCH: 27.9 pg (ref 26.0–34.0)
Monocytes Relative: 6 % (ref 3–12)
Neutro Abs: 9.8 10*3/uL — ABNORMAL HIGH (ref 1.7–7.7)
Neutrophils Relative %: 85 % — ABNORMAL HIGH (ref 43–77)
RBC: 3.37 MIL/uL — ABNORMAL LOW (ref 3.87–5.11)

## 2012-06-03 LAB — RENAL FUNCTION PANEL
BUN: 23 mg/dL (ref 6–23)
Creatinine, Ser: 3.46 mg/dL — ABNORMAL HIGH (ref 0.50–1.10)
Glucose, Bld: 119 mg/dL — ABNORMAL HIGH (ref 70–99)
Phosphorus: 3.7 mg/dL (ref 2.3–4.6)
Potassium: 4.3 mEq/L (ref 3.5–5.1)

## 2012-06-03 LAB — TROPONIN I
Troponin I: <0.3 ng/mL (ref <0.30)
Troponin I: <0.3 ng/mL (ref <0.30)

## 2012-06-03 MED ORDER — METRONIDAZOLE 500 MG PO TABS: 500.0000 mg | ORAL_TABLET | Freq: Three times a day (TID) | ORAL | Status: DC

## 2012-06-03 MED ORDER — MAGNESIUM OXIDE 400 (241.3 MG) MG PO TABS
400.0000 mg | ORAL_TABLET | Freq: Every day | ORAL | Status: DC
  Filled 2012-06-03: qty 1

## 2012-06-03 MED ORDER — NITROGLYCERIN 0.4 MG SL SUBL: 0.4000 mg | SUBLINGUAL_TABLET | SUBLINGUAL | Status: DC | PRN

## 2012-06-03 MED ORDER — METRONIDAZOLE 500 MG PO TABS: 500.0000 mg | ORAL_TABLET | Freq: Three times a day (TID) | ORAL | Status: AC

## 2012-06-03 MED ORDER — SULFAMETHOXAZOLE-TRIMETHOPRIM 800-160 MG PO TABS: 1.0000 | ORAL_TABLET | Freq: Two times a day (BID) | ORAL | Status: DC

## 2012-06-03 MED ORDER — DEXTROSE 5 % IV SOLN: 2.0000 g | INTRAVENOUS | Status: DC

## 2012-06-03 NOTE — Discharge Summary (Signed)
Internal Medicine Teaching [facility] Discharge Note  Name: [name] MRN: [id number] DOB: 03/13/1951 61 y.o.  Date of Admission: 06/02/2012  7:05 PM Date of Discharge: 06/03/2012 Attending Physician: Aletta Edouard, MD  Discharge Diagnosis: 1. *Chest pain secondary to angina 2. Leukocytosis  3. Pancreatitis, chronic 4. ESRD on hemodialysis TThSat  Discharge Medications:   Medication List     As of 06/03/2012 11:12 AM    TAKE these medications         aspirin 81 MG EC tablet   Take 1 tablet (81 mg total) by mouth daily.      carvedilol 6.25 MG tablet   Commonly known as: COREG   Take 1 tablet (6.25 mg total) by mouth 2 (two) times daily with a meal.      Ceftazidime 2 G Solr   Commonly known as: FORTAZ   Inject 2 g into the vein every hemodialysis (Tuesday, Thursday, Saturday for 5 more doses ).      darbepoetin 100 MCG/0.5ML Soln   Commonly known as: ARANESP   Inject 100 mcg into the vein See admin instructions. In dialysis      feeding supplement (NEPRO CARB STEADY) Liqd   Take 237 mLs by mouth 2 (two) times daily with breakfast and lunch.      hydrALAZINE 25 MG tablet   Commonly known as: APRESOLINE   Take 1 tablet (25 mg total) by mouth every 8 (eight) hours.      HYDROmorphone 4 MG tablet   Commonly known as: DILAUDID   Take 1 tablet (4 mg total) by mouth every 4 (four) hours.      HYDROmorphone 2 MG tablet   Commonly known as: DILAUDID   Take 1 mg by mouth every 2 (two) hours as needed. For pain      isosorbide mononitrate 30 MG 24 hr tablet   Commonly known as: IMDUR   Take 1 tablet (30 mg total) by mouth daily.      Magnesium Oxide 420 MG Tabs   Take 1 tablet by mouth daily.      methadone 5 MG tablet   Commonly known as: DOLOPHINE   Take 1 tablet (5 mg total) by mouth 3 (three) times daily. Takes along with Dilaudid.      multivitamin Tabs tablet   Take 1 tablet by mouth daily.      nitroGLYCERIN 0.4 MG SL tablet   Commonly  known as: NITROSTAT   Place 1 tablet (0.4 mg total) under the tongue every 5 (five) minutes as needed for chest pain (CP or SOB).      pantoprazole 40 MG tablet   Commonly known as: PROTONIX   Take 40 mg by mouth daily.      pravastatin 20 MG tablet   Commonly known as: PRAVACHOL   Take 20 mg by mouth at bedtime.      ranolazine 500 MG 12 hr tablet   Commonly known as: RANEXA   Take 500 mg by mouth daily.      sevelamer 400 MG tablet   Commonly known as: RENAGEL   Take 400 mg by mouth daily. With a meal      sodium chloride 0.9 % SOLN 100 mL with vancomycin 500 MG SOLR 500 mg   Inject 500 mg into the vein Every Tuesday,Thursday,and Saturday with dialysis.          Disposition and follow-up:   DorothyDorothy [name] was discharged from [facility] in stable  condition.  At the hospital follow up visit please address  1) Compliance with medications 2) Pain control 3) Signs and symptoms of chest pain to be concerned 4) Repeat CBC 5) social work for financial stressors   Follow-up Appointments:  Discharge Orders    Future Appointments: Provider: Department: Dept Phone: Center:   06/06/2012 3:30 PM Nada Libman, MD [facility] [phone] [facility]   06/20/2012 2:00 PM Randall Hiss, MD [facility] [phone] [facility]     Future Orders Please Complete By Expires   Diet - low sodium heart healthy      Increase activity slowly      Discharge instructions      Comments:   Dr. Yaakov Guthrie office will reschedule your follow up appointment today.  Please follow up Please get your prescriptions from [facility] especially Nitroglycerin Please [facility] antibiotics [facility]      Consultations: none Procedures Performed:  Dg Chest Port 1 View  06/02/2012  *RADIOLOGY REPORT*  Clinical Data: Chest pain, shortness of breath  PORTABLE CHEST - 1 VIEW  Comparison: 05/22/2012  Findings: Low lung  volumes with vascular crowding.  No frank interstitial edema. No pleural effusion or pneumothorax.  Cardiomegaly.  Postsurgical changes related to prior CABG.  Left subclavian ICD.  Stable right IJ venous catheter.  IMPRESSION: No evidence of acute cardiopulmonary disease.   Original Report Authenticated By: Charline Bills, M.D.     Admission HPI:  Chief Complaint: Chest pain  History of Present Illness:  [name] is a 61 year old AA woman with PMH significant for ICM with EF of 10% per cath in 11/13 s/p CABG in 2008 and ICD implantation in 1/13, chronic pancreatitis with pancreatic pseudosyct, ESRD with HD on T/Th/Sat who presents to the [facility] ED for evaluation of chest pain. She was discharged from [facility] yesterday after she had her AVG revision. On the night of her admission she reports that she was in the bathroom, having just finished voiding, when she suddenly experienced right sided chest pain. The pain is described as pressure in right chest with sharp pain, 10/10 in intensity, radiating down her right arm and with associated SOB. She denies nausea, vomiting, of diaphoresis associated with the pain. The pain lasted less than one hour and subsided only after EMS had given her a second dose of nitroglycerin. She states that she had expired Nitroglycerin tablets at home therefore she did not take it.  She denies orthopnea or PND, diarrhea, constipation, or dysuria (she voids 4 x daily). Her last BM was non-bloody and one day ago. She has nausea secondary to aspirin intake today. She has chronic mid epigastric pain from her chronic pancreatitis but otherwise denies pain anywhere else in her body at this time.   Review of Systems:  Constitutional: Denies fever, chills, diaphoresis, appetite change and fatigue.  HEENT: Denies photophobia, eye pain, redness, hearing loss, ear pain, congestion, sore throat, rhinorrhea, sneezing, mouth sores, trouble swallowing, neck pain, neck stiffness and  tinnitus.  Respiratory: Denies DOE, cough, chest tightness, and wheezing. +SOB  Cardiovascular: Denies palpitations and leg swelling. +chest pain  Gastrointestinal: Denies nausea, vomiting, diarrhea, constipation, blood in stool and abdominal distention. +abdominal pain  Genitourinary: Denies dysuria, urgency, frequency, hematuria, flank pain and difficulty urinating.  Musculoskeletal: Denies myalgias, back pain, joint swelling, arthralgias and gait problem.  Skin: Denies pallor, rash and wound.  Neurological: Denies dizziness, seizures, syncope, weakness, light-headedness,  numbness and headaches.  Hematological: Denies adenopathy. Easy bruising, personal or family bleeding history  Psychiatric/Behavioral: Denies suicidal ideation, mood changes, confusion, nervousness, sleep disturbance and agitation   Physical Exam:  Blood pressure 126/72, pulse 87, temperature 98 F (36.7 C), temperature source Oral, resp. rate 18, SpO2 97.00%.  Vitals reviewed.  General: Sitting in bed, in NAD  HEENT: PERRL, EOMI, no scleral icterus, wearing glasses  Neck: Supple with no TTP, full ROM  Chest: Well healed CABG scar, right HD cath in place with no surrounding edema or erythema, no TTP over right chest.  Cardiac: RRR, no rubs, 2/6 murmur heard best at the  Pulm: Fine crackles up to her mid lung fields bilaterally, no wheezes, rales, or rhonchi  Abd: soft, nondistended, hypoactive bowel sounds, mid epigastric tenderness with deep palpation  Ext: warm and well perfused, 1+ pitting edema bilaterally up to her knees. Left arm covered with ACE bandage from deltoid to tip of fingers. Right arm with full ROM and no tenderness to palpation.  Neuro: alert and oriented X3, cranial nerves II-XII grossly intact, strength and sensation to light touch equal in bilateral upper and lower extremities  Psych: Mood is "good", with congruent affect, she jokes at times, calling her pseudocyst "[facility] Course by  problem list: 1. *Chest pain secondary to angina 2. Leukocytosis  3. Pancreatitis, chronic 4. ESRD on hemodialysis  61 year old woman recently discharged for chest pain and abdominal pain. Presents again for chest pain which is resolved after two Nitroglycerin.   1. Chest pain likely secondary to angina, resolved  With history of diffuse CAD and ischemic cardiomyopathy, EF 10% echo 05/08/12 status post ICD. We wanted to rule out ACS. Her timi score is 3 which makes her risk 13% within 14 days of ACS event. Chest pain was relieved after the second tablet of NTG this was most likely related to stable angina and she had not picked up her NTG from the pharmacy yet due to cost. CXR normal. CE negative x 3. EKG no acute changes.  We emphasized the need for her to pick up her medications from the pharmacy.  She should continue nitroglycerin, Ranexa, Coreg, Imdur, Aspirin.  [facility] and is ready for pick up since last discharge.  Her son is supposed to [employer] her some money today to help with medications.    2. Leukocytosis  This may be chronic secondary to pancreatic necrosis and pancreatic cyst which may have been infected last admission.  She is still getting antibiotics Fortaz and Vancomycin with HD.  She should get this until 06/07/12.  This was mentioned in the previous discharge summary and faxed to her dialysis center.  She was continued on Vancomycin and Fortaz this admission.  She also have liver disease making her more prone to spontaneous bacterial peritonitis.  She is supposed to start taking Flagyl 12/25 for SBP prophylaxis due to liver disease.    3. Pancreatitis, chronic  Lipase 12. Her pain is controlled with current pain regimen from palliative care consult at last admission.    4. ESRD on hemodialysis TThSat  She had HD yesterday.  Her next session is tomorrow.  She was recently discharged from [facility] for revision of left upper  extremity graft causing her left arm to be edematous. The edema has trended down since the revision with Dr. Trecia Rogers.     Discharge Vitals:  BP 118/68  Pulse 82  Temp 98.1 F (36.7 C) (Oral)  Resp 18  Ht 5\' 4"  (1.626 m)  Wt 124 lb 1.9 oz (56.3 kg)  BMI 21.30 kg/m2  SpO2 97%  Discharge physical exam:  General:lying in bed, talkative, nad, alert and oriented x 3  HEENT: [place]/at  CV: RRR, no murmurs  Lungs: ctab  Abdomen: soft, nd, mild ttp RUQ, epigstric, LUQ, RLQ  Extremities: decreased swelling left upper extremity, 1+ edema b/l lower extremities  Neuro: CN 2-12 grossly intact, moving all 4 extremities  Discharge Labs:  Results for CHANLER, SCHREITER (MRN [id number]) as of 06/03/2012 09:20  Ref. Range 06/02/2012 19:38 06/03/2012 00:08 06/03/2012 05:55  Sodium Latest Range: 135-145 mEq/L 134 (L)  137  Potassium Latest Range: 3.5-5.1 mEq/L 3.7  4.3  Chloride Latest Range: 96-112 mEq/L 97  101  CO2 Latest Range: 19-32 mEq/L 26  26  BUN Latest Range: 6-23 mg/dL 19  23  Creatinine Latest Range: 0.50-1.10 mg/dL 8.29 (H)  5.62 (H)  Calcium Latest Range: 8.4-10.5 mg/dL 13.0  86.5  GFR calc non Af Amer Latest Range: >90 mL/min 16 (L)  13 (L)  GFR calc Af Amer Latest Range: >90 mL/min 18 (L)  15 (L)  Glucose Latest Range: 70-99 mg/dL 784 (H)  696 (H)  Phosphorus Latest Range: 2.3-4.6 mg/dL   3.7  Magnesium Latest Range: 1.5-2.5 mg/dL  2.1   Alkaline Phosphatase Latest Range: 39-117 U/L 123 (H)    Albumin Latest Range: 3.5-5.2 g/dL 2.0 (L)  1.9 (L)  Lipase Latest Range: 11-59 U/L 12    AST Latest Range: 0-37 U/L 15    ALT Latest Range: 0-35 U/L <5    Total Protein Latest Range: 6.0-8.3 g/dL 6.9    Total Bilirubin Latest Range: 0.3-1.2 mg/dL 0.3     Results for EVERLENE, CUNNING (MRN [id number]) as of 06/03/2012 09:20  Ref. Range 06/02/2012 20:02 06/03/2012 00:08 06/03/2012 05:55  Troponin I Latest Range: <0.30 ng/mL  <0.30 <0.30  Troponin i, poc Latest Range: 0.00-0.08 ng/mL 0.03      Results for DONIKA, BUTNER (MRN [id number]) as of 06/03/2012 09:20  Ref. Range 06/02/2012 19:38  WBC Latest Range: 4.0-10.5 K/uL 13.3 (H)  RBC Latest Range: 3.87-5.11 MIL/uL 3.55 (L)  Hemoglobin Latest Range: 12.0-15.0 g/dL 9.8 (L)  HCT Latest Range: 36.0-46.0 % 31.0 (L)  MCV Latest Range: 78.0-100.0 fL 87.3  MCH Latest Range: 26.0-34.0 pg 27.6  MCHC Latest Range: 30.0-36.0 g/dL 36.6  RDW Latest Range: 11.5-15.5 % 17.6 (H)  Platelets Latest Range: 150-400 K/uL 204     Results for orders placed during the hospital encounter of 06/02/12 (from the past 24 hour(s))  CBC     Status: Abnormal   Collection Time   06/02/12  7:38 PM      Component Value Range   WBC 13.3 (*) 4.0 - 10.5 K/uL   RBC 3.55 (*) 3.87 - 5.11 MIL/uL   Hemoglobin 9.8 (*) 12.0 - 15.0 g/dL   HCT 44.0 (*) 34.7 - 42.5 %   MCV 87.3  78.0 - 100.0 fL   MCH 27.6  26.0 - 34.0 pg   MCHC 31.6  30.0 - 36.0 g/dL   RDW 95.6 (*) 38.7 - 56.4 %   Platelets 204  150 - 400 K/uL  COMPREHENSIVE METABOLIC PANEL     Status: Abnormal   Collection Time   06/02/12  7:38 PM      Component Value Range   Sodium 134 (*) 135 - 145 mEq/L  Potassium 3.7  3.5 - 5.1 mEq/L   Chloride 97  96 - 112 mEq/L   CO2 26  19 - 32 mEq/L   Glucose, Bld 118 (*) 70 - 99 mg/dL   BUN 19  6 - 23 mg/dL   Creatinine, Ser 1.61 (*) 0.50 - 1.10 mg/dL   Calcium 09.6  8.4 - 04.5 mg/dL   Total Protein 6.9  6.0 - 8.3 g/dL   Albumin 2.0 (*) 3.5 - 5.2 g/dL   AST 15  0 - 37 U/L   ALT <5  0 - 35 U/L   Alkaline Phosphatase 123 (*) 39 - 117 U/L   Total Bilirubin 0.3  0.3 - 1.2 mg/dL   GFR calc non Af Amer 16 (*) >90 mL/min   GFR calc Af Amer 18 (*) >90 mL/min  LIPASE, BLOOD     Status: Normal   Collection Time   06/02/12  7:38 PM      Component Value Range   Lipase 12  11 - 59 U/L  PRO B NATRIURETIC PEPTIDE     Status: Abnormal   Collection Time   06/02/12  7:44 PM      Component Value Range   Pro B Natriuretic peptide (BNP) >70000.0 (*) 0 - 125 pg/mL   POCT I-STAT TROPONIN I     Status: Normal   Collection Time   06/02/12  8:02 PM      Component Value Range   Troponin i, poc 0.03  0.00 - 0.08 ng/mL   Comment 3           MAGNESIUM     Status: Normal   Collection Time   06/03/12 12:08 AM      Component Value Range   Magnesium 2.1  1.5 - 2.5 mg/dL  TROPONIN I     Status: Normal   Collection Time   06/03/12 12:08 AM      Component Value Range   Troponin I <0.30  <0.30 ng/mL  RENAL FUNCTION PANEL     Status: Abnormal   Collection Time   06/03/12  5:55 AM      Component Value Range   Sodium 137  135 - 145 mEq/L   Potassium 4.3  3.5 - 5.1 mEq/L   Chloride 101  96 - 112 mEq/L   CO2 26  19 - 32 mEq/L   Glucose, Bld 119 (*) 70 - 99 mg/dL   BUN 23  6 - 23 mg/dL   Creatinine, Ser 4.09 (*) 0.50 - 1.10 mg/dL   Calcium 81.1  8.4 - 91.4 mg/dL   Phosphorus 3.7  2.3 - 4.6 mg/dL   Albumin 1.9 (*) 3.5 - 5.2 g/dL   GFR calc non Af Amer 13 (*) >90 mL/min   GFR calc Af Amer 15 (*) >90 mL/min  TROPONIN I     Status: Normal   Collection Time   06/03/12  5:55 AM      Component Value Range   Troponin I <0.30  <0.30 ng/mL  CBC WITH DIFFERENTIAL     Status: Abnormal   Collection Time   06/03/12  9:32 AM      Component Value Range   WBC 11.6 (*) 4.0 - 10.5 K/uL   RBC 3.37 (*) 3.87 - 5.11 MIL/uL   Hemoglobin 9.4 (*) 12.0 - 15.0 g/dL   HCT 78.2 (*) 95.6 - 21.3 %   MCV 89.3  78.0 - 100.0 fL   MCH 27.9  26.0 - 34.0 pg  MCHC 31.2  30.0 - 36.0 g/dL   RDW 09.8 (*) 11.9 - 14.7 %   Platelets 180  150 - 400 K/uL   Neutrophils Relative 85 (*) 43 - 77 %   Neutro Abs 9.8 (*) 1.7 - 7.7 K/uL   Lymphocytes Relative 8 (*) 12 - 46 %   Lymphs Abs 0.9  0.7 - 4.0 K/uL   Monocytes Relative 6  3 - 12 %   Monocytes Absolute 0.7  0.1 - 1.0 K/uL   Eosinophils Relative 2  0 - 5 %   Eosinophils Absolute 0.2  0.0 - 0.7 K/uL   Basophils Relative 0  0 - 1 %   Basophils Absolute 0.0  0.0 - 0.1 K/uL    Signed: Annett Gula 06/03/2012, 11:12 AM   Time  Spent on Discharge: 45 minutes  Services Ordered on Discharge: none Equipment Ordered on Discharge: none

## 2012-06-03 NOTE — H&P (Signed)
Internal Medicine Teaching Service Attending Note Date: 06/03/2012  Patient name: [name]  Medical record number: [id number]  Date of birth: 1950/10/17    CHIEF COMPLAINT Chest pain    HISTORY OF PRESENT ILLNESS The patient, [name], is a 61 y.o. year old female, with past medical history as mentioned below, whom we just discharged on 05/26/12, who comes in again with the chief  complaint of chest pain primarily because she ran out of her nitroglycerin at the time she had the pain and got scared. I have read the history documented by Dr.Kennerly and I concur with the chronology of events.  When I met with the patient today, she was her pleasant talkative self, and did not have any chest pain. She said that it resolved as soon as she got nitroglycerin in the ED. She has no other new complaints and she wants to go home. Her son has wired her some money and now she has nitroglycerin waiting for her at her pharmacy. She received her HD yesterday and is not overloaded at this time. She has baseline SOB.     PAST MEDICAL HISTORY  has a past medical history of Gallstone pancreatitis; Coronary artery disease; Ischemic cardiomyopathy; History of nonadherence to medical treatment; Hyperlipidemia; Systolic CHF; Esophageal varices; Bacteremia; Fatty liver; GIB (gastrointestinal bleeding); Fungemia; Perforated bowel; Pacemaker; ICD (implantable cardiac defibrillator) in place; Hypertension; CHF (congestive heart failure); Heart murmur; NSTEMI (non-ST elevated myocardial infarction); Pneumonia (02/2012); Chronic bronchitis (1970's thru 2002); Diabetes mellitus; History of blood transfusion; End stage renal disease; Iron (Fe) deficiency anemia; and Fatty liver.    FAMILY AND SOCIAL HISTORY  reports that she quit smoking about 11 years ago. Her smoking use included Cigarettes. She has a 10.23 pack-year smoking history. She has never used smokeless tobacco. She reports that she drinks alcohol. She  reports that she does not use illicit drugs. family history includes Coronary artery disease in her mother; Diabetes in her mother and sister; and Hypertension in her mother.  There is no history of Anesthesia problems.    REVIEW OF SYSTEMS Negative as of today. Chest pain resolved.  Constitutional:  Denies fever, chills, diaphoresis, appetite change and fatigue.  HEENT: Denies photophobia, eye pain, redness, hearing loss, ear pain, congestion, sore throat, rhinorrhea, sneezing, neck pain, neck stiffness and tinnitus.  Respiratory: SOB at baseline. Denies DOE, cough, chest tightness, and wheezing.  Cardiovascular: Denies chest pain, palpitations and leg swelling.  Gastrointestinal: Baseline abdominal distension/ascitis. Denies nausea, vomiting, abdominal pain, diarrhea, constipation, blood in stool.  Genitourinary: Denies dysuria, urgency, frequency, hematuria, flank pain and difficulty urinating.  Musculoskeletal: Denies myalgias, back pain, joint swelling, arthralgias and gait problem.   Skin: Denies pallor, rash and wound.  Neurological: Denies dizziness, seizures, syncope, weakness, light-headedness, numbness and headaches.   Hematological: Denies adenopathy, easy bruising, personal or family bleeding history.  Psychiatric/ Behavioral: Denies suicidal ideation, mood changes, confusion, nervousness, sleep disturbance and agitation.      SERIAL VITALS Filed Vitals:   06/02/12 2145 06/02/12 2312 06/03/12 0014 06/03/12 0500  BP: 119/78 119/72 126/72 118/68  Pulse: 88  87 82  Temp:   98 F (36.7 C) 98.1 F (36.7 C)  TempSrc:    Oral  Resp: 21 19 18    Height:   5\' 4"  (1.626 m)   Weight:   124 lb 1.9 oz (56.3 kg)   SpO2: 97% 99% 97% 97%      PHYSICAL EXAM  I met with patient around 10 am  today  General: Resting in bed. HEENT: PERRL, EOMI, no scleral icterus. Heart: RRR, no rubs, murmurs or gallops. Chest: Right side catheter.  Lungs: Clear to auscultation bilaterally, no  wheezes, rales, or rhonchi. Abdomen: Soft, nontender, mildly distended, BS present. Extremities: Warm,mild pedal edema. Left arm bandaged, edema decreased from prior admission. Neuro: Alert and oriented X3, cranial nerves II-XII grossly intact,  strength and sensation to light touch equal in bilateral upper and lower extremities    LAB RESULTS  CMP     Component Value Date/Time   NA 137 06/03/2012 0555   K 4.3 06/03/2012 0555   CL 101 06/03/2012 0555   CO2 26 06/03/2012 0555   GLUCOSE 119* 06/03/2012 0555   BUN 23 06/03/2012 0555   CREATININE 3.46* 06/03/2012 0555   CALCIUM 10.3 06/03/2012 0555   PROT 6.9 06/02/2012 1938   ALBUMIN 1.9* 06/03/2012 0555   AST 15 06/02/2012 1938   ALT <5 06/02/2012 1938   ALKPHOS 123* 06/02/2012 1938   BILITOT 0.3 06/02/2012 1938   GFRNONAA 13* 06/03/2012 0555   GFRAA 15* 06/03/2012 0555   CBC    Component Value Date/Time   WBC 11.6* 06/03/2012 0932   WBC 6.7 04/08/2011 1054   RBC 3.37* 06/03/2012 0932   RBC 3.40* 04/08/2011 1054   HGB 9.4* 06/03/2012 0932   HGB 10.1* 04/08/2011 1054   HCT 30.1* 06/03/2012 0932   HCT 30.1* 04/08/2011 1054   PLT 180 06/03/2012 0932   PLT 232 04/08/2011 1054   MCV 89.3 06/03/2012 0932   MCV 88.6 04/08/2011 1054   MCH 27.9 06/03/2012 0932   MCH 29.7 04/08/2011 1054   MCHC 31.2 06/03/2012 0932   MCHC 33.5 04/08/2011 1054   RDW 17.8* 06/03/2012 0932   RDW 20.4* 04/08/2011 1054   LYMPHSABS 0.9 06/03/2012 0932   LYMPHSABS 1.4 04/08/2011 1054   MONOABS 0.7 06/03/2012 0932   MONOABS 0.4 04/08/2011 1054   EOSABS 0.2 06/03/2012 0932   EOSABS 0.2 04/08/2011 1054   BASOSABS 0.0 06/03/2012 0932   BASOSABS 0.0 04/08/2011 1054     Lab 06/03/12 0555 06/03/12 0008  TROPONINI <0.30 <0.30    Lab 06/02/12 1944  PROBNP >70000.0*    IMAGING RESULTS Dg Chest Port 1 View  06/02/2012  *RADIOLOGY REPORT*  Clinical Data: Chest pain, shortness of breath  PORTABLE CHEST - 1 VIEW  Comparison: 05/22/2012  Findings:  Low lung volumes with vascular crowding.  No frank interstitial edema. No pleural effusion or pneumothorax.  Cardiomegaly.  Postsurgical changes related to prior CABG.  Left subclavian ICD.  Stable right IJ venous catheter.  IMPRESSION: No evidence of acute cardiopulmonary disease.   Original Report Authenticated By: Charline Bills, M.D.      EKG Normal Sinus Rhythm with LBBB. Similar to prior. It does not show any changes significant for Acute MI at this time.       ASSESSMENT AND PLAN This is a 61 y.o. year old female with ESRD, CAD with ICM with pacemaker, coming in with chest pain which was prolonged because she did not have her nitroglycerin pills. The moment she got nitro in ER it resolved and she hasn't had it since. She has been ruled out. She is continuing with her HD and is compliant. She is very well aware and educated about her problems. I think she can be discharged once we are sure that she has follow up (she had a PCP appointment today which she couldn't make because she is in the hospital) and  adequate required medication.   Other chronic issues per resident note.       Thanks, Aletta Edouard, MD 12/20/201311:12 AM

## 2012-06-03 NOTE — Progress Notes (Signed)
Subjective: Patient states she had right chest pain yesterday which resolved after  2 nitroglycerin.  She still has chronic ab pain which is better after she was given NTG x 2 yesterday.  She states yesterday she thought she was going to die.  She felt sob which is resolved and she states he HR was 177.  Her ICD did not fire.  She recently had revision of AVG with decreased swelling in her left arm.    Objective: Vital signs in last 24 hours: Filed Vitals:   06/02/12 2145 06/02/12 2312 06/03/12 0014 06/03/12 0500  BP: 119/78 119/72 126/72 118/68  Pulse: 88  87 82  Temp:   98 F (36.7 C) 98.1 F (36.7 C)  TempSrc:    Oral  Resp: 21 19 18    Height:   5\' 4"  (1.626 m)   Weight:   124 lb 1.9 oz (56.3 kg)   SpO2: 97% 99% 97% 97%   General:lying in bed, talkative, nad, alert and oriented x 3 HEENT: [place]/at CV: RRR, no murmurs  Lungs: ctab Abdomen: soft, nd, mild ttp RUQ, epigstric, LUQ, RLQ Extremities: decreased swelling left upper extremity, 1+ edema b/l lower extremities  Neuro: CN 2-12 grossly intact, moving all 4 extremities  Lab Results: Basic Metabolic Panel:  Lab 06/03/12 4098 06/03/12 0008 06/02/12 1938  NA 137 -- 134*  K 4.3 -- 3.7  CL 101 -- 97  CO2 26 -- 26  GLUCOSE 119* -- 118*  BUN 23 -- 19  CREATININE 3.46* -- 2.96*  CALCIUM 10.3 -- 10.0  MG -- 2.1 --  PHOS 3.7 -- --   Liver Function Tests:  Lab 06/03/12 0555 06/02/12 1938  AST -- 15  ALT -- <5  ALKPHOS -- 123*  BILITOT -- 0.3  PROT -- 6.9  ALBUMIN 1.9* 2.0*    Lab 06/02/12 1938  LIPASE 12  AMYLASE --   CBC:  Lab 06/02/12 1938  WBC 13.3*  NEUTROABS --  HGB 9.8*  HCT 31.0*  MCV 87.3  PLT 204   Cardiac Enzymes:  Lab 06/03/12 0555 06/03/12 0008  CKTOTAL -- --  CKMB -- --  CKMBINDEX -- --  TROPONINI <0.30 <0.30   BNP:  Lab 06/02/12 1944  PROBNP >70000.0*   CBG:  Lab 05/27/12 1635 05/27/12 1122  GLUCAP 190* 184*   Misc. Labs: none  Micro Results: Recent Results (from the past 240  hour(s))  BODY FLUID CULTURE     Status: Normal   Collection Time   05/25/12  1:02 PM      Component Value Range Status Comment   Specimen Description FLUID ASCITIC   Final    Special Requests SYRINGE @ 60CC   Final    Gram Stain     Final    Value: RARE WBC PRESENT, PREDOMINANTLY MONONUCLEAR     NO ORGANISMS SEEN   Culture NO GROWTH 3 DAYS   Final    Report Status 05/29/2012 FINAL   Final   FUNGUS CULTURE W SMEAR     Status: Normal (Preliminary result)   Collection Time   05/25/12  1:02 PM      Component Value Range Status Comment   Specimen Description FLUID ASCITIC   Final    Special Requests SYRINGE @ 60CC   Final    Fungal Smear NO YEAST OR FUNGAL ELEMENTS SEEN   Final    Culture CULTURE IN PROGRESS FOR FOUR WEEKS   Final    Report Status PENDING   Incomplete  Studies/Results: Dg Chest Port 1 View  06/02/2012  *RADIOLOGY REPORT*  Clinical Data: Chest pain, shortness of breath  PORTABLE CHEST - 1 VIEW  Comparison: 05/22/2012  Findings: Low lung volumes with vascular crowding.  No frank interstitial edema. No pleural effusion or pneumothorax.  Cardiomegaly.  Postsurgical changes related to prior CABG.  Left subclavian ICD.  Stable right IJ venous catheter.  IMPRESSION: No evidence of acute cardiopulmonary disease.   Original Report Authenticated By: Charline Bills, M.D.    Medications:  Scheduled Meds:    . aspirin EC  81 mg Oral Daily  . carvedilol  6.25 mg Oral BID WC  . cefTAZidime (FORTAZ)  IV  2 g Intravenous Q T,Th,Sa-HD  . darbepoetin  100 mcg Intravenous Q Sat-HD  . feeding supplement (NEPRO CARB STEADY)  237 mL Oral BID WC  . heparin  5,000 Units Subcutaneous Q8H  . hydrALAZINE  25 mg Oral Q8H  . HYDROmorphone  4 mg Oral Q4H  . isosorbide mononitrate  30 mg Oral Daily  . magnesium oxide  400 mg Oral Daily  . methadone  5 mg Oral TID  . multivitamin  1 tablet Oral Daily  . pantoprazole  40 mg Oral Daily  . ranolazine  500 mg Oral Daily  . sevelamer  800 mg  Oral TID WC  . simvastatin  10 mg Oral q1800  . sodium chloride  3 mL Intravenous Q12H  . sodium chloride  3 mL Intravenous Q12H  . vancomycin (VANCOCIN) 500 mg IVPB (Hemodialysis)  500 mg Intravenous Q T,Th,Sa-HD   Continuous Infusions:  PRN Meds:.sodium chloride, nitroGLYCERIN, promethazine, promethazine, promethazine, sodium chloride  Assessment/Plan: 61 year old woman recently discharged for chest pain and abdominal pain. Presents again for chest pain which is resolved.   1. Chest pain likely secondary to angina, resolved  With history of diffuse CAD and ischemic cardiomyopathy, EF 10% echo 05/08/12 s/p ICD.  We wanted to rule out ACS.  Her timi score is 3 which makes her risk 13% w/in 14 days of ACS event.  Chest pain was relieved after the second tablet of NTG this was most likely related to stable angina and she had not picked up her NTG from the pharmacy yet due to cost.  CXR normal. CE negative x 3.  EKG no acute changes  -d/c home today -continue home medications NTG, Ranexa, Coreg, Imdur, Aspirin -Her Rx for NTG is already at the pharmacy [facility] and she was not able to afford the medication so did not have prior to admission  2. Leukocytosis -pending cbc -she had a pancreatic cyst which may have been infected last admission -She is supposed to start taking Flagyl 12/25 for SBP prophylaxis due to liver disease -She is supposed to be getting IV abx with HD until 06/07/12 -On Fortaz, Vancomycin  3. Pancreatitis, chronic  -lipase 12.  Her pain is controlled with current pain regimen  4. ESRD on hemodialysis TThSat -She had HD yesterday -She will have HD tomorrow   Dispo: d/c today with f/u with PCP  The patient does have a current PCP (ARMOUR, ROSS B, MD), therefore will be requiring OPC follow-up after discharge.   The patient does not have transportation limitations that hinder transportation to clinic appointments.  .Services Needed at time of discharge: Y = Yes, Blank =  No PT:   OT:   RN:   Equipment:   Other:     LOS: 1 day   Annett Gula [phone] 06/03/2012, 8:56  AM

## 2012-06-03 NOTE — Discharge Summary (Signed)
Internal Medicine Teaching Service Attending Note Date: 06/03/2012  Patient name: [name]  Medical record number: [id number]  Date of birth: Dec 21, 1950    I evaluated the patient on the day of discharge and discussed the discharge plan with my resident team. Please see my H & P from today. I agree with the discharge documentation and disposition.  Thank you for working with me on this patient's care.   Thanks Aletta Edouard 06/03/2012, 11:31 AM

## 2012-06-06 ENCOUNTER — Ambulatory Visit: Payer: Non-veteran care | Admitting: Surgery

## 2012-06-20 ENCOUNTER — Inpatient Hospital Stay: Payer: Non-veteran care | Admitting: Infectious Disease

## 2012-06-22 LAB — FUNGUS CULTURE W SMEAR: Fungal Smear: NONE SEEN

## 2012-06-25 ENCOUNTER — Inpatient Hospital Stay (HOSPITAL_COMMUNITY)
Admission: EM | Admit: 2012-06-25 | Discharge: 2012-07-07 | DRG: 438 | Disposition: A | Discharge status: Home / Self Care | Payer: Non-veteran care | Attending: Internal Medicine | Admitting: Internal Medicine

## 2012-06-25 ENCOUNTER — Emergency Department (HOSPITAL_COMMUNITY): Payer: Non-veteran care

## 2012-06-25 ENCOUNTER — Encounter (HOSPITAL_COMMUNITY): Payer: Self-pay

## 2012-06-25 DIAGNOSIS — K7689 Other specified diseases of liver: Secondary | ICD-10-CM | POA: Diagnosis present

## 2012-06-25 DIAGNOSIS — K8689 Other specified diseases of pancreas: Secondary | ICD-10-CM | POA: Diagnosis present

## 2012-06-25 DIAGNOSIS — I2510 Atherosclerotic heart disease of native coronary artery without angina pectoris: Secondary | ICD-10-CM | POA: Diagnosis present

## 2012-06-25 DIAGNOSIS — I495 Sick sinus syndrome: Secondary | ICD-10-CM

## 2012-06-25 DIAGNOSIS — I252 Old myocardial infarction: Secondary | ICD-10-CM

## 2012-06-25 DIAGNOSIS — I5022 Chronic systolic (congestive) heart failure: Secondary | ICD-10-CM | POA: Diagnosis present

## 2012-06-25 DIAGNOSIS — R188 Other ascites: Secondary | ICD-10-CM

## 2012-06-25 DIAGNOSIS — K631 Perforation of intestine (nontraumatic): Secondary | ICD-10-CM

## 2012-06-25 DIAGNOSIS — D696 Thrombocytopenia, unspecified: Secondary | ICD-10-CM

## 2012-06-25 DIAGNOSIS — A498 Other bacterial infections of unspecified site: Secondary | ICD-10-CM | POA: Diagnosis present

## 2012-06-25 DIAGNOSIS — I214 Non-ST elevation (NSTEMI) myocardial infarction: Secondary | ICD-10-CM

## 2012-06-25 DIAGNOSIS — B49 Unspecified mycosis: Secondary | ICD-10-CM

## 2012-06-25 DIAGNOSIS — J9601 Acute respiratory failure with hypoxia: Secondary | ICD-10-CM

## 2012-06-25 DIAGNOSIS — Z9861 Coronary angioplasty status: Secondary | ICD-10-CM

## 2012-06-25 DIAGNOSIS — E785 Hyperlipidemia, unspecified: Secondary | ICD-10-CM | POA: Diagnosis present

## 2012-06-25 DIAGNOSIS — R5381 Other malaise: Secondary | ICD-10-CM | POA: Diagnosis present

## 2012-06-25 DIAGNOSIS — M7989 Other specified soft tissue disorders: Secondary | ICD-10-CM

## 2012-06-25 DIAGNOSIS — I509 Heart failure, unspecified: Secondary | ICD-10-CM | POA: Diagnosis present

## 2012-06-25 DIAGNOSIS — N19 Unspecified kidney failure: Secondary | ICD-10-CM

## 2012-06-25 DIAGNOSIS — R109 Unspecified abdominal pain: Secondary | ICD-10-CM

## 2012-06-25 DIAGNOSIS — I8510 Secondary esophageal varices without bleeding: Secondary | ICD-10-CM | POA: Diagnosis present

## 2012-06-25 DIAGNOSIS — D729 Disorder of white blood cells, unspecified: Secondary | ICD-10-CM

## 2012-06-25 DIAGNOSIS — K922 Gastrointestinal hemorrhage, unspecified: Secondary | ICD-10-CM

## 2012-06-25 DIAGNOSIS — K7460 Unspecified cirrhosis of liver: Secondary | ICD-10-CM | POA: Diagnosis present

## 2012-06-25 DIAGNOSIS — R1032 Left lower quadrant pain: Secondary | ICD-10-CM

## 2012-06-25 DIAGNOSIS — Z66 Do not resuscitate: Secondary | ICD-10-CM | POA: Diagnosis not present

## 2012-06-25 DIAGNOSIS — K861 Other chronic pancreatitis: Secondary | ICD-10-CM | POA: Diagnosis present

## 2012-06-25 DIAGNOSIS — L8990 Pressure ulcer of unspecified site, unspecified stage: Secondary | ICD-10-CM

## 2012-06-25 DIAGNOSIS — R079 Chest pain, unspecified: Secondary | ICD-10-CM

## 2012-06-25 DIAGNOSIS — K862 Cyst of pancreas: Principal | ICD-10-CM | POA: Diagnosis present

## 2012-06-25 DIAGNOSIS — R509 Fever, unspecified: Secondary | ICD-10-CM

## 2012-06-25 DIAGNOSIS — I120 Hypertensive chronic kidney disease with stage 5 chronic kidney disease or end stage renal disease: Secondary | ICD-10-CM | POA: Diagnosis present

## 2012-06-25 DIAGNOSIS — Z95810 Presence of automatic (implantable) cardiac defibrillator: Secondary | ICD-10-CM

## 2012-06-25 DIAGNOSIS — Z992 Dependence on renal dialysis: Secondary | ICD-10-CM

## 2012-06-25 DIAGNOSIS — D509 Iron deficiency anemia, unspecified: Secondary | ICD-10-CM | POA: Diagnosis present

## 2012-06-25 DIAGNOSIS — N186 End stage renal disease: Secondary | ICD-10-CM | POA: Diagnosis present

## 2012-06-25 DIAGNOSIS — E875 Hyperkalemia: Secondary | ICD-10-CM | POA: Diagnosis not present

## 2012-06-25 DIAGNOSIS — R630 Anorexia: Secondary | ICD-10-CM

## 2012-06-25 DIAGNOSIS — I472 Ventricular tachycardia: Secondary | ICD-10-CM

## 2012-06-25 DIAGNOSIS — I81 Portal vein thrombosis: Secondary | ICD-10-CM | POA: Diagnosis present

## 2012-06-25 DIAGNOSIS — I2589 Other forms of chronic ischemic heart disease: Secondary | ICD-10-CM | POA: Diagnosis present

## 2012-06-25 DIAGNOSIS — I255 Ischemic cardiomyopathy: Secondary | ICD-10-CM | POA: Diagnosis present

## 2012-06-25 DIAGNOSIS — R5383 Other fatigue: Secondary | ICD-10-CM | POA: Diagnosis present

## 2012-06-25 DIAGNOSIS — A4159 Other Gram-negative sepsis: Secondary | ICD-10-CM

## 2012-06-25 DIAGNOSIS — Z951 Presence of aortocoronary bypass graft: Secondary | ICD-10-CM

## 2012-06-25 DIAGNOSIS — J189 Pneumonia, unspecified organism: Secondary | ICD-10-CM

## 2012-06-25 DIAGNOSIS — D631 Anemia in chronic kidney disease: Secondary | ICD-10-CM | POA: Diagnosis present

## 2012-06-25 DIAGNOSIS — N2581 Secondary hyperparathyroidism of renal origin: Secondary | ICD-10-CM | POA: Diagnosis present

## 2012-06-25 DIAGNOSIS — R7881 Bacteremia: Secondary | ICD-10-CM

## 2012-06-25 DIAGNOSIS — B3789 Other sites of candidiasis: Secondary | ICD-10-CM | POA: Diagnosis present

## 2012-06-25 DIAGNOSIS — K863 Pseudocyst of pancreas: Secondary | ICD-10-CM

## 2012-06-25 DIAGNOSIS — E119 Type 2 diabetes mellitus without complications: Secondary | ICD-10-CM | POA: Diagnosis present

## 2012-06-25 DIAGNOSIS — I447 Left bundle-branch block, unspecified: Secondary | ICD-10-CM

## 2012-06-25 DIAGNOSIS — I10 Essential (primary) hypertension: Secondary | ICD-10-CM | POA: Diagnosis present

## 2012-06-25 HISTORY — DX: Unspecified cirrhosis of liver: K74.60

## 2012-06-25 LAB — COMPREHENSIVE METABOLIC PANEL
ALT: 5 U/L (ref 0–35)
AST: 14 U/L (ref 0–37)
Albumin: 2 g/dL — ABNORMAL LOW (ref 3.5–5.2)
Alkaline Phosphatase: 118 U/L — ABNORMAL HIGH (ref 39–117)
Calcium: 11.2 mg/dL — ABNORMAL HIGH (ref 8.4–10.5)
GFR calc Af Amer: 8 mL/min — ABNORMAL LOW (ref >90)
Glucose, Bld: 64 mg/dL — ABNORMAL LOW (ref 70–99)
Potassium: 5.4 mEq/L — ABNORMAL HIGH (ref 3.5–5.1)
Sodium: 130 mEq/L — ABNORMAL LOW (ref 135–145)
Total Protein: 7.3 g/dL (ref 6.0–8.3)

## 2012-06-25 LAB — GLUCOSE, CAPILLARY: Glucose-Capillary: 81 mg/dL (ref 70–99)

## 2012-06-25 LAB — LIPASE, BLOOD: Lipase: 8 U/L — ABNORMAL LOW (ref 11–59)

## 2012-06-25 LAB — CBC WITH DIFFERENTIAL/PLATELET
Basophils Absolute: 0 10*3/uL (ref 0.0–0.1)
Basophils Relative: 0 % (ref 0–1)
Eosinophils Relative: 1 % (ref 0–5)
HCT: 30.7 % — ABNORMAL LOW (ref 36.0–46.0)
Hemoglobin: 9.5 g/dL — ABNORMAL LOW (ref 12.0–15.0)
MCH: 27.5 pg (ref 26.0–34.0)
MCHC: 30.9 g/dL (ref 30.0–36.0)
MCV: 89 fL (ref 78.0–100.0)
Monocytes Absolute: 0.8 10*3/uL (ref 0.1–1.0)
Monocytes Relative: 8 % (ref 3–12)
Neutro Abs: 7.2 10*3/uL (ref 1.7–7.7)
RDW: 17.9 % — ABNORMAL HIGH (ref 11.5–15.5)

## 2012-06-25 LAB — CBC
HCT: 33.2 % — ABNORMAL LOW (ref 36.0–46.0)
Hemoglobin: 10.2 g/dL — ABNORMAL LOW (ref 12.0–15.0)
WBC: 10.3 10*3/uL (ref 4.0–10.5)

## 2012-06-25 MED ORDER — HYDROMORPHONE HCL PF 1 MG/ML IJ SOLN
1.0000 mg | Freq: Once | INTRAMUSCULAR | Status: AC
  Administered 2012-06-25: 1 mg via INTRAVENOUS
  Filled 2012-06-25: qty 1

## 2012-06-25 MED ORDER — RANOLAZINE ER 500 MG PO TB12
500.0000 mg | ORAL_TABLET | Freq: Every day | ORAL | Status: DC
  Administered 2012-06-26 – 2012-07-07 (×12): 500 mg via ORAL
  Filled 2012-06-25 (×12): qty 1

## 2012-06-25 MED ORDER — SODIUM CHLORIDE 0.9 % IJ SOLN
3.0000 mL | Freq: Two times a day (BID) | INTRAMUSCULAR | Status: DC
  Administered 2012-06-26 – 2012-07-06 (×12): 3 mL via INTRAVENOUS
  Administered 2012-07-06: 21:00:00 via INTRAVENOUS

## 2012-06-25 MED ORDER — HYDROMORPHONE HCL 2 MG PO TABS
4.0000 mg | ORAL_TABLET | ORAL | Status: DC
  Administered 2012-06-25 – 2012-07-02 (×37): 4 mg via ORAL
  Administered 2012-07-02: 12:00:00 via ORAL
  Administered 2012-07-02 – 2012-07-07 (×28): 4 mg via ORAL
  Filled 2012-06-25 (×2): qty 2
  Filled 2012-06-25: qty 1
  Filled 2012-06-25 (×62): qty 2

## 2012-06-25 MED ORDER — SODIUM CHLORIDE 0.9 % IV SOLN: 250.0000 mL | INTRAVENOUS | Status: DC | PRN

## 2012-06-25 MED ORDER — SODIUM CHLORIDE 0.9 % IJ SOLN
3.0000 mL | INTRAMUSCULAR | Status: DC | PRN
  Administered 2012-06-27 – 2012-07-06 (×2): 3 mL via INTRAVENOUS

## 2012-06-25 MED ORDER — HYDRALAZINE HCL 25 MG PO TABS
25.0000 mg | ORAL_TABLET | Freq: Three times a day (TID) | ORAL | Status: DC
  Administered 2012-06-27 – 2012-07-01 (×5): 25 mg via ORAL
  Filled 2012-06-25 (×22): qty 1

## 2012-06-25 MED ORDER — MAGNESIUM OXIDE 420 MG PO TABS: 1.0000 | ORAL_TABLET | Freq: Every day | ORAL | Status: DC

## 2012-06-25 MED ORDER — NEPRO/CARBSTEADY PO LIQD
237.0000 mL | Freq: Two times a day (BID) | ORAL | Status: DC
  Administered 2012-06-27 – 2012-07-06 (×17): 237 mL via ORAL

## 2012-06-25 MED ORDER — SIMVASTATIN 10 MG PO TABS
10.0000 mg | ORAL_TABLET | Freq: Every day | ORAL | Status: DC
  Administered 2012-06-26 – 2012-07-06 (×11): 10 mg via ORAL
  Filled 2012-06-25 (×12): qty 1

## 2012-06-25 MED ORDER — METHADONE HCL 5 MG PO TABS
5.0000 mg | ORAL_TABLET | Freq: Three times a day (TID) | ORAL | Status: DC
  Administered 2012-06-25 – 2012-06-30 (×13): 5 mg via ORAL
  Filled 2012-06-25 (×13): qty 1

## 2012-06-25 MED ORDER — SULFAMETHOXAZOLE-TRIMETHOPRIM 800-160 MG PO TABS: 1.0000 | ORAL_TABLET | Freq: Two times a day (BID) | ORAL | Status: DC

## 2012-06-25 MED ORDER — HEPARIN SODIUM (PORCINE) 5000 UNIT/ML IJ SOLN
5000.0000 [IU] | Freq: Three times a day (TID) | INTRAMUSCULAR | Status: DC
  Filled 2012-06-25: qty 1

## 2012-06-25 MED ORDER — ISOSORBIDE MONONITRATE ER 30 MG PO TB24
30.0000 mg | ORAL_TABLET | Freq: Every day | ORAL | Status: DC
  Administered 2012-06-26 – 2012-07-07 (×12): 30 mg via ORAL
  Filled 2012-06-25 (×12): qty 1

## 2012-06-25 MED ORDER — SEVELAMER CARBONATE 800 MG PO TABS
400.0000 mg | ORAL_TABLET | Freq: Every day | ORAL | Status: DC
  Filled 2012-06-25 (×2): qty 0.5

## 2012-06-25 MED ORDER — CARVEDILOL 6.25 MG PO TABS
6.2500 mg | ORAL_TABLET | Freq: Two times a day (BID) | ORAL | Status: DC
  Administered 2012-06-26 – 2012-06-27 (×2): 6.25 mg via ORAL
  Filled 2012-06-25 (×5): qty 1

## 2012-06-25 MED ORDER — ONDANSETRON HCL 4 MG/2ML IJ SOLN
4.0000 mg | Freq: Once | INTRAMUSCULAR | Status: AC
  Administered 2012-06-25: 4 mg via INTRAVENOUS
  Filled 2012-06-25: qty 2

## 2012-06-25 MED ORDER — RENA-VITE PO TABS
1.0000 | ORAL_TABLET | Freq: Every day | ORAL | Status: DC
  Administered 2012-06-26 – 2012-07-06 (×11): 1 via ORAL
  Filled 2012-06-25 (×13): qty 1

## 2012-06-25 MED ORDER — PANTOPRAZOLE SODIUM 40 MG PO TBEC
40.0000 mg | DELAYED_RELEASE_TABLET | Freq: Every day | ORAL | Status: DC
  Administered 2012-06-26 – 2012-07-07 (×12): 40 mg via ORAL
  Filled 2012-06-25 (×10): qty 1

## 2012-06-25 MED ORDER — SODIUM CHLORIDE 0.9 % IJ SOLN
3.0000 mL | Freq: Two times a day (BID) | INTRAMUSCULAR | Status: DC
  Administered 2012-06-28 – 2012-06-29 (×4): 3 mL via INTRAVENOUS

## 2012-06-25 MED ORDER — ASPIRIN EC 81 MG PO TBEC
81.0000 mg | DELAYED_RELEASE_TABLET | Freq: Every day | ORAL | Status: DC
  Administered 2012-06-26 – 2012-07-07 (×11): 81 mg via ORAL
  Filled 2012-06-25 (×12): qty 1

## 2012-06-25 NOTE — ED Notes (Signed)
Patient said she only makes urine once a day, usually at night.

## 2012-06-25 NOTE — ED Notes (Signed)
Phlebotomy at the bedside  

## 2012-06-25 NOTE — ED Notes (Signed)
MD at bedside. 

## 2012-06-25 NOTE — ED Notes (Signed)
IV attempt x 1 more by this RN and twice by charge nurse

## 2012-06-25 NOTE — H&P (Addendum)
[facility] History and Physical  [name]:[id number] DOB: 06/29/1950 DOA: 06/25/2012  Referring physician: ED PCP: Jyl Heinz, MD  Specialists: Hyman Hopes of nephrology.  Chief Complaint: missed dialysis session  HPI: [name] is a 62 y.o. female who unfortunately missed her dialysis session yesterday.  Patient presented to the ED saying that she wasn't feeling well and has abdominal pain.  She has had similar complaints and presentations numerous times in the past.  She has known chronic pancreatitis with pseudocyst.  Denies any fever, no more SOB than normal, no N/V.  In the ED lab work was remarkable for elevated BUN but otherwise was unimpressive.  Dr. Hyman Hopes was called and recognized the patient by name, and stated that he would be in to dialyze her tomorrow.  Hospitalist has been asked to admit.  Review of Systems: 12 systems reviewed and otherwise negative.  Past Medical History  Diagnosis Date  . Gallstone pancreatitis     chronic pancreatitis s/p resection of gallbladder and panreas  . Coronary artery disease     s/p CABG 2008 with multiple PCIs  . Ischemic cardiomyopathy     Cath 04/2012, significant calcifications  . History of nonadherence to medical treatment   . Hyperlipidemia   . Systolic CHF     95/6213 echo EF 10%  . Esophageal varices   . Bacteremia     enterobacter 09/2011, MRSA 09/2011  . Fatty liver   . GIB (gastrointestinal bleeding)     per pt history   . 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 Perforated bowel     09/2011-no surgical intervention  . Pacemaker   . ICD (implantable cardiac defibrillator) in place   . Hypertension     "used to have HTN; now I'm low" (05/23/2012)  . CHF (congestive heart failure)   . Heart murmur     "just found that out this year" (05/23/2012)  . NSTEMI (non-ST elevated myocardial infarction)     04/2012; Trop peaked to 1.39; 05/23/2012 pt denies ever having MI  . Pneumonia 02/2012    "first time  ever" (05/23/2012)  . Chronic bronchitis 1970's thru 2002    "went away when I stopped smoking in 2002" (05/23/2012)  . Diabetes mellitus     Diet controlled  . History of blood transfusion     "alot of them" (05/23/2012)  . End stage renal disease     T,Th,Sat HD by [facility] (05/23/2012); h/o peritoneal dialysis prior; uses right chest cath for HD, left arm graft placed by [facility] but not ready for use  . Iron (Fe) deficiency anemia     "severe" (05/23/2012)  . Fatty liver     with cirrhosis and esophageal varices    Past Surgical History  Procedure Date  . Esophagogastroduodenoscopy 09/15/2011    Procedure: ESOPHAGOGASTRODUODENOSCOPY (EGD);  Surgeon: Theda Belfast, MD;  Location: [facility] ENDOSCOPY;  Service: Endoscopy;  Laterality: N/A;  . Cholecystectomy 2003  . Tonsillectomy and adenoidectomy 1961  . Appendectomy 1973?  [name] Vaginal hysterectomy 1973?  [name] Tubal ligation 1972  . Dilation and curettage of uterus     "bunch from profuse bleeding in the 1970's" (05/23/2012)  . Coronary artery bypass graft 2008    CABG X4  . Coronary angioplasty with stent placement 2008    "1; day after CABG" (05/23/2012)  . Cardiac catheterization     "before 2008 and 3 wk ago" (05/23/2012)  . Insert /  replace / remove pacemaker 06/2011    pacemaker ICD  . Cardiac defibrillator placement 06/2011  . Arteriovenous graft placement 03/2012    right antecub  . Insertion of dialysis catheter ~ 10/2011    right chest  . Peritoneal catheter insertion 08/2011  . Peritoneal catheter removal ~ 10/2011   Social History:  reports that she quit smoking about 12 years ago. Her smoking use included Cigarettes. She has a 10.23 pack-year smoking history. She has never used smokeless tobacco. She reports that she drinks alcohol. She reports that she does not use illicit drugs.   Allergies  Allergen Reactions  . Codeine Itching  . Morphine And Related Shortness Of Breath    SOB when given IV  once "to me it was mild; I called out fast for help; never had to intubate" (05/23/2012)  . Ace Inhibitors Other (See Comments)    Unknown reaction but her physician stated that she can't take it (specifically lisinopril)  . Tylenol (Acetaminophen) Other (See Comments)    Patient states that the doctor told her she has a Fatty Liver.    Family History  Problem Relation Age of Onset  . Coronary artery disease Mother   . Hypertension Mother   . Diabetes Mother   . Diabetes Sister   . Anesthesia problems Neg Hx    Prior to Admission medications   Medication Sig Start Date End Date Taking? Authorizing Provider  aspirin EC 81 MG EC tablet Take 1 tablet (81 mg total) by mouth daily. 05/11/12  Yes Larey Seat, MD  carvedilol (COREG) 6.25 MG tablet Take 1 tablet (6.25 mg total) by mouth 2 (two) times daily with a meal. 05/11/12  Yes Larey Seat, MD  darbepoetin (ARANESP) 100 MCG/0.5ML SOLN Inject 100 mcg into the vein See admin instructions. In dialysis 04/05/12  Yes Joseph Art, DO  hydrALAZINE (APRESOLINE) 25 MG tablet Take 1 tablet (25 mg total) by mouth every 8 (eight) hours. 05/11/12  Yes Larey Seat, MD  HYDROmorphone (DILAUDID) 2 MG tablet Take 1 mg by mouth every 2 (two) hours as needed. For pain 05/27/12  Yes Annett Gula, MD  HYDROmorphone (DILAUDID) 4 MG tablet Take 1 tablet (4 mg total) by mouth every 4 (four) hours. 05/27/12  Yes Annett Gula, MD  isosorbide mononitrate (IMDUR) 30 MG 24 hr tablet Take 1 tablet (30 mg total) by mouth daily. 05/11/12  Yes Larey Seat, MD  Magnesium Oxide 420 MG TABS Take 1 tablet by mouth daily.   Yes Historical Provider, MD  methadone (DOLOPHINE) 5 MG tablet Take 1 tablet (5 mg total) by mouth 3 (three) times daily. Takes along with Dilaudid. 05/27/12  Yes Annett Gula, MD  multivitamin (RENA-VIT) TABS tablet Take 1 tablet by mouth daily. 10/14/11  Yes Cl[place]ford Cyndie Mull, MD  nitroGLYCERIN (NITROSTAT) 0.4 MG SL tablet Place 1 tablet (0.4  mg total) under the tongue every 5 (five) minutes as needed for chest pain (CP or SOB). 06/03/12  Yes Annett Gula, MD  Nutritional Supplements (FEEDING SUPPLEMENT, NEPRO CARB STEADY,) LIQD Take 237 mLs by mouth 2 (two) times daily with breakfast and lunch. 03/04/12  Yes Srikar Cherlynn Kaiser, MD  pantoprazole (PROTONIX) 40 MG tablet Take 40 mg by mouth daily.   Yes Historical Provider, MD  pravastatin (PRAVACHOL) 20 MG tablet Take 20 mg by mouth at bedtime.    Yes Historical Provider, MD  ranolazine (RANEXA) 500 MG 12 hr tablet Take 500 mg  by mouth daily.    Yes Historical Provider, MD  sevelamer (RENAGEL) 400 MG tablet Take 400 mg by mouth daily. With a meal   Yes Historical Provider, MD  sulfamethoxazole-trimethoprim (SEPTRA DS) 800-160 MG per tablet Take 1 tablet by mouth 2 (two) times daily. 06/03/12  Yes Annett Gula, MD   Physical Exam: Filed Vitals:   06/25/12 1926 06/25/12 1930 06/25/12 2000 06/25/12 2015  BP: 113/79 114/72 122/81 122/67  Pulse: 74 73 70 74  Temp: 97.9 F (36.6 C)     TempSrc: Oral     Resp: 16 16 18 16   SpO2: 100% 99% 96% 95%    General:  NAD, resting comfortably in bed Eyes: PEERLA EOMI ENT: mucous membranes moist Neck: supple w/o JVD Cardiovascular: RRR w/o MRG Respiratory: CTA B Abdomen: soft, nt, nd, bs+ Skin: no rash nor lesion Musculoskeletal: MAE, full ROM all 4 extremities Psychiatric: normal tone and affect Neurologic: AAOx3, grossly non-focal  Labs on Admission:  Basic Metabolic Panel:  Lab 06/25/12 1610  NA 130*  K 5.4*  CL 92*  CO2 22  GLUCOSE 64*  BUN 60*  CREATININE 6.17*  CALCIUM 11.2*  MG --  PHOS --   Liver Function Tests:  Lab 06/25/12 1726  AST 14  ALT 5  ALKPHOS 118*  BILITOT 0.3  PROT 7.3  ALBUMIN 2.0*    Lab 06/25/12 1846  LIPASE 8*  AMYLASE --   No results found for this basename: AMMONIA:5 in the last 168 hours CBC:  Lab 06/25/12 1726  WBC 9.7  NEUTROABS 7.2  HGB 9.5*  HCT 30.7*  MCV 89.0  PLT 205    Cardiac Enzymes: No results found for this basename: CKTOTAL:5,CKMB:5,CKMBINDEX:5,TROPONINI:5 in the last 168 hours  BNP (last 3 results)  Basename 06/02/12 1944 05/08/12 0515 04/21/12 1419  PROBNP >70000.0* >70000.0* >70000.0*   CBG: No results found for this basename: GLUCAP:5 in the last 168 hours  Radiological Exams on Admission: Dg Chest 2 View  06/25/2012  *RADIOLOGY REPORT*  Clinical Data: Abdominal pain  CHEST - 2 VIEW  Comparison: 06/02/2012  Findings: There is a right chest wall dialysis catheter with tip in the right atrium.  Left chest wall AICD is noted with leads in the right atrial appendage, coronary sinus and right ventricle. Previous median sternotomy and CABG procedure.  There is marked cardiac enlargement. The small pleural effusions and mild interstitial edema noted.  IMPRESSION:  1.  Marked cardiac enlargement. 2.  Small pleural effusions and mild interstitial edema suggestive of CHF.   Original Report Authenticated By: Signa Kell, M.D.     EKG: Independently reviewed.  Assessment/Plan Active Problems:  Pancreatitis, chronic  ESRD on hemodialysis   1. ESRD - missed dialysis session yesterday, Dr. Hyman Hopes said he would come in to dialyze her tomorrow, admitting her to the floor in the meantime. 2. Abd pain - likely secondary to chronic pancreatitis, lab workup and exam wasn't really remarkable for any other concerning abdominal findings.  Also the patient is stating to RN that she is hungry.  Will observe and if new symptoms develop during her stay work up further.  Will go ahead and check a lactate and a troponin as well just in case as she has a well known history of severe CAD with ischemic cardiomyopathy (EF = 10% at baseline). 3. CAD 4. Chronic systolic CHF  Dr. Hyman Hopes of nephrology will be in to dialyze her tomorrow.  Code Status: Full Code (must indicate code status--if unknown  or must be presumed, indicate so) Family Communication: No family in room  (indicate person spoken with, if applicable, with phone number if by telephone) Disposition Plan: Admit to obs (indicate anticipated LOS)  Time spent: 70 min  GARDNER, JARED M. [facility] Pager [phone]  If 7PM-7AM, please contact night-coverage [email or web address] Password [facility] 06/25/2012, 11:02 PM

## 2012-06-25 NOTE — ED Notes (Signed)
IV attempt x 2 unsuccessful.

## 2012-06-25 NOTE — ED Notes (Signed)
Dr. Beaton at the bedside. 

## 2012-06-25 NOTE — ED Notes (Signed)
Pt transported to xray 

## 2012-06-25 NOTE — ED Provider Notes (Signed)
History     CSN: [id number]  Arrival date & time 06/25/12  1656   First MD Initiated Contact with Patient 06/25/12 1711      Chief Complaint  Patient presents with  . Failure To Thrive    HPI Brought in by ambulance from home after she missed dialysis today.  Patient states she wasn't feeling well and had abdominal pain.  Patient's had similar complaints in the past.  She has known chronic pancreatitis in the pseudocyst.  Denies any fever.  Denies more shortness of breath and normal.  Denies nausea vomiting or diarrhea. Past Medical History  Diagnosis Date  . Gallstone pancreatitis     chronic pancreatitis s/p resection of gallbladder and panreas  . Coronary artery disease     s/p CABG 2008 with multiple PCIs  . Ischemic cardiomyopathy     Cath 04/2012, significant calcifications  . History of nonadherence to medical treatment   . Hyperlipidemia   . Systolic CHF     40/9811 echo EF 10%  . Esophageal varices   . Bacteremia     enterobacter 09/2011, MRSA 09/2011  . Fatty liver   . GIB (gastrointestinal bleeding)     per pt history   . 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 Perforated bowel     09/2011-no surgical intervention  . Pacemaker   . ICD (implantable cardiac defibrillator) in place   . Hypertension     "used to have HTN; now I'm low" (05/23/2012)  . CHF (congestive heart failure)   . Heart murmur     "just found that out this year" (05/23/2012)  . NSTEMI (non-ST elevated myocardial infarction)     04/2012; Trop peaked to 1.39; 05/23/2012 pt denies ever having MI  . Pneumonia 02/2012    "first time ever" (05/23/2012)  . Chronic bronchitis 1970's thru 2002    "went away when I stopped smoking in 2002" (05/23/2012)  . Diabetes mellitus     Diet controlled  . History of blood transfusion     "alot of them" (05/23/2012)  . End stage renal disease     T,Th,Sat HD by [facility] on [facility] (05/23/2012); h/o peritoneal dialysis prior; uses right chest cath  for HD, left arm graft placed by [facility] but not ready for use  . Iron (Fe) deficiency anemia     "severe" (05/23/2012)  . Fatty liver     with cirrhosis and esophageal varices     Past Surgical History  Procedure Date  . Esophagogastroduodenoscopy 09/15/2011    Procedure: ESOPHAGOGASTRODUODENOSCOPY (EGD);  Surgeon: Theda Belfast, MD;  Location: [facility] ENDOSCOPY;  Service: Endoscopy;  Laterality: N/A;  . Cholecystectomy 2003  . Tonsillectomy and adenoidectomy 1961  . Appendectomy 1973?  [name] Vaginal hysterectomy 1973?  [name] Tubal ligation 1972  . Dilation and curettage of uterus     "bunch from profuse bleeding in the 1970's" (05/23/2012)  . Coronary artery bypass graft 2008    CABG X4  . Coronary angioplasty with stent placement 2008    "1; day after CABG" (05/23/2012)  . Cardiac catheterization     "before 2008 and 3 wk ago" (05/23/2012)  . Insert / replace / remove pacemaker 06/2011    pacemaker ICD  . Cardiac defibrillator placement 06/2011  . Arteriovenous graft placement 03/2012    right antecub  . Insertion of dialysis catheter ~ 10/2011    right chest  . Peritoneal catheter insertion 08/2011  . Peritoneal catheter  removal ~ 10/2011    Family History  Problem Relation Age of Onset  . Coronary artery disease Mother   . Hypertension Mother   . Diabetes Mother   . Diabetes Sister   . Anesthesia problems Neg Hx     History  Substance Use Topics  . Smoking status: Former Smoker -- 0.3 packs/day for 31 years    Types: Cigarettes    Quit date: 06/15/2000  . Smokeless tobacco: Never Used  . Alcohol Use: Yes     Comment: 05/23/2012 "drank from age [age]; never had problem w/it"    OB History    Grav Para Term Preterm Abortions TAB SAB Ect Mult Living                  Review of Systems All other systems reviewed and are negative Allergies  Codeine; Morphine and related; Ace inhibitors; and Tylenol  Home Medications   No current outpatient prescriptions on file.  BP  126/85  Pulse 81  Temp 97.8 F (36.6 C) (Oral)  Resp 17  Ht 5\' 4"  (1.626 m)  Wt 116 lb 2.9 oz (52.7 kg)  BMI 19.94 kg/m2  SpO2 95%  Physical Exam  Nursing note and vitals reviewed. Constitutional: She is oriented to person, place, and time. She appears well-developed and well-nourished. No distress.  HENT:  Head: Normocephalic and atraumatic.  Eyes: Pupils are equal, round, and reactive to light.  Neck: Normal range of motion.  Cardiovascular: Normal rate and intact distal pulses.   Pulmonary/Chest: No respiratory distress.  Abdominal: Normal appearance and bowel sounds are normal. She exhibits no distension. There is tenderness. There is guarding.         Pain to palpation were indicated  Musculoskeletal: Normal range of motion.  Neurological: She is alert and oriented to person, place, and time. No cranial nerve deficit.  Skin: Skin is warm and dry. No rash noted.  Psychiatric: She has a normal mood and affect. Her behavior is normal.    ED Course  Procedures (including critical care time)  Date: 06/25/2012  Rate: 84  Rhythm: normal sinus rhythm  QRS Axis: Left  Intervals: normal  ST/T Wave abnormalities: normal  Conduction Disutrbances:  Left bundle-branch block  Narrative Interpretation: Unchanged when compared with previous EKG     Labs Reviewed  COMPREHENSIVE METABOLIC PANEL - Abnormal; Notable for the following:    Sodium 130 (*)     Potassium 5.4 (*)     Chloride 92 (*)     Glucose, Bld 64 (*)     BUN 60 (*)     Creatinine, Ser 6.17 (*)     Calcium 11.2 (*)     Albumin 2.0 (*)     Alkaline Phosphatase 118 (*)     GFR calc non Af Amer 7 (*)     GFR calc Af Amer 8 (*)     All other components within normal limits  CBC WITH DIFFERENTIAL - Abnormal; Notable for the following:    RBC 3.45 (*)     Hemoglobin 9.5 (*)     HCT 30.7 (*)     RDW 17.9 (*)     All other components within normal limits  URINALYSIS, ROUTINE W REFLEX MICROSCOPIC - Abnormal;  Notable for the following:    APPearance CLOUDY (*)     Hgb urine dipstick MODERATE (*)     Bilirubin Urine SMALL (*)     Protein, ur >300 (*)     Leukocytes,  UA SMALL (*)     All other components within normal limits  LIPASE, BLOOD - Abnormal; Notable for the following:    Lipase 8 (*)     All other components within normal limits  CBC - Abnormal; Notable for the following:    RBC 3.75 (*)     Hemoglobin 10.2 (*)     HCT 33.2 (*)     RDW 17.7 (*)     All other components within normal limits  CREATININE, SERUM - Abnormal; Notable for the following:    Creatinine, Ser 6.37 (*)     GFR calc non Af Amer 6 (*)     GFR calc Af Amer 7 (*)     All other components within normal limits  CBC - Abnormal; Notable for the following:    RBC 3.49 (*)     Hemoglobin 9.6 (*)     HCT 31.1 (*)     RDW 17.8 (*)     All other components within normal limits  BASIC METABOLIC PANEL - Abnormal; Notable for the following:    Sodium 132 (*)     Potassium 6.0 (*)     BUN 67 (*)     Creatinine, Ser 6.62 (*)     Calcium 10.7 (*)     GFR calc non Af Amer 6 (*)     GFR calc Af Amer 7 (*)     All other components within normal limits  URINE MICROSCOPIC-ADD ON - Abnormal; Notable for the following:    Squamous Epithelial / LPF MANY (*)     All other components within normal limits  TROPONIN I  TROPONIN I  TROPONIN I  LACTIC ACID, PLASMA  GLUCOSE, CAPILLARY  MRSA PCR SCREENING  HEPATITIS B SURFACE ANTIGEN  URINE CULTURE  RENAL FUNCTION PANEL   Dg Chest 2 View  06/25/2012  *RADIOLOGY REPORT*  Clinical Data: Abdominal pain  CHEST - 2 VIEW  Comparison: 06/02/2012  Findings: There is a right chest wall dialysis catheter with tip in the right atrium.  Left chest wall AICD is noted with leads in the right atrial appendage, coronary sinus and right ventricle. Previous median sternotomy and CABG procedure.  There is marked cardiac enlargement. The small pleural effusions and mild interstitial edema noted.   IMPRESSION:  1.  Marked cardiac enlargement. 2.  Small pleural effusions and mild interstitial edema suggestive of CHF.   Original Report Authenticated By: Signa Kell, M.D.    Ct Abdomen Pelvis W Contrast  06/26/2012  *RADIOLOGY REPORT*  Clinical Data: Worsening abdominal pain.  The patient has a history of complex infected pseudocyst with pancreatic necrosis.  Last admission 05/22/2012.  History of chronic pancreatitis.  CT ABDOMEN AND PELVIS WITH CONTRAST  Technique:  Multidetector CT imaging of the abdomen and pelvis was performed following the standard protocol during bolus administration of intravenous contrast.  Contrast: 80mL OMNIPAQUE IOHEXOL 300 MG/ML  SOLN  Comparison: 05/24/2012  Findings: Small right pleural effusion.  Atelectasis or infiltration in both lung bases.  Diffuse cardiac enlargement. Postoperative changes in the mediastinum.  There is diffuse abdominal and pelvic fluid consistent with ascites, increasing since previous study.  Diffuse subcutaneous soft tissue edema.  The liver demonstrates diffuse central hypo perfusion pattern similar to previous study.  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  Spleen size is normal.  Gallbladder is surgically absent.  Visualization of the pancreas is limited due to obscuration by the surrounding fluid.  There appear to be gas collections  throughout the pancreas or peripancreatic tissues with gas and fluid collection again demonstrated at the left upper quadrant extending from the region of the pancreatic tail into the splenic hilum.  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  Gas and fluid collections appear to be increasing mildly since the previous study. No discrete drainable collection is identified.  Visualization of bowel is limited due to limited opacification.  However, there is suggestion of mild wall thickening consistent with edema in the jejunum.  Contrast material does pass through the colon suggesting  no evidence of obstruction. The stomach is decompressed.  The colon is stool-filled.  The kidneys demonstrate small parenchymal cysts.  There is no hydronephrosis.  There is no contrast material shown in the renal collecting systems on the delayed images, suggesting possible delayed appearance but delayed images were obtained only 2-3 minutes after arterial phase in this may be artifactual.  Delayed appearance of contrast material in the renal collecting system cannot suggest renal hypoperfusion or medical renal disease.  Ascites extends into the pelvis.  The bladder is decompressed. Pelvic organs are not visualized.  Scattered diverticula in the sigmoid colon without definite diverticulitis.  The appendix is normal.  Normal alignment of the lumbar vertebrae without compression deformity or expansile destructive change.  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  Ascites.  Abnormal hepatic parenchymal perfusion with diffuse portal venous thrombosis.  Changes have progressed since previous study.  Probable edema in the jejunum which might be related to ascites.  Delayed appearance of contrast material in the renal collecting systems suggest renal parenchymal disease.   Original Report Authenticated By: Burman Nieves, M.D.      1. Renal failure   2. Abdominal pain   3. CAD (coronary artery disease)   4. Chronic systolic congestive heart failure, NYHA class 3       MDM   I spoke with nephrology recommended she be admitted for dialysis tomorrow.       Nelia Shi, MD 06/27/12 1311

## 2012-06-25 NOTE — ED Notes (Signed)
Per [facility], pt from home missed dialysis today because she hasn't been feeling good past few days. Pt lives by herself and is alert and oriented. CBG 69

## 2012-06-26 ENCOUNTER — Encounter (HOSPITAL_COMMUNITY): Payer: Self-pay | Admitting: General Practice

## 2012-06-26 ENCOUNTER — Observation Stay (HOSPITAL_COMMUNITY): Payer: Non-veteran care

## 2012-06-26 DIAGNOSIS — I5022 Chronic systolic (congestive) heart failure: Secondary | ICD-10-CM

## 2012-06-26 DIAGNOSIS — R109 Unspecified abdominal pain: Secondary | ICD-10-CM

## 2012-06-26 DIAGNOSIS — I2510 Atherosclerotic heart disease of native coronary artery without angina pectoris: Secondary | ICD-10-CM

## 2012-06-26 DIAGNOSIS — I509 Heart failure, unspecified: Secondary | ICD-10-CM

## 2012-06-26 LAB — CBC
Hemoglobin: 9.6 g/dL — ABNORMAL LOW (ref 12.0–15.0)
RBC: 3.49 MIL/uL — ABNORMAL LOW (ref 3.87–5.11)
WBC: 9.5 10*3/uL (ref 4.0–10.5)

## 2012-06-26 LAB — CREATININE, SERUM
GFR calc Af Amer: 7 mL/min — ABNORMAL LOW (ref >90)
GFR calc non Af Amer: 6 mL/min — ABNORMAL LOW (ref >90)

## 2012-06-26 LAB — TROPONIN I
Troponin I: <0.3 ng/mL (ref <0.30)
Troponin I: <0.3 ng/mL (ref <0.30)

## 2012-06-26 LAB — BASIC METABOLIC PANEL
CO2: 21 mEq/L (ref 19–32)
GFR calc non Af Amer: 6 mL/min — ABNORMAL LOW (ref >90)
Glucose, Bld: 86 mg/dL (ref 70–99)
Potassium: 6 mEq/L — ABNORMAL HIGH (ref 3.5–5.1)
Sodium: 132 mEq/L — ABNORMAL LOW (ref 135–145)

## 2012-06-26 LAB — MRSA PCR SCREENING: MRSA by PCR: NEGATIVE

## 2012-06-26 LAB — HEPATITIS B SURFACE ANTIGEN: Hepatitis B Surface Ag: NEGATIVE

## 2012-06-26 MED ORDER — SODIUM CHLORIDE 0.9 % IV SOLN: 100.0000 mL | INTRAVENOUS | Status: DC | PRN

## 2012-06-26 MED ORDER — NEPRO/CARBSTEADY PO LIQD: 237.0000 mL | ORAL | Status: DC | PRN

## 2012-06-26 MED ORDER — PENTAFLUOROPROP-TETRAFLUOROETH EX AERO: 1.0000 "application " | INHALATION_SPRAY | CUTANEOUS | Status: DC | PRN

## 2012-06-26 MED ORDER — IOHEXOL 300 MG/ML  SOLN
80.0000 mL | Freq: Once | INTRAMUSCULAR | Status: AC | PRN
  Administered 2012-06-26: 80 mL via INTRAVENOUS

## 2012-06-26 MED ORDER — HYDROMORPHONE HCL 2 MG PO TABS
ORAL_TABLET | ORAL | Status: AC
  Administered 2012-06-26: 4 mg via ORAL
  Filled 2012-06-26: qty 2

## 2012-06-26 MED ORDER — HEPARIN SODIUM (PORCINE) 1000 UNIT/ML DIALYSIS: 1000.0000 [IU] | INTRAMUSCULAR | Status: DC | PRN

## 2012-06-26 MED ORDER — DARBEPOETIN ALFA-POLYSORBATE 100 MCG/0.5ML IJ SOLN
100.0000 ug | Freq: Once | INTRAMUSCULAR | Status: AC
  Administered 2012-06-26: 100 ug via INTRAVENOUS
  Filled 2012-06-26: qty 0.5

## 2012-06-26 MED ORDER — HEPARIN SODIUM (PORCINE) 5000 UNIT/ML IJ SOLN
5000.0000 [IU] | Freq: Three times a day (TID) | INTRAMUSCULAR | Status: DC
  Administered 2012-06-26 – 2012-06-27 (×5): 5000 [IU] via SUBCUTANEOUS
  Filled 2012-06-26 (×7): qty 1

## 2012-06-26 MED ORDER — MAGNESIUM OXIDE 400 (241.3 MG) MG PO TABS
400.0000 mg | ORAL_TABLET | Freq: Every day | ORAL | Status: DC
  Administered 2012-06-27 – 2012-07-04 (×8): 400 mg via ORAL
  Filled 2012-06-26 (×10): qty 1

## 2012-06-26 MED ORDER — ONDANSETRON HCL 4 MG/2ML IJ SOLN
4.0000 mg | Freq: Once | INTRAMUSCULAR | Status: AC
  Administered 2012-06-26: 4 mg via INTRAVENOUS

## 2012-06-26 MED ORDER — LIDOCAINE HCL (PF) 1 % IJ SOLN: 5.0000 mL | INTRAMUSCULAR | Status: DC | PRN

## 2012-06-26 MED ORDER — HYDROMORPHONE HCL PF 1 MG/ML IJ SOLN
1.0000 mg | INTRAMUSCULAR | Status: DC | PRN
  Administered 2012-06-26 – 2012-06-27 (×3): 1 mg via INTRAVENOUS
  Filled 2012-06-26 (×3): qty 1

## 2012-06-26 MED ORDER — SULFAMETHOXAZOLE-TMP DS 800-160 MG PO TABS
2.0000 | ORAL_TABLET | Freq: Every day | ORAL | Status: DC
  Administered 2012-06-26 – 2012-06-28 (×4): 2 via ORAL
  Filled 2012-06-26 (×5): qty 2

## 2012-06-26 MED ORDER — ALTEPLASE 2 MG IJ SOLR: 2.0000 mg | Freq: Once | INTRAMUSCULAR | Status: DC | PRN

## 2012-06-26 MED ORDER — ONDANSETRON HCL 4 MG/2ML IJ SOLN
INTRAMUSCULAR | Status: AC
  Administered 2012-06-26: 4 mg via INTRAVENOUS
  Filled 2012-06-26: qty 2

## 2012-06-26 MED ORDER — IOHEXOL 300 MG/ML  SOLN
20.0000 mL | INTRAMUSCULAR | Status: AC
  Administered 2012-06-26 (×2): 20 mL via ORAL

## 2012-06-26 MED ORDER — SEVELAMER HCL 400 MG PO TABS
400.0000 mg | ORAL_TABLET | Freq: Every day | ORAL | Status: DC
  Administered 2012-06-26 – 2012-07-06 (×8): 400 mg via ORAL
  Filled 2012-06-26 (×13): qty 1

## 2012-06-26 MED ORDER — LIDOCAINE-PRILOCAINE 2.5-2.5 % EX CREA: 1.0000 "application " | TOPICAL_CREAM | CUTANEOUS | Status: DC | PRN

## 2012-06-26 MED ORDER — DARBEPOETIN ALFA-POLYSORBATE 100 MCG/0.5ML IJ SOLN
INTRAMUSCULAR | Status: AC
  Administered 2012-06-26: 100 ug via INTRAVENOUS
  Filled 2012-06-26: qty 0.5

## 2012-06-26 NOTE — Procedures (Signed)
Patient seen on dialysis. I have seen and examined this patient and agree with the plan of care. Patient admitted for abdominal pain and nephrology consulted for Evaluation for management of ESRD. Vitals are stable with no distress and well controlled volume status. Evaluation of out patient records indicates that he receives dialysis TTS at [facility].She  is dialyzed for 4 hrs  on a 180 non-reuse dialyzer with a 2.25 calcium and 2 potassium dialysate and linear sodium 138.She receives erythropoietin and iron to keep his Hb above 10 and IV Vitamin D and phosphate binders to keep his Bone mineral goals at [employer]. She has a functioning Catheter and maturing AVF with no problems during usage. We shall proceed with in hospital scheduled dialysis. She missed Saturday treatment and potassium is 6 this morning we will use a 1 K dialysate today.   Mykel Mohl W 06/26/2012, 8:54 AM

## 2012-06-26 NOTE — Consult Note (Signed)
[facility] Renal Consultation Note  Indication for Consultation:  Management of ESRD/hemodialysis; anemia, hypertension/volume and secondary hyperparathyroidism  HPI: [name] is a 62 y.o. female with ESRD on dialysis on TTS at the [facility] who came to the ED yesterday for abdominal pain, dizziness, and bilateral leg weakness, worsening since Wednesday 1/8.  She denies any nausea, vomiting, fever, or chills and reports no change in regular bowel movements.  However, she has a history of chronic pancreatitis and fatty liver with cirrhosis and esophageal varices.  She is also noncompliant with her dialysis treatments and missed her last two treatments on 1/7 and 1/9, but states that her current symptoms prevented her from leaving home.  Chest x-ray showed marked cardiac enlargement with small pleural effusions and mild interstitial edema.  She has a history of ischemic cardiomyopathy and received a pacemaker early last year, but was hospitalized twice in December for evaluation of chest pain.   Dialysis Orders: Center: [facility] on TTS. Remaining dialysis information will be obtained from center tomorrow.  Past Medical History  Diagnosis Date  . Gallstone pancreatitis     chronic pancreatitis s/p resection of gallbladder and panreas  . Coronary artery disease     s/p CABG 2008 with multiple PCIs  . Ischemic cardiomyopathy     Cath 04/2012, significant calcifications  . History of nonadherence to medical treatment   . Hyperlipidemia   . Systolic CHF     40/9811 echo EF 10%  . Esophageal varices   . Bacteremia     enterobacter 09/2011, MRSA 09/2011  . Fatty liver   . GIB (gastrointestinal bleeding)     per pt history   . 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 Perforated bowel     09/2011-no surgical intervention  . Pacemaker   . ICD (implantable cardiac defibrillator) in place   . Hypertension     "used to have HTN; now I'm low" (05/23/2012)  . CHF  (congestive heart failure)   . Heart murmur     "just found that out this year" (05/23/2012)  . NSTEMI (non-ST elevated myocardial infarction)     04/2012; Trop peaked to 1.39; 05/23/2012 pt denies ever having MI  . Pneumonia 02/2012    "first time ever" (05/23/2012)  . Chronic bronchitis 1970's thru 2002    "went away when I stopped smoking in 2002" (05/23/2012)  . Diabetes mellitus     Diet controlled  . History of blood transfusion     "alot of them" (05/23/2012)  . End stage renal disease     T,Th,Sat HD by [facility] on [facility] (05/23/2012); h/o peritoneal dialysis prior; uses right chest cath for HD, left arm graft placed by [facility] but not ready for use  . Iron (Fe) deficiency anemia     "severe" (05/23/2012)  . Fatty liver     with cirrhosis and esophageal varices    Past Surgical History  Procedure Date  . Esophagogastroduodenoscopy 09/15/2011    Procedure: ESOPHAGOGASTRODUODENOSCOPY (EGD);  Surgeon: Theda Belfast, MD;  Location: [facility] ENDOSCOPY;  Service: Endoscopy;  Laterality: N/A;  . Cholecystectomy 2003  . Tonsillectomy and adenoidectomy 1961  . Appendectomy 1973?  [name] Vaginal hysterectomy 1973?  [name] Tubal ligation 1972  . Dilation and curettage of uterus     "bunch from profuse bleeding in the 1970's" (05/23/2012)  . Coronary artery bypass graft 2008    CABG X4  . Coronary angioplasty with stent placement  2008    "1; day after CABG" (05/23/2012)  . Cardiac catheterization     "before 2008 and 3 wk ago" (05/23/2012)  . Insert / replace / remove pacemaker 06/2011    pacemaker ICD  . Cardiac defibrillator placement 06/2011  . Arteriovenous graft placement 03/2012    right antecub  . Insertion of dialysis catheter ~ 10/2011    right chest  . Peritoneal catheter insertion 08/2011  . Peritoneal catheter removal ~ 10/2011   Family History  Problem Relation Age of Onset  . Coronary artery disease Mother   . Hypertension Mother   . Diabetes Mother   . Diabetes Sister    . Anesthesia problems Neg Hx    Social History She quit smoking cigarettes in 2002 after smoking a pack every three days. She has never used smokeless tobacco. She previously drank occasional alcohol, but denies any history of illicit drugs. She spent ten years in the [employer] and also worked as a Engineer, civil (consulting).  She has never been married and currently lives alone.  Allergies  Allergen Reactions  . Codeine Itching  . Morphine And Related Shortness Of Breath    SOB when given IV once "to me it was mild; I called out fast for help; never had to intubate" (05/23/2012)  . Ace Inhibitors Other (See Comments)    Unknown reaction but her physician stated that she can't take it (specifically lisinopril)  . Tylenol (Acetaminophen) Other (See Comments)    Patient states that the doctor told her she has a Fatty Liver.   Prior to Admission medications   Medication Sig Start Date End Date Taking? Authorizing Provider  aspirin EC 81 MG EC tablet Take 1 tablet (81 mg total) by mouth daily. 05/11/12  Yes Larey Seat, MD  carvedilol (COREG) 6.25 MG tablet Take 1 tablet (6.25 mg total) by mouth 2 (two) times daily with a meal. 05/11/12  Yes Larey Seat, MD  darbepoetin (ARANESP) 100 MCG/0.5ML SOLN Inject 100 mcg into the vein See admin instructions. In dialysis 04/05/12  Yes Joseph Art, DO  hydrALAZINE (APRESOLINE) 25 MG tablet Take 1 tablet (25 mg total) by mouth every 8 (eight) hours. 05/11/12  Yes Larey Seat, MD  HYDROmorphone (DILAUDID) 2 MG tablet Take 1 mg by mouth every 2 (two) hours as needed. For pain 05/27/12  Yes Annett Gula, MD  HYDROmorphone (DILAUDID) 4 MG tablet Take 1 tablet (4 mg total) by mouth every 4 (four) hours. 05/27/12  Yes Annett Gula, MD  isosorbide mononitrate (IMDUR) 30 MG 24 hr tablet Take 1 tablet (30 mg total) by mouth daily. 05/11/12  Yes Larey Seat, MD  Magnesium Oxide 420 MG TABS Take 1 tablet by mouth daily.   Yes Historical Provider, MD  methadone (DOLOPHINE) 5  MG tablet Take 1 tablet (5 mg total) by mouth 3 (three) times daily. Takes along with Dilaudid. 05/27/12  Yes Annett Gula, MD  multivitamin (RENA-VIT) TABS tablet Take 1 tablet by mouth daily. 10/14/11  Yes Clanford Cyndie Mull, MD  nitroGLYCERIN (NITROSTAT) 0.4 MG SL tablet Place 1 tablet (0.4 mg total) under the tongue every 5 (five) minutes as needed for chest pain (CP or SOB). 06/03/12  Yes Annett Gula, MD  Nutritional Supplements (FEEDING SUPPLEMENT, NEPRO CARB STEADY,) LIQD Take 237 mLs by mouth 2 (two) times daily with breakfast and lunch. 03/04/12  Yes Srikar Cherlynn [facility], MD  pantoprazole (PROTONIX) 40 MG tablet Take 40 mg by mouth daily.  Yes Historical Provider, MD  pravastatin (PRAVACHOL) 20 MG tablet Take 20 mg by mouth at bedtime.    Yes Historical Provider, MD  ranolazine (RANEXA) 500 MG 12 hr tablet Take 500 mg by mouth daily.    Yes Historical Provider, MD  sevelamer (RENAGEL) 400 MG tablet Take 400 mg by mouth daily. With a meal   Yes Historical Provider, MD  sulfamethoxazole-trimethoprim (SEPTRA DS) 800-160 MG per tablet Take 1 tablet by mouth 2 (two) times daily. 06/03/12  Yes Annett Gula, MD   Labs:  Results for orders placed during the hospital encounter of 06/25/12 (from the past 48 hour(s))  COMPREHENSIVE METABOLIC PANEL     Status: Abnormal   Collection Time   06/25/12  5:26 PM      Component Value Range Comment   Sodium 130 (*) 135 - 145 mEq/L    Potassium 5.4 (*) 3.5 - 5.1 mEq/L    Chloride 92 (*) 96 - 112 mEq/L    CO2 22  19 - 32 mEq/L    Glucose, Bld 64 (*) 70 - 99 mg/dL    BUN 60 (*) 6 - 23 mg/dL    Creatinine, Ser 1.91 (*) 0.50 - 1.10 mg/dL    Calcium 47.8 (*) 8.4 - 10.5 mg/dL    Total Protein 7.3  6.0 - 8.3 g/dL    Albumin 2.0 (*) 3.5 - 5.2 g/dL    AST 14  0 - 37 U/L    ALT 5  0 - 35 U/L    Alkaline Phosphatase 118 (*) 39 - 117 U/L    Total Bilirubin 0.3  0.3 - 1.2 mg/dL    GFR calc non Af Amer 7 (*) >90 mL/min    GFR calc Af Amer 8 (*) >90 mL/min   CBC  WITH DIFFERENTIAL     Status: Abnormal   Collection Time   06/25/12  5:26 PM      Component Value Range Comment   WBC 9.7  4.0 - 10.5 K/uL    RBC 3.45 (*) 3.87 - 5.11 MIL/uL    Hemoglobin 9.5 (*) 12.0 - 15.0 g/dL    HCT 29.5 (*) 62.1 - 46.0 %    MCV 89.0  78.0 - 100.0 fL    MCH 27.5  26.0 - 34.0 pg    MCHC 30.9  30.0 - 36.0 g/dL    RDW 30.8 (*) 65.7 - 15.5 %    Platelets 205  150 - 400 K/uL    Neutrophils Relative 74  43 - 77 %    Neutro Abs 7.2  1.7 - 7.7 K/uL    Lymphocytes Relative 16  12 - 46 %    Lymphs Abs 1.6  0.7 - 4.0 K/uL    Monocytes Relative 8  3 - 12 %    Monocytes Absolute 0.8  0.1 - 1.0 K/uL    Eosinophils Relative 1  0 - 5 %    Eosinophils Absolute 0.1  0.0 - 0.7 K/uL    Basophils Relative 0  0 - 1 %    Basophils Absolute 0.0  0.0 - 0.1 K/uL   LIPASE, BLOOD     Status: Abnormal   Collection Time   06/25/12  6:46 PM      Component Value Range Comment   Lipase 8 (*) 11 - 59 U/L   GLUCOSE, CAPILLARY     Status: Normal   Collection Time   06/25/12 11:15 PM      Component Value Range  Comment   Glucose-Capillary 81  70 - 99 mg/dL   CBC     Status: Abnormal   Collection Time   06/25/12 11:28 PM      Component Value Range Comment   WBC 10.3  4.0 - 10.5 K/uL    RBC 3.75 (*) 3.87 - 5.11 MIL/uL    Hemoglobin 10.2 (*) 12.0 - 15.0 g/dL    HCT 65.7 (*) 84.6 - 46.0 %    MCV 88.5  78.0 - 100.0 fL    MCH 27.2  26.0 - 34.0 pg    MCHC 30.7  30.0 - 36.0 g/dL    RDW 96.2 (*) 95.2 - 15.5 %    Platelets 232  150 - 400 K/uL   CREATININE, SERUM     Status: Abnormal   Collection Time   06/25/12 11:28 PM      Component Value Range Comment   Creatinine, Ser 6.37 (*) 0.50 - 1.10 mg/dL    GFR calc non Af Amer 6 (*) >90 mL/min    GFR calc Af Amer 7 (*) >90 mL/min   TROPONIN I     Status: Normal   Collection Time   06/25/12 11:29 PM      Component Value Range Comment   Troponin I <0.30  <0.30 ng/mL   LACTIC ACID, PLASMA     Status: Normal   Collection Time   06/25/12 11:29 PM       Component Value Range Comment   Lactic Acid, Venous 1.4  0.5 - 2.2 mmol/L   MRSA PCR SCREENING     Status: Normal   Collection Time   06/26/12  3:20 AM      Component Value Range Comment   MRSA by PCR NEGATIVE  NEGATIVE   CBC     Status: Abnormal   Collection Time   06/26/12  6:02 AM      Component Value Range Comment   WBC 9.5  4.0 - 10.5 K/uL    RBC 3.49 (*) 3.87 - 5.11 MIL/uL    Hemoglobin 9.6 (*) 12.0 - 15.0 g/dL    HCT 84.1 (*) 32.4 - 46.0 %    MCV 89.1  78.0 - 100.0 fL    MCH 27.5  26.0 - 34.0 pg    MCHC 30.9  30.0 - 36.0 g/dL    RDW 40.1 (*) 02.7 - 15.5 %    Platelets 219  150 - 400 K/uL   BASIC METABOLIC PANEL     Status: Abnormal   Collection Time   06/26/12  6:02 AM      Component Value Range Comment   Sodium 132 (*) 135 - 145 mEq/L    Potassium 6.0 (*) 3.5 - 5.1 mEq/L    Chloride 96  96 - 112 mEq/L    CO2 21  19 - 32 mEq/L    Glucose, Bld 86  70 - 99 mg/dL    BUN 67 (*) 6 - 23 mg/dL    Creatinine, Ser 2.53 (*) 0.50 - 1.10 mg/dL    Calcium 66.4 (*) 8.4 - 10.5 mg/dL    GFR calc non Af Amer 6 (*) >90 mL/min    GFR calc Af Amer 7 (*) >90 mL/min   TROPONIN I     Status: Normal   Collection Time   06/26/12  6:02 AM      Component Value Range Comment   Troponin I <0.30  <0.30 ng/mL    Constitutional: negative for chills, fatigue, fevers and  sweats Ears, nose, mouth, throat, and face: negative for hearing loss, hoarseness, nasal congestion and sore throat Respiratory: negative for cough, dyspnea on exertion, hemoptysis and sputum Cardiovascular: negative for chest pain, chest pressure/discomfort, orthopnea and palpitations Gastrointestinal: positive for abdominal pain, negative for change in bowel habits, nausea, reflux symptoms and vomiting Genitourinary:negative for dysuria, frequency and hematuria, non-oliguric Musculoskeletal:negative for arthralgias, back pain, myalgias and neck pain Neurological: positive for dizziness and lower extremity weakness, negative for  headaches, paresthesia and speech problems  Physical Exam: Filed Vitals:   06/26/12 0850  BP: 115/83  Pulse: 84  Temp: 98.7 F (37.1 C)  Resp: 18     General appearance: alert, cooperative and no distress Head: Normocephalic, without obvious abnormality, atraumatic Neck: no adenopathy, no carotid bruit, no JVD and supple, symmetrical, trachea midline Resp: clear to auscultation bilaterally Cardio: RRR with Gr II/VI systolic murmur GI: + BS, slightly distended, soft with abdominal tenderness, especially in upper abdomen Extremities: 2+ edema bilaterally, atraumatic, no cyanosis Neurologic: Grossly normal Dialysis Access: Right IJ catheter   Assessment/Plan: 1. Abdominal pain - Hx fatty liver, cirrhosis with esophageal varices, and chronic pancreatitis, but current labs unremarkable; EGD in 09/2011 showed mild gastritis; cholecystectomy in 2003. 2. ESRD -  HD on TTS @ [facility]; last HD on 1/7, K 6 today.  HD today with 1K bath. 3. Dialysis access - Using right IJ catheter; L arm AVG (placed by Dr. Lyda Perone @ [facility] 10/25) ligated 1/6, after significant L arm swelling secondary to L SCV stenosis and pacemaker; new access to be placed at right arm on 1/29 by Dr. Hollace Hayward. 4. Hypertension/volume  - BP 102/72 most recently, on Coreg 6,25 mg bid; EDW unknown, but chest x-ray with pulmonary edema and chronic lower extremity edema, secondary to low EF and missed HD.  UF goal of 3 L as tolerated. 5. Anemia  - Hgb 9.6, but poor compliance with HD, and therefore Epogen.  Aranesp 100 mcg today. 6. Metabolic bone disease -  Ca 10.7 (12.3 corrected), P 3.7, iPTH 101 on 9/11; no Hectorol. 7. Nutrition - Alb 2, high protein renal diet. 8. Hx CAD/ischemic cardiomyopathy - NSTEMI in 04/2012; echo with EF 10%.  LYLES,CHARLES 06/26/2012, 8:55 AM   Attending Nephrologist: Annie Sable, MD    Patient seen and examined, agree with above note with above modifications.  Annie Sable,  MD 07/04/2012

## 2012-06-26 NOTE — Progress Notes (Signed)
[facility] PROGRESS NOTE  [name]:[id number] DOB: April 23, 1951 DOA: 06/25/2012 PCP: Jyl Heinz, MD  Assessment/Plan: 1-Abdominal pain: Patient with history of complex infected pseudoocyst with pancreatic necrosis last admission 05-22-2012. She was treated with IV antibiotics ceftazidime, and Vancomycin until 06/07/12. Suppose to have CT scan 06-10-2012. She has history of chronic pancreatitis. She presents with worsening abdominal pain. I will repeat CT scan. Patient will consider have scan. Will add IV dilaudid for pain PRN. No fevers, no leukocytosis.  2-ESRD: Continue with Dialysis. Correction of electrolytes during dialysis. No complaint with dialysis, missed last 2 session.  3-Ischemic cardiomyopathy status Med[employer] pacemaker/AICD with EF 10% in 04/2012: Continue with Zocor, IMDUR, Coreg, aspirin.  4-Hyperkalemia: Correction with dialysis.  5-Weakness: PT, OT . Neuro exam non focal.    Code Status: full. Family Communication: Care discussed with patient.  Disposition Plan: To be determine.    Consultants:  Nephrologist.   Procedures:  None.  Antibiotics:  Bactrim   HPI/Subjective: Patient still complaining of abdominal pain. Feels this is different pain. She will think about having CT scan.   Objective: Filed Vitals:   06/26/12 1300 06/26/12 1330 06/26/12 1340 06/26/12 1345  BP: 110/75 83/60 112/59 95/51  Pulse: 94 86 70 80  Temp:    97.6 F (36.4 C)  TempSrc:    Oral  Resp:    20  Height:      Weight:    52.9 kg (116 lb 10 oz)  SpO2:    96%    Intake/Output Summary (Last 24 hours) at 06/26/12 1431 Last data filed at 06/26/12 1345  Gross per 24 hour  Intake    550 ml  Output   1929 ml  Net  -1379 ml   Filed Weights   06/26/12 0039 06/26/12 0850 06/26/12 1345  Weight: 55.339 kg (122 lb) 55.6 kg (122 lb 9.2 oz) 52.9 kg (116 lb 10 oz)    Exam:   General:  No distress.  Cardiovascular: S 1, S 2, RRR.   Respiratory:  CTA.  Abdomen: BS present, soft, NR, NG. Tenderness palpation.   Data Reviewed: Basic Metabolic Panel:  Lab 06/26/12 9562 06/25/12 2328 06/25/12 1726  NA 132* -- 130*  K 6.0* -- 5.4*  CL 96 -- 92*  CO2 21 -- 22  GLUCOSE 86 -- 64*  BUN 67* -- 60*  CREATININE 6.62* 6.37* 6.17*  CALCIUM 10.7* -- 11.2*  MG -- -- --  PHOS -- -- --   Liver Function Tests:  Lab 06/25/12 1726  AST 14  ALT 5  ALKPHOS 118*  BILITOT 0.3  PROT 7.3  ALBUMIN 2.0*    Lab 06/25/12 1846  LIPASE 8*  AMYLASE --   No results found for this basename: AMMONIA:5 in the last 168 hours CBC:  Lab 06/26/12 0602 06/25/12 2328 06/25/12 1726  WBC 9.5 10.3 9.7  NEUTROABS -- -- 7.2  HGB 9.6* 10.2* 9.5*  HCT 31.1* 33.2* 30.7*  MCV 89.1 88.5 89.0  PLT 219 232 205   Cardiac Enzymes:  Lab 06/26/12 0602 06/25/12 2329  CKTOTAL -- --  CKMB -- --  CKMBINDEX -- --  TROPONINI <0.30 <0.30   BNP (last 3 results)  Basename 06/02/12 1944 05/08/12 0515 04/21/12 1419  PROBNP >70000.0* >70000.0* >70000.0*   CBG:  Lab 06/25/12 2315  GLUCAP 81    Recent Results (from the past 240 hour(s))  MRSA PCR SCREENING     Status: Normal   Collection Time   06/26/12  3:20 AM      Component Value Range Status Comment   MRSA by PCR NEGATIVE  NEGATIVE Final      Studies: Dg Chest 2 View  06/25/2012  *RADIOLOGY REPORT*  Clinical Data: Abdominal pain  CHEST - 2 VIEW  Comparison: 06/02/2012  Findings: There is a right chest wall dialysis catheter with tip in the right atrium.  Left chest wall AICD is noted with leads in the right atrial appendage, coronary sinus and right ventricle. Previous median sternotomy and CABG procedure.  There is marked cardiac enlargement. The small pleural effusions and mild interstitial edema noted.  IMPRESSION:  1.  Marked cardiac enlargement. 2.  Small pleural effusions and mild interstitial edema suggestive of CHF.   Original Report Authenticated By: Signa Kell, M.D.     Scheduled Meds:    . aspirin EC  81 mg Oral Daily  . carvedilol  6.25 mg Oral BID WC  . feeding supplement (NEPRO CARB STEADY)  237 mL Oral BID WC  . heparin  5,000 Units Subcutaneous Q8H  . hydrALAZINE  25 mg Oral Q8H  . HYDROmorphone  4 mg Oral Q4H  . isosorbide mononitrate  30 mg Oral Daily  . magnesium oxide  400 mg Oral Daily  . methadone  5 mg Oral TID  . multivitamin  1 tablet Oral QHS  . pantoprazole  40 mg Oral Daily  . ranolazine  500 mg Oral Daily  . sevelamer  400 mg Oral Q breakfast  . simvastatin  10 mg Oral q1800  . sodium chloride  3 mL Intravenous Q12H  . sodium chloride  3 mL Intravenous Q12H  . sulfamethoxazole-trimethoprim  2 tablet Oral QHS   Continuous Infusions:   Active Problems:  Pancreatitis, chronic  ESRD on hemodialysis    Time spent: 35 minutes.     Adine Heimann  [facility] Pager [phone]. If 8PM-8AM, please contact night-coverage at [email or web address], password [email or web address] 06/26/2012, 2:31 PM  LOS: 1 day

## 2012-06-27 ENCOUNTER — Encounter (HOSPITAL_COMMUNITY): Payer: Self-pay | Admitting: Nephrology

## 2012-06-27 DIAGNOSIS — N189 Chronic kidney disease, unspecified: Secondary | ICD-10-CM

## 2012-06-27 DIAGNOSIS — N039 Chronic nephritic syndrome with unspecified morphologic changes: Secondary | ICD-10-CM

## 2012-06-27 DIAGNOSIS — K859 Acute pancreatitis without necrosis or infection, unspecified: Secondary | ICD-10-CM

## 2012-06-27 LAB — URINALYSIS, ROUTINE W REFLEX MICROSCOPIC
Ketones, ur: NEGATIVE mg/dL
Specific Gravity, Urine: 1.018 (ref 1.005–1.030)
Urobilinogen, UA: 0.2 mg/dL (ref 0.0–1.0)
pH: 8 (ref 5.0–8.0)

## 2012-06-27 LAB — URINE MICROSCOPIC-ADD ON

## 2012-06-27 MED ORDER — SODIUM CHLORIDE 0.9 % IV SOLN
25.0000 mg | Freq: Once | INTRAVENOUS | Status: AC
  Administered 2012-06-27: 25 mg via INTRAVENOUS
  Filled 2012-06-27: qty 0.5

## 2012-06-27 MED ORDER — DARBEPOETIN ALFA-POLYSORBATE 100 MCG/0.5ML IJ SOLN
100.0000 ug | INTRAMUSCULAR | Status: DC
  Administered 2012-07-02: 100 ug via INTRAVENOUS
  Filled 2012-06-27: qty 0.5

## 2012-06-27 MED ORDER — CARVEDILOL 3.125 MG PO TABS
3.1250 mg | ORAL_TABLET | Freq: Two times a day (BID) | ORAL | Status: DC
Start: 2012-06-27 — End: 2012-07-07
  Administered 2012-06-28 – 2012-07-06 (×16): 3.125 mg via ORAL
  Filled 2012-06-27 (×22): qty 1

## 2012-06-27 MED ORDER — HYDROMORPHONE HCL PF 1 MG/ML IJ SOLN
1.0000 mg | INTRAMUSCULAR | Status: DC | PRN
  Administered 2012-06-27 (×2): 1 mg via INTRAVENOUS
  Administered 2012-06-27 – 2012-07-02 (×27): 2 mg via INTRAVENOUS
  Administered 2012-07-02: 1 mg via INTRAVENOUS
  Administered 2012-07-02 – 2012-07-03 (×5): 2 mg via INTRAVENOUS
  Administered 2012-07-03: 1 mg via INTRAVENOUS
  Administered 2012-07-03: 2 mg via INTRAVENOUS
  Administered 2012-07-04: 1 mg via INTRAVENOUS
  Administered 2012-07-04 – 2012-07-05 (×11): 2 mg via INTRAVENOUS
  Administered 2012-07-05: 1 mg via INTRAVENOUS
  Administered 2012-07-05 – 2012-07-06 (×5): 2 mg via INTRAVENOUS
  Administered 2012-07-06: 1 mg via INTRAVENOUS
  Administered 2012-07-06 – 2012-07-07 (×3): 2 mg via INTRAVENOUS
  Filled 2012-06-27 (×26): qty 2
  Filled 2012-06-27: qty 1
  Filled 2012-06-27 (×27): qty 2
  Filled 2012-06-27: qty 1
  Filled 2012-06-27: qty 2

## 2012-06-27 MED ORDER — SODIUM CHLORIDE 0.9 % IV SOLN
100.0000 mg | INTRAVENOUS | Status: DC
  Administered 2012-06-28 – 2012-07-07 (×5): 100 mg via INTRAVENOUS
  Filled 2012-06-27 (×9): qty 2

## 2012-06-27 MED ORDER — ONDANSETRON HCL 4 MG/2ML IJ SOLN
4.0000 mg | Freq: Four times a day (QID) | INTRAMUSCULAR | Status: DC | PRN
  Administered 2012-06-27 – 2012-07-05 (×3): 4 mg via INTRAVENOUS
  Filled 2012-06-27 (×3): qty 2

## 2012-06-27 NOTE — Progress Notes (Signed)
INITIAL NUTRITION ASSESSMENT  DOCUMENTATION CODES Per approved criteria  -Severe malnutrition in the context of chronic illness   INTERVENTION: 1. Continue Nepro po BID 2. Once renal labs improve, consider diet liberalization to promote weight gain and po variety 3. RD to continue to follow nutrition care plan  NUTRITION DIAGNOSIS: Inadequate oral intake related to abdominal pain 2/2 chronic pancreatitis as evidenced by ongoing weight loss.   Goal: Consume 50% of meals and supplements  Monitor:  Weights, labs, PO intake, I/O's  Reason for Assessment: Malnutrition Screening Tool  62 y.o. female  Admitting Dx: chronic pancreatitis + ESRD  ASSESSMENT: Admitted with abdominal pain. Work-up reveals chronic pancreatitis, infected pseudocyst and pancreatic necrosis and missed two HD sessions. Noted hx of FTT.  Pt with hx of frequent hospitalizations. Weight continues to decline. Pt reports that her intake has been relatively adequate - able to take 2-3 Nepro Shakes daily and also is able to eat in addition to that. Pt reports her weight has been fairly stable, however per EPIC weights, pt with 12% wt loss x 3 months, this is significant.  Nutrition Focused Physical Exam: Subcutaneous Fat:  Orbital Region: nourished Upper Arm Region: severe Thoracic and Lumbar Region: n/a  Muscle:  Temple Region: severe Clavicle Bone Region: severe Clavicle and Acromion Bone Region: moderate Scapular Bone Region: n/a Dorsal Hand: n/a Patellar Region: moderate Anterior Thigh Region: moderate Posterior Calf Region: moderate  Edema: moderate  Pt meets criteria for severe MALNUTRITION in the context of chronic illness as evidenced by wt change of 12% x 3 months and severe muscle mass and fat mass loss. Also noted to have moderate edema in lower extremities.  Height: Ht Readings from Last 1 Encounters:  06/26/12 5\' 4"  (1.626 m)    Weight: Wt Readings from Last 1 Encounters:  06/26/12 116  lb 2.9 oz (52.7 kg)    Ideal Body Weight: 120 lb/54.5 kg  % Ideal Body Weight: 97%  Wt Readings from Last 20 Encounters:  06/26/12 116 lb 2.9 oz (52.7 kg)  06/03/12 124 lb 1.9 oz (56.3 kg)  05/27/12 128 lb (58.06 kg)  05/11/12 119 lb 4.3 oz (54.1 kg)  05/11/12 119 lb 4.3 oz (54.1 kg)  04/23/12 119 lb 14.9 oz (54.4 kg)  04/04/12 117 lb 15.1 oz (53.5 kg)  03/05/12 115 lb 8.3 oz (52.4 kg)  02/24/12 130 lb 4.7 oz (59.1 kg)  10/13/11 135 lb 6.4 oz (61.417 kg)  09/17/11 132 lb 7.9 oz (60.1 kg)  09/17/11 132 lb 7.9 oz (60.1 kg)  06/04/11 157 lb 13.6 oz (71.6 kg)  04/08/11 159 lb 6.4 oz (72.303 kg)  12/01/10 176 lb (79.833 kg)   Usual Body Weight: 130 - 135 lb  % Usual Body Weight: 88%; 12% wt loss x 3 months  BMI:  Body mass index is 19.94 kg/(m^2). Weight is WNL  Estimated Nutritional Needs: Kcal: 1600 - 1800 kcal Protein: 80 - 90 grams Fluid: 1.2 liters daily  Skin: intact  Diet Order: Renal 80-90; 1200 ml fluid restriction  EDUCATION NEEDS: -No education needs identified at this time   Intake/Output Summary (Last 24 hours) at 06/27/12 1456 Last data filed at 06/27/12 1318  Gross per 24 hour  Intake    490 ml  Output      0 ml  Net    490 ml    Last BM: 1/11  Labs:   Lab 06/26/12 0602 06/25/12 2328 06/25/12 1726  NA 132* -- 130*  K 6.0* --  5.4*  CL 96 -- 92*  CO2 21 -- 22  BUN 67* -- 60*  CREATININE 6.62* 6.37* 6.17*  CALCIUM 10.7* -- 11.2*  MG -- -- --  PHOS -- -- --  GLUCOSE 86 -- 64*    CBG (last 3)   Basename 06/25/12 2315  GLUCAP 81    Scheduled Meds:   . aspirin EC  81 mg Oral Daily  . carvedilol  3.125 mg Oral BID WC  . darbepoetin  100 mcg Intravenous Q Sat-HD  . feeding supplement (NEPRO CARB STEADY)  237 mL Oral BID WC  . heparin  5,000 Units Subcutaneous Q8H  . hydrALAZINE  25 mg Oral Q8H  . HYDROmorphone  4 mg Oral Q4H  . iron dextran (INFED/DEXFERRUM) infusion  100 mg Intravenous Q T,Th,Sa-HD  . isosorbide mononitrate  30 mg  Oral Daily  . magnesium oxide  400 mg Oral Daily  . methadone  5 mg Oral TID  . multivitamin  1 tablet Oral QHS  . pantoprazole  40 mg Oral Daily  . ranolazine  500 mg Oral Daily  . sevelamer  400 mg Oral Q breakfast  . simvastatin  10 mg Oral q1800  . sodium chloride  3 mL Intravenous Q12H  . sodium chloride  3 mL Intravenous Q12H  . sulfamethoxazole-trimethoprim  2 tablet Oral QHS    Continuous Infusions:   Past Medical History  Diagnosis Date  . Gallstone pancreatitis     Underwent cholecystectomy (and resection of pancreas?) in 2000.  Then has had hx of chronic recurrrent pancreatitis with hx of pseudocyst formation, suspected rupture of pseudocyst w polymicrobial sepsis (April 2013, see below), hx of necrotizing pancreatitis and hx of hemorrhagic pseudocyst ([facility], [place])  . Coronary artery disease     s/p CABG 2008 with multiple PCIs  . Ischemic cardiomyopathy     Cath 04/2012, significant calcifications  . History of nonadherence to medical treatment   . Systolic CHF     04/9146 echo EF 10%  . Esophageal varices   . Cirrhosis     hx of fatty liver per patient, has known ascites with repeated paracenteses at [facility] as of 2013, also +hx of esoph varices with banding in the past  . Polymicrobial sepsis     April 2013 presented septic with blood cx's + for candida glabrata, MRSA and enterbacter cloacae, felt to be due to ruptured pancreatic pseudocyst  . Pacemaker   . ICD (implantable cardiac defibrillator) in place   . Hypertension     "used to have HTN; now I'm low" (05/23/2012)  . NSTEMI (non-ST elevated myocardial infarction)     04/2012; Trop peaked to 1.39; 05/23/2012 pt denies ever having MI  . Pneumonia 02/2012    "first time ever" (05/23/2012)  . Chronic bronchitis 1970's thru 2002    "went away when I stopped smoking in 2002" (05/23/2012)  . Diabetes mellitus     Diet controlled  . History of blood transfusion     "alot of them" (05/23/2012)  . End stage renal  disease     T,Th,Sat HD by [facility] on [facility] (05/23/2012); h/o peritoneal dialysis prior; uses right chest cath for HD, left arm graft placed by [facility] but not ready for use  . Iron (Fe) deficiency anemia     "severe" (05/23/2012)    Past Surgical History  Procedure Date  . Esophagogastroduodenoscopy 09/15/2011    Procedure: ESOPHAGOGASTRODUODENOSCOPY (EGD);  Surgeon: Theda Belfast, MD;  Location:  [facility];  Service: Endoscopy;  Laterality: N/A;  . Cholecystectomy 2003  . Tonsillectomy and adenoidectomy 1961  . Appendectomy 1973?  [name] Vaginal hysterectomy 1973?  [name] Tubal ligation 1972  . Dilation and curettage of uterus     "bunch from profuse bleeding in the 1970's" (05/23/2012)  . Coronary artery bypass graft 2008    CABG X4  . Coronary angioplasty with stent placement 2008    "1; day after CABG" (05/23/2012)  . Cardiac catheterization     "before 2008 and 3 wk ago" (05/23/2012)  . Insert / replace / remove pacemaker 06/2011    pacemaker ICD  . Cardiac defibrillator placement 06/2011  . Arteriovenous graft placement 03/2012    right antecub  . Insertion of dialysis catheter ~ 10/2011    right chest  . Peritoneal catheter insertion 08/2011  . Peritoneal catheter removal ~ 10/2011    Jarold Motto MS, RD, LDN Pager: [phone] After-hours pager: [phone]

## 2012-06-27 NOTE — Consult Note (Signed)
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 Contact IR for eval  Comment- appropriate antibiotics for her are somewhat limited by her history of resistant organisms. Cefepime, flagyl, zyvox, mycafungin would-be choices depending on the history of organisms which she has run from her previous fluid. Given that she is currently stable (afebrile, normal blood pressure, normal white blood cell count) would favor holding her antibiotics until we have a fluid sample for culture.  Thank you so much for this interesting consult,   Johny Sax [phone]  [name] is an 62 y.o. female.  HPI: A history of end-stage renal disease as well as cirrhosis of the liver. She has undergone a previous pancreatectomy. She has been on peritoneal dialysis until April 2013 ,at the time she was admitted to [facility] and found to have bacteremia with MRSA as well as a KPC producing Enterobacter. She was also fungemic with Candida parapsilosis. She had a perforated viscus noted on her abdominal imaging at that time however this was managed medically. She was readmitted to [facility] 10-2011 after this with peritonitis with VRE as well as Candida parapsilosis. 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 She was being managed with serial paracentesis after that time. She returned to [facility] system on December 8 with fever and worsening abdominal pain. She had not had a paracentesis for at least 2 months prior to this. She  underwent paracentesis at this time her culture was negative and she was managed with vancomycin and ceftazidime at hemodialysis as well as oral Flagyl. She was discharged home on December 8. Return to the hospital December 19 with an episode of chest pain after running out of her nitroglycerin. She had negative troponins x2 and was discharged home on December 20th. She returns to [facility] on 06/26/2011 with worsening abdominal pain and feeling poorly. She was afebrile and her white blood cell count was normal. She underwent CT scan on 1-12: "Changes consistent with necrotizing pancreatitis and peripancreatic abscesses with gas. Ascites. Abnormal hepatic parenchymal perfusion with diffuse portal venous thrombosis. Changes have progressed since previous study." She has not been felt to be a good surgical candidate due to her cardiomyopathy (ejection fraction 10%, November 2013).   Past Medical History  Diagnosis Date  . Gallstone pancreatitis     Underwent cholecystectomy (and resection of pancreas?) in 2000.  Then has had hx of chronic recurrrent pancreatitis with hx of pseudocyst formation, suspected rupture of pseudocyst w polymicrobial sepsis (April 2013, see below), hx of necrotizing pancreatitis and hx of hemorrhagic pseudocyst ([facility], [place])  . Coronary artery disease     s/p CABG 2008 with multiple PCIs  . Ischemic cardiomyopathy     Cath 04/2012, significant calcifications  . History of nonadherence to medical treatment   . Systolic CHF     65/7846 echo EF 10%  . Esophageal varices   . Cirrhosis     hx of fatty liver per patient, has known ascites with repeated paracenteses at [facility] as of 2013, also +hx of esoph varices with banding in the past  . Polymicrobial sepsis  April 2013 presented septic with blood cx's + for candida glabrata, MRSA and enterbacter cloacae, felt to be due to ruptured pancreatic pseudocyst  . Pacemaker   . ICD (implantable cardiac defibrillator) in  place   . Hypertension     "used to have HTN; now I'm low" (05/23/2012)  . NSTEMI (non-ST elevated myocardial infarction)     04/2012; Trop peaked to 1.39; 05/23/2012 pt denies ever having MI  . Pneumonia 02/2012    "first time ever" (05/23/2012)  . Chronic bronchitis 1970's thru 2002    "went away when I stopped smoking in 2002" (05/23/2012)  . Diabetes mellitus     Diet controlled  . History of blood transfusion     "alot of them" (05/23/2012)  . End stage renal disease     T,Th,Sat HD by [facility] (05/23/2012); h/o peritoneal dialysis prior; uses right chest cath for HD, left arm graft placed by [facility] but not ready for use  . Iron (Fe) deficiency anemia     "severe" (05/23/2012)    Past Surgical History  Procedure Date  . Esophagogastroduodenoscopy 09/15/2011    Procedure: ESOPHAGOGASTRODUODENOSCOPY (EGD);  Surgeon: Theda Belfast, MD;  Location: [facility] ENDOSCOPY;  Service: Endoscopy;  Laterality: N/A;  . Cholecystectomy 2003  . Tonsillectomy and adenoidectomy 1961  . Appendectomy 1973?  [name] Vaginal hysterectomy 1973?  [name] Tubal ligation 1972  . Dilation and curettage of uterus     "bunch from profuse bleeding in the 1970's" (05/23/2012)  . Coronary artery bypass graft 2008    CABG X4  . Coronary angioplasty with stent placement 2008    "1; day after CABG" (05/23/2012)  . Cardiac catheterization     "before 2008 and 3 wk ago" (05/23/2012)  . Insert / replace / remove pacemaker 06/2011    pacemaker ICD  . Cardiac defibrillator placement 06/2011  . Arteriovenous graft placement 03/2012    right antecub  . Insertion of dialysis catheter ~ 10/2011    right chest  . Peritoneal catheter insertion 08/2011  . Peritoneal catheter removal ~ 10/2011     Allergies  Allergen Reactions  . Codeine Itching  . Morphine And Related Shortness Of Breath    SOB when given IV once "to me it was mild; I called out fast for help; never had to intubate" (05/23/2012)  . Ace Inhibitors  Other (See Comments)    Unknown reaction but her physician stated that she can't take it (specifically lisinopril)  . Tylenol (Acetaminophen) Other (See Comments)    Patient states that the doctor told her she has a Fatty Liver.    Medications:  Scheduled:   . aspirin EC  81 mg Oral Daily  . carvedilol  3.125 mg Oral BID WC  . darbepoetin  100 mcg Intravenous Q Sat-HD  . feeding supplement (NEPRO CARB STEADY)  237 mL Oral BID WC  . heparin  5,000 Units Subcutaneous Q8H  . hydrALAZINE  25 mg Oral Q8H  . HYDROmorphone  4 mg Oral Q4H  . iron dextran (INFED/DEXFERRUM) infusion  100 mg Intravenous Q T,Th,Sa-HD  . isosorbide mononitrate  30 mg Oral Daily  . magnesium oxide  400 mg Oral Daily  . methadone  5 mg Oral TID  . multivitamin  1 tablet Oral QHS  . pantoprazole  40 mg Oral Daily  . ranolazine  500 mg Oral Daily  . sevelamer  400 mg Oral Q breakfast  . simvastatin  10 mg Oral q1800  .  sodium chloride  3 mL Intravenous Q12H  . sodium chloride  3 mL Intravenous Q12H  . sulfamethoxazole-trimethoprim  2 tablet Oral QHS    Total days of antibiotics Day 2 (bactrim)          Social History:  reports that she quit smoking about 12 years ago. Her smoking use included Cigarettes. She has a 10.23 pack-year smoking history. She has never used smokeless tobacco. She reports that she drinks alcohol. She reports that she does not use illicit drugs.  Family History  Problem Relation Age of Onset  . Coronary artery disease Mother   . Hypertension Mother   . Diabetes Mother   . Diabetes Sister   . Anesthesia problems Neg Hx     General ROS: No fever or chills. Last paracentesis was at least 3 weeks ago. Normal bowel movements at home. Constipated since she's been in the hospital. No problems with her hemodialysis access site. No headaches. No vision change. No change in her lower extremity edema. See history of present illness.  Blood pressure 115/70, pulse 73, temperature 97.9 F (36.6  C), temperature source Oral, resp. rate 18, height 5\' 4"  (1.626 m), weight 52.7 kg (116 lb 2.9 oz), SpO2 98.00%. General appearance: alert, cooperative, cachectic and no distress Eyes: negative findings: pupils equal, round, reactive to light and accomodation Throat: normal findings: oropharynx pink & moist without lesions or evidence of thrush Neck: no adenopathy and supple, symmetrical, trachea midline Lungs: clear to auscultation bilaterally and She has a hemodialysis catheter in the area of her right subclavian. in the area of her left upper chest she has an AICD. This is nonfluctuant nontender. Heart: regular rate and rhythm Abdomen: normal findings: bowel sounds normal and abnormal findings:  distended, guarding and moderate tenderness in the RLQ Extremities: edema 3 plus ankle and She is a nonfunctioning AV graft in the left upper extremity. It appears to be thrombosed, it is firm. There is no bruit.   Results for orders placed during the hospital encounter of 06/25/12 (from the past 48 hour(s))  COMPREHENSIVE METABOLIC PANEL     Status: Abnormal   Collection Time   06/25/12  5:26 PM      Component Value Range Comment   Sodium 130 (*) 135 - 145 mEq/L    Potassium 5.4 (*) 3.5 - 5.1 mEq/L    Chloride 92 (*) 96 - 112 mEq/L    CO2 22  19 - 32 mEq/L    Glucose, Bld 64 (*) 70 - 99 mg/dL    BUN 60 (*) 6 - 23 mg/dL    Creatinine, Ser 0.98 (*) 0.50 - 1.10 mg/dL    Calcium 11.9 (*) 8.4 - 10.5 mg/dL    Total Protein 7.3  6.0 - 8.3 g/dL    Albumin 2.0 (*) 3.5 - 5.2 g/dL    AST 14  0 - 37 U/L    ALT 5  0 - 35 U/L    Alkaline Phosphatase 118 (*) 39 - 117 U/L    Total Bilirubin 0.3  0.3 - 1.2 mg/dL    GFR calc non Af Amer 7 (*) >90 mL/min    GFR calc Af Amer 8 (*) >90 mL/min   CBC WITH DIFFERENTIAL     Status: Abnormal   Collection Time   06/25/12  5:26 PM      Component Value Range Comment   WBC 9.7  4.0 - 10.5 K/uL    RBC 3.45 (*) 3.87 - 5.11 MIL/uL  Hemoglobin 9.5 (*) 12.0 - 15.0  g/dL    HCT 16.1 (*) 09.6 - 46.0 %    MCV 89.0  78.0 - 100.0 fL    MCH 27.5  26.0 - 34.0 pg    MCHC 30.9  30.0 - 36.0 g/dL    RDW 04.5 (*) 40.9 - 15.5 %    Platelets 205  150 - 400 K/uL    Neutrophils Relative 74  43 - 77 %    Neutro Abs 7.2  1.7 - 7.7 K/uL    Lymphocytes Relative 16  12 - 46 %    Lymphs Abs 1.6  0.7 - 4.0 K/uL    Monocytes Relative 8  3 - 12 %    Monocytes Absolute 0.8  0.1 - 1.0 K/uL    Eosinophils Relative 1  0 - 5 %    Eosinophils Absolute 0.1  0.0 - 0.7 K/uL    Basophils Relative 0  0 - 1 %    Basophils Absolute 0.0  0.0 - 0.1 K/uL   LIPASE, BLOOD     Status: Abnormal   Collection Time   06/25/12  6:46 PM      Component Value Range Comment   Lipase 8 (*) 11 - 59 U/L   GLUCOSE, CAPILLARY     Status: Normal   Collection Time   06/25/12 11:15 PM      Component Value Range Comment   Glucose-Capillary 81  70 - 99 mg/dL   CBC     Status: Abnormal   Collection Time   06/25/12 11:28 PM      Component Value Range Comment   WBC 10.3  4.0 - 10.5 K/uL    RBC 3.75 (*) 3.87 - 5.11 MIL/uL    Hemoglobin 10.2 (*) 12.0 - 15.0 g/dL    HCT 81.1 (*) 91.4 - 46.0 %    MCV 88.5  78.0 - 100.0 fL    MCH 27.2  26.0 - 34.0 pg    MCHC 30.7  30.0 - 36.0 g/dL    RDW 78.2 (*) 95.6 - 15.5 %    Platelets 232  150 - 400 K/uL   CREATININE, SERUM     Status: Abnormal   Collection Time   06/25/12 11:28 PM      Component Value Range Comment   Creatinine, Ser 6.37 (*) 0.50 - 1.10 mg/dL    GFR calc non Af Amer 6 (*) >90 mL/min    GFR calc Af Amer 7 (*) >90 mL/min   TROPONIN I     Status: Normal   Collection Time   06/25/12 11:29 PM      Component Value Range Comment   Troponin I <0.30  <0.30 ng/mL   LACTIC ACID, PLASMA     Status: Normal   Collection Time   06/25/12 11:29 PM      Component Value Range Comment   Lactic Acid, Venous 1.4  0.5 - 2.2 mmol/L   MRSA PCR SCREENING     Status: Normal   Collection Time   06/26/12  3:20 AM      Component Value Range Comment   MRSA by PCR  NEGATIVE  NEGATIVE   CBC     Status: Abnormal   Collection Time   06/26/12  6:02 AM      Component Value Range Comment   WBC 9.5  4.0 - 10.5 K/uL    RBC 3.49 (*) 3.87 - 5.11 MIL/uL    Hemoglobin 9.6 (*) 12.0 -  15.0 g/dL    HCT 14.7 (*) 82.9 - 46.0 %    MCV 89.1  78.0 - 100.0 fL    MCH 27.5  26.0 - 34.0 pg    MCHC 30.9  30.0 - 36.0 g/dL    RDW 56.2 (*) 13.0 - 15.5 %    Platelets 219  150 - 400 K/uL   BASIC METABOLIC PANEL     Status: Abnormal   Collection Time   06/26/12  6:02 AM      Component Value Range Comment   Sodium 132 (*) 135 - 145 mEq/L    Potassium 6.0 (*) 3.5 - 5.1 mEq/L    Chloride 96  96 - 112 mEq/L    CO2 21  19 - 32 mEq/L    Glucose, Bld 86  70 - 99 mg/dL    BUN 67 (*) 6 - 23 mg/dL    Creatinine, Ser 8.65 (*) 0.50 - 1.10 mg/dL    Calcium 78.4 (*) 8.4 - 10.5 mg/dL    GFR calc non Af Amer 6 (*) >90 mL/min    GFR calc Af Amer 7 (*) >90 mL/min   TROPONIN I     Status: Normal   Collection Time   06/26/12  6:02 AM      Component Value Range Comment   Troponin I <0.30  <0.30 ng/mL   HEPATITIS B SURFACE ANTIGEN     Status: Normal   Collection Time   06/26/12  9:55 AM      Component Value Range Comment   Hepatitis B Surface Ag NEGATIVE  NEGATIVE   TROPONIN I     Status: Normal   Collection Time   06/26/12  2:32 PM      Component Value Range Comment   Troponin I <0.30  <0.30 ng/mL   URINALYSIS, ROUTINE W REFLEX MICROSCOPIC     Status: Abnormal   Collection Time   06/27/12  6:36 AM      Component Value Range Comment   Color, Urine YELLOW  YELLOW    APPearance CLOUDY (*) CLEAR    Specific Gravity, Urine 1.018  1.005 - 1.030    pH 8.0  5.0 - 8.0    Glucose, UA NEGATIVE  NEGATIVE mg/dL    Hgb urine dipstick MODERATE (*) NEGATIVE    Bilirubin Urine SMALL (*) NEGATIVE    Ketones, ur NEGATIVE  NEGATIVE mg/dL    Protein, ur >696 (*) NEGATIVE mg/dL    Urobilinogen, UA 0.2  0.0 - 1.0 mg/dL    Nitrite NEGATIVE  NEGATIVE    Leukocytes, UA SMALL (*) NEGATIVE   URINE  MICROSCOPIC-ADD ON     Status: Abnormal   Collection Time   06/27/12  6:36 AM      Component Value Range Comment   Squamous Epithelial / LPF MANY (*) RARE    WBC, UA 7-10  <3 WBC/hpf    RBC / HPF 11-20  <3 RBC/hpf    Bacteria, UA RARE  RARE    Urine-Other LESS THAN 10 mL OF URINE SUBMITTED         Component Value Date/Time   SDES FLUID ASCITIC 05/25/2012 1302   SDES FLUID ASCITIC 05/25/2012 1302   SPECREQUEST SYRINGE @ 60CC 05/25/2012 1302   SPECREQUEST SYRINGE @ 60CC 05/25/2012 1302   CULT NO GROWTH 3 DAYS 05/25/2012 1302   CULT No Fungi Isolated in 4 Weeks 05/25/2012 1302   REPTSTATUS 05/29/2012 FINAL 05/25/2012 1302   REPTSTATUS 06/22/2012 FINAL 05/25/2012 1302  Dg Chest 2 View  06/25/2012  *RADIOLOGY REPORT*  Clinical Data: Abdominal pain  CHEST - 2 VIEW  Comparison: 06/02/2012  Findings: There is a right chest wall dialysis catheter with tip in the right atrium.  Left chest wall AICD is noted with leads in the right atrial appendage, coronary sinus and right ventricle. Previous median sternotomy and CABG procedure.  There is marked cardiac enlargement. The small pleural effusions and mild interstitial edema noted.  IMPRESSION:  1.  Marked cardiac enlargement. 2.  Small pleural effusions and mild interstitial edema suggestive of CHF.   Original Report Authenticated By: Signa Kell, M.D.    Ct Abdomen Pelvis W Contrast  06/26/2012  *RADIOLOGY REPORT*  Clinical Data: Worsening abdominal pain.  The patient has a history of complex infected pseudocyst with pancreatic necrosis.  Last admission 05/22/2012.  History of chronic pancreatitis.  CT ABDOMEN AND PELVIS WITH CONTRAST  Technique:  Multidetector CT imaging of the abdomen and pelvis was performed following the standard protocol during bolus administration of intravenous contrast.  Contrast: 80mL OMNIPAQUE IOHEXOL 300 MG/ML  SOLN  Comparison: 05/24/2012  Findings: Small right pleural effusion.  Atelectasis or infiltration in both lung  bases.  Diffuse cardiac enlargement. Postoperative changes in the mediastinum.  There is diffuse abdominal and pelvic fluid consistent with ascites, increasing since previous study.  Diffuse subcutaneous soft tissue edema.  The liver demonstrates diffuse central hypo perfusion pattern similar to previous study.  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  Spleen size is normal.  Gallbladder is surgically absent.  Visualization of the pancreas is limited due to obscuration by the surrounding fluid.  There appear to be gas collections throughout the pancreas or peripancreatic tissues with gas and fluid collection again demonstrated at the left upper quadrant extending from the region of the pancreatic tail into the splenic hilum.  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  Gas and fluid collections appear to be increasing mildly since the previous study. No discrete drainable collection is identified.  Visualization of bowel is limited due to limited opacification.  However, there is suggestion of mild wall thickening consistent with edema in the jejunum.  Contrast material does pass through the colon suggesting no evidence of obstruction. The stomach is decompressed.  The colon is stool-filled.  The kidneys demonstrate small parenchymal cysts.  There is no hydronephrosis.  There is no contrast material shown in the renal collecting systems on the delayed images, suggesting possible delayed appearance but delayed images were obtained only 2-3 minutes after arterial phase in this may be artifactual.  Delayed appearance of contrast material in the renal collecting system cannot suggest renal hypoperfusion or medical renal disease.  Ascites extends into the pelvis.  The bladder is decompressed. Pelvic organs are not visualized.  Scattered diverticula in the sigmoid colon without definite diverticulitis.  The appendix is normal.  Normal alignment of the lumbar vertebrae without compression  deformity or expansile destructive change.  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  Ascites.  Abnormal hepatic parenchymal perfusion with diffuse portal venous thrombosis.  Changes have progressed since previous study.  Probable edema in the jejunum which might be related to ascites.  Delayed appearance of contrast material in the renal collecting systems suggest renal parenchymal disease.   Original Report Authenticated By: Burman Nieves, M.D.    Recent Results (from the past 240 hour(s))  MRSA PCR SCREENING     Status: Normal   Collection Time   06/26/12  3:20 AM      Component Value Range Status Comment   MRSA by PCR NEGATIVE  NEGATIVE Final       06/27/2012, 4:12 PM     LOS: 2 days

## 2012-06-27 NOTE — Progress Notes (Signed)
[facility] PROGRESS NOTE  NUR RABOLD ZOX:[id number] DOB: 1950/08/04 DOA: 06/25/2012 PCP: Jyl Heinz, MD  Assessment/Plan:  1-Abdominal pain: Patient with history of complex infected pseudoocyst with pancreatic necrosis last admission 05-22-2012. She was treated with IV antibiotics ceftazidime, and Vancomycin until 06/07/12. Suppose to have CT scan 06-10-2012. She has history of chronic pancreatitis. She presents with worsening abdominal pain. 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 Ascites. Abnormal hepatic parenchymal  perfusion with diffuse portal venous thrombosis. Changes have progressed since previous study.  -Surgery consult. -ID consult.   2-ESRD: Continue with Dialysis. Correction of electrolytes during dialysis. No complaint with dialysis, missed last 2 session.  3-Ischemic cardiomyopathy status [employer] pacemaker/AICD with EF 10% in 04/2012: Continue with Zocor, IMDUR, Coreg, aspirin.  4-Hyperkalemia: Correction with dialysis.  5-Weakness: PT, OT . Neuro exam non focal.    Code Status: full. Family Communication: Care discussed with patient.  Disposition Plan: To be determine.    Consultants:  Nephrologist.   Procedures:  None.  Antibiotics:  Bactrim   HPI/Subjective: Patient still complaining of abdominal pain. Feels this is different pain. She will think about having CT scan.   Objective: Filed Vitals:   06/27/12 0534 06/27/12 0932 06/27/12 1318 06/27/12 1640  BP: 113/75 126/85 115/70 107/70  Pulse: 84 81 73 79  Temp: 98.4 F (36.9 C) 97.8 F (36.6 C) 97.9 F (36.6 C) 97.9 F (36.6 C)  TempSrc: Oral     Resp: 18 17 18 17   Height:      Weight:      SpO2: 95% 95% 98% 97%    Intake/Output Summary (Last 24 hours) at 06/27/12 1936 Last data filed at 06/27/12 1839  Gross per 24 hour  Intake    730 ml  Output      0 ml  Net    730 ml   Filed Weights   06/26/12 0850  06/26/12 1345 06/26/12 2021  Weight: 55.6 kg (122 lb 9.2 oz) 52.9 kg (116 lb 10 oz) 52.7 kg (116 lb 2.9 oz)    Exam:   General:  No distress.  Cardiovascular: S 1, S 2, RRR.   Respiratory: CTA.  Abdomen: BS present, soft, NR, NG. Tenderness palpation.   Data Reviewed: Basic Metabolic Panel:  Lab 06/26/12 8119 06/25/12 2328 06/25/12 1726  NA 132* -- 130*  K 6.0* -- 5.4*  CL 96 -- 92*  CO2 21 -- 22  GLUCOSE 86 -- 64*  BUN 67* -- 60*  CREATININE 6.62* 6.37* 6.17*  CALCIUM 10.7* -- 11.2*  MG -- -- --  PHOS -- -- --   Liver Function Tests:  Lab 06/25/12 1726  AST 14  ALT 5  ALKPHOS 118*  BILITOT 0.3  PROT 7.3  ALBUMIN 2.0*    Lab 06/25/12 1846  LIPASE 8*  AMYLASE --   No results found for this basename: AMMONIA:5 in the last 168 hours CBC:  Lab 06/26/12 0602 06/25/12 2328 06/25/12 1726  WBC 9.5 10.3 9.7  NEUTROABS -- -- 7.2  HGB 9.6* 10.2* 9.5*  HCT 31.1* 33.2* 30.7*  MCV 89.1 88.5 89.0  PLT 219 232 205   Cardiac Enzymes:  Lab 06/26/12 1432 06/26/12 0602 06/25/12 2329  CKTOTAL -- -- --  CKMB -- -- --  CKMBINDEX -- -- --  TROPONINI <0.30 <0.30 <0.30   BNP (last 3 results)  Basename 06/02/12 1944 05/08/12 0515 04/21/12 1419  PROBNP >70000.0* >70000.0* >70000.0*   CBG:  Lab  06/25/12 2315  GLUCAP 81    Recent Results (from the past 240 hour(s))  MRSA PCR SCREENING     Status: Normal   Collection Time   06/26/12  3:20 AM      Component Value Range Status Comment   MRSA by PCR NEGATIVE  NEGATIVE Final      Studies: Ct Abdomen Pelvis W Contrast  06/26/2012  *RADIOLOGY REPORT*  Clinical Data: Worsening abdominal pain.  The patient has a history of complex infected pseudocyst with pancreatic necrosis.  Last admission 05/22/2012.  History of chronic pancreatitis.  CT ABDOMEN AND PELVIS WITH CONTRAST  Technique:  Multidetector CT imaging of the abdomen and pelvis was performed following the standard protocol during bolus administration of  intravenous contrast.  Contrast: 80mL OMNIPAQUE IOHEXOL 300 MG/ML  SOLN  Comparison: 05/24/2012  Findings: Small right pleural effusion.  Atelectasis or infiltration in both lung bases.  Diffuse cardiac enlargement. Postoperative changes in the mediastinum.  There is diffuse abdominal and pelvic fluid consistent with ascites, increasing since previous study.  Diffuse subcutaneous soft tissue edema.  The liver demonstrates diffuse central hypo perfusion pattern similar to previous study.  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  Spleen size is normal.  Gallbladder is surgically absent.  Visualization of the pancreas is limited due to obscuration by the surrounding fluid.  There appear to be gas collections throughout the pancreas or peripancreatic tissues with gas and fluid collection again demonstrated at the left upper quadrant extending from the region of the pancreatic tail into the splenic hilum.  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  Gas and fluid collections appear to be increasing mildly since the previous study. No discrete drainable collection is identified.  Visualization of bowel is limited due to limited opacification.  However, there is suggestion of mild wall thickening consistent with edema in the jejunum.  Contrast material does pass through the colon suggesting no evidence of obstruction. The stomach is decompressed.  The colon is stool-filled.  The kidneys demonstrate small parenchymal cysts.  There is no hydronephrosis.  There is no contrast material shown in the renal collecting systems on the delayed images, suggesting possible delayed appearance but delayed images were obtained only 2-3 minutes after arterial phase in this may be artifactual.  Delayed appearance of contrast material in the renal collecting system cannot suggest renal hypoperfusion or medical renal disease.  Ascites extends into the pelvis.  The bladder is decompressed. Pelvic organs are  not visualized.  Scattered diverticula in the sigmoid colon without definite diverticulitis.  The appendix is normal.  Normal alignment of the lumbar vertebrae without compression deformity or expansile destructive change.  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  Ascites.  Abnormal hepatic parenchymal perfusion with diffuse portal venous thrombosis.  Changes have progressed since previous study.  Probable edema in the jejunum which might be related to ascites.  Delayed appearance of contrast material in the renal collecting systems suggest renal parenchymal disease.   Original Report Authenticated By: Burman Nieves, M.D.     Scheduled Meds:    . aspirin EC  81 mg Oral Daily  . carvedilol  3.125 mg Oral BID WC  . darbepoetin  100 mcg Intravenous Q Sat-HD  . feeding supplement (NEPRO CARB STEADY)  237 mL Oral BID WC  . heparin  5,000 Units Subcutaneous Q8H  . hydrALAZINE  25 mg Oral Q8H  . HYDROmorphone  4 mg Oral Q4H  . iron dextran (INFED/DEXFERRUM)  infusion  100 mg Intravenous Q T,Th,Sa-HD  . isosorbide mononitrate  30 mg Oral Daily  . magnesium oxide  400 mg Oral Daily  . methadone  5 mg Oral TID  . multivitamin  1 tablet Oral QHS  . pantoprazole  40 mg Oral Daily  . ranolazine  500 mg Oral Daily  . sevelamer  400 mg Oral Q breakfast  . simvastatin  10 mg Oral q1800  . sodium chloride  3 mL Intravenous Q12H  . sodium chloride  3 mL Intravenous Q12H  . sulfamethoxazole-trimethoprim  2 tablet Oral QHS   Continuous Infusions:   Active Problems:  Pancreatitis, chronic  ESRD on hemodialysis    Time spent: 35 minutes.     Shantil Vallejo  [facility] Pager [phone]. If 8PM-8AM, please contact night-coverage at [email or web address], password [facility] 06/27/2012, 7:36 PM  LOS: 2 days

## 2012-06-27 NOTE — Consult Note (Signed)
Chronic infection of pancreas has been present for some time. She is not a good candidate for surgery given her poor cardiac function and poor perfusion of her liver and she agrees with this. I would agree with abx and conservative management

## 2012-06-27 NOTE — Progress Notes (Signed)
Subjective:  Feeling better since hd yesterday/ some abd. Discomfort  continues Objective Vital signs in last 24 hours: Filed Vitals:   06/26/12 1800 06/26/12 2021 06/27/12 0534 06/27/12 0932  BP: 125/64 108/71 113/75 126/85  Pulse: 98 52 84 81  Temp: 98.6 F (37 C) 97.9 F (36.6 C) 98.4 F (36.9 C) 97.8 F (36.6 C)  TempSrc: Oral Oral Oral   Resp: 18 18 18 17   Height:      Weight:  52.7 kg (116 lb 2.9 oz)    SpO2: 95% 97% 95% 95%   Weight change: 0.261 kg (9.2 oz)  Intake/Output Summary (Last 24 hours) at 06/27/12 1110 Last data filed at 06/27/12 0924  Gross per 24 hour  Intake    460 ml  Output   1929 ml  Net  -1469 ml   Labs: Basic Metabolic Panel:  Lab 06/26/12 1610 06/25/12 2328 06/25/12 1726  NA 132* -- 130*  K 6.0* -- 5.4*  CL 96 -- 92*  CO2 21 -- 22  GLUCOSE 86 -- 64*  BUN 67* -- 60*  CREATININE 6.62* 6.37* 6.17*  CALCIUM 10.7* -- 11.2*  ALB -- -- --  PHOS -- -- --   Liver Function Tests:  Lab 06/25/12 1726  AST 14  ALT 5  ALKPHOS 118*  BILITOT 0.3  PROT 7.3  ALBUMIN 2.0*    Lab 06/25/12 1846  LIPASE 8*  AMYLASE --   No results found for this basename: AMMONIA:3 in the last 168 hours CBC:  Lab 06/26/12 0602 06/25/12 2328 06/25/12 1726  WBC 9.5 10.3 9.7  NEUTROABS -- -- 7.2  HGB 9.6* 10.2* 9.5*  HCT 31.1* 33.2* 30.7*  MCV 89.1 88.5 89.0  PLT 219 232 205   Cardiac Enzymes:  Lab 06/26/12 1432 06/26/12 0602 06/25/12 2329  CKTOTAL -- -- --  CKMB -- -- --  CKMBINDEX -- -- --  TROPONINI <0.30 <0.30 <0.30   CBG:  Lab 06/25/12 2315  GLUCAP 81    Iron Studies: No results found for this basename: IRON,TIBC,TRANSFERRIN,FERRITIN in the last 72 hours Studies/Results: Dg Chest 2 View  06/25/2012  *RADIOLOGY REPORT*  Clinical Data: Abdominal pain  CHEST - 2 VIEW  Comparison: 06/02/2012  Findings: There is a right chest wall dialysis catheter with tip in the right atrium.  Left chest wall AICD is noted with leads in the right atrial  appendage, coronary sinus and right ventricle. Previous median sternotomy and CABG procedure.  There is marked cardiac enlargement. The small pleural effusions and mild interstitial edema noted.  IMPRESSION:  1.  Marked cardiac enlargement. 2.  Small pleural effusions and mild interstitial edema suggestive of CHF.   Original Report Authenticated By: Signa Kell, M.D.    Ct Abdomen Pelvis W Contrast  06/26/2012  *RADIOLOGY REPORT*  Clinical Data: Worsening abdominal pain.  The patient has a history of complex infected pseudocyst with pancreatic necrosis.  Last admission 05/22/2012.  History of chronic pancreatitis.  CT ABDOMEN AND PELVIS WITH CONTRAST  Technique:  Multidetector CT imaging of the abdomen and pelvis was performed following the standard protocol during bolus administration of intravenous contrast.  Contrast: 80mL OMNIPAQUE IOHEXOL 300 MG/ML  SOLN  Comparison: 05/24/2012  Findings: Small right pleural effusion.  Atelectasis or infiltration in both lung bases.  Diffuse cardiac enlargement. Postoperative changes in the mediastinum.  There is diffuse abdominal and pelvic fluid consistent with ascites, increasing since previous study.  Diffuse subcutaneous soft tissue edema.  The liver demonstrates diffuse central hypo  perfusion pattern similar to previous study.  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  Spleen size is normal.  Gallbladder is surgically absent.  Visualization of the pancreas is limited due to obscuration by the surrounding fluid.  There appear to be gas collections throughout the pancreas or peripancreatic tissues with gas and fluid collection again demonstrated at the left upper quadrant extending from the region of the pancreatic tail into the splenic hilum.  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  Gas and fluid collections appear to be increasing mildly since the previous study. No discrete drainable collection is identified.  Visualization of  bowel is limited due to limited opacification.  However, there is suggestion of mild wall thickening consistent with edema in the jejunum.  Contrast material does pass through the colon suggesting no evidence of obstruction. The stomach is decompressed.  The colon is stool-filled.  The kidneys demonstrate small parenchymal cysts.  There is no hydronephrosis.  There is no contrast material shown in the renal collecting systems on the delayed images, suggesting possible delayed appearance but delayed images were obtained only 2-3 minutes after arterial phase in this may be artifactual.  Delayed appearance of contrast material in the renal collecting system cannot suggest renal hypoperfusion or medical renal disease.  Ascites extends into the pelvis.  The bladder is decompressed. Pelvic organs are not visualized.  Scattered diverticula in the sigmoid colon without definite diverticulitis.  The appendix is normal.  Normal alignment of the lumbar vertebrae without compression deformity or expansile destructive change.  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  Ascites.  Abnormal hepatic parenchymal perfusion with diffuse portal venous thrombosis.  Changes have progressed since previous study.  Probable edema in the jejunum which might be related to ascites.  Delayed appearance of contrast material in the renal collecting systems suggest renal parenchymal disease.   Original Report Authenticated By: Burman Nieves, M.D.    Medications:      . aspirin EC  81 mg Oral Daily  . carvedilol  6.25 mg Oral BID WC  . feeding supplement (NEPRO CARB STEADY)  237 mL Oral BID WC  . heparin  5,000 Units Subcutaneous Q8H  . hydrALAZINE  25 mg Oral Q8H  . HYDROmorphone  4 mg Oral Q4H  . isosorbide mononitrate  30 mg Oral Daily  . magnesium oxide  400 mg Oral Daily  . methadone  5 mg Oral TID  . multivitamin  1 tablet Oral QHS  . pantoprazole  40 mg Oral Daily  .  ranolazine  500 mg Oral Daily  . sevelamer  400 mg Oral Q breakfast  . simvastatin  10 mg Oral q1800  . sodium chloride  3 mL Intravenous Q12H  . sodium chloride  3 mL Intravenous Q12H  . sulfamethoxazole-trimethoprim  2 tablet Oral QHS   I  have reviewed scheduled and prn medications.  Physical Exam: General: Alert, thin chronically BF NAD Appropriate, talkative Heart: RRR, 2/6 sem lsb, no rub, Left chest AICD Lungs: CTA bilat.  Abdomen: BS +=, tender epigastric area no rebound, some distention. With small Ascites Extremities: Dialysis Access: 2 +,bipedal edema / Right ij perm cath    Out pat. HD Orders=   [facility]  On TTS, 4 hrs,  Edw= 53.5 kg,  heparin , EPO 1500 units q hd, no vit d last pth=178.2 (05/05/12) Bath= 3.0 k, 2.5 Ca, 1.0 mg   Was on VAncomycin q hd and Fortaz 2gm qhd  since 12/14./13    TFS 13%  Iron load order X 10 x  06/13/12  ( needing  7 more doses   Problem/Plan: 1.  Hyperkalemia Secondary to missed hd  X2 = recheck k this am, hd in am with 2.0 k bath/ noted op bath = 3.0 k, 2.5 ca. 1.0 mg 2. Abdominal Pain  With Chronic Pancreatitis and Necrotizing Abcess=   Surgery seeing as noted/CT scan yesterday  Noted compared to prior 05/24/12" progressive  Changes noted since prior study".and  noted  Pt. A poor surgical candidate  ( 10% ef) and she does not want surgery "unless absolutely necessary" Pain better with Antibiotics and Vol removed with hd yesterday.  Patient says no surgery, "tired of being cut"and risk is "too high". Has had multiple admissions for complications of pancreatitis over the last few years, including necrotizing pancreatitis, hemorrhagic pseudocyst, and polymicrobial sepsis in Apr 2013 felt to be due to ruptured pseudocyst. 3. ESRD - TTS at [facility] needs k rechecked today , hd in am/ And plans noted at [facility]  for new R avgg Dr. Hollace Hayward 07/13/12 4. Hx of cirrhosis with chronic ascites with repeated paracenteses at [facility], hx of esoph varices with  banding, and hx of portal vein and splenic vein thrombosis, the latter first noted by CT April or May 2013.  5. Anemia - hgb 9.6 yesterday  On aranesp, lower  Value can be related to her missing epo doses and infection/ needing 7 more doses infed 6. Secondary hyperparathyroidism - Ca  Corrected    12.3, no vit d phos 3.7  , 2.25 ca bath use on am hd/ on Renvela  Binder breakfast only 7. HTN/volume - some pulmonary edema on admit cxr  uf yesterday  = with wt to 52.9  (edw=52.5) and1 929 cc uf with hd  Decrease Coreg 6.25 to 3.125mg  as op center recently for bp control on hd/ also on Imdur 30 mg and Hydralazine 25 mg bid 8. CM( 10 % EF)/ CAD NSTEMI 04/2012= has AICD and DNR at op kidney center. Reconfirmed DNR order with patient and have changed order to "DNR" in system.   Lenny Pastel, PA-C [facility] Beeper [phone] 06/27/2012,11:10 AM  LOS: 2 days   Patient seen and examined and agree with assessment and plan as above with additions as indicated.  Vinson Moselle  MD [employer] [phone] pgr    [phone] cell 06/27/2012, 1:26 PM

## 2012-06-27 NOTE — Consult Note (Signed)
Reason for Consult: Necrotizing pancreatitis, ?pancreatic abscess Referring Physician: Dr. [name] is an 62 y.o. female.  HPI: 62 yr old female with known chronic pancreatitis, ESRD, CHF, EF of 10%, CAD, who is admitted with acute abdominal pain.  She has had similar episodes in the past and has been diagnosed with chronic pancreatitis.  She had a h/o gallstone pancreatitis and had cholecystectomy in early 2000.  She relates significant pain and nausea that started several days ago and progressed.  She was in the hospital recently with same symptoms.  Past Medical History  Diagnosis Date  . Gallstone pancreatitis     chronic pancreatitis s/p resection of gallbladder and panreas  . Coronary artery disease     s/p CABG 2008 with multiple PCIs  . Ischemic cardiomyopathy     Cath 04/2012, significant calcifications  . History of nonadherence to medical treatment   . Hyperlipidemia   . Systolic CHF     16/1096 echo EF 10%  . Esophageal varices   . Bacteremia     enterobacter 09/2011, MRSA 09/2011  . Fatty liver   . GIB (gastrointestinal bleeding)     per pt history   . 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 Perforated bowel     09/2011-no surgical intervention  . Pacemaker   . ICD (implantable cardiac defibrillator) in place   . Hypertension     "used to have HTN; now I'm low" (05/23/2012)  . CHF (congestive heart failure)   . Heart murmur     "just found that out this year" (05/23/2012)  . NSTEMI (non-ST elevated myocardial infarction)     04/2012; Trop peaked to 1.39; 05/23/2012 pt denies ever having MI  . Pneumonia 02/2012    "first time ever" (05/23/2012)  . Chronic bronchitis 1970's thru 2002    "went away when I stopped smoking in 2002" (05/23/2012)  . Diabetes mellitus     Diet controlled  . History of blood transfusion     "alot of them" (05/23/2012)  . End stage renal disease     T,Th,Sat HD by [facility] on [facility] (05/23/2012); h/o peritoneal  dialysis prior; uses right chest cath for HD, left arm graft placed by [facility] but not ready for use  . Iron (Fe) deficiency anemia     "severe" (05/23/2012)  . Fatty liver     with cirrhosis and esophageal varices     Past Surgical History  Procedure Date  . Esophagogastroduodenoscopy 09/15/2011    Procedure: ESOPHAGOGASTRODUODENOSCOPY (EGD);  Surgeon: Dorothy Belfast, MD;  Location: [facility] ENDOSCOPY;  Service: Endoscopy;  Laterality: N/A;  . Cholecystectomy 2003  . Tonsillectomy and adenoidectomy 1961  . Appendectomy 1973?  [name] Vaginal hysterectomy 1973?  [name] Tubal ligation 1972  . Dilation and curettage of uterus     "bunch from profuse bleeding in the 1970's" (05/23/2012)  . Coronary artery bypass graft 2008    CABG X4  . Coronary angioplasty with stent placement 2008    "1; day after CABG" (05/23/2012)  . Cardiac catheterization     "before 2008 and 3 wk ago" (05/23/2012)  . Insert / replace / remove pacemaker 06/2011    pacemaker ICD  . Cardiac defibrillator placement 06/2011  . Arteriovenous graft placement 03/2012    right antecub  . Insertion of dialysis catheter ~ 10/2011    right chest  . Peritoneal catheter insertion 08/2011  . Peritoneal catheter removal ~ 10/2011  Family History  Problem Relation Age of Onset  . Coronary artery disease Mother   . Hypertension Mother   . Diabetes Mother   . Diabetes Sister   . Anesthesia problems Neg Hx     Social History:  reports that she quit smoking about 12 years ago. Her smoking use included Cigarettes. She has a 10.23 pack-year smoking history. She has never used smokeless tobacco. She reports that she drinks alcohol. She reports that she does not use illicit drugs.  Allergies:  Allergies  Allergen Reactions  . Codeine Itching  . Morphine And Related Shortness Of Breath    SOB when given IV once "to me it was mild; I called out fast for help; never had to intubate" (05/23/2012)  . Ace Inhibitors Other (See Comments)     Unknown reaction but her physician stated that she can't take it (specifically lisinopril)  . Tylenol (Acetaminophen) Other (See Comments)    Patient states that the doctor told her she has a Fatty Liver.    Medications: I have reviewed the patient's current medications.  Results for orders placed during the hospital encounter of 06/25/12 (from the past 48 hour(s))  COMPREHENSIVE METABOLIC PANEL     Status: Abnormal   Collection Time   06/25/12  5:26 PM      Component Value Range Comment   Sodium 130 (*) 135 - 145 mEq/L    Potassium 5.4 (*) 3.5 - 5.1 mEq/L    Chloride 92 (*) 96 - 112 mEq/L    CO2 22  19 - 32 mEq/L    Glucose, Bld 64 (*) 70 - 99 mg/dL    BUN 60 (*) 6 - 23 mg/dL    Creatinine, Ser 4.54 (*) 0.50 - 1.10 mg/dL    Calcium 09.8 (*) 8.4 - 10.5 mg/dL    Total Protein 7.3  6.0 - 8.3 g/dL    Albumin 2.0 (*) 3.5 - 5.2 g/dL    AST 14  0 - 37 U/L    ALT 5  0 - 35 U/L    Alkaline Phosphatase 118 (*) 39 - 117 U/L    Total Bilirubin 0.3  0.3 - 1.2 mg/dL    GFR calc non Af Amer 7 (*) >90 mL/min    GFR calc Af Amer 8 (*) >90 mL/min   CBC WITH DIFFERENTIAL     Status: Abnormal   Collection Time   06/25/12  5:26 PM      Component Value Range Comment   WBC 9.7  4.0 - 10.5 K/uL    RBC 3.45 (*) 3.87 - 5.11 MIL/uL    Hemoglobin 9.5 (*) 12.0 - 15.0 g/dL    HCT 11.9 (*) 14.7 - 46.0 %    MCV 89.0  78.0 - 100.0 fL    MCH 27.5  26.0 - 34.0 pg    MCHC 30.9  30.0 - 36.0 g/dL    RDW 82.9 (*) 56.2 - 15.5 %    Platelets 205  150 - 400 K/uL    Neutrophils Relative 74  43 - 77 %    Neutro Abs 7.2  1.7 - 7.7 K/uL    Lymphocytes Relative 16  12 - 46 %    Lymphs Abs 1.6  0.7 - 4.0 K/uL    Monocytes Relative 8  3 - 12 %    Monocytes Absolute 0.8  0.1 - 1.0 K/uL    Eosinophils Relative 1  0 - 5 %    Eosinophils Absolute 0.1  0.0 - 0.7 K/uL    Basophils Relative 0  0 - 1 %    Basophils Absolute 0.0  0.0 - 0.1 K/uL   LIPASE, BLOOD     Status: Abnormal   Collection Time   06/25/12  6:46 PM       Component Value Range Comment   Lipase 8 (*) 11 - 59 U/L   GLUCOSE, CAPILLARY     Status: Normal   Collection Time   06/25/12 11:15 PM      Component Value Range Comment   Glucose-Capillary 81  70 - 99 mg/dL   CBC     Status: Abnormal   Collection Time   06/25/12 11:28 PM      Component Value Range Comment   WBC 10.3  4.0 - 10.5 K/uL    RBC 3.75 (*) 3.87 - 5.11 MIL/uL    Hemoglobin 10.2 (*) 12.0 - 15.0 g/dL    HCT 16.1 (*) 09.6 - 46.0 %    MCV 88.5  78.0 - 100.0 fL    MCH 27.2  26.0 - 34.0 pg    MCHC 30.7  30.0 - 36.0 g/dL    RDW 04.5 (*) 40.9 - 15.5 %    Platelets 232  150 - 400 K/uL   CREATININE, SERUM     Status: Abnormal   Collection Time   06/25/12 11:28 PM      Component Value Range Comment   Creatinine, Ser 6.37 (*) 0.50 - 1.10 mg/dL    GFR calc non Af Amer 6 (*) >90 mL/min    GFR calc Af Amer 7 (*) >90 mL/min   TROPONIN I     Status: Normal   Collection Time   06/25/12 11:29 PM      Component Value Range Comment   Troponin I <0.30  <0.30 ng/mL   LACTIC ACID, PLASMA     Status: Normal   Collection Time   06/25/12 11:29 PM      Component Value Range Comment   Lactic Acid, Venous 1.4  0.5 - 2.2 mmol/L   MRSA PCR SCREENING     Status: Normal   Collection Time   06/26/12  3:20 AM      Component Value Range Comment   MRSA by PCR NEGATIVE  NEGATIVE   CBC     Status: Abnormal   Collection Time   06/26/12  6:02 AM      Component Value Range Comment   WBC 9.5  4.0 - 10.5 K/uL    RBC 3.49 (*) 3.87 - 5.11 MIL/uL    Hemoglobin 9.6 (*) 12.0 - 15.0 g/dL    HCT 81.1 (*) 91.4 - 46.0 %    MCV 89.1  78.0 - 100.0 fL    MCH 27.5  26.0 - 34.0 pg    MCHC 30.9  30.0 - 36.0 g/dL    RDW 78.2 (*) 95.6 - 15.5 %    Platelets 219  150 - 400 K/uL   BASIC METABOLIC PANEL     Status: Abnormal   Collection Time   06/26/12  6:02 AM      Component Value Range Comment   Sodium 132 (*) 135 - 145 mEq/L    Potassium 6.0 (*) 3.5 - 5.1 mEq/L    Chloride 96  96 - 112 mEq/L    CO2 21  19 - 32  mEq/L    Glucose, Bld 86  70 - 99 mg/dL    BUN 67 (*) 6 - 23  mg/dL    Creatinine, Ser 4.09 (*) 0.50 - 1.10 mg/dL    Calcium 81.1 (*) 8.4 - 10.5 mg/dL    GFR calc non Af Amer 6 (*) >90 mL/min    GFR calc Af Amer 7 (*) >90 mL/min   TROPONIN I     Status: Normal   Collection Time   06/26/12  6:02 AM      Component Value Range Comment   Troponin I <0.30  <0.30 ng/mL   HEPATITIS B SURFACE ANTIGEN     Status: Normal   Collection Time   06/26/12  9:55 AM      Component Value Range Comment   Hepatitis B Surface Ag NEGATIVE  NEGATIVE   TROPONIN I     Status: Normal   Collection Time   06/26/12  2:32 PM      Component Value Range Comment   Troponin I <0.30  <0.30 ng/mL   URINALYSIS, ROUTINE W REFLEX MICROSCOPIC     Status: Abnormal   Collection Time   06/27/12  6:36 AM      Component Value Range Comment   Color, Urine YELLOW  YELLOW    APPearance CLOUDY (*) CLEAR    Specific Gravity, Urine 1.018  1.005 - 1.030    pH 8.0  5.0 - 8.0    Glucose, UA NEGATIVE  NEGATIVE mg/dL    Hgb urine dipstick MODERATE (*) NEGATIVE    Bilirubin Urine SMALL (*) NEGATIVE    Ketones, ur NEGATIVE  NEGATIVE mg/dL    Protein, ur >914 (*) NEGATIVE mg/dL    Urobilinogen, UA 0.2  0.0 - 1.0 mg/dL    Nitrite NEGATIVE  NEGATIVE    Leukocytes, UA SMALL (*) NEGATIVE   URINE MICROSCOPIC-ADD ON     Status: Abnormal   Collection Time   06/27/12  6:36 AM      Component Value Range Comment   Squamous Epithelial / LPF MANY (*) RARE    WBC, UA 7-10  <3 WBC/hpf    RBC / HPF 11-20  <3 RBC/hpf    Bacteria, UA RARE  RARE    Urine-Other LESS THAN 10 mL OF URINE SUBMITTED       Dg Chest 2 View  06/25/2012  *RADIOLOGY REPORT*  Clinical Data: Abdominal pain  CHEST - 2 VIEW  Comparison: 06/02/2012  Findings: There is a right chest wall dialysis catheter with tip in the right atrium.  Left chest wall AICD is noted with leads in the right atrial appendage, coronary sinus and right ventricle. Previous median sternotomy and CABG  procedure.  There is marked cardiac enlargement. The small pleural effusions and mild interstitial edema noted.  IMPRESSION:  1.  Marked cardiac enlargement. 2.  Small pleural effusions and mild interstitial edema suggestive of CHF.   Original Report Authenticated By: Signa Kell, M.D.    Ct Abdomen Pelvis W Contrast  06/26/2012  *RADIOLOGY REPORT*  Clinical Data: Worsening abdominal pain.  The patient has a history of complex infected pseudocyst with pancreatic necrosis.  Last admission 05/22/2012.  History of chronic pancreatitis.  CT ABDOMEN AND PELVIS WITH CONTRAST  Technique:  Multidetector CT imaging of the abdomen and pelvis was performed following the standard protocol during bolus administration of intravenous contrast.  Contrast: 80mL OMNIPAQUE IOHEXOL 300 MG/ML  SOLN  Comparison: 05/24/2012  Findings: Small right pleural effusion.  Atelectasis or infiltration in both lung bases.  Diffuse cardiac enlargement. Postoperative changes in the mediastinum.  There is diffuse abdominal and pelvic fluid  consistent with ascites, increasing since previous study.  Diffuse subcutaneous soft tissue edema.  The liver demonstrates diffuse central hypo perfusion pattern similar to previous study.  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  Spleen size is normal.  Gallbladder is surgically absent.  Visualization of the pancreas is limited due to obscuration by the surrounding fluid.  There appear to be gas collections throughout the pancreas or peripancreatic tissues with gas and fluid collection again demonstrated at the left upper quadrant extending from the region of the pancreatic tail into the splenic hilum.  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  Gas and fluid collections appear to be increasing mildly since the previous study. No discrete drainable collection is identified.  Visualization of bowel is limited due to limited opacification.  However, there is suggestion of mild  wall thickening consistent with edema in the jejunum.  Contrast material does pass through the colon suggesting no evidence of obstruction. The stomach is decompressed.  The colon is stool-filled.  The kidneys demonstrate small parenchymal cysts.  There is no hydronephrosis.  There is no contrast material shown in the renal collecting systems on the delayed images, suggesting possible delayed appearance but delayed images were obtained only 2-3 minutes after arterial phase in this may be artifactual.  Delayed appearance of contrast material in the renal collecting system cannot suggest renal hypoperfusion or medical renal disease.  Ascites extends into the pelvis.  The bladder is decompressed. Pelvic organs are not visualized.  Scattered diverticula in the sigmoid colon without definite diverticulitis.  The appendix is normal.  Normal alignment of the lumbar vertebrae without compression deformity or expansile destructive change.  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  Ascites.  Abnormal hepatic parenchymal perfusion with diffuse portal venous thrombosis.  Changes have progressed since previous study.  Probable edema in the jejunum which might be related to ascites.  Delayed appearance of contrast material in the renal collecting systems suggest renal parenchymal disease.   Original Report Authenticated By: Burman Nieves, M.D.     Review of Systems  Constitutional: Positive for malaise/fatigue. Negative for fever and chills.  HENT: Negative.   Eyes: Negative.   Respiratory: Negative.   Cardiovascular: Negative.   Gastrointestinal: Positive for nausea and abdominal pain. Negative for vomiting.  Genitourinary: Negative.   Musculoskeletal: Negative.   Skin: Negative.   Neurological: Positive for weakness.  Endo/Heme/Allergies: Negative.   Psychiatric/Behavioral: Negative.    Blood pressure 126/85, pulse 81, temperature 97.8 F (36.6 C),  temperature source Oral, resp. rate 17, height 5\' 4"  (1.626 m), weight 116 lb 2.9 oz (52.7 kg), SpO2 95.00%. Physical Exam  Constitutional: She is oriented to person, place, and time. No distress.       Chronically ill appearing   HENT:  Head: Normocephalic and atraumatic.  Eyes: Conjunctivae normal are normal. Pupils are equal, round, and reactive to light.  Neck: Normal range of motion. Neck supple.       Right perm-cath  Cardiovascular: Normal rate and regular rhythm.   Respiratory: Effort normal and breath sounds normal.  GI: Bowel sounds are normal. She exhibits distension. There is tenderness (diffusely). There is guarding.  Genitourinary:       deferred  Musculoskeletal: She exhibits no edema.  Neurological: She is alert and oriented to person, place, and time.  Skin: Skin is warm and dry.  Psychiatric: She has a normal mood and affect. Her behavior is normal.       Good  insight    Assessment/Plan: 1. Chronic pancreatitis/necrotizing pancreatitis/pancreatic abscess: Dr. Carolynne Edouard will be by to see the patient later today but the CT findings have been seen prior.  The patient is not a surgical candidate.  Recommend continuing conservative management.  Pt has very good insight and understands she is not a surgical candidate and reports that she would not want surgery unless absolutely necessary.  [name] 06/27/2012, 9:40 AM

## 2012-06-28 ENCOUNTER — Encounter (HOSPITAL_COMMUNITY): Payer: Self-pay | Admitting: Radiology

## 2012-06-28 ENCOUNTER — Inpatient Hospital Stay (HOSPITAL_COMMUNITY): Payer: Non-veteran care

## 2012-06-28 DIAGNOSIS — K861 Other chronic pancreatitis: Secondary | ICD-10-CM

## 2012-06-28 DIAGNOSIS — K8689 Other specified diseases of pancreas: Secondary | ICD-10-CM | POA: Insufficient documentation

## 2012-06-28 LAB — CBC
HCT: 29.1 % — ABNORMAL LOW (ref 36.0–46.0)
Hemoglobin: 9.1 g/dL — ABNORMAL LOW (ref 12.0–15.0)
MCH: 28 pg (ref 26.0–34.0)
RBC: 3.25 MIL/uL — ABNORMAL LOW (ref 3.87–5.11)

## 2012-06-28 LAB — URINE CULTURE

## 2012-06-28 LAB — RENAL FUNCTION PANEL
BUN: 36 mg/dL — ABNORMAL HIGH (ref 6–23)
Calcium: 10.5 mg/dL (ref 8.4–10.5)
Creatinine, Ser: 4.39 mg/dL — ABNORMAL HIGH (ref 0.50–1.10)
Glucose, Bld: 109 mg/dL — ABNORMAL HIGH (ref 70–99)
Phosphorus: 5.2 mg/dL — ABNORMAL HIGH (ref 2.3–4.6)

## 2012-06-28 LAB — MAGNESIUM: Magnesium: 2.4 mg/dL (ref 1.5–2.5)

## 2012-06-28 MED ORDER — SODIUM CHLORIDE 0.9 % IV SOLN: 100.0000 mL | INTRAVENOUS | Status: DC | PRN

## 2012-06-28 MED ORDER — ALTEPLASE 2 MG IJ SOLR: 2.0000 mg | Freq: Once | INTRAMUSCULAR | Status: DC | PRN

## 2012-06-28 MED ORDER — HYDROMORPHONE HCL 2 MG PO TABS
ORAL_TABLET | ORAL | Status: AC
  Filled 2012-06-28: qty 2

## 2012-06-28 MED ORDER — HYDROMORPHONE HCL PF 1 MG/ML IJ SOLN
INTRAMUSCULAR | Status: AC
  Filled 2012-06-28: qty 1

## 2012-06-28 MED ORDER — POLYETHYLENE GLYCOL 3350 17 G PO PACK
17.0000 g | PACK | Freq: Every day | ORAL | Status: DC | PRN
  Filled 2012-06-28: qty 1

## 2012-06-28 MED ORDER — PENTAFLUOROPROP-TETRAFLUOROETH EX AERO: 1.0000 "application " | INHALATION_SPRAY | CUTANEOUS | Status: DC | PRN

## 2012-06-28 MED ORDER — NEPRO/CARBSTEADY PO LIQD: 237.0000 mL | ORAL | Status: DC | PRN

## 2012-06-28 MED ORDER — HYDROMORPHONE HCL PF 1 MG/ML IJ SOLN
INTRAMUSCULAR | Status: AC
  Administered 2012-06-28: 2 mg via INTRAVENOUS
  Filled 2012-06-28: qty 1

## 2012-06-28 MED ORDER — LIDOCAINE-PRILOCAINE 2.5-2.5 % EX CREA: 1.0000 "application " | TOPICAL_CREAM | CUTANEOUS | Status: DC | PRN

## 2012-06-28 MED ORDER — DOCUSATE SODIUM 100 MG PO CAPS
100.0000 mg | ORAL_CAPSULE | Freq: Every day | ORAL | Status: DC
  Administered 2012-06-28 – 2012-07-06 (×9): 100 mg via ORAL
  Filled 2012-06-28 (×10): qty 1

## 2012-06-28 MED ORDER — LIDOCAINE HCL (PF) 1 % IJ SOLN: 5.0000 mL | INTRAMUSCULAR | Status: DC | PRN

## 2012-06-28 MED ORDER — FENTANYL CITRATE 0.05 MG/ML IJ SOLN
INTRAMUSCULAR | Status: AC
  Filled 2012-06-28: qty 4

## 2012-06-28 MED ORDER — FENTANYL CITRATE 0.05 MG/ML IJ SOLN
INTRAMUSCULAR | Status: AC | PRN
  Administered 2012-06-28: 25 ug via INTRAVENOUS

## 2012-06-28 MED ORDER — HEPARIN SODIUM (PORCINE) 1000 UNIT/ML DIALYSIS
1600.0000 [IU] | INTRAMUSCULAR | Status: DC | PRN
  Administered 2012-06-28: 1600 [IU] via INTRAVENOUS_CENTRAL

## 2012-06-28 MED ORDER — MIDAZOLAM HCL 2 MG/2ML IJ SOLN
INTRAMUSCULAR | Status: AC
  Filled 2012-06-28: qty 4

## 2012-06-28 MED ORDER — BISACODYL 5 MG PO TBEC
5.0000 mg | DELAYED_RELEASE_TABLET | Freq: Every day | ORAL | Status: DC | PRN
  Administered 2012-06-28: 5 mg via ORAL
  Filled 2012-06-28: qty 1

## 2012-06-28 MED ORDER — HEPARIN SODIUM (PORCINE) 1000 UNIT/ML DIALYSIS: 1000.0000 [IU] | INTRAMUSCULAR | Status: DC | PRN

## 2012-06-28 MED ORDER — MIDAZOLAM HCL 2 MG/2ML IJ SOLN
INTRAMUSCULAR | Status: AC | PRN
  Administered 2012-06-28: 1 mg via INTRAVENOUS

## 2012-06-28 NOTE — Progress Notes (Signed)
[facility] PROGRESS NOTE  [name] AVW:[id number] DOB: [birth year] DOA: 06/25/2012 PCP: Jyl Heinz, MD  Assessment/Plan:  1-Abdominal pain: Patient with history of complex infected pseudoocyst with pancreatic necrosis last admission 05-22-2012. She was treated with IV antibiotics ceftazidime, and Vancomycin until 06/07/12. Suppose to have CT scan 06-10-2012. She has history of chronic pancreatitis. She presents with worsening abdominal pain. 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 Ascites. Abnormal hepatic parenchymal  perfusion with diffuse portal venous thrombosis. Changes have progressed since previous study.  -Surgery consulted. Patient refuse procedure and patient is not candidate for surgery. See Consult note. -ID consult. Appreciate Dr Ninetta Lights help. For paracentesis today for culture.  -No on antibiotics. Start antibiotics per ID. 2-ESRD: Continue with Dialysis. Correction of electrolytes during dialysis. No complaint with dialysis, missed last 2 session.  3-Ischemic cardiomyopathy status [employer] pacemaker/AICD with EF 10% in 04/2012: Continue with Zocor, IMDUR, Coreg, aspirin.  4-Hyperkalemia: Correction with dialysis.  5-Weakness: PT, OT . Neuro exam non focal.    Code Status: full. Family Communication: Care discussed with patient.  Disposition Plan: To be determine.    Consultants:  Nephrologist.   Procedures:  None.  Antibiotics:  Bactrim   HPI/Subjective: Patient still complaining of abdominal pain, no worse. She agree to have paracentesis.  Objective: Filed Vitals:   06/28/12 1609 06/28/12 1614 06/28/12 1700 06/28/12 2144  BP: 120/84 119/80 124/80 109/73  Pulse: 77 78 78 74  Temp:   98.8 F (37.1 C) 98.5 F (36.9 C)  TempSrc:   Oral Oral  Resp: 18 18 18 18   Height:      Weight:    53.8 kg (118 lb 9.7 oz)  SpO2: 99% 100% 93% 98%    Intake/Output Summary (Last 24 hours)  at 06/28/12 2209 Last data filed at 06/28/12 1700  Gross per 24 hour  Intake    337 ml  Output   1802 ml  Net  -1465 ml   Filed Weights   06/28/12 0643 06/28/12 1112 06/28/12 2144  Weight: 54.9 kg (121 lb 0.5 oz) 52.6 kg (115 lb 15.4 oz) 53.8 kg (118 lb 9.7 oz)    Exam:   General:  No distress.  Cardiovascular: S 1, S 2, RRR.   Respiratory: CTA.  Abdomen: BS present, soft, NR, NG. Tenderness palpation.   Data Reviewed: Basic Metabolic Panel:  Lab 06/28/12 8295 06/28/12 0500 06/26/12 0602 06/25/12 2328 06/25/12 1726  NA -- 132* 132* -- 130*  K -- 4.3 6.0* -- 5.4*  CL -- 94* 96 -- 92*  CO2 -- 25 21 -- 22  GLUCOSE -- 109* 86 -- 64*  BUN -- 36* 67* -- 60*  CREATININE -- 4.39* 6.62* 6.37* 6.17*  CALCIUM -- 10.5 10.7* -- 11.2*  MG 2.4 -- -- -- --  PHOS -- 5.2* -- -- --   Liver Function Tests:  Lab 06/28/12 0500 06/25/12 1726  AST -- 14  ALT -- 5  ALKPHOS -- 118*  BILITOT -- 0.3  PROT -- 7.3  ALBUMIN 1.9* 2.0*    Lab 06/25/12 1846  LIPASE 8*  AMYLASE --   No results found for this basename: AMMONIA:5 in the last 168 hours CBC:  Lab 06/28/12 0659 06/26/12 0602 06/25/12 2328 06/25/12 1726  WBC 9.4 9.5 10.3 9.7  NEUTROABS -- -- -- 7.2  HGB 9.1* 9.6* 10.2* 9.5*  HCT 29.1* 31.1* 33.2* 30.7*  MCV 89.5 89.1 88.5 89.0  PLT 166 219 232 205  Cardiac Enzymes:  Lab 06/26/12 1432 06/26/12 0602 06/25/12 2329  CKTOTAL -- -- --  CKMB -- -- --  CKMBINDEX -- -- --  TROPONINI <0.30 <0.30 <0.30   BNP (last 3 results)  Basename 06/02/12 1944 05/08/12 0515 04/21/12 1419  PROBNP >70000.0* >70000.0* >70000.0*   CBG:  Lab 06/25/12 2315  GLUCAP 81    Recent Results (from the past 240 hour(s))  MRSA PCR SCREENING     Status: Normal   Collection Time   06/26/12  3:20 AM      Component Value Range Status Comment   MRSA by PCR NEGATIVE  NEGATIVE Final   URINE CULTURE     Status: Normal   Collection Time   06/27/12  6:36 AM      Component Value Range Status  Comment   Specimen Description URINE, RANDOM   Final    Special Requests NONE   Final    Culture  Setup Time 06/27/2012 16:26   Final    Colony Count 40,000 COLONIES/ML   Final    Culture     Final    Value: Multiple bacterial morphotypes present, none predominant. Suggest appropriate recollection if clinically indicated.   Report Status 06/28/2012 FINAL   Final      Studies: Ct Abdomen Pelvis W Contrast  06/26/2012  *RADIOLOGY REPORT*  Clinical Data: Worsening abdominal pain.  The patient has a history of complex infected pseudocyst with pancreatic necrosis.  Last admission 05/22/2012.  History of chronic pancreatitis.  CT ABDOMEN AND PELVIS WITH CONTRAST  Technique:  Multidetector CT imaging of the abdomen and pelvis was performed following the standard protocol during bolus administration of intravenous contrast.  Contrast: 80mL OMNIPAQUE IOHEXOL 300 MG/ML  SOLN  Comparison: 05/24/2012  Findings: Small right pleural effusion.  Atelectasis or infiltration in both lung bases.  Diffuse cardiac enlargement. Postoperative changes in the mediastinum.  There is diffuse abdominal and pelvic fluid consistent with ascites, increasing since previous study.  Diffuse subcutaneous soft tissue edema.  The liver demonstrates diffuse central hypo perfusion pattern similar to previous study.  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  Spleen size is normal.  Gallbladder is surgically absent.  Visualization of the pancreas is limited due to obscuration by the surrounding fluid.  There appear to be gas collections throughout the pancreas or peripancreatic tissues with gas and fluid collection again demonstrated at the left upper quadrant extending from the region of the pancreatic tail into the splenic hilum.  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  Gas and fluid collections appear to be increasing mildly since the previous study. No discrete drainable collection is identified.   Visualization of bowel is limited due to limited opacification.  However, there is suggestion of mild wall thickening consistent with edema in the jejunum.  Contrast material does pass through the colon suggesting no evidence of obstruction. The stomach is decompressed.  The colon is stool-filled.  The kidneys demonstrate small parenchymal cysts.  There is no hydronephrosis.  There is no contrast material shown in the renal collecting systems on the delayed images, suggesting possible delayed appearance but delayed images were obtained only 2-3 minutes after arterial phase in this may be artifactual.  Delayed appearance of contrast material in the renal collecting system cannot suggest renal hypoperfusion or medical renal disease.  Ascites extends into the pelvis.  The bladder is decompressed. Pelvic organs are not visualized.  Scattered diverticula in the sigmoid colon without definite diverticulitis.  The appendix is normal.  Normal alignment of the lumbar vertebrae without compression deformity or expansile destructive change.  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  Ascites.  Abnormal hepatic parenchymal perfusion with diffuse portal venous thrombosis.  Changes have progressed since previous study.  Probable edema in the jejunum which might be related to ascites.  Delayed appearance of contrast material in the renal collecting systems suggest renal parenchymal disease.   Original Report Authenticated By: Burman Nieves, M.D.     Scheduled Meds:    . aspirin EC  81 mg Oral Daily  . carvedilol  3.125 mg Oral BID WC  . darbepoetin  100 mcg Intravenous Q Sat-HD  . docusate sodium  100 mg Oral QHS  . feeding supplement (NEPRO CARB STEADY)  237 mL Oral BID WC  . fentaNYL      . hydrALAZINE  25 mg Oral Q8H  . HYDROmorphone  4 mg Oral Q4H  . iron dextran (INFED/DEXFERRUM) infusion  100 mg Intravenous Q T,Th,Sa-HD  . isosorbide mononitrate  30  mg Oral Daily  . magnesium oxide  400 mg Oral Daily  . methadone  5 mg Oral TID  . midazolam      . multivitamin  1 tablet Oral QHS  . pantoprazole  40 mg Oral Daily  . ranolazine  500 mg Oral Daily  . sevelamer  400 mg Oral Q breakfast  . simvastatin  10 mg Oral q1800  . sodium chloride  3 mL Intravenous Q12H  . sodium chloride  3 mL Intravenous Q12H  . sulfamethoxazole-trimethoprim  2 tablet Oral QHS   Continuous Infusions:   Active Problems:  Pancreatitis, chronic  ESRD on hemodialysis    Time spent: 35 minutes.     Heleena Miceli  [facility] Pager [phone]. If 8PM-8AM, please contact night-coverage at [email or web address], password [facility] 06/28/2012, 10:09 PM  LOS: 3 days

## 2012-06-28 NOTE — H&P (Signed)
Agree.  Will try to do procedure today after HD.

## 2012-06-28 NOTE — Progress Notes (Signed)
Subjective:  On hd, continued abd pain , ID consulting  now Objective Vital signs in last 24 hours: Filed Vitals:   06/28/12 0659 06/28/12 0730 06/28/12 0800 06/28/12 0829  BP: 111/78 106/68 114/75 99/71  Pulse: 79 70 81 75  Temp:      TempSrc:      Resp:  18 11 17   Height:      Weight:      SpO2:   99% 96%   Weight change: -0.6 kg (-1 lb 5.2 oz)  Intake/Output Summary (Last 24 hours) at 06/28/12 0904 Last data filed at 06/27/12 1839  Gross per 24 hour  Intake    370 ml  Output      0 ml  Net    370 ml   Labs: Basic Metabolic Panel:  Lab 06/28/12 1478 06/26/12 0602 06/25/12 2328 06/25/12 1726  NA 132* 132* -- 130*  K 4.3 6.0* -- 5.4*  CL 94* 96 -- 92*  CO2 25 21 -- 22  GLUCOSE 109* 86 -- 64*  BUN 36* 67* -- 60*  CREATININE 4.39* 6.62* 6.37* --  CALCIUM 10.5 10.7* -- 11.2*  ALB -- -- -- --  PHOS 5.2* -- -- --   Liver Function Tests:  Lab 06/28/12 0500 06/25/12 1726  AST -- 14  ALT -- 5  ALKPHOS -- 118*  BILITOT -- 0.3  PROT -- 7.3  ALBUMIN 1.9* 2.0*    Lab 06/25/12 1846  LIPASE 8*  AMYLASE --   No results found for this basename: AMMONIA:3 in the last 168 hours CBC:  Lab 06/28/12 0659 06/26/12 0602 06/25/12 2328 06/25/12 1726  WBC 9.4 9.5 10.3 --  NEUTROABS -- -- -- 7.2  HGB 9.1* 9.6* 10.2* --  HCT 29.1* 31.1* 33.2* --  MCV 89.5 89.1 88.5 89.0  PLT 166 219 232 --   Cardiac Enzymes:  Lab 06/26/12 1432 06/26/12 0602 06/25/12 2329  CKTOTAL -- -- --  CKMB -- -- --  CKMBINDEX -- -- --  TROPONINI <0.30 <0.30 <0.30   CBG:  Lab 06/25/12 2315  GLUCAP 81    Iron Studies: No results found for this basename: IRON,TIBC,TRANSFERRIN,FERRITIN in the last 72 hours Studies/Results: Ct Abdomen Pelvis W Contrast  06/26/2012  *RADIOLOGY REPORT*  Clinical Data: Worsening abdominal pain.  The patient has a history of complex infected pseudocyst with pancreatic necrosis.  Last admission 05/22/2012.  History of chronic pancreatitis.  CT ABDOMEN AND PELVIS WITH  CONTRAST  Technique:  Multidetector CT imaging of the abdomen and pelvis was performed following the standard protocol during bolus administration of intravenous contrast.  Contrast: 80mL OMNIPAQUE IOHEXOL 300 MG/ML  SOLN  Comparison: 05/24/2012  Findings: Small right pleural effusion.  Atelectasis or infiltration in both lung bases.  Diffuse cardiac enlargement. Postoperative changes in the mediastinum.  There is diffuse abdominal and pelvic fluid consistent with ascites, increasing since previous study.  Diffuse subcutaneous soft tissue edema.  The liver demonstrates diffuse central hypo perfusion pattern similar to previous study.  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  Spleen size is normal.  Gallbladder is surgically absent.  Visualization of the pancreas is limited due to obscuration by the surrounding fluid.  There appear to be gas collections throughout the pancreas or peripancreatic tissues with gas and fluid collection again demonstrated at the left upper quadrant extending from the region of the pancreatic tail into the splenic hilum.  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  Gas and fluid collections appear to be  increasing mildly since the previous study. No discrete drainable collection is identified.  Visualization of bowel is limited due to limited opacification.  However, there is suggestion of mild wall thickening consistent with edema in the jejunum.  Contrast material does pass through the colon suggesting no evidence of obstruction. The stomach is decompressed.  The colon is stool-filled.  The kidneys demonstrate small parenchymal cysts.  There is no hydronephrosis.  There is no contrast material shown in the renal collecting systems on the delayed images, suggesting possible delayed appearance but delayed images were obtained only 2-3 minutes after arterial phase in this may be artifactual.  Delayed appearance of contrast material in the renal collecting  system cannot suggest renal hypoperfusion or medical renal disease.  Ascites extends into the pelvis.  The bladder is decompressed. Pelvic organs are not visualized.  Scattered diverticula in the sigmoid colon without definite diverticulitis.  The appendix is normal.  Normal alignment of the lumbar vertebrae without compression deformity or expansile destructive change.  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  Ascites.  Abnormal hepatic parenchymal perfusion with diffuse portal venous thrombosis.  Changes have progressed since previous study.  Probable edema in the jejunum which might be related to ascites.  Delayed appearance of contrast material in the renal collecting systems suggest renal parenchymal disease.   Original Report Authenticated By: Burman Nieves, M.D.    Medications:      . aspirin EC  81 mg Oral Daily  . carvedilol  3.125 mg Oral BID WC  . darbepoetin  100 mcg Intravenous Q Sat-HD  . feeding supplement (NEPRO CARB STEADY)  237 mL Oral BID WC  . hydrALAZINE  25 mg Oral Q8H  . HYDROmorphone  4 mg Oral Q4H  . iron dextran (INFED/DEXFERRUM) infusion  100 mg Intravenous Q T,Th,Sa-HD  . isosorbide mononitrate  30 mg Oral Daily  . magnesium oxide  400 mg Oral Daily  . methadone  5 mg Oral TID  . multivitamin  1 tablet Oral QHS  . pantoprazole  40 mg Oral Daily  . ranolazine  500 mg Oral Daily  . sevelamer  400 mg Oral Q breakfast  . simvastatin  10 mg Oral q1800  . sodium chloride  3 mL Intravenous Q12H  . sodium chloride  3 mL Intravenous Q12H  . sulfamethoxazole-trimethoprim  2 tablet Oral QHS    Physical Exam:  General: on HD, Alert, thin chronically BF NAD Appropriate  Heart: RRR, 2/6 sem lsb, no rub, Left chest AICD  Lungs: CTA bilat.  Abdomen: BS +=, tender diffusely   no rebound, some distention. With small Ascites  Extremities: Dialysis Access: 1 +,bipedal edema / Right ij perm cath patent on hd  Out  pat. HD Orders= [facility] On TTS, 4 hrs, Edw= 53.5 kg, heparin , EPO 1500 units q hd, no vit d last pth=178.2 (05/05/12) Bath= 3.0 k, 2.5 Ca, 1.0 mg Was on VAncomycin q hd and Fortaz 2gm qhd since 12/14./13 TFS 13% Iron load order X 10 x 06/13/12 ( needing 7 more doses )  Problem/Plan:   1. Abdominal Pain With Chronic Pancreatitis and Necrotizing Abcess=noted /CT scan Jan 12/ 2013=  Noted compared to prior 05/24/12" progressive Changes noted since prior study".and noted Pt. A poor surgical candidate ( 10% ef) And Pt says " No" to surgery "tired of being cut on"  ID noted yesterday   Rec. IR obtain fluid for CS  And hold Antibiotics until results 2.  ESRD - with hyperkalemia 6.0 on admit sec to missed hd X 2 , (TS at [facility]). K Yesterday 4.3 and pending this am pre hd > /  HD Access Plans= Scheduled at [facility] med center for new R avgg Dr. Hollace Hayward 07/13/12 3. Hx of cirrhosis/chronic ascites with repeated paracenteses at [facility], hx of esoph varices with banding, and hx of portal vein and splenic vein thrombosis, the latter first noted by CT April or May 2013.  4. Anemia - hgb 9.6 > 9.1 On aranesp 100,with her lower Value  related to her missing epo doses and infection/ needing 7 more doses infed 5. Secondary hyperparathyroidism - Ca Corrected 12.1, no vit d phos 2.4 , 2.25 ca bath  on am hd/ change to 2.0 ca bath/ on Renvela Binder breakfast only will hold  6. HTN/volume - some pulmonary edema on admit cxr/ uf  admit = with wt to 52.9 (edw=52.5) and1 929 cc uf with hd/  This am 54.9 kg and attempt ing 2 l uf Decrease Coreg 6.25 to 3.125mg  as op center recently for bp control on hd/ also on Imdur 30 mg and on  Hydralazine 25 mg bid/ taper back if bp drops any on hd 7. CM( 10 % EF)/ CAD NSTEMI 04/2012= has AICD and DNR at op kidney center. Reconfirmed DNR order with patient and have changed order to "DNR"  Lenny Pastel, PA-C [facility] [phone] 06/28/2012,9:04 AM  LOS: 3 days    Patient seen and examined and agree with assessment and plan as above.  Vinson Moselle  MD [facility] [phone] pgr    [phone] cell 06/28/2012, 12:49 PM

## 2012-06-28 NOTE — Procedures (Signed)
I was present at this dialysis session. I have reviewed the session itself and made appropriate changes.   Vinson Moselle, MD [employer] 06/28/2012, 12:49 PM

## 2012-06-28 NOTE — Procedures (Signed)
Interventional Radiology Procedure Note  Procedure: Successful CT guided aspiration of left perisplenic / retroperitoneal fluid and gas collection.  15 mL opaque, tan-white fluid aspirated. Complications: None Recommendations: - Sent for gram stain and aerobic, anaerobic and fungal cx  Signed,  Sterling Big, MD Vascular & Interventional Radiologist [facility] Radiology

## 2012-06-28 NOTE — Progress Notes (Signed)
[facility]  Patient Status: Active (receiving services up to time of hospitalization)  [facility] is providing the following services: RN, PT, OT and HHA  If patient discharges after hours, please call [phone].   [name] 06/28/2012, 2:33 PM

## 2012-06-28 NOTE — Progress Notes (Signed)
Pt complaining that pain medication is "not even touching the pain." Rates pain 9/10. MD notified. Will continue to monitor.

## 2012-06-28 NOTE — Progress Notes (Signed)
Pt complaining of constipation. No PRNs on MAR. MD notified, will continue to monitor.

## 2012-06-28 NOTE — H&P (Signed)
[name] is an 62 y.o. female.   Chief Complaint: long hx of pancreatic pseudocysts and pancreatitis abd pain Recent CT shows necrotizing pancreatitis/abscess Scheduled now for abscess aspiration HPI: pancreatitis; CAD; cardiomyopathy; CHF; esoph varices; Cirrhosis; pacemaker; HTN; ESRD  Past Medical History  Diagnosis Date  . Gallstone pancreatitis     Underwent cholecystectomy (and resection of pancreas?) in 2000.  Then has had hx of chronic recurrrent pancreatitis with hx of pseudocyst formation, suspected rupture of pseudocyst w polymicrobial sepsis (April 2013, see below), hx of necrotizing pancreatitis and hx of hemorrhagic pseudocyst ([facility], [place])  . Coronary artery disease     s/p CABG 2008 with multiple PCIs  . Ischemic cardiomyopathy     Cath 04/2012, significant calcifications  . History of nonadherence to medical treatment   . Systolic CHF     16/1096 echo EF 10%  . Esophageal varices   . Cirrhosis     hx of fatty liver per patient, has known ascites with repeated paracenteses at [facility] as of 2013, also +hx of esoph varices with banding in the past  . Polymicrobial sepsis     April 2013 presented septic with blood cx's + for candida glabrata, MRSA and enterbacter cloacae, felt to be due to ruptured pancreatic pseudocyst  . Pacemaker   . ICD (implantable cardiac defibrillator) in place   . Hypertension     "used to have HTN; now I'm low" (05/23/2012)  . NSTEMI (non-ST elevated myocardial infarction)     04/2012; Trop peaked to 1.39; 05/23/2012 pt denies ever having MI  . Pneumonia 02/2012    "first time ever" (05/23/2012)  . Chronic bronchitis 1970's thru 2002    "went away when I stopped smoking in 2002" (05/23/2012)  . Diabetes mellitus     Diet controlled  . History of blood transfusion     "alot of them" (05/23/2012)  . End stage renal disease     T,Th,Sat HD by [facility] (05/23/2012); h/o peritoneal dialysis prior; uses right chest cath for  HD, left arm graft placed by [facility] but not ready for use  . Iron (Fe) deficiency anemia     "severe" (05/23/2012)    Past Surgical History  Procedure Date  . Esophagogastroduodenoscopy 09/15/2011    Procedure: ESOPHAGOGASTRODUODENOSCOPY (EGD);  Surgeon: Theda Belfast, MD;  Location: [facility] ENDOSCOPY;  Service: Endoscopy;  Laterality: N/A;  . Cholecystectomy 2003  . Tonsillectomy and adenoidectomy 1961  . Appendectomy 1973?  [name] Vaginal hysterectomy 1973?  [name] Tubal ligation 1972  . Dilation and curettage of uterus     "bunch from profuse bleeding in the 1970's" (05/23/2012)  . Coronary artery bypass graft 2008    CABG X4  . Coronary angioplasty with stent placement 2008    "1; day after CABG" (05/23/2012)  . Cardiac catheterization     "before 2008 and 3 wk ago" (05/23/2012)  . Insert / replace / remove pacemaker 06/2011    pacemaker ICD  . Cardiac defibrillator placement 06/2011  . Arteriovenous graft placement 03/2012    right antecub  . Insertion of dialysis catheter ~ 10/2011    right chest  . Peritoneal catheter insertion 08/2011  . Peritoneal catheter removal ~ 10/2011    Family History  Problem Relation Age of Onset  . Coronary artery disease Mother   . Hypertension Mother   . Diabetes Mother   . Diabetes Sister   . Anesthesia problems Neg Hx    Social History:  reports that she quit smoking about 12 years ago. Her smoking use included Cigarettes. She has a 10.23 pack-year smoking history. She has never used smokeless tobacco. She reports that she drinks alcohol. She reports that she does not use illicit drugs.  Allergies:  Allergies  Allergen Reactions  . Codeine Itching  . Morphine And Related Shortness Of Breath    SOB when given IV once "to me it was mild; I called out fast for help; never had to intubate" (05/23/2012)  . Ace Inhibitors Other (See Comments)    Unknown reaction but her physician stated that she can't take it (specifically lisinopril)  . Tylenol  (Acetaminophen) Other (See Comments)    Patient states that the doctor told her she has a Fatty Liver.    Medications Prior to Admission  Medication Sig Dispense Refill  . aspirin EC 81 MG EC tablet Take 1 tablet (81 mg total) by mouth daily.  30 tablet  0  . carvedilol (COREG) 6.25 MG tablet Take 1 tablet (6.25 mg total) by mouth 2 (two) times daily with a meal.  60 tablet  3  . darbepoetin (ARANESP) 100 MCG/0.5ML SOLN Inject 100 mcg into the vein See admin instructions. In dialysis      . hydrALAZINE (APRESOLINE) 25 MG tablet Take 1 tablet (25 mg total) by mouth every 8 (eight) hours.  90 tablet  3  . HYDROmorphone (DILAUDID) 2 MG tablet Take 1 mg by mouth every 2 (two) hours as needed. For pain      . HYDROmorphone (DILAUDID) 4 MG tablet Take 1 tablet (4 mg total) by mouth every 4 (four) hours.  84 tablet  0  . isosorbide mononitrate (IMDUR) 30 MG 24 hr tablet Take 1 tablet (30 mg total) by mouth daily.  30 tablet  3  . Magnesium Oxide 420 MG TABS Take 1 tablet by mouth daily.      . methadone (DOLOPHINE) 5 MG tablet Take 1 tablet (5 mg total) by mouth 3 (three) times daily. Takes along with Dilaudid.  42 tablet  0  . multivitamin (RENA-VIT) TABS tablet Take 1 tablet by mouth daily.      . nitroGLYCERIN (NITROSTAT) 0.4 MG SL tablet Place 1 tablet (0.4 mg total) under the tongue every 5 (five) minutes as needed for chest pain (CP or SOB).  30 tablet  1  . Nutritional Supplements (FEEDING SUPPLEMENT, NEPRO CARB STEADY,) LIQD Take 237 mLs by mouth 2 (two) times daily with breakfast and lunch.      . pantoprazole (PROTONIX) 40 MG tablet Take 40 mg by mouth daily.      . pravastatin (PRAVACHOL) 20 MG tablet Take 20 mg by mouth at bedtime.       . ranolazine (RANEXA) 500 MG 12 hr tablet Take 500 mg by mouth daily.       . sevelamer (RENAGEL) 400 MG tablet Take 400 mg by mouth daily. With a meal      . sulfamethoxazole-trimethoprim (SEPTRA DS) 800-160 MG per tablet Take 1 tablet by mouth 2 (two)  times daily.  60 tablet  5    Results for orders placed during the hospital encounter of 06/25/12 (from the past 48 hour(s))  HEPATITIS B SURFACE ANTIGEN     Status: Normal   Collection Time   06/26/12  9:55 AM      Component Value Range Comment   Hepatitis B Surface Ag NEGATIVE  NEGATIVE   TROPONIN I     Status: Normal  Collection Time   06/26/12  2:32 PM      Component Value Range Comment   Troponin I <0.30  <0.30 ng/mL   URINALYSIS, ROUTINE W REFLEX MICROSCOPIC     Status: Abnormal   Collection Time   06/27/12  6:36 AM      Component Value Range Comment   Color, Urine YELLOW  YELLOW    APPearance CLOUDY (*) CLEAR    Specific Gravity, Urine 1.018  1.005 - 1.030    pH 8.0  5.0 - 8.0    Glucose, UA NEGATIVE  NEGATIVE mg/dL    Hgb urine dipstick MODERATE (*) NEGATIVE    Bilirubin Urine SMALL (*) NEGATIVE    Ketones, ur NEGATIVE  NEGATIVE mg/dL    Protein, ur >161 (*) NEGATIVE mg/dL    Urobilinogen, UA 0.2  0.0 - 1.0 mg/dL    Nitrite NEGATIVE  NEGATIVE    Leukocytes, UA SMALL (*) NEGATIVE   URINE MICROSCOPIC-ADD ON     Status: Abnormal   Collection Time   06/27/12  6:36 AM      Component Value Range Comment   Squamous Epithelial / LPF MANY (*) RARE    WBC, UA 7-10  <3 WBC/hpf    RBC / HPF 11-20  <3 RBC/hpf    Bacteria, UA RARE  RARE    Urine-Other LESS THAN 10 mL OF URINE SUBMITTED     RENAL FUNCTION PANEL     Status: Abnormal   Collection Time   06/28/12  5:00 AM      Component Value Range Comment   Sodium 132 (*) 135 - 145 mEq/L    Potassium 4.3  3.5 - 5.1 mEq/L DIALYSIS   Chloride 94 (*) 96 - 112 mEq/L    CO2 25  19 - 32 mEq/L    Glucose, Bld 109 (*) 70 - 99 mg/dL    BUN 36 (*) 6 - 23 mg/dL DIALYSIS   Creatinine, Ser 4.39 (*) 0.50 - 1.10 mg/dL DIALYSIS   Calcium 09.6  8.4 - 10.5 mg/dL    Phosphorus 5.2 (*) 2.3 - 4.6 mg/dL    Albumin 1.9 (*) 3.5 - 5.2 g/dL    GFR calc non Af Amer 10 (*) >90 mL/min    GFR calc Af Amer 11 (*) >90 mL/min   CBC     Status: Abnormal    Collection Time   06/28/12  6:59 AM      Component Value Range Comment   WBC 9.4  4.0 - 10.5 K/uL    RBC 3.25 (*) 3.87 - 5.11 MIL/uL    Hemoglobin 9.1 (*) 12.0 - 15.0 g/dL    HCT 04.5 (*) 40.9 - 46.0 %    MCV 89.5  78.0 - 100.0 fL    MCH 28.0  26.0 - 34.0 pg    MCHC 31.3  30.0 - 36.0 g/dL    RDW 81.1 (*) 91.4 - 15.5 %    Platelets 166  150 - 400 K/uL   MAGNESIUM     Status: Normal   Collection Time   06/28/12  6:59 AM      Component Value Range Comment   Magnesium 2.4  1.5 - 2.5 mg/dL    Ct Abdomen Pelvis W Contrast  06/26/2012  *RADIOLOGY REPORT*  Clinical Data: Worsening abdominal pain.  The patient has a history of complex infected pseudocyst with pancreatic necrosis.  Last admission 05/22/2012.  History of chronic pancreatitis.  CT ABDOMEN AND PELVIS WITH CONTRAST  Technique:  Multidetector CT imaging of the abdomen and pelvis was performed following the standard protocol during bolus administration of intravenous contrast.  Contrast: 80mL OMNIPAQUE IOHEXOL 300 MG/ML  SOLN  Comparison: 05/24/2012  Findings: Small right pleural effusion.  Atelectasis or infiltration in both lung bases.  Diffuse cardiac enlargement. Postoperative changes in the mediastinum.  There is diffuse abdominal and pelvic fluid consistent with ascites, increasing since previous study.  Diffuse subcutaneous soft tissue edema.  The liver demonstrates diffuse central hypo perfusion pattern similar to previous study.  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  Spleen size is normal.  Gallbladder is surgically absent.  Visualization of the pancreas is limited due to obscuration by the surrounding fluid.  There appear to be gas collections throughout the pancreas or peripancreatic tissues with gas and fluid collection again demonstrated at the left upper quadrant extending from the region of the pancreatic tail into the splenic hilum.  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  Gas and fluid  collections appear to be increasing mildly since the previous study. No discrete drainable collection is identified.  Visualization of bowel is limited due to limited opacification.  However, there is suggestion of mild wall thickening consistent with edema in the jejunum.  Contrast material does pass through the colon suggesting no evidence of obstruction. The stomach is decompressed.  The colon is stool-filled.  The kidneys demonstrate small parenchymal cysts.  There is no hydronephrosis.  There is no contrast material shown in the renal collecting systems on the delayed images, suggesting possible delayed appearance but delayed images were obtained only 2-3 minutes after arterial phase in this may be artifactual.  Delayed appearance of contrast material in the renal collecting system cannot suggest renal hypoperfusion or medical renal disease.  Ascites extends into the pelvis.  The bladder is decompressed. Pelvic organs are not visualized.  Scattered diverticula in the sigmoid colon without definite diverticulitis.  The appendix is normal.  Normal alignment of the lumbar vertebrae without compression deformity or expansile destructive change.  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  Ascites.  Abnormal hepatic parenchymal perfusion with diffuse portal venous thrombosis.  Changes have progressed since previous study.  Probable edema in the jejunum which might be related to ascites.  Delayed appearance of contrast material in the renal collecting systems suggest renal parenchymal disease.   Original Report Authenticated By: Burman Nieves, M.D.     Review of Systems  Constitutional: Positive for weight loss. Negative for fever.  Respiratory: Negative for shortness of breath.   Cardiovascular: Negative for chest pain.  Gastrointestinal: Positive for nausea and abdominal pain.  Neurological: Positive for weakness. Negative for headaches.    Blood  pressure 106/70, pulse 78, temperature 98 F (36.7 C), temperature source Oral, resp. rate 21, height 5\' 4"  (1.626 m), weight 121 lb 0.5 oz (54.9 kg), SpO2 96.00%. Physical Exam  Constitutional: She is oriented to person, place, and time.  Cardiovascular: Normal rate and regular rhythm.   Murmur heard. Respiratory: Effort normal and breath sounds normal. She has no wheezes.  GI: Soft.  Musculoskeletal: Normal range of motion.       Moves all 4s; pt in dailysis  Neurological: She is alert and oriented to person, place, and time.  Psychiatric: She has a normal mood and affect. Her behavior is normal. Judgment and thought content normal.     Assessment/Plan ESRD Long hx of gallstone pancreatitis abd pain; CT reveals necrotizing pancreatitis Scheduled now for panc  abscess aspiration Pt aware of procedure benefits and risks and agreeable to proceed Consent signed and in chart  Ranay Ketter A 06/28/2012, 9:43 AM

## 2012-06-29 DIAGNOSIS — R188 Other ascites: Secondary | ICD-10-CM

## 2012-06-29 DIAGNOSIS — J9600 Acute respiratory failure, unspecified whether with hypoxia or hypercapnia: Secondary | ICD-10-CM

## 2012-06-29 DIAGNOSIS — N19 Unspecified kidney failure: Secondary | ICD-10-CM

## 2012-06-29 MED ORDER — DEXTROSE 5 % IV SOLN
2.0000 g | INTRAVENOUS | Status: DC
  Administered 2012-06-30 – 2012-07-02 (×2): 2 g via INTRAVENOUS
  Filled 2012-06-29 (×2): qty 2

## 2012-06-29 MED ORDER — DEXTROSE 5 % IV SOLN
1.0000 g | Freq: Once | INTRAVENOUS | Status: AC
  Administered 2012-06-29: 1 g via INTRAVENOUS
  Filled 2012-06-29: qty 1

## 2012-06-29 MED ORDER — VANCOMYCIN HCL 500 MG IV SOLR
500.0000 mg | INTRAVENOUS | Status: DC
  Administered 2012-06-30 – 2012-07-02 (×2): 500 mg via INTRAVENOUS
  Filled 2012-06-29 (×7): qty 500

## 2012-06-29 MED ORDER — VANCOMYCIN HCL 10 G IV SOLR
1250.0000 mg | Freq: Once | INTRAVENOUS | Status: AC
  Administered 2012-06-29: 1250 mg via INTRAVENOUS
  Filled 2012-06-29: qty 1250

## 2012-06-29 MED ORDER — SODIUM CHLORIDE 0.9 % IV SOLN
100.0000 mg | Freq: Every day | INTRAVENOUS | Status: DC
  Administered 2012-06-29 – 2012-07-06 (×8): 100 mg via INTRAVENOUS
  Filled 2012-06-29 (×10): qty 100

## 2012-06-29 NOTE — Progress Notes (Signed)
ANTIBIOTIC CONSULT NOTE - INITIAL  Pharmacy Consult for Vancomycin + Fortaz Indication: Pancreatic abscess / peritonitis  Allergies  Allergen Reactions  . Codeine Itching  . Morphine And Related Shortness Of Breath    SOB when given IV once "to me it was mild; I called out fast for help; never had to intubate" (05/23/2012)  . Ace Inhibitors Other (See Comments)    Unknown reaction but her physician stated that she can't take it (specifically lisinopril)  . Tylenol (Acetaminophen) Other (See Comments)    Patient states that the doctor told her she has a Fatty Liver.    Patient Measurements: Height: 5\' 4"  (162.6 cm) Weight: 118 lb 9.7 oz (53.8 kg) IBW/kg (Calculated) : 54.7   Vital Signs: Temp: 97.9 F (36.6 C) (01/15 1343) Temp src: Oral (01/15 1343) BP: 127/81 mmHg (01/15 1343) Pulse Rate: 76  (01/15 1343) Intake/Output from previous day: 01/14 0701 - 01/15 0700 In: 1117 [P.O.:780; IV Piggyback:100] Out: 1802  Intake/Output from this shift: Total I/O In: 240 [P.O.:240] Out: -   Labs:  Basename 06/28/12 0659 06/28/12 0500  WBC 9.4 --  HGB 9.1* --  PLT 166 --  LABCREA -- --  CREATININE -- 4.39*   Estimated Creatinine Clearance: 11.3 ml/min (by C-G formula based on Cr of 4.39). No results found for this basename: VANCOTROUGH:2,VANCOPEAK:2,VANCORANDOM:2,GENTTROUGH:2,GENTPEAK:2,GENTRANDOM:2,TOBRATROUGH:2,TOBRAPEAK:2,TOBRARND:2,AMIKACINPEAK:2,AMIKACINTROU:2,AMIKACIN:2, in the last 72 hours   Microbiology: Recent Results (from the past 720 hour(s))  MRSA PCR SCREENING     Status: Normal   Collection Time   06/26/12  3:20 AM      Component Value Range Status Comment   MRSA by PCR NEGATIVE  NEGATIVE Final   URINE CULTURE     Status: Normal   Collection Time   06/27/12  6:36 AM      Component Value Range Status Comment   Specimen Description URINE, RANDOM   Final    Special Requests NONE   Final    Culture  Setup Time 06/27/2012 16:26   Final    Colony Count 40,000  COLONIES/ML   Final    Culture     Final    Value: Multiple bacterial morphotypes present, none predominant. Suggest appropriate recollection if clinically indicated.   Report Status 06/28/2012 FINAL   Final   CULTURE, ROUTINE-ABSCESS     Status: Normal (Preliminary result)   Collection Time   06/28/12  4:26 PM      Component Value Range Status Comment   Specimen Description ABSCESS   Final    Special Requests LEFT RETROPERITONEAL FLUID   Final    Gram Stain     Final    Value: ABUNDANT WBC PRESENT,BOTH PMN AND MONONUCLEAR     FEW YEAST   Culture NO GROWTH   Final    Report Status PENDING   Incomplete   ANAEROBIC CULTURE     Status: Normal (Preliminary result)   Collection Time   06/28/12  4:26 PM      Component Value Range Status Comment   Specimen Description ABSCESS   Final    Special Requests LEFT RETROPERITONEAL FLUID   Final    Gram Stain     Final    Value: ABUNDANT WBC PRESENT,BOTH PMN AND MONONUCLEAR     FEW YEAST   Culture     Final    Value: NO ANAEROBES ISOLATED; CULTURE IN PROGRESS FOR 5 DAYS   Report Status PENDING   Incomplete   FUNGUS CULTURE W SMEAR     Status:  Normal (Preliminary result)   Collection Time   06/28/12  4:26 PM      Component Value Range Status Comment   Specimen Description ABSCESS   Final    Special Requests LEFT RETROPERITONEAL FLUID   Final    Fungal Smear NO YEAST OR FUNGAL ELEMENTS SEEN   Final    Culture CULTURE IN PROGRESS FOR FOUR WEEKS   Final    Report Status PENDING   Incomplete     Medical History: Past Medical History  Diagnosis Date  . Gallstone pancreatitis     Had severe gallstone pancreatitis in May 2003, no surgery, treated medically. Returned in June 2003 for cholecystectomy and cyst gastrostomy for pancreatic pseudocyst.  In March was admitted for hemorrhage into psueodcyst. In April 2013 was treated at [facility] for polymicrobial sepsis (fungal, MRSA and enterobacter) felt to be due to ruptured pseudocyst, treated medically.  .  Coronary artery disease     s/p CABG 2008 with multiple PCIs  . Ischemic cardiomyopathy     Cath 04/2012, significant calcifications  . History of nonadherence to medical treatment   . Systolic CHF     16/1096 echo EF 10%  . Esophageal varices   . Cirrhosis     hx of fatty liver per patient, has known ascites with repeated paracenteses at [facility] as of 2013, also +hx of esoph varices with banding in the past  . Polymicrobial sepsis     April 2013 presented septic with blood cx's + for candida glabrata, MRSA and enterbacter cloacae, felt to be due to ruptured pancreatic pseudocyst  . Pacemaker   . ICD (implantable cardiac defibrillator) in place   . Hypertension     "used to have HTN; now I'm low" (05/23/2012)  . NSTEMI (non-ST elevated myocardial infarction)     04/2012; Trop peaked to 1.39; 05/23/2012 pt denies ever having MI  . Pneumonia 02/2012    "first time ever" (05/23/2012)  . Chronic bronchitis 1970's thru 2002    "went away when I stopped smoking in 2002" (05/23/2012)  . Diabetes mellitus     Diet controlled  . History of blood transfusion     "alot of them" (05/23/2012)  . End stage renal disease     T,Th,Sat HD by [facility] (05/23/2012); h/o peritoneal dialysis prior; uses right chest cath for HD, left arm graft placed by [facility] but not ready for use  . Iron (Fe) deficiency anemia     "severe" (05/23/2012)    Assessment: 62 y.o F with ESRD who presented to the [facility] on 06/25/12 with abdominal pain. The patient has a PMH significant for chronic/necrotizing pancreatitis with hx pancreatic pseudocyst. IR was consulted and and were able to aspirate the left perisplenic / retroperitoneal fluid and gas collection -- which has been sent for culturing. ID has consulted pharmacy to start Vancomycin + Ceftazidime along with Micafungin per MD for empiric coverage while awaiting culture results.   The patient is noted to receive HD on T/Th/Sat -- with her last session  this admission on 06/29/11. Will load the patient this evening and plan to resume post-HD dosing on 1/16.   Goal of Therapy:  Pre-HD Vancomycin level of 15-25 mcg/ml Proper antibiotics for infection/cultures adjusted for renal/hepatic function   Plan:  1. Vancomycin 1250 mg IV x 1 dose this evening followed by 500 mg IV post HD sessions on T/Th/Sat (starting on 1/16) 2. Ceftazidime 1g IV x 1 dose this evening followed by  2g IV post HD sessions on T/Th/Sat (starting on 1/16) 3. Will continue to follow HD schedule/duration, culture results, LOT, and antibiotic de-escalation plans   Georgina Pillion, PharmD, BCPS Clinical Pharmacist Pager: [phone] 06/29/2012 6:29 PM

## 2012-06-29 NOTE — Progress Notes (Signed)
Subjective:  Getting IV Dilaudid for  Abdominal pain/ Tolerated  Hd yesterday/ sp  IR   CT Guided Aspiration  For cx  Objective Vital signs in last 24 hours: Filed Vitals:   06/28/12 1614 06/28/12 1700 06/28/12 2144 06/29/12 0505  BP: 119/80 124/80 109/73 129/80  Pulse: 78 78 74 85  Temp:  98.8 F (37.1 C) 98.5 F (36.9 C) 98.5 F (36.9 C)  TempSrc:  Oral Oral Oral  Resp: 18 18 18 18   Height:      Weight:   53.8 kg (118 lb 9.7 oz)   SpO2: 100% 93% 98% 94%   Weight change: -2.4 kg (-5 lb 4.7 oz)  Intake/Output Summary (Last 24 hours) at 06/29/12 0920 Last data filed at 06/29/12 0300  Gross per 24 hour  Intake   1117 ml  Output   1802 ml  Net   -685 ml   Labs: Basic Metabolic Panel:  Lab 06/28/12 1610 06/26/12 0602 06/25/12 2328 06/25/12 1726  NA 132* 132* -- 130*  K 4.3 6.0* -- 5.4*  CL 94* 96 -- 92*  CO2 25 21 -- 22  GLUCOSE 109* 86 -- 64*  BUN 36* 67* -- 60*  CREATININE 4.39* 6.62* 6.37* --  CALCIUM 10.5 10.7* -- 11.2*  ALB -- -- -- --  PHOS 5.2* -- -- --   Liver Function Tests:  Lab 06/28/12 0500 06/25/12 1726  AST -- 14  ALT -- 5  ALKPHOS -- 118*  BILITOT -- 0.3  PROT -- 7.3  ALBUMIN 1.9* 2.0*    Lab 06/25/12 1846  LIPASE 8*  AMYLASE --   No results found for this basename: AMMONIA:3 in the last 168 hours CBC:  Lab 06/28/12 0659 06/26/12 0602 06/25/12 2328 06/25/12 1726  WBC 9.4 9.5 10.3 --  NEUTROABS -- -- -- 7.2  HGB 9.1* 9.6* 10.2* --  HCT 29.1* 31.1* 33.2* --  MCV 89.5 89.1 88.5 89.0  PLT 166 219 232 --   Cardiac Enzymes:  Lab 06/26/12 1432 06/26/12 0602 06/25/12 2329  CKTOTAL -- -- --  CKMB -- -- --  CKMBINDEX -- -- --  TROPONINI <0.30 <0.30 <0.30   CBG:  Lab 06/25/12 2315  GLUCAP 81    Iron Studies: No results found for this basename: IRON,TIBC,TRANSFERRIN,FERRITIN in the last 72 hours Studies/Results: No results found. Medications:      . aspirin EC  81 mg Oral Daily  . carvedilol  3.125 mg Oral BID WC  . darbepoetin   100 mcg Intravenous Q Sat-HD  . docusate sodium  100 mg Oral QHS  . feeding supplement (NEPRO CARB STEADY)  237 mL Oral BID WC  . hydrALAZINE  25 mg Oral Q8H  . HYDROmorphone  4 mg Oral Q4H  . iron dextran (INFED/DEXFERRUM) infusion  100 mg Intravenous Q T,Th,Sa-HD  . isosorbide mononitrate  30 mg Oral Daily  . magnesium oxide  400 mg Oral Daily  . methadone  5 mg Oral TID  . multivitamin  1 tablet Oral QHS  . pantoprazole  40 mg Oral Daily  . ranolazine  500 mg Oral Daily  . sevelamer  400 mg Oral Q breakfast  . simvastatin  10 mg Oral q1800  . sodium chloride  3 mL Intravenous Q12H  . sodium chloride  3 mL Intravenous Q12H  . sulfamethoxazole-trimethoprim  2 tablet Oral QHS   I  have reviewed scheduled and prn medications.  Physical Exam:  General: Alert, thin chronically BF NAD Appropriate  Heart:  RRR, 2/6 sem lsb, no rub, Left chest AICD  Lungs: CTA bilat.  Abdomen: BS +=, less  tender diffusely / just given Dilaudid/no rebound, some distention. With small Ascites  Extremities: Dialysis Access: 1 +,bipedal edema / Right ij perm cath patent on hd   Out pat. HD Orders= [facility] On TTS, 4 hrs, Edw= 53.5 kg, heparin , EPO 1500 units q hd, no vit d last pth=178.2 (05/05/12) Bath= 3.0 k, 2.5 Ca, 1.0 mg Was on VAncomycin q hd and Fortaz 2gm qhd since 12/14./13 TFS 13% Iron load order X 10 x 06/13/12 ( needing 7 more doses )   Problem/Plan:  1. Abdominal Pain With Chronic Pancreatitis, question pancreatic abcess; s/p IR aspiration 1/14- gram stain showed PMN's and some yeast, cx's pending 2. ESRD - with hyperkalemia 6.0 on admit sec to missed hd X 2. K+ better. With HD/ HD Access Plans= Scheduled at [facility] med center for new R avgg Dr. Hollace Hayward 07/13/12 3. Hx of cirrhosis/chronic ascites with repeated paracenteses at [facility], hx of esoph varices with banding, and hx of known portal vein and splenic vein thrombosis since 2013 by CT 4. Anemia - hgb 9.6 > 9.1 On aranesp 100,with her lower  Value related to her missing epo doses and infection/ needing 7 more doses infed 5. Secondary hyperparathyroidism - Ca Corrected 12.1, no vit d phos 2.4 , 2.25 ca bath on am hd/ change to 2.0 ca bath/ on Renvela Binder breakfast only will hold  6. HTN/volume - some pulmonary edema on admit cxr/ uf admit = with wt to 52.6 post hd (edw=52.5) and 1802 uf yesterday hd cc uf with hdl/  Have Decreased Coreg 6.25 to 3.125mg  as op center recently for bp control on hd/ also on Imdur 30 mg and on Hydralazine 25 mg bid/ taper back if bp drops any on hd 7. CM( 10 % EF)/ CAD NSTEMI 04/2012= has AICD and DNR at op kidney center. Reconfirmed DNR order with patient and have changed order to "DN"  Lenny Pastel, PA-C [facility] [phone] 06/29/2012,9:20 AM  LOS: 4 days   Patient seen, examined and reviewed with Lenny Pastel, P.A., with additions as indicated. Vinson Moselle  MD [facility] [phone] pgr    [phone] cell 06/29/2012, 3:56 PM

## 2012-06-29 NOTE — Progress Notes (Signed)
Pt with a 4 beat run of vtach on monitor. Pt asymptomatic, lying in bed. NAD, skin warm/dry, only cx is pain. MD notified, will continue to monitor.

## 2012-06-29 NOTE — Progress Notes (Signed)
INFECTIOUS DISEASE PROGRESS NOTE  ID: [name] is a 62 y.o. female with   Active Problems:  Pancreatitis, chronic  ESRD on hemodialysis  Pancreatic necrosis  Subjective:  without complaints  Abtx:  Anti-infectives     Start     Dose/Rate Route Frequency Ordered Stop   06/26/12 0100  sulfamethoxazole-trimethoprim (BACTRIM DS) 800-160 MG per tablet 2 tablet       2 tablet Oral Daily at bedtime 06/26/12 0050     06/25/12 2315   sulfamethoxazole-trimethoprim (BACTRIM DS,SEPTRA DS) 800-160 MG per tablet 1 tablet  Status:  Discontinued        1 tablet Oral 2 times daily 06/25/12 2301 06/26/12 0048          Medications:  Scheduled:   . aspirin EC  81 mg Oral Daily  . carvedilol  3.125 mg Oral BID WC  . darbepoetin  100 mcg Intravenous Q Sat-HD  . docusate sodium  100 mg Oral QHS  . feeding supplement (NEPRO CARB STEADY)  237 mL Oral BID WC  . hydrALAZINE  25 mg Oral Q8H  . HYDROmorphone  4 mg Oral Q4H  . iron dextran (INFED/DEXFERRUM) infusion  100 mg Intravenous Q T,Th,Sa-HD  . isosorbide mononitrate  30 mg Oral Daily  . magnesium oxide  400 mg Oral Daily  . methadone  5 mg Oral TID  . multivitamin  1 tablet Oral QHS  . pantoprazole  40 mg Oral Daily  . ranolazine  500 mg Oral Daily  . sevelamer  400 mg Oral Q breakfast  . simvastatin  10 mg Oral q1800  . sodium chloride  3 mL Intravenous Q12H  . sodium chloride  3 mL Intravenous Q12H  . sulfamethoxazole-trimethoprim  2 tablet Oral QHS    Objective: Vital signs in last 24 hours: Temp:  [97.6 F (36.4 C)-98.5 F (36.9 C)] 97.9 F (36.6 C) (01/15 1343) Pulse Rate:  [72-85] 76  (01/15 1343) Resp:  [18] 18  (01/15 1343) BP: (109-129)/(73-82) 127/81 mmHg (01/15 1343) SpO2:  [94 %-98 %] 97 % (01/15 1343) Weight:  [53.8 kg (118 lb 9.7 oz)] 53.8 kg (118 lb 9.7 oz) (01/14 2144)   General appearance: alert, cooperative and no distress Resp: clear to auscultation bilaterally Cardio: regular rate and rhythm  and systolic murmur: early systolic 2/6, crescendo at 2nd left intercostal space, at 2nd right intercostal space GI: normal findings: bowel sounds normal and abnormal findings:  distended and tender, mild gaurding  Lab Results  Basename 06/28/12 0659 06/28/12 0500  WBC 9.4 --  HGB 9.1* --  HCT 29.1* --  NA -- 132*  K -- 4.3  CL -- 94*  CO2 -- 25  BUN -- 36*  CREATININE -- 4.39*  GLU -- --   Liver Panel  Basename 06/28/12 0500  PROT --  ALBUMIN 1.9*  AST --  ALT --  ALKPHOS --  BILITOT --  BILIDIR --  IBILI --   Sedimentation Rate No results found for this basename: ESRSEDRATE in the last 72 hours C-Reactive Protein No results found for this basename: CRP:2 in the last 72 hours  Microbiology: Recent Results (from the past 240 hour(s))  MRSA PCR SCREENING     Status: Normal   Collection Time   06/26/12  3:20 AM      Component Value Range Status Comment   MRSA by PCR NEGATIVE  NEGATIVE Final   URINE CULTURE     Status: Normal   Collection Time  06/27/12  6:36 AM      Component Value Range Status Comment   Specimen Description URINE, RANDOM   Final    Special Requests NONE   Final    Culture  Setup Time 06/27/2012 16:26   Final    Colony Count 40,000 COLONIES/ML   Final    Culture     Final    Value: Multiple bacterial morphotypes present, none predominant. Suggest appropriate recollection if clinically indicated.   Report Status 06/28/2012 FINAL   Final   CULTURE, ROUTINE-ABSCESS     Status: Normal (Preliminary result)   Collection Time   06/28/12  4:26 PM      Component Value Range Status Comment   Specimen Description ABSCESS   Final    Special Requests LEFT RETROPERITONEAL FLUID   Final    Gram Stain     Final    Value: ABUNDANT WBC PRESENT,BOTH PMN AND MONONUCLEAR     FEW YEAST   Culture NO GROWTH   Final    Report Status PENDING   Incomplete   ANAEROBIC CULTURE     Status: Normal (Preliminary result)   Collection Time   06/28/12  4:26 PM      Component  Value Range Status Comment   Specimen Description ABSCESS   Final    Special Requests LEFT RETROPERITONEAL FLUID   Final    Gram Stain     Final    Value: ABUNDANT WBC PRESENT,BOTH PMN AND MONONUCLEAR     FEW YEAST   Culture     Final    Value: NO ANAEROBES ISOLATED; CULTURE IN PROGRESS FOR 5 DAYS   Report Status PENDING   Incomplete   FUNGUS CULTURE W SMEAR     Status: Normal (Preliminary result)   Collection Time   06/28/12  4:26 PM      Component Value Range Status Comment   Specimen Description ABSCESS   Final    Special Requests LEFT RETROPERITONEAL FLUID   Final    Fungal Smear NO YEAST OR FUNGAL ELEMENTS SEEN   Final    Culture CULTURE IN PROGRESS FOR FOUR WEEKS   Final    Report Status PENDING   Incomplete     Studies/Results: No results found.   Assessment/Plan: Pancreatic Abscess ESRD Cirrhosis  Would start her on mycafungin, ceftaz and vanco Stop bactrim Await Cx Comment- will try to avoid ampho, if her yeast Cx is again C parapsilosis may be forced to use. Will send her isolate for sensi testing regardless.   Total days of antibiotics 4 (bactrim)         Johny Sax Infectious Diseases [phone] 06/29/2012, 5:39 PM   LOS: 4 days

## 2012-06-29 NOTE — Progress Notes (Signed)
Physical Therapy Evaluation Patient Details Name: [name] MRN: [id number] DOB: 1950/09/01 Today's Date: 06/29/2012 Time: 1203-1227 PT Time Calculation (min): 24 min  PT Assessment / Plan / Recommendation Clinical Impression  62 yo female admitted with abdominal pain, pancreatitis, presents to PT with decr functional mobility, decr activity tol; will benefit form PT to maximize independeces and safety with mobility and enable safe dc home    PT Assessment  Patient needs continued PT services    Follow Up Recommendations  Home health PT    Does the patient have the potential to tolerate intense rehabilitation      Barriers to Discharge Decreased caregiver support      Equipment Recommendations  None recommended by PT    Recommendations for Other Services     Frequency Min 3X/week    Precautions / Restrictions Precautions Precautions: Fall Precaution Comments: fall risk is minimized with RW use   Pertinent Vitals/Pain no apparent distress       Mobility  Bed Mobility Bed Mobility: Supine to Sit;Sitting - Scoot to Edge of Bed Supine to Sit: 5: Supervision Sitting - Scoot to Edge of Bed: 5: Supervision Details for Bed Mobility Assistance: Smooth transition Transfers Transfers: Sit to Stand;Stand to Sit Sit to Stand: 4: Min guard;From bed Stand to Sit: 4: Min guard;To chair/3-in-1 Details for Transfer Assistance: Cues for safety, hand placement; uncontrolled descent to chair Ambulation/Gait Ambulation/Gait Assistance: 4: Min guard Ambulation Distance (Feet): 120 Feet Assistive device: Rolling walker Ambulation/Gait Assistance Details: tending to fatigue with decr steadiness with incr distance    Shoulder Instructions     Exercises     PT Diagnosis: Difficulty walking;Generalized weakness  PT Problem List: Decreased strength;Decreased activity tolerance;Decreased balance;Decreased mobility;Decreased knowledge of use of DME PT Treatment Interventions: DME  instruction;Gait training;Functional mobility training;Therapeutic activities;Therapeutic exercise;Balance training;Patient/family education   PT Goals Acute Rehab PT Goals PT Goal Formulation: With patient Time For Goal Achievement: 07/13/12 Potential to Achieve Goals: Good Pt will go Supine/Side to Sit: with modified independence PT Goal: Supine/Side to Sit - Progress: Goal set today Pt will go Sit to Supine/Side: with modified independence PT Goal: Sit to Supine/Side - Progress: Goal set today Pt will go Sit to Stand: with modified independence PT Goal: Sit to Stand - Progress: Goal set today Pt will go Stand to Sit: with modified independence PT Goal: Stand to Sit - Progress: Goal set today Pt will Ambulate: >150 feet;with modified independence;with rolling walker PT Goal: Ambulate - Progress: Goal set today  Visit Information  Last PT Received On: 06/29/12 Assistance Needed: +1    Subjective Data  Subjective: Agreeable to amb Patient Stated Goal: get better and go home   Prior Functioning  Home Living Lives With: Alone Available Help at Discharge: Family;Friend(s);Home health Type of Home: Apartment Home Access: Level entry Home Layout: One level Home Adaptive Equipment: Walker - rolling, 4 wheeled;Bedside commode/3-in-1     ** Pt reports HHPT was working to obtain a rollator RW for pt; I agree with this as pt's balance tends to deteriorate with fatigue **  Prior Function Level of Independence: Independent with assistive device(s) Communication Communication: No difficulties    Cognition  Overall Cognitive Status: Appears within functional limits for tasks assessed/performed Arousal/Alertness: Awake/alert Orientation Level: Appears intact for tasks assessed Behavior During Session: [facility] for tasks performed    Extremity/Trunk Assessment Right Upper Extremity Assessment RUE ROM/Strength/Tone: [facility] for tasks assessed Left Upper Extremity Assessment LUE  ROM/Strength/Tone: [facility] for tasks assessed Right Lower  Extremity Assessment RLE ROM/Strength/Tone: Deficits RLE ROM/Strength/Tone Deficits: Generally weak, with dependence on UE support for sit to and from stand Left Lower Extremity Assessment LLE ROM/Strength/Tone: Deficits LLE ROM/Strength/Tone Deficits: Generally weak   Balance    End of Session PT - End of Session Equipment Utilized During Treatment: Gait belt Activity Tolerance: Patient tolerated treatment well Patient left: in chair;with call bell/phone within reach (ready to eat lunch) Nurse Communication: Mobility status  GP     Olen Pel [place], Nenzel [phone]  06/29/2012, 4:24 PM

## 2012-06-29 NOTE — Progress Notes (Signed)
[facility] PROGRESS NOTE  [name] ZOX:[id number] DOB: [birth year] DOA: 06/25/2012 PCP: Jyl Heinz, MD  Assessment/Plan:  1-Abdominal pain: Patient with history of complex infected pseudoocyst with pancreatic necrosis last admission 05-22-2012. She was treated with IV antibiotics ceftazidime, and Vancomycin until 06/07/12. Suppose to have CT scan 06-10-2012. She has history of chronic pancreatitis. She presents with worsening abdominal pain. 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 Ascites. Abnormal hepatic parenchymal  perfusion with diffuse portal venous thrombosis. Changes have progressed since previous study.  -Surgery consulted. Patient refuse procedure and patient is not candidate for surgery -ESRD: Continue with Dialysis. Correction of electrolytes during dialysis. No complaint with dialysis, missed last 2 session prior to admission.  3-Ischemic cardiomyopathy status Medtronic pacemaker/AICD with EF 10% in 04/2012: Continue with Zocor, IMDUR, Coreg, aspirin.  4-Hyperkalemia: Correction with dialysis.  5-Weakness: PT, OT . Neuro exam non focal.    Code Status: full. Family Communication: Care discussed with patient.  Disposition Plan: To be determine.    Consultants:  Nephrologist.   Procedures:  None.  Antibiotics:  Bactrim   HPI/Subjective: Patient still complaining of abdominal pain, no worse.   Objective: Filed Vitals:   06/28/12 2144 06/29/12 0505 06/29/12 1105 06/29/12 1343  BP: 109/73 129/80 126/82 127/81  Pulse: 74 85 72 76  Temp: 98.5 F (36.9 C) 98.5 F (36.9 C) 97.6 F (36.4 C) 97.9 F (36.6 C)  TempSrc: Oral Oral Oral Oral  Resp: 18 18 18 18   Height:      Weight: 53.8 kg (118 lb 9.7 oz)     SpO2: 98% 94% 96% 97%    Intake/Output Summary (Last 24 hours) at 06/29/12 1953 Last data filed at 06/29/12 1300  Gross per 24 hour  Intake    540 ml  Output      0 ml  Net     540 ml   Filed Weights   06/28/12 0643 06/28/12 1112 06/28/12 2144  Weight: 54.9 kg (121 lb 0.5 oz) 52.6 kg (115 lb 15.4 oz) 53.8 kg (118 lb 9.7 oz)    Exam:   General:  No distress.  Cardiovascular: S 1, S 2, RRR.   Respiratory: CTA.  Abdomen: BS present, soft, NR, NG. Tenderness palpation.   Data Reviewed: Basic Metabolic Panel:  Lab 06/28/12 8119 06/28/12 0500 06/26/12 0602 06/25/12 2328 06/25/12 1726  NA -- 132* 132* -- 130*  K -- 4.3 6.0* -- 5.4*  CL -- 94* 96 -- 92*  CO2 -- 25 21 -- 22  GLUCOSE -- 109* 86 -- 64*  BUN -- 36* 67* -- 60*  CREATININE -- 4.39* 6.62* 6.37* 6.17*  CALCIUM -- 10.5 10.7* -- 11.2*  MG 2.4 -- -- -- --  PHOS -- 5.2* -- -- --   Liver Function Tests:  Lab 06/28/12 0500 06/25/12 1726  AST -- 14  ALT -- 5  ALKPHOS -- 118*  BILITOT -- 0.3  PROT -- 7.3  ALBUMIN 1.9* 2.0*    Lab 06/25/12 1846  LIPASE 8*  AMYLASE --   No results found for this basename: AMMONIA:5 in the last 168 hours CBC:  Lab 06/28/12 0659 06/26/12 0602 06/25/12 2328 06/25/12 1726  WBC 9.4 9.5 10.3 9.7  NEUTROABS -- -- -- 7.2  HGB 9.1* 9.6* 10.2* 9.5*  HCT 29.1* 31.1* 33.2* 30.7*  MCV 89.5 89.1 88.5 89.0  PLT 166 219 232 205   Cardiac Enzymes:  Lab 06/26/12 1432 06/26/12 0602 06/25/12 2329  CKTOTAL -- -- --  CKMB -- -- --  CKMBINDEX -- -- --  TROPONINI <0.30 <0.30 <0.30   BNP (last 3 results)  Basename 06/02/12 1944 05/08/12 0515 04/21/12 1419  PROBNP >70000.0* >70000.0* >70000.0*   CBG:  Lab 06/25/12 2315  GLUCAP 81    Recent Results (from the past 240 hour(s))  MRSA PCR SCREENING     Status: Normal   Collection Time   06/26/12  3:20 AM      Component Value Range Status Comment   MRSA by PCR NEGATIVE  NEGATIVE Final   URINE CULTURE     Status: Normal   Collection Time   06/27/12  6:36 AM      Component Value Range Status Comment   Specimen Description URINE, RANDOM   Final    Special Requests NONE   Final    Culture  Setup Time 06/27/2012  16:26   Final    Colony Count 40,000 COLONIES/ML   Final    Culture     Final    Value: Multiple bacterial morphotypes present, none predominant. Suggest appropriate recollection if clinically indicated.   Report Status 06/28/2012 FINAL   Final   CULTURE, ROUTINE-ABSCESS     Status: Normal (Preliminary result)   Collection Time   06/28/12  4:26 PM      Component Value Range Status Comment   Specimen Description ABSCESS   Final    Special Requests LEFT RETROPERITONEAL FLUID   Final    Gram Stain     Final    Value: ABUNDANT WBC PRESENT,BOTH PMN AND MONONUCLEAR     FEW YEAST   Culture NO GROWTH   Final    Report Status PENDING   Incomplete   ANAEROBIC CULTURE     Status: Normal (Preliminary result)   Collection Time   06/28/12  4:26 PM      Component Value Range Status Comment   Specimen Description ABSCESS   Final    Special Requests LEFT RETROPERITONEAL FLUID   Final    Gram Stain     Final    Value: ABUNDANT WBC PRESENT,BOTH PMN AND MONONUCLEAR     FEW YEAST   Culture     Final    Value: NO ANAEROBES ISOLATED; CULTURE IN PROGRESS FOR 5 DAYS   Report Status PENDING   Incomplete   FUNGUS CULTURE W SMEAR     Status: Normal (Preliminary result)   Collection Time   06/28/12  4:26 PM      Component Value Range Status Comment   Specimen Description ABSCESS   Final    Special Requests LEFT RETROPERITONEAL FLUID   Final    Fungal Smear NO YEAST OR FUNGAL ELEMENTS SEEN   Final    Culture CULTURE IN PROGRESS FOR FOUR WEEKS   Final    Report Status PENDING   Incomplete      Studies: No results found.  Scheduled Meds:    . aspirin EC  81 mg Oral Daily  . carvedilol  3.125 mg Oral BID WC  . cefTAZidime (FORTAZ)  IV  1 g Intravenous Once  . cefTAZidime (FORTAZ)  IV  2 g Intravenous Q T,Th,Sat-1800  . darbepoetin  100 mcg Intravenous Q Sat-HD  . docusate sodium  100 mg Oral QHS  . feeding supplement (NEPRO CARB STEADY)  237 mL Oral BID WC  . hydrALAZINE  25 mg Oral Q8H  .  HYDROmorphone  4 mg Oral Q4H  . iron dextran (INFED/DEXFERRUM) infusion  100 mg Intravenous Q T,Th,Sa-HD  . isosorbide mononitrate  30 mg Oral Daily  . magnesium oxide  400 mg Oral Daily  . methadone  5 mg Oral TID  . micafungin [facility]) IV  100 mg Intravenous Daily  . multivitamin  1 tablet Oral QHS  . pantoprazole  40 mg Oral Daily  . ranolazine  500 mg Oral Daily  . sevelamer  400 mg Oral Q breakfast  . simvastatin  10 mg Oral q1800  . sodium chloride  3 mL Intravenous Q12H  . sodium chloride  3 mL Intravenous Q12H  . vancomycin  1,250 mg Intravenous Once  . vancomycin  500 mg Intravenous Q T,Th,Sa-HD   Continuous Infusions:   Active Problems:  Pancreatitis, chronic  ESRD on hemodialysis  Pancreatic necrosis       Denzal Meir  [facility] Pager [phone]. If 8PM-8AM, please contact night-coverage at [email or web address], password [email or web address] 06/29/2012, 7:53 PM  LOS: 4 days

## 2012-06-30 DIAGNOSIS — K8689 Other specified diseases of pancreas: Secondary | ICD-10-CM

## 2012-06-30 DIAGNOSIS — I81 Portal vein thrombosis: Secondary | ICD-10-CM

## 2012-06-30 DIAGNOSIS — K862 Cyst of pancreas: Principal | ICD-10-CM

## 2012-06-30 LAB — RENAL FUNCTION PANEL
Calcium: 10.9 mg/dL — ABNORMAL HIGH (ref 8.4–10.5)
GFR calc Af Amer: 15 mL/min — ABNORMAL LOW (ref >90)
Glucose, Bld: 138 mg/dL — ABNORMAL HIGH (ref 70–99)
Phosphorus: 4.4 mg/dL (ref 2.3–4.6)
Sodium: 130 mEq/L — ABNORMAL LOW (ref 135–145)

## 2012-06-30 LAB — CBC
Hemoglobin: 8.6 g/dL — ABNORMAL LOW (ref 12.0–15.0)
MCH: 28.1 pg (ref 26.0–34.0)
MCHC: 31.6 g/dL (ref 30.0–36.0)
RDW: 17.8 % — ABNORMAL HIGH (ref 11.5–15.5)

## 2012-06-30 MED ORDER — PENTAFLUOROPROP-TETRAFLUOROETH EX AERO: 1.0000 "application " | INHALATION_SPRAY | CUTANEOUS | Status: DC | PRN

## 2012-06-30 MED ORDER — SODIUM CHLORIDE 0.9 % IV SOLN: 100.0000 mL | INTRAVENOUS | Status: DC | PRN

## 2012-06-30 MED ORDER — HEPARIN SODIUM (PORCINE) 1000 UNIT/ML DIALYSIS
1600.0000 [IU] | INTRAMUSCULAR | Status: DC | PRN
  Administered 2012-06-30: 1600 [IU] via INTRAVENOUS_CENTRAL

## 2012-06-30 MED ORDER — HYDROMORPHONE HCL PF 1 MG/ML IJ SOLN
INTRAMUSCULAR | Status: AC
  Administered 2012-06-30: 2 mg via INTRAVENOUS
  Filled 2012-06-30: qty 2

## 2012-06-30 MED ORDER — ALTEPLASE 2 MG IJ SOLR: 2.0000 mg | Freq: Once | INTRAMUSCULAR | Status: DC | PRN

## 2012-06-30 MED ORDER — ONDANSETRON HCL 4 MG/2ML IJ SOLN
INTRAMUSCULAR | Status: AC
  Administered 2012-06-30: 4 mg
  Filled 2012-06-30: qty 2

## 2012-06-30 MED ORDER — HYDROMORPHONE HCL PF 1 MG/ML IJ SOLN
1.0000 mg | Freq: Once | INTRAMUSCULAR | Status: AC
  Administered 2012-06-30: 1 mg via INTRAVENOUS
  Filled 2012-06-30: qty 1

## 2012-06-30 MED ORDER — HEPARIN SODIUM (PORCINE) 1000 UNIT/ML DIALYSIS: 1000.0000 [IU] | INTRAMUSCULAR | Status: DC | PRN

## 2012-06-30 MED ORDER — NEPRO/CARBSTEADY PO LIQD: 237.0000 mL | ORAL | Status: DC | PRN

## 2012-06-30 MED ORDER — METHADONE HCL 5 MG PO TABS
10.0000 mg | ORAL_TABLET | Freq: Three times a day (TID) | ORAL | Status: DC
  Administered 2012-06-30 – 2012-07-07 (×21): 10 mg via ORAL
  Filled 2012-06-30 (×21): qty 2

## 2012-06-30 MED ORDER — LIDOCAINE-PRILOCAINE 2.5-2.5 % EX CREA: 1.0000 "application " | TOPICAL_CREAM | CUTANEOUS | Status: DC | PRN

## 2012-06-30 MED ORDER — LIDOCAINE HCL (PF) 1 % IJ SOLN: 5.0000 mL | INTRAMUSCULAR | Status: DC | PRN

## 2012-06-30 NOTE — Progress Notes (Signed)
Subjective:  stillhaving abd pains  Objective Vital signs in last 24 hours: Filed Vitals:   06/29/12 1343 06/29/12 2134 06/30/12 0532 06/30/12 0937  BP: 127/81 124/76 134/81 132/90  Pulse: 76 74 79 90  Temp: 97.9 F (36.6 C) 98 F (36.7 C) 97.6 F (36.4 C) 98.1 F (36.7 C)  TempSrc: Oral Oral Oral   Resp: 18 18 16 18   Height:      Weight:  55.339 kg (122 lb)    SpO2: 97% 97% 96% 98%   Weight change: 2.739 kg (6 lb 0.6 oz)  Intake/Output Summary (Last 24 hours) at 06/30/12 1019 Last data filed at 06/30/12 0938  Gross per 24 hour  Intake    480 ml  Output      0 ml  Net    480 ml   Labs: Basic Metabolic Panel:  Lab 06/28/12 9604 06/26/12 0602 06/25/12 2328 06/25/12 1726  NA 132* 132* -- 130*  K 4.3 6.0* -- 5.4*  CL 94* 96 -- 92*  CO2 25 21 -- 22  GLUCOSE 109* 86 -- 64*  BUN 36* 67* -- 60*  CREATININE 4.39* 6.62* 6.37* --  CALCIUM 10.5 10.7* -- 11.2*  ALB -- -- -- --  PHOS 5.2* -- -- --   Liver Function Tests:  Lab 06/28/12 0500 06/25/12 1726  AST -- 14  ALT -- 5  ALKPHOS -- 118*  BILITOT -- 0.3  PROT -- 7.3  ALBUMIN 1.9* 2.0*    Lab 06/25/12 1846  LIPASE 8*  AMYLASE --   No results found for this basename: AMMONIA:3 in the last 168 hours CBC:  Lab 06/28/12 0659 06/26/12 0602 06/25/12 2328 06/25/12 1726  WBC 9.4 9.5 10.3 --  NEUTROABS -- -- -- 7.2  HGB 9.1* 9.6* 10.2* --  HCT 29.1* 31.1* 33.2* --  MCV 89.5 89.1 88.5 89.0  PLT 166 219 232 --   Cardiac Enzymes:  Lab 06/26/12 1432 06/26/12 0602 06/25/12 2329  CKTOTAL -- -- --  CKMB -- -- --  CKMBINDEX -- -- --  TROPONINI <0.30 <0.30 <0.30   CBG:  Lab 06/25/12 2315  GLUCAP 81    Iron Studies: No results found for this basename: IRON,TIBC,TRANSFERRIN,FERRITIN in the last 72 hours Studies/Results: No results found. Medications:      . aspirin EC  81 mg Oral Daily  . carvedilol  3.125 mg Oral BID WC  . cefTAZidime (FORTAZ)  IV  2 g Intravenous Q T,Th,Sat-1800  . darbepoetin  100 mcg  Intravenous Q Sat-HD  . docusate sodium  100 mg Oral QHS  . feeding supplement (NEPRO CARB STEADY)  237 mL Oral BID WC  . hydrALAZINE  25 mg Oral Q8H  . HYDROmorphone  4 mg Oral Q4H  . iron dextran (INFED/DEXFERRUM) infusion  100 mg Intravenous Q T,Th,Sa-HD  . isosorbide mononitrate  30 mg Oral Daily  . magnesium oxide  400 mg Oral Daily  . methadone  5 mg Oral TID  . micafungin [facility]) IV  100 mg Intravenous Daily  . multivitamin  1 tablet Oral QHS  . pantoprazole  40 mg Oral Daily  . ranolazine  500 mg Oral Daily  . sevelamer  400 mg Oral Q breakfast  . simvastatin  10 mg Oral q1800  . sodium chloride  3 mL Intravenous Q12H  . sodium chloride  3 mL Intravenous Q12H  . vancomycin  500 mg Intravenous Q T,Th,Sa-HD   I  have reviewed scheduled and prn medications.  Physical Exam:  General: Alert, thin chronically BF NAD Appropriate  Heart: RRR, 2/6 sem lsb, no rub, Left chest AICD  Lungs: CTA bilat.  Abdomen: BS +=, less  tender diffusely / just given Dilaudid/no rebound, some distention. With small Ascites  Extremities: Dialysis Access: 1 +,bipedal edema / Right ij perm cath patent on hd   Outpatient HD- [facility] On TTS, 4 hrs, Edw= 53.5 kg, heparin , EPO 1500 units q hd, no vit d last pth=178.2 (05/05/12) Bath= 3.0 k, 2.5 Ca, 1.0 mg Was on VAncomycin q hd and Fortaz 2gm qhd since 12/14./13 TFS 13% Iron load order X 10 x 06/13/12 ( needing 7 more doses )   Problem/Plan:  1. Abdominal Pain With Chronic Pancreatitis, question pancreatic abcess; s/p IR aspiration 1/14- gram stain showing yeast, cx pend, started on micafungin/vanc/fortaz yesterday 1/15 2. ESRD, usual HD tts- HD today 3. HD Access Plans= Scheduled at [facility] med center for new R avgg Dr. Hollace Hayward 07/13/12 4. Hx of cirrhosis/chronic ascites, hx of esoph varices with banding, and hx of known portal vein and splenic vein thrombosis 5. Anemia - hgb 9.6 > 9.1 On aranesp 100,with her lower Value related to her missing  epo doses and infection/ needing 7 more doses infed 6. Secondary hyperparathyroidism - Ca Corrected 12.1, no vit d phos 2.4 , 2.25 ca bath on am hd/ change to 2.0 ca bath/ on Renvela Binder breakfast only will hold  7. HTN/volume - 2-3 kg up by weights, UF 3 kg as tolerated today with HD.  Have decreased Coreg 6.25 to 3.125mg  as op center recently for bp control on hd/ also on Imdur 30 mg and on Hydralazine 25 mg bid/ taper back if bp drops any on hd 8. CM( 10 % EF)/ CAD NSTEMI 04/2012= has AICD and DNR at op kidney center. Reconfirmed DNR order with patient and have changed order to "DN"  Vinson Moselle  MD [facility] [phone] pgr    [phone] cell 06/30/2012, 10:19 AM

## 2012-06-30 NOTE — Progress Notes (Signed)
[facility] PROGRESS NOTE  [name] WUJ:[id number] DOB: 03-01-1951 DOA: 06/25/2012 PCP: Jyl Heinz, MD  Assessment/Plan:  1-Abdominal pain: Patient with history of complex infected pseudoocyst with pancreatic necrosis last admission 05-22-2012. She was treated with IV antibiotics ceftazidime, and Vancomycin until 06/07/12. Suppose to have CT scan 06-10-2012. She has history of chronic pancreatitis. She presents with worsening abdominal pain. 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 Ascites. Abnormal hepatic parenchymal perfusion with diffuse portal venous thrombosis. Changes have progressed since previous study.  -Surgery consulted. 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 ESRD: Continue with Dialysis. Correction of electrolytes during dialysis. No complaint with dialysis, missed last 2 session prior to admission.  3-Ischemic cardiomyopathy status Medtronic pacemaker/AICD with EF 10% in 04/2012: Continue with Zocor, IMDUR, Coreg, aspirin.  4-Hyperkalemia: Correction with dialysis.  5-Weakness: PT, OT . Neuro exam non focal.    Code Status: full. Family Communication: Care discussed with patient.  Disposition Plan: home    Consultants:  Nephrologist.   ID  Procedures:  None.  Antibiotics:  Vanc 1/15  Elita Quick 1/15   Micafungin  1/15  HPI/Subjective: Patient still complaining of abdominal pain 10/10 . She also feels bloated and distended    Objective: Filed Vitals:   06/30/12 1350 06/30/12 1420 06/30/12 1450 06/30/12 1518  BP: 139/90 134/84 142/91   Pulse: 91 89 82 85  Temp:      TempSrc:      Resp: 20 19 23 18   Height:      Weight:      SpO2:        Patient Vitals for the past 24 hrs:  BP Temp Temp src Pulse Resp SpO2 Weight  06/30/12 1518 - - - 85  18  - -  06/30/12 1450 142/91 mmHg - - 82  23  - -  06/30/12 1420 134/84 mmHg - - 89  19  - -  06/30/12 1350 139/90 mmHg - - 91  20  - -  06/30/12 1330 138/87 mmHg 97.9 F (36.6 C) Oral 87  16  97 % 57.6 kg (126 lb 15.8 oz)  06/30/12 0937 132/90 mmHg 98.1 F (36.7 C) - 90  18  98 % -  06/30/12 0532 134/81 mmHg 97.6 F (36.4 C) Oral 79  16  96 % -  06/29/12 2134 124/76 mmHg 98 F (36.7 C) Oral 74  18  97 % 55.339 kg (122 lb)     Intake/Output Summary (Last 24 hours) at 06/30/12 1606 Last data filed at 06/30/12 0938  Gross per 24 hour  Intake    240 ml  Output      0 ml  Net    240 ml   Filed Weights   06/28/12 2144 06/29/12 2134 06/30/12 1330  Weight: 53.8 kg (118 lb 9.7 oz) 55.339 kg (122 lb) 57.6 kg (126 lb 15.8 oz)    Exam:   General:  No distress.  Cardiovascular: S 1, S 2, RRR.   Respiratory: CTA.  Abdomen: BS present, soft, NR, NG. Tenderness palpation.   Data Reviewed: Basic Metabolic Panel:  Lab 06/30/12 9562 06/28/12 0659 06/28/12 0500 06/26/12 0602 06/25/12 2328 06/25/12 1726  NA 130* -- 132* 132* -- 130*  K 3.5 -- 4.3 6.0* -- 5.4*  CL 93* -- 94* 96 -- 92*  CO2 27 -- 25 21 -- 22  GLUCOSE 138* -- 109* 86 -- 64*  BUN 32* -- 36* 67* -- 60*  CREATININE 3.51* -- 4.39* 6.62* 6.37* 6.17*  CALCIUM 10.9* -- 10.5 10.7* -- 11.2*  MG -- 2.4 -- -- -- --  PHOS 4.4 -- 5.2* -- -- --   Liver Function Tests:  Lab 06/30/12 1430 06/28/12 0500 06/25/12 1726  AST -- -- 14  ALT -- -- 5  ALKPHOS -- -- 118*  BILITOT -- -- 0.3  PROT -- -- 7.3  ALBUMIN 1.8* 1.9* 2.0*    Lab 06/25/12 1846  LIPASE 8*  AMYLASE --   No results found for this basename: AMMONIA:5 in the last 168 hours CBC:  Lab 06/30/12 1430 06/28/12 0659 06/26/12 0602 06/25/12 2328 06/25/12 1726  WBC 11.2* 9.4 9.5 10.3 9.7  NEUTROABS -- -- -- -- 7.2  HGB 8.6* 9.1* 9.6* 10.2* 9.5*  HCT  27.2* 29.1* 31.1* 33.2* 30.7*  MCV 88.9 89.5 89.1 88.5 89.0  PLT 159 166 219 232 205   Cardiac Enzymes:  Lab 06/26/12 1432 06/26/12 0602 06/25/12 2329  CKTOTAL -- -- --  CKMB -- -- --  CKMBINDEX -- -- --  TROPONINI <0.30 <0.30 <0.30   BNP (last 3 results)  Basename 06/02/12 1944 05/08/12 0515 04/21/12 1419  PROBNP >70000.0* >70000.0* >70000.0*   CBG:  Lab 06/25/12 2315  GLUCAP 81    Recent Results (from the past 240 hour(s))  MRSA PCR SCREENING     Status: Normal   Collection Time   06/26/12  3:20 AM      Component Value Range Status Comment   MRSA by PCR NEGATIVE  NEGATIVE Final   URINE CULTURE     Status: Normal   Collection Time   06/27/12  6:36 AM      Component Value Range Status Comment   Specimen Description URINE, RANDOM   Final    Special Requests NONE   Final    Culture  Setup Time 06/27/2012 16:26   Final    Colony Count 40,000 COLONIES/ML   Final    Culture     Final    Value: Multiple bacterial morphotypes present, none predominant. Suggest appropriate recollection if clinically indicated.   Report Status 06/28/2012 FINAL   Final   CULTURE, ROUTINE-ABSCESS     Status: Normal (Preliminary result)   Collection Time   06/28/12  4:26 PM      Component Value Range Status Comment   Specimen Description ABSCESS   Final    Special Requests LEFT RETROPERITONEAL FLUID   Final    Gram Stain     Final    Value: ABUNDANT WBC PRESENT,BOTH PMN AND MONONUCLEAR     FEW YEAST   Culture MULTIPLE ORGANISMS PRESENT, NONE PREDOMINANT   Final    Report Status PENDING   Incomplete   ANAEROBIC CULTURE     Status: Normal (Preliminary result)   Collection Time   06/28/12  4:26 PM      Component Value Range Status Comment   Specimen Description ABSCESS   Final    Special Requests LEFT RETROPERITONEAL FLUID   Final    Gram Stain     Final    Value: ABUNDANT WBC PRESENT,BOTH PMN AND MONONUCLEAR     FEW YEAST   Culture     Final    Value: NO ANAEROBES ISOLATED; CULTURE  IN  PROGRESS FOR 5 DAYS   Report Status PENDING   Incomplete   FUNGUS CULTURE W SMEAR     Status: Normal (Preliminary result)   Collection Time   06/28/12  4:26 PM      Component Value Range Status Comment   Specimen Description ABSCESS   Final    Special Requests LEFT RETROPERITONEAL FLUID   Final    Fungal Smear NO YEAST OR FUNGAL ELEMENTS SEEN   Final    Culture CULTURE IN PROGRESS FOR FOUR WEEKS   Final    Report Status PENDING   Incomplete      Studies: No results found.  Scheduled Meds:    . aspirin EC  81 mg Oral Daily  . carvedilol  3.125 mg Oral BID WC  . cefTAZidime (FORTAZ)  IV  2 g Intravenous Q T,Th,Sat-1800  . darbepoetin  100 mcg Intravenous Q Sat-HD  . docusate sodium  100 mg Oral QHS  . feeding supplement (NEPRO CARB STEADY)  237 mL Oral BID WC  . hydrALAZINE  25 mg Oral Q8H  . HYDROmorphone  4 mg Oral Q4H  . iron dextran (INFED/DEXFERRUM) infusion  100 mg Intravenous Q T,Th,Sa-HD  . isosorbide mononitrate  30 mg Oral Daily  . magnesium oxide  400 mg Oral Daily  . methadone  10 mg Oral TID  . micafungin [facility]) IV  100 mg Intravenous Daily  . multivitamin  1 tablet Oral QHS  . pantoprazole  40 mg Oral Daily  . ranolazine  500 mg Oral Daily  . sevelamer  400 mg Oral Q breakfast  . simvastatin  10 mg Oral q1800  . sodium chloride  3 mL Intravenous Q12H  . vancomycin  500 mg Intravenous Q T,Th,Sa-HD   Continuous Infusions:   Active Problems:  Pancreatitis, chronic  ESRD on hemodialysis  Pancreatic necrosis       Mikale Silversmith  [facility] Pager [phone]. If 8PM-8AM, please contact night-coverage at [email or web address], password [facility] 06/30/2012, 4:06 PM  LOS: 5 days

## 2012-07-01 ENCOUNTER — Inpatient Hospital Stay (HOSPITAL_COMMUNITY): Payer: Non-veteran care

## 2012-07-01 NOTE — Progress Notes (Signed)
PT Cancellation Note  Patient Details Name: KANYON BUNN MRN: [id number] DOB: [birth year]   Cancelled Treatment:    Reason Eval/Treat Not Completed: Patient at procedure or test/unavailable;Other (comment) (pt. having paracentesis when checked on this am)   Ferman Hamming 07/01/2012, 3:40 PM Weldon Picking PT [facility] [phone] Beeper [phone]

## 2012-07-01 NOTE — Progress Notes (Addendum)
NUTRITION FOLLOW UP  Intervention:   1. Nutrition Ambassador to adhere to patient's foods preferences 2. Continue Nepro Shakes as able 3. RD to continue to follow nutrition care plan  DOCUMENTATION CODES  Per approved criteria   -Severe malnutrition in the context of chronic illness    Nutrition Dx:   Inadequate oral intake related to abdominal pain 2/2 chronic pancreatitis as evidenced by ongoing weight loss.   Goal:   Consume 50% of meals and supplements.  Monitor:   Weights, labs, PO intake, I/O's  Assessment:   Underwent CT-guided aspiration of pancreatic abscess on 1/14. Results currently pending.  Current intake is 5% of meals. Potassium and phosphorus are WNL at this time. Sodium is trending down. Continues with Nepro po BID, states that she is able to take it at least twice daily, sometimes up to three times a day. States that she is eating fair, however sometimes cannot get the foods that she wants. RD to add preferences into the foodservice computer system for her. She also notes that sometimes she has difficulty chewing foods, however does not want a modified diet.  Height: Ht Readings from Last 1 Encounters:  06/26/12 5\' 4"  (1.626 m)    Weight Status:  Stable; s/p HD on 1/16 Wt Readings from Last 1 Encounters:  06/30/12 121 lb 0.5 oz (54.9 kg)    Re-estimated needs:  Kcal: 1600 - 1800 kcal Protein: 80 - 90 grams protein Fluid: 1.2 liters daily  Skin: intact  Diet Order: Renal 80-90; 1200 ml fluid restriction   Intake/Output Summary (Last 24 hours) at 07/01/12 1009 Last data filed at 07/01/12 0900  Gross per 24 hour  Intake    120 ml  Output   3000 ml  Net  -2880 ml    Last BM: 1/15   Labs:   Lab 06/30/12 1430 06/28/12 0659 06/28/12 0500 06/26/12 0602  NA 130* -- 132* 132*  K 3.5 -- 4.3 6.0*  CL 93* -- 94* 96  CO2 27 -- 25 21  BUN 32* -- 36* 67*  CREATININE 3.51* -- 4.39* 6.62*  CALCIUM 10.9* -- 10.5 10.7*  MG -- 2.4 -- --  PHOS 4.4 --  5.2* --  GLUCOSE 138* -- 109* 86    CBG (last 3)  No results found for this basename: GLUCAP:3 in the last 72 hours  Scheduled Meds:   . aspirin EC  81 mg Oral Daily  . carvedilol  3.125 mg Oral BID WC  . cefTAZidime (FORTAZ)  IV  2 g Intravenous Q T,Th,Sat-1800  . darbepoetin  100 mcg Intravenous Q Sat-HD  . docusate sodium  100 mg Oral QHS  . feeding supplement (NEPRO CARB STEADY)  237 mL Oral BID WC  . hydrALAZINE  25 mg Oral Q8H  . HYDROmorphone  4 mg Oral Q4H  . iron dextran (INFED/DEXFERRUM) infusion  100 mg Intravenous Q T,Th,Sa-HD  . isosorbide mononitrate  30 mg Oral Daily  . magnesium oxide  400 mg Oral Daily  . methadone  10 mg Oral TID  . micafungin [facility]) IV  100 mg Intravenous Daily  . multivitamin  1 tablet Oral QHS  . pantoprazole  40 mg Oral Daily  . ranolazine  500 mg Oral Daily  . sevelamer  400 mg Oral Q breakfast  . simvastatin  10 mg Oral q1800  . sodium chloride  3 mL Intravenous Q12H  . vancomycin  500 mg Intravenous Q T,Th,Sa-HD    Continuous Infusions:   Jarold Motto  MS, RD, LDN Pager: [phone] After-hours pager: [phone]

## 2012-07-01 NOTE — Progress Notes (Signed)
ANTIBIOTIC CONSULT NOTE - FOLLOW UP  Pharmacy Consult for Vancomycin + Ceftazidime Indication: Pancreatic abscess   Allergies  Allergen Reactions  . Codeine Itching  . Morphine And Related Shortness Of Breath    SOB when given IV once "to me it was mild; I called out fast for help; never had to intubate" (05/23/2012)  . Ace Inhibitors Other (See Comments)    Unknown reaction but her physician stated that she can't take it (specifically lisinopril)  . Tylenol (Acetaminophen) Other (See Comments)    Patient states that the doctor told her she has a Fatty Liver.    Patient Measurements: Height: 5\' 4"  (162.6 cm) Weight: 121 lb 0.5 oz (54.9 kg) IBW/kg (Calculated) : 54.7   Vital Signs: Temp: 98.2 F (36.8 C) (01/17 0912) Temp src: Oral (01/17 0912) BP: 126/77 mmHg (01/17 0912) Pulse Rate: 85  (01/17 0912) Intake/Output from previous day: 01/16 0701 - 01/17 0700 In: 240 [P.O.:240] Out: 3000  Intake/Output from this shift: Total I/O In: 120 [P.O.:120] Out: -   Labs:  Basename 06/30/12 1430  WBC 11.2*  HGB 8.6*  PLT 159  LABCREA --  CREATININE 3.51*   Estimated Creatinine Clearance: 14.4 ml/min (by C-G formula based on Cr of 3.51). No results found for this basename: VANCOTROUGH:2,VANCOPEAK:2,VANCORANDOM:2,GENTTROUGH:2,GENTPEAK:2,GENTRANDOM:2,TOBRATROUGH:2,TOBRAPEAK:2,TOBRARND:2,AMIKACINPEAK:2,AMIKACINTROU:2,AMIKACIN:2, in the last 72 hours   Microbiology: Recent Results (from the past 720 hour(s))  MRSA PCR SCREENING     Status: Normal   Collection Time   06/26/12  3:20 AM      Component Value Range Status Comment   MRSA by PCR NEGATIVE  NEGATIVE Final   URINE CULTURE     Status: Normal   Collection Time   06/27/12  6:36 AM      Component Value Range Status Comment   Specimen Description URINE, RANDOM   Final    Special Requests NONE   Final    Culture  Setup Time 06/27/2012 16:26   Final    Colony Count 40,000 COLONIES/ML   Final    Culture     Final    Value:  Multiple bacterial morphotypes present, none predominant. Suggest appropriate recollection if clinically indicated.   Report Status 06/28/2012 FINAL   Final   CULTURE, ROUTINE-ABSCESS     Status: Normal (Preliminary result)   Collection Time   06/28/12  4:26 PM      Component Value Range Status Comment   Specimen Description ABSCESS   Final    Special Requests LEFT RETROPERITONEAL FLUID   Final    Gram Stain     Final    Value: ABUNDANT WBC PRESENT,BOTH PMN AND MONONUCLEAR     FEW YEAST   Culture MULTIPLE ORGANISMS PRESENT, NONE PREDOMINANT   Final    Report Status PENDING   Incomplete   ANAEROBIC CULTURE     Status: Normal (Preliminary result)   Collection Time   06/28/12  4:26 PM      Component Value Range Status Comment   Specimen Description ABSCESS   Final    Special Requests LEFT RETROPERITONEAL FLUID   Final    Gram Stain     Final    Value: ABUNDANT WBC PRESENT,BOTH PMN AND MONONUCLEAR     FEW YEAST   Culture     Final    Value: NO ANAEROBES ISOLATED; CULTURE IN PROGRESS FOR 5 DAYS   Report Status PENDING   Incomplete   FUNGUS CULTURE W SMEAR     Status: Normal (Preliminary result)   Collection  Time   06/28/12  4:26 PM      Component Value Range Status Comment   Specimen Description ABSCESS   Final    Special Requests LEFT RETROPERITONEAL FLUID   Final    Fungal Smear NO YEAST OR FUNGAL ELEMENTS SEEN   Final    Culture CULTURE IN PROGRESS FOR FOUR WEEKS   Final    Report Status PENDING   Incomplete     Anti-infectives     Start     Dose/Rate Route Frequency Ordered Stop   06/30/12 1800   cefTAZidime (FORTAZ) 2 g in dextrose 5 % 50 mL IVPB        2 g 100 mL/hr over 30 Minutes Intravenous Every T-Th-Sa (1800) 06/29/12 1831     06/30/12 1200   vancomycin (VANCOCIN) 500 mg in sodium chloride 0.9 % 100 mL IVPB        500 mg 100 mL/hr over 60 Minutes Intravenous Every T-Th-Sa (Hemodialysis) 06/29/12 1831     06/29/12 2000   vancomycin (VANCOCIN) 1,250 mg in sodium  chloride 0.9 % 250 mL IVPB        1,250 mg 166.7 mL/hr over 90 Minutes Intravenous  Once 06/29/12 1831 06/30/12 0029   06/29/12 1930   cefTAZidime (FORTAZ) 1 g in dextrose 5 % 50 mL IVPB        1 g 100 mL/hr over 30 Minutes Intravenous  Once 06/29/12 1831 06/29/12 2049   06/29/12 1900   micafungin (MYCAMINE) 100 mg in sodium chloride 0.9 % 100 mL IVPB        100 mg 100 mL/hr over 1 Hours Intravenous Daily 06/29/12 1757     06/26/12 0100   sulfamethoxazole-trimethoprim (BACTRIM DS) 800-160 MG per tablet 2 tablet  Status:  Discontinued        2 tablet Oral Daily at bedtime 06/26/12 0050 06/29/12 1758   06/25/12 2315   sulfamethoxazole-trimethoprim (BACTRIM DS,SEPTRA DS) 800-160 MG per tablet 1 tablet  Status:  Discontinued        1 tablet Oral 2 times daily 06/25/12 2301 06/26/12 0048          Assessment: 62 y.o F with ESRD who presented to the [facility] on 06/25/12 with abdominal pain. The patient has a PMH significant for chronic/necrotizing pancreatitis with hx pancreatic pseudocyst. IR was consulted and and were able to aspirate the left perisplenic / retroperitoneal fluid and gas collection -- which has been sent for culturing. ID has consulted pharmacy to dose Vancomycin + Ceftazidime along with Micafungin per MD for empiric coverage while awaiting culture results. Cultures thus far are still pending however show multiple organisms and few yeast on gram stain.  The patient receives HD on T/Th/Sat. The patient was loaded with Vancomycin on 1/15 and received a maintenance dose appropriately after tolerating a full HD session on 1/16. Doses remain appropriate at this time of both Vanc/Ceftaz.  Goal of Therapy:  Pre-HD Vancomycin level of 15-25 mcg/ml  Plan:  1. Continue Vancomycin 500 mg IV post HD sessions on T/Th/Sat 2. Continue Ceftazidime 2g IV post HD sessions on T/Th/Sat 3. Will continue to follow HD schedule/duration, culture results, LOT, and antibiotic de-escalation plans    Georgina Pillion, PharmD, BCPS Clinical Pharmacist Pager: [phone] 07/01/2012 10:32 AM

## 2012-07-01 NOTE — Progress Notes (Signed)
INFECTIOUS DISEASE PROGRESS NOTE  ID: [name] is a 63 y.o. female with  Active Problems:  Ischemic cardiomyopathy, EF 20-25% echo April 2013, now 10% by cath (05/06/12)  Chronic systolic congestive heart failure, NYHA class 3  LBBB (left bundle branch block)  Hypertension  Pancreatic pseudocyst  Pancreatitis, chronic  Thrombocytopenia  Anemia in chronic kidney disease (CKD)  Esophageal varices in cirrhosis, admitted June 2013 to [facility]  History of Portal vein thrombosis  Ascites  ESRD on hemodialysis  CAD, CABG X 4 Feb '08, LAD stent June '08  ICD (implantable cardiac defibrillator) in place, MDT placed Jan 2013 [facility] [place]  Pancreatic necrosis  Subjective: Feels better after paracentesis  Abtx:  Anti-infectives     Start     Dose/Rate Route Frequency Ordered Stop   06/30/12 1800   cefTAZidime (FORTAZ) 2 g in dextrose 5 % 50 mL IVPB        2 g 100 mL/hr over 30 Minutes Intravenous Every T-Th-Sa (1800) 06/29/12 1831     06/30/12 1200   vancomycin (VANCOCIN) 500 mg in sodium chloride 0.9 % 100 mL IVPB        500 mg 100 mL/hr over 60 Minutes Intravenous Every T-Th-Sa (Hemodialysis) 06/29/12 1831     06/29/12 2000   vancomycin (VANCOCIN) 1,250 mg in sodium chloride 0.9 % 250 mL IVPB        1,250 mg 166.7 mL/hr over 90 Minutes Intravenous  Once 06/29/12 1831 06/30/12 0029   06/29/12 1930   cefTAZidime (FORTAZ) 1 g in dextrose 5 % 50 mL IVPB        1 g 100 mL/hr over 30 Minutes Intravenous  Once 06/29/12 1831 06/29/12 2049   06/29/12 1900   micafungin (MYCAMINE) 100 mg in sodium chloride 0.9 % 100 mL IVPB        100 mg 100 mL/hr over 1 Hours Intravenous Daily 06/29/12 1757     06/26/12 0100   sulfamethoxazole-trimethoprim (BACTRIM DS) 800-160 MG per tablet 2 tablet  Status:  Discontinued        2 tablet Oral Daily at bedtime 06/26/12 0050 06/29/12 1758   06/25/12 2315   sulfamethoxazole-trimethoprim (BACTRIM DS,SEPTRA DS) 800-160 MG per tablet 1 tablet  Status:   Discontinued        1 tablet Oral 2 times daily 06/25/12 2301 06/26/12 0048          Medications:  Scheduled:   . aspirin EC  81 mg Oral Daily  . carvedilol  3.125 mg Oral BID WC  . cefTAZidime (FORTAZ)  IV  2 g Intravenous Q T,Th,Sat-1800  . darbepoetin  100 mcg Intravenous Q Sat-HD  . docusate sodium  100 mg Oral QHS  . feeding supplement (NEPRO CARB STEADY)  237 mL Oral BID WC  . hydrALAZINE  25 mg Oral Q8H  . HYDROmorphone  4 mg Oral Q4H  . iron dextran (INFED/DEXFERRUM) infusion  100 mg Intravenous Q T,Th,Sa-HD  . isosorbide mononitrate  30 mg Oral Daily  . magnesium oxide  400 mg Oral Daily  . methadone  10 mg Oral TID  . micafungin [facility]) IV  100 mg Intravenous Daily  . multivitamin  1 tablet Oral QHS  . pantoprazole  40 mg Oral Daily  . ranolazine  500 mg Oral Daily  . sevelamer  400 mg Oral Q breakfast  . simvastatin  10 mg Oral q1800  . sodium chloride  3 mL Intravenous Q12H  . vancomycin  500 mg Intravenous  Q T,Th,Sa-HD    Objective: Vital signs in last 24 hours: Temp:  [98.2 F (36.8 C)-98.3 F (36.8 C)] 98.2 F (36.8 C) (01/17 1427) Pulse Rate:  [79-92] 92  (01/17 1427) Resp:  [16-20] 18  (01/17 1427) BP: (111-126)/(64-84) 119/79 mmHg (01/17 1427) SpO2:  [94 %-97 %] 94 % (01/17 1427)   General appearance: alert, cooperative and no distress GI: normal findings: bowel sounds normal and soft, non-tender and abnormal findings:  distended and slightly less distension than previous.  Lab Results  Basename 06/30/12 1430  WBC 11.2*  HGB 8.6*  HCT 27.2*  NA 130*  K 3.5  CL 93*  CO2 27  BUN 32*  CREATININE 3.51*  GLU --   Liver Panel  Basename 06/30/12 1430  PROT --  ALBUMIN 1.8*  AST --  ALT --  ALKPHOS --  BILITOT --  BILIDIR --  IBILI --   Sedimentation Rate No results found for this basename: ESRSEDRATE in the last 72 hours C-Reactive Protein No results found for this basename: CRP:2 in the last 72 hours  Microbiology: Recent  Results (from the past 240 hour(s))  MRSA PCR SCREENING     Status: Normal   Collection Time   06/26/12  3:20 AM      Component Value Range Status Comment   MRSA by PCR NEGATIVE  NEGATIVE Final   URINE CULTURE     Status: Normal   Collection Time   06/27/12  6:36 AM      Component Value Range Status Comment   Specimen Description URINE, RANDOM   Final    Special Requests NONE   Final    Culture  Setup Time 06/27/2012 16:26   Final    Colony Count 40,000 COLONIES/ML   Final    Culture     Final    Value: Multiple bacterial morphotypes present, none predominant. Suggest appropriate recollection if clinically indicated.   Report Status 06/28/2012 FINAL   Final   CULTURE, ROUTINE-ABSCESS     Status: Normal   Collection Time   06/28/12  4:26 PM      Component Value Range Status Comment   Specimen Description ABSCESS   Final    Special Requests LEFT RETROPERITONEAL FLUID   Final    Gram Stain     Final    Value: ABUNDANT WBC PRESENT,BOTH PMN AND MONONUCLEAR     FEW YEAST   Culture     Final    Value: MULTIPLE ORGANISMS PRESENT, NONE PREDOMINANT     Note: NO STAPHYLOCOCCUS AUREUS ISOLATED NO GROUP A STREP (S.PYOGENES) ISOLATED   Report Status 07/01/2012 FINAL   Final   ANAEROBIC CULTURE     Status: Normal (Preliminary result)   Collection Time   06/28/12  4:26 PM      Component Value Range Status Comment   Specimen Description ABSCESS   Final    Special Requests LEFT RETROPERITONEAL FLUID   Final    Gram Stain     Final    Value: ABUNDANT WBC PRESENT,BOTH PMN AND MONONUCLEAR     FEW YEAST   Culture     Final    Value: NO ANAEROBES ISOLATED; CULTURE IN PROGRESS FOR 5 DAYS   Report Status PENDING   Incomplete   FUNGUS CULTURE W SMEAR     Status: Normal (Preliminary result)   Collection Time   06/28/12  4:26 PM      Component Value Range Status Comment   Specimen Description ABSCESS  Final    Special Requests LEFT RETROPERITONEAL FLUID   Final    Fungal Smear NO YEAST OR FUNGAL  ELEMENTS SEEN   Final    Culture CULTURE IN PROGRESS FOR FOUR WEEKS   Final    Report Status PENDING   Incomplete     Studies/Results: US Paracentesis  07/01/2012  *RADIOLOGY REPORT*  Clinical Data: Abdominal pain secondary to pancreatitis with pseudocyst, ascites  ULTRASOUND GUIDED PARACENTESIS  Comparison:  None  An ultrasound guided paracentesis was thoroughly discussed with the patient and questions answered.  The benefits, risks, alternatives and complications were also discussed.  The patient understands and wishes to proceed with the procedure.  Written consent was obtained.  Ultrasound was performed to localize and mark an adequate pocket of fluid in the left lower quadrant of the abdomen.  The area was then prepped and draped in the normal sterile fashion.  1% Lidocaine was used for local anesthesia.  Under ultrasound guidance a 19 gauge Yueh catheter was introduced.  Paracentesis was performed.  The catheter was removed and a dressing applied.  Complications:  None  Findings:  A total of approximately 3 liters of amber colored fluid was removed.  A fluid sample was not sent for laboratory analysis.  IMPRESSION: Successful ultrasound guided paracentesis yielding 3 liters of ascites.  Read by Brayton El PA-C   Original Report Authenticated By: Malachy Moan, M.D.      Assessment/Plan: Pancreatic Abscess  ESRD  Cirrhosis    Cx unrevealing so far. I have asked lab to isolate organisms If d/c could send home with voriconcazole to substitute for mycafungin.  Would be happy to see in id clinic for f/u Comment- will try to avoid ampho, if her yeast Cx is again C parapsilosis may be forced to use. Will send her isolate for sensi testing regardless.  Total days of antibiotics 4 (vanco/ceftaz)     * Johny Sax Infectious Diseases [phone] 07/01/2012, 6:08 PM   LOS: 6 days

## 2012-07-01 NOTE — Progress Notes (Signed)
[facility] PROGRESS NOTE  [name] ZOX:[id number] DOB: April 25, 1951 DOA: 06/25/2012 PCP: Jyl Heinz, MD  Assessment/Plan:  1-Abdominal pain: Patient with history of complex infected pseudoocyst with pancreatic necrosis last admission 05-22-2012. She was treated with IV antibiotics ceftazidime, and Vancomycin until 06/07/12. Suppose to have CT scan 06-10-2012. She has history of chronic pancreatitis. She presents with worsening abdominal pain. 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 Ascites. Abnormal hepatic parenchymal perfusion with diffuse portal venous thrombosis. Changes have progressed since previous study.  -Surgery consulted. Patient refused pancreatectomy  - Patient has chronic abd pain from chronic necrotizing pancreatitis - would adjust methadone to provide better pain control - recurrent ascites for paracentesis by IR 07/01/12 with 3 liters removed  - aspirate culture from pancreatic abscess pending  - started on empiric abx 1/15/   2. ESRD: Continue with Dialysis. Correction of electrolytes during dialysis. No complaint with dialysis, missed last 2 session prior to admission.  3-Ischemic cardiomyopathy status [employer] pacemaker/AICD with EF 10% in 04/2012: Continue with Zocor, IMDUR, Coreg, aspirin.  4-Hyperkalemia: Correction with dialysis.  5-Weakness: PT, OT . Neuro exam non focal.    Code Status: full. Family Communication: Care discussed with patient.  Disposition Plan: home    Consultants:  Nephrologist.   ID  Procedures:  None.  Antibiotics:  Vanc 1/15  Fortaz 1/15   Micafungin  1/15  HPI/Subjective: She feels better and is less distended post paracentesis    Objective: Filed Vitals:   07/01/12 1024 07/01/12 1035 07/01/12 1056 07/01/12 1427  BP: 118/72 113/65 111/64 119/79  Pulse:    92  Temp:    98.2 F (36.8 C)  TempSrc:    Oral  Resp:    18  Height:        Weight:      SpO2:    94%   Patient Vitals for the past 24 hrs:  BP Temp Temp src Pulse Resp SpO2 Weight  07/01/12 1427 119/79 mmHg 98.2 F (36.8 C) Oral 92  18  94 % -  07/01/12 1056 111/64 mmHg - - - - - -  07/01/12 1035 113/65 mmHg - - - - - -  07/01/12 1024 118/72 mmHg - - - - - -  07/01/12 0912 126/77 mmHg 98.2 F (36.8 C) Oral 85  20  97 % -  07/01/12 0421 122/84 mmHg 98.3 F (36.8 C) Oral 87  16  94 % -  06/30/12 2049 113/69 mmHg 98.2 F (36.8 C) Oral 79  16  96 % -  06/30/12 1750 139/82 mmHg 96.7 F (35.9 C) Oral 79  18  - 54.9 kg (121 lb 0.5 oz)  06/30/12 1720 132/82 mmHg - - 82  18  - -  06/30/12 1650 130/85 mmHg - - 82  20  - -  06/30/12 1622 114/87 mmHg - - 81  21  - -  06/30/12 1550 124/88 mmHg - - 87  16  - -     Intake/Output Summary (Last 24 hours) at 07/01/12 1548 Last data filed at 07/01/12 1300  Gross per 24 hour  Intake    240 ml  Output   3000 ml  Net  -2760 ml   Filed Weights   06/29/12 2134 06/30/12 1330 06/30/12 1750  Weight: 55.339 kg (122 lb) 57.6 kg (126 lb 15.8 oz) 54.9 kg (121 lb 0.5 oz)    Exam:   General:  No distress.  Cardiovascular: S 1,  S 2, RRR.   Respiratory: CTA.  Abdomen: BS present, soft, decrease distension   Data Reviewed: Basic Metabolic Panel:  Lab 06/30/12 1610 06/28/12 0659 06/28/12 0500 06/26/12 0602 06/25/12 2328 06/25/12 1726  NA 130* -- 132* 132* -- 130*  K 3.5 -- 4.3 6.0* -- 5.4*  CL 93* -- 94* 96 -- 92*  CO2 27 -- 25 21 -- 22  GLUCOSE 138* -- 109* 86 -- 64*  BUN 32* -- 36* 67* -- 60*  CREATININE 3.51* -- 4.39* 6.62* 6.37* 6.17*  CALCIUM 10.9* -- 10.5 10.7* -- 11.2*  MG -- 2.4 -- -- -- --  PHOS 4.4 -- 5.2* -- -- --   Liver Function Tests:  Lab 06/30/12 1430 06/28/12 0500 06/25/12 1726  AST -- -- 14  ALT -- -- 5  ALKPHOS -- -- 118*  BILITOT -- -- 0.3  PROT -- -- 7.3  ALBUMIN 1.8* 1.9* 2.0*    Lab 06/25/12 1846  LIPASE 8*  AMYLASE --   No results found for this basename: AMMONIA:5 in the  last 168 hours CBC:  Lab 06/30/12 1430 06/28/12 0659 06/26/12 0602 06/25/12 2328 06/25/12 1726  WBC 11.2* 9.4 9.5 10.3 9.7  NEUTROABS -- -- -- -- 7.2  HGB 8.6* 9.1* 9.6* 10.2* 9.5*  HCT 27.2* 29.1* 31.1* 33.2* 30.7*  MCV 88.9 89.5 89.1 88.5 89.0  PLT 159 166 219 232 205   Cardiac Enzymes:  Lab 06/26/12 1432 06/26/12 0602 06/25/12 2329  CKTOTAL -- -- --  CKMB -- -- --  CKMBINDEX -- -- --  TROPONINI <0.30 <0.30 <0.30   BNP (last 3 results)  Basename 06/02/12 1944 05/08/12 0515 04/21/12 1419  PROBNP >70000.0* >70000.0* >70000.0*   CBG:  Lab 06/25/12 2315  GLUCAP 81    Recent Results (from the past 240 hour(s))  MRSA PCR SCREENING     Status: Normal   Collection Time   06/26/12  3:20 AM      Component Value Range Status Comment   MRSA by PCR NEGATIVE  NEGATIVE Final   URINE CULTURE     Status: Normal   Collection Time   06/27/12  6:36 AM      Component Value Range Status Comment   Specimen Description URINE, RANDOM   Final    Special Requests NONE   Final    Culture  Setup Time 06/27/2012 16:26   Final    Colony Count 40,000 COLONIES/ML   Final    Culture     Final    Value: Multiple bacterial morphotypes present, none predominant. Suggest appropriate recollection if clinically indicated.   Report Status 06/28/2012 FINAL   Final   CULTURE, ROUTINE-ABSCESS     Status: Normal   Collection Time   06/28/12  4:26 PM      Component Value Range Status Comment   Specimen Description ABSCESS   Final    Special Requests LEFT RETROPERITONEAL FLUID   Final    Gram Stain     Final    Value: ABUNDANT WBC PRESENT,BOTH PMN AND MONONUCLEAR     FEW YEAST   Culture     Final    Value: MULTIPLE ORGANISMS PRESENT, NONE PREDOMINANT     Note: NO STAPHYLOCOCCUS AUREUS ISOLATED NO GROUP A STREP (S.PYOGENES) ISOLATED   Report Status 07/01/2012 FINAL   Final   ANAEROBIC CULTURE     Status: Normal (Preliminary result)   Collection Time   06/28/12  4:26 PM      Component Value Range  Status  Comment   Specimen Description ABSCESS   Final    Special Requests LEFT RETROPERITONEAL FLUID   Final    Gram Stain     Final    Value: ABUNDANT WBC PRESENT,BOTH PMN AND MONONUCLEAR     FEW YEAST   Culture     Final    Value: NO ANAEROBES ISOLATED; CULTURE IN PROGRESS FOR 5 DAYS   Report Status PENDING   Incomplete   FUNGUS CULTURE W SMEAR     Status: Normal (Preliminary result)   Collection Time   06/28/12  4:26 PM      Component Value Range Status Comment   Specimen Description ABSCESS   Final    Special Requests LEFT RETROPERITONEAL FLUID   Final    Fungal Smear NO YEAST OR FUNGAL ELEMENTS SEEN   Final    Culture CULTURE IN PROGRESS FOR FOUR WEEKS   Final    Report Status PENDING   Incomplete      Studies: No results found.  Scheduled Meds:    . aspirin EC  81 mg Oral Daily  . carvedilol  3.125 mg Oral BID WC  . cefTAZidime (FORTAZ)  IV  2 g Intravenous Q T,Th,Sat-1800  . darbepoetin  100 mcg Intravenous Q Sat-HD  . docusate sodium  100 mg Oral QHS  . feeding supplement (NEPRO CARB STEADY)  237 mL Oral BID WC  . hydrALAZINE  25 mg Oral Q8H  . HYDROmorphone  4 mg Oral Q4H  . iron dextran (INFED/DEXFERRUM) infusion  100 mg Intravenous Q T,Th,Sa-HD  . isosorbide mononitrate  30 mg Oral Daily  . magnesium oxide  400 mg Oral Daily  . methadone  10 mg Oral TID  . micafungin [facility]) IV  100 mg Intravenous Daily  . multivitamin  1 tablet Oral QHS  . pantoprazole  40 mg Oral Daily  . ranolazine  500 mg Oral Daily  . sevelamer  400 mg Oral Q breakfast  . simvastatin  10 mg Oral q1800  . sodium chloride  3 mL Intravenous Q12H  . vancomycin  500 mg Intravenous Q T,Th,Sa-HD   Continuous Infusions:   Active Problems:  Ischemic cardiomyopathy, EF 20-25% echo April 2013, now 10% by cath (05/06/12)  Chronic systolic congestive heart failure, NYHA class 3  LBBB (left bundle branch block)  Hypertension  Pancreatic pseudocyst  Pancreatitis, chronic  Thrombocytopenia  Anemia  in chronic kidney disease (CKD)  Esophageal varices in cirrhosis, admitted June 2013 to [facility]  History of Portal vein thrombosis  Ascites  ESRD on hemodialysis  CAD, CABG X 4 Feb '08, LAD stent June '08  ICD (implantable cardiac defibrillator) in place, MDT placed Jan 2013 [facility] [place]  Pancreatic necrosis       Caedon Bond  [facility] Pager [phone]. If 8PM-8AM, please contact night-coverage at [email or web address], password [facility] 07/01/2012, 3:48 PM  LOS: 6 days

## 2012-07-01 NOTE — Procedures (Signed)
Successful US guided paracentesis from LLQ.  Yielded 3L of amber colored fluid.  No immediate complications.  Pt tolerated well.   Specimen was not sent for labs.  Brayton El PA-C 07/01/2012 11:00 AM

## 2012-07-01 NOTE — Progress Notes (Signed)
[facility] Progress Note  Subjective:   Feeling great after paracentesis. No complaints.  Objective Filed Vitals:   07/01/12 0912 07/01/12 1024 07/01/12 1035 07/01/12 1056  BP: 126/77 118/72 113/65 111/64  Pulse: 85     Temp: 98.2 F (36.8 C)     TempSrc: Oral     Resp: 20     Height:      Weight:      SpO2: 97%      Physical Exam General: Chronically ill-appearing.  Alert, oriented, NAD. Heart: RRR, AICD left chest Lungs: CTA bilaterally. No wheezes, rales or rhonchi appreciated. Abdomen: Soft, NT, trace distension, normal BS Extremities: 2+ LE edema bilaterally. Dialysis Access:  Rt I-J, LFA AVG unusable  Outpatient HD- [facility] On TTS, 4 hrs, Edw= 53.5 kg, heparin , EPO 1500 units q hd, no vit d last pth=178.2 (05/05/12) Bath= 3.0 k, 2.5 Ca, 1.0 mg Was on VAncomycin q hd and Fortaz 2gm qhd since 12/14./13 TFS 13% Iron load order X 10 x 06/13/12 ( needing 7 more doses )   Assessment/Plan: 1. Abd pain/hx pancreatitis/?pancreatic abcess- aspirate culture GS +yeast and culture +mult organisms; on empiric abx and antifungal Rx per ID 2. ESRD, cont hd TTS 3. Dialysis access- using catheter, on schedule at [facility] med center for new R avgg Dr. Hollace Hayward 07/13/12 4. Anemia - hgb 8.6 from 9.1 On aranesp 100. Lower Value related to her missing epo doses and infection/ needing 7 more doses infed 5. Secondary hyperparathyroidism - Ca Corrected 12.7, no vit d , phos 4.4 ,  2.0 ca bath for now/ on Renvela Binder breakfast only 6. HTN/volume - bp ok, 1-2 kg up, on coreg, hydralazine and imdur; decrease as needed 7. Hx of cirrhosis, chronic ascites, hx of esoph varices with banding, and hx of known portal vein and splenic vein thrombosis  8. CM EF 10% w AICD 9. DNR   Scot Jun. Thad Ranger [facility] [phone] pager 07/01/2012,12:47 PM  LOS: 6 days   Patient seen and examined and above reviewed.  Agree with assessment and plan as above. Vinson Moselle   MD [facility] [phone] pgr    [phone] cell 07/01/2012, 3:42 PM   Additional Objective Labs: Basic Metabolic Panel:  Lab 06/30/12 4696 06/28/12 0500 06/26/12 0602  NA 130* 132* 132*  K 3.5 4.3 6.0*  CL 93* 94* 96  CO2 27 25 21   GLUCOSE 138* 109* 86  BUN 32* 36* 67*  CREATININE 3.51* 4.39* 6.62*  CALCIUM 10.9* 10.5 10.7*  ALB -- -- --  PHOS 4.4 5.2* --   Liver Function Tests:  Lab 06/30/12 1430 06/28/12 0500 06/25/12 1726  AST -- -- 14  ALT -- -- 5  ALKPHOS -- -- 118*  BILITOT -- -- 0.3  PROT -- -- 7.3  ALBUMIN 1.8* 1.9* 2.0*    Lab 06/25/12 1846  LIPASE 8*  AMYLASE --   CBC:  Lab 06/30/12 1430 06/28/12 0659 06/26/12 0602 06/25/12 2328 06/25/12 1726  WBC 11.2* 9.4 9.5 -- --  NEUTROABS -- -- -- -- 7.2  HGB 8.6* 9.1* 9.6* -- --  HCT 27.2* 29.1* 31.1* -- --  MCV 88.9 89.5 89.1 88.5 89.0  PLT 159 166 219 -- --   Blood Culture    Component Value Date/Time   SDES ABSCESS 06/28/2012 1626   SDES ABSCESS 06/28/2012 1626   SDES ABSCESS 06/28/2012 1626   SPECREQUEST LEFT RETROPERITONEAL FLUID 06/28/2012 1626   SPECREQUEST LEFT RETROPERITONEAL  FLUID 06/28/2012 1626   SPECREQUEST LEFT RETROPERITONEAL FLUID 06/28/2012 1626   CULT MULTIPLE ORGANISMS PRESENT, NONE PREDOMINANT 06/28/2012 1626   CULT NO ANAEROBES ISOLATED; CULTURE IN PROGRESS FOR 5 DAYS 06/28/2012 1626   CULT CULTURE IN PROGRESS FOR FOUR WEEKS 06/28/2012 1626   REPTSTATUS PENDING 06/28/2012 1626   REPTSTATUS PENDING 06/28/2012 1626   REPTSTATUS PENDING 06/28/2012 1626    Cardiac Enzymes:  Lab 06/26/12 1432 06/26/12 0602 06/25/12 2329  CKTOTAL -- -- --  CKMB -- -- --  CKMBINDEX -- -- --  TROPONINI <0.30 <0.30 <0.30   CBG:  Lab 06/25/12 2315  GLUCAP 81   Studies/Results: No results found. Medications:      . aspirin EC  81 mg Oral Daily  . carvedilol  3.125 mg Oral BID WC  . cefTAZidime (FORTAZ)  IV  2 g Intravenous Q T,Th,Sat-1800  . darbepoetin  100 mcg Intravenous Q Sat-HD    . docusate sodium  100 mg Oral QHS  . feeding supplement (NEPRO CARB STEADY)  237 mL Oral BID WC  . hydrALAZINE  25 mg Oral Q8H  . HYDROmorphone  4 mg Oral Q4H  . iron dextran (INFED/DEXFERRUM) infusion  100 mg Intravenous Q T,Th,Sa-HD  . isosorbide mononitrate  30 mg Oral Daily  . magnesium oxide  400 mg Oral Daily  . methadone  10 mg Oral TID  . micafungin [facility]) IV  100 mg Intravenous Daily  . multivitamin  1 tablet Oral QHS  . pantoprazole  40 mg Oral Daily  . ranolazine  500 mg Oral Daily  . sevelamer  400 mg Oral Q breakfast  . simvastatin  10 mg Oral q1800  . sodium chloride  3 mL Intravenous Q12H  . vancomycin  500 mg Intravenous Q T,Th,Sa-HD

## 2012-07-02 LAB — CBC
HCT: 29 % — ABNORMAL LOW (ref 36.0–46.0)
MCHC: 30.7 g/dL (ref 30.0–36.0)
MCV: 90.1 fL (ref 78.0–100.0)
RDW: 18 % — ABNORMAL HIGH (ref 11.5–15.5)

## 2012-07-02 LAB — RENAL FUNCTION PANEL
Albumin: 1.7 g/dL — ABNORMAL LOW (ref 3.5–5.2)
BUN: 29 mg/dL — ABNORMAL HIGH (ref 6–23)
Creatinine, Ser: 3.18 mg/dL — ABNORMAL HIGH (ref 0.50–1.10)
Phosphorus: 3.7 mg/dL (ref 2.3–4.6)

## 2012-07-02 MED ORDER — METHADONE HCL 5 MG PO TABS: 10.0000 mg | ORAL_TABLET | Freq: Three times a day (TID) | ORAL | Status: DC

## 2012-07-02 MED ORDER — HYDROMORPHONE HCL 2 MG PO TABS: 2.0000 mg | ORAL_TABLET | Freq: Four times a day (QID) | ORAL | Status: DC | PRN

## 2012-07-02 MED ORDER — HEPARIN SODIUM (PORCINE) 1000 UNIT/ML IJ SOLN: 1600.0000 mL | Freq: Once | INTRAMUSCULAR | Status: DC

## 2012-07-02 MED ORDER — VORICONAZOLE 200 MG PO TABS: 200.0000 mg | ORAL_TABLET | Freq: Every day | ORAL | Status: DC

## 2012-07-02 MED ORDER — HYDROMORPHONE HCL PF 1 MG/ML IJ SOLN
INTRAMUSCULAR | Status: AC
  Administered 2012-07-02: 1 mg via INTRAVENOUS
  Filled 2012-07-02: qty 1

## 2012-07-02 MED ORDER — VANCOMYCIN HCL 500 MG IV SOLR: INTRAVENOUS | Status: DC

## 2012-07-02 MED ORDER — DEXTROSE 5 % IV SOLN: INTRAVENOUS | Status: DC

## 2012-07-02 MED ORDER — DARBEPOETIN ALFA-POLYSORBATE 100 MCG/0.5ML IJ SOLN
INTRAMUSCULAR | Status: AC
  Administered 2012-07-02: 100 ug via INTRAVENOUS
  Filled 2012-07-02: qty 0.5

## 2012-07-02 MED ORDER — HEPARIN SODIUM (PORCINE) 1000 UNIT/ML IJ SOLN
1600.0000 [IU] | Freq: Once | INTRAMUSCULAR | Status: AC
  Administered 2012-07-02: 1600 [IU] via INTRAVENOUS

## 2012-07-02 MED ORDER — HYDRALAZINE HCL 10 MG PO TABS
10.0000 mg | ORAL_TABLET | Freq: Three times a day (TID) | ORAL | Status: DC
Start: 2012-07-02 — End: 2012-07-07
  Administered 2012-07-02 – 2012-07-06 (×7): 10 mg via ORAL
  Filled 2012-07-02 (×20): qty 1

## 2012-07-02 NOTE — Progress Notes (Signed)
Subjective:  On hd , Feels better since paracentesis, starting to eat better Objective Vital signs in last 24 hours: Filed Vitals:   07/02/12 0814 07/02/12 0830 07/02/12 0900 07/02/12 0930  BP: 114/78 121/84 118/62 107/74  Pulse: 83 90 76 73  Temp:      TempSrc:      Resp: 18 18 18 16   Height:      Weight:      SpO2:       Weight change: -5.3 kg (-11 lb 11 oz)  Intake/Output Summary (Last 24 hours) at 07/02/12 0953 Last data filed at 07/01/12 2140  Gross per 24 hour  Intake    460 ml  Output      0 ml  Net    460 ml   Labs: Basic Metabolic Panel:  Lab 07/02/12 4098 06/30/12 1430 06/28/12 0500  NA 128* 130* 132*  K 3.6 3.5 4.3  CL 91* 93* 94*  CO2 28 27 25   GLUCOSE 87 138* 109*  BUN 29* 32* 36*  CREATININE 3.18* 3.51* 4.39*  CALCIUM 10.6* 10.9* 10.5  ALB -- -- --  PHOS 3.7 4.4 5.2*   Liver Function Tests:  Lab 07/02/12 0716 06/30/12 1430 06/28/12 0500 06/25/12 1726  AST -- -- -- 14  ALT -- -- -- 5  ALKPHOS -- -- -- 118*  BILITOT -- -- -- 0.3  PROT -- -- -- 7.3  ALBUMIN 1.7* 1.8* 1.9* --    Lab 06/25/12 1846  LIPASE 8*  AMYLASE --   No results found for this basename: AMMONIA:3 in the last 168 hours CBC:  Lab 07/02/12 0716 06/30/12 1430 06/28/12 0659 06/26/12 0602 06/25/12 2328 06/25/12 1726  WBC 10.2 11.2* 9.4 -- -- --  NEUTROABS -- -- -- -- -- 7.2  HGB 8.9* 8.6* 9.1* -- -- --  HCT 29.0* 27.2* 29.1* -- -- --  MCV 90.1 88.9 89.5 89.1 88.5 --  PLT 161 159 166 -- -- --   Cardiac Enzymes:  Lab 06/26/12 1432 06/26/12 0602 06/25/12 2329  CKTOTAL -- -- --  CKMB -- -- --  CKMBINDEX -- -- --  TROPONINI <0.30 <0.30 <0.30   CBG:  Lab 06/25/12 2315  GLUCAP 81    Iron Studies: No results found for this basename: IRON,TIBC,TRANSFERRIN,FERRITIN in the last 72 hours Studies/Results: US Paracentesis  07/01/2012  *RADIOLOGY REPORT*  Clinical Data: Abdominal pain secondary to pancreatitis with pseudocyst, ascites  ULTRASOUND GUIDED PARACENTESIS   Comparison:  None  An ultrasound guided paracentesis was thoroughly discussed with the patient and questions answered.  The benefits, risks, alternatives and complications were also discussed.  The patient understands and wishes to proceed with the procedure.  Written consent was obtained.  Ultrasound was performed to localize and mark an adequate pocket of fluid in the left lower quadrant of the abdomen.  The area was then prepped and draped in the normal sterile fashion.  1% Lidocaine was used for local anesthesia.  Under ultrasound guidance a 19 gauge Yueh catheter was introduced.  Paracentesis was performed.  The catheter was removed and a dressing applied.  Complications:  None  Findings:  A total of approximately 3 liters of amber colored fluid was removed.  A fluid sample was not sent for laboratory analysis.  IMPRESSION: Successful ultrasound guided paracentesis yielding 3 liters of ascites.  Read by Brayton El PA-C   Original Report Authenticated By: Malachy Moan, M.D.    Medications:      . aspirin EC  81 mg  Oral Daily  . carvedilol  3.125 mg Oral BID WC  . cefTAZidime (FORTAZ)  IV  2 g Intravenous Q T,Th,Sat-1800  . darbepoetin  100 mcg Intravenous Q Sat-HD  . docusate sodium  100 mg Oral QHS  . feeding supplement (NEPRO CARB STEADY)  237 mL Oral BID WC  . hydrALAZINE  25 mg Oral Q8H  . HYDROmorphone  4 mg Oral Q4H  . iron dextran (INFED/DEXFERRUM) infusion  100 mg Intravenous Q T,Th,Sa-HD  . isosorbide mononitrate  30 mg Oral Daily  . magnesium oxide  400 mg Oral Daily  . methadone  10 mg Oral TID  . micafungin [facility]) IV  100 mg Intravenous Daily  . multivitamin  1 tablet Oral QHS  . pantoprazole  40 mg Oral Daily  . ranolazine  500 mg Oral Daily  . sevelamer  400 mg Oral Q breakfast  . simvastatin  10 mg Oral q1800  . sodium chloride  3 mL Intravenous Q12H  . vancomycin  500 mg Intravenous Q T,Th,Sa-HD   I  have reviewed scheduled and prn medications.  Physical  Exam:  General: Alert,  On hd, NAD Appropriate  Heart: RRR, 1/6 sem lsb, no rub, Left chest AICD  Lungs: CTA bilat.  Abdomen: BS +=, less distention. nontender, With small Ascites  Extremities: Dialysis Access: 1 +,bipedal edema / Right ij perm cath patent on hd   Outpatient HD- [facility] On TTS, 4 hrs, Edw= 53.5 kg, heparin , EPO 1500 units q hd, no vit d last pth=178.2 (05/05/12) Bath= 3.0 k, 2.5 Ca, 1.0 mg Was on VAncomycin q hd and Fortaz 2gm qhd since 12/14./13 TFS 13% Iron load order X 10 x 06/13/12 ( needing 7 more doses )   Problem/Plan:  1. Abdominal Pain With Chronic Pancreatitis, question pancreatic abcess; s/p IR aspiration 1/14- gram stain showing yeast, cx pend, Dr. Ninetta Lights to follow in ID clinic when dc,  Awaiting Isolate Yeast Organism per ID/on micafungin/vanc/fortaz 2. Ascites, s/p therapeutic paracentesis 3L 1/17 3. ESRD, cont tts HD 4. HD Access-  Scheduled at [facility] med center for new R avgg Dr. Hollace Hayward 07/13/12 5. Anemia - hgb 9.6 > 9.1>8.9 On aranesp 100,with her lower Value related to her missing epo doses and infection/ needing 7 more doses infed 6. Secondary hyperparathyroidism - Ca high and phos low; holding Renvela, no vit D and low Ca bath w HD 7. HTN/volume - wt 53  Today /edw =53 /still has pedal edema Also, Related to her low alb and liver dz state also. Tolerating uf today so far, UF 3 kg as tolerated today with HD. Have decreased Coreg 6.25 to 3.125mg  as op center recently for bp control on hd/ also on Imdur 30 mg and on Hydralazine 25 mg bid/ taper back to 10mg  bid 8. 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 CM( 10 % EF) / AICD 10. DNR   Lenny Pastel, PA-C [facility] [phone] 07/02/2012,9:53 AM  LOS: 7 days   Patient seen and examined and above reviewed.  Agree with assessment and plan as above. Vinson Moselle  MD [facility] [phone] pgr    [phone]  cell 07/02/2012, 11:41 AM

## 2012-07-02 NOTE — Procedures (Signed)
I was present at this dialysis session. I have reviewed the session itself and made appropriate changes.   Rob Terrence Pizana, MD [facility] 07/02/2012, 8:48 AM   

## 2012-07-02 NOTE — Progress Notes (Signed)
[facility] PROGRESS NOTE  [name] ZOX:[id number] DOB: January 15, 1951 DOA: 06/25/2012 PCP: Jyl Heinz, MD  Assessment/Plan:  1-Abdominal pain: Patient with history of complex infected pseudoocyst with pancreatic necrosis last admission 05-22-2012. She was treated with IV antibiotics ceftazidime, and Vancomycin until 06/07/12. Suppose to have CT scan 06-10-2012. She has history of chronic pancreatitis. She presents with worsening abdominal pain. 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 Ascites. Abnormal hepatic parenchymal perfusion with diffuse portal venous thrombosis. Changes have progressed since previous study.  -Surgery consulted. 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 Case manager informed on 07/02/12   2. ESRD: Continue with Dialysis. Correction of electrolytes during dialysis. No complaint with dialysis, missed last 2 session prior to admission.  3-Ischemic cardiomyopathy status [employer] pacemaker/AICD with EF 10% in 04/2012: Continue with Zocor, IMDUR, Coreg, aspirin.  4-Hyperkalemia: Correction with dialysis.  5-Weakness: PT, OT . Neuro exam non focal.    Code Status: full. Family Communication: Care discussed with patient.  Disposition Plan: home    Consultants:  Nephrologist.   ID  Procedures:  None.  Antibiotics:  Vanc 1/15  Fortaz 1/15   Micafungin  1/15  HPI/Subjective: Seen during HD    Objective: Filed Vitals:   07/02/12 1100 07/02/12 1120 07/02/12 1147 07/02/12 1316  BP: 115/80 110/80 111/73 113/76  Pulse: 80 79 80 86    Temp:  96.7 F (35.9 C) 97.8 F (36.6 C) 99.1 F (37.3 C)  TempSrc:  Oral    Resp: 18 18 17 18   Height:      Weight:      SpO2:  96% 99% 100%   Patient Vitals for the past 24 hrs:  BP Temp Temp src Pulse Resp SpO2 Weight  07/02/12 1316 113/76 mmHg 99.1 F (37.3 C) - 86  18  100 % -  07/02/12 1147 111/73 mmHg 97.8 F (36.6 C) - 80  17  99 % -  07/02/12 1120 110/80 mmHg 96.7 F (35.9 C) Oral 79  18  96 % -  07/02/12 1100 115/80 mmHg - - 80  18  - -  07/02/12 1030 103/77 mmHg - - 84  18  - -  07/02/12 1000 107/75 mmHg - - 76  16  - -  07/02/12 0930 107/74 mmHg - - 73  16  - -  07/02/12 0900 118/62 mmHg - - 76  18  - -  07/02/12 0830 121/84 mmHg - - 90  18  - -  07/02/12 0814 114/78 mmHg - - 83  18  - -  07/02/12 0745 113/80 mmHg - - 81  16  - -  07/02/12 0720 114/80 mmHg - - 86  16  - -  07/02/12 0713 121/79 mmHg - - 89  18  - -  07/02/12 0659 116/84 mmHg 97.4 F (36.3 C) Oral 87  16  97 % 53.5 kg (117 lb 15.1 oz)  07/02/12 0509 109/74 mmHg 98.2 F (36.8 C) Oral 83  16  96 % -  07/01/12 2037 117/77 mmHg 98.1 F (36.7 C) Oral 92  18  96 % 52.3 kg (115 lb  4.8 oz)  07/01/12 1800 121/78 mmHg 98.1 F (36.7 C) Oral 89  18  95 % -     Intake/Output Summary (Last 24 hours) at 07/02/12 1533 Last data filed at 07/02/12 1316  Gross per 24 hour  Intake    460 ml  Output   2999 ml  Net  -2539 ml   Filed Weights   06/30/12 1750 07/01/12 2037 07/02/12 0659  Weight: 54.9 kg (121 lb 0.5 oz) 52.3 kg (115 lb 4.8 oz) 53.5 kg (117 lb 15.1 oz)    Exam:   General:  No distress.  Cardiovascular: S 1, S 2, RRR.   Respiratory: CTA.  Abdomen: BS present, soft, decrease distension   Data Reviewed: Basic Metabolic Panel:  Lab 07/02/12 1610 06/30/12 1430 06/28/12 0659 06/28/12 0500 06/26/12 0602 06/25/12 2328 06/25/12 1726  NA 128* 130* -- 132* 132* -- 130*  K 3.6 3.5 -- 4.3 6.0* -- 5.4*  CL 91* 93* -- 94* 96 -- 92*  CO2 28 27 -- 25 21 -- 22  GLUCOSE 87 138* -- 109* 86 -- 64*   BUN 29* 32* -- 36* 67* -- 60*  CREATININE 3.18* 3.51* -- 4.39* 6.62* 6.37* --  CALCIUM 10.6* 10.9* -- 10.5 10.7* -- 11.2*  MG -- -- 2.4 -- -- -- --  PHOS 3.7 4.4 -- 5.2* -- -- --   Liver Function Tests:  Lab 07/02/12 0716 06/30/12 1430 06/28/12 0500 06/25/12 1726  AST -- -- -- 14  ALT -- -- -- 5  ALKPHOS -- -- -- 118*  BILITOT -- -- -- 0.3  PROT -- -- -- 7.3  ALBUMIN 1.7* 1.8* 1.9* 2.0*    Lab 06/25/12 1846  LIPASE 8*  AMYLASE --   No results found for this basename: AMMONIA:5 in the last 168 hours CBC:  Lab 07/02/12 0716 06/30/12 1430 06/28/12 0659 06/26/12 0602 06/25/12 2328 06/25/12 1726  WBC 10.2 11.2* 9.4 9.5 10.3 --  NEUTROABS -- -- -- -- -- 7.2  HGB 8.9* 8.6* 9.1* 9.6* 10.2* --  HCT 29.0* 27.2* 29.1* 31.1* 33.2* --  MCV 90.1 88.9 89.5 89.1 88.5 --  PLT 161 159 166 219 232 --   Cardiac Enzymes:  Lab 06/26/12 1432 06/26/12 0602 06/25/12 2329  CKTOTAL -- -- --  CKMB -- -- --  CKMBINDEX -- -- --  TROPONINI <0.30 <0.30 <0.30   BNP (last 3 results)  Basename 06/02/12 1944 05/08/12 0515 04/21/12 1419  PROBNP >70000.0* >70000.0* >70000.0*   CBG:  Lab 06/25/12 2315  GLUCAP 81    Recent Results (from the past 240 hour(s))  MRSA PCR SCREENING     Status: Normal   Collection Time   06/26/12  3:20 AM      Component Value Range Status Comment   MRSA by PCR NEGATIVE  NEGATIVE Final   URINE CULTURE     Status: Normal   Collection Time   06/27/12  6:36 AM      Component Value Range Status Comment   Specimen Description URINE, RANDOM   Final    Special Requests NONE   Final    Culture  Setup Time 06/27/2012 16:26   Final    Colony Count 40,000 COLONIES/ML   Final    Culture     Final    Value: Multiple bacterial morphotypes present, none predominant. Suggest appropriate recollection if clinically indicated.   Report Status 06/28/2012 FINAL   Final   CULTURE, ROUTINE-ABSCESS     Status: Normal (Preliminary result)  Collection Time   06/28/12  4:26 PM       Component Value Range Status Comment   Specimen Description ABSCESS   Final    Special Requests LEFT RETROPERITONEAL FLUID   Final    Gram Stain     Final    Value: ABUNDANT WBC PRESENT,BOTH PMN AND MONONUCLEAR     FEW YEAST   Culture Culture reincubated for better growth   Final    Report Status PENDING   Incomplete   ANAEROBIC CULTURE     Status: Normal (Preliminary result)   Collection Time   06/28/12  4:26 PM      Component Value Range Status Comment   Specimen Description ABSCESS   Final    Special Requests LEFT RETROPERITONEAL FLUID   Final    Gram Stain     Final    Value: ABUNDANT WBC PRESENT,BOTH PMN AND MONONUCLEAR     FEW YEAST   Culture     Final    Value: NO ANAEROBES ISOLATED; CULTURE IN PROGRESS FOR 5 DAYS   Report Status PENDING   Incomplete   FUNGUS CULTURE W SMEAR     Status: Normal (Preliminary result)   Collection Time   06/28/12  4:26 PM      Component Value Range Status Comment   Specimen Description ABSCESS   Final    Special Requests LEFT RETROPERITONEAL FLUID   Final    Fungal Smear NO YEAST OR FUNGAL ELEMENTS SEEN   Final    Culture CULTURE IN PROGRESS FOR FOUR WEEKS   Final    Report Status PENDING   Incomplete      Studies: US Paracentesis  07/01/2012  *RADIOLOGY REPORT*  Clinical Data: Abdominal pain secondary to pancreatitis with pseudocyst, ascites  ULTRASOUND GUIDED PARACENTESIS  Comparison:  None  An ultrasound guided paracentesis was thoroughly discussed with the patient and questions answered.  The benefits, risks, alternatives and complications were also discussed.  The patient understands and wishes to proceed with the procedure.  Written consent was obtained.  Ultrasound was performed to localize and mark an adequate pocket of fluid in the left lower quadrant of the abdomen.  The area was then prepped and draped in the normal sterile fashion.  1% Lidocaine was used for local anesthesia.  Under ultrasound guidance a 19 gauge Yueh catheter was  introduced.  Paracentesis was performed.  The catheter was removed and a dressing applied.  Complications:  None  Findings:  A total of approximately 3 liters of amber colored fluid was removed.  A fluid sample was not sent for laboratory analysis.  IMPRESSION: Successful ultrasound guided paracentesis yielding 3 liters of ascites.  Read by Brayton El PA-C   Original Report Authenticated By: Malachy Moan, M.D.     Scheduled Meds:    . aspirin EC  81 mg Oral Daily  . carvedilol  3.125 mg Oral BID WC  . cefTAZidime (FORTAZ)  IV  2 g Intravenous Q T,Th,Sat-1800  . darbepoetin  100 mcg Intravenous Q Sat-HD  . docusate sodium  100 mg Oral QHS  . feeding supplement (NEPRO CARB STEADY)  237 mL Oral BID WC  . hydrALAZINE  10 mg Oral Q8H  . HYDROmorphone  4 mg Oral Q4H  . iron dextran (INFED/DEXFERRUM) infusion  100 mg Intravenous Q T,Th,Sa-HD  . isosorbide mononitrate  30 mg Oral Daily  . magnesium oxide  400 mg Oral Daily  . methadone  10 mg Oral TID  . micafungin [facility])  IV  100 mg Intravenous Daily  . multivitamin  1 tablet Oral QHS  . pantoprazole  40 mg Oral Daily  . ranolazine  500 mg Oral Daily  . sevelamer  400 mg Oral Q breakfast  . simvastatin  10 mg Oral q1800  . sodium chloride  3 mL Intravenous Q12H  . vancomycin  500 mg Intravenous Q T,Th,Sa-HD   Continuous Infusions:   Active Problems:  Ischemic cardiomyopathy, EF 20-25% echo April 2013, now 10% by cath (05/06/12)  Chronic systolic congestive heart failure, NYHA class 3  LBBB (left bundle branch block)  Hypertension  Pancreatic pseudocyst  Pancreatitis, chronic  Thrombocytopenia  Anemia in chronic kidney disease (CKD)  Esophageal varices in cirrhosis, admitted June 2013 to [facility]  History of Portal vein thrombosis  Ascites  ESRD on hemodialysis  CAD, CABG X 4 Feb '08, LAD stent June '08  ICD (implantable cardiac defibrillator) in place, MDT placed Jan 2013 [facility]  Pancreatic  necrosis       [name]  [facility] Pager [phone]. If 8PM-8AM, please contact night-coverage at [email or web address], password [facility] 07/02/2012, 3:33 PM  LOS: 7 days

## 2012-07-02 NOTE — Progress Notes (Signed)
[facility] and will be closed on Monday, 07/04/2012. NCM will follow up with [facility] for medication on 1/21. Isidoro Donning RN CCM Case Mgmt phone [phone]

## 2012-07-03 LAB — ANAEROBIC CULTURE

## 2012-07-03 NOTE — Progress Notes (Signed)
[id number] Dr. Lavera Guise aware of outdated PIV; pt refuses to change PIV  due to hard stick ; site is clean  and dry no signs of phlebitis noted; MD said ok to leave PIV.

## 2012-07-03 NOTE — Progress Notes (Signed)
[facility] PROGRESS NOTE  [name]:[id number] DOB: January 27, 1951 DOA: 06/25/2012 PCP: Jyl Heinz, MD  Assessment/Plan:  1-Abdominal pain: Patient with history of complex infected pseudoocyst with pancreatic necrosis last admission 05-22-2012. She was treated with IV antibiotics ceftazidime, and Vancomycin until 06/07/12. Suppose to have CT scan 06-10-2012. She has history of chronic pancreatitis. She presents with worsening abdominal pain. 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 Ascites. Abnormal hepatic parenchymal perfusion with diffuse portal venous thrombosis. Changes have progressed since previous study.  -Surgery consulted. 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 Case manager informed on 07/02/12   2. ESRD: Continue with Dialysis. Correction of electrolytes during dialysis. No complaint with dialysis, missed last 2 session prior to admission.  3-Ischemic cardiomyopathy status [employer] pacemaker/AICD with EF 10% in 04/2012: Continue with Zocor, IMDUR, Coreg, aspirin.  4-Hyperkalemia: Correction with dialysis.  5-Weakness: PT, OT . Neuro exam non focal.    Code Status: full. Family Communication: Care discussed with patient.  Disposition Plan: home    Consultants:  Nephrologist.   ID  Procedures:  None.  Antibiotics:  Vanc 1/15  Fortaz 1/15   Micafungin  1/15  HPI/Subjective: No new events    Objective: Filed Vitals:   07/02/12 1316 07/02/12 1658 07/02/12 2050 07/03/12 0530  BP: 113/76 109/73 96/73 115/54  Pulse: 86 80 79 86  Temp:  99.1 F (37.3 C) 98.9 F (37.2 C) 97.7 F (36.5 C) 97.7 F (36.5 C)  TempSrc:   Oral Oral  Resp: 18 16 18 18   Height:      Weight:   50.3 kg (110 lb 14.3 oz)   SpO2: 100% 100% 100% 100%   Patient Vitals for the past 24 hrs:  BP Temp Temp src Pulse Resp SpO2 Weight  07/03/12 0530 115/54 mmHg 97.7 F (36.5 C) Oral 86  18  100 % -  07/02/12 2050 96/73 mmHg 97.7 F (36.5 C) Oral 79  18  100 % 50.3 kg (110 lb 14.3 oz)  07/02/12 1658 109/73 mmHg 98.9 F (37.2 C) - 80  16  100 % -  07/02/12 1316 113/76 mmHg 99.1 F (37.3 C) - 86  18  100 % -  07/02/12 1147 111/73 mmHg 97.8 F (36.6 C) - 80  17  99 % -  07/02/12 1120 110/80 mmHg 96.7 F (35.9 C) Oral 79  18  96 % -  07/02/12 1100 115/80 mmHg - - 80  18  - -  07/02/12 1030 103/77 mmHg - - 84  18  - -  07/02/12 1000 107/75 mmHg - - 76  16  - -  07/02/12 0930 107/74 mmHg - - 73  16  - -  07/02/12 0900 118/62 mmHg - - 76  18  - -     Intake/Output Summary (Last 24 hours) at 07/03/12 0843 Last data filed at 07/03/12 0700  Gross per 24 hour  Intake   1440 ml  Output   2999 ml  Net  -1559 ml   Filed Weights   07/01/12 2037 07/02/12 0659 07/02/12 2050  Weight: 52.3 kg (115 lb 4.8 oz) 53.5  kg (117 lb 15.1 oz) 50.3 kg (110 lb 14.3 oz)    Exam:   General:  No distress.  Cardiovascular: S 1, S 2, RRR.   Respiratory: CTA.  Abdomen: BS present, soft, decrease distension   Data Reviewed: Basic Metabolic Panel:  Lab 07/02/12 4098 06/30/12 1430 06/28/12 0659 06/28/12 0500  NA 128* 130* -- 132*  K 3.6 3.5 -- 4.3  CL 91* 93* -- 94*  CO2 28 27 -- 25  GLUCOSE 87 138* -- 109*  BUN 29* 32* -- 36*  CREATININE 3.18* 3.51* -- 4.39*  CALCIUM 10.6* 10.9* -- 10.5  MG -- -- 2.4 --  PHOS 3.7 4.4 -- 5.2*   Liver Function Tests:  Lab 07/02/12 0716 06/30/12 1430 06/28/12 0500  AST -- -- --  ALT -- -- --  ALKPHOS -- -- --  BILITOT -- -- --  PROT -- -- --  ALBUMIN 1.7* 1.8* 1.9*   No results found for this basename:  LIPASE:5,AMYLASE:5 in the last 168 hours No results found for this basename: AMMONIA:5 in the last 168 hours CBC:  Lab 07/02/12 0716 06/30/12 1430 06/28/12 0659  WBC 10.2 11.2* 9.4  NEUTROABS -- -- --  HGB 8.9* 8.6* 9.1*  HCT 29.0* 27.2* 29.1*  MCV 90.1 88.9 89.5  PLT 161 159 166   Cardiac Enzymes:  Lab 06/26/12 1432  CKTOTAL --  CKMB --  CKMBINDEX --  TROPONINI <0.30   BNP (last 3 results)  Basename 06/02/12 1944 05/08/12 0515 04/21/12 1419  PROBNP >70000.0* >70000.0* >70000.0*   CBG: No results found for this basename: GLUCAP:5 in the last 168 hours  Recent Results (from the past 240 hour(s))  MRSA PCR SCREENING     Status: Normal   Collection Time   06/26/12  3:20 AM      Component Value Range Status Comment   MRSA by PCR NEGATIVE  NEGATIVE Final   URINE CULTURE     Status: Normal   Collection Time   06/27/12  6:36 AM      Component Value Range Status Comment   Specimen Description URINE, RANDOM   Final    Special Requests NONE   Final    Culture  Setup Time 06/27/2012 16:26   Final    Colony Count 40,000 COLONIES/ML   Final    Culture     Final    Value: Multiple bacterial morphotypes present, none predominant. Suggest appropriate recollection if clinically indicated.   Report Status 06/28/2012 FINAL   Final   CULTURE, ROUTINE-ABSCESS     Status: Normal (Preliminary result)   Collection Time   06/28/12  4:26 PM      Component Value Range Status Comment   Specimen Description ABSCESS   Final    Special Requests LEFT RETROPERITONEAL FLUID   Final    Gram Stain     Final    Value: ABUNDANT WBC PRESENT,BOTH PMN AND MONONUCLEAR     FEW YEAST   Culture Culture reincubated for better growth   Final    Report Status PENDING   Incomplete   ANAEROBIC CULTURE     Status: Normal (Preliminary result)   Collection Time   06/28/12  4:26 PM      Component Value Range Status Comment   Specimen Description ABSCESS   Final    Special Requests LEFT RETROPERITONEAL FLUID    Final    Gram Stain     Final    Value: ABUNDANT WBC PRESENT,BOTH PMN AND MONONUCLEAR  FEW YEAST   Culture     Final    Value: NO ANAEROBES ISOLATED; CULTURE IN PROGRESS FOR 5 DAYS   Report Status PENDING   Incomplete   FUNGUS CULTURE W SMEAR     Status: Normal (Preliminary result)   Collection Time   06/28/12  4:26 PM      Component Value Range Status Comment   Specimen Description ABSCESS   Final    Special Requests LEFT RETROPERITONEAL FLUID   Final    Fungal Smear NO YEAST OR FUNGAL ELEMENTS SEEN   Final    Culture CULTURE IN PROGRESS FOR FOUR WEEKS   Final    Report Status PENDING   Incomplete      Studies: US Paracentesis  07/01/2012  *RADIOLOGY REPORT*  Clinical Data: Abdominal pain secondary to pancreatitis with pseudocyst, ascites  ULTRASOUND GUIDED PARACENTESIS  Comparison:  None  An ultrasound guided paracentesis was thoroughly discussed with the patient and questions answered.  The benefits, risks, alternatives and complications were also discussed.  The patient understands and wishes to proceed with the procedure.  Written consent was obtained.  Ultrasound was performed to localize and mark an adequate pocket of fluid in the left lower quadrant of the abdomen.  The area was then prepped and draped in the normal sterile fashion.  1% Lidocaine was used for local anesthesia.  Under ultrasound guidance a 19 gauge Yueh catheter was introduced.  Paracentesis was performed.  The catheter was removed and a dressing applied.  Complications:  None  Findings:  A total of approximately 3 liters of amber colored fluid was removed.  A fluid sample was not sent for laboratory analysis.  IMPRESSION: Successful ultrasound guided paracentesis yielding 3 liters of ascites.  Read by Brayton El PA-C   Original Report Authenticated By: Malachy Moan, M.D.     Scheduled Meds:    . aspirin EC  81 mg Oral Daily  . carvedilol  3.125 mg Oral BID WC  . cefTAZidime (FORTAZ)  IV  2 g Intravenous Q  T,Th,Sat-1800  . darbepoetin  100 mcg Intravenous Q Sat-HD  . docusate sodium  100 mg Oral QHS  . feeding supplement (NEPRO CARB STEADY)  237 mL Oral BID WC  . hydrALAZINE  10 mg Oral Q8H  . HYDROmorphone  4 mg Oral Q4H  . iron dextran (INFED/DEXFERRUM) infusion  100 mg Intravenous Q T,Th,Sa-HD  . isosorbide mononitrate  30 mg Oral Daily  . magnesium oxide  400 mg Oral Daily  . methadone  10 mg Oral TID  . micafungin [facility]) IV  100 mg Intravenous Daily  . multivitamin  1 tablet Oral QHS  . pantoprazole  40 mg Oral Daily  . ranolazine  500 mg Oral Daily  . sevelamer  400 mg Oral Q breakfast  . simvastatin  10 mg Oral q1800  . sodium chloride  3 mL Intravenous Q12H  . vancomycin  500 mg Intravenous Q T,Th,Sa-HD   Continuous Infusions:   Active Problems:  Ischemic cardiomyopathy, EF 20-25% echo April 2013, now 10% by cath (05/06/12)  Chronic systolic congestive heart failure, NYHA class 3  LBBB (left bundle branch block)  Hypertension  Pancreatic pseudocyst  Pancreatitis, chronic  Thrombocytopenia  Anemia in chronic kidney disease (CKD)  Esophageal varices in cirrhosis, admitted June 2013 to [facility]  History of Portal vein thrombosis  Ascites  ESRD on hemodialysis  CAD, CABG X 4 Feb '08, LAD stent June '08  ICD (implantable cardiac defibrillator) in place, MDT  placed Jan 2013 [facility]  Pancreatic necrosis       Dashanae Longfield  [facility] Pager [phone]. If 8PM-8AM, please contact night-coverage at [email or web address], password [email or web address] 07/03/2012, 8:43 AM  LOS: 8 days

## 2012-07-03 NOTE — Progress Notes (Signed)
Subjective:   Still requires pain med  But able to eat lunch  , sitting in chair , Awaiting [facility]  Input to pay for anti fungal med. Also  She tells me she lives closer to [facility]  And would like to Change kidney centers  When able. Objective Vital signs in last 24 hours: Filed Vitals:   07/02/12 1658 07/02/12 2050 07/03/12 0530 07/03/12 1000  BP: 109/73 96/73 115/54 106/73  Pulse: 80 79 86 73  Temp: 98.9 F (37.2 C) 97.7 F (36.5 C) 97.7 F (36.5 C) 97.3 F (36.3 C)  TempSrc:  Oral Oral Oral  Resp: 16 18 18 18   Height:      Weight:  50.3 kg (110 lb 14.3 oz)    SpO2: 100% 100% 100% 100%   Weight change: -2 kg (-4 lb 6.6 oz)  Intake/Output Summary (Last 24 hours) at 07/03/12 1235 Last data filed at 07/03/12 0900  Gross per 24 hour  Intake   1680 ml  Output      0 ml  Net   1680 ml   Labs: Basic Metabolic Panel:  Lab 07/02/12 1610 06/30/12 1430 06/28/12 0500  NA 128* 130* 132*  K 3.6 3.5 4.3  CL 91* 93* 94*  CO2 28 27 25   GLUCOSE 87 138* 109*  BUN 29* 32* 36*  CREATININE 3.18* 3.51* 4.39*  CALCIUM 10.6* 10.9* 10.5  ALB -- -- --  PHOS 3.7 4.4 5.2*   Liver Function Tests:  Lab 07/02/12 0716 06/30/12 1430 06/28/12 0500  AST -- -- --  ALT -- -- --  ALKPHOS -- -- --  BILITOT -- -- --  PROT -- -- --  ALBUMIN 1.7* 1.8* 1.9*   No results found for this basename: LIPASE:3,AMYLASE:3 in the last 168 hours No results found for this basename: AMMONIA:3 in the last 168 hours CBC:  Lab 07/02/12 0716 06/30/12 1430 06/28/12 0659  WBC 10.2 11.2* 9.4  NEUTROABS -- -- --  HGB 8.9* 8.6* 9.1*  HCT 29.0* 27.2* 29.1*  MCV 90.1 88.9 89.5  PLT 161 159 166   Cardiac Enzymes:  Lab 06/26/12 1432  CKTOTAL --  CKMB --  CKMBINDEX --  TROPONINI <0.30   CBG: No results found for this basename: GLUCAP:5 in the last 168 hours  Iron Studies: No results found for this basename: IRON,TIBC,TRANSFERRIN,FERRITIN in the last 72 hours Studies/Results: No results  found. Medications:      . aspirin EC  81 mg Oral Daily  . carvedilol  3.125 mg Oral BID WC  . cefTAZidime (FORTAZ)  IV  2 g Intravenous Q T,Th,Sat-1800  . darbepoetin  100 mcg Intravenous Q Sat-HD  . docusate sodium  100 mg Oral QHS  . feeding supplement (NEPRO CARB STEADY)  237 mL Oral BID WC  . hydrALAZINE  10 mg Oral Q8H  . HYDROmorphone  4 mg Oral Q4H  . iron dextran (INFED/DEXFERRUM) infusion  100 mg Intravenous Q T,Th,Sa-HD  . isosorbide mononitrate  30 mg Oral Daily  . magnesium oxide  400 mg Oral Daily  . methadone  10 mg Oral TID  . micafungin [facility]) IV  100 mg Intravenous Daily  . multivitamin  1 tablet Oral QHS  . pantoprazole  40 mg Oral Daily  . ranolazine  500 mg Oral Daily  . sevelamer  400 mg Oral Q breakfast  . simvastatin  10 mg Oral q1800  . sodium chloride  3 mL Intravenous Q12H  . vancomycin  500 mg  Intravenous Q T,Th,Sa-HD   I  have reviewed scheduled and prn medications.  Physical Exam:  General: Alert,  NAD, Appropriate  Heart: RRR, 1/6 sem lsb, no rub, Left chest AICD  Lungs: CTA bilat.  Abdomen: BS +=, less distention since paracentesis, nontender, With small Ascites  Extremities: Dialysis Access: 1 +,bipedal edema / Right ij perm cath patent    Outpatient HD- [facility] On TTS, 4 hrs, Edw= 53.5 kg, heparin , EPO 1500 units q hd, no vit d last pth=178.2 (05/05/12) Bath= 3.0 k, 2.5 Ca, 1.0 mg Was on VAncomycin q hd and Fortaz 2gm qhd since 12/14./13 TFS 13% Iron load order X 10 x 06/13/12 ( needing 7 more doses )   Problem/Plan:  1. Abdominal Pain With Chronic Pancreatitis, question pancreatic abcess; s/p IR aspiration 1/14- gram stain showing yeast, cx pend, Dr. Ninetta Lights to follow in ID clinic when dc, Awaiting Isolate Yeast Organism per ID/on micafungin/vanc/fortaz and Availability for [facility] to pay for med (po voriconazole). 2. Ascites, s/p therapeutic paracentesis 3L 1/17 3. ESRD, cont tts HD yesterday  Wt to 50.3  Lower edw. WANTS to change  to closer kidney center after dc to [facility]  ,need to check if able to take new pts. monday 4. HD Access- Scheduled at [facility] med center for new R avgg Dr. Hollace Hayward 07/13/12 5. Anemia - hgb 9.6 > 9.1>8.9 On aranesp 100,with her lower Value related to her missing epo doses when she was admitted and infection/ needing 7 more doses infed 6. Secondary hyperparathyroidism - Ca high10.6( Corrected Ca=12.9) and phos 3.7; holding Renvela, no vit D and low Ca bath w HD 7. HTN/volume - wt as above 3.  2999 ml uf yesterday and tolerated /still has pedal edema Also, Related to her low alb and liver dz state also. Tolerating hd uf so far, continue to challenge on hd. Have decreased Coreg 6.25 to 3.125mg  as op center recently for bp control on hd/ also on Imdur 30 mg and on Hydralazine 25 mg bid/ taper back to 10mg  bid yesterday 8. 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 CM( 10 % EF) / AICD 10. DNR  Lenny Pastel, PA-C [facility] [phone] 07/03/2012,12:35 PM  LOS: 8 days   Patient seen and examined and above reviewed.  Agree with assessment and plan as above. Vinson Moselle  MD [employer] [phone] pgr    [phone] cell 07/03/2012, 9:22 PM

## 2012-07-04 DIAGNOSIS — B379 Candidiasis, unspecified: Secondary | ICD-10-CM

## 2012-07-04 DIAGNOSIS — B952 Enterococcus as the cause of diseases classified elsewhere: Secondary | ICD-10-CM

## 2012-07-04 DIAGNOSIS — K859 Acute pancreatitis without necrosis or infection, unspecified: Secondary | ICD-10-CM

## 2012-07-04 MED ORDER — DEXTROSE 5 % IV SOLN
2.0000 g | INTRAVENOUS | Status: DC
  Administered 2012-07-05: 2 g via INTRAVENOUS
  Filled 2012-07-04 (×2): qty 2

## 2012-07-04 NOTE — Progress Notes (Signed)
ANTIBIOTIC CONSULT NOTE - FOLLOW UP  Pharmacy Consult for Ceftazidime Indication: Pancreatic abscess   Allergies  Allergen Reactions  . Codeine Itching  . Morphine And Related Shortness Of Breath    SOB when given IV once "to me it was mild; I called out fast for help; never had to intubate" (05/23/2012)  . Ace Inhibitors Other (See Comments)    Unknown reaction but her physician stated that she can't take it (specifically lisinopril)  . Tylenol (Acetaminophen) Other (See Comments)    Patient states that the doctor told her she has a Fatty Liver.    Patient Measurements: Height: 5\' 4"  (162.6 cm) Weight: 114 lb 3.2 oz (51.8 kg) IBW/kg (Calculated) : 54.7   Vital Signs: Temp: 98 F (36.7 C) (01/20 0930) Temp src: Oral (01/20 0930) BP: 112/74 mmHg (01/20 0930) Pulse Rate: 80  (01/20 0930) Intake/Output from previous day: 01/19 0701 - 01/20 0700 In: 1740 [P.O.:1560; I.V.:80; IV Piggyback:100] Out: -  Intake/Output from this shift: Total I/O In: 120 [P.O.:120] Out: -   Labs:  Basename 07/02/12 0716  WBC 10.2  HGB 8.9*  PLT 161  LABCREA --  CREATININE 3.18*   Estimated Creatinine Clearance: 15 ml/min (by C-G formula based on Cr of 3.18). No results found for this basename: VANCOTROUGH:2,VANCOPEAK:2,VANCORANDOM:2,GENTTROUGH:2,GENTPEAK:2,GENTRANDOM:2,TOBRATROUGH:2,TOBRAPEAK:2,TOBRARND:2,AMIKACINPEAK:2,AMIKACINTROU:2,AMIKACIN:2, in the last 72 hours   Microbiology: Recent Results (from the past 720 hour(s))  MRSA PCR SCREENING     Status: Normal   Collection Time   06/26/12  3:20 AM      Component Value Range Status Comment   MRSA by PCR NEGATIVE  NEGATIVE Final   URINE CULTURE     Status: Normal   Collection Time   06/27/12  6:36 AM      Component Value Range Status Comment   Specimen Description URINE, RANDOM   Final    Special Requests NONE   Final    Culture  Setup Time 06/27/2012 16:26   Final    Colony Count 40,000 COLONIES/ML   Final    Culture     Final      Value: Multiple bacterial morphotypes present, none predominant. Suggest appropriate recollection if clinically indicated.   Report Status 06/28/2012 FINAL   Final   CULTURE, ROUTINE-ABSCESS     Status: Normal (Preliminary result)   Collection Time   06/28/12  4:26 PM      Component Value Range Status Comment   Specimen Description ABSCESS   Final    Special Requests LEFT RETROPERITONEAL FLUID   Final    Gram Stain     Final    Value: ABUNDANT WBC PRESENT,BOTH PMN AND MONONUCLEAR     FEW YEAST   Culture     Final    Value: FEW ENTEROCOCCUS SPECIES     FEW YEAST CONSISTENT WITH CANDIDA SPECIES   Report Status PENDING   Incomplete   ANAEROBIC CULTURE     Status: Normal   Collection Time   06/28/12  4:26 PM      Component Value Range Status Comment   Specimen Description ABSCESS   Final    Special Requests LEFT RETROPERITONEAL FLUID   Final    Gram Stain     Final    Value: ABUNDANT WBC PRESENT,BOTH PMN AND MONONUCLEAR     FEW YEAST   Culture NO ANAEROBES ISOLATED   Final    Report Status 07/03/2012 FINAL   Final   FUNGUS CULTURE W SMEAR     Status: Normal (Preliminary result)  Collection Time   06/28/12  4:26 PM      Component Value Range Status Comment   Specimen Description ABSCESS   Final    Special Requests LEFT RETROPERITONEAL FLUID   Final    Fungal Smear NO YEAST OR FUNGAL ELEMENTS SEEN   Final    Culture YEAST ISOLATED;ID TO FOLLOW   Final    Report Status PENDING   Incomplete     Anti-infectives     Start     Dose/Rate Route Frequency Ordered Stop   07/05/12 1800   cefTAZidime (FORTAZ) 2 g in dextrose 5 % 50 mL IVPB        2 g 100 mL/hr over 30 Minutes Intravenous Every T-Th-Sa (1800) 07/04/12 1524     07/02/12 0000   dextrose 5 % SOLN 50 mL with cefTAZidime 2 G SOLR        100 mL/hr over 30 Minutes   07/02/12 1031     07/02/12 0000   sodium chloride 0.9 % SOLN 100 mL with vancomycin 500 MG SOLR        100 mL/hr over 60 Minutes   07/02/12 1031     07/02/12  0000   voriconazole (VFEND) 200 MG tablet        200 mg Oral Daily 07/02/12 1031     06/30/12 1800   cefTAZidime (FORTAZ) 2 g in dextrose 5 % 50 mL IVPB  Status:  Discontinued        2 g 100 mL/hr over 30 Minutes Intravenous Every T-Th-Sa (1800) 06/29/12 1831 07/04/12 1244   06/30/12 1200   vancomycin (VANCOCIN) 500 mg in sodium chloride 0.9 % 100 mL IVPB        500 mg 100 mL/hr over 60 Minutes Intravenous Every T-Th-Sa (Hemodialysis) 06/29/12 1831     06/29/12 2000   vancomycin (VANCOCIN) 1,250 mg in sodium chloride 0.9 % 250 mL IVPB        1,250 mg 166.7 mL/hr over 90 Minutes Intravenous  Once 06/29/12 1831 06/30/12 0029   06/29/12 1930   cefTAZidime (FORTAZ) 1 g in dextrose 5 % 50 mL IVPB        1 g 100 mL/hr over 30 Minutes Intravenous  Once 06/29/12 1831 06/29/12 2049   06/29/12 1900   micafungin (MYCAMINE) 100 mg in sodium chloride 0.9 % 100 mL IVPB        100 mg 100 mL/hr over 1 Hours Intravenous Daily 06/29/12 1757     06/26/12 0100   sulfamethoxazole-trimethoprim (BACTRIM DS) 800-160 MG per tablet 2 tablet  Status:  Discontinued        2 tablet Oral Daily at bedtime 06/26/12 0050 06/29/12 1758   06/25/12 2315   sulfamethoxazole-trimethoprim (BACTRIM DS,SEPTRA DS) 800-160 MG per tablet 1 tablet  Status:  Discontinued        1 tablet Oral 2 times daily 06/25/12 2301 06/26/12 0048          Assessment: 62 y.o F with ESRD who presented to the [facility] on 06/25/12 with abdominal pain. The patient has a PMH significant for chronic/necrotizing pancreatitis with hx pancreatic pseudocyst. IR was consulted and and were able to aspirate the left perisplenic / retroperitoneal fluid and gas collection -- which has been sent for culturing. ID has consulted pharmacy to dose Vancomycin + Ceftazidime along with Micafungin per MD for empiric coverage while awaiting culture results. Cultures thus far show a few enterococcus and yeast.  The patient receives HD on T/Th/Sat. Patient tolerated  a  full HD session on 1/16 and 1/18. Doses remain appropriate at this time of Ceftaz. Noted plans for discharge soon.   Dr. Daiva Eves had cancelled [facility] but reordered to continue for GNR coverage. Will restart at prior dose.   Goal of Therapy:  Pre-HD Vancomycin level of 15-25 mcg/ml  Plan:  1. Continue Ceftazidime 2g IV post HD sessions on T/Th/Sat 2. Will continue to follow HD schedule/duration, culture results, LOT, and antibiotic de-escalation plans   Link Snuffer, PharmD, BCPS Clinical Pharmacist [phone] 07/04/2012 3:25 PM

## 2012-07-04 NOTE — Progress Notes (Signed)
[facility] PROGRESS NOTE  [name] ZOX:[id number] DOB: [birth year] DOA: 06/25/2012 PCP: Jyl Heinz, MD  Assessment/Plan:  1-Abdominal pain: Patient with history of complex infected pseudoocyst with pancreatic necrosis last admission 05-22-2012. She was treated with IV antibiotics ceftazidime, and Vancomycin until 06/07/12. Suppose to have CT scan 06-10-2012. She has history of chronic pancreatitis. She presents with worsening abdominal pain. 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 Ascites. Abnormal hepatic parenchymal perfusion with diffuse portal venous thrombosis. Changes have progressed since previous study.  -Surgery consulted. 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 Case manager informed on 07/02/12   2. ESRD: Continue with Dialysis. Correction of electrolytes during dialysis. No complaint with dialysis, missed last 2 session prior to admission.  3-Ischemic cardiomyopathy status [employer] pacemaker/AICD with EF 10% in 04/2012: Continue with Zocor, IMDUR, Coreg, aspirin.  4-Hyperkalemia: Correction with dialysis.  5-Weakness: PT, OT . Neuro exam non focal.    Code Status: full. Family Communication: Care discussed with patient.  Disposition Plan: home    Consultants:  Nephrologist.   ID  Procedures:  None.  Antibiotics:  Vanc 1/15  Fortaz 1/15   Micafungin  1/15  HPI/Subjective: No new events    Objective: Filed Vitals:   07/03/12 1400 07/03/12 1759 07/03/12 2049 07/04/12 0424  BP: 101/68 110/68 116/76 112/76  Pulse: 82 76 73 83    Temp: 97.2 F (36.2 C) 97.7 F (36.5 C) 97.7 F (36.5 C) 97.6 F (36.4 C)  TempSrc: Oral Oral Oral Oral  Resp: 18 18 18 18   Height:      Weight:   51.8 kg (114 lb 3.2 oz)   SpO2: 98% 100% 98% 98%   Patient Vitals for the past 24 hrs:  BP Temp Temp src Pulse Resp SpO2 Weight  07/04/12 0424 112/76 mmHg 97.6 F (36.4 C) Oral 83  18  98 % -  07/03/12 2049 116/76 mmHg 97.7 F (36.5 C) Oral 73  18  98 % 51.8 kg (114 lb 3.2 oz)  07/03/12 1759 110/68 mmHg 97.7 F (36.5 C) Oral 76  18  100 % -  07/03/12 1400 101/68 mmHg 97.2 F (36.2 C) Oral 82  18  98 % -  07/03/12 1000 106/73 mmHg 97.3 F (36.3 C) Oral 73  18  100 % -     Intake/Output Summary (Last 24 hours) at 07/04/12 0923 Last data filed at 07/04/12 0700  Gross per 24 hour  Intake   1500 ml  Output      0 ml  Net   1500 ml   Filed Weights   07/02/12 0659 07/02/12 2050 07/03/12 2049  Weight: 53.5 kg (117 lb 15.1 oz) 50.3 kg (110 lb 14.3 oz) 51.8 kg (114 lb 3.2 oz)    Exam:   General:  No distress.  Cardiovascular: S 1, S 2, RRR.   Respiratory: CTA.  Abdomen: BS present, soft, decrease distension   Data Reviewed: Basic Metabolic Panel:  Lab 07/02/12 8119 06/30/12 1430 06/28/12 0659 06/28/12 0500  NA 128* 130* -- 132*  K 3.6 3.5 -- 4.3  CL 91* 93* -- 94*  CO2 28  27 -- 25  GLUCOSE 87 138* -- 109*  BUN 29* 32* -- 36*  CREATININE 3.18* 3.51* -- 4.39*  CALCIUM 10.6* 10.9* -- 10.5  MG -- -- 2.4 --  PHOS 3.7 4.4 -- 5.2*   Liver Function Tests:  Lab 07/02/12 0716 06/30/12 1430 06/28/12 0500  AST -- -- --  ALT -- -- --  ALKPHOS -- -- --  BILITOT -- -- --  PROT -- -- --  ALBUMIN 1.7* 1.8* 1.9*   No results found for this basename: LIPASE:5,AMYLASE:5 in the last 168 hours No results found for this basename: AMMONIA:5 in the last 168 hours CBC:  Lab 07/02/12 0716 06/30/12 1430 06/28/12 0659  WBC 10.2 11.2* 9.4  NEUTROABS -- -- --  HGB 8.9* 8.6* 9.1*  HCT 29.0* 27.2* 29.1*  MCV 90.1 88.9 89.5  PLT  161 159 166   Cardiac Enzymes: No results found for this basename: CKTOTAL:5,CKMB:5,CKMBINDEX:5,TROPONINI:5 in the last 168 hours BNP (last 3 results)  Basename 06/02/12 1944 05/08/12 0515 04/21/12 1419  PROBNP >70000.0* >70000.0* >70000.0*   CBG: No results found for this basename: GLUCAP:5 in the last 168 hours  Recent Results (from the past 240 hour(s))  MRSA PCR SCREENING     Status: Normal   Collection Time   06/26/12  3:20 AM      Component Value Range Status Comment   MRSA by PCR NEGATIVE  NEGATIVE Final   URINE CULTURE     Status: Normal   Collection Time   06/27/12  6:36 AM      Component Value Range Status Comment   Specimen Description URINE, RANDOM   Final    Special Requests NONE   Final    Culture  Setup Time 06/27/2012 16:26   Final    Colony Count 40,000 COLONIES/ML   Final    Culture     Final    Value: Multiple bacterial morphotypes present, none predominant. Suggest appropriate recollection if clinically indicated.   Report Status 06/28/2012 FINAL   Final   CULTURE, ROUTINE-ABSCESS     Status: Normal (Preliminary result)   Collection Time   06/28/12  4:26 PM      Component Value Range Status Comment   Specimen Description ABSCESS   Final    Special Requests LEFT RETROPERITONEAL FLUID   Final    Gram Stain     Final    Value: ABUNDANT WBC PRESENT,BOTH PMN AND MONONUCLEAR     FEW YEAST   Culture     Final    Value: FEW ENTEROCOCCUS SPECIES     FEW YEAST CONSISTENT WITH CANDIDA SPECIES   Report Status PENDING   Incomplete   ANAEROBIC CULTURE     Status: Normal   Collection Time   06/28/12  4:26 PM      Component Value Range Status Comment   Specimen Description ABSCESS   Final    Special Requests LEFT RETROPERITONEAL FLUID   Final    Gram Stain     Final    Value: ABUNDANT WBC PRESENT,BOTH PMN AND MONONUCLEAR     FEW YEAST   Culture NO ANAEROBES ISOLATED   Final    Report Status 07/03/2012 FINAL   Final   FUNGUS CULTURE W SMEAR     Status: Normal  (Preliminary result)   Collection Time   06/28/12  4:26 PM      Component Value Range Status Comment   Specimen Description ABSCESS   Final    Special Requests LEFT  RETROPERITONEAL FLUID   Final    Fungal Smear NO YEAST OR FUNGAL ELEMENTS SEEN   Final    Culture CULTURE IN PROGRESS FOR FOUR WEEKS   Final    Report Status PENDING   Incomplete      Studies: No results found.  Scheduled Meds:    . aspirin EC  81 mg Oral Daily  . carvedilol  3.125 mg Oral BID WC  . cefTAZidime (FORTAZ)  IV  2 g Intravenous Q T,Th,Sat-1800  . darbepoetin  100 mcg Intravenous Q Sat-HD  . docusate sodium  100 mg Oral QHS  . feeding supplement (NEPRO CARB STEADY)  237 mL Oral BID WC  . hydrALAZINE  10 mg Oral Q8H  . HYDROmorphone  4 mg Oral Q4H  . iron dextran (INFED/DEXFERRUM) infusion  100 mg Intravenous Q T,Th,Sa-HD  . isosorbide mononitrate  30 mg Oral Daily  . magnesium oxide  400 mg Oral Daily  . methadone  10 mg Oral TID  . micafungin [facility]) IV  100 mg Intravenous Daily  . multivitamin  1 tablet Oral QHS  . pantoprazole  40 mg Oral Daily  . ranolazine  500 mg Oral Daily  . sevelamer  400 mg Oral Q breakfast  . simvastatin  10 mg Oral q1800  . sodium chloride  3 mL Intravenous Q12H  . vancomycin  500 mg Intravenous Q T,Th,Sa-HD   Continuous Infusions:   Active Problems:  Ischemic cardiomyopathy, EF 20-25% echo April 2013, now 10% by cath (05/06/12)  Chronic systolic congestive heart failure, NYHA class 3  LBBB (left bundle branch block)  Hypertension  Pancreatic pseudocyst  Pancreatitis, chronic  Thrombocytopenia  Anemia in chronic kidney disease (CKD)  Esophageal varices in cirrhosis, admitted June 2013 to [facility]  History of Portal vein thrombosis  Ascites  ESRD on hemodialysis  CAD, CABG X 4 Feb '08, LAD stent June '08  ICD (implantable cardiac defibrillator) in place, MDT placed Jan 2013 [facility] [place]  Pancreatic necrosis       Ulah Olmo  [facility] Pager  [phone]. If 8PM-8AM, please contact night-coverage at [email or web address], password [facility] 07/04/2012, 9:23 AM  LOS: 9 days

## 2012-07-04 NOTE — Progress Notes (Signed)
[facility] Progress Note  Subjective:   Feels fine.  Prefers to eat lighter meals for now.  No emerging complaints.  Objective Filed Vitals:   07/03/12 1759 07/03/12 2049 07/04/12 0424 07/04/12 0930  BP: 110/68 116/76 112/76 112/74  Pulse: 76 73 83 80  Temp: 97.7 F (36.5 C) 97.7 F (36.5 C) 97.6 F (36.4 C) 98 F (36.7 C)  TempSrc: Oral Oral Oral Oral  Resp: 18 18 18 20   Height:      Weight:  51.8 kg (114 lb 3.2 oz)    SpO2: 100% 98% 98% 98%   Physical Exam General: Chronically ill-appearing. Alert, oriented, NAD Heart: RRR, AICD left chest Lungs: CTA bilaterally.  No wheezes or rhonchi appreciated Abdomen: Soft, non-tender, trace d Extremities: + LE edema, bilaterally Dialysis Access: Rt I-J, LFA AVG unusable   Outpatient HD- [facility] On TTS, 4 hrs, Edw= 53.5 kg, heparin , EPO 1500 units q hd, no vit d last pth=178.2 (05/05/12) Bath= 3.0 k, 2.5 Ca, 1.0 mg Was on VAncomycin q hd and Fortaz 2gm qhd since 12/14./13 TFS 13% Iron load order X 10 x 06/13/12 ( needing 7 more doses )    Assessment/Plan: 1. Abdominal Pain With Chronic Pancreatitis, question pancreatic abcess; s/p IR aspiration 1/14- gram stain showing yeast, cx pend, Dr. Ninetta Lights to follow in ID clinic when dc, Awaiting Isolate Yeast Organism per ID/on micafungin/vanc/fortaz and Availability for [facility] to [facility] med (po voriconazole). 2. Hypomagnesia - Resolved.  Mg oxide d/c'd 3. Ascites, s/p therapeutic paracentesis 3L 1/17 4. ESRD, cont tts. K+ 3.9, 3K bath on hd tomorrow. WANTS to change to closer kidney center after dc to [facility] - Spoke with clinical manager Glenda Chroman, who agrees to review  census and consult with the Wellsite geologist. Should hear something in a week or so. 5. HD Access - Scheduled at [facility] med center for new R avgg Dr. Hollace Hayward 07/13/12 6. Anemia - Hgb 9.6 > 9.1>8.9 On aranesp 100, with her lower value likely related to  missed epo doses when she was admitted and  infection. Continue Infed loading. 7. Secondary hyperparathyroidism - Ca high 10.6 (12.4 Corrected ) and phos 3.7; holding Renvela, no vit D and low Ca bath w HD 8. HTN/volume - SBPs 110's.  Have decreased Coreg 6.25 to 3.125mg  at op center recently for bp control on hd/ also on Imdur 30 mg and on Hydralazine 25 mg bid/ taper back to 10mg  bid.  ~2k under EDW.  Tolerating hd uf so far, continue to challenge. Hx of cirrhosis / ascites / banding of esoph varices / portal vein and splenic vein thrombosis. Also related to her low alb and liver dz state. 9. Nutrition - Albumin 1.7 - Continues to eat sparingly given GI issues. Continue high protein renal diet, nepro, multivitamin 10. CM ( 10 % EF) / AICD 11. DNR 12. Dispo  - Awaiting approval from [place] clinic to cover po voriconazole. They are closed today.      Scot Jun. Thad Ranger [facility] [phone] pager 07/04/2012,11:02 AM  LOS: 9 days    Additional Objective Labs: Basic Metabolic Panel:  Lab 07/02/12 6962 06/30/12 1430 06/28/12 0500  NA 128* 130* 132*  K 3.6 3.5 4.3  CL 91* 93* 94*  CO2 28 27 25   GLUCOSE 87 138* 109*  BUN 29* 32* 36*  CREATININE 3.18* 3.51* 4.39*  CALCIUM 10.6* 10.9* 10.5  ALB -- -- --  PHOS 3.7 4.4 5.2*  Liver Function Tests:  Lab 07/02/12 0716 06/30/12 1430 06/28/12 0500  AST -- -- --  ALT -- -- --  ALKPHOS -- -- --  BILITOT -- -- --  PROT -- -- --  ALBUMIN 1.7* 1.8* 1.9*   CBC:  Lab 07/02/12 0716 06/30/12 1430 06/28/12 0659  WBC 10.2 11.2* 9.4  NEUTROABS -- -- --  HGB 8.9* 8.6* 9.1*  HCT 29.0* 27.2* 29.1*  MCV 90.1 88.9 89.5  PLT 161 159 166   Blood Culture    Component Value Date/Time   SDES ABSCESS 06/28/2012 1626   SDES ABSCESS 06/28/2012 1626   SDES ABSCESS 06/28/2012 1626   SPECREQUEST LEFT RETROPERITONEAL FLUID 06/28/2012 1626   SPECREQUEST LEFT RETROPERITONEAL FLUID 06/28/2012 1626   SPECREQUEST LEFT RETROPERITONEAL FLUID 06/28/2012 1626   CULT  Value: FEW ENTEROCOCCUS  SPECIES FEW YEAST CONSISTENT WITH CANDIDA SPECIES 06/28/2012 1626   CULT NO ANAEROBES ISOLATED 06/28/2012 1626   CULT CULTURE IN PROGRESS FOR FOUR WEEKS 06/28/2012 1626   REPTSTATUS PENDING 06/28/2012 1626   REPTSTATUS 07/03/2012 FINAL 06/28/2012 1626   REPTSTATUS PENDING 06/28/2012 1626    Medications:      . aspirin EC  81 mg Oral Daily  . carvedilol  3.125 mg Oral BID WC  . cefTAZidime (FORTAZ)  IV  2 g Intravenous Q T,Th,Sat-1800  . darbepoetin  100 mcg Intravenous Q Sat-HD  . docusate sodium  100 mg Oral QHS  . feeding supplement (NEPRO CARB STEADY)  237 mL Oral BID WC  . hydrALAZINE  10 mg Oral Q8H  . HYDROmorphone  4 mg Oral Q4H  . iron dextran (INFED/DEXFERRUM) infusion  100 mg Intravenous Q T,Th,Sa-HD  . isosorbide mononitrate  30 mg Oral Daily  . magnesium oxide  400 mg Oral Daily  . methadone  10 mg Oral TID  . micafungin [facility]) IV  100 mg Intravenous Daily  . multivitamin  1 tablet Oral QHS  . pantoprazole  40 mg Oral Daily  . ranolazine  500 mg Oral Daily  . sevelamer  400 mg Oral Q breakfast  . simvastatin  10 mg Oral q1800  . sodium chloride  3 mL Intravenous Q12H  . vancomycin  500 mg Intravenous Q T,Th,Sa-HD   I have seen and examined this patient and agree with plan as outlined above by Claud Kelp, PAC.  Awaiting approval from [facility] for voriconazole so can take orally.  Patient wants to change to [facility] which is around the corner from her home.  Contact has been made with Clinic Manager at [facility]. Calise Dunckel B,MD 07/04/2012 12:27 PM

## 2012-07-04 NOTE — Progress Notes (Signed)
ANTIBIOTIC CONSULT NOTE - FOLLOW UP  Pharmacy Consult for Vancomycin + Ceftazidime Indication: Pancreatic abscess   Allergies  Allergen Reactions  . Codeine Itching  . Morphine And Related Shortness Of Breath    SOB when given IV once "to me it was mild; I called out fast for help; never had to intubate" (05/23/2012)  . Ace Inhibitors Other (See Comments)    Unknown reaction but her physician stated that she can't take it (specifically lisinopril)  . Tylenol (Acetaminophen) Other (See Comments)    Patient states that the doctor told her she has a Fatty Liver.    Patient Measurements: Height: 5\' 4"  (162.6 cm) Weight: 114 lb 3.2 oz (51.8 kg) IBW/kg (Calculated) : 54.7   Vital Signs: Temp: 98 F (36.7 C) (01/20 0930) Temp src: Oral (01/20 0930) BP: 112/74 mmHg (01/20 0930) Pulse Rate: 80  (01/20 0930) Intake/Output from previous day: 01/19 0701 - 01/20 0700 In: 1740 [P.O.:1560; I.V.:80; IV Piggyback:100] Out: -  Intake/Output from this shift: Total I/O In: 120 [P.O.:120] Out: -   Labs:  Basename 07/02/12 0716  WBC 10.2  HGB 8.9*  PLT 161  LABCREA --  CREATININE 3.18*   Estimated Creatinine Clearance: 15 ml/min (by C-G formula based on Cr of 3.18). No results found for this basename: VANCOTROUGH:2,VANCOPEAK:2,VANCORANDOM:2,GENTTROUGH:2,GENTPEAK:2,GENTRANDOM:2,TOBRATROUGH:2,TOBRAPEAK:2,TOBRARND:2,AMIKACINPEAK:2,AMIKACINTROU:2,AMIKACIN:2, in the last 72 hours   Microbiology: Recent Results (from the past 720 hour(s))  MRSA PCR SCREENING     Status: Normal   Collection Time   06/26/12  3:20 AM      Component Value Range Status Comment   MRSA by PCR NEGATIVE  NEGATIVE Final   URINE CULTURE     Status: Normal   Collection Time   06/27/12  6:36 AM      Component Value Range Status Comment   Specimen Description URINE, RANDOM   Final    Special Requests NONE   Final    Culture  Setup Time 06/27/2012 16:26   Final    Colony Count 40,000 COLONIES/ML   Final    Culture      Final    Value: Multiple bacterial morphotypes present, none predominant. Suggest appropriate recollection if clinically indicated.   Report Status 06/28/2012 FINAL   Final   CULTURE, ROUTINE-ABSCESS     Status: Normal (Preliminary result)   Collection Time   06/28/12  4:26 PM      Component Value Range Status Comment   Specimen Description ABSCESS   Final    Special Requests LEFT RETROPERITONEAL FLUID   Final    Gram Stain     Final    Value: ABUNDANT WBC PRESENT,BOTH PMN AND MONONUCLEAR     FEW YEAST   Culture     Final    Value: FEW ENTEROCOCCUS SPECIES     FEW YEAST CONSISTENT WITH CANDIDA SPECIES   Report Status PENDING   Incomplete   ANAEROBIC CULTURE     Status: Normal   Collection Time   06/28/12  4:26 PM      Component Value Range Status Comment   Specimen Description ABSCESS   Final    Special Requests LEFT RETROPERITONEAL FLUID   Final    Gram Stain     Final    Value: ABUNDANT WBC PRESENT,BOTH PMN AND MONONUCLEAR     FEW YEAST   Culture NO ANAEROBES ISOLATED   Final    Report Status 07/03/2012 FINAL   Final   FUNGUS CULTURE W SMEAR     Status: Normal (Preliminary  result)   Collection Time   06/28/12  4:26 PM      Component Value Range Status Comment   Specimen Description ABSCESS   Final    Special Requests LEFT RETROPERITONEAL FLUID   Final    Fungal Smear NO YEAST OR FUNGAL ELEMENTS SEEN   Final    Culture YEAST ISOLATED;ID TO FOLLOW   Final    Report Status PENDING   Incomplete     Anti-infectives     Start     Dose/Rate Route Frequency Ordered Stop   07/02/12 0000   dextrose 5 % SOLN 50 mL with cefTAZidime 2 G SOLR        100 mL/hr over 30 Minutes   07/02/12 1031     07/02/12 0000   sodium chloride 0.9 % SOLN 100 mL with vancomycin 500 MG SOLR        100 mL/hr over 60 Minutes   07/02/12 1031     07/02/12 0000   voriconazole (VFEND) 200 MG tablet        200 mg Oral Daily 07/02/12 1031     06/30/12 1800   cefTAZidime (FORTAZ) 2 g in dextrose 5 % 50 mL  IVPB  Status:  Discontinued        2 g 100 mL/hr over 30 Minutes Intravenous Every T-Th-Sa (1800) 06/29/12 1831 07/04/12 1244   06/30/12 1200   vancomycin (VANCOCIN) 500 mg in sodium chloride 0.9 % 100 mL IVPB        500 mg 100 mL/hr over 60 Minutes Intravenous Every T-Th-Sa (Hemodialysis) 06/29/12 1831     06/29/12 2000   vancomycin (VANCOCIN) 1,250 mg in sodium chloride 0.9 % 250 mL IVPB        1,250 mg 166.7 mL/hr over 90 Minutes Intravenous  Once 06/29/12 1831 06/30/12 0029   06/29/12 1930   cefTAZidime (FORTAZ) 1 g in dextrose 5 % 50 mL IVPB        1 g 100 mL/hr over 30 Minutes Intravenous  Once 06/29/12 1831 06/29/12 2049   06/29/12 1900   micafungin (MYCAMINE) 100 mg in sodium chloride 0.9 % 100 mL IVPB        100 mg 100 mL/hr over 1 Hours Intravenous Daily 06/29/12 1757     06/26/12 0100   sulfamethoxazole-trimethoprim (BACTRIM DS) 800-160 MG per tablet 2 tablet  Status:  Discontinued        2 tablet Oral Daily at bedtime 06/26/12 0050 06/29/12 1758   06/25/12 2315   sulfamethoxazole-trimethoprim (BACTRIM DS,SEPTRA DS) 800-160 MG per tablet 1 tablet  Status:  Discontinued        1 tablet Oral 2 times daily 06/25/12 2301 06/26/12 0048          Assessment: 62 y.o F with ESRD who presented to the [facility] on 06/25/12 with abdominal pain. The patient has a PMH significant for chronic/necrotizing pancreatitis with hx pancreatic pseudocyst. IR was consulted and and were able to aspirate the left perisplenic / retroperitoneal fluid and gas collection -- which has been sent for culturing. ID has consulted pharmacy to dose Vancomycin + Ceftazidime along with Micafungin per MD for empiric coverage while awaiting culture results. Cultures thus far show a few enterococcus and yeast.  The patient receives HD on T/Th/Sat. The patient was loaded with Vancomycin on 1/15 and received a maintenance dose appropriately after tolerating a full HD session on 1/16 and 1/18. Doses remain appropriate at  this time of both Vanc/Ceftaz. Noted plans for discharge  soon. Will check a pre-HD Vancomycin level on 1/21 if the patient is still here.  Goal of Therapy:  Pre-HD Vancomycin level of 15-25 mcg/ml  Plan:  1. Continue Vancomycin 500 mg IV post HD sessions on T/Th/Sat 2. Continue Ceftazidime 2g IV post HD sessions on T/Th/Sat 3. Will plan to check a pre-HD level prior to HD on Tues, 1/21. 4. Will continue to follow HD schedule/duration, culture results, LOT, and antibiotic de-escalation plans   Georgina Pillion, PharmD, BCPS Clinical Pharmacist Pager: [phone] 07/04/2012 1:23 PM

## 2012-07-04 NOTE — Progress Notes (Signed)
PT Cancellation Note  Patient Details Name: [name] MRN: [id number] DOB: [birth year]   Cancelled Treatment:    Reason Eval/Treat Not Completed: Other (comment) (pt reports her abdomen is too painful right now)   [name], PT, DPT [phone]   07/04/2012, 3:13 PM

## 2012-07-04 NOTE — Care Management Note (Signed)
   CARE MANAGEMENT NOTE 07/04/2012  Patient:  [name], [name]   Account Number:  [id number]  Date Initiated:  06/27/2012  Documentation initiated by:  Janeen Watson  Subjective/Objective Assessment:   CM following for progression and d/c planning.  07/04/2012 Request for assistance with medication, Voriconazole.     Action/Plan:   01/20 Met with pt who received medications from [place]. All [facility] numbers called and messages left however no response due to holiday, will place calls tomorrow re coverage for this drug.   Anticipated DC Date:  07/06/2012   Anticipated DC Plan:  HOME W HOME HEALTH SERVICES      DC Planning Services  CM consult  Medication Assistance      Choice offered to / List presented to:             Status of service:  In process, will continue to follow Medicare Important Message given?   (If response is "NO", the following Medicare IM given date fields will be blank) Date Medicare IM given:   Date Additional Medicare IM given:    Discharge Disposition:  HOME W HOME HEALTH SERVICES  Per UR Regulation:    If discussed at Long Length of Stay Meetings, dates discussed:    Comments:  07/04/2012 Met with pt and called all numbers that pt was able to provide for VA services in [place], messages left due to holiday. Will begin calling again tomorrow am 07/05/2012 to determine if [facility] will cover, Voriconazole. Johny Shock RN MPH Case Manager [phone]  Elliot Cousin, RN Case Manager Signed CASE MANAGEMENT Progress Notes 07/02/2012 5:54 PM NCM [facility] pt [facility]. [facility] on [facility] and will be closed on Monday, 07/04/2012. NCM will follow up with [facility] for medication on 1/21. Isidoro Donning RN CCM Case Mgmt phone [phone]

## 2012-07-04 NOTE — Progress Notes (Signed)
[facility]  Day # 10 antibiotics Day # 6 mycafungin Day # 6 ceftazidime Day # 6 vancomycin  Subjective: No new complaints   Antibiotics:  Anti-infectives     Start     Dose/Rate Route Frequency Ordered Stop   07/02/12 0000   dextrose 5 % SOLN 50 mL with cefTAZidime 2 G SOLR        100 mL/hr over 30 Minutes   07/02/12 1031     07/02/12 0000   sodium chloride 0.9 % SOLN 100 mL with vancomycin 500 MG SOLR        100 mL/hr over 60 Minutes   07/02/12 1031     07/02/12 0000   voriconazole (VFEND) 200 MG tablet        200 mg Oral Daily 07/02/12 1031     06/30/12 1800   cefTAZidime (FORTAZ) 2 g in dextrose 5 % 50 mL IVPB  Status:  Discontinued        2 g 100 mL/hr over 30 Minutes Intravenous Every T-Th-Sa (1800) 06/29/12 1831 07/04/12 1244   06/30/12 1200   vancomycin (VANCOCIN) 500 mg in sodium chloride 0.9 % 100 mL IVPB        500 mg 100 mL/hr over 60 Minutes Intravenous Every T-Th-Sa (Hemodialysis) 06/29/12 1831     06/29/12 2000   vancomycin (VANCOCIN) 1,250 mg in sodium chloride 0.9 % 250 mL IVPB        1,250 mg 166.7 mL/hr over 90 Minutes Intravenous  Once 06/29/12 1831 06/30/12 0029   06/29/12 1930   cefTAZidime (FORTAZ) 1 g in dextrose 5 % 50 mL IVPB        1 g 100 mL/hr over 30 Minutes Intravenous  Once 06/29/12 1831 06/29/12 2049   06/29/12 1900   micafungin (MYCAMINE) 100 mg in sodium chloride 0.9 % 100 mL IVPB        100 mg 100 mL/hr over 1 Hours Intravenous Daily 06/29/12 1757     06/26/12 0100   sulfamethoxazole-trimethoprim (BACTRIM DS) 800-160 MG per tablet 2 tablet  Status:  Discontinued        2 tablet Oral Daily at bedtime 06/26/12 0050 06/29/12 1758   06/25/12 2315   sulfamethoxazole-trimethoprim (BACTRIM DS,SEPTRA DS) 800-160 MG per tablet 1 tablet  Status:  Discontinued        1 tablet Oral 2 times daily 06/25/12 2301 06/26/12 0048          Medications: Scheduled Meds:   . aspirin EC  81 mg Oral Daily  .  carvedilol  3.125 mg Oral BID WC  . darbepoetin  100 mcg Intravenous Q Sat-HD  . docusate sodium  100 mg Oral QHS  . feeding supplement (NEPRO CARB STEADY)  237 mL Oral BID WC  . hydrALAZINE  10 mg Oral Q8H  . HYDROmorphone  4 mg Oral Q4H  . iron dextran (INFED/DEXFERRUM) infusion  100 mg Intravenous Q T,Th,Sa-HD  . isosorbide mononitrate  30 mg Oral Daily  . methadone  10 mg Oral TID  . micafungin [facility]) IV  100 mg Intravenous Daily  . multivitamin  1 tablet Oral QHS  . pantoprazole  40 mg Oral Daily  . ranolazine  500 mg Oral Daily  . sevelamer  400 mg Oral Q breakfast  . simvastatin  10 mg Oral q1800  . sodium chloride  3 mL Intravenous Q12H  . vancomycin  500 mg Intravenous Q T,Th,Sa-HD   Continuous Infusions:  PRN Meds:.sodium  chloride, bisacodyl, HYDROmorphone, ondansetron, polyethylene glycol, sodium chloride   Objective: Weight change: 3 lb 4.9 oz (1.5 kg)  Intake/Output Summary (Last 24 hours) at 07/04/12 1510 Last data filed at 07/04/12 0900  Gross per 24 hour  Intake   1380 ml  Output      0 ml  Net   1380 ml   Blood pressure 112/74, pulse 80, temperature 98 F (36.7 C), temperature source Oral, resp. rate 20, height 5\' 4"  (1.626 m), weight 114 lb 3.2 oz (51.8 kg), SpO2 98.00%. Temp:  [97.6 F (36.4 C)-98 F (36.7 C)] 98 F (36.7 C) (01/20 0930) Pulse Rate:  [73-83] 80  (01/20 0930) Resp:  [18-20] 20  (01/20 0930) BP: (110-116)/(68-76) 112/74 mmHg (01/20 0930) SpO2:  [98 %-100 %] 98 % (01/20 0930) Weight:  [114 lb 3.2 oz (51.8 kg)] 114 lb 3.2 oz (51.8 kg) (01/19 2049)  Physical Exam: General: Alert and awake, oriented x3, not in any acute distress. HEENT: anicteric sclera, pupils reactive to light and accommodation, EOMI CVS regular rate, normal r,  no murmur rubs or gallops Chest: clear to auscultation bilaterally, no wheezing, rales or rhonchi Abdomen: mildly diffusely tender, pos bs Neuro: nonfocal  Lab Results:  [facility] 07/02/12 0716  WBC  10.2  HGB 8.9*  HCT 29.0*  PLT 161    BMET  Basename 07/02/12 0716  NA 128*  K 3.6  CL 91*  CO2 28  GLUCOSE 87  BUN 29*  CREATININE 3.18*  CALCIUM 10.6*    Micro Results: Recent Results (from the past 240 hour(s))  MRSA PCR SCREENING     Status: Normal   Collection Time   06/26/12  3:20 AM      Component Value Range Status Comment   MRSA by PCR NEGATIVE  NEGATIVE Final   URINE CULTURE     Status: Normal   Collection Time   06/27/12  6:36 AM      Component Value Range Status Comment   Specimen Description URINE, RANDOM   Final    Special Requests NONE   Final    Culture  Setup Time 06/27/2012 16:26   Final    Colony Count 40,000 COLONIES/ML   Final    Culture     Final    Value: Multiple bacterial morphotypes present, none predominant. Suggest appropriate recollection if clinically indicated.   Report Status 06/28/2012 FINAL   Final   CULTURE, ROUTINE-ABSCESS     Status: Normal (Preliminary result)   Collection Time   06/28/12  4:26 PM      Component Value Range Status Comment   Specimen Description ABSCESS   Final    Special Requests LEFT RETROPERITONEAL FLUID   Final    Gram Stain     Final    Value: ABUNDANT WBC PRESENT,BOTH PMN AND MONONUCLEAR     FEW YEAST   Culture     Final    Value: FEW ENTEROCOCCUS SPECIES     FEW YEAST CONSISTENT WITH CANDIDA SPECIES   Report Status PENDING   Incomplete   ANAEROBIC CULTURE     Status: Normal   Collection Time   06/28/12  4:26 PM      Component Value Range Status Comment   Specimen Description ABSCESS   Final    Special Requests LEFT RETROPERITONEAL FLUID   Final    Gram Stain     Final    Value: ABUNDANT WBC PRESENT,BOTH PMN AND MONONUCLEAR     FEW YEAST  Culture NO ANAEROBES ISOLATED   Final    Report Status 07/03/2012 FINAL   Final   FUNGUS CULTURE W SMEAR     Status: Normal (Preliminary result)   Collection Time   06/28/12  4:26 PM      Component Value Range Status Comment   Specimen Description ABSCESS   Final     Special Requests LEFT RETROPERITONEAL FLUID   Final    Fungal Smear NO YEAST OR FUNGAL ELEMENTS SEEN   Final    Culture YEAST ISOLATED;ID TO FOLLOW   Final    Report Status PENDING   Incomplete     Studies/Results: No results found.    Assessment/Plan: [name] is a 63 y.o. female with  ESRD, cirrhosis, ischemic cardiomyopathy, prior pancreatectomy,  She has been on peritoneal dialysis until April 2013 ,at the time she was admitted to [facility] and found to have bacteremia with MRSA as well as a KPC producing Enterobacter. She was also fungemic with Candida parapsilosis. She had a perforated viscus noted on her abdominal imaging at that time however this was managed medically.  She was readmitted to [facility] 10-2011 after this with peritonitis with VRE as well as Candida parapsilosis.  admission for fevers in December and now again in January having failed bactrim suppressive therapy/SBP prophylaxis with progression of pancreatic abscesses, sp IR guided aspiration with yeast and enterococcus growing on culture    #1 Pancreatic abscess: She has grown VRE and C parapsilosis in the past now growing an enterococcus and candida species --would continue vancomycin , ceftazidime and mycafungin for now pending sensis on the organisms she is growing --could consider dcing ceftaz but would then have no GNR of these abscesses and not confident there are no GNR present  Dr. Ninetta Lights is back tomorrow.    LOS: 9 days   Acey Lav 07/04/2012, 3:10 PM

## 2012-07-05 LAB — CBC
Hemoglobin: 9.2 g/dL — ABNORMAL LOW (ref 12.0–15.0)
MCH: 27.4 pg (ref 26.0–34.0)
Platelets: 189 10*3/uL (ref 150–400)
RBC: 3.36 MIL/uL — ABNORMAL LOW (ref 3.87–5.11)
WBC: 12.9 10*3/uL — ABNORMAL HIGH (ref 4.0–10.5)

## 2012-07-05 LAB — RENAL FUNCTION PANEL
CO2: 26 mEq/L (ref 19–32)
Chloride: 92 mEq/L — ABNORMAL LOW (ref 96–112)
GFR calc Af Amer: 14 mL/min — ABNORMAL LOW (ref >90)
GFR calc non Af Amer: 12 mL/min — ABNORMAL LOW (ref >90)
Glucose, Bld: 68 mg/dL — ABNORMAL LOW (ref 70–99)
Potassium: 4.7 mEq/L (ref 3.5–5.1)
Sodium: 128 mEq/L — ABNORMAL LOW (ref 135–145)

## 2012-07-05 LAB — CULTURE, ROUTINE-ABSCESS

## 2012-07-05 MED ORDER — VANCOMYCIN HCL 500 MG IV SOLR: INTRAVENOUS | Status: DC

## 2012-07-05 MED ORDER — DEXTROSE 5 % IV SOLN: INTRAVENOUS | Status: DC

## 2012-07-05 MED ORDER — VANCOMYCIN HCL 1000 MG IV SOLR
750.0000 mg | INTRAVENOUS | Status: AC
  Administered 2012-07-05: 750 mg via INTRAVENOUS
  Filled 2012-07-05: qty 750

## 2012-07-05 MED ORDER — VORICONAZOLE 200 MG PO TABS: 200.0000 mg | ORAL_TABLET | Freq: Two times a day (BID) | ORAL | Status: DC

## 2012-07-05 MED ORDER — VANCOMYCIN HCL 500 MG IV SOLR
500.0000 mg | INTRAVENOUS | Status: DC
  Administered 2012-07-07: 500 mg via INTRAVENOUS
  Filled 2012-07-05 (×2): qty 500

## 2012-07-05 MED ORDER — HYDROMORPHONE HCL PF 1 MG/ML IJ SOLN
INTRAMUSCULAR | Status: AC
  Administered 2012-07-05: 1 mg via INTRAVENOUS
  Filled 2012-07-05: qty 1

## 2012-07-05 NOTE — Progress Notes (Signed)
INFECTIOUS DISEASE PROGRESS NOTE  ID: [name] is a 62 y.o. female with  Active Problems:  Ischemic cardiomyopathy, EF 20-25% echo April 2013, now 10% by cath (05/06/12)  Chronic systolic congestive heart failure, NYHA class 3  LBBB (left bundle branch block)  Hypertension  Pancreatic pseudocyst  Pancreatitis, chronic  Thrombocytopenia  Anemia in chronic kidney disease (CKD)  Esophageal varices in cirrhosis, admitted June 2013 to [facility]  History of Portal vein thrombosis  Ascites  ESRD on hemodialysis  CAD, CABG X 4 Feb '08, LAD stent June '08  ICD (implantable cardiac defibrillator) in place, MDT placed Jan 2013 [facility]  Pancreatic necrosis  Subjective: continued abd pain.   Abtx:  Anti-infectives     Start     Dose/Rate Route Frequency Ordered Stop   07/07/12 1200   vancomycin (VANCOCIN) 500 mg in sodium chloride 0.9 % 100 mL IVPB        500 mg 100 mL/hr over 60 Minutes Intravenous Every T-Th-Sa (Hemodialysis) 07/05/12 0845     07/05/12 1800   cefTAZidime (FORTAZ) 2 g in dextrose 5 % 50 mL IVPB        2 g 100 mL/hr over 30 Minutes Intravenous Every T-Th-Sa (1800) 07/04/12 1524     07/05/12 1200   vancomycin (VANCOCIN) 750 mg in sodium chloride 0.9 % 150 mL IVPB        750 mg 150 mL/hr over 60 Minutes Intravenous Every Tue (Hemodialysis) 07/05/12 0845 07/05/12 1039   07/05/12 0000   voriconazole (VFEND) 200 MG tablet        200 mg Oral 2 times daily 07/05/12 1130     07/05/12 0000   dextrose 5 % SOLN 50 mL with cefTAZidime 2 G SOLR        100 mL/hr over 30 Minutes   07/05/12 1130     07/05/12 0000   sodium chloride 0.9 % SOLN 100 mL with vancomycin 500 MG SOLR        100 mL/hr over 60 Minutes   07/05/12 1130     07/02/12 0000   dextrose 5 % SOLN 50 mL with cefTAZidime 2 G SOLR        100 mL/hr over 30 Minutes   07/02/12 1031     07/02/12 0000   sodium chloride 0.9 % SOLN 100 mL with vancomycin 500 MG SOLR        100 mL/hr over 60 Minutes   07/02/12 1031      07/02/12 0000   voriconazole (VFEND) 200 MG tablet  Status:  Discontinued        200 mg Oral Daily 07/02/12 1031 07/05/12    06/30/12 1800   cefTAZidime (FORTAZ) 2 g in dextrose 5 % 50 mL IVPB  Status:  Discontinued        2 g 100 mL/hr over 30 Minutes Intravenous Every T-Th-Sa (1800) 06/29/12 1831 07/04/12 1244   06/30/12 1200   vancomycin (VANCOCIN) 500 mg in sodium chloride 0.9 % 100 mL IVPB  Status:  Discontinued        500 mg 100 mL/hr over 60 Minutes Intravenous Every T-Th-Sa (Hemodialysis) 06/29/12 1831 07/05/12 0845   06/29/12 2000   vancomycin (VANCOCIN) 1,250 mg in sodium chloride 0.9 % 250 mL IVPB        1,250 mg 166.7 mL/hr over 90 Minutes Intravenous  Once 06/29/12 1831 06/30/12 0029   06/29/12 1930   cefTAZidime (FORTAZ) 1 g in dextrose 5 % 50 mL IVPB  1 g 100 mL/hr over 30 Minutes Intravenous  Once 06/29/12 1831 06/29/12 2049   06/29/12 1900   micafungin (MYCAMINE) 100 mg in sodium chloride 0.9 % 100 mL IVPB        100 mg 100 mL/hr over 1 Hours Intravenous Daily 06/29/12 1757     06/26/12 0100   sulfamethoxazole-trimethoprim (BACTRIM DS) 800-160 MG per tablet 2 tablet  Status:  Discontinued        2 tablet Oral Daily at bedtime 06/26/12 0050 06/29/12 1758   06/25/12 2315   sulfamethoxazole-trimethoprim (BACTRIM DS,SEPTRA DS) 800-160 MG per tablet 1 tablet  Status:  Discontinued        1 tablet Oral 2 times daily 06/25/12 2301 06/26/12 0048          Medications:  Scheduled:   . aspirin EC  81 mg Oral Daily  . carvedilol  3.125 mg Oral BID WC  . cefTAZidime (FORTAZ)  IV  2 g Intravenous Q T,Th,Sat-1800  . darbepoetin  100 mcg Intravenous Q Sat-HD  . docusate sodium  100 mg Oral QHS  . feeding supplement (NEPRO CARB STEADY)  237 mL Oral BID WC  . hydrALAZINE  10 mg Oral Q8H  . HYDROmorphone  4 mg Oral Q4H  . iron dextran (INFED/DEXFERRUM) infusion  100 mg Intravenous Q T,Th,Sa-HD  . isosorbide mononitrate  30 mg Oral Daily  . methadone  10 mg Oral  TID  . micafungin [facility]) IV  100 mg Intravenous Daily  . multivitamin  1 tablet Oral QHS  . pantoprazole  40 mg Oral Daily  . ranolazine  500 mg Oral Daily  . sevelamer  400 mg Oral Q breakfast  . simvastatin  10 mg Oral q1800  . sodium chloride  3 mL Intravenous Q12H  . vancomycin  500 mg Intravenous Q T,Th,Sa-HD    Objective: Vital signs in last 24 hours: Temp:  [97.4 F (36.3 C)-98.9 F (37.2 C)] 98.4 F (36.9 C) (01/21 1659) Pulse Rate:  [64-92] 81  (01/21 1659) Resp:  [15-18] 15  (01/21 1659) BP: (98-137)/(63-96) 115/76 mmHg (01/21 1659) SpO2:  [96 %-100 %] 100 % (01/21 1659) Weight:  [51.3 kg (113 lb 1.5 oz)-53 kg (116 lb 13.5 oz)] 51.3 kg (113 lb 1.5 oz) (01/21 1055)   General appearance: alert, cooperative and no distress GI: normal findings: bowel sounds normal and soft and abnormal findings:  distended and although distension is improved  Lab Results  Basename 07/05/12 0701  WBC 12.9*  HGB 9.2*  HCT 30.1*  NA 128*  K 4.7  CL 92*  CO2 26  BUN 42*  CREATININE 3.77*  GLU --   Liver Panel  Basename 07/05/12 0701  PROT --  ALBUMIN 1.6*  AST --  ALT --  ALKPHOS --  BILITOT --  BILIDIR --  IBILI --   Sedimentation Rate No results found for this basename: ESRSEDRATE in the last 72 hours C-Reactive Protein No results found for this basename: CRP:2 in the last 72 hours  Microbiology: Recent Results (from the past 240 hour(s))  MRSA PCR SCREENING     Status: Normal   Collection Time   06/26/12  3:20 AM      Component Value Range Status Comment   MRSA by PCR NEGATIVE  NEGATIVE Final   URINE CULTURE     Status: Normal   Collection Time   06/27/12  6:36 AM      Component Value Range Status Comment   Specimen Description URINE, RANDOM  Final    Special Requests NONE   Final    Culture  Setup Time 06/27/2012 16:26   Final    Colony Count 40,000 COLONIES/ML   Final    Culture     Final    Value: Multiple bacterial morphotypes present, none  predominant. Suggest appropriate recollection if clinically indicated.   Report Status 06/28/2012 FINAL   Final   CULTURE, ROUTINE-ABSCESS     Status: Normal   Collection Time   06/28/12  4:26 PM      Component Value Range Status Comment   Specimen Description ABSCESS   Final    Special Requests LEFT RETROPERITONEAL FLUID   Final    Gram Stain     Final    Value: ABUNDANT WBC PRESENT,BOTH PMN AND MONONUCLEAR     FEW YEAST   Culture     Final    Value: FEW ENTEROCOCCUS SPECIES     FEW YEAST CONSISTENT WITH CANDIDA SPECIES   Report Status 07/05/2012 FINAL   Final    Organism ID, Bacteria ENTEROCOCCUS SPECIES   Final   ANAEROBIC CULTURE     Status: Normal   Collection Time   06/28/12  4:26 PM      Component Value Range Status Comment   Specimen Description ABSCESS   Final    Special Requests LEFT RETROPERITONEAL FLUID   Final    Gram Stain     Final    Value: ABUNDANT WBC PRESENT,BOTH PMN AND MONONUCLEAR     FEW YEAST   Culture NO ANAEROBES ISOLATED   Final    Report Status 07/03/2012 FINAL   Final   FUNGUS CULTURE W SMEAR     Status: Normal (Preliminary result)   Collection Time   06/28/12  4:26 PM      Component Value Range Status Comment   Specimen Description ABSCESS   Final    Special Requests LEFT RETROPERITONEAL FLUID   Final    Fungal Smear NO YEAST OR FUNGAL ELEMENTS SEEN   Final    Culture YEAST ISOLATED;ID TO FOLLOW   Final    Report Status PENDING   Incomplete     Studies/Results: No results found.   Assessment/Plan: Pancreatic abscess (enterococcus- pan sens, yeast) Cirrhosis ESRD  Await yeast ID, sensi (must be requested- diflucan, ampho, mycafungin) Will continue her vanco and ceftaz with HD.  Will cont her mycafungin while in hospital. Plan for her to be d/c on voriconazole.  My appreciation to pharmacy for educating pt re: co-administration of vori with food/soda.    Johny Sax Infectious Diseases [phone] 07/05/2012, 5:09 PM   LOS: 10 days

## 2012-07-05 NOTE — Progress Notes (Signed)
Physical Therapy Treatment Patient Details Name: TRIANNA LUPIEN MRN: [id number] DOB: 07-23-1950 Today's Date: 07/05/2012 Time: 4098-1191 PT Time Calculation (min): 25 min  PT Assessment / Plan / Recommendation Comments on Treatment Session  Improving activity tolerance today, with less abdominal pain than yesterday; continues to be ontrack for dc home with HHPT follow-up    Follow Up Recommendations  Home health PT     Does the patient have the potential to tolerate intense rehabilitation     Barriers to Discharge        Equipment Recommendations   (4wheeled RW)    Recommendations for Other Services    Frequency Min 3X/week   Plan Discharge plan remains appropriate    Precautions / Restrictions Precautions Precautions: Fall Precaution Comments: fall risk is minimized with RW use   Pertinent Vitals/Pain Some abdominal pain, but not enough to get in the way of ambulating no apparent distress     Mobility  Bed Mobility Bed Mobility: Supine to Sit;Sitting - Scoot to Edge of Bed;Sit to Supine Supine to Sit: 6: Modified independent (Device/Increase time) Sitting - Scoot to Edge of Bed: 6: Modified independent (Device/Increase time) Sit to Supine: 6: Modified independent (Device/Increase time) Details for Bed Mobility Assistance: Smooth transition Transfers Transfers: Sit to Stand;Stand to Sit Sit to Stand: 4: Min guard;From bed (without physical contact) Stand to Sit: 4: Min guard;To bed (without physical contact) Details for Transfer Assistance: Minimal cues fro safety; better control of descent with stand to sit Ambulation/Gait Ambulation/Gait Assistance: 4: Min guard;5: Supervision Ambulation Distance (Feet): 150 Feet Assistive device: Rolling walker Ambulation/Gait Assistance Details: Minguard progressing to Supervision assist with amb; Much imporved steadiness with incr distance today over last session; min cues for posture, and to self-monitor for activity  tolerance Gait Pattern: Step-through pattern Gait velocity: decr, but approaching WNL    Exercises     PT Diagnosis:    PT Problem List:   PT Treatment Interventions:     PT Goals Acute Rehab PT Goals Time For Goal Achievement: 07/13/12 Potential to Achieve Goals: Good Pt will go Supine/Side to Sit: with modified independence PT Goal: Supine/Side to Sit - Progress: Met Pt will go Sit to Supine/Side: with modified independence PT Goal: Sit to Supine/Side - Progress: Met Pt will go Sit to Stand: with modified independence PT Goal: Sit to Stand - Progress: Progressing toward goal Pt will go Stand to Sit: with modified independence PT Goal: Stand to Sit - Progress: Progressing toward goal Pt will Ambulate: >150 feet;with modified independence;with rolling walker PT Goal: Ambulate - Progress: Progressing toward goal  Visit Information  Last PT Received On: 07/05/12 Assistance Needed: +1    Subjective Data  Subjective: Agreeable to amb Patient Stated Goal: get better and go home   Cognition  Overall Cognitive Status: Appears within functional limits for tasks assessed/performed Arousal/Alertness: Awake/alert Orientation Level: Appears intact for tasks assessed Behavior During Session: [facility] for tasks performed    Balance     End of Session PT - End of Session Equipment Utilized During Treatment: Gait belt Activity Tolerance: Patient tolerated treatment well Patient left: in bed;with call bell/phone within reach Nurse Communication: Mobility status   GP     Olen Pel [place], [place] [phone] 07/05/2012, 4:40 PM

## 2012-07-05 NOTE — Progress Notes (Signed)
[facility] PROGRESS NOTE  [name]:[id number] DOB: 03-03-1951 DOA: 06/25/2012 PCP: Jyl Heinz, MD  Assessment/Plan:  1-Abdominal pain: Patient with history of complex infected pseudoocyst with pancreatic necrosis last admission 05-22-2012. She was treated with IV antibiotics ceftazidime, and Vancomycin until 06/07/12. Suppose to have CT scan 06-10-2012. She has history of chronic pancreatitis. She presents with worsening abdominal pain. 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 Ascites. Abnormal hepatic parenchymal perfusion with diffuse portal venous thrombosis. Changes have progressed since previous study.  -Surgery consulted. 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 Case manager informed on 07/02/12   2. ESRD: Continue with Dialysis. Correction of electrolytes during dialysis. No complaint with dialysis, missed last 2 session prior to admission.  3-Ischemic cardiomyopathy status [employer] pacemaker/AICD with EF 10% in 04/2012: Continue with Zocor, IMDUR, Coreg, aspirin.  4-Hyperkalemia: Correction with dialysis.  5-Weakness: PT, OT . Neuro exam non focal.    Code Status: full. Family Communication: Care discussed with patient.  Disposition Plan: home    Consultants:  Nephrologist.   ID  Procedures:  None.  Antibiotics:  Vanc 1/15  Fortaz 1/15   Micafungin  1/15  HPI/Subjective: Seen during HD   Objective: Filed Vitals:   07/05/12 0730 07/05/12 0757  07/05/12 0805 07/05/12 0810  BP: 107/73 108/69 98/64 107/76  Pulse: 83 81 83 84  Temp:      TempSrc:      Resp:      Height:      Weight:      SpO2:       Patient Vitals for the past 24 hrs:  BP Temp Temp src Pulse Resp SpO2 Weight  07/05/12 0810 107/76 mmHg - - 84  - - -  07/05/12 0805 98/64 mmHg - - 83  - - -  07/05/12 0757 108/69 mmHg - - 81  - - -  07/05/12 0730 107/73 mmHg - - 83  - - -  07/05/12 0700 121/85 mmHg - - 80  - - -  07/05/12 0651 119/84 mmHg - - 85  - - -  07/05/12 0632 137/96 mmHg 97.7 F (36.5 C) Oral 78  18  98 % 53 kg (116 lb 13.5 oz)  07/05/12 0531 112/79 mmHg 98.4 F (36.9 C) Oral 84  18  98 % -  07/04/12 1400 118/79 mmHg 97.3 F (36.3 C) Oral 74  20  99 % -  07/04/12 0930 112/74 mmHg 98 F (36.7 C) Oral 80  20  98 % -     Intake/Output Summary (Last 24 hours) at 07/05/12 0830 Last data filed at 07/04/12 1938  Gross per 24 hour  Intake    340 ml  Output      0 ml  Net    340 ml   Filed Weights   07/02/12 2050 07/03/12 2049 07/05/12 0632  Weight: 50.3 kg (110 lb 14.3 oz) 51.8 kg (114 lb 3.2 oz) 53 kg (116 lb 13.5 oz)    Exam:   General:  No distress.  Cardiovascular: S 1, S 2, RRR.   Respiratory: CTA.  Abdomen: BS present, soft, decrease distension   Data Reviewed: Basic Metabolic Panel:  Lab 07/05/12 2956 07/02/12 0716 06/30/12 1430  NA 128* 128* 130*  K 4.7 3.6 3.5  CL 92* 91* 93*  CO2 26 28 27   GLUCOSE 68* 87 138*  BUN 42* 29* 32*  CREATININE 3.77* 3.18* 3.51*  CALCIUM 11.0* 10.6* 10.9*  MG -- -- --  PHOS 4.1 3.7 4.4   Liver Function Tests:  Lab 07/05/12 0701 07/02/12 0716 06/30/12 1430  AST -- -- --  ALT -- -- --  ALKPHOS -- -- --  BILITOT -- -- --  PROT -- -- --  ALBUMIN 1.6* 1.7* 1.8*   No results found for this basename: LIPASE:5,AMYLASE:5 in the last 168 hours No results found for this basename: AMMONIA:5 in the last 168 hours CBC:  Lab 07/05/12 0701 07/02/12 0716 06/30/12 1430  WBC 12.9* 10.2 11.2*    NEUTROABS -- -- --  HGB 9.2* 8.9* 8.6*  HCT 30.1* 29.0* 27.2*  MCV 89.6 90.1 88.9  PLT 189 161 159   Cardiac Enzymes: No results found for this basename: CKTOTAL:5,CKMB:5,CKMBINDEX:5,TROPONINI:5 in the last 168 hours BNP (last 3 results)  Basename 06/02/12 1944 05/08/12 0515 04/21/12 1419  PROBNP >70000.0* >70000.0* >70000.0*   CBG: No results found for this basename: GLUCAP:5 in the last 168 hours  Recent Results (from the past 240 hour(s))  MRSA PCR SCREENING     Status: Normal   Collection Time   06/26/12  3:20 AM      Component Value Range Status Comment   MRSA by PCR NEGATIVE  NEGATIVE Final   URINE CULTURE     Status: Normal   Collection Time   06/27/12  6:36 AM      Component Value Range Status Comment   Specimen Description URINE, RANDOM   Final    Special Requests NONE   Final    Culture  Setup Time 06/27/2012 16:26   Final    Colony Count 40,000 COLONIES/ML   Final    Culture     Final    Value: Multiple bacterial morphotypes present, none predominant. Suggest appropriate recollection if clinically indicated.   Report Status 06/28/2012 FINAL   Final   CULTURE, ROUTINE-ABSCESS     Status: Normal   Collection Time   06/28/12  4:26 PM      Component Value Range Status Comment   Specimen Description ABSCESS   Final    Special Requests LEFT RETROPERITONEAL FLUID   Final    Gram Stain     Final    Value: ABUNDANT WBC PRESENT,BOTH PMN AND MONONUCLEAR     FEW YEAST   Culture     Final    Value: FEW ENTEROCOCCUS SPECIES     FEW YEAST CONSISTENT WITH CANDIDA SPECIES   Report Status 07/05/2012 FINAL   Final    Organism ID, Bacteria ENTEROCOCCUS SPECIES   Final   ANAEROBIC CULTURE     Status: Normal   Collection Time   06/28/12  4:26 PM      Component Value Range Status Comment   Specimen Description ABSCESS   Final    Special Requests LEFT RETROPERITONEAL FLUID   Final    Gram Stain     Final    Value: ABUNDANT WBC PRESENT,BOTH PMN AND MONONUCLEAR     FEW YEAST    Culture NO ANAEROBES ISOLATED   Final  Report Status 07/03/2012 FINAL   Final   FUNGUS CULTURE W SMEAR     Status: Normal (Preliminary result)   Collection Time   06/28/12  4:26 PM      Component Value Range Status Comment   Specimen Description ABSCESS   Final    Special Requests LEFT RETROPERITONEAL FLUID   Final    Fungal Smear NO YEAST OR FUNGAL ELEMENTS SEEN   Final    Culture YEAST ISOLATED;ID TO FOLLOW   Final    Report Status PENDING   Incomplete      Studies: No results found.  Scheduled Meds:    . aspirin EC  81 mg Oral Daily  . carvedilol  3.125 mg Oral BID WC  . cefTAZidime (FORTAZ)  IV  2 g Intravenous Q T,Th,Sat-1800  . darbepoetin  100 mcg Intravenous Q Sat-HD  . docusate sodium  100 mg Oral QHS  . feeding supplement (NEPRO CARB STEADY)  237 mL Oral BID WC  . hydrALAZINE  10 mg Oral Q8H  . HYDROmorphone  4 mg Oral Q4H  . iron dextran (INFED/DEXFERRUM) infusion  100 mg Intravenous Q T,Th,Sa-HD  . isosorbide mononitrate  30 mg Oral Daily  . methadone  10 mg Oral TID  . micafungin [facility]) IV  100 mg Intravenous Daily  . multivitamin  1 tablet Oral QHS  . pantoprazole  40 mg Oral Daily  . ranolazine  500 mg Oral Daily  . sevelamer  400 mg Oral Q breakfast  . simvastatin  10 mg Oral q1800  . sodium chloride  3 mL Intravenous Q12H  . vancomycin  500 mg Intravenous Q T,Th,Sa-HD   Continuous Infusions:   Active Problems:  Ischemic cardiomyopathy, EF 20-25% echo April 2013, now 10% by cath (05/06/12)  Chronic systolic congestive heart failure, NYHA class 3  LBBB (left bundle branch block)  Hypertension  Pancreatic pseudocyst  Pancreatitis, chronic  Thrombocytopenia  Anemia in chronic kidney disease (CKD)  Esophageal varices in cirrhosis, admitted June 2013 to [facility]  History of Portal vein thrombosis  Ascites  ESRD on hemodialysis  CAD, CABG X 4 Feb '08, LAD stent June '08  ICD (implantable cardiac defibrillator) in place, MDT placed Jan 2013 [facility]   Pancreatic necrosis       Wally Behan  [facility] Pager [phone]. If 8PM-8AM, please contact night-coverage at [email or web address], password [email or web address] 07/05/2012, 8:30 AM  LOS: 10 days

## 2012-07-05 NOTE — Progress Notes (Signed)
ANTIBIOTIC CONSULT NOTE - FOLLOW UP  Pharmacy Consult for Vancomycin + Ceftazidime Indication: Pancreatic abscess   Allergies  Allergen Reactions  . Codeine Itching  . Morphine And Related Shortness Of Breath    SOB when given IV once "to me it was mild; I called out fast for help; never had to intubate" (05/23/2012)  . Ace Inhibitors Other (See Comments)    Unknown reaction but her physician stated that she can't take it (specifically lisinopril)  . Tylenol (Acetaminophen) Other (See Comments)    Patient states that the doctor told her she has a Fatty Liver.    Patient Measurements: Height: 5\' 4"  (162.6 cm) Weight: 116 lb 13.5 oz (53 kg) (standing) IBW/kg (Calculated) : 54.7   Vital Signs: Temp: 97.7 F (36.5 C) (01/21 2956) Temp src: Oral (01/21 2130) BP: 110/71 mmHg (01/21 0830) Pulse Rate: 78  (01/21 0830) Intake/Output from previous day: 01/20 0701 - 01/21 0700 In: 340 [P.O.:240; IV Piggyback:100] Out: -  Intake/Output from this shift:    Labs:  Basename 07/05/12 0701  WBC 12.9*  HGB 9.2*  PLT 189  LABCREA --  CREATININE 3.77*   Estimated Creatinine Clearance: 12.9 ml/min (by C-G formula based on Cr of 3.77).  Basename 07/05/12 0707  VANCOTROUGH --  Leodis Binet --  VANCORANDOM 13.2  GENTTROUGH --  GENTPEAK --  GENTRANDOM --  TOBRATROUGH --  TOBRAPEAK --  TOBRARND --  AMIKACINPEAK --  AMIKACINTROU --  AMIKACIN --     Microbiology: Recent Results (from the past 720 hour(s))  MRSA PCR SCREENING     Status: Normal   Collection Time   06/26/12  3:20 AM      Component Value Range Status Comment   MRSA by PCR NEGATIVE  NEGATIVE Final   URINE CULTURE     Status: Normal   Collection Time   06/27/12  6:36 AM      Component Value Range Status Comment   Specimen Description URINE, RANDOM   Final    Special Requests NONE   Final    Culture  Setup Time 06/27/2012 16:26   Final    Colony Count 40,000 COLONIES/ML   Final    Culture     Final    Value:  Multiple bacterial morphotypes present, none predominant. Suggest appropriate recollection if clinically indicated.   Report Status 06/28/2012 FINAL   Final   CULTURE, ROUTINE-ABSCESS     Status: Normal   Collection Time   06/28/12  4:26 PM      Component Value Range Status Comment   Specimen Description ABSCESS   Final    Special Requests LEFT RETROPERITONEAL FLUID   Final    Gram Stain     Final    Value: ABUNDANT WBC PRESENT,BOTH PMN AND MONONUCLEAR     FEW YEAST   Culture     Final    Value: FEW ENTEROCOCCUS SPECIES     FEW YEAST CONSISTENT WITH CANDIDA SPECIES   Report Status 07/05/2012 FINAL   Final    Organism ID, Bacteria ENTEROCOCCUS SPECIES   Final   ANAEROBIC CULTURE     Status: Normal   Collection Time   06/28/12  4:26 PM      Component Value Range Status Comment   Specimen Description ABSCESS   Final    Special Requests LEFT RETROPERITONEAL FLUID   Final    Gram Stain     Final    Value: ABUNDANT WBC PRESENT,BOTH PMN AND MONONUCLEAR  FEW YEAST   Culture NO ANAEROBES ISOLATED   Final    Report Status 07/03/2012 FINAL   Final   FUNGUS CULTURE W SMEAR     Status: Normal (Preliminary result)   Collection Time   06/28/12  4:26 PM      Component Value Range Status Comment   Specimen Description ABSCESS   Final    Special Requests LEFT RETROPERITONEAL FLUID   Final    Fungal Smear NO YEAST OR FUNGAL ELEMENTS SEEN   Final    Culture YEAST ISOLATED;ID TO FOLLOW   Final    Report Status PENDING   Incomplete     Anti-infectives     Start     Dose/Rate Route Frequency Ordered Stop   07/07/12 1200   vancomycin (VANCOCIN) 500 mg in sodium chloride 0.9 % 100 mL IVPB        500 mg 100 mL/hr over 60 Minutes Intravenous Every T-Th-Sa (Hemodialysis) 07/05/12 0845     07/05/12 1800   cefTAZidime (FORTAZ) 2 g in dextrose 5 % 50 mL IVPB        2 g 100 mL/hr over 30 Minutes Intravenous Every T-Th-Sa (1800) 07/04/12 1524     07/05/12 1200   vancomycin (VANCOCIN) 750 mg in sodium  chloride 0.9 % 150 mL IVPB        750 mg 150 mL/hr over 60 Minutes Intravenous Every Tue (Hemodialysis) 07/05/12 0845 07/12/12 1159   07/02/12 0000   dextrose 5 % SOLN 50 mL with cefTAZidime 2 G SOLR        100 mL/hr over 30 Minutes   07/02/12 1031     07/02/12 0000   sodium chloride 0.9 % SOLN 100 mL with vancomycin 500 MG SOLR        100 mL/hr over 60 Minutes   07/02/12 1031     07/02/12 0000   voriconazole (VFEND) 200 MG tablet        200 mg Oral Daily 07/02/12 1031     06/30/12 1800   cefTAZidime (FORTAZ) 2 g in dextrose 5 % 50 mL IVPB  Status:  Discontinued        2 g 100 mL/hr over 30 Minutes Intravenous Every T-Th-Sa (1800) 06/29/12 1831 07/04/12 1244   06/30/12 1200   vancomycin (VANCOCIN) 500 mg in sodium chloride 0.9 % 100 mL IVPB  Status:  Discontinued        500 mg 100 mL/hr over 60 Minutes Intravenous Every T-Th-Sa (Hemodialysis) 06/29/12 1831 07/05/12 0845   06/29/12 2000   vancomycin (VANCOCIN) 1,250 mg in sodium chloride 0.9 % 250 mL IVPB        1,250 mg 166.7 mL/hr over 90 Minutes Intravenous  Once 06/29/12 1831 06/30/12 0029   06/29/12 1930   cefTAZidime (FORTAZ) 1 g in dextrose 5 % 50 mL IVPB        1 g 100 mL/hr over 30 Minutes Intravenous  Once 06/29/12 1831 06/29/12 2049   06/29/12 1900   micafungin (MYCAMINE) 100 mg in sodium chloride 0.9 % 100 mL IVPB        100 mg 100 mL/hr over 1 Hours Intravenous Daily 06/29/12 1757     06/26/12 0100   sulfamethoxazole-trimethoprim (BACTRIM DS) 800-160 MG per tablet 2 tablet  Status:  Discontinued        2 tablet Oral Daily at bedtime 06/26/12 0050 06/29/12 1758   06/25/12 2315   sulfamethoxazole-trimethoprim (BACTRIM DS,SEPTRA DS) 800-160 MG per tablet 1 tablet  Status:  Discontinued        1 tablet Oral 2 times daily 06/25/12 2301 06/26/12 0048          Assessment: 62 y.o F with ESRD who presented to the [facility] on 06/25/12 with abdominal pain. The patient has a PMH significant for chronic/necrotizing pancreatitis  with hx pancreatic pseudocyst. IR was consulted and and were able to aspirate the left perisplenic / retroperitoneal fluid and gas collection -- which has been sent for culturing. ID has consulted pharmacy to dose Vancomycin + Ceftazidime along with Micafungin per MD for empiric coverage while awaiting culture results. Cultures thus far show enterococcus (S-amp/Vanc) and yeast. Pt with known history of recent infection with KPC producing Enterobacter and fungemia with Candida parapsilosis.   The patient receives HD on T/Th/Sat. The patient was loaded with Vancomycin on 1/15 and received appropriate maintenance doses after tolerating full HD sessions on 1/16 and 1/18. A pre-HD Vancomycin level today was slightly SUBtherapeutic (Vanc level~13, goal of 15-25 mcg/ml). Will give a higher dose of Vancomycin post-HD today, and then continue with normal maintenance doses. Ceftazidime doses remain appropriate.  Goal of Therapy:  Pre-HD Vancomycin level of 15-25 mcg/ml  Plan:  1. Vancomycin 750 mg IV post HD session today (1/21) 2. Continue Vancomycin 500 mg IV post HD sessions on T/Th/Sat (starting back on Thurs, 1/23) 3. Continue Ceftazidime 2g IV post HD sessions on T/Th/Sat 4. Will continue to follow HD schedule/duration, culture results, LOT, and antibiotic de-escalation plans   Georgina Pillion, PharmD, BCPS Clinical Pharmacist Pager: [phone] 07/05/2012 8:53 AM

## 2012-07-05 NOTE — Care Management Note (Addendum)
Able to reach pt social worker at [facility] clinic re need for Voricomazole po in order for pt to be able to d/c from the hospital. Spoke with Gaston Islam pt's social worker at the [facility], info faxed to him and he will deliver to pt MD, Dr Miguel Aschoff. This CM was also informed that this process may take 2-3 days. Dr Lavera Guise notified.  Johny Shock RN MPH Case Manager [phone]  3:45 pm received call from [facility] social worker,for Dr Robinette Haines. Per social worker, [name], Dr Robinette Haines is not willing to write prescriptions for this pt before meeting with the patient and reviewing all medications due to the complexity of this patient.  Ms Para March provided this CM with the extention for Dr Amore's nurse per this CM request in the hope of being able to connected pt attending MD at [facility], Dr Lavera Guise with Dr Robinette Haines to discuss this pt needs. The number provided was not answered and would not allow Dr Lavera Guise to leave a message. This CM again called Ms Para March the social worker and left a message then called the service number [phone] and was help by a provider who tried to send a message to Dr Amore's nurse , however there was no response to the messages. She did leave a message which will hopefully be responded to by Dr Robinette Haines or his nurse. The plan is to hopefully connect the attending [facility] to discuss pt medication needs. Currently the pt would need the medication , Voriconazole prior to her d/c from the hospital. Will continue to work on this issue.  Johny Shock RN MPH Case Manager [phone]

## 2012-07-05 NOTE — Progress Notes (Signed)
[facility] Progress Note  Subjective:   Seen on HD. Complaining of 9/10 abdominal pain and nausea which she attributes to "too much pressing" by the medical team yesterday.  Some nausea but no other complaints.  Objective Filed Vitals:   07/05/12 0930 07/05/12 1000 07/05/12 1030 07/05/12 1055  BP: 106/69 109/64 101/63 104/66  Pulse: 71 75 74 64  Temp:    97.9 F (36.6 C)  TempSrc:    Oral  Resp:    18  Height:      Weight:    51.3 kg (113 lb 1.5 oz)  SpO2:    96%   Physical Exam General: Chronically appearing but alert, cooperative, NAD Heart: RRR, AICD left chest Lungs: CTA bilaterally. No wheezes, rales or rhonchi noted. Abdomen: Soft, mildly tender, trace distension Extremities: No LE edema Dialysis Access: Rt-IJ catheter in use.  LFA AVG s/p ligation  Outpatient HD  [facility] On TTS, 4 hrs, Edw= 53.5 kg, heparin , EPO 1500 units q hd, no vit d last pth=178.2 (05/05/12) Bath= 3.0 k, 2.5 Ca, 1.0 mg Was on VAncomycin q hd and Fortaz 2gm qhd since 12/14./13 TFS 13% Iron load order X 10 x 06/13/12 ( needing 7 more doses    Assessment/Plan: 1. Abdominal Pain With Chronic Pancreatitis, question pancreatic abcess; s/p IR aspiration 1/14- gram stain showing yeast and enterococcus. Dr. Ninetta Lights to follow in ID clinic when dc, Awaiting Isolate Yeast Organism per ID/on micafungin/vanc/fortaz and Availability for [facility] to [facility] med (po voriconazole). 2. Hypomagnesia - Resolved. Mg oxide d/c'd 3. Ascites, s/p therapeutic paracentesis 3L 1/17 4. ESRD, cont tts. K+ 4.7, WANTS to change to closer kidney center after dc to [facility] - Awaiting decision from clinical manager Glenda Chroman, who agrees to review census and consult with the Wellsite geologist.  5. HD Access - Scheduled at [facility] med center for new R avgg Dr. Hollace Hayward 07/13/12 6. Anemia - Hgb improving at 9.2 today. On aranesp 100, Continue infed. 7. Secondary hyperparathyroidism - Ca high 12.9 Corrected (These were  also in the 11's at OP center)  Will recheck PTH.  phos 3.7; holding Renvela, no vit D and low Ca bath w HD.  8. HTN/volume - SBPs 100's. On Coreg 3.125mg  BID, Imdur 30 mg QD and  Hydralazine 10 mg TID. ~2k under EDW. Tolerating hd uf so far, continue to challenge. Hx of cirrhosis / ascites / banding of esoph varices / portal vein and splenic vein thrombosis. Also related to her low alb and liver dz state.  9. Nutrition - Albumin 1.7 - Continues to eat sparingly given GI issues. Continue high protein renal diet, nepro, multivitamin 10. CM ( 10 % EF) / AICD 11. DNR 12. Dispo - Discharge home once PO voriconazole arrives  Scot Jun. Thad Ranger [facility] [phone] pager 07/05/2012,11:09 AM  LOS: 10 days  I have seen and examined this patient and agree with plan as outlined above. Appears [facility] will supply voriconazole but apparently may take a few days . Dialysis today. Checking PTH.  (Hypercalcemia disproportionate to the degree of PTH elevation noted in Nov at outpt unit).  Not on Vit D, calcium. May need further w/u.  Cornelious Diven B,MD 07/05/2012 1:12 PM   Additional Objective Labs: Basic Metabolic Panel:  Lab 07/05/12 6962 07/02/12 0716 06/30/12 1430  NA 128* 128* 130*  K 4.7 3.6 3.5  CL 92* 91* 93*  CO2 26 28 27   GLUCOSE 68* 87 138*  BUN 42*  29* 32*  CREATININE 3.77* 3.18* 3.51*  CALCIUM 11.0* 10.6* 10.9*  ALB -- -- --  PHOS 4.1 3.7 4.4   Liver Function Tests:  Lab 07/05/12 0701 07/02/12 0716 06/30/12 1430  AST -- -- --  ALT -- -- --  ALKPHOS -- -- --  BILITOT -- -- --  PROT -- -- --  ALBUMIN 1.6* 1.7* 1.8*   CBC:  Lab 07/05/12 0701 07/02/12 0716 06/30/12 1430  WBC 12.9* 10.2 11.2*  NEUTROABS -- -- --  HGB 9.2* 8.9* 8.6*  HCT 30.1* 29.0* 27.2*  MCV 89.6 90.1 88.9  PLT 189 161 159   Blood Culture    Component Value Date/Time   SDES ABSCESS 06/28/2012 1626   SDES ABSCESS 06/28/2012 1626   SDES ABSCESS 06/28/2012 1626   SPECREQUEST LEFT  RETROPERITONEAL FLUID 06/28/2012 1626   SPECREQUEST LEFT RETROPERITONEAL FLUID 06/28/2012 1626   SPECREQUEST LEFT RETROPERITONEAL FLUID 06/28/2012 1626   CULT  Value: FEW ENTEROCOCCUS SPECIES FEW YEAST CONSISTENT WITH CANDIDA SPECIES 06/28/2012 1626   CULT NO ANAEROBES ISOLATED 06/28/2012 1626   CULT YEAST ISOLATED;ID TO FOLLOW 06/28/2012 1626   REPTSTATUS 07/05/2012 FINAL 06/28/2012 1626   REPTSTATUS 07/03/2012 FINAL 06/28/2012 1626   REPTSTATUS PENDING 06/28/2012 1626    Medications:      . aspirin EC  81 mg Oral Daily  . carvedilol  3.125 mg Oral BID WC  . cefTAZidime (FORTAZ)  IV  2 g Intravenous Q T,Th,Sat-1800  . darbepoetin  100 mcg Intravenous Q Sat-HD  . docusate sodium  100 mg Oral QHS  . feeding supplement (NEPRO CARB STEADY)  237 mL Oral BID WC  . hydrALAZINE  10 mg Oral Q8H  . HYDROmorphone  4 mg Oral Q4H  . iron dextran (INFED/DEXFERRUM) infusion  100 mg Intravenous Q T,Th,Sa-HD  . isosorbide mononitrate  30 mg Oral Daily  . methadone  10 mg Oral TID  . micafungin [facility]) IV  100 mg Intravenous Daily  . multivitamin  1 tablet Oral QHS  . pantoprazole  40 mg Oral Daily  . ranolazine  500 mg Oral Daily  . sevelamer  400 mg Oral Q breakfast  . simvastatin  10 mg Oral q1800  . sodium chloride  3 mL Intravenous Q12H  . vancomycin  500 mg Intravenous Q T,Th,Sa-HD

## 2012-07-06 DIAGNOSIS — K859 Acute pancreatitis without necrosis or infection, unspecified: Secondary | ICD-10-CM

## 2012-07-06 DIAGNOSIS — K7460 Unspecified cirrhosis of liver: Secondary | ICD-10-CM

## 2012-07-06 LAB — PARATHYROID HORMONE, INTACT (NO CA): PTH: 88.2 pg/mL — ABNORMAL HIGH (ref 14.0–72.0)

## 2012-07-06 NOTE — Progress Notes (Signed)
INFECTIOUS DISEASE PROGRESS NOTE  ID: [name] is a 62 y.o. female with   Principal Problem:  *Infected pseudocyst of pancreas Active Problems:  Ischemic cardiomyopathy, EF 20-25% echo April 2013, now 10% by cath (05/06/12)  Chronic systolic congestive heart failure, NYHA class 3  LBBB (left bundle branch block)  Hypertension  Pancreatitis, chronic  Anemia in chronic kidney disease (CKD)  History of Portal vein thrombosis  Ascites  ESRD on hemodialysis  ICD (implantable cardiac defibrillator) in place, MDT placed Jan 2013 [facility] [place]  Subjective: Without complaints.   Abtx:  Anti-infectives     Start     Dose/Rate Route Frequency Ordered Stop   07/07/12 1200   vancomycin (VANCOCIN) 500 mg in sodium chloride 0.9 % 100 mL IVPB        500 mg 100 mL/hr over 60 Minutes Intravenous Every T-Th-Sa (Hemodialysis) 07/05/12 0845     07/05/12 1800   cefTAZidime (FORTAZ) 2 g in dextrose 5 % 50 mL IVPB        2 g 100 mL/hr over 30 Minutes Intravenous Every T-Th-Sa (1800) 07/04/12 1524     07/05/12 1200   vancomycin (VANCOCIN) 750 mg in sodium chloride 0.9 % 150 mL IVPB        750 mg 150 mL/hr over 60 Minutes Intravenous Every Tue (Hemodialysis) 07/05/12 0845 07/05/12 1039   07/05/12 0000   voriconazole (VFEND) 200 MG tablet        200 mg Oral 2 times daily 07/05/12 1130     07/05/12 0000   dextrose 5 % SOLN 50 mL with cefTAZidime 2 G SOLR        100 mL/hr over 30 Minutes   07/05/12 1130     07/05/12 0000   sodium chloride 0.9 % SOLN 100 mL with vancomycin 500 MG SOLR        100 mL/hr over 60 Minutes   07/05/12 1130     07/02/12 0000   dextrose 5 % SOLN 50 mL with cefTAZidime 2 G SOLR        100 mL/hr over 30 Minutes   07/02/12 1031     07/02/12 0000   sodium chloride 0.9 % SOLN 100 mL with vancomycin 500 MG SOLR        100 mL/hr over 60 Minutes   07/02/12 1031     07/02/12 0000   voriconazole (VFEND) 200 MG tablet  Status:  Discontinued        200 mg Oral Daily 07/02/12  1031 07/05/12    06/30/12 1800   cefTAZidime (FORTAZ) 2 g in dextrose 5 % 50 mL IVPB  Status:  Discontinued        2 g 100 mL/hr over 30 Minutes Intravenous Every T-Th-Sa (1800) 06/29/12 1831 07/04/12 1244   06/30/12 1200   vancomycin (VANCOCIN) 500 mg in sodium chloride 0.9 % 100 mL IVPB  Status:  Discontinued        500 mg 100 mL/hr over 60 Minutes Intravenous Every T-Th-Sa (Hemodialysis) 06/29/12 1831 07/05/12 0845   06/29/12 2000   vancomycin (VANCOCIN) 1,250 mg in sodium chloride 0.9 % 250 mL IVPB        1,250 mg 166.7 mL/hr over 90 Minutes Intravenous  Once 06/29/12 1831 06/30/12 0029   06/29/12 1930   cefTAZidime (FORTAZ) 1 g in dextrose 5 % 50 mL IVPB        1 g 100 mL/hr over 30 Minutes Intravenous  Once 06/29/12 1831 06/29/12 2049  06/29/12 1900   micafungin (MYCAMINE) 100 mg in sodium chloride 0.9 % 100 mL IVPB        100 mg 100 mL/hr over 1 Hours Intravenous Daily 06/29/12 1757     06/26/12 0100   sulfamethoxazole-trimethoprim (BACTRIM DS) 800-160 MG per tablet 2 tablet  Status:  Discontinued        2 tablet Oral Daily at bedtime 06/26/12 0050 06/29/12 1758   06/25/12 2315   sulfamethoxazole-trimethoprim (BACTRIM DS,SEPTRA DS) 800-160 MG per tablet 1 tablet  Status:  Discontinued        1 tablet Oral 2 times daily 06/25/12 2301 06/26/12 0048          Medications:  Scheduled:   . aspirin EC  81 mg Oral Daily  . carvedilol  3.125 mg Oral BID WC  . cefTAZidime (FORTAZ)  IV  2 g Intravenous Q T,Th,Sat-1800  . darbepoetin  100 mcg Intravenous Q Sat-HD  . docusate sodium  100 mg Oral QHS  . feeding supplement (NEPRO CARB STEADY)  237 mL Oral BID WC  . hydrALAZINE  10 mg Oral Q8H  . HYDROmorphone  4 mg Oral Q4H  . iron dextran (INFED/DEXFERRUM) infusion  100 mg Intravenous Q T,Th,Sa-HD  . isosorbide mononitrate  30 mg Oral Daily  . methadone  10 mg Oral TID  . micafungin [facility]) IV  100 mg Intravenous Daily  . multivitamin  1 tablet Oral QHS  . pantoprazole   40 mg Oral Daily  . ranolazine  500 mg Oral Daily  . sevelamer  400 mg Oral Q breakfast  . simvastatin  10 mg Oral q1800  . sodium chloride  3 mL Intravenous Q12H  . vancomycin  500 mg Intravenous Q T,Th,Sa-HD    Objective: Vital signs in last 24 hours: Temp:  [97.6 F (36.4 C)-98.5 F (36.9 C)] 97.6 F (36.4 C) (01/22 1649) Pulse Rate:  [76-88] 81  (01/22 1649) Resp:  [16-17] 17  (01/22 1649) BP: (117-126)/(80-82) 123/82 mmHg (01/22 1649) SpO2:  [98 %-99 %] 99 % (01/22 1649) Weight:  [115 lb 15.4 oz (52.6 kg)] 115 lb 15.4 oz (52.6 kg) (01/21 2157)   General appearance: alert, cooperative and no distress GI: normal findings: bowel sounds normal and soft and abnormal findings:  distended and mild tenderness,  no rebound or gaurding.   Lab Results  [facility] 07/05/12 0701  WBC 12.9*  HGB 9.2*  HCT 30.1*  NA 128*  K 4.7  CL 92*  CO2 26  BUN 42*  CREATININE 3.77*  GLU --   Liver Panel  Basename 07/05/12 0701  PROT --  ALBUMIN 1.6*  AST --  ALT --  ALKPHOS --  BILITOT --  BILIDIR --  IBILI --   Sedimentation Rate No results found for this basename: ESRSEDRATE in the last 72 hours C-Reactive Protein No results found for this basename: CRP:2 in the last 72 hours  Microbiology: Recent Results (from the past 240 hour(s))  URINE CULTURE     Status: Normal   Collection Time   06/27/12  6:36 AM      Component Value Range Status Comment   Specimen Description URINE, RANDOM   Final    Special Requests NONE   Final    Culture  Setup Time 06/27/2012 16:26   Final    Colony Count 40,000 COLONIES/ML   Final    Culture     Final    Value: Multiple bacterial morphotypes present, none predominant. Suggest appropriate recollection if  clinically indicated.   Report Status 06/28/2012 FINAL   Final   CULTURE, ROUTINE-ABSCESS     Status: Normal   Collection Time   06/28/12  4:26 PM      Component Value Range Status Comment   Specimen Description ABSCESS   Final    Special  Requests LEFT RETROPERITONEAL FLUID   Final    Gram Stain     Final    Value: ABUNDANT WBC PRESENT,BOTH PMN AND MONONUCLEAR     FEW YEAST   Culture     Final    Value: FEW ENTEROCOCCUS SPECIES     FEW YEAST CONSISTENT WITH CANDIDA SPECIES   Report Status 07/05/2012 FINAL   Final    Organism ID, Bacteria ENTEROCOCCUS SPECIES   Final   ANAEROBIC CULTURE     Status: Normal   Collection Time   06/28/12  4:26 PM      Component Value Range Status Comment   Specimen Description ABSCESS   Final    Special Requests LEFT RETROPERITONEAL FLUID   Final    Gram Stain     Final    Value: ABUNDANT WBC PRESENT,BOTH PMN AND MONONUCLEAR     FEW YEAST   Culture NO ANAEROBES ISOLATED   Final    Report Status 07/03/2012 FINAL   Final   FUNGUS CULTURE W SMEAR     Status: Normal (Preliminary result)   Collection Time   06/28/12  4:26 PM      Component Value Range Status Comment   Specimen Description ABSCESS   Final    Special Requests LEFT RETROPERITONEAL FLUID   Final    Fungal Smear NO YEAST OR FUNGAL ELEMENTS SEEN   Final    Culture YEAST ISOLATED;ID TO FOLLOW   Final    Report Status PENDING   Incomplete     Studies/Results: No results found.   Assessment/Plan: Pancreatic abscess (enterococcus- pan sens, yeast)  Cirrhosis  ESRD  Await yeast ID, sensi (must be requested- diflucan, ampho, mycafungin)  Will continue her vanco and ceftaz with HD.  Will cont her mycafungin while in hospital. Plan for her to be d/c on voriconazole.  Would plan on her getting at least 1 month of therapy. 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 She will need surgery (which notes states she has refused) for a chance a cure. Upon asking about surgery, she states she does not think she will survive due to her heart.   Day 7 anbx (ceftaz, vanco, mycafungin)  Happy to see in ID clinic for f/u  Johny Sax Infectious Diseases [phone] 07/06/2012, 6:16  PM   LOS: 11 days

## 2012-07-06 NOTE — Progress Notes (Signed)
NUTRITION FOLLOW UP  Intervention:   1. Nutrition Ambassador to adhere to patient's foods preferences 2. Continue Nepro Shakes as able 3. RD to continue to follow nutrition care plan  DOCUMENTATION CODES  Per approved criteria   -Severe malnutrition in the context of chronic illness    Nutrition Dx:   Inadequate oral intake related to abdominal pain 2/2 chronic pancreatitis as evidenced by ongoing weight loss.   Goal:   Consume 50% of meals and supplements.  Monitor:   Weights, labs, PO intake, I/O's  Assessment:   Underwent CT-guided aspiration of pancreatic abscess on 1/14. Results currently pending.  Current intake is 10-25% of meals. Potassium and phosphorus are WNL at this time. Sodium is trending down and currently low. Continues with Nepro po BID, states that she is able to take it at least twice daily, sometimes up to three times a day.   Remains in patient while discharge medications are being sorted out.   Height: Ht Readings from Last 1 Encounters:  06/26/12 5\' 4"  (1.626 m)    Weight Status:  Trending down with fluid removal from HD and paracentesis Wt Readings from Last 1 Encounters:  07/05/12 115 lb 15.4 oz (52.6 kg)  113 lb 1.5 oz s/p HD on 1/21 121 lb 0.5 oz s/p HD on 1/16  Re-estimated needs:  Kcal: 1600 - 1800 kcal Protein: 80 - 90 grams protein Fluid: 1.2 liters daily  Skin: intact  Diet Order: Renal 80-90; 1200 ml fluid restriction   Intake/Output Summary (Last 24 hours) at 07/06/12 1019 Last data filed at 07/06/12 1610  Gross per 24 hour  Intake   1083 ml  Output   1939 ml  Net   -856 ml    Last BM: 1/15   Labs:   Lab 07/05/12 0701 07/02/12 0716 06/30/12 1430  NA 128* 128* 130*  K 4.7 3.6 3.5  CL 92* 91* 93*  CO2 26 28 27   BUN 42* 29* 32*  CREATININE 3.77* 3.18* 3.51*  CALCIUM 11.0* 10.6* 10.9*  MG -- -- --  PHOS 4.1 3.7 4.4  GLUCOSE 68* 87 138*    CBG (last 3)  No results found for this basename: GLUCAP:3 in the last 72  hours  Scheduled Meds:    . aspirin EC  81 mg Oral Daily  . carvedilol  3.125 mg Oral BID WC  . cefTAZidime (FORTAZ)  IV  2 g Intravenous Q T,Th,Sat-1800  . darbepoetin  100 mcg Intravenous Q Sat-HD  . docusate sodium  100 mg Oral QHS  . feeding supplement (NEPRO CARB STEADY)  237 mL Oral BID WC  . hydrALAZINE  10 mg Oral Q8H  . HYDROmorphone  4 mg Oral Q4H  . iron dextran (INFED/DEXFERRUM) infusion  100 mg Intravenous Q T,Th,Sa-HD  . isosorbide mononitrate  30 mg Oral Daily  . methadone  10 mg Oral TID  . micafungin [facility]) IV  100 mg Intravenous Daily  . multivitamin  1 tablet Oral QHS  . pantoprazole  40 mg Oral Daily  . ranolazine  500 mg Oral Daily  . sevelamer  400 mg Oral Q breakfast  . simvastatin  10 mg Oral q1800  . sodium chloride  3 mL Intravenous Q12H  . vancomycin  500 mg Intravenous Q T,Th,Sa-HD    Continuous Infusions:   Jarold Motto MS, RD, LDN Pager: [phone] After-hours pager: [phone]

## 2012-07-06 NOTE — Care Management Note (Signed)
Spoke with Hoy Register, [facility] for [facility]. Per Joey this CM instructed to fax ID [facility] with [facility] and to fax prescription to [facility] pharmacist , [facility] and ask for overnight delivery to pt home . Pt informed and will ask her stepdaughter to watch for this medication. This CM also asked for call back and to be notified of delivery time.  Johny Shock RN MPH Case Manager [phone]

## 2012-07-06 NOTE — Progress Notes (Signed)
[facility] PROGRESS NOTE Assessment/Plan: Infected pseudocyst of pancreas/Portal vein thrombosis (02/24/2012):  - complex infected pseudoocyst with pancreatic necrosis last admission 05-22-2012. She was treated with IV antibiotics ceftazidime, and Vancomycin until 06/07/12. - aspirate culture from pancreatic abscess pending - enterococcus sensitive to ampicillin and vanc. Per ID - CT scan 01-12: Changes consistent with necrotizing pancreatitis and peripancreatic abscesses with gas. Ascites. Abnormal hepatic parenchymal perfusion with diffuse progressive portal venous thrombosis. -Surgery consulted. Patient refused pancreatectomy   - pain controlled with adjusted methadone. - recurrent paracentesis by IR 07/01/12 with 3 liters removed  - started on empiric abx 06/29/12 and ID managing. ? duration and ? Which Antibiotics. - voriconazole for home.  ESRD:  - Continue with Dialysis. Correction of electrolytes during dialysis. No complaint with dialysis, missed last 2 session prior to admission.   Ischemic cardiomyopathy, EF 20-25% echo April 2013, now 10% by cath (05/06/12): - Continue with Zocor, IMDUR, Coreg, aspirin.   Hypertension (12/01/2010) - stable.   Anemia in chronic kidney disease (CKD) (10/13/2011) - hbg stable  Code Status: full.  Family Communication: Care discussed with patient.  Disposition Plan: home     Consultants:  ID  nephrology  Procedures:  none  Antibiotics: Vanc 1/15  Fortaz 1/15  Micafungin 1/15  HPI/Subjective: No complains  Objective: Filed Vitals:   07/05/12 1257 07/05/12 1659 07/05/12 2157 07/06/12 0932  BP: 107/71 115/76 117/81 126/80  Pulse: 92 81 76 88  Temp: 97.4 F (36.3 C) 98.4 F (36.9 C) 98.2 F (36.8 C) 98.5 F (36.9 C)  TempSrc:   Oral   Resp: 16 15 16 17   Height:      Weight:   52.6 kg (115 lb 15.4 oz)   SpO2: 98% 100% 98% 99%    Intake/Output Summary (Last 24 hours) at 07/06/12 1050 Last data filed at 07/06/12  0938  Gross per 24 hour  Intake   1083 ml  Output   1939 ml  Net   -856 ml   Filed Weights   07/05/12 0632 07/05/12 1055 07/05/12 2157  Weight: 53 kg (116 lb 13.5 oz) 51.3 kg (113 lb 1.5 oz) 52.6 kg (115 lb 15.4 oz)    Exam:  General: Alert, awake, oriented x3, in no acute distress.  HEENT: No bruits, no goiter.  Heart: Regular rate and rhythm, without murmurs, rubs, gallops.  Lungs: Good air movement, clear to auscultation. Abdomen: Soft, nontender, nondistended, positive bowel sounds.  Neuro: Grossly intact, nonfocal.   Data Reviewed: Basic Metabolic Panel:  Lab 07/05/12 1610 07/02/12 0716 06/30/12 1430  NA 128* 128* 130*  K 4.7 3.6 3.5  CL 92* 91* 93*  CO2 26 28 27   GLUCOSE 68* 87 138*  BUN 42* 29* 32*  CREATININE 3.77* 3.18* 3.51*  CALCIUM 11.0* 10.6* 10.9*  MG -- -- --  PHOS 4.1 3.7 4.4   Liver Function Tests:  Lab 07/05/12 0701 07/02/12 0716 06/30/12 1430  AST -- -- --  ALT -- -- --  ALKPHOS -- -- --  BILITOT -- -- --  PROT -- -- --  ALBUMIN 1.6* 1.7* 1.8*   No results found for this basename: LIPASE:5,AMYLASE:5 in the last 168 hours No results found for this basename: AMMONIA:5 in the last 168 hours CBC:  Lab 07/05/12 0701 07/02/12 0716 06/30/12 1430  WBC 12.9* 10.2 11.2*  NEUTROABS -- -- --  HGB 9.2* 8.9* 8.6*  HCT 30.1* 29.0* 27.2*  MCV 89.6 90.1 88.9  PLT 189 161 159   Cardiac  Enzymes: No results found for this basename: CKTOTAL:5,CKMB:5,CKMBINDEX:5,TROPONINI:5 in the last 168 hours BNP (last 3 results)  Basename 06/02/12 1944 05/08/12 0515 04/21/12 1419  PROBNP >70000.0* >70000.0* >70000.0*   CBG: No results found for this basename: GLUCAP:5 in the last 168 hours  Recent Results (from the past 240 hour(s))  URINE CULTURE     Status: Normal   Collection Time   06/27/12  6:36 AM      Component Value Range Status Comment   Specimen Description URINE, RANDOM   Final    Special Requests NONE   Final    Culture  Setup Time 06/27/2012  16:26   Final    Colony Count 40,000 COLONIES/ML   Final    Culture     Final    Value: Multiple bacterial morphotypes present, none predominant. Suggest appropriate recollection if clinically indicated.   Report Status 06/28/2012 FINAL   Final   CULTURE, ROUTINE-ABSCESS     Status: Normal   Collection Time   06/28/12  4:26 PM      Component Value Range Status Comment   Specimen Description ABSCESS   Final    Special Requests LEFT RETROPERITONEAL FLUID   Final    Gram Stain     Final    Value: ABUNDANT WBC PRESENT,BOTH PMN AND MONONUCLEAR     FEW YEAST   Culture     Final    Value: FEW ENTEROCOCCUS SPECIES     FEW YEAST CONSISTENT WITH CANDIDA SPECIES   Report Status 07/05/2012 FINAL   Final    Organism ID, Bacteria ENTEROCOCCUS SPECIES   Final   ANAEROBIC CULTURE     Status: Normal   Collection Time   06/28/12  4:26 PM      Component Value Range Status Comment   Specimen Description ABSCESS   Final    Special Requests LEFT RETROPERITONEAL FLUID   Final    Gram Stain     Final    Value: ABUNDANT WBC PRESENT,BOTH PMN AND MONONUCLEAR     FEW YEAST   Culture NO ANAEROBES ISOLATED   Final    Report Status 07/03/2012 FINAL   Final   FUNGUS CULTURE W SMEAR     Status: Normal (Preliminary result)   Collection Time   06/28/12  4:26 PM      Component Value Range Status Comment   Specimen Description ABSCESS   Final    Special Requests LEFT RETROPERITONEAL FLUID   Final    Fungal Smear NO YEAST OR FUNGAL ELEMENTS SEEN   Final    Culture YEAST ISOLATED;ID TO FOLLOW   Final    Report Status PENDING   Incomplete      Studies: No results found.  Scheduled Meds:    . aspirin EC  81 mg Oral Daily  . carvedilol  3.125 mg Oral BID WC  . cefTAZidime (FORTAZ)  IV  2 g Intravenous Q T,Th,Sat-1800  . darbepoetin  100 mcg Intravenous Q Sat-HD  . docusate sodium  100 mg Oral QHS  . feeding supplement (NEPRO CARB STEADY)  237 mL Oral BID WC  . hydrALAZINE  10 mg Oral Q8H  . HYDROmorphone  4  mg Oral Q4H  . iron dextran (INFED/DEXFERRUM) infusion  100 mg Intravenous Q T,Th,Sa-HD  . isosorbide mononitrate  30 mg Oral Daily  . methadone  10 mg Oral TID  . micafungin [facility]) IV  100 mg Intravenous Daily  . multivitamin  1 tablet Oral QHS  . pantoprazole  40 mg Oral Daily  . ranolazine  500 mg Oral Daily  . sevelamer  400 mg Oral Q breakfast  . simvastatin  10 mg Oral q1800  . sodium chloride  3 mL Intravenous Q12H  . vancomycin  500 mg Intravenous Q T,Th,Sa-HD   Continuous Infusions:    Marinda Elk  [facility] Pager [phone]. If 8PM-8AM, please contact night-coverage at [email or web address], password [facility] 07/06/2012, 10:50 AM  LOS: 11 days

## 2012-07-06 NOTE — Care Management Note (Signed)
Return call received from [place] social worker, [name] and this CM spoke with Dr Marcille Blanco who has agreed to sign off on 5 days of Voriconazole. [facility] home. The pt is aware and will have her neighbor watch for this delivery. [facility] will [facility] only after the pt notes have been reviewed by their ID physician. This CM will fax info tomorrow am for their review.  Johny Shock RN MPH Case Manager [phone]

## 2012-07-06 NOTE — Progress Notes (Signed)
Physical Therapy Treatment Patient Details Name: [name] MRN: [id number] DOB: 1950/10/16 Today's Date: 07/06/2012 Time: 4098-1191 PT Time Calculation (min): 35 min  PT Assessment / Plan / Recommendation Comments on Treatment Session  Pt demonstrated safe use of rollator which she will be using when she goes home. Pt's tolerance for mobility is improving, 250' with rollator and supervision.  PT will follow, continue to rec HHPT at d/c.    Follow Up Recommendations  Home health PT     Does the patient have the potential to tolerate intense rehabilitation     Barriers to Discharge        Equipment Recommendations  Other (comment) (rollator)    Recommendations for Other Services    Frequency Min 3X/week   Plan Discharge plan remains appropriate;Frequency remains appropriate    Precautions / Restrictions Precautions Precautions: Fall Precaution Comments: fall risk is minimized with RW use Restrictions Weight Bearing Restrictions: No   Pertinent Vitals/Pain 9/10 abdominal pain, decreased after pain meds given    Mobility  Bed Mobility Bed Mobility: Supine to Sit;Sitting - Scoot to Edge of Bed Supine to Sit: 6: Modified independent (Device/Increase time) Sitting - Scoot to Edge of Bed: 6: Modified independent (Device/Increase time) Details for Bed Mobility Assistance: increased time needed but no physical assist Transfers Transfers: Sit to Stand;Stand to Sit Sit to Stand: 5: Supervision;From bed;From chair/3-in-1;With upper extremity assist Stand to Sit: 5: Supervision;To chair/3-in-1;With upper extremity assist Details for Transfer Assistance: practiced sit to stand and stand to sit multiple times from rollator to get practice with setting brakes and get the feel of the AD. Pt safe with transfers to and from it. Ambulation/Gait Ambulation/Gait Assistance: 5: Supervision Ambulation Distance (Feet): 250 Feet Assistive device: Other (Comment) (rollator) Ambulation/Gait  Assistance Details: pt felt good with rollator, vc's for erect posture Gait Pattern: Step-through pattern Gait velocity: pt cautious with gait, decreasing speed, able to increase to close to [facility] with cues Stairs: No Wheelchair Mobility Wheelchair Mobility: No    Exercises Other Exercises Other Exercises: chest stretch x 30 sec Other Exercises: shoulder retraction, 15x, 3x/ day   PT Diagnosis:    PT Problem List:   PT Treatment Interventions:     PT Goals Acute Rehab PT Goals PT Goal Formulation: With patient Time For Goal Achievement: 07/13/12 Potential to Achieve Goals: Good Pt will go Supine/Side to Sit: with modified independence PT Goal: Supine/Side to Sit - Progress: Progressing toward goal Pt will go Sit to Supine/Side: with modified independence PT Goal: Sit to Supine/Side - Progress: Progressing toward goal Pt will go Sit to Stand: with modified independence PT Goal: Sit to Stand - Progress: Progressing toward goal Pt will go Stand to Sit: with modified independence PT Goal: Stand to Sit - Progress: Progressing toward goal Pt will Ambulate: >150 feet;with modified independence;with rolling walker PT Goal: Ambulate - Progress: Progressing toward goal  Visit Information  Last PT Received On: 07/06/12 Assistance Needed: +1    Subjective Data  Subjective: agreeable to ambulation after pain meds given Patient Stated Goal: get better and go home   Cognition  Overall Cognitive Status: Appears within functional limits for tasks assessed/performed Arousal/Alertness: Awake/alert Orientation Level: Appears intact for tasks assessed Behavior During Session: [facility] for tasks performed    Balance  Balance Balance Assessed: Yes Dynamic Standing Balance Dynamic Standing - Balance Support: Bilateral upper extremity supported;During functional activity Dynamic Standing - Level of Assistance: 5: Stand by assistance Dynamic Standing - Comments: pt's balance sufficient to  move hand  from handlebars to brakes of rollator and set brakes without LOB  End of Session PT - End of Session Activity Tolerance: Patient tolerated treatment well Patient left: in chair;with call bell/phone within reach Nurse Communication: Mobility status   GP   Lyanne Co, PT  Acute Rehab Services  [phone]   Lyanne Co 07/06/2012, 5:02 PM

## 2012-07-06 NOTE — Progress Notes (Addendum)
Subjective:  Eating Breakfast and talking to Pharmacy about [facility]s from [facility]/ tolerated HD yesterday Objective Vital signs in last 24 hours: Filed Vitals:   07/05/12 1130 07/05/12 1257 07/05/12 1659 07/05/12 2157  BP: 105/72 107/71 115/76 117/81  Pulse: 79 92 81 76  Temp: 98.9 F (37.2 C) 97.4 F (36.3 C) 98.4 F (36.9 C) 98.2 F (36.8 C)  TempSrc:    Oral  Resp: 17 16 15 16   Height:      Weight:    52.6 kg (115 lb 15.4 oz)  SpO2: 97% 98% 100% 98%   Weight change: -1.7 kg (-3 lb 12 oz)  Intake/Output Summary (Last 24 hours) at 07/06/12 0842 Last data filed at 07/06/12 0300  Gross per 24 hour  Intake    720 ml  Output   1939 ml  Net  -1219 ml   Labs: Basic Metabolic Panel:  Lab 07/05/12 1610 07/02/12 0716 06/30/12 1430  NA 128* 128* 130*  K 4.7 3.6 3.5  CL 92* 91* 93*  CO2 26 28 27   GLUCOSE 68* 87 138*  BUN 42* 29* 32*  CREATININE 3.77* 3.18* 3.51*  CALCIUM 11.0* 10.6* 10.9*  ALB -- -- --  PHOS 4.1 3.7 4.4   Liver Function Tests:  Lab 07/05/12 0701 07/02/12 0716 06/30/12 1430  AST -- -- --  ALT -- -- --  ALKPHOS -- -- --  BILITOT -- -- --  PROT -- -- --  ALBUMIN 1.6* 1.7* 1.8*   No results found for this basename: LIPASE:3,AMYLASE:3 in the last 168 hours No results found for this basename: AMMONIA:3 in the last 168 hours CBC:  Lab 07/05/12 0701 07/02/12 0716 06/30/12 1430  WBC 12.9* 10.2 11.2*  NEUTROABS -- -- --  HGB 9.2* 8.9* 8.6*  HCT 30.1* 29.0* 27.2*  MCV 89.6 90.1 88.9  PLT 189 161 159   Cardiac Enzymes: No results found for this basename: CKTOTAL:5,CKMB:5,CKMBINDEX:5,TROPONINI:5 in the last 168 hours CBG: No results found for this basename: GLUCAP:5 in the last 168 hours  Iron Studies: No results found for this basename: IRON,TIBC,TRANSFERRIN,FERRITIN in the last 72 hours Studies/Results: No results found. Medications:      . aspirin EC  81 mg Oral Daily  . carvedilol  3.125 mg Oral BID WC  . cefTAZidime (FORTAZ)  IV  2 g Intravenous Q  T,Th,Sat-1800  . darbepoetin  100 mcg Intravenous Q Sat-HD  . docusate sodium  100 mg Oral QHS  . feeding supplement (NEPRO CARB STEADY)  237 mL Oral BID WC  . hydrALAZINE  10 mg Oral Q8H  . HYDROmorphone  4 mg Oral Q4H  . iron dextran (INFED/DEXFERRUM) infusion  100 mg Intravenous Q T,Th,Sa-HD  . isosorbide mononitrate  30 mg Oral Daily  . methadone  10 mg Oral TID  . micafungin [facility]) IV  100 mg Intravenous Daily  . multivitamin  1 tablet Oral QHS  . pantoprazole  40 mg Oral Daily  . ranolazine  500 mg Oral Daily  . sevelamer  400 mg Oral Q breakfast  . simvastatin  10 mg Oral q1800  . sodium chloride  3 mL Intravenous Q12H  . vancomycin  500 mg Intravenous Q T,Th,Sa-HD   I  have reviewed scheduled and prn medications.  Physical Exam:  General: Alert, Sitting upright in bed. NAD, Appropriate  Heart: RRR, 1/6 sem lsb, no rub, Left chest AICD  Lungs: CTA bilaterally  Abdomen: BS +=,  milddistention ,  Slightly tender  Extremities: Dialysis Access: 1 +,  bipedal edema / Right ij perm cath patent   Outpatient HD- [facility] On TTS, 4 hrs, Edw= 53.5 kg, heparin , EPO 1500 units q hd, no vit d last pth=178.2 (05/05/12) Bath= 3.0 k, 2.5 Ca, 1.0 mg Was on VAncomycin q hd and Fortaz 2gm qhd since 12/14./13 TFS 13% Iron load order X 10 x 06/13/12 ( needing 7 more doses )   Problem/Plan:  1. Abdominal Pain With Chronic Pancreatitis, question pancreatic abcess; s/p IR aspiration 1/14- Enterococcus and  showing yeast, cx pend, Dr. Ninetta Lights following, Awaiting Isolate Yeast Organism per ID/on micafungin/vanc/fortaz and  [facility]  Coverage of  (po voriconazole). If to continue vanco and fortaz will need to know duration for outpt - ID will need to advise 2. Ascites, s/p therapeutic paracentesis 3L 1/17 3. ESRD, ( [facility])_ tts HD yesterday Wt to 51.3 Lower edw. WANTS to change to closer kidney center+ [facility] awaiting Med. director and [facility] okay with  Their large census  Availability of new Pt. Pending after dc to [facility] .She will have to return to [facility] full and not taking new patients right now. 4. HD Access- Scheduled at [facility] med center for new R avgg Dr. Hollace Hayward 07/13/12/ need to contact  Surgeon about recent Infections process to  ? Change date 5. Anemia - hgb 9.2 /On aranesp 100,and on infed 6. Secondary hyperparathyroidism -( Corrected Ca=12.9) and phos 3.7; holding Renvela, no vit D and low Ca bath w HD/ PTH pending 7. HTN/volume - [name] has pedal edema ?? also Related to her low alb and liver dz state also. Tolerating hd uf so far, continue to challenge on hd. Have decreased to 3.125mg  for bp control on hd/ also on Imdur 30 mg and on Hydralazine  10mg  bid  8. 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 CM( 10 % EF) / AICD 10. DNR       11. Disposition = Discharge home when [facility] is able to approve Po Voriconazole  Dorothy Pastel, PA-C [facility] Beeper [phone] 07/06/2012,8:42 AM  LOS: 11 days  I have seen and examined this patient and agree with plan as outlined above with highlighted additions.  In a holding pattern waiting for voriconazole and final recommendations for antibiotics. Will also check with ID about advisability of proceeding with AVG on 1/27 vs delaying d/t panc inf Dorothy Dymek [name],[name] 07/06/2012 12:34 PM"   Called and discussed with Dr. Ninetta Lights ,. ID=" okay to have new avgg placed  Next week at [facility] med center"   [facility]

## 2012-07-07 DIAGNOSIS — A4150 Gram-negative sepsis, unspecified: Secondary | ICD-10-CM

## 2012-07-07 DIAGNOSIS — A419 Sepsis, unspecified organism: Secondary | ICD-10-CM

## 2012-07-07 LAB — CBC
Hemoglobin: 8.5 g/dL — ABNORMAL LOW (ref 12.0–15.0)
MCH: 27.7 pg (ref 26.0–34.0)
Platelets: 180 10*3/uL (ref 150–400)
RBC: 3.07 MIL/uL — ABNORMAL LOW (ref 3.87–5.11)
WBC: 11 10*3/uL — ABNORMAL HIGH (ref 4.0–10.5)

## 2012-07-07 LAB — BASIC METABOLIC PANEL
CO2: 27 mEq/L (ref 19–32)
Calcium: 10.9 mg/dL — ABNORMAL HIGH (ref 8.4–10.5)
Chloride: 95 mEq/L — ABNORMAL LOW (ref 96–112)
Glucose, Bld: 76 mg/dL (ref 70–99)
Potassium: 4.2 mEq/L (ref 3.5–5.1)
Sodium: 132 mEq/L — ABNORMAL LOW (ref 135–145)

## 2012-07-07 MED ORDER — HYDROMORPHONE HCL 2 MG PO TABS: 2.0000 mg | ORAL_TABLET | Freq: Four times a day (QID) | ORAL | Status: DC | PRN

## 2012-07-07 MED ORDER — HYDROMORPHONE HCL PF 1 MG/ML IJ SOLN
INTRAMUSCULAR | Status: AC
  Filled 2012-07-07: qty 2

## 2012-07-07 MED ORDER — HEPARIN SODIUM (PORCINE) 1000 UNIT/ML IJ SOLN
1000.0000 [IU] | Freq: Once | INTRAMUSCULAR | Status: AC
  Administered 2012-07-07: 1000 [IU] via INTRAVENOUS

## 2012-07-07 NOTE — Care Management Note (Signed)
   CARE MANAGEMENT NOTE 07/07/2012  Patient:  [name], [name]   Account Number:  [id number]  Date Initiated:  06/27/2012  Documentation initiated by:  Jackelyn Illingworth  Subjective/Objective Assessment:   CM following for progression and d/c planning.  07/04/2012 Request for assistance with medication, Voriconazole.     Action/Plan:   01/20 Met with pt who received medications from [place]. All [facility] numbers called and messages left however no response due to holiday, will place calls tomorrow re coverage for this drug.   Anticipated DC Date:  07/06/2012   Anticipated DC Plan:  HOME W HOME HEALTH SERVICES      DC Planning Services  CM consult  Medication Assistance      Choice offered to / List presented to:          [facility] arranged  HH-1 RN  HH-2 PT  HH-6 SOCIAL WORKER      HH agency  [facility]   Status of service:  Completed, signed off Medicare Important Message given?   (If response is "NO", the following Medicare IM given date fields will be blank) Date Medicare IM given:   Date Additional Medicare IM given:    Discharge Disposition:  HOME W HOME HEALTH SERVICES  Per UR Regulation:    If discussed at Long Length of Stay Meetings, dates discussed:    Comments:  07/07/2012 Ongoing efforts this week to obtain pt medication, Voriconazole from [place] , which is pt primary care provider and pharmacy. Discussions with pt social worker, Sherlynn Stalls, Hoy Register, casemanager and with Dr Miguel Aschoff who Nadeen Landau declined to approve the medication then on 07/06/2012 approved this medication. The [facility] pharmacy was able to provide 10 tablets and planned to order more, however called back on 07/06/2012 pm and stated that they wanted to review ID notes before they would order more of this medication. This CM faxed ID progress notes to social worker [name] per her request @ [phone] and 1145 on 07/07/2012. Pt will be d/c with 5 days of medication and per Ms Para March referral is in place for  ID consult by [facility] MD. Also spoke with April of [place] re plan and there is concern by the [place] and this CM that the consult may delay the pt receiving further medication, however pt no longer requires inpt acute care. Hopefully the [facility] will move forward with this plan.   CRoyal RN MPH, [phone]   07/04/2012 Met with pt and called all numbers that pt was able to provide for VA services in [place], messages left due to holiday. Will begin calling again tomorrow am 07/05/2012 to determine if VA will cover, Voriconazole. Johny Shock RN MPH Case Manager [phone]  Elliot Cousin, RN Case Manager Signed CASE MANAGEMENT Progress Notes 07/02/2012 5:54 PM NCM [facility] pt [facility]. [facility] is closed on weekends and will be closed on Monday, 07/04/2012. NCM will follow up with [facility] for medication on 1/21. Isidoro Donning RN CCM Case Mgmt phone [phone]

## 2012-07-07 NOTE — Discharge Summary (Signed)
Physician Discharge Summary  [name] ZOX:[id number] DOB: 01/28/1951 DOA: 06/25/2012  PCP: Jyl Heinz, MD  Admit date: 06/25/2012 Discharge date: 07/07/2012  Time spent: 35 minutes  Recommendations for Outpatient Follow-up:  1. Follow up with infectious disease at [facility]  Discharge Diagnoses:  Principal Problem:  *Infected pseudocyst of pancreas Active Problems:  Ischemic cardiomyopathy, EF 20-25% echo April 2013, now 10% by cath (05/06/12)  Chronic systolic congestive heart failure, NYHA class 3  LBBB (left bundle branch block)  Hypertension  Pancreatitis, chronic  Anemia in chronic kidney disease (CKD)  History of Portal vein thrombosis  Ascites  ESRD on hemodialysis  ICD (implantable cardiac defibrillator) in place, MDT placed Jan 2013 [facility]   Discharge Condition: guarded  Diet recommendation: renal diet  Filed Weights   07/05/12 2157 07/06/12 2144 07/07/12 0750  Weight: 52.6 kg (115 lb 15.4 oz) 54.386 kg (119 lb 14.4 oz) 54.5 kg (120 lb 2.4 oz)    History of present illness:  62 y.o. female who unfortunately missed her dialysis session yesterday. Patient presented to the ED saying that she wasn't feeling well and has abdominal pain. She has had similar complaints and presentations numerous times in the past. She has known chronic pancreatitis with pseudocyst. Denies any fever, no more SOB than normal, no N/V.  In the ED lab work was remarkable for elevated BUN but otherwise was unimpressive. Dr. Hyman Hopes was called and recognized the patient by name, and stated that he would be in to dialyze her tomorrow. Hospitalist has been asked to admit.   Hospital Course:  Infected pseudocyst of pancreas/Portal vein thrombosis (02/24/2012):  - complex infected pseudoocyst with pancreatic necrosis last admission 05-22-2012.  - aspirate culture from pancreatic abscess pending - enterococcus sensitive to ampicillin and vanc. 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 Ascites. Abnormal hepatic parenchymal perfusion with diffuse progressive portal venous thrombosis.  -Surgery consulted. Patient refused pancreatectomy, she relates she would not tolerate it. - pain controlled with adjusted methadone and dilaudid by palliative care. - recurrent paracentesis by IR 07/01/12 with 3 liters removed  - started on empiric abx 06/29/12 and ID managing. Will continue voriconazole as and outpatient. Continue fortaz and vanco with HD for 1 month. - voriconazole for home. Has been arrange to be delivered.  ESRD:  - Continue with Dialysis. Correction of electrolytes during dialysis. No complaint with dialysis, missed last 2 session prior to admission.   Ischemic cardiomyopathy, EF 20-25% echo April 2013, now 10% by cath (05/06/12): - Continue with Zocor, IMDUR, Coreg, aspirin.   Hypertension (12/01/2010) - stable.   Anemia in chronic kidney disease (CKD) (10/13/2011) - hbg stable   Procedures:   paracentesis by IR 07/01/12 with 3 liters removed (i.e. Studies not automatically included, echos, thoracentesis, etc; not x-rays)  Consultations:  ID  Surgery  Discharge Exam: Filed Vitals:   07/06/12 2144 07/07/12 0513 07/07/12 0750 07/07/12 0759  BP: 98/64 108/74 129/89 124/87  Pulse: 71 78 87 86  Temp: 98.2 F (36.8 C) 97.7 F (36.5 C) 97.7 F (36.5 C)   TempSrc: Oral Oral Oral   Resp: 17 17 18 20   Height:      Weight: 54.386 kg (119 lb 14.4 oz)  54.5 kg (120 lb 2.4 oz)   SpO2: 100% 98% 98% 100%    General: A&O x3 Cardiovascular: RRR Respiratory: good air movement CTA B/L  Discharge Instructions  Discharge Orders    Future  Orders Please Complete By Expires   Diet - low sodium heart healthy      Increase activity slowly          Medication List     As of 07/07/2012  8:20 AM    STOP taking these medications         Magnesium Oxide 420 MG Tabs      nitroGLYCERIN 0.4 MG SL tablet    Commonly known as: NITROSTAT      sulfamethoxazole-trimethoprim 800-160 MG per tablet   Commonly known as: BACTRIM DS,SEPTRA DS      TAKE these medications         aspirin 81 MG EC tablet   Take 1 tablet (81 mg total) by mouth daily.      carvedilol 6.25 MG tablet   Commonly known as: COREG   Take 1 tablet (6.25 mg total) by mouth 2 (two) times daily with a meal.      darbepoetin 100 MCG/0.5ML Soln   Commonly known as: ARANESP   Inject 100 mcg into the vein See admin instructions. In dialysis      dextrose 5 % SOLN 50 mL with cefTAZidime 2 G SOLR   2 grams iv every Tuesday Thursday and Saturday with dialysis      dextrose 5 % SOLN 50 mL with cefTAZidime 2 G SOLR   3 times a week      feeding supplement (NEPRO CARB STEADY) Liqd   Take 237 mLs by mouth 2 (two) times daily with breakfast and lunch.      hydrALAZINE 25 MG tablet   Commonly known as: APRESOLINE   Take 1 tablet (25 mg total) by mouth every 8 (eight) hours.      HYDROmorphone 2 MG tablet   Commonly known as: DILAUDID   Take 1 tablet (2 mg total) by mouth every 6 (six) hours as needed for pain. For pain      isosorbide mononitrate 30 MG 24 hr tablet   Commonly known as: IMDUR   Take 1 tablet (30 mg total) by mouth daily.      methadone 5 MG tablet   Commonly known as: DOLOPHINE   Take 2 tablets (10 mg total) by mouth 3 (three) times daily. Takes along with Dilaudid.      multivitamin Tabs tablet   Take 1 tablet by mouth daily.      pantoprazole 40 MG tablet   Commonly known as: PROTONIX   Take 40 mg by mouth daily.      pravastatin 20 MG tablet   Commonly known as: PRAVACHOL   Take 20 mg by mouth at bedtime.      ranolazine 500 MG 12 hr tablet   Commonly known as: RANEXA   Take 500 mg by mouth daily.      sevelamer 400 MG tablet   Commonly known as: RENAGEL   Take 400 mg by mouth daily. With a meal      sodium chloride 0.9 % SOLN 100 mL with vancomycin 500 MG SOLR   500 mg every Tuesday ,  Thursday and Saturday with dialysis      sodium chloride 0.9 % SOLN 100 mL with vancomycin 500 MG SOLR   3 tines a week      voriconazole 200 MG tablet   Commonly known as: VFEND   Take 1 tablet (200 mg total) by mouth 2 (two) times daily.           Follow-up Information  Follow up with ARMOUR, ROSS B, MD. In 2 weeks. (hospital follow up 2 weeks.)    Contact information:   [address] [place] [address] [phone]           The results of significant diagnostics from this hospitalization (including imaging, microbiology, ancillary and laboratory) are listed below for reference.    Significant Diagnostic Studies: Dg Chest 2 View  06/25/2012  *RADIOLOGY REPORT*  Clinical Data: Abdominal pain  CHEST - 2 VIEW  Comparison: 06/02/2012  Findings: There is a right chest wall dialysis catheter with tip in the right atrium.  Left chest wall AICD is noted with leads in the right atrial appendage, coronary sinus and right ventricle. Previous median sternotomy and CABG procedure.  There is marked cardiac enlargement. The small pleural effusions and mild interstitial edema noted.  IMPRESSION:  1.  Marked cardiac enlargement. 2.  Small pleural effusions and mild interstitial edema suggestive of CHF.   Original Report Authenticated By: Signa Kell, M.D.    Ct Abdomen Pelvis W Contrast  06/26/2012  *RADIOLOGY REPORT*  Clinical Data: Worsening abdominal pain.  The patient has a history of complex infected pseudocyst with pancreatic necrosis.  Last admission 05/22/2012.  History of chronic pancreatitis.  CT ABDOMEN AND PELVIS WITH CONTRAST  Technique:  Multidetector CT imaging of the abdomen and pelvis was performed following the standard protocol during bolus administration of intravenous contrast.  Contrast: 80mL OMNIPAQUE IOHEXOL 300 MG/ML  SOLN  Comparison: 05/24/2012  Findings: Small right pleural effusion.  Atelectasis or infiltration in both lung bases.  Diffuse cardiac enlargement.  Postoperative changes in the mediastinum.  There is diffuse abdominal and pelvic fluid consistent with ascites, increasing since previous study.  Diffuse subcutaneous soft tissue edema.  The liver demonstrates diffuse central hypo perfusion pattern similar to previous study.  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  Spleen size is normal.  Gallbladder is surgically absent.  Visualization of the pancreas is limited due to obscuration by the surrounding fluid.  There appear to be gas collections throughout the pancreas or peripancreatic tissues with gas and fluid collection again demonstrated at the left upper quadrant extending from the region of the pancreatic tail into the splenic hilum.  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  Gas and fluid collections appear to be increasing mildly since the previous study. No discrete drainable collection is identified.  Visualization of bowel is limited due to limited opacification.  However, there is suggestion of mild wall thickening consistent with edema in the jejunum.  Contrast material does pass through the colon suggesting no evidence of obstruction. The stomach is decompressed.  The colon is stool-filled.  The kidneys demonstrate small parenchymal cysts.  There is no hydronephrosis.  There is no contrast material shown in the renal collecting systems on the delayed images, suggesting possible delayed appearance but delayed images were obtained only 2-3 minutes after arterial phase in this may be artifactual.  Delayed appearance of contrast material in the renal collecting system cannot suggest renal hypoperfusion or medical renal disease.  Ascites extends into the pelvis.  The bladder is decompressed. Pelvic organs are not visualized.  Scattered diverticula in the sigmoid colon without definite diverticulitis.  The appendix is normal.  Normal alignment of the lumbar vertebrae without compression deformity or expansile destructive  change.  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  Ascites.  Abnormal hepatic parenchymal perfusion with diffuse portal venous thrombosis.  Changes have progressed since  previous study.  Probable edema in the jejunum which might be related to ascites.  Delayed appearance of contrast material in the renal collecting systems suggest renal parenchymal disease.   Original Report Authenticated By: Burman Nieves, M.D.    Ct Aspiration  07/01/2012  *RADIOLOGY REPORT*  CT GUIDED ASPIRATION  Date: 06/28/2012  Clinical History: 62 year old female with a history of pancreatitis and peripancreatic fluid and gas collections concerning for retroperitoneal abscess.  Procedures Performed: 1. CT guided aspiration of left retroperitoneal fluid gas collection  Interventional Radiologist:  Sterling Big, MD  Sedation: Moderate (conscious) sedation was used.  25 mg Versed, one mcg Fentanyl were administered intravenously.  The patient's vital signs were monitored continuously by radiology nursing throughout the procedure.  Sedation Time: 82 minutes  Fluoroscopy time: 3 seconds  Contrast volume: None  PROCEDURE/FINDINGS:   Informed consent was obtained from the patient following explanation of the procedure, risks, benefits and alternatives. The patient understands, agrees and consents for the procedure. All questions were addressed. A time out was performed.  Maximal barrier sterile technique utilized including caps, mask, sterile gowns, sterile gloves, large sterile drape, hand hygiene, and betadine skin prep.  A planning axial CT scan was performed.  A small fluid and gas collection in the left retroperitoneal space just anterior to the lower pole of the spleen was successfully identified.  A suitable skin entry site was selected and marked. Local anesthesia was achieved by infiltration of 1% lidocaine.  Under direct CT fluoroscopic guidance in 18 gauge trocar  needle was advanced into the collection.  A total of 15 ml of opaque white/pain and fluid was successfully aspirated.  The needle was removed.  The fluid will be sent for both bacterial and fungal culture.  The patient tolerated the procedure well, there is no immediate complication.  IMPRESSION:  Technically successful CT-guided aspiration of left retroperitoneal fluid and gas collection.  15 ml of opaque, white/tan material was successfully aspirated and sent for culture.  Signed,  Sterling Big, MD Vascular & Interventional Radiologist [facility]   Original Report Authenticated By: Malachy Moan, M.D.    US Paracentesis  07/01/2012  *RADIOLOGY REPORT*  Clinical Data: Abdominal pain secondary to pancreatitis with pseudocyst, ascites  ULTRASOUND GUIDED PARACENTESIS  Comparison:  None  An ultrasound guided paracentesis was thoroughly discussed with the patient and questions answered.  The benefits, risks, alternatives and complications were also discussed.  The patient understands and wishes to proceed with the procedure.  Written consent was obtained.  Ultrasound was performed to localize and mark an adequate pocket of fluid in the left lower quadrant of the abdomen.  The area was then prepped and draped in the normal sterile fashion.  1% Lidocaine was used for local anesthesia.  Under ultrasound guidance a 19 gauge Yueh catheter was introduced.  Paracentesis was performed.  The catheter was removed and a dressing applied.  Complications:  None  Findings:  A total of approximately 3 liters of amber colored fluid was removed.  A fluid sample was not sent for laboratory analysis.  IMPRESSION: Successful ultrasound guided paracentesis yielding 3 liters of ascites.  Read by Brayton El PA-C   Original Report Authenticated By: Malachy Moan, M.D.     Microbiology: Recent Results (from the past 240 hour(s))  CULTURE, ROUTINE-ABSCESS     Status: Normal   Collection Time   06/28/12  4:26 PM       Component Value Range Status Comment   Specimen Description ABSCESS  Final    Special Requests LEFT RETROPERITONEAL FLUID   Final    Gram Stain     Final    Value: ABUNDANT WBC PRESENT,BOTH PMN AND MONONUCLEAR     FEW YEAST   Culture     Final    Value: FEW ENTEROCOCCUS SPECIES     FEW YEAST CONSISTENT WITH CANDIDA SPECIES   Report Status 07/05/2012 FINAL   Final    Organism ID, Bacteria ENTEROCOCCUS SPECIES   Final   ANAEROBIC CULTURE     Status: Normal   Collection Time   06/28/12  4:26 PM      Component Value Range Status Comment   Specimen Description ABSCESS   Final    Special Requests LEFT RETROPERITONEAL FLUID   Final    Gram Stain     Final    Value: ABUNDANT WBC PRESENT,BOTH PMN AND MONONUCLEAR     FEW YEAST   Culture NO ANAEROBES ISOLATED   Final    Report Status 07/03/2012 FINAL   Final   FUNGUS CULTURE W SMEAR     Status: Normal (Preliminary result)   Collection Time   06/28/12  4:26 PM      Component Value Range Status Comment   Specimen Description ABSCESS   Final    Special Requests LEFT RETROPERITONEAL FLUID   Final    Fungal Smear NO YEAST OR FUNGAL ELEMENTS SEEN   Final    Culture YEAST ISOLATED;ID TO FOLLOW   Final    Report Status PENDING   Incomplete      Labs: Basic Metabolic Panel:  Lab 07/05/12 1308 07/02/12 0716 06/30/12 1430  NA 128* 128* 130*  K 4.7 3.6 3.5  CL 92* 91* 93*  CO2 26 28 27   GLUCOSE 68* 87 138*  BUN 42* 29* 32*  CREATININE 3.77* 3.18* 3.51*  CALCIUM 11.0* 10.6* 10.9*  MG -- -- --  PHOS 4.1 3.7 4.4   Liver Function Tests:  Lab 07/05/12 0701 07/02/12 0716 06/30/12 1430  AST -- -- --  ALT -- -- --  ALKPHOS -- -- --  BILITOT -- -- --  PROT -- -- --  ALBUMIN 1.6* 1.7* 1.8*   No results found for this basename: LIPASE:5,AMYLASE:5 in the last 168 hours No results found for this basename: AMMONIA:5 in the last 168 hours CBC:  Lab 07/05/12 0701 07/02/12 0716 06/30/12 1430  WBC 12.9* 10.2 11.2*  NEUTROABS -- -- --    HGB 9.2* 8.9* 8.6*  HCT 30.1* 29.0* 27.2*  MCV 89.6 90.1 88.9  PLT 189 161 159   Cardiac Enzymes: No results found for this basename: CKTOTAL:5,CKMB:5,CKMBINDEX:5,TROPONINI:5 in the last 168 hours BNP: BNP (last 3 results)  Basename 06/02/12 1944 05/08/12 0515 04/21/12 1419  PROBNP >70000.0* >70000.0* >70000.0*   CBG: No results found for this basename: GLUCAP:5 in the last 168 hours  Signed:  Marinda Elk  [facility] 07/07/2012, 8:20 AM

## 2012-07-07 NOTE — Progress Notes (Signed)
07/07/2012 Physical Therapy Cancellation Note: pt currently in HD and unable to participate in therapy. Will attempt in pm as time allows. Thanks Delaney Meigs, PT [phone]

## 2012-07-07 NOTE — Procedures (Signed)
I have personally attended this patient's dialysis session.   Permcath BFR 400 Tight heparin BP stable 2K bath pending labs Appears will be d/c after HD today as voriconazole to arrive at her home today - she says gets a 20 day supply Per ID will need a month of vanco and fortaz total so will arrange at the outpt unit ID also says OK to go ahead with right arm graft on 1/27 as originally planned  [name]

## 2012-07-12 ENCOUNTER — Inpatient Hospital Stay (HOSPITAL_COMMUNITY)
Admission: EM | Admit: 2012-07-12 | Discharge: 2012-07-20 | DRG: 640 | Disposition: A | Discharge status: Skilled Nursing Facility | Payer: Non-veteran care | Attending: Family Medicine | Admitting: Family Medicine

## 2012-07-12 ENCOUNTER — Emergency Department (HOSPITAL_COMMUNITY): Payer: Non-veteran care

## 2012-07-12 ENCOUNTER — Encounter (HOSPITAL_COMMUNITY): Payer: Self-pay | Admitting: Physical Medicine and Rehabilitation

## 2012-07-12 DIAGNOSIS — I447 Left bundle-branch block, unspecified: Secondary | ICD-10-CM | POA: Diagnosis present

## 2012-07-12 DIAGNOSIS — E213 Hyperparathyroidism, unspecified: Secondary | ICD-10-CM | POA: Diagnosis present

## 2012-07-12 DIAGNOSIS — R627 Adult failure to thrive: Secondary | ICD-10-CM | POA: Diagnosis present

## 2012-07-12 DIAGNOSIS — D696 Thrombocytopenia, unspecified: Secondary | ICD-10-CM

## 2012-07-12 DIAGNOSIS — T829XXA Unspecified complication of cardiac and vascular prosthetic device, implant and graft, initial encounter: Secondary | ICD-10-CM | POA: Diagnosis present

## 2012-07-12 DIAGNOSIS — R0600 Dyspnea, unspecified: Secondary | ICD-10-CM

## 2012-07-12 DIAGNOSIS — I2589 Other forms of chronic ischemic heart disease: Secondary | ICD-10-CM

## 2012-07-12 DIAGNOSIS — Z9119 Patient's noncompliance with other medical treatment and regimen: Secondary | ICD-10-CM

## 2012-07-12 DIAGNOSIS — I8510 Secondary esophageal varices without bleeding: Secondary | ICD-10-CM

## 2012-07-12 DIAGNOSIS — Z992 Dependence on renal dialysis: Secondary | ICD-10-CM

## 2012-07-12 DIAGNOSIS — I495 Sick sinus syndrome: Secondary | ICD-10-CM

## 2012-07-12 DIAGNOSIS — K631 Perforation of intestine (nontraumatic): Secondary | ICD-10-CM

## 2012-07-12 DIAGNOSIS — K862 Cyst of pancreas: Secondary | ICD-10-CM | POA: Diagnosis present

## 2012-07-12 DIAGNOSIS — I2510 Atherosclerotic heart disease of native coronary artery without angina pectoris: Secondary | ICD-10-CM

## 2012-07-12 DIAGNOSIS — K5900 Constipation, unspecified: Secondary | ICD-10-CM | POA: Diagnosis present

## 2012-07-12 DIAGNOSIS — D631 Anemia in chronic kidney disease: Secondary | ICD-10-CM

## 2012-07-12 DIAGNOSIS — E119 Type 2 diabetes mellitus without complications: Secondary | ICD-10-CM | POA: Diagnosis present

## 2012-07-12 DIAGNOSIS — R0609 Other forms of dyspnea: Secondary | ICD-10-CM | POA: Diagnosis present

## 2012-07-12 DIAGNOSIS — Z91199 Patient's noncompliance with other medical treatment and regimen due to unspecified reason: Secondary | ICD-10-CM

## 2012-07-12 DIAGNOSIS — R52 Pain, unspecified: Secondary | ICD-10-CM

## 2012-07-12 DIAGNOSIS — I252 Old myocardial infarction: Secondary | ICD-10-CM

## 2012-07-12 DIAGNOSIS — K7460 Unspecified cirrhosis of liver: Secondary | ICD-10-CM | POA: Diagnosis present

## 2012-07-12 DIAGNOSIS — Z79899 Other long term (current) drug therapy: Secondary | ICD-10-CM

## 2012-07-12 DIAGNOSIS — I214 Non-ST elevation (NSTEMI) myocardial infarction: Secondary | ICD-10-CM

## 2012-07-12 DIAGNOSIS — I5022 Chronic systolic (congestive) heart failure: Secondary | ICD-10-CM | POA: Diagnosis present

## 2012-07-12 DIAGNOSIS — I81 Portal vein thrombosis: Secondary | ICD-10-CM

## 2012-07-12 DIAGNOSIS — I120 Hypertensive chronic kidney disease with stage 5 chronic kidney disease or end stage renal disease: Secondary | ICD-10-CM | POA: Diagnosis present

## 2012-07-12 DIAGNOSIS — K861 Other chronic pancreatitis: Secondary | ICD-10-CM | POA: Diagnosis present

## 2012-07-12 DIAGNOSIS — R64 Cachexia: Secondary | ICD-10-CM | POA: Diagnosis present

## 2012-07-12 DIAGNOSIS — K863 Pseudocyst of pancreas: Secondary | ICD-10-CM

## 2012-07-12 DIAGNOSIS — Z951 Presence of aortocoronary bypass graft: Secondary | ICD-10-CM

## 2012-07-12 DIAGNOSIS — E875 Hyperkalemia: Principal | ICD-10-CM | POA: Diagnosis present

## 2012-07-12 DIAGNOSIS — R109 Unspecified abdominal pain: Secondary | ICD-10-CM | POA: Diagnosis present

## 2012-07-12 DIAGNOSIS — Z95810 Presence of automatic (implantable) cardiac defibrillator: Secondary | ICD-10-CM

## 2012-07-12 DIAGNOSIS — N186 End stage renal disease: Secondary | ICD-10-CM

## 2012-07-12 DIAGNOSIS — G8929 Other chronic pain: Secondary | ICD-10-CM | POA: Diagnosis present

## 2012-07-12 DIAGNOSIS — Z66 Do not resuscitate: Secondary | ICD-10-CM | POA: Diagnosis not present

## 2012-07-12 DIAGNOSIS — R188 Other ascites: Secondary | ICD-10-CM

## 2012-07-12 DIAGNOSIS — Z87891 Personal history of nicotine dependence: Secondary | ICD-10-CM

## 2012-07-12 DIAGNOSIS — I255 Ischemic cardiomyopathy: Secondary | ICD-10-CM | POA: Diagnosis present

## 2012-07-12 DIAGNOSIS — K659 Peritonitis, unspecified: Secondary | ICD-10-CM | POA: Diagnosis present

## 2012-07-12 DIAGNOSIS — N390 Urinary tract infection, site not specified: Secondary | ICD-10-CM | POA: Diagnosis present

## 2012-07-12 DIAGNOSIS — Z7982 Long term (current) use of aspirin: Secondary | ICD-10-CM

## 2012-07-12 DIAGNOSIS — B49 Unspecified mycosis: Secondary | ICD-10-CM

## 2012-07-12 DIAGNOSIS — I10 Essential (primary) hypertension: Secondary | ICD-10-CM

## 2012-07-12 DIAGNOSIS — R0989 Other specified symptoms and signs involving the circulatory and respiratory systems: Secondary | ICD-10-CM

## 2012-07-12 HISTORY — DX: Other chronic pancreatitis: K86.1

## 2012-07-12 HISTORY — DX: Dependence on renal dialysis: Z99.2

## 2012-07-12 HISTORY — DX: Unspecified complication of cardiac and vascular prosthetic device, implant and graft, initial encounter: T82.9XXA

## 2012-07-12 HISTORY — DX: End stage renal disease: N18.6

## 2012-07-12 LAB — CBC
Hemoglobin: 9.3 g/dL — ABNORMAL LOW (ref 12.0–15.0)
MCV: 88.6 fL (ref 78.0–100.0)
Platelets: 318 10*3/uL (ref 150–400)
RBC: 3.34 MIL/uL — ABNORMAL LOW (ref 3.87–5.11)
WBC: 18.7 10*3/uL — ABNORMAL HIGH (ref 4.0–10.5)

## 2012-07-12 LAB — URINALYSIS, ROUTINE W REFLEX MICROSCOPIC
Glucose, UA: 100 mg/dL — AB
Protein, ur: >300 mg/dL — AB
Specific Gravity, Urine: >1.03 — ABNORMAL HIGH (ref 1.005–1.030)

## 2012-07-12 LAB — POCT I-STAT, CHEM 8
BUN: 103 mg/dL — ABNORMAL HIGH (ref 6–23)
Creatinine, Ser: 5.6 mg/dL — ABNORMAL HIGH (ref 0.50–1.10)
Potassium: 6.3 mEq/L (ref 3.5–5.1)
Sodium: 133 mEq/L — ABNORMAL LOW (ref 135–145)

## 2012-07-12 LAB — URINE MICROSCOPIC-ADD ON

## 2012-07-12 LAB — POCT I-STAT TROPONIN I: Troponin i, poc: 0.07 ng/mL (ref 0.00–0.08)

## 2012-07-12 LAB — GLUCOSE, CAPILLARY
Glucose-Capillary: 63 mg/dL — ABNORMAL LOW (ref 70–99)
Glucose-Capillary: 112 mg/dL — ABNORMAL HIGH (ref 70–99)

## 2012-07-12 LAB — LIPASE, BLOOD: Lipase: 29 U/L (ref 11–59)

## 2012-07-12 MED ORDER — DARBEPOETIN ALFA-POLYSORBATE 25 MCG/0.42ML IJ SOLN
25.0000 ug | INTRAMUSCULAR | Status: DC
  Administered 2012-07-12: 25 ug via INTRAVENOUS
  Filled 2012-07-12: qty 0.42

## 2012-07-12 MED ORDER — HYDROMORPHONE HCL PF 1 MG/ML IJ SOLN
1.0000 mg | INTRAMUSCULAR | Status: DC | PRN
  Administered 2012-07-12 – 2012-07-13 (×5): 1 mg via INTRAVENOUS
  Filled 2012-07-12 (×6): qty 1

## 2012-07-12 MED ORDER — SODIUM CHLORIDE 0.9 % IV SOLN
INTRAVENOUS | Status: AC
  Filled 2012-07-12: qty 100

## 2012-07-12 MED ORDER — DEXTROSE 5 % IV SOLN
1.0000 g | Freq: Once | INTRAVENOUS | Status: AC
  Administered 2012-07-12: 1 g via INTRAVENOUS
  Filled 2012-07-12: qty 10

## 2012-07-12 MED ORDER — INSULIN ASPART 100 UNIT/ML ~~LOC~~ SOLN
10.0000 [IU] | Freq: Once | SUBCUTANEOUS | Status: AC
  Administered 2012-07-12: 10 [IU] via INTRAVENOUS
  Filled 2012-07-12: qty 1

## 2012-07-12 MED ORDER — IOHEXOL 300 MG/ML  SOLN: 20.0000 mL | INTRAMUSCULAR | Status: AC

## 2012-07-12 MED ORDER — DEXTROSE 50 % IV SOLN
1.0000 | Freq: Once | INTRAVENOUS | Status: AC
  Administered 2012-07-12: 50 mL via INTRAVENOUS
  Filled 2012-07-12: qty 50

## 2012-07-12 MED ORDER — SODIUM CHLORIDE 0.9 % IV SOLN
1.0000 g | Freq: Once | INTRAVENOUS | Status: AC
  Administered 2012-07-12: 1 g via INTRAVENOUS
  Filled 2012-07-12: qty 10

## 2012-07-12 MED ORDER — HYDROMORPHONE HCL PF 1 MG/ML IJ SOLN
1.0000 mg | Freq: Once | INTRAMUSCULAR | Status: AC
  Administered 2012-07-12: 1 mg via INTRAVENOUS
  Filled 2012-07-12: qty 1

## 2012-07-12 MED ORDER — CALCIUM GLUCONATE 10 % IV SOLN
INTRAVENOUS | Status: AC
  Filled 2012-07-12: qty 10

## 2012-07-12 MED ORDER — HYDROMORPHONE HCL PF 1 MG/ML IJ SOLN
0.5000 mg | Freq: Once | INTRAMUSCULAR | Status: AC
  Administered 2012-07-12: 0.5 mg via INTRAVENOUS
  Filled 2012-07-12: qty 1

## 2012-07-12 NOTE — Progress Notes (Signed)
Hypoglycemic Event  CBG: 63  Treatment: 15 GM carbohydrate snack  Symptoms: None  Follow-up CBG: Time: 2005 CBG Result: 95  Possible Reasons for Event: Inadequate meal intake    [name]  Remember to initiate Hypoglycemia Order Set & complete

## 2012-07-12 NOTE — ED Notes (Signed)
Pt states she does not want to drink oral contrast for CT. EDP at bedside, pt to have CT scan without contrast. Pt remains on cardiac monitor. States lower abdominal pain and heartburn at the time. She is alert and oriented x4. Will continue to monitor.

## 2012-07-12 NOTE — ED Notes (Signed)
Pt transported to CT scan.

## 2012-07-12 NOTE — ED Provider Notes (Addendum)
History     CSN: [id number]  Arrival date & time 07/12/12  1007   First MD Initiated Contact with Patient 07/12/12 1136      Chief Complaint  Patient presents with  . Shortness of Breath    (Consider location/radiation/quality/duration/timing/severity/associated sxs/prior treatment) HPI Complains of shortness of breath onset this morning approximately 3 days ago gradual onset complains of diffuse abdominal pain for the past 4 days. Admits to diminished appetite for the past 4 days. Last bowel movement 3 days ago, normal. No fever no other complaint Past Medical History  Diagnosis Date  . Gallstone pancreatitis     Had severe gallstone pancreatitis in May 2003, no surgery, treated medically. Returned in June 2003 for cholecystectomy and cyst gastrostomy for pancreatic pseudocyst.  In March was admitted for hemorrhage into psueodcyst. In April 2013 was treated at [facility] for polymicrobial sepsis (fungal, MRSA and enterobacter) felt to be due to ruptured pseudocyst, treated medically.  . Coronary artery disease     s/p CABG 2008 with multiple PCIs  . Ischemic cardiomyopathy     Cath 04/2012, significant calcifications  . History of nonadherence to medical treatment   . Systolic CHF     78/2956 echo EF 10%  . Esophageal varices   . Cirrhosis     hx of fatty liver per patient, has known ascites with repeated paracenteses at [facility] as of 2013, also +hx of esoph varices with banding in the past  . Polymicrobial sepsis     April 2013 presented septic with blood cx's + for candida glabrata, MRSA and enterbacter cloacae, felt to be due to ruptured pancreatic pseudocyst  . Pacemaker   . ICD (implantable cardiac defibrillator) in place   . Hypertension     "used to have HTN; now I'm low" (05/23/2012)  . NSTEMI (non-ST elevated myocardial infarction)     04/2012; Trop peaked to 1.39; 05/23/2012 pt denies ever having MI  . Pneumonia 02/2012    "first time ever" (05/23/2012)  . Chronic bronchitis  1970's thru 2002    "went away when I stopped smoking in 2002" (05/23/2012)  . Diabetes mellitus     Diet controlled  . History of blood transfusion     "alot of them" (05/23/2012)  . End stage renal disease     T,Th,Sat HD by [facility] (05/23/2012); h/o peritoneal dialysis prior; uses right chest cath for HD, left arm graft placed by [facility] but not ready for use  . Iron (Fe) deficiency anemia     "severe" (05/23/2012)   History of present illness (continued) nothing makes symptoms better or worse no treatment prior to coming here last dialysis was 3 days ago Past Surgical History  Procedure Date  . Esophagogastroduodenoscopy 09/15/2011    Procedure: ESOPHAGOGASTRODUODENOSCOPY (EGD);  Surgeon: Theda Belfast, MD;  Location: [facility] ENDOSCOPY;  Service: Endoscopy;  Laterality: N/A;  . Cholecystectomy 2003  . Tonsillectomy and adenoidectomy 1961  . Appendectomy 1973?  [name] Vaginal hysterectomy 1973?  [name] Tubal ligation 1972  . Dilation and curettage of uterus     "bunch from profuse bleeding in the 1970's" (05/23/2012)  . Coronary artery bypass graft 2008    CABG X4  . Coronary angioplasty with stent placement 2008    "1; day after CABG" (05/23/2012)  . Cardiac catheterization     "before 2008 and 3 wk ago" (05/23/2012)  . Insert / replace / remove pacemaker 06/2011    pacemaker ICD  . Cardiac defibrillator  placement 06/2011  . Arteriovenous graft placement 03/2012    right antecub  . Insertion of dialysis catheter ~ 10/2011    right chest  . Peritoneal catheter insertion 08/2011  . Peritoneal catheter removal ~ 10/2011    Family History  Problem Relation Age of Onset  . Coronary artery disease Mother   . Hypertension Mother   . Diabetes Mother   . Diabetes Sister   . Anesthesia problems Neg Hx     History  Substance Use Topics  . Smoking status: Former Smoker -- 0.3 packs/day for 31 years    Types: Cigarettes    Quit date: 06/15/2000  . Smokeless tobacco: Never  Used  . Alcohol Use: Yes     Comment: 05/23/2012 "drank from age [age]; never had problem w/it"    OB History    Grav Para Term Preterm Abortions TAB SAB Ect Mult Living                  Review of Systems  Constitutional: Negative.   HENT: Negative.   Respiratory: Positive for shortness of breath.   Cardiovascular: Negative.   Gastrointestinal: Positive for abdominal pain.  Musculoskeletal: Negative.   Skin: Negative.   Neurological: Negative.   Hematological: Negative.   Psychiatric/Behavioral: Negative.   All other systems reviewed and are negative.    Allergies  Codeine; Morphine and related; Ace inhibitors; and Tylenol  Home Medications   Current Outpatient Rx  Name  Route  Sig  Dispense  Refill  . ASPIRIN 81 MG PO TBEC   Oral   Take 1 tablet (81 mg total) by mouth daily.   30 tablet   0   . CARVEDILOL 6.25 MG PO TABS   Oral   Take 1 tablet (6.25 mg total) by mouth 2 (two) times daily with a meal.   60 tablet   3   . HYDRALAZINE HCL 25 MG PO TABS   Oral   Take 1 tablet (25 mg total) by mouth every 8 (eight) hours.   90 tablet   3   . HYDROMORPHONE HCL 2 MG PO TABS   Oral   Take 1 tablet (2 mg total) by mouth every 6 (six) hours as needed for pain. For pain   30 tablet   0   . ISOSORBIDE MONONITRATE ER 30 MG PO TB24   Oral   Take 1 tablet (30 mg total) by mouth daily.   30 tablet   3   . METHADONE HCL 5 MG PO TABS   Oral   Take 2 tablets (10 mg total) by mouth 3 (three) times daily. Takes along with Dilaudid.   180 tablet   0   . RENA-VITE PO TABS   Oral   Take 1 tablet by mouth daily.         [name] NEPRO/CARB STEADY PO LIQD   Oral   Take 237 mLs by mouth 2 (two) times daily with breakfast and lunch.         [name] PANTOPRAZOLE SODIUM 40 MG PO TBEC   Oral   Take 40 mg by mouth daily.         [name] PRAVASTATIN SODIUM 20 MG PO TABS   Oral   Take 20 mg by mouth at bedtime.          [name] RANOLAZINE ER 500 MG PO TB12   Oral   Take 500 mg by  mouth daily.          [name]  SEVELAMER HCL 400 MG PO TABS   Oral   Take 400 mg by mouth daily. With a meal         . VORICONAZOLE 200 MG PO TABS   Oral   Take 1 tablet (200 mg total) by mouth 2 (two) times daily.   60 tablet   0     BP 126/88  Pulse 95  Temp 97.8 F (36.6 C) (Oral)  Resp 22  SpO2 97%  Physical Exam  Nursing note and vitals reviewed. Constitutional: She appears well-developed and well-nourished.  HENT:  Head: Normocephalic and atraumatic.  Eyes: Conjunctivae normal are normal. Pupils are equal, round, and reactive to light.  Neck: Neck supple. JVD present. No tracheal deviation present. No thyromegaly present.  Cardiovascular: Normal rate and regular rhythm.   No murmur heard. Pulmonary/Chest: Effort normal and breath sounds normal.       The bases bilaterally dialysis catheter is right subclavian area  Abdominal: Soft. Bowel sounds are normal. She exhibits no distension. There is no tenderness.  Musculoskeletal: Normal range of motion. She exhibits no edema and no tenderness.  Neurological: She is alert. Coordination normal.  Skin: Skin is warm and dry. No rash noted.  Psychiatric: She has a normal mood and affect.    ED Course  Procedures (including critical care time)   Labs Reviewed  CBC  URINALYSIS, ROUTINE W REFLEX MICROSCOPIC   No results found.  Date: 07/12/2012  Rate: 90  Rhythm: normal sinus rhythm  QRS Axis: normal  Intervals: normal  ST/T Wave abnormalities: nonspecific ST/T changes  Conduction Disutrbances:left bundle branch block  Narrative Interpretation:   Old EKG Reviewed: no significant change from 06/25/12 interpreted by me   No diagnosis found.  Nursing unable to obtain IV access Angiocath insertion Performed by: Doug Sou  Consent: Verbal consent obtained. Risks and benefits: risks, benefits and alternatives were discussed Time out: Immediately prior to procedure a "time out" was called to verify the correct  patient, procedure, equipment, support staff and site/side marked as required.  Preparation: Patient was prepped and draped in the usual sterile fashion.  Vein Location: left external jugular   Gauge: 20  Normal blood return and flush without difficulty Patient tolerance: Patient tolerated the procedure well with no immediate complications.  Chest x-ray reviewed by me Results for orders placed during the hospital encounter of 07/12/12  CBC      Component Value Range   WBC 18.7 (*) 4.0 - 10.5 K/uL   RBC 3.34 (*) 3.87 - 5.11 MIL/uL   Hemoglobin 9.3 (*) 12.0 - 15.0 g/dL   HCT 16.1 (*) 09.6 - 04.5 %   MCV 88.6  78.0 - 100.0 fL   MCH 27.8  26.0 - 34.0 pg   MCHC 31.4  30.0 - 36.0 g/dL   RDW 40.9 (*) 81.1 - 91.4 %   Platelets 318  150 - 400 K/uL  URINALYSIS, ROUTINE W REFLEX MICROSCOPIC      Component Value Range   Color, Urine YELLOW  YELLOW   APPearance HAZY (*) CLEAR   Specific Gravity, Urine >1.030 (*) 1.005 - 1.030   pH 6.0  5.0 - 8.0   Glucose, UA 100 (*) NEGATIVE mg/dL   Hgb urine dipstick MODERATE (*) NEGATIVE   Bilirubin Urine SMALL (*) NEGATIVE   Ketones, ur NEGATIVE  NEGATIVE mg/dL   Protein, ur >782 (*) NEGATIVE mg/dL   Urobilinogen, UA 0.2  0.0 - 1.0 mg/dL   Nitrite POSITIVE (*) NEGATIVE   Leukocytes,  UA TRACE (*) NEGATIVE  POCT I-STAT, CHEM 8      Component Value Range   Sodium 133 (*) 135 - 145 mEq/L   Potassium 6.3 (*) 3.5 - 5.1 mEq/L   Chloride 103  96 - 112 mEq/L   BUN 103 (*) 6 - 23 mg/dL   Creatinine, Ser 6.96 (*) 0.50 - 1.10 mg/dL   Glucose, Bld 86  70 - 99 mg/dL   Calcium, Ion 2.95  2.84 - 1.30 mmol/L   TCO2 21  0 - 100 mmol/L   Hemoglobin 10.2 (*) 12.0 - 15.0 g/dL   HCT 13.2 (*) 44.0 - 10.2 %   Comment NOTIFIED PHYSICIAN    POCT I-STAT TROPONIN I      Component Value Range   Troponin i, poc 0.07  0.00 - 0.08 ng/mL   Comment 3           LIPASE, BLOOD      Component Value Range   Lipase 29  11 - 59 U/L  URINE MICROSCOPIC-ADD ON      Component  Value Range   Squamous Epithelial / LPF FEW (*) RARE   WBC, UA 11-20  <3 WBC/hpf   RBC / HPF 3-6  <3 RBC/hpf   Bacteria, UA FEW (*) RARE   Urine-Other MICROSCOPIC EXAM PERFORMED ON UNCONCENTRATED URINE     Ct Abdomen Pelvis Wo Contrast  07/12/2012  *RADIOLOGY REPORT*  Clinical Data: Shortness of breath, intermittent abdominal pain and nausea.  Recent history of necrotizing pancreatitis.  CT ABDOMEN AND PELVIS WITHOUT CONTRAST  Technique:  Multidetector CT imaging of the abdomen and pelvis was performed following the standard protocol without intravenous contrast.  Comparison: None.  Findings: The patient declined oral contrast.  Streak artifact from defibrillator leads noted.  Marked enlargement of the heart again noted.  Trace right pleural effusion with associated compressive atelectasis noted.  This is stable compared to the prior exam. Left basilar dependent atelectasis again noted.  There has been slight decrease in abdominal ascites since previously. Cholecystectomy clips noted.  The pancreas is ill- defined with a more focal area of hypodensity in the region of the tail which could represent an abscess or other fluid collection, for example image 26.  Scattered foci of peritoneal gas are again noted.  Subjectively, this is minimally decreased since previously. Extensive vascular calcification is noted.  The lack of contrast, vascular patency cannot be assessed to determine interval change in previously seen portal vein thrombosis.  There is diffuse mild prominence of the ascending colon and proximal transverse colon, for example image 40, but this is difficult to separate from surrounding fluid.  No pneumatosis is identified. This finding appears new since prior exam.  Large pelvic ascites is noted.  Uterus and ovaries not visualized. Bladder is decompressed.  It is not well visualized separate from large pelvic ascites, but is visualized on coronal and sagittal re- formations, for example coronal  image 35 and sagittal image 78. Large amount of stool noted within the rectum.  Anasarca/third spacing noted.  Due to lack of IV contrast, this significantly decreases conspicuity of detail within the solid and hollow viscera.  IMPRESSION: Interval development of ascending colonic and proximal transverse colon wall thickening, which differential considerations include ischemia, infectious/inflammatory colitis, or less likely edema related to anasarca/third spacing.  Slight decrease in ascites.  Slight decrease in peripancreatic gas.  Persistent hypodensity in the tail of the pancreas which could represent a pseudocyst or peripancreatic abscess.  Stable right pleural  effusion and bibasilar atelectasis.  Suboptimal visualization due to lack of oral and IV contrast.   Original Report Authenticated By: Christiana Pellant, M.D.    Dg Chest 2 View  06/25/2012  *RADIOLOGY REPORT*  Clinical Data: Abdominal pain  CHEST - 2 VIEW  Comparison: 06/02/2012  Findings: There is a right chest wall dialysis catheter with tip in the right atrium.  Left chest wall AICD is noted with leads in the right atrial appendage, coronary sinus and right ventricle. Previous median sternotomy and CABG procedure.  There is marked cardiac enlargement. The small pleural effusions and mild interstitial edema noted.  IMPRESSION:  1.  Marked cardiac enlargement. 2.  Small pleural effusions and mild interstitial edema suggestive of CHF.   Original Report Authenticated By: Signa Kell, M.D.    Ct Abdomen Pelvis W Contrast  06/26/2012  *RADIOLOGY REPORT*  Clinical Data: Worsening abdominal pain.  The patient has a history of complex infected pseudocyst with pancreatic necrosis.  Last admission 05/22/2012.  History of chronic pancreatitis.  CT ABDOMEN AND PELVIS WITH CONTRAST  Technique:  Multidetector CT imaging of the abdomen and pelvis was performed following the standard protocol during bolus administration of intravenous contrast.  Contrast: 80mL  OMNIPAQUE IOHEXOL 300 MG/ML  SOLN  Comparison: 05/24/2012  Findings: Small right pleural effusion.  Atelectasis or infiltration in both lung bases.  Diffuse cardiac enlargement. Postoperative changes in the mediastinum.  There is diffuse abdominal and pelvic fluid consistent with ascites, increasing since previous study.  Diffuse subcutaneous soft tissue edema.  The liver demonstrates diffuse central hypo perfusion pattern similar to previous study.  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  Spleen size is normal.  Gallbladder is surgically absent.  Visualization of the pancreas is limited due to obscuration by the surrounding fluid.  There appear to be gas collections throughout the pancreas or peripancreatic tissues with gas and fluid collection again demonstrated at the left upper quadrant extending from the region of the pancreatic tail into the splenic hilum.  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  Gas and fluid collections appear to be increasing mildly since the previous study. No discrete drainable collection is identified.  Visualization of bowel is limited due to limited opacification.  However, there is suggestion of mild wall thickening consistent with edema in the jejunum.  Contrast material does pass through the colon suggesting no evidence of obstruction. The stomach is decompressed.  The colon is stool-filled.  The kidneys demonstrate small parenchymal cysts.  There is no hydronephrosis.  There is no contrast material shown in the renal collecting systems on the delayed images, suggesting possible delayed appearance but delayed images were obtained only 2-3 minutes after arterial phase in this may be artifactual.  Delayed appearance of contrast material in the renal collecting system cannot suggest renal hypoperfusion or medical renal disease.  Ascites extends into the pelvis.  The bladder is decompressed. Pelvic organs are not visualized.  Scattered diverticula  in the sigmoid colon without definite diverticulitis.  The appendix is normal.  Normal alignment of the lumbar vertebrae without compression deformity or expansile destructive change.  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  Ascites.  Abnormal hepatic parenchymal perfusion with diffuse portal venous thrombosis.  Changes have progressed since previous study.  Probable edema in the jejunum which might be related to ascites.  Delayed appearance of contrast material in the renal collecting systems suggest renal parenchymal disease.   Original Report Authenticated By: Burman Nieves, M.D.  Ct Aspiration  07/01/2012  *RADIOLOGY REPORT*  CT GUIDED ASPIRATION  Date: 06/28/2012  Clinical History: 62 year old female with a history of pancreatitis and peripancreatic fluid and gas collections concerning for retroperitoneal abscess.  Procedures Performed: 1. CT guided aspiration of left retroperitoneal fluid gas collection  Interventional Radiologist:  Sterling Big, MD  Sedation: Moderate (conscious) sedation was used.  25 mg Versed, one mcg Fentanyl were administered intravenously.  The patient's vital signs were monitored continuously by radiology nursing throughout the procedure.  Sedation Time: 82 minutes  Fluoroscopy time: 3 seconds  Contrast volume: None  PROCEDURE/FINDINGS:   Informed consent was obtained from the patient following explanation of the procedure, risks, benefits and alternatives. The patient understands, agrees and consents for the procedure. All questions were addressed. A time out was performed.  Maximal barrier sterile technique utilized including caps, mask, sterile gowns, sterile gloves, large sterile drape, hand hygiene, and betadine skin prep.  A planning axial CT scan was performed.  A small fluid and gas collection in the left retroperitoneal space just anterior to the lower pole of the spleen was successfully identified.  A  suitable skin entry site was selected and marked. Local anesthesia was achieved by infiltration of 1% lidocaine.  Under direct CT fluoroscopic guidance in 18 gauge trocar needle was advanced into the collection.  A total of 15 ml of opaque white/pain and fluid was successfully aspirated.  The needle was removed.  The fluid will be sent for both bacterial and fungal culture.  The patient tolerated the procedure well, there is no immediate complication.  IMPRESSION:  Technically successful CT-guided aspiration of left retroperitoneal fluid and gas collection.  15 ml of opaque, white/tan material was successfully aspirated and sent for culture.  Signed,  Sterling Big, MD Vascular & Interventional Radiologist [facility] Radiology   Original Report Authenticated By: Malachy Moan, M.D.    US Paracentesis  07/01/2012  *RADIOLOGY REPORT*  Clinical Data: Abdominal pain secondary to pancreatitis with pseudocyst, ascites  ULTRASOUND GUIDED PARACENTESIS  Comparison:  None  An ultrasound guided paracentesis was thoroughly discussed with the patient and questions answered.  The benefits, risks, alternatives and complications were also discussed.  The patient understands and wishes to proceed with the procedure.  Written consent was obtained.  Ultrasound was performed to localize and mark an adequate pocket of fluid in the left lower quadrant of the abdomen.  The area was then prepped and draped in the normal sterile fashion.  1% Lidocaine was used for local anesthesia.  Under ultrasound guidance a 19 gauge Yueh catheter was introduced.  Paracentesis was performed.  The catheter was removed and a dressing applied.  Complications:  None  Findings:  A total of approximately 3 liters of amber colored fluid was removed.  A fluid sample was not sent for laboratory analysis.  IMPRESSION: Successful ultrasound guided paracentesis yielding 3 liters of ascites.  Read by Brayton El PA-C   Original Report Authenticated By:  Malachy Moan, M.D.    Dg Chest Port 1 View  07/12/2012  *RADIOLOGY REPORT*  Clinical Data: Short of breath  PORTABLE CHEST - 1 VIEW  Comparison: 06/25/2012  Findings: Prominent cardiomegaly is stable.  Left subclavian AICD device and leads are stable.  Right internal jugular dialysis catheter stable.  Stable vascular congestion.  No pulmonary edema. Tiny pleural effusions stable.  IMPRESSION: Improved edema.  Mild vascular congestion persist.   Original Report Authenticated By: Jolaine Click, M.D.     3:35 PM abdominal pain  not improved. Additional hydromorphone ordered MDM  Spoke with Dr.sCHERTZ will arrange for hemodialysis today Also spoke with Dr.Elmahi will arrange for admission to telemetry Clinically patient is in congestive heart failure with Rales, peripheral edema, and dyspnea. Plan admit telemetry Diagnosis #1 acute dyspnea #2 renal failure #3 abdominal pain #4 urinary tract infection CRITICAL CARE Performed by: Doug Sou   Total critical care time: 30 minute  Critical care time was exclusive of separately billable procedures and treating other patients.  Critical care was necessary to treat or prevent imminent or life-threatening deterioration.  Critical care was time spent personally by me on the following activities: development of treatment plan with patient and/or surrogate as well as nursing, discussions with consultants, evaluation of patient's response to treatment, examination of patient, obtaining history from patient or surrogate, ordering and performing treatments and interventions, ordering and review of laboratory studies, ordering and review of radiographic studies, pulse oximetry and re-evaluation of patient's condition.        Doug Sou, MD 07/12/12 1541  Doug Sou, MD 07/12/12 1541  Doug Sou, MD 07/12/12 4540

## 2012-07-12 NOTE — ED Notes (Signed)
Pt presents to department via [facility] for evaluation of SOB. Ongoing x3 days, also states generalized weakness, intermittent abdominal pain and nausea. Was recently seen and diagnosed with pancreatic cyst. 3/10 pain at the time. No active nausea/vomiting. HD patient, receives Tues, [place], and Sat.  She is alert and oriented x4.

## 2012-07-12 NOTE — Consult Note (Signed)
[name] Requesting Physician:  Dr. Ethelda Chick  Reason for Consult:  High K+, ESRD , abd pain and SOB HPI: The patient is a 62 y.o. year-old with ESRD started HD in July last year, severe DCM EF 20% and chronic pancreatitis with hx pseudocyst/necrosis and pancreatic bed-related infections and bacteremias (see below).  Was just discharged with dx of infected pancreatic fluid and cx's grew enterococcus, but yeast was seen on GS and she was [name]/c'[name] on po antifungals and iv vanc/fortaz at HD. Returns to ED today with SOB, gen weaknees, abd pain.   She looks weaker then last week when she was here.   ROS  no fever or chills  no cough, CP or resp distress  has not missed HD   + n/v  no jt pain or rash  no confusion or hallucination   Past Medical History:  Past Medical History  Diagnosis Date  . Coronary artery disease     s/p CABG 2008 with multiple PCIs  . Ischemic cardiomyopathy     Cath 04/2012, significant calcifications  . History of nonadherence to medical treatment   . Systolic CHF     16/1096 echo EF 10%  . Esophageal varices   . Cirrhosis     hx of fatty liver per patient, has known ascites with repeated paracenteses at [facility] as of 2013, also +hx of esoph varices with banding in the past  . Pacemaker   . ICD (implantable cardiac defibrillator) in place   . Hypertension     "used to have HTN; now I'm low" (05/23/2012)  . NSTEMI (non-ST elevated myocardial infarction)     04/2012; Trop peaked to 1.39; 05/23/2012 pt denies ever having MI  . Pneumonia 02/2012    "first time ever" (05/23/2012)  . Chronic bronchitis 1970's thru 2002    "went away when I stopped smoking in 2002" (05/23/2012)  . Diabetes mellitus     Diet controlled  . History of blood transfusion     "alot of them" (05/23/2012)  . Iron (Fe) deficiency anemia     "severe" (05/23/2012)  . Pancreatitis, chronic 10/04/2011    Had severe gallstone pancreatitis in May 2003, no surgery,  treated medically then returned in June 2003 for cholecystectomy and cyst gastrostomy for pancreatic pseudocyst. In March 2013 was admitted for hemorrhage into psueodcyst. In April 2013 was treated at [facility] for polymicrobial bacteremia (fungal, MRSA and enterobacter) felt to be due to ruptured pseudocyst, treated medically. Admitted Jan 2014 with abd pain and had gas and fluid in pancreatic bed. She refused surgery ("would not make it") and had fluid aspirated by IR- gram stain showed yeast and cultures grew enterococcus species, sensitive to amp and vanc. She was seen by ID and discharged on IV vanc/fortaz with HD and po voriconazole.     [name] ESRD on hemodialysis 05/06/2012    T,Th,Sat HD by [facility] on [address]. Started hemodialysis around July 2013.      Past Surgical History:  Past Surgical History  Procedure Date  . Esophagogastroduodenoscopy 09/15/2011    Procedure: ESOPHAGOGASTRODUODENOSCOPY (EGD);  Surgeon: Theda Belfast, MD;  Location: [facility] ENDOSCOPY;  Service: Endoscopy;  Laterality: N/A;  . Cholecystectomy 2003  . Tonsillectomy and adenoidectomy 1961  . Appendectomy 1973?  [name] Vaginal hysterectomy 1973?  [name] Tubal ligation 1972  . Dilation and curettage of uterus     "bunch from profuse bleeding in the 1970's" (05/23/2012)  . Coronary artery bypass graft 2008  CABG X4  . Coronary angioplasty with stent placement 2008    "1; day after CABG" (05/23/2012)  . Cardiac catheterization     "before 2008 and 3 wk ago" (05/23/2012)  . Insert / replace / remove pacemaker 06/2011    pacemaker ICD  . Cardiac defibrillator placement 06/2011  . Arteriovenous graft placement 03/2012    right antecub  . Insertion of dialysis catheter ~ 10/2011    right chest  . Peritoneal catheter insertion 08/2011  . Peritoneal catheter removal ~ 10/2011    Family History:  Family History  Problem Relation Age of Onset  . Coronary artery disease Mother   . Hypertension Mother   . Diabetes Mother   .  Diabetes Sister   . Anesthesia problems Neg Hx    Social History:  reports that she quit smoking about 12 years ago. Her smoking use included Cigarettes. She has a 10.23 pack-year smoking history. She has never used smokeless tobacco. She reports that she drinks alcohol. She reports that she does not use illicit drugs.  Allergies:  Allergies  Allergen Reactions  . Codeine Itching  . Morphine And Related Shortness Of Breath    SOB when given IV once "to me it was mild; I called out fast for help; never had to intubate" (05/23/2012)  . Ace Inhibitors Other (See Comments)    Unknown reaction but her physician stated that she can't take it (specifically lisinopril)  . Tylenol (Acetaminophen) Other (See Comments)    Patient states that the doctor told her she has a Fatty Liver.    Home medications: Prior to Admission medications   Medication Sig Start Date End Date Taking? Authorizing Provider  aspirin EC 81 MG EC tablet Take 1 tablet (81 mg total) by mouth daily. 05/11/12  Yes Larey Seat, MD  carvedilol (COREG) 6.25 MG tablet Take 1 tablet (6.25 mg total) by mouth 2 (two) times daily with a meal. 05/11/12  Yes Larey Seat, MD  hydrALAZINE (APRESOLINE) 25 MG tablet Take 1 tablet (25 mg total) by mouth every 8 (eight) hours. 05/11/12  Yes Larey Seat, MD  HYDROmorphone (DILAUDID) 2 MG tablet Take 1 tablet (2 mg total) by mouth every 6 (six) hours as needed for pain. For pain 07/07/12  Yes Marinda Elk, MD  isosorbide mononitrate (IMDUR) 30 MG 24 hr tablet Take 1 tablet (30 mg total) by mouth daily. 05/11/12  Yes Larey Seat, MD  methadone (DOLOPHINE) 5 MG tablet Take 2 tablets (10 mg total) by mouth 3 (three) times daily. Takes along with Dilaudid. 07/02/12  Yes Sorin Luanne Bras, MD  multivitamin (RENA-VIT) TABS tablet Take 1 tablet by mouth daily. 10/14/11  Yes Clanford Cyndie Mull, MD  Nutritional Supplements (FEEDING SUPPLEMENT, NEPRO CARB STEADY,) LIQD Take 237 mLs by mouth 2 (two)  times daily with breakfast and lunch. 03/04/12  Yes Srikar Cherlynn Kaiser, MD  pantoprazole (PROTONIX) 40 MG tablet Take 40 mg by mouth daily.   Yes Historical Provider, MD  pravastatin (PRAVACHOL) 20 MG tablet Take 20 mg by mouth at bedtime.    Yes Historical Provider, MD  ranolazine (RANEXA) 500 MG 12 hr tablet Take 500 mg by mouth daily.    Yes Historical Provider, MD  sevelamer (RENAGEL) 400 MG tablet Take 400 mg by mouth daily. With a meal   Yes Historical Provider, MD  voriconazole (VFEND) 200 MG tablet Take 1 tablet (200 mg total) by mouth 2 (two) times daily. 07/05/12  Yes Sorin  Luanne Bras, MD    Labs: Basic Metabolic Panel:  Lab 07/12/12 4098 07/07/12 0801  NA 133* 132*  K 6.3* 4.2  CL 103 95*  CO2 -- 27  GLUCOSE 86 76  BUN 103* 35*  CREATININE 5.60* 3.13*  ALB -- --  CALCIUM -- 10.9*  PHOS -- --   Liver Function Tests: No results found for this basename: AST:3,ALT:3,ALKPHOS:3,BILITOT:3,PROT:3,ALBUMIN:3 in the last 168 hours  Lab 07/12/12 1341  LIPASE 29  AMYLASE --   No results found for this basename: AMMONIA:3 in the last 168 hours CBC:  Lab 07/12/12 1240 07/12/12 1213 07/07/12 0801  WBC -- 18.7* 11.0*  NEUTROABS -- -- --  HGB 10.2* 9.3* 8.5*  HCT 30.0* 29.6* 27.7*  MCV -- 88.6 90.2  PLT -- 318 180   PT/INR: @labrcntip (inr:5) Cardiac Enzymes: No results found for this basename: CKTOTAL:5,CKMB:5,CKMBINDEX:5,TROPONINI:5 in the last 168 hours CBG: No results found for this basename: GLUCAP:5 in the last 168 hours  Physical Exam:  Blood pressure 121/87, pulse 101, temperature 97.8 F (36.6 C), temperature source Oral, resp. rate 20, SpO2 100.00%.  Gen: emaciated pleasant elderly female, no distress, asking for pain meds repeatedly HEENT:  EOMI, sclera anicteric, throat dry Neck: no JVD, no LAN Chest: clear bilat CV: regular, no rub or gallop, pedal pulses diminished Abdomen: soft, moderated diffuse abd tenderness, worse in epig region Ext: no LE edema, no joint  effusion or deformity, no gangrene or ulceration Neuro: alert, oriented, moderate gen weakness, muscle wasting, no asterixis Access: R IJ HD catheter, old AVG L arm  Outpatient HD: (From last admit 1 wk ago) [place] [facility] On TTS, 4 hrs, Edw= 53.5  > 51 after last admit, heparin , EPO 1500 units q hd, no vit [name] last pth=178.2 (05/05/12) Bath= 3.0 k, 2.5 Ca, 1.0 mg   Impression/Plan 1. Dyspnea No evid pulm edema on cxr/exam  2. Abd pain / hx chronic panc / pseudocyst and recurrent pancreatic bed infection On vanc/fortaz at HD and po voriconazole from last admit Enterococcus +cx from panc aspirate 1/14 Doesn't want surgery and prob poor candidate. Talked with patient about considering transition to comfort care. She is getting progressively worse without cure in site for these problems. She will think about it.  3. ESRD Cont hd tts, except hold HD today and do tomorrow instead (pt request) Hep 1600u w hd  4. Hyperkalemia HD in am Wed  5. Severe DCM / EF 20% / ICD  6. FTT / cachexia / chronic pain Takes methadone  7. 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  Anemia of CKD Hb 10 Darbe 25/wk q Erasmo Score  MD [facility] [phone] pgr    [phone] cell 07/12/2012, 3:52 PM

## 2012-07-12 NOTE — H&P (Signed)
[facility] History and Physical  [name] AVW:[id number] DOB: [birth year] DOA: 07/12/2012  Referring physician: Doug Sou, MD PCP: Jyl Heinz, MD  Specialists: [facility]  Chief Complaint: Shortness of breath and generalized weakness  HPI: [name] is a 62 y.o. female with past medical history of ischemic cardiomyopathy, chronic systolic CHF with ejection fraction of 10%, ESRD and history of complicated infected pancreatic pseudocyst. She came in to the hospital because of shortness of breath. Patient said she completed her dialysis on Saturday, but she was feeling progressively weak and short of breath. Today she did not go to dialysis and elected to come to the hospital for further evaluation. She also complaining about periumbilical abdominal pain. Upon initial evaluation in the emergency department she was found to have potassium of 6.3, she is clinically volume overloaded. Patient to be admitted for further evaluation. She was discharged from the hospital 3 days ago on 07/07/2012, after aspiration of her infected pancreatic pseudocyst. The culture showed enterococcus and yeast. Patient is on vancomycin, Fortaz and voriconazole as outpatient.  Review of Systems:  Constitutional: negative for anorexia, fevers and sweats Eyes: negative for irritation, redness and visual disturbance Ears, nose, mouth, throat, and face: negative for earaches, epistaxis, nasal congestion and sore throat Respiratory: Shortness of breath, denies any cough Cardiovascular: Has SOB, she has lower extremity edema and orthopnea. Gastrointestinal: Has abdominal pain, denies any nausea, vomiting or diarrhea. Genitourinary:negative for dysuria, frequency and hematuria Hematologic/lymphatic: negative for bleeding, easy bruising and lymphadenopathy Musculoskeletal:negative for arthralgias, muscle weakness and stiff joints Neurological: negative for coordination problems, gait  problems, headaches and weakness Endocrine: negative for diabetic symptoms including polydipsia, polyuria and weight loss Allergic/Immunologic: negative for anaphylaxis, hay fever and urticaria   Past Medical History  Diagnosis Date  . Coronary artery disease     s/p CABG 2008 with multiple PCIs  . Ischemic cardiomyopathy     Cath 04/2012, significant calcifications  . History of nonadherence to medical treatment   . Systolic CHF     82/9562 echo EF 10%  . Esophageal varices   . Cirrhosis     hx of fatty liver per patient, has known ascites with repeated paracenteses at [facility] as of 2013, also +hx of esoph varices with banding in the past  . Pacemaker   . ICD (implantable cardiac defibrillator) in place   . Hypertension     "used to have HTN; now I'm low" (05/23/2012)  . NSTEMI (non-ST elevated myocardial infarction)     04/2012; Trop peaked to 1.39; 05/23/2012 pt denies ever having MI  . Pneumonia 02/2012    "first time ever" (05/23/2012)  . Chronic bronchitis 1970's thru 2002    "went away when I stopped smoking in 2002" (05/23/2012)  . Diabetes mellitus     Diet controlled  . History of blood transfusion     "alot of them" (05/23/2012)  . Iron (Fe) deficiency anemia     "severe" (05/23/2012)  . Pancreatitis, chronic 10/04/2011    Had severe gallstone pancreatitis in May 2003, no surgery, treated medically then returned in June 2003 for cholecystectomy and cyst gastrostomy for pancreatic pseudocyst. In March 2013 was admitted for hemorrhage into psueodcyst. In April 2013 was treated at [facility] for polymicrobial bacteremia (fungal, MRSA and enterobacter) felt to be due to ruptured pseudocyst, treated medically. Admitted Jan 2014 with abd pain and had gas and fluid in pancreatic bed. She refused surgery ("would not make it") and had fluid aspirated by  IR- gram stain showed yeast and cultures grew enterococcus species, sensitive to amp and vanc. She was seen by ID and discharged on IV vanc/fortaz  with HD and po voriconazole.     [name] ESRD on hemodialysis 05/06/2012    T,Th,Sat HD by [facility] on [address]. Started hemodialysis around July 2013.     Past Surgical History  Procedure Date  . Esophagogastroduodenoscopy 09/15/2011    Procedure: ESOPHAGOGASTRODUODENOSCOPY (EGD);  Surgeon: Theda Belfast, MD;  Location: [facility] ENDOSCOPY;  Service: Endoscopy;  Laterality: N/A;  . Cholecystectomy 2003  . Tonsillectomy and adenoidectomy 1961  . Appendectomy 1973?  [name] Vaginal hysterectomy 1973?  [name] Tubal ligation 1972  . Dilation and curettage of uterus     "bunch from profuse bleeding in the 1970's" (05/23/2012)  . Coronary artery bypass graft 2008    CABG X4  . Coronary angioplasty with stent placement 2008    "1; day after CABG" (05/23/2012)  . Cardiac catheterization     "before 2008 and 3 wk ago" (05/23/2012)  . Insert / replace / remove pacemaker 06/2011    pacemaker ICD  . Cardiac defibrillator placement 06/2011  . Arteriovenous graft placement 03/2012    right antecub  . Insertion of dialysis catheter ~ 10/2011    right chest  . Peritoneal catheter insertion 08/2011  . Peritoneal catheter removal ~ 10/2011   Social History:  reports that she quit smoking about 12 years ago. Her smoking use included Cigarettes. She has a 10.23 pack-year smoking history. She has never used smokeless tobacco. She reports that she drinks alcohol. She reports that she does not use illicit drugs. She lives at home with her stepdaughter  Allergies  Allergen Reactions  . Codeine Itching  . Morphine And Related Shortness Of Breath    SOB when given IV once "to me it was mild; I called out fast for help; never had to intubate" (05/23/2012)  . Ace Inhibitors Other (See Comments)    Unknown reaction but her physician stated that she can't take it (specifically lisinopril)  . Tylenol (Acetaminophen) Other (See Comments)    Patient states that the doctor told her she has a Fatty Liver.    Family History    Problem Relation Age of Onset  . Coronary artery disease Mother   . Hypertension Mother   . Diabetes Mother   . Diabetes Sister   . Anesthesia problems Neg Hx     Prior to Admission medications   Medication Sig Start Date End Date Taking? Authorizing Provider  aspirin EC 81 MG EC tablet Take 1 tablet (81 mg total) by mouth daily. 05/11/12  Yes Larey Seat, MD  carvedilol (COREG) 6.25 MG tablet Take 1 tablet (6.25 mg total) by mouth 2 (two) times daily with a meal. 05/11/12  Yes Larey Seat, MD  hydrALAZINE (APRESOLINE) 25 MG tablet Take 1 tablet (25 mg total) by mouth every 8 (eight) hours. 05/11/12  Yes Larey Seat, MD  HYDROmorphone (DILAUDID) 2 MG tablet Take 1 tablet (2 mg total) by mouth every 6 (six) hours as needed for pain. For pain 07/07/12  Yes Marinda Elk, MD  isosorbide mononitrate (IMDUR) 30 MG 24 hr tablet Take 1 tablet (30 mg total) by mouth daily. 05/11/12  Yes Larey Seat, MD  methadone (DOLOPHINE) 5 MG tablet Take 2 tablets (10 mg total) by mouth 3 (three) times daily. Takes along with Dilaudid. 07/02/12  Yes Sorin Luanne Bras, MD  multivitamin (RENA-VIT) TABS  tablet Take 1 tablet by mouth daily. 10/14/11  Yes Clanford Cyndie Mull, MD  Nutritional Supplements (FEEDING SUPPLEMENT, NEPRO CARB STEADY,) LIQD Take 237 mLs by mouth 2 (two) times daily with breakfast and lunch. 03/04/12  Yes Srikar Cherlynn Kaiser, MD  pantoprazole (PROTONIX) 40 MG tablet Take 40 mg by mouth daily.   Yes Historical Provider, MD  pravastatin (PRAVACHOL) 20 MG tablet Take 20 mg by mouth at bedtime.    Yes Historical Provider, MD  ranolazine (RANEXA) 500 MG 12 hr tablet Take 500 mg by mouth daily.    Yes Historical Provider, MD  sevelamer (RENAGEL) 400 MG tablet Take 400 mg by mouth daily. With a meal   Yes Historical Provider, MD  voriconazole (VFEND) 200 MG tablet Take 1 tablet (200 mg total) by mouth 2 (two) times daily. 07/05/12  Yes Sorin Luanne Bras, MD   Physical Exam: Filed Vitals:   07/12/12 1016  07/12/12 1326 07/12/12 1451  BP: 126/88 128/83 121/87  Pulse: 95 94 101  Temp: 97.8 F (36.6 C)    TempSrc: Oral    Resp: 22 20 20   SpO2: 97% 96% 100%  General appearance: Alert and awake, cachectic, debilitated, chronically ill looking lady, in no acute distress Head: Normocephalic, without obvious abnormality, atraumatic  Eyes: conjunctivae/corneas clear. PERRL, EOM's intact. Fundi benign.  Nose: Nares normal. Septum midline. Mucosa normal. No drainage or sinus tenderness.  Throat: lips, mucosa, and tongue normal; teeth and gums normal  Neck: Supple, no masses, no cervical lymphadenopathy, no JVD appreciated, no meningeal signs Resp: Bibasilar crackles Chest wall: no tenderness  Cardio: regular rate and rhythm, S1, S2 normal, no murmur, click, rub or gallop  GI: soft, non-tender; bowel sounds normal; no masses, no organomegaly  Extremities: She has +1 bilateral pedal edema. Skin: Skin color, texture, turgor normal. No rashes or lesions  Neurologic: Alert and oriented X 3, normal strength and tone. Normal symmetric reflexes. Normal coordination and gait   Labs on Admission:  Basic Metabolic Panel:  Lab 07/12/12 4098 07/07/12 0801  NA 133* 132*  K 6.3* 4.2  CL 103 95*  CO2 -- 27  GLUCOSE 86 76  BUN 103* 35*  CREATININE 5.60* 3.13*  CALCIUM -- 10.9*  MG -- --  PHOS -- --   Liver Function Tests: No results found for this basename: AST:5,ALT:5,ALKPHOS:5,BILITOT:5,PROT:5,ALBUMIN:5 in the last 168 hours  Lab 07/12/12 1341  LIPASE 29  AMYLASE --   No results found for this basename: AMMONIA:5 in the last 168 hours CBC:  Lab 07/12/12 1240 07/12/12 1213 07/07/12 0801  WBC -- 18.7* 11.0*  NEUTROABS -- -- --  HGB 10.2* 9.3* 8.5*  HCT 30.0* 29.6* 27.7*  MCV -- 88.6 90.2  PLT -- 318 180   Cardiac Enzymes: No results found for this basename: CKTOTAL:5,CKMB:5,CKMBINDEX:5,TROPONINI:5 in the last 168 hours  BNP (last 3 results)  Basename 06/02/12 1944 05/08/12 0515  04/21/12 1419  PROBNP >70000.0* >70000.0* >70000.0*   CBG: No results found for this basename: GLUCAP:5 in the last 168 hours  Radiological Exams on Admission: Ct Abdomen Pelvis Wo Contrast  07/12/2012  *RADIOLOGY REPORT*  Clinical Data: Shortness of breath, intermittent abdominal pain and nausea.  Recent history of necrotizing pancreatitis.  CT ABDOMEN AND PELVIS WITHOUT CONTRAST  Technique:  Multidetector CT imaging of the abdomen and pelvis was performed following the standard protocol without intravenous contrast.  Comparison: None.  Findings: The patient declined oral contrast.  Streak artifact from defibrillator leads noted.  Marked enlargement of the  heart again noted.  Trace right pleural effusion with associated compressive atelectasis noted.  This is stable compared to the prior exam. Left basilar dependent atelectasis again noted.  There has been slight decrease in abdominal ascites since previously. Cholecystectomy clips noted.  The pancreas is ill- defined with a more focal area of hypodensity in the region of the tail which could represent an abscess or other fluid collection, for example image 26.  Scattered foci of peritoneal gas are again noted.  Subjectively, this is minimally decreased since previously. Extensive vascular calcification is noted.  The lack of contrast, vascular patency cannot be assessed to determine interval change in previously seen portal vein thrombosis.  There is diffuse mild prominence of the ascending colon and proximal transverse colon, for example image 40, but this is difficult to separate from surrounding fluid.  No pneumatosis is identified. This finding appears new since prior exam.  Large pelvic ascites is noted.  Uterus and ovaries not visualized. Bladder is decompressed.  It is not well visualized separate from large pelvic ascites, but is visualized on coronal and sagittal re- formations, for example coronal image 35 and sagittal image 78. Large amount of  stool noted within the rectum.  Anasarca/third spacing noted.  Due to lack of IV contrast, this significantly decreases conspicuity of detail within the solid and hollow viscera.  IMPRESSION: Interval development of ascending colonic and proximal transverse colon wall thickening, which differential considerations include ischemia, infectious/inflammatory colitis, or less likely edema related to anasarca/third spacing.  Slight decrease in ascites.  Slight decrease in peripancreatic gas.  Persistent hypodensity in the tail of the pancreas which could represent a pseudocyst or peripancreatic abscess.  Stable right pleural effusion and bibasilar atelectasis.  Suboptimal visualization due to lack of oral and IV contrast.   Original Report Authenticated By: Christiana Pellant, M.D.    Dg Chest Port 1 View  07/12/2012  *RADIOLOGY REPORT*  Clinical Data: Short of breath  PORTABLE CHEST - 1 VIEW  Comparison: 06/25/2012  Findings: Prominent cardiomegaly is stable.  Left subclavian AICD device and leads are stable.  Right internal jugular dialysis catheter stable.  Stable vascular congestion.  No pulmonary edema. Tiny pleural effusions stable.  IMPRESSION: Improved edema.  Mild vascular congestion persist.   Original Report Authenticated By: Jolaine Click, M.D.     EKG: Independently reviewed.   Assessment/Plan Active Problems:  Ischemic cardiomyopathy, EF 20-25% echo April 2013, now 10% by cath (05/06/12)  Chronic systolic congestive heart failure, NYHA class 3  LBBB (left bundle branch block)  Pancreatitis, chronic  ESRD on hemodialysis  Hyperkalemia   Shortness of breath -Although she looks clinically overloaded, chest x-ray and clinical examination does not show pulmonary edema. -She is getting progressively worse overall. -No evidence of pneumonia, pulmonary edema per chest x-ray. -Patient supposed to have dialysis today, she does not want, HD will be in the morning.  Infected hemorrhagic pancreatic  pseudocyst -Status post aspiration on 06/28/2012, aspirate grew enterococcus and yeast. -Patient is on Fortaz, vancomycin and voriconazole at home. -This is being chronic problem, seen by general surgery earlier this month and she refused surgical excision.  ESRD -On dialysis on Tuesday, Thursday and Saturday.  -[facility] notified about the admission, patient will be dialyzed in the morning.  Hyperkalemia -Per nephrology, given Kayexalate and patient will have dialysis in a.m.  Chronic systolic CHF, severe -With LVEF of 10% on 05/08/2012 by 2-D echocardiogram. -Has Lorcet edema and orthopnea, no significant pulmonary edema or pleural effusion. -  She is on Coreg, Imdur and hydralazine. -She has ICD in place, she has LBBB, not sure this is ICD or biventricular pacer.  Chronic abdominal pain -She is on methadone and oral Dilaudid. Chronic abdominal pain secondary to chronic pancreatic bed problems. -Had long standing history of chronic pancreatitis and infected pancreatic pseudocyst  Disposition -Patient said she is very weak, to the point she couldn't walk. -I asked PT/OT to evaluate her, she is likely to need an SNF placement. -Discussed with Dr. Arlean Hopping of [facility], we will consider hospice/palliative evaluation  Code Status: DNR/DNI Family Communication: D/W patient Disposition Plan: Inpatient, telemetry  Time spent: 70 minutes  [facility] Pager [phone]  If 7PM-7AM, please contact night-coverage [email or web address] Password [employer] 07/12/2012, 4:33 PM

## 2012-07-12 NOTE — ED Notes (Signed)
Floor nurse unable to take report at the time. To call back. 

## 2012-07-12 NOTE — Progress Notes (Signed)
ANTIBIOTIC CONSULT NOTE - INITIAL  Pharmacy Consult for Vancomycin/Fortaz Indication: infected pancreatic pseudocyst  Allergies  Allergen Reactions  . Codeine Itching  . Morphine And Related Shortness Of Breath    SOB when given IV once "to me it was mild; I called out fast for help; never had to intubate" (05/23/2012)  . Ace Inhibitors Other (See Comments)    Unknown reaction but her physician stated that she can't take it (specifically lisinopril)  . Tylenol (Acetaminophen) Other (See Comments)    Patient states that the doctor told her she has a Fatty Liver.    Patient Measurements: Height: 5' 4.17" (163 cm) Weight: 113 lb 12.1 oz (51.6 kg) IBW/kg (Calculated) : 55.1   Vital Signs: Temp: 97.8 F (36.6 C) (01/28 1016) Temp src: Oral (01/28 1016) BP: 121/87 mmHg (01/28 1451) Pulse Rate: 101  (01/28 1451) Intake/Output from previous day:   Intake/Output from this shift:    Labs:  Basename 07/12/12 1240 07/12/12 1213  WBC -- 18.7*  HGB 10.2* 9.3*  PLT -- 318  LABCREA -- --  CREATININE 5.60* --   Estimated Creatinine Clearance: 8.4 ml/min (by C-G formula based on Cr of 5.6). No results found for this basename: VANCOTROUGH:2,VANCOPEAK:2,VANCORANDOM:2,GENTTROUGH:2,GENTPEAK:2,GENTRANDOM:2,TOBRATROUGH:2,TOBRAPEAK:2,TOBRARND:2,AMIKACINPEAK:2,AMIKACINTROU:2,AMIKACIN:2, in the last 72 hours   Microbiology: Recent Results (from the past 720 hour(s))  MRSA PCR SCREENING     Status: Normal   Collection Time   06/26/12  3:20 AM      Component Value Range Status Comment   MRSA by PCR NEGATIVE  NEGATIVE Final   URINE CULTURE     Status: Normal   Collection Time   06/27/12  6:36 AM      Component Value Range Status Comment   Specimen Description URINE, RANDOM   Final    Special Requests NONE   Final    Culture  Setup Time 06/27/2012 16:26   Final    Colony Count 40,000 COLONIES/ML   Final    Culture     Final    Value: Multiple bacterial morphotypes present, none  predominant. Suggest appropriate recollection if clinically indicated.   Report Status 06/28/2012 FINAL   Final   CULTURE, ROUTINE-ABSCESS     Status: Normal   Collection Time   06/28/12  4:26 PM      Component Value Range Status Comment   Specimen Description ABSCESS   Final    Special Requests LEFT RETROPERITONEAL FLUID   Final    Gram Stain     Final    Value: ABUNDANT WBC PRESENT,BOTH PMN AND MONONUCLEAR     FEW YEAST   Culture     Final    Value: FEW ENTEROCOCCUS SPECIES     FEW YEAST CONSISTENT WITH CANDIDA SPECIES   Report Status 07/05/2012 FINAL   Final    Organism ID, Bacteria ENTEROCOCCUS SPECIES   Final   ANAEROBIC CULTURE     Status: Normal   Collection Time   06/28/12  4:26 PM      Component Value Range Status Comment   Specimen Description ABSCESS   Final    Special Requests LEFT RETROPERITONEAL FLUID   Final    Gram Stain     Final    Value: ABUNDANT WBC PRESENT,BOTH PMN AND MONONUCLEAR     FEW YEAST   Culture NO ANAEROBES ISOLATED   Final    Report Status 07/03/2012 FINAL   Final   FUNGUS CULTURE W SMEAR     Status: Normal (Preliminary result)   Collection  Time   06/28/12  4:26 PM      Component Value Range Status Comment   Specimen Description ABSCESS   Final    Special Requests LEFT RETROPERITONEAL FLUID   Final    Fungal Smear NO YEAST OR FUNGAL ELEMENTS SEEN   Final    Culture YEAST ISOLATED;ID TO FOLLOW   Final    Report Status PENDING   Incomplete     Medical History: Past Medical History  Diagnosis Date  . Coronary artery disease     s/p CABG 2008 with multiple PCIs  . Ischemic cardiomyopathy     Cath 04/2012, significant calcifications  . History of nonadherence to medical treatment   . Systolic CHF     40/9811 echo EF 10%  . Esophageal varices   . Cirrhosis     hx of fatty liver per patient, has known ascites with repeated paracenteses at [facility] as of 2013, also +hx of esoph varices with banding in the past  . Pacemaker   . ICD (implantable  cardiac defibrillator) in place   . Hypertension     "used to have HTN; now I'm low" (05/23/2012)  . NSTEMI (non-ST elevated myocardial infarction)     04/2012; Trop peaked to 1.39; 05/23/2012 pt denies ever having MI  . Pneumonia 02/2012    "first time ever" (05/23/2012)  . Chronic bronchitis 1970's thru 2002    "went away when I stopped smoking in 2002" (05/23/2012)  . Diabetes mellitus     Diet controlled  . History of blood transfusion     "alot of them" (05/23/2012)  . Iron (Fe) deficiency anemia     "severe" (05/23/2012)  . Pancreatitis, chronic 10/04/2011    Had severe gallstone pancreatitis in May 2003, no surgery, treated medically then returned in June 2003 for cholecystectomy and cyst gastrostomy for pancreatic pseudocyst. In March 2013 was admitted for hemorrhage into psueodcyst. In April 2013 was treated at [facility] for polymicrobial bacteremia (fungal, MRSA and enterobacter) felt to be due to ruptured pseudocyst, treated medically. Admitted Jan 2014 with abd pain and had gas and fluid in pancreatic bed. She refused surgery ("would not make it") and had fluid aspirated by IR- gram stain showed yeast and cultures grew enterococcus species, sensitive to amp and vanc. She was seen by ID and discharged on IV vanc/fortaz with HD and po voriconazole.     [name] ESRD on hemodialysis 05/06/2012    T,Th,Sat HD by [facility] on [address]. Started hemodialysis around July 2013.      Medications:   (Not in a hospital admission) Assessment: 62 y/o female patient admitted with abdominal pain and shortness of breath, receiving outpatient vancomycin and fortaz at dialysis, day #14/28 therapy for infected pancreatic pseudocysts. Last HD was Saturday and did not receive today, plan for HD tomorrow.  Goal of Therapy:  Pre HD vanc level 15-25  Plan:  F/u next HD session with Vancomycin 500mg  and Fortaz 2g QHD.  Verlene Mayer, PharmD, BCPS Pager [phone] 07/12/2012,5:02 PM

## 2012-07-12 NOTE — ED Notes (Signed)
Nephrology at bedside

## 2012-07-13 ENCOUNTER — Encounter (HOSPITAL_COMMUNITY): Payer: Self-pay | Admitting: Nephrology

## 2012-07-13 DIAGNOSIS — I509 Heart failure, unspecified: Secondary | ICD-10-CM

## 2012-07-13 DIAGNOSIS — T829XXA Unspecified complication of cardiac and vascular prosthetic device, implant and graft, initial encounter: Secondary | ICD-10-CM

## 2012-07-13 DIAGNOSIS — I5022 Chronic systolic (congestive) heart failure: Secondary | ICD-10-CM

## 2012-07-13 HISTORY — DX: Unspecified complication of cardiac and vascular prosthetic device, implant and graft, initial encounter: T82.9XXA

## 2012-07-13 LAB — CBC
HCT: 31.3 % — ABNORMAL LOW (ref 36.0–46.0)
Hemoglobin: 10.5 g/dL — ABNORMAL LOW (ref 12.0–15.0)
MCH: 28.9 pg (ref 26.0–34.0)
MCHC: 33.5 g/dL (ref 30.0–36.0)
MCV: 86.2 fL (ref 78.0–100.0)
Platelets: 251 10*3/uL (ref 150–400)
RBC: 3.63 MIL/uL — ABNORMAL LOW (ref 3.87–5.11)
RDW: 17.4 % — ABNORMAL HIGH (ref 11.5–15.5)
WBC: 14 10*3/uL — ABNORMAL HIGH (ref 4.0–10.5)

## 2012-07-13 LAB — RENAL FUNCTION PANEL
CO2: 26 mEq/L (ref 19–32)
Chloride: 100 mEq/L (ref 96–112)
Creatinine, Ser: 2.96 mg/dL — ABNORMAL HIGH (ref 0.50–1.10)
GFR calc Af Amer: 18 mL/min — ABNORMAL LOW (ref >90)
GFR calc non Af Amer: 16 mL/min — ABNORMAL LOW (ref >90)
Sodium: 141 mEq/L (ref 135–145)

## 2012-07-13 LAB — GLUCOSE, CAPILLARY
Glucose-Capillary: 84 mg/dL (ref 70–99)
Glucose-Capillary: 83 mg/dL (ref 70–99)

## 2012-07-13 LAB — URINE CULTURE
Colony Count: NO GROWTH
Culture: NO GROWTH

## 2012-07-13 MED ORDER — PENTAFLUOROPROP-TETRAFLUOROETH EX AERO: 1.0000 "application " | INHALATION_SPRAY | CUTANEOUS | Status: DC | PRN

## 2012-07-13 MED ORDER — LIDOCAINE-PRILOCAINE 2.5-2.5 % EX CREA: 1.0000 "application " | TOPICAL_CREAM | CUTANEOUS | Status: DC | PRN

## 2012-07-13 MED ORDER — RANOLAZINE ER 500 MG PO TB12
500.0000 mg | ORAL_TABLET | Freq: Every day | ORAL | Status: DC
  Administered 2012-07-13 – 2012-07-20 (×8): 500 mg via ORAL
  Filled 2012-07-13 (×9): qty 1

## 2012-07-13 MED ORDER — LIDOCAINE-PRILOCAINE 2.5-2.5 % EX CREA
1.0000 "application " | TOPICAL_CREAM | CUTANEOUS | Status: DC | PRN
  Filled 2012-07-13: qty 5

## 2012-07-13 MED ORDER — HYDROMORPHONE HCL PF 1 MG/ML IJ SOLN
INTRAMUSCULAR | Status: AC
  Filled 2012-07-13: qty 1

## 2012-07-13 MED ORDER — ASPIRIN EC 81 MG PO TBEC
81.0000 mg | DELAYED_RELEASE_TABLET | Freq: Every day | ORAL | Status: DC
  Administered 2012-07-13 – 2012-07-20 (×8): 81 mg via ORAL
  Filled 2012-07-13 (×8): qty 1

## 2012-07-13 MED ORDER — HEPARIN SODIUM (PORCINE) 1000 UNIT/ML DIALYSIS
1600.0000 [IU] | INTRAMUSCULAR | Status: DC | PRN
  Administered 2012-07-13: 1600 [IU] via INTRAVENOUS_CENTRAL

## 2012-07-13 MED ORDER — SODIUM CHLORIDE 0.9 % IV SOLN: 100.0000 mL | INTRAVENOUS | Status: DC | PRN

## 2012-07-13 MED ORDER — HEPARIN SODIUM (PORCINE) 5000 UNIT/ML IJ SOLN
5000.0000 [IU] | Freq: Three times a day (TID) | INTRAMUSCULAR | Status: DC
  Administered 2012-07-13 – 2012-07-20 (×20): 5000 [IU] via SUBCUTANEOUS
  Filled 2012-07-13 (×24): qty 1

## 2012-07-13 MED ORDER — HEPARIN SODIUM (PORCINE) 1000 UNIT/ML DIALYSIS
1000.0000 [IU] | INTRAMUSCULAR | Status: DC | PRN
  Filled 2012-07-13: qty 1

## 2012-07-13 MED ORDER — HEPARIN SODIUM (PORCINE) 1000 UNIT/ML DIALYSIS
1600.0000 [IU] | INTRAMUSCULAR | Status: DC | PRN
  Filled 2012-07-13: qty 2

## 2012-07-13 MED ORDER — CARVEDILOL 6.25 MG PO TABS
6.2500 mg | ORAL_TABLET | Freq: Two times a day (BID) | ORAL | Status: DC
  Administered 2012-07-13 – 2012-07-18 (×8): 6.25 mg via ORAL
  Filled 2012-07-13 (×12): qty 1

## 2012-07-13 MED ORDER — DEXTROSE 5 % IV SOLN
2.0000 g | Freq: Once | INTRAVENOUS | Status: AC
  Administered 2012-07-13: 2 g via INTRAVENOUS
  Filled 2012-07-13: qty 2

## 2012-07-13 MED ORDER — HEPARIN SODIUM (PORCINE) 1000 UNIT/ML DIALYSIS: 1000.0000 [IU] | INTRAMUSCULAR | Status: DC | PRN

## 2012-07-13 MED ORDER — ONDANSETRON HCL 4 MG PO TABS: 4.0000 mg | ORAL_TABLET | Freq: Four times a day (QID) | ORAL | Status: DC | PRN

## 2012-07-13 MED ORDER — LIDOCAINE HCL (PF) 1 % IJ SOLN: 5.0000 mL | INTRAMUSCULAR | Status: DC | PRN

## 2012-07-13 MED ORDER — HYDRALAZINE HCL 25 MG PO TABS
25.0000 mg | ORAL_TABLET | Freq: Three times a day (TID) | ORAL | Status: DC
  Administered 2012-07-13 – 2012-07-18 (×9): 25 mg via ORAL
  Filled 2012-07-13 (×18): qty 1

## 2012-07-13 MED ORDER — HYDROMORPHONE HCL PF 1 MG/ML IJ SOLN
2.0000 mg | INTRAMUSCULAR | Status: DC | PRN
  Administered 2012-07-13: 1 mg via INTRAVENOUS
  Administered 2012-07-13 – 2012-07-20 (×21): 2 mg via INTRAVENOUS
  Filled 2012-07-13 (×11): qty 2
  Filled 2012-07-13: qty 1
  Filled 2012-07-13 (×12): qty 2

## 2012-07-13 MED ORDER — ONDANSETRON HCL 4 MG/2ML IJ SOLN
4.0000 mg | Freq: Four times a day (QID) | INTRAMUSCULAR | Status: DC | PRN
  Administered 2012-07-15 – 2012-07-20 (×3): 4 mg via INTRAVENOUS
  Filled 2012-07-13 (×2): qty 2

## 2012-07-13 MED ORDER — RENA-VITE PO TABS
1.0000 | ORAL_TABLET | Freq: Every day | ORAL | Status: DC
  Administered 2012-07-13 – 2012-07-20 (×8): 1 via ORAL
  Filled 2012-07-13 (×9): qty 1

## 2012-07-13 MED ORDER — PANTOPRAZOLE SODIUM 40 MG PO TBEC
40.0000 mg | DELAYED_RELEASE_TABLET | Freq: Every day | ORAL | Status: DC
  Administered 2012-07-13 – 2012-07-20 (×8): 40 mg via ORAL
  Filled 2012-07-13 (×8): qty 1

## 2012-07-13 MED ORDER — NEPRO/CARBSTEADY PO LIQD
237.0000 mL | ORAL | Status: DC | PRN
  Filled 2012-07-13: qty 237

## 2012-07-13 MED ORDER — ALTEPLASE 2 MG IJ SOLR
2.0000 mg | Freq: Once | INTRAMUSCULAR | Status: DC | PRN
  Filled 2012-07-13: qty 2

## 2012-07-13 MED ORDER — VORICONAZOLE 200 MG PO TABS
200.0000 mg | ORAL_TABLET | Freq: Two times a day (BID) | ORAL | Status: DC
  Filled 2012-07-13 (×2): qty 1

## 2012-07-13 MED ORDER — ISOSORBIDE MONONITRATE ER 30 MG PO TB24
30.0000 mg | ORAL_TABLET | Freq: Every day | ORAL | Status: DC
  Administered 2012-07-13 – 2012-07-18 (×6): 30 mg via ORAL
  Filled 2012-07-13 (×6): qty 1

## 2012-07-13 MED ORDER — METHADONE HCL 5 MG PO TABS
10.0000 mg | ORAL_TABLET | Freq: Three times a day (TID) | ORAL | Status: DC
  Administered 2012-07-13 – 2012-07-20 (×21): 10 mg via ORAL
  Filled 2012-07-13: qty 1
  Filled 2012-07-13: qty 2
  Filled 2012-07-13: qty 1
  Filled 2012-07-13 (×2): qty 2
  Filled 2012-07-13: qty 1
  Filled 2012-07-13 (×5): qty 2
  Filled 2012-07-13: qty 1
  Filled 2012-07-13 (×10): qty 2
  Filled 2012-07-13 (×2): qty 1

## 2012-07-13 MED ORDER — NEPRO/CARBSTEADY PO LIQD
237.0000 mL | Freq: Two times a day (BID) | ORAL | Status: DC
  Administered 2012-07-13 – 2012-07-20 (×11): 237 mL via ORAL

## 2012-07-13 MED ORDER — ALTEPLASE 2 MG IJ SOLR
2.0000 mg | Freq: Once | INTRAMUSCULAR | Status: AC | PRN
  Filled 2012-07-13: qty 2

## 2012-07-13 MED ORDER — VANCOMYCIN HCL 500 MG IV SOLR
500.0000 mg | INTRAVENOUS | Status: AC
  Administered 2012-07-13: 500 mg via INTRAVENOUS
  Filled 2012-07-13 (×2): qty 500

## 2012-07-13 MED ORDER — SODIUM CHLORIDE 0.9 % IJ SOLN
3.0000 mL | Freq: Two times a day (BID) | INTRAMUSCULAR | Status: DC
  Administered 2012-07-13 – 2012-07-20 (×12): 3 mL via INTRAVENOUS

## 2012-07-13 MED ORDER — SEVELAMER CARBONATE 800 MG PO TABS
400.0000 mg | ORAL_TABLET | Freq: Three times a day (TID) | ORAL | Status: DC
  Administered 2012-07-14 (×2): 400 mg via ORAL
  Filled 2012-07-13 (×10): qty 0.5

## 2012-07-13 MED ORDER — NEPRO/CARBSTEADY PO LIQD: 237.0000 mL | ORAL | Status: DC | PRN

## 2012-07-13 MED ORDER — VORICONAZOLE 200 MG PO TABS
200.0000 mg | ORAL_TABLET | Freq: Two times a day (BID) | ORAL | Status: DC
  Administered 2012-07-13: 200 mg via ORAL
  Filled 2012-07-13 (×4): qty 1

## 2012-07-13 NOTE — Progress Notes (Signed)
Utilization review completed.  

## 2012-07-13 NOTE — Progress Notes (Signed)
[facility] PROGRESS NOTE  [name] XBM:[id number] DOB: 08-03-1950 DOA: 07/12/2012 PCP: Jyl Heinz, MD  Assessment/Plan:  Shortness of breath  - Although she looks clinically overloaded, chest x-ray and clinical examination does not show pulmonary edema.  - She is getting progressively worse overall.  - No evidence of pneumonia, pulmonary edema per chest x-ray.  - will undergo HD this morning   Infected hemorrhagic pancreatic pseudocyst  - Status post aspiration on 06/28/2012, aspirate grew enterococcus and yeast.  - Patient is on Fortaz, vancomycin and voriconazole at home.  - This is being chronic problem, seen by general surgery earlier this month and she refused surgical excision.   ESRD  - On dialysis on Tuesday, Thursday and Saturday.  - nephrology following, appreciate their input  Hyperkalemia  - HD today  Chronic systolic CHF, severe  -With LVEF of 10% on 05/08/2012 by 2-D echocardiogram.  -Has Lorcet edema and orthopnea, no significant pulmonary edema or pleural effusion.  -She is on Coreg, Imdur and hydralazine.  -She has ICD in place, she has LBBB,  Chronic abdominal pain  -She is on methadone and oral Dilaudid. Chronic abdominal pain secondary to chronic pancreatic bed problems.  -Had long standing history of chronic pancreatitis and infected pancreatic pseudocyst   Disposition  -Patient said she is very weak, to the point she couldn't walk.  -I asked PT/OT to evaluate her, she is likely to need an SNF placement.  -Discussed with Dr. Arlean Hopping of [facility], we will consider hospice/palliative evaluation  Code Status: DNR/DNI Family Communication: none (indicate person spoken with, relationship, and if by phone, the number) Disposition Plan: pending HD and PT and/or discussions with palliative.   Consultants:  Nephrology  Procedures:  none  Antibiotics (started last hospitalization ~06/29/12 with plans for a  month)  Vancomycin  Ceftazidime  Voriconazole   HPI/Subjective: Endorses feeling tired and weak.  Objective: Filed Vitals:   07/12/12 1854 07/12/12 2153 07/13/12 0511 07/13/12 0740  BP: 114/80 123/89 123/87 120/80  Pulse: 98 90 99 99  Temp: 97.7 F (36.5 C) 97.3 F (36.3 C) 97.3 F (36.3 C) 98 F (36.7 C)  TempSrc: Oral Oral Oral Oral  Resp: 20 18 19 20   Height:      Weight:  51.619 kg (113 lb 12.8 oz)    SpO2: 97% 96% 95% 95%   No intake or output data in the 24 hours ending 07/13/12 1210 Filed Weights   07/12/12 1451 07/12/12 2153  Weight: 51.6 kg (113 lb 12.1 oz) 51.619 kg (113 lb 12.8 oz)    Exam:   General:  Frail appearing AA woman  Cardiovascular: RRR  Respiratory: CTA biL, no crackles  Abdomen: soft, NTTP  MSK - trace edema  Data Reviewed: Basic Metabolic Panel:  Lab 07/12/12 0272 07/07/12 0801  NA 133* 132*  K 6.3* 4.2  CL 103 95*  CO2 -- 27  GLUCOSE 86 76  BUN 103* 35*  CREATININE 5.60* 3.13*  CALCIUM -- 10.9*  MG -- --  PHOS -- --    Lab 07/12/12 1341  LIPASE 29  AMYLASE --   CBC:  Lab 07/12/12 1240 07/12/12 1213 07/07/12 0801  WBC -- 18.7* 11.0*  NEUTROABS -- -- --  HGB 10.2* 9.3* 8.5*  HCT 30.0* 29.6* 27.7*  MCV -- 88.6 90.2  PLT -- 318 180   BNP (last 3 results)  Basename 06/02/12 1944 05/08/12 0515 04/21/12 1419  PROBNP >70000.0* >70000.0* >70000.0*   CBG:  Lab  07/13/12 0753 07/12/12 2122 07/12/12 2011 07/12/12 1944 07/12/12 1906  GLUCAP 84 112* 95 63* 51*    Recent Results (from the past 240 hour(s))  URINE CULTURE     Status: Normal   Collection Time   07/12/12  1:32 PM      Component Value Range Status Comment   Specimen Description URINE, CATHETERIZED   Final    Special Requests NONE   Final    Culture  Setup Time 07/12/2012 14:21   Final    Colony Count NO GROWTH   Final    Culture NO GROWTH   Final    Report Status 07/13/2012 FINAL   Final      Studies: Ct Abdomen Pelvis Wo Contrast  07/12/2012   *RADIOLOGY REPORT*  Clinical Data: Shortness of breath, intermittent abdominal pain and nausea.  Recent history of necrotizing pancreatitis.  CT ABDOMEN AND PELVIS WITHOUT CONTRAST  Technique:  Multidetector CT imaging of the abdomen and pelvis was performed following the standard protocol without intravenous contrast.  Comparison: None.  Findings: The patient declined oral contrast.  Streak artifact from defibrillator leads noted.  Marked enlargement of the heart again noted.  Trace right pleural effusion with associated compressive atelectasis noted.  This is stable compared to the prior exam. Left basilar dependent atelectasis again noted.  There has been slight decrease in abdominal ascites since previously. Cholecystectomy clips noted.  The pancreas is ill- defined with a more focal area of hypodensity in the region of the tail which could represent an abscess or other fluid collection, for example image 26.  Scattered foci of peritoneal gas are again noted.  Subjectively, this is minimally decreased since previously. Extensive vascular calcification is noted.  The lack of contrast, vascular patency cannot be assessed to determine interval change in previously seen portal vein thrombosis.  There is diffuse mild prominence of the ascending colon and proximal transverse colon, for example image 40, but this is difficult to separate from surrounding fluid.  No pneumatosis is identified. This finding appears new since prior exam.  Large pelvic ascites is noted.  Uterus and ovaries not visualized. Bladder is decompressed.  It is not well visualized separate from large pelvic ascites, but is visualized on coronal and sagittal re- formations, for example coronal image 35 and sagittal image 78. Large amount of stool noted within the rectum.  Anasarca/third spacing noted.  Due to lack of IV contrast, this significantly decreases conspicuity of detail within the solid and hollow viscera.  IMPRESSION: Interval development  of ascending colonic and proximal transverse colon wall thickening, which differential considerations include ischemia, infectious/inflammatory colitis, or less likely edema related to anasarca/third spacing.  Slight decrease in ascites.  Slight decrease in peripancreatic gas.  Persistent hypodensity in the tail of the pancreas which could represent a pseudocyst or peripancreatic abscess.  Stable right pleural effusion and bibasilar atelectasis.  Suboptimal visualization due to lack of oral and IV contrast.   Original Report Authenticated By: Christiana Pellant, M.D.    Dg Chest Port 1 View  07/12/2012  *RADIOLOGY REPORT*  Clinical Data: Short of breath  PORTABLE CHEST - 1 VIEW  Comparison: 06/25/2012  Findings: Prominent cardiomegaly is stable.  Left subclavian AICD device and leads are stable.  Right internal jugular dialysis catheter stable.  Stable vascular congestion.  No pulmonary edema. Tiny pleural effusions stable.  IMPRESSION: Improved edema.  Mild vascular congestion persist.   Original Report Authenticated By: Jolaine Click, M.D.     Scheduled Meds:   .  aspirin EC  81 mg Oral Daily  . carvedilol  6.25 mg Oral BID WC  . cefTAZidime (FORTAZ)  IV  2 g Intravenous Once  . darbepoetin  25 mcg Intravenous Q7 days  . feeding supplement (NEPRO CARB STEADY)  237 mL Oral BID WC  . heparin  5,000 Units Subcutaneous Q8H  . hydrALAZINE  25 mg Oral Q8H  . isosorbide mononitrate  30 mg Oral Daily  . methadone  10 mg Oral TID  . multivitamin  1 tablet Oral Daily  . pantoprazole  40 mg Oral Daily  . ranolazine  500 mg Oral Daily  . sevelamer carbonate  400 mg Oral TID WC  . sodium chloride  3 mL Intravenous Q12H  . vancomycin  500 mg Intravenous To Hemo  . voriconazole  200 mg Oral BID AC   Continuous Infusions:   Active Problems:  Ischemic cardiomyopathy, EF 20-25% echo April 2013, now 10% by cath (05/06/12)  Chronic systolic congestive heart failure, NYHA class 3  LBBB (left bundle branch  block)  Pancreatitis, chronic  ESRD on hemodialysis  Hyperkalemia    Pamella Pert  [facility] Pager [phone]. If 7PM-7AM, please contact night-coverage at [email or web address], password [facility] 07/13/2012, 12:10 PM  LOS: 1 day

## 2012-07-13 NOTE — Progress Notes (Signed)
Chaplain visited patient who appeared frail and tired. Pt stated she wanted to visit with the chaplain but was too tired to talk.  Pt. requested a follow up visit.  Chaplain will plan on seeing patient tomorrow morning.    Rutherford Nail Chaplain

## 2012-07-13 NOTE — Procedures (Signed)
I was present at this dialysis session. I have reviewed the session itself and made appropriate changes.   Vinson Moselle, MD [employer] 07/13/2012, 1:15 PM

## 2012-07-13 NOTE — Progress Notes (Signed)
From [facility] drawn in April 2013, pt has HX of KTC in the blood (highly resistant gram negative rods). Pt will need to be on [facility] isolation for all future admissions per Infection Prevention. [place], Rosanna Randy

## 2012-07-13 NOTE — Progress Notes (Signed)
Subjective: Alert, on HD  Objective Vital signs in last 24 hours: Filed Vitals:   07/13/12 1155 07/13/12 1225 07/13/12 1230 07/13/12 1300  BP: 132/87 122/83 122/87 122/72  Pulse: 97 97 96 102  Temp: 96.8 F (36 C)     TempSrc: Oral     Resp: 21 22 22 18   Height:      Weight: 50.9 kg (112 lb 3.4 oz)     SpO2: 94%      Weight change:  No intake or output data in the 24 hours ending 07/13/12 1316 Labs: Basic Metabolic Panel:  Lab 07/12/12 1610 07/07/12 0801  NA 133* 132*  K 6.3* 4.2  CL 103 95*  CO2 -- 27  GLUCOSE 86 76  BUN 103* 35*  CREATININE 5.60* 3.13*  ALB -- --  CALCIUM -- 10.9*  PHOS -- --   Liver Function Tests: No results found for this basename: AST:3,ALT:3,ALKPHOS:3,BILITOT:3,PROT:3,ALBUMIN:3 in the last 168 hours  Lab 07/12/12 1341  LIPASE 29  AMYLASE --   No results found for this basename: AMMONIA:3 in the last 168 hours CBC:  Lab 07/12/12 1240 07/12/12 1213 07/07/12 0801  WBC -- 18.7* 11.0*  NEUTROABS -- -- --  HGB 10.2* 9.3* 8.5*  HCT 30.0* 29.6* 27.7*  MCV -- 88.6 90.2  PLT -- 318 180   PT/INR: @labrcntip (inr:5) Cardiac Enzymes: No results found for this basename: CKTOTAL:5,CKMB:5,CKMBINDEX:5,TROPONINI:5 in the last 168 hours CBG:  Lab 07/13/12 0753 07/12/12 2122 07/12/12 2011 07/12/12 1944 07/12/12 1906  GLUCAP 84 112* 95 63* 51*    Iron Studies: No results found for this basename: IRON:30,TIBC:30,TRANSFERRIN:30,FERRITIN:30 in the last 168 hours  Physical Exam:  Blood pressure 122/72, pulse 102, temperature 96.8 F (36 C), temperature source Oral, resp. rate 18, height 5' 4.17" (1.63 m), weight 50.9 kg (112 lb 3.4 oz), SpO2 94.00%.  On dialysis, frail, in pain, fully oriented Throat clear No JVD  Lungs clear bilat  RRR no rub or gallop Mild epig tenderness, no ascites No edema of ext, muscle wasting No asterixis, Ox3, nonfocal R IJ HD cath in chest, old AVG L arm  Outpatient HD: (From last admit 1 wk ago) [place] [facility] On TTS, 4  hrs, Edw= 53.5 > 51 after last admit, heparin , EPO 1500 units q hd, no vit d last pth=178.2 (05/05/12) Bath= 3.0 k, 2.5 Ca, 1.0 mg.  R IJ HD cath   Impression/Plan   1. Abd pain / hx chronic panc, pseudocysts and recurrent pancreatic bed infection  On vanc/fortaz and po voriconazole, same regimen from d/c last admit Enterococcus +cx from panc aspirate 1/14  Doesn't want surgery and prob poor candidate. Will consult palliative care, pt agreeable DNR  2. Anemia of CKD  Hb 10  Darbe 25/wk q Tues   3. ESRD  HD today then tomorrow to get back on schedule Hep 1600u w hd R IJ cath   4. Hyperkalemia  HD today  5. Severe DCM / EF 20% / ICD   6. FTT / cachexia / chronic pain  Takes methadone   7. HTN/volume  No vol excess / on coreg and hydralazine BP normal At dry weight 51 kg - attempt 2 kg off today   8. MBD-CKD  PTH 178 Nov 2013  No vit d / cont Renvela   9. HD access L arm AVGG placed 10/25 by Dr Hollace Hayward at [facility] ligated 06/20/12 due to LUE edema from L SCV stenosis and L-sided pacemaker. HD via R IJ cath,  had appt 1/29 for new access w Dr Hollace Hayward but will have missed that     Vinson Moselle  MD [facility] [phone] pgr    [phone] cell 07/13/2012, 1:16 PM

## 2012-07-13 NOTE — Progress Notes (Signed)
Palliative Medicine Team consult requested by Dr Arta Silence; patient seen at bedside complaining of abdominal pain -rated 9/10; pain medication orders currently in place and [place], staff RN Toma Copier in process of administering pain med; patient stated she remembers speaking with PMT provider in December and agreeable to meeting again; per patient  family is 'not in town' and she requested meeting without any other family members present- patient stated "I make my own decisions' PMT provider will see patient tomorrow, Thursday 07/14/12 @ 11-11:30 am   Valente David, RN 07/13/2012, 5:43 PM Palliative Medicine Team RN Liaison [phone]

## 2012-07-13 NOTE — Progress Notes (Signed)
ANTIBIOTIC CONSULT NOTE - FOLLOW UP  Pharmacy Consult for Vancomycin and Fortaz Indication: infected pancreatic pseudocyst/abscess  Patient Measurements: Height: 5' 4.17" (163 cm) Weight: 113 lb 12.8 oz (51.619 kg) IBW/kg (Calculated) : 55.1   Vital Signs: Temp: 98 F (36.7 C) (01/29 0740) Temp src: Oral (01/29 0740) BP: 120/80 mmHg (01/29 0740) Pulse Rate: 99  (01/29 0740)  Labs:  Basename 07/12/12 1240 07/12/12 1213  WBC -- 18.7*  HGB 10.2* 9.3*  PLT -- 318  LABCREA -- --  CREATININE 5.60* --   Microbiology:  1/14 - abscess culture - few Enterococcus and few yeast   1/28 - urine culture - pending.  Assessment:   Day #15/28 antibiotics.  Last admit 1/11-1/23/13.   Usual TTS dialysis, but Tues session deferred until today per patient request.     Received Ceftriaxone 1gram IV x 1 in ED 1/28.   Continues on Voriconizole 200 mg PO BID.  Goal of Therapy:   pre-HD vancomycin levels 15-25 mcg/ml  Plan:   Usual doses of Vancomycin 500 mg IV and Fortaz 2 grams IV after dialysis today.  Will follow-up for further HD orders to schedule subsequent doses.  Dennie Fetters, RPh Pager: [phone] 07/13/2012,11:33 AM

## 2012-07-14 ENCOUNTER — Inpatient Hospital Stay (HOSPITAL_COMMUNITY): Payer: Non-veteran care

## 2012-07-14 DIAGNOSIS — K759 Inflammatory liver disease, unspecified: Secondary | ICD-10-CM

## 2012-07-14 DIAGNOSIS — N039 Chronic nephritic syndrome with unspecified morphologic changes: Secondary | ICD-10-CM

## 2012-07-14 DIAGNOSIS — R109 Unspecified abdominal pain: Secondary | ICD-10-CM | POA: Diagnosis present

## 2012-07-14 DIAGNOSIS — R627 Adult failure to thrive: Secondary | ICD-10-CM | POA: Diagnosis present

## 2012-07-14 DIAGNOSIS — R52 Pain, unspecified: Secondary | ICD-10-CM

## 2012-07-14 DIAGNOSIS — Z515 Encounter for palliative care: Secondary | ICD-10-CM

## 2012-07-14 DIAGNOSIS — K859 Acute pancreatitis without necrosis or infection, unspecified: Secondary | ICD-10-CM

## 2012-07-14 DIAGNOSIS — N189 Chronic kidney disease, unspecified: Secondary | ICD-10-CM

## 2012-07-14 DIAGNOSIS — K5900 Constipation, unspecified: Secondary | ICD-10-CM | POA: Diagnosis present

## 2012-07-14 LAB — CBC
HCT: 28.7 % — ABNORMAL LOW (ref 36.0–46.0)
Hemoglobin: 9 g/dL — ABNORMAL LOW (ref 12.0–15.0)
MCH: 27.4 pg (ref 26.0–34.0)
MCHC: 31.4 g/dL (ref 30.0–36.0)

## 2012-07-14 LAB — GLUCOSE, CAPILLARY
Glucose-Capillary: 82 mg/dL (ref 70–99)
Glucose-Capillary: 126 mg/dL — ABNORMAL HIGH (ref 70–99)
Glucose-Capillary: 109 mg/dL — ABNORMAL HIGH (ref 70–99)

## 2012-07-14 LAB — COMPREHENSIVE METABOLIC PANEL
ALT: 358 U/L — ABNORMAL HIGH (ref 0–35)
AST: 945 U/L — ABNORMAL HIGH (ref 0–37)
Albumin: 1.6 g/dL — ABNORMAL LOW (ref 3.5–5.2)
Alkaline Phosphatase: 237 U/L — ABNORMAL HIGH (ref 39–117)
Chloride: 99 mEq/L (ref 96–112)
Potassium: 4.2 mEq/L (ref 3.5–5.1)
Total Bilirubin: 0.4 mg/dL (ref 0.3–1.2)

## 2012-07-14 MED ORDER — VANCOMYCIN HCL 500 MG IV SOLR
500.0000 mg | INTRAVENOUS | Status: AC
  Administered 2012-07-14: 500 mg via INTRAVENOUS
  Filled 2012-07-14: qty 500

## 2012-07-14 MED ORDER — DEXTROSE 5 % IV SOLN
2.0000 g | INTRAVENOUS | Status: DC
  Administered 2012-07-14: 2 g via INTRAVENOUS
  Filled 2012-07-14 (×2): qty 2

## 2012-07-14 MED ORDER — SODIUM CHLORIDE 0.9 % IV SOLN
100.0000 mg | INTRAVENOUS | Status: DC
  Administered 2012-07-14 – 2012-07-19 (×6): 100 mg via INTRAVENOUS
  Filled 2012-07-14 (×9): qty 100

## 2012-07-14 MED ORDER — SENNOSIDES-DOCUSATE SODIUM 8.6-50 MG PO TABS
1.0000 | ORAL_TABLET | Freq: Two times a day (BID) | ORAL | Status: DC
  Administered 2012-07-14 – 2012-07-20 (×12): 1 via ORAL
  Filled 2012-07-14 (×13): qty 1

## 2012-07-14 MED ORDER — DARBEPOETIN ALFA-POLYSORBATE 25 MCG/0.42ML IJ SOLN: 25.0000 ug | INTRAMUSCULAR | Status: DC

## 2012-07-14 MED ORDER — POLYETHYLENE GLYCOL 3350 17 G PO PACK
17.0000 g | PACK | Freq: Every day | ORAL | Status: DC | PRN
Start: 2012-07-14 — End: 2012-07-20
  Filled 2012-07-14: qty 1

## 2012-07-14 NOTE — Progress Notes (Signed)
Met with patient as per RN's recommendation. Provided patient with information regarding counseling services. Patient indicated that she would like to receive counseling services during her stay at the hospital. Patient indicated that she would like to see the counselor three times a week. Patient signed informed consent forms. Patient discussed her goals for counseling and the benefits of counseling. Patient discussed the possibility of her children visiting her.  Sherol Dade Counseling Intern Haroldine Laws

## 2012-07-14 NOTE — Progress Notes (Addendum)
INITIAL NUTRITION ASSESSMENT  DOCUMENTATION CODES Per approved criteria  -Severe malnutrition in the context of chronic illness   INTERVENTION: 1. Continue Nepro po BID 2. Consider diet liberalization to promote weight gain and po variety 3. RD to continue to follow nutrition care plan  NUTRITION DIAGNOSIS: Inadequate oral intake related to abdominal pain 2/2 chronic pancreatitis as evidenced by ongoing weight loss.   Goal: Consume 50% of meals and supplements  Monitor:  Weights, labs, PO intake, I/O's  Reason for Assessment: Malnutrition Screening Tool  62 y.o. female  Admitting Dx: SOB and generalized weakness  ASSESSMENT: Admitted with SOB and weakness. Pt did not go to her scheduled HD on Tuesday, rather she came to the hospital for further evaluation. She was found to have potassium of 6.3 and is clinically volume overloaded.  Palliative care consulted, full note not yet available at this time. PMT is to try to arrange a phone call to [facility] in [place] soon.  Pt with hx of frequent hospitalizations. Weight continues to decline. Pt reports that her intake has been inadequate since she last left the hospital. Pt reports her weight has been fairly stable, however per EPIC weights, pt with 18.5% wt loss x 3 months, this is significant.  Nutrition Focused Physical Exam: Subcutaneous Fat:  Orbital Region: nourished Upper Arm Region: severe Thoracic and Lumbar Region: n/a  Muscle:  Temple Region: severe Clavicle Bone Region: severe Clavicle and Acromion Bone Region: moderate Scapular Bone Region: n/a Dorsal Hand: n/a Patellar Region: moderate Anterior Thigh Region: moderate Posterior Calf Region: moderate  Edema: moderate  Pt meets criteria for severe MALNUTRITION in the context of chronic illness as evidenced by wt change of 12% x 3 months and severe muscle mass and fat mass loss. Also noted to have moderate edema in lower extremities.  Height: Ht Readings from  Last 1 Encounters:  07/13/12 5\' 4"  (1.626 m)    Weight: Wt Readings from Last 1 Encounters:  07/14/12 108 lb 14.5 oz (49.4 kg)    Ideal Body Weight: 120 lb/54.5 kg  % Ideal Body Weight: 97%  Wt Readings from Last 20 Encounters:  07/14/12 108 lb 14.5 oz (49.4 kg)  07/07/12 113 lb 12.1 oz (51.6 kg)  06/03/12 124 lb 1.9 oz (56.3 kg)  05/27/12 128 lb (58.06 kg)  05/11/12 119 lb 4.3 oz (54.1 kg)  05/11/12 119 lb 4.3 oz (54.1 kg)  04/23/12 119 lb 14.9 oz (54.4 kg)  04/04/12 117 lb 15.1 oz (53.5 kg)  03/05/12 115 lb 8.3 oz (52.4 kg)  02/24/12 130 lb 4.7 oz (59.1 kg)  10/13/11 135 lb 6.4 oz (61.417 kg)  09/17/11 132 lb 7.9 oz (60.1 kg)  09/17/11 132 lb 7.9 oz (60.1 kg)  06/04/11 157 lb 13.6 oz (71.6 kg)  04/08/11 159 lb 6.4 oz (72.303 kg)  12/01/10 176 lb (79.833 kg)   Usual Body Weight: 130 - 135 lb  % Usual Body Weight: 81.5%; 18.5% wt loss x 3 months  BMI:  Body mass index is 18.69 kg/(m^2). Weight is WNL  Estimated Nutritional Needs: Kcal: 1600 - 1800 kcal Protein: 80 - 90 grams Fluid: 1.2 liters daily  Skin: intact  Diet Order: Renal 80-90; 1200 ml fluid restriction  EDUCATION NEEDS: -No education needs identified at this time   Intake/Output Summary (Last 24 hours) at 07/14/12 1555 Last data filed at 07/14/12 1140  Gross per 24 hour  Intake    480 ml  Output   -186 ml  Net  666 ml    Last BM: 1/11  Labs:   Lab 07/14/12 0650 07/13/12 1344 07/12/12 1240  NA 138 141 133*  K 4.2 3.5 6.3*  CL 99 100 103  CO2 27 26 --  BUN 52* 50* 103*  CREATININE 3.46* 2.96* 5.60*  CALCIUM 9.8 9.5 --  MG -- -- --  PHOS -- 5.0* --  GLUCOSE 75 105* 86    CBG (last 3)   Basename 07/14/12 0734 07/13/12 2036 07/13/12 1659  GLUCAP 82 109* 83    Scheduled Meds:    . aspirin EC  81 mg Oral Daily  . carvedilol  6.25 mg Oral BID WC  . cefTAZidime (FORTAZ)  IV  2 g Intravenous Q T,Th,Sat-1800  . darbepoetin  25 mcg Intravenous Q Tue-HD  . feeding supplement  (NEPRO CARB STEADY)  237 mL Oral BID WC  . heparin  5,000 Units Subcutaneous Q8H  . hydrALAZINE  25 mg Oral Q8H  . isosorbide mononitrate  30 mg Oral Daily  . methadone  10 mg Oral TID  . micafungin (MYCAMINE) IV  100 mg Intravenous Q24H  . multivitamin  1 tablet Oral Daily  . pantoprazole  40 mg Oral Daily  . ranolazine  500 mg Oral Daily  . sevelamer carbonate  400 mg Oral TID WC  . sodium chloride  3 mL Intravenous Q12H    Continuous Infusions:   Past Medical History  Diagnosis Date  . Coronary artery disease     s/p CABG 2008 with multiple PCIs  . Ischemic cardiomyopathy     Cath 04/2012, significant calcifications  . History of nonadherence to medical treatment   . Systolic CHF     16/1096 echo EF 10%  . Esophageal varices   . Cirrhosis     hx of fatty liver per patient, has known ascites with repeated paracenteses at [facility] as of 2013, also +hx of esoph varices with banding in the past  . Pacemaker   . ICD (implantable cardiac defibrillator) in place   . Hypertension     "used to have HTN; now I'm low" (05/23/2012)  . NSTEMI (non-ST elevated myocardial infarction)     04/2012; Trop peaked to 1.39; 05/23/2012 pt denies ever having MI  . Pneumonia 02/2012    "first time ever" (05/23/2012)  . Chronic bronchitis 1970's thru 2002    "went away when I stopped smoking in 2002" (05/23/2012)  . Diabetes mellitus     Diet controlled  . History of blood transfusion     "alot of them" (05/23/2012)  . Iron (Fe) deficiency anemia     "severe" (05/23/2012)  . Pancreatitis, chronic 10/04/2011    Had severe gallstone pancreatitis in May 2003, no surgery, treated medically then returned in June 2003 for cholecystectomy and cyst gastrostomy for pancreatic pseudocyst. In March 2013 was admitted for hemorrhage into psueodcyst. In April 2013 was treated at [facility] for polymicrobial bacteremia (fungal, MRSA and enterobacter) felt to be due to ruptured pseudocyst, treated medically. Admitted Jan 2014  with abd pain and had gas and fluid in pancreatic bed. She refused surgery ("would not make it") and had fluid aspirated by IR- gram stain showed yeast and cultures grew enterococcus species, sensitive to amp and vanc. She was seen by ID and discharged on IV vanc/fortaz with HD and po voriconazole.     [name] ESRD on hemodialysis 05/06/2012    T,Th,Sat HD by [employer] on [address]. Started hemodialysis around July 2013.    [name]  CHF (congestive heart failure)   . Complication of vascular access for dialysis 07/13/2012    L arm AVG (placed by [facility] Lyda Perone [facility]) was ligated 1/6 after significant L arm swelling secondary to L SCV stenosis and pacemaker; new access to be placed at right arm on 1/29 by Dr. Hollace Hayward      Past Surgical History  Procedure Date  . Esophagogastroduodenoscopy 09/15/2011    Procedure: ESOPHAGOGASTRODUODENOSCOPY (EGD);  Surgeon: Theda Belfast, MD;  Location: [facility] ENDOSCOPY;  Service: Endoscopy;  Laterality: N/A;  . Cholecystectomy 2003  . Tonsillectomy and adenoidectomy 1961  . Appendectomy 1973?  [name] Vaginal hysterectomy 1973?  [name] Tubal ligation 1972  . Dilation and curettage of uterus     "bunch from profuse bleeding in the 1970's" (05/23/2012)  . Coronary artery bypass graft 2008    CABG X4  . Coronary angioplasty with stent placement 2008    "1; day after CABG" (05/23/2012)  . Cardiac catheterization     "before 2008 and 3 wk ago" (05/23/2012)  . Insert / replace / remove pacemaker 06/2011    pacemaker ICD  . Cardiac defibrillator placement 06/2011  . Arteriovenous graft placement 03/2012    right antecub  . Insertion of dialysis catheter ~ 10/2011    right chest  . Peritoneal catheter insertion 08/2011  . Peritoneal catheter removal ~ 10/2011    Jarold Motto MS, RD, LDN Pager: [phone] After-hours pager: [phone]

## 2012-07-14 NOTE — Progress Notes (Signed)
Dialysis initiated via R IJ permcath using aeseptic technique. Catheter dressing changed yesterday. No dsg change done today.  Outpatient plan of care requested

## 2012-07-14 NOTE — Progress Notes (Signed)
OT Cancellation Note  Patient Details Name: [name] MRN: [id number] DOB: [birth year]   Cancelled Treatment:    Reason Eval/Treat Not Completed: Fatigue/lethargy limiting ability to participate (Dialysis x 2. Will assess tomorrow)  Burnett Corrente Hutchinson Isenberg, OTR/L  [phone] 07/14/2012 07/14/2012, 6:03 PM

## 2012-07-14 NOTE — Procedures (Signed)
I was present at this dialysis session. I have reviewed the session itself and made appropriate changes.   Vinson Moselle, MD [employer] 07/14/2012, 11:02 AM

## 2012-07-14 NOTE — Progress Notes (Addendum)
INFECTIOUS DISEASE PROGRESS NOTE  ID: [name] is a 62 y.o. 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 She was hospitalized 1-11 to 1-23 for worsening abd pain. She underwent IR aspirate of her L retroperitoneal fluid collection on 1-14 and the Cx of this grew Enterococcus (pan-sens and yeast, sens pending). She was treated with vanco/ceftaz as well as mycafungin while in hospital. She was d/c home and given a Rx for voriconazole to replace this.  At home she states that she felt well on [birth year] (her birthday) but afterwards felt poorly- she had increasing weakness, inability to do her ADLs and DOE. No fever or chills.  Has had NL BM.  On admission she was afebrile, felt to be volume overloaded, found to have K+ 6.3. She was found on LFTs today to have AST 954 abd ALT 358.   Abtx:  Anti-infectives     Start     Dose/Rate Route Frequency Ordered Stop   07/14/12 2000   micafungin (MYCAMINE) 100 mg in sodium chloride 0.9 % 100 mL IVPB        100 mg 100 mL/hr over 1 Hours Intravenous Every 24 hours 07/14/12 1007     07/14/12 1800   cefTAZidime (FORTAZ) 2 g in dextrose 5 % 50 mL IVPB        2 g 100 mL/hr over 30 Minutes Intravenous Every T-Th-Sa (1800) 07/14/12 0952     07/14/12 1200   vancomycin (VANCOCIN) 500 mg in sodium chloride 0.9 % 100 mL IVPB        500 mg 100 mL/hr over 60 Minutes Intravenous Every Thu (Hemodialysis) 07/14/12 0909 07/14/12 1153   07/13/12 1800   cefTAZidime (FORTAZ) 2 g in dextrose 5 % 50 mL IVPB        2 g 100 mL/hr over 30 Minutes Intravenous  Once 07/13/12 1132 07/13/12 1857   07/13/12 1630   voriconazole (VFEND) tablet 200 mg  Status:  Discontinued        200 mg Oral 2 times daily before meals 07/13/12 1124 07/14/12 1006   07/13/12 1600   vancomycin (VANCOCIN) 500 mg in sodium chloride 0.9 % 100 mL IVPB        500 mg 100 mL/hr over 60 Minutes Intravenous To Hemodialysis 07/13/12 1132 07/13/12 1518   07/13/12 0959   voriconazole (VFEND) tablet 200 mg  Status:  Discontinued        200 mg Oral 2 times daily 07/13/12 0959 07/13/12 1124   07/12/12 1545   cefTRIAXone (ROCEPHIN) 1 g in dextrose 5 % 50 mL IVPB        1 g 100 mL/hr over 30 Minutes Intravenous  Once 07/12/12 1534 07/12/12 1643          Medications:  Scheduled:   . aspirin EC  81 mg Oral Daily  . carvedilol  6.25 mg Oral BID WC  . cefTAZidime (FORTAZ)  IV  2 g Intravenous Q T,Th,Sat-1800  . darbepoetin  25 mcg Intravenous Q Tue-HD  . feeding supplement (NEPRO CARB STEADY)  237 mL Oral BID WC  . heparin  5,000 Units Subcutaneous Q8H  .  hydrALAZINE  25 mg Oral Q8H  . isosorbide mononitrate  30 mg Oral Daily  . methadone  10 mg Oral TID  . micafungin (MYCAMINE) IV  100 mg Intravenous Q24H  . multivitamin  1 tablet Oral Daily  . pantoprazole  40 mg Oral Daily  . ranolazine  500 mg Oral Daily  . sevelamer carbonate  400 mg Oral TID WC  . sodium chloride  3 mL Intravenous Q12H    Objective: Vital signs in last 24 hours: Temp:  [96.9 F (36.1 C)-98 F (36.7 C)] 97.8 F (36.6 C) (01/30 1330) Pulse Rate:  [62-85] 77  (01/30 1330) Resp:  [12-18] 18  (01/30 1330) BP: (83-117)/(53-75) 93/63 mmHg (01/30 1330) SpO2:  [96 %-98 %] 96 % (01/30 1330) Weight:  [48.399 kg (106 lb 11.2 oz)-49.4 kg (108 lb 14.5 oz)] 49.4 kg (108 lb 14.5 oz) (01/30 1140)   General appearance: alert, cooperative and mild distress Eyes: negative findings: pupils equal, round, reactive to light and accomodation Throat: normal findings: oropharynx pink & moist without lesions or evidence of thrush Resp: clear to  auscultation bilaterally Cardio: regular rate and rhythm and systolic murmur: systolic ejection 3/6, crescendo at 2nd right intercostal space GI: abnormal findings:  distended, hypoactive bowel sounds and diffusely tender. Extremities: edema none  Lab Results  Basename 07/14/12 0650 07/13/12 1344  WBC 6.5 14.0*  HGB 9.0* 10.5*  HCT 28.7* 31.3*  NA 138 141  K 4.2 3.5  CL 99 100  CO2 27 26  BUN 52* 50*  CREATININE 3.46* 2.96*  GLU -- --   Liver Panel  Basename 07/14/12 0650 07/13/12 1344  PROT 6.4 --  ALBUMIN 1.6* 2.0*  AST 945* --  ALT 358* --  ALKPHOS 237* --  BILITOT 0.4 --  BILIDIR -- --  IBILI -- --   Sedimentation Rate No results found for this basename: ESRSEDRATE in the last 72 hours C-Reactive Protein No results found for this basename: CRP:2 in the last 72 hours  Microbiology: Recent Results (from the past 240 hour(s))  URINE CULTURE     Status: Normal   Collection Time   07/12/12  1:32 PM      Component Value Range Status Comment   Specimen Description URINE, CATHETERIZED   Final    Special Requests NONE   Final    Culture  Setup Time 07/12/2012 14:21   Final    Colony Count NO GROWTH   Final    Culture NO GROWTH   Final    Report Status 07/13/2012 FINAL   Final     Studies/Results: No results found.   Assessment/Plan: Hepatitis Pancreatic Abscess (prev enterococcus, yeast)  Would stop ceftaz Watch her LFTs  Continue mycafungin (if LFTs increase would stop ) Await fungal sensitivity, ID Is she going to have repeat paracentesis? Consider checking ammonia? Comment- she is being eval by paliative care.  Total days of antibiotics- 16  Johny Sax Infectious Diseases [phone] 07/14/2012, 4:58 PM   LOS: 2 days

## 2012-07-14 NOTE — Progress Notes (Signed)
[facility]  Patient Status: Active (receiving services up to time of hospitalization)  [facility] is providing the following services: RN, PT, OT and HHA  If patient discharges after hours, please call [phone].   [name] 07/14/2012, 1:08 PM

## 2012-07-14 NOTE — Progress Notes (Signed)
[facility] PROGRESS NOTE  [name]:[id number] DOB: 18-May-1951 DOA: 07/12/2012 PCP: Jyl Heinz, MD  Assessment/Plan:  Shortness of breath  - Although she looks clinically overloaded, chest x-ray and clinical examination does not show pulmonary edema.  - She is getting progressively worse overall.  - No evidence of pneumonia, pulmonary edema per chest x-ray.  - s/p HD today, breathing stable  Transaminitis - may be related to her Voriconazole vs congestive hepatopathy 2/2 end stage heart failure vs liver pathology - will d/c that and switch to Micafungin. ID consulted, appreciate further input.   Infected hemorrhagic pancreatic pseudocyst  - Status post aspiration on 06/28/2012, aspirate grew enterococcus and yeast.  - Patient is on Fortaz, vancomycin and voriconazole at home.  - This is being chronic problem, seen by general surgery earlier this month and she refused surgical excision.  - ID consulted today,  ESRD  - On dialysis on Tuesday, Thursday and Saturday.  - nephrology following, appreciate their input  Hyperkalemia  - HD today  Chronic systolic CHF, severe  -With LVEF of 10% on 05/08/2012 by 2-D echocardiogram.  -Has Lorcet edema and orthopnea, no significant pulmonary edema or pleural effusion.  -She is on Coreg, Imdur and hydralazine.  -She has ICD in place, she has LBBB,  Chronic abdominal pain  -She is on methadone and oral Dilaudid. Chronic abdominal pain secondary to chronic pancreatic bed problems.  -Had long standing history of chronic pancreatitis and infected pancreatic pseudocyst   Disposition  -Patient said she is very weak, to the point she couldn't walk.  -I asked PT/OT to evaluate her, she is likely to need an SNF placement.   Code Status: DNR/DNI Family Communication: none (indicate person spoken with, relationship, and if by phone, the number) Disposition Plan: pending PT and further discussions with  palliative.  Consultants:  Nephrology  ID  Procedures:  none  Antibiotics (started last hospitalization ~06/29/12 with plans for a month)  Vancomycin  Ceftazidime  Voriconazole stopped 1/30  Micafungin 1/30 >>  HPI/Subjective: Endorses feeling tired and weak.  Objective: Filed Vitals:   07/14/12 1040 07/14/12 1110 07/14/12 1140 07/14/12 1153  BP: 83/54 87/58 87/53  117/75  Pulse: 64 64 62 85  Temp:   97 F (36.1 C)   TempSrc:   Axillary   Resp: 12 12 17    Height:      Weight:   49.4 kg (108 lb 14.5 oz)   SpO2:   98%     Intake/Output Summary (Last 24 hours) at 07/14/12 1341 Last data filed at 07/14/12 1140  Gross per 24 hour  Intake    480 ml  Output   1714 ml  Net  -1234 ml   Filed Weights   07/13/12 2033 07/14/12 0800 07/14/12 1140  Weight: 48.399 kg (106 lb 11.2 oz) 49.4 kg (108 lb 14.5 oz) 49.4 kg (108 lb 14.5 oz)    Exam:   General:  Frail appearing AA woman  Cardiovascular: RRR  Respiratory: CTA biL, no crackles  Abdomen: soft, NTTP  MSK - trace edema  Data Reviewed: Basic Metabolic Panel:  Lab 07/14/12 8119 07/13/12 1344 07/12/12 1240  NA 138 141 133*  K 4.2 3.5 6.3*  CL 99 100 103  CO2 27 26 --  GLUCOSE 75 105* 86  BUN 52* 50* 103*  CREATININE 3.46* 2.96* 5.60*  CALCIUM 9.8 9.5 --  MG -- -- --  PHOS -- 5.0* --    Lab 07/12/12 1341  LIPASE 29  AMYLASE --   CBC:  Lab 07/14/12 0650 07/13/12 1344 07/12/12 1240 07/12/12 1213  WBC 6.5 14.0* -- 18.7*  NEUTROABS -- -- -- --  HGB 9.0* 10.5* 10.2* 9.3*  HCT 28.7* 31.3* 30.0* 29.6*  MCV 87.2 86.2 -- 88.6  PLT 168 251 -- 318   BNP (last 3 results)  Basename 06/02/12 1944 05/08/12 0515 04/21/12 1419  PROBNP >70000.0* >70000.0* >70000.0*   CBG:  Lab 07/14/12 0734 07/13/12 2036 07/13/12 1659 07/13/12 0753 07/12/12 2122  GLUCAP 82 109* 83 84 112*    Recent Results (from the past 240 hour(s))  URINE CULTURE     Status: Normal   Collection Time   07/12/12  1:32 PM       Component Value Range Status Comment   Specimen Description URINE, CATHETERIZED   Final    Special Requests NONE   Final    Culture  Setup Time 07/12/2012 14:21   Final    Colony Count NO GROWTH   Final    Culture NO GROWTH   Final    Report Status 07/13/2012 FINAL   Final      Studies: Ct Abdomen Pelvis Wo Contrast  07/12/2012  *RADIOLOGY REPORT*  Clinical Data: Shortness of breath, intermittent abdominal pain and nausea.  Recent history of necrotizing pancreatitis.  CT ABDOMEN AND PELVIS WITHOUT CONTRAST  Technique:  Multidetector CT imaging of the abdomen and pelvis was performed following the standard protocol without intravenous contrast.  Comparison: None.  Findings: The patient declined oral contrast.  Streak artifact from defibrillator leads noted.  Marked enlargement of the heart again noted.  Trace right pleural effusion with associated compressive atelectasis noted.  This is stable compared to the prior exam. Left basilar dependent atelectasis again noted.  There has been slight decrease in abdominal ascites since previously. Cholecystectomy clips noted.  The pancreas is ill- defined with a more focal area of hypodensity in the region of the tail which could represent an abscess or other fluid collection, for example image 26.  Scattered foci of peritoneal gas are again noted.  Subjectively, this is minimally decreased since previously. Extensive vascular calcification is noted.  The lack of contrast, vascular patency cannot be assessed to determine interval change in previously seen portal vein thrombosis.  There is diffuse mild prominence of the ascending colon and proximal transverse colon, for example image 40, but this is difficult to separate from surrounding fluid.  No pneumatosis is identified. This finding appears new since prior exam.  Large pelvic ascites is noted.  Uterus and ovaries not visualized. Bladder is decompressed.  It is not well visualized separate from large pelvic  ascites, but is visualized on coronal and sagittal re- formations, for example coronal image 35 and sagittal image 78. Large amount of stool noted within the rectum.  Anasarca/third spacing noted.  Due to lack of IV contrast, this significantly decreases conspicuity of detail within the solid and hollow viscera.  IMPRESSION: Interval development of ascending colonic and proximal transverse colon wall thickening, which differential considerations include ischemia, infectious/inflammatory colitis, or less likely edema related to anasarca/third spacing.  Slight decrease in ascites.  Slight decrease in peripancreatic gas.  Persistent hypodensity in the tail of the pancreas which could represent a pseudocyst or peripancreatic abscess.  Stable right pleural effusion and bibasilar atelectasis.  Suboptimal visualization due to lack of oral and IV contrast.   Original Report Authenticated By: Christiana Pellant, M.D.     Scheduled Meds:    . aspirin EC  81 mg Oral Daily  . carvedilol  6.25 mg Oral BID WC  . cefTAZidime (FORTAZ)  IV  2 g Intravenous Q T,Th,Sat-1800  . darbepoetin  25 mcg Intravenous Q Tue-HD  . feeding supplement (NEPRO CARB STEADY)  237 mL Oral BID WC  . heparin  5,000 Units Subcutaneous Q8H  . hydrALAZINE  25 mg Oral Q8H  . isosorbide mononitrate  30 mg Oral Daily  . methadone  10 mg Oral TID  . micafungin (MYCAMINE) IV  100 mg Intravenous Q24H  . multivitamin  1 tablet Oral Daily  . pantoprazole  40 mg Oral Daily  . ranolazine  500 mg Oral Daily  . sevelamer carbonate  400 mg Oral TID WC  . sodium chloride  3 mL Intravenous Q12H   Continuous Infusions:   Active Problems:  Ischemic cardiomyopathy, EF 20-25% echo April 2013, now 10% by cath (05/06/12)  Chronic systolic congestive heart failure, NYHA class 3  LBBB (left bundle branch block)  Pancreatitis, chronic  ESRD on hemodialysis  Hyperkalemia  Complication of vascular access for dialysis    Pamella Pert  [facility] Pager [phone]. If 7PM-7AM, please contact night-coverage at [email or web address], password [facility] 07/14/2012, 1:41 PM  LOS: 2 days

## 2012-07-14 NOTE — Clinical Social Work Placement (Addendum)
Clinical Social Work Department CLINICAL SOCIAL WORK PLACEMENT NOTE 07/14/2012  Patient:  [name], [name]  Account Number:  [id number] Admit date:  07/12/2012  Clinical Social Worker:  Genelle Bal, LCSW  Date/time:  07/14/2012 04:00 PM  Clinical Social Work is seeking post-discharge placement for this patient at the following level of care:   SKILLED NURSING   (*CSW will update this form in Epic as items are completed)   07/14/2012  Patient/family provided with [facility] Department of Clinical Social Work's list of facilities offering this level of care within the geographic area requested by the patient (or if unable, by the patient's family).  07/14/2012  Patient/family informed of their freedom to choose among providers that offer the needed level of care, that participate in Medicare, Medicaid or managed care program needed by the patient, have an available bed and are willing to accept the patient.    Patient/family informed of [facility]' ownership interest in [facility], as well as of the fact that they are under no obligation to receive care at this facility.  PASARR submitted to EDS on 07/15/12 PASARR number received from EDS on 07/15/12  FL2 transmitted to all facilities in geographic area requested by pt/family on 07/15/12 FL2 transmitted to all facilities within larger geographic area on   Patient informed that his/her managed care company has contracts with or will negotiate with  certain facilities, including the following:     Patient/family informed of bed offers received: 07/19/12  Patient chooses bed at [facility] Physician recommends and patient chooses bed at    Patient to be transferred to [facility] on 07/20/12   Patient to be transferred to facility by ambulance  The following physician request were entered in Epic:   Additional Comments:  Fernande Boyden, Social Work Intern 07/14/2012

## 2012-07-14 NOTE — Clinical Social Work Psychosocial (Addendum)
Clinical Social Work Department BRIEF PSYCHOSOCIAL ASSESSMENT 07/14/2012  Patient:  [name], [name]     Account Number:  [id number]     Admit date:  07/12/2012  Clinical Social Worker:  Delmer Islam  Date/Time:  07/14/2012 03:43 PM  Referred by:  Physician  Date Referred:  07/13/2012 Referred for  SNF Placement   Other Referral:   Interview type:  Patient Other interview type:    PSYCHOSOCIAL DATA Living Status:  ALONE Admitted from facility:   Level of care:   Primary support name:  Sabino Dick Primary support relationship to patient:  CHILD, ADULT Degree of support available:   Daughter will be visiting patient soon.    CURRENT CONCERNS Current Concerns  Post-Acute Placement   Other Concerns:    SOCIAL WORK ASSESSMENT / PLAN CSW intern introduced self and acknowledged the patient. Patient was alert and oriented. Patient engaged well in conversation with CSW intern. CSW intern received a consult from physician about SNF placement for the patient. CSW intern discussed discharge plans with the patient. Patient is agreeable to short term rehab. CSW intern provided the patient with a list of skilled nursing facilities in [place]. Patient informed CSW intern that she needs to discuss discharge plans with her daughter Sabino Dick) before making any decisions. CSW intern informed patient that CSW will send out her clinicals to the facilities in [facility]. CSW intern will then follow up with the patient about facility responses.   Assessment/plan status:  Psychosocial Support/Ongoing Assessment of Needs Other assessment/ plan:   CSW intern will follow up to provide the patient with facility responses.   Information/referral to community resources:   CSW intern provided the patient with a list of skilled nursing facilities in [facility] as well as, CSW contact information.    PATIENT'S/FAMILY'S RESPONSE TO PLAN OF CARE: Patient expressed to  CSW intern the importance of her decision, and how grateful she is of the services provided by CSW intern.      Fernande Boyden, Social Work Intern 07/14/2012 Genelle Bal, LCSW 07/14/12 - 4:33 pm

## 2012-07-14 NOTE — Progress Notes (Signed)
UF cut off per VO Dr. Arlean Hopping (pt. below EDW)

## 2012-07-14 NOTE — Progress Notes (Signed)
PT Cancellation Note  Patient Details Name: DENETTA FEI MRN: [id number] DOB: March 25, 1951   Cancelled Treatment:    Reason Eval/Treat Not Completed: Fatigue/lethargy limiting ability to participate.  Reports too weak to try this pm esp after dialysis 2 days in a row.  Will try in am.   Charrise Lardner,CYNDI 07/14/2012, 4:19 PM

## 2012-07-14 NOTE — Consult Note (Signed)
Patient ZO:XWRUEAV MCKENZYE [name]      DOB: 1951/04/13      WUJ:[id number]     Consult Note from the Palliative Medicine Team at [facility]    Consult Requested by: Dr Arta Silence     PCP: Jyl Heinz, MD Reason for Consultation:Goals of Care     Phone Number:[phone]  Assessment of patients Current state: Patient is known to PMT service from 05/2012, she is notable weaker, with substaintial weight loss (weight 12/13: 131lbs, current weight 108 lbs). Patient lives alone. Per neighbor/friend patient refers to as her daughter [name] [phone]) patient has been doing poorly mentally and physically  spending majority of time in bed, eats very little, diet mainly consists of supplemental drinks. She informed me that patient only goes to outpatient dialysis 2/3 days per week, due to her generalized weakened state. [name] verbalized concerns of patient returning home alone.  Had lengthy discussion with patient, her mental status, oriented to place, person, and time. She realizes that she has gotten substantially weaker, and verbalized that she would consider placement, since going home alone would not be a safe option. We talked about the option of stopping HD, since she has had an obvious physical decline and is unable to go to all sessions required as an outpatient. We discussed the burdens that medical treatments can pose and the impact trweatments can have on a persons quality of life. She understands that if she stops HD that she will die and she stated she is not ready for that. I ask her permission to have her son [name] (c# [phone]) from [place] who supposedly has HPOA, and [name], her daughter, (c# [phone]) involved in a palliative care discussion regarding her current state of health. She was agreeable to this and this wil be coordinated tomorrow at 5:30p via phone, her friend [name] will also attend in person.   I than spoke to her about her pain she stated  she was having a throbbing/stabbing pain in localized to the LUQ that is present all the time. Currently rates it as 8/10, at best it gets to 6/10 which is tolerable. During her admission in December she was discharged on a regimen of Methadone 5 mg three times per day with Dilaudid 4 mg every 4 hours , and also Dilaudid 1 mg every 2 hours for BTP. Arrangements were made for f/u with PCP at the [facility] clinic in [place], with recommendations for refferal to [facility] for pain clinic, since PCP would not write prescriptions for Methadone.   Patient stated the above plans were not followed through on, and that she does not have the money to fill prescriptions, so she just gets the Dilaudid and uses it as needed when she is discharged. She currently is on Methadone 10 mg three times a day and Dilaudid IV every 4 hours as needed (12 mg/24 hours used)   Goals of Care: 1.  Code Status:DNR/DNI   2. Scope of Treatment: 1. Vital Signs:routine 2. AICD: has one in place  3. Respiratory/Oxygen: as indicated 4. Nutritional Support/Tube Feeds:does not want 5. Antibiotics:as indicated 6. Blood Products: as indicated 7. IVF: as indicated 8. Review of Medications to be discontinued: none presently 9. Labs: as indicated 10. Telemetry:as indicated   4. Disposition: plans pending at this time SNF with Palliative care services would be recommended, since patient wants to continue HD    3. Symptom Management:   1. Pain:continue Methadone 10 mg three times per day, Dilaudid  IV as needed, this will need to be converted to oral form prior to discharge 2. Bowel Regimen:ordered Senakot twice daily to avoid opioid constipation, with Miralx as needed 3. Fever:Tylenol as needed  4. Psychosocial:Emotional support to patient, education on options of care with declining status  5. Spiritual:consult placed   Patient Documents Completed or Given: Document Given Completed  Advanced Directives Pkt    MOST  Will need  to address during meeting 1/31  DNR  YES  Gone from My Sight    Hard Choices previously     Brief HPI: Patients is a 62 yo AAF with PMH: Gallstone pancreatitis (s/p cholecystectomy 2003), chronic pancreatitis (s/p resection of pancreas), CAD (s/p CABG), ischemic cardiomyopathy (s/p Medtronic/AICD-EF 10% 04/2012), sever global hypokinesis, mild aortic stenosis, moderate to sever mitral valve regurgitation, ESRD (initiated on peritoneal dialysis 11/2 years ago then transitioned to HD 6 mths ago (Tue/Thus/Sat), history also includes DM, NTH, anemia, esophageal varices, fatty liver disease, bacteremia, Candida fungemia, perforated bowel and GI bleed. Patient admitted with SOB and weakness on 07/13/11.  She was recently discharged from the hospital on 07/09/2012, after aspiration of her infected pancreatic pseudocyst culture showed enterococcus and yeast.    ROS: +abdominal pain, +generalized weakness/fatigue, +decreased oral intake    PMH:  Past Medical History  Diagnosis Date  . Coronary artery disease     s/p CABG 2008 with multiple PCIs  . Ischemic cardiomyopathy     Cath 04/2012, significant calcifications  . History of nonadherence to medical treatment   . Systolic CHF     16/1096 echo EF 10%  . Esophageal varices   . Cirrhosis     hx of fatty liver per patient, has known ascites with repeated paracenteses at [facility] as of 2013, also +hx of esoph varices with banding in the past  . Pacemaker   . ICD (implantable cardiac defibrillator) in place   . Hypertension     "used to have HTN; now I'm low" (05/23/2012)  . NSTEMI (non-ST elevated myocardial infarction)     04/2012; Trop peaked to 1.39; 05/23/2012 pt denies ever having MI  . Pneumonia 02/2012    "first time ever" (05/23/2012)  . Chronic bronchitis 1970's thru 2002    "went away when I stopped smoking in 2002" (05/23/2012)  . Diabetes mellitus     Diet controlled  . History of blood transfusion     "alot of them" (05/23/2012)  .  Iron (Fe) deficiency anemia     "severe" (05/23/2012)  . Pancreatitis, chronic 10/04/2011    Had severe gallstone pancreatitis in May 2003, no surgery, treated medically then returned in June 2003 for cholecystectomy and cyst gastrostomy for pancreatic pseudocyst. In March 2013 was admitted for hemorrhage into psueodcyst. In April 2013 was treated at [facility] for polymicrobial bacteremia (fungal, MRSA and enterobacter) felt to be due to ruptured pseudocyst, treated medically. Admitted Jan 2014 with abd pain and had gas and fluid in pancreatic bed. She refused surgery ("would not make it") and had fluid aspirated by IR- gram stain showed yeast and cultures grew enterococcus species, sensitive to amp and vanc. She was seen by ID and discharged on IV vanc/fortaz with HD and po voriconazole.     [name] ESRD on hemodialysis 05/06/2012    T,Th,Sat HD by [facility] on [address]. Started hemodialysis around July 2013.    [name] CHF (congestive heart failure)   . Complication of vascular access for dialysis 07/13/2012    L arm AVG (placed  by [facility] Lyda Perone [facility]) was ligated 1/6 after significant L arm swelling secondary to L SCV stenosis and pacemaker; new access to be placed at right arm on 1/29 by Dr. Hollace Hayward       PSH: Past Surgical History  Procedure Date  . Esophagogastroduodenoscopy 09/15/2011    Procedure: ESOPHAGOGASTRODUODENOSCOPY (EGD);  Surgeon: Theda Belfast, MD;  Location: [facility] ENDOSCOPY;  Service: Endoscopy;  Laterality: N/A;  . Cholecystectomy 2003  . Tonsillectomy and adenoidectomy 1961  . Appendectomy 1973?  [name] Vaginal hysterectomy 1973?  [name] Tubal ligation 1972  . Dilation and curettage of uterus     "bunch from profuse bleeding in the 1970's" (05/23/2012)  . Coronary artery bypass graft 2008    CABG X4  . Coronary angioplasty with stent placement 2008    "1; day after CABG" (05/23/2012)  . Cardiac catheterization     "before 2008 and 3 wk ago" (05/23/2012)  . Insert / replace /  remove pacemaker 06/2011    pacemaker ICD  . Cardiac defibrillator placement 06/2011  . Arteriovenous graft placement 03/2012    right antecub  . Insertion of dialysis catheter ~ 10/2011    right chest  . Peritoneal catheter insertion 08/2011  . Peritoneal catheter removal ~ 10/2011   I have reviewed the FH and SH and  If appropriate update it with new information. Allergies  Allergen Reactions  . Codeine Itching  . Morphine And Related Shortness Of Breath    SOB when given IV once "to me it was mild; I called out fast for help; never had to intubate" (05/23/2012)  . Ace Inhibitors Other (See Comments)    Unknown reaction but her physician stated that she can't take it (specifically lisinopril)  . Tylenol (Acetaminophen) Other (See Comments)    Patient states that the doctor told her she has a Fatty Liver.   Scheduled Meds:   . aspirin EC  81 mg Oral Daily  . carvedilol  6.25 mg Oral BID WC  . cefTAZidime (FORTAZ)  IV  2 g Intravenous Q T,Th,Sat-1800  . darbepoetin  25 mcg Intravenous Q Tue-HD  . feeding supplement (NEPRO CARB STEADY)  237 mL Oral BID WC  . heparin  5,000 Units Subcutaneous Q8H  . hydrALAZINE  25 mg Oral Q8H  . isosorbide mononitrate  30 mg Oral Daily  . methadone  10 mg Oral TID  . micafungin (MYCAMINE) IV  100 mg Intravenous Q24H  . multivitamin  1 tablet Oral Daily  . pantoprazole  40 mg Oral Daily  . ranolazine  500 mg Oral Daily  . sevelamer carbonate  400 mg Oral TID WC  . sodium chloride  3 mL Intravenous Q12H   Continuous Infusions:  PRN Meds:.HYDROmorphone (DILAUDID) injection, ondansetron (ZOFRAN) IV, ondansetron    BP 93/63  Pulse 77  Temp 97.8 F (36.6 C) (Tympanic)  Resp 18  Ht 5\' 4"  (1.626 m)  Wt 49.4 kg (108 lb 14.5 oz)  BMI 18.69 kg/m2  SpO2 96%   PPS:40%            07/15/11 Albumin 1.6   Intake/Output Summary (Last 24 hours) at 07/14/12 1933 Last data filed at 07/14/12 1832  Gross per 24 hour  Intake    480 ml  Output   -186 ml   Net    666 ml   LBM: prior to admission  Physical Exam:  General: weak looking, cachetic  HEENT:  Anicteric, buccal mucosa moist Chest:  CTA  CVS: RRR Abdomen: tender upon palpation LUQ, BS audible Ext: no pedal edema Neuro:sluggish, but oriented  Labs: CBC    Component Value Date/Time   WBC 6.5 07/14/2012 0650   WBC 6.7 04/08/2011 1054   RBC 3.29* 07/14/2012 0650   RBC 3.40* 04/08/2011 1054   HGB 9.0* 07/14/2012 0650   HGB 10.1* 04/08/2011 1054   HCT 28.7* 07/14/2012 0650   HCT 30.1* 04/08/2011 1054   PLT 168 07/14/2012 0650   PLT 232 04/08/2011 1054   MCV 87.2 07/14/2012 0650   MCV 88.6 04/08/2011 1054   MCH 27.4 07/14/2012 0650   MCH 29.7 04/08/2011 1054   MCHC 31.4 07/14/2012 0650   MCHC 33.5 04/08/2011 1054   RDW 17.6* 07/14/2012 0650   RDW 20.4* 04/08/2011 1054   LYMPHSABS 1.6 06/25/2012 1726   LYMPHSABS 1.4 04/08/2011 1054   MONOABS 0.8 06/25/2012 1726   MONOABS 0.4 04/08/2011 1054   EOSABS 0.1 06/25/2012 1726   EOSABS 0.2 04/08/2011 1054   BASOSABS 0.0 06/25/2012 1726   BASOSABS 0.0 04/08/2011 1054    BMET    Component Value Date/Time   NA 138 07/14/2012 0650   K 4.2 07/14/2012 0650   CL 99 07/14/2012 0650   CO2 27 07/14/2012 0650   GLUCOSE 75 07/14/2012 0650   BUN 52* 07/14/2012 0650   CREATININE 3.46* 07/14/2012 0650   CALCIUM 9.8 07/14/2012 0650   GFRNONAA 13* 07/14/2012 0650   GFRAA 15* 07/14/2012 0650    CMP     Component Value Date/Time   NA 138 07/14/2012 0650   K 4.2 07/14/2012 0650   CL 99 07/14/2012 0650   CO2 27 07/14/2012 0650   GLUCOSE 75 07/14/2012 0650   BUN 52* 07/14/2012 0650   CREATININE 3.46* 07/14/2012 0650   CALCIUM 9.8 07/14/2012 0650   PROT 6.4 07/14/2012 0650   ALBUMIN 1.6* 07/14/2012 0650   AST 945* 07/14/2012 0650   ALT 358* 07/14/2012 0650   ALKPHOS 237* 07/14/2012 0650   BILITOT 0.4 07/14/2012 0650   GFRNONAA 13* 07/14/2012 0650   GFRAA 15* 07/14/2012 0650    Chest X-ray 07/12/12 Findings: Prominent cardiomegaly is  stable. Left subclavian AICD  device and leads are stable. Right internal jugular dialysis  catheter stable. Stable vascular congestion. No pulmonary edema.  Tiny pleural effusions stable.  IMPRESSION:  Improved edema. Mild vascular congestion persist.   Time In Time Out Total Time Spent with Patient Total Overall Time  11:30a 1:00p 90 min 90 min    Greater than 50%  of this time was spent counseling and coordinating care related to the above assessment and plan.   Freddie Breech, CNS-C Palliative Medicine Team [facility] Health Team Phone: [phone] Pager: [phone]

## 2012-07-14 NOTE — Progress Notes (Addendum)
Patient [name]      DOB: 05/25/1951      NFA:[id number]  Patients is a 62 yo AAF with PMH: Gallstone pancreatitis (s/p cholecystectomy 2003), chronic pancreatitis (s/p resection of pancreas), CAD (s/p CABG), ischemic cardiomyopathy (s/p Medtronic/AICD-EF 10% 04/2012), sever global hypokinesis, mild aortic stenosis, moderate to sever mitral valve regurgitation, ESRD (initiated on peritoneal dialysis 11/2 years ago then transitioned to HD 6 mths ago (Tue/Thus/Sat), history also includes DM, NTH, anemia, esophageal varices, fatty liver disease, bacteremia, Candida fungemia, perforated bowel and GI bleed. Patient admitted with SOB and weakness on 07/13/11, she was recently discharged from the hospital  on 07/09/2012, after aspiration of her infected pancreatic pseudocyst. The culture showed enterococcus and yeast.   Palliative Goals of care meeting with patient today (known to our service from consult done 05/2012, overall patient noted to be much weaker with increased weight loss since we saw her in December. Per [name], (close friend patient refers to as her daughter) patient spends most of day in bed, unable to care for basic needs. [name] states mental and functional status has declined dramatically over past month, patient only goes for dialysis 2/3 days per week generally.   Had frank discussion with patient about the safety issue it would be to go back home (lives alone), in the context of her weakness and poor appetite which will lead to further decline. Discussed candidly other options would be go to SNF or if she considered stopping HD she could be made comfortable by transitioning to residential hospice care.   Patient stated, she is not ready to die at this time and understands there may come a time in near future when she will have to make decision to stop receiving HD. With permission of patient I am going to try to arrange a phone conversation with her son [name]  [facility]) in [place], patients son and her daughter in [place], [place], and also have [name] in attendance, hopeful to do this tomorrow afternoon. [name] is contacting patients children and will try to coordinate time for meeting, will call PMT phone with time that would work for them.  Patient has not been able to afford Methadone prescription, so after each discharge she only uses Dilaudid for pain at home averaging 5-6 pills/day.   Full Palliative care note to follow.   Freddie Breech, CNS-C Palliative Medicine Team [facility] Health Team Phone: [phone] Pager: [phone]

## 2012-07-14 NOTE — Progress Notes (Signed)
Subjective: [name] dialysis, no complaints  Objective Vital signs in last 24 hours: Filed Vitals:   07/14/12 0910 07/14/12 0940 07/14/12 1010 07/14/12 1040  BP: 96/63 85/53 97/60  83/54  Pulse: 70 65 64 64  Temp:      TempSrc:      Resp: 12 12 12 12   Height:      Weight:      SpO2:       Weight change: -0.7 kg (-1 lb 8.7 oz)  Intake/Output Summary (Last 24 hours) at 07/14/12 1103 Last data filed at 07/14/12 0600  Gross per 24 hour  Intake    480 ml  Output   1900 ml  Net  -1420 ml   Labs: Basic Metabolic Panel:  Lab 07/14/12 4098 07/13/12 1344 07/12/12 1240  NA 138 141 133*  K 4.2 3.5 6.3*  CL 99 100 103  CO2 27 26 --  GLUCOSE 75 105* 86  BUN 52* 50* 103*  CREATININE 3.46* 2.96* 5.60*  ALB -- -- --  CALCIUM 9.8 9.5 --  PHOS -- 5.0* --   Liver Function Tests:  Lab 07/14/12 0650 07/13/12 1344  AST 945* --  ALT 358* --  ALKPHOS 237* --  BILITOT 0.4 --  PROT 6.4 --  ALBUMIN 1.6* 2.0*    Lab 07/12/12 1341  LIPASE 29  AMYLASE --   No results found for this basename: AMMONIA:3 in the last 168 hours CBC:  Lab 07/14/12 0650 07/13/12 1344 07/12/12 1240 07/12/12 1213  WBC 6.5 14.0* -- 18.7*  NEUTROABS -- -- -- --  HGB 9.0* 10.5* 10.2* 9.3*  HCT 28.7* 31.3* 30.0* 29.6*  MCV 87.2 86.2 -- 88.6  PLT 168 251 -- 318   PT/INR: @labrcntip (inr:5) Cardiac Enzymes: No results found for this basename: CKTOTAL:5,CKMB:5,CKMBINDEX:5,TROPONINI:5 in the last 168 hours CBG:  Lab 07/14/12 0734 07/13/12 2036 07/13/12 1659 07/13/12 0753 07/12/12 2122  GLUCAP 82 109* 83 84 112*    Iron Studies: No results found for this basename: IRON:30,TIBC:30,TRANSFERRIN:30,FERRITIN:30 in the last 168 hours  Physical Exam:  Blood pressure 83/54, pulse 64, temperature 96.9 F (36.1 C), temperature source Axillary, resp. rate 12, height 5\' 4"  (1.626 m), weight 49.4 kg (108 lb 14.5 oz), SpO2 97.00%.  On dialysis, frail, in pain, fully oriented Throat clear No JVD  Lungs clear bilat  RRR  no rub or gallop Mild epig tenderness, no ascites No edema of ext, muscle wasting No asterixis, Ox3, nonfocal R IJ HD cath in chest, old AVG L arm  Outpatient HD: (From last admit 1 wk ago) [place] [facility] On TTS, 4 hrs, Edw= 53.5 > 51 after last admit, heparin , EPO 1500 units q hd, no vit d last pth=178.2 (05/05/12) Bath= 3.0 k, 2.5 Ca, 1.0 mg.  R IJ HD cath   Impression/Plan   1. ESRD  HD today, cont TTS schedule Hep 1600u w hd R IJ cath  2. Anemia of CKD  Hb 10  Darbe 25/wk q Tues    3. Abd pain / hx chronic panc, pseudocysts and recurrent pancreatic bed infection  On vanc/fortaz and po voriconazole, same regimen from d/c last admit; per primary team Enterococcus +cx from panc aspirate 1/14  Doesn't want surgery and prob poor candidate- pall care meeting w pt today DNR  4. Hyperkalemia  Resolved  5. Severe DCM / EF 20% / ICD   6. FTT / cachexia / chronic pain  Takes methadone   7. HTN/volume  No vol excess / on coreg  and hydralazine BP normal Below dry wt by 2 kg, BP soft, keep even at HD today  8. MBD-CKD  PTH 178 Nov 2013  No vit d / cont Renvela   9. HD access L arm AVGG (04/08/12) by Dr Hollace Hayward [facility] ligated 06/20/12 d/t LUE edema (PPM same side). HD via R IJ cath Will f/u as OP w Dr Hollace Hayward for next access planning after d/c     Vinson Moselle  MD [facility] [phone] pgr    [phone] cell 07/14/2012, 11:03 AM

## 2012-07-15 DIAGNOSIS — R188 Other ascites: Secondary | ICD-10-CM

## 2012-07-15 LAB — CBC
Hemoglobin: 8.6 g/dL — ABNORMAL LOW (ref 12.0–15.0)
Platelets: 129 10*3/uL — ABNORMAL LOW (ref 150–400)
RBC: 3.11 MIL/uL — ABNORMAL LOW (ref 3.87–5.11)
WBC: 8.7 10*3/uL (ref 4.0–10.5)

## 2012-07-15 LAB — VANCOMYCIN, RANDOM: Vancomycin Rm: 11.2 ug/mL

## 2012-07-15 LAB — BASIC METABOLIC PANEL
CO2: 29 mEq/L (ref 19–32)
Chloride: 99 mEq/L (ref 96–112)
Glucose, Bld: 112 mg/dL — ABNORMAL HIGH (ref 70–99)
Potassium: 3.2 mEq/L — ABNORMAL LOW (ref 3.5–5.1)
Sodium: 136 mEq/L (ref 135–145)

## 2012-07-15 LAB — HEPATIC FUNCTION PANEL
Albumin: 1.6 g/dL — ABNORMAL LOW (ref 3.5–5.2)
Alkaline Phosphatase: 215 U/L — ABNORMAL HIGH (ref 39–117)
Indirect Bilirubin: 0.2 mg/dL — ABNORMAL LOW (ref 0.3–0.9)
Total Protein: 6.3 g/dL (ref 6.0–8.3)

## 2012-07-15 LAB — PROTIME-INR: Prothrombin Time: 18.6 seconds — ABNORMAL HIGH (ref 11.6–15.2)

## 2012-07-15 MED ORDER — VANCOMYCIN HCL 500 MG IV SOLR
500.0000 mg | Freq: Once | INTRAVENOUS | Status: AC
  Administered 2012-07-15: 500 mg via INTRAVENOUS
  Filled 2012-07-15: qty 500

## 2012-07-15 MED ORDER — SEVELAMER HCL 400 MG PO TABS
400.0000 mg | ORAL_TABLET | Freq: Three times a day (TID) | ORAL | Status: DC
  Administered 2012-07-15 – 2012-07-20 (×14): 400 mg via ORAL
  Filled 2012-07-15 (×19): qty 1

## 2012-07-15 NOTE — Progress Notes (Signed)
[facility] PROGRESS NOTE  VANSHIKA JASTRZEBSKI ZOX:[id number] DOB: December 05, 1950 DOA: 07/12/2012 PCP: Jyl Heinz, MD  Assessment/Plan:  Shortness of breath  - Although she looks clinically overloaded, chest x-ray and clinical examination does not show pulmonary edema.  - She is getting progressively worse overall.  - No evidence of pneumonia, pulmonary edema per chest x-ray.  - getting IHD, she appears stable from a volume standpoint  Transaminitis - may be related to her Voriconazole vs congestive hepatopathy 2/2 end stage heart failure vs liver pathology - will d/c that and switch to Micafungin. ID consulted, appreciate further input.  - LFTs improved today. RUQ US done yesterday.  Cirrhosis  - decompensated currently - ascites seems that is bothering patient.  - will ask for US guided para with labs  Infected hemorrhagic pancreatic pseudocyst  - Status post aspiration on 06/28/2012, aspirate grew enterococcus and yeast.  - Patient is on Fortaz, vancomycin and voriconazole at home.  - This is being chronic problem, seen by general surgery earlier this month and she refused surgical excision.   ESRD  - On dialysis on Tuesday, Thursday and Saturday.  - nephrology following, appreciate their input  Hyperkalemia  - HD today  Chronic systolic CHF, severe  -With LVEF of 10% on 05/08/2012 by 2-D echocardiogram.  -Has Lorcet edema and orthopnea, no significant pulmonary edema or pleural effusion.  -She is on Coreg, Imdur and hydralazine.  -She has ICD in place, she has LBBB,  Chronic abdominal pain  -She is on methadone and oral Dilaudid. Chronic abdominal pain secondary to chronic pancreatic bed problems.  -Had long standing history of chronic pancreatitis and infected pancreatic pseudocyst   Disposition  -Patient said she is very weak, to the point she couldn't walk.  -I asked PT/OT to evaluate her, she is likely to need an SNF placement.   Code Status: DNR/DNI Family  Communication: none (indicate person spoken with, relationship, and if by phone, the number) Disposition Plan: pending PT and further discussions with palliative.  Consultants:  Nephrology  ID  Procedures:  none  Antibiotics (started last hospitalization ~06/29/12 with plans for a month)  Vancomycin  Ceftazidime  Voriconazole stopped 1/30  Micafungin 1/30 >>  HPI/Subjective: Endorses feeling tired and weak.  Objective: Filed Vitals:   07/15/12 0529 07/15/12 0956 07/15/12 1218 07/15/12 1700  BP: 115/74 92/70 96/67  106/74  Pulse: 81 82 82 76  Temp: 97.8 F (36.6 C) 98.6 F (37 C) 98.7 F (37.1 C) 97.4 F (36.3 C)  TempSrc: Oral     Resp: 16 15 17 15   Height:      Weight:      SpO2: 99%  95% 98%    Intake/Output Summary (Last 24 hours) at 07/15/12 1709 Last data filed at 07/15/12 1419  Gross per 24 hour  Intake    510 ml  Output      0 ml  Net    510 ml   Filed Weights   07/14/12 0800 07/14/12 1140 07/14/12 2058  Weight: 49.4 kg (108 lb 14.5 oz) 49.4 kg (108 lb 14.5 oz) 50.5 kg (111 lb 5.3 oz)    Exam:   General:  Frail appearing AA woman  Cardiovascular: RRR  Respiratory: CTA biL, no crackles  Abdomen: soft, NTTP, mildly more distended than yesterday but without obvious fluid wave  MSK - trace edema  Data Reviewed: Basic Metabolic Panel:  Lab 07/15/12 8119 07/14/12 0650 07/13/12 1344 07/12/12 1240  NA 136 138 141 133*  K 3.2* 4.2 3.5 6.3*  CL 99 99 100 103  CO2 29 27 26  --  GLUCOSE 112* 75 105* 86  BUN 21 52* 50* 103*  CREATININE 2.10* 3.46* 2.96* 5.60*  CALCIUM 9.5 9.8 9.5 --  MG -- -- -- --  PHOS -- -- 5.0* --    Lab 07/12/12 1341  LIPASE 29  AMYLASE --   CBC:  Lab 07/15/12 0630 07/14/12 0650 07/13/12 1344 07/12/12 1240 07/12/12 1213  WBC 8.7 6.5 14.0* -- 18.7*  NEUTROABS -- -- -- -- --  HGB 8.6* 9.0* 10.5* 10.2* 9.3*  HCT 27.9* 28.7* 31.3* 30.0* 29.6*  MCV 89.7 87.2 86.2 -- 88.6  PLT 129* 168 251 -- 318   BNP (last 3  results)  Basename 06/02/12 1944 05/08/12 0515 04/21/12 1419  PROBNP >70000.0* >70000.0* >70000.0*   CBG:  Lab 07/14/12 2100 07/14/12 1642 07/14/12 0734 07/13/12 2036 07/13/12 1659  GLUCAP 109* 126* 82 109* 83    Recent Results (from the past 240 hour(s))  URINE CULTURE     Status: Normal   Collection Time   07/12/12  1:32 PM      Component Value Range Status Comment   Specimen Description URINE, CATHETERIZED   Final    Special Requests NONE   Final    Culture  Setup Time 07/12/2012 14:21   Final    Colony Count NO GROWTH   Final    Culture NO GROWTH   Final    Report Status 07/13/2012 FINAL   Final      Studies: US Abdomen Complete  07/14/2012  *RADIOLOGY REPORT*  Clinical Data:  62 year old female with abdominal pain and elevated LFTs.  History of cholecystectomy, cirrhosis, chronic pancreatitis, cardiomyopathy and end-stage renal disease.  ABDOMINAL ULTRASOUND COMPLETE  Comparison:  07/12/2012 and prior CTs.  Findings:  Gallbladder:  The gallbladder is not visualized compatible with cholecystectomy.  Common Bile Duct:  There is no evidence of intrahepatic or extrahepatic biliary dilation. The CBD measures 6.0 mm in greatest diameter.  Liver:  The liver is heterogeneous in echotexture with increased echogenicity. No focal abnormalities are identified.  IVC:  Appears normal.  Pancreas: A cyst within the pancreatic tail appears unchanged.  Spleen:  Within normal limits in size and echotexture.  Right kidney: The right kidney is atrophic and diffusely echogenic measuring 7 cm.  There is no evidence of hydronephrosis or definite renal mass.  Left kidney: The left kidney is atrophic and diffusely echogenic measuring 7 cm.  There is no evidence of hydronephrosis or definite renal mass. Several cysts are present.  Abdominal Aorta:  No abdominal aortic aneurysm identified.  Ascites and bilateral pleural effusions are noted.  IMPRESSION:  Heterogeneous and increased echogenicity of the liver which  can be seen with this patient's history of cirrhosis and fatty infiltration.  Chronic medical renal disease.  Ascites and bilateral pleural effusions.   Original Report Authenticated By: Harmon Pier, M.D.     Scheduled Meds:    . aspirin EC  81 mg Oral Daily  . carvedilol  6.25 mg Oral BID WC  . darbepoetin  25 mcg Intravenous Q Tue-HD  . feeding supplement (NEPRO CARB STEADY)  237 mL Oral BID WC  . heparin  5,000 Units Subcutaneous Q8H  . hydrALAZINE  25 mg Oral Q8H  . isosorbide mononitrate  30 mg Oral Daily  . methadone  10 mg Oral TID  . micafungin (MYCAMINE) IV  100 mg Intravenous Q24H  . multivitamin  1  tablet Oral Daily  . pantoprazole  40 mg Oral Daily  . ranolazine  500 mg Oral Daily  . senna-docusate  1 tablet Oral BID  . sevelamer  400 mg Oral TID WC  . sodium chloride  3 mL Intravenous Q12H   Continuous Infusions:   Active Problems:  Ischemic cardiomyopathy, EF 20-25% echo April 2013, now 10% by cath (05/06/12)  Chronic systolic congestive heart failure, NYHA class 3  LBBB (left bundle branch block)  Pancreatitis, chronic  ESRD on hemodialysis  Hyperkalemia  Complication of vascular access for dialysis  Pain in the abdomen  Failure to thrive in adult  Constipation    Pamella Pert  [facility] Pager [phone]. If 7PM-7AM, please contact night-coverage at [email or web address], password [facility] 07/15/2012, 5:09 PM  LOS: 3 days

## 2012-07-15 NOTE — Progress Notes (Signed)
ANTIBIOTIC CONSULT NOTE - FOLLOW UP  Pharmacy Consult for Vancomycin Indication: Infected pancreatic pseudocyst/abscess   Allergies  Allergen Reactions  . Codeine Itching  . Morphine And Related Shortness Of Breath    SOB when given IV once "to me it was mild; I called out fast for help; never had to intubate" (05/23/2012)  . Ace Inhibitors Other (See Comments)    Unknown reaction but her physician stated that she can't take it (specifically lisinopril)  . Tylenol (Acetaminophen) Other (See Comments)    Patient states that the doctor told her she has a Fatty Liver.    Patient Measurements: Height: 5\' 4"  (162.6 cm) Weight: 111 lb 5.3 oz (50.5 kg) IBW/kg (Calculated) : 54.7   Vital Signs: Temp: 97.8 F (36.6 C) (01/31 0529) Temp src: Oral (01/31 0529) BP: 115/74 mmHg (01/31 0529) Pulse Rate: 81  (01/31 0529) Intake/Output from previous day: 01/30 0701 - 01/31 0700 In: 390 [P.O.:240; IV Piggyback:150] Out: -186  Intake/Output from this shift:    Labs:  Basename 07/15/12 0630 07/14/12 0650 07/13/12 1344  WBC 8.7 6.5 14.0*  HGB 8.6* 9.0* 10.5*  PLT 129* 168 251  LABCREA -- -- --  CREATININE 2.10* 3.46* 2.96*   Estimated Creatinine Clearance: 21.9 ml/min (by C-G formula based on Cr of 2.1).  Basename 07/15/12 0630  VANCOTROUGH --  Leodis Binet --  VANCORANDOM 11.2  GENTTROUGH --  GENTPEAK --  GENTRANDOM --  TOBRATROUGH --  TOBRAPEAK --  TOBRARND --  AMIKACINPEAK --  AMIKACINTROU --  AMIKACIN --     Microbiology: Recent Results (from the past 720 hour(s))  MRSA PCR SCREENING     Status: Normal   Collection Time   06/26/12  3:20 AM      Component Value Range Status Comment   MRSA by PCR NEGATIVE  NEGATIVE Final   URINE CULTURE     Status: Normal   Collection Time   06/27/12  6:36 AM      Component Value Range Status Comment   Specimen Description URINE, RANDOM   Final    Special Requests NONE   Final    Culture  Setup Time 06/27/2012 16:26   Final    Colony Count 40,000 COLONIES/ML   Final    Culture     Final    Value: Multiple bacterial morphotypes present, none predominant. Suggest appropriate recollection if clinically indicated.   Report Status 06/28/2012 FINAL   Final   CULTURE, ROUTINE-ABSCESS     Status: Normal   Collection Time   06/28/12  4:26 PM      Component Value Range Status Comment   Specimen Description ABSCESS   Final    Special Requests LEFT RETROPERITONEAL FLUID   Final    Gram Stain     Final    Value: ABUNDANT WBC PRESENT,BOTH PMN AND MONONUCLEAR     FEW YEAST   Culture     Final    Value: FEW ENTEROCOCCUS SPECIES     FEW YEAST CONSISTENT WITH CANDIDA SPECIES   Report Status 07/05/2012 FINAL   Final    Organism ID, Bacteria ENTEROCOCCUS SPECIES   Final   ANAEROBIC CULTURE     Status: Normal   Collection Time   06/28/12  4:26 PM      Component Value Range Status Comment   Specimen Description ABSCESS   Final    Special Requests LEFT RETROPERITONEAL FLUID   Final    Gram Stain     Final  Value: ABUNDANT WBC PRESENT,BOTH PMN AND MONONUCLEAR     FEW YEAST   Culture NO ANAEROBES ISOLATED   Final    Report Status 07/03/2012 FINAL   Final   FUNGUS CULTURE W SMEAR     Status: Normal (Preliminary result)   Collection Time   06/28/12  4:26 PM      Component Value Range Status Comment   Specimen Description ABSCESS   Final    Special Requests LEFT RETROPERITONEAL FLUID   Final    Fungal Smear NO YEAST OR FUNGAL ELEMENTS SEEN   Final    Culture YEAST ISOLATED;ID TO FOLLOW   Final    Report Status PENDING   Incomplete   URINE CULTURE     Status: Normal   Collection Time   07/12/12  1:32 PM      Component Value Range Status Comment   Specimen Description URINE, CATHETERIZED   Final    Special Requests NONE   Final    Culture  Setup Time 07/12/2012 14:21   Final    Colony Count NO GROWTH   Final    Culture NO GROWTH   Final    Report Status 07/13/2012 FINAL   Final     Anti-infectives     Start      Dose/Rate Route Frequency Ordered Stop   07/15/12 1000   vancomycin (VANCOCIN) 500 mg in sodium chloride 0.9 % 100 mL IVPB        500 mg 100 mL/hr over 60 Minutes Intravenous  Once 07/15/12 0804     07/14/12 2000   micafungin (MYCAMINE) 100 mg in sodium chloride 0.9 % 100 mL IVPB        100 mg 100 mL/hr over 1 Hours Intravenous Every 24 hours 07/14/12 1007     07/14/12 1800   cefTAZidime (FORTAZ) 2 g in dextrose 5 % 50 mL IVPB        2 g 100 mL/hr over 30 Minutes Intravenous Every T-Th-Sa (1800) 07/14/12 0952     07/14/12 1200   vancomycin (VANCOCIN) 500 mg in sodium chloride 0.9 % 100 mL IVPB        500 mg 100 mL/hr over 60 Minutes Intravenous Every Thu (Hemodialysis) 07/14/12 0909 07/14/12 1153   07/13/12 1800   cefTAZidime (FORTAZ) 2 g in dextrose 5 % 50 mL IVPB        2 g 100 mL/hr over 30 Minutes Intravenous  Once 07/13/12 1132 07/13/12 1857   07/13/12 1630   voriconazole (VFEND) tablet 200 mg  Status:  Discontinued        200 mg Oral 2 times daily before meals 07/13/12 1124 07/14/12 1006   07/13/12 1600   vancomycin (VANCOCIN) 500 mg in sodium chloride 0.9 % 100 mL IVPB        500 mg 100 mL/hr over 60 Minutes Intravenous To Hemodialysis 07/13/12 1132 07/13/12 1518   07/13/12 0959   voriconazole (VFEND) tablet 200 mg  Status:  Discontinued        200 mg Oral 2 times daily 07/13/12 0959 07/13/12 1124   07/12/12 1545   cefTRIAXone (ROCEPHIN) 1 g in dextrose 5 % 50 mL IVPB        1 g 100 mL/hr over 30 Minutes Intravenous  Once 07/12/12 1534 07/12/12 1643          Assessment: 63 y.o F with ESRD resumed on Vancomycin initiated on 06/29/12 during an earlier admission this month for an infected pancreatic pseudocyst. The  patient was transitioned from Micafungin to Voriconazole upon discharge on 07/07/12 however has been noted to have a large elevated in LFTs (AST/ALT 945/358 on 07/14/12, baseline 14/5 on 06/25/12), therefore Voriconazole was d/ced and changed back to Micafungin on  07/14/12.  The patient is noted to have cultures positive for Enterococcus (S-amp, Vanc) and yeast (sensitivities pending). Per ID -- to continue Micafungin (unless LFTs continue to rise) and Vancomycin -- ID stopped Ceftazidime today. The patient's LFTs show a trend downward today, AST/ALT 393/263 << 945/358.   The patient receives HD on T/Th/Sat with a last HD session on 07/14/12 (3.5 hr BFR 400). A random Vancomycin level checked this morning that was SUBtherapeutic (Vanc random~11.2 mcg/ml, goal of 15-25 mcg/ml). Will plan to give an extra dose this morning and then resume post-HD maintenance doses.   Goal of Therapy:  Pre-HD Vancomycin level 15-25 mcg/ml  Plan:  1. Vancomycin 500 mg IV x 1 dose this morning 2. Continue Vancomycin 500 mg post HD sessions on T/Th/Sat (on Sat, 2/1) 3. Will continue to follow HD schedule/duration, culture results, LOT, and antibiotic de-escalation plans   Georgina Pillion, PharmD, BCPS Clinical Pharmacist Pager: [phone] 07/15/2012 8:36 AM

## 2012-07-15 NOTE — Evaluation (Signed)
Occupational Therapy Evaluation Patient Details Name: [name] MRN: [id number] DOB: [birth year] Today's Date: 07/15/2012 Time: 4098-1191 OT Time Calculation (min): 18 min  OT Assessment / Plan / Recommendation Clinical Impression  This 62 yo female admitted with shortness of breath and generalized weakness presents to acute OT wtih problems below. Will benefit from continued OT at SNF.    OT Assessment  Patient needs continued OT Services    Follow Up Recommendations  SNF    Barriers to Discharge Decreased caregiver support    Equipment Recommendations  3 in 1 bedside comode       Frequency  Min 2X/week    Precautions / Restrictions Precautions Precautions: Fall Restrictions Weight Bearing Restrictions: No       ADL  Eating/Feeding: Simulated;Set up;Supervision/safety Where Assessed - Eating/Feeding: Bed level Grooming: Simulated;Set up;Supervision/safety Where Assessed - Grooming: Supported sitting Upper Body Bathing: Simulated;Supervision/safety;Set up Where Assessed - Upper Body Bathing: Supported sitting Lower Body Bathing: Simulated;Maximal assistance Where Assessed - Lower Body Bathing: Supported sit to stand Upper Body Dressing: Simulated;Moderate assistance Where Assessed - Upper Body Dressing: Supported sitting Lower Body Dressing: Simulated;+1 Total assistance Where Assessed - Lower Body Dressing: Supported sit to Pharmacist, hospital: [employer] Method: Sit to Barista:  (Bed 3 steps to recliner with RW) Toileting - Clothing Manipulation and Hygiene: Simulated;Moderate assistance Where Assessed - Toileting Clothing Manipulation and Hygiene: Standing Equipment Used: Rolling walker;Gait belt Transfers/Ambulation Related to ADLs: Min A for all ADL Comments: Cannot cross legs to get to feet    OT Diagnosis: Generalized weakness  OT Problem List: Decreased strength;Decreased activity  tolerance;Impaired balance (sitting and/or standing);Decreased knowledge of use of DME or AE OT Treatment Interventions: Self-care/ADL training;Energy conservation;Therapeutic activities;Patient/family education;Balance training   OT Goals Acute Rehab OT Goals OT Goal Formulation: With patient Time For Goal Achievement: 07/29/12 Potential to Achieve Goals: Good ADL Goals Pt Will Perform Grooming: with set-up;with supervision;Unsupported;Standing at sink (2 tasks) ADL Goal: Grooming - Progress: Goal set today Pt Will Perform Lower Body Dressing: with min assist;Sit to stand from chair;Sit to stand from bed;Unsupported;with adaptive equipment ADL Goal: Lower Body Dressing - Progress: Goal set today Pt Will Transfer to Toilet: with supervision;Ambulation;Regular height toilet;Grab bars;Comfort height toilet;3-in-1 ADL Goal: Toilet Transfer - Progress: Goal set today Pt Will Perform Toileting - Clothing Manipulation: with supervision;Standing ADL Goal: Toileting - Clothing Manipulation - Progress: Goal set today Pt Will Perform Toileting - Hygiene: Independently;Sitting on 3-in-1 or toilet ADL Goal: Toileting - Hygiene - Progress: Goal set today Miscellaneous OT Goals Miscellaneous OT Goal #1: Pt will be Supervison for in and out of bed for BADLs OT Goal: Miscellaneous Goal #1 - Progress: Goal set today  Visit Information  Last OT Received On: 07/15/12 Assistance Needed: +1 PT/OT Co-Evaluation/Treatment: Yes    Subjective Data  Subjective: I don't know if I can get up or not, i don't want to fall   Prior Functioning     Home Living Lives With: Alone Available Help at Discharge: Family;Home health Type of Home: Apartment Home Access: Level entry Home Layout: One level Home Adaptive Equipment: Walker - rolling;Bedside commode/3-in-1 Prior Function Level of Independence: Needs assistance Needs Assistance: Bathing Bath: Moderate Able to Take Stairs?: No Driving: No Vocation:  On disability Comments: Was walking with walker to bathroom with great difficulty PTA Communication Communication: No difficulties Dominant Hand: Right            Cognition  Overall Cognitive Status: Appears within  functional limits for tasks assessed/performed Arousal/Alertness: Awake/alert Orientation Level: Appears intact for tasks assessed Behavior During Session: [facility] for tasks performed    Extremity/Trunk Assessment Right Upper Extremity Assessment RUE ROM/Strength/Tone:  (grossly 3/5) Left Upper Extremity Assessment LUE ROM/Strength/Tone:  (grossly 3/5) Right Lower Extremity Assessment RLE ROM/Strength/Tone: Deficits RLE ROM/Strength/Tone Deficits: grossly 4/5 Left Lower Extremity Assessment LLE ROM/Strength/Tone: Deficits LLE ROM/Strength/Tone Deficits: grossly 4/5     Mobility Bed Mobility Bed Mobility: Rolling Right;Rolling Left;Left Sidelying to Sit;Sitting - Scoot to Edge of Bed Rolling Right: 5: Supervision;With rail Rolling Left: 5: Supervision;With rail Left Sidelying to Sit: 3: Mod assist;With rails;HOB flat Sitting - Scoot to Edge of Bed: 4: Min guard;With rail Transfers Transfers: Sit to Stand;Stand to Sit Sit to Stand: 4: Min assist;With upper extremity assist;From bed Stand to Sit: 4: Min assist;With upper extremity assist;With armrests;To chair/3-in-1 Details for Transfer Assistance: VCs for safe hand placement           Balance Static Sitting Balance Static Sitting - Balance Support: Feet supported;No upper extremity supported Static Sitting - Level of Assistance:  (Supervision) Static Sitting - Comment/# of Minutes: 5 mintues   End of Session OT - End of Session Equipment Utilized During Treatment: Gait belt (RW) Activity Tolerance: Patient limited by fatigue Patient left: in chair;with call bell/phone within reach       Evette Georges [phone] 07/15/2012, 2:57 PM

## 2012-07-15 NOTE — Evaluation (Signed)
Physical Therapy Evaluation Patient Details Name: [name] MRN: [id number] DOB: 16-Nov-1950 Today's Date: 07/15/2012 Time: 4098-1191 PT Time Calculation (min): 18 min  PT Assessment / Plan / Recommendation Clinical Impression  Pt adm with pancreatic abscess and weakness. Pt too weak to manage at home.  Pt agreeable to ST-SNF.    PT Assessment  Patient needs continued PT services    Follow Up Recommendations  SNF    Does the patient have the potential to tolerate intense rehabilitation      Barriers to Discharge Decreased caregiver support      Equipment Recommendations  None recommended by PT    Recommendations for Other Services     Frequency Min 3X/week    Precautions / Restrictions Precautions Precautions: Fall Restrictions Weight Bearing Restrictions: No   Pertinent Vitals/Pain See flow sheet.      Mobility  Bed Mobility Bed Mobility: Rolling Right;Rolling Left;Left Sidelying to Sit;Sitting - Scoot to Edge of Bed Rolling Right: 5: Supervision;With rail Rolling Left: 5: Supervision;With rail Left Sidelying to Sit: 3: Mod assist;With rails;HOB flat Sitting - Scoot to Edge of Bed: 4: Min guard;With rail Transfers Sit to Stand: 4: Min assist;With upper extremity assist;From bed Stand to Sit: 4: Min assist;With upper extremity assist;With armrests;To chair/3-in-1 Details for Transfer Assistance: VCs for safe hand placement Ambulation/Gait Ambulation/Gait Assistance: 4: Min assist Ambulation Distance (Feet): 5 Feet (forward and backward) Assistive device: Rolling walker Ambulation/Gait Assistance Details: verbal cues to stand more erect Gait Pattern: Decreased step length - right;Decreased step length - left;Shuffle;Trunk flexed Gait velocity: decr    Shoulder Instructions     Exercises     PT Diagnosis: Difficulty walking;Generalized weakness  PT Problem List: Decreased strength;Decreased activity tolerance;Decreased balance;Decreased mobility PT  Treatment Interventions: DME instruction;Gait training;Patient/family education;Functional mobility training;Therapeutic activities;Therapeutic exercise;Balance training   PT Goals Acute Rehab PT Goals PT Goal Formulation: With patient Time For Goal Achievement: 07/22/12 Potential to Achieve Goals: Good Pt will go Supine/Side to Sit: with supervision PT Goal: Supine/Side to Sit - Progress: Goal set today Pt will go Sit to Supine/Side: with supervision PT Goal: Sit to Supine/Side - Progress: Goal set today Pt will go Sit to Stand: with supervision PT Goal: Sit to Stand - Progress: Goal set today Pt will go Stand to Sit: with supervision PT Goal: Stand to Sit - Progress: Goal set today Pt will Ambulate: 51 - 150 feet;with supervision;with least restrictive assistive device PT Goal: Ambulate - Progress: Goal set today  Visit Information  Last PT Received On: 07/15/12 Assistance Needed: +1    Subjective Data  Subjective: Pt states she is weak. Patient Stated Goal: Get stronger   Prior Functioning  Home Living Lives With: Alone Available Help at Discharge: Family;Home health Type of Home: Apartment Home Access: Level entry Home Layout: One level Home Adaptive Equipment: Walker - rolling;Bedside commode/3-in-1 Prior Function Level of Independence: Needs assistance Needs Assistance: Bathing Bath: Moderate Able to Take Stairs?: No Driving: No Vocation: On disability Comments: Was walking with walker to bathroom with great difficulty PTA Communication Communication: No difficulties Dominant Hand: Right    Cognition  Overall Cognitive Status: Appears within functional limits for tasks assessed/performed Arousal/Alertness: Awake/alert Orientation Level: Appears intact for tasks assessed Behavior During Session: [facility] for tasks performed    Extremity/Trunk Assessment Right Upper Extremity Assessment RUE ROM/Strength/Tone:  (grossly 3/5) Left Upper Extremity Assessment LUE  ROM/Strength/Tone:  (grossly 3/5) Right Lower Extremity Assessment RLE ROM/Strength/Tone: Deficits RLE ROM/Strength/Tone Deficits: grossly 4/5 Left Lower Extremity  Assessment LLE ROM/Strength/Tone: Deficits LLE ROM/Strength/Tone Deficits: grossly 4/5   Balance Static Sitting Balance Static Sitting - Balance Support: Feet supported;No upper extremity supported Static Sitting - Level of Assistance:  (Supervision) Static Sitting - Comment/# of Minutes: 5 mintues  End of Session PT - End of Session Activity Tolerance: Patient limited by fatigue Patient left: in chair;with call bell/phone within reach Nurse Communication: Mobility status  GP     [facility] 07/15/2012, 3:03 PM  [facility] PT [phone]

## 2012-07-15 NOTE — Progress Notes (Signed)
Palliative Medicine Team SW Pt discussed in PMT rounds, GOC conference call with pt's son, dtr, and local caregiver scheduled for this afternoon at 5:30pm. Referred for psychosocial support. Upon visit, pt sleeping soundly, only woke to touch. Pt reports she is "trying to [her] spirits up". Pt made aware of planned conference call, denies other needs at this time. Pt quite fatigued, requests to be left to nap. Available as needed.   Kennieth Francois, LCSWA PMT Phone [phone]

## 2012-07-15 NOTE — Progress Notes (Signed)
INFECTIOUS DISEASE PROGRESS NOTE  ID: [name] is a 62 y.o. female with  Active Problems:  Ischemic cardiomyopathy, EF 20-25% echo April 2013, now 10% by cath (05/06/12)  Chronic systolic congestive heart failure, NYHA class 3  LBBB (left bundle branch block)  Pancreatitis, chronic  ESRD on hemodialysis  Hyperkalemia  Complication of vascular access for dialysis  Pain in the abdomen  Failure to thrive in adult  Constipation  Subjective: Without complaints, fatigued  Abtx:  Anti-infectives     Start     Dose/Rate Route Frequency Ordered Stop   07/15/12 1000   vancomycin (VANCOCIN) 500 mg in sodium chloride 0.9 % 100 mL IVPB        500 mg 100 mL/hr over 60 Minutes Intravenous  Once 07/15/12 0804     07/14/12 2000   micafungin (MYCAMINE) 100 mg in sodium chloride 0.9 % 100 mL IVPB        100 mg 100 mL/hr over 1 Hours Intravenous Every 24 hours 07/14/12 1007     07/14/12 1800   cefTAZidime (FORTAZ) 2 g in dextrose 5 % 50 mL IVPB  Status:  Discontinued        2 g 100 mL/hr over 30 Minutes Intravenous Every T-Th-Sa (1800) 07/14/12 0952 07/15/12 1031   07/14/12 1200   vancomycin (VANCOCIN) 500 mg in sodium chloride 0.9 % 100 mL IVPB        500 mg 100 mL/hr over 60 Minutes Intravenous Every Thu (Hemodialysis) 07/14/12 0909 07/14/12 1153   07/13/12 1800   cefTAZidime (FORTAZ) 2 g in dextrose 5 % 50 mL IVPB        2 g 100 mL/hr over 30 Minutes Intravenous  Once 07/13/12 1132 07/13/12 1857   07/13/12 1630   voriconazole (VFEND) tablet 200 mg  Status:  Discontinued        200 mg Oral 2 times daily before meals 07/13/12 1124 07/14/12 1006   07/13/12 1600   vancomycin (VANCOCIN) 500 mg in sodium chloride 0.9 % 100 mL IVPB        500 mg 100 mL/hr over 60 Minutes Intravenous To Hemodialysis 07/13/12 1132 07/13/12 1518   07/13/12 0959   voriconazole (VFEND) tablet 200 mg  Status:  Discontinued        200 mg Oral 2 times daily 07/13/12 0959 07/13/12 1124   07/12/12 1545    cefTRIAXone (ROCEPHIN) 1 g in dextrose 5 % 50 mL IVPB        1 g 100 mL/hr over 30 Minutes Intravenous  Once 07/12/12 1534 07/12/12 1643          Medications:  Scheduled:   . aspirin EC  81 mg Oral Daily  . carvedilol  6.25 mg Oral BID WC  . darbepoetin  25 mcg Intravenous Q Tue-HD  . feeding supplement (NEPRO CARB STEADY)  237 mL Oral BID WC  . heparin  5,000 Units Subcutaneous Q8H  . hydrALAZINE  25 mg Oral Q8H  . isosorbide mononitrate  30 mg Oral Daily  . methadone  10 mg Oral TID  . micafungin (MYCAMINE) IV  100 mg Intravenous Q24H  . multivitamin  1 tablet Oral Daily  . pantoprazole  40 mg Oral Daily  . ranolazine  500 mg Oral Daily  . senna-docusate  1 tablet Oral BID  . sevelamer  400 mg Oral TID WC  . sodium chloride  3 mL Intravenous Q12H  . vancomycin  500 mg Intravenous Once  Objective: Vital signs in last 24 hours: Temp:  [97.4 F (36.3 C)-98.6 F (37 C)] 98.6 F (37 C) (01/31 0956) Pulse Rate:  [72-82] 82  (01/31 0956) Resp:  [15-18] 15  (01/31 0956) BP: (92-121)/(63-77) 92/70 mmHg (01/31 0956) SpO2:  [96 %-100 %] 99 % (01/31 0529) Weight:  [50.5 kg (111 lb 5.3 oz)] 50.5 kg (111 lb 5.3 oz) (01/30 2058)   General appearance: no distress Resp: clear to auscultation bilaterally Cardio: regular rate and rhythm GI: abnormal findings:  distended and hypoactive bowel sounds  Lab Results  Basename 07/15/12 0630 07/14/12 0650  WBC 8.7 6.5  HGB 8.6* 9.0*  HCT 27.9* 28.7*  NA 136 138  K 3.2* 4.2  CL 99 99  CO2 29 27  BUN 21 52*  CREATININE 2.10* 3.46*  GLU -- --   Liver Panel  Basename 07/15/12 0630 07/14/12 0650  PROT 6.3 6.4  ALBUMIN 1.6* 1.6*  AST 393* 945*  ALT 263* 358*  ALKPHOS 215* 237*  BILITOT 0.4 0.4  BILIDIR 0.2 --  IBILI 0.2* --   Sedimentation Rate No results found for this basename: ESRSEDRATE in the last 72 hours C-Reactive Protein No results found for this basename: CRP:2 in the last 72 hours  Microbiology: Recent  Results (from the past 240 hour(s))  URINE CULTURE     Status: Normal   Collection Time   07/12/12  1:32 PM      Component Value Range Status Comment   Specimen Description URINE, CATHETERIZED   Final    Special Requests NONE   Final    Culture  Setup Time 07/12/2012 14:21   Final    Colony Count NO GROWTH   Final    Culture NO GROWTH   Final    Report Status 07/13/2012 FINAL   Final     Studies/Results: US Abdomen Complete  07/14/2012  *RADIOLOGY REPORT*  Clinical Data:  62 year old female with abdominal pain and elevated LFTs.  History of cholecystectomy, cirrhosis, chronic pancreatitis, cardiomyopathy and end-stage renal disease.  ABDOMINAL ULTRASOUND COMPLETE  Comparison:  07/12/2012 and prior CTs.  Findings:  Gallbladder:  The gallbladder is not visualized compatible with cholecystectomy.  Common Bile Duct:  There is no evidence of intrahepatic or extrahepatic biliary dilation. The CBD measures 6.0 mm in greatest diameter.  Liver:  The liver is heterogeneous in echotexture with increased echogenicity. No focal abnormalities are identified.  IVC:  Appears normal.  Pancreas: A cyst within the pancreatic tail appears unchanged.  Spleen:  Within normal limits in size and echotexture.  Right kidney: The right kidney is atrophic and diffusely echogenic measuring 7 cm.  There is no evidence of hydronephrosis or definite renal mass.  Left kidney: The left kidney is atrophic and diffusely echogenic measuring 7 cm.  There is no evidence of hydronephrosis or definite renal mass. Several cysts are present.  Abdominal Aorta:  No abdominal aortic aneurysm identified.  Ascites and bilateral pleural effusions are noted.  IMPRESSION:  Heterogeneous and increased echogenicity of the liver which can be seen with this patient's history of cirrhosis and fatty infiltration.  Chronic medical renal disease.  Ascites and bilateral pleural effusions.   Original Report Authenticated By: Harmon Pier, M.D.       Assessment/Plan: Hepatitis  Pancreatic Abscess (prev enterococcus, yeast)  Spoke with lab, fungal sensi being sent.  LFTs significantly better. ? Cause, vori Continued efforts to keep patient comfortable Total days of antibiotics- 17 Continue vanco/mycfungin   Johny Sax Infectious Diseases  [phone] 07/15/2012, 11:55 AM   LOS: 3 days

## 2012-07-15 NOTE — Progress Notes (Signed)
Patient very happy to see chaplain. She states her family has not been to see her but her daughter is on the way from "up [place]'. Pt says "it will be better when she gets here" and appears excited at the thought of seeing her daughter. Chaplain provided ministry of presence as well as emotional and spiritual support.  Prayed with patient and will continue to follow up as needed. Patient asked chaplain not to forget to come back because she needs prayer.  Rutherford Nail  Chaplain

## 2012-07-15 NOTE — Progress Notes (Signed)
Subjective: Weak, but no current complaints, eating soft foods.  Objective: Vital signs in last 24 hours: Temp:  [97 F (36.1 C)-97.8 F (36.6 C)] 97.8 F (36.6 C) (01/31 0529) Pulse Rate:  [62-85] 81  (01/31 0529) Resp:  [12-18] 16  (01/31 0529) BP: (83-121)/(53-77) 115/74 mmHg (01/31 0529) SpO2:  [96 %-100 %] 99 % (01/31 0529) Weight:  [49.4 kg (108 lb 14.5 oz)-50.5 kg (111 lb 5.3 oz)] 50.5 kg (111 lb 5.3 oz) (01/30 2058) Weight change: -1.5 kg (-3 lb 4.9 oz)  Intake/Output from previous day: 01/30 0701 - 01/31 0700 In: 390 [P.O.:240; IV Piggyback:150] Out: -186    EXAM: General appearance:  Alert, in no apparent distress Resp:  CTA without rales, rhonchi, or wheezes Cardio:  RRR without murmur or rub GI: + BS, slightly distended with upper abdominal tenderness Extremities:  No edema  Access: Right IJ catheter, old AVG @ [facility]  Lab Results:  Basename 07/15/12 0630 07/14/12 0650  WBC 8.7 6.5  HGB 8.6* 9.0*  HCT 27.9* 28.7*  PLT 129* 168   BMET:  Basename 07/15/12 0630 07/14/12 0650  NA 136 138  K 3.2* 4.2  CL 99 99  CO2 29 27  GLUCOSE 112* 75  BUN 21 52*  CREATININE 2.10* 3.46*  CALCIUM 9.5 9.8  ALBUMIN 1.6* 1.6*   No results found for this basename: PTH:2 in the last 72 hours Iron Studies: No results found for this basename: IRON,TIBC,TRANSFERRIN,FERRITIN in the last 72 hours  Outpatient HD: (From last admit 1 wk ago) [place] [facility] On TTS, 4 hrs, Edw= 53.5 > 51 after last admit, heparin , EPO 1500 units q hd, no vit d last pth=178.2 (05/05/12) Bath= 3.0 k, 2.5 Ca, 1.0 mg. R IJ HD cath  Assessment/Plan: 1. Abdominal pain - Hx chronic pancreatitis, pseudocysts, and recurrent pancreatic abscess; refuses surgery; pancreatic aspirate 1/14 with enterococcus and yeast, on Vancomycin, Fortaz, and Mycamine, per ID. 2. ESRD - HD on TTS @ [facility]; K 3.2 today.  Next HD tomorrow. 3. Anemia - Hgb down to 8.6, on Aranesp 25 mcg on Tues. 4. HTN/Volume - BP 115/74 on Coreg  6.25 mg bid, Hydralazine 25 mg q8h; current wt 50.5 kg, unable to remove fluid yesterday (EDW 53.5). 5. Secondary hyperparathyroidism - Ca 9.5 (11.4 corrected), P 5, iPTH 178 in 04/2012; on Renvela 400 mg with meals.  6. Nutrition - Alb 1.6  7. FTT - on Methadone for chronic pain. 8. Hx CAD/ischemic cardiomyopathy - NSTEMI in 06/2011, echo with EF 10%. 9. Dialysis access - Using right IJ catheter; L arm AVG (placed by Dr. Lyda Perone @ [facility] 10/25) ligated 1/6, after significant L arm swelling secondary to L SCV stenosis and pacemaker; new access to be placed at right arm by Dr. Hollace Hayward was scheduled yesterday, now pending discharge.    LOS: 3 days   LYLES,CHARLES 07/15/2012,9:40 AM  Patient seen and examined.  Agree with assessment and plan as above. Vinson Moselle  MD [facility] [phone] pgr    [phone] cell 07/15/2012, 12:45 PM

## 2012-07-15 NOTE — Progress Notes (Signed)
Palliative Medicine Team Progress Note  Met with "Ms. B" and Velna Hatchetadopted' daughter) and also phoned in son Sidonie Dickens who has formal HCPOA paperwork, and daughter Deanna Artis to further discuss goals of care. We went through the serious nature of her health problems and her limited prognosis but the overarching theme of our discussion was her disposition and that she cannot go back to living independently at home. Everyone, including the patient, accept this-she is open to skilled nursing facility for the level of care she needs. I also discussed my recommendation that palliative care services follow her at SNF as a safety net to determine goals of care at critical junctures- ie. When HD can no longer continue, sepsis progresses or she develops additional complications of her chronic illness.Her pain regimen at home has also been sub-optimal, at SNF this can be better regulated.  Summary of Goals/Major Issues:  1. Disposition: she is agreeable to SNF, this MUST be with strong recommendation in discharge summary for palliative care services to follow her and to a facility where this can actually occur. I educated Sheil on how to advocate for this service at SNF after d/c.  2. Chronic End Stage Health problems: Patient and family understand that her prognosis is very poor for long term survival but Ms. B says she is still in this fight but is exhausted, she wants to take "one day at a time"-she is not ready to transition to full comfort care and EOL care.   Hemodialysis-We discussed that HD has an end and that many patients must stop HD because they can either no longer tolerate it physically or access becomes an issue. At that time she will accept that her death is near but wants to continue to do as much HD as she can handle, and also would like to continue to use her current access until it has been exhausted and if she can survive an attempt at new access she would take that risk- she would defer to vascular  and renal for that assessment.  Cardiac: Noted that she has an ICD placed a year ago at the [place], for now will leave this but she is a DNR, if she develops shockable rhythm would need to deactivate with magnet if death immanent. If it appears she can no longer complete HD or if she has ineffective HD and electrolyte abnormalities that could lead to arrythmia I would at that time call [employer] for deactivation. EF 10%-she is not going to have any exercise tolerance.  Pancreatic Issues: Combination of acute and chronic issues, manage conservatively, pain medication and antibiotics, doubt repeat perc drainage would help or contribute to her QOL, or improve her length of life. Review micro from culture. If IV regimen can be given with HD that would be optimal. She wants treatment trial for infection. Understands this may be chronic since she cannot have surgery, nor does she wants an invasive intervention of that degree.  3. Symptom Management Issues:  Chronic Pain: Multifactorial-Related to intra-abdominal processes, neuropathy. She has been treated with Methadone in past and is now on 10 TID tolerating well, her pain is "much better" she appears quite fatigued but is mostly alert and not overly sedated at present. Would be cautious giving dilaudid for breakthrough with Methadone until her methadone is at steady state which takes about 3-5 days after dose adjustments. She is taking the dilaudid 2mg  every 6 hours for breakthrough.  METHADONE WAS STARTED ON 29th- she was not taking this at home  prior to discharge reliably so we can assume she will be reaching steady state in the next 24-48 hours, if she becomes more somnolent will need to modify dose and hold the dilaudid prns-in general her pain is better today per her report.  Constipation may be major issue- but it appears she has had several BM today, need to monitor this closely. Scheduled senna working.  She is also high risk for decubitus, suggest  mattress overlay and skin care attention.  Thank you for this consultation we will continue support this patient and her family with difficult decisions and with symptom management.  Time: 90 minutes  Anderson Malta, DO Palliative Medicine

## 2012-07-16 LAB — CBC
HCT: 25.9 % — ABNORMAL LOW (ref 36.0–46.0)
MCV: 91.5 fL (ref 78.0–100.0)
Platelets: 124 10*3/uL — ABNORMAL LOW (ref 150–400)
RBC: 2.83 MIL/uL — ABNORMAL LOW (ref 3.87–5.11)
WBC: 12.2 10*3/uL — ABNORMAL HIGH (ref 4.0–10.5)

## 2012-07-16 LAB — COMPREHENSIVE METABOLIC PANEL
Albumin: 1.5 g/dL — ABNORMAL LOW (ref 3.5–5.2)
BUN: 32 mg/dL — ABNORMAL HIGH (ref 6–23)
Calcium: 10 mg/dL (ref 8.4–10.5)
Creatinine, Ser: 2.82 mg/dL — ABNORMAL HIGH (ref 0.50–1.10)
Total Protein: 6.2 g/dL (ref 6.0–8.3)

## 2012-07-16 MED ORDER — HEPARIN SODIUM (PORCINE) 1000 UNIT/ML DIALYSIS: 1000.0000 [IU] | INTRAMUSCULAR | Status: DC | PRN

## 2012-07-16 MED ORDER — SODIUM CHLORIDE 0.9 % IV SOLN: 100.0000 mL | INTRAVENOUS | Status: DC | PRN

## 2012-07-16 MED ORDER — VANCOMYCIN HCL 500 MG IV SOLR
500.0000 mg | Freq: Once | INTRAVENOUS | Status: AC
  Administered 2012-07-16: 500 mg via INTRAVENOUS
  Filled 2012-07-16: qty 500

## 2012-07-16 MED ORDER — ALTEPLASE 2 MG IJ SOLR: 2.0000 mg | Freq: Once | INTRAMUSCULAR | Status: DC | PRN

## 2012-07-16 MED ORDER — PENTAFLUOROPROP-TETRAFLUOROETH EX AERO: 1.0000 "application " | INHALATION_SPRAY | CUTANEOUS | Status: DC | PRN

## 2012-07-16 MED ORDER — HEPARIN SODIUM (PORCINE) 1000 UNIT/ML DIALYSIS: 20.0000 [IU]/kg | INTRAMUSCULAR | Status: DC | PRN

## 2012-07-16 MED ORDER — LIDOCAINE-PRILOCAINE 2.5-2.5 % EX CREA: 1.0000 "application " | TOPICAL_CREAM | CUTANEOUS | Status: DC | PRN

## 2012-07-16 MED ORDER — LIDOCAINE HCL (PF) 1 % IJ SOLN: 5.0000 mL | INTRAMUSCULAR | Status: DC | PRN

## 2012-07-16 MED ORDER — ONDANSETRON HCL 4 MG/2ML IJ SOLN
INTRAMUSCULAR | Status: AC
  Filled 2012-07-16: qty 2

## 2012-07-16 MED ORDER — NEPRO/CARBSTEADY PO LIQD
237.0000 mL | ORAL | Status: DC | PRN
  Filled 2012-07-16: qty 237

## 2012-07-16 NOTE — Progress Notes (Signed)
[facility] PROGRESS NOTE  DON GIARRUSSO QIO:[id number] DOB: 05-10-1951 DOA: 07/12/2012 PCP: Jyl Heinz, MD  Assessment/Plan:  Shortness of breath  - No evidence of pneumonia, pulmonary edema per chest x-ray.  - getting IHD, she appears stable from a volume standpoint - much improved this morning, she is very comfortable on room air, satting 98%  Transaminitis - may be related to her Voriconazole vs congestive hepatopathy 2/2 end stage heart failure vs liver pathology - will d/c that and switch to Micafungin. ID consulted, appreciate further input.  - LFTs continue to improve off of Voriconazole  Cirrhosis  - decompensated currently - ascites seems that is bothering patient.  - will ask for US guided para with labs  Infected hemorrhagic pancreatic pseudocyst  - Status post aspiration on 06/28/2012, aspirate grew enterococcus and yeast.  - Patient is on Fortaz, vancomycin and voriconazole at home.  - This is being chronic problem, seen by general surgery earlier this month and she refused surgical excision.  - ID following here, currently on Micafungin and Vancomycin with HD  ESRD  - On dialysis on Tuesday, Thursday and Saturday.  - nephrology following, appreciate their input  Hyperkalemia  - on IHD  Chronic systolic CHF, severe  -With LVEF of 10% on 05/08/2012 by 2-D echocardiogram.  -Has Lorcet edema and orthopnea, no significant pulmonary edema or pleural effusion.  -She is on Coreg, Imdur and hydralazine.  -She has ICD in place, she has LBBB  Chronic abdominal pain  -She is on methadone and oral Dilaudid. Chronic abdominal pain secondary to chronic pancreatic bed problems.  -Had long standing history of chronic pancreatitis and infected pancreatic pseudocyst  - will attempt for paracentesis, hopefully that will improve her abdominal pain somewhat  Disposition  -Patient said she is very weak, to the point she couldn't walk.  - PT recommended SNF on  discharge, patient would like [facility] as this will be close to family.   Code Status: DNR/DNI Family Communication: none  Disposition Plan: SNF discharge once Abx plan in place, paracentesis done. Hopefully Tuesday.   Consultants:  Nephrology  ID  Procedures:  none  Antibiotics (started last hospitalization ~06/29/12 with plans for a month)  Vancomycin 1/15 >>  Ceftazidime 1/15>>1/30  Voriconazole stopped 1/30  Micafungin 1/30 >>  HPI/Subjective: - in much better spirits this morning  Objective: Filed Vitals:   07/16/12 1000 07/16/12 1030 07/16/12 1040 07/16/12 1141  BP: 93/54 97/60 106/59 110/62  Pulse: 71 74 69 64  Temp:   97.7 F (36.5 C)   TempSrc:   Oral   Resp: 22 12 14    Height:      Weight:   49.5 kg (109 lb 2 oz)   SpO2:   98%     Intake/Output Summary (Last 24 hours) at 07/16/12 1358 Last data filed at 07/16/12 1324  Gross per 24 hour  Intake    520 ml  Output   1000 ml  Net   -480 ml   Filed Weights   07/15/12 2233 07/16/12 0723 07/16/12 1040  Weight: 50.4 kg (111 lb 1.8 oz) 50.7 kg (111 lb 12.4 oz) 49.5 kg (109 lb 2 oz)    Exam:   General:  Frail appearing AA woman  Cardiovascular: RRR  Respiratory: CTA biL, no crackles  Abdomen: soft, NTTP, no obvious fluid wave  MSK - trace edema  Data Reviewed: Basic Metabolic Panel:  Lab 07/16/12 3244 07/15/12 0630 07/14/12 0650 07/13/12 1344 07/12/12 1240  NA  134* 136 138 141 133*  K 3.4* 3.2* 4.2 3.5 6.3*  CL 97 99 99 100 103  CO2 28 29 27 26  --  GLUCOSE 117* 112* 75 105* 86  BUN 32* 21 52* 50* 103*  CREATININE 2.82* 2.10* 3.46* 2.96* 5.60*  CALCIUM 10.0 9.5 9.8 9.5 --  MG -- -- -- -- --  PHOS -- -- -- 5.0* --    Lab 07/12/12 1341  LIPASE 29  AMYLASE --   CBC:  Lab 07/16/12 0740 07/15/12 0630 07/14/12 0650 07/13/12 1344 07/12/12 1240 07/12/12 1213  WBC 12.2* 8.7 6.5 14.0* -- 18.7*  NEUTROABS -- -- -- -- -- --  HGB 8.0* 8.6* 9.0* 10.5* 10.2* --  HCT 25.9* 27.9* 28.7*  31.3* 30.0* --  MCV 91.5 89.7 87.2 86.2 -- 88.6  PLT 124* 129* 168 251 -- 318   BNP (last 3 results)  Basename 06/02/12 1944 05/08/12 0515 04/21/12 1419  PROBNP >70000.0* >70000.0* >70000.0*   CBG:  Lab 07/14/12 2100 07/14/12 1642 07/14/12 0734 07/13/12 2036 07/13/12 1659  GLUCAP 109* 126* 82 109* 83    Recent Results (from the past 240 hour(s))  URINE CULTURE     Status: Normal   Collection Time   07/12/12  1:32 PM      Component Value Range Status Comment   Specimen Description URINE, CATHETERIZED   Final    Special Requests NONE   Final    Culture  Setup Time 07/12/2012 14:21   Final    Colony Count NO GROWTH   Final    Culture NO GROWTH   Final    Report Status 07/13/2012 FINAL   Final      Studies: US Abdomen Complete  07/14/2012  *RADIOLOGY REPORT*  Clinical Data:  62 year old female with abdominal pain and elevated LFTs.  History of cholecystectomy, cirrhosis, chronic pancreatitis, cardiomyopathy and end-stage renal disease.  ABDOMINAL ULTRASOUND COMPLETE  Comparison:  07/12/2012 and prior CTs.  Findings:  Gallbladder:  The gallbladder is not visualized compatible with cholecystectomy.  Common Bile Duct:  There is no evidence of intrahepatic or extrahepatic biliary dilation. The CBD measures 6.0 mm in greatest diameter.  Liver:  The liver is heterogeneous in echotexture with increased echogenicity. No focal abnormalities are identified.  IVC:  Appears normal.  Pancreas: A cyst within the pancreatic tail appears unchanged.  Spleen:  Within normal limits in size and echotexture.  Right kidney: The right kidney is atrophic and diffusely echogenic measuring 7 cm.  There is no evidence of hydronephrosis or definite renal mass.  Left kidney: The left kidney is atrophic and diffusely echogenic measuring 7 cm.  There is no evidence of hydronephrosis or definite renal mass. Several cysts are present.  Abdominal Aorta:  No abdominal aortic aneurysm identified.  Ascites and bilateral pleural  effusions are noted.  IMPRESSION:  Heterogeneous and increased echogenicity of the liver which can be seen with this patient's history of cirrhosis and fatty infiltration.  Chronic medical renal disease.  Ascites and bilateral pleural effusions.   Original Report Authenticated By: Harmon Pier, M.D.     Scheduled Meds:    . aspirin EC  81 mg Oral Daily  . carvedilol  6.25 mg Oral BID WC  . darbepoetin  25 mcg Intravenous Q Tue-HD  . feeding supplement (NEPRO CARB STEADY)  237 mL Oral BID WC  . heparin  5,000 Units Subcutaneous Q8H  . hydrALAZINE  25 mg Oral Q8H  . isosorbide mononitrate  30 mg Oral Daily  .  methadone  10 mg Oral TID  . micafungin (MYCAMINE) IV  100 mg Intravenous Q24H  . multivitamin  1 tablet Oral Daily  . ondansetron      . pantoprazole  40 mg Oral Daily  . ranolazine  500 mg Oral Daily  . senna-docusate  1 tablet Oral BID  . sevelamer  400 mg Oral TID WC  . sodium chloride  3 mL Intravenous Q12H   Continuous Infusions:   Active Problems:  Ischemic cardiomyopathy, EF 20-25% echo April 2013, now 10% by cath (05/06/12)  Chronic systolic congestive heart failure, NYHA class 3  LBBB (left bundle branch block)  Pancreatitis, chronic  ESRD on hemodialysis  Hyperkalemia  Complication of vascular access for dialysis  Pain in the abdomen  Failure to thrive in adult  Constipation    Pamella Pert  [facility] Pager [phone]. If 7PM-7AM, please contact night-coverage at [email or web address], password [facility] 07/16/2012, 1:58 PM  LOS: 4 days

## 2012-07-16 NOTE — Progress Notes (Signed)
Checked in with patient on whether she would like to receive some counseling today. Patient stated that she is not feeling well today and is in pain. A nurse was notified about client's pain. Patient requested rescheduling our session for next week.   Sherol Dade Counseling Intern Haroldine Laws

## 2012-07-16 NOTE — Progress Notes (Signed)
Subjective:   Unable to sleep last night, uncomfortable in bed, much better now sitting in chair; no recent nausea, tolerating soft foods.  Objective: Vital signs in last 24 hours: Temp:  [97.4 F (36.3 C)-98.7 F (37.1 C)] 98.5 F (36.9 C) (02/01 0458) Pulse Rate:  [76-82] 79  (02/01 0458) Resp:  [15-17] 17  (02/01 0458) BP: (92-106)/(62-74) 98/62 mmHg (02/01 0458) SpO2:  [95 %-98 %] 95 % (02/01 0458) Weight:  [50.4 kg (111 lb 1.8 oz)] 50.4 kg (111 lb 1.8 oz) (01/31 2233) Weight change: 1 kg (2 lb 3.3 oz)  Intake/Output from previous day: 01/31 0701 - 02/01 0700 In: 560 [P.O.:360; IV Piggyback:200] Out: -  Intake/Output this shift: Total I/O In: 220 [P.O.:120; IV Piggyback:100] Out: -  EXAM: General appearance:  Alert, in no apparent distress Resp:  CTA without rales, rhonchi, or wheezes Cardio:  RRR without murmur or rub GI:  + BS, soft and currently nontender Extremities:  No edema Access:  Right IJ catheter, old AVG @ LUA  Lab Results:  Basename 07/15/12 0630 07/14/12 0650  WBC 8.7 6.5  HGB 8.6* 9.0*  HCT 27.9* 28.7*  PLT 129* 168   BMET:  Basename 07/15/12 0630 07/14/12 0650  NA 136 138  K 3.2* 4.2  CL 99 99  CO2 29 27  GLUCOSE 112* 75  BUN 21 52*  CREATININE 2.10* 3.46*  CALCIUM 9.5 9.8  ALBUMIN 1.6* 1.6*   No results found for this basename: PTH:2 in the last 72 hours Iron Studies: No results found for this basename: IRON,TIBC,TRANSFERRIN,FERRITIN in the last 72 hours  Outpatient HD: (From last admit 1 wk ago) [place] [facility] On TTS, 4 hrs, Edw= 53.5 > 51 after last admit, heparin , EPO 1500 units q hd, no vit d last pth=178.2 (05/05/12) Bath= 3.0 k, 2.5 Ca, 1.0 mg. R IJ HD cath  Assessment/Plan: 1. Abdominal pain - Hx chronic pancreatitis, pseudocysts, and recurrent pancreatic abscess; refuses surgery; pancreatic aspirate 1/14 with enterococcus and yeast, on Vancomycin and Mycamine, per ID. 2. ESRD - HD on TTS @ [facility]; K 3.2 yesterday. Next HD  today. 3. Anemia - Hgb down to 8.6, on Aranesp 25 mcg on Tues. 4. HTN/Volume - BP 98/62 on Coreg 6.25 mg bid, Hydralazine 25 mg q8h; current wt 50.4 kg, unable to remove fluid on 1/30 (EDW 53.5). 5. Secondary hyperparathyroidism - Ca 9.5 (11.4 corrected), P 5, iPTH 178 in 04/2012; on Renvela 400 mg with meals.  6. Nutrition - Alb 1.6  7. FTT - on Methadone for chronic pain. 8. Hx CAD/ischemic cardiomyopathy - NSTEMI in 06/2011, echo with EF 10%. 9. Dialysis access - Using right IJ catheter; L arm AVG ligated 1/6 due to L arm swelling from L SCV stenosis and pacemaker; new access to be placed at right arm by Dr. Hollace Hayward was scheduled for yesterday, now pending discharge 10. DNR- see also palliative care notes, appreciate help   LOS: 4 days   LYLES,CHARLES 07/16/2012,6:57 AM  Patient seen and examined.  Agree with assessment and plan as above. Vinson Moselle  MD [employer] [phone] pgr    [phone] cell 07/16/2012, 8:53 AM

## 2012-07-16 NOTE — Procedures (Signed)
I was present at this dialysis session. I have reviewed the session itself and made appropriate changes.   Vinson Moselle, MD [employer] 07/16/2012, 8:50 AM

## 2012-07-17 ENCOUNTER — Inpatient Hospital Stay (HOSPITAL_COMMUNITY): Payer: Non-veteran care

## 2012-07-17 LAB — GLUCOSE, CAPILLARY: Glucose-Capillary: 133 mg/dL — ABNORMAL HIGH (ref 70–99)

## 2012-07-17 LAB — BODY FLUID CELL COUNT WITH DIFFERENTIAL
Lymphs, Fluid: 17 %
Neutrophil Count, Fluid: 33 % — ABNORMAL HIGH (ref 0–25)

## 2012-07-17 MED ORDER — DARBEPOETIN ALFA-POLYSORBATE 25 MCG/0.42ML IJ SOLN
200.0000 ug | INTRAMUSCULAR | Status: DC
  Filled 2012-07-17: qty 3.36

## 2012-07-17 NOTE — Progress Notes (Signed)
[facility] PROGRESS NOTE  MABELL ESGUERRA VWU:[id number] DOB: 10/03/1950 DOA: 07/12/2012 PCP: Jyl Heinz, MD  Assessment/Plan:  Shortness of breath  - No evidence of pneumonia, pulmonary edema per chest x-ray.  - getting IHD, she appears stable from a volume standpoint - continues to be stable and breathing comfortably on room air  Transaminitis - may be related to her Voriconazole vs congestive hepatopathy 2/2 end stage heart failure vs liver pathology - will d/c that and switch to Micafungin. ID consulted, appreciate further input.  - LFTs continue to improve off of Voriconazole  Cirrhosis  - decompensated currently - ascites seems that is bothering patient.  - US guided paracentesis this morning, patient tolerated procedure well. Will follow up with fluid profile and cultures. No suspicion for SBP.   Infected hemorrhagic pancreatic pseudocyst  - Status post aspiration on 06/28/2012, aspirate grew enterococcus and yeast.  - Patient is on Fortaz, vancomycin and voriconazole at home.  - This is being chronic problem, seen by general surgery earlier this month and she refused surgical excision.  - ID following here, currently on Micafungin and Vancomycin with HD  ESRD  - On dialysis on Tuesday, Thursday and Saturday.  - nephrology following, appreciate their input  Hyperkalemia  - on IHD  Chronic systolic CHF, severe  -With LVEF of 10% on 05/08/2012 by 2-D echocardiogram.  -Has Lorcet edema and orthopnea, no significant pulmonary edema or pleural effusion.  -She is on Coreg, Imdur and hydralazine.  -She has ICD in place, she has LBBB  Chronic abdominal pain  -She is on methadone and oral Dilaudid. Chronic abdominal pain secondary to chronic pancreatic bed problems.  -Had long standing history of chronic pancreatitis and infected pancreatic pseudocyst  - pain has improved with para this morning  Disposition  -Patient said she is very weak, to the point she  couldn't walk.  - PT recommended SNF on discharge, patient would like [facility] as this will be close to family.   Code Status: DNR/DNI Family Communication: none  Disposition Plan: SNF discharge once Abx plan in place, paracentesis done. Hopefully Tuesday.   Consultants:  Nephrology  ID  Procedures:  none  Antibiotics (started last hospitalization ~06/29/12 with plans for a month)  Vancomycin 1/15 >>  Ceftazidime 1/15>>1/30  Voriconazole stopped 1/30  Micafungin 1/30 >>  HPI/Subjective: - no complaints, taking it "a day at the time"   Objective: Filed Vitals:   07/17/12 1035 07/17/12 1042 07/17/12 1046 07/17/12 1301  BP: 108/65 100/61 104/60 104/70  Pulse:    69  Temp:    97.8 F (36.6 C)  TempSrc:    Oral  Resp:    17  Height:      Weight:      SpO2:    98%    Intake/Output Summary (Last 24 hours) at 07/17/12 1317 Last data filed at 07/17/12 0548  Gross per 24 hour  Intake    940 ml  Output      0 ml  Net    940 ml   Filed Weights   07/16/12 0723 07/16/12 1040 07/16/12 2125  Weight: 50.7 kg (111 lb 12.4 oz) 49.5 kg (109 lb 2 oz) 49.499 kg (109 lb 2 oz)    Exam:   General:  Frail appearing AA woman  Cardiovascular: RRR  Respiratory: CTA biL, no crackles  Abdomen: soft, NTTP. Paracentesis site clean without bleeding or leaks.   MSK - trace edema  Data Reviewed: Basic Metabolic Panel:  Lab 07/16/12 0740 07/15/12 0630 07/14/12 0650 07/13/12 1344 07/12/12 1240  NA 134* 136 138 141 133*  K 3.4* 3.2* 4.2 3.5 6.3*  CL 97 99 99 100 103  CO2 28 29 27 26  --  GLUCOSE 117* 112* 75 105* 86  BUN 32* 21 52* 50* 103*  CREATININE 2.82* 2.10* 3.46* 2.96* 5.60*  CALCIUM 10.0 9.5 9.8 9.5 --  MG -- -- -- -- --  PHOS -- -- -- 5.0* --    Lab 07/12/12 1341  LIPASE 29  AMYLASE --   CBC:  Lab 07/16/12 0740 07/15/12 0630 07/14/12 0650 07/13/12 1344 07/12/12 1240 07/12/12 1213  WBC 12.2* 8.7 6.5 14.0* -- 18.7*  NEUTROABS -- -- -- -- -- --  HGB 8.0*  8.6* 9.0* 10.5* 10.2* --  HCT 25.9* 27.9* 28.7* 31.3* 30.0* --  MCV 91.5 89.7 87.2 86.2 -- 88.6  PLT 124* 129* 168 251 -- 318   BNP (last 3 results)  Basename 06/02/12 1944 05/08/12 0515 04/21/12 1419  PROBNP >70000.0* >70000.0* >70000.0*   CBG:  Lab 07/14/12 2100 07/14/12 1642 07/14/12 0734 07/13/12 2036 07/13/12 1659  GLUCAP 109* 126* 82 109* 83    Recent Results (from the past 240 hour(s))  URINE CULTURE     Status: Normal   Collection Time   07/12/12  1:32 PM      Component Value Range Status Comment   Specimen Description URINE, CATHETERIZED   Final    Special Requests NONE   Final    Culture  Setup Time 07/12/2012 14:21   Final    Colony Count NO GROWTH   Final    Culture NO GROWTH   Final    Report Status 07/13/2012 FINAL   Final      Studies: US Paracentesis  07/17/2012  *RADIOLOGY REPORT*  Clinical data: Cirrhosis, recurrent ascites, abdominal distention.  ULTRASOUND GUIDED PARACENTESIS  Technique: Survey ultrasound of the abdomen was performed and an appropriate skin entry site in the right upper abdomen was selected. Skin site was marked, prepped with Betadine, and draped in usual sterile fashion, and infiltrated locally with 1% lidocaine. A 5 French multisidehole Yueh sheath needle was advanced into the peritoneal space until fluid could be aspirated. The sheath was advanced and the needle removed. 2.1 liters of slightly cloudy yellow thin ascites were aspirated. No immediate complication.  IMPRESSION Technically successful ultrasound guided paracentesis, removing 2.1 liters  of ascites   Original Report Authenticated By: D. Andria Rhein, MD     Scheduled Meds:    . aspirin EC  81 mg Oral Daily  . carvedilol  6.25 mg Oral BID WC  . darbepoetin  200 mcg Intravenous Q Tue-HD  . feeding supplement (NEPRO CARB STEADY)  237 mL Oral BID WC  . heparin  5,000 Units Subcutaneous Q8H  . hydrALAZINE  25 mg Oral Q8H  . isosorbide mononitrate  30 mg Oral Daily  . methadone  10  mg Oral TID  . micafungin (MYCAMINE) IV  100 mg Intravenous Q24H  . multivitamin  1 tablet Oral Daily  . pantoprazole  40 mg Oral Daily  . ranolazine  500 mg Oral Daily  . senna-docusate  1 tablet Oral BID  . sevelamer  400 mg Oral TID WC  . sodium chloride  3 mL Intravenous Q12H   Continuous Infusions:   Active Problems:  Ischemic cardiomyopathy, EF 20-25% echo April 2013, now 10% by cath (05/06/12)  Chronic systolic congestive heart failure, NYHA class 3  LBBB (  left bundle branch block)  Pancreatitis, chronic  ESRD on hemodialysis  Hyperkalemia  Complication of vascular access for dialysis  Pain in the abdomen  Failure to thrive in adult  Constipation  Pamella Pert  [facility] Pager [phone]. If 7PM-7AM, please contact night-coverage at [email or web address], password [facility] 07/17/2012, 1:17 PM  LOS: 5 days

## 2012-07-17 NOTE — Procedures (Signed)
US paracentesis RUQ cloudy yellow for labs No complication No blood loss. See complete dictation in [facility].

## 2012-07-17 NOTE — Progress Notes (Signed)
Subjective:   No current complaints, sitting in chair, no recent abdominal pain, but 2-3 BMs yesterday, all loose.  Objective: Vital signs in last 24 hours: Temp:  [97.7 F (36.5 C)-99 F (37.2 C)] 98.2 F (36.8 C) (02/02 0547) Pulse Rate:  [64-87] 81  (02/02 0547) Resp:  [12-22] 16  (02/02 0547) BP: (89-128)/(54-74) 109/67 mmHg (02/02 0547) SpO2:  [95 %-98 %] 96 % (02/02 0547) Weight:  [49.499 kg (109 lb 2 oz)-50.7 kg (111 lb 12.4 oz)] 49.499 kg (109 lb 2 oz) (02/01 2125) Weight change: 0.3 kg (10.6 oz)  Intake/Output from previous day: 02/01 0701 - 02/02 0700 In: 940 [P.O.:940] Out: 1000    EXAM: General appearance: Alert, in no apparent distress Resp:  CTA without rales, rhonchi, or wheezes Cardio:  RRR without murmur or rub GI:  + BS, soft and nontender Extremities:  No edema Access:  Right IJ catheter, old  AVG @ LUA  Lab Results:  Basename 07/16/12 0740 07/15/12 0630  WBC 12.2* 8.7  HGB 8.0* 8.6*  HCT 25.9* 27.9*  PLT 124* 129*   BMET:  Basename 07/16/12 0740 07/15/12 0630  NA 134* 136  K 3.4* 3.2*  CL 97 99  CO2 28 29  GLUCOSE 117* 112*  BUN 32* 21  CREATININE 2.82* 2.10*  CALCIUM 10.0 9.5  ALBUMIN 1.5* 1.6*   No results found for this basename: PTH:2 in the last 72 hours Iron Studies: No results found for this basename: IRON,TIBC,TRANSFERRIN,FERRITIN in the last 72 hours  Outpatient HD: (From last admit 1 wk ago) [place] [facility] On TTS, 4 hrs, Edw= 53.5 > 51 after last admit, heparin , EPO 1500 units q hd, no vit d last pth=178.2 (05/05/12) Bath= 3.0 k, 2.5 Ca, 1.0 mg. R IJ HD cath  Assessment/Plan: 1. Abdominal pain - Hx chronic pancreatitis, pseudocysts, and recurrent pancreatic abscess; refuses surgery; pancreatic aspirate 1/14 with enterococcus and yeast, s/p Vancomycin, now only Mycamine, per ID. 2. ESRD - HD on TTS @ [facility]; K 3.4 yesterday. Next HD 2/4. 3. Anemia - Hgb down to 8, on Aranesp 25 mcg on Tues.  Increase Aranesp to 200 mcg, check CBC  tomorrow, transfusion if necessary. 4. HTN/Volume - BP 109/67 on Coreg 6.25 mg bid, Hydralazine 25 mg q8h; current wt 49.5 kg s/p net UF 1 L yesterday  (EDW 53.5). 5. Secondary hyperparathyroidism - Ca 10 (12 corrected), P 5, iPTH 178 in 04/2012; on Renvela 400 mg with meals.  Use 2Ca bath. 6. Nutrition - Alb 1.5.  7. FTT - on Methadone for chronic pain. 8. Hx CAD/ischemic cardiomyopathy - NSTEMI in 06/2011, echo with EF 10%. 9. Dialysis access - Using right IJ catheter; L arm AVG ligated 1/6 due to L arm swelling from L SCV stenosis and pacemaker; new access to be placed at right arm by Dr. Hollace Hayward was scheduled for 1/29, now pending discharge 10. DNR- see also palliative care notes    LOS: 5 days   LYLES,CHARLES 07/17/2012,7:08 AM  Patient seen and examined.  Agree with assessment and plan as above. Vinson Moselle  MD [employer] [phone] [phone]    [phone] cell 07/17/2012, 1:43 PM

## 2012-07-18 DIAGNOSIS — Z9119 Patient's noncompliance with other medical treatment and regimen: Secondary | ICD-10-CM

## 2012-07-18 DIAGNOSIS — N19 Unspecified kidney failure: Secondary | ICD-10-CM

## 2012-07-18 DIAGNOSIS — K859 Acute pancreatitis without necrosis or infection, unspecified: Secondary | ICD-10-CM

## 2012-07-18 DIAGNOSIS — K658 Other peritonitis: Secondary | ICD-10-CM

## 2012-07-18 LAB — CBC
HCT: 31.5 % — ABNORMAL LOW (ref 36.0–46.0)
Hemoglobin: 9.7 g/dL — ABNORMAL LOW (ref 12.0–15.0)
MCH: 28.1 pg (ref 26.0–34.0)
MCHC: 30.8 g/dL (ref 30.0–36.0)

## 2012-07-18 LAB — COMPREHENSIVE METABOLIC PANEL
Alkaline Phosphatase: 174 U/L — ABNORMAL HIGH (ref 39–117)
BUN: 31 mg/dL — ABNORMAL HIGH (ref 6–23)
GFR calc Af Amer: 20 mL/min — ABNORMAL LOW (ref >90)
GFR calc non Af Amer: 17 mL/min — ABNORMAL LOW (ref >90)
Glucose, Bld: 90 mg/dL (ref 70–99)
Potassium: 3.3 mEq/L — ABNORMAL LOW (ref 3.5–5.1)
Total Protein: 6.6 g/dL (ref 6.0–8.3)

## 2012-07-18 MED ORDER — VANCOMYCIN HCL 500 MG IV SOLR
500.0000 mg | INTRAVENOUS | Status: DC
  Administered 2012-07-19: 500 mg via INTRAVENOUS
  Filled 2012-07-18: qty 500

## 2012-07-18 MED ORDER — CARVEDILOL 3.125 MG PO TABS
3.1250 mg | ORAL_TABLET | Freq: Two times a day (BID) | ORAL | Status: DC
  Administered 2012-07-18 – 2012-07-20 (×4): 3.125 mg via ORAL
  Filled 2012-07-18 (×6): qty 1

## 2012-07-18 NOTE — Progress Notes (Signed)
ANTIBIOTIC CONSULT NOTE - FOLLOW UP  Pharmacy Consult for Vancomycin Indication: Infected pancreatic pseudocyst/abscess   Allergies  Allergen Reactions  . Codeine Itching  . Morphine And Related Shortness Of Breath    SOB when given IV once "to me it was mild; I called out fast for help; never had to intubate" (05/23/2012)  . Ace Inhibitors Other (See Comments)    Unknown reaction but her physician stated that she can't take it (specifically lisinopril)  . Tylenol (Acetaminophen) Other (See Comments)    Patient states that the doctor told her she has a Fatty Liver.    Patient Measurements: Height: 5\' 4"  (162.6 cm) Weight: 110 lb 5 oz (50.037 kg) IBW/kg (Calculated) : 54.7   Vital Signs: Temp: 98.4 F (36.9 C) (02/03 0730) Temp src: Oral (02/03 0730) BP: 113/74 mmHg (02/03 0730) Pulse Rate: 71  (02/03 0730) Intake/Output from previous day: 02/02 0701 - 02/03 0700 In: 954 [P.O.:480] Out: -  Intake/Output from this shift: Total I/O In: 240 [P.O.:240] Out: 0   Labs:  [facility] 07/18/12 0550 07/16/12 0740  WBC 8.2 12.2*  HGB 9.7* 8.0*  PLT 105* 124*  LABCREA -- --  CREATININE 2.75* 2.82*   Estimated Creatinine Clearance: 16.5 ml/min (by C-G formula based on Cr of 2.75). No results found for this basename: VANCOTROUGH:2,VANCOPEAK:2,VANCORANDOM:2,GENTTROUGH:2,GENTPEAK:2,GENTRANDOM:2,TOBRATROUGH:2,TOBRAPEAK:2,TOBRARND:2,AMIKACINPEAK:2,AMIKACINTROU:2,AMIKACIN:2, in the last 72 hours   Microbiology: Recent Results (from the past 720 hour(s))  MRSA PCR SCREENING     Status: Normal   Collection Time   06/26/12  3:20 AM      Component Value Range Status Comment   MRSA by PCR NEGATIVE  NEGATIVE Final   URINE CULTURE     Status: Normal   Collection Time   06/27/12  6:36 AM      Component Value Range Status Comment   Specimen Description URINE, RANDOM   Final    Special Requests NONE   Final    Culture  Setup Time 06/27/2012 16:26   Final    Colony Count 40,000 COLONIES/ML    Final    Culture     Final    Value: Multiple bacterial morphotypes present, none predominant. Suggest appropriate recollection if clinically indicated.   Report Status 06/28/2012 FINAL   Final   CULTURE, ROUTINE-ABSCESS     Status: Normal   Collection Time   06/28/12  4:26 PM      Component Value Range Status Comment   Specimen Description ABSCESS   Final    Special Requests LEFT RETROPERITONEAL FLUID   Final    Gram Stain     Final    Value: ABUNDANT WBC PRESENT,BOTH PMN AND MONONUCLEAR     FEW YEAST   Culture     Final    Value: FEW ENTEROCOCCUS SPECIES     FEW YEAST CONSISTENT WITH CANDIDA SPECIES   Report Status 07/05/2012 FINAL   Final    Organism ID, Bacteria ENTEROCOCCUS SPECIES   Final   ANAEROBIC CULTURE     Status: Normal   Collection Time   06/28/12  4:26 PM      Component Value Range Status Comment   Specimen Description ABSCESS   Final    Special Requests LEFT RETROPERITONEAL FLUID   Final    Gram Stain     Final    Value: ABUNDANT WBC PRESENT,BOTH PMN AND MONONUCLEAR     FEW YEAST   Culture NO ANAEROBES ISOLATED   Final    Report Status 07/03/2012 FINAL   Final  FUNGUS CULTURE W SMEAR     Status: Normal (Preliminary result)   Collection Time   06/28/12  4:26 PM      Component Value Range Status Comment   Specimen Description ABSCESS   Final    Special Requests LEFT RETROPERITONEAL FLUID   Final    Fungal Smear NO YEAST OR FUNGAL ELEMENTS SEEN   Final    Culture YEAST ISOLATED;ID TO FOLLOW   Final    Report Status PENDING   Incomplete   URINE CULTURE     Status: Normal   Collection Time   07/12/12  1:32 PM      Component Value Range Status Comment   Specimen Description URINE, CATHETERIZED   Final    Special Requests NONE   Final    Culture  Setup Time 07/12/2012 14:21   Final    Colony Count NO GROWTH   Final    Culture NO GROWTH   Final    Report Status 07/13/2012 FINAL   Final   BODY FLUID CULTURE     Status: Normal (Preliminary result)   Collection  Time   07/17/12 10:47 AM      Component Value Range Status Comment   Specimen Description ASCITIC   Final    Special Requests NONE   Final    Gram Stain     Final    Value: CYTOSPIN NO WBC SEEN     NO ORGANISMS SEEN   Culture NO GROWTH 1 DAY   Final    Report Status PENDING   Incomplete     Anti-infectives     Start     Dose/Rate Route Frequency Ordered Stop   07/19/12 1200   vancomycin (VANCOCIN) 500 mg in sodium chloride 0.9 % 100 mL IVPB        500 mg 100 mL/hr over 60 Minutes Intravenous Every T-Th-Sa (Hemodialysis) 07/18/12 1029     07/16/12 1500   vancomycin (VANCOCIN) 500 mg in sodium chloride 0.9 % 100 mL IVPB        500 mg 100 mL/hr over 60 Minutes Intravenous  Once 07/16/12 1420 07/16/12 1627   07/15/12 1000   vancomycin (VANCOCIN) 500 mg in sodium chloride 0.9 % 100 mL IVPB        500 mg 100 mL/hr over 60 Minutes Intravenous  Once 07/15/12 0804 07/15/12 1227   07/14/12 2000   micafungin (MYCAMINE) 100 mg in sodium chloride 0.9 % 100 mL IVPB        100 mg 100 mL/hr over 1 Hours Intravenous Every 24 hours 07/14/12 1007     07/14/12 1800   cefTAZidime (FORTAZ) 2 g in dextrose 5 % 50 mL IVPB  Status:  Discontinued        2 g 100 mL/hr over 30 Minutes Intravenous Every T-Th-Sa (1800) 07/14/12 0952 07/15/12 1031   07/14/12 1200   vancomycin (VANCOCIN) 500 mg in sodium chloride 0.9 % 100 mL IVPB        500 mg 100 mL/hr over 60 Minutes Intravenous Every Thu (Hemodialysis) 07/14/12 0909 07/14/12 1153   07/13/12 1800   cefTAZidime (FORTAZ) 2 g in dextrose 5 % 50 mL IVPB        2 g 100 mL/hr over 30 Minutes Intravenous  Once 07/13/12 1132 07/13/12 1857   07/13/12 1630   voriconazole (VFEND) tablet 200 mg  Status:  Discontinued        200 mg Oral 2 times daily before meals 07/13/12 1124 07/14/12 1006  07/13/12 1600   vancomycin (VANCOCIN) 500 mg in sodium chloride 0.9 % 100 mL IVPB        500 mg 100 mL/hr over 60 Minutes Intravenous To Hemodialysis 07/13/12 1132 07/13/12  1518   07/13/12 0959   voriconazole (VFEND) tablet 200 mg  Status:  Discontinued        200 mg Oral 2 times daily 07/13/12 0959 07/13/12 1124   07/12/12 1545   cefTRIAXone (ROCEPHIN) 1 g in dextrose 5 % 50 mL IVPB        1 g 100 mL/hr over 30 Minutes Intravenous  Once 07/12/12 1534 07/12/12 1643          Assessment: 62 y.o F with ESRD resumed on Vancomycin initiated on 06/29/12 during an earlier admission this month for an infected pancreatic pseudocyst. The patient was transitioned from Micafungin to Voriconazole upon discharge on 07/07/12 however has been noted to have a large elevated in LFTs (AST/ALT 945/358 on 07/14/12, baseline 14/5 on 06/25/12), therefore Voriconazole was d/ced and changed back to Micafungin on 07/14/12.  The patient is noted to have cultures positive for Enterococcus (S-amp, Vanc) and yeast (sensitivities pending). Per ID -- to continue Micafungin (unless LFTs continue to rise) and Vancomycin -- ID stopped Ceftazidime.The patient's LFTs show a trend downward today, AST/ALT 393/263 << 945/358.   The patient receives HD on T/Th/Sat with a last HD session on 07/16/12 (4 hr BFR 400). Last pre-hd level below goal, but received extra dose to correct.  Goal of Therapy:  Pre-HD Vancomycin level 15-25 mcg/ml  Plan:  Continue Vancomycin 500 mg post HD sessions on T/Th/Sat Will continue to follow HD schedule/duration, culture results, LOT, and antibiotic de-escalation plans   Verlene Mayer, PharmD, [place] Pager [phone] 07/18/2012 10:32 AM

## 2012-07-18 NOTE — Clinical Social Work Note (Signed)
CSW talked with [name], SW at [facility] regarding patient and ST rehab. Patient is 30% disabled so not eligible to be considered for [place] in [place], however patient does have Medicare Part A per their records. CSW contacted [facility] and verified same. Patient's clinical information resent to SNF's informing them of Medicare payor source. Patient had not gotten any bed offers with [facility] as payor. CSW will follow-up with any SNF's that made bed offers to assure they can take patient with peripheral IV for the antibiotic medication she is taking.  Genelle Bal, MSW, LCSW [phone]

## 2012-07-18 NOTE — Progress Notes (Signed)
[facility] PROGRESS NOTE  [name] ZOX:[id number] DOB: 04/26/1951 DOA: 07/12/2012 PCP: Jyl Heinz, MD  Assessment/Plan:  Shortness of breath  - No evidence of pneumonia, pulmonary edema per chest x-ray.  - getting IHD, she appears stable from a volume standpoint - continues to be stable and breathing comfortably on room air  Transaminitis - may be related to her Voriconazole vs congestive hepatopathy 2/2 end stage heart failure vs liver pathology - will d/c that and switch to Micafungin. ID consulted, appreciate further input.  - LFTs continue to improve off of Voriconazole, continue Micafungin for now. Appreciate ID input for Abx choice.   Cirrhosis  - decompensated currently - ascites seems that is bothering patient.  - US guided paracentesis on 07/18/12, patient tolerated procedure well.   Infected hemorrhagic pancreatic pseudocyst  - Status post aspiration on 06/28/2012, aspirate grew enterococcus and yeast.  - Patient is on Fortaz, vancomycin and voriconazole at home.  - This is being chronic problem, seen by general surgery earlier this month and she refused surgical excision.  - ID following here, currently on Micafungin and Vancomycin with HD  ESRD  - On dialysis on Tuesday, Thursday and Saturday.  - nephrology following, appreciate their input  Hyperkalemia  - on IHD  Chronic systolic CHF, severe  -With LVEF of 10% on 05/08/2012 by 2-D echocardiogram.  -Has edema and orthopnea, no significant pulmonary edema or pleural effusion.  -She is on Coreg, Imdur and hydralazine.  -She has ICD in place, she has LBBB  Chronic abdominal pain  -She is on methadone and oral Dilaudid. Chronic abdominal pain secondary to chronic pancreatic bed problems.  -Had long standing history of chronic pancreatitis and infected pancreatic pseudocyst  - abdominal pain improved after paracentesis yesterday /  Disposition  -Patient said she is very weak, to the point she  couldn't walk.  - looking for SNF discharge  Code Status: DNR/DNI Family Communication: none  Disposition Plan: SNF discharge once Abx plan in place, paracentesis done. Hopefully Tuesday.   Consultants:  Nephrology  ID  Procedures:  none  Antibiotics (started last hospitalization ~06/29/12 with plans for a month)  Vancomycin 1/15 >>  Ceftazidime 1/15>>1/30  Voriconazole stopped 1/30  Micafungin 1/30 >>  HPI/Subjective: - no complaints this morning  Objective: Filed Vitals:   07/17/12 1748 07/17/12 2048 07/18/12 0447 07/18/12 0730  BP: 112/73 104/62 115/65 113/74  Pulse: 71 69 64 71  Temp: 97.6 F (36.4 C) 97.7 F (36.5 C) 97.5 F (36.4 C) 98.4 F (36.9 C)  TempSrc: Oral Oral Oral Oral  Resp: 20 20 18 18   Height:  5\' 4"  (1.626 m)    Weight:  50.037 kg (110 lb 5 oz)    SpO2: 100% 100% 98% 98%    Intake/Output Summary (Last 24 hours) at 07/18/12 1417 Last data filed at 07/18/12 0900  Gross per 24 hour  Intake   1194 ml  Output      0 ml  Net   1194 ml   Filed Weights   07/16/12 1040 07/16/12 2125 07/17/12 2048  Weight: 49.5 kg (109 lb 2 oz) 49.499 kg (109 lb 2 oz) 50.037 kg (110 lb 5 oz)    Exam:   General:  Frail appearing AA woman  Cardiovascular: RRR  Respiratory: CTA biL, no crackles  Abdomen: soft, NTTP. Paracentesis site clean without bleeding or leaks.   MSK - trace edema  Data Reviewed: Basic Metabolic Panel:  Lab 07/18/12 8119 07/16/12 0740 07/15/12 0630  07/14/12 0650 07/13/12 1344  NA 130* 134* 136 138 141  K 3.3* 3.4* 3.2* 4.2 3.5  CL 95* 97 99 99 100  CO2 27 28 29 27 26   GLUCOSE 90 117* 112* 75 105*  BUN 31* 32* 21 52* 50*  CREATININE 2.75* 2.82* 2.10* 3.46* 2.96*  CALCIUM 10.3 10.0 9.5 9.8 9.5  MG -- -- -- -- --  PHOS -- -- -- -- 5.0*    Lab 07/12/12 1341  LIPASE 29  AMYLASE --   CBC:  Lab 07/18/12 0550 07/16/12 0740 07/15/12 0630 07/14/12 0650 07/13/12 1344  WBC 8.2 12.2* 8.7 6.5 14.0*  NEUTROABS -- -- -- -- --   HGB 9.7* 8.0* 8.6* 9.0* 10.5*  HCT 31.5* 25.9* 27.9* 28.7* 31.3*  MCV 91.3 91.5 89.7 87.2 86.2  PLT 105* 124* 129* 168 251   BNP (last 3 results)  Basename 06/02/12 1944 05/08/12 0515 04/21/12 1419  PROBNP >70000.0* >70000.0* >70000.0*   CBG:  Lab 07/17/12 2052 07/14/12 2100 07/14/12 1642 07/14/12 0734 07/13/12 2036  GLUCAP 133* 109* 126* 82 109*    Recent Results (from the past 240 hour(s))  URINE CULTURE     Status: Normal   Collection Time   07/12/12  1:32 PM      Component Value Range Status Comment   Specimen Description URINE, CATHETERIZED   Final    Special Requests NONE   Final    Culture  Setup Time 07/12/2012 14:21   Final    Colony Count NO GROWTH   Final    Culture NO GROWTH   Final    Report Status 07/13/2012 FINAL   Final   BODY FLUID CULTURE     Status: Normal (Preliminary result)   Collection Time   07/17/12 10:47 AM      Component Value Range Status Comment   Specimen Description ASCITIC   Final    Special Requests NONE   Final    Gram Stain     Final    Value: CYTOSPIN NO WBC SEEN     NO ORGANISMS SEEN   Culture NO GROWTH 1 DAY   Final    Report Status PENDING   Incomplete      Studies: US Paracentesis  07/17/2012  *RADIOLOGY REPORT*  Clinical data: Cirrhosis, recurrent ascites, abdominal distention.  ULTRASOUND GUIDED PARACENTESIS  Technique: Survey ultrasound of the abdomen was performed and an appropriate skin entry site in the right upper abdomen was selected. Skin site was marked, prepped with Betadine, and draped in usual sterile fashion, and infiltrated locally with 1% lidocaine. A 5 French multisidehole Yueh sheath needle was advanced into the peritoneal space until fluid could be aspirated. The sheath was advanced and the needle removed. 2.1 liters of slightly cloudy yellow thin ascites were aspirated. No immediate complication.  IMPRESSION Technically successful ultrasound guided paracentesis, removing 2.1 liters  of ascites   Original Report  Authenticated By: D. Andria Rhein, MD     Scheduled Meds:    . aspirin EC  81 mg Oral Daily  . carvedilol  3.125 mg Oral BID WC  . darbepoetin  200 mcg Intravenous Q Tue-HD  . feeding supplement (NEPRO CARB STEADY)  237 mL Oral BID WC  . heparin  5,000 Units Subcutaneous Q8H  . methadone  10 mg Oral TID  . micafungin (MYCAMINE) IV  100 mg Intravenous Q24H  . multivitamin  1 tablet Oral Daily  . pantoprazole  40 mg Oral Daily  . ranolazine  500  mg Oral Daily  . senna-docusate  1 tablet Oral BID  . sevelamer  400 mg Oral TID WC  . sodium chloride  3 mL Intravenous Q12H  . vancomycin  500 mg Intravenous Q T,Th,Sa-HD   Continuous Infusions:   Active Problems:  Ischemic cardiomyopathy, EF 20-25% echo April 2013, now 10% by cath (05/06/12)  Chronic systolic congestive heart failure, NYHA class 3  LBBB (left bundle branch block)  Pancreatitis, chronic  ESRD on hemodialysis  Hyperkalemia  Complication of vascular access for dialysis  Pain in the abdomen  Failure to thrive in adult  Constipation  Pamella Pert  [facility] Pager [phone]. If 7PM-7AM, please contact night-coverage at [email or web address], password [facility] 07/18/2012, 2:17 PM  LOS: 6 days

## 2012-07-18 NOTE — Progress Notes (Signed)
Patient ID: [name], female   DOB: 30-Jan-1951, 62 y.o.   MRN: [id number]    [facility]    Date of Admission:  07/12/2012   Total days of antibiotics 20         Active Problems:  Ischemic cardiomyopathy, EF 20-25% echo April 2013, now 10% by cath (05/06/12)  Chronic systolic congestive heart failure, NYHA class 3  LBBB (left bundle branch block)  Pancreatitis, chronic  ESRD on hemodialysis  Hyperkalemia  Complication of vascular access for dialysis  Pain in the abdomen  Failure to thrive in adult  Constipation      . aspirin EC  81 mg Oral Daily  . carvedilol  3.125 mg Oral BID WC  . darbepoetin  200 mcg Intravenous Q Tue-HD  . feeding supplement (NEPRO CARB STEADY)  237 mL Oral BID WC  . heparin  5,000 Units Subcutaneous Q8H  . methadone  10 mg Oral TID  . micafungin (MYCAMINE) IV  100 mg Intravenous Q24H  . multivitamin  1 tablet Oral Daily  . pantoprazole  40 mg Oral Daily  . ranolazine  500 mg Oral Daily  . senna-docusate  1 tablet Oral BID  . sevelamer  400 mg Oral TID WC  . sodium chloride  3 mL Intravenous Q12H  . vancomycin  500 mg Intravenous Q T,Th,Sa-HD    Objective: Temp:  [97.5 F (36.4 C)-98.4 F (36.9 C)] 98.4 F (36.9 C) (02/03 0730) Pulse Rate:  [64-71] 71  (02/03 0730) Resp:  [17-20] 18  (02/03 0730) BP: (104-115)/(62-74) 113/74 mmHg (02/03 0730) SpO2:  [98 %-100 %] 98 % (02/03 0730) Weight:  [50.037 kg (110 lb 5 oz)] 50.037 kg (110 lb 5 oz) (02/02 2048)  2 liters of "cloudy" ascites removed yesterday  Lab Results Lab Results  Component Value Date   WBC 8.2 07/18/2012   HGB 9.7* 07/18/2012   HCT 31.5* 07/18/2012   MCV 91.3 07/18/2012   PLT 105* 07/18/2012    Lab Results  Component Value Date   CREATININE 2.75* 07/18/2012   BUN 31* 07/18/2012   NA 130* 07/18/2012   K 3.3* 07/18/2012   CL 95* 07/18/2012   CO2 27 07/18/2012    Lab Results  Component Value Date   ALT 86* 07/18/2012   AST 36 07/18/2012   ALKPHOS 174* 07/18/2012     BILITOT 0.5 07/18/2012      Microbiology: Recent Results (from the past 240 hour(s))  URINE CULTURE     Status: Normal   Collection Time   07/12/12  1:32 PM      Component Value Range Status Comment   Specimen Description URINE, CATHETERIZED   Final    Special Requests NONE   Final    Culture  Setup Time 07/12/2012 14:21   Final    Colony Count NO GROWTH   Final    Culture NO GROWTH   Final    Report Status 07/13/2012 FINAL   Final   BODY FLUID CULTURE     Status: Normal (Preliminary result)   Collection Time   07/17/12 10:47 AM      Component Value Range Status Comment   Specimen Description ASCITIC   Final    Special Requests NONE   Final    Gram Stain     Final    Value: CYTOSPIN NO WBC SEEN     NO ORGANISMS SEEN   Culture NO GROWTH 1 DAY   Final  Report Status PENDING   Incomplete     Studies/Results: US Paracentesis  07/17/2012  *RADIOLOGY REPORT*  Clinical data: Cirrhosis, recurrent ascites, abdominal distention.  ULTRASOUND GUIDED PARACENTESIS  Technique: Survey ultrasound of the abdomen was performed and an appropriate skin entry site in the right upper abdomen was selected. Skin site was marked, prepped with Betadine, and draped in usual sterile fashion, and infiltrated locally with 1% lidocaine. A 5 French multisidehole Yueh sheath needle was advanced into the peritoneal space until fluid could be aspirated. The sheath was advanced and the needle removed. 2.1 liters of slightly cloudy yellow thin ascites were aspirated. No immediate complication.  IMPRESSION Technically successful ultrasound guided paracentesis, removing 2.1 liters  of ascites   Original Report Authenticated By: D. 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 It appears that she's been on multiple long courses of antimicrobial therapy over the past year without cure. I would recommend continuing  current antimicrobial therapy for at least 8 more days. Her liver enzymes are improving off the voriconazole suspect that it was the culprit. I checked with [facility] microbiology lab today. The Candida isolated from peritoneal fluid on January 14 was sent out for susceptibility testing results will not be back for approximately one week. If she is discharged to a skilled nursing facility before then she can followup in our clinic to help determine her long-term antimicrobial management. She already is scheduled to see my partner, Dr. Ninetta Lights, on February 11.  Plan: 1. Recommend continuing vancomycin and micafungin for at least 8 more days  Cliffton Asters, MD [facility] [phone] pager   [phone] cell 07/18/2012, 12:37 PM

## 2012-07-18 NOTE — Progress Notes (Signed)
[facility] Progress Note  Subjective:   Resting in bed.  Complains of abdominal pain only.  Objective Filed Vitals:   07/17/12 1301 07/17/12 1748 07/17/12 2048 07/18/12 0447  BP: 104/70 112/73 104/62 115/65  Pulse: 69 71 69 64  Temp: 97.8 F (36.6 C) 97.6 F (36.4 C) 97.7 F (36.5 C) 97.5 F (36.4 C)  TempSrc: Oral Oral Oral Oral  Resp: 17 20 20 18   Height:   5\' 4"  (1.626 m)   Weight:   50.037 kg (110 lb 5 oz)   SpO2: 98% 100% 100% 98%   Physical Exam General: Chronically-ill appearing, alert, NAD. Heart: RRR, no murmur or rub appreciated. PM in left chest. GR 2/ Holosys M Lungs: CTA bilaterally. No wheezes, rales, or rhonchi noted Abdomen: Soft, non-distended. Diffusely tender to light palpation. Normal BS Liver down 4 cm Extremities: 1+ LE edema +2 presacral edema Dialysis Access: Rt I-J catheter, LFA AVG unusable.  Outpatient HD: (From last admit 1 wk ago) [place] [facility] On TTS, 4 hrs, Edw= 53.5 > 51 after last admit, heparin , EPO 1500 units q hd, no vit d last pth=178.2 (05/05/12) Bath= 3.0 k, 2.5 Ca, 1.0 mg. R IJ HD cath  Assessment/Plan: 1. Abdominal pain - Hx of cirrhosis, chronic pancreatitis, pseudocysts, and recurrent pancreatic abscess; refuses surgery; pancreatic aspirate 1/14 with enterococcus and yeast, s/p Vancomycin, now only Mycamine, per ID. Paracentesis on 2/2 with 2.1L fluid yield. 2. ESRD - HD on TTS @ [facility]; K 3.3. 4K bath for next HD 2/4. Will need perm access 3. Anemia - Hgb improving at 9.7, on Aranesp 200 mcg week,  4. HTN/Volume - BP 115/65 on Coreg 6.25 mg bid, Hydralazine 25 mg q8h; Imdur 30 mg qd. Under EDW.  Will need decrease at discharge. Try for 1-2 L UF goal next HD. Stop hydral, Isordil, lower coreg with low bp limiting vol off. No proven benefits either in ESRD 5. NONADHERENCE  A large issue 6. Pancreatic infx on AB 7. Ascites minimal fluid off 8. Secondary hyperparathyroidism - Ca 10.3 (12.3 corrected), P 5, iPTH 178 in  04/2012; on Renvela 400 mg with meals. Use low Ca+ bath. 9. Nutrition - Alb 1.5. High protein renal diet, Nepro. 10. FTT - on Methadone for chronic pain. 11. Hx CAD/ischemic cardiomyopathy - NSTEMI in 06/2011, echo with EF 10%. 12. Dialysis access - Using right IJ catheter; L arm AVG ligated 1/6 due to L arm swelling from L SCV stenosis and pacemaker; new access to be placed at right arm by Dr. Hollace Hayward was scheduled for 1/29, now pending discharge 13. DNR- see also palliative care notes   Scot Jun. Thad Ranger [facility] [phone] pager 07/18/2012,10:03 AM  LOS: 6 days   I have seen and examined this patient and agree with the plan of care . Not good insight into plans and needs.  Adherence will be an issue.  See above changes .  Etola Mull L 07/18/2012, 11:06 AM  Additional Objective Labs: Basic Metabolic Panel:  Lab 07/18/12 1478 07/16/12 0740 07/15/12 0630 07/13/12 1344  NA 130* 134* 136 --  K 3.3* 3.4* 3.2* --  CL 95* 97 99 --  CO2 27 28 29  --  GLUCOSE 90 117* 112* --  BUN 31* 32* 21 --  CREATININE 2.75* 2.82* 2.10* --  CALCIUM 10.3 10.0 9.5 --  ALB -- -- -- --  PHOS -- -- -- 5.0*   Liver Function Tests:  Lab 07/18/12 0550 07/16/12 0740 07/15/12 0630  AST 36 114* 393*  ALT 86* 157* 263*  ALKPHOS 174* 187* 215*  BILITOT 0.5 0.4 0.4  PROT 6.6 6.2 6.3  ALBUMIN 1.5* 1.5* 1.6*    Lab 07/12/12 1341  LIPASE 29  AMYLASE --   CBC:  Lab 07/18/12 0550 07/16/12 0740 07/15/12 0630 07/14/12 0650 07/13/12 1344  WBC 8.2 12.2* 8.7 -- --  NEUTROABS -- -- -- -- --  HGB 9.7* 8.0* 8.6* -- --  HCT 31.5* 25.9* 27.9* -- --  MCV 91.3 91.5 89.7 87.2 86.2  PLT 105* 124* 129* -- --   Blood Culture    Component Value Date/Time   SDES ASCITIC 07/17/2012 1047   SPECREQUEST NONE 07/17/2012 1047   CULT NO GROWTH 1 DAY 07/17/2012 1047   REPTSTATUS PENDING 07/17/2012 1047    CBG:  Lab 07/17/12 2052 07/14/12 2100 07/14/12 1642 07/14/12 0734 07/13/12 2036  GLUCAP 133* 109*  126* 82 109*   Studies/Results: US Paracentesis  07/17/2012  *RADIOLOGY REPORT*  Clinical data: Cirrhosis, recurrent ascites, abdominal distention.  ULTRASOUND GUIDED PARACENTESIS  Technique: Survey ultrasound of the abdomen was performed and an appropriate skin entry site in the right upper abdomen was selected. Skin site was marked, prepped with Betadine, and draped in usual sterile fashion, and infiltrated locally with 1% lidocaine. A 5 French multisidehole Yueh sheath needle was advanced into the peritoneal space until fluid could be aspirated. The sheath was advanced and the needle removed. 2.1 liters of slightly cloudy yellow thin ascites were aspirated. No immediate complication.  IMPRESSION Technically successful ultrasound guided paracentesis, removing 2.1 liters  of ascites   Original Report Authenticated By: D. Andria Rhein, MD    Medications:      . aspirin EC  81 mg Oral Daily  . carvedilol  6.25 mg Oral BID WC  . darbepoetin  200 mcg Intravenous Q Tue-HD  . feeding supplement (NEPRO CARB STEADY)  237 mL Oral BID WC  . heparin  5,000 Units Subcutaneous Q8H  . hydrALAZINE  25 mg Oral Q8H  . isosorbide mononitrate  30 mg Oral Daily  . methadone  10 mg Oral TID  . micafungin (MYCAMINE) IV  100 mg Intravenous Q24H  . multivitamin  1 tablet Oral Daily  . pantoprazole  40 mg Oral Daily  . ranolazine  500 mg Oral Daily  . senna-docusate  1 tablet Oral BID  . sevelamer  400 mg Oral TID WC  . sodium chloride  3 mL Intravenous Q12H

## 2012-07-19 DIAGNOSIS — K861 Other chronic pancreatitis: Secondary | ICD-10-CM

## 2012-07-19 LAB — RENAL FUNCTION PANEL
Albumin: 1.4 g/dL — ABNORMAL LOW (ref 3.5–5.2)
CO2: 27 mEq/L (ref 19–32)
Calcium: 9.7 mg/dL (ref 8.4–10.5)
GFR calc Af Amer: 15 mL/min — ABNORMAL LOW (ref >90)
GFR calc non Af Amer: 13 mL/min — ABNORMAL LOW (ref >90)
Phosphorus: 5.1 mg/dL — ABNORMAL HIGH (ref 2.3–4.6)
Sodium: 128 mEq/L — ABNORMAL LOW (ref 135–145)

## 2012-07-19 LAB — CBC
MCH: 28.3 pg (ref 26.0–34.0)
MCHC: 31.8 g/dL (ref 30.0–36.0)
Platelets: 135 10*3/uL — ABNORMAL LOW (ref 150–400)
RBC: 3.15 MIL/uL — ABNORMAL LOW (ref 3.87–5.11)
RDW: 18.3 % — ABNORMAL HIGH (ref 11.5–15.5)

## 2012-07-19 MED ORDER — HYDROMORPHONE HCL PF 1 MG/ML IJ SOLN
INTRAMUSCULAR | Status: AC
  Administered 2012-07-19: 2 mg via INTRAVENOUS
  Filled 2012-07-19: qty 2

## 2012-07-19 MED ORDER — DARBEPOETIN ALFA-POLYSORBATE 200 MCG/0.4ML IJ SOLN
INTRAMUSCULAR | Status: AC
  Administered 2012-07-19: 200 ug
  Filled 2012-07-19: qty 0.4

## 2012-07-19 NOTE — Progress Notes (Signed)
Physical Therapy Treatment Patient Details Name: LUBERTA GRABINSKI MRN: [id number] DOB: 1950-07-11 Today's Date: 07/19/2012 Time: 3086-5784 PT Time Calculation (min): 27 min  PT Assessment / Plan / Recommendation Comments on Treatment Session  Pt adm with weakness.  Pt making slow, steady progress.    Follow Up Recommendations  SNF     Does the patient have the potential to tolerate intense rehabilitation     Barriers to Discharge        Equipment Recommendations       Recommendations for Other Services    Frequency Min 3X/week   Plan Discharge plan remains appropriate;Frequency remains appropriate    Precautions / Restrictions Precautions Precautions: Fall Restrictions Weight Bearing Restrictions: No   Pertinent Vitals/Pain No c/o's    Mobility  Bed Mobility Sit to Supine: 4: Min assist Transfers Sit to Stand: 4: Min assist;With upper extremity assist;With armrests;From chair/3-in-1 Stand to Sit: 4: Min assist;With upper extremity assist;With armrests;To chair/3-in-1 Stand Pivot Transfers: 4: Min assist;With armrests Details for Transfer Assistance: Assist to lift hips Ambulation/Gait Ambulation/Gait Assistance: 4: Min assist Ambulation Distance (Feet): 25 Feet Assistive device: Rolling walker Gait Pattern: Step-through pattern;Trunk flexed;Decreased step length - right;Decreased step length - left;Left flexed knee in stance;Right flexed knee in stance    Exercises     PT Diagnosis:    PT Problem List:   PT Treatment Interventions:     PT Goals Acute Rehab PT Goals PT Goal: Sit to Supine/Side - Progress: Progressing toward goal PT Goal: Sit to Stand - Progress: Progressing toward goal PT Goal: Stand to Sit - Progress: Progressing toward goal PT Goal: Ambulate - Progress: Progressing toward goal  Visit Information  Last PT Received On: 07/19/12 Assistance Needed: +1    Subjective Data  Subjective: "I'll try," pt stated about walking   Cognition  Cognition Overall Cognitive Status: Appears within functional limits for tasks assessed/performed Arousal/Alertness: Awake/alert Orientation Level: Appears intact for tasks assessed Behavior During Session: [facility] for tasks performed    Balance  Dynamic Standing Balance Dynamic Standing - Balance Support: Bilateral upper extremity supported;During functional activity Dynamic Standing - Level of Assistance: 4: Min assist  End of Session PT - End of Session Equipment Utilized During Treatment: Gait belt Activity Tolerance: Patient limited by fatigue Patient left: in bed;with call bell/phone within reach Nurse Communication: Mobility status   GP     Freddrick Gladson 07/19/2012, 4:12 PM  [employer] PT [phone]

## 2012-07-19 NOTE — Procedures (Signed)
I was present at this session.  I have reviewed the session itself and made appropriate changes.  bp ok, needs more voloff.  Ephriam Turman L 2/4/20148:48 AM

## 2012-07-19 NOTE — Progress Notes (Signed)
Patient ID: [name], female   DOB: 1950-07-29, 62 y.o.   MRN: [id number]    [facility]    Date of Admission:  07/12/2012   Total days of antibiotics 21         Active Problems:  Ischemic cardiomyopathy, EF 20-25% echo April 2013, now 10% by cath (05/06/12)  Chronic systolic congestive heart failure, NYHA class 3  LBBB (left bundle branch block)  Pancreatitis, chronic  ESRD on hemodialysis  Complication of vascular access for dialysis  Failure to thrive in adult  Constipation  Patient nonadherence      . aspirin EC  81 mg Oral Daily  . carvedilol  3.125 mg Oral BID WC  . darbepoetin  200 mcg Intravenous Q Tue-HD  . feeding supplement (NEPRO CARB STEADY)  237 mL Oral BID WC  . heparin  5,000 Units Subcutaneous Q8H  . methadone  10 mg Oral TID  . micafungin (MYCAMINE) IV  100 mg Intravenous Q24H  . multivitamin  1 tablet Oral Daily  . pantoprazole  40 mg Oral Daily  . ranolazine  500 mg Oral Daily  . senna-docusate  1 tablet Oral BID  . sevelamer  400 mg Oral TID WC  . sodium chloride  3 mL Intravenous Q12H  . vancomycin  500 mg Intravenous Q T,Th,Sa-HD    Subjective: She is feeling better. Her chronic abdominal pain is under better control.  Objective: Temp:  [97.2 F (36.2 C)-98.4 F (36.9 C)] 98.4 F (36.9 C) (02/04 1402) Pulse Rate:  [61-84] 78  (02/04 1402) Resp:  [16-20] 18  (02/04 1402) BP: (82-113)/(45-75) 102/74 mmHg (02/04 1402) SpO2:  [90 %-100 %] 95 % (02/04 1402) Weight:  [48.6 kg (107 lb 2.3 oz)-50.4 kg (111 lb 1.8 oz)] 48.6 kg (107 lb 2.3 oz) (02/04 1211)  General: Alert, comfortable and talkative Abdomen: Soft and nontender  Lab Results Lab Results  Component Value Date   WBC 9.3 07/19/2012   HGB 8.9* 07/19/2012   HCT 28.0* 07/19/2012   MCV 88.9 07/19/2012   PLT 135* 07/19/2012    Microbiology: Recent Results (from the past 240 hour(s))  URINE CULTURE     Status: Normal   Collection Time   07/12/12  1:32 PM   Component Value Range Status Comment   Specimen Description URINE, CATHETERIZED   Final    Special Requests NONE   Final    Culture  Setup Time 07/12/2012 14:21   Final    Colony Count NO GROWTH   Final    Culture NO GROWTH   Final    Report Status 07/13/2012 FINAL   Final   BODY FLUID CULTURE     Status: Normal (Preliminary result)   Collection Time   07/17/12 10:47 AM      Component Value Range Status Comment   Specimen Description ASCITIC   Final    Special Requests NONE   Final    Gram Stain     Final    Value: CYTOSPIN NO WBC SEEN     NO ORGANISMS SEEN   Culture NO GROWTH 2 DAYS   Final    Report Status PENDING   Incomplete    Assessment: She appears to be improving on therapy for chronic, 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 Short-term, my plan is to continue vancomycin and micafungin admitting susceptibility testing a yeast isolated from peritoneal fluid last month. Decisions about long-term antibiotic management will be more difficult.  Plan: 1.  Continue vancomycin and micafungin at least one more week 2. Await susceptibility testing on yeast she has a followup visit with her partner, Dr. Ninetta Lights, in her clinic on  February 12 3.   Cliffton Asters, MD [facility] [phone] pager   [phone] cell 07/19/2012, 4:12 PM

## 2012-07-19 NOTE — Progress Notes (Signed)
[facility] PROGRESS NOTE  [name] WUJ:[id number] DOB: [birth year] DOA: 07/12/2012 PCP: Jyl Heinz, MD  Assessment/Plan:  Shortness of breath  - No evidence of pneumonia, pulmonary edema per chest x-ray.  - getting IHD, she appears stable from a volume standpoint - continues to be stable and breathing comfortably on room air  Transaminitis - may be related to her Voriconazole vs congestive hepatopathy 2/2 end stage heart failure vs liver pathology - will d/c that and switch to Micafungin. ID consulted, appreciate further input.  - LFTs continue to improve off of Voriconazole, continue Micafungin for now for 7 more days to be done on 07/27/12.  Cirrhosis  - decompensated currently - ascites seems that is bothering patient.  - US guided paracentesis on 07/18/12, patient tolerated procedure well.   Infected hemorrhagic pancreatic pseudocyst  - Status post aspiration on 06/28/2012, aspirate grew enterococcus and yeast.  - Patient is on Fortaz, vancomycin and voriconazole at home.  - This is being chronic problem, seen by general surgery earlier this month and she refused surgical excision.  - ID following here, currently on Micafungin and Vancomycin with HD. Antibiotics to be done until 07/27/12. Will see ID on 07/26/12 and have further decisions regarding antibiotics.   ESRD  - On dialysis on Tuesday, Thursday and Saturday.  - nephrology following - HD today 07/19/12  Hyperkalemia  - on IHD  Chronic systolic CHF, severe  -With LVEF of 10% on 05/08/2012 by 2-D echocardiogram.  -Has edema and orthopnea, no significant pulmonary edema or pleural effusion.  -She is on Coreg, Imdur and hydralazine.  -She has ICD in place, she has LBBB  Chronic abdominal pain  -She is on methadone and oral Dilaudid. Chronic abdominal pain secondary to chronic pancreatic bed problems.  -Had long standing history of chronic pancreatitis and infected pancreatic pseudocyst  - abdominal pain  improved after paracentesis  Disposition  -Patient said she is very weak, to the point she couldn't walk.  - looking for SNF discharge vs LTACH   Code Status: DNR/DNI Family Communication: none  Disposition Plan: SNF discharge once Abx plan in place, paracentesis done. Hopefully Tuesday.   Consultants:  Nephrology  ID  Procedures:  none  Antibiotics (started last hospitalization ~06/29/12 with plans for a month)  Vancomycin 1/15 >>  Ceftazidime 1/15>>1/30  Voriconazole stopped 1/30  Micafungin 1/30 >>  HPI/Subjective: - no complaints this morning  Objective: Filed Vitals:   07/19/12 1110 07/19/12 1133 07/19/12 1200 07/19/12 1211  BP: 100/66 102/46 82/45 103/61  Pulse: 72 78 80 61  Temp:    97.5 F (36.4 C)  TempSrc:    Oral  Resp: 18 18 18 18   Height:      Weight:    48.6 kg (107 lb 2.3 oz)  SpO2:    90%    Intake/Output Summary (Last 24 hours) at 07/19/12 1455 Last data filed at 07/19/12 1211  Gross per 24 hour  Intake    720 ml  Output   1900 ml  Net  -1180 ml   Filed Weights   07/18/12 2123 07/19/12 0752 07/19/12 1211  Weight: 50.037 kg (110 lb 5 oz) 50.4 kg (111 lb 1.8 oz) 48.6 kg (107 lb 2.3 oz)    Exam:   General:  Frail appearing AA woman  Cardiovascular: RRR  Respiratory: CTA biL, no crackles  Abdomen: soft, mildly tender to palpation in the epigastric area.   MSK - trace edema  Data Reviewed: Basic Metabolic Panel:  Lab 07/19/12 0809 07/18/12 0550 07/16/12 0740 07/15/12 0630 07/14/12 0650 07/13/12 1344  NA 128* 130* 134* 136 138 --  K 3.7 3.3* 3.4* 3.2* 4.2 --  CL 93* 95* 97 99 99 --  CO2 27 27 28 29 27  --  GLUCOSE 100* 90 117* 112* 75 --  BUN 43* 31* 32* 21 52* --  CREATININE 3.44* 2.75* 2.82* 2.10* 3.46* --  CALCIUM 9.7 10.3 10.0 9.5 9.8 --  MG -- -- -- -- -- --  PHOS 5.1* -- -- -- -- 5.0*   No results found for this basename: LIPASE:5,AMYLASE:5 in the last 168 hours CBC:  Lab 07/19/12 0809 07/18/12 0550 07/16/12 0740  07/15/12 0630 07/14/12 0650  WBC 9.3 8.2 12.2* 8.7 6.5  NEUTROABS -- -- -- -- --  HGB 8.9* 9.7* 8.0* 8.6* 9.0*  HCT 28.0* 31.5* 25.9* 27.9* 28.7*  MCV 88.9 91.3 91.5 89.7 87.2  PLT 135* 105* 124* 129* 168   BNP (last 3 results)  Basename 06/02/12 1944 05/08/12 0515 04/21/12 1419  PROBNP >70000.0* >70000.0* >70000.0*   CBG:  Lab 07/17/12 2052 07/14/12 2100 07/14/12 1642 07/14/12 0734 07/13/12 2036  GLUCAP 133* 109* 126* 82 109*    Recent Results (from the past 240 hour(s))  URINE CULTURE     Status: Normal   Collection Time   07/12/12  1:32 PM      Component Value Range Status Comment   Specimen Description URINE, CATHETERIZED   Final    Special Requests NONE   Final    Culture  Setup Time 07/12/2012 14:21   Final    Colony Count NO GROWTH   Final    Culture NO GROWTH   Final    Report Status 07/13/2012 FINAL   Final   BODY FLUID CULTURE     Status: Normal (Preliminary result)   Collection Time   07/17/12 10:47 AM      Component Value Range Status Comment   Specimen Description ASCITIC   Final    Special Requests NONE   Final    Gram Stain     Final    Value: CYTOSPIN NO WBC SEEN     NO ORGANISMS SEEN   Culture NO GROWTH 2 DAYS   Final    Report Status PENDING   Incomplete      Studies: No results found.  Scheduled Meds:    . aspirin EC  81 mg Oral Daily  . carvedilol  3.125 mg Oral BID WC  . darbepoetin  200 mcg Intravenous Q Tue-HD  . feeding supplement (NEPRO CARB STEADY)  237 mL Oral BID WC  . heparin  5,000 Units Subcutaneous Q8H  . methadone  10 mg Oral TID  . micafungin (MYCAMINE) IV  100 mg Intravenous Q24H  . multivitamin  1 tablet Oral Daily  . pantoprazole  40 mg Oral Daily  . ranolazine  500 mg Oral Daily  . senna-docusate  1 tablet Oral BID  . sevelamer  400 mg Oral TID WC  . sodium chloride  3 mL Intravenous Q12H  . vancomycin  500 mg Intravenous Q T,Th,Sa-HD   Continuous Infusions:   Active Problems:  Ischemic cardiomyopathy, EF 20-25%  echo April 2013, now 10% by cath (05/06/12)  Chronic systolic congestive heart failure, NYHA class 3  LBBB (left bundle branch block)  Pancreatitis, chronic  ESRD on hemodialysis  Complication of vascular access for dialysis  Failure to thrive in adult  Constipation  Patient nonadherence  [name]  [facility] Pager [phone]. If 7PM-7AM, please contact night-coverage at [email or web address], password [facility] 07/19/2012, 2:55 PM  LOS: 7 days

## 2012-07-19 NOTE — Progress Notes (Signed)
Subjective: Interval History: none.  Objective: Vital signs in last 24 hours: Temp:  [97.2 F (36.2 C)-98.5 F (36.9 C)] 97.5 F (36.4 C) (02/04 0752) Pulse Rate:  [68-84] 69  (02/04 0830) Resp:  [16-18] 16  (02/04 0830) BP: (101-113)/(61-76) 101/61 mmHg (02/04 0830) SpO2:  [97 %-100 %] 97 % (02/04 0752) Weight:  [50.037 kg (110 lb 5 oz)-50.4 kg (111 lb 1.8 oz)] 50.4 kg (111 lb 1.8 oz) (02/04 0752) Weight change: 0 kg (0 lb)  Intake/Output from previous day: 02/03 0701 - 02/04 0700 In: 960 [P.O.:960] Out: 0  Intake/Output this shift:    General appearance: alert, cooperative, cachectic and slowed mentation Resp: diminished breath sounds bilaterally Cardio: S1, S2 normal and systolic murmur: holosystolic 2/6, blowing at lower left sternal border GI: pos bs, soft, liver down 4 cm Extremities: edema 1+. old avg LLA  Lab Results:  [facility] 07/19/12 0809 07/18/12 0550  WBC 9.3 8.2  HGB 8.9* 9.7*  HCT 28.0* 31.5*  PLT 135* 105*   BMET:  Basename 07/19/12 0809 07/18/12 0550  NA 128* 130*  K 3.7 3.3*  CL 93* 95*  CO2 27 27  GLUCOSE 100* 90  BUN 43* 31*  CREATININE 3.44* 2.75*  CALCIUM 9.7 10.3   No results found for this basename: PTH:2 in the last 72 hours Iron Studies: No results found for this basename: IRON,TIBC,TRANSFERRIN,FERRITIN in the last 72 hours  Studies/Results: US Paracentesis  07/17/2012  *RADIOLOGY REPORT*  Clinical data: Cirrhosis, recurrent ascites, abdominal distention.  ULTRASOUND GUIDED PARACENTESIS  Technique: Survey ultrasound of the abdomen was performed and an appropriate skin entry site in the right upper abdomen was selected. Skin site was marked, prepped with Betadine, and draped in usual sterile fashion, and infiltrated locally with 1% lidocaine. A 5 French multisidehole Yueh sheath needle was advanced into the peritoneal space until fluid could be aspirated. The sheath was advanced and the needle removed. 2.1 liters of slightly cloudy yellow  thin ascites were aspirated. No immediate complication.  IMPRESSION Technically successful ultrasound guided paracentesis, removing 2.1 liters  of ascites   Original Report Authenticated By: D. Andria Rhein, MD     I have reviewed the patient's current medications.  Assessment/Plan: 1 ESRD for HD, vol xs yet.  BP low, have cut meds and may need to stop coreg 2 CM stable 3 Anemia will check fe, on max epo 4 Chronic pancreatitis/abscess on antifungal 5 FTT 6 Nonadherence P HD, epo, check fe, follow bp    LOS: 7 days   Alaiza Yau L 07/19/2012,8:49 AM

## 2012-07-19 NOTE — Progress Notes (Signed)
Counseling session #1. Pt. was sitting up on her chair and appeared more energetic today. Pt. discussed her plans to enroll in a rehabilitation center upon discharge. Pt. reported that she does not have a good appetite due to pain from a sore area. Pt. discussed the most recent conference with her children on palliative care and reported positive feelings about decisions relating to her after care. Pt. described feelings of frustration in her last hospital experience at another hospital relating to her physical discomfort due to stomach fluids. Pt. discussed fears of loneliness, and how it relates to her weakened physical state of being. Pt. described feelings of excitement and happiness when describing her birthday presents and her fiance. Pt. reported looking forward to our next counseling session scheduled on Friday, 2/8.   Sherol Dade Counseling Intern Haroldine Laws

## 2012-07-20 LAB — CBC
MCH: 27.3 pg (ref 26.0–34.0)
MCHC: 30.1 g/dL (ref 30.0–36.0)
Platelets: 137 10*3/uL — ABNORMAL LOW (ref 150–400)
RDW: 18.8 % — ABNORMAL HIGH (ref 11.5–15.5)

## 2012-07-20 LAB — IRON AND TIBC
Saturation Ratios: 21 % (ref 20–55)
TIBC: 125 ug/dL — ABNORMAL LOW (ref 250–470)
UIBC: 99 ug/dL — ABNORMAL LOW (ref 125–400)

## 2012-07-20 LAB — COMPREHENSIVE METABOLIC PANEL
ALT: 49 U/L — ABNORMAL HIGH (ref 0–35)
AST: 22 U/L (ref 0–37)
Calcium: 9.7 mg/dL (ref 8.4–10.5)
Creatinine, Ser: 2.06 mg/dL — ABNORMAL HIGH (ref 0.50–1.10)
GFR calc Af Amer: 28 mL/min — ABNORMAL LOW (ref >90)
Sodium: 133 mEq/L — ABNORMAL LOW (ref 135–145)
Total Protein: 6.5 g/dL (ref 6.0–8.3)

## 2012-07-20 LAB — PHOSPHORUS: Phosphorus: 3.3 mg/dL (ref 2.3–4.6)

## 2012-07-20 MED ORDER — METHADONE HCL 10 MG PO TABS: 10.0000 mg | ORAL_TABLET | Freq: Four times a day (QID) | ORAL | Status: DC

## 2012-07-20 MED ORDER — SODIUM CHLORIDE 0.9 % IV SOLN: 100.0000 mg | INTRAVENOUS | Status: DC

## 2012-07-20 MED ORDER — HYDROMORPHONE HCL 2 MG PO TABS: 2.0000 mg | ORAL_TABLET | Freq: Four times a day (QID) | ORAL | Status: DC | PRN

## 2012-07-20 MED ORDER — VANCOMYCIN HCL 500 MG IV SOLR: 500.0000 mg | INTRAVENOUS | Status: DC

## 2012-07-20 NOTE — Progress Notes (Signed)
[facility] Progress Note  Subjective:   Ongoing complaints of abdominal pain. Afebrile.  Objective Filed Vitals:   07/19/12 1402 07/19/12 1713 07/19/12 2138 07/20/12 0509  BP: 102/74 109/56 100/60 105/70  Pulse: 78 79 74 76  Temp: 98.4 F (36.9 C) 97.8 F (36.6 C) 97.6 F (36.4 C) 98.2 F (36.8 C)  TempSrc: Oral Oral Oral Oral  Resp: 18 20 20 18   Height:      Weight:   51.4 kg (113 lb 5.1 oz)   SpO2: 95% 98% 100% 100%   Physical Exam General: Chronically-ill appearing, alert, NAD Heart: RRR, No murmur or rub, AICD left chest Gr 3/6 M. 2+ edema Lungs: CTA bilaterally. No wheezes, rales or rhonci occ rhonchi Abdomen: Soft, trace distension, tender to light palpation. Normal BS liver down 4 cm Extremities: No LE edema.  Intermittent ticks/tremors to bilateral UEs Dialysis Access: Rt I-J.  O x 2 confused Outpatient HD: (From last admit 1 wk ago) [place] [facility] On TTS, 4 hrs, Edw= 53.5 > 51 after last admit, heparin , EPO 1500 units q hd, no vit d last pth=178.2 (05/05/12) Bath= 3.0 k, 2.5 Ca, 1.0 mg. R IJ HD cath   Assessment/Plan: 1. Abdominal pain - Hx of cirrhosis, chronic pancreatitis, pseudocysts, and recurrent pancreatic abscess; refuses surgery; pancreatic aspirate 1/14 with enterococcus and yeast, s/p Vancomycin, now only Mycamine, per ID. Paracentesis on 2/2 with 2.1L fluid yield.1 more wk AB 2. ESRD - HD on TTS @ [facility]; K 3.8. 4K bath for next HD 2/6. Vol xs 3. Anemia - Hgb improving at 9.2, on Aranesp 200 mcg week, Needs iron studies.  4. HTN/Volume - BP 105/70 on Coreg 3.125 mg bid, may have to d/c 5. Secondary hyperparathyroidism - Ca 9.7 (11.7 corrected), P 3.3, iPTH 178 in 04/2012; on Renvela 400 mg with meals. Use low Ca+ bath. 6. Nutrition - Alb 1.5. High protein renal diet, Nepro. 7. FTT - on Methadone for chronic pain. 8. Hx CAD/ischemic cardiomyopathy - NSTEMI in 06/2011, echo with EF 10%. 9. Dialysis access - Using right IJ catheter; L arm AVG  ligated 1/6 due to L arm swelling from L SCV stenosis and pacemaker; new access to be placed at right arm by Dr. Hollace Hayward was scheduled for 1/29, now pending discharge. 10. DNR- See also palliative care notes.    11. Confusion suspect Methadone  Scot Jun. Thad Ranger [facility] [phone] pager 07/20/2012,9:28 AM  LOS: 8 days  I have seen and examined this patient and agree with the plan of care vol xs, confusion. See above changes. .  Norene Oliveri L 07/20/2012, 9:45 AM   Additional Objective Labs: Basic Metabolic Panel:  Lab 07/20/12 7846 07/19/12 0809 07/18/12 0550 07/13/12 1344  NA 133* 128* 130* --  K 3.8 3.7 3.3* --  CL 98 93* 95* --  CO2 26 27 27  --  GLUCOSE 91 100* 90 --  BUN 20 43* 31* --  CREATININE 2.06* 3.44* 2.75* --  CALCIUM 9.7 9.7 10.3 --  ALB -- -- -- --  PHOS 3.3 5.1* -- 5.0*   Liver Function Tests:  Lab 07/20/12 0615 07/19/12 0809 07/18/12 0550 07/16/12 0740  AST 22 -- 36 114*  ALT 49* -- 86* 157*  ALKPHOS 174* -- 174* 187*  BILITOT 0.3 -- 0.5 0.4  PROT 6.5 -- 6.6 6.2  ALBUMIN 1.5* 1.4* 1.5* --   CBC:  Lab 07/20/12 0615 07/19/12 0809 07/18/12 0550 07/16/12 0740 07/15/12 0630  WBC 8.9 9.3  8.2 -- --  NEUTROABS -- -- -- -- --  HGB 9.2* 8.9* 9.7* -- --  HCT 30.6* 28.0* 31.5* -- --  MCV 90.8 88.9 91.3 91.5 89.7  PLT 137* 135* 105* -- --   Blood Culture    Component Value Date/Time   SDES ASCITIC 07/17/2012 1047   SPECREQUEST NONE 07/17/2012 1047   CULT NO GROWTH 2 DAYS 07/17/2012 1047   REPTSTATUS PENDING 07/17/2012 1047    CBG:  Lab 07/17/12 2052 07/14/12 2100 07/14/12 1642 07/14/12 0734 07/13/12 2036  GLUCAP 133* 109* 126* 82 109*   IMedications:      . aspirin EC  81 mg Oral Daily  . carvedilol  3.125 mg Oral BID WC  . darbepoetin  200 mcg Intravenous Q Tue-HD  . feeding supplement (NEPRO CARB STEADY)  237 mL Oral BID WC  . heparin  5,000 Units Subcutaneous Q8H  . methadone  10 mg Oral TID  . micafungin (MYCAMINE) IV  100 mg  Intravenous Q24H  . multivitamin  1 tablet Oral Daily  . pantoprazole  40 mg Oral Daily  . ranolazine  500 mg Oral Daily  . senna-docusate  1 tablet Oral BID  . sevelamer  400 mg Oral TID WC  . sodium chloride  3 mL Intravenous Q12H  . vancomycin  500 mg Intravenous Q T,Th,Sa-HD

## 2012-07-20 NOTE — Discharge Summary (Signed)
Physician Discharge Summary  [name] MRN:[id number] DOB: 11/15/1950 DOA: 07/12/2012  PCP: ARMOUR, ROSS B, MD  Admit date: 07/12/2012 Discharge date: 07/20/2012  Time spent: 25 minutes  Recommendations for Outpatient Follow-up:  1. Recommend followup with infectious disease clinic. Patient has appointment 07/26/12  2. Please followup on peritoneal fluid aspirate cultures done 07/17/12-these were negative x2 days on the day of discharge 3. Recommend basic metabolic panel and CBC in 3-5 days 4. Start date for antibiotics is 07/26/12 for both micafungin and vancomycin-further determination of antibiotic needs as per infectious disease  Discharge Diagnoses:  Active Problems:  Ischemic cardiomyopathy, EF 20-25% echo April 2013, now 10% by cath (05/06/12)  Chronic systolic congestive heart failure, NYHA class 3  LBBB (left bundle branch block)  Pancreatitis, chronic  ESRD on hemodialysis  Complication of vascular access for dialysis  Failure to thrive in adult  Constipation  Patient nonadherence   Discharge Condition: Stable  Diet recommendation: Renal  Filed Weights   07/19/12 0752 07/19/12 1211 07/19/12 2138  Weight: 50.4 kg (111 lb 1.8 oz) 48.6 kg (107 lb 2.3 oz) 51.4 kg (113 lb 5.1 oz)    History of present illness:  62 y.o. female with past medical history of ischemic cardiomyopathy, chronic systolic CHF with ejection fraction of 10%, ESRD and history of complicated infected pancreatic pseudocyst. She came in to the hospital because of shortness of breath. Patient said she completed her dialysis on Saturday, but she was feeling progressively weak and short of breath. Admitted 07/12/12 with shortness of breath Recently discharged 07/06/12 aspiration of infected pancreatic pseudocyst-culture showed enterococcus and yeast. Patient was on vancomycin, Fortaz, voriconazole as an outpatient  Hospital Course:  Shortness of breath  - No evidence of pneumonia, pulmonary edema per chest  x-ray.  - getting IHD, she appears stable from a volume standpoint  - continues to be stable and breathing comfortably on room air  Transaminitis  - may be related to her Voriconazole vs congestive hepatopathy 2/2 end stage heart failure vs liver pathology  - will d/c that and switch to Micafungin. ID consulted, appreciate further input.  - LFTs continue to improve off of Voriconazole, continue Micafungin for now for 7 more days to be done on 07/27/12.  Cirrhosis  - decompensated currently  - ascites seems that is bothering patient.  - US guided paracentesis on 07/18/12, patient tolerated procedure well.  Infected hemorrhagic pancreatic pseudocyst  - Status post aspiration on 06/28/2012, aspirate grew enterococcus and yeast.  - Patient was on Fortaz, vancomycin and voriconazole at home.  - This is being chronic problem, seen by general surgery previously and she refused surgical excision.  - ID following here, currently on Micafungin and Vancomycin with HD. Antibiotics to be done until 07/27/12. Will see ID on 07/26/12 and have further decisions regarding antibiotics.  -Follow Aspirate done 07/17/12 for cultures to determine therapy course ESRD  - On dialysis on Tuesday, Thursday and Saturday.  - nephrology following  Hyperkalemia  - on IHD  Chronic systolic CHF, severe  -With LVEF of 10% on 05/08/2012 by 2-D echocardiogram.  -Has edema and orthopnea, no significant pulmonary edema or pleural effusion.  -She is on Coreg, Imdur and hydralazine.  -She has ICD in place, she has LBBB  Chronic abdominal pain  -She is on methadone 10 mg tid daily.  Orla dilaudid changed back to PO Chronic abdominal pain secondary to chronic pancreatic bed problems.  -Had long standing history of chronic pancreatitis and infected pancreatic pseudocyst  -   abdominal pain improved after paracentesis  Disposition  -Patient said she is very weak, to the point she couldn't walk.  -being d/c to SNF  Consultants:   Nephrology  ID  Procedures:  None  Antibiotics (started last hospitalization ~06/29/12 with plans for a month)  Vancomycin 1/15 >> 07/26/12 Ceftazidime 1/15>>1/30  Voriconazole stopped 1/30  Micafungin 1/30 >>07/26/12  Discharge Exam: Filed Vitals:   07/19/12 1713 07/19/12 2138 07/20/12 0509 07/20/12 1102  BP: 109/56 100/60 105/70 100/64  Pulse: 79 74 76 73  Temp: 97.8 F (36.6 C) 97.6 F (36.4 C) 98.2 F (36.8 C) 97.4 F (36.3 C)  TempSrc: Oral Oral Oral Oral  Resp: 20 20 18 18  Height:      Weight:  51.4 kg (113 lb 5.1 oz)    SpO2: 98% 100% 100% 100%   Well no new issues. [place] is 10/10 but seated comfortably in bed No n/v/CP   General: EOMI Cardiovascular: s1 s2 M noted  Respiratory: clear  Discharge Instructions  Discharge Orders    Future Appointments: Provider: Department: Dept Phone: Center:   07/26/2012 2:00 PM Jeffrey C Hatcher, MD [facility] [phone] [facility]     Future Orders Please Complete By Expires   Diet - low sodium heart healthy      Increase activity slowly      Call MD for:  redness, tenderness, or signs of infection (pain, swelling, redness, odor or green/yellow discharge around incision site)      Call MD for:  temperature >100.4      Call MD for:  persistant nausea and vomiting      Call MD for:  severe uncontrolled pain          Medication List     As of 07/20/2012 11:54 AM    STOP taking these medications         voriconazole 200 MG tablet   Commonly known as: VFEND      TAKE these medications         aspirin 81 MG EC tablet   Take 1 tablet (81 mg total) by mouth daily.      carvedilol 6.25 MG tablet   Commonly known as: COREG   Take 1 tablet (6.25 mg total) by mouth 2 (two) times daily with a meal.      feeding supplement (NEPRO CARB STEADY) Liqd   Take 237 mLs by mouth 2 (two) times daily with breakfast and lunch.      hydrALAZINE 25 MG tablet   Commonly known as: APRESOLINE    Take 1 tablet (25 mg total) by mouth every 8 (eight) hours.      HYDROmorphone 2 MG tablet   Commonly known as: DILAUDID   Take 1 tablet (2 mg total) by mouth every 6 (six) hours as needed for pain. For pain      isosorbide mononitrate 30 MG 24 hr tablet   Commonly known as: IMDUR   Take 1 tablet (30 mg total) by mouth daily.      methadone 10 MG tablet   Commonly known as: DOLOPHINE   Take 1 tablet (10 mg total) by mouth 4 (four) times daily.      multivitamin Tabs tablet   Take 1 tablet by mouth daily.      pantoprazole 40 MG tablet   Commonly known as: PROTONIX   Take 40 mg by mouth daily.      pravastatin 20 MG tablet     Commonly known as: PRAVACHOL   Take 20 mg by mouth at bedtime.      ranolazine 500 MG 12 hr tablet   Commonly known as: RANEXA   Take 500 mg by mouth daily.      sevelamer 400 MG tablet   Commonly known as: RENAGEL   Take 400 mg by mouth daily. With a meal      sodium chloride 0.9 % SOLN 100 mL with micafungin 50 MG SOLR 100 mg   Inject 100 mg into the vein daily.      sodium chloride 0.9 % SOLN 100 mL with vancomycin 500 MG SOLR 500 mg   Inject 500 mg into the vein Every Tuesday,Thursday,and Saturday with dialysis.           Follow-up Information    Follow up with ARMOUR, ROSS B, MD.   Contact information:   [address] [place] [address] [phone]       Follow up with Jeffrey Hatcher, MD. (has appt 07/26/12-call offcie for details)    Contact information:   [address]  [address] [place] [phone]           The results of significant diagnostics from this hospitalization (including imaging, microbiology, ancillary and laboratory) are listed below for reference.    Significant Diagnostic Studies: Ct Abdomen Pelvis Wo Contrast  07/12/2012  *RADIOLOGY REPORT*  Clinical Data: Shortness of breath, intermittent abdominal pain and nausea.  Recent history of necrotizing  pancreatitis.  CT ABDOMEN AND PELVIS WITHOUT CONTRAST  Technique:  Multidetector CT imaging of the abdomen and pelvis was performed following the standard protocol without intravenous contrast.  Comparison: None.  Findings: The patient declined oral contrast.  Streak artifact from defibrillator leads noted.  Marked enlargement of the heart again noted.  Trace right pleural effusion with associated compressive atelectasis noted.  This is stable compared to the prior exam. Left basilar dependent atelectasis again noted.  There has been slight decrease in abdominal ascites since previously. Cholecystectomy clips noted.  The pancreas is ill- defined with a more focal area of hypodensity in the region of the tail which could represent an abscess or other fluid collection, for example image 26.  Scattered foci of peritoneal gas are again noted.  Subjectively, this is minimally decreased since previously. Extensive vascular calcification is noted.  The lack of contrast, vascular patency cannot be assessed to determine interval change in previously seen portal vein thrombosis.  There is diffuse mild prominence of the ascending colon and proximal transverse colon, for example image 40, but this is difficult to separate from surrounding fluid.  No pneumatosis is identified. This finding appears new since prior exam.  Large pelvic ascites is noted.  Uterus and ovaries not visualized. Bladder is decompressed.  It is not well visualized separate from large pelvic ascites, but is visualized on coronal and sagittal re- formations, for example coronal image 35 and sagittal image 78. Large amount of stool noted within the rectum.  Anasarca/third spacing noted.  Due to lack of IV contrast, this significantly decreases conspicuity of detail within the solid and hollow viscera.  IMPRESSION: Interval development of ascending colonic and proximal transverse colon wall thickening, which differential considerations include ischemia,  infectious/inflammatory colitis, or less likely edema related to anasarca/third spacing.  Slight decrease in ascites.  Slight decrease in peripancreatic gas.  Persistent hypodensity in the tail of the pancreas which could represent a pseudocyst or peripancreatic abscess.    Stable right pleural effusion and bibasilar atelectasis.  Suboptimal visualization due to lack of oral and IV contrast.   Original Report Authenticated By: Gretchen Green, M.D.    Dg Chest 2 View  06/25/2012  *RADIOLOGY REPORT*  Clinical Data: Abdominal pain  CHEST - 2 VIEW  Comparison: 06/02/2012  Findings: There is a right chest wall dialysis catheter with tip in the right atrium.  Left chest wall AICD is noted with leads in the right atrial appendage, coronary sinus and right ventricle. Previous median sternotomy and CABG procedure.  There is marked cardiac enlargement. The small pleural effusions and mild interstitial edema noted.  IMPRESSION:  1.  Marked cardiac enlargement. 2.  Small pleural effusions and mild interstitial edema suggestive of CHF.   Original Report Authenticated By: Taylor Stroud, M.D.    Us Abdomen Complete  07/14/2012  *RADIOLOGY REPORT*  Clinical Data:  62-year-old female with abdominal pain and elevated LFTs.  History of cholecystectomy, cirrhosis, chronic pancreatitis, cardiomyopathy and end-stage renal disease.  ABDOMINAL ULTRASOUND COMPLETE  Comparison:  07/12/2012 and prior CTs.  Findings:  Gallbladder:  The gallbladder is not visualized compatible with cholecystectomy.  Common Bile Duct:  There is no evidence of intrahepatic or extrahepatic biliary dilation. The CBD measures 6.0 mm in greatest diameter.  Liver:  The liver is heterogeneous in echotexture with increased echogenicity. No focal abnormalities are identified.  IVC:  Appears normal.  Pancreas: A cyst within the pancreatic tail appears unchanged.  Spleen:  Within normal limits in size and echotexture.  Right kidney: The right kidney is atrophic and  diffusely echogenic measuring 7 cm.  There is no evidence of hydronephrosis or definite renal mass.  Left kidney: The left kidney is atrophic and diffusely echogenic measuring 7 cm.  There is no evidence of hydronephrosis or definite renal mass. Several cysts are present.  Abdominal Aorta:  No abdominal aortic aneurysm identified.  Ascites and bilateral pleural effusions are noted.  IMPRESSION:  Heterogeneous and increased echogenicity of the liver which can be seen with this patient's history of cirrhosis and fatty infiltration.  Chronic medical renal disease.  Ascites and bilateral pleural effusions.   Original Report Authenticated By: Jeffrey Hu, M.D.    Ct Abdomen Pelvis W Contrast  06/26/2012  *RADIOLOGY REPORT*  Clinical Data: Worsening abdominal pain.  The patient has a history of complex infected pseudocyst with pancreatic necrosis.  Last admission 05/22/2012.  History of chronic pancreatitis.  CT ABDOMEN AND PELVIS WITH CONTRAST  Technique:  Multidetector CT imaging of the abdomen and pelvis was performed following the standard protocol during bolus administration of intravenous contrast.  Contrast: 80mL OMNIPAQUE IOHEXOL 300 MG/ML  SOLN  Comparison: 05/24/2012  Findings: Small right pleural effusion.  Atelectasis or infiltration in both lung bases.  Diffuse cardiac enlargement. Postoperative changes in the mediastinum.  There is diffuse abdominal and pelvic fluid consistent with ascites, increasing since previous study.  Diffuse subcutaneous soft tissue edema.  The liver demonstrates diffuse central hypo perfusion pattern similar to previous study.  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  Spleen size is normal.  Gallbladder is surgically absent.  Visualization of the pancreas is limited due to obscuration by the surrounding fluid.  There appear to be gas collections throughout the pancreas or peripancreatic tissues with gas and fluid collection again demonstrated at the left upper quadrant  extending from the region of the pancreatic tail into the splenic hilum.  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  Gas and fluid collections   appear to be increasing mildly since the previous study. No discrete drainable collection is identified.  Visualization of bowel is limited due to limited opacification.  However, there is suggestion of mild wall thickening consistent with edema in the jejunum.  Contrast material does pass through the colon suggesting no evidence of obstruction. The stomach is decompressed.  The colon is stool-filled.  The kidneys demonstrate small parenchymal cysts.  There is no hydronephrosis.  There is no contrast material shown in the renal collecting systems on the delayed images, suggesting possible delayed appearance but delayed images were obtained only 2-3 minutes after arterial phase in this may be artifactual.  Delayed appearance of contrast material in the renal collecting system cannot suggest renal hypoperfusion or medical renal disease.  Ascites extends into the pelvis.  The bladder is decompressed. Pelvic organs are not visualized.  Scattered diverticula in the sigmoid colon without definite diverticulitis.  The appendix is normal.  Normal alignment of the lumbar vertebrae without compression deformity or expansile destructive change.  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  Ascites.  Abnormal hepatic parenchymal perfusion with diffuse portal venous thrombosis.  Changes have progressed since previous study.  Probable edema in the jejunum which might be related to ascites.  Delayed appearance of contrast material in the renal collecting systems suggest renal parenchymal disease.   Original Report Authenticated By: William Stevens, M.D.    Ct Aspiration  07/01/2012  *RADIOLOGY REPORT*  CT GUIDED ASPIRATION  Date: 06/28/2012  Clinical History: 61-year-old female with a history of  pancreatitis and peripancreatic fluid and gas collections concerning for retroperitoneal abscess.  Procedures Performed: 1. CT guided aspiration of left retroperitoneal fluid gas collection  Interventional Radiologist:  Heath K. McCullough, MD  Sedation: Moderate (conscious) sedation was used.  25 mg Versed, one mcg Fentanyl were administered intravenously.  The patient's vital signs were monitored continuously by radiology nursing throughout the procedure.  Sedation Time: 82 minutes  Fluoroscopy time: 3 seconds  Contrast volume: None  PROCEDURE/FINDINGS:   Informed consent was obtained from the patient following explanation of the procedure, risks, benefits and alternatives. The patient understands, agrees and consents for the procedure. All questions were addressed. A time out was performed.  Maximal barrier sterile technique utilized including caps, mask, sterile gowns, sterile gloves, large sterile drape, hand hygiene, and betadine skin prep.  A planning axial CT scan was performed.  A small fluid and gas collection in the left retroperitoneal space just anterior to the lower pole of the spleen was successfully identified.  A suitable skin entry site was selected and marked. Local anesthesia was achieved by infiltration of 1% lidocaine.  Under direct CT fluoroscopic guidance in 18 gauge trocar needle was advanced into the collection.  A total of 15 ml of opaque white/pain and fluid was successfully aspirated.  The needle was removed.  The fluid will be sent for both bacterial and fungal culture.  The patient tolerated the procedure well, there is no immediate complication.  IMPRESSION:  Technically successful CT-guided aspiration of left retroperitoneal fluid and gas collection.  15 ml of opaque, white/tan material was successfully aspirated and sent for culture.  Signed,  Heath K. McCullough, MD Vascular & Interventional Radiologist [facility]   Original Report Authenticated By: Heath McCullough, M.D.     Us Paracentesis  07/17/2012  *RADIOLOGY REPORT*  Clinical data: Cirrhosis, recurrent ascites, abdominal distention.  ULTRASOUND GUIDED PARACENTESIS  Technique: Survey ultrasound of the abdomen was performed and an appropriate   skin entry site in the right upper abdomen was selected. Skin site was marked, prepped with Betadine, and draped in usual sterile fashion, and infiltrated locally with 1% lidocaine. A 5 French multisidehole Yueh sheath needle was advanced into the peritoneal space until fluid could be aspirated. The sheath was advanced and the needle removed. 2.1 liters of slightly cloudy yellow thin ascites were aspirated. No immediate complication.  IMPRESSION Technically successful ultrasound guided paracentesis, removing 2.1 liters  of ascites   Original Report Authenticated By: D. Hassell III, MD    Us Paracentesis  07/01/2012  *RADIOLOGY REPORT*  Clinical Data: Abdominal pain secondary to pancreatitis with pseudocyst, ascites  ULTRASOUND GUIDED PARACENTESIS  Comparison:  None  An ultrasound guided paracentesis was thoroughly discussed with the patient and questions answered.  The benefits, risks, alternatives and complications were also discussed.  The patient understands and wishes to proceed with the procedure.  Written consent was obtained.  Ultrasound was performed to localize and mark an adequate pocket of fluid in the left lower quadrant of the abdomen.  The area was then prepped and draped in the normal sterile fashion.  1% Lidocaine was used for local anesthesia.  Under ultrasound guidance a 19 gauge Yueh catheter was introduced.  Paracentesis was performed.  The catheter was removed and a dressing applied.  Complications:  None  Findings:  A total of approximately 3 liters of amber colored fluid was removed.  A fluid sample was not sent for laboratory analysis.  IMPRESSION: Successful ultrasound guided paracentesis yielding 3 liters of ascites.  Read by Kevin Bruning PA-C   Original  Report Authenticated By: Heath McCullough, M.D.    Dg Chest Port 1 View  07/12/2012  *RADIOLOGY REPORT*  Clinical Data: Short of breath  PORTABLE CHEST - 1 VIEW  Comparison: 06/25/2012  Findings: Prominent cardiomegaly is stable.  Left subclavian AICD device and leads are stable.  Right internal jugular dialysis catheter stable.  Stable vascular congestion.  No pulmonary edema. Tiny pleural effusions stable.  IMPRESSION: Improved edema.  Mild vascular congestion persist.   Original Report Authenticated By: Arthur Hoss, M.D.     Microbiology: Recent Results (from the past 240 hour(s))  URINE CULTURE     Status: Normal   Collection Time   07/12/12  1:32 PM      Component Value Range Status Comment   Specimen Description URINE, CATHETERIZED   Final    Special Requests NONE   Final    Culture  Setup Time 07/12/2012 14:21   Final    Colony Count NO GROWTH   Final    Culture NO GROWTH   Final    Report Status 07/13/2012 FINAL   Final   BODY FLUID CULTURE     Status: Normal (Preliminary result)   Collection Time   07/17/12 10:47 AM      Component Value Range Status Comment   Specimen Description ASCITIC   Final    Special Requests NONE   Final    Gram Stain     Final    Value: CYTOSPIN NO WBC SEEN     NO ORGANISMS SEEN   Culture NO GROWTH 2 DAYS   Final    Report Status PENDING   Incomplete      Labs: Basic Metabolic Panel:  Lab 07/20/12 0615 07/19/12 0809 07/18/12 0550 07/16/12 0740 07/15/12 0630 07/13/12 1344  NA 133* 128* 130* 134* 136 --  K 3.8 3.7 3.3* 3.4* 3.2* --  CL 98 93* 95* 97   99 --  CO2 26 27 27 28 29 --  GLUCOSE 91 100* 90 117* 112* --  BUN 20 43* 31* 32* 21 --  CREATININE 2.06* 3.44* 2.75* 2.82* 2.10* --  CALCIUM 9.7 9.7 10.3 10.0 9.5 --  MG -- -- -- -- -- --  PHOS 3.3 5.1* -- -- -- 5.0*   Liver Function Tests:  Lab 07/20/12 0615 07/19/12 0809 07/18/12 0550 07/16/12 0740 07/15/12 0630 07/14/12 0650  AST 22 -- 36 114* 393* 945*  ALT 49* -- 86* 157* 263* 358*   ALKPHOS 174* -- 174* 187* 215* 237*  BILITOT 0.3 -- 0.5 0.4 0.4 0.4  PROT 6.5 -- 6.6 6.2 6.3 6.4  ALBUMIN 1.5* 1.4* 1.5* 1.5* 1.6* --   No results found for this basename: LIPASE:5,AMYLASE:5 in the last 168 hours No results found for this basename: AMMONIA:5 in the last 168 hours CBC:  Lab 07/20/12 0615 07/19/12 0809 07/18/12 0550 07/16/12 0740 07/15/12 0630  WBC 8.9 9.3 8.2 12.2* 8.7  NEUTROABS -- -- -- -- --  HGB 9.2* 8.9* 9.7* 8.0* 8.6*  HCT 30.6* 28.0* 31.5* 25.9* 27.9*  MCV 90.8 88.9 91.3 91.5 89.7  PLT 137* 135* 105* 124* 129*   Cardiac Enzymes: No results found for this basename: CKTOTAL:5,CKMB:5,CKMBINDEX:5,TROPONINI:5 in the last 168 hours BNP: BNP (last 3 results)  Basename 06/02/12 1944 05/08/12 0515 04/21/12 1419  PROBNP >70000.0* >70000.0* >70000.0*   CBG:  Lab 07/17/12 2052 07/14/12 2100 07/14/12 1642 07/14/12 0734 07/13/12 2036  GLUCAP 133* 109* 126* 82 109*       Signed:  Cailen Mihalik, JAI-GURMUKH  [facility] 07/20/2012, 11:54 AM     

## 2012-07-20 NOTE — Progress Notes (Signed)
NUTRITION FOLLOW UP  DOCUMENTATION CODES  Per approved criteria   -Severe malnutrition in the context of chronic illness    Intervention:   1. Continue Nepro Shakes  2. Recommend diet liberalization to encourage additional po intake 3. RD to continue to follow nutrition care plan  Nutrition Dx:   Inadequate oral intake related to abdominal pain 2/2 chronic pancreatitis as evidenced by ongoing weight loss. Ongoing.  Goal:   Consume 50% of meals and supplements; met (on average)  Monitor:   Weights, labs, PO intake, I/O's  Assessment:   GOC addressed on 1/30 - noted pt does not want nutritional support/tube feedings. Pt is able to tolerate 25-75% of soft foods. Plan for d/c to SNF.  Underwent RUQ US paracentesis 2/2. Continues with Nepro supplements.  Height: Ht Readings from Last 1 Encounters:  07/18/12 5\' 4"  (1.626 m)    Weight Status:   Wt Readings from Last 1 Encounters:  07/19/12 113 lb 5.1 oz (51.4 kg)    Re-estimated needs:  Kcal: 1600 - 1800 kcal Protein: 80-90 grams Fluid: 1.2 liters  Skin: intact  Diet Order: Renal 80-90; 1200 ml fluid restriction   Intake/Output Summary (Last 24 hours) at 07/20/12 0944 Last data filed at 07/19/12 1949  Gross per 24 hour  Intake    580 ml  Output   1900 ml  Net  -1320 ml    Last BM: 2/3   Labs:   Lab 07/20/12 0615 07/19/12 0809 07/18/12 0550 07/13/12 1344  NA 133* 128* 130* --  K 3.8 3.7 3.3* --  CL 98 93* 95* --  CO2 26 27 27  --  BUN 20 43* 31* --  CREATININE 2.06* 3.44* 2.75* --  CALCIUM 9.7 9.7 10.3 --  MG -- -- -- --  PHOS 3.3 5.1* -- 5.0*  GLUCOSE 91 100* 90 --    CBG (last 3)   Basename 07/17/12 2052  GLUCAP 133*    Scheduled Meds:   . aspirin EC  81 mg Oral Daily  . carvedilol  3.125 mg Oral BID WC  . darbepoetin  200 mcg Intravenous Q Tue-HD  . feeding supplement (NEPRO CARB STEADY)  237 mL Oral BID WC  . heparin  5,000 Units Subcutaneous Q8H  . methadone  10 mg Oral TID  .  micafungin (MYCAMINE) IV  100 mg Intravenous Q24H  . multivitamin  1 tablet Oral Daily  . pantoprazole  40 mg Oral Daily  . ranolazine  500 mg Oral Daily  . senna-docusate  1 tablet Oral BID  . sevelamer  400 mg Oral TID WC  . sodium chloride  3 mL Intravenous Q12H  . vancomycin  500 mg Intravenous Q T,Th,Sa-HD    Continuous Infusions:  none  Jarold Motto MS, RD, LDN Pager: [phone] After-hours pager: [phone]

## 2012-07-20 NOTE — Progress Notes (Signed)
Occupational Therapy Treatment Patient Details Name: [name] MRN: [id number] DOB: 03-10-1951 Today's Date: 07/20/2012 Time: 1000-1025 OT Time Calculation (min): 25 min  OT Assessment / Plan / Recommendation                   Plan Discharge plan remains appropriate    Precautions / Restrictions Precautions Precautions: Fall Restrictions Weight Bearing Restrictions: No       ADL  Grooming: Performed;Wash/dry face;Set up Where Assessed - Grooming: Unsupported sitting Toilet Transfer: Performed;Minimal assistance Toilet Transfer Method: Sit to stand;Stand pivot Toilet Transfer Equipment: Bedside commode Toileting - Clothing Manipulation and Hygiene: Simulated;Moderate assistance Where Assessed - Toileting Clothing Manipulation and Hygiene: Standing Transfers/Ambulation Related to ADLs: Pt had gotten sick after breakfast this morning, but was willing to get to chair.  Pt moving slowing and also complaining of abdomen pain        OT Goals ADL Goals ADL Goal: Grooming - Progress: Progressing toward goals ADL Goal: Toilet Transfer - Progress: Progressing toward goals ADL Goal: Toileting - Clothing Manipulation - Progress: Progressing toward goals  Visit Information  Last OT Received On: 07/20/12    Subjective Data  Subjective: I got sick this morning, but i will get in the chair if you want      Cognition  Cognition Overall Cognitive Status: Appears within functional limits for tasks assessed/performed Arousal/Alertness: Awake/alert Orientation Level: Appears intact for tasks assessed Behavior During Session: [facility] for tasks performed    Mobility  Bed Mobility Bed Mobility: Supine to Sit Supine to Sit: 3: Mod assist Details for Bed Mobility Assistance: Pt stated- you are going to have to help me today Transfers Transfers: Sit to Stand;Stand to Sit Sit to Stand: 4: Min assist;With upper extremity assist Stand to Sit: 4: Min assist;With upper extremity assist           End of Session OT - End of Session Activity Tolerance: Patient limited by fatigue Patient left: in chair;with call bell/phone within reach       [facility], Karin Golden D 07/20/2012, 10:33 AM

## 2012-07-20 NOTE — Progress Notes (Deleted)
Physician Discharge Summary  [name] ZOX:[id number] DOB: 02/26/1951 DOA: 07/12/2012  PCP: Jyl Heinz, MD  Admit date: 07/12/2012 Discharge date: 07/20/2012  Time spent: 25 minutes  Recommendations for Outpatient Follow-up:  1. Recommend followup with infectious disease clinic. Patient has appointment 07/26/12  2. Please followup on peritoneal fluid aspirate cultures done 07/17/12-these were negative x2 days on the day of discharge 3. Recommend basic metabolic panel and CBC in 3-5 days 4. Start date for antibiotics is 07/26/12 for both micafungin and vancomycin-further determination of antibiotic needs as per infectious disease  Discharge Diagnoses:  Active Problems:  Ischemic cardiomyopathy, EF 20-25% echo April 2013, now 10% by cath (05/06/12)  Chronic systolic congestive heart failure, NYHA class 3  LBBB (left bundle branch block)  Pancreatitis, chronic  ESRD on hemodialysis  Complication of vascular access for dialysis  Failure to thrive in adult  Constipation  Patient nonadherence   Discharge Condition: Stable  Diet recommendation: Renal  Filed Weights   07/19/12 0752 07/19/12 1211 07/19/12 2138  Weight: 50.4 kg (111 lb 1.8 oz) 48.6 kg (107 lb 2.3 oz) 51.4 kg (113 lb 5.1 oz)    History of present illness:  62 y.o. female with past medical history of ischemic cardiomyopathy, chronic systolic CHF with ejection fraction of 10%, ESRD and history of complicated infected pancreatic pseudocyst. She came in to the hospital because of shortness of breath. Patient said she completed her dialysis on Saturday, but she was feeling progressively weak and short of breath. Admitted 07/12/12 with shortness of breath Recently discharged 07/06/12 aspiration of infected pancreatic pseudocyst-culture showed enterococcus and yeast. Patient was on vancomycin, Fortaz, voriconazole as an outpatient  Hospital Course:  Shortness of breath  - No evidence of pneumonia, pulmonary edema per chest  x-ray.  - getting IHD, she appears stable from a volume standpoint  - continues to be stable and breathing comfortably on room air  Transaminitis  - may be related to her Voriconazole vs congestive hepatopathy 2/2 end stage heart failure vs liver pathology  - will d/c that and switch to Micafungin. ID consulted, appreciate further input.  - LFTs continue to improve off of Voriconazole, continue Micafungin for now for 7 more days to be done on 07/27/12.  Cirrhosis  - decompensated currently  - ascites seems that is bothering patient.  - US guided paracentesis on 07/18/12, patient tolerated procedure well.  Infected hemorrhagic pancreatic pseudocyst  - Status post aspiration on 06/28/2012, aspirate grew enterococcus and yeast.  - Patient was on Fortaz, vancomycin and voriconazole at home.  - This is being chronic problem, seen by general surgery previously and she refused surgical excision.  - ID following here, currently on Micafungin and Vancomycin with HD. Antibiotics to be done until 07/27/12. Will see ID on 07/26/12 and have further decisions regarding antibiotics.  -Follow Aspirate done 07/17/12 for cultures to determine therapy course ESRD  - On dialysis on Tuesday, Thursday and Saturday.  - nephrology following  Hyperkalemia  - on IHD  Chronic systolic CHF, severe  -With LVEF of 10% on 05/08/2012 by 2-D echocardiogram.  -Has edema and orthopnea, no significant pulmonary edema or pleural effusion.  -She is on Coreg, Imdur and hydralazine.  -She has ICD in place, she has LBBB  Chronic abdominal pain  -She is on methadone 10 mg tid daily.  Orla dilaudid changed back to PO Chronic abdominal pain secondary to chronic pancreatic bed problems.  -Had long standing history of chronic pancreatitis and infected pancreatic pseudocyst  -  abdominal pain improved after paracentesis  Disposition  -Patient said she is very weak, to the point she couldn't walk.  -being d/c to SNF  Consultants:   Nephrology  ID  Procedures:  None  Antibiotics (started last hospitalization ~06/29/12 with plans for a month)  Vancomycin 1/15 >> 07/26/12 Ceftazidime 1/15>>1/30  Voriconazole stopped 1/30  Micafungin 1/30 >>07/26/12  Discharge Exam: Filed Vitals:   07/19/12 1713 07/19/12 2138 07/20/12 0509 07/20/12 1102  BP: 109/56 100/60 105/70 100/64  Pulse: 79 74 76 73  Temp: 97.8 F (36.6 C) 97.6 F (36.4 C) 98.2 F (36.8 C) 97.4 F (36.3 C)  TempSrc: Oral Oral Oral Oral  Resp: 20 20 18 18   Height:      Weight:  51.4 kg (113 lb 5.1 oz)    SpO2: 98% 100% 100% 100%   Well no new issues. [place] is 10/10 but seated comfortably in bed No n/v/CP   General: EOMI Cardiovascular: s1 s2 M noted  Respiratory: clear  Discharge Instructions  Discharge Orders    Future Appointments: Provider: Department: Dept Phone: Center:   07/26/2012 2:00 PM Ginnie Smart, MD [facility]ious [facility] [phone] [facility]     Future Orders Please Complete By Expires   Diet - low sodium heart healthy      Increase activity slowly      Call MD for:  redness, tenderness, or signs of infection (pain, swelling, redness, odor or green/yellow discharge around incision site)      Call MD for:  temperature >100.4      Call MD for:  persistant nausea and vomiting      Call MD for:  severe uncontrolled pain          Medication List     As of 07/20/2012 11:54 AM    STOP taking these medications         voriconazole 200 MG tablet   Commonly known as: VFEND      TAKE these medications         aspirin 81 MG EC tablet   Take 1 tablet (81 mg total) by mouth daily.      carvedilol 6.25 MG tablet   Commonly known as: COREG   Take 1 tablet (6.25 mg total) by mouth 2 (two) times daily with a meal.      feeding supplement (NEPRO CARB STEADY) Liqd   Take 237 mLs by mouth 2 (two) times daily with breakfast and lunch.      hydrALAZINE 25 MG tablet   Commonly known as: APRESOLINE    Take 1 tablet (25 mg total) by mouth every 8 (eight) hours.      HYDROmorphone 2 MG tablet   Commonly known as: DILAUDID   Take 1 tablet (2 mg total) by mouth every 6 (six) hours as needed for pain. For pain      isosorbide mononitrate 30 MG 24 hr tablet   Commonly known as: IMDUR   Take 1 tablet (30 mg total) by mouth daily.      methadone 10 MG tablet   Commonly known as: DOLOPHINE   Take 1 tablet (10 mg total) by mouth 4 (four) times daily.      multivitamin Tabs tablet   Take 1 tablet by mouth daily.      pantoprazole 40 MG tablet   Commonly known as: PROTONIX   Take 40 mg by mouth daily.      pravastatin 20 MG tablet  Commonly known as: PRAVACHOL   Take 20 mg by mouth at bedtime.      ranolazine 500 MG 12 hr tablet   Commonly known as: RANEXA   Take 500 mg by mouth daily.      sevelamer 400 MG tablet   Commonly known as: RENAGEL   Take 400 mg by mouth daily. With a meal      sodium chloride 0.9 % SOLN 100 mL with micafungin 50 MG SOLR 100 mg   Inject 100 mg into the vein daily.      sodium chloride 0.9 % SOLN 100 mL with vancomycin 500 MG SOLR 500 mg   Inject 500 mg into the vein Every Tuesday,Thursday,and Saturday with dialysis.           Follow-up Information    Follow up with Jyl Heinz, MD.   Contact information:   [address] [place] [address] [phone]       Follow up with Johny Sax, MD. (has appt 07/26/12-call offcie for details)    Contact information:   [address]  [address] [place] [address] [phone]           The results of significant diagnostics from this hospitalization (including imaging, microbiology, ancillary and laboratory) are listed below for reference.    Significant Diagnostic Studies: Ct Abdomen Pelvis Wo Contrast  07/12/2012  *RADIOLOGY REPORT*  Clinical Data: Shortness of breath, intermittent abdominal pain and nausea.  Recent history of necrotizing  pancreatitis.  CT ABDOMEN AND PELVIS WITHOUT CONTRAST  Technique:  Multidetector CT imaging of the abdomen and pelvis was performed following the standard protocol without intravenous contrast.  Comparison: None.  Findings: The patient declined oral contrast.  Streak artifact from defibrillator leads noted.  Marked enlargement of the heart again noted.  Trace right pleural effusion with associated compressive atelectasis noted.  This is stable compared to the prior exam. Left basilar dependent atelectasis again noted.  There has been slight decrease in abdominal ascites since previously. Cholecystectomy clips noted.  The pancreas is ill- defined with a more focal area of hypodensity in the region of the tail which could represent an abscess or other fluid collection, for example image 26.  Scattered foci of peritoneal gas are again noted.  Subjectively, this is minimally decreased since previously. Extensive vascular calcification is noted.  The lack of contrast, vascular patency cannot be assessed to determine interval change in previously seen portal vein thrombosis.  There is diffuse mild prominence of the ascending colon and proximal transverse colon, for example image 40, but this is difficult to separate from surrounding fluid.  No pneumatosis is identified. This finding appears new since prior exam.  Large pelvic ascites is noted.  Uterus and ovaries not visualized. Bladder is decompressed.  It is not well visualized separate from large pelvic ascites, but is visualized on coronal and sagittal re- formations, for example coronal image 35 and sagittal image 78. Large amount of stool noted within the rectum.  Anasarca/third spacing noted.  Due to lack of IV contrast, this significantly decreases conspicuity of detail within the solid and hollow viscera.  IMPRESSION: Interval development of ascending colonic and proximal transverse colon wall thickening, which differential considerations include ischemia,  infectious/inflammatory colitis, or less likely edema related to anasarca/third spacing.  Slight decrease in ascites.  Slight decrease in peripancreatic gas.  Persistent hypodensity in the tail of the pancreas which could represent a pseudocyst or peripancreatic abscess.  Stable right pleural effusion and bibasilar atelectasis.  Suboptimal visualization due to lack of oral and IV contrast.   Original Report Authenticated By: Christiana Pellant, M.D.    Dg Chest 2 View  06/25/2012  *RADIOLOGY REPORT*  Clinical Data: Abdominal pain  CHEST - 2 VIEW  Comparison: 06/02/2012  Findings: There is a right chest wall dialysis catheter with tip in the right atrium.  Left chest wall AICD is noted with leads in the right atrial appendage, coronary sinus and right ventricle. Previous median sternotomy and CABG procedure.  There is marked cardiac enlargement. The small pleural effusions and mild interstitial edema noted.  IMPRESSION:  1.  Marked cardiac enlargement. 2.  Small pleural effusions and mild interstitial edema suggestive of CHF.   Original Report Authenticated By: Signa Kell, M.D.    US Abdomen Complete  07/14/2012  *RADIOLOGY REPORT*  Clinical Data:  63 year old female with abdominal pain and elevated LFTs.  History of cholecystectomy, cirrhosis, chronic pancreatitis, cardiomyopathy and end-stage renal disease.  ABDOMINAL ULTRASOUND COMPLETE  Comparison:  07/12/2012 and prior CTs.  Findings:  Gallbladder:  The gallbladder is not visualized compatible with cholecystectomy.  Common Bile Duct:  There is no evidence of intrahepatic or extrahepatic biliary dilation. The CBD measures 6.0 mm in greatest diameter.  Liver:  The liver is heterogeneous in echotexture with increased echogenicity. No focal abnormalities are identified.  IVC:  Appears normal.  Pancreas: A cyst within the pancreatic tail appears unchanged.  Spleen:  Within normal limits in size and echotexture.  Right kidney: The right kidney is atrophic and  diffusely echogenic measuring 7 cm.  There is no evidence of hydronephrosis or definite renal mass.  Left kidney: The left kidney is atrophic and diffusely echogenic measuring 7 cm.  There is no evidence of hydronephrosis or definite renal mass. Several cysts are present.  Abdominal Aorta:  No abdominal aortic aneurysm identified.  Ascites and bilateral pleural effusions are noted.  IMPRESSION:  Heterogeneous and increased echogenicity of the liver which can be seen with this patient's history of cirrhosis and fatty infiltration.  Chronic medical renal disease.  Ascites and bilateral pleural effusions.   Original Report Authenticated By: Harmon Pier, M.D.    Ct Abdomen Pelvis W Contrast  06/26/2012  *RADIOLOGY REPORT*  Clinical Data: Worsening abdominal pain.  The patient has a history of complex infected pseudocyst with pancreatic necrosis.  Last admission 05/22/2012.  History of chronic pancreatitis.  CT ABDOMEN AND PELVIS WITH CONTRAST  Technique:  Multidetector CT imaging of the abdomen and pelvis was performed following the standard protocol during bolus administration of intravenous contrast.  Contrast: 80mL OMNIPAQUE IOHEXOL 300 MG/ML  SOLN  Comparison: 05/24/2012  Findings: Small right pleural effusion.  Atelectasis or infiltration in both lung bases.  Diffuse cardiac enlargement. Postoperative changes in the mediastinum.  There is diffuse abdominal and pelvic fluid consistent with ascites, increasing since previous study.  Diffuse subcutaneous soft tissue edema.  The liver demonstrates diffuse central hypo perfusion pattern similar to previous study.  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  Spleen size is normal.  Gallbladder is surgically absent.  Visualization of the pancreas is limited due to obscuration by the surrounding fluid.  There appear to be gas collections throughout the pancreas or peripancreatic tissues with gas and fluid collection again demonstrated at the left upper quadrant  extending from the region of the pancreatic tail into the splenic hilum.  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  Gas and fluid collections  appear to be increasing mildly since the previous study. No discrete drainable collection is identified.  Visualization of bowel is limited due to limited opacification.  However, there is suggestion of mild wall thickening consistent with edema in the jejunum.  Contrast material does pass through the colon suggesting no evidence of obstruction. The stomach is decompressed.  The colon is stool-filled.  The kidneys demonstrate small parenchymal cysts.  There is no hydronephrosis.  There is no contrast material shown in the renal collecting systems on the delayed images, suggesting possible delayed appearance but delayed images were obtained only 2-3 minutes after arterial phase in this may be artifactual.  Delayed appearance of contrast material in the renal collecting system cannot suggest renal hypoperfusion or medical renal disease.  Ascites extends into the pelvis.  The bladder is decompressed. Pelvic organs are not visualized.  Scattered diverticula in the sigmoid colon without definite diverticulitis.  The appendix is normal.  Normal alignment of the lumbar vertebrae without compression deformity or expansile destructive change.  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  Ascites.  Abnormal hepatic parenchymal perfusion with diffuse portal venous thrombosis.  Changes have progressed since previous study.  Probable edema in the jejunum which might be related to ascites.  Delayed appearance of contrast material in the renal collecting systems suggest renal parenchymal disease.   Original Report Authenticated By: Burman Nieves, M.D.    Ct Aspiration  07/01/2012  *RADIOLOGY REPORT*  CT GUIDED ASPIRATION  Date: 06/28/2012  Clinical History: 62 year old female with a history of  pancreatitis and peripancreatic fluid and gas collections concerning for retroperitoneal abscess.  Procedures Performed: 1. CT guided aspiration of left retroperitoneal fluid gas collection  Interventional Radiologist:  Sterling Big, MD  Sedation: Moderate (conscious) sedation was used.  25 mg Versed, one mcg Fentanyl were administered intravenously.  The patient's vital signs were monitored continuously by radiology nursing throughout the procedure.  Sedation Time: 82 minutes  Fluoroscopy time: 3 seconds  Contrast volume: None  PROCEDURE/FINDINGS:   Informed consent was obtained from the patient following explanation of the procedure, risks, benefits and alternatives. The patient understands, agrees and consents for the procedure. All questions were addressed. A time out was performed.  Maximal barrier sterile technique utilized including caps, mask, sterile gowns, sterile gloves, large sterile drape, hand hygiene, and betadine skin prep.  A planning axial CT scan was performed.  A small fluid and gas collection in the left retroperitoneal space just anterior to the lower pole of the spleen was successfully identified.  A suitable skin entry site was selected and marked. Local anesthesia was achieved by infiltration of 1% lidocaine.  Under direct CT fluoroscopic guidance in 18 gauge trocar needle was advanced into the collection.  A total of 15 ml of opaque white/pain and fluid was successfully aspirated.  The needle was removed.  The fluid will be sent for both bacterial and fungal culture.  The patient tolerated the procedure well, there is no immediate complication.  IMPRESSION:  Technically successful CT-guided aspiration of left retroperitoneal fluid and gas collection.  15 ml of opaque, white/tan material was successfully aspirated and sent for culture.  Signed,  Sterling Big, MD Vascular & Interventional Radiologist [facility] Radiology   Original Report Authenticated By: Malachy Moan, M.D.     US Paracentesis  07/17/2012  *RADIOLOGY REPORT*  Clinical data: Cirrhosis, recurrent ascites, abdominal distention.  ULTRASOUND GUIDED PARACENTESIS  Technique: Survey ultrasound of the abdomen was performed and an appropriate  skin entry site in the right upper abdomen was selected. Skin site was marked, prepped with Betadine, and draped in usual sterile fashion, and infiltrated locally with 1% lidocaine. A 5 French multisidehole Yueh sheath needle was advanced into the peritoneal space until fluid could be aspirated. The sheath was advanced and the needle removed. 2.1 liters of slightly cloudy yellow thin ascites were aspirated. No immediate complication.  IMPRESSION Technically successful ultrasound guided paracentesis, removing 2.1 liters  of ascites   Original Report Authenticated By: D. Andria Rhein, MD    US Paracentesis  07/01/2012  *RADIOLOGY REPORT*  Clinical Data: Abdominal pain secondary to pancreatitis with pseudocyst, ascites  ULTRASOUND GUIDED PARACENTESIS  Comparison:  None  An ultrasound guided paracentesis was thoroughly discussed with the patient and questions answered.  The benefits, risks, alternatives and complications were also discussed.  The patient understands and wishes to proceed with the procedure.  Written consent was obtained.  Ultrasound was performed to localize and mark an adequate pocket of fluid in the left lower quadrant of the abdomen.  The area was then prepped and draped in the normal sterile fashion.  1% Lidocaine was used for local anesthesia.  Under ultrasound guidance a 19 gauge Yueh catheter was introduced.  Paracentesis was performed.  The catheter was removed and a dressing applied.  Complications:  None  Findings:  A total of approximately 3 liters of amber colored fluid was removed.  A fluid sample was not sent for laboratory analysis.  IMPRESSION: Successful ultrasound guided paracentesis yielding 3 liters of ascites.  Read by Brayton El PA-C   Original  Report Authenticated By: Malachy Moan, M.D.    Dg Chest Port 1 View  07/12/2012  *RADIOLOGY REPORT*  Clinical Data: Short of breath  PORTABLE CHEST - 1 VIEW  Comparison: 06/25/2012  Findings: Prominent cardiomegaly is stable.  Left subclavian AICD device and leads are stable.  Right internal jugular dialysis catheter stable.  Stable vascular congestion.  No pulmonary edema. Tiny pleural effusions stable.  IMPRESSION: Improved edema.  Mild vascular congestion persist.   Original Report Authenticated By: Jolaine Click, M.D.     Microbiology: Recent Results (from the past 240 hour(s))  URINE CULTURE     Status: Normal   Collection Time   07/12/12  1:32 PM      Component Value Range Status Comment   Specimen Description URINE, CATHETERIZED   Final    Special Requests NONE   Final    Culture  Setup Time 07/12/2012 14:21   Final    Colony Count NO GROWTH   Final    Culture NO GROWTH   Final    Report Status 07/13/2012 FINAL   Final   BODY FLUID CULTURE     Status: Normal (Preliminary result)   Collection Time   07/17/12 10:47 AM      Component Value Range Status Comment   Specimen Description ASCITIC   Final    Special Requests NONE   Final    Gram Stain     Final    Value: CYTOSPIN NO WBC SEEN     NO ORGANISMS SEEN   Culture NO GROWTH 2 DAYS   Final    Report Status PENDING   Incomplete      Labs: Basic Metabolic Panel:  Lab 07/20/12 1610 07/19/12 0809 07/18/12 0550 07/16/12 0740 07/15/12 0630 07/13/12 1344  NA 133* 128* 130* 134* 136 --  K 3.8 3.7 3.3* 3.4* 3.2* --  CL 98 93* 95* 97  99 --  CO2 26 27 27 28 29  --  GLUCOSE 91 100* 90 117* 112* --  BUN 20 43* 31* 32* 21 --  CREATININE 2.06* 3.44* 2.75* 2.82* 2.10* --  CALCIUM 9.7 9.7 10.3 10.0 9.5 --  MG -- -- -- -- -- --  PHOS 3.3 5.1* -- -- -- 5.0*   Liver Function Tests:  Lab 07/20/12 0615 07/19/12 0809 07/18/12 0550 07/16/12 0740 07/15/12 0630 07/14/12 0650  AST 22 -- 36 114* 393* 945*  ALT 49* -- 86* 157* 263* 358*   ALKPHOS 174* -- 174* 187* 215* 237*  BILITOT 0.3 -- 0.5 0.4 0.4 0.4  PROT 6.5 -- 6.6 6.2 6.3 6.4  ALBUMIN 1.5* 1.4* 1.5* 1.5* 1.6* --   No results found for this basename: LIPASE:5,AMYLASE:5 in the last 168 hours No results found for this basename: AMMONIA:5 in the last 168 hours CBC:  Lab 07/20/12 0615 07/19/12 0809 07/18/12 0550 07/16/12 0740 07/15/12 0630  WBC 8.9 9.3 8.2 12.2* 8.7  NEUTROABS -- -- -- -- --  HGB 9.2* 8.9* 9.7* 8.0* 8.6*  HCT 30.6* 28.0* 31.5* 25.9* 27.9*  MCV 90.8 88.9 91.3 91.5 89.7  PLT 137* 135* 105* 124* 129*   Cardiac Enzymes: No results found for this basename: CKTOTAL:5,CKMB:5,CKMBINDEX:5,TROPONINI:5 in the last 168 hours BNP: BNP (last 3 results)  Basename 06/02/12 1944 05/08/12 0515 04/21/12 1419  PROBNP >70000.0* >70000.0* >70000.0*   CBG:  Lab 07/17/12 2052 07/14/12 2100 07/14/12 1642 07/14/12 0734 07/13/12 2036  GLUCAP 133* 109* 126* 82 109*       Signed:  Feras Gardella, JAI-GURMUKH  [facility] 07/20/2012, 11:54 AM

## 2012-07-20 NOTE — Progress Notes (Signed)
Pt prepared to discharge to facility. Pt's family member waiting to take her facility. IV removed. Discharge packet given to pt. Pt told to present packet to SNF upon arrival; pt verbalized understanding. Pt remains stable. Pt taken via wheelchair to exit of facility. [name], [name] Randy

## 2012-07-20 NOTE — Progress Notes (Signed)
PT Cancellation Note  Patient Details Name: [name] MRN: [id number] DOB: [birth year]   Cancelled Treatment:    Reason Eval/Treat Not Completed: Other (comment) Patient eating lunch and reports to be transported out to rehab little later this afternoon.  Will sign off due to planned d/c to SNF later this pm.   Maria Gallicchio,CYNDI 07/20/2012, 1:34 PM

## 2012-07-21 LAB — BODY FLUID CULTURE

## 2012-07-22 ENCOUNTER — Other Ambulatory Visit: Payer: Self-pay | Admitting: Internal Medicine

## 2012-07-22 ENCOUNTER — Other Ambulatory Visit: Payer: Self-pay | Admitting: Radiology

## 2012-07-22 DIAGNOSIS — B999 Unspecified infectious disease: Secondary | ICD-10-CM

## 2012-07-25 ENCOUNTER — Ambulatory Visit (HOSPITAL_COMMUNITY): Admission: RE | Admit: 2012-07-25 | Payer: Non-veteran care | Source: Ambulatory Visit

## 2012-07-26 ENCOUNTER — Inpatient Hospital Stay: Payer: Non-veteran care | Admitting: Infectious Diseases

## 2012-07-28 NOTE — Consult Note (Signed)
I have reviewed and discussed the care of this patient in detail with the nurse practitioner including pertinent patient records, physical exam findings and data. I agree with details of this encounter.  

## 2012-07-29 LAB — MISCELLANEOUS TEST

## 2012-08-03 ENCOUNTER — Ambulatory Visit (INDEPENDENT_AMBULATORY_CARE_PROVIDER_SITE_OTHER): Payer: Medicare Other | Admitting: Infectious Diseases

## 2012-08-03 ENCOUNTER — Encounter: Payer: Self-pay | Admitting: Infectious Diseases

## 2012-08-03 VITALS — BP 100/64 | HR 88 | Temp 98.1°F | Ht 64.0 in | Wt 120.0 lb

## 2012-08-03 DIAGNOSIS — K859 Acute pancreatitis without necrosis or infection, unspecified: Secondary | ICD-10-CM

## 2012-08-03 NOTE — Assessment & Plan Note (Addendum)
As previously noted, curative surgery for her is out of reach due to her medical conditions. She also previously refused any further surgeries at last hospitalization. She is off anbx and is doing well for now. There are no good oral options for her yeast, will watch her symptomatically. She is doing well at the skilled facility. She is advised to call if she has worsening distension, pain, fever. She is hopeful to get home soon.

## 2012-08-03 NOTE — Progress Notes (Signed)
  Subjective:    Patient ID: [name], female    DOB: January 02, 1951, 62 y.o.   MRN: [id number]  HPI 62 yo F with hx CHF class 3, CKD III, and of recurrent/chronic pancreatic abscess (prev Cx include C glabrata) and cirrhosis. She was hospitalized 1-11 to 1-23 for worsening abd pain. She underwent IR aspirate of her L retroperitoneal fluid collection on 1-14 and the Cx of this grew Enterococcus (pan-sens and yeast, sens pending). She was treated with vanco/ceftaz as well as mycafungin while in hospital. She was d/c home 07-07-12 and given a Rx for voriconazole to replace this.  She felt poorly at home and returned to hospital on 1-28, was afebrile, felt to be volume overloaded, found to have K+ 6.3. She was found on LFTs today to have AST 954 abd ALT 358. This was felt to be due to voriconazole. This was stopped and she was treated with mycfungin and vancomycin. She was d/c home on 2-5 (LFTs normal) and was to complete anbx on 2-11.  Yeast from 1-14 grew CANDIDA GLABRATA  ANTIFUNGAL SUSCEPTIBILITY TESTING (CLSI M27-A3)  AMB 0.25 (NO ESTABLISHED BREAKPOINTS)  CAS 0.5 RESISTANT  FLU 64 RESISTANT  VORI 1 (NO ESTABLISEHD BREAKPOINTS)  Has been feeling well, doing PT, OT. LE edema is improving. Pain in abd is stable (calls her pancreas "Joe"). Hasn't been sick. Has not had fever or chills. Denies abd distension. On fluid restriction.  Wt up 7# since in hospital. Off anbx now. No problems with HD catheter.   Review of Systems  Constitutional: Negative for fever, chills, appetite change and unexpected weight change.  Gastrointestinal: Positive for abdominal pain. Negative for diarrhea and constipation.  Genitourinary: Negative for dysuria.  see HPI     Objective:   Physical Exam  Constitutional: She appears well-developed. She appears cachectic.  HENT:  Mouth/Throat: No oropharyngeal exudate.  Eyes: EOM are normal. Pupils are equal, round, and reactive to light.  Neck: Neck supple.    Cardiovascular: Normal rate and regular rhythm.   No murmur heard.   Pulmonary/Chest: Effort normal and breath sounds normal.  Abdominal: Soft. Bowel sounds are normal. She exhibits distension. There is no tenderness.  Musculoskeletal: She exhibits edema.  Lymphadenopathy:    She has no cervical adenopathy.          Assessment & Plan:

## 2012-08-05 LAB — FUNGUS CULTURE W SMEAR

## 2012-08-13 ENCOUNTER — Inpatient Hospital Stay (HOSPITAL_COMMUNITY)
Admission: EM | Admit: 2012-08-13 | Discharge: 2012-09-03 | DRG: 291 | Disposition: A | Discharge status: Other Acute Inpatient Hospital | Payer: Medicare Other | Attending: Internal Medicine | Admitting: Internal Medicine

## 2012-08-13 ENCOUNTER — Emergency Department (HOSPITAL_COMMUNITY): Payer: Medicare Other

## 2012-08-13 ENCOUNTER — Encounter (HOSPITAL_COMMUNITY): Payer: Self-pay | Admitting: Emergency Medicine

## 2012-08-13 DIAGNOSIS — Z7982 Long term (current) use of aspirin: Secondary | ICD-10-CM

## 2012-08-13 DIAGNOSIS — Z951 Presence of aortocoronary bypass graft: Secondary | ICD-10-CM

## 2012-08-13 DIAGNOSIS — T4275XA Adverse effect of unspecified antiepileptic and sedative-hypnotic drugs, initial encounter: Secondary | ICD-10-CM | POA: Diagnosis not present

## 2012-08-13 DIAGNOSIS — L02219 Cutaneous abscess of trunk, unspecified: Secondary | ICD-10-CM | POA: Diagnosis not present

## 2012-08-13 DIAGNOSIS — R0602 Shortness of breath: Secondary | ICD-10-CM | POA: Diagnosis present

## 2012-08-13 DIAGNOSIS — R630 Anorexia: Secondary | ICD-10-CM | POA: Diagnosis present

## 2012-08-13 DIAGNOSIS — R509 Fever, unspecified: Secondary | ICD-10-CM | POA: Diagnosis present

## 2012-08-13 DIAGNOSIS — R1012 Left upper quadrant pain: Secondary | ICD-10-CM

## 2012-08-13 DIAGNOSIS — Z9119 Patient's noncompliance with other medical treatment and regimen: Secondary | ICD-10-CM

## 2012-08-13 DIAGNOSIS — K861 Other chronic pancreatitis: Secondary | ICD-10-CM | POA: Diagnosis present

## 2012-08-13 DIAGNOSIS — F19921 Other psychoactive substance use, unspecified with intoxication with delirium: Secondary | ICD-10-CM | POA: Diagnosis not present

## 2012-08-13 DIAGNOSIS — B9689 Other specified bacterial agents as the cause of diseases classified elsewhere: Secondary | ICD-10-CM | POA: Diagnosis not present

## 2012-08-13 DIAGNOSIS — R5383 Other fatigue: Secondary | ICD-10-CM

## 2012-08-13 DIAGNOSIS — D72829 Elevated white blood cell count, unspecified: Secondary | ICD-10-CM | POA: Diagnosis present

## 2012-08-13 DIAGNOSIS — N2581 Secondary hyperparathyroidism of renal origin: Secondary | ICD-10-CM | POA: Diagnosis present

## 2012-08-13 DIAGNOSIS — I252 Old myocardial infarction: Secondary | ICD-10-CM

## 2012-08-13 DIAGNOSIS — J189 Pneumonia, unspecified organism: Secondary | ICD-10-CM | POA: Diagnosis present

## 2012-08-13 DIAGNOSIS — K631 Perforation of intestine (nontraumatic): Secondary | ICD-10-CM

## 2012-08-13 DIAGNOSIS — E8770 Fluid overload, unspecified: Secondary | ICD-10-CM | POA: Diagnosis present

## 2012-08-13 DIAGNOSIS — Y9210 Unspecified residential institution as the place of occurrence of the external cause: Secondary | ICD-10-CM | POA: Diagnosis not present

## 2012-08-13 DIAGNOSIS — Z79899 Other long term (current) drug therapy: Secondary | ICD-10-CM

## 2012-08-13 DIAGNOSIS — I2510 Atherosclerotic heart disease of native coronary artery without angina pectoris: Secondary | ICD-10-CM | POA: Diagnosis present

## 2012-08-13 DIAGNOSIS — Z9861 Coronary angioplasty status: Secondary | ICD-10-CM

## 2012-08-13 DIAGNOSIS — Z992 Dependence on renal dialysis: Secondary | ICD-10-CM

## 2012-08-13 DIAGNOSIS — I5023 Acute on chronic systolic (congestive) heart failure: Principal | ICD-10-CM

## 2012-08-13 DIAGNOSIS — A491 Streptococcal infection, unspecified site: Secondary | ICD-10-CM

## 2012-08-13 DIAGNOSIS — I120 Hypertensive chronic kidney disease with stage 5 chronic kidney disease or end stage renal disease: Secondary | ICD-10-CM | POA: Diagnosis present

## 2012-08-13 DIAGNOSIS — K7460 Unspecified cirrhosis of liver: Secondary | ICD-10-CM | POA: Diagnosis present

## 2012-08-13 DIAGNOSIS — I472 Ventricular tachycardia, unspecified: Secondary | ICD-10-CM | POA: Diagnosis not present

## 2012-08-13 DIAGNOSIS — R188 Other ascites: Secondary | ICD-10-CM | POA: Diagnosis present

## 2012-08-13 DIAGNOSIS — IMO0002 Reserved for concepts with insufficient information to code with codable children: Secondary | ICD-10-CM

## 2012-08-13 DIAGNOSIS — I447 Left bundle-branch block, unspecified: Secondary | ICD-10-CM

## 2012-08-13 DIAGNOSIS — I509 Heart failure, unspecified: Secondary | ICD-10-CM

## 2012-08-13 DIAGNOSIS — E1169 Type 2 diabetes mellitus with other specified complication: Secondary | ICD-10-CM | POA: Diagnosis not present

## 2012-08-13 DIAGNOSIS — Z91199 Patient's noncompliance with other medical treatment and regimen due to unspecified reason: Secondary | ICD-10-CM

## 2012-08-13 DIAGNOSIS — I2589 Other forms of chronic ischemic heart disease: Secondary | ICD-10-CM | POA: Diagnosis present

## 2012-08-13 DIAGNOSIS — I10 Essential (primary) hypertension: Secondary | ICD-10-CM

## 2012-08-13 DIAGNOSIS — N189 Chronic kidney disease, unspecified: Secondary | ICD-10-CM

## 2012-08-13 DIAGNOSIS — E43 Unspecified severe protein-calorie malnutrition: Secondary | ICD-10-CM

## 2012-08-13 DIAGNOSIS — Z87891 Personal history of nicotine dependence: Secondary | ICD-10-CM

## 2012-08-13 DIAGNOSIS — N186 End stage renal disease: Secondary | ICD-10-CM

## 2012-08-13 DIAGNOSIS — I8510 Secondary esophageal varices without bleeding: Secondary | ICD-10-CM

## 2012-08-13 DIAGNOSIS — K863 Pseudocyst of pancreas: Secondary | ICD-10-CM | POA: Diagnosis not present

## 2012-08-13 DIAGNOSIS — R627 Adult failure to thrive: Secondary | ICD-10-CM | POA: Diagnosis not present

## 2012-08-13 DIAGNOSIS — I255 Ischemic cardiomyopathy: Secondary | ICD-10-CM

## 2012-08-13 DIAGNOSIS — I059 Rheumatic mitral valve disease, unspecified: Secondary | ICD-10-CM | POA: Diagnosis present

## 2012-08-13 DIAGNOSIS — Z95810 Presence of automatic (implantable) cardiac defibrillator: Secondary | ICD-10-CM

## 2012-08-13 DIAGNOSIS — E871 Hypo-osmolality and hyponatremia: Secondary | ICD-10-CM | POA: Diagnosis present

## 2012-08-13 DIAGNOSIS — K862 Cyst of pancreas: Secondary | ICD-10-CM | POA: Diagnosis not present

## 2012-08-13 DIAGNOSIS — I4729 Other ventricular tachycardia: Secondary | ICD-10-CM | POA: Diagnosis not present

## 2012-08-13 DIAGNOSIS — D631 Anemia in chronic kidney disease: Secondary | ICD-10-CM | POA: Diagnosis present

## 2012-08-13 DIAGNOSIS — Z66 Do not resuscitate: Secondary | ICD-10-CM | POA: Diagnosis present

## 2012-08-13 DIAGNOSIS — I5022 Chronic systolic (congestive) heart failure: Secondary | ICD-10-CM

## 2012-08-13 DIAGNOSIS — E162 Hypoglycemia, unspecified: Secondary | ICD-10-CM | POA: Diagnosis present

## 2012-08-13 DIAGNOSIS — I214 Non-ST elevation (NSTEMI) myocardial infarction: Secondary | ICD-10-CM

## 2012-08-13 LAB — CBC WITH DIFFERENTIAL/PLATELET
Basophils Absolute: 0.1 10*3/uL (ref 0.0–0.1)
Basophils Relative: 0 % (ref 0–1)
Eosinophils Relative: 0 % (ref 0–5)
HCT: 24.7 % — ABNORMAL LOW (ref 36.0–46.0)
MCH: 28.8 pg (ref 26.0–34.0)
MCHC: 31.6 g/dL (ref 30.0–36.0)
MCV: 91.1 fL (ref 78.0–100.0)
Monocytes Absolute: 1.4 10*3/uL — ABNORMAL HIGH (ref 0.1–1.0)
Monocytes Relative: 7 % (ref 3–12)
RDW: 18.8 % — ABNORMAL HIGH (ref 11.5–15.5)

## 2012-08-13 LAB — COMPREHENSIVE METABOLIC PANEL
AST: 20 U/L (ref 0–37)
Albumin: 1.7 g/dL — ABNORMAL LOW (ref 3.5–5.2)
BUN: 79 mg/dL — ABNORMAL HIGH (ref 6–23)
Calcium: 10.1 mg/dL (ref 8.4–10.5)
Creatinine, Ser: 4.77 mg/dL — ABNORMAL HIGH (ref 0.50–1.10)
GFR calc non Af Amer: 9 mL/min — ABNORMAL LOW (ref >90)

## 2012-08-13 LAB — LIPASE, BLOOD: Lipase: 24 U/L (ref 11–59)

## 2012-08-13 MED ORDER — HYDROMORPHONE HCL PF 2 MG/ML IJ SOLN
2.0000 mg | Freq: Once | INTRAMUSCULAR | Status: AC
  Administered 2012-08-14: 2 mg via INTRAVENOUS
  Filled 2012-08-13: qty 1

## 2012-08-13 NOTE — ED Notes (Signed)
[facility] presents with a 63 yo female from home with SOB.  Pt complained about being SOB all last night and day today.  Pt appeared to be no distress or discomfort while in route to this facility as reported by [facility].  Hx. Of pneumonia and CHF/with dialysis fistula on right arm and pacemaker on left chest.

## 2012-08-13 NOTE — ED Provider Notes (Signed)
History     CSN: [id number]  Arrival date & time 08/13/12  2008   First MD Initiated Contact with Patient 08/13/12 2056      Chief Complaint  Patient presents with  . Shortness of Breath    (Consider location/radiation/quality/duration/timing/severity/associated sxs/prior treatment) Patient is a 62 y.o. female presenting with shortness of breath.  Shortness of Breath  [name] is 62yo woman complicated pmh most significant for CHF NYHA 3, ESRD on HD (usually T, H, S), cirrohosis, and chronic pancreatic psuedocyst infection. Pt presents from SNF today for SOB. Pt didn't go to dialysis today because of SOB. She doesn't require oxygen at the facility but did require some overnight per pt report. She has been having some night sweats that started yesterday but no chills. Dry cough also for the past 2-3 days. Pt has chronic abdominal pain and that is not increased from baseline. She only makes minimal urine and is having some new RLQ and LLQ pain. She denied any vomiting or diarrhea. She is DNR/DNI does not want CT/MRI imaging and no surgical intervention but is ok with IV Abx and lab draws at this point.   Past Medical History  Diagnosis Date  . Coronary artery disease     s/p CABG 2008 with multiple PCIs  . Ischemic cardiomyopathy     Cath 04/2012, significant calcifications  . History of nonadherence to medical treatment   . Systolic CHF     13/0865 echo EF 10%  . Esophageal varices   . Cirrhosis     hx of fatty liver per patient, has known ascites with repeated paracenteses at [facility] as of 2013, also +hx of esoph varices with banding in the past  . Pacemaker   . ICD (implantable cardiac defibrillator) in place   . Hypertension     "used to have HTN; now I'm low" (05/23/2012)  . NSTEMI (non-ST elevated myocardial infarction)     04/2012; Trop peaked to 1.39; 05/23/2012 pt denies ever having MI  . Pneumonia 02/2012    "first time ever" (05/23/2012)  . Chronic bronchitis 1970's thru 2002     "went away when I stopped smoking in 2002" (05/23/2012)  . Diabetes mellitus     Diet controlled  . History of blood transfusion     "alot of them" (05/23/2012)  . Iron (Fe) deficiency anemia     "severe" (05/23/2012)  . Pancreatitis, chronic 10/04/2011    Had severe gallstone pancreatitis in May 2003, no surgery, treated medically then returned in June 2003 for cholecystectomy and cyst gastrostomy for pancreatic pseudocyst. In March 2013 was admitted for hemorrhage into psueodcyst. In April 2013 was treated at [facility] for polymicrobial bacteremia (fungal, MRSA and enterobacter) felt to be due to ruptured pseudocyst, treated medically. Admitted Jan 2014 with abd pain and had gas and fluid in pancreatic bed. She refused surgery ("would not make it") and had fluid aspirated by IR- gram stain showed yeast and cultures grew enterococcus species, sensitive to amp and vanc. She was seen by ID and discharged on IV vanc/fortaz with HD and po voriconazole.     [name] ESRD on hemodialysis 05/06/2012    T,Th,Sat HD by [facility] on [address]. Started hemodialysis around July 2013.    [name] CHF (congestive heart failure)   . Complication of vascular access for dialysis 07/13/2012    L arm AVG (placed by Dr. Lyda Perone @ [facility]) was ligated 1/6 after significant L arm swelling secondary to L SCV stenosis  and pacemaker; new access to be placed at right arm on 1/29 by Dr. Hollace Hayward      Past Surgical History  Procedure Laterality Date  . Esophagogastroduodenoscopy  09/15/2011    Procedure: ESOPHAGOGASTRODUODENOSCOPY (EGD);  Surgeon: Theda Belfast, MD;  Location: [facility] ENDOSCOPY;  Service: Endoscopy;  Laterality: N/A;  . Cholecystectomy  2003  . Tonsillectomy and adenoidectomy  1961  . Appendectomy  1973?  [name] Vaginal hysterectomy  1973?  [name] Tubal ligation  1972  . Dilation and curettage of uterus      "bunch from profuse bleeding in the 1970's" (05/23/2012)  . Coronary artery bypass graft  2008    CABG X4   . Coronary angioplasty with stent placement  2008    "1; day after CABG" (05/23/2012)  . Cardiac catheterization      "before 2008 and 3 wk ago" (05/23/2012)  . Insert / replace / remove pacemaker  06/2011    pacemaker ICD  . Cardiac defibrillator placement  06/2011  . Arteriovenous graft placement  03/2012    right antecub  . Insertion of dialysis catheter  ~ 10/2011    right chest  . Peritoneal catheter insertion  08/2011  . Peritoneal catheter removal  ~ 10/2011    Family History  Problem Relation Age of Onset  . Coronary artery disease Mother   . Hypertension Mother   . Diabetes Mother   . Diabetes Sister   . Anesthesia problems Neg Hx     History  Substance Use Topics  . Smoking status: Former Smoker -- 0.33 packs/day for 31 years    Types: Cigarettes    Quit date: 06/15/2000  . Smokeless tobacco: Never Used  . Alcohol Use: No     Comment: 05/23/2012 "drank from age [age]; never had problem w/it"    OB History   Grav Para Term Preterm Abortions TAB SAB Ect Mult Living                  Review of Systems  Respiratory: Positive for shortness of breath.     Allergies  Codeine; Morphine and related; Ace inhibitors; and Tylenol  Home Medications   Current Outpatient Rx  Name  Route  Sig  Dispense  Refill  . aspirin EC 81 MG EC tablet   Oral   Take 1 tablet (81 mg total) by mouth daily.   30 tablet   0   . HYDROmorphone (DILAUDID) 2 MG tablet   Oral   Take 1 tablet (2 mg total) by mouth every 6 (six) hours as needed for pain. For pain   30 tablet   0   . methadone (DOLOPHINE) 10 MG tablet   Oral   Take 1 tablet (10 mg total) by mouth 4 (four) times daily.   120 tablet   0   . multivitamin (RENA-VIT) TABS tablet   Oral   Take 1 tablet by mouth daily.         . Nutritional Supplements (FEEDING SUPPLEMENT, NEPRO CARB STEADY,) LIQD   Oral   Take 237 mLs by mouth 2 (two) times daily with breakfast and lunch.         . pantoprazole (PROTONIX) 40 MG  tablet   Oral   Take 40 mg by mouth daily.         . pravastatin (PRAVACHOL) 20 MG tablet   Oral   Take 20 mg by mouth at bedtime.          . ranolazine (  RANEXA) 500 MG 12 hr tablet   Oral   Take 500 mg by mouth daily.          . sevelamer (RENAGEL) 400 MG tablet   Oral   Take 400 mg by mouth daily. With a meal           BP 130/92  Pulse 99  Temp(Src) 97.5 F (36.4 C)  Resp 24  SpO2 100%  Physical Exam  Constitutional: She is oriented to person, place, and time.  Pt is severely cachectic  HENT:  Mouth/Throat: Oropharynx is clear and moist.  Eyes: EOM are normal. Pupils are equal, round, and reactive to light.  Cardiovascular: Exam reveals gallop.   tachycardic  Pulmonary/Chest:  Bilateral crackles throughout lung fields  Abdominal: Soft. She exhibits no distension and no mass. There is tenderness. There is no rebound and no guarding.  Neurological: She is alert and oriented to person, place, and time.  Skin: Skin is warm.  Pt has ICD that is non-tender and non-erythematous, Pt has 2 lumen dialysis cath right chest that is non-tender and non-erythmeatous, bandage over sternum from sensitivity over sternal clips from remote CABG procedure. 2+ pitting edema to knees bilaterally    ED Course  Procedures (including critical care time)  Labs Reviewed  CBC WITH DIFFERENTIAL - Abnormal; Notable for the following:    WBC 18.9 (*)    RBC 2.71 (*)    Hemoglobin 7.8 (*)    HCT 24.7 (*)    RDW 18.8 (*)    Neutrophils Relative 86 (*)    Neutro Abs 16.2 (*)    Lymphocytes Relative 7 (*)    Monocytes Absolute 1.4 (*)    All other components within normal limits  COMPREHENSIVE METABOLIC PANEL - Abnormal; Notable for the following:    Sodium 125 (*)    Chloride 88 (*)    BUN 79 (*)    Creatinine, Ser 4.77 (*)    Albumin 1.7 (*)    Alkaline Phosphatase 172 (*)    GFR calc non Af Amer 9 (*)    GFR calc Af Amer 10 (*)    All other components within normal limits   LIPASE, BLOOD  URINALYSIS, ROUTINE W REFLEX MICROSCOPIC   Dg Chest 2 View  08/13/2012  *RADIOLOGY REPORT*  Clinical Data: 62 year old female shortness of breath and chest pain.  CHEST - 2 VIEW  Comparison: 06/25/2012.  Findings: Stable right IJ approach dual lumen dialysis type catheter.  Stable left chest cardiac AICD. Stable cardiomegaly and mediastinal contours.  Sequelae of CABG.  Lower lung volumes.  New veiling opacity at the right lung base.  Fluid tracking into the right major fissure on the lateral view.  No pneumothorax.  No overt pulmonary edema.  No confluent pulmonary opacity. Osteopenia.  IMPRESSION: 1.  Lower lung volumes and new right pleural effusion, tracking into the right major fissure. 2.  Otherwise stable chest with severe cardiomegaly.   Original Report Authenticated By: Erskine Speed, M.D.      No diagnosis found.    MDM  Fluid overload: pt missed dialysis given SOB. PE findings suggestive of fluid overload as well with crackles and pitting edema. Pt slightly hypoxic into 85% when removed off of 2L O2 that pt doesn't require at baseline. Not HTN or hyperkalemia. Pt does have leukocytosis and had diaphoresis in ED no CP but remained afebrile while in ED.   Nephrology was called spoke to Dr. Arlean Hopping will see patient on admission to  evaluate.   Pt seen and discussed with Dr. Rubin Payor.   Christen Bame, MD 08/13/12 1610  Christen Bame, MD 08/13/12 [phone]

## 2012-08-14 ENCOUNTER — Other Ambulatory Visit (HOSPITAL_COMMUNITY): Payer: Non-veteran care

## 2012-08-14 ENCOUNTER — Inpatient Hospital Stay (HOSPITAL_COMMUNITY): Payer: Medicare Other

## 2012-08-14 DIAGNOSIS — J189 Pneumonia, unspecified organism: Secondary | ICD-10-CM | POA: Diagnosis present

## 2012-08-14 DIAGNOSIS — E871 Hypo-osmolality and hyponatremia: Secondary | ICD-10-CM | POA: Diagnosis present

## 2012-08-14 DIAGNOSIS — D72829 Elevated white blood cell count, unspecified: Secondary | ICD-10-CM | POA: Diagnosis present

## 2012-08-14 DIAGNOSIS — E8770 Fluid overload, unspecified: Secondary | ICD-10-CM | POA: Diagnosis present

## 2012-08-14 DIAGNOSIS — R0602 Shortness of breath: Secondary | ICD-10-CM | POA: Diagnosis present

## 2012-08-14 LAB — URINE MICROSCOPIC-ADD ON

## 2012-08-14 LAB — CBC
HCT: 22.3 % — ABNORMAL LOW (ref 36.0–46.0)
MCHC: 32.3 g/dL (ref 30.0–36.0)
Platelets: 224 10*3/uL (ref 150–400)
RDW: 18.8 % — ABNORMAL HIGH (ref 11.5–15.5)
WBC: 13.7 10*3/uL — ABNORMAL HIGH (ref 4.0–10.5)

## 2012-08-14 LAB — BASIC METABOLIC PANEL
Calcium: 9.9 mg/dL (ref 8.4–10.5)
Chloride: 94 mEq/L — ABNORMAL LOW (ref 96–112)
Creatinine, Ser: 4.94 mg/dL — ABNORMAL HIGH (ref 0.50–1.10)
GFR calc Af Amer: 10 mL/min — ABNORMAL LOW (ref >90)
GFR calc non Af Amer: 9 mL/min — ABNORMAL LOW (ref >90)

## 2012-08-14 LAB — URINALYSIS, ROUTINE W REFLEX MICROSCOPIC
Glucose, UA: NEGATIVE mg/dL
Ketones, ur: NEGATIVE mg/dL
Nitrite: POSITIVE — AB
Protein, ur: 100 mg/dL — AB
pH: 7.5 (ref 5.0–8.0)

## 2012-08-14 LAB — HEPATITIS B SURFACE ANTIGEN: Hepatitis B Surface Ag: NEGATIVE

## 2012-08-14 LAB — PREPARE RBC (CROSSMATCH)

## 2012-08-14 MED ORDER — PIPERACILLIN-TAZOBACTAM IN DEX 2-0.25 GM/50ML IV SOLN
2.2500 g | Freq: Once | INTRAVENOUS | Status: AC
  Administered 2012-08-14: 2.25 g via INTRAVENOUS
  Filled 2012-08-14: qty 50

## 2012-08-14 MED ORDER — ONDANSETRON HCL 4 MG/2ML IJ SOLN
4.0000 mg | Freq: Four times a day (QID) | INTRAMUSCULAR | Status: DC | PRN
  Administered 2012-08-20 – 2012-09-03 (×5): 4 mg via INTRAVENOUS
  Filled 2012-08-14 (×5): qty 2

## 2012-08-14 MED ORDER — PIPERACILLIN-TAZOBACTAM IN DEX 2-0.25 GM/50ML IV SOLN
2.2500 g | Freq: Three times a day (TID) | INTRAVENOUS | Status: DC
  Administered 2012-08-14 – 2012-08-18 (×10): 2.25 g via INTRAVENOUS
  Filled 2012-08-14 (×13): qty 50

## 2012-08-14 MED ORDER — PIPERACILLIN-TAZOBACTAM IN DEX 2-0.25 GM/50ML IV SOLN
2.2500 g | Freq: Three times a day (TID) | INTRAVENOUS | Status: DC
  Filled 2012-08-14 (×4): qty 50

## 2012-08-14 MED ORDER — SODIUM CHLORIDE 0.9 % IJ SOLN: 3.0000 mL | Freq: Two times a day (BID) | INTRAMUSCULAR | Status: DC

## 2012-08-14 MED ORDER — HEPARIN SODIUM (PORCINE) 1000 UNIT/ML DIALYSIS
1600.0000 [IU] | Freq: Once | INTRAMUSCULAR | Status: DC
  Filled 2012-08-14: qty 2

## 2012-08-14 MED ORDER — VANCOMYCIN HCL 10 G IV SOLR
1250.0000 mg | Freq: Once | INTRAVENOUS | Status: AC
  Administered 2012-08-14: 1250 mg via INTRAVENOUS
  Filled 2012-08-14: qty 1250

## 2012-08-14 MED ORDER — PANTOPRAZOLE SODIUM 40 MG PO TBEC
40.0000 mg | DELAYED_RELEASE_TABLET | Freq: Every day | ORAL | Status: DC
  Administered 2012-08-14 – 2012-08-29 (×16): 40 mg via ORAL
  Filled 2012-08-14 (×16): qty 1

## 2012-08-14 MED ORDER — SEVELAMER HCL 400 MG PO TABS
400.0000 mg | ORAL_TABLET | Freq: Every day | ORAL | Status: DC
  Administered 2012-08-15 – 2012-08-22 (×4): 400 mg via ORAL
  Filled 2012-08-14: qty 1
  Filled 2012-08-14: qty 0.5
  Filled 2012-08-14 (×9): qty 1

## 2012-08-14 MED ORDER — RANOLAZINE ER 500 MG PO TB12
500.0000 mg | ORAL_TABLET | Freq: Every day | ORAL | Status: DC
  Administered 2012-08-14: 500 mg via ORAL
  Filled 2012-08-14 (×2): qty 1

## 2012-08-14 MED ORDER — IOHEXOL 300 MG/ML  SOLN
80.0000 mL | Freq: Once | INTRAMUSCULAR | Status: AC | PRN
  Administered 2012-08-14: 80 mL via INTRAVENOUS

## 2012-08-14 MED ORDER — METHADONE HCL 5 MG PO TABS
10.0000 mg | ORAL_TABLET | Freq: Four times a day (QID) | ORAL | Status: DC
  Administered 2012-08-14 – 2012-08-31 (×57): 10 mg via ORAL
  Filled 2012-08-14 (×13): qty 2
  Filled 2012-08-14: qty 1
  Filled 2012-08-14 (×42): qty 2
  Filled 2012-08-14: qty 1
  Filled 2012-08-14: qty 2

## 2012-08-14 MED ORDER — SODIUM CHLORIDE 0.9 % IV SOLN: 100.0000 mL | INTRAVENOUS | Status: DC | PRN

## 2012-08-14 MED ORDER — LIDOCAINE HCL (PF) 1 % IJ SOLN: 5.0000 mL | INTRAMUSCULAR | Status: DC | PRN

## 2012-08-14 MED ORDER — SODIUM CHLORIDE 0.9 % IV SOLN: 250.0000 mL | INTRAVENOUS | Status: DC | PRN

## 2012-08-14 MED ORDER — GUAIFENESIN 100 MG/5ML PO SYRP
200.0000 mg | ORAL_SOLUTION | Freq: Four times a day (QID) | ORAL | Status: DC | PRN
  Administered 2012-08-15 (×3): 200 mg via ORAL
  Filled 2012-08-14 (×3): qty 118

## 2012-08-14 MED ORDER — SODIUM CHLORIDE 0.9 % IJ SOLN: 3.0000 mL | INTRAMUSCULAR | Status: DC | PRN

## 2012-08-14 MED ORDER — ONDANSETRON HCL 4 MG PO TABS
4.0000 mg | ORAL_TABLET | Freq: Four times a day (QID) | ORAL | Status: DC | PRN
  Administered 2012-08-15: 4 mg via ORAL
  Filled 2012-08-14: qty 1

## 2012-08-14 MED ORDER — RENA-VITE PO TABS
1.0000 | ORAL_TABLET | Freq: Every day | ORAL | Status: DC
  Administered 2012-08-14 – 2012-08-16 (×3): 1 via ORAL
  Filled 2012-08-14 (×4): qty 1

## 2012-08-14 MED ORDER — SODIUM CHLORIDE 0.9 % IV SOLN
500.0000 mg | INTRAVENOUS | Status: DC
  Administered 2012-08-16: 500 mg via INTRAVENOUS
  Filled 2012-08-14 (×3): qty 500

## 2012-08-14 MED ORDER — ENOXAPARIN SODIUM 30 MG/0.3ML ~~LOC~~ SOLN
30.0000 mg | SUBCUTANEOUS | Status: DC
  Administered 2012-08-15 – 2012-08-19 (×5): 30 mg via SUBCUTANEOUS
  Filled 2012-08-14 (×8): qty 0.3

## 2012-08-14 MED ORDER — ASPIRIN EC 81 MG PO TBEC
81.0000 mg | DELAYED_RELEASE_TABLET | Freq: Every day | ORAL | Status: DC
  Administered 2012-08-14 – 2012-09-03 (×21): 81 mg via ORAL
  Filled 2012-08-14 (×21): qty 1

## 2012-08-14 MED ORDER — HYDROMORPHONE HCL 2 MG PO TABS
2.0000 mg | ORAL_TABLET | Freq: Four times a day (QID) | ORAL | Status: DC | PRN
  Administered 2012-08-14 – 2012-08-28 (×21): 2 mg via ORAL
  Filled 2012-08-14 (×19): qty 1

## 2012-08-14 MED ORDER — PENTAFLUOROPROP-TETRAFLUOROETH EX AERO: 1.0000 "application " | INHALATION_SPRAY | CUTANEOUS | Status: DC | PRN

## 2012-08-14 MED ORDER — NEPRO/CARBSTEADY PO LIQD: 237.0000 mL | ORAL | Status: DC | PRN

## 2012-08-14 MED ORDER — NEPRO/CARBSTEADY PO LIQD
237.0000 mL | Freq: Two times a day (BID) | ORAL | Status: DC
  Administered 2012-08-14 – 2012-08-28 (×19): 237 mL via ORAL

## 2012-08-14 MED ORDER — HEPARIN SODIUM (PORCINE) 1000 UNIT/ML DIALYSIS
1000.0000 [IU] | INTRAMUSCULAR | Status: DC | PRN
  Filled 2012-08-14: qty 1

## 2012-08-14 MED ORDER — SODIUM CHLORIDE 0.9 % IJ SOLN
3.0000 mL | Freq: Two times a day (BID) | INTRAMUSCULAR | Status: DC
  Administered 2012-08-14: 3 mL via INTRAVENOUS

## 2012-08-14 MED ORDER — ALTEPLASE 2 MG IJ SOLR: 2.0000 mg | Freq: Once | INTRAMUSCULAR | Status: DC | PRN

## 2012-08-14 MED ORDER — SIMVASTATIN 10 MG PO TABS
10.0000 mg | ORAL_TABLET | Freq: Every day | ORAL | Status: DC
  Administered 2012-08-14 – 2012-09-03 (×19): 10 mg via ORAL
  Filled 2012-08-14 (×21): qty 1

## 2012-08-14 MED ORDER — LIDOCAINE-PRILOCAINE 2.5-2.5 % EX CREA: 1.0000 "application " | TOPICAL_CREAM | CUTANEOUS | Status: DC | PRN

## 2012-08-14 NOTE — Progress Notes (Signed)
Per Dr.Robert Rolan Bucco, MD, [facility] was accessed for blood transfusion.  5cc of blood pulled off of blue port, flushed with 10cc NS, NS with filter connected to [facility] Nurse to began blood transfusion. [name]

## 2012-08-14 NOTE — Progress Notes (Addendum)
Addendum to admission note done today 08/14/2012.  [facility] PROGRESS NOTE  WESTLYNN FIFER DGU:[id number] DOB: 09/12/1950 DOA: 08/13/2012 PCP: Jyl Heinz, MD  Brief narrative: 62 year old female from NH and with past medical history including but not limited to ESRD on HD (TTS), ischemic cardiomyopathy and EF of 10% status post ICD, chronic pancreatitis and recurrent pancreatic abscess who presented 08/13/2012 with complaints of worsening shortness of breath with associated cough and pleuritic chest pain. In NH, oxygen saturation was  85%. She remained short of breath even with supplemental oxygen. Patient additionally reported left sided abdominal pain but no associated nausea or vomiting. In ED, evaluation included CXR which showed new right pleural effusion and right lung base opacity. ALP was elevated at 172, WBC count was 18.9 and lipase 24.  Assessment/Plan:  Principal Problem:   *SOB (shortness of breath)  Likely related to HCAP  Will continue vanco and zosyn  Follow up blood cultures results  Oxygen support via nasal canula to keep O2 saturation above 90%  nebulizers as needed Active Problems:   Chronic systolic congestive heart failure, NYHA class 3  Follow up 2 D ECHO results   Pancreatitis, chronic  No abscess noted on CT abd/pelvis on this admission  Lipase WNL on this admission.   Anemia in chronic kidney disease (CKD)  Hemoglobin 7.2  Patient to receive transfusion   ESRD on hemodialysis  Per renal   Leukocytosis  Likely due to HCAP  Antibiotics as above   Hyponatremia  Secondary to fluid overload, CHF, ESRD  Improving    HCAP (healthcare-associated pneumonia)  Continue vanco and zosyn   Code Status: full code Family Communication: no family at bedside Disposition Plan: home when stable  Manson Passey, MD  [facility] Pager [phone]  If 7PM-7AM, please contact night-coverage [email or web address] Password [email or web address] 08/14/2012, 9:29 PM   LOS: 1 day    Consultants:  Nephrology  Procedures:  None   Antibiotics:  Vancomycin 08/13/2012 -->  Zosyn 08/13/2012 -->  HPI/Subjective: No acute events since admission.  Objective: Filed Vitals:   08/14/12 1734 08/14/12 1805 08/14/12 1839 08/14/12 2037  BP: 108/70 117/75 112/77 124/78  Pulse: 80 84 87 82  Temp: 98.1 F (36.7 C) 97.7 F (36.5 C) 97.8 F (36.6 C) 98.6 F (37 C)  TempSrc: Oral Oral Oral Oral  Resp: 20 18 22 18   Height:      Weight:    51.71 kg (114 lb)  SpO2:    98%    Intake/Output Summary (Last 24 hours) at 08/14/12 2129 Last data filed at 08/14/12 1923  Gross per 24 hour  Intake  252.5 ml  Output   3502 ml  Net -3249.5 ml    Exam:   General:  Pt is alert, follows commands appropriately, not in acute distress  Cardiovascular: Regular rate and rhythm, S1/S2, no murmurs, no rubs, no gallops  Respiratory: Clear to auscultation bilaterally, no wheezing, no crackles, no rhonchi  Abdomen: tender across mid abdomen, ascites, bowel sounds present, no guarding  Extremities: LE (+2) pitting edema, pulses DP and PT palpable bilaterally  Neuro: Grossly nonfocal  Data Reviewed: Basic Metabolic Panel:  Recent Labs Lab 08/13/12 2142 08/14/12 0500  NA 125* 132*  K 4.6 4.6  CL 88* 94*  CO2 23 27  GLUCOSE 83 65*  BUN 79* 81*  CREATININE 4.77* 4.94*  CALCIUM 10.1 9.9   Liver Function Tests:  Recent Labs Lab 08/13/12 2142  AST 20  ALT 11  ALKPHOS  172*  BILITOT 0.3  PROT 7.0  ALBUMIN 1.7*    Recent Labs Lab 08/13/12 2142  LIPASE 24   CBC:  Recent Labs Lab 08/13/12 2142 08/14/12 0500  WBC 18.9* 13.7*  NEUTROABS 16.2*  --   HGB 7.8* 7.2*  HCT 24.7* 22.3*  MCV 91.1 92.1  PLT 286 224   Cardiac Enzymes: No results found for this basename: CKTOTAL, CKMB, CKMBINDEX, TROPONINI,  in the last 168 hours BNP: No components found with this basename: POCBNP,  CBG: No results found for this basename: GLUCAP,  in the last 168 hours  MRSA  PCR SCREENING     Status: None   Collection Time    08/14/12  1:24 AM      Result Value Range Status   MRSA by PCR NEGATIVE  NEGATIVE Final   Comment:            Studies: Dg Chest 1 View 08/14/2012  * IMPRESSION: 1.  Grossly unchanged small right-sided effusion and right basilar opacities, atelectasis versus infiltrate. 2.  Suspected worsening of asymmetric right-sided pulmonary edema.   Original Report Authenticated By: Tacey Ruiz, MD    Dg Chest 2 View 08/13/2012  IMPRESSION: 1.  Lower lung volumes and new right pleural effusion, tracking into the right major fissure. 2.  Otherwise stable chest with severe cardiomegaly.   Original Report Authenticated By: Erskine Speed, M.D.    Ct Abdomen Pelvis W Contrast 08/14/2012  IMPRESSION: Pancreatic atrophy without a well-defined, drainable pseudocyst/abscess.  Scattered foci of gas along the pancreatic tail/spleen in the left upper abdomen.  2.0 x 1.0 cm gas and fluid collection adjacent the left lateral abdominal wall.  These findings are grossly unchanged.  Large volume abdominopelvic ascites, including peritoneal thickening / enhancement in the right paracolic gutter, grossly unchanged.   Original Report Authenticated By: Charline Bills, M.D.     Scheduled Meds: . aspirin EC  81 mg Oral Daily  . enoxaparin (LOVENOX)   30 mg Subcutaneous Q24H  . methadone  10 mg Oral QID  . multivitamin  1 tablet Oral Daily  . pantoprazole  40 mg Oral Daily  . piperacillin-tazobactam   2.25 g Intravenous Q8H  . ranolazine  500 mg Oral Daily  . sevelamer  400 mg Oral Q breakfast  . simvastatin  10 mg Oral q1800  .  vancomycin  500 mg Intravenous Q T,Th,Sa-HD

## 2012-08-14 NOTE — H&P (Addendum)
[facility] History and Physical  [name] WUX:[id number] DOB: 07/18/1950 DOA: 08/13/2012  Referring physician: Dr. Rubin Payor PCP: Jyl Heinz, MD  Specialists:   Chief Complaint: Worsening shortness of breath  HPI: [name] is a 62 y.o. 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 She states that she's had worsening shortness of breath for the past 1 to 2 days and missed her dialysis on saturday because of the shortness of breath. She reports that her O2 sat was checked in the facility and was 85 and so she was put on supplemental oxygen. She admits to a nonproductive cough and a R. pleuritic pain which she has had for some time. She also admits to night sweats, but denies fevers and no chills. Patient also states that her left sided pain has been worse for the past few days. She clarifies that she does not want an MRI or CT scan with contrast but is okay with getting a CT without contrast. She denies any nausea or vomiting, no diarrhea. She was seen in the ED and a chest x-ray showed a new right pleural effusion, a new veiling opacity in the right lung base noted. Her alkaline phosphatase was elevated at 172, wbc count 18.9, lipase of 24. Nephrology was consulted per EDP. She is admitted to the hospitalist service for further evaluation and management.  Review of Systems: The patient denies anorexia, fever, weight loss,, vision loss, decreased hearing, hoarseness, syncope, balance deficits, hemoptysis, melena, hematochezia, severe indigestion/heartburn, hematuria, incontinence, genital sores,  muscle weakness,transient blindness, difficulty walking, depression, unusual weight change.   Past Medical History  Diagnosis Date  . Coronary artery disease     s/p CABG 2008 with multiple PCIs  . Ischemic cardiomyopathy     Cath 04/2012, significant calcifications  . History of nonadherence to medical treatment   . Systolic CHF     25/3664 echo EF 10%  . Esophageal varices   . Cirrhosis     hx of fatty liver per patient, has known ascites with repeated paracenteses at [facility] as of 2013, also +hx of esoph varices with banding in the past  . Pacemaker   . ICD (implantable cardiac defibrillator) in place   . Hypertension     "used to have HTN; now I'm low" (05/23/2012)  . NSTEMI (non-ST elevated myocardial infarction)     04/2012; Trop peaked to 1.39; 05/23/2012 pt denies ever having MI  . Pneumonia 02/2012    "first time ever" (05/23/2012)  . Chronic bronchitis 1970's thru 2002    "went away when I stopped smoking in 2002" (05/23/2012)  . Diabetes mellitus     Diet controlled  . History of blood transfusion     "alot of them" (05/23/2012)  . Iron (Fe) deficiency anemia     "severe" (05/23/2012)  . Pancreatitis, chronic 10/04/2011    Had severe gallstone pancreatitis in May 2003, no surgery, treated medically then returned in June 2003 for cholecystectomy and cyst gastrostomy for pancreatic pseudocyst. In March 2013 was admitted for hemorrhage into psueodcyst. In April 2013 was treated at [facility] for polymicrobial bacteremia (fungal, MRSA and enterobacter) felt to be due to ruptured pseudocyst, treated medically. Admitted Jan 2014 with  abd pain and had gas and fluid in pancreatic bed. She refused surgery ("would not make it") and had fluid aspirated by IR- gram stain showed yeast and cultures grew enterococcus species, sensitive to amp and vanc. She was seen by ID and discharged on IV vanc/fortaz with HD and po voriconazole.     [name] ESRD on hemodialysis 05/06/2012    T,Th,Sat HD by [facility] on  [address]. Started hemodialysis around July 2013.    [name] CHF (congestive heart failure)   . Complication of vascular access for dialysis 07/13/2012    L arm AVG (placed by Dr. Lyda Perone @ [facility]) was ligated 1/6 after significant L arm swelling secondary to L SCV stenosis and pacemaker; new access to be placed at right arm on 1/29 by Dr. Hollace Hayward     Past Surgical History  Procedure Laterality Date  . Esophagogastroduodenoscopy  09/15/2011    Procedure: ESOPHAGOGASTRODUODENOSCOPY (EGD);  Surgeon: Theda Belfast, MD;  Location: [facility] ENDOSCOPY;  Service: Endoscopy;  Laterality: N/A;  . Cholecystectomy  2003  . Tonsillectomy and adenoidectomy  1961  . Appendectomy  1973?  [name] Vaginal hysterectomy  1973?  [name] Tubal ligation  1972  . Dilation and curettage of uterus      "bunch from profuse bleeding in the 1970's" (05/23/2012)  . Coronary artery bypass graft  2008    CABG X4  . Coronary angioplasty with stent placement  2008    "1; day after CABG" (05/23/2012)  . Cardiac catheterization      "before 2008 and 3 wk ago" (05/23/2012)  . Insert / replace / remove pacemaker  06/2011    pacemaker ICD  . Cardiac defibrillator placement  06/2011  . Arteriovenous graft placement  03/2012    right antecub  . Insertion of dialysis catheter  ~ 10/2011    right chest  . Peritoneal catheter insertion  08/2011  . Peritoneal catheter removal  ~ 10/2011   Social History:  reports that she quit smoking about 12 years ago. Her smoking use included Cigarettes. She has a 10.23 pack-year smoking history. She has never used smokeless tobacco. She reports that she does not drink alcohol or use illicit drugs.  where does patient live--SNF  Can patient participate in ADLs?  Allergies  Allergen Reactions  . Codeine Itching  . Morphine And Related Shortness Of Breath    SOB when given IV once "to me it was mild; I called out fast for help; never had to intubate" (05/23/2012)  . Ace Inhibitors Other (See Comments)     Unknown reaction but her physician stated that she can't take it (specifically lisinopril)  . Tylenol (Acetaminophen) Other (See Comments)    Patient states that the doctor told her she has a Fatty Liver.    Family History  Problem Relation Age of Onset  . Coronary artery disease Mother   . Hypertension Mother   . Diabetes Mother   . Diabetes Sister   . Anesthesia problems Neg Hx    Prior to Admission medications   Medication Sig Start Date End Date Taking? Authorizing Provider  aspirin EC 81 MG EC tablet Take 1 tablet (81 mg total) by mouth daily. 05/11/12  Yes Larey Seat, MD  HYDROmorphone (DILAUDID) 2 MG tablet Take 1 tablet (2 mg total) by mouth every 6 (six) hours as needed for pain. For pain 07/20/12  Yes Rhetta Mura, MD  methadone (DOLOPHINE) 10 MG tablet Take 1 tablet (10 mg total) by mouth  4 (four) times daily. 07/20/12  Yes Rhetta Mura, MD  multivitamin (RENA-VIT) TABS tablet Take 1 tablet by mouth daily. 10/14/11  Yes Clanford Cyndie Mull, MD  Nutritional Supplements (FEEDING SUPPLEMENT, NEPRO CARB STEADY,) LIQD Take 237 mLs by mouth 2 (two) times daily with breakfast and lunch. 03/04/12  Yes Srikar Cherlynn Kaiser, MD  pantoprazole (PROTONIX) 40 MG tablet Take 40 mg by mouth daily.   Yes Historical Provider, MD  pravastatin (PRAVACHOL) 20 MG tablet Take 20 mg by mouth at bedtime.    Yes Historical Provider, MD  ranolazine (RANEXA) 500 MG 12 hr tablet Take 500 mg by mouth daily.    Yes Historical Provider, MD  sevelamer (RENAGEL) 400 MG tablet Take 400 mg by mouth daily. With a meal   Yes Historical Provider, MD   Physical Exam: Filed Vitals:   08/13/12 2018 08/13/12 2019 08/13/12 2147  BP:  119/85 130/92  Pulse:  101 99  Temp:  97.5 F (36.4 C)   Resp:  16 24  SpO2: 100% 100% 100%    Constitutional: Vital signs reviewed.  Patient is a well-developed in no acute distress with nasal cannula oxygen on, and cooperative with exam. Alert and oriented x3.  Head:  Normocephalic and atraumatic Mouth: no erythema or exudates, MMM Eyes: PERRL, EOMI, conjunctivae normal, No scleral icterus.  Neck: Supple, Trachea midline normal ROM, No JVD, mass, thyromegaly, or carotid bruit present.  Cardiovascular: RRR, S1 normal, S2 normal, no MRG, pulses symmetric and intact bilaterally. Pulmonary/Chest: Scattered crackles bilaterally, no wheezes. Left upper chest wall with pacemaker. Abdominal: Soft. Left mid an upper abdominal tenderness, non-distended, bowel sounds are normal, no masses, organomegaly, or guarding present.  GU: no CVA tenderness Extremities: +3 edema  Neurological: A&O x3, Strength is normal and symmetric bilaterally, cranial nerve II-XII are grossly intact, no focal motor deficit, sensory intact to light touch bilaterally.  Skin: Warm, dry and intact. No rash, cyanosis, or clubbing.  Psychiatric: Normal mood and affect. speech and behavior is normal. Judgment and thought content normal. Cognition and memory are normal.    Labs on Admission:  Basic Metabolic Panel:  Recent Labs Lab 08/13/12 2142  NA 125*  K 4.6  CL 88*  CO2 23  GLUCOSE 83  BUN 79*  CREATININE 4.77*  CALCIUM 10.1   Liver Function Tests:  Recent Labs Lab 08/13/12 2142  AST 20  ALT 11  ALKPHOS 172*  BILITOT 0.3  PROT 7.0  ALBUMIN 1.7*    Recent Labs Lab 08/13/12 2142  LIPASE 24   No results found for this basename: AMMONIA,  in the last 168 hours CBC:  Recent Labs Lab 08/13/12 2142  WBC 18.9*  NEUTROABS 16.2*  HGB 7.8*  HCT 24.7*  MCV 91.1  PLT 286   Cardiac Enzymes: No results found for this basename: CKTOTAL, CKMB, CKMBINDEX, TROPONINI,  in the last 168 hours  BNP (last 3 results)  Recent Labs  04/21/12 1419 05/08/12 0515 06/02/12 1944  PROBNP >70000.0* >70000.0* >70000.0*   CBG: No results found for this basename: GLUCAP,  in the last 168 hours  Radiological Exams on Admission: Dg Chest 2 View  08/13/2012  *RADIOLOGY REPORT*   Clinical Data: 62 year old female shortness of breath and chest pain.  CHEST - 2 VIEW  Comparison: 06/25/2012.  Findings: Stable right IJ approach dual lumen dialysis type catheter.  Stable left chest cardiac AICD. Stable cardiomegaly and mediastinal contours.  Sequelae of CABG.  Lower lung volumes.  New veiling opacity at the  right lung base.  Fluid tracking into the right major fissure on the lateral view.  No pneumothorax.  No overt pulmonary edema.  No confluent pulmonary opacity. Osteopenia.  IMPRESSION: 1.  Lower lung volumes and new right pleural effusion, tracking into the right major fissure. 2.  Otherwise stable chest with severe cardiomegaly.   Original Report Authenticated By: Erskine Speed, M.D.     Assessment/Plan Principal Problem:   SOB (shortness of breath)-volume overload versus HCAP -Start empiric antibiotics with Vanc and Zosyn -Follow and recheck chest x-ray in a.m. after dialysis and consider DC of empiric antibiotics if appropriate -Renal consulted per EDP and plan dialysis in a.m. Active Problems:   Volume overload -Likely secondary to missed dialysis in this patient with end-stage renal disease and chronic systolic CHF -As discussed above, dialysis per renal   Leukocytosis - possible pneumonia as above vs recurrent pancreatic abscess  -obtain CT of abdomen and pelvis without contrast to further evaluate for possible pancreatic abscess -Follow recheck chest x-ray in a.m. as above -empiric antibiotics as discussed above -Recheck WBC in a.m, obtain blood cultures if febrile   Ischemic cardiomyopathy, EF 20-25% echo April 2013, now 10% by cath (05/06/12)/Chronic systolic congestive heart failure, NYHA class 3  -Obtain EKG,   Hypertension -Continue outpatient medications   Pancreatitis, chronic -Continue pain management ESRD on hemodialysis -Renal consulted for dialysis as above   CAD, CABG X 4 Feb '08, LAD stent June '08 -EKG, continue outpatient medications    Hyponatremia -Likely secondary to hypervolemia, renal for dialysis as above -Follow and recheck in am  h/o Esophageal varices in cirrhosis, admitted June 2013 to [facility]  Code Status:DNR Family Communication: with pt at bedside  Disposition Plan:admit to tele  Time spent: >27mins  Kela Millin [facility] Pager [phone]  If 7PM-7AM, please contact night-coverage [email or web address] Password [facility] 08/14/2012, 12:37 AM

## 2012-08-14 NOTE — Progress Notes (Signed)
ANTIBIOTIC CONSULT NOTE - INITIAL  Pharmacy Consult for Vancocin and Zosyn Indication: PNA vs abscess/pseudocyst  Allergies  Allergen Reactions  . Codeine Itching  . Morphine And Related Shortness Of Breath    SOB when given IV once "to me it was mild; I called out fast for help; never had to intubate" (05/23/2012)  . Ace Inhibitors Other (See Comments)    Unknown reaction but her physician stated that she can't take it (specifically lisinopril)  . Tylenol (Acetaminophen) Other (See Comments)    Patient states that the doctor told her she has a Fatty Liver.    Patient Measurements: Height: 5\' 4"  (162.6 cm) Weight: 120 lb 5.9 oz (54.6 kg) IBW/kg (Calculated) : 54.7  Vital Signs: Temp: 97.9 F (36.6 C) (03/02 0119) Temp src: Oral (03/02 0119) BP: 115/74 mmHg (03/02 0119) Pulse Rate: 71 (03/02 0119)  Labs:  Recent Labs  08/13/12 2142  WBC 18.9*  HGB 7.8*  PLT 286  CREATININE 4.77*   Estimated Creatinine Clearance: 10.4 ml/min (by C-G formula based on Cr of 4.77).   Microbiology: No results found for this or any previous visit (from the past 720 hour(s)).  Medical History: Past Medical History  Diagnosis Date  . Coronary artery disease     s/p CABG 2008 with multiple PCIs  . Ischemic cardiomyopathy     Cath 04/2012, significant calcifications  . History of nonadherence to medical treatment   . Systolic CHF     16/1096 echo EF 10%  . Esophageal varices   . Cirrhosis     hx of fatty liver per patient, has known ascites with repeated paracenteses at [facility] as of 2013, also +hx of esoph varices with banding in the past  . Pacemaker   . ICD (implantable cardiac defibrillator) in place   . Hypertension     "used to have HTN; now I'm low" (05/23/2012)  . NSTEMI (non-ST elevated myocardial infarction)     04/2012; Trop peaked to 1.39; 05/23/2012 pt denies ever having MI  . Pneumonia 02/2012    "first time ever" (05/23/2012)  . Chronic bronchitis 1970's thru 2002   "went away when I stopped smoking in 2002" (05/23/2012)  . Diabetes mellitus     Diet controlled  . History of blood transfusion     "alot of them" (05/23/2012)  . Iron (Fe) deficiency anemia     "severe" (05/23/2012)  . Pancreatitis, chronic 10/04/2011    Had severe gallstone pancreatitis in May 2003, no surgery, treated medically then returned in June 2003 for cholecystectomy and cyst gastrostomy for pancreatic pseudocyst. In March 2013 was admitted for hemorrhage into psueodcyst. In April 2013 was treated at [facility] for polymicrobial bacteremia (fungal, MRSA and enterobacter) felt to be due to ruptured pseudocyst, treated medically. Admitted Jan 2014 with abd pain and had gas and fluid in pancreatic bed. She refused surgery ("would not make it") and had fluid aspirated by IR- gram stain showed yeast and cultures grew enterococcus species, sensitive to amp and vanc. She was seen by ID and discharged on IV vanc/fortaz with HD and po voriconazole.     [name] ESRD on hemodialysis 05/06/2012    T,Th,Sat HD by [facility] on [address]. Started hemodialysis around July 2013.    [name] CHF (congestive heart failure)   . Complication of vascular access for dialysis 07/13/2012    L arm AVG (placed by Dr. Lyda Perone @ [facility]) was ligated 1/6 after significant L arm swelling secondary to L SCV  stenosis and pacemaker; new access to be placed at right arm on 1/29 by Dr. Hollace Hayward      Medications:  Prescriptions prior to admission  Medication Sig Dispense Refill  . aspirin EC 81 MG EC tablet Take 1 tablet (81 mg total) by mouth daily.  30 tablet  0  . HYDROmorphone (DILAUDID) 2 MG tablet Take 1 tablet (2 mg total) by mouth every 6 (six) hours as needed for pain. For pain  30 tablet  0  . methadone (DOLOPHINE) 10 MG tablet Take 1 tablet (10 mg total) by mouth 4 (four) times daily.  120 tablet  0  . multivitamin (RENA-VIT) TABS tablet Take 1 tablet by mouth daily.      . Nutritional Supplements (FEEDING  SUPPLEMENT, NEPRO CARB STEADY,) LIQD Take 237 mLs by mouth 2 (two) times daily with breakfast and lunch.      . pantoprazole (PROTONIX) 40 MG tablet Take 40 mg by mouth daily.      . pravastatin (PRAVACHOL) 20 MG tablet Take 20 mg by mouth at bedtime.       . ranolazine (RANEXA) 500 MG 12 hr tablet Take 500 mg by mouth daily.       . sevelamer (RENAGEL) 400 MG tablet Take 400 mg by mouth daily. With a meal       Scheduled:  . aspirin EC  81 mg Oral Daily  . enoxaparin (LOVENOX) injection  30 mg Subcutaneous Q24H  . feeding supplement (NEPRO CARB STEADY)  237 mL Oral BID WC  . [START ON 08/15/2012] heparin  1,600 Units Dialysis Once in dialysis  . [COMPLETED]  HYDROmorphone (DILAUDID) injection  2 mg Intravenous Once  . methadone  10 mg Oral QID  . multivitamin  1 tablet Oral Daily  . pantoprazole  40 mg Oral Daily  . piperacillin-tazobactam (ZOSYN)  IV  2.25 g Intravenous Once  . piperacillin-tazobactam (ZOSYN)  IV  2.25 g Intravenous Q8H  . ranolazine  500 mg Oral Daily  . sevelamer carbonate  400 mg Oral Daily  . simvastatin  10 mg Oral q1800  . sodium chloride  3 mL Intravenous Q12H  . sodium chloride  3 mL Intravenous Q12H  . vancomycin  1,250 mg Intravenous Once  . [START ON 08/16/2012] vancomycin  500 mg Intravenous Q T,Th,Sa-HD    Assessment: 62yo female NH resident c/o cough and SOB that prevented her from going to HD on Saturday, to begin IV ABX for r/o PNA vs abscess/pseudocyst.  Goal of Therapy:  Pre-HD vanc level 15-25  Plan:  Will give vancomycin 1250mg  IV x1 followed by 500mg  after each HD as well as Zosyn 2.25g IV Q8H and monitor CBC, Cx, levels prn.  Vernard Gambles, PharmD, BCPS  08/14/2012,1:28 AM

## 2012-08-14 NOTE — Consult Note (Signed)
[facility] Renal Consultation Note  Indication for Consultation:  Management of ESRD/hemodialysis; anemia, hypertension/volume and secondary hyperparathyroidism  HPI: [name] is a 62 y.o. female, a resident of [facility] skilled nursing facility, with ESRD on dialysis on TTS at the [facility] who presented to the ED yesterday after worsening shortness of breath, nonproductive cough, and night sweats for two days.  As a result, she missed yesterday's scheduled dialysis and will require a treatment today.  Chest x-ray showed no overt pulmonary edema, but a new right pleural effusion, and her WBC count was initially 18,900.  She is now receiving IV Vancomycin and Zosyn.  She has a history of ischemic cardiomyopathy with EF 10% per catheterization 05/06/12, cirrhosis, and chronic pancreatitis with recurrent infected pseudocyst/pancreatic abscess, positive for enterococcus and yeast per aspiration 06/28/12, status post antibiotic treatment.  She is currently stable on 2.5 liters of oxygen per nasal cannula.  Dialysis Orders: Center: [facility] on TTS. EDW 53.5 kg   HD Bath 3K/2.5Ca  Time 4hrs   Heparin 1600 U. Access Right IJ catheter   BFR 400 DFR 800  Hectorol 0 mcg IV/HD   Epogen 1500 Units IV/HD  Venofer 0.   Past Medical History  Diagnosis Date  . Coronary artery disease     s/p CABG 2008 with multiple PCIs  . Ischemic cardiomyopathy     Cath 04/2012, significant calcifications  . History of nonadherence to medical treatment   . Systolic CHF     16/1096 echo EF 10%  . Esophageal varices   . Cirrhosis     hx of fatty liver per patient, has known ascites with repeated paracenteses at [facility] as of 2013, also +hx of esoph varices with banding in the past  . Pacemaker   . ICD (implantable cardiac defibrillator) in place   . Hypertension     "used to have HTN; now I'm low" (05/23/2012)  . NSTEMI (non-ST elevated myocardial infarction)     04/2012; Trop peaked to 1.39;  05/23/2012 pt denies ever having MI  . Pneumonia 02/2012    "first time ever" (05/23/2012)  . Chronic bronchitis 1970's thru 2002    "went away when I stopped smoking in 2002" (05/23/2012)  . Diabetes mellitus     Diet controlled  . History of blood transfusion     "alot of them" (05/23/2012)  . Iron (Fe) deficiency anemia     "severe" (05/23/2012)  . Pancreatitis, chronic 10/04/2011    Had severe gallstone pancreatitis in May 2003, no surgery, treated medically then returned in June 2003 for cholecystectomy and cyst gastrostomy for pancreatic pseudocyst. In March 2013 was admitted for hemorrhage into psueodcyst. In April 2013 was treated at [facility] for polymicrobial bacteremia (fungal, MRSA and enterobacter) felt to be due to ruptured pseudocyst, treated medically. Admitted Jan 2014 with abd pain and had gas and fluid in pancreatic bed. She refused surgery ("would not make it") and had fluid aspirated by IR- gram stain showed yeast and cultures grew enterococcus species, sensitive to amp and vanc. She was seen by ID and discharged on IV vanc/fortaz with HD and po voriconazole.     [name] ESRD on hemodialysis 05/06/2012    T,Th,Sat HD by [facility] on [address]. Started hemodialysis around July 2013.    [name] CHF (congestive heart failure)   . Complication of vascular access for dialysis 07/13/2012    L arm AVG (placed by Dr. Lyda Perone @ [facility]) was ligated 1/6 after  significant L arm swelling secondary to L SCV stenosis and pacemaker; new access to be placed at right arm on 1/29 by Dr. Hollace Hayward     Past Surgical History  Procedure Laterality Date  . Esophagogastroduodenoscopy  09/15/2011    Procedure: ESOPHAGOGASTRODUODENOSCOPY (EGD);  Surgeon: Theda Belfast, MD;  Location: [facility] ENDOSCOPY;  Service: Endoscopy;  Laterality: N/A;  . Cholecystectomy  2003  . Tonsillectomy and adenoidectomy  1961  . Appendectomy  1973?  [name] Vaginal hysterectomy  1973?  [name] Tubal ligation  1972  . Dilation and  curettage of uterus      "bunch from profuse bleeding in the 1970's" (05/23/2012)  . Coronary artery bypass graft  2008    CABG X4  . Coronary angioplasty with stent placement  2008    "1; day after CABG" (05/23/2012)  . Cardiac catheterization      "before 2008 and 3 wk ago" (05/23/2012)  . Insert / replace / remove pacemaker  06/2011    pacemaker ICD  . Cardiac defibrillator placement  06/2011  . Arteriovenous graft placement  03/2012    right antecub  . Insertion of dialysis catheter  ~ 10/2011    right chest  . Peritoneal catheter insertion  08/2011  . Peritoneal catheter removal  ~ 10/2011   Family History  Problem Relation Age of Onset  . Coronary artery disease Mother   . Hypertension Mother   . Diabetes Mother   . Diabetes Sister   . Anesthesia problems Neg Hx    Social History  She quit smoking cigarettes in 2002 after smoking a pack every three days. She has never used smokeless tobacco. She previously drank occasional alcohol, but denies any history of illicit drugs. She spent ten years in the [employer] and also worked as a Engineer, civil (consulting). She has never been married and currently lives alone.  Allergies  Allergen Reactions  . Codeine Itching  . Morphine And Related Shortness Of Breath    SOB when given IV once "to me it was mild; I called out fast for help; never had to intubate" (05/23/2012)  . Ace Inhibitors Other (See Comments)    Unknown reaction but her physician stated that she can't take it (specifically lisinopril)  . Tylenol (Acetaminophen) Other (See Comments)    Patient states that the doctor told her she has a Fatty Liver.   Prior to Admission medications   Medication Sig Start Date End Date Taking? Authorizing Provider  aspirin EC 81 MG EC tablet Take 1 tablet (81 mg total) by mouth daily. 05/11/12  Yes Larey Seat, MD  HYDROmorphone (DILAUDID) 2 MG tablet Take 1 tablet (2 mg total) by mouth every 6 (six) hours as needed for pain. For pain 07/20/12  Yes Rhetta Mura, MD  methadone (DOLOPHINE) 10 MG tablet Take 1 tablet (10 mg total) by mouth 4 (four) times daily. 07/20/12  Yes Rhetta Mura, MD  multivitamin (RENA-VIT) TABS tablet Take 1 tablet by mouth daily. 10/14/11  Yes Clanford Cyndie Mull, MD  Nutritional Supplements (FEEDING SUPPLEMENT, NEPRO CARB STEADY,) LIQD Take 237 mLs by mouth 2 (two) times daily with breakfast and lunch. 03/04/12  Yes Srikar Cherlynn Kaiser, MD  pantoprazole (PROTONIX) 40 MG tablet Take 40 mg by mouth daily.   Yes Historical Provider, MD  pravastatin (PRAVACHOL) 20 MG tablet Take 20 mg by mouth at bedtime.    Yes Historical Provider, MD  ranolazine (RANEXA) 500 MG 12 hr tablet Take 500 mg by mouth daily.  Yes Historical Provider, MD  sevelamer (RENAGEL) 400 MG tablet Take 400 mg by mouth daily. With a meal   Yes Historical Provider, MD   Labs:  Results for orders placed during the hospital encounter of 08/13/12 (from the past 48 hour(s))  CBC WITH DIFFERENTIAL     Status: Abnormal   Collection Time    08/13/12  9:42 PM      Result Value Range   WBC 18.9 (*) 4.0 - 10.5 K/uL   RBC 2.71 (*) 3.87 - 5.11 MIL/uL   Hemoglobin 7.8 (*) 12.0 - 15.0 g/dL   HCT 16.1 (*) 09.6 - 04.5 %   MCV 91.1  78.0 - 100.0 fL   MCH 28.8  26.0 - 34.0 pg   MCHC 31.6  30.0 - 36.0 g/dL   RDW 40.9 (*) 81.1 - 91.4 %   Platelets 286  150 - 400 K/uL   Neutrophils Relative 86 (*) 43 - 77 %   Neutro Abs 16.2 (*) 1.7 - 7.7 K/uL   Lymphocytes Relative 7 (*) 12 - 46 %   Lymphs Abs 1.3  0.7 - 4.0 K/uL   Monocytes Relative 7  3 - 12 %   Monocytes Absolute 1.4 (*) 0.1 - 1.0 K/uL   Eosinophils Relative 0  0 - 5 %   Eosinophils Absolute 0.1  0.0 - 0.7 K/uL   Basophils Relative 0  0 - 1 %   Basophils Absolute 0.1  0.0 - 0.1 K/uL  COMPREHENSIVE METABOLIC PANEL     Status: Abnormal   Collection Time    08/13/12  9:42 PM      Result Value Range   Sodium 125 (*) 135 - 145 mEq/L   Potassium 4.6  3.5 - 5.1 mEq/L   Chloride 88 (*) 96 - 112 mEq/L   CO2 23  19  - 32 mEq/L   Glucose, Bld 83  70 - 99 mg/dL   BUN 79 (*) 6 - 23 mg/dL   Creatinine, Ser 7.82 (*) 0.50 - 1.10 mg/dL   Calcium 95.6  8.4 - 21.3 mg/dL   Total Protein 7.0  6.0 - 8.3 g/dL   Albumin 1.7 (*) 3.5 - 5.2 g/dL   AST 20  0 - 37 U/L   ALT 11  0 - 35 U/L   Alkaline Phosphatase 172 (*) 39 - 117 U/L   Total Bilirubin 0.3  0.3 - 1.2 mg/dL   GFR calc non Af Amer 9 (*) >90 mL/min   GFR calc Af Amer 10 (*) >90 mL/min   Comment:            The eGFR has been calculated     using the CKD EPI equation.     This calculation has not been     validated in all clinical     situations.     eGFR's persistently     <90 mL/min signify     possible Chronic Kidney Disease.  LIPASE, BLOOD     Status: None   Collection Time    08/13/12  9:42 PM      Result Value Range   Lipase 24  11 - 59 U/L  MRSA PCR SCREENING     Status: None   Collection Time    08/14/12  1:24 AM      Result Value Range   MRSA by PCR NEGATIVE  NEGATIVE   Comment:            The GeneXpert MRSA Assay (FDA  approved for NASAL specimens     only), is one component of a     comprehensive MRSA colonization     surveillance program. It is not     intended to diagnose MRSA     infection nor to guide or     monitor treatment for     MRSA infections.  BASIC METABOLIC PANEL     Status: Abnormal   Collection Time    08/14/12  5:00 AM      Result Value Range   Sodium 132 (*) 135 - 145 mEq/L   Comment: DELTA CHECK NOTED   Potassium 4.6  3.5 - 5.1 mEq/L   Chloride 94 (*) 96 - 112 mEq/L   CO2 27  19 - 32 mEq/L   Glucose, Bld 65 (*) 70 - 99 mg/dL   BUN 81 (*) 6 - 23 mg/dL   Creatinine, Ser 1.61 (*) 0.50 - 1.10 mg/dL   Calcium 9.9  8.4 - 09.6 mg/dL   GFR calc non Af Amer 9 (*) >90 mL/min   GFR calc Af Amer 10 (*) >90 mL/min   Comment:            The eGFR has been calculated     using the CKD EPI equation.     This calculation has not been     validated in all clinical     situations.     eGFR's persistently     <90  mL/min signify     possible Chronic Kidney Disease.  CBC     Status: Abnormal   Collection Time    08/14/12  5:00 AM      Result Value Range   WBC 13.7 (*) 4.0 - 10.5 K/uL   RBC 2.42 (*) 3.87 - 5.11 MIL/uL   Hemoglobin 7.2 (*) 12.0 - 15.0 g/dL   HCT 04.5 (*) 40.9 - 81.1 %   MCV 92.1  78.0 - 100.0 fL   MCH 29.8  26.0 - 34.0 pg   MCHC 32.3  30.0 - 36.0 g/dL   RDW 91.4 (*) 78.2 - 95.6 %   Platelets 224  150 - 400 K/uL   Constitutional: positive for night sweats, negative for chills, fatigue and fevers Ears, nose, mouth, throat, and face: negative for earaches, hoarseness, nasal congestion and sore throat Respiratory: positive for cough and dyspnea on exertion, negative for hemoptysis and sputum Cardiovascular: positive for chest pressure/discomfort and dyspnea, negative for chest pain and palpitations Gastrointestinal: positive for nausea, negative for abdominal pain, change in bowel habits and vomiting Genitourinary:negative, non-oliguric Musculoskeletal:negative for arthralgias, back pain, myalgias and neck pain Neurological: negative for dizziness, headaches, paresthesia and speech problems  Physical Exam: Filed Vitals:   08/14/12 0600  BP: 96/69  Pulse: 75  Temp:   Resp: 14     General appearance: alert, cooperative and no distress Head: Normocephalic, without obvious abnormality, atraumatic Neck: no adenopathy, no carotid bruit, no JVD and supple, symmetrical, trachea midline Resp: Coarse breath sounds bilaterally Cardio: RRR with Gr II/VI systolic murmur, no rub GI: soft, bowel sounds normal with mild right-to-mid upper abdominal tenderness Extremities: 2+ edema bilaterally Neurologic: Grossly normal Dialysis Access: Right IJ catheter   Assessment/Plan: 1. Dyspnea - secondary to volume overload vs HCAP; WBCs now 13.7 (18.9 yesterday), stable on O2 per [place], started Vancomycin & Zosyn; HD with UF goal of 4 L. 2. ESRD -  HD on TTS @ [facility]; K 4.6.  HD  today. 3. Hypertension/volume  - BP 93/64,  current wt 54.6 kg with EDW 53.5,  4. Anemia  - Hgb down to 7.2 today, on outpatient Epogen.  Transfuse 2 U PRBCs, Aranesp 200 mcg. 5. Metabolic bone disease -  Ca 9.9 (11.7 corrected), last P 3.3; no Hectorol, no binders.  Use 2Ca bath. 6. Nutrition - Alb 1.7, high protein diet, vitamin. 7. Dialysis access - Using right IJ catheter; L arm AVG (placed by Dr. Lyda Perone @ [facility] 10/25) ligated 1/6, after significant L arm swelling secondary to L SCV stenosis and pacemaker; new access at right arm pending per Dr. Hollace Hayward. 8. Hx CAD/Ischemic cardiomyopathy - NSTEMI in 04/2012; EF 10%. 9. Chronic pain - previously on Methadone.  LYLES,CHARLES 08/14/2012, 6:34 AM   Attending Nephrologist: Delano Metz, MD  Patient seen and examined.  Agree with assessment and plan as above. Vinson Moselle  MD [phone] pgr    [phone] cell 08/14/2012, 1:15 PM

## 2012-08-15 DIAGNOSIS — J189 Pneumonia, unspecified organism: Secondary | ICD-10-CM

## 2012-08-15 LAB — BASIC METABOLIC PANEL
BUN: 40 mg/dL — ABNORMAL HIGH (ref 6–23)
CO2: 23 mEq/L (ref 19–32)
Chloride: 96 mEq/L (ref 96–112)
Creatinine, Ser: 2.95 mg/dL — ABNORMAL HIGH (ref 0.50–1.10)
GFR calc Af Amer: 18 mL/min — ABNORMAL LOW (ref >90)
Potassium: 4.2 mEq/L (ref 3.5–5.1)

## 2012-08-15 LAB — CBC
HCT: 33.4 % — ABNORMAL LOW (ref 36.0–46.0)
MCV: 91 fL (ref 78.0–100.0)
RBC: 3.67 MIL/uL — ABNORMAL LOW (ref 3.87–5.11)
RDW: 18.9 % — ABNORMAL HIGH (ref 11.5–15.5)
WBC: 11.1 10*3/uL — ABNORMAL HIGH (ref 4.0–10.5)

## 2012-08-15 MED ORDER — GUAIFENESIN 100 MG/5ML PO SYRP
200.0000 mg | ORAL_SOLUTION | Freq: Four times a day (QID) | ORAL | Status: DC | PRN
  Filled 2012-08-15: qty 118

## 2012-08-15 MED ORDER — GUAIFENESIN 100 MG/5ML PO SOLN
10.0000 mL | Freq: Four times a day (QID) | ORAL | Status: DC | PRN
  Administered 2012-08-15 – 2012-09-03 (×9): 200 mg via ORAL
  Filled 2012-08-15 (×9): qty 10

## 2012-08-15 NOTE — Progress Notes (Signed)
[facility] PROGRESS NOTE  [name] WUJ:[id number] DOB: 02/07/1951 DOA: 08/13/2012 PCP: Jyl Heinz, MD  Brief narrative: 62 year old female from NH and with past medical history including but not limited to ESRD on HD (TTS), ischemic cardiomyopathy and EF of 10% status post ICD, chronic pancreatitis and recurrent pancreatic abscess who presented 08/13/2012 with complaints of worsening shortness of breath with associated cough and pleuritic chest pain. In NH, oxygen saturation was 85%. She remained short of breath even with supplemental oxygen. Patient additionally reported left sided abdominal pain but no associated nausea or vomiting.  In ED, evaluation included CXR which showed new right pleural effusion and right lung base opacity. ALP was elevated at 172, WBC count was 18.9 and lipase 24.   Assessment/Plan:   Principal Problem:  *SOB (shortness of breath)  Likely related to HCAP  Will continue vanco and zosyn for now For some reason blood cultures were not collected at the time of admission. This patient is already on vancomycin and Zosyn so blood cultures will likely turn out to be negative. If patient spikes fever we will collect blood cultures. Continue oxygen support via nasal canula to keep O2 saturation above 90%  Respiratory status is stable at this time Active Problems:  Chronic systolic congestive heart failure, NYHA class 3  Follow up 2 D ECHO results; last 2D echo was in November 2013 with ejection fraction of 10%  Pancreatitis, chronic  No abscess noted on CT abd/pelvis on this admission  Lipase WNL on this admission. Anemia in chronic kidney disease (CKD)  Hemoglobin 7.2 on admission. The patient has received 2 units of PRBCs 08/14/2012. Hemoglobin 10.4 today. ESRD on hemodialysis  Per renal Leukocytosis  Likely due to HCAP  Antibiotics as above Hyponatremia  Secondary to fluid overload, CHF, ESRD  Sodium stable at 132 HCAP (healthcare-associated  pneumonia)  Continue vanco and zosyn  Code Status: full code  Family Communication: no family at bedside  Disposition Plan: home when stable    Manson Passey, MD  [facility] Pager [phone]  Consultants:  Nephrology Procedures:  None  Antibiotics:  Vancomycin 08/13/2012 -->  Zosyn 08/13/2012 -->   If 7PM-7AM, please contact night-coverage [email or web address] Password [email or web address] 08/15/2012, 10:45 AM   LOS: 2 days    HPI/Subjective: No acute overnight events.  Objective: Filed Vitals:   08/15/12 0255 08/15/12 0355 08/15/12 0420 08/15/12 0841  BP: 115/79 123/93 122/81 120/83  Pulse: 85 87 88 88  Temp: 98.2 F (36.8 C) 97.4 F (36.3 C) 97.8 F (36.6 C)   TempSrc: Oral Oral Oral   Resp: 18 18 18 14   Height:      Weight:      SpO2:   99% 98%    Intake/Output Summary (Last 24 hours) at 08/15/12 1045 Last data filed at 08/15/12 8657  Gross per 24 hour  Intake    265 ml  Output     50 ml  Net    215 ml    Exam:   General:  Pt is alert, follows commands appropriately, not in acute distress  Cardiovascular: Regular rate and rhythm, S1/S2 appreciated  Respiratory: Bilateral air entry, crackles at bases  Abdomen: Soft, non tender, non distended, bowel sounds present, no guarding  Extremities: +2 lower extremity pitting edema, pulses DP and PT palpable bilaterally  Neuro: Grossly nonfocal  Data Reviewed: Basic Metabolic Panel:  Recent Labs Lab 08/13/12 2142 08/14/12 0500 08/15/12 0532  NA 125* 132* 132*  K 4.6 4.6 4.2  CL 88* 94* 96  CO2 23 27 23   GLUCOSE 83 65* 92  BUN 79* 81* 40*  CREATININE 4.77* 4.94* 2.95*  CALCIUM 10.1 9.9 9.6   Liver Function Tests:  Recent Labs Lab 08/13/12 2142  AST 20  ALT 11  ALKPHOS 172*  BILITOT 0.3  PROT 7.0  ALBUMIN 1.7*    Recent Labs Lab 08/13/12 2142  LIPASE 24   CBC:  Recent Labs Lab 08/13/12 2142 08/14/12 0500 08/15/12 0532  WBC 18.9* 13.7* 11.1*  NEUTROABS 16.2*  --   --   HGB 7.8* 7.2* 10.4*  HCT 24.7*  22.3* 33.4*  MCV 91.1 92.1 91.0  PLT 286 224 223    MRSA PCR SCREENING     Status: None   Collection Time    08/14/12  1:24 AM      Result Value Range Status   MRSA by PCR NEGATIVE  NEGATIVE Final     Studies: Dg Chest 1 View 08/14/2012   IMPRESSION: 1.  Grossly unchanged small right-sided effusion and right basilar opacities, atelectasis versus infiltrate. 2.  Suspected worsening of asymmetric right-sided pulmonary edema.   Original Report Authenticated By: Tacey Ruiz, MD    Dg Chest 2 View 08/13/2012  *  IMPRESSION: 1.  Lower lung volumes and new right pleural effusion, tracking into the right major fissure. 2.  Otherwise stable chest with severe cardiomegaly.   Original Report Authenticated By: Erskine Speed, M.D.    Ct Abdomen Pelvis W Contrast 08/14/2012  *  IMPRESSION: Pancreatic atrophy without a well-defined, drainable pseudocyst/abscess.  Scattered foci of gas along the pancreatic tail/spleen in the left upper abdomen.  2.0 x 1.0 cm gas and fluid collection adjacent the left lateral abdominal wall.  These findings are grossly unchanged.  Large volume abdominopelvic ascites, including peritoneal thickening / enhancement in the right paracolic gutter, grossly unchanged.      Scheduled Meds: . aspirin EC  81 mg Oral Daily  . enoxaparin (LOVENOX)   30 mg Subcutaneous Q24H  . feeding supplement   237 mL Oral BID WC  . methadone  10 mg Oral QID  . multivitamin  1 tablet Oral Daily  . pantoprazole  40 mg Oral Daily  .  (ZOSYN)  IV  2.25 g Intravenous Q8H  . sevelamer  400 mg Oral Q breakfast  . simvastatin  10 mg Oral q1800  .  vancomycin  500 mg Intravenous Q T,Th,Sa-HD

## 2012-08-15 NOTE — ED Provider Notes (Signed)
I saw and evaluated the patient, reviewed the resident's note and I agree with the findings and plan. Shortness of breath after missed dialysis. Pleural effusion on x-ray. Patient be admitted to medicine due to her hypoxia   Juliet Rude. Rubin Payor, MD 08/15/12 [phone]

## 2012-08-15 NOTE — Progress Notes (Signed)
Patient ID: Threasa Heads, female   DOB: 1951-05-19, 62 y.o.   MRN: [id number]  [facility] Progress Note    Subjective:   Feels better but still with "nagging dry cough"   Objective:   BP 120/83  Pulse 88  Temp(Src) 97.8 F (36.6 C) (Oral)  Resp 14  Ht 5\' 4"  (1.626 m)  Wt 51.71 kg (114 lb)  BMI 19.56 kg/m2  SpO2 98%  Intake/Output: I/O last 3 completed shifts: In: 265 [P.O.:240; Blood:25] Out: 3527 [Urine:50; Other:3477]   Intake/Output this shift:    Weight change: -2.89 kg (-6 lb 5.9 oz)  Physical Exam: WUJ:WJXBJ, chronically ill-appearing AAF inNAD CVS:no rub Resp:scattered exp wheezes, decreased BS at bases R>L Abd:+BS, soft, NT Ext:2+ pitting edema  Labs: BMET  Recent Labs Lab 08/13/12 2142 08/14/12 0500 08/15/12 0532  NA 125* 132* 132*  K 4.6 4.6 4.2  CL 88* 94* 96  CO2 23 27 23   GLUCOSE 83 65* 92  BUN 79* 81* 40*  CREATININE 4.77* 4.94* 2.95*  ALBUMIN 1.7*  --   --   CALCIUM 10.1 9.9 9.6   CBC  Recent Labs Lab 08/13/12 2142 08/14/12 0500 08/15/12 0532  WBC 18.9* 13.7* 11.1*  NEUTROABS 16.2*  --   --   HGB 7.8* 7.2* 10.4*  HCT 24.7* 22.3* 33.4*  MCV 91.1 92.1 91.0  PLT 286 224 223    @IMGRELPRIORS @ Medications:    . aspirin EC  81 mg Oral Daily  . enoxaparin (LOVENOX) injection  30 mg Subcutaneous Q24H  . feeding supplement (NEPRO CARB STEADY)  237 mL Oral BID WC  . methadone  10 mg Oral QID  . multivitamin  1 tablet Oral Daily  . pantoprazole  40 mg Oral Daily  . piperacillin-tazobactam (ZOSYN)  IV  2.25 g Intravenous Q8H  . sevelamer  400 mg Oral Q breakfast  . simvastatin  10 mg Oral q1800  . [START ON 08/16/2012] vancomycin  500 mg Intravenous Q T,Th,Sa-HD     Assessment/ Plan:   1. SOB- improved.  Pt feels better after UF with HD, however her pleural effusion and infiltrates persist despite being below her EDW.  Agree with treating for presumed HCAP.  Cont to follow 2. ESRDcont with HD qTTS 3. Anemia:on  ESA 4. Chronic pancreatitis- stable 5. Cirrhosis- pt with ascites and pitting edema.; cont with UF and nutritional support 6. Nutrition:as above 7. Hypertension:stable 8. Dispo- per primary svc  COLADONATO,JOSEPH A 08/15/2012, 10:48 AM

## 2012-08-16 LAB — CBC
HCT: 29.8 % — ABNORMAL LOW (ref 36.0–46.0)
Hemoglobin: 9.6 g/dL — ABNORMAL LOW (ref 12.0–15.0)
RBC: 3.29 MIL/uL — ABNORMAL LOW (ref 3.87–5.11)

## 2012-08-16 LAB — TYPE AND SCREEN
ABO/RH(D): O POS
Donor AG Type: NEGATIVE
Unit division: 0

## 2012-08-16 LAB — RENAL FUNCTION PANEL
CO2: 25 mEq/L (ref 19–32)
Calcium: 10.2 mg/dL (ref 8.4–10.5)
Chloride: 95 mEq/L — ABNORMAL LOW (ref 96–112)
Creatinine, Ser: 3.49 mg/dL — ABNORMAL HIGH (ref 0.50–1.10)
GFR calc Af Amer: 15 mL/min — ABNORMAL LOW (ref >90)
GFR calc non Af Amer: 13 mL/min — ABNORMAL LOW (ref >90)
Glucose, Bld: 182 mg/dL — ABNORMAL HIGH (ref 70–99)
Sodium: 133 mEq/L — ABNORMAL LOW (ref 135–145)

## 2012-08-16 MED ORDER — DOCUSATE SODIUM 100 MG PO CAPS
100.0000 mg | ORAL_CAPSULE | Freq: Two times a day (BID) | ORAL | Status: DC
  Administered 2012-08-16 – 2012-09-03 (×29): 100 mg via ORAL
  Filled 2012-08-16 (×39): qty 1

## 2012-08-16 MED ORDER — HEPARIN SODIUM (PORCINE) 1000 UNIT/ML DIALYSIS
100.0000 [IU]/kg | INTRAMUSCULAR | Status: DC | PRN
  Filled 2012-08-16: qty 6

## 2012-08-16 MED ORDER — SODIUM CHLORIDE 0.9 % IV SOLN: 100.0000 mL | INTRAVENOUS | Status: DC | PRN

## 2012-08-16 MED ORDER — NEPRO/CARBSTEADY PO LIQD: 237.0000 mL | ORAL | Status: DC | PRN

## 2012-08-16 MED ORDER — HEPARIN SODIUM (PORCINE) 1000 UNIT/ML DIALYSIS
1000.0000 [IU] | INTRAMUSCULAR | Status: DC | PRN
  Filled 2012-08-16: qty 1

## 2012-08-16 MED ORDER — DARBEPOETIN ALFA-POLYSORBATE 60 MCG/0.3ML IJ SOLN
INTRAMUSCULAR | Status: AC
  Filled 2012-08-16: qty 0.3

## 2012-08-16 MED ORDER — PENTAFLUOROPROP-TETRAFLUOROETH EX AERO: 1.0000 "application " | INHALATION_SPRAY | CUTANEOUS | Status: DC | PRN

## 2012-08-16 MED ORDER — LIDOCAINE-PRILOCAINE 2.5-2.5 % EX CREA: 1.0000 "application " | TOPICAL_CREAM | CUTANEOUS | Status: DC | PRN

## 2012-08-16 MED ORDER — HYDROMORPHONE HCL 2 MG PO TABS
ORAL_TABLET | ORAL | Status: AC
  Filled 2012-08-16: qty 1

## 2012-08-16 MED ORDER — ALTEPLASE 2 MG IJ SOLR: 2.0000 mg | Freq: Once | INTRAMUSCULAR | Status: DC | PRN

## 2012-08-16 MED ORDER — DARBEPOETIN ALFA-POLYSORBATE 60 MCG/0.3ML IJ SOLN
60.0000 ug | INTRAMUSCULAR | Status: DC
  Administered 2012-08-16: 60 ug via INTRAVENOUS

## 2012-08-16 MED ORDER — LIDOCAINE HCL (PF) 1 % IJ SOLN: 5.0000 mL | INTRAMUSCULAR | Status: DC | PRN

## 2012-08-16 MED ORDER — HYDROCOD POLST-CHLORPHEN POLST 10-8 MG/5ML PO LQCR
5.0000 mL | Freq: Two times a day (BID) | ORAL | Status: DC
  Administered 2012-08-16 – 2012-08-22 (×12): 5 mL via ORAL
  Filled 2012-08-16 (×18): qty 5

## 2012-08-16 NOTE — Progress Notes (Addendum)
[facility] PROGRESS NOTE  [name] ZOX:[id number] DOB: Mar 01, 1951 DOA: 08/13/2012 PCP: Jyl Heinz, MD  Brief narrative: 62 year old female from NH and with past medical history including but not limited to ESRD on HD (TTS), ischemic cardiomyopathy and EF of 10% status post ICD, chronic pancreatitis and recurrent pancreatic abscess who presented 08/13/2012 with complaints of worsening shortness of breath with associated cough and pleuritic chest pain. In NH, oxygen saturation was 85%. She remained short of breath even with supplemental oxygen. Patient additionally reported left sided abdominal pain but no associated nausea or vomiting.  In ED, evaluation included CXR which showed new right pleural effusion and right lung base opacity. ALP was elevated at 172, WBC count was 18.9 and lipase 24.   Assessment/Plan:  Principal Problem:  *SOB (shortness of breath)  Likely related to HCAP  WBC count trending up but no fevers in past 24 hours. We will continue antibiotics vanco and zosyn For some reason blood cultures were not collected at the time of admission. This patient is already on vancomycin and Zosyn so blood cultures will likely turn out to be negative. If patient spikes fever we will collect blood cultures.  Continue oxygen support via nasal canula to keep O2 saturation above 90%  Respiratory status is stable at this time Active Problems:  Chronic systolic congestive heart failure, NYHA class 3  Follow up 2 D ECHO - ordered but still pending; last 2D echo was in November 2013 with ejection fraction of 10%  Pancreatitis, chronic  No abscess noted on CT abd/pelvis on this admission  Lipase WNL on this admission. Anemia in chronic kidney disease (CKD)  Hemoglobin 7.2 on admission. The patient has received 2 units of PRBCs 08/14/2012.  Hemoglobin 10.4 and 9.6 ESRD on hemodialysis  Per renal HD TTS Leukocytosis  Likely due to HCAP  Antibiotics as above Hyponatremia   Secondary to fluid overload, CHF, ESRD  Sodium stable at 132 - 133 HCAP (healthcare-associated pneumonia)  Continue vanco and zosyn   Code Status: full code  Family Communication: no family at bedside  Disposition Plan: to SNF when stable   Manson Passey, MD  [facility]  Pager [phone]   Consultants:  Nephrology Procedures:  None  Antibiotics:  Vancomycin 08/13/2012 -->  Zosyn 08/13/2012 -->    If 7PM-7AM, please contact night-coverage [email or web address] Password [email or web address] 08/16/2012, 12:04 PM   LOS: 3 days   HPI/Subjective: No acute overnight events.  Objective: Filed Vitals:   08/16/12 1030 08/16/12 1100 08/16/12 1130 08/16/12 1146  BP: 123/91 119/89 111/85 133/88  Pulse: 95 94 101 95  Temp:    97.5 F (36.4 C)  TempSrc:    Oral  Resp:    18  Height:      Weight:    52.2 kg (115 lb 1.3 oz)  SpO2:    98%    Intake/Output Summary (Last 24 hours) at 08/16/12 1204 Last data filed at 08/16/12 1146  Gross per 24 hour  Intake   1100 ml  Output   3505 ml  Net  -2405 ml    Exam:   General:  Pt is alert, follows commands appropriately, not in acute distress  Cardiovascular: Regular rate and rhythm, S1/S2 appreciated  Respiratory: diminished breath sounds; no wheezing  Abdomen: Soft, non tender, non distended, bowel sounds present, no guarding  Extremities: LE (+1-2) pitting edema, pulses DP and PT palpable bilaterally  Neuro: Grossly nonfocal  Data Reviewed: Basic Metabolic Panel:  Recent Labs Lab  08/13/12 2142 08/14/12 0500 08/15/12 0532 08/16/12 0500  NA 125* 132* 132* 133*  K 4.6 4.6 4.2 3.8  CL 88* 94* 96 95*  CO2 23 27 23 25   GLUCOSE 83 65* 92 182*  BUN 79* 81* 40* 56*  CREATININE 4.77* 4.94* 2.95* 3.49*  CALCIUM 10.1 9.9 9.6 10.2  PHOS  --   --   --  4.5   Liver Function Tests:  Recent Labs Lab 08/13/12 2142 08/16/12 0500  AST 20  --   ALT 11  --   ALKPHOS 172*  --   BILITOT 0.3  --   PROT 7.0  --   ALBUMIN 1.7* 1.7*    Recent Labs Lab  08/13/12 2142  LIPASE 24   No results found for this basename: AMMONIA,  in the last 168 hours CBC:  Recent Labs Lab 08/13/12 2142 08/14/12 0500 08/15/12 0532 08/16/12 0500  WBC 18.9* 13.7* 11.1* 14.1*  NEUTROABS 16.2*  --   --   --   HGB 7.8* 7.2* 10.4* 9.6*  HCT 24.7* 22.3* 33.4* 29.8*  MCV 91.1 92.1 91.0 90.6  PLT 286 224 223 236    Recent Results (from the past 240 hour(s))  MRSA PCR SCREENING     Status: None   Collection Time    08/14/12  1:24 AM      Result Value Range Status   MRSA by PCR NEGATIVE  NEGATIVE Final  URINE CULTURE     Status: None   Collection Time    08/14/12  8:06 PM      Result Value Range Status   Specimen Description URINE, CLEAN CATCH   Final   Special Requests NONE   Final   Culture  Setup Time 08/15/2012 01:17   Final   Colony Count PENDING   Incomplete   Culture Culture reincubated for better growth   Final   Report Status PENDING   Incomplete     Studies: Dg Chest 1 View 08/14/2012  *  IMPRESSION: 1.  Grossly unchanged small right-sided effusion and right basilar opacities, atelectasis versus infiltrate. 2.  Suspected worsening of asymmetric right-sided pulmonary edema.   Original Report Authenticated By: Tacey Ruiz, MD    Ct Abdomen Pelvis W Contrast 08/14/2012  * IMPRESSION: Pancreatic atrophy without a well-defined, drainable pseudocyst/abscess.  Scattered foci of gas along the pancreatic tail/spleen in the left upper abdomen.  2.0 x 1.0 cm gas and fluid collection adjacent the left lateral abdominal wall.  These findings are grossly unchanged.  Large volume abdominopelvic ascites, including peritoneal thickening / enhancement in the right paracolic gutter, grossly unchanged.   Original Report Authenticated By: Charline Bills, M.D.     Scheduled Meds: . aspirin EC  81 mg Oral Daily  . Tussionex  5 mL Oral Q12H  . darbepoetin (ARANESP)   60 mcg Intravenous Q Tue-HD  . enoxaparin (LOVENOX)   30 mg Subcutaneous Q24H  .  (NEPRO CARB   237 mL Oral BID WC  . methadone  10 mg Oral QID  . multivitamin  1 tablet Oral Daily  . pantoprazole  40 mg Oral Daily  . piperacillin-tazobactam  2.25 g Intravenous Q8H  . sevelamer  400 mg Oral Q breakfast  . simvastatin  10 mg Oral q1800  . vancomycin  500 mg Intravenous Q T,Th,Sa-HD

## 2012-08-16 NOTE — Procedures (Signed)
Patient was seen on dialysis and the procedure was supervised. BFR 400 Via RIJ PC BP is 142/99.  Patient appears to be tolerating treatment well

## 2012-08-16 NOTE — Progress Notes (Signed)
Utilization review completed.  

## 2012-08-16 NOTE — Progress Notes (Signed)
Patient ID: [name], female   DOB: 08/20/1950, 62 y.o.   MRN: [id number]   [facility] Progress Note    Subjective:   Pt feels better this am.   Objective:   BP 118/83  Pulse 106  Temp(Src) 98.6 F (37 C) (Oral)  Resp 18  Ht 5\' 4"  (1.626 m)  Wt 55.5 kg (122 lb 5.7 oz)  BMI 20.99 kg/m2  SpO2 95%  Intake/Output: I/O last 3 completed shifts: In: 1352.5 [P.O.:1020; I.V.:220; Blood:12.5; IV Piggyback:100] Out: 50 [Urine:50]   Intake/Output this shift:    Weight change: 0 kg (0 lb)  Physical Exam: Gen:WD WN AAF in NAD CVS:no rub Resp:bibasilar crackles Abd:+BS, soft, NT Ext:3+edeama  Labs: BMET  Recent Labs Lab 08/13/12 2142 08/14/12 0500 08/15/12 0532 08/16/12 0500  NA 125* 132* 132* 133*  K 4.6 4.6 4.2 3.8  CL 88* 94* 96 95*  CO2 23 27 23 25   GLUCOSE 83 65* 92 182*  BUN 79* 81* 40* 56*  CREATININE 4.77* 4.94* 2.95* 3.49*  ALBUMIN 1.7*  --   --  1.7*  CALCIUM 10.1 9.9 9.6 10.2  PHOS  --   --   --  4.5   CBC  Recent Labs Lab 08/13/12 2142 08/14/12 0500 08/15/12 0532 08/16/12 0500  WBC 18.9* 13.7* 11.1* 14.1*  NEUTROABS 16.2*  --   --   --   HGB 7.8* 7.2* 10.4* 9.6*  HCT 24.7* 22.3* 33.4* 29.8*  MCV 91.1 92.1 91.0 90.6  PLT 286 224 223 236    @IMGRELPRIORS @ Medications:    . aspirin EC  81 mg Oral Daily  . chlorpheniramine-HYDROcodone  5 mL Oral Q12H  . enoxaparin (LOVENOX) injection  30 mg Subcutaneous Q24H  . feeding supplement (NEPRO CARB STEADY)  237 mL Oral BID WC  . methadone  10 mg Oral QID  . multivitamin  1 tablet Oral Daily  . pantoprazole  40 mg Oral Daily  . piperacillin-tazobactam (ZOSYN)  IV  2.25 g Intravenous Q8H  . sevelamer  400 mg Oral Q breakfast  . simvastatin  10 mg Oral q1800  . vancomycin  500 mg Intravenous Q T,Th,Sa-HD     Assessment/ Plan:   1. SOB- improved. Pt feels better after UF with HD, however her pleural effusion and infiltrates persist despite being below her EDW. Agree with  treating for presumed HCAP. Cont to follow 2. ESRDcont with HD qTTS.  Cont to challenge EDW 3. Anemia:on ESA however not on medlist today.  Will resume. 4. Chronic pancreatitis- stable 5. Cirrhosis- pt with ascites and pitting edema.; cont with UF and nutritional support 6. Hyponatremia- due to cirrhosis/volume overload.  Improving with UF and HD. 7. Nutrition:as above 8. Hypertension:stable 9. Dispo- per primary svc    COLADONATO,JOSEPH A 08/16/2012, 9:15 AM

## 2012-08-17 DIAGNOSIS — I059 Rheumatic mitral valve disease, unspecified: Secondary | ICD-10-CM

## 2012-08-17 DIAGNOSIS — Z992 Dependence on renal dialysis: Secondary | ICD-10-CM

## 2012-08-17 LAB — CBC
HCT: 24 % — ABNORMAL LOW (ref 36.0–46.0)
Hemoglobin: 7.6 g/dL — ABNORMAL LOW (ref 12.0–15.0)
MCH: 29.1 pg (ref 26.0–34.0)
MCHC: 31.7 g/dL (ref 30.0–36.0)
MCV: 92 fL (ref 78.0–100.0)
Platelets: 154 10*3/uL (ref 150–400)
RBC: 2.61 MIL/uL — ABNORMAL LOW (ref 3.87–5.11)
RDW: 18.8 % — ABNORMAL HIGH (ref 11.5–15.5)
WBC: 11.2 10*3/uL — ABNORMAL HIGH (ref 4.0–10.5)

## 2012-08-17 LAB — BASIC METABOLIC PANEL
BUN: 33 mg/dL — ABNORMAL HIGH (ref 6–23)
CO2: 21 mEq/L (ref 19–32)
Calcium: 8.1 mg/dL — ABNORMAL LOW (ref 8.4–10.5)
Chloride: 77 mEq/L — ABNORMAL LOW (ref 96–112)
Creatinine, Ser: 2.23 mg/dL — ABNORMAL HIGH (ref 0.50–1.10)
GFR calc Af Amer: 26 mL/min — ABNORMAL LOW (ref >90)
GFR calc non Af Amer: 22 mL/min — ABNORMAL LOW (ref >90)
Glucose, Bld: 475 mg/dL — ABNORMAL HIGH (ref 70–99)
Potassium: 3.1 mEq/L — ABNORMAL LOW (ref 3.5–5.1)
Sodium: 129 mEq/L — ABNORMAL LOW (ref 135–145)

## 2012-08-17 MED ORDER — LIDOCAINE HCL (PF) 1 % IJ SOLN: 5.0000 mL | INTRAMUSCULAR | Status: DC | PRN

## 2012-08-17 MED ORDER — RENA-VITE PO TABS
1.0000 | ORAL_TABLET | Freq: Every day | ORAL | Status: DC
  Administered 2012-08-17 – 2012-08-18 (×2): 1 via ORAL
  Administered 2012-08-19: 22:00:00 via ORAL
  Administered 2012-08-20: 1 via ORAL
  Administered 2012-08-21: 21:00:00 via ORAL
  Administered 2012-08-22 – 2012-08-28 (×7): 1 via ORAL
  Administered 2012-08-29: 12:00:00 via ORAL
  Administered 2012-08-29 – 2012-09-01 (×4): 1 via ORAL
  Filled 2012-08-17 (×18): qty 1

## 2012-08-17 MED ORDER — NEPRO/CARBSTEADY PO LIQD: 237.0000 mL | ORAL | Status: DC | PRN

## 2012-08-17 MED ORDER — SODIUM CHLORIDE 0.9 % IV SOLN: 100.0000 mL | INTRAVENOUS | Status: DC | PRN

## 2012-08-17 MED ORDER — LEVOFLOXACIN 500 MG PO TABS
500.0000 mg | ORAL_TABLET | ORAL | Status: DC
  Filled 2012-08-17: qty 1

## 2012-08-17 MED ORDER — ALTEPLASE 2 MG IJ SOLR: 2.0000 mg | Freq: Once | INTRAMUSCULAR | Status: AC | PRN

## 2012-08-17 MED ORDER — LIDOCAINE-PRILOCAINE 2.5-2.5 % EX CREA: 1.0000 "application " | TOPICAL_CREAM | CUTANEOUS | Status: DC | PRN

## 2012-08-17 MED ORDER — HEPARIN SODIUM (PORCINE) 1000 UNIT/ML DIALYSIS
1000.0000 [IU] | INTRAMUSCULAR | Status: DC | PRN
  Filled 2012-08-17: qty 1

## 2012-08-17 MED ORDER — HEPARIN SODIUM (PORCINE) 1000 UNIT/ML DIALYSIS
20.0000 [IU]/kg | INTRAMUSCULAR | Status: DC | PRN
  Filled 2012-08-17: qty 1

## 2012-08-17 MED ORDER — ALTEPLASE 2 MG IJ SOLR: 2.0000 mg | Freq: Once | INTRAMUSCULAR | Status: DC | PRN

## 2012-08-17 MED ORDER — LEVOFLOXACIN 750 MG PO TABS
750.0000 mg | ORAL_TABLET | Freq: Once | ORAL | Status: AC
  Administered 2012-08-17: 750 mg via ORAL
  Filled 2012-08-17 (×2): qty 1

## 2012-08-17 MED ORDER — LEVOFLOXACIN 750 MG PO TABS: 750.0000 mg | ORAL_TABLET | ORAL | Status: DC

## 2012-08-17 MED ORDER — PENTAFLUOROPROP-TETRAFLUOROETH EX AERO: 1.0000 "application " | INHALATION_SPRAY | CUTANEOUS | Status: DC | PRN

## 2012-08-17 MED ORDER — HEPARIN SODIUM (PORCINE) 1000 UNIT/ML DIALYSIS: 1000.0000 [IU] | INTRAMUSCULAR | Status: DC | PRN

## 2012-08-17 MED ORDER — HYDROMORPHONE HCL 2 MG PO TABS
ORAL_TABLET | ORAL | Status: AC
  Administered 2012-08-17: 2 mg via ORAL
  Filled 2012-08-17: qty 2

## 2012-08-17 NOTE — Procedures (Signed)
Patient was seen on dialysis and the procedure was supervised. BFR 400 Via LAVF BP is 136/96.  Patient appears to be tolerating treatment well.  This is an extra treatment due to the development of worsening SOB and need for more UF.  Will cont with regular schedule TTS as well.  Will cont to challenge EDW as BP tolerates.

## 2012-08-17 NOTE — Progress Notes (Signed)
[facility] Progress Note  Subjective:   Still coughing  Objective Filed Vitals:   08/16/12 1251 08/16/12 1743 08/16/12 2019 08/17/12 0503  BP: 127/81 122/78 105/70 122/89  Pulse: 110 98 84 92  Temp: 98.2 F (36.8 C) 98.4 F (36.9 C) 97.7 F (36.5 C) 98 F (36.7 C)  TempSrc: Oral Oral Oral Oral  Resp: 18 18 18 18   Height:   5\' 4"  (1.626 m)   Weight:   52.201 kg (115 lb 1.3 oz)   SpO2: 99% 98% 96% 100%   Physical Exam General: very talkative; on room air Heart: RRR Lungs: crackles bilateral R> L with some rhonchi on right Abdomen: soft + ascites Extremities:1+ LE edema Dialysis Access: right I-J  Dialysis Orders: Center: [facility] on TTS.  EDW 53.5 kg HD Bath 3K/2.5Ca Time 4hrs Heparin 1600 U. Access Right IJ catheter BFR 400 DFR 800 Hectorol 0 mcg IV/HD Epogen 1500 Units IV/HD Venofer 0.   Assessment/Plan:  1. SOB/presumed HCAP/right pleural effusion; WBC improving; on Vanc and zoysn - imporved; Aranesp 60 2. ESRD - TTS HD; chronic hyponatremia secondary to cirrhosis 3. Anemia - Hgb 9.6 s/p transfusion 2 units 4. Secondary hyperparathyroidism - P controlled; no hectorol; corrected Ca elevated; will use 2 Ca bath tomorrow if K high enough 5. HTN/volume/ascities - BP ok; will have a lower EDW at d/c; still with 1 + edema of LE 6. Nutrition - renal diet + nepro 7. Chronic pancreatitis - stable  Sheffield Slider, PA-C [facility] Beeper [phone] 08/17/2012,9:27 AM  LOS: 4 days    Additional Objective Labs: Basic Metabolic Panel:  Recent Labs Lab 08/14/12 0500 08/15/12 0532 08/16/12 0500  NA 132* 132* 133*  K 4.6 4.2 3.8  CL 94* 96 95*  CO2 27 23 25   GLUCOSE 65* 92 182*  BUN 81* 40* 56*  CREATININE 4.94* 2.95* 3.49*  CALCIUM 9.9 9.6 10.2  PHOS  --   --  4.5   Liver Function Tests:  Recent Labs Lab 08/13/12 2142 08/16/12 0500  AST 20  --   ALT 11  --   ALKPHOS 172*  --   BILITOT 0.3  --   PROT 7.0  --   ALBUMIN 1.7* 1.7*     Recent Labs Lab 08/13/12 2142  LIPASE 24   CBC:  Recent Labs Lab 08/13/12 2142 08/14/12 0500 08/15/12 0532 08/16/12 0500  WBC 18.9* 13.7* 11.1* 14.1*  NEUTROABS 16.2*  --   --   --   HGB 7.8* 7.2* 10.4* 9.6*  HCT 24.7* 22.3* 33.4* 29.8*  MCV 91.1 92.1 91.0 90.6  PLT 286 224 223 236   Blood Culture    Component Value Date/Time   SDES URINE, CLEAN CATCH 08/14/2012 2006   SPECREQUEST NONE 08/14/2012 2006   CULT Culture reincubated for better growth 08/14/2012 2006   REPTSTATUS PENDING 08/14/2012 2006   Medications:   . aspirin EC  81 mg Oral Daily  . chlorpheniramine-HYDROcodone  5 mL Oral Q12H  . darbepoetin (ARANESP) injection - DIALYSIS  60 mcg Intravenous Q Tue-HD  . docusate sodium  100 mg Oral BID  . enoxaparin (LOVENOX) injection  30 mg Subcutaneous Q24H  . feeding supplement (NEPRO CARB STEADY)  237 mL Oral BID WC  . methadone  10 mg Oral QID  . multivitamin  1 tablet Oral QHS  . pantoprazole  40 mg Oral Daily  . piperacillin-tazobactam (ZOSYN)  IV  2.25 g Intravenous Q8H  . sevelamer  400 mg  Oral Q breakfast  . simvastatin  10 mg Oral q1800  . vancomycin  500 mg Intravenous Q T,Th,Sa-HD     I have seen and examined this patient and agree with plan as outlined by M. Lenox Ponds A,MD 08/17/2012 10:53 AM

## 2012-08-17 NOTE — Evaluation (Signed)
Physical Therapy Evaluation Patient Details Name: [name] MRN: [id number] DOB: [birth year] Today's Date: 08/17/2012 Time: 0920-1012 PT Time Calculation (min): 52 min  PT Assessment / Plan / Recommendation Clinical Impression  Patient is a 62 y/o female admitted with SOB/PNA from skilled nursing facility.  Well known to PT from previous admission.  She wishes to return home at this time with HHPT, HHaide, HHRN and plans to set up help for most of the day with church members.  Encouraged her to work on this as she currently is supervision for mobility.  Will benefit from skilled PT in the acute setting to address decreased strength, decreased balance, decreased activity tolerance and educate on home safety.    PT Assessment  Patient needs continued PT services    Follow Up Recommendations  Home health PT;Supervision for mobility/OOB;Other (comment) (HH aide, HHRN)    Does the patient have the potential to tolerate intense rehabilitation    N/A  Barriers to Discharge  Decreased caregiver support      Equipment Recommendations  Other (comment) (rollator walker)    Recommendations for Other Services OT consult   Frequency Min 3X/week    Precautions / Restrictions Precautions Precautions: Fall Precaution Comments: contact isolation (orange)   Pertinent Vitals/Pain NR 102; SpO2 98% on room air, denies pain      Mobility  Bed Mobility Bed Mobility: Sit to Supine Sit to Supine: 5: Supervision;HOB flat Details for Bed Mobility Assistance: performed without rails and cues for getting legs all they way up in the bed Transfers Transfers: Sit to Stand;Stand to Sit Sit to Stand: 5: Supervision;From chair/3-in-1;With armrests Stand to Sit: To bed;6: Modified independent (Device/Increase time);With upper extremity assist Details for Transfer Assistance: cues to scoot to edge of chair and get feet back and encouragement to stand getting legs extended before reaching up to  walker. Ambulation/Gait Ambulation/Gait Assistance: 5: Supervision Ambulation Distance (Feet): 220 Feet Assistive device: Rolling walker Ambulation/Gait Assistance Details: able to turn walker without assist, slow pace with slight shuffle pattern; able to look around without loss of balance, denied SOB with ambulation or needing any rest breaks Gait Pattern: Step-through pattern;Decreased hip/knee flexion - right;Decreased hip/knee flexion - left;Decreased dorsiflexion - right;Decreased dorsiflexion - left;Shuffle;Trunk flexed    Exercises General Exercises - Lower Extremity Long Arc Quad: AROM;Both;10 reps;Seated Toe Raises: AROM;Both;10 reps;Seated Heel Raises: AROM;5 reps;Both;Seated   PT Diagnosis: Abnormality of gait;Generalized weakness  PT Problem List: Decreased balance;Decreased mobility;Decreased activity tolerance;Decreased strength;Decreased safety awareness PT Treatment Interventions: DME instruction;Gait training;Functional mobility training;Therapeutic activities;Therapeutic exercise;Patient/family education;Balance training   PT Goals Acute Rehab PT Goals PT Goal Formulation: With patient Time For Goal Achievement: 08/31/12 Potential to Achieve Goals: Good Pt will go Supine/Side to Sit: with modified independence;with HOB 0 degrees PT Goal: Supine/Side to Sit - Progress: Goal set today Pt will go Sit to Supine/Side: with modified independence;with HOB 0 degrees PT Goal: Sit to Supine/Side - Progress: Goal set today Pt will go Sit to Stand: with modified independence;with upper extremity assist PT Goal: Sit to Stand - Progress: Goal set today Pt will Stand: with modified independence;with unilateral upper extremity support;3 - 5 min PT Goal: Stand - Progress: Goal set today Pt will Ambulate: >150 feet;with modified independence;with least restrictive assistive device PT Goal: Ambulate - Progress: Goal set today Pt will Perform Home Exercise Program: with supervision,  verbal cues required/provided PT Goal: Perform Home Exercise Program - Progress: Goal set today  Visit Information  Last PT Received On: 08/17/12 Assistance  Needed: +1    Subjective Data  Subjective: I got this in the nursing home. Patient Stated Goal: To go back to her home   Prior Functioning  Home Living Lives With: Alone Available Help at Discharge: Friend(s);Family;Available PRN/intermittently Type of Home: House Home Access: Level entry Home Layout: One level Bathroom Shower/Tub: Engineer, manufacturing systems: Standard Home Adaptive Equipment: Bedside commode/3-in-1;Tub transfer bench;Wheelchair - manual;Walker - rolling;Straight cane;Hand-held shower hose Prior Function Level of Independence: Needs assistance Needs Assistance: Bathing;Gait;Transfers Bath: Minimal Gait Assistance: was getting therapy at SNF for gait and transfers Comments: Was at [facility] PTA since first part of Feb.  Reports had about 2 more weeks planned, but now wishes to return home.  Plans to work to get assistance from church members for much of the day (hopes for about 16 hours).  Communication Communication: No difficulties    Cognition  Cognition Overall Cognitive Status: Appears within functional limits for tasks assessed/performed Arousal/Alertness: Awake/alert Orientation Level: Appears intact for tasks assessed Behavior During Session: [facility] for tasks performed    Extremity/Trunk Assessment Right Lower Extremity Assessment RLE ROM/Strength/Tone: Deficits RLE ROM/Strength/Tone Deficits: AROM WFL with edema in lower legs and stiffiness in ankles; and tenderness with palpation to right calf; strength grossly 4/5 throughout Left Lower Extremity Assessment LLE ROM/Strength/Tone: Deficits LLE ROM/Strength/Tone Deficits: AROM WFL with edema in lower legs and stiffness in ankles; strength grossly 4/5 throughout   Balance Balance Balance Assessed: Yes Dynamic Standing Balance Dynamic  Standing - Balance Support: Bilateral upper extremity supported Dynamic Standing - Level of Assistance: 5: Stand by assistance Dynamic Standing - Comments: turning and side stepping up to head of bed   End of Session PT - End of Session Equipment Utilized During Treatment: Gait belt Activity Tolerance: Patient tolerated treatment well Patient left: in bed;with call bell/phone within reach  GP     [facility] 08/17/2012, 10:40 AM Sheran Lawless, PT [phone] 08/17/2012

## 2012-08-17 NOTE — Progress Notes (Signed)
Echocardiogram 2D Echocardiogram has been performed.  [name], [name], 10:59 AM

## 2012-08-17 NOTE — Progress Notes (Signed)
ANTIBIOTIC CONSULT NOTE - FOLLOW UP  Pharmacy Consult for Vancomycin + Zosyn Indication: Empiric HCAP coverage  Allergies  Allergen Reactions  . Codeine Itching  . Morphine And Related Shortness Of Breath    SOB when given IV once "to me it was mild; I called out fast for help; never had to intubate" (05/23/2012)  . Ace Inhibitors Other (See Comments)    Unknown reaction but her physician stated that she can't take it (specifically lisinopril)  . Tylenol (Acetaminophen) Other (See Comments)    Patient states that the doctor told her she has a Fatty Liver.    Patient Measurements: Height: 5\' 4"  (162.6 cm) Weight: 115 lb 1.3 oz (52.201 kg) IBW/kg (Calculated) : 54.7  Vital Signs: Temp: 98 F (36.7 C) (03/05 0503) Temp src: Oral (03/05 0503) BP: 122/89 mmHg (03/05 0503) Pulse Rate: 92 (03/05 0503) Intake/Output from previous day: 03/04 0701 - 03/05 0700 In: 1110 [P.O.:960; IV Piggyback:150] Out: 3530 [Urine:25] Intake/Output from this shift:    Labs:  Recent Labs  08/15/12 0532 08/16/12 0500  WBC 11.1* 14.1*  HGB 10.4* 9.6*  PLT 223 236  CREATININE 2.95* 3.49*   Estimated Creatinine Clearance: 13.6 ml/min (by C-G formula based on Cr of 3.49). No results found for this basename: VANCOTROUGH, Leodis Binet, VANCORANDOM, GENTTROUGH, GENTPEAK, GENTRANDOM, TOBRATROUGH, TOBRAPEAK, TOBRARND, AMIKACINPEAK, AMIKACINTROU, AMIKACIN,  in the last 72 hours   Microbiology: Recent Results (from the past 720 hour(s))  MRSA PCR SCREENING     Status: None   Collection Time    08/14/12  1:24 AM      Result Value Range Status   MRSA by PCR NEGATIVE  NEGATIVE Final   Comment:            The GeneXpert MRSA Assay (FDA     approved for NASAL specimens     only), is one component of a     comprehensive MRSA colonization     surveillance program. It is not     intended to diagnose MRSA     infection nor to guide or     monitor treatment for     MRSA infections.  URINE CULTURE      Status: None   Collection Time    08/14/12  8:06 PM      Result Value Range Status   Specimen Description URINE, CLEAN CATCH   Final   Special Requests NONE   Final   Culture  Setup Time 08/15/2012 01:17   Final   Colony Count PENDING   Incomplete   Culture Culture reincubated for better growth   Final   Report Status PENDING   Incomplete    Anti-infectives   Start     Dose/Rate Route Frequency Ordered Stop   08/16/12 1200  vancomycin (VANCOCIN) 500 mg in sodium chloride 0.9 % 100 mL IVPB     500 mg 100 mL/hr over 60 Minutes Intravenous Every T-Th-Sa (Hemodialysis) 08/14/12 0128     08/14/12 2030  piperacillin-tazobactam (ZOSYN) IVPB 2.25 g     2.25 g 100 mL/hr over 30 Minutes Intravenous Every 8 hours 08/14/12 2010     08/14/12 0800  piperacillin-tazobactam (ZOSYN) IVPB 2.25 g  Status:  Discontinued     2.25 g 100 mL/hr over 30 Minutes Intravenous Every 8 hours 08/14/12 0128 08/14/12 2010   08/14/12 0130  vancomycin (VANCOCIN) 1,250 mg in sodium chloride 0.9 % 250 mL IVPB     1,250 mg 166.7 mL/hr over 90 Minutes Intravenous  Once  08/14/12 0128 08/14/12 0359   08/14/12 0130  piperacillin-tazobactam (ZOSYN) IVPB 2.25 g     2.25 g 100 mL/hr over 30 Minutes Intravenous  Once 08/14/12 0128 08/14/12 0259      Assessment: 62 y.o. F with ESRD on Vancomycin + Zosyn for empiric HCAP coverage. The patient was loaded with Vancomycin on 3/2 prior to HD and did not receive an additional MD post-dialysis that day however did receive the normal maintenance dose after HD on 3/4. Estimated level is likely still within range to treat HCAP. Will consider checking a level if treatment continues. Afebrile, WBC 14.1. Doses remain appropriate.   Goal of Therapy:  Pre-HD Vancomycin level of 15-25 mcg/ml  Plan:  1. Continue Vancomycin 500 mg post HD sessions on T/Th/Sat 2. Continue Zosyn 2.25g IV every 8 hours 3. Will continue to follow HD schedule/duration, culture results, LOT, and antibiotic  de-escalation plans   Georgina Pillion, PharmD, BCPS Clinical Pharmacist Pager: [phone] 08/17/2012 8:36 AM

## 2012-08-17 NOTE — Progress Notes (Addendum)
[facility] PROGRESS NOTE  Assessment/Plan: *SOB due to HCAP ans vol overload: - On vanc and zosyn, afebrile, leucocytosis improving. - change antibiotics to levaquin. - mild pulmonary edema on CXR, + JVD, crackles at bases.    Hyponatremia - resolved with HD    ESRD on hemodialysis - HD TTS    Anemia in chronic kidney disease (CKD) - Hemoglobin 7.2 on admission. The patient has received 2 units of PRBCs 08/14/2012. - - Hemoglobin 10.4 and 9.6    Pancreatitis, chronic - stable.   Chronic systolic congestive heart failure, NYHA class 3 - decompensated.    Hypertension - BP stable.  Code Status: full code  Family Communication: no family at bedside  Disposition Plan: to SNF when stable     Consultants:  nephrology  Procedures:  none  Antibiotics:  vanc zosyn 3.4.2014- 3.5.2014  levaquin 3.5.2014  HPI/Subjective Complaining of cough  Objective: Filed Vitals:   08/16/12 2019 08/17/12 0503 08/17/12 0900 08/17/12 0950  BP: 105/70 122/89 113/61   Pulse: 84 92 87 102  Temp: 97.7 F (36.5 C) 98 F (36.7 C)    TempSrc: Oral Oral Oral   Resp: 18 18 18    Height: 5\' 4"  (1.626 m)     Weight: 52.201 kg (115 lb 1.3 oz)     SpO2: 96% 100% 100% 98%    Intake/Output Summary (Last 24 hours) at 08/17/12 1107 Last data filed at 08/17/12 0900  Gross per 24 hour  Intake   1350 ml  Output   3530 ml  Net  -2180 ml   Filed Weights   08/16/12 0735 08/16/12 1146 08/16/12 2019  Weight: 55.5 kg (122 lb 5.7 oz) 52.2 kg (115 lb 1.3 oz) 52.201 kg (115 lb 1.3 oz)    Exam:  General: Alert, awake, oriented x3, in no acute distress.  HEENT: No bruits, no goiter. + JVD Heart: Regular rate and rhythm, without murmurs, rubs, gallops.  Lungs: Good air movement, crackles bilaterally. Abdomen: Soft, nontender, nondistended, positive bowel sounds.  Neuro: Grossly intact, nonfocal.   Data Reviewed: Basic Metabolic Panel:  Recent Labs Lab 08/13/12 2142  08/14/12 0500 08/15/12 0532 08/16/12 0500  NA 125* 132* 132* 133*  K 4.6 4.6 4.2 3.8  CL 88* 94* 96 95*  CO2 23 27 23 25   GLUCOSE 83 65* 92 182*  BUN 79* 81* 40* 56*  CREATININE 4.77* 4.94* 2.95* 3.49*  CALCIUM 10.1 9.9 9.6 10.2  PHOS  --   --   --  4.5   Liver Function Tests:  Recent Labs Lab 08/13/12 2142 08/16/12 0500  AST 20  --   ALT 11  --   ALKPHOS 172*  --   BILITOT 0.3  --   PROT 7.0  --   ALBUMIN 1.7* 1.7*    Recent Labs Lab 08/13/12 2142  LIPASE 24   No results found for this basename: AMMONIA,  in the last 168 hours CBC:  Recent Labs Lab 08/13/12 2142 08/14/12 0500 08/15/12 0532 08/16/12 0500  WBC 18.9* 13.7* 11.1* 14.1*  NEUTROABS 16.2*  --   --   --   HGB 7.8* 7.2* 10.4* 9.6*  HCT 24.7* 22.3* 33.4* 29.8*  MCV 91.1 92.1 91.0 90.6  PLT 286 224 223 236   Cardiac Enzymes: No results found for this basename: CKTOTAL, CKMB, CKMBINDEX, TROPONINI,  in the last 168 hours BNP (last 3 results)  Recent Labs  04/21/12 1419 05/08/12 0515 06/02/12 1944  PROBNP >70000.0* >70000.0* >70000.0*  CBG: No results found for this basename: GLUCAP,  in the last 168 hours  Recent Results (from the past 240 hour(s))  MRSA PCR SCREENING     Status: None   Collection Time    08/14/12  1:24 AM      Result Value Range Status   MRSA by PCR NEGATIVE  NEGATIVE Final   Comment:            The GeneXpert MRSA Assay (FDA     approved for NASAL specimens     only), is one component of a     comprehensive MRSA colonization     surveillance program. It is not     intended to diagnose MRSA     infection nor to guide or     monitor treatment for     MRSA infections.  URINE CULTURE     Status: None   Collection Time    08/14/12  8:06 PM      Result Value Range Status   Specimen Description URINE, CLEAN CATCH   Final   Special Requests NONE   Final   Culture  Setup Time 08/15/2012 01:17   Final   Colony Count PENDING   Incomplete   Culture Culture reincubated  for better growth   Final   Report Status PENDING   Incomplete     Studies: No results found.  Scheduled Meds: . aspirin EC  81 mg Oral Daily  . chlorpheniramine-HYDROcodone  5 mL Oral Q12H  . darbepoetin (ARANESP) injection - DIALYSIS  60 mcg Intravenous Q Tue-HD  . docusate sodium  100 mg Oral BID  . enoxaparin (LOVENOX) injection  30 mg Subcutaneous Q24H  . feeding supplement (NEPRO CARB STEADY)  237 mL Oral BID WC  . methadone  10 mg Oral QID  . multivitamin  1 tablet Oral QHS  . pantoprazole  40 mg Oral Daily  . piperacillin-tazobactam (ZOSYN)  IV  2.25 g Intravenous Q8H  . sevelamer  400 mg Oral Q breakfast  . simvastatin  10 mg Oral q1800  . vancomycin  500 mg Intravenous Q T,Th,Sa-HD   Continuous Infusions:    Marinda Elk  [facility] Pager [phone].  If 8PM-8AM, please contact night-coverage at [email or web address], password [facility] 08/17/2012, 11:07 AM  LOS: 4 days

## 2012-08-18 ENCOUNTER — Inpatient Hospital Stay (HOSPITAL_COMMUNITY): Payer: Medicare Other

## 2012-08-18 DIAGNOSIS — I5023 Acute on chronic systolic (congestive) heart failure: Principal | ICD-10-CM

## 2012-08-18 LAB — RENAL FUNCTION PANEL
Albumin: 1.6 g/dL — ABNORMAL LOW (ref 3.5–5.2)
BUN: 37 mg/dL — ABNORMAL HIGH (ref 6–23)
CO2: 27 mEq/L (ref 19–32)
Calcium: 10.5 mg/dL (ref 8.4–10.5)
Chloride: 94 mEq/L — ABNORMAL LOW (ref 96–112)
Creatinine, Ser: 2.52 mg/dL — ABNORMAL HIGH (ref 0.50–1.10)
GFR calc Af Amer: 22 mL/min — ABNORMAL LOW (ref >90)
GFR calc non Af Amer: 19 mL/min — ABNORMAL LOW (ref >90)
Glucose, Bld: 221 mg/dL — ABNORMAL HIGH (ref 70–99)
Phosphorus: 3.4 mg/dL (ref 2.3–4.6)
Potassium: 3.9 mEq/L (ref 3.5–5.1)
Sodium: 131 mEq/L — ABNORMAL LOW (ref 135–145)

## 2012-08-18 LAB — CBC
HCT: 28.1 % — ABNORMAL LOW (ref 36.0–46.0)
Hemoglobin: 9 g/dL — ABNORMAL LOW (ref 12.0–15.0)
MCH: 29.1 pg (ref 26.0–34.0)
MCHC: 32 g/dL (ref 30.0–36.0)
MCV: 90.9 fL (ref 78.0–100.0)
Platelets: 193 10*3/uL (ref 150–400)
RBC: 3.09 MIL/uL — ABNORMAL LOW (ref 3.87–5.11)
RDW: 18.6 % — ABNORMAL HIGH (ref 11.5–15.5)
WBC: 16.4 10*3/uL — ABNORMAL HIGH (ref 4.0–10.5)

## 2012-08-18 MED ORDER — LIDOCAINE HCL (PF) 1 % IJ SOLN: 5.0000 mL | INTRAMUSCULAR | Status: DC | PRN

## 2012-08-18 MED ORDER — SODIUM CHLORIDE 0.9 % IV SOLN: 100.0000 mL | INTRAVENOUS | Status: DC | PRN

## 2012-08-18 MED ORDER — DARBEPOETIN ALFA-POLYSORBATE 40 MCG/0.4ML IJ SOLN
40.0000 ug | Freq: Once | INTRAMUSCULAR | Status: AC
  Administered 2012-08-18: 40 ug via INTRAVENOUS
  Filled 2012-08-18: qty 0.4

## 2012-08-18 MED ORDER — METOPROLOL TARTRATE 12.5 MG HALF TABLET
12.5000 mg | ORAL_TABLET | Freq: Two times a day (BID) | ORAL | Status: DC
  Administered 2012-08-18 – 2012-08-27 (×14): 12.5 mg via ORAL
  Filled 2012-08-18 (×20): qty 1

## 2012-08-18 MED ORDER — NEPRO/CARBSTEADY PO LIQD: 237.0000 mL | ORAL | Status: DC | PRN

## 2012-08-18 MED ORDER — DARBEPOETIN ALFA-POLYSORBATE 40 MCG/0.4ML IJ SOLN
INTRAMUSCULAR | Status: AC
  Filled 2012-08-18: qty 0.4

## 2012-08-18 MED ORDER — LIDOCAINE-PRILOCAINE 2.5-2.5 % EX CREA: 1.0000 "application " | TOPICAL_CREAM | CUTANEOUS | Status: DC | PRN

## 2012-08-18 MED ORDER — HEPARIN SODIUM (PORCINE) 1000 UNIT/ML DIALYSIS: 1000.0000 [IU] | INTRAMUSCULAR | Status: DC | PRN

## 2012-08-18 MED ORDER — DARBEPOETIN ALFA-POLYSORBATE 100 MCG/0.5ML IJ SOLN: 100.0000 ug | INTRAMUSCULAR | Status: DC

## 2012-08-18 MED ORDER — ALTEPLASE 2 MG IJ SOLR: 2.0000 mg | Freq: Once | INTRAMUSCULAR | Status: AC | PRN

## 2012-08-18 MED ORDER — PENTAFLUOROPROP-TETRAFLUOROETH EX AERO: 1.0000 "application " | INHALATION_SPRAY | CUTANEOUS | Status: DC | PRN

## 2012-08-18 NOTE — Progress Notes (Signed)
[facility] Progress Note  Subjective:   Sat up in chair last night coughing  Objective Filed Vitals:   08/17/12 1725 08/17/12 1854 08/17/12 2155 08/18/12 0519  BP: 117/83 121/80 120/80 123/81  Pulse: 86 83 104 95  Temp: 98 F (36.7 C) 98 F (36.7 C) 99.5 F (37.5 C) 98.4 F (36.9 C)  TempSrc: Oral Oral Oral Oral  Resp: 18 18 17 17   Height:      Weight: 52.2 kg (115 lb 1.3 oz)  52.2 kg (115 lb 1.3 oz)   SpO2: 100% 100% 100% 98%   Physical Exam General:eating breakfast, dry cough Heart: RRR Lungs: crackles bilateral R> L Abdomen: soft + ascites Extremities:2+ LE edema Dialysis Access: right I-J  Dialysis Orders: Center: [facility] on TTS.  EDW 53.5 kg HD Bath 3K/2.5Ca Time 4hrs Heparin 1600 U. Access Right IJ catheter BFR 400 DFR 800 Hectorol 0 mcg IV/HD Epogen 1500 Units IV/HD Venofer 0.   Assessment/Plan:  1. SOB/presumed HCAP/right pleural effusion; WBC improving; on Vanc and zoysn - imporved; Aranesp 60; had extra HD treatment Wed with goal set for UF, but only 1.5 removed.  Needs more volume off; increase goal to 3. L; recheck CXR post HD 2. ESRD - TTS HD; chronic hyponatremia secondary to cirrhosis 3. Anemia - Hgb 7.6 yesterday drifting down after prior transfusion; recheck today and transfuse prn- check F 4. Secondary hyperparathyroidism - P controlled; no hectorol; corrected Ca elevated; needs added K bath 5. HTN/volume/ascities - BP ok; will have a lower EDW at d/c 6. Nutrition - renal diet + nepro - eating small amounts 7. Chronic pancreatitis - stable  Sheffield Slider, PA-C [facility] Beeper [phone] 08/18/2012,8:31 AM  LOS: 5 days    Additional Objective Labs: Basic Metabolic Panel:  Recent Labs Lab 08/15/12 0532 08/16/12 0500 08/17/12 1400  NA 132* 133* 129*  K 4.2 3.8 3.1*  CL 96 95* 77*  CO2 23 25 21   GLUCOSE 92 182* 475*  BUN 40* 56* 33*  CREATININE 2.95* 3.49* 2.23*  CALCIUM 9.6 10.2 8.1*  PHOS  --  4.5  --     Liver Function Tests:  Recent Labs Lab 08/13/12 2142 08/16/12 0500  AST 20  --   ALT 11  --   ALKPHOS 172*  --   BILITOT 0.3  --   PROT 7.0  --   ALBUMIN 1.7* 1.7*    Recent Labs Lab 08/13/12 2142  LIPASE 24   CBC:  Recent Labs Lab 08/13/12 2142 08/14/12 0500 08/15/12 0532 08/16/12 0500 08/17/12 1400  WBC 18.9* 13.7* 11.1* 14.1* 11.2*  NEUTROABS 16.2*  --   --   --   --   HGB 7.8* 7.2* 10.4* 9.6* 7.6*  HCT 24.7* 22.3* 33.4* 29.8* 24.0*  MCV 91.1 92.1 91.0 90.6 92.0  PLT 286 224 223 236 154   Blood Culture    Component Value Date/Time   SDES URINE, CLEAN CATCH 08/14/2012 2006   SPECREQUEST NONE 08/14/2012 2006   CULT Culture reincubated for better growth 08/14/2012 2006   REPTSTATUS PENDING 08/14/2012 2006   Medications:   . aspirin EC  81 mg Oral Daily  . chlorpheniramine-HYDROcodone  5 mL Oral Q12H  . darbepoetin (ARANESP) injection - DIALYSIS  60 mcg Intravenous Q Tue-HD  . docusate sodium  100 mg Oral BID  . enoxaparin (LOVENOX) injection  30 mg Subcutaneous Q24H  . feeding supplement (NEPRO CARB STEADY)  237 mL Oral BID WC  . [START  ON 08/19/2012] levofloxacin  500 mg Oral Q48H  . methadone  10 mg Oral QID  . multivitamin  1 tablet Oral QHS  . pantoprazole  40 mg Oral Daily  . piperacillin-tazobactam (ZOSYN)  IV  2.25 g Intravenous Q8H  . sevelamer  400 mg Oral Q breakfast  . simvastatin  10 mg Oral q1800  . vancomycin  500 mg Intravenous Q T,Th,Sa-HD     I have seen and examined this patient and agree with plan as outlined by M. Alphonzo Dublin B.,MD 08/18/2012 8:31 AM   I have seen and examined this patient and agree with plan as outlined by M. Doran Durand, PA-C.  Discussed case with Dr. Robb Matar and will try for more fluid removal with HD. [name] COLADONATO,JOSEPH A,MD 08/18/2012 10:56 AM

## 2012-08-18 NOTE — Progress Notes (Signed)
INITIAL NUTRITION ASSESSMENT  DOCUMENTATION CODES Per approved criteria  -Severe malnutrition in the context of chronic illness   INTERVENTION:  1. Continue Nepro Shake PO BID 2. RD to continue to follow nutrition care plan  NUTRITION DIAGNOSIS: Increased nutrient needs related to HD as evidenced by estimated needs.   Goal: Pt to meet >/= 90% of their estimated nutrition needs.  Monitor:  weight trends, lab trends, I/O's, PO intake, supplement tolerance  Reason for Assessment: Health History  62 y.o. female  Admitting Dx: SOB (shortness of breath)  ASSESSMENT: Admitted with worsening SOB, work-up reveals HCAP. Pt also noted to have missed HD session PTA.  Pt with hx of frequent hospitalizations. Per conversation with patient, she reports that her oral intake has been fairly stable since she was last admitted. Pt well known to this RD. Enjoys Nepro Shakes.  Nutrition Focused Physical Exam:  Subcutaneous Fat:  Orbital Region: moderate Upper Arm Region: severe  Thoracic and Lumbar Region: n/a   Muscle:  Temple Region: severe  Clavicle Bone Region: severe  Clavicle and Acromion Bone Region: moderate  Scapular Bone Region: n/a  Dorsal Hand: n/a  Patellar Region: n/a  Anterior Thigh Region: n/a  Posterior Calf Region: n/a  Edema: +3 edema  Pt meets criteria for severe MALNUTRITION in the context of chronic illness as evidenced by severe muscle and fat mass loss.   Height: Ht Readings from Last 1 Encounters:  08/16/12 5\' 4"  (1.626 m)    Weight: Wt Readings from Last 1 Encounters:  08/17/12 115 lb 1.3 oz (52.2 kg)    Ideal Body Weight: 120 lb  % Ideal Body Weight: 96%  Wt Readings from Last 10 Encounters:  08/17/12 115 lb 1.3 oz (52.2 kg)  08/03/12 120 lb (54.432 kg)  07/19/12 113 lb 5.1 oz (51.4 kg)  07/07/12 113 lb 12.1 oz (51.6 kg)  06/03/12 124 lb 1.9 oz (56.3 kg)  05/27/12 128 lb (58.06 kg)  05/11/12 119 lb 4.3 oz (54.1 kg)  05/11/12 119 lb 4.3 oz  (54.1 kg)  04/23/12 119 lb 14.9 oz (54.4 kg)  04/04/12 117 lb 15.1 oz (53.5 kg)    Usual Body Weight: 115 - 120 lb  % Usual Body Weight: 100%  BMI:  Body mass index is 19.74 kg/(m^2). Weight is WNL.  Estimated Nutritional Needs: Kcal: 1600 - 1750 Protein: 65 - 75 grams Fluid: 1.2 liters  Skin: intact  Diet Order: Renal 60-70; 1200 ml fluid restriction  EDUCATION NEEDS: -No education needs identified at this time   Intake/Output Summary (Last 24 hours) at 08/18/12 0754 Last data filed at 08/18/12 0400  Gross per 24 hour  Intake   1200 ml  Output   1500 ml  Net   -300 ml    Last BM: 3/5  Labs:   Recent Labs Lab 08/15/12 0532 08/16/12 0500 08/17/12 1400  NA 132* 133* 129*  K 4.2 3.8 3.1*  CL 96 95* 77*  CO2 23 25 21   BUN 40* 56* 33*  CREATININE 2.95* 3.49* 2.23*  CALCIUM 9.6 10.2 8.1*  PHOS  --  4.5  --   GLUCOSE 92 182* 475*    CBG (last 3)  No results found for this basename: GLUCAP,  in the last 72 hours  Scheduled Meds: . aspirin EC  81 mg Oral Daily  . chlorpheniramine-HYDROcodone  5 mL Oral Q12H  . darbepoetin (ARANESP) injection - DIALYSIS  60 mcg Intravenous Q Tue-HD  . docusate sodium  100 mg Oral  BID  . enoxaparin (LOVENOX) injection  30 mg Subcutaneous Q24H  . feeding supplement (NEPRO CARB STEADY)  237 mL Oral BID WC  . [START ON 08/19/2012] levofloxacin  500 mg Oral Q48H  . methadone  10 mg Oral QID  . multivitamin  1 tablet Oral QHS  . pantoprazole  40 mg Oral Daily  . piperacillin-tazobactam (ZOSYN)  IV  2.25 g Intravenous Q8H  . sevelamer  400 mg Oral Q breakfast  . simvastatin  10 mg Oral q1800  . vancomycin  500 mg Intravenous Q T,Th,Sa-HD    Continuous Infusions:   Past Medical History  Diagnosis Date  . Coronary artery disease     s/p CABG 2008 with multiple PCIs  . Ischemic cardiomyopathy     Cath 04/2012, significant calcifications  . History of nonadherence to medical treatment   . Systolic CHF     16/1096 echo EF  10%  . Esophageal varices   . Cirrhosis     hx of fatty liver per patient, has known ascites with repeated paracenteses at [facility] as of 2013, also +hx of esoph varices with banding in the past  . Pacemaker   . ICD (implantable cardiac defibrillator) in place   . Hypertension     "used to have HTN; now I'm low" (05/23/2012)  . NSTEMI (non-ST elevated myocardial infarction)     04/2012; Trop peaked to 1.39; 05/23/2012 pt denies ever having MI  . Pneumonia 02/2012    "first time ever" (05/23/2012)  . Chronic bronchitis 1970's thru 2002    "went away when I stopped smoking in 2002" (05/23/2012)  . Diabetes mellitus     Diet controlled  . History of blood transfusion     "alot of them" (05/23/2012)  . Iron (Fe) deficiency anemia     "severe" (05/23/2012)  . Pancreatitis, chronic 10/04/2011    Had severe gallstone pancreatitis in May 2003, no surgery, treated medically then returned in June 2003 for cholecystectomy and cyst gastrostomy for pancreatic pseudocyst. In March 2013 was admitted for hemorrhage into psueodcyst. In April 2013 was treated at [facility] for polymicrobial bacteremia (fungal, MRSA and enterobacter) felt to be due to ruptured pseudocyst, treated medically. Admitted Jan 2014 with abd pain and had gas and fluid in pancreatic bed. She refused surgery ("would not make it") and had fluid aspirated by IR- gram stain showed yeast and cultures grew enterococcus species, sensitive to amp and vanc. She was seen by ID and discharged on IV vanc/fortaz with HD and po voriconazole.     [name] ESRD on hemodialysis 05/06/2012    T,Th,Sat HD by [facility] on [address]. Started hemodialysis around July 2013.    [name] CHF (congestive heart failure)   . Complication of vascular access for dialysis 07/13/2012    L arm AVG (placed by Dr[facility] Lyda Perone [facility]) was ligated 1/6 after significant L arm swelling secondary to L SCV stenosis and pacemaker; new access to be placed at right arm on 1/29 by Dr.  Hollace Hayward      Past Surgical History  Procedure Laterality Date  . Esophagogastroduodenoscopy  09/15/2011    Procedure: ESOPHAGOGASTRODUODENOSCOPY (EGD);  Surgeon: Theda Belfast, MD;  Location: [facility] ENDOSCOPY;  Service: Endoscopy;  Laterality: N/A;  . Cholecystectomy  2003  . Tonsillectomy and adenoidectomy  1961  . Appendectomy  1973?  [name] Vaginal hysterectomy  1973?  [name] Tubal ligation  1972  . Dilation and curettage of uterus      "bunch  from profuse bleeding in the 1970's" (05/23/2012)  . Coronary artery bypass graft  2008    CABG X4  . Coronary angioplasty with stent placement  2008    "1; day after CABG" (05/23/2012)  . Cardiac catheterization      "before 2008 and 3 wk ago" (05/23/2012)  . Insert / replace / remove pacemaker  06/2011    pacemaker ICD  . Cardiac defibrillator placement  06/2011  . Arteriovenous graft placement  03/2012    right antecub  . Insertion of dialysis catheter  ~ 10/2011    right chest  . Peritoneal catheter insertion  08/2011  . Peritoneal catheter removal  ~ 10/2011    Jarold Motto MS, RD, LDN Pager: [phone] After-hours pager: [phone]

## 2012-08-18 NOTE — Progress Notes (Signed)
Physical Therapy Treatment Patient Details Name: [name] MRN: [id number] DOB: 24-Mar-1951 Today's Date: 08/18/2012 Time: 4098-1191 PT Time Calculation (min): 11 min  PT Assessment / Plan / Recommendation Comments on Treatment Session  Patient eager to walk today.  Likely to HD right after x-ray.  Will attempt to see later in pm if time allows and she is back from dialysis.    Follow Up Recommendations  Home health PT;Supervision for mobility/OOB;Other (comment) (HH aide, HHRN)           Equipment Recommendations  Other (comment) (rollator walker)       Frequency Min 3X/week   Plan Discharge plan remains appropriate    Precautions / Restrictions Precautions Precautions: Fall Precaution Comments: contact isolation (orange)   Pertinent Vitals/Pain No complaints    Mobility  Bed Mobility Sit to Supine: 5: Supervision;HOB flat Details for Bed Mobility Assistance: pt up in recliner.  States she slept there due to coughing in the bed; returned to bed due to needing to go for x-ray Transfers Sit to Stand: 5: Supervision;4: Min guard;From chair/3-in-1;With upper extremity assist;With armrests Stand to Sit: 5: Supervision;4: Min guard;To chair/3-in-1;To bed;With upper extremity assist Details for Transfer Assistance: supervision from recliner, minguard from 3:1 to stand; sitting on 3:1 with armrests and minguard for safety Ambulation/Gait Ambulation/Gait Assistance: 4: Min assist Ambulation Distance (Feet): 3 Feet Assistive device: 1 person hand held assist Ambulation/Gait Assistance Details: 3:1 to bed only due to needing to go in bed for x ray. Gait Pattern: Trunk flexed;Shuffle         PT Goals Acute Rehab PT Goals Pt will go Sit to Supine/Side: with modified independence;with HOB 0 degrees PT Goal: Sit to Supine/Side - Progress: Progressing toward goal Pt will go Sit to Stand: with modified independence;with upper extremity assist PT Goal: Sit to Stand - Progress:  Progressing toward goal Pt will Stand: with modified independence;with unilateral upper extremity support;3 - 5 min PT Goal: Stand - Progress: Progressing toward goal Pt will Ambulate: >150 feet;with modified independence;with least restrictive assistive device PT Goal: Ambulate - Progress: Progressing toward goal  Visit Information  Last PT Received On: 08/18/12    Subjective Data  Subjective: I've been waiting for someone to help me on the pot.   Cognition  Cognition Overall Cognitive Status: Appears within functional limits for tasks assessed/performed Arousal/Alertness: Awake/alert Orientation Level: Appears intact for tasks assessed Behavior During Session: [facility] for tasks performed       End of Session PT - End of Session Activity Tolerance: Other (comment) (treatment limited to to transporter for x-ray arriving) Patient left: in bed   GP     [facility] 08/18/2012, 12:05 PM Sheran Lawless, PT [phone] 08/18/2012

## 2012-08-18 NOTE — Procedures (Signed)
[facility]  On HD via R IJ TDC BP--125/81 Goal--3.5 L Hgb--7.6 on Aranesp 60/wk.  Fe/TIBC , ferritin 1513 (on 20            Feb at [facility])--will increase aranesp to 100/week K+--3.1--now on 4 K bath No c/o

## 2012-08-18 NOTE — Progress Notes (Signed)
[facility] PROGRESS NOTE  Assessment/Plan: *SOB due to HCAP ans vol overload: - On vanc and zosyn 3.4.2014- 3.5.2014, afebrile. - change antibiotics to levaquin. - still with  + JVD, crackles at bases lower extremity edema.    Hyponatremia - resolved with HD    ESRD on hemodialysis - HD TTS. - Received an additional HD treatment  3.6.2014. - complaining of SOB and cough laying flat.    Anemia in chronic kidney disease (CKD) - Hemoglobin 7.2 on admission. The patient has received 2 units of PRBCs 08/14/2012.  -  Hemoglobin 10.4 and 9.6    Pancreatitis, chronic - stable.   Chronic systolic congestive heart failure, NYHA class 3 - decompensated - Received an additional HD treatment  3.6.2014. - start metoprolol.    Hypertension - BP stable.  Code Status: full code  Family Communication: no family at bedside  Disposition Plan: to SNF when stable     Consultants:  nephrology  Procedures: Echo 3.5.2014: The estimated ejection fraction was in the range of 20% to 25%, Peak RV-RA gradient: 41mm Hg (S).      Antibiotics:  vanc zosyn 3.4.2014- 3.5.2014  levaquin 3.5.2014  HPI/Subjective Complaining of cough, specially when laying flat Slept all day in chair.  Objective: Filed Vitals:   08/17/12 1725 08/17/12 1854 08/17/12 2155 08/18/12 0519  BP: 117/83 121/80 120/80 123/81  Pulse: 86 83 104 95  Temp: 98 F (36.7 C) 98 F (36.7 C) 99.5 F (37.5 C) 98.4 F (36.9 C)  TempSrc: Oral Oral Oral Oral  Resp: 18 18 17 17   Height:      Weight: 52.2 kg (115 lb 1.3 oz)  52.2 kg (115 lb 1.3 oz)   SpO2: 100% 100% 100% 98%    Intake/Output Summary (Last 24 hours) at 08/18/12 0841 Last data filed at 08/18/12 0400  Gross per 24 hour  Intake   1200 ml  Output   1500 ml  Net   -300 ml   Filed Weights   08/17/12 1410 08/17/12 1725 08/17/12 2155  Weight: 54.1 kg (119 lb 4.3 oz) 52.2 kg (115 lb 1.3 oz) 52.2 kg (115 lb 1.3 oz)    Exam:  General: Alert,  awake, oriented x3, in no acute distress.  HEENT: No bruits, no goiter. + JVD Heart: Regular rate and rhythm, without murmurs, rubs, gallops.  Lungs: Good air movement, crackles bilaterally. Abdomen: Soft, nontender, nondistended, positive bowel sounds.  EXT: lower extremity edema.   Data Reviewed: Basic Metabolic Panel:  Recent Labs Lab 08/13/12 2142 08/14/12 0500 08/15/12 0532 08/16/12 0500 08/17/12 1400  NA 125* 132* 132* 133* 129*  K 4.6 4.6 4.2 3.8 3.1*  CL 88* 94* 96 95* 77*  CO2 23 27 23 25 21   GLUCOSE 83 65* 92 182* 475*  BUN 79* 81* 40* 56* 33*  CREATININE 4.77* 4.94* 2.95* 3.49* 2.23*  CALCIUM 10.1 9.9 9.6 10.2 8.1*  PHOS  --   --   --  4.5  --    Liver Function Tests:  Recent Labs Lab 08/13/12 2142 08/16/12 0500  AST 20  --   ALT 11  --   ALKPHOS 172*  --   BILITOT 0.3  --   PROT 7.0  --   ALBUMIN 1.7* 1.7*    Recent Labs Lab 08/13/12 2142  LIPASE 24   No results found for this basename: AMMONIA,  in the last 168 hours CBC:  Recent Labs Lab 08/13/12 2142 08/14/12 0500 08/15/12 0532 08/16/12 0500 08/17/12  1400  WBC 18.9* 13.7* 11.1* 14.1* 11.2*  NEUTROABS 16.2*  --   --   --   --   HGB 7.8* 7.2* 10.4* 9.6* 7.6*  HCT 24.7* 22.3* 33.4* 29.8* 24.0*  MCV 91.1 92.1 91.0 90.6 92.0  PLT 286 224 223 236 154   Cardiac Enzymes: No results found for this basename: CKTOTAL, CKMB, CKMBINDEX, TROPONINI,  in the last 168 hours BNP (last 3 results)  Recent Labs  04/21/12 1419 05/08/12 0515 06/02/12 1944  PROBNP >70000.0* >70000.0* >70000.0*   CBG: No results found for this basename: GLUCAP,  in the last 168 hours  Recent Results (from the past 240 hour(s))  MRSA PCR SCREENING     Status: None   Collection Time    08/14/12  1:24 AM      Result Value Range Status   MRSA by PCR NEGATIVE  NEGATIVE Final   Comment:            The GeneXpert MRSA Assay (FDA     approved for NASAL specimens     only), is one component of a     comprehensive  MRSA colonization     surveillance program. It is not     intended to diagnose MRSA     infection nor to guide or     monitor treatment for     MRSA infections.  URINE CULTURE     Status: None   Collection Time    08/14/12  8:06 PM      Result Value Range Status   Specimen Description URINE, CLEAN CATCH   Final   Special Requests NONE   Final   Culture  Setup Time 08/15/2012 01:17   Final   Colony Count PENDING   Incomplete   Culture Culture reincubated for better growth   Final   Report Status PENDING   Incomplete     Studies: No results found.  Scheduled Meds: . aspirin EC  81 mg Oral Daily  . chlorpheniramine-HYDROcodone  5 mL Oral Q12H  . darbepoetin (ARANESP) injection - DIALYSIS  60 mcg Intravenous Q Tue-HD  . docusate sodium  100 mg Oral BID  . enoxaparin (LOVENOX) injection  30 mg Subcutaneous Q24H  . feeding supplement (NEPRO CARB STEADY)  237 mL Oral BID WC  . [START ON 08/19/2012] levofloxacin  500 mg Oral Q48H  . methadone  10 mg Oral QID  . multivitamin  1 tablet Oral QHS  . pantoprazole  40 mg Oral Daily  . piperacillin-tazobactam (ZOSYN)  IV  2.25 g Intravenous Q8H  . sevelamer  400 mg Oral Q breakfast  . simvastatin  10 mg Oral q1800  . vancomycin  500 mg Intravenous Q T,Th,Sa-HD   Continuous Infusions:    Marinda Elk  [facility] Pager [phone].  If 8PM-8AM, please contact night-coverage at [email or web address], password [facility] 08/18/2012, 8:41 AM  LOS: 5 days

## 2012-08-19 ENCOUNTER — Inpatient Hospital Stay (HOSPITAL_COMMUNITY): Payer: Medicare Other

## 2012-08-19 DIAGNOSIS — R509 Fever, unspecified: Secondary | ICD-10-CM | POA: Diagnosis present

## 2012-08-19 DIAGNOSIS — D72829 Elevated white blood cell count, unspecified: Secondary | ICD-10-CM

## 2012-08-19 DIAGNOSIS — I5023 Acute on chronic systolic (congestive) heart failure: Principal | ICD-10-CM

## 2012-08-19 DIAGNOSIS — K859 Acute pancreatitis without necrosis or infection, unspecified: Secondary | ICD-10-CM

## 2012-08-19 LAB — CBC
HCT: 28.9 % — ABNORMAL LOW (ref 36.0–46.0)
Hemoglobin: 8.8 g/dL — ABNORMAL LOW (ref 12.0–15.0)
WBC: 13.4 10*3/uL — ABNORMAL HIGH (ref 4.0–10.5)

## 2012-08-19 LAB — URINE CULTURE: Colony Count: >=100000

## 2012-08-19 LAB — PROTIME-INR: INR: 1.7 — ABNORMAL HIGH (ref 0.00–1.49)

## 2012-08-19 MED ORDER — SODIUM CHLORIDE 0.9 % IV SOLN
100.0000 mg | INTRAVENOUS | Status: DC
  Administered 2012-08-19 – 2012-08-22 (×4): 100 mg via INTRAVENOUS
  Filled 2012-08-19 (×5): qty 100

## 2012-08-19 MED ORDER — PIPERACILLIN-TAZOBACTAM IN DEX 2-0.25 GM/50ML IV SOLN
2.2500 g | Freq: Three times a day (TID) | INTRAVENOUS | Status: DC
  Administered 2012-08-19 – 2012-08-23 (×12): 2.25 g via INTRAVENOUS
  Filled 2012-08-19 (×16): qty 50

## 2012-08-19 MED ORDER — SODIUM CHLORIDE 0.9 % IV SOLN
125.0000 mg | INTRAVENOUS | Status: DC
  Administered 2012-08-20 – 2012-09-03 (×7): 125 mg via INTRAVENOUS
  Filled 2012-08-19 (×12): qty 10

## 2012-08-19 MED ORDER — VANCOMYCIN HCL 500 MG IV SOLR
500.0000 mg | INTRAVENOUS | Status: DC
  Administered 2012-08-20 – 2012-08-23 (×2): 500 mg via INTRAVENOUS
  Filled 2012-08-19 (×2): qty 500

## 2012-08-19 MED ORDER — VORICONAZOLE 200 MG IV SOLR: 4.0000 mg/kg | Freq: Two times a day (BID) | INTRAVENOUS | Status: DC

## 2012-08-19 MED ORDER — IOHEXOL 300 MG/ML  SOLN
25.0000 mL | INTRAMUSCULAR | Status: AC
  Administered 2012-08-19: 25 mL via ORAL

## 2012-08-19 MED ORDER — DEXTROSE 5 % IV SOLN
2.0000 g | Freq: Once | INTRAVENOUS | Status: DC
  Filled 2012-08-19: qty 2

## 2012-08-19 MED ORDER — VANCOMYCIN HCL IN DEXTROSE 1-5 GM/200ML-% IV SOLN
1000.0000 mg | Freq: Once | INTRAVENOUS | Status: AC
  Administered 2012-08-19: 1000 mg via INTRAVENOUS
  Filled 2012-08-19: qty 200

## 2012-08-19 MED ORDER — DARBEPOETIN ALFA-POLYSORBATE 100 MCG/0.5ML IJ SOLN
100.0000 ug | INTRAMUSCULAR | Status: DC
  Filled 2012-08-19: qty 0.5

## 2012-08-19 MED ORDER — DEXTROSE 5 % IV SOLN: 2.0000 g | INTRAVENOUS | Status: DC

## 2012-08-19 MED ORDER — HYDROMORPHONE HCL PF 1 MG/ML IJ SOLN
1.0000 mg | Freq: Once | INTRAMUSCULAR | Status: AC
  Administered 2012-08-19: 1 mg via INTRAVENOUS
  Filled 2012-08-19: qty 1

## 2012-08-19 MED ORDER — SODIUM CHLORIDE 0.9 % IV SOLN: INTRAVENOUS | Status: DC

## 2012-08-19 NOTE — Progress Notes (Signed)
Physical Therapy Treatment Patient Details Name: [name] MRN: [id number] DOB: 16-Jan-1951 Today's Date: 08/19/2012 Time: 4098-1191 PT Time Calculation (min): 33 min  PT Assessment / Plan / Recommendation Comments on Treatment Session  Pt is eager to walk and hopes to go home soon. She ambulate 64' with RW and supervision, no LOB. HR 98 with walking, SaO2 97% on RA. Good progress with mobility.     Follow Up Recommendations  Home health PT;Supervision for mobility/OOB;Other (comment) (HH aide, HHRN)     Does the patient have the potential to tolerate intense rehabilitation     Barriers to Discharge        Equipment Recommendations  Other (comment) (rollator walker)    Recommendations for Other Services OT consult  Frequency Min 3X/week   Plan      Precautions / Restrictions Precautions Precautions: Fall Precaution Comments: contact isolation (orange)   Pertinent Vitals/Pain *HR 98 with walking, SaO2 97% on RA No c/o pain with walking**    Mobility  Bed Mobility Bed Mobility: Not assessed Transfers Sit to Stand: With upper extremity assist;5: Supervision;From bed Stand to Sit: 5: Supervision;To chair/3-in-1;With upper extremity assist;With armrests Details for Transfer Assistance: VCs for hand placement for sit to stand from bed and stand to sit to chair Ambulation/Gait Ambulation/Gait Assistance: 5: Supervision Ambulation Distance (Feet): 160 Feet Assistive device: Rolling walker Gait Pattern: Trunk flexed Gait velocity: decreased General Gait Details: VCs for forward head position, pt able to only partially correct, stated she felt heavy from extra fluid    Exercises     PT Diagnosis:    PT Problem List:   PT Treatment Interventions:     PT Goals Acute Rehab PT Goals PT Goal Formulation: With patient Potential to Achieve Goals: Good Pt will go Sit to Supine/Side: with modified independence;with HOB 0 degrees Pt will go Sit to Stand: with modified  independence;with upper extremity assist PT Goal: Sit to Stand - Progress: Progressing toward goal Pt will Stand: with modified independence;with unilateral upper extremity support;3 - 5 min Pt will Ambulate: >150 feet;with modified independence;with least restrictive assistive device PT Goal: Ambulate - Progress: Progressing toward goal  Visit Information  Last PT Received On: 08/19/12 Assistance Needed: +1    Subjective Data  Subjective: I am going to walk today.  I don't ever want to go back to a rehab facility, I got pneumonia and a skin disease there.  Patient Stated Goal: to go home   Cognition  Cognition Overall Cognitive Status: Appears within functional limits for tasks assessed/performed Arousal/Alertness: Awake/alert Orientation Level: Appears intact for tasks assessed Behavior During Session: [facility] for tasks performed    Balance     End of Session PT - End of Session Equipment Utilized During Treatment: Gait belt Activity Tolerance: Patient tolerated treatment well Patient left: with call bell/phone within reach;in chair;with nursing in room   GP     Ralene Bathe Kistler 08/19/2012, 11:08 AM [phone]

## 2012-08-19 NOTE — Progress Notes (Signed)
ANTIBIOTIC CONSULT NOTE - INITIAL  Pharmacy Consult for Zosyn Indication: rule out pneumonia; and abdominal abscess covreage  Allergies  Allergen Reactions  . Codeine Itching  . Morphine And Related Shortness Of Breath    SOB when given IV once "to me it was mild; I called out fast for help; never had to intubate" (05/23/2012)  . Ace Inhibitors Other (See Comments)    Unknown reaction but her physician stated that she can't take it (specifically lisinopril)  . Tylenol (Acetaminophen) Other (See Comments)    Patient states that the doctor told her she has a Fatty Liver.    Patient Measurements: Height: 5\' 4"  (162.6 cm) Weight: 113 lb 1.5 oz (51.3 kg) IBW/kg (Calculated) : 54.7  Vital Signs: Temp: 98.5 F (36.9 C) (03/07 1414) Temp src: Oral (03/07 1021) BP: 104/77 mmHg (03/07 1414) Pulse Rate: 93 (03/07 1414) Intake/Output from previous day: 03/06 0701 - 03/07 0700 In: 360 [P.O.:360] Out: 2981 [Stool:1] Intake/Output from this shift: Total I/O In: 120 [P.O.:120] Out: -   Labs:  Recent Labs  08/17/12 1400 08/18/12 1344 08/19/12 1021  WBC 11.2* 16.4* 13.4*  HGB 7.6* 9.0* 8.8*  PLT 154 193 PLATELET CLUMPS NOTED ON SMEAR, COUNT APPEARS ADEQUATE  CREATININE 2.23* 2.52*  --    Estimated Creatinine Clearance: 18.5 ml/min (by C-G formula based on Cr of 2.52). No results found for this basename: VANCOTROUGH, Leodis Binet, VANCORANDOM, GENTTROUGH, GENTPEAK, GENTRANDOM, TOBRATROUGH, TOBRAPEAK, TOBRARND, AMIKACINPEAK, AMIKACINTROU, AMIKACIN,  in the last 72 hours   Microbiology: Recent Results (from the past 720 hour(s))  MRSA PCR SCREENING     Status: None   Collection Time    08/14/12  1:24 AM      Result Value Range Status   MRSA by PCR NEGATIVE  NEGATIVE Final   Comment:            The GeneXpert MRSA Assay (FDA     approved for NASAL specimens     only), is one component of a     comprehensive MRSA colonization     surveillance program. It is not     intended to  diagnose MRSA     infection nor to guide or     monitor treatment for     MRSA infections.  URINE CULTURE     Status: None   Collection Time    08/14/12  8:06 PM      Result Value Range Status   Specimen Description URINE, CLEAN CATCH   Final   Special Requests NONE   Final   Culture  Setup Time 08/15/2012 01:17   Final   Colony Count >=100,000 COLONIES/ML   Final   Culture     Final   Value: VANCOMYCIN RESISTANT ENTEROCOCCUS ISOLATED     Note: CRITICAL RESULT CALLED TO, READ BACK BY AND VERIFIED WITH: KATHY RICKER @ 0320 ON 08/19/2012   Report Status 08/19/2012 FINAL   Final   Organism ID, Bacteria VANCOMYCIN RESISTANT ENTEROCOCCUS ISOLATED   Final    Medical History: Past Medical History  Diagnosis Date  . Coronary artery disease     s/p CABG 2008 with multiple PCIs  . Ischemic cardiomyopathy     Cath 04/2012, significant calcifications  . History of nonadherence to medical treatment   . Systolic CHF     29/5621 echo EF 10%  . Esophageal varices   . Cirrhosis     hx of fatty liver per patient, has known ascites with repeated paracenteses at [facility] as  of 2013, also +hx of esoph varices with banding in the past  . Pacemaker   . ICD (implantable cardiac defibrillator) in place   . Hypertension     "used to have HTN; now I'm low" (05/23/2012)  . NSTEMI (non-ST elevated myocardial infarction)     04/2012; Trop peaked to 1.39; 05/23/2012 pt denies ever having MI  . Pneumonia 02/2012    "first time ever" (05/23/2012)  . Chronic bronchitis 1970's thru 2002    "went away when I stopped smoking in 2002" (05/23/2012)  . Diabetes mellitus     Diet controlled  . History of blood transfusion     "alot of them" (05/23/2012)  . Iron (Fe) deficiency anemia     "severe" (05/23/2012)  . Pancreatitis, chronic 10/04/2011    Had severe gallstone pancreatitis in May 2003, no surgery, treated medically then returned in June 2003 for cholecystectomy and cyst gastrostomy for pancreatic pseudocyst. In  March 2013 was admitted for hemorrhage into psueodcyst. In April 2013 was treated at [facility] for polymicrobial bacteremia (fungal, MRSA and enterobacter) felt to be due to ruptured pseudocyst, treated medically. Admitted Jan 2014 with abd pain and had gas and fluid in pancreatic bed. She refused surgery ("would not make it") and had fluid aspirated by IR- gram stain showed yeast and cultures grew enterococcus species, sensitive to amp and vanc. She was seen by ID and discharged on IV vanc/fortaz with HD and po voriconazole.     [name] ESRD on hemodialysis 05/06/2012    T,Th,Sat HD by [employer] [facility] on [address]. Started hemodialysis around July 2013.    [name] CHF (congestive heart failure)   . Complication of vascular access for dialysis 07/13/2012    L arm AVG (placed by Dr. Lyda Perone @ [facility]10/25) was ligated 1/6 after significant L arm swelling secondary to L SCV stenosis and pacemaker; new access to be placed at right arm on 1/29 by Dr. Hollace Hayward      Medications:  Scheduled:  . aspirin EC  81 mg Oral Daily  . chlorpheniramine-HYDROcodone  5 mL Oral Q12H  . [START ON 08/25/2012] darbepoetin (ARANESP) injection - DIALYSIS  100 mcg Intravenous Q Thu-HD  . docusate sodium  100 mg Oral BID  . enoxaparin (LOVENOX) injection  30 mg Subcutaneous Q24H  . feeding supplement (NEPRO CARB STEADY)  237 mL Oral BID WC  . [START ON 08/20/2012] ferric gluconate (FERRLECIT/NULECIT) IV  125 mg Intravenous Q T,Th,Sa-HD  . [COMPLETED]  HYDROmorphone (DILAUDID) injection  1 mg Intravenous Once  . [EXPIRED] iohexol  25 mL Oral Q1 Hr x 2  . methadone  10 mg Oral QID  . metoprolol tartrate  12.5 mg Oral BID  . multivitamin  1 tablet Oral QHS  . pantoprazole  40 mg Oral Daily  . sevelamer  400 mg Oral Q breakfast  . simvastatin  10 mg Oral q1800  . [START ON 08/20/2012] vancomycin  500 mg Intravenous Q T,Th,Sa-HD  . vancomycin  1,000 mg Intravenous Once  . voriconazole  4 mg/kg Intravenous Q12H  . [DISCONTINUED]  ceFEPime (MAXIPIME) IV  2 g Intravenous Once  . [DISCONTINUED] ceFEPime (MAXIPIME) IV  2 g Intravenous Q T,Th,Sa-HD  . [DISCONTINUED] darbepoetin (ARANESP) injection - DIALYSIS  100 mcg Intravenous Q Tue-HD  . [DISCONTINUED] levofloxacin  500 mg Oral Q48H   Assessment: 62 yo F known to pharmacy from previous Vancomcyin dosing.  Noted CT scan shows 2 areas of fluid collection concerning for pseudocyst/abscess on the abd  wall and pancreas.  Asked to change abx to Zosyn for better abd coverage.  Pt has ESRD with HD on TTS.  Goal of Therapy:  Eradication of infection  Plan:  Zosyn 2.25gm IV q8h.  [employer], Pharm.D., BCPS Clinical Pharmacist Pager [phone] 08/19/2012 4:36 PM

## 2012-08-19 NOTE — Progress Notes (Signed)
CT scan of abdomen and pelvis showed 2 new fluid and gas collection along the left lateral abdominal wall and the body of the pancreas. I will go ahead and brought her antibiotic coverage to Zosyn and voriconazole. Consult surgery for further management.

## 2012-08-19 NOTE — Consult Note (Signed)
Reason for Consult: pancreatitis with recurrent pancreatic abscess Referring Physician: Dutch Quint is an 62 y.o. female.  HPI: patient is a very pleasant 62 year old female with multiple medical problems as noted below. She has a history of chronic pancreatitis.  She had severe gallstone pancreatitis in May 2003, no surgery, treated medically then returned in June 2003 for cholecystectomy and cyst gastrostomy for pancreatic pseudocyst. In March 2013 was admitted for hemorrhage into psueodcyst. In April 2013 was treated at [facility] for polymicrobial bacteremia (fungal, MRSA and enterobacter) felt to be due to ruptured pseudocyst, treated medically. Admitted Jan 2014 with abd pain and had gas and fluid in pancreatic bed. She refused surgery ("would not make it") and had fluid aspirated by IR- gram stain showed yeast and cultures grew enterococcus species, sensitive to amp and vanc. She was seen by ID and discharged on IV vanc/fortaz with HD and po voriconazole. I believe these were discontinued prior to her current admission. The patient was admitted several days ago with complaints a shortness of breath and cough and malaise but also was having some left-sided abdominal pain for several days. This pain has worsened somewhat while she is in the hospital. She then had a CT scan revealing 2 apparent abscesses near the tail of the pancreas as detailed below. There is one near the hilum of the spleen that is quite small about 1.5 cm and appears to be just developing and there was a more discrete abscess about 3 cm which is actually just underneath the abdominal wall. She has not had any other apparent GI symptoms and states she is unable to be okay. She is felt somewhat feverish on occasion.   Past Medical History  Diagnosis Date  . Coronary artery disease     s/p CABG 2008 with multiple PCIs  . Ischemic cardiomyopathy     Cath 04/2012, significant calcifications  . History of nonadherence to medical  treatment   . Systolic CHF     09/5407 echo EF 10%  . Esophageal varices   . Cirrhosis     hx of fatty liver per patient, has known ascites with repeated paracenteses at [facility] as of 2013, also +hx of esoph varices with banding in the past  . Pacemaker   . ICD (implantable cardiac defibrillator) in place   . Hypertension     "used to have HTN; now I'm low" (05/23/2012)  . NSTEMI (non-ST elevated myocardial infarction)     04/2012; Trop peaked to 1.39; 05/23/2012 pt denies ever having MI  . Pneumonia 02/2012    "first time ever" (05/23/2012)  . Chronic bronchitis 1970's thru 2002    "went away when I stopped smoking in 2002" (05/23/2012)  . Diabetes mellitus     Diet controlled  . History of blood transfusion     "alot of them" (05/23/2012)  . Iron (Fe) deficiency anemia     "severe" (05/23/2012)  . Pancreatitis, chronic 10/04/2011    Had severe gallstone pancreatitis in May 2003, no surgery, treated medically then returned in June 2003 for cholecystectomy and cyst gastrostomy for pancreatic pseudocyst. In March 2013 was admitted for hemorrhage into psueodcyst. In April 2013 was treated at [facility] for polymicrobial bacteremia (fungal, MRSA and enterobacter) felt to be due to ruptured pseudocyst, treated medically. Admitted Jan 2014 with abd pain and had gas and fluid in pancreatic bed. She refused surgery ("would not make it") and had fluid aspirated by IR- gram stain showed yeast and cultures grew enterococcus  species, sensitive to amp and vanc. She was seen by ID and discharged on IV vanc/fortaz with HD and po voriconazole.     [name] ESRD on hemodialysis 05/06/2012    T,Th,Sat HD by [facility] on [address]. Started hemodialysis around July 2013.    [name] CHF (congestive heart failure)   . Complication of vascular access for dialysis 07/13/2012    L arm AVG (placed by Dr. Lyda Perone @ [facility]) was ligated 1/6 after significant L arm swelling secondary to L SCV stenosis and pacemaker; new  access to be placed at right arm on 1/29 by Dr. Hollace Hayward      Past Surgical History  Procedure Laterality Date  . Esophagogastroduodenoscopy  09/15/2011    Procedure: ESOPHAGOGASTRODUODENOSCOPY (EGD);  Surgeon: Theda Belfast, MD;  Location: [facility] ENDOSCOPY;  Service: Endoscopy;  Laterality: N/A;  . Cholecystectomy  2003  . Tonsillectomy and adenoidectomy  1961  . Appendectomy  1973?  [name] Vaginal hysterectomy  1973?  [name] Tubal ligation  1972  . Dilation and curettage of uterus      "bunch from profuse bleeding in the 1970's" (05/23/2012)  . Coronary artery bypass graft  2008    CABG X4  . Coronary angioplasty with stent placement  2008    "1; day after CABG" (05/23/2012)  . Cardiac catheterization      "before 2008 and 3 wk ago" (05/23/2012)  . Insert / replace / remove pacemaker  06/2011    pacemaker ICD  . Cardiac defibrillator placement  06/2011  . Arteriovenous graft placement  03/2012    right antecub  . Insertion of dialysis catheter  ~ 10/2011    right chest  . Peritoneal catheter insertion  08/2011  . Peritoneal catheter removal  ~ 10/2011    Family History  Problem Relation Age of Onset  . Coronary artery disease Mother   . Hypertension Mother   . Diabetes Mother   . Diabetes Sister   . Anesthesia problems Neg Hx     Social History:  reports that she quit smoking about 12 years ago. Her smoking use included Cigarettes. She has a 10.23 pack-year smoking history. She has never used smokeless tobacco. She reports that she does not drink alcohol or use illicit drugs.  Allergies:  Allergies  Allergen Reactions  . Codeine Itching  . Morphine And Related Shortness Of Breath    SOB when given IV once "to me it was mild; I called out fast for help; never had to intubate" (05/23/2012)  . Ace Inhibitors Other (See Comments)    Unknown reaction but her physician stated that she can't take it (specifically lisinopril)  . Tylenol (Acetaminophen) Other (See Comments)    Patient states that  the doctor told her she has a Fatty Liver.    Medications:  Prior to Admission:  Prescriptions prior to admission  Medication Sig Dispense Refill  . aspirin EC 81 MG EC tablet Take 1 tablet (81 mg total) by mouth daily.  30 tablet  0  . HYDROmorphone (DILAUDID) 2 MG tablet Take 1 tablet (2 mg total) by mouth every 6 (six) hours as needed for pain. For pain  30 tablet  0  . methadone (DOLOPHINE) 10 MG tablet Take 1 tablet (10 mg total) by mouth 4 (four) times daily.  120 tablet  0  . multivitamin (RENA-VIT) TABS tablet Take 1 tablet by mouth daily.      . Nutritional Supplements (FEEDING SUPPLEMENT, NEPRO CARB STEADY,) LIQD Take 237  mLs by mouth 2 (two) times daily with breakfast and lunch.      . pantoprazole (PROTONIX) 40 MG tablet Take 40 mg by mouth daily.      . pravastatin (PRAVACHOL) 20 MG tablet Take 20 mg by mouth at bedtime.       . ranolazine (RANEXA) 500 MG 12 hr tablet Take 500 mg by mouth daily.       . sevelamer (RENAGEL) 400 MG tablet Take 400 mg by mouth daily. With a meal        Results for orders placed during the hospital encounter of 08/13/12 (from the past 48 hour(s))  CBC     Status: Abnormal   Collection Time    08/18/12  1:44 PM      Result Value Range   WBC 16.4 (*) 4.0 - 10.5 K/uL   RBC 3.09 (*) 3.87 - 5.11 MIL/uL   Hemoglobin 9.0 (*) 12.0 - 15.0 g/dL   HCT 16.1 (*) 09.6 - 04.5 %   MCV 90.9  78.0 - 100.0 fL   MCH 29.1  26.0 - 34.0 pg   MCHC 32.0  30.0 - 36.0 g/dL   RDW 40.9 (*) 81.1 - 91.4 %   Platelets 193  150 - 400 K/uL  RENAL FUNCTION PANEL     Status: Abnormal   Collection Time    08/18/12  1:44 PM      Result Value Range   Sodium 131 (*) 135 - 145 mEq/L   Potassium 3.9  3.5 - 5.1 mEq/L   Comment: DELTA CHECK NOTED   Chloride 94 (*) 96 - 112 mEq/L   CO2 27  19 - 32 mEq/L   Glucose, Bld 221 (*) 70 - 99 mg/dL   BUN 37 (*) 6 - 23 mg/dL   Creatinine, Ser 7.82 (*) 0.50 - 1.10 mg/dL   Calcium 95.6  8.4 - 21.3 mg/dL   Phosphorus 3.4  2.3 - 4.6  mg/dL   Albumin 1.6 (*) 3.5 - 5.2 g/dL   GFR calc non Af Amer 19 (*) >90 mL/min   GFR calc Af Amer 22 (*) >90 mL/min   Comment:            The eGFR has been calculated     using the CKD EPI equation.     This calculation has not been     validated in all clinical     situations.     eGFR's persistently     <90 mL/min signify     possible Chronic Kidney Disease.  CBC     Status: Abnormal   Collection Time    08/19/12 10:21 AM      Result Value Range   WBC 13.4 (*) 4.0 - 10.5 K/uL   Comment: WHITE COUNT CONFIRMED ON SMEAR   RBC 3.07 (*) 3.87 - 5.11 MIL/uL   Hemoglobin 8.8 (*) 12.0 - 15.0 g/dL   HCT 08.6 (*) 57.8 - 46.9 %   MCV 94.1  78.0 - 100.0 fL   MCH 28.7  26.0 - 34.0 pg   MCHC 30.4  30.0 - 36.0 g/dL   RDW 62.9 (*) 52.8 - 41.3 %   Platelets    150 - 400 K/uL   Value: PLATELET CLUMPS NOTED ON SMEAR, COUNT APPEARS ADEQUATE    Dg Chest 2 View  08/19/2012  *RADIOLOGY REPORT*  Clinical Data: Productive cough with shortness of breath  CHEST - 2 VIEW  Comparison: 08/18/2012  Findings: Right internal jugular dialysis catheter  is stable in position. A three lead pacer is in place via a left subclavian approach with lead tips stable and lines appearing intact. The patient is status post median sternotomy and CABG  An interval improvement in lung volumes is noted in comparison with the previous exam.  A stable degree of cardiomegaly is identified. Pulmonary vascular congestion with mild interstitial edema is again noted.  There has been continued improvement in aeration at the right lung base but residual atelectasis is identified as evidenced by posterior displacement of the right major fissure.  No new areas of focal infiltrate or atelectasis are identified. Bony structures remain intact.  IMPRESSION: Persistent cardiomegaly with mild interstitial edema.  Improved right basilar aeration.   Original Report Authenticated By: Rhodia Albright, M.D.    Dg Chest 2 View  08/18/2012  *RADIOLOGY  REPORT*  Clinical Data: Follow-up pleural effusions.  CHEST - 2 VIEW  Comparison: 08/14/2012.  Findings: Right IJ dialysis catheter tip projects over the SVC/RA junction or high right atrium.  Left subclavian pacemaker and AICD lead tips are stable in position.  Heart is enlarged, unchanged. Thoracic aorta is calcified.  Lungs are low in volume with mild diffuse bilateral air space disease.  Right lung aeration may have improved minimally in the interval.  Small bilateral pleural effusions, right greater than left.  IMPRESSION: Congestive heart failure with possible minimal interval improvement in right lung aeration.   Original Report Authenticated By: Leanna Battles, M.D.    Ct Abdomen Pelvis W Contrast  08/19/2012  *RADIOLOGY REPORT*  Clinical Data: Abdominal distension, left lower quadrant tenderness  CT ABDOMEN AND PELVIS WITH CONTRAST  Technique:  Multidetector CT imaging of the abdomen and pelvis was performed following the standard protocol during bolus administration of intravenous contrast.  Contrast:  80 ml Omnipaque-300 IV  Comparison: 08/14/2012  Findings: Patchy right lower lobe opacity, atelectasis versus pneumonia.  Mild associated small right pleural effusion.  Mild patchy left basilar opacity, likely atelectasis, with trace left pleural effusion.  Cardiomegaly.  Heterogeneous perfusion of the liver and spleen.  Portal vein is not visualized, possibly related to phase of enhancement, thrombosis not excluded.  Pancreatic atrophy with fluid/scattered foci of gas along the pancreatic tail and splenic hilum.  3.3 x 1.9 cm fluid and gas collection along the left lateral abdominal wall (series 2/image 21), suspicious for complex pseudocyst/abscess, mildly increased (previously 2.0 x 1.0 cm).  1.5 x 2.7 cm fluid and gas collection anterior to the proximal pancreatic body (series 2/image 19), new, suspicious for developing complex pseudocyst/abscess.  Adrenal glands are unremarkable.  Status post  cholecystectomy.  Kidneys are notable for renal atrophy and bilateral renal cysts.  No evidence of bowel obstruction.  Large volume abdominopelvic ascites.  Mild peritoneal thickening in the right paracolic gutter (series 2/image 28), possibly reflecting peritonitis.  Pelvic organs are poorly visualized due to ascites and poor soft tissues contrast.  Body wall edema / anasarca.  Degenerative changes of the visualized thoracolumbar spine.  IMPRESSION: Pancreatic atrophy with fluid/scattered foci of gas along the pancreatic tail and splenic hilum.  3.3 x 1.9 cm fluid and gas collection along the left lateral abdominal wall, increased, suspicious for a complex pseudocyst/abscess.  1.5 x 2.7 cm fluid and gas collection anterior to the pancreatic body, new, worrisome for developing complex pseudocyst/abscess.  Large volume abdominopelvic ascites.  Mild peritoneal thickening in the right paracolic gutter, possibly reflecting peritonitis.  Patchy right lower lobe opacity, atelectasis versus pneumonia, with associated small right pleural effusion.  Original Report Authenticated By: Charline Bills, M.D.     Review of Systems  Constitutional: Positive for fever and malaise/fatigue.  Respiratory: Positive for cough, sputum production and shortness of breath.   Cardiovascular: Positive for leg swelling. Negative for chest pain.  Gastrointestinal: Positive for abdominal pain. Negative for nausea and vomiting.  Neurological: Positive for weakness.   Blood pressure 104/77, pulse 93, temperature 98.5 F (36.9 C), temperature source Oral, resp. rate 18, height 5\' 4"  (1.626 m), weight 113 lb 1.5 oz (51.3 kg), SpO2 100.00%. Physical Exam General: Thin frail African American female who appears older than her stated age Skin: Warm and dry without rash or infection HEENT: No palpable masses or thyromegaly. Sclera nonicteric. Lymph nodes: No cervical, supraclavicular or inguinal lymph nodes palpable Lungs: No increased  work of breathing. There are crackles at the left base. Cardiac: Regular rate and rhythm. 2+ ankle edema. Abdomen: Well-healed incisions. There is a very small epigastric incisional hernia area and there is moderate tenderness along the left lateral mid abdomen. Mild distention consistent with ascites. No masses palpable. Extremities: Thin with 2+ lower extremity edema Neurologic: She is alert and pleasant and oriented. No focal motor deficits.   Assessment/Plan:  Patient with multiple significant medical problems and chronic pancreatitis with recurring peripancreatic infected fluid collections. She currently has a very small developing collection in the splenic hilum and a larger collection that is just beneath the abdominal wall and appears to be very amenable to percutaneous drainage. She would be very high risk for any sort of surgical intervention and she is aware of this and essentially refuses any surgery. I would not recommend this anyway. I will recommend percutaneous drainage of a larger collection beneath the abdominal wall. The other collection of small enough that it may well be treatable with antibiotics. We will follow   Khristin Keleher T 08/19/2012, 5:13 PM

## 2012-08-19 NOTE — Progress Notes (Signed)
ANTIBIOTIC CONSULT NOTE - FOLLOW UP  Pharmacy Consult for Vancomycin + Cefepime Indication: R/o pneumonia  Allergies  Allergen Reactions  . Codeine Itching  . Morphine And Related Shortness Of Breath    SOB when given IV once "to me it was mild; I called out fast for help; never had to intubate" (05/23/2012)  . Ace Inhibitors Other (See Comments)    Unknown reaction but her physician stated that she can't take it (specifically lisinopril)  . Tylenol (Acetaminophen) Other (See Comments)    Patient states that the doctor told her she has a Fatty Liver.    Patient Measurements: Height: 5\' 4"  (162.6 cm) Weight: 113 lb 1.5 oz (51.3 kg) IBW/kg (Calculated) : 54.7  Vital Signs: Temp: 100.1 F (37.8 C) (03/06 2034) Temp src: Oral (03/06 2034) BP: 91/60 mmHg (03/06 2034) Pulse Rate: 108 (03/06 2034) Intake/Output from previous day: 03/06 0701 - 03/07 0700 In: 360 [P.O.:360] Out: 2981 [Stool:1] Intake/Output from this shift:    Labs:  Recent Labs  08/17/12 1400 08/18/12 1344  WBC 11.2* 16.4*  HGB 7.6* 9.0*  PLT 154 193  CREATININE 2.23* 2.52*   Estimated Creatinine Clearance: 18.5 ml/min (by C-G formula based on Cr of 2.52). No results found for this basename: VANCOTROUGH, Leodis Binet, VANCORANDOM, GENTTROUGH, GENTPEAK, GENTRANDOM, TOBRATROUGH, TOBRAPEAK, TOBRARND, AMIKACINPEAK, AMIKACINTROU, AMIKACIN,  in the last 72 hours   Microbiology: Recent Results (from the past 720 hour(s))  MRSA PCR SCREENING     Status: None   Collection Time    08/14/12  1:24 AM      Result Value Range Status   MRSA by PCR NEGATIVE  NEGATIVE Final   Comment:            The GeneXpert MRSA Assay (FDA     approved for NASAL specimens     only), is one component of a     comprehensive MRSA colonization     surveillance program. It is not     intended to diagnose MRSA     infection nor to guide or     monitor treatment for     MRSA infections.  URINE CULTURE     Status: None   Collection  Time    08/14/12  8:06 PM      Result Value Range Status   Specimen Description URINE, CLEAN CATCH   Final   Special Requests NONE   Final   Culture  Setup Time 08/15/2012 01:17   Final   Colony Count >=100,000 COLONIES/ML   Final   Culture     Final   Value: VANCOMYCIN RESISTANT ENTEROCOCCUS ISOLATED     Note: CRITICAL RESULT CALLED TO, READ BACK BY AND VERIFIED WITH: KATHY RICKER @ 0320 ON 08/19/2012   Report Status 08/19/2012 FINAL   Final   Organism ID, Bacteria VANCOMYCIN RESISTANT ENTEROCOCCUS ISOLATED   Final    Anti-infectives   Start     Dose/Rate Route Frequency Ordered Stop   08/19/12 1400  levofloxacin (LEVAQUIN) tablet 500 mg     500 mg Oral Every 48 hours 08/17/12 1233     08/17/12 1400  levofloxacin (LEVAQUIN) tablet 750 mg     750 mg Oral  Once 08/17/12 1233 08/17/12 2218   08/17/12 1230  levofloxacin (LEVAQUIN) tablet 750 mg  Status:  Discontinued     750 mg Oral Every 48 hours 08/17/12 1229 08/17/12 1232   08/16/12 1200  vancomycin (VANCOCIN) 500 mg in sodium chloride 0.9 % 100 mL IVPB  Status:  Discontinued     500 mg 100 mL/hr over 60 Minutes Intravenous Every T-Th-Sa (Hemodialysis) 08/14/12 0128 08/18/12 0848   08/14/12 2030  piperacillin-tazobactam (ZOSYN) IVPB 2.25 g  Status:  Discontinued     2.25 g 100 mL/hr over 30 Minutes Intravenous Every 8 hours 08/14/12 2010 08/18/12 0848   08/14/12 0800  piperacillin-tazobactam (ZOSYN) IVPB 2.25 g  Status:  Discontinued     2.25 g 100 mL/hr over 30 Minutes Intravenous Every 8 hours 08/14/12 0128 08/14/12 2010   08/14/12 0130  vancomycin (VANCOCIN) 1,250 mg in sodium chloride 0.9 % 250 mL IVPB     1,250 mg 166.7 mL/hr over 90 Minutes Intravenous  Once 08/14/12 0128 08/14/12 0359   08/14/12 0130  piperacillin-tazobactam (ZOSYN) IVPB 2.25 g     2.25 g 100 mL/hr over 30 Minutes Intravenous  Once 08/14/12 0128 08/14/12 0259      Assessment: 62 yo female known to pharmacy from antibiotic management. Pharmacy now  consulted to manage vancomycin and cefepime.   Goal of Therapy:  Pre-HD Vancomycin level of 15-25 mcg/ml  Plan:  1. Cefepime 2gm IV x 1, then 2gm IV Q-HD (Tu/Th/Sat) 2. Vancomycin 1gm IV x 1, then 500mg  IV Q-HD (Tu/Th/Sat)  Lorre Munroe, PharmD  08/19/2012 7:32 AM

## 2012-08-19 NOTE — Progress Notes (Addendum)
[facility] PROGRESS NOTE  Assessment/Plan: *SOB due to HCAP, worsening leukocytosis and fevers: -  Develop fevers and worsen leukocytosis. - start vanc. and cefepime, cont levaquin. Ct abdomen and pelvis with contrast.- Has LLQ pain.  - still with  + JVD, crackles at bases lower extremity edema. - still complain of cough.   Chronic systolic congestive heart failure, NYHA class 3 - decompensated - Received an additional HD treatment  3.6.2014. - start metoprolol. mildly tachycardic. - repeat CXR, evaluate HF.    Hyponatremia - resolved with HD    ESRD on hemodialysis - HD TTS. - Received an additional HD treatment  3.6.2014.    Anemia in chronic kidney disease (CKD) - Hemoglobin 7.2 on admission. The patient has received 2 units of PRBCs 08/14/2012.  -  Hemoglobin 10.4 and 9.6    Pancreatitis, chronic - stable.     Hypertension - BP stable.  Code Status: full code  Family Communication: no family at bedside  Disposition Plan: to SNF when stable     Consultants:  nephrology  Procedures: Echo 3.5.2014: The estimated ejection fraction was in the range of 20% to 25%, Peak RV-RA gradient: 41mm Hg (S).      Antibiotics:  vanc zosyn 3.4.2014- 3.5.2014  levaquin 3.5.2014  HPI/Subjective Complaining of cough, specially when laying flat Tolerating diet, complaining of LLQ, no diarhea  Objective: Filed Vitals:   08/18/12 1730 08/18/12 1800 08/18/12 1839 08/18/12 2034  BP: 116/78 120/75 111/76 91/60  Pulse: 113 104 108 108  Temp:  98.7 F (37.1 C) 98.7 F (37.1 C) 100.1 F (37.8 C)  TempSrc:  Oral Oral Oral  Resp: 22 26 24 20   Height:      Weight:  50.2 kg (110 lb 10.7 oz)  51.3 kg (113 lb 1.5 oz)  SpO2:  96%  94%    Intake/Output Summary (Last 24 hours) at 08/19/12 0947 Last data filed at 08/18/12 1800  Gross per 24 hour  Intake    120 ml  Output   2980 ml  Net  -2860 ml   Filed Weights   08/18/12 1355 08/18/12 1800 08/18/12 2034   Weight: 53.6 kg (118 lb 2.7 oz) 50.2 kg (110 lb 10.7 oz) 51.3 kg (113 lb 1.5 oz)    Exam:  General: Alert, awake, oriented x3, in no acute distress.  HEENT: No bruits, no goiter. + JVD Heart: Regular rate and rhythm, without murmurs, rubs, gallops.  Lungs: Good air movement, crackles bilaterally. Abdomen: Soft, LLQ tender no rebound, +BS. EXT: lower extremity edema.   Data Reviewed: Basic Metabolic Panel:  Recent Labs Lab 08/14/12 0500 08/15/12 0532 08/16/12 0500 08/17/12 1400 08/18/12 1344  NA 132* 132* 133* 129* 131*  K 4.6 4.2 3.8 3.1* 3.9  CL 94* 96 95* 77* 94*  CO2 27 23 25 21 27   GLUCOSE 65* 92 182* 475* 221*  BUN 81* 40* 56* 33* 37*  CREATININE 4.94* 2.95* 3.49* 2.23* 2.52*  CALCIUM 9.9 9.6 10.2 8.1* 10.5  PHOS  --   --  4.5  --  3.4   Liver Function Tests:  Recent Labs Lab 08/13/12 2142 08/16/12 0500 08/18/12 1344  AST 20  --   --   ALT 11  --   --   ALKPHOS 172*  --   --   BILITOT 0.3  --   --   PROT 7.0  --   --   ALBUMIN 1.7* 1.7* 1.6*    Recent Labs Lab 08/13/12 2142  LIPASE 24   No results found for this basename: AMMONIA,  in the last 168 hours CBC:  Recent Labs Lab 08/13/12 2142 08/14/12 0500 08/15/12 0532 08/16/12 0500 08/17/12 1400 08/18/12 1344  WBC 18.9* 13.7* 11.1* 14.1* 11.2* 16.4*  NEUTROABS 16.2*  --   --   --   --   --   HGB 7.8* 7.2* 10.4* 9.6* 7.6* 9.0*  HCT 24.7* 22.3* 33.4* 29.8* 24.0* 28.1*  MCV 91.1 92.1 91.0 90.6 92.0 90.9  PLT 286 224 223 236 154 193   Cardiac Enzymes: No results found for this basename: CKTOTAL, CKMB, CKMBINDEX, TROPONINI,  in the last 168 hours BNP (last 3 results)  Recent Labs  04/21/12 1419 05/08/12 0515 06/02/12 1944  PROBNP >70000.0* >70000.0* >70000.0*   CBG: No results found for this basename: GLUCAP,  in the last 168 hours  Recent Results (from the past 240 hour(s))  MRSA PCR SCREENING     Status: None   Collection Time    08/14/12  1:24 AM      Result Value Range  Status   MRSA by PCR NEGATIVE  NEGATIVE Final   Comment:            The GeneXpert MRSA Assay (FDA     approved for NASAL specimens     only), is one component of a     comprehensive MRSA colonization     surveillance program. It is not     intended to diagnose MRSA     infection nor to guide or     monitor treatment for     MRSA infections.  URINE CULTURE     Status: None   Collection Time    08/14/12  8:06 PM      Result Value Range Status   Specimen Description URINE, CLEAN CATCH   Final   Special Requests NONE   Final   Culture  Setup Time 08/15/2012 01:17   Final   Colony Count >=100,000 COLONIES/ML   Final   Culture     Final   Value: VANCOMYCIN RESISTANT ENTEROCOCCUS ISOLATED     Note: CRITICAL RESULT CALLED TO, READ BACK BY AND VERIFIED WITH: KATHY RICKER @ 0320 ON 08/19/2012   Report Status 08/19/2012 FINAL   Final   Organism ID, Bacteria VANCOMYCIN RESISTANT ENTEROCOCCUS ISOLATED   Final     Studies: Dg Chest 2 View  08/18/2012  *RADIOLOGY REPORT*  Clinical Data: Follow-up pleural effusions.  CHEST - 2 VIEW  Comparison: 08/14/2012.  Findings: Right IJ dialysis catheter tip projects over the SVC/RA junction or high right atrium.  Left subclavian pacemaker and AICD lead tips are stable in position.  Heart is enlarged, unchanged. Thoracic aorta is calcified.  Lungs are low in volume with mild diffuse bilateral air space disease.  Right lung aeration may have improved minimally in the interval.  Small bilateral pleural effusions, right greater than left.  IMPRESSION: Congestive heart failure with possible minimal interval improvement in right lung aeration.   Original Report Authenticated By: Leanna Battles, M.D.     Scheduled Meds: . aspirin EC  81 mg Oral Daily  . ceFEPime (MAXIPIME) IV  2 g Intravenous Once  . [START ON 08/20/2012] ceFEPime (MAXIPIME) IV  2 g Intravenous Q T,Th,Sa-HD  . chlorpheniramine-HYDROcodone  5 mL Oral Q12H  . [START ON 08/23/2012] darbepoetin  (ARANESP) injection - DIALYSIS  100 mcg Intravenous Q Tue-HD  . docusate sodium  100 mg Oral BID  . enoxaparin (LOVENOX) injection  30 mg Subcutaneous Q24H  . feeding supplement (NEPRO CARB STEADY)  237 mL Oral BID WC  . levofloxacin  500 mg Oral Q48H  . methadone  10 mg Oral QID  . metoprolol tartrate  12.5 mg Oral BID  . multivitamin  1 tablet Oral QHS  . pantoprazole  40 mg Oral Daily  . sevelamer  400 mg Oral Q breakfast  . simvastatin  10 mg Oral q1800  . [START ON 08/20/2012] vancomycin  500 mg Intravenous Q T,Th,Sa-HD  . vancomycin  1,000 mg Intravenous Once   Continuous Infusions:    Marinda Elk  [facility] Pager [phone].  If 8PM-8AM, please contact night-coverage at [email or web address], password [facility] 08/19/2012, 9:47 AM  LOS: 6 days

## 2012-08-19 NOTE — Progress Notes (Signed)
[facility] Progress Note  Subjective:    LLQ abdominal pain worsening.  Wants to go to IR for paracentesis. Doing well with mobility and does not wish to return to SNF after discharge.  Objective Filed Vitals:   08/18/12 1800 08/18/12 1839 08/18/12 2034 08/19/12 1021  BP: 120/75 111/76 91/60 118/77  Pulse: 104 108 108 92  Temp: 98.7 F (37.1 C) 98.7 F (37.1 C) 100.1 F (37.8 C) 97.7 F (36.5 C)  TempSrc: Oral Oral Oral Oral  Resp: 26 24 20    Height:      Weight: 50.2 kg (110 lb 10.7 oz)  51.3 kg (113 lb 1.5 oz)   SpO2: 96%  94% 99%   Physical Exam General: Sitting up in chair, chronically ill-appearing, NAD Heart: RRR. + JVD, AICD left chest Lungs: Diffuse crackles. + cough Abdomen: Soft, + distension, tender to palp LLQ, normal BS Extremities: 2+ LE edema Dialysis Access: Rt I-J  Dialysis Orders: Center: [facility] on TTS.  EDW 53.5 kg HD Bath 3K/2.5Ca Time 4hrs Heparin 1600 U. Access Right IJ catheter BFR 400 DFR 800 Hectorol 0 mcg IV/HD Epogen 1500 Units IV/HD Venofer 0.  Assessment/Plan: 1. SOB/presumed HCAP/right pleural effusion; WBC improving on Vanc and zoysn.; Had extra HD treatment Wed. Post tx CXR with minimal improvement in right lung aeration.  2. LLQ abdominal pain  - CT abdomen/pelvis pending 3. ESRD - TTS HD; chronic hyponatremia secondary to cirrhosis. K+ 3.9 - Continue 4K+ bath for now.  4. Anemia - Hgb 8.8 s/p 2 units on 3/2; Monitor Hgb and transfuse prn. Aranesp 100. Tsat 22% and Ferritin 1513 on 2/20. Add IV fe 5. Secondary hyperparathyroidism - P controlled; no hectorol; corrected Ca remains elevated; Low Ca bath. 6. HTN/volume/ascities - SBPs 90s to 120 on metoprolol 12.5 BID.  Post wgt 51.3. Under EDW but still with significant LE edema and JVD. Try for UF goal of 3L tomorrow.  7. Nutrition - Albumin 1.6. (has PC) Currently on renal diet + nepro - eating small amounts. (does not like the food) Consider liberalizing  8. Chronic pancreatitis -  stable   Scot Jun. [facility] Pager [phone] 08/19/2012,12:25 PM  LOS: 6 days    Additional Objective Labs: Basic Metabolic Panel:  Recent Labs Lab 08/16/12 0500 08/17/12 1400 08/18/12 1344  NA 133* 129* 131*  K 3.8 3.1* 3.9  CL 95* 77* 94*  CO2 25 21 27   GLUCOSE 182* 475* 221*  BUN 56* 33* 37*  CREATININE 3.49* 2.23* 2.52*  CALCIUM 10.2 8.1* 10.5  PHOS 4.5  --  3.4   Liver Function Tests:  Recent Labs Lab 08/13/12 2142 08/16/12 0500 08/18/12 1344  AST 20  --   --   ALT 11  --   --   ALKPHOS 172*  --   --   BILITOT 0.3  --   --   PROT 7.0  --   --   ALBUMIN 1.7* 1.7* 1.6*    Recent Labs Lab 08/13/12 2142  LIPASE 24   CBC:  Recent Labs Lab 08/13/12 2142  08/15/12 0532 08/16/12 0500 08/17/12 1400 08/18/12 1344 08/19/12 1021  WBC 18.9*  < > 11.1* 14.1* 11.2* 16.4* 13.4*  NEUTROABS 16.2*  --   --   --   --   --   --   HGB 7.8*  < > 10.4* 9.6* 7.6* 9.0* 8.8*  HCT 24.7*  < > 33.4* 29.8* 24.0* 28.1* 28.9*  MCV 91.1  < >  91.0 90.6 92.0 90.9 94.1  PLT 286  < > 223 236 154 193 PLATELET CLUMPS NOTED ON SMEAR, COUNT APPEARS ADEQUATE  < > = values in this interval not displayed. Blood Culture    Component Value Date/Time   SDES URINE, CLEAN CATCH 08/14/2012 2006   SPECREQUEST NONE 08/14/2012 2006   CULT  Value: VANCOMYCIN RESISTANT ENTEROCOCCUS ISOLATED Note: CRITICAL RESULT CALLED TO, READ BACK BY AND VERIFIED WITH: KATHY RICKER @ 0320 ON 08/19/2012 08/14/2012 2006   REPTSTATUS 08/19/2012 FINAL 08/14/2012 2006    Studies/Results: Dg Chest 2 View  08/18/2012  *RADIOLOGY REPORT*  Clinical Data: Follow-up pleural effusions.  CHEST - 2 VIEW  Comparison: 08/14/2012.  Findings: Right IJ dialysis catheter tip projects over the SVC/RA junction or high right atrium.  Left subclavian pacemaker and AICD lead tips are stable in position.  Heart is enlarged, unchanged. Thoracic aorta is calcified.  Lungs are low in volume with mild diffuse  bilateral air space disease.  Right lung aeration may have improved minimally in the interval.  Small bilateral pleural effusions, right greater than left.  IMPRESSION: Congestive heart failure with possible minimal interval improvement in right lung aeration.   Original Report Authenticated By: Leanna Battles, M.D.    Medications:   . aspirin EC  81 mg Oral Daily  . ceFEPime (MAXIPIME) IV  2 g Intravenous Once  . [START ON 08/20/2012] ceFEPime (MAXIPIME) IV  2 g Intravenous Q T,Th,Sa-HD  . chlorpheniramine-HYDROcodone  5 mL Oral Q12H  . [START ON 08/25/2012] darbepoetin (ARANESP) injection - DIALYSIS  100 mcg Intravenous Q Thu-HD  . docusate sodium  100 mg Oral BID  . enoxaparin (LOVENOX) injection  30 mg Subcutaneous Q24H  . feeding supplement (NEPRO CARB STEADY)  237 mL Oral BID WC  . iohexol  25 mL Oral Q1 Hr x 2  . levofloxacin  500 mg Oral Q48H  . methadone  10 mg Oral QID  . metoprolol tartrate  12.5 mg Oral BID  . multivitamin  1 tablet Oral QHS  . pantoprazole  40 mg Oral Daily  . sevelamer  400 mg Oral Q breakfast  . simvastatin  10 mg Oral q1800  . [START ON 08/20/2012] vancomycin  500 mg Intravenous Q T,Th,Sa-HD  . vancomycin  1,000 mg Intravenous Once   I have seen and examined this patient and agree with plan as outlined by K. Broadus John, PA-C.  Unclear etiology of abd pain and ongoing SOB.  W/u per primary svc.  Will cont with low Ca bath and noncalcium based binders.  Will cont to follow. COLADONATO,JOSEPH A,MD 08/19/2012 1:52 PM

## 2012-08-20 ENCOUNTER — Encounter (HOSPITAL_COMMUNITY): Payer: Self-pay | Admitting: Radiology

## 2012-08-20 ENCOUNTER — Inpatient Hospital Stay (HOSPITAL_COMMUNITY): Payer: Medicare Other

## 2012-08-20 DIAGNOSIS — I2589 Other forms of chronic ischemic heart disease: Secondary | ICD-10-CM

## 2012-08-20 DIAGNOSIS — R188 Other ascites: Secondary | ICD-10-CM

## 2012-08-20 DIAGNOSIS — K651 Peritoneal abscess: Secondary | ICD-10-CM

## 2012-08-20 DIAGNOSIS — IMO0002 Reserved for concepts with insufficient information to code with codable children: Secondary | ICD-10-CM | POA: Diagnosis present

## 2012-08-20 LAB — CBC
HCT: 27 % — ABNORMAL LOW (ref 36.0–46.0)
Hemoglobin: 8.4 g/dL — ABNORMAL LOW (ref 12.0–15.0)
MCHC: 31.1 g/dL (ref 30.0–36.0)
RBC: 2.88 MIL/uL — ABNORMAL LOW (ref 3.87–5.11)

## 2012-08-20 LAB — RENAL FUNCTION PANEL
CO2: 29 mEq/L (ref 19–32)
Calcium: 11.2 mg/dL — ABNORMAL HIGH (ref 8.4–10.5)
Chloride: 93 mEq/L — ABNORMAL LOW (ref 96–112)
GFR calc Af Amer: 23 mL/min — ABNORMAL LOW (ref >90)
GFR calc non Af Amer: 20 mL/min — ABNORMAL LOW (ref >90)
Glucose, Bld: 58 mg/dL — ABNORMAL LOW (ref 70–99)
Potassium: 3.8 mEq/L (ref 3.5–5.1)
Sodium: 129 mEq/L — ABNORMAL LOW (ref 135–145)

## 2012-08-20 LAB — GLUCOSE, CAPILLARY
Glucose-Capillary: 120 mg/dL — ABNORMAL HIGH (ref 70–99)
Glucose-Capillary: 211 mg/dL — ABNORMAL HIGH (ref 70–99)

## 2012-08-20 MED ORDER — HYDROMORPHONE HCL 2 MG PO TABS
ORAL_TABLET | ORAL | Status: AC
  Filled 2012-08-20: qty 1

## 2012-08-20 MED ORDER — DEXTROSE 50 % IV SOLN
25.0000 mL | Freq: Once | INTRAVENOUS | Status: AC | PRN
  Administered 2012-08-20: 25 mL via INTRAVENOUS

## 2012-08-20 MED ORDER — FENTANYL CITRATE 0.05 MG/ML IJ SOLN
INTRAMUSCULAR | Status: AC
  Filled 2012-08-20: qty 2

## 2012-08-20 MED ORDER — MIDAZOLAM HCL 2 MG/2ML IJ SOLN
INTRAMUSCULAR | Status: AC
  Filled 2012-08-20: qty 2

## 2012-08-20 MED ORDER — INSULIN ASPART 100 UNIT/ML ~~LOC~~ SOLN
0.0000 [IU] | Freq: Three times a day (TID) | SUBCUTANEOUS | Status: DC
  Administered 2012-08-21 – 2012-08-24 (×5): 2 [IU] via SUBCUTANEOUS
  Administered 2012-08-25: 1 [IU] via SUBCUTANEOUS
  Administered 2012-08-26: 2 [IU] via SUBCUTANEOUS

## 2012-08-20 MED ORDER — MIDAZOLAM HCL 2 MG/2ML IJ SOLN
INTRAMUSCULAR | Status: AC | PRN
  Administered 2012-08-20: 1 mg via INTRAVENOUS

## 2012-08-20 MED ORDER — DEXTROSE 50 % IV SOLN
INTRAVENOUS | Status: AC
  Administered 2012-08-20: 25 mL via INTRAVENOUS
  Filled 2012-08-20: qty 50

## 2012-08-20 MED ORDER — DEXTROSE 50 % IV SOLN
INTRAVENOUS | Status: AC
  Filled 2012-08-20: qty 50

## 2012-08-20 MED ORDER — HYDROMORPHONE HCL PF 1 MG/ML IJ SOLN
0.5000 mg | Freq: Once | INTRAMUSCULAR | Status: AC
  Administered 2012-08-20: 0.5 mg via INTRAVENOUS
  Filled 2012-08-20: qty 1

## 2012-08-20 NOTE — Progress Notes (Signed)
[facility] PROGRESS NOTE  Assessment/Plan: *Worsening leukocytosis and fevers dur abdominal abscess: - start vanc., micafungin and zosyn,  Ct abdomen and pelvis with contrast: multiple abscess, surgery consulted. - recommended percutaneous drain by  IR 3.8.2014   Chronic systolic congestive heart failure, NYHA class 3 - decompensated, overload improved. Cough improved - Received an additional HD treatment  3.6.2014. - start metoprolol. mildly tachycardic.    Hyponatremia - resolved with HD    ESRD on hemodialysis - HD TTS. - Received an additional HD treatment  3.6.2014.    Anemia in chronic kidney disease (CKD) - Hemoglobin 7.2 on admission. The patient has received 2 units of PRBCs 08/14/2012.  -  Hemoglobin 8.4, monitor    Pancreatitis, chronic - stable.     Hypertension - BP stable.  Code Status: full code  Family Communication: no family at bedside  Disposition Plan: to SNF when stable     Consultants:  nephrology  Procedures: Echo 3.5.2014: The estimated ejection fraction was in the range of 20% to 25%, Peak RV-RA gradient: 41mm Hg (S).      Antibiotics:  vanc zosyn 3.4.2014- 3.5.2014  levaquin 3.5.2014  HPI/Subjective Feels better   Objective: Filed Vitals:   08/20/12 0730 08/20/12 0800 08/20/12 0816 08/20/12 0825  BP: 86/36 91/55 96/50  94/60  Pulse: 70 72 72 76  Temp:    97 F (36.1 C)  TempSrc:    Oral  Resp: 16 15 16 18   Height:      Weight:    50.5 kg (111 lb 5.3 oz)  SpO2:    96%    Intake/Output Summary (Last 24 hours) at 08/20/12 1124 Last data filed at 08/20/12 0825  Gross per 24 hour  Intake      0 ml  Output   2050 ml  Net  -2050 ml   Filed Weights   08/18/12 2034 08/20/12 0400 08/20/12 0825  Weight: 51.3 kg (113 lb 1.5 oz) 51.8 kg (114 lb 3.2 oz) 50.5 kg (111 lb 5.3 oz)    Exam:  General: Alert, awake, oriented x3, in no acute distress.  HEENT: No bruits, no goiter. + JVD Heart: Regular rate and  rhythm, without murmurs, rubs, gallops.  Lungs: Good air movement, crackles bilaterally. Abdomen: Soft, LLQ tender no rebound, +BS. EXT: lower extremity edema.   Data Reviewed: Basic Metabolic Panel:  Recent Labs Lab 08/15/12 0532 08/16/12 0500 08/17/12 1400 08/18/12 1344 08/20/12 0516  NA 132* 133* 129* 131* 129*  K 4.2 3.8 3.1* 3.9 3.8  CL 96 95* 77* 94* 93*  CO2 23 25 21 27 29   GLUCOSE 92 182* 475* 221* 58*  BUN 40* 56* 33* 37* 31*  CREATININE 2.95* 3.49* 2.23* 2.52* 2.42*  CALCIUM 9.6 10.2 8.1* 10.5 11.2*  PHOS  --  4.5  --  3.4 3.7   Liver Function Tests:  Recent Labs Lab 08/13/12 2142 08/16/12 0500 08/18/12 1344 08/20/12 0516  AST 20  --   --   --   ALT 11  --   --   --   ALKPHOS 172*  --   --   --   BILITOT 0.3  --   --   --   PROT 7.0  --   --   --   ALBUMIN 1.7* 1.7* 1.6* 1.5*    Recent Labs Lab 08/13/12 2142  LIPASE 24   No results found for this basename: AMMONIA,  in the last 168 hours CBC:  Recent Labs Lab 08/13/12  2142  08/16/12 0500 08/17/12 1400 08/18/12 1344 08/19/12 1021 08/20/12 0500  WBC 18.9*  < > 14.1* 11.2* 16.4* 13.4* 13.6*  NEUTROABS 16.2*  --   --   --   --   --   --   HGB 7.8*  < > 9.6* 7.6* 9.0* 8.8* 8.4*  HCT 24.7*  < > 29.8* 24.0* 28.1* 28.9* 27.0*  MCV 91.1  < > 90.6 92.0 90.9 94.1 93.8  PLT 286  < > 236 154 193 PLATELET CLUMPS NOTED ON SMEAR, COUNT APPEARS ADEQUATE 185  < > = values in this interval not displayed. Cardiac Enzymes: No results found for this basename: CKTOTAL, CKMB, CKMBINDEX, TROPONINI,  in the last 168 hours BNP (last 3 results)  Recent Labs  04/21/12 1419 05/08/12 0515 06/02/12 1944  PROBNP >70000.0* >70000.0* >70000.0*   CBG:  Recent Labs Lab 08/20/12 0839 08/20/12 0906  GLUCAP 64* 108*    Recent Results (from the past 240 hour(s))  MRSA PCR SCREENING     Status: None   Collection Time    08/14/12  1:24 AM      Result Value Range Status   MRSA by PCR NEGATIVE  NEGATIVE Final    Comment:            The GeneXpert MRSA Assay (FDA     approved for NASAL specimens     only), is one component of a     comprehensive MRSA colonization     surveillance program. It is not     intended to diagnose MRSA     infection nor to guide or     monitor treatment for     MRSA infections.  URINE CULTURE     Status: None   Collection Time    08/14/12  8:06 PM      Result Value Range Status   Specimen Description URINE, CLEAN CATCH   Final   Special Requests NONE   Final   Culture  Setup Time 08/15/2012 01:17   Final   Colony Count >=100,000 COLONIES/ML   Final   Culture     Final   Value: VANCOMYCIN RESISTANT ENTEROCOCCUS ISOLATED     Note: CRITICAL RESULT CALLED TO, READ BACK BY AND VERIFIED WITH: KATHY RICKER @ 0320 ON 08/19/2012   Report Status 08/19/2012 FINAL   Final   Organism ID, Bacteria VANCOMYCIN RESISTANT ENTEROCOCCUS ISOLATED   Final     Studies: Dg Chest 2 View  08/19/2012  *RADIOLOGY REPORT*  Clinical Data: Productive cough with shortness of breath  CHEST - 2 VIEW  Comparison: 08/18/2012  Findings: Right internal jugular dialysis catheter is stable in position. A three lead pacer is in place via a left subclavian approach with lead tips stable and lines appearing intact. The patient is status post median sternotomy and CABG  An interval improvement in lung volumes is noted in comparison with the previous exam.  A stable degree of cardiomegaly is identified. Pulmonary vascular congestion with mild interstitial edema is again noted.  There has been continued improvement in aeration at the right lung base but residual atelectasis is identified as evidenced by posterior displacement of the right major fissure.  No new areas of focal infiltrate or atelectasis are identified. Bony structures remain intact.  IMPRESSION: Persistent cardiomegaly with mild interstitial edema.  Improved right basilar aeration.   Original Report Authenticated By: Rhodia Albright, M.D.    Dg Chest 2  View  08/18/2012  *RADIOLOGY REPORT*  Clinical Data: Follow-up  pleural effusions.  CHEST - 2 VIEW  Comparison: 08/14/2012.  Findings: Right IJ dialysis catheter tip projects over the SVC/RA junction or high right atrium.  Left subclavian pacemaker and AICD lead tips are stable in position.  Heart is enlarged, unchanged. Thoracic aorta is calcified.  Lungs are low in volume with mild diffuse bilateral air space disease.  Right lung aeration may have improved minimally in the interval.  Small bilateral pleural effusions, right greater than left.  IMPRESSION: Congestive heart failure with possible minimal interval improvement in right lung aeration.   Original Report Authenticated By: Leanna Battles, M.D.    Ct Abdomen Pelvis W Contrast  08/19/2012  *RADIOLOGY REPORT*  Clinical Data: Abdominal distension, left lower quadrant tenderness  CT ABDOMEN AND PELVIS WITH CONTRAST  Technique:  Multidetector CT imaging of the abdomen and pelvis was performed following the standard protocol during bolus administration of intravenous contrast.  Contrast:  80 ml Omnipaque-300 IV  Comparison: 08/14/2012  Findings: Patchy right lower lobe opacity, atelectasis versus pneumonia.  Mild associated small right pleural effusion.  Mild patchy left basilar opacity, likely atelectasis, with trace left pleural effusion.  Cardiomegaly.  Heterogeneous perfusion of the liver and spleen.  Portal vein is not visualized, possibly related to phase of enhancement, thrombosis not excluded.  Pancreatic atrophy with fluid/scattered foci of gas along the pancreatic tail and splenic hilum.  3.3 x 1.9 cm fluid and gas collection along the left lateral abdominal wall (series 2/image 21), suspicious for complex pseudocyst/abscess, mildly increased (previously 2.0 x 1.0 cm).  1.5 x 2.7 cm fluid and gas collection anterior to the proximal pancreatic body (series 2/image 19), new, suspicious for developing complex pseudocyst/abscess.  Adrenal glands are  unremarkable.  Status post cholecystectomy.  Kidneys are notable for renal atrophy and bilateral renal cysts.  No evidence of bowel obstruction.  Large volume abdominopelvic ascites.  Mild peritoneal thickening in the right paracolic gutter (series 2/image 28), possibly reflecting peritonitis.  Pelvic organs are poorly visualized due to ascites and poor soft tissues contrast.  Body wall edema / anasarca.  Degenerative changes of the visualized thoracolumbar spine.  IMPRESSION: Pancreatic atrophy with fluid/scattered foci of gas along the pancreatic tail and splenic hilum.  3.3 x 1.9 cm fluid and gas collection along the left lateral abdominal wall, increased, suspicious for a complex pseudocyst/abscess.  1.5 x 2.7 cm fluid and gas collection anterior to the pancreatic body, new, worrisome for developing complex pseudocyst/abscess.  Large volume abdominopelvic ascites.  Mild peritoneal thickening in the right paracolic gutter, possibly reflecting peritonitis.  Patchy right lower lobe opacity, atelectasis versus pneumonia, with associated small right pleural effusion.   Original Report Authenticated By: Charline Bills, M.D.     Scheduled Meds: . aspirin EC  81 mg Oral Daily  . chlorpheniramine-HYDROcodone  5 mL Oral Q12H  . [START ON 08/25/2012] darbepoetin (ARANESP) injection - DIALYSIS  100 mcg Intravenous Q Thu-HD  . docusate sodium  100 mg Oral BID  . enoxaparin (LOVENOX) injection  30 mg Subcutaneous Q24H  . feeding supplement (NEPRO CARB STEADY)  237 mL Oral BID WC  . ferric gluconate (FERRLECIT/NULECIT) IV  125 mg Intravenous Q T,Th,Sa-HD  . insulin aspart  0-9 Units Subcutaneous TID WC  . methadone  10 mg Oral QID  . metoprolol tartrate  12.5 mg Oral BID  . micafungin (MYCAMINE) IV  100 mg Intravenous Q24H  . multivitamin  1 tablet Oral QHS  . pantoprazole  40 mg Oral Daily  . piperacillin-tazobactam (ZOSYN)  IV  2.25 g Intravenous Q8H  . sevelamer  400 mg Oral Q breakfast  . simvastatin   10 mg Oral q1800  . vancomycin  500 mg Intravenous Q T,Th,Sa-HD   Continuous Infusions:    Marinda Elk  [facility] Pager [phone].  If 8PM-8AM, please contact night-coverage at [email or web address], password [facility] 08/20/2012, 11:24 AM  LOS: 7 days

## 2012-08-20 NOTE — Progress Notes (Signed)
Patient blood glucose found to be 64. 25 of Dextrose administered. Blood glucose re-checked with a result of 108. Mindy RN aware

## 2012-08-20 NOTE — Progress Notes (Signed)
Temp 100.4, ordered blood cultures x 2 per previous electronic order.  [name] with [facility] notified. Will continue to monitor. Steele Berg RN

## 2012-08-20 NOTE — H&P (Signed)
HPI: [name] is an 62 y.o. female with multiple chronic medical problems and has developed recurrent abdominal abscesses secondary to chronic pancreatitis. IR has previously aspirated fluid collections. IR is asked to drain another new abscess. Images reviewed by Dr. Grace Isaac and case d/w surgical team. Can offer aspiration of fluid collection. PMHx and meds reviewed. Pt just completed HD this am, feels ok. Has been NPO  Past Medical History:  Past Medical History  Diagnosis Date  . Coronary artery disease     s/p CABG 2008 with multiple PCIs  . Ischemic cardiomyopathy     Cath 04/2012, significant calcifications  . History of nonadherence to medical treatment   . Systolic CHF     45/4098 echo EF 10%  . Esophageal varices   . Cirrhosis     hx of fatty liver per patient, has known ascites with repeated paracenteses at [facility] as of 2013, also +hx of esoph varices with banding in the past  . Pacemaker   . ICD (implantable cardiac defibrillator) in place   . Hypertension     "used to have HTN; now I'm low" (05/23/2012)  . NSTEMI (non-ST elevated myocardial infarction)     04/2012; Trop peaked to 1.39; 05/23/2012 pt denies ever having MI  . Pneumonia 02/2012    "first time ever" (05/23/2012)  . Chronic bronchitis 1970's thru 2002    "went away when I stopped smoking in 2002" (05/23/2012)  . Diabetes mellitus     Diet controlled  . History of blood transfusion     "alot of them" (05/23/2012)  . Iron (Fe) deficiency anemia     "severe" (05/23/2012)  . Pancreatitis, chronic 10/04/2011    Had severe gallstone pancreatitis in May 2003, no surgery, treated medically then returned in June 2003 for cholecystectomy and cyst gastrostomy for pancreatic pseudocyst. In March 2013 was admitted for hemorrhage into psueodcyst. In April 2013 was treated at [facility] for polymicrobial bacteremia (fungal, MRSA and enterobacter) felt to be due to ruptured pseudocyst, treated medically. Admitted Jan 2014 with abd  pain and had gas and fluid in pancreatic bed. She refused surgery ("would not make it") and had fluid aspirated by IR- gram stain showed yeast and cultures grew enterococcus species, sensitive to amp and vanc. She was seen by ID and discharged on IV vanc/fortaz with HD and po voriconazole.     [name] ESRD on hemodialysis 05/06/2012    T,Th,Sat HD by [employer] [facility] on [address]. Started hemodialysis around July 2013.    [name] CHF (congestive heart failure)   . Complication of vascular access for dialysis 07/13/2012    L arm AVG (placed by Dr. Lyda Perone @ [facility]25) was ligated 1/6 after significant L arm swelling secondary to L SCV stenosis and pacemaker; new access to be placed at right arm on 1/29 by Dr. Hollace Hayward      Past Surgical History:  Past Surgical History  Procedure Laterality Date  . Esophagogastroduodenoscopy  09/15/2011    Procedure: ESOPHAGOGASTRODUODENOSCOPY (EGD);  Surgeon: Theda Belfast, MD;  Location: [facility] ENDOSCOPY;  Service: Endoscopy;  Laterality: N/A;  . Cholecystectomy  2003  . Tonsillectomy and adenoidectomy  1961  . Appendectomy  1973?  [name] Vaginal hysterectomy  1973?  [name] Tubal ligation  1972  . Dilation and curettage of uterus      "bunch from profuse bleeding in the 1970's" (05/23/2012)  . Coronary artery bypass graft  2008    CABG X4  . Coronary angioplasty with stent  placement  2008    "1; day after CABG" (05/23/2012)  . Cardiac catheterization      "before 2008 and 3 wk ago" (05/23/2012)  . Insert / replace / remove pacemaker  06/2011    pacemaker ICD  . Cardiac defibrillator placement  06/2011  . Arteriovenous graft placement  03/2012    right antecub  . Insertion of dialysis catheter  ~ 10/2011    right chest  . Peritoneal catheter insertion  08/2011  . Peritoneal catheter removal  ~ 10/2011    Family History:  Family History  Problem Relation Age of Onset  . Coronary artery disease Mother   . Hypertension Mother   . Diabetes Mother   . Diabetes Sister    . Anesthesia problems Neg Hx     Social History:  reports that she quit smoking about 12 years ago. Her smoking use included Cigarettes. She has a 10.23 pack-year smoking history. She has never used smokeless tobacco. She reports that she does not drink alcohol or use illicit drugs.  Allergies:  Allergies  Allergen Reactions  . Codeine Itching  . Morphine And Related Shortness Of Breath    SOB when given IV once "to me it was mild; I called out fast for help; never had to intubate" (05/23/2012)  . Ace Inhibitors Other (See Comments)    Unknown reaction but her physician stated that she can't take it (specifically lisinopril)  . Tylenol (Acetaminophen) Other (See Comments)    Patient states that the doctor told her she has a Fatty Liver.    Medications: Medications Prior to Admission  Medication Sig Dispense Refill  . aspirin EC 81 MG EC tablet Take 1 tablet (81 mg total) by mouth daily.  30 tablet  0  . HYDROmorphone (DILAUDID) 2 MG tablet Take 1 tablet (2 mg total) by mouth every 6 (six) hours as needed for pain. For pain  30 tablet  0  . methadone (DOLOPHINE) 10 MG tablet Take 1 tablet (10 mg total) by mouth 4 (four) times daily.  120 tablet  0  . multivitamin (RENA-VIT) TABS tablet Take 1 tablet by mouth daily.      . Nutritional Supplements (FEEDING SUPPLEMENT, NEPRO CARB STEADY,) LIQD Take 237 mLs by mouth 2 (two) times daily with breakfast and lunch.      . pantoprazole (PROTONIX) 40 MG tablet Take 40 mg by mouth daily.      . pravastatin (PRAVACHOL) 20 MG tablet Take 20 mg by mouth at bedtime.       . ranolazine (RANEXA) 500 MG 12 hr tablet Take 500 mg by mouth daily.       . sevelamer (RENAGEL) 400 MG tablet Take 400 mg by mouth daily. With a meal        Please HPI for pertinent positives, otherwise complete 10 system ROS negative.  Physical Exam: Blood pressure 94/60, pulse 76, temperature 97 F (36.1 C), temperature source Oral, resp. rate 18, height 5\' 4"  (1.626 m),  weight 111 lb 5.3 oz (50.5 kg), SpO2 96.00%. Body mass index is 19.1 kg/(m^2).   General Appearance:  Alert, cooperative, no distress, appears stated age  Head:  Normocephalic, without obvious abnormality, atraumatic  ENT: Unremarkable, airway clear  Lungs:   Clear to auscultation bilaterally, no w/r/r, respirations unlabored without use of accessory muscles.  Heart:  Regular rate and rhythm, S1, S2 normal, no murmur, rub or gallop. Carotids 2+ without bruit.  Abdomen:   Soft, ND, Tender left lower abd  Neurologic: Normal affect, no gross deficits.   Results for orders placed during the hospital encounter of 08/13/12 (from the past 48 hour(s))  CBC     Status: Abnormal   Collection Time    08/18/12  1:44 PM      Result Value Range   WBC 16.4 (*) 4.0 - 10.5 K/uL   RBC 3.09 (*) 3.87 - 5.11 MIL/uL   Hemoglobin 9.0 (*) 12.0 - 15.0 g/dL   HCT 16.1 (*) 09.6 - 04.5 %   MCV 90.9  78.0 - 100.0 fL   MCH 29.1  26.0 - 34.0 pg   MCHC 32.0  30.0 - 36.0 g/dL   RDW 40.9 (*) 81.1 - 91.4 %   Platelets 193  150 - 400 K/uL  RENAL FUNCTION PANEL     Status: Abnormal   Collection Time    08/18/12  1:44 PM      Result Value Range   Sodium 131 (*) 135 - 145 mEq/L   Potassium 3.9  3.5 - 5.1 mEq/L   Comment: DELTA CHECK NOTED   Chloride 94 (*) 96 - 112 mEq/L   CO2 27  19 - 32 mEq/L   Glucose, Bld 221 (*) 70 - 99 mg/dL   BUN 37 (*) 6 - 23 mg/dL   Creatinine, Ser 7.82 (*) 0.50 - 1.10 mg/dL   Calcium 95.6  8.4 - 21.3 mg/dL   Phosphorus 3.4  2.3 - 4.6 mg/dL   Albumin 1.6 (*) 3.5 - 5.2 g/dL   GFR calc non Af Amer 19 (*) >90 mL/min   GFR calc Af Amer 22 (*) >90 mL/min   Comment:            The eGFR has been calculated     using the CKD EPI equation.     This calculation has not been     validated in all clinical     situations.     eGFR's persistently     <90 mL/min signify     possible Chronic Kidney Disease.  CBC     Status: Abnormal   Collection Time    08/19/12 10:21 AM      Result Value  Range   WBC 13.4 (*) 4.0 - 10.5 K/uL   Comment: WHITE COUNT CONFIRMED ON SMEAR   RBC 3.07 (*) 3.87 - 5.11 MIL/uL   Hemoglobin 8.8 (*) 12.0 - 15.0 g/dL   HCT 08.6 (*) 57.8 - 46.9 %   MCV 94.1  78.0 - 100.0 fL   MCH 28.7  26.0 - 34.0 pg   MCHC 30.4  30.0 - 36.0 g/dL   RDW 62.9 (*) 52.8 - 41.3 %   Platelets    150 - 400 K/uL   Value: PLATELET CLUMPS NOTED ON SMEAR, COUNT APPEARS ADEQUATE  PROTIME-INR     Status: Abnormal   Collection Time    08/19/12  4:48 PM      Result Value Range   Prothrombin Time 19.4 (*) 11.6 - 15.2 seconds   INR 1.70 (*) 0.00 - 1.49  CBC     Status: Abnormal   Collection Time    08/20/12  5:00 AM      Result Value Range   WBC 13.6 (*) 4.0 - 10.5 K/uL   RBC 2.88 (*) 3.87 - 5.11 MIL/uL   Hemoglobin 8.4 (*) 12.0 - 15.0 g/dL   HCT 24.4 (*) 01.0 - 27.2 %   MCV 93.8  78.0 - 100.0 fL   MCH 29.2  26.0 - 34.0 pg   MCHC 31.1  30.0 - 36.0 g/dL   RDW 16.1 (*) 09.6 - 04.5 %   Platelets 185  150 - 400 K/uL  VANCOMYCIN, RANDOM     Status: None   Collection Time    08/20/12  5:00 AM      Result Value Range   Vancomycin Rm 27.3     Comment:            Random Vancomycin therapeutic     range is dependent on dosage and     time of specimen collection.     A peak range is 20.0-40.0 ug/mL     A trough range is 5.0-15.0 ug/mL             RENAL FUNCTION PANEL     Status: Abnormal   Collection Time    08/20/12  5:16 AM      Result Value Range   Sodium 129 (*) 135 - 145 mEq/L   Potassium 3.8  3.5 - 5.1 mEq/L   Chloride 93 (*) 96 - 112 mEq/L   CO2 29  19 - 32 mEq/L   Glucose, Bld 58 (*) 70 - 99 mg/dL   BUN 31 (*) 6 - 23 mg/dL   Creatinine, Ser 4.09 (*) 0.50 - 1.10 mg/dL   Calcium 81.1 (*) 8.4 - 10.5 mg/dL   Phosphorus 3.7  2.3 - 4.6 mg/dL   Albumin 1.5 (*) 3.5 - 5.2 g/dL   GFR calc non Af Amer 20 (*) >90 mL/min   GFR calc Af Amer 23 (*) >90 mL/min   Comment:            The eGFR has been calculated     using the CKD EPI equation.     This calculation has not  been     validated in all clinical     situations.     eGFR's persistently     <90 mL/min signify     possible Chronic Kidney Disease.  GLUCOSE, CAPILLARY     Status: Abnormal   Collection Time    08/20/12  8:39 AM      Result Value Range   Glucose-Capillary 64 (*) 70 - 99 mg/dL  GLUCOSE, CAPILLARY     Status: Abnormal   Collection Time    08/20/12  9:06 AM      Result Value Range   Glucose-Capillary 108 (*) 70 - 99 mg/dL   Dg Chest 2 View  02/13/4781  *RADIOLOGY REPORT*  Clinical Data: Productive cough with shortness of breath  CHEST - 2 VIEW  Comparison: 08/18/2012  Findings: Right internal jugular dialysis catheter is stable in position. A three lead pacer is in place via a left subclavian approach with lead tips stable and lines appearing intact. The patient is status post median sternotomy and CABG  An interval improvement in lung volumes is noted in comparison with the previous exam.  A stable degree of cardiomegaly is identified. Pulmonary vascular congestion with mild interstitial edema is again noted.  There has been continued improvement in aeration at the right lung base but residual atelectasis is identified as evidenced by posterior displacement of the right major fissure.  No new areas of focal infiltrate or atelectasis are identified. Bony structures remain intact.  IMPRESSION: Persistent cardiomegaly with mild interstitial edema.  Improved right basilar aeration.   Original Report Authenticated By: Rhodia Albright, M.D.    Dg Chest 2 View  08/18/2012  *RADIOLOGY REPORT*  Clinical  Data: Follow-up pleural effusions.  CHEST - 2 VIEW  Comparison: 08/14/2012.  Findings: Right IJ dialysis catheter tip projects over the SVC/RA junction or high right atrium.  Left subclavian pacemaker and AICD lead tips are stable in position.  Heart is enlarged, unchanged. Thoracic aorta is calcified.  Lungs are low in volume with mild diffuse bilateral air space disease.  Right lung aeration may have  improved minimally in the interval.  Small bilateral pleural effusions, right greater than left.  IMPRESSION: Congestive heart failure with possible minimal interval improvement in right lung aeration.   Original Report Authenticated By: Leanna Battles, M.D.    Ct Abdomen Pelvis W Contrast  08/19/2012  *RADIOLOGY REPORT*  Clinical Data: Abdominal distension, left lower quadrant tenderness  CT ABDOMEN AND PELVIS WITH CONTRAST  Technique:  Multidetector CT imaging of the abdomen and pelvis was performed following the standard protocol during bolus administration of intravenous contrast.  Contrast:  80 ml Omnipaque-300 IV  Comparison: 08/14/2012  Findings: Patchy right lower lobe opacity, atelectasis versus pneumonia.  Mild associated small right pleural effusion.  Mild patchy left basilar opacity, likely atelectasis, with trace left pleural effusion.  Cardiomegaly.  Heterogeneous perfusion of the liver and spleen.  Portal vein is not visualized, possibly related to phase of enhancement, thrombosis not excluded.  Pancreatic atrophy with fluid/scattered foci of gas along the pancreatic tail and splenic hilum.  3.3 x 1.9 cm fluid and gas collection along the left lateral abdominal wall (series 2/image 21), suspicious for complex pseudocyst/abscess, mildly increased (previously 2.0 x 1.0 cm).  1.5 x 2.7 cm fluid and gas collection anterior to the proximal pancreatic body (series 2/image 19), new, suspicious for developing complex pseudocyst/abscess.  Adrenal glands are unremarkable.  Status post cholecystectomy.  Kidneys are notable for renal atrophy and bilateral renal cysts.  No evidence of bowel obstruction.  Large volume abdominopelvic ascites.  Mild peritoneal thickening in the right paracolic gutter (series 2/image 28), possibly reflecting peritonitis.  Pelvic organs are poorly visualized due to ascites and poor soft tissues contrast.  Body wall edema / anasarca.  Degenerative changes of the visualized  thoracolumbar spine.  IMPRESSION: Pancreatic atrophy with fluid/scattered foci of gas along the pancreatic tail and splenic hilum.  3.3 x 1.9 cm fluid and gas collection along the left lateral abdominal wall, increased, suspicious for a complex pseudocyst/abscess.  1.5 x 2.7 cm fluid and gas collection anterior to the pancreatic body, new, worrisome for developing complex pseudocyst/abscess.  Large volume abdominopelvic ascites.  Mild peritoneal thickening in the right paracolic gutter, possibly reflecting peritonitis.  Patchy right lower lobe opacity, atelectasis versus pneumonia, with associated small right pleural effusion.   Original Report Authenticated By: Charline Bills, M.D.     Assessment/Plan Recurrent abdominal abscesses/pseudocysts from chronic pancreatitis. Discussed CT guided aspiration with pt, including risks, complications. Labs reviewed, ok Consent signed in chart  Brayton El PA-C 08/20/2012, 9:13 AM

## 2012-08-20 NOTE — Progress Notes (Signed)
[facility] Progress Note  Subjective:   Still with some LLQ pain.  Pleased to be having fluid drained from abdomen today.   Objective Filed Vitals:   08/20/12 0730 08/20/12 0800 08/20/12 0816 08/20/12 0825  BP: 86/36 91/55 96/50  94/60  Pulse: 70 72 72 76  Temp:    97 F (36.1 C)  TempSrc:    Oral  Resp: 16 15 16 18   Height:      Weight:    50.5 kg (111 lb 5.3 oz)  SpO2:    96%   Physical Exam General: Chronically ill-appearing, NAD Heart: RRR, AICD left chest.  Lungs: Diffuse crackles.  Coarse rhonchi bilaterally. + dry cough Abdomen: Soft, + distension, tender to med palpation LLQ, normal bowel sounds. Extremities: 2+ LE edema Dialysis Access: Rt IJ   Dialysis Orders: Center: [facility] on TTS.  EDW 53.5 kg HD Bath 3K/2.5Ca Time 4hrs Heparin 1600 U. Access Right IJ catheter BFR 400 DFR 800 Hectorol 0 mcg IV/HD Epogen 1500 Units IV/HD Venofer 0.  Assessment/Plan: 1. SOB/presumed HCAP/right pleural effusion; On Vanc and zoysn. Had extra HD treatment Wed. Post HD CXR with minimal improvement in right lung aeration.  2. LLQ abdominal pain - CT abdomen/pelvis showed large volume ascites and new area suspicious for developing pseudocyst/abscess.  PT scheduled for aspiration of fluid collections today along with abx coverage of zosyn and voriconazole. Pt is at high risk for any surgical intervention and does not wish to consider.  3. ESRD - TTS HD; chronic hyponatremia secondary to cirrhosis. K+ 3.8 - Continue 4K+ bath for now. Next HD Tuesday. 4. Anemia - Hgb 8.4 s/p 2 units on 3/2; Monitor Hgb and transfuse prn. Aranesp 100. Tsat 22% and Ferritin 1513 on 2/20.  IV fe. 5. Secondary hyperparathyroidism - P controlled; no hectorol; corrected Ca remains elevated; Low Ca bath. Renvela.  6. HTN/volume/ascities - Soft SBPs 86-92 post HD. Currently on metoprolol 12.5 BID - may need to decrease. Post wgt 50.5. Under EDW. Net UF 2050 this am.  Will need new EDW  - this will be a moving  target given frequent re-accumulation & drainage of her ascites.  7. Nutrition - Albumin 1.5. (Has PC) NPO for now. Reg diet with restriction of K+ and fluids when appropriate.  8. Chronic pancreatitis - Recurrent pancreatic abscess per above   Clydie Braun E. Thad Ranger [facility] [phone] Pager 08/20/2012,11:04 AM  LOS: 7 days    Additional Objective Labs: Basic Metabolic Panel:  Recent Labs Lab 08/16/12 0500 08/17/12 1400 08/18/12 1344 08/20/12 0516  NA 133* 129* 131* 129*  K 3.8 3.1* 3.9 3.8  CL 95* 77* 94* 93*  CO2 25 21 27 29   GLUCOSE 182* 475* 221* 58*  BUN 56* 33* 37* 31*  CREATININE 3.49* 2.23* 2.52* 2.42*  CALCIUM 10.2 8.1* 10.5 11.2*  PHOS 4.5  --  3.4 3.7   Liver Function Tests:  Recent Labs Lab 08/13/12 2142 08/16/12 0500 08/18/12 1344 08/20/12 0516  AST 20  --   --   --   ALT 11  --   --   --   ALKPHOS 172*  --   --   --   BILITOT 0.3  --   --   --   PROT 7.0  --   --   --   ALBUMIN 1.7* 1.7* 1.6* 1.5*    Recent Labs Lab 08/13/12 2142  LIPASE 24   CBC:  Recent Labs Lab 08/13/12 2142  08/16/12  0500 08/17/12 1400 08/18/12 1344 08/19/12 1021 08/20/12 0500  WBC 18.9*  < > 14.1* 11.2* 16.4* 13.4* 13.6*  NEUTROABS 16.2*  --   --   --   --   --   --   HGB 7.8*  < > 9.6* 7.6* 9.0* 8.8* 8.4*  HCT 24.7*  < > 29.8* 24.0* 28.1* 28.9* 27.0*  MCV 91.1  < > 90.6 92.0 90.9 94.1 93.8  PLT 286  < > 236 154 193 PLATELET CLUMPS NOTED ON SMEAR, COUNT APPEARS ADEQUATE 185  < > = values in this interval not displayed. Blood Culture    Component Value Date/Time   SDES URINE, CLEAN CATCH 08/14/2012 2006   SPECREQUEST NONE 08/14/2012 2006   CULT  Value: VANCOMYCIN RESISTANT ENTEROCOCCUS ISOLATED Note: CRITICAL RESULT CALLED TO, READ BACK BY AND VERIFIED WITH: KATHY RICKER @ 0320 ON 08/19/2012 08/14/2012 2006   REPTSTATUS 08/19/2012 FINAL 08/14/2012 2006    CBG:  Recent Labs Lab 08/20/12 0839 08/20/12 0906  GLUCAP 64* 108*    Studies/Results: Dg Chest 2 View  08/19/2012  *RADIOLOGY REPORT*  Clinical Data: Productive cough with shortness of breath  CHEST - 2 VIEW  Comparison: 08/18/2012  Findings: Right internal jugular dialysis catheter is stable in position. A three lead pacer is in place via a left subclavian approach with lead tips stable and lines appearing intact. The patient is status post median sternotomy and CABG  An interval improvement in lung volumes is noted in comparison with the previous exam.  A stable degree of cardiomegaly is identified. Pulmonary vascular congestion with mild interstitial edema is again noted.  There has been continued improvement in aeration at the right lung base but residual atelectasis is identified as evidenced by posterior displacement of the right major fissure.  No new areas of focal infiltrate or atelectasis are identified. Bony structures remain intact.  IMPRESSION: Persistent cardiomegaly with mild interstitial edema.  Improved right basilar aeration.   Original Report Authenticated By: Rhodia Albright, M.D.    Dg Chest 2 View  08/18/2012  *RADIOLOGY REPORT*  Clinical Data: Follow-up pleural effusions.  CHEST - 2 VIEW  Comparison: 08/14/2012.  Findings: Right IJ dialysis catheter tip projects over the SVC/RA junction or high right atrium.  Left subclavian pacemaker and AICD lead tips are stable in position.  Heart is enlarged, unchanged. Thoracic aorta is calcified.  Lungs are low in volume with mild diffuse bilateral air space disease.  Right lung aeration may have improved minimally in the interval.  Small bilateral pleural effusions, right greater than left.  IMPRESSION: Congestive heart failure with possible minimal interval improvement in right lung aeration.   Original Report Authenticated By: Leanna Battles, M.D.    Ct Abdomen Pelvis W Contrast  08/19/2012  *RADIOLOGY REPORT*  Clinical Data: Abdominal distension, left lower quadrant tenderness  CT ABDOMEN AND PELVIS WITH  CONTRAST  Technique:  Multidetector CT imaging of the abdomen and pelvis was performed following the standard protocol during bolus administration of intravenous contrast.  Contrast:  80 ml Omnipaque-300 IV  Comparison: 08/14/2012  Findings: Patchy right lower lobe opacity, atelectasis versus pneumonia.  Mild associated small right pleural effusion.  Mild patchy left basilar opacity, likely atelectasis, with trace left pleural effusion.  Cardiomegaly.  Heterogeneous perfusion of the liver and spleen.  Portal vein is not visualized, possibly related to phase of enhancement, thrombosis not excluded.  Pancreatic atrophy with fluid/scattered foci of gas along the pancreatic tail and splenic hilum.  3.3 x 1.9 cm  fluid and gas collection along the left lateral abdominal wall (series 2/image 21), suspicious for complex pseudocyst/abscess, mildly increased (previously 2.0 x 1.0 cm).  1.5 x 2.7 cm fluid and gas collection anterior to the proximal pancreatic body (series 2/image 19), new, suspicious for developing complex pseudocyst/abscess.  Adrenal glands are unremarkable.  Status post cholecystectomy.  Kidneys are notable for renal atrophy and bilateral renal cysts.  No evidence of bowel obstruction.  Large volume abdominopelvic ascites.  Mild peritoneal thickening in the right paracolic gutter (series 2/image 28), possibly reflecting peritonitis.  Pelvic organs are poorly visualized due to ascites and poor soft tissues contrast.  Body wall edema / anasarca.  Degenerative changes of the visualized thoracolumbar spine.  IMPRESSION: Pancreatic atrophy with fluid/scattered foci of gas along the pancreatic tail and splenic hilum.  3.3 x 1.9 cm fluid and gas collection along the left lateral abdominal wall, increased, suspicious for a complex pseudocyst/abscess.  1.5 x 2.7 cm fluid and gas collection anterior to the pancreatic body, new, worrisome for developing complex pseudocyst/abscess.  Large volume abdominopelvic ascites.   Mild peritoneal thickening in the right paracolic gutter, possibly reflecting peritonitis.  Patchy right lower lobe opacity, atelectasis versus pneumonia, with associated small right pleural effusion.   Original Report Authenticated By: Charline Bills, M.D.    Medications:   . aspirin EC  81 mg Oral Daily  . chlorpheniramine-HYDROcodone  5 mL Oral Q12H  . [START ON 08/25/2012] darbepoetin (ARANESP) injection - DIALYSIS  100 mcg Intravenous Q Thu-HD  . docusate sodium  100 mg Oral BID  . enoxaparin (LOVENOX) injection  30 mg Subcutaneous Q24H  . feeding supplement (NEPRO CARB STEADY)  237 mL Oral BID WC  . ferric gluconate (FERRLECIT/NULECIT) IV  125 mg Intravenous Q T,Th,Sa-HD  . insulin aspart  0-9 Units Subcutaneous TID WC  . methadone  10 mg Oral QID  . metoprolol tartrate  12.5 mg Oral BID  . micafungin (MYCAMINE) IV  100 mg Intravenous Q24H  . multivitamin  1 tablet Oral QHS  . pantoprazole  40 mg Oral Daily  . piperacillin-tazobactam (ZOSYN)  IV  2.25 g Intravenous Q8H  . sevelamer  400 mg Oral Q breakfast  . simvastatin  10 mg Oral q1800  . vancomycin  500 mg Intravenous Q T,Th,Sa-HD   I have seen and examined this patient and agree with plan as outlined by K. Broadus John, PA-C.  Pt with recurrent pancreatic abscess and for IR drainage today.  Will cont with HD qTTS while she remains an inpt. COLADONATO,JOSEPH A,MD 08/20/2012 12:14 PM

## 2012-08-20 NOTE — Progress Notes (Signed)
Patient ID: [name], female   DOB: 20-Oct-1950, 62 y.o.   MRN: [id number]    Subjective: No change. Still with left-sided abdominal pain. Awaiting her IR procedure  Objective: Vital signs in last 24 hours: Temp:  [97 F (36.1 C)-98.9 F (37.2 C)] 97 F (36.1 C) (03/08 0825) Pulse Rate:  [70-93] 76 (03/08 0825) Resp:  [15-22] 18 (03/08 0825) BP: (86-112)/(36-77) 94/60 mmHg (03/08 0825) SpO2:  [95 %-100 %] 96 % (03/08 0825) Weight:  [111 lb 5.3 oz (50.5 kg)-114 lb 3.2 oz (51.8 kg)] 111 lb 5.3 oz (50.5 kg) (03/08 0825) Last BM Date: 08/19/12  Intake/Output from previous day: 03/07 0701 - 03/08 0700 In: 120 [P.O.:120] Out: -  Intake/Output this shift: Total I/O In: -  Out: 2050 [Other:2050]  General appearance: alert, cooperative and no distress GI: mild tenderness in the left midabdomen  Lab Results:   Recent Labs  08/19/12 1021 08/20/12 0500  WBC 13.4* 13.6*  HGB 8.8* 8.4*  HCT 28.9* 27.0*  PLT PLATELET CLUMPS NOTED ON SMEAR, COUNT APPEARS ADEQUATE 185   BMET  Recent Labs  08/18/12 1344 08/20/12 0516  NA 131* 129*  K 3.9 3.8  CL 94* 93*  CO2 27 29  GLUCOSE 221* 58*  BUN 37* 31*  CREATININE 2.52* 2.42*  CALCIUM 10.5 11.2*     Studies/Results: Dg Chest 2 View  08/19/2012  *RADIOLOGY REPORT*  Clinical Data: Productive cough with shortness of breath  CHEST - 2 VIEW  Comparison: 08/18/2012  Findings: Right internal jugular dialysis catheter is stable in position. A three lead pacer is in place via a left subclavian approach with lead tips stable and lines appearing intact. The patient is status post median sternotomy and CABG  An interval improvement in lung volumes is noted in comparison with the previous exam.  A stable degree of cardiomegaly is identified. Pulmonary vascular congestion with mild interstitial edema is again noted.  There has been continued improvement in aeration at the right lung base but residual atelectasis is identified as evidenced by  posterior displacement of the right major fissure.  No new areas of focal infiltrate or atelectasis are identified. Bony structures remain intact.  IMPRESSION: Persistent cardiomegaly with mild interstitial edema.  Improved right basilar aeration.   Original Report Authenticated By: Rhodia Albright, M.D.    Ct Abdomen Pelvis W Contrast  08/19/2012  *RADIOLOGY REPORT*  Clinical Data: Abdominal distension, left lower quadrant tenderness  CT ABDOMEN AND PELVIS WITH CONTRAST  Technique:  Multidetector CT imaging of the abdomen and pelvis was performed following the standard protocol during bolus administration of intravenous contrast.  Contrast:  80 ml Omnipaque-300 IV  Comparison: 08/14/2012  Findings: Patchy right lower lobe opacity, atelectasis versus pneumonia.  Mild associated small right pleural effusion.  Mild patchy left basilar opacity, likely atelectasis, with trace left pleural effusion.  Cardiomegaly.  Heterogeneous perfusion of the liver and spleen.  Portal vein is not visualized, possibly related to phase of enhancement, thrombosis not excluded.  Pancreatic atrophy with fluid/scattered foci of gas along the pancreatic tail and splenic hilum.  3.3 x 1.9 cm fluid and gas collection along the left lateral abdominal wall (series 2/image 21), suspicious for complex pseudocyst/abscess, mildly increased (previously 2.0 x 1.0 cm).  1.5 x 2.7 cm fluid and gas collection anterior to the proximal pancreatic body (series 2/image 19), new, suspicious for developing complex pseudocyst/abscess.  Adrenal glands are unremarkable.  Status post cholecystectomy.  Kidneys are notable for renal atrophy and bilateral  renal cysts.  No evidence of bowel obstruction.  Large volume abdominopelvic ascites.  Mild peritoneal thickening in the right paracolic gutter (series 2/image 28), possibly reflecting peritonitis.  Pelvic organs are poorly visualized due to ascites and poor soft tissues contrast.  Body wall edema / anasarca.   Degenerative changes of the visualized thoracolumbar spine.  IMPRESSION: Pancreatic atrophy with fluid/scattered foci of gas along the pancreatic tail and splenic hilum.  3.3 x 1.9 cm fluid and gas collection along the left lateral abdominal wall, increased, suspicious for a complex pseudocyst/abscess.  1.5 x 2.7 cm fluid and gas collection anterior to the pancreatic body, new, worrisome for developing complex pseudocyst/abscess.  Large volume abdominopelvic ascites.  Mild peritoneal thickening in the right paracolic gutter, possibly reflecting peritonitis.  Patchy right lower lobe opacity, atelectasis versus pneumonia, with associated small right pleural effusion.   Original Report Authenticated By: Charline Bills, M.D.     Anti-infectives: Anti-infectives   Start     Dose/Rate Route Frequency Ordered Stop   08/20/12 1200  ceFEPIme (MAXIPIME) 2 g in dextrose 5 % 50 mL IVPB  Status:  Discontinued     2 g 100 mL/hr over 30 Minutes Intravenous Every T-Th-Sa (Hemodialysis) 08/19/12 0736 08/19/12 1614   08/20/12 1200  vancomycin (VANCOCIN) 500 mg in sodium chloride 0.9 % 100 mL IVPB     500 mg 100 mL/hr over 60 Minutes Intravenous Every T-Th-Sa (Hemodialysis) 08/19/12 0746     08/19/12 1800  micafungin (MYCAMINE) 100 mg in sodium chloride 0.9 % 100 mL IVPB     100 mg 100 mL/hr over 1 Hours Intravenous Every 24 hours 08/19/12 1639     08/19/12 1700  piperacillin-tazobactam (ZOSYN) IVPB 2.25 g     2.25 g 100 mL/hr over 30 Minutes Intravenous 3 times per day 08/19/12 1637     08/19/12 1630  voriconazole (VFEND) 210 mg in sodium chloride 0.9 % 100 mL IVPB  Status:  Discontinued     4 mg/kg  51.3 kg 60.5 mL/hr over 120 Minutes Intravenous Every 12 hours 08/19/12 1618 08/19/12 1639   08/19/12 1400  levofloxacin (LEVAQUIN) tablet 500 mg  Status:  Discontinued     500 mg Oral Every 48 hours 08/17/12 1233 08/19/12 1614   08/19/12 0830  ceFEPIme (MAXIPIME) 2 g in dextrose 5 % 50 mL IVPB  Status:   Discontinued     2 g 100 mL/hr over 30 Minutes Intravenous  Once 08/19/12 0736 08/19/12 1625   08/19/12 0830  vancomycin (VANCOCIN) IVPB 1000 mg/200 mL premix     1,000 mg 200 mL/hr over 60 Minutes Intravenous  Once 08/19/12 0746 08/19/12 1727   08/17/12 1400  levofloxacin (LEVAQUIN) tablet 750 mg     750 mg Oral  Once 08/17/12 1233 08/17/12 2218   08/17/12 1230  levofloxacin (LEVAQUIN) tablet 750 mg  Status:  Discontinued     750 mg Oral Every 48 hours 08/17/12 1229 08/17/12 1232   08/16/12 1200  vancomycin (VANCOCIN) 500 mg in sodium chloride 0.9 % 100 mL IVPB  Status:  Discontinued     500 mg 100 mL/hr over 60 Minutes Intravenous Every T-Th-Sa (Hemodialysis) 08/14/12 0128 08/18/12 0848   08/14/12 2030  piperacillin-tazobactam (ZOSYN) IVPB 2.25 g  Status:  Discontinued     2.25 g 100 mL/hr over 30 Minutes Intravenous Every 8 hours 08/14/12 2010 08/18/12 0848   08/14/12 0800  piperacillin-tazobactam (ZOSYN) IVPB 2.25 g  Status:  Discontinued     2.25 g 100 mL/hr  over 30 Minutes Intravenous Every 8 hours 08/14/12 0128 08/14/12 2010   08/14/12 0130  vancomycin (VANCOCIN) 1,250 mg in sodium chloride 0.9 % 250 mL IVPB     1,250 mg 166.7 mL/hr over 90 Minutes Intravenous  Once 08/14/12 0128 08/14/12 0359   08/14/12 0130  piperacillin-tazobactam (ZOSYN) IVPB 2.25 g     2.25 g 100 mL/hr over 30 Minutes Intravenous  Once 08/14/12 0128 08/14/12 0259      Assessment/Plan: Recurrent peripancreatic fluid collections, possible abscess. For percutaneous aspiration today.    LOS: 7 days    HOXWORTH,BENJAMIN T 08/20/2012

## 2012-08-20 NOTE — Procedures (Signed)
Technically successful CT guided aspiration of air and fluid containing abscess/pseudocyst within the left upper abdominal quadrant yielding 30 cc of purulent, slightly bloody material.  Samples sent to laboratory.  No immediate post procedural complications.

## 2012-08-21 DIAGNOSIS — R0602 Shortness of breath: Secondary | ICD-10-CM

## 2012-08-21 LAB — GLUCOSE, CAPILLARY: Glucose-Capillary: 165 mg/dL — ABNORMAL HIGH (ref 70–99)

## 2012-08-21 LAB — CREATININE, SERUM
Creatinine, Ser: 1.68 mg/dL — ABNORMAL HIGH (ref 0.50–1.10)
GFR calc non Af Amer: 31 mL/min — ABNORMAL LOW (ref >90)

## 2012-08-21 LAB — PROTIME-INR
INR: 1.64 — ABNORMAL HIGH (ref 0.00–1.49)
Prothrombin Time: 18.9 seconds — ABNORMAL HIGH (ref 11.6–15.2)

## 2012-08-21 MED ORDER — GLUCOSE 40 % PO GEL
ORAL | Status: AC
  Administered 2012-08-21: 37.5 g
  Filled 2012-08-21: qty 1

## 2012-08-21 MED ORDER — HYDROMORPHONE HCL PF 1 MG/ML IJ SOLN
0.5000 mg | INTRAMUSCULAR | Status: DC | PRN
  Administered 2012-08-21 – 2012-08-23 (×6): 0.5 mg via INTRAVENOUS
  Filled 2012-08-21 (×5): qty 1

## 2012-08-21 MED ORDER — DEXTROSE 50 % IV SOLN
INTRAVENOUS | Status: AC
  Administered 2012-08-21: 19:00:00
  Filled 2012-08-21: qty 50

## 2012-08-21 NOTE — Progress Notes (Signed)
Subjective:  CC on admit, increased SOB, with finding of increased WBC. SOB is better, complains of pain left abdomen at site of aspiration   Objective: Vital signs in last 24 hours: Temp:  [97.9 F (36.6 C)-100.4 F (38 C)] 98.2 F (36.8 C) (03/09 0900) Pulse Rate:  [77-107] 89 (03/09 0900) Resp:  [14-19] 18 (03/09 0900) BP: (91-117)/(55-77) 99/62 mmHg (03/09 0900) SpO2:  [93 %-100 %] 95 % (03/09 0900) Weight:  [114 lb 13.8 oz (52.1 kg)] 114 lb 13.8 oz (52.1 kg) (03/08 2129) Last BM Date: 08/21/12 Diet: clears, Tm 100.4, no CBC  Intake/Output from previous day: 03/08 0701 - 03/09 0700 In: 240 [P.O.:240] Out: 2050  Intake/Output this shift: Total I/O In: 240 [P.O.:240] Out: -   General appearance: alert, cooperative and no distress GI: soft tender at site of IR aspiration.  No catheter left in place.  Lab Results:   Recent Labs  08/19/12 1021 08/20/12 0500  WBC 13.4* 13.6*  HGB 8.8* 8.4*  HCT 28.9* 27.0*  PLT PLATELET CLUMPS NOTED ON SMEAR, COUNT APPEARS ADEQUATE 185    BMET  Recent Labs  08/18/12 1344 08/20/12 0516 08/21/12 0500  NA 131* 129*  --   K 3.9 3.8  --   CL 94* 93*  --   CO2 27 29  --   GLUCOSE 221* 58*  --   BUN 37* 31*  --   CREATININE 2.52* 2.42* 1.68*  CALCIUM 10.5 11.2*  --    PT/INR  Recent Labs  08/19/12 1648  LABPROT 19.4*  INR 1.70*     Recent Labs Lab 08/16/12 0500 08/18/12 1344 08/20/12 0516  ALBUMIN 1.7* 1.6* 1.5*     Lipase     Component Value Date/Time   LIPASE 24 08/13/2012 2142     Studies/Results: Dg Chest 2 View  08/19/2012  *RADIOLOGY REPORT*  Clinical Data: Productive cough with shortness of breath  CHEST - 2 VIEW  Comparison: 08/18/2012  Findings: Right internal jugular dialysis catheter is stable in position. A three lead pacer is in place via a left subclavian approach with lead tips stable and lines appearing intact. The patient is status post median sternotomy and CABG  An interval improvement in  lung volumes is noted in comparison with the previous exam.  A stable degree of cardiomegaly is identified. Pulmonary vascular congestion with mild interstitial edema is again noted.  There has been continued improvement in aeration at the right lung base but residual atelectasis is identified as evidenced by posterior displacement of the right major fissure.  No new areas of focal infiltrate or atelectasis are identified. Bony structures remain intact.  IMPRESSION: Persistent cardiomegaly with mild interstitial edema.  Improved right basilar aeration.   Original Report Authenticated By: Rhodia Albright, M.D.    Ct Abdomen Pelvis W Contrast  08/19/2012  *RADIOLOGY REPORT*  Clinical Data: Abdominal distension, left lower quadrant tenderness  CT ABDOMEN AND PELVIS WITH CONTRAST  Technique:  Multidetector CT imaging of the abdomen and pelvis was performed following the standard protocol during bolus administration of intravenous contrast.  Contrast:  80 ml Omnipaque-300 IV  Comparison: 08/14/2012  Findings: Patchy right lower lobe opacity, atelectasis versus pneumonia.  Mild associated small right pleural effusion.  Mild patchy left basilar opacity, likely atelectasis, with trace left pleural effusion.  Cardiomegaly.  Heterogeneous perfusion of the liver and spleen.  Portal vein is not visualized, possibly related to phase of enhancement, thrombosis not excluded.  Pancreatic atrophy with fluid/scattered foci of gas  along the pancreatic tail and splenic hilum.  3.3 x 1.9 cm fluid and gas collection along the left lateral abdominal wall (series 2/image 21), suspicious for complex pseudocyst/abscess, mildly increased (previously 2.0 x 1.0 cm).  1.5 x 2.7 cm fluid and gas collection anterior to the proximal pancreatic body (series 2/image 19), new, suspicious for developing complex pseudocyst/abscess.  Adrenal glands are unremarkable.  Status post cholecystectomy.  Kidneys are notable for renal atrophy and bilateral  renal cysts.  No evidence of bowel obstruction.  Large volume abdominopelvic ascites.  Mild peritoneal thickening in the right paracolic gutter (series 2/image 28), possibly reflecting peritonitis.  Pelvic organs are poorly visualized due to ascites and poor soft tissues contrast.  Body wall edema / anasarca.  Degenerative changes of the visualized thoracolumbar spine.  IMPRESSION: Pancreatic atrophy with fluid/scattered foci of gas along the pancreatic tail and splenic hilum.  3.3 x 1.9 cm fluid and gas collection along the left lateral abdominal wall, increased, suspicious for a complex pseudocyst/abscess.  1.5 x 2.7 cm fluid and gas collection anterior to the pancreatic body, new, worrisome for developing complex pseudocyst/abscess.  Large volume abdominopelvic ascites.  Mild peritoneal thickening in the right paracolic gutter, possibly reflecting peritonitis.  Patchy right lower lobe opacity, atelectasis versus pneumonia, with associated small right pleural effusion.   Original Report Authenticated By: Charline Bills, M.D.    Ct Aspiration  08/20/2012  *RADIOLOGY REPORT*  Indication: Place aspirate dominant left-sided abdominal fluid collection and send for cultures.  CT GUIDED ASPIRATIONOF AIR AND FLUID COLLECTION WITHIN THE LEFT UPPER ABDOMINAL QUADRANT  Comparison: CT abdomen pelvis - 08/19/2012; 07/17/2012  Medications: Versed 0.5 mg IV  Contrast: None  Sedation time: 13 minutes  Complications: None immediate  TECHNIQUE/FINDINGS:  Informed consent was obtained from the patient following an explanation of the procedure, risks, benefits and alternatives.  A time out was performed prior to the initiation of the procedure.  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   Redemonstrated are innumerable extraluminal foci of air are within the left upper abdominal quadrant.  The procedure was planned.  The operative site was prepped and draped in the usual sterile fashion.   Appropriate trajectory was confirmed with a 22 gauge spinal needle after the adjacent tissues were anesthetized with 1% Lidocaine with epinephrine.  Under intermittent CT guidance, a 5-French Yueh sheath needle was advanced into the fluid.  Appropriate positioning was confirmed with CT fluoroscopy.  Approximately 30 ml of purulent, slightly bloody fluid was aspirated from the Yueh sheath needle as it was withdrawn.  Postprocedural imaging was obtained demonstrating near resolution of previously noted dominant fluid collection with multiple foci of extraluminal gas again seen within the right upper abdominal quadrant.  Negative for complication.  Hemostasis was achieved with manual compression.  A dressing was placed.  The patient tolerated the procedure well without immediate postprocedural complication.  IMPRESSION:  Technically successful CT guided aspiration of air and fluid containing abscess/pseudocyst within the left upper abdominal quadrant yielding approximately 30 ml of purulent, slightly bloody fluid.  The sample was capped and sent to the laboratory for analysis.   Original Report Authenticated By: Tacey Ruiz, MD     Medications: . aspirin EC  81 mg Oral Daily  . chlorpheniramine-HYDROcodone  5 mL Oral Q12H  . [  START ON 08/25/2012] darbepoetin (ARANESP) injection - DIALYSIS  100 mcg Intravenous Q Thu-HD  . docusate sodium  100 mg Oral BID  . enoxaparin (LOVENOX) injection  30 mg Subcutaneous Q24H  . feeding supplement (NEPRO CARB STEADY)  237 mL Oral BID WC  . ferric gluconate (FERRLECIT/NULECIT) IV  125 mg Intravenous Q T,Th,Sa-HD  . insulin aspart  0-9 Units Subcutaneous TID WC  . methadone  10 mg Oral QID  . metoprolol tartrate  12.5 mg Oral BID  . micafungin (MYCAMINE) IV  100 mg  Intravenous Q24H  . multivitamin  1 tablet Oral QHS  . pantoprazole  40 mg Oral Daily  . piperacillin-tazobactam (ZOSYN)  IV  2.25 g Intravenous Q8H  . sevelamer  400 mg Oral Q breakfast  . simvastatin  10 mg Oral q1800  . vancomycin  500 mg Intravenous Q T,Th,Sa-HD    Assessment/Plan Pancreatitis, chronic Recurrent peripancreatic fluid collections, s/p CT guided aspiration of 30 cc purulent bloody material. Chronic systolic congestive heart failure, NYHA class 3 Hyponatremia - resolved with HD ESRD on hemodialysis Hypertension CAD/ICD Anemia  Plan:  NPO, continue antibiotics, check CBC in AM. Culture from aspiration pending.        LOS: 8 days    JENNINGS,WILLARD 08/21/2012

## 2012-08-21 NOTE — Progress Notes (Signed)
Hypoglycemic Event  CBG: 60  Treatment: 15 GM carbohydrate snack  Symptoms: Nervous/irritable  Follow-up CBG: Time: 1822 CBG Result: 67  Treatment: one tube 15 gram oral glucose  Symptoms: lethargy  Follow-up CBG: Time:1840 CBG Result: 54  Treatment: 1 amp of dextrose  Symptoms: lethargy  Follow-up CBG: Time:1855 CBG Result:208  Possible Reasons for Event: Inadequate meal intake  Comments/MD notified: Evening floor coverage notified.  Floor coverage stated she would come see the patient for her altered mental status despite correction of blood sugar.    [name]  Remember to initiate Hypoglycemia Order Set & complete

## 2012-08-21 NOTE — Progress Notes (Addendum)
[facility] [place] TEAM 1 - Stepdown/ICU TEAM  Patient was seen for a followup visit because her attending physician was unavailable due to acute illness of his own.  At the time of my visit the patient is sitting at the bedside.  She is alert conversant and pleasant.  She denies significant abdominal pain.  She does not wish to undergo a repeat CT scan and says "I just don't think I need it."  After asking her to lay down in bed I examine her abdomen.  It is soft and only mildly tender to deep palpation in the left upper quadrant.  Bowel sounds are present, the abdomen is not rigid, abdomen is not distended, and there is no rebound.  At this time I agree with the patient that repeat CT scan is not currently required.  We will continue to monitor her closely.  I will initiate clear liquid diet.  Lonia Blood, MD [facility] Office  [phone] Pager [phone]  On-Call/Text Page:      [email or web address]      password [facility]

## 2012-08-21 NOTE — Progress Notes (Signed)
[facility] PROGRESS NOTE  Assessment/Plan: *Worsening leukocytosis and fevers due abdominal abscess: - start vanc., micafungin and zosyn,  Ct abdomen and pelvis with contrast: multiple abscess, surgery consulted. - recommended percutaneous drain by  IR 3.8.2014.CT guided aspiration of 30 cc purulent bloody material. - tenderness in the area were needdle was inserted. With mild rebound. - INR is high. Concern for bleeding. - repeat ct scan of abdomen and pelvis.   Chronic systolic congestive heart failure, NYHA class 3 - improved,  Still mildly overloaded. - Received an additional HD treatment  3.6.2014. - metoprolol.     Hyponatremia - resolved with HD    ESRD on hemodialysis - HD TTS. - Received an additional HD treatment  3.6.2014.    Anemia in chronic kidney disease (CKD) - Hemoglobin 7.2 on admission. The patient has received 2 units of PRBCs 08/14/2012.  -  Hemoglobin 8.4, monitor    Pancreatitis, chronic - stable.     Hypertension - BP stable.  Code Status: full code  Family Communication: no family at bedside  Disposition Plan: to SNF when stable     Consultants:  nephrology  Procedures: Echo 3.5.2014: The estimated ejection fraction was in the range of 20% to 25%, Peak RV-RA gradient: 41mm Hg (S).      Antibiotics:  vanc zosyn 3.4.2014- 3.5.2014  levaquin 3.5.2014  HPI/Subjective Abdominal pain, tender abdomen. anorexic   Objective: Filed Vitals:   08/20/12 1749 08/20/12 2129 08/21/12 0056 08/21/12 0900  BP: 112/73 117/77  99/62  Pulse: 85 107  89  Temp: 97.9 F (36.6 C) 100.4 F (38 C) 98.6 F (37 C) 98.2 F (36.8 C)  TempSrc: Oral Oral  Oral  Resp: 18 19  18   Height:      Weight:  52.1 kg (114 lb 13.8 oz)    SpO2: 100% 93%  95%    Intake/Output Summary (Last 24 hours) at 08/21/12 1001 Last data filed at 08/21/12 0900  Gross per 24 hour  Intake    480 ml  Output      0 ml  Net    480 ml   Filed Weights    08/20/12 0400 08/20/12 0825 08/20/12 2129  Weight: 51.8 kg (114 lb 3.2 oz) 50.5 kg (111 lb 5.3 oz) 52.1 kg (114 lb 13.8 oz)    Exam:  General: Alert, awake, oriented x3, in no acute distress.  HEENT: No bruits, no goiter. + JVD Heart: Regular rate and rhythm, without murmurs, rubs, gallops.  Lungs: Good air movement, crackles bilaterally. Abdomen: Soft, Left side pain, swollen. Mild rebound to deep palpation. EXT: lower extremity edema.   Data Reviewed: Basic Metabolic Panel:  Recent Labs Lab 08/15/12 0532 08/16/12 0500 08/17/12 1400 08/18/12 1344 08/20/12 0516 08/21/12 0500  NA 132* 133* 129* 131* 129*  --   K 4.2 3.8 3.1* 3.9 3.8  --   CL 96 95* 77* 94* 93*  --   CO2 23 25 21 27 29   --   GLUCOSE 92 182* 475* 221* 58*  --   BUN 40* 56* 33* 37* 31*  --   CREATININE 2.95* 3.49* 2.23* 2.52* 2.42* 1.68*  CALCIUM 9.6 10.2 8.1* 10.5 11.2*  --   PHOS  --  4.5  --  3.4 3.7  --    Liver Function Tests:  Recent Labs Lab 08/16/12 0500 08/18/12 1344 08/20/12 0516  ALBUMIN 1.7* 1.6* 1.5*   No results found for this basename: LIPASE, AMYLASE,  in the last 168 hours  No results found for this basename: AMMONIA,  in the last 168 hours CBC:  Recent Labs Lab 08/16/12 0500 08/17/12 1400 08/18/12 1344 08/19/12 1021 08/20/12 0500  WBC 14.1* 11.2* 16.4* 13.4* 13.6*  HGB 9.6* 7.6* 9.0* 8.8* 8.4*  HCT 29.8* 24.0* 28.1* 28.9* 27.0*  MCV 90.6 92.0 90.9 94.1 93.8  PLT 236 154 193 PLATELET CLUMPS NOTED ON SMEAR, COUNT APPEARS ADEQUATE 185   Cardiac Enzymes: No results found for this basename: CKTOTAL, CKMB, CKMBINDEX, TROPONINI,  in the last 168 hours BNP (last 3 results)  Recent Labs  04/21/12 1419 05/08/12 0515 06/02/12 1944  PROBNP >70000.0* >70000.0* >70000.0*   CBG:  Recent Labs Lab 08/20/12 1210 08/20/12 1255 08/20/12 1730 08/20/12 2122 08/21/12 0808  GLUCAP 62* 120* 127* 211* 123*    Recent Results (from the past 240 hour(s))  MRSA PCR SCREENING      Status: None   Collection Time    08/14/12  1:24 AM      Result Value Range Status   MRSA by PCR NEGATIVE  NEGATIVE Final   Comment:            The GeneXpert MRSA Assay (FDA     approved for NASAL specimens     only), is one component of a     comprehensive MRSA colonization     surveillance program. It is not     intended to diagnose MRSA     infection nor to guide or     monitor treatment for     MRSA infections.  URINE CULTURE     Status: None   Collection Time    08/14/12  8:06 PM      Result Value Range Status   Specimen Description URINE, CLEAN CATCH   Final   Special Requests NONE   Final   Culture  Setup Time 08/15/2012 01:17   Final   Colony Count >=100,000 COLONIES/ML   Final   Culture     Final   Value: VANCOMYCIN RESISTANT ENTEROCOCCUS ISOLATED     Note: CRITICAL RESULT CALLED TO, READ BACK BY AND VERIFIED WITH: KATHY RICKER @ 0320 ON 08/19/2012   Report Status 08/19/2012 FINAL   Final   Organism ID, Bacteria VANCOMYCIN RESISTANT ENTEROCOCCUS ISOLATED   Final  CULTURE, ROUTINE-ABSCESS     Status: None   Collection Time    08/20/12  2:14 PM      Result Value Range Status   Specimen Description ABSCESS   Final   Special Requests CT GUIDED ASPIRATION OF PANCREATIC PSEUDOCYST   Final   Gram Stain PENDING   Incomplete   Culture NO GROWTH 1 DAY   Final   Report Status PENDING   Incomplete     Studies: Dg Chest 2 View  08/19/2012  *RADIOLOGY REPORT*  Clinical Data: Productive cough with shortness of breath  CHEST - 2 VIEW  Comparison: 08/18/2012  Findings: Right internal jugular dialysis catheter is stable in position. A three lead pacer is in place via a left subclavian approach with lead tips stable and lines appearing intact. The patient is status post median sternotomy and CABG  An interval improvement in lung volumes is noted in comparison with the previous exam.  A stable degree of cardiomegaly is identified. Pulmonary vascular congestion with mild interstitial  edema is again noted.  There has been continued improvement in aeration at the right lung base but residual atelectasis is identified as evidenced by posterior displacement of the right major fissure.  No new areas of focal infiltrate or atelectasis are identified. Bony structures remain intact.  IMPRESSION: Persistent cardiomegaly with mild interstitial edema.  Improved right basilar aeration.   Original Report Authenticated By: Rhodia Albright, M.D.    Ct Abdomen Pelvis W Contrast  08/19/2012  *RADIOLOGY REPORT*  Clinical Data: Abdominal distension, left lower quadrant tenderness  CT ABDOMEN AND PELVIS WITH CONTRAST  Technique:  Multidetector CT imaging of the abdomen and pelvis was performed following the standard protocol during bolus administration of intravenous contrast.  Contrast:  80 ml Omnipaque-300 IV  Comparison: 08/14/2012  Findings: Patchy right lower lobe opacity, atelectasis versus pneumonia.  Mild associated small right pleural effusion.  Mild patchy left basilar opacity, likely atelectasis, with trace left pleural effusion.  Cardiomegaly.  Heterogeneous perfusion of the liver and spleen.  Portal vein is not visualized, possibly related to phase of enhancement, thrombosis not excluded.  Pancreatic atrophy with fluid/scattered foci of gas along the pancreatic tail and splenic hilum.  3.3 x 1.9 cm fluid and gas collection along the left lateral abdominal wall (series 2/image 21), suspicious for complex pseudocyst/abscess, mildly increased (previously 2.0 x 1.0 cm).  1.5 x 2.7 cm fluid and gas collection anterior to the proximal pancreatic body (series 2/image 19), new, suspicious for developing complex pseudocyst/abscess.  Adrenal glands are unremarkable.  Status post cholecystectomy.  Kidneys are notable for renal atrophy and bilateral renal cysts.  No evidence of bowel obstruction.  Large volume abdominopelvic ascites.  Mild peritoneal thickening in the right paracolic gutter (series 2/image 28),  possibly reflecting peritonitis.  Pelvic organs are poorly visualized due to ascites and poor soft tissues contrast.  Body wall edema / anasarca.  Degenerative changes of the visualized thoracolumbar spine.  IMPRESSION: Pancreatic atrophy with fluid/scattered foci of gas along the pancreatic tail and splenic hilum.  3.3 x 1.9 cm fluid and gas collection along the left lateral abdominal wall, increased, suspicious for a complex pseudocyst/abscess.  1.5 x 2.7 cm fluid and gas collection anterior to the pancreatic body, new, worrisome for developing complex pseudocyst/abscess.  Large volume abdominopelvic ascites.  Mild peritoneal thickening in the right paracolic gutter, possibly reflecting peritonitis.  Patchy right lower lobe opacity, atelectasis versus pneumonia, with associated small right pleural effusion.   Original Report Authenticated By: Charline Bills, M.D.    Ct Aspiration  08/20/2012  *RADIOLOGY REPORT*  Indication: Place aspirate dominant left-sided abdominal fluid collection and send for cultures.  CT GUIDED ASPIRATIONOF AIR AND FLUID COLLECTION WITHIN THE LEFT UPPER ABDOMINAL QUADRANT  Comparison: CT abdomen pelvis - 08/19/2012; 07/17/2012  Medications: Versed 0.5 mg IV  Contrast: None  Sedation time: 13 minutes  Complications: None immediate  TECHNIQUE/FINDINGS:  Informed consent was obtained from the patient following an explanation of the procedure, risks, benefits and alternatives.  A time out was performed prior to the initiation of the procedure.  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  Redemonstrated are innumerable extraluminal foci of air are within the left upper abdominal quadrant.  The procedure was planned.  The operative site was prepped and draped  in the usual sterile fashion.   Appropriate trajectory was confirmed with a 22 gauge spinal needle after the adjacent tissues were anesthetized with 1% Lidocaine with epinephrine.  Under intermittent CT guidance, a 5-French Yueh sheath needle  was advanced into the fluid.  Appropriate positioning was confirmed with CT fluoroscopy.  Approximately 30 ml of purulent, slightly bloody fluid was aspirated from the Yueh sheath needle as it was withdrawn.  Postprocedural imaging was obtained demonstrating near resolution of previously noted dominant fluid collection with multiple foci of extraluminal gas again seen within the right upper abdominal quadrant.  Negative for complication.  Hemostasis was achieved with manual compression.  A dressing was placed.  The patient tolerated the procedure well without immediate postprocedural complication.  IMPRESSION:  Technically successful CT guided aspiration of air and fluid containing abscess/pseudocyst within the left upper abdominal quadrant yielding approximately 30 ml of purulent, slightly bloody fluid.  The sample was capped and sent to the laboratory for analysis.   Original Report Authenticated By: Tacey Ruiz, MD     Scheduled Meds: . aspirin EC  81 mg Oral Daily  . chlorpheniramine-HYDROcodone  5 mL Oral Q12H  . [START ON 08/25/2012] darbepoetin (ARANESP) injection - DIALYSIS  100 mcg Intravenous Q Thu-HD  . docusate sodium  100 mg Oral BID  . enoxaparin (LOVENOX) injection  30 mg Subcutaneous Q24H  . feeding supplement (NEPRO CARB STEADY)  237 mL Oral BID WC  . ferric gluconate (FERRLECIT/NULECIT) IV  125 mg Intravenous Q T,Th,Sa-HD  . insulin aspart  0-9 Units Subcutaneous TID WC  . methadone  10 mg Oral QID  . metoprolol tartrate  12.5 mg Oral BID  . micafungin (MYCAMINE) IV  100 mg Intravenous Q24H  . multivitamin  1 tablet Oral QHS  . pantoprazole  40 mg Oral Daily  . piperacillin-tazobactam (ZOSYN)  IV  2.25 g Intravenous Q8H  . sevelamer  400 mg  Oral Q breakfast  . simvastatin  10 mg Oral q1800  . vancomycin  500 mg Intravenous Q T,Th,Sa-HD   Continuous Infusions:    Marinda Elk  [facility] Pager [phone].  If 8PM-8AM, please contact night-coverage at [email or web address], password [facility] 08/21/2012, 10:01 AM  LOS: 8 days

## 2012-08-21 NOTE — Progress Notes (Signed)
BP 94/61 HR 81, spoke with R. Reidler, PA with [facility]; will hold tonight's dose of Lopressor per order. Steele Berg RN

## 2012-08-21 NOTE — Progress Notes (Signed)
Patient interviewed and examined, agree with PA note above.  Mariella Saa MD, FACS  08/21/2012 11:54 AM

## 2012-08-21 NOTE — Progress Notes (Signed)
[facility] Progress Note  Subjective:   Frustrated today. States she cried most of the night due to increased abdominal pain.   Objective Filed Vitals:   08/20/12 1749 08/20/12 2129 08/21/12 0056 08/21/12 0900  BP: 112/73 117/77  99/62  Pulse: 85 107  89  Temp: 97.9 F (36.6 C) 100.4 F (38 C) 98.6 F (37 C) 98.2 F (36.8 C)  TempSrc: Oral Oral  Oral  Resp: 18 19  18   Height:      Weight:  52.1 kg (114 lb 13.8 oz)    SpO2: 100% 93%  95%   Physical Exam General: Chronically ill-appearing, NAD Heart: RRR, AIDC left chest Lungs: Coarse rhonchi bilaterally. + dry cough Abdomen: Soft, trace distension, tender to palp in LUQ near site of aspiration. Normal BS Extremities: 2+ Edema Dialysis Access: Rt I-J  Dialysis Orders: Center: [facility] on TTS.  EDW 53.5 kg HD Bath 3K/2.5Ca Time 4hrs Heparin 1600 U. Access Right IJ catheter BFR 400 DFR 800 Hectorol 0 mcg IV/HD Epogen 1500 Units IV/HD Venofer 0  Assessment/Plan: 1. SOB/presumed HCAP/right pleural effusion; On Vanc and zoysn. Had extra HD treatment Wed. Post HD CXR with minimal improvement in right lung aeration. Febrile overnight. BC's pending.  2. Chronic pancreatitis/Abdominal pain - Recurrent peripancreatic fluid collections. CT abdomen/pelvis showed large volume ascites and new area suspicious for developing pseudocyst/abscess. S/p CT guided aspiration of 30 cc purulent bloody material. Cultures pending. On zosyn and Mycafungin. Repeat CT scheduled for today. Pt is at high risk for any surgical intervention and does not wish to consider.  3. ESRD - TTS HD; chronic hyponatremia secondary to cirrhosis. K+ 3.8 - Continue 4K+ bath for now. Next HD Tuesday. 4. Anemia - Hgb 8.4 s/p 2 units on 3/2; Monitor Hgb and transfuse prn. Aranesp 100. Tsat 22% and Ferritin 1513 on 2/20. IV fe. 5. Secondary hyperparathyroidism - P controlled; no hectorol; corrected Ca remains elevated; Low Ca bath. Renvela.  6. HTN/volume/ascities -   SBPs 99-110's on metoprolol 12.5 BID . Under EDW. LE edema persists. Will need new EDW that also accounts for any large volume fluid aspirations from abdomen during this admission. 7. Nutrition - Albumin 1.5. (Has PC) NPO for now. Reg diet with restriction of K+ and fluids when appropriate. 8. Chronic systolic CHF, NYHA class 3 - per primary    Clydie Braun E. Thad Ranger [facility] Pager [phone] 08/21/2012,11:44 AM  LOS: 8 days    Additional Objective Labs: Basic Metabolic Panel:  Recent Labs Lab 08/16/12 0500 08/17/12 1400 08/18/12 1344 08/20/12 0516 08/21/12 0500  NA 133* 129* 131* 129*  --   K 3.8 3.1* 3.9 3.8  --   CL 95* 77* 94* 93*  --   CO2 25 21 27 29   --   GLUCOSE 182* 475* 221* 58*  --   BUN 56* 33* 37* 31*  --   CREATININE 3.49* 2.23* 2.52* 2.42* 1.68*  CALCIUM 10.2 8.1* 10.5 11.2*  --   PHOS 4.5  --  3.4 3.7  --    Liver Function Tests:  Recent Labs Lab 08/16/12 0500 08/18/12 1344 08/20/12 0516  ALBUMIN 1.7* 1.6* 1.5*   CBC:  Recent Labs Lab 08/16/12 0500 08/17/12 1400 08/18/12 1344 08/19/12 1021 08/20/12 0500  WBC 14.1* 11.2* 16.4* 13.4* 13.6*  HGB 9.6* 7.6* 9.0* 8.8* 8.4*  HCT 29.8* 24.0* 28.1* 28.9* 27.0*  MCV 90.6 92.0 90.9 94.1 93.8  PLT 236 154 193 PLATELET CLUMPS NOTED ON SMEAR, COUNT APPEARS  ADEQUATE 185   Blood Culture    Component Value Date/Time   SDES ABSCESS 08/20/2012 1414   SPECREQUEST CT GUIDED ASPIRATION OF PANCREATIC PSEUDOCYST 08/20/2012 1414   CULT NO GROWTH 1 DAY 08/20/2012 1414   REPTSTATUS PENDING 08/20/2012 1414    CBG:  Recent Labs Lab 08/20/12 1210 08/20/12 1255 08/20/12 1730 08/20/12 2122 08/21/12 0808  GLUCAP 62* 120* 127* 211* 123*   Studies/Results: Dg Chest 2 View  08/19/2012  *RADIOLOGY REPORT*  Clinical Data: Productive cough with shortness of breath  CHEST - 2 VIEW  Comparison: 08/18/2012  Findings: Right internal jugular dialysis catheter is stable in position. A three lead pacer is in place  via a left subclavian approach with lead tips stable and lines appearing intact. The patient is status post median sternotomy and CABG  An interval improvement in lung volumes is noted in comparison with the previous exam.  A stable degree of cardiomegaly is identified. Pulmonary vascular congestion with mild interstitial edema is again noted.  There has been continued improvement in aeration at the right lung base but residual atelectasis is identified as evidenced by posterior displacement of the right major fissure.  No new areas of focal infiltrate or atelectasis are identified. Bony structures remain intact.  IMPRESSION: Persistent cardiomegaly with mild interstitial edema.  Improved right basilar aeration.   Original Report Authenticated By: Rhodia Albright, M.D.    Ct Abdomen Pelvis W Contrast  08/19/2012  *RADIOLOGY REPORT*  Clinical Data: Abdominal distension, left lower quadrant tenderness  CT ABDOMEN AND PELVIS WITH CONTRAST  Technique:  Multidetector CT imaging of the abdomen and pelvis was performed following the standard protocol during bolus administration of intravenous contrast.  Contrast:  80 ml Omnipaque-300 IV  Comparison: 08/14/2012  Findings: Patchy right lower lobe opacity, atelectasis versus pneumonia.  Mild associated small right pleural effusion.  Mild patchy left basilar opacity, likely atelectasis, with trace left pleural effusion.  Cardiomegaly.  Heterogeneous perfusion of the liver and spleen.  Portal vein is not visualized, possibly related to phase of enhancement, thrombosis not excluded.  Pancreatic atrophy with fluid/scattered foci of gas along the pancreatic tail and splenic hilum.  3.3 x 1.9 cm fluid and gas collection along the left lateral abdominal wall (series 2/image 21), suspicious for complex pseudocyst/abscess, mildly increased (previously 2.0 x 1.0 cm).  1.5 x 2.7 cm fluid and gas collection anterior to the proximal pancreatic body (series 2/image 19), new, suspicious  for developing complex pseudocyst/abscess.  Adrenal glands are unremarkable.  Status post cholecystectomy.  Kidneys are notable for renal atrophy and bilateral renal cysts.  No evidence of bowel obstruction.  Large volume abdominopelvic ascites.  Mild peritoneal thickening in the right paracolic gutter (series 2/image 28), possibly reflecting peritonitis.  Pelvic organs are poorly visualized due to ascites and poor soft tissues contrast.  Body wall edema / anasarca.  Degenerative changes of the visualized thoracolumbar spine.  IMPRESSION: Pancreatic atrophy with fluid/scattered foci of gas along the pancreatic tail and splenic hilum.  3.3 x 1.9 cm fluid and gas collection along the left lateral abdominal wall, increased, suspicious for a complex pseudocyst/abscess.  1.5 x 2.7 cm fluid and gas collection anterior to the pancreatic body, new, worrisome for developing complex pseudocyst/abscess.  Large volume abdominopelvic ascites.  Mild peritoneal thickening in the right paracolic gutter, possibly reflecting peritonitis.  Patchy right lower lobe opacity, atelectasis versus pneumonia, with associated small right pleural effusion.   Original Report Authenticated By: Charline Bills, M.D.    Ct Aspiration  08/20/2012  *RADIOLOGY REPORT*  Indication: Place aspirate dominant left-sided abdominal fluid collection and send for cultures.  CT GUIDED ASPIRATIONOF AIR AND FLUID COLLECTION WITHIN THE LEFT UPPER ABDOMINAL QUADRANT  Comparison: CT abdomen pelvis - 08/19/2012; 07/17/2012  Medications: Versed 0.5 mg IV  Contrast: None  Sedation time: 13 minutes  Complications: None immediate  TECHNIQUE/FINDINGS:  Informed consent was obtained from the patient following an explanation of the procedure, risks, benefits and alternatives.  A time out was performed prior to the initiation of the procedure.  The patient was positioned supine on the CT table and a limited CT was performed for procedural planning demonstrating grossly  unchanged appearance approximately 3.6 x 1.4 cm air and fluid containing abscess/cirrhosis within the left upper abdominal quadrant (image 11, series 3).  Redemonstrated are innumerable extraluminal foci of air are within the left upper abdominal quadrant.  The procedure was planned.  The operative site was prepped and draped in the usual sterile fashion.   Appropriate trajectory was confirmed with a 22 gauge spinal needle after the adjacent tissues were anesthetized with 1% Lidocaine with epinephrine.  Under intermittent CT guidance, a 5-French Yueh sheath needle was advanced into the fluid.  Appropriate positioning was confirmed with CT fluoroscopy.  Approximately 30 ml of purulent, slightly bloody fluid was aspirated from the Yueh sheath needle as it was withdrawn.  Postprocedural imaging was obtained demonstrating near resolution of previously noted dominant fluid collection with multiple foci of extraluminal gas again seen within the right upper abdominal quadrant.  Negative for complication.  Hemostasis was achieved with manual compression.  A dressing was placed.  The patient tolerated the procedure well without immediate postprocedural complication.  IMPRESSION:  Technically successful CT guided aspiration of air and fluid containing abscess/pseudocyst within the left upper abdominal quadrant yielding approximately 30 ml of purulent, slightly bloody fluid.  The sample was capped and sent to the laboratory for analysis.   Original Report Authenticated By: Tacey Ruiz, MD    Medications:   . aspirin EC  81 mg Oral Daily  . chlorpheniramine-HYDROcodone  5 mL Oral Q12H  . [START ON 08/25/2012] darbepoetin (ARANESP) injection - DIALYSIS  100 mcg Intravenous Q Thu-HD  . docusate sodium  100 mg Oral BID  . enoxaparin (LOVENOX) injection  30 mg Subcutaneous Q24H  . feeding supplement (NEPRO CARB STEADY)  237 mL Oral BID WC  . ferric gluconate (FERRLECIT/NULECIT) IV  125 mg Intravenous Q T,Th,Sa-HD  .  insulin aspart  0-9 Units Subcutaneous TID WC  . methadone  10 mg Oral QID  . metoprolol tartrate  12.5 mg Oral BID  . micafungin (MYCAMINE) IV  100 mg Intravenous Q24H  . multivitamin  1 tablet Oral QHS  . pantoprazole  40 mg Oral Daily  . piperacillin-tazobactam (ZOSYN)  IV  2.25 g Intravenous Q8H  . sevelamer  400 mg Oral Q breakfast  . simvastatin  10 mg Oral q1800  . vancomycin  500 mg Intravenous Q T,Th,Sa-HD   I have seen and examined this patient and agree with plan as outlined above.  Has some pain at needle site but no rebound or guarding.  Agree with observation/abx and await cultures.  Advance diet as tolerated per primary svc and surgery. Gleb Mcguire A,MD 08/21/2012 12:30 PM

## 2012-08-22 DIAGNOSIS — N186 End stage renal disease: Secondary | ICD-10-CM

## 2012-08-22 DIAGNOSIS — D72829 Elevated white blood cell count, unspecified: Secondary | ICD-10-CM

## 2012-08-22 DIAGNOSIS — I5023 Acute on chronic systolic (congestive) heart failure: Secondary | ICD-10-CM

## 2012-08-22 LAB — IRON AND TIBC
Iron: 18 ug/dL — ABNORMAL LOW (ref 42–135)
Saturation Ratios: 13 % — ABNORMAL LOW (ref 20–55)
TIBC: 140 ug/dL — ABNORMAL LOW (ref 250–470)
UIBC: 122 ug/dL — ABNORMAL LOW (ref 125–400)

## 2012-08-22 LAB — COMPREHENSIVE METABOLIC PANEL
Albumin: 1.5 g/dL — ABNORMAL LOW (ref 3.5–5.2)
BUN: 31 mg/dL — ABNORMAL HIGH (ref 6–23)
Creatinine, Ser: 2.37 mg/dL — ABNORMAL HIGH (ref 0.50–1.10)
Potassium: 4.3 mEq/L (ref 3.5–5.1)
Total Protein: 6.6 g/dL (ref 6.0–8.3)

## 2012-08-22 LAB — CBC
MCH: 29 pg (ref 26.0–34.0)
MCV: 94 fL (ref 78.0–100.0)
Platelets: 182 10*3/uL (ref 150–400)
RDW: 17.8 % — ABNORMAL HIGH (ref 11.5–15.5)
WBC: 14.8 10*3/uL — ABNORMAL HIGH (ref 4.0–10.5)

## 2012-08-22 LAB — GLUCOSE, CAPILLARY: Glucose-Capillary: 98 mg/dL (ref 70–99)

## 2012-08-22 MED ORDER — CINACALCET HCL 30 MG PO TABS
30.0000 mg | ORAL_TABLET | Freq: Every day | ORAL | Status: DC
  Administered 2012-08-22 – 2012-08-27 (×6): 30 mg via ORAL
  Filled 2012-08-22 (×8): qty 1

## 2012-08-22 MED ORDER — HYDROMORPHONE HCL PF 1 MG/ML IJ SOLN
INTRAMUSCULAR | Status: AC
  Filled 2012-08-22: qty 1

## 2012-08-22 NOTE — Progress Notes (Signed)
[facility] Progress Note  Subjective:   No c/o  Objective Filed Vitals:   08/21/12 1340 08/21/12 2136 08/22/12 0510 08/22/12 0925  BP: 107/70 94/61 104/73 101/69  Pulse: 75 81 102 89  Temp: 97.8 F (36.6 C) 99 F (37.2 C) 99.6 F (37.6 C) 98 F (36.7 C)  TempSrc: Oral Oral Oral   Resp: 18 17 16 16   Height:      Weight:  53.7 kg (118 lb 6.2 oz)    SpO2: 99% 97% 95% 99%   Physical Exam General: Very talkative, eating oatmeal Heart: RRR Lungs: diminished bases with rare crackles Abdomen: soft tender throughout L abdomen/flank Extremities: 2 - 3 + edema to thighs Dialysis Access: right I-J  Dialysis Orders: Center: [facility] on TTS.  EDW 53.5 kg HD Bath 3K/2.5Ca Time 4hrs Heparin 1600 U. Access Right IJ catheter BFR 400 DFR 800 Hectorol 0 mcg IV/HD Epogen 1500 Units IV/HD Venofer 0   Assessment/Plan:  1. SOB/presumed HCAP/right pleural effusion/ fevers; On Vanc, zosyn and micofungin 2. Chronic pancreatitis/Abdominal pain - Recurrent peripancreatic fluid collections. CT abdomen/pelvis showed large volume ascites and new area suspicious for developing pseudocyst/abscess. S/p CT guided aspiration of 30 cc purulent bloody material. Cultures Gram neg rods;  On zosyn and Mycafungin. Pt is at high risk for any surgical intervention and does not wish to consider.  3. ESRD - TTS HD; chronic hyponatremia secondary to cirrhosis. K 4.3 - Use 2 K 2 Ca bath - alt HD and UF for volume today; Need to keep K up to be able to use 2 Ca bath - Extra treatment today for volume 4. Anemia - Hgb 8.7 s/p 2 units on 3/2; . Aranesp 100. Tsat 22% and Ferritin 1513 on 2/20. IV fe. 5. Secondary hyperparathyroidism / hypercalcemia - P controlled; no hectorol; corrected Ca 13. Low Ca bath. Renvela.- add sensipar 30  6. HTN/volume/ascities - SBPs 99-110's on metoprolol 12.5 BID . Under EDW. LE edema persists. Will need new EDW that also accounts for any large volume fluid aspirations from abdomen during  this admission - Use albumin today to help with volume removal 7. Nutrition - Albumin 1.5. (Has PC) . Reg diet  With fluid restrition 8. Chronic systolic CHF, NYHA class 3 - per primary also severe MR from last echo done in Mar '14  Sheffield Slider, PA-C [facility] Beeper [phone] 08/22/2012,11:37 AM  LOS: 9 days   Patient seen and examined.  I agree with plan as above with additions as indicated. Vinson Moselle  MD [phone] pgr    [phone] cell 08/22/2012, 1:03 PM    Additional Objective Labs: Basic Metabolic Panel:  Recent Labs Lab 08/16/12 0500  08/18/12 1344 08/20/12 0516 08/21/12 0500 08/22/12 0655  NA 133*  < > 131* 129*  --  130*  K 3.8  < > 3.9 3.8  --  4.3  CL 95*  < > 94* 93*  --  94*  CO2 25  < > 27 29  --  27  GLUCOSE 182*  < > 221* 58*  --  99  BUN 56*  < > 37* 31*  --  31*  CREATININE 3.49*  < > 2.52* 2.42* 1.68* 2.37*  CALCIUM 10.2  < > 10.5 11.2*  --  11.0*  PHOS 4.5  --  3.4 3.7  --   --   < > = values in this interval not displayed. Liver Function Tests:  Recent Labs Lab 08/18/12 1344 08/20/12  0516 08/22/12 0655  AST  --   --  17  ALT  --   --  10  ALKPHOS  --   --  160*  BILITOT  --   --  0.4  PROT  --   --  6.6  ALBUMIN 1.6* 1.5* 1.5*   CBC:  Recent Labs Lab 08/17/12 1400 08/18/12 1344 08/19/12 1021 08/20/12 0500 08/22/12 0655  WBC 11.2* 16.4* 13.4* 13.6* 14.8*  HGB 7.6* 9.0* 8.8* 8.4* 8.7*  HCT 24.0* 28.1* 28.9* 27.0* 28.2*  MCV 92.0 90.9 94.1 93.8 94.0  PLT 154 193 PLATELET CLUMPS NOTED ON SMEAR, COUNT APPEARS ADEQUATE 185 182   Blood Culture    Component Value Date/Time   SDES BLOOD LEFT HAND 08/20/2012 2200   SPECREQUEST BOTTLES DRAWN AEROBIC AND ANAEROBIC 10CC EACH 08/20/2012 2200   CULT        BLOOD CULTURE RECEIVED NO GROWTH TO DATE CULTURE WILL BE HELD FOR 5 DAYS BEFORE ISSUING A FINAL NEGATIVE REPORT 08/20/2012 2200   REPTSTATUS PENDING 08/20/2012 2200   CBG:  Recent Labs Lab 08/21/12 1822  08/21/12 1840 08/21/12 1855 08/21/12 2132 08/22/12 0745  GLUCAP 67* 54* 208* 118* 98  Studies/Results: Ct Aspiration  08/20/2012  *RADIOLOGY REPORT*  Indication: Place aspirate dominant left-sided abdominal fluid collection and send for cultures.  CT GUIDED ASPIRATIONOF AIR AND FLUID COLLECTION WITHIN THE LEFT UPPER ABDOMINAL QUADRANT  Comparison: CT abdomen pelvis - 08/19/2012; 07/17/2012  Medications: Versed 0.5 mg IV  Contrast: None  Sedation time: 13 minutes  Complications: None immediate  TECHNIQUE/FINDINGS:  Informed consent was obtained from the patient following an explanation of the procedure, risks, benefits and alternatives.  A time out was performed prior to the initiation of the procedure.  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  Redemonstrated are innumerable extraluminal foci of air are within the left upper abdominal quadrant.  The procedure was planned.  The operative site was prepped and draped in the usual sterile fashion.   Appropriate trajectory was confirmed with a 22 gauge spinal needle after the adjacent tissues were anesthetized with 1% Lidocaine with epinephrine.  Under intermittent CT guidance, a 5-French Yueh sheath needle was advanced into the fluid.  Appropriate positioning was confirmed with CT fluoroscopy.  Approximately 30 ml of purulent, slightly bloody fluid was aspirated from the Yueh sheath needle as it was withdrawn.  Postprocedural imaging was obtained demonstrating near resolution of previously noted dominant fluid collection with multiple foci of extraluminal gas again seen within the right upper abdominal quadrant.  Negative for complication.  Hemostasis was achieved with manual compression.  A dressing was placed.  The patient  tolerated the procedure well without immediate postprocedural complication.  IMPRESSION:  Technically successful CT guided aspiration of air and fluid containing abscess/pseudocyst within the left upper abdominal quadrant yielding approximately 30 ml of purulent, slightly bloody fluid.  The sample was capped and sent to the laboratory for analysis.   Original Report Authenticated By: Tacey Ruiz, MD    Medications:   . aspirin EC  81 mg Oral Daily  . chlorpheniramine-HYDROcodone  5 mL Oral Q12H  . [START ON 08/25/2012] darbepoetin (ARANESP) injection - DIALYSIS  100 mcg Intravenous Q Thu-HD  . docusate sodium  100 mg Oral BID  . feeding supplement (  NEPRO CARB STEADY)  237 mL Oral BID WC  . ferric gluconate (FERRLECIT/NULECIT) IV  125 mg Intravenous Q T,Th,Sa-HD  . insulin aspart  0-9 Units Subcutaneous TID WC  . methadone  10 mg Oral QID  . metoprolol tartrate  12.5 mg Oral BID  . micafungin (MYCAMINE) IV  100 mg Intravenous Q24H  . multivitamin  1 tablet Oral QHS  . pantoprazole  40 mg Oral Daily  . piperacillin-tazobactam (ZOSYN)  IV  2.25 g Intravenous Q8H  . sevelamer  400 mg Oral Q breakfast  . simvastatin  10 mg Oral q1800  . vancomycin  500 mg Intravenous Q T,Th,Sa-HD

## 2012-08-22 NOTE — Progress Notes (Signed)
Inpatient Diabetes Program Recommendations  AACE/ADA: New Consensus Statement on Inpatient Glycemic Control (2013)  Target Ranges:  Prepandial:   less than 140 mg/dL      Peak postprandial:   less than 180 mg/dL (1-2 hours)      Critically ill patients:  140 - 180 mg/dL    Results for [name], [name] (MRN [id number]) as of 08/22/2012 11:04  Ref. Range 08/21/2012 08:08 08/21/2012 12:29 08/21/2012 17:31 08/21/2012 18:22 08/21/2012 18:40 08/21/2012 18:55 08/21/2012 21:32  Glucose-Capillary Latest Range: 70-99 mg/dL 409 (H) 811 (H) 60 (L) 67 (L) 54 (L) 208 (H) 118 (H)   Patient with hypoglycemia yesterday at supper time after receiving 2 units of Novolog SSI at lunch time for CBG of 165 mg/dl.  MD- Please d/c Novolog Sensitive correction scale (SSI) from [facility].   Will follow. Ambrose Finland RN, MSN, CDE Diabetes Coordinator Inpatient Diabetes Program [phone]

## 2012-08-22 NOTE — Progress Notes (Signed)
[facility] PROGRESS NOTE  Assessment/Plan: *Worsening leukocytosis and fevers due abdominal abscess: - start vanc., micafungin and zosyn. A febrile. - CT guided aspiration of 30 cc purulent bloody material. - INR is high.    Chronic systolic congestive heart failure, NYHA class 3 - improved,  Still mildly overloaded. - Received an additional HD treatment  3.6.2014. - metoprolol.     Hyponatremia - resolved with HD    ESRD on hemodialysis - HD TTS. - Received an additional HD treatment  3.6.2014.    Anemia in chronic kidney disease (CKD) - Hemoglobin 7.2 on admission. The patient has received 2 units of PRBCs 08/14/2012.  -  Hemoglobin 8.4, monitor    Pancreatitis, chronic - stable.     Hypertension - BP stable.  Code Status: full code  Family Communication: no family at bedside  Disposition Plan: to SNF when stable     Consultants:  nephrology  Procedures: Echo 3.5.2014: The estimated ejection fraction was in the range of 20% to 25%, Peak RV-RA gradient: 41mm Hg (S). percutaneous drain by  IR 3.8.2014.     Antibiotics:  vanc zosyn 3.4.2014- 3.5.2014  levaquin 3.5.2014  HPI/Subjective Abdominal pain resolved.    Objective: Filed Vitals:   08/21/12 1340 08/21/12 2136 08/22/12 0510 08/22/12 0925  BP: 107/70 94/61 104/73 101/69  Pulse: 75 81 102 89  Temp: 97.8 F (36.6 C) 99 F (37.2 C) 99.6 F (37.6 C) 98 F (36.7 C)  TempSrc: Oral Oral Oral   Resp: 18 17 16 16   Height:      Weight:  53.7 kg (118 lb 6.2 oz)    SpO2: 99% 97% 95% 99%    Intake/Output Summary (Last 24 hours) at 08/22/12 1208 Last data filed at 08/22/12 0925  Gross per 24 hour  Intake   1080 ml  Output      0 ml  Net   1080 ml   Filed Weights   08/20/12 0825 08/20/12 2129 08/21/12 2136  Weight: 50.5 kg (111 lb 5.3 oz) 52.1 kg (114 lb 13.8 oz) 53.7 kg (118 lb 6.2 oz)    Exam:  General: Alert, awake, oriented x3, in no acute distress.  HEENT: No bruits, no  goiter. + JVD Heart: Regular rate and rhythm, without murmurs, rubs, gallops.  Lungs: Good air movement, crackles bilaterally. Abdomen: Soft, Left side pain, swollen. Mild rebound to deep palpation. EXT: lower extremity edema.   Data Reviewed: Basic Metabolic Panel:  Recent Labs Lab 08/16/12 0500 08/17/12 1400 08/18/12 1344 08/20/12 0516 08/21/12 0500 08/22/12 0655  NA 133* 129* 131* 129*  --  130*  K 3.8 3.1* 3.9 3.8  --  4.3  CL 95* 77* 94* 93*  --  94*  CO2 25 21 27 29   --  27  GLUCOSE 182* 475* 221* 58*  --  99  BUN 56* 33* 37* 31*  --  31*  CREATININE 3.49* 2.23* 2.52* 2.42* 1.68* 2.37*  CALCIUM 10.2 8.1* 10.5 11.2*  --  11.0*  PHOS 4.5  --  3.4 3.7  --   --    Liver Function Tests:  Recent Labs Lab 08/16/12 0500 08/18/12 1344 08/20/12 0516 08/22/12 0655  AST  --   --   --  17  ALT  --   --   --  10  ALKPHOS  --   --   --  160*  BILITOT  --   --   --  0.4  PROT  --   --   --  6.6  ALBUMIN 1.7* 1.6* 1.5* 1.5*   No results found for this basename: LIPASE, AMYLASE,  in the last 168 hours No results found for this basename: AMMONIA,  in the last 168 hours CBC:  Recent Labs Lab 08/17/12 1400 08/18/12 1344 08/19/12 1021 08/20/12 0500 08/22/12 0655  WBC 11.2* 16.4* 13.4* 13.6* 14.8*  HGB 7.6* 9.0* 8.8* 8.4* 8.7*  HCT 24.0* 28.1* 28.9* 27.0* 28.2*  MCV 92.0 90.9 94.1 93.8 94.0  PLT 154 193 PLATELET CLUMPS NOTED ON SMEAR, COUNT APPEARS ADEQUATE 185 182   Cardiac Enzymes: No results found for this basename: CKTOTAL, CKMB, CKMBINDEX, TROPONINI,  in the last 168 hours BNP (last 3 results)  Recent Labs  04/21/12 1419 05/08/12 0515 06/02/12 1944  PROBNP >70000.0* >70000.0* >70000.0*   CBG:  Recent Labs Lab 08/21/12 1822 08/21/12 1840 08/21/12 1855 08/21/12 2132 08/22/12 0745  GLUCAP 67* 54* 208* 118* 98    Recent Results (from the past 240 hour(s))  MRSA PCR SCREENING     Status: None   Collection Time    08/14/12  1:24 AM      Result  Value Range Status   MRSA by PCR NEGATIVE  NEGATIVE Final   Comment:            The GeneXpert MRSA Assay (FDA     approved for NASAL specimens     only), is one component of a     comprehensive MRSA colonization     surveillance program. It is not     intended to diagnose MRSA     infection nor to guide or     monitor treatment for     MRSA infections.  URINE CULTURE     Status: None   Collection Time    08/14/12  8:06 PM      Result Value Range Status   Specimen Description URINE, CLEAN CATCH   Final   Special Requests NONE   Final   Culture  Setup Time 08/15/2012 01:17   Final   Colony Count >=100,000 COLONIES/ML   Final   Culture     Final   Value: VANCOMYCIN RESISTANT ENTEROCOCCUS ISOLATED     Note: CRITICAL RESULT CALLED TO, READ BACK BY AND VERIFIED WITH: KATHY RICKER @ 0320 ON 08/19/2012   Report Status 08/19/2012 FINAL   Final   Organism ID, Bacteria VANCOMYCIN RESISTANT ENTEROCOCCUS ISOLATED   Final  CULTURE, ROUTINE-ABSCESS     Status: None   Collection Time    08/20/12  2:14 PM      Result Value Range Status   Specimen Description ABSCESS   Final   Special Requests CT GUIDED ASPIRATION OF PANCREATIC PSEUDOCYST   Final   Gram Stain     Final   Value: ABUNDANT WBC PRESENT, PREDOMINANTLY PMN     NO SQUAMOUS EPITHELIAL CELLS SEEN     NO ORGANISMS SEEN   Culture MODERATE GRAM NEGATIVE RODS   Final   Report Status PENDING   Incomplete  CULTURE, BLOOD (ROUTINE X 2)     Status: None   Collection Time    08/20/12  9:45 PM      Result Value Range Status   Specimen Description BLOOD LEFT ARM   Final   Special Requests BOTTLES DRAWN AEROBIC AND ANAEROBIC 10CC EACH   Final   Culture  Setup Time 08/21/2012 15:55   Final   Culture     Final   Value:        BLOOD  CULTURE RECEIVED NO GROWTH TO DATE CULTURE WILL BE HELD FOR 5 DAYS BEFORE ISSUING A FINAL NEGATIVE REPORT   Report Status PENDING   Incomplete  CULTURE, BLOOD (ROUTINE X 2)     Status: None   Collection Time     08/20/12 10:00 PM      Result Value Range Status   Specimen Description BLOOD LEFT HAND   Final   Special Requests BOTTLES DRAWN AEROBIC AND ANAEROBIC 10CC EACH   Final   Culture  Setup Time 08/21/2012 15:54   Final   Culture     Final   Value:        BLOOD CULTURE RECEIVED NO GROWTH TO DATE CULTURE WILL BE HELD FOR 5 DAYS BEFORE ISSUING A FINAL NEGATIVE REPORT   Report Status PENDING   Incomplete     Studies: Ct Aspiration  08/20/2012  *RADIOLOGY REPORT*  Indication: Place aspirate dominant left-sided abdominal fluid collection and send for cultures.  CT GUIDED ASPIRATIONOF AIR AND FLUID COLLECTION WITHIN THE LEFT UPPER ABDOMINAL QUADRANT  Comparison: CT abdomen pelvis - 08/19/2012; 07/17/2012  Medications: Versed 0.5 mg IV  Contrast: None  Sedation time: 13 minutes  Complications: None immediate  TECHNIQUE/FINDINGS:  Informed consent was obtained from the patient following an explanation of the procedure, risks, benefits and alternatives.  A time out was performed prior to the initiation of the procedure.  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  Redemonstrated are innumerable extraluminal foci of air are within the left upper abdominal quadrant.  The procedure was planned.  The operative site was prepped and draped in the usual sterile fashion.   Appropriate trajectory was confirmed with a 22 gauge spinal needle after the adjacent tissues were anesthetized with 1% Lidocaine with epinephrine.  Under intermittent CT guidance, a 5-French Yueh sheath needle was advanced into the fluid.  Appropriate positioning was confirmed with CT fluoroscopy.  Approximately 30 ml of purulent, slightly bloody fluid was aspirated from the Yueh sheath needle as it was withdrawn.   Postprocedural imaging was obtained demonstrating near resolution of previously noted dominant fluid collection with multiple foci of extraluminal gas again seen within the right upper abdominal quadrant.  Negative for complication.  Hemostasis was achieved with manual compression.  A dressing was placed.  The patient tolerated the procedure well without immediate postprocedural complication.  IMPRESSION:  Technically successful CT guided aspiration of air and fluid containing abscess/pseudocyst within the left upper abdominal quadrant yielding approximately 30 ml of purulent, slightly bloody fluid.  The sample was capped and sent to the laboratory for analysis.   Original Report Authenticated By: Tacey Ruiz, MD     Scheduled Meds: . aspirin EC  81 mg Oral Daily  . chlorpheniramine-HYDROcodone  5 mL Oral Q12H  . cinacalcet  30 mg Oral Q breakfast  . [START ON 08/25/2012] darbepoetin (ARANESP) injection - DIALYSIS  100 mcg Intravenous Q Thu-HD  . docusate sodium  100 mg Oral BID  . feeding supplement (NEPRO CARB STEADY)  237 mL Oral BID WC  . ferric gluconate (FERRLECIT/NULECIT) IV  125 mg Intravenous Q T,Th,Sa-HD  . insulin aspart  0-9 Units Subcutaneous TID WC  . methadone  10 mg Oral QID  . metoprolol tartrate  12.5 mg Oral BID  . micafungin [facility]) IV  100 mg Intravenous Q24H  . multivitamin  1 tablet Oral QHS  . pantoprazole  40 mg Oral Daily  . piperacillin-tazobactam (ZOSYN)  IV  2.25 g Intravenous Q8H  . sevelamer  400 mg Oral Q breakfast  . simvastatin  10 mg Oral q1800  . vancomycin  500 mg Intravenous Q T,Th,Sa-HD   Continuous Infusions:    Marinda Elk  [facility] Pager [phone].  If 8PM-8AM, please contact night-coverage at [email or web address], password [email or web address] 08/22/2012, 12:08 PM  LOS: 9 days

## 2012-08-22 NOTE — Progress Notes (Signed)
Patient ID: [name], female   DOB: 22-May-1951, 62 y.o.   MRN: [id number]    Subjective: Pt feels ok.  Some slight abdominal pain.  Tolerating diet.  Objective: Vital signs in last 24 hours: Temp:  [97.8 F (36.6 C)-99.6 F (37.6 C)] 99.6 F (37.6 C) (03/10 0510) Pulse Rate:  [75-102] 102 (03/10 0510) Resp:  [16-18] 16 (03/10 0510) BP: (94-107)/(61-73) 104/73 mmHg (03/10 0510) SpO2:  [95 %-99 %] 95 % (03/10 0510) Weight:  [118 lb 6.2 oz (53.7 kg)] 118 lb 6.2 oz (53.7 kg) (03/09 2136) Last BM Date: 08/21/12  Intake/Output from previous day: 03/09 0701 - 03/10 0700 In: 1080 [P.O.:1080] Out: -  Intake/Output this shift:    PE: Abd: soft, tender over aspiration site, +BS, ND  Lab Results:   Recent Labs  08/20/12 0500 08/22/12 0655  WBC 13.6* 14.8*  HGB 8.4* 8.7*  HCT 27.0* 28.2*  PLT 185 182   BMET  Recent Labs  08/20/12 0516 08/21/12 0500 08/22/12 0655  NA 129*  --  130*  K 3.8  --  4.3  CL 93*  --  94*  CO2 29  --  27  GLUCOSE 58*  --  99  BUN 31*  --  31*  CREATININE 2.42* 1.68* 2.37*  CALCIUM 11.2*  --  11.0*   PT/INR  Recent Labs  08/19/12 1648 08/21/12 1902  LABPROT 19.4* 18.9*  INR 1.70* 1.64*   CMP     Component Value Date/Time   NA 130* 08/22/2012 0655   K 4.3 08/22/2012 0655   CL 94* 08/22/2012 0655   CO2 27 08/22/2012 0655   GLUCOSE 99 08/22/2012 0655   BUN 31* 08/22/2012 0655   CREATININE 2.37* 08/22/2012 0655   CALCIUM 11.0* 08/22/2012 0655   PROT 6.6 08/22/2012 0655   ALBUMIN 1.5* 08/22/2012 0655   AST 17 08/22/2012 0655   ALT 10 08/22/2012 0655   ALKPHOS 160* 08/22/2012 0655   BILITOT 0.4 08/22/2012 0655   GFRNONAA 21* 08/22/2012 0655   GFRAA 24* 08/22/2012 0655   Lipase     Component Value Date/Time   LIPASE 24 08/13/2012 2142       Studies/Results: Ct Aspiration  08/20/2012  *RADIOLOGY REPORT*  Indication: Place aspirate dominant left-sided abdominal fluid collection and send for cultures.  CT GUIDED ASPIRATIONOF AIR AND  FLUID COLLECTION WITHIN THE LEFT UPPER ABDOMINAL QUADRANT  Comparison: CT abdomen pelvis - 08/19/2012; 07/17/2012  Medications: Versed 0.5 mg IV  Contrast: None  Sedation time: 13 minutes  Complications: None immediate  TECHNIQUE/FINDINGS:  Informed consent was obtained from the patient following an explanation of the procedure, risks, benefits and alternatives.  A time out was performed prior to the initiation of the procedure.  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  Redemonstrated are innumerable extraluminal foci of air are within the left upper abdominal quadrant.  The procedure was planned.  The operative site was prepped and draped in the usual sterile fashion.   Appropriate trajectory was confirmed with a 22 gauge spinal needle after the adjacent tissues were anesthetized with 1% Lidocaine with epinephrine.  Under intermittent CT guidance, a 5-French Yueh sheath needle was advanced into the fluid.  Appropriate positioning was confirmed with CT fluoroscopy.  Approximately 30 ml of purulent, slightly bloody  fluid was aspirated from the Yueh sheath needle as it was withdrawn.  Postprocedural imaging was obtained demonstrating near resolution of previously noted dominant fluid collection with multiple foci of extraluminal gas again seen within the right upper abdominal quadrant.  Negative for complication.  Hemostasis was achieved with manual compression.  A dressing was placed.  The patient tolerated the procedure well without immediate postprocedural complication.  IMPRESSION:  Technically successful CT guided aspiration of air and fluid containing abscess/pseudocyst within the left upper abdominal quadrant yielding approximately 30 ml of purulent, slightly bloody fluid.   The sample was capped and sent to the laboratory for analysis.   Original Report Authenticated By: Tacey Ruiz, MD     Anti-infectives: Anti-infectives   Start     Dose/Rate Route Frequency Ordered Stop   08/20/12 1200  ceFEPIme (MAXIPIME) 2 g in dextrose 5 % 50 mL IVPB  Status:  Discontinued     2 g 100 mL/hr over 30 Minutes Intravenous Every T-Th-Sa (Hemodialysis) 08/19/12 0736 08/19/12 1614   08/20/12 1200  vancomycin (VANCOCIN) 500 mg in sodium chloride 0.9 % 100 mL IVPB     500 mg 100 mL/hr over 60 Minutes Intravenous Every T-Th-Sa (Hemodialysis) 08/19/12 0746     08/19/12 1800  micafungin (MYCAMINE) 100 mg in sodium chloride 0.9 % 100 mL IVPB     100 mg 100 mL/hr over 1 Hours Intravenous Every 24 hours 08/19/12 1639     08/19/12 1700  piperacillin-tazobactam (ZOSYN) IVPB 2.25 g     2.25 g 100 mL/hr over 30 Minutes Intravenous 3 times per day 08/19/12 1637     08/19/12 1630  voriconazole (VFEND) 210 mg in sodium chloride 0.9 % 100 mL IVPB  Status:  Discontinued     4 mg/kg  51.3 kg 60.5 mL/hr over 120 Minutes Intravenous Every 12 hours 08/19/12 1618 08/19/12 1639   08/19/12 1400  levofloxacin (LEVAQUIN) tablet 500 mg  Status:  Discontinued     500 mg Oral Every 48 hours 08/17/12 1233 08/19/12 1614   08/19/12 0830  ceFEPIme (MAXIPIME) 2 g in dextrose 5 % 50 mL IVPB  Status:  Discontinued     2 g 100 mL/hr over 30 Minutes Intravenous  Once 08/19/12 0736 08/19/12 1625   08/19/12 0830  vancomycin (VANCOCIN) IVPB 1000 mg/200 mL premix     1,000 mg 200 mL/hr over 60 Minutes Intravenous  Once 08/19/12 0746 08/19/12 1727   08/17/12 1400  levofloxacin (LEVAQUIN) tablet 750 mg     750 mg Oral  Once 08/17/12 1233 08/17/12 2218   08/17/12 1230  levofloxacin (LEVAQUIN) tablet 750 mg  Status:  Discontinued     750 mg Oral Every 48 hours 08/17/12 1229 08/17/12 1232   08/16/12 1200  vancomycin (VANCOCIN) 500 mg in sodium chloride 0.9 % 100 mL IVPB  Status:  Discontinued     500 mg 100 mL/hr  over 60 Minutes Intravenous Every T-Th-Sa (Hemodialysis) 08/14/12 0128 08/18/12 0848   08/14/12 2030  piperacillin-tazobactam (ZOSYN) IVPB 2.25 g  Status:  Discontinued     2.25 g 100 mL/hr over 30 Minutes Intravenous Every 8 hours 08/14/12 2010 08/18/12 0848   08/14/12 0800  piperacillin-tazobactam (ZOSYN) IVPB 2.25 g  Status:  Discontinued     2.25 g 100 mL/hr over 30 Minutes Intravenous Every 8 hours 08/14/12 0128 08/14/12 2010   08/14/12 0130  vancomycin (VANCOCIN) 1,250 mg in sodium chloride 0.9 % 250 mL IVPB     1,250 mg 166.7 mL/hr  over 90 Minutes Intravenous  Once 08/14/12 0128 08/14/12 0359   08/14/12 0130  piperacillin-tazobactam (ZOSYN) IVPB 2.25 g     2.25 g 100 mL/hr over 30 Minutes Intravenous  Once 08/14/12 0128 08/14/12 0259       Assessment/Plan  1. Peripancreatic fluid collection, s/p aspiration  Plan: 1. Patient feels well currently.  Agree with diet.  No surgical intervention necessary.   2. Cont abx therapy for other small collection. 3. Treat conservatively.    LOS: 9 days    OSBORNE,KELLY E 08/22/2012, 9:24 AM Pager: [phone]

## 2012-08-22 NOTE — Progress Notes (Signed)
I have seen and examined the patient and agree with above note Pt currently does not want surgery. Con't abx

## 2012-08-22 NOTE — Progress Notes (Signed)
PT Cancellation Note  Patient Details Name: [name] MRN: [id number] DOB: 07/26/1950   Cancelled Treatment:    Reason Eval/Treat Not Completed: Patient at procedure or test/unavailable  Pt in HD   Donnetta Hail 08/22/2012, 2:25 PM

## 2012-08-22 NOTE — Progress Notes (Signed)
ANTIBIOTIC CONSULT NOTE - FOLLOW UP  Pharmacy Consult for Vancomycin + Zosyn Indication: Empiric HCAP and pancreatic pseudocyst/abscess coverage  Allergies  Allergen Reactions  . Codeine Itching  . Morphine And Related Shortness Of Breath    SOB when given IV once "to me it was mild; I called out fast for help; never had to intubate" (05/23/2012)  . Ace Inhibitors Other (See Comments)    Unknown reaction but her physician stated that she can't take it (specifically lisinopril)  . Tylenol (Acetaminophen) Other (See Comments)    Patient states that the doctor told her she has a Fatty Liver.    Patient Measurements: Height: 5\' 4"  (162.6 cm) Weight: 118 lb 6.2 oz (53.7 kg) IBW/kg (Calculated) : 54.7  Vital Signs: Temp: 98 F (36.7 C) (03/10 0925) Temp src: Oral (03/10 0510) BP: 101/69 mmHg (03/10 0925) Pulse Rate: 89 (03/10 0925) Intake/Output from previous day: 03/09 0701 - 03/10 0700 In: 1080 [P.O.:1080] Out: -  Intake/Output from this shift: Total I/O In: 240 [P.O.:240] Out: -   Labs:  Recent Labs  08/20/12 0500 08/20/12 0516 08/21/12 0500 08/22/12 0655  WBC 13.6*  --   --  14.8*  HGB 8.4*  --   --  8.7*  PLT 185  --   --  182  CREATININE  --  2.42* 1.68* 2.37*   Estimated Creatinine Clearance: 20.6 ml/min (by C-G formula based on Cr of 2.37).  Recent Labs  08/20/12 0500  VANCORANDOM 27.3     Microbiology: Recent Results (from the past 720 hour(s))  MRSA PCR SCREENING     Status: None   Collection Time    08/14/12  1:24 AM      Result Value Range Status   MRSA by PCR NEGATIVE  NEGATIVE Final   Comment:            The GeneXpert MRSA Assay (FDA     approved for NASAL specimens     only), is one component of a     comprehensive MRSA colonization     surveillance program. It is not     intended to diagnose MRSA     infection nor to guide or     monitor treatment for     MRSA infections.  URINE CULTURE     Status: None   Collection Time     08/14/12  8:06 PM      Result Value Range Status   Specimen Description URINE, CLEAN CATCH   Final   Special Requests NONE   Final   Culture  Setup Time 08/15/2012 01:17   Final   Colony Count >=100,000 COLONIES/ML   Final   Culture     Final   Value: VANCOMYCIN RESISTANT ENTEROCOCCUS ISOLATED     Note: CRITICAL RESULT CALLED TO, READ BACK BY AND VERIFIED WITH: KATHY RICKER @ 0320 ON 08/19/2012   Report Status 08/19/2012 FINAL   Final   Organism ID, Bacteria VANCOMYCIN RESISTANT ENTEROCOCCUS ISOLATED   Final  CULTURE, ROUTINE-ABSCESS     Status: None   Collection Time    08/20/12  2:14 PM      Result Value Range Status   Specimen Description ABSCESS   Final   Special Requests CT GUIDED ASPIRATION OF PANCREATIC PSEUDOCYST   Final   Gram Stain     Final   Value: ABUNDANT WBC PRESENT, PREDOMINANTLY PMN     NO SQUAMOUS EPITHELIAL CELLS SEEN     NO ORGANISMS SEEN   Culture  MODERATE GRAM NEGATIVE RODS   Final   Report Status PENDING   Incomplete  CULTURE, BLOOD (ROUTINE X 2)     Status: None   Collection Time    08/20/12  9:45 PM      Result Value Range Status   Specimen Description BLOOD LEFT ARM   Final   Special Requests BOTTLES DRAWN AEROBIC AND ANAEROBIC 10CC EACH   Final   Culture  Setup Time 08/21/2012 15:55   Final   Culture     Final   Value:        BLOOD CULTURE RECEIVED NO GROWTH TO DATE CULTURE WILL BE HELD FOR 5 DAYS BEFORE ISSUING A FINAL NEGATIVE REPORT   Report Status PENDING   Incomplete  CULTURE, BLOOD (ROUTINE X 2)     Status: None   Collection Time    08/20/12 10:00 PM      Result Value Range Status   Specimen Description BLOOD LEFT HAND   Final   Special Requests BOTTLES DRAWN AEROBIC AND ANAEROBIC 10CC EACH   Final   Culture  Setup Time 08/21/2012 15:54   Final   Culture     Final   Value:        BLOOD CULTURE RECEIVED NO GROWTH TO DATE CULTURE WILL BE HELD FOR 5 DAYS BEFORE ISSUING A FINAL NEGATIVE REPORT   Report Status PENDING   Incomplete     Anti-infectives   Start     Dose/Rate Route Frequency Ordered Stop   08/20/12 1200  ceFEPIme (MAXIPIME) 2 g in dextrose 5 % 50 mL IVPB  Status:  Discontinued     2 g 100 mL/hr over 30 Minutes Intravenous Every T-Th-Sa (Hemodialysis) 08/19/12 0736 08/19/12 1614   08/20/12 1200  vancomycin (VANCOCIN) 500 mg in sodium chloride 0.9 % 100 mL IVPB     500 mg 100 mL/hr over 60 Minutes Intravenous Every T-Th-Sa (Hemodialysis) 08/19/12 0746     08/19/12 1800  micafungin (MYCAMINE) 100 mg in sodium chloride 0.9 % 100 mL IVPB     100 mg 100 mL/hr over 1 Hours Intravenous Every 24 hours 08/19/12 1639     08/19/12 1700  piperacillin-tazobactam (ZOSYN) IVPB 2.25 g     2.25 g 100 mL/hr over 30 Minutes Intravenous 3 times per day 08/19/12 1637     08/19/12 1630  voriconazole (VFEND) 210 mg in sodium chloride 0.9 % 100 mL IVPB  Status:  Discontinued     4 mg/kg  51.3 kg 60.5 mL/hr over 120 Minutes Intravenous Every 12 hours 08/19/12 1618 08/19/12 1639   08/19/12 1400  levofloxacin (LEVAQUIN) tablet 500 mg  Status:  Discontinued     500 mg Oral Every 48 hours 08/17/12 1233 08/19/12 1614   08/19/12 0830  ceFEPIme (MAXIPIME) 2 g in dextrose 5 % 50 mL IVPB  Status:  Discontinued     2 g 100 mL/hr over 30 Minutes Intravenous  Once 08/19/12 0736 08/19/12 1625   08/19/12 0830  vancomycin (VANCOCIN) IVPB 1000 mg/200 mL premix     1,000 mg 200 mL/hr over 60 Minutes Intravenous  Once 08/19/12 0746 08/19/12 1727   08/17/12 1400  levofloxacin (LEVAQUIN) tablet 750 mg     750 mg Oral  Once 08/17/12 1233 08/17/12 2218   08/17/12 1230  levofloxacin (LEVAQUIN) tablet 750 mg  Status:  Discontinued     750 mg Oral Every 48 hours 08/17/12 1229 08/17/12 1232   08/16/12 1200  vancomycin (VANCOCIN) 500 mg in sodium chloride  0.9 % 100 mL IVPB  Status:  Discontinued     500 mg 100 mL/hr over 60 Minutes Intravenous Every T-Th-Sa (Hemodialysis) 08/14/12 0128 08/18/12 0848   08/14/12 2030  piperacillin-tazobactam (ZOSYN)  IVPB 2.25 g  Status:  Discontinued     2.25 g 100 mL/hr over 30 Minutes Intravenous Every 8 hours 08/14/12 2010 08/18/12 0848   08/14/12 0800  piperacillin-tazobactam (ZOSYN) IVPB 2.25 g  Status:  Discontinued     2.25 g 100 mL/hr over 30 Minutes Intravenous Every 8 hours 08/14/12 0128 08/14/12 2010   08/14/12 0130  vancomycin (VANCOCIN) 1,250 mg in sodium chloride 0.9 % 250 mL IVPB     1,250 mg 166.7 mL/hr over 90 Minutes Intravenous  Once 08/14/12 0128 08/14/12 0359   08/14/12 0130  piperacillin-tazobactam (ZOSYN) IVPB 2.25 g     2.25 g 100 mL/hr over 30 Minutes Intravenous  Once 08/14/12 0128 08/14/12 0259      Assessment: 62 y.o. F on Vancomycin + Zosyn for empiric HCAP coverage -- and along with Micafungin per MD for empiric pancreatic pseudocyst/abscess coverage. A pre-HD Vancomycin level drawn over the weekend for slightly SUPRAtherapeutic (random Vanc 27.3 mcg/ml, goal of 15-25 mcg/ml). Estimated levels should now be within range. Zosyn dose remains appropriate. Abscess cultures thus far are growing GNR -- will continue to monitor this. Doses remain appropriate at this time.  Goal of Therapy:  Pre-HD Vancomycin level of 15-25 mcg/ml Proper antibiotics for infection/cultures adjusted for renal/hepatic function   Plan:  1. Continue Vancomycin 500 mg post HD sessions on T/Th/Sat 2. Continue Zosyn 2.25g IV every 8 hours 3. Will continue to follow HD schedule/duration, culture results, LOT, and antibiotic de-escalation plans   Georgina Pillion, PharmD, BCPS Clinical Pharmacist Pager: [phone] 08/22/2012 1:25 PM

## 2012-08-23 DIAGNOSIS — K859 Acute pancreatitis without necrosis or infection, unspecified: Secondary | ICD-10-CM

## 2012-08-23 LAB — CBC
MCV: 93.7 fL (ref 78.0–100.0)
Platelets: 183 10*3/uL (ref 150–400)
RBC: 3.01 MIL/uL — ABNORMAL LOW (ref 3.87–5.11)
WBC: 17.1 10*3/uL — ABNORMAL HIGH (ref 4.0–10.5)

## 2012-08-23 LAB — RENAL FUNCTION PANEL
Albumin: 1.2 g/dL — ABNORMAL LOW (ref 3.5–5.2)
BUN: 18 mg/dL (ref 6–23)
CO2: 30 mEq/L (ref 19–32)
Calcium: 9.5 mg/dL (ref 8.4–10.5)
Chloride: 98 mEq/L (ref 96–112)
Creatinine, Ser: 1.68 mg/dL — ABNORMAL HIGH (ref 0.50–1.10)
GFR calc Af Amer: 36 mL/min — ABNORMAL LOW (ref >90)
GFR calc non Af Amer: 31 mL/min — ABNORMAL LOW (ref >90)
Glucose, Bld: 59 mg/dL — ABNORMAL LOW (ref 70–99)
Phosphorus: 3.1 mg/dL (ref 2.3–4.6)
Potassium: 3.3 mEq/L — ABNORMAL LOW (ref 3.5–5.1)
Sodium: 135 mEq/L (ref 135–145)

## 2012-08-23 LAB — GLUCOSE, CAPILLARY
Glucose-Capillary: 39 mg/dL — CL (ref 70–99)
Glucose-Capillary: 70 mg/dL (ref 70–99)
Glucose-Capillary: 153 mg/dL — ABNORMAL HIGH (ref 70–99)

## 2012-08-23 MED ORDER — PENTAFLUOROPROP-TETRAFLUOROETH EX AERO: 1.0000 "application " | INHALATION_SPRAY | CUTANEOUS | Status: DC | PRN

## 2012-08-23 MED ORDER — LIDOCAINE-PRILOCAINE 2.5-2.5 % EX CREA: 1.0000 "application " | TOPICAL_CREAM | CUTANEOUS | Status: DC | PRN

## 2012-08-23 MED ORDER — SODIUM CHLORIDE 0.9 % IV SOLN: 100.0000 mL | INTRAVENOUS | Status: DC | PRN

## 2012-08-23 MED ORDER — HEPARIN SODIUM (PORCINE) 1000 UNIT/ML DIALYSIS: 1000.0000 [IU] | INTRAMUSCULAR | Status: DC | PRN

## 2012-08-23 MED ORDER — SEVELAMER HCL 400 MG PO TABS
400.0000 mg | ORAL_TABLET | Freq: Three times a day (TID) | ORAL | Status: DC
  Administered 2012-08-23 – 2012-09-03 (×22): 400 mg via ORAL
  Filled 2012-08-23 (×36): qty 1

## 2012-08-23 MED ORDER — ALTEPLASE 2 MG IJ SOLR
2.0000 mg | Freq: Once | INTRAMUSCULAR | Status: DC | PRN
  Filled 2012-08-23: qty 2

## 2012-08-23 MED ORDER — SODIUM CHLORIDE 0.9 % IV SOLN
500.0000 mg | Freq: Every day | INTRAVENOUS | Status: DC
  Administered 2012-08-24: 500 mg via INTRAVENOUS
  Filled 2012-08-23 (×2): qty 0.5

## 2012-08-23 MED ORDER — ALTEPLASE 2 MG IJ SOLR: 2.0000 mg | Freq: Once | INTRAMUSCULAR | Status: DC | PRN

## 2012-08-23 MED ORDER — HYDROMORPHONE HCL PF 1 MG/ML IJ SOLN
1.0000 mg | INTRAMUSCULAR | Status: DC | PRN
  Administered 2012-08-23 – 2012-08-30 (×8): 1 mg via INTRAVENOUS
  Filled 2012-08-23 (×10): qty 1

## 2012-08-23 MED ORDER — LIDOCAINE HCL (PF) 1 % IJ SOLN: 5.0000 mL | INTRAMUSCULAR | Status: DC | PRN

## 2012-08-23 MED ORDER — NEPRO/CARBSTEADY PO LIQD: 237.0000 mL | ORAL | Status: DC | PRN

## 2012-08-23 MED ORDER — SODIUM CHLORIDE 0.9 % IV SOLN
500.0000 mg | Freq: Once | INTRAVENOUS | Status: AC
  Administered 2012-08-23: 500 mg via INTRAVENOUS
  Filled 2012-08-23: qty 0.5

## 2012-08-23 MED ORDER — ALBUMIN HUMAN 25 % IV SOLN
12.5000 g | Freq: Once | INTRAVENOUS | Status: AC
  Administered 2012-08-23: 12.5 g via INTRAVENOUS

## 2012-08-23 MED ORDER — HEPARIN SODIUM (PORCINE) 1000 UNIT/ML DIALYSIS: 20.0000 [IU]/kg | INTRAMUSCULAR | Status: DC | PRN

## 2012-08-23 NOTE — Progress Notes (Signed)
Physical Therapy Treatment Patient Details Name: [name] MRN: [id number] DOB: 1951-04-11 Today's Date: 08/23/2012 Time: 6295-2841 PT Time Calculation (min): 24 min  PT Assessment / Plan / Recommendation Comments on Treatment Session  progressing however medical issues limiting PT/activity; Pt participates with PT, performs all ther ex with vital signs stable, although requires frequent rest breaks; further activity not initiated today due to significatnly elevated INR;  Pt is certainly deconditioned but desires to go home vs post acute rehab;will continue to follow;     Follow Up Recommendations  Home health PT;Supervision for mobility/OOB;Other (comment)     Does the patient have the potential to tolerate intense rehabilitation     Barriers to Discharge        Equipment Recommendations       Recommendations for Other Services    Frequency Min 3X/week   Plan Discharge plan remains appropriate;Frequency remains appropriate    Precautions / Restrictions Precautions Precautions: Fall Precaution Comments: contact isolation; INR elevated today   Pertinent Vitals/Pain Min c/o pain/soreness right upper quadrant due to IR procedure; NT aware    Mobility       Exercises General Exercises - Lower Extremity Long Arc Quad: AROM;Both;15 reps Hip ABduction/ADduction: AROM;15 reps;Seated Hip Flexion/Marching: Seated;AROM;15 reps Toe Raises: AROM;Both;15 reps Heel Raises: AROM;Both;15 reps Requires increased time and rest breaks   PT Diagnosis:    PT Problem List:   PT Treatment Interventions:     PT Goals Acute Rehab PT Goals Pt will Perform Home Exercise Program: with supervision, verbal cues required/provided PT Goal: Perform Home Exercise Program - Progress: Progressing toward goal  Visit Information  Last PT Received On: 08/23/12 Assistance Needed: +1    Subjective Data  Subjective: I do not feel good today Patient Stated Goal: home after rehab   Cognition  Cognition Overall Cognitive Status: Appears within functional limits for tasks assessed/performed Arousal/Alertness: Awake/alert Orientation Level: Appears intact for tasks assessed Behavior During Session: [facility] for tasks performed    Balance     End of Session PT - End of Session Activity Tolerance: Patient tolerated treatment well Patient left: in chair;with call bell/phone within reach   GP     [facility] 08/23/2012, 2:01 PM

## 2012-08-23 NOTE — Progress Notes (Addendum)
[facility] PROGRESS NOTE Interval History: 62 y.o. 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 She states that she's had worsening shortness of breath for the past 1 to 2 days and missed her dialysis on saturday because of the shortness of breath. She reports that her O2 sat was checked in the facility and was 85 and so she was put on supplemental oxygen. She admits to a nonproductive cough and a R. pleuritic pain, she was initially admitted to the hospital and treated for healthcare associated pneumonia with bandage scissors in. Her condition continued to deteriorate started complaining of left lower quadrant pain. A CT scan of the abdomen and pelvis was done that showed 2 abscess in her pancreas, surgery was consulted they recommended aspiration by IR which was done, she continued to spike fevers and her white count continued to trend up an antibiotic coverage was broadened to vancomycin, micafungin and meropenem and ID was consulted.   She was also volume overloaded, with massive JVD c edema which renal has been dialyzing to try to remove extra fluid. The patient and she is complaining of cough.   Assessment/Plan: *Worsening leukocytosis and fevers due abdominal abscess: - Vanc, micafungin and start meropenem, d/c zosyn. A febrile. Consult ID for antibiotics management. - worsening leukocytosis. Cultures negative till date. - CT guided aspiration of 30 cc purulent bloody material. - INR is high.  - surgery consulted for abscess management.   Chronic systolic congestive heart failure, NYHA class 3 -  Cough secondary to vol overload,  Still mildly overloaded. + JVD and crackles. - Received an additional HD treatment  3.6.2014. - metoprolol.     Hyponatremia - resolved with HD    ESRD on hemodialysis - HD TTS. - Received an additional HD treatment  3.6.2014.    Anemia in chronic kidney disease (CKD) - Hemoglobin 7.2 on admission. The patient has received 2 units of PRBCs 08/14/2012.  -  Hemoglobin 8.4, monitor    Pancreatitis, chronic - stable.     Hypertension - BP stable.  Code Status: full code  Family Communication: no family at bedside  Disposition Plan: to SNF when stable     Consultants:  nephrology  Procedures: Echo 3.5.2014: The estimated ejection fraction was in the range of 20% to 25%, Peak RV-RA gradient: 41mm Hg (S). percutaneous drain by  IR 3.8.2014.     Antibiotics:  vanc zosyn 3.4.2014- 3.5.2014  levaquin 3.5.2014  HPI/Subjective Abdominal pain resolved.   Objective: Filed Vitals:   08/23/12 1008 08/23/12 1023 08/23/12 1036 08/23/12 1056  BP: 98/66 103/52 104/67 101/66  Pulse: 76 77 77 77  Temp:    97.7 F (36.5 C)  TempSrc:    Tympanic  Resp: 17 14 20 21   Height:      Weight:    48.3 kg (106 lb 7.7 oz)  SpO2:   100% 98%    Intake/Output Summary (Last 24 hours) at 08/23/12 1204 Last data filed at 08/23/12 1056  Gross per 24 hour  Intake   1200 ml  Output    149 ml  Net   1051 ml   Filed Weights   08/22/12 2021 08/23/12 0639 08/23/12 1056  Weight: 50.701 kg (111  lb 12.4 oz) 50.1 kg (110 lb 7.2 oz) 48.3 kg (106 lb 7.7 oz)    Exam:  General: Alert, awake, oriented x3, in no acute distress.  HEENT: No bruits, no goiter. + JVD Heart: Regular rate and rhythm, without murmurs, rubs, gallops.  Lungs: Good air movement, crackles bilaterally. Abdomen: Soft, Left side pain, swollen. Mild rebound to deep palpation. EXT: lower extremity edema.   Data Reviewed: Basic Metabolic Panel:  Recent Labs Lab 08/17/12 1400  08/18/12 1344 08/20/12 0516 08/21/12 0500 08/22/12 0655 08/23/12 0642  NA 129* 131* 129*  --  130* 135  K 3.1* 3.9 3.8  --  4.3 3.3*  CL 77* 94* 93*  --  94* 98  CO2 21 27 29   --  27 30  GLUCOSE 475* 221* 58*  --  99 59*  BUN 33* 37* 31*  --  31* 18  CREATININE 2.23* 2.52* 2.42* 1.68* 2.37* 1.68*  CALCIUM 8.1* 10.5 11.2*  --  11.0* 9.5  PHOS  --  3.4 3.7  --   --  3.1   Liver Function Tests:  Recent Labs Lab 08/18/12 1344 08/20/12 0516 08/22/12 0655 08/23/12 0642  AST  --   --  17  --   ALT  --   --  10  --   ALKPHOS  --   --  160*  --   BILITOT  --   --  0.4  --   PROT  --   --  6.6  --   ALBUMIN 1.6* 1.5* 1.5* 1.2*   No results found for this basename: LIPASE, AMYLASE,  in the last 168 hours No results found for this basename: AMMONIA,  in the last 168 hours CBC:  Recent Labs Lab 08/18/12 1344 08/19/12 1021 08/20/12 0500 08/22/12 0655 08/23/12 0455  WBC 16.4* 13.4* 13.6* 14.8* 17.1*  HGB 9.0* 8.8* 8.4* 8.7* 8.7*  HCT 28.1* 28.9* 27.0* 28.2* 28.2*  MCV 90.9 94.1 93.8 94.0 93.7  PLT 193 PLATELET CLUMPS NOTED ON SMEAR, COUNT APPEARS ADEQUATE 185 182 183   Cardiac Enzymes: No results found for this basename: CKTOTAL, CKMB, CKMBINDEX, TROPONINI,  in the last 168 hours BNP (last 3 results)  Recent Labs  04/21/12 1419 05/08/12 0515 06/02/12 1944  PROBNP >70000.0* >70000.0* >70000.0*   CBG:  Recent Labs Lab 08/21/12 1855 08/21/12 2132 08/22/12 0745 08/22/12 1201 08/22/12 2016  GLUCAP 208* 118* 98 197* 90    Recent Results (from the past 240 hour(s))  MRSA PCR SCREENING     Status: None   Collection Time    08/14/12  1:24 AM      Result Value Range Status   MRSA by PCR NEGATIVE  NEGATIVE Final   Comment:            The GeneXpert MRSA Assay (FDA     approved for NASAL specimens     only), is one component of a     comprehensive MRSA colonization     surveillance program. It is not     intended to diagnose MRSA     infection nor to guide or      monitor treatment for     MRSA infections.  URINE CULTURE     Status: None   Collection Time    08/14/12  8:06 PM      Result Value Range Status   Specimen Description URINE, CLEAN CATCH   Final   Special Requests NONE   Final   Culture  Setup Time 08/15/2012 01:17   Final   Colony Count >=100,000 COLONIES/ML   Final   Culture     Final   Value: VANCOMYCIN RESISTANT ENTEROCOCCUS ISOLATED     Note: CRITICAL RESULT CALLED TO, READ BACK BY AND VERIFIED WITH: KATHY RICKER @ 0320 ON 08/19/2012   Report Status 08/19/2012 FINAL   Final   Organism ID, Bacteria VANCOMYCIN RESISTANT ENTEROCOCCUS ISOLATED   Final  CULTURE, ROUTINE-ABSCESS     Status: None   Collection Time    08/20/12  2:14 PM      Result Value Range Status   Specimen Description ABSCESS   Final   Special Requests CT GUIDED ASPIRATION OF PANCREATIC PSEUDOCYST   Final   Gram Stain     Final   Value: ABUNDANT WBC PRESENT, PREDOMINANTLY PMN     NO SQUAMOUS EPITHELIAL CELLS SEEN     NO ORGANISMS SEEN   Culture MODERATE GRAM NEGATIVE RODS   Final   Report Status PENDING   Incomplete  CULTURE, BLOOD (ROUTINE X 2)     Status: None   Collection Time    08/20/12  9:45 PM      Result Value Range Status   Specimen Description BLOOD LEFT ARM   Final   Special Requests BOTTLES DRAWN AEROBIC AND ANAEROBIC 10CC EACH   Final   Culture  Setup Time 08/21/2012 15:55   Final   Culture     Final   Value:        BLOOD CULTURE RECEIVED NO GROWTH TO DATE CULTURE WILL BE HELD FOR 5 DAYS BEFORE ISSUING A FINAL NEGATIVE REPORT   Report Status PENDING   Incomplete  CULTURE, BLOOD (ROUTINE X 2)     Status: None   Collection Time    08/20/12 10:00 PM      Result Value Range Status   Specimen Description BLOOD LEFT HAND   Final   Special Requests BOTTLES DRAWN AEROBIC AND ANAEROBIC 10CC EACH   Final   Culture  Setup Time 08/21/2012 15:54   Final   Culture     Final   Value:        BLOOD CULTURE RECEIVED NO GROWTH TO DATE CULTURE WILL BE HELD  FOR 5 DAYS BEFORE ISSUING A FINAL NEGATIVE REPORT   Report Status PENDING   Incomplete     Studies: No results found.  Scheduled Meds: . aspirin EC  81 mg Oral Daily  . chlorpheniramine-HYDROcodone  5 mL Oral Q12H  . cinacalcet  30 mg Oral Q breakfast  . [START ON 08/25/2012] darbepoetin (ARANESP) injection - DIALYSIS  100 mcg Intravenous Q Thu-HD  . docusate sodium  100 mg Oral BID  . feeding supplement (NEPRO CARB STEADY)  237 mL Oral BID WC  . ferric gluconate (FERRLECIT/NULECIT) IV  125 mg Intravenous Q T,Th,Sa-HD  . insulin aspart  0-9 Units Subcutaneous TID WC  . meropenem (MERREM) IV  500 mg Intravenous Once  . [START ON 08/24/2012] meropenem (MERREM) IV  500 mg Intravenous Q2000  . methadone  10 mg Oral QID  . metoprolol tartrate  12.5 mg Oral BID  . micafungin (MYCAMINE) IV  100 mg Intravenous Q24H  . multivitamin  1 tablet Oral QHS  . pantoprazole  40 mg Oral Daily  . sevelamer  400 mg Oral TID WC  . simvastatin  10 mg Oral q1800  . vancomycin  500 mg Intravenous Q T,Th,Sa-HD   Continuous Infusions:    Marinda Elk  [facility] Pager [phone].  If 8PM-8AM, please contact night-coverage at [email or web address], password [facility] 08/23/2012, 12:04 PM  LOS: 10 days

## 2012-08-23 NOTE — Progress Notes (Signed)
Patient ID: [name], female   DOB: [birth year], 62 y.o.   MRN: [id number]    Subjective: Pt reports coughing significantly worse.  Worsening leukocytosis.  Abdominal pain improved, tolerating regular diet well  Objective: Vital signs in last 24 hours: Temp:  [96.7 F (35.9 C)-100.1 F (37.8 C)] 97.9 F (36.6 C) (03/11 0639) Pulse Rate:  [66-106] 66 (03/11 0740) Resp:  [12-22] 12 (03/11 0740) BP: (100-123)/(48-80) 110/71 mmHg (03/11 0740) SpO2:  [93 %-99 %] 96 % (03/11 0740) Weight:  [110 lb 7.2 oz (50.1 kg)-111 lb 12.4 oz (50.701 kg)] 110 lb 7.2 oz (50.1 kg) (03/11 4098) Last BM Date: 08/22/12  Intake/Output from previous day: 03/10 0701 - 03/11 0700 In: 1440 [P.O.:840; IV Piggyback:600] Out: 2635  Intake/Output this shift:    PE: General: in dialysis, comfortable, no distress, intermittent coughing Chest: poor air movement, no focal findings in anterior fields Abd: soft, non-tender, +BS, ND, large midline hernia with valsalva, reduces with relaxation   Lab Results:   Recent Labs  08/22/12 0655 08/23/12 0455  WBC 14.8* 17.1*  HGB 8.7* 8.7*  HCT 28.2* 28.2*  PLT 182 183   BMET  Recent Labs  08/22/12 0655 08/23/12 0642  NA 130* 135  K 4.3 3.3*  CL 94* 98  CO2 27 30  GLUCOSE 99 59*  BUN 31* 18  CREATININE 2.37* 1.68*  CALCIUM 11.0* 9.5   PT/INR  Recent Labs  08/21/12 1902  LABPROT 18.9*  INR 1.64*   CMP     Component Value Date/Time   NA 135 08/23/2012 0642   K 3.3* 08/23/2012 0642   CL 98 08/23/2012 0642   CO2 30 08/23/2012 0642   GLUCOSE 59* 08/23/2012 0642   BUN 18 08/23/2012 0642   CREATININE 1.68* 08/23/2012 0642   CALCIUM 9.5 08/23/2012 0642   PROT 6.6 08/22/2012 0655   ALBUMIN 1.2* 08/23/2012 0642   AST 17 08/22/2012 0655   ALT 10 08/22/2012 0655   ALKPHOS 160* 08/22/2012 0655   BILITOT 0.4 08/22/2012 0655   GFRNONAA 31* 08/23/2012 0642   GFRAA 36* 08/23/2012 0642   Lipase     Component Value Date/Time   LIPASE 24 08/13/2012 2142     Studies/Results: No results found.  Anti-infectives: Anti-infectives   Start     Dose/Rate Route Frequency Ordered Stop   08/20/12 1200  ceFEPIme (MAXIPIME) 2 g in dextrose 5 % 50 mL IVPB  Status:  Discontinued     2 g 100 mL/hr over 30 Minutes Intravenous Every T-Th-Sa (Hemodialysis) 08/19/12 0736 08/19/12 1614   08/20/12 1200  vancomycin (VANCOCIN) 500 mg in sodium chloride 0.9 % 100 mL IVPB     500 mg 100 mL/hr over 60 Minutes Intravenous Every T-Th-Sa (Hemodialysis) 08/19/12 0746     08/19/12 1800  micafungin (MYCAMINE) 100 mg in sodium chloride 0.9 % 100 mL IVPB     100 mg 100 mL/hr over 1 Hours Intravenous Every 24 hours 08/19/12 1639     08/19/12 1700  piperacillin-tazobactam (ZOSYN) IVPB 2.25 g     2.25 g 100 mL/hr over 30 Minutes Intravenous 3 times per day 08/19/12 1637     08/19/12 1630  voriconazole (VFEND) 210 mg in sodium chloride 0.9 % 100 mL IVPB  Status:  Discontinued     4 mg/kg  51.3 kg 60.5 mL/hr over 120 Minutes Intravenous Every 12 hours 08/19/12 1618 08/19/12 1639   08/19/12 1400  levofloxacin (LEVAQUIN) tablet 500 mg  Status:  Discontinued  500 mg Oral Every 48 hours 08/17/12 1233 08/19/12 1614   08/19/12 0830  ceFEPIme (MAXIPIME) 2 g in dextrose 5 % 50 mL IVPB  Status:  Discontinued     2 g 100 mL/hr over 30 Minutes Intravenous  Once 08/19/12 0736 08/19/12 1625   08/19/12 0830  vancomycin (VANCOCIN) IVPB 1000 mg/200 mL premix     1,000 mg 200 mL/hr over 60 Minutes Intravenous  Once 08/19/12 0746 08/19/12 1727   08/17/12 1400  levofloxacin (LEVAQUIN) tablet 750 mg     750 mg Oral  Once 08/17/12 1233 08/17/12 2218   08/17/12 1230  levofloxacin (LEVAQUIN) tablet 750 mg  Status:  Discontinued     750 mg Oral Every 48 hours 08/17/12 1229 08/17/12 1232   08/16/12 1200  vancomycin (VANCOCIN) 500 mg in sodium chloride 0.9 % 100 mL IVPB  Status:  Discontinued     500 mg 100 mL/hr over 60 Minutes Intravenous Every T-Th-Sa (Hemodialysis) 08/14/12 0128  08/18/12 0848   08/14/12 2030  piperacillin-tazobactam (ZOSYN) IVPB 2.25 g  Status:  Discontinued     2.25 g 100 mL/hr over 30 Minutes Intravenous Every 8 hours 08/14/12 2010 08/18/12 0848   08/14/12 0800  piperacillin-tazobactam (ZOSYN) IVPB 2.25 g  Status:  Discontinued     2.25 g 100 mL/hr over 30 Minutes Intravenous Every 8 hours 08/14/12 0128 08/14/12 2010   08/14/12 0130  vancomycin (VANCOCIN) 1,250 mg in sodium chloride 0.9 % 250 mL IVPB     1,250 mg 166.7 mL/hr over 90 Minutes Intravenous  Once 08/14/12 0128 08/14/12 0359   08/14/12 0130  piperacillin-tazobactam (ZOSYN) IVPB 2.25 g     2.25 g 100 mL/hr over 30 Minutes Intravenous  Once 08/14/12 0128 08/14/12 0259       Assessment/Plan  1. Peripancreatic fluid collection, s/p aspiration  2. Cough & Leukocytosis  Plan: 1. Patient feels well currently.  Tolerating Regular Diet without difficulty.  No surgical intervention necessary.   2. Cont abx therapy for other small collection. 3. Treat conservatively. 4. Cough & worsening Leukocytosis per primary, already on coverage for HCAP    LOS: 10 days   Andrena Mews, DO [facility] Resident - PGY-2 08/23/2012 8:23 AM  Pager: [phone]

## 2012-08-23 NOTE — Progress Notes (Signed)
Patient had 5-beats run of V-tach, blood pressure 115/82, no c/o sob or chest pain.  Dr. Robb Matar notified.

## 2012-08-23 NOTE — Progress Notes (Signed)
[facility]     Reason for Consult: abscess    Referring Physician: Dr. David Stall  Principal Problem:   SOB (shortness of breath) Active Problems:   Chronic systolic congestive heart failure, NYHA class 3   Hypertension   Pancreatitis, chronic   Anemia in chronic kidney disease (CKD)   ESRD on hemodialysis   Leukocytosis   Hyponatremia   HCAP (healthcare-associated pneumonia)   Acute on chronic systolic heart failure   Fever, unspecified   Abscess, abdomen   . aspirin EC  81 mg Oral Daily  . chlorpheniramine-HYDROcodone  5 mL Oral Q12H  . cinacalcet  30 mg Oral Q breakfast  . [START ON 08/25/2012] darbepoetin (ARANESP) injection - DIALYSIS  100 mcg Intravenous Q Thu-HD  . docusate sodium  100 mg Oral BID  . feeding supplement (NEPRO CARB STEADY)  237 mL Oral BID WC  . ferric gluconate (FERRLECIT/NULECIT) IV  125 mg Intravenous Q T,Th,Sa-HD  . insulin aspart  0-9 Units Subcutaneous TID WC  . meropenem (MERREM) IV  500 mg Intravenous Once  . [START ON 08/24/2012] meropenem (MERREM) IV  500 mg Intravenous Q2000  . methadone  10 mg Oral QID  . metoprolol tartrate  12.5 mg Oral BID  . multivitamin  1 tablet Oral QHS  . pantoprazole  40 mg Oral Daily  . sevelamer  400 mg Oral TID WC  . simvastatin  10 mg Oral q1800    Recommendations: Continue meropenem, d/c vancomycin, micafungin   Assessment: She has abscess vs pseudocyst collection and drainage growing GNR.     Antibiotics: Meropenem day 1  HPI: [name] is a 62 y.o. female with ESRD, cirrhosis, pancreatic pseudocysts with Candida glabrata and ascites who presented on 3/1 with SOB.  Her work up has revealed recurrence of her pancreatic abscess and she had it drained by IR on 3/8.  She has continued to have fever and leukocytosis and reports continued abdominal pain.  She also complains of pain due to the ascites and is hoping for a therapeutic drain.     Review of  Systems: Pertinent items are noted in HPI.  Past Medical History  Diagnosis Date  . Coronary artery disease     s/p CABG 2008 with multiple PCIs  . Ischemic cardiomyopathy     Cath 04/2012, significant calcifications  . History of nonadherence to medical treatment   . Systolic CHF     16/1096 echo EF 10%  . Esophageal varices   . Cirrhosis     hx of fatty liver per patient, has known ascites with repeated paracenteses at [facility] as of 2013, also +hx of esoph varices with banding in the past  . Pacemaker   . ICD (implantable cardiac defibrillator) in place   . Hypertension     "used to have HTN; now I'm low" (05/23/2012)  . NSTEMI (non-ST elevated myocardial infarction)     04/2012; Trop peaked to 1.39; 05/23/2012 pt denies ever having MI  . Pneumonia 02/2012    "first time ever" (05/23/2012)  . Chronic bronchitis 1970's thru 2002    "went away when I stopped smoking in 2002" (05/23/2012)  . Diabetes mellitus     Diet controlled  . History of blood transfusion     "alot of them" (05/23/2012)  . Iron (Fe) deficiency anemia     "severe" (05/23/2012)  . Pancreatitis, chronic 10/04/2011    Had severe gallstone pancreatitis in May 2003, no surgery, treated  medically then returned in June 2003 for cholecystectomy and cyst gastrostomy for pancreatic pseudocyst. In March 2013 was admitted for hemorrhage into psueodcyst. In April 2013 was treated at [facility] for polymicrobial bacteremia (fungal, MRSA and enterobacter) felt to be due to ruptured pseudocyst, treated medically. Admitted Jan 2014 with abd pain and had gas and fluid in pancreatic bed. She refused surgery ("would not make it") and had fluid aspirated by IR- gram stain showed yeast and cultures grew enterococcus species, sensitive to amp and vanc. She was seen by ID and discharged on IV vanc/fortaz with HD and po voriconazole.     [name] ESRD on hemodialysis 05/06/2012    T,Th,Sat HD by [facility] on [address]. Started hemodialysis around July  2013.    [name] CHF (congestive heart failure)   . Complication of vascular access for dialysis 07/13/2012    L arm AVG (placed by Dr. Lyda Perone [facility]) was ligated 1/6 after significant L arm swelling secondary to L SCV stenosis and pacemaker; new access to be placed at right arm on 1/29 by Dr. Hollace Hayward      History  Substance Use Topics  . Smoking status: Former Smoker -- 0.33 packs/day for 31 years    Types: Cigarettes    Quit date: 06/15/2000  . Smokeless tobacco: Never Used  . Alcohol Use: No     Comment: 05/23/2012 "drank from age [age]; never had problem w/it"    Family History  Problem Relation Age of Onset  . Coronary artery disease Mother   . Hypertension Mother   . Diabetes Mother   . Diabetes Sister   . Anesthesia problems Neg Hx    Allergies  Allergen Reactions  . Codeine Itching  . Morphine And Related Shortness Of Breath    SOB when given IV once "to me it was mild; I called out fast for help; never had to intubate" (05/23/2012)  . Ace Inhibitors Other (See Comments)    Unknown reaction but her physician stated that she can't take it (specifically lisinopril)  . Tylenol (Acetaminophen) Other (See Comments)    Patient states that the doctor told her she has a Fatty Liver.    OBJECTIVE: Blood pressure 99/59, pulse 81, temperature 97.2 F (36.2 C), temperature source Tympanic, resp. rate 20, height 5\' 4"  (1.626 m), weight 106 lb 7.7 oz (48.3 kg), SpO2 98.00%. General: Awake, alert, chronically ill appearing Skin: no rashes Lungs: CTA B Cor: RRR without m/r/g Abdomen: tenderness on left flank, soft, no rebound, mild distension Ext: no edema  Microbiology: Recent Results (from the past 240 hour(s))  MRSA PCR SCREENING     Status: None   Collection Time    08/14/12  1:24 AM      Result Value Range Status   MRSA by PCR NEGATIVE  NEGATIVE Final   Comment:            The GeneXpert MRSA Assay (FDA     approved for NASAL specimens     only), is one  component of a     comprehensive MRSA colonization     surveillance program. It is not     intended to diagnose MRSA     infection nor to guide or     monitor treatment for     MRSA infections.  URINE CULTURE     Status: None   Collection Time    08/14/12  8:06 PM      Result Value Range Status   Specimen  Description URINE, CLEAN CATCH   Final   Special Requests NONE   Final   Culture  Setup Time 08/15/2012 01:17   Final   Colony Count >=100,000 COLONIES/ML   Final   Culture     Final   Value: VANCOMYCIN RESISTANT ENTEROCOCCUS ISOLATED     Note: CRITICAL RESULT CALLED TO, READ BACK BY AND VERIFIED WITH: KATHY RICKER @ 0320 ON 08/19/2012   Report Status 08/19/2012 FINAL   Final   Organism ID, Bacteria VANCOMYCIN RESISTANT ENTEROCOCCUS ISOLATED   Final  CULTURE, ROUTINE-ABSCESS     Status: None   Collection Time    08/20/12  2:14 PM      Result Value Range Status   Specimen Description ABSCESS   Final   Special Requests CT GUIDED ASPIRATION OF PANCREATIC PSEUDOCYST   Final   Gram Stain     Final   Value: ABUNDANT WBC PRESENT, PREDOMINANTLY PMN     NO SQUAMOUS EPITHELIAL CELLS SEEN     NO ORGANISMS SEEN   Culture MODERATE GRAM NEGATIVE RODS   Final   Report Status PENDING   Incomplete  CULTURE, BLOOD (ROUTINE X 2)     Status: None   Collection Time    08/20/12  9:45 PM      Result Value Range Status   Specimen Description BLOOD LEFT ARM   Final   Special Requests BOTTLES DRAWN AEROBIC AND ANAEROBIC 10CC EACH   Final   Culture  Setup Time 08/21/2012 15:55   Final   Culture     Final   Value:        BLOOD CULTURE RECEIVED NO GROWTH TO DATE CULTURE WILL BE HELD FOR 5 DAYS BEFORE ISSUING A FINAL NEGATIVE REPORT   Report Status PENDING   Incomplete  CULTURE, BLOOD (ROUTINE X 2)     Status: None   Collection Time    08/20/12 10:00 PM      Result Value Range Status   Specimen Description BLOOD LEFT HAND   Final   Special Requests BOTTLES DRAWN AEROBIC AND ANAEROBIC 10CC EACH    Final   Culture  Setup Time 08/21/2012 15:54   Final   Culture     Final   Value:        BLOOD CULTURE RECEIVED NO GROWTH TO DATE CULTURE WILL BE HELD FOR 5 DAYS BEFORE ISSUING A FINAL NEGATIVE REPORT   Report Status PENDING   Incomplete    Staci Righter, MD [facility] [phone] pager  [phone] cell 08/23/2012, 4:10 PM

## 2012-08-23 NOTE — Progress Notes (Deleted)
Meet-and-greet with pt. Introduced myself and provided information about counseling services to pt. Pt stated that he was experiencing a headache and had undergone several physical examinations today. Pt and I plan for my next visit later on in the week.   Sherol Dade Counselor Intern Haroldine Laws

## 2012-08-23 NOTE — Progress Notes (Signed)
Utilization review completed.  

## 2012-08-23 NOTE — Progress Notes (Signed)
I have seen and examined the patient and agree with Dr. Janeece Riggers note. Cont abx.  If WBC cont to increase may have to reimage to reassess the perisplenic abscess. Cx from abd wall aspiration still pending.

## 2012-08-23 NOTE — Progress Notes (Signed)
Checked in with pt and pt remembers me from her last hospital stay in the unit. Pt described that she was experiencing difficulties with her cough, especially in the night. Pt's stepdaughter was visiting with pt today and I told pt I will visit her again later on in the week.   Sherol Dade Counselor Intern Haroldine Laws

## 2012-08-23 NOTE — Progress Notes (Signed)
Subjective: No complaints  Objective Vital signs in last 24 hours: Filed Vitals:   08/23/12 1023 08/23/12 1036 08/23/12 1056 08/23/12 1500  BP: 103/52 104/67 101/66 99/59  Pulse: 77 77 77 81  Temp:   97.7 F (36.5 C) 97.2 F (36.2 C)  TempSrc:   Tympanic Tympanic  Resp: 14 20 21 20   Height:      Weight:   48.3 kg (106 lb 7.7 oz)   SpO2:  100% 98% 98%   Weight change: -3 kg (-6 lb 9.8 oz)  Intake/Output Summary (Last 24 hours) at 08/23/12 1632 Last data filed at 08/23/12 1420  Gross per 24 hour  Intake   1680 ml  Output    150 ml  Net   1530 ml   Labs: Basic Metabolic Panel:  Recent Labs Lab 08/18/12 1344 08/20/12 0516 08/21/12 0500 08/22/12 0655 08/23/12 0642  NA 131* 129*  --  130* 135  K 3.9 3.8  --  4.3 3.3*  CL 94* 93*  --  94* 98  CO2 27 29  --  27 30  GLUCOSE 221* 58*  --  99 59*  BUN 37* 31*  --  31* 18  CREATININE 2.52* 2.42* 1.68* 2.37* 1.68*  CALCIUM 10.5 11.2*  --  11.0* 9.5  PHOS 3.4 3.7  --   --  3.1   Liver Function Tests:  Recent Labs Lab 08/20/12 0516 08/22/12 0655 08/23/12 0642  AST  --  17  --   ALT  --  10  --   ALKPHOS  --  160*  --   BILITOT  --  0.4  --   PROT  --  6.6  --   ALBUMIN 1.5* 1.5* 1.2*   No results found for this basename: LIPASE, AMYLASE,  in the last 168 hours No results found for this basename: AMMONIA,  in the last 168 hours CBC:  Recent Labs Lab 08/19/12 1021 08/20/12 0500 08/22/12 0655 08/23/12 0455  WBC 13.4* 13.6* 14.8* 17.1*  HGB 8.8* 8.4* 8.7* 8.7*  HCT 28.9* 27.0* 28.2* 28.2*  MCV 94.1 93.8 94.0 93.7  PLT PLATELET CLUMPS NOTED ON SMEAR, COUNT APPEARS ADEQUATE 185 182 183   PT/INR: @LABRCNTIP (inr:5)   Scheduled Meds ) . aspirin EC  81 mg Oral Daily  . chlorpheniramine-HYDROcodone  5 mL Oral Q12H  . cinacalcet  30 mg Oral Q breakfast  . [START ON 08/25/2012] darbepoetin (ARANESP) injection - DIALYSIS  100 mcg Intravenous Q Thu-HD  . docusate sodium  100 mg Oral BID  . feeding supplement  (NEPRO CARB STEADY)  237 mL Oral BID WC  . ferric gluconate (FERRLECIT/NULECIT) IV  125 mg Intravenous Q T,Th,Sa-HD  . insulin aspart  0-9 Units Subcutaneous TID WC  . meropenem (MERREM) IV  500 mg Intravenous Once  . [START ON 08/24/2012] meropenem (MERREM) IV  500 mg Intravenous Q2000  . methadone  10 mg Oral QID  . metoprolol tartrate  12.5 mg Oral BID  . multivitamin  1 tablet Oral QHS  . pantoprazole  40 mg Oral Daily  . sevelamer  400 mg Oral TID WC  . simvastatin  10 mg Oral q1800    Physical Exam:  Blood pressure 99/59, pulse 81, temperature 97.2 F (36.2 C), temperature source Tympanic, resp. rate 20, height 5\' 4"  (1.626 m), weight 48.3 kg (106 lb 7.7 oz), SpO2 98.00%.  General: Very talkative, eating oatmeal  Heart: RRR  Lungs: diminished bases with rare crackles  Abdomen: soft tender  throughout L abdomen/flank  Extremities: 2 - 3 + edema to thighs  Dialysis Access: right I-J  Dialysis Orders: Center: [facility] on TTS.  EDW 53.5 kg HD Bath 3K/2.5Ca Time 4hrs Heparin 1600 U. Access Right IJ catheter BFR 400 DFR 800 Hectorol 0 mcg IV/HD Epogen 1500 Units IV/HD Venofer 0   Assessment/Plan:  1. Chronic pancreatitis/abd pain/infected pancreatic fluid- fluid aspirate growing GNR's, ID changed abx to meropenem. Pt refuses any abd surgery. Medical Rx 2. ESRD - TTS HD; chronic hyponatremia secondary to volume overload and nonadherence with outpt HD. HD again today. Na improving 3. Anemia - Hgb 8.7 s/p 2 units on 3/2; . Aranesp 100. Tsat 22% and Ferritin 1513 on 2/20. IV fe. 4. Secondary hyperparathyroidism / hypercalcemia - P controlled; no hectorol; corrected Ca 13. Low Ca bath. Renvela.- add sensipar 30  5. HTN/volume/ascities - SBPs 99-110's on metoprolol 12.5 bid. Below dry wt on admission. +LE edema, max UF as tolerated with HD 6. Nutrition - Albumin 1.5. (Has PC) . Reg diet With fluid restrition 7. Chronic systolic CHF, NYHA class 3 - per primary also severe MR from last echo done in  Mar '14 8. DNR   Vinson Moselle  MD [phone] pgr    [phone] cell 08/23/2012, 4:32 PM

## 2012-08-23 NOTE — Progress Notes (Signed)
ANTIBIOTIC CONSULT NOTE - FOLLOW UP  Pharmacy Consult for Meropenem + Vancomycin Indication: Empiric HCAP and pancreatic pseudocyst/abscess coverage  Allergies  Allergen Reactions  . Codeine Itching  . Morphine And Related Shortness Of Breath    SOB when given IV once "to me it was mild; I called out fast for help; never had to intubate" (05/23/2012)  . Ace Inhibitors Other (See Comments)    Unknown reaction but her physician stated that she can't take it (specifically lisinopril)  . Tylenol (Acetaminophen) Other (See Comments)    Patient states that the doctor told her she has a Fatty Liver.    Patient Measurements: Height: 5\' 4"  (162.6 cm) Weight: 110 lb 7.2 oz (50.1 kg) IBW/kg (Calculated) : 54.7  Vital Signs: Temp: 97.9 F (36.6 C) (03/11 0639) Temp src: Oral (03/11 0639) BP: 103/52 mmHg (03/11 1023) Pulse Rate: 77 (03/11 1023) Intake/Output from previous day: 03/10 0701 - 03/11 0700 In: 1440 [P.O.:840; IV Piggyback:600] Out: 2635  Intake/Output from this shift:    Labs:  Recent Labs  08/21/12 0500 08/22/12 0655 08/23/12 0455 08/23/12 0642  WBC  --  14.8* 17.1*  --   HGB  --  8.7* 8.7*  --   PLT  --  182 183  --   CREATININE 1.68* 2.37*  --  1.68*   Estimated Creatinine Clearance: 27.1 ml/min (by C-G formula based on Cr of 1.68). No results found for this basename: VANCOTROUGH, Leodis Binet, VANCORANDOM, GENTTROUGH, GENTPEAK, GENTRANDOM, TOBRATROUGH, TOBRAPEAK, TOBRARND, AMIKACINPEAK, AMIKACINTROU, AMIKACIN,  in the last 72 hours   Microbiology: Recent Results (from the past 720 hour(s))  MRSA PCR SCREENING     Status: None   Collection Time    08/14/12  1:24 AM      Result Value Range Status   MRSA by PCR NEGATIVE  NEGATIVE Final   Comment:            The GeneXpert MRSA Assay (FDA     approved for NASAL specimens     only), is one component of a     comprehensive MRSA colonization     surveillance program. It is not     intended to diagnose MRSA   infection nor to guide or     monitor treatment for     MRSA infections.  URINE CULTURE     Status: None   Collection Time    08/14/12  8:06 PM      Result Value Range Status   Specimen Description URINE, CLEAN CATCH   Final   Special Requests NONE   Final   Culture  Setup Time 08/15/2012 01:17   Final   Colony Count >=100,000 COLONIES/ML   Final   Culture     Final   Value: VANCOMYCIN RESISTANT ENTEROCOCCUS ISOLATED     Note: CRITICAL RESULT CALLED TO, READ BACK BY AND VERIFIED WITH: KATHY RICKER @ 0320 ON 08/19/2012   Report Status 08/19/2012 FINAL   Final   Organism ID, Bacteria VANCOMYCIN RESISTANT ENTEROCOCCUS ISOLATED   Final  CULTURE, ROUTINE-ABSCESS     Status: None   Collection Time    08/20/12  2:14 PM      Result Value Range Status   Specimen Description ABSCESS   Final   Special Requests CT GUIDED ASPIRATION OF PANCREATIC PSEUDOCYST   Final   Gram Stain     Final   Value: ABUNDANT WBC PRESENT, PREDOMINANTLY PMN     NO SQUAMOUS EPITHELIAL CELLS SEEN  NO ORGANISMS SEEN   Culture MODERATE GRAM NEGATIVE RODS   Final   Report Status PENDING   Incomplete  CULTURE, BLOOD (ROUTINE X 2)     Status: None   Collection Time    08/20/12  9:45 PM      Result Value Range Status   Specimen Description BLOOD LEFT ARM   Final   Special Requests BOTTLES DRAWN AEROBIC AND ANAEROBIC 10CC EACH   Final   Culture  Setup Time 08/21/2012 15:55   Final   Culture     Final   Value:        BLOOD CULTURE RECEIVED NO GROWTH TO DATE CULTURE WILL BE HELD FOR 5 DAYS BEFORE ISSUING A FINAL NEGATIVE REPORT   Report Status PENDING   Incomplete  CULTURE, BLOOD (ROUTINE X 2)     Status: None   Collection Time    08/20/12 10:00 PM      Result Value Range Status   Specimen Description BLOOD LEFT HAND   Final   Special Requests BOTTLES DRAWN AEROBIC AND ANAEROBIC 10CC EACH   Final   Culture  Setup Time 08/21/2012 15:54   Final   Culture     Final   Value:        BLOOD CULTURE RECEIVED NO GROWTH TO  DATE CULTURE WILL BE HELD FOR 5 DAYS BEFORE ISSUING A FINAL NEGATIVE REPORT   Report Status PENDING   Incomplete    Anti-infectives   Start     Dose/Rate Route Frequency Ordered Stop   08/24/12 2000  meropenem (MERREM) 500 mg in sodium chloride 0.9 % 50 mL IVPB     500 mg 100 mL/hr over 30 Minutes Intravenous Daily 08/23/12 1024     08/23/12 1200  meropenem (MERREM) 500 mg in sodium chloride 0.9 % 50 mL IVPB     500 mg 100 mL/hr over 30 Minutes Intravenous  Once 08/23/12 1023     08/20/12 1200  ceFEPIme (MAXIPIME) 2 g in dextrose 5 % 50 mL IVPB  Status:  Discontinued     2 g 100 mL/hr over 30 Minutes Intravenous Every T-Th-Sa (Hemodialysis) 08/19/12 0736 08/19/12 1614   08/20/12 1200  vancomycin (VANCOCIN) 500 mg in sodium chloride 0.9 % 100 mL IVPB     500 mg 100 mL/hr over 60 Minutes Intravenous Every T-Th-Sa (Hemodialysis) 08/19/12 0746     08/19/12 1800  micafungin (MYCAMINE) 100 mg in sodium chloride 0.9 % 100 mL IVPB     100 mg 100 mL/hr over 1 Hours Intravenous Every 24 hours 08/19/12 1639     08/19/12 1700  piperacillin-tazobactam (ZOSYN) IVPB 2.25 g  Status:  Discontinued     2.25 g 100 mL/hr over 30 Minutes Intravenous 3 times per day 08/19/12 1637 08/23/12 1022   08/19/12 1630  voriconazole (VFEND) 210 mg in sodium chloride 0.9 % 100 mL IVPB  Status:  Discontinued     4 mg/kg  51.3 kg 60.5 mL/hr over 120 Minutes Intravenous Every 12 hours 08/19/12 1618 08/19/12 1639   08/19/12 1400  levofloxacin (LEVAQUIN) tablet 500 mg  Status:  Discontinued     500 mg Oral Every 48 hours 08/17/12 1233 08/19/12 1614   08/19/12 0830  ceFEPIme (MAXIPIME) 2 g in dextrose 5 % 50 mL IVPB  Status:  Discontinued     2 g 100 mL/hr over 30 Minutes Intravenous  Once 08/19/12 0736 08/19/12 1625   08/19/12 0830  vancomycin (VANCOCIN) IVPB 1000 mg/200 mL premix  1,000 mg 200 mL/hr over 60 Minutes Intravenous  Once 08/19/12 0746 08/19/12 1727   08/17/12 1400  levofloxacin (LEVAQUIN) tablet 750 mg      750 mg Oral  Once 08/17/12 1233 08/17/12 2218   08/17/12 1230  levofloxacin (LEVAQUIN) tablet 750 mg  Status:  Discontinued     750 mg Oral Every 48 hours 08/17/12 1229 08/17/12 1232   08/16/12 1200  vancomycin (VANCOCIN) 500 mg in sodium chloride 0.9 % 100 mL IVPB  Status:  Discontinued     500 mg 100 mL/hr over 60 Minutes Intravenous Every T-Th-Sa (Hemodialysis) 08/14/12 0128 08/18/12 0848   08/14/12 2030  piperacillin-tazobactam (ZOSYN) IVPB 2.25 g  Status:  Discontinued     2.25 g 100 mL/hr over 30 Minutes Intravenous Every 8 hours 08/14/12 2010 08/18/12 0848   08/14/12 0800  piperacillin-tazobactam (ZOSYN) IVPB 2.25 g  Status:  Discontinued     2.25 g 100 mL/hr over 30 Minutes Intravenous Every 8 hours 08/14/12 0128 08/14/12 2010   08/14/12 0130  vancomycin (VANCOCIN) 1,250 mg in sodium chloride 0.9 % 250 mL IVPB     1,250 mg 166.7 mL/hr over 90 Minutes Intravenous  Once 08/14/12 0128 08/14/12 0359   08/14/12 0130  piperacillin-tazobactam (ZOSYN) IVPB 2.25 g     2.25 g 100 mL/hr over 30 Minutes Intravenous  Once 08/14/12 0128 08/14/12 0259      Assessment: 62 y.o. F with ESRD changing from Zosyn to Meropenem this morning given that the abscess cultures were taken while the patient was on Zosyn and GNR are growing. The patient also continues on Vancomycin- Rx and along with Micafungin per MD for empiric pancreatic pseudocyst/abscess and HCAP coverage.   The patient is noted to have received an extra HD session on 3/10 that lasted ~4 hours and consisted of alternating UF and HD every 30 minutes for extra fluid removal. The patient did not receive an extra Vancomycin dose after this session and estimated levels are still appropriate at this time. The patient is currently in HD this morning and tolerating well -- the patient will receive a Vancomycin maintenance dose as scheduled today.   Cefepime 3/7 >>3/7 Levaquin 3/6 >>3/7 Vanc 3/2 >> 3/6; 3/7 >> * Pre-HD Vanc level: 27.3 >>  continue current dosing. Zosyn 3/2 >> 3/6; 3/7 >> 3/11 Micafungin 3/7>> Meropenem 3/11 >>   3/8 BCx >> ngx2d 3/8 Abscess asp >> moderate GNR (last update 3/10) 3/2 UCx >> VRE  Goal of Therapy:  Pre-HD Vancomycin level of 15-25 mcg/ml Proper antibiotics for infection/cultures adjusted for renal/hepatic function   Plan:  1. Meropenem 500 mg IV x 1 now followed by Meropenem 500 mg daily in the evening (after HD on HD days) 2. Continue Vancomycin 500 mg post HD sessions on T/Th/Sat 4. Will continue to follow HD schedule/duration, culture results, LOT, and antibiotic de-escalation plans   Georgina Pillion, PharmD, BCPS Clinical Pharmacist Pager: [phone] 08/23/2012 10:37 AM

## 2012-08-23 NOTE — Progress Notes (Signed)
NUTRITION FOLLOW UP  DOCUMENTATION CODES  Per approved criteria   -Severe malnutrition in the context of chronic illness    Intervention:   1. Agree with liberalized diet. Continue to encourage Nepro Shakes. 2. RD to continue to follow nutrition care plan  Nutrition Dx:   Increased nutrient needs related to HD as evidenced by estimated needs. Ongoing.  Goal:   Pt to meet >/= 90% of their estimated nutrition needs.  Monitor:   weight trends, lab trends, I/O's, PO intake, supplement tolerance  Assessment:   Admitted with worsening SOB, work-up reveals HCAP. Pt also noted to have missed HD session PTA.  CT scan of abdomen and pelvis showed 2 new fluid and gas collection along the left lateral abdominal wall and the body of the pancreas. Surgery consulted, however pt is very high risk for any sort of surgical intervention and she is aware of this and essentially refuses any surgery. Recommendations include percutaneous drainage.  Underwent CT-guided aspiration or pseudocyst 3/8. Now with worsening leukocytosis, but better abdominal pain.   Liberalized to a Regular diet yesterday. Potassium is low. Phos is WNL. Continues with order for Nepro Shake po BID.  Height: Ht Readings from Last 1 Encounters:  08/22/12 5\' 4"  (1.626 m)    Weight Status:   Wt Readings from Last 1 Encounters:  08/23/12 106 lb 7.7 oz (48.3 kg)  106 lb s/p HD 3/11; trending down with HD  Body mass index is 18.27 kg/(m^2). Underweight.  Re-estimated needs:  Kcal: 1600 - 1750 Protein: 65 - 75 grams Fluid: 1.2 liters  Skin: abdomen puncture  Diet Order: General   Intake/Output Summary (Last 24 hours) at 08/23/12 1113 Last data filed at 08/23/12 1056  Gross per 24 hour  Intake   1200 ml  Output    149 ml  Net   1051 ml    Last BM: 3/10   Labs:   Recent Labs Lab 08/18/12 1344 08/20/12 0516 08/21/12 0500 08/22/12 0655 08/23/12 0642  NA 131* 129*  --  130* 135  K 3.9 3.8  --  4.3 3.3*   CL 94* 93*  --  94* 98  CO2 27 29  --  27 30  BUN 37* 31*  --  31* 18  CREATININE 2.52* 2.42* 1.68* 2.37* 1.68*  CALCIUM 10.5 11.2*  --  11.0* 9.5  PHOS 3.4 3.7  --   --  3.1  GLUCOSE 221* 58*  --  99 59*    CBG (last 3)   Recent Labs  08/22/12 0745 08/22/12 1201 08/22/12 2016  GLUCAP 98 197* 90    Scheduled Meds: . aspirin EC  81 mg Oral Daily  . chlorpheniramine-HYDROcodone  5 mL Oral Q12H  . cinacalcet  30 mg Oral Q breakfast  . [START ON 08/25/2012] darbepoetin (ARANESP) injection - DIALYSIS  100 mcg Intravenous Q Thu-HD  . docusate sodium  100 mg Oral BID  . feeding supplement (NEPRO CARB STEADY)  237 mL Oral BID WC  . ferric gluconate (FERRLECIT/NULECIT) IV  125 mg Intravenous Q T,Th,Sa-HD  . insulin aspart  0-9 Units Subcutaneous TID WC  . meropenem (MERREM) IV  500 mg Intravenous Once  . [START ON 08/24/2012] meropenem (MERREM) IV  500 mg Intravenous Q2000  . methadone  10 mg Oral QID  . metoprolol tartrate  12.5 mg Oral BID  . micafungin (MYCAMINE) IV  100 mg Intravenous Q24H  . multivitamin  1 tablet Oral QHS  . pantoprazole  40 mg  Oral Daily  . sevelamer  400 mg Oral TID WC  . simvastatin  10 mg Oral q1800  . vancomycin  500 mg Intravenous Q T,Th,Sa-HD    Continuous Infusions:  none  Jarold Motto MS, RD, LDN Pager: [phone] After-hours pager: [phone]

## 2012-08-24 LAB — CBC
HCT: 29.6 % — ABNORMAL LOW (ref 36.0–46.0)
Hemoglobin: 9.2 g/dL — ABNORMAL LOW (ref 12.0–15.0)
MCH: 28.7 pg (ref 26.0–34.0)
MCV: 92.2 fL (ref 78.0–100.0)
RBC: 3.21 MIL/uL — ABNORMAL LOW (ref 3.87–5.11)

## 2012-08-24 LAB — GLUCOSE, CAPILLARY: Glucose-Capillary: 98 mg/dL (ref 70–99)

## 2012-08-24 MED ORDER — GLUCOSE 40 % PO GEL
ORAL | Status: AC
  Administered 2012-08-24: 37.5 g
  Filled 2012-08-24: qty 1

## 2012-08-24 MED ORDER — SODIUM CHLORIDE 0.9 % IV SOLN
100.0000 mg | Freq: Every day | INTRAVENOUS | Status: DC
  Filled 2012-08-24: qty 100

## 2012-08-24 MED ORDER — SODIUM CHLORIDE 0.9 % IV SOLN
1.0000 g | Freq: Four times a day (QID) | INTRAVENOUS | Status: DC
  Filled 2012-08-24 (×2): qty 1000

## 2012-08-24 MED ORDER — GLUCOSE-VITAMIN C 4-6 GM-MG PO CHEW
CHEWABLE_TABLET | ORAL | Status: AC
  Filled 2012-08-24: qty 1

## 2012-08-24 NOTE — Progress Notes (Signed)
I have seen and examined the patient and agree with PA-Osborne's note.  

## 2012-08-24 NOTE — Progress Notes (Signed)
CBG:59 Treatment: 15 GM carbohydrate snack Glucose gel 15 gm   Symptoms: Shaky  Follow-up CBG: Time: CBG Result:60  Follow up CBG time: 1812        CBG Result: 99 Possible Reasons for Event: Inadequate meal intake  Comments/MD notified:

## 2012-08-24 NOTE — Progress Notes (Signed)
Patient ID: [name], female   DOB: [birth year], 62 y.o.   MRN: [id number]    Subjective: Pt c/o some abdominal pain, but says it's different than her normal pain with her pancreas.  She states this is a tight feeling like she needs to get a paracentesis, like she has to do occasionally.  Tolerating her diet  Objective: Vital signs in last 24 hours: Temp:  [97.2 F (36.2 C)-98.5 F (36.9 C)] 97.9 F (36.6 C) (03/12 1010) Pulse Rate:  [81-96] 96 (03/12 1010) Resp:  [20] 20 (03/12 1010) BP: (99-122)/(59-79) 122/79 mmHg (03/12 1010) SpO2:  [95 %-98 %] 95 % (03/12 1010) Weight:  [106 lb 7.7 oz (48.3 kg)] 106 lb 7.7 oz (48.3 kg) (03/11 2106) Last BM Date: 08/24/12  Intake/Output from previous day: 03/11 0701 - 03/12 0700 In: 1500 [P.O.:1400; IV Piggyback:100] Out: -2485 [Stool:1] Intake/Output this shift:    PE: Abd: soft, mild tenderness, +BS Chest: heart reg, pacemaker in place  Lab Results:   Recent Labs  08/22/12 0655 08/23/12 0455  WBC 14.8* 17.1*  HGB 8.7* 8.7*  HCT 28.2* 28.2*  PLT 182 183   BMET  Recent Labs  08/22/12 0655 08/23/12 0642  NA 130* 135  K 4.3 3.3*  CL 94* 98  CO2 27 30  GLUCOSE 99 59*  BUN 31* 18  CREATININE 2.37* 1.68*  CALCIUM 11.0* 9.5   PT/INR  Recent Labs  08/21/12 1902  LABPROT 18.9*  INR 1.64*   CMP     Component Value Date/Time   NA 135 08/23/2012 0642   K 3.3* 08/23/2012 0642   CL 98 08/23/2012 0642   CO2 30 08/23/2012 0642   GLUCOSE 59* 08/23/2012 0642   BUN 18 08/23/2012 0642   CREATININE 1.68* 08/23/2012 0642   CALCIUM 9.5 08/23/2012 0642   PROT 6.6 08/22/2012 0655   ALBUMIN 1.2* 08/23/2012 0642   AST 17 08/22/2012 0655   ALT 10 08/22/2012 0655   ALKPHOS 160* 08/22/2012 0655   BILITOT 0.4 08/22/2012 0655   GFRNONAA 31* 08/23/2012 0642   GFRAA 36* 08/23/2012 0642   Lipase     Component Value Date/Time   LIPASE 24 08/13/2012 2142       Studies/Results: No results found.  Anti-infectives: Anti-infectives   Start     Dose/Rate Route Frequency Ordered Stop   08/24/12 2000  meropenem (MERREM) 500 mg in sodium chloride 0.9 % 50 mL IVPB     500 mg 100 mL/hr over 30 Minutes Intravenous Daily 08/23/12 1024     08/24/12 1200  ampicillin (OMNIPEN) 1 g in sodium chloride 0.9 % 50 mL IVPB  Status:  Discontinued     1 g 150 mL/hr over 20 Minutes Intravenous 4 times per day 08/24/12 0856 08/24/12 1031   08/24/12 1000  micafungin (MYCAMINE) 100 mg in sodium chloride 0.9 % 100 mL IVPB  Status:  Discontinued     100 mg 100 mL/hr over 1 Hours Intravenous Daily 08/24/12 0856 08/24/12 0945   08/23/12 1200  meropenem (MERREM) 500 mg in sodium chloride 0.9 % 50 mL IVPB     500 mg 100 mL/hr over 30 Minutes Intravenous  Once 08/23/12 1023 08/23/12 1230   08/20/12 1200  ceFEPIme (MAXIPIME) 2 g in dextrose 5 % 50 mL IVPB  Status:  Discontinued     2 g 100 mL/hr over 30 Minutes Intravenous Every T-Th-Sa (Hemodialysis) 08/19/12 0736 08/19/12 1614   08/20/12 1200  vancomycin (VANCOCIN) 500 mg in sodium chloride  0.9 % 100 mL IVPB  Status:  Discontinued     500 mg 100 mL/hr over 60 Minutes Intravenous Every T-Th-Sa (Hemodialysis) 08/19/12 0746 08/23/12 1609   08/19/12 1800  micafungin (MYCAMINE) 100 mg in sodium chloride 0.9 % 100 mL IVPB  Status:  Discontinued     100 mg 100 mL/hr over 1 Hours Intravenous Every 24 hours 08/19/12 1639 08/23/12 1609   08/19/12 1700  piperacillin-tazobactam (ZOSYN) IVPB 2.25 g  Status:  Discontinued     2.25 g 100 mL/hr over 30 Minutes Intravenous 3 times per day 08/19/12 1637 08/23/12 1022   08/19/12 1630  voriconazole (VFEND) 210 mg in sodium chloride 0.9 % 100 mL IVPB  Status:  Discontinued     4 mg/kg  51.3 kg 60.5 mL/hr over 120 Minutes Intravenous Every 12 hours 08/19/12 1618 08/19/12 1639   08/19/12 1400  levofloxacin (LEVAQUIN) tablet 500 mg  Status:  Discontinued     500 mg Oral Every 48 hours 08/17/12 1233 08/19/12 1614   08/19/12 0830  ceFEPIme (MAXIPIME) 2 g in dextrose 5 %  50 mL IVPB  Status:  Discontinued     2 g 100 mL/hr over 30 Minutes Intravenous  Once 08/19/12 0736 08/19/12 1625   08/19/12 0830  vancomycin (VANCOCIN) IVPB 1000 mg/200 mL premix     1,000 mg 200 mL/hr over 60 Minutes Intravenous  Once 08/19/12 0746 08/19/12 1727   08/17/12 1400  levofloxacin (LEVAQUIN) tablet 750 mg     750 mg Oral  Once 08/17/12 1233 08/17/12 2218   08/17/12 1230  levofloxacin (LEVAQUIN) tablet 750 mg  Status:  Discontinued     750 mg Oral Every 48 hours 08/17/12 1229 08/17/12 1232   08/16/12 1200  vancomycin (VANCOCIN) 500 mg in sodium chloride 0.9 % 100 mL IVPB  Status:  Discontinued     500 mg 100 mL/hr over 60 Minutes Intravenous Every T-Th-Sa (Hemodialysis) 08/14/12 0128 08/18/12 0848   08/14/12 2030  piperacillin-tazobactam (ZOSYN) IVPB 2.25 g  Status:  Discontinued     2.25 g 100 mL/hr over 30 Minutes Intravenous Every 8 hours 08/14/12 2010 08/18/12 0848   08/14/12 0800  piperacillin-tazobactam (ZOSYN) IVPB 2.25 g  Status:  Discontinued     2.25 g 100 mL/hr over 30 Minutes Intravenous Every 8 hours 08/14/12 0128 08/14/12 2010   08/14/12 0130  vancomycin (VANCOCIN) 1,250 mg in sodium chloride 0.9 % 250 mL IVPB     1,250 mg 166.7 mL/hr over 90 Minutes Intravenous  Once 08/14/12 0128 08/14/12 0359   08/14/12 0130  piperacillin-tazobactam (ZOSYN) IVPB 2.25 g     2.25 g 100 mL/hr over 30 Minutes Intravenous  Once 08/14/12 0128 08/14/12 0259       Assessment/Plan  1. Peripancreatic abscess, s/p aspiration 2. ESRD 3. Cough 4. Leukocytosis  Plan: 1. Patient really does not want a repeat CT scan.  i d/w Dr. Gwenlyn Perking who will order an ultrasound to see if there is any fluid to draw off.  If there is and a paracentesis resolves her pain, then no further CT or imaging is necessary.  However, if there is no fluid and pain/discomfort persist, then I think the patient needs a repeat CT scan to make sure she has not redevelop a fluid collection or the other one increased  in size. 2. Check CBC in am   LOS: 11 days    Sheva Mcdougle E 08/24/2012, 11:42 AM Pager: [phone]

## 2012-08-24 NOTE — Progress Notes (Signed)
[facility] PROGRESS NOTE Assessment/Plan: *Worsening leukocytosis and fevers due to abdominal abscess: - started on meropenem, d/c zosyn. A febrile.  - ID consulted for antibiotics management. - CBC in am to follow leukocytosis. Cultures demonstrating GNR from aspirated fluid; sensitivity and speciation pending - since still complaining of abdominal pain will get [place]. -surgery consulted for abscess management; will follow recommendations.   Chronic systolic congestive heart failure, NYHA class 3 - Cough secondary to vol overload,  Still mildly overloaded. + crackles, but no JVD today. - Received an additional HD treatment  3.6.2014. - Continue metoprolol.  -volume to be manage with HD treatment.    Hyponatremia - resolved with HD    ESRD on hemodialysis - HD TTS. - Received an additional HD treatment  3.6.2014.    Anemia in chronic kidney disease (CKD) - Hemoglobin 7.2 on admission. The patient has received 2 units of PRBCs 08/14/2012.  - Hemoglobin 8.7, will monitor -no acute bleeding    Pancreatitis, chronic - stable.    Hypertension - BP soft, especially after HD; but overall stable. Will continue monitoring  Code Status: full code  Family Communication: no family at bedside  Disposition Plan: to SNF when stable    Consultants:  Nephrology  ID  Procedures: Echo 3.5.2014: The estimated ejection fraction was in the range of 20% to 25%, Peak RV-RA gradient: 41mm Hg (S). percutaneous drain by  IR 3.8.2014.   Antibiotics:  vanc zosyn 3.4.2014- 3.5.2014  Meropenem: 3/12  HPI/Subjective Patient afebrile, and denies any nausea/vomiting, reports mild abdominal pain (tight belly sensation). Tolerating current diet.  Objective: Filed Vitals:   08/23/12 1723 08/23/12 2106 08/24/12 0521 08/24/12 1010  BP: 102/62 107/71 117/76 122/79  Pulse: 88 85 95 96  Temp: 97.4 F (36.3 C) 98.2 F (36.8 C) 98.5 F (36.9 C) 97.9 F (36.6 C)  TempSrc:  Oral Oral  Oral  Resp: 20 20 20 20   Height:  5\' 4"  (1.626 m)    Weight:  48.3 kg (106 lb 7.7 oz)    SpO2: 98% 97% 97% 95%    Intake/Output Summary (Last 24 hours) at 08/24/12 1131 Last data filed at 08/24/12 0500  Gross per 24 hour  Intake   1500 ml  Output      1 ml  Net   1499 ml   Filed Weights   08/23/12 0639 08/23/12 1056 08/23/12 2106  Weight: 50.1 kg (110 lb 7.2 oz) 48.3 kg (106 lb 7.7 oz) 48.3 kg (106 lb 7.7 oz)    Exam:  General: Alert, awake, oriented x3, in no acute distress.  HEENT: No bruits, no goiter.  Heart: Regular rate and rhythm, without murmurs, rubs, gallops.  Lungs: Good air movement, mild crackles bilaterally at bases. Abdomen: Soft, Left side pain, swollen. Mild rebound to deep palpation. EXT: 1-2+ edema bilaterally    Data Reviewed: Basic Metabolic Panel:  Recent Labs Lab 08/17/12 1400 08/18/12 1344 08/20/12 0516 08/21/12 0500 08/22/12 0655 08/23/12 0642  NA 129* 131* 129*  --  130* 135  K 3.1* 3.9 3.8  --  4.3 3.3*  CL 77* 94* 93*  --  94* 98  CO2 21 27 29   --  27 30  GLUCOSE 475* 221* 58*  --  99 59*  BUN 33* 37* 31*  --  31* 18  CREATININE 2.23* 2.52* 2.42* 1.68* 2.37* 1.68*  CALCIUM 8.1* 10.5 11.2*  --  11.0* 9.5  PHOS  --  3.4 3.7  --   --  3.1   Liver Function Tests:  Recent Labs Lab 08/18/12 1344 08/20/12 0516 08/22/12 0655 08/23/12 0642  AST  --   --  17  --   ALT  --   --  10  --   ALKPHOS  --   --  160*  --   BILITOT  --   --  0.4  --   PROT  --   --  6.6  --   ALBUMIN 1.6* 1.5* 1.5* 1.2*   CBC:  Recent Labs Lab 08/18/12 1344 08/19/12 1021 08/20/12 0500 08/22/12 0655 08/23/12 0455  WBC 16.4* 13.4* 13.6* 14.8* 17.1*  HGB 9.0* 8.8* 8.4* 8.7* 8.7*  HCT 28.1* 28.9* 27.0* 28.2* 28.2*  MCV 90.9 94.1 93.8 94.0 93.7  PLT 193 PLATELET CLUMPS NOTED ON SMEAR, COUNT APPEARS ADEQUATE 185 182 183   BNP (last 3 results)  Recent Labs  04/21/12 1419 05/08/12 0515 06/02/12 1944  PROBNP >70000.0* >70000.0* >70000.0*    CBG:  Recent Labs Lab 08/23/12 1120 08/23/12 1219 08/23/12 1628 08/23/12 2110 08/24/12 0751  GLUCAP 39* 70 153* 103* 153*    Recent Results (from the past 240 hour(s))  URINE CULTURE     Status: None   Collection Time    08/14/12  8:06 PM      Result Value Range Status   Specimen Description URINE, CLEAN CATCH   Final   Special Requests NONE   Final   Culture  Setup Time 08/15/2012 01:17   Final   Colony Count >=100,000 COLONIES/ML   Final   Culture     Final   Value: VANCOMYCIN RESISTANT ENTEROCOCCUS ISOLATED     Note: CRITICAL RESULT CALLED TO, READ BACK BY AND VERIFIED WITH: KATHY RICKER @ 0320 ON 08/19/2012   Report Status 08/19/2012 FINAL   Final   Organism ID, Bacteria VANCOMYCIN RESISTANT ENTEROCOCCUS ISOLATED   Final  CULTURE, ROUTINE-ABSCESS     Status: None   Collection Time    08/20/12  2:14 PM      Result Value Range Status   Specimen Description ABSCESS   Final   Special Requests CT GUIDED ASPIRATION OF PANCREATIC PSEUDOCYST   Final   Gram Stain     Final   Value: ABUNDANT WBC PRESENT, PREDOMINANTLY PMN     NO SQUAMOUS EPITHELIAL CELLS SEEN     NO ORGANISMS SEEN   Culture MODERATE GRAM NEGATIVE RODS   Final   Report Status PENDING   Incomplete  CULTURE, BLOOD (ROUTINE X 2)     Status: None   Collection Time    08/20/12  9:45 PM      Result Value Range Status   Specimen Description BLOOD LEFT ARM   Final   Special Requests BOTTLES DRAWN AEROBIC AND ANAEROBIC 10CC EACH   Final   Culture  Setup Time 08/21/2012 15:55   Final   Culture     Final   Value:        BLOOD CULTURE RECEIVED NO GROWTH TO DATE CULTURE WILL BE HELD FOR 5 DAYS BEFORE ISSUING A FINAL NEGATIVE REPORT   Report Status PENDING   Incomplete  CULTURE, BLOOD (ROUTINE X 2)     Status: None   Collection Time    08/20/12 10:00 PM      Result Value Range Status   Specimen Description BLOOD LEFT HAND   Final   Special Requests BOTTLES DRAWN AEROBIC AND ANAEROBIC 10CC EACH   Final   Culture   Setup Time 08/21/2012  15:54   Final   Culture     Final   Value:        BLOOD CULTURE RECEIVED NO GROWTH TO DATE CULTURE WILL BE HELD FOR 5 DAYS BEFORE ISSUING A FINAL NEGATIVE REPORT   Report Status PENDING   Incomplete     Studies: No results found.  Scheduled Meds: . aspirin EC  81 mg Oral Daily  . chlorpheniramine-HYDROcodone  5 mL Oral Q12H  . cinacalcet  30 mg Oral Q breakfast  . [START ON 08/25/2012] darbepoetin (ARANESP) injection - DIALYSIS  100 mcg Intravenous Q Thu-HD  . docusate sodium  100 mg Oral BID  . feeding supplement (NEPRO CARB STEADY)  237 mL Oral BID WC  . ferric gluconate (FERRLECIT/NULECIT) IV  125 mg Intravenous Q T,Th,Sa-HD  . insulin aspart  0-9 Units Subcutaneous TID WC  . meropenem (MERREM) IV  500 mg Intravenous Q2000  . methadone  10 mg Oral QID  . metoprolol tartrate  12.5 mg Oral BID  . multivitamin  1 tablet Oral QHS  . pantoprazole  40 mg Oral Daily  . sevelamer  400 mg Oral TID WC  . simvastatin  10 mg Oral q1800   Continuous Infusions:    [name],Dorothy  [facility] Pager [phone].  If 8PM-8AM, please contact night-coverage at [email or web address], password [facility] 08/24/2012, 11:31 AM  LOS: 11 days

## 2012-08-24 NOTE — Progress Notes (Addendum)
Subjective: Co s some  Abdominal pain, tolerarated oatmeal and hd yesrteday Objective Vital signs in last 24 hours: Filed Vitals:   08/23/12 1500 08/23/12 1723 08/23/12 2106 08/24/12 0521  BP: 99/59 102/62 107/71 117/76  Pulse: 81 88 85 95  Temp: 97.2 F (36.2 C) 97.4 F (36.3 C) 98.2 F (36.8 C) 98.5 F (36.9 C)  TempSrc: Tympanic  Oral Oral  Resp: 20 20 20 20   Height:   5\' 4"  (1.626 m)   Weight:   48.3 kg (106 lb 7.7 oz)   SpO2: 98% 98% 97% 97%   Weight change: -2.4 kg (-5 lb 4.7 oz)  Intake/Output Summary (Last 24 hours) at 08/24/12 0904 Last data filed at 08/24/12 0500  Gross per 24 hour  Intake   1500 ml  Output  -2485 ml  Net   3985 ml   Labs: Basic Metabolic Panel:  Recent Labs Lab 08/18/12 1344 08/20/12 0516 08/21/12 0500 08/22/12 0655 08/23/12 0642  NA 131* 129*  --  130* 135  K 3.9 3.8  --  4.3 3.3*  CL 94* 93*  --  94* 98  CO2 27 29  --  27 30  GLUCOSE 221* 58*  --  99 59*  BUN 37* 31*  --  31* 18  CREATININE 2.52* 2.42* 1.68* 2.37* 1.68*  CALCIUM 10.5 11.2*  --  11.0* 9.5  PHOS 3.4 3.7  --   --  3.1   Liver Function Tests:  Recent Labs Lab 08/20/12 0516 08/22/12 0655 08/23/12 0642  AST  --  17  --   ALT  --  10  --   ALKPHOS  --  160*  --   BILITOT  --  0.4  --   PROT  --  6.6  --   ALBUMIN 1.5* 1.5* 1.2*   No results found for this basename: LIPASE, AMYLASE,  in the last 168 hours No results found for this basename: AMMONIA,  in the last 168 hours CBC:  Recent Labs Lab 08/18/12 1344 08/19/12 1021 08/20/12 0500 08/22/12 0655 08/23/12 0455  WBC 16.4* 13.4* 13.6* 14.8* 17.1*  HGB 9.0* 8.8* 8.4* 8.7* 8.7*  HCT 28.1* 28.9* 27.0* 28.2* 28.2*  MCV 90.9 94.1 93.8 94.0 93.7  PLT 193 PLATELET CLUMPS NOTED ON SMEAR, COUNT APPEARS ADEQUATE 185 182 183   Cardiac Enzymes: No results found for this basename: CKTOTAL, CKMB, CKMBINDEX, TROPONINI,  in the last 168 hours CBG:  Recent Labs Lab 08/23/12 1120 08/23/12 1219 08/23/12 1628  08/23/12 2110 08/24/12 0751  GLUCAP 39* 70 153* 103* 153*    Iron Studies: No results found for this basename: IRON, TIBC, TRANSFERRIN, FERRITIN,  in the last 72 hours Studies/Results: No results found. Medications:   . ampicillin (OMNIPEN) IV  1 g Intravenous Q6H  . aspirin EC  81 mg Oral Daily  . chlorpheniramine-HYDROcodone  5 mL Oral Q12H  . cinacalcet  30 mg Oral Q breakfast  . [START ON 08/25/2012] darbepoetin (ARANESP) injection - DIALYSIS  100 mcg Intravenous Q Thu-HD  . docusate sodium  100 mg Oral BID  . feeding supplement (NEPRO CARB STEADY)  237 mL Oral BID WC  . ferric gluconate (FERRLECIT/NULECIT) IV  125 mg Intravenous Q T,Th,Sa-HD  . insulin aspart  0-9 Units Subcutaneous TID WC  . meropenem (MERREM) IV  500 mg Intravenous Q2000  . methadone  10 mg Oral QID  . metoprolol tartrate  12.5 mg Oral BID  . micafungin [facility]) IV  100 mg Intravenous  Daily  . multivitamin  1 tablet Oral QHS  . pantoprazole  40 mg Oral Daily  . sevelamer  400 mg Oral TID WC  . simvastatin  10 mg Oral q1800   I  have reviewed scheduled and prn medications.  Physical Exam: General: Alert, NAD , Talkative Heart: RRR, 1/6 sem lsb Lungs: Decreased bs  And rare  Rale Left lower Abdomen: BS positive, slight distension, slightly tender Left ;lower and Left upper quad ,no rebound Extremities: Dialysis Access: Bipedal 2+ pedal edema/ Right ij perm cath   Dialysis Orders: Center: [facility] on TTS.  EDW 53.5 kg HD Bath 3K/2.5Ca Time 4hrs Heparin 1600 U. Access Right IJ catheter BFR 400 DFR 800 Hectorol 0 mcg IV/HD Epogen 1500 Units IV/HD Venofer 0   Assessment/Plan:  1. Chronic pancreatitis/abd pain/infected pancreatic fluid- fluid aspirate growing GNR's, ID changed abx to meropenem. CCS following , Pt wanting Paracentesis "To Drain that Fluid built up causing my pain" Pt refuses any abd surgery. Medical Rx 2. ESRD - TTS HD; chronic hyponatremia secondary to volume overload and nonadherence with  outpt HD 3. Anemia - Hgb 8.7 s/p 2 units on 3/2; . Aranesp 100. Tsat 22% and Ferritin 1513 on 2/20. IV fe. 4. Secondary hyperparathyroidism / hypercalcemia - P controlled; no hectorol; corrected Ca 13. Low Ca bath. Renvela.- add sensipar 30  5. HTN/volume/ascities - BP 117/76 on metoprolol 12.5 bid. 2486 uf yesterday on hd, post wt48.3 ,She is below her dry wt/ on admission. +LE edema, max UF as tolerated with HD in am 6. Nutrition - Albumin 1.2. (Has PC) . Reg diet With fluid restriction, Protein supplement 7. Chronic systolic CHF, NYHA class 3 - per primary also severe MR from last echo done in Mar '14 8. DNR   Lenny Pastel, PA-C [facility] [phone] 08/24/2012,9:04 AM  LOS: 11 days   Patient seen and examined.  Agree with assessment and plan as above. Vinson Moselle  MD [phone] pgr    [phone] cell 08/24/2012, 11:57 AM

## 2012-08-24 NOTE — Progress Notes (Addendum)
[facility]  Date of Admission:  08/13/2012  Antibiotics: meropenem  Subjective: No acute events  Objective: Temp:  [97.4 F (36.3 C)-98.5 F (36.9 C)] 98 F (36.7 C) (03/12 1451) Pulse Rate:  [85-96] 96 (03/12 1451) Resp:  [20] 20 (03/12 1451) BP: (102-122)/(62-80) 120/80 mmHg (03/12 1451) SpO2:  [95 %-98 %] 98 % (03/12 1451) Weight:  [106 lb 7.7 oz (48.3 kg)] 106 lb 7.7 oz (48.3 kg) (03/11 2106)  General: chronically ill appearing Abdomen: mild tenderness, soft   Lab Results Lab Results  Component Value Date   WBC 12.3* 08/24/2012   HGB 9.2* 08/24/2012   HCT 29.6* 08/24/2012   MCV 92.2 08/24/2012   PLT 207 08/24/2012    Lab Results  Component Value Date   CREATININE 1.68* 08/23/2012   BUN 18 08/23/2012   NA 135 08/23/2012   K 3.3* 08/23/2012   CL 98 08/23/2012   CO2 30 08/23/2012    Lab Results  Component Value Date   ALT 10 08/22/2012   AST 17 08/22/2012   ALKPHOS 160* 08/22/2012   BILITOT 0.4 08/22/2012      Microbiology: Recent Results (from the past 240 hour(s))  URINE CULTURE     Status: None   Collection Time    08/14/12  8:06 PM      Result Value Range Status   Specimen Description URINE, CLEAN CATCH   Final   Special Requests NONE   Final   Culture  Setup Time 08/15/2012 01:17   Final   Colony Count >=100,000 COLONIES/ML   Final   Culture     Final   Value: VANCOMYCIN RESISTANT ENTEROCOCCUS ISOLATED     Note: CRITICAL RESULT CALLED TO, READ BACK BY AND VERIFIED WITH: KATHY RICKER @ 0320 ON 08/19/2012   Report Status 08/19/2012 FINAL   Final   Organism ID, Bacteria VANCOMYCIN RESISTANT ENTEROCOCCUS ISOLATED   Final  CULTURE, ROUTINE-ABSCESS     Status: None   Collection Time    08/20/12  2:14 PM      Result Value Range Status   Specimen Description ABSCESS   Final   Special Requests CT GUIDED ASPIRATION OF PANCREATIC PSEUDOCYST   Final   Gram Stain     Final   Value: ABUNDANT WBC PRESENT, PREDOMINANTLY PMN     NO  SQUAMOUS EPITHELIAL CELLS SEEN     NO ORGANISMS SEEN   Culture MODERATE GRAM NEGATIVE RODS   Final   Report Status PENDING   Incomplete  CULTURE, BLOOD (ROUTINE X 2)     Status: None   Collection Time    08/20/12  9:45 PM      Result Value Range Status   Specimen Description BLOOD LEFT ARM   Final   Special Requests BOTTLES DRAWN AEROBIC AND ANAEROBIC 10CC EACH   Final   Culture  Setup Time 08/21/2012 15:55   Final   Culture     Final   Value:        BLOOD CULTURE RECEIVED NO GROWTH TO DATE CULTURE WILL BE HELD FOR 5 DAYS BEFORE ISSUING A FINAL NEGATIVE REPORT   Report Status PENDING   Incomplete  CULTURE, BLOOD (ROUTINE X 2)     Status: None   Collection Time    08/20/12 10:00 PM      Result Value Range Status   Specimen Description BLOOD LEFT HAND   Final   Special Requests BOTTLES DRAWN AEROBIC AND ANAEROBIC 10CC EACH  Final   Culture  Setup Time 08/21/2012 15:54   Final   Culture     Final   Value:        BLOOD CULTURE RECEIVED NO GROWTH TO DATE CULTURE WILL BE HELD FOR 5 DAYS BEFORE ISSUING A FINAL NEGATIVE REPORT   Report Status PENDING   Incomplete    Studies/Results: No results found.  Assessment/Plan: 1) infected pseudocyst - growing GNR, awaiting ID to see if antibiotics can be changed.  History of Candida and Enterococcus, but none at this time.    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

## 2012-08-25 ENCOUNTER — Encounter: Payer: Self-pay | Admitting: Infectious Diseases

## 2012-08-25 ENCOUNTER — Inpatient Hospital Stay (HOSPITAL_COMMUNITY): Payer: Medicare Other

## 2012-08-25 DIAGNOSIS — K861 Other chronic pancreatitis: Secondary | ICD-10-CM

## 2012-08-25 LAB — RENAL FUNCTION PANEL
Albumin: 1.6 g/dL — ABNORMAL LOW (ref 3.5–5.2)
BUN: 31 mg/dL — ABNORMAL HIGH (ref 6–23)
Chloride: 98 mEq/L (ref 96–112)
GFR calc Af Amer: 27 mL/min — ABNORMAL LOW (ref >90)
Glucose, Bld: 83 mg/dL (ref 70–99)
Potassium: 4.1 mEq/L (ref 3.5–5.1)
Sodium: 134 mEq/L — ABNORMAL LOW (ref 135–145)

## 2012-08-25 LAB — CBC
HCT: 28.9 % — ABNORMAL LOW (ref 36.0–46.0)
Hemoglobin: 8.9 g/dL — ABNORMAL LOW (ref 12.0–15.0)
WBC: 17.2 10*3/uL — ABNORMAL HIGH (ref 4.0–10.5)

## 2012-08-25 LAB — GLUCOSE, CAPILLARY
Glucose-Capillary: 65 mg/dL — ABNORMAL LOW (ref 70–99)
Glucose-Capillary: 109 mg/dL — ABNORMAL HIGH (ref 70–99)

## 2012-08-25 MED ORDER — MENTHOL 3 MG MT LOZG
1.0000 | LOZENGE | OROMUCOSAL | Status: DC | PRN
  Administered 2012-08-25: 3 mg via ORAL
  Filled 2012-08-25 (×2): qty 9

## 2012-08-25 MED ORDER — COLISTIMETHATE SODIUM 150 MG IJ SOLR
1.5000 mg/kg/d | INTRAMUSCULAR | Status: DC
  Administered 2012-08-25 – 2012-09-02 (×9): 69.75 mg via INTRAVENOUS
  Filled 2012-08-25 (×10): qty 0.93

## 2012-08-25 MED ORDER — HYDROMORPHONE HCL PF 1 MG/ML IJ SOLN
INTRAMUSCULAR | Status: AC
  Administered 2012-08-25: 1 mg via INTRAVENOUS
  Filled 2012-08-25: qty 1

## 2012-08-25 MED ORDER — DARBEPOETIN ALFA-POLYSORBATE 100 MCG/0.5ML IJ SOLN
INTRAMUSCULAR | Status: AC
  Administered 2012-08-25: 100 ug via INTRAVENOUS
  Filled 2012-08-25: qty 0.5

## 2012-08-25 NOTE — Progress Notes (Signed)
I have seen and examined the pt and agree with PA-Osborne's note Abx per ID Await [place] results

## 2012-08-25 NOTE — Progress Notes (Signed)
Physical Therapy Treatment Patient Details Name: [name] MRN: [id number] DOB: 11-16-1950 Today's Date: 08/25/2012 Time: 7829-5621 PT Time Calculation (min): 37 min  PT Assessment / Plan / Recommendation Comments on Treatment Session  Patient limited with walking this week due to medical issues.  Vocal quality weak, still coughing and c/o worst pain in her life left flank uncontrolled with current pain regimine.  Requesting palliative medicine consult ASAP to deal with pain and feels she needs to see an ENT to help with ears being plugged up.  Don't see how she can manage at home given very low activity tolerance. Despite her desire to return home changing recommendation to SNF.    Follow Up Recommendations  SNF           Equipment Recommendations  Other (comment) (rollator)    Recommendations for Other Services  Palliative medicine; ENT consult  Frequency Min 3X/week   Plan Discharge plan needs to be updated    Precautions / Restrictions Precautions Precautions: Fall Precaution Comments: contact isolation   Pertinent Vitals/Pain C/o left side pain (worst she's had) and RN delivered medication    Mobility  Bed Mobility Bed Mobility: Not assessed Details for Bed Mobility Assistance: up in chair Transfers Sit to Stand: 5: Supervision;With armrests;With upper extremity assist;From chair/3-in-1 Stand to Sit: 5: Supervision;With upper extremity assist;To chair/3-in-1;With armrests Details for Transfer Assistance: needed 2 attempts to successfully rise without assist Ambulation/Gait Ambulation/Gait Assistance: 5: Supervision Ambulation Distance (Feet): 60 Feet Assistive device: Rolling walker Ambulation/Gait Assistance Details: flexed, slow, straightens some with cues, then returns to flexed postue Gait Pattern: Step-through pattern;Decreased stride length;Shuffle;Trunk flexed    Exercises General Exercises - Lower Extremity Ankle Circles/Pumps: AROM;Both;10 reps;Seated      PT Goals Acute Rehab PT Goals Pt will go Sit to Stand: with modified independence;with upper extremity assist PT Goal: Sit to Stand - Progress: Progressing toward goal Pt will Stand: with modified independence;with unilateral upper extremity support;3 - 5 min PT Goal: Stand - Progress: Progressing toward goal Pt will Ambulate: >150 feet;with modified independence;with least restrictive assistive device PT Goal: Ambulate - Progress: Progressing toward goal Pt will Perform Home Exercise Program: with supervision, verbal cues required/provided PT Goal: Perform Home Exercise Program - Progress: Progressing toward goal  Visit Information  Last PT Received On: 08/25/12    Subjective Data  Subjective: Feel weak.  Never had this much pain before.  Want to see palliative medicine and ENT.   Cognition  Cognition Overall Cognitive Status: Appears within functional limits for tasks assessed/performed Arousal/Alertness: Awake/alert Orientation Level: Appears intact for tasks assessed Behavior During Session: [facility] for tasks performed       End of Session PT - End of Session Equipment Utilized During Treatment: Gait belt Activity Tolerance: Patient limited by fatigue Patient left: in chair;with call bell/phone within reach   GP     [facility] 08/25/2012, 3:01 PM [place], [place] [phone] 08/25/2012

## 2012-08-25 NOTE — Progress Notes (Signed)
Received call from lab. Pancreatic pseudocyst culture positive for enterobacter cloacae. Dr Gwenlyn Perking informed.

## 2012-08-25 NOTE — Progress Notes (Signed)
I checked in with pt as per the charge nurse's referral. Pt reported that she was not doing too well today. Pt reported that she had been experiencing a lot of pain before she had undergone a procedure. Pt described the challenges of communicating the reasons for her pain and discomfort with the medical staff. Pt explained that she is seeking palliative care and hopes to enter a dialogue with her children and her healthcare team. 15 minutes into our session, a palliative care nurse entered the room to set up an appointment with the pt. Pt wanted to speak in private with the palliative care nurse, and we ended our session earlier.   Sherol Dade Counselor Intern Haroldine Laws

## 2012-08-25 NOTE — Progress Notes (Signed)
Subjective:  On hd sleeping ,awoken minimal abd pain requiring po pain meds Objective Vital signs in last 24 hours: Filed Vitals:   08/25/12 0649 08/25/12 0701 08/25/12 0731 08/25/12 0800  BP: 118/74 121/81 123/81 122/76  Pulse: 93 90 77 84  Temp:      TempSrc:      Resp: 23 21 21 18   Height:      Weight:      SpO2:       Weight change: 1.2 kg (2 lb 10.3 oz)  Intake/Output Summary (Last 24 hours) at 08/25/12 0838 Last data filed at 08/24/12 2122  Gross per 24 hour  Intake     50 ml  Output      0 ml  Net     50 ml   Labs: Basic Metabolic Panel:  Recent Labs Lab 08/20/12 0516  08/22/12 0655 08/23/12 0642 08/25/12 0645  NA 129*  --  130* 135 134*  K 3.8  --  4.3 3.3* 4.1  CL 93*  --  94* 98 98  CO2 29  --  27 30 29   GLUCOSE 58*  --  99 59* 83  BUN 31*  --  31* 18 31*  CREATININE 2.42*  < > 2.37* 1.68* 2.16*  CALCIUM 11.2*  --  11.0* 9.5 10.7*  PHOS 3.7  --   --  3.1 3.4  < > = values in this interval not displayed. Liver Function Tests:  Recent Labs Lab 08/22/12 0655 08/23/12 0642 08/25/12 0645  AST 17  --   --   ALT 10  --   --   ALKPHOS 160*  --   --   BILITOT 0.4  --   --   PROT 6.6  --   --   ALBUMIN 1.5* 1.2* 1.6*   No results found for this basename: LIPASE, AMYLASE,  in the last 168 hours No results found for this basename: AMMONIA,  in the last 168 hours CBC:  Recent Labs Lab 08/20/12 0500 08/22/12 0655 08/23/12 0455 08/24/12 1217 08/25/12 0646  WBC 13.6* 14.8* 17.1* 12.3* 17.2*  HGB 8.4* 8.7* 8.7* 9.2* 8.9*  HCT 27.0* 28.2* 28.2* 29.6* 28.9*  MCV 93.8 94.0 93.7 92.2 93.2  PLT 185 182 183 207 234   Cardiac Enzymes: No results found for this basename: CKTOTAL, CKMB, CKMBINDEX, TROPONINI,  in the last 168 hours CBG:  Recent Labs Lab 08/24/12 1215 08/24/12 1713 08/24/12 1752 08/24/12 1811 08/24/12 2154  GLUCAP 184* 59* 60* 99 98    Iron Studies: No results found for this basename: IRON, TIBC, TRANSFERRIN, FERRITIN,  in the last  72 hours Studies/Results: No results found. Medications:   . aspirin EC  81 mg Oral Daily  . chlorpheniramine-HYDROcodone  5 mL Oral Q12H  . cinacalcet  30 mg Oral Q breakfast  . darbepoetin (ARANESP) injection - DIALYSIS  100 mcg Intravenous Q Thu-HD  . docusate sodium  100 mg Oral BID  . feeding supplement (NEPRO CARB STEADY)  237 mL Oral BID WC  . ferric gluconate (FERRLECIT/NULECIT) IV  125 mg Intravenous Q T,Th,Sa-HD  . insulin aspart  0-9 Units Subcutaneous TID WC  . meropenem (MERREM) IV  500 mg Intravenous Q2000  . methadone  10 mg Oral QID  . metoprolol tartrate  12.5 mg Oral BID  . multivitamin  1 tablet Oral QHS  . pantoprazole  40 mg Oral Daily  . sevelamer  400 mg Oral TID WC  . simvastatin  10 mg  Oral q1800    Physical Exam:  General: Alert after awoken  On hd, NAD ,thin BF Chronically ill Heart: RRR, 1/6 sem lsb  Lungs: Decreased bs bilaterally Abdomen: BS positive, slight distension, slightly tender Left ;lower and Left upper quad ,no rebound  Extremities: Dialysis Access: Bipedal 2+ pedal edema/ Right ij perm cath  Patent on hd  Dialysis Orders: Center: [facility] on TTS.  EDW 53.5 kg HD Bath 3K/2.5Ca Time 4hrs Heparin 1600 U. Access Right IJ catheter BFR 400 DFR 800 Hectorol 0 mcg IV/HD Epogen 1500 Units IV/HD Venofer 0   Assessment/Plan:  1. Chronic pancreatitis/abd pain/infected pancreatic fluid- fluid aspirate growing enterobacter which is resistant to meropenem and everything else except gent/tobra and Septra. ID to evaluate. Pt refuses any abd surgery- medical Rx 2. ESRD - TTS HD; chronic hyponatremia secondary to volume overload and nonadherence with outpt HD. And Na back to normal with hd 3. Anemia - Hgb 8.9  s/p 2 units on 3/2; . Aranesp 100. Tsat 22% and Ferritin 1513 on 2/20. IV fe. 4. Secondary hyperparathyroidism / hypercalcemia - P controlled; no hectorol; corrected Ca 13. Low Ca bath. Renvela.- added sensipar 30 mg q day 5. HTN/volume/ascities - BP stable  on hd so far this am on metoprolol 12.5 bid. ,She is below her dry wt/ on admission. +LE edema, max UF as tolerated with HD this am using albumin 6. Nutrition - Albumin 1.2.> 1.6 (Has PC) . Reg diet With fluid restriction, Protein supplement 7. Chronic systolic CHF, NYHA class 3 - per primary also severe MR from last echo done in Mar '14 8. DNR  Lenny Pastel, PA-C [facility] [phone] 08/25/2012,8:38 AM  LOS: 12 days   Patient seen and examined.  I agree with plan as above with additions as indicated. Vinson Moselle  MD [phone] pgr    [phone] cell 08/25/2012, 1:56 PM

## 2012-08-25 NOTE — Progress Notes (Signed)
[facility] PROGRESS NOTE Assessment/Plan: *Worsening leukocytosis and fevers due to abdominal abscess: - meropenem d/c as microorganism isolated is resistant to it. Patient afebrile. - ID consulted for antibiotics management. - CBC in am to follow leukocytosis. 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 -surgery consulted for abscess management; will follow recommendations. -leukocytosis trending up.   Chronic systolic congestive heart failure, NYHA class 3 - Cough secondary to vol overload,  Still mildly overloaded. + crackles, but no JVD today. - Received an additional HD treatment  3.6.2014. - Continue metoprolol.  -volume to be manage with HD treatment.    Hyponatremia - resolved with HD    ESRD on hemodialysis - HD TTS. - Received an additional HD treatment  3.6.2014.    Anemia in chronic kidney disease (CKD) - Hemoglobin 7.2 on admission. The patient has received 2 units of PRBCs 08/14/2012.  - Hemoglobin 8.9, will monitor -no acute bleeding    Pancreatitis, chronic - stable    Hypertension - BP soft, especially after HD; but overall stable. Will continue monitoring  Code Status: DNR  Family Communication: no family at bedside  Disposition Plan: to be detrmine; patient wants to go home; per PT she will benefit of SNF and if treatment with daily IV antibiotics is pursuit she might qualify for LTAC.    Consultants:  Nephrology  ID  Procedures: Echo 3.5.2014: The estimated ejection fraction was in the range of 20% to 25%, Peak RV-RA gradient: 41mm Hg (S). percutaneous drain by  IR 3.8.2014.   Antibiotics:  vanc zosyn 3.4.2014- 3.5.2014  Meropenem: 3/12-3/13   cholestip  08/25/12 HPI/Subjective Patient afebrile, and denies any nausea/vomiting, reports her abdominal pain persist. Tolerating current diet.  Objective: Filed Vitals:   08/25/12 1040 08/25/12 1055 08/25/12 1127 08/25/12 1425  BP: 108/71 105/69 120/85   Pulse: 83 79 97 110  Temp:  97.5 F (36.4 C) 97.6 F (36.4 C)   TempSrc:  Oral Oral   Resp: 20 19 18    Height:      Weight:  46.6 kg (102 lb 11.8 oz)    SpO2:  100% 97% 94%    Intake/Output Summary (Last 24 hours) at 08/25/12 1548 Last data filed at 08/25/12 1055  Gross per 24 hour  Intake     50 ml  Output  -2982 ml  Net   3032 ml   Filed Weights   08/24/12 2151 08/25/12 0636 08/25/12 1055  Weight: 49.5 kg (109 lb 2 oz) 49.6 kg (109 lb 5.6 oz) 46.6 kg (102 lb 11.8 oz)    Exam:  General: Alert, awake, oriented x3, in no acute distress.  HEENT: No bruits, no goiter.  Heart: Regular rate and rhythm, without murmurs, rubs, gallops.  Lungs: Good air movement, mild crackles bilaterally at bases. Abdomen: Soft, Left side pain, slight distended. Mild rebound to deep palpation. EXT: 1-2+ edema bilaterally    Data Reviewed: Basic Metabolic Panel:  Recent Labs Lab 08/20/12 0516 08/21/12 0500 08/22/12 0655 08/23/12 0642 08/25/12 0645  NA 129*  --  130* 135 134*  K 3.8  --  4.3 3.3* 4.1  CL 93*  --  94* 98 98  CO2 29  --  27 30 29   GLUCOSE 58*  --  99 59* 83  BUN 31*  --  31* 18 31*  CREATININE 2.42* 1.68* 2.37* 1.68* 2.16*  CALCIUM 11.2*  --  11.0* 9.5 10.7*  PHOS 3.7  --   --  3.1 3.4   Liver Function Tests:  Recent Labs Lab 08/20/12 0516 08/22/12 0655 08/23/12 0642 08/25/12 0645  AST  --  17  --   --   ALT  --  10  --   --   ALKPHOS  --  160*  --   --   BILITOT  --  0.4  --   --   PROT  --  6.6  --   --   ALBUMIN 1.5* 1.5* 1.2* 1.6*   CBC:  Recent Labs Lab 08/20/12 0500 08/22/12 0655 08/23/12 0455 08/24/12 1217 08/25/12 0646  WBC 13.6* 14.8* 17.1* 12.3* 17.2*  HGB 8.4* 8.7* 8.7* 9.2* 8.9*  HCT  27.0* 28.2* 28.2* 29.6* 28.9*  MCV 93.8 94.0 93.7 92.2 93.2  PLT 185 182 183 207 234   BNP (last 3 results)  Recent Labs  04/21/12 1419 05/08/12 0515 06/02/12 1944  PROBNP >70000.0* >70000.0* >70000.0*   CBG:  Recent Labs Lab 08/24/12 1752 08/24/12 1811 08/24/12 2154 08/25/12 1123 08/25/12 1227  GLUCAP 60* 99 98 65* 109*    Recent Results (from the past 240 hour(s))  CULTURE, ROUTINE-ABSCESS     Status: None   Collection Time    08/20/12  2:14 PM      Result Value Range Status   Specimen Description ABSCESS   Final   Special Requests CT GUIDED ASPIRATION OF PANCREATIC PSEUDOCYST   Final   Gram Stain     Final   Value: ABUNDANT WBC PRESENT, PREDOMINANTLY PMN     NO SQUAMOUS EPITHELIAL CELLS SEEN     NO ORGANISMS SEEN   Culture     Final   Value: MODERATE ENTEROBACTER CLOACAE     Note: THIS ISOLATE HAS BEEN CONFIRMED AS A  KPC CARBAPENEMASE PRODUCER CRITICAL RESULT CALLED TO, READ BACK BY AND VERIFIED WITH: AL W 3/13 @930  BY REAMM   Report Status PENDING   Incomplete   Organism ID, Bacteria ENTEROBACTER CLOACAE   Final  CULTURE, BLOOD (ROUTINE X 2)     Status: None   Collection Time    08/20/12  9:45 PM      Result Value Range Status   Specimen Description BLOOD LEFT ARM   Final   Special Requests BOTTLES DRAWN AEROBIC AND ANAEROBIC 10CC EACH   Final   Culture  Setup Time 08/21/2012 15:55   Final   Culture     Final   Value:        BLOOD CULTURE RECEIVED NO GROWTH TO DATE CULTURE WILL BE HELD FOR 5 DAYS BEFORE ISSUING A FINAL NEGATIVE REPORT   Report Status PENDING   Incomplete  CULTURE, BLOOD (ROUTINE X 2)     Status: None   Collection Time    08/20/12 10:00 PM      Result Value Range Status   Specimen Description BLOOD LEFT HAND   Final   Special Requests BOTTLES DRAWN AEROBIC AND ANAEROBIC 10CC EACH   Final   Culture  Setup Time 08/21/2012 15:54   Final   Culture     Final   Value:        BLOOD CULTURE RECEIVED NO GROWTH TO DATE CULTURE WILL BE HELD FOR 5  DAYS BEFORE ISSUING A FINAL NEGATIVE REPORT   Report Status PENDING  Incomplete     Studies: No results found.  Scheduled Meds: . aspirin EC  81 mg Oral Daily  . chlorpheniramine-HYDROcodone  5 mL Oral Q12H  . cinacalcet  30 mg Oral Q breakfast  . colistimethate (COLISTIN) IV  1.5 mg/kg/day Intravenous Q24H  . darbepoetin (ARANESP) injection - DIALYSIS  100 mcg Intravenous Q Thu-HD  . docusate sodium  100 mg Oral BID  . feeding supplement (NEPRO CARB STEADY)  237 mL Oral BID WC  . ferric gluconate (FERRLECIT/NULECIT) IV  125 mg Intravenous Q T,Th,Sa-HD  . insulin aspart  0-9 Units Subcutaneous TID WC  . methadone  10 mg Oral QID  . metoprolol tartrate  12.5 mg Oral BID  . multivitamin  1 tablet Oral QHS  . pantoprazole  40 mg Oral Daily  . sevelamer  400 mg Oral TID WC  . simvastatin  10 mg Oral q1800   Continuous Infusions:    MADERA,CARLOS  [facility] Pager [phone].  If 8PM-8AM, please contact night-coverage at [email or web address], password [facility] 08/25/2012, 3:48 PM  LOS: 12 days

## 2012-08-25 NOTE — Progress Notes (Signed)
Thank you for consulting the Palliative Medicine Team at [facility] to meet your patient's and family's needs.   The reason that you asked [place] to see your patient is for goals of care  We have scheduled your patient for a meeting:  Unable to schedule meeting today- discussed meeting with patient who is alert and oriented x4, she stated she remembers talking to the PMT during previous hospitalization and wants to re-meet specifically to give her family an opportunity to process what is going on with her; Flor stated I've been through this before and your team has helped me- she did not want to set a time to meet - times for Friday and Saturday were offered; she wants to speak with her daughter first and will call team phone with time.  The Surrogate decision make is: patient stated she makes her own decisions however she does want her family to be involved with this discussion  Other family members that need to be present: daughter,Tekeshia Little, [phone]; significant other, Ames Coupe [phone]; son [name] by phone [phone]; c: [phone]  PMT will follow up to try to confirm meeting time.   Valente David, RN 08/25/2012, 6:47 PM Palliative Medicine Team RN Liaison [phone]

## 2012-08-25 NOTE — Progress Notes (Signed)
[facility]  Date of Admission:  08/13/2012  Antibiotics: meropenem  Subjective: No acute events; now growing KPC Enterococcal cloacae  Objective: Temp:  [97.3 F (36.3 C)-98.4 F (36.9 C)] 97.6 F (36.4 C) (03/13 1127) Pulse Rate:  [76-109] 97 (03/13 1127) Resp:  [18-24] 18 (03/13 1127) BP: (105-136)/(69-94) 120/85 mmHg (03/13 1127) SpO2:  [92 %-100 %] 97 % (03/13 1127) Weight:  [102 lb 11.8 oz (46.6 kg)-109 lb 5.6 oz (49.6 kg)] 102 lb 11.8 oz (46.6 kg) (03/13 1055)  General: chronically ill appearing Abdomen: mild tenderness, soft   Lab Results Lab Results  Component Value Date   WBC 17.2* 08/25/2012   HGB 8.9* 08/25/2012   HCT 28.9* 08/25/2012   MCV 93.2 08/25/2012   PLT 234 08/25/2012    Lab Results  Component Value Date   CREATININE 2.16* 08/25/2012   BUN 31* 08/25/2012   NA 134* 08/25/2012   K 4.1 08/25/2012   CL 98 08/25/2012   CO2 29 08/25/2012    Lab Results  Component Value Date   ALT 10 08/22/2012   AST 17 08/22/2012   ALKPHOS 160* 08/22/2012   BILITOT 0.4 08/22/2012      Microbiology: Recent Results (from the past 240 hour(s))  CULTURE, ROUTINE-ABSCESS     Status: None   Collection Time    08/20/12  2:14 PM      Result Value Range Status   Specimen Description ABSCESS   Final   Special Requests CT GUIDED ASPIRATION OF PANCREATIC PSEUDOCYST   Final   Gram Stain     Final   Value: ABUNDANT WBC PRESENT, PREDOMINANTLY PMN     NO SQUAMOUS EPITHELIAL CELLS SEEN     NO ORGANISMS SEEN   Culture     Final   Value: MODERATE ENTEROBACTER CLOACAE     Note: THIS ISOLATE HAS BEEN CONFIRMED AS A  KPC CARBAPENEMASE PRODUCER CRITICAL RESULT CALLED TO, READ BACK BY AND VERIFIED WITH: AL W 3/13 @930  BY REAMM   Report Status PENDING   Incomplete   Organism ID, Bacteria ENTEROBACTER CLOACAE   Final  CULTURE, BLOOD (ROUTINE X 2)     Status: None   Collection Time    08/20/12  9:45 PM      Result Value Range Status   Specimen Description  BLOOD LEFT ARM   Final   Special Requests BOTTLES DRAWN AEROBIC AND ANAEROBIC 10CC EACH   Final   Culture  Setup Time 08/21/2012 15:55   Final   Culture     Final   Value:        BLOOD CULTURE RECEIVED NO GROWTH TO DATE CULTURE WILL BE HELD FOR 5 DAYS BEFORE ISSUING A FINAL NEGATIVE REPORT   Report Status PENDING   Incomplete  CULTURE, BLOOD (ROUTINE X 2)     Status: None   Collection Time    08/20/12 10:00 PM      Result Value Range Status   Specimen Description BLOOD LEFT HAND   Final   Special Requests BOTTLES DRAWN AEROBIC AND ANAEROBIC 10CC EACH   Final   Culture  Setup Time 08/21/2012 15:54   Final   Culture     Final   Value:        BLOOD CULTURE RECEIVED NO GROWTH TO DATE CULTURE WILL BE HELD FOR 5 DAYS BEFORE ISSUING A FINAL NEGATIVE REPORT   Report Status PENDING   Incomplete    Studies/Results: No results found.  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  I have dicussed this finding with her and the difficulty of treating this with known antibiotics.  It is gentamicin sensitive but this is not a good option by itself.  Bactrim not known to have good activity in these isolates.   -I will start colistin and get sensitivities of other drugs (aztreonam, tigacycline) -I have discussed with her the difficulty in treating this and the high likelihood that medical therapy with antibiotics will not be successful.  I also feel her pain is possibly related to this abscess.  It is hard to know since she does not want a repeat CT.  I have discussed with her the need to get palliative care involved and she is very receptive to this.  I discussed that comfort may be the best option for her at this time.       Staci Righter, MD [facility] for Infectious Disease [facility] [phone] pager   08/25/2012, 2:00 PM

## 2012-08-25 NOTE — Progress Notes (Signed)
Patient ID: [name], female   DOB: 08-31-1950, 62 y.o.   MRN: [id number]    Subjective: Pt still c/o LUQ abdominal pain today.  No other issues  Objective: Vital signs in last 24 hours: Temp:  [97.3 F (36.3 C)-98.4 F (36.9 C)] 97.3 F (36.3 C) (03/13 0636) Pulse Rate:  [76-109] 84 (03/13 1000) Resp:  [18-24] 22 (03/13 1000) BP: (109-136)/(72-94) 109/76 mmHg (03/13 1000) SpO2:  [92 %-98 %] 92 % (03/13 0636) Weight:  [109 lb 2 oz (49.5 kg)-109 lb 5.6 oz (49.6 kg)] 109 lb 5.6 oz (49.6 kg) (03/13 0636) Last BM Date: 08/24/12  Intake/Output from previous day: 03/12 0701 - 03/13 0700 In: 50 [IV Piggyback:50] Out: -  Intake/Output this shift:    PE: Abd: soft, tender in LUQ, +BS, minimal distention  Lab Results:   Recent Labs  08/24/12 1217 08/25/12 0646  WBC 12.3* 17.2*  HGB 9.2* 8.9*  HCT 29.6* 28.9*  PLT 207 234   BMET  Recent Labs  08/23/12 0642 08/25/12 0645  NA 135 134*  K 3.3* 4.1  CL 98 98  CO2 30 29  GLUCOSE 59* 83  BUN 18 31*  CREATININE 1.68* 2.16*  CALCIUM 9.5 10.7*   PT/INR No results found for this basename: LABPROT, INR,  in the last 72 hours CMP     Component Value Date/Time   NA 134* 08/25/2012 0645   K 4.1 08/25/2012 0645   CL 98 08/25/2012 0645   CO2 29 08/25/2012 0645   GLUCOSE 83 08/25/2012 0645   BUN 31* 08/25/2012 0645   CREATININE 2.16* 08/25/2012 0645   CALCIUM 10.7* 08/25/2012 0645   PROT 6.6 08/22/2012 0655   ALBUMIN 1.6* 08/25/2012 0645   AST 17 08/22/2012 0655   ALT 10 08/22/2012 0655   ALKPHOS 160* 08/22/2012 0655   BILITOT 0.4 08/22/2012 0655   GFRNONAA 23* 08/25/2012 0645   GFRAA 27* 08/25/2012 0645   Lipase     Component Value Date/Time   LIPASE 24 08/13/2012 2142       Studies/Results: No results found.  Anti-infectives: Anti-infectives   Start     Dose/Rate Route Frequency Ordered Stop   08/24/12 2000  meropenem (MERREM) 500 mg in sodium chloride 0.9 % 50 mL IVPB     500 mg 100 mL/hr over 30 Minutes  Intravenous Daily 08/23/12 1024     08/24/12 1200  ampicillin (OMNIPEN) 1 g in sodium chloride 0.9 % 50 mL IVPB  Status:  Discontinued     1 g 150 mL/hr over 20 Minutes Intravenous 4 times per day 08/24/12 0856 08/24/12 1031   08/24/12 1000  micafungin (MYCAMINE) 100 mg in sodium chloride 0.9 % 100 mL IVPB  Status:  Discontinued     100 mg 100 mL/hr over 1 Hours Intravenous Daily 08/24/12 0856 08/24/12 0945   08/23/12 1200  meropenem (MERREM) 500 mg in sodium chloride 0.9 % 50 mL IVPB     500 mg 100 mL/hr over 30 Minutes Intravenous  Once 08/23/12 1023 08/23/12 1230   08/20/12 1200  ceFEPIme (MAXIPIME) 2 g in dextrose 5 % 50 mL IVPB  Status:  Discontinued     2 g 100 mL/hr over 30 Minutes Intravenous Every T-Th-Sa (Hemodialysis) 08/19/12 0736 08/19/12 1614   08/20/12 1200  vancomycin (VANCOCIN) 500 mg in sodium chloride 0.9 % 100 mL IVPB  Status:  Discontinued     500 mg 100 mL/hr over 60 Minutes Intravenous Every T-Th-Sa (Hemodialysis) 08/19/12 0746 08/23/12 1609  08/19/12 1800  micafungin (MYCAMINE) 100 mg in sodium chloride 0.9 % 100 mL IVPB  Status:  Discontinued     100 mg 100 mL/hr over 1 Hours Intravenous Every 24 hours 08/19/12 1639 08/23/12 1609   08/19/12 1700  piperacillin-tazobactam (ZOSYN) IVPB 2.25 g  Status:  Discontinued     2.25 g 100 mL/hr over 30 Minutes Intravenous 3 times per day 08/19/12 1637 08/23/12 1022   08/19/12 1630  voriconazole (VFEND) 210 mg in sodium chloride 0.9 % 100 mL IVPB  Status:  Discontinued     4 mg/kg  51.3 kg 60.5 mL/hr over 120 Minutes Intravenous Every 12 hours 08/19/12 1618 08/19/12 1639   08/19/12 1400  levofloxacin (LEVAQUIN) tablet 500 mg  Status:  Discontinued     500 mg Oral Every 48 hours 08/17/12 1233 08/19/12 1614   08/19/12 0830  ceFEPIme (MAXIPIME) 2 g in dextrose 5 % 50 mL IVPB  Status:  Discontinued     2 g 100 mL/hr over 30 Minutes Intravenous  Once 08/19/12 0736 08/19/12 1625   08/19/12 0830  vancomycin (VANCOCIN) IVPB 1000  mg/200 mL premix     1,000 mg 200 mL/hr over 60 Minutes Intravenous  Once 08/19/12 0746 08/19/12 1727   08/17/12 1400  levofloxacin (LEVAQUIN) tablet 750 mg     750 mg Oral  Once 08/17/12 1233 08/17/12 2218   08/17/12 1230  levofloxacin (LEVAQUIN) tablet 750 mg  Status:  Discontinued     750 mg Oral Every 48 hours 08/17/12 1229 08/17/12 1232   08/16/12 1200  vancomycin (VANCOCIN) 500 mg in sodium chloride 0.9 % 100 mL IVPB  Status:  Discontinued     500 mg 100 mL/hr over 60 Minutes Intravenous Every T-Th-Sa (Hemodialysis) 08/14/12 0128 08/18/12 0848   08/14/12 2030  piperacillin-tazobactam (ZOSYN) IVPB 2.25 g  Status:  Discontinued     2.25 g 100 mL/hr over 30 Minutes Intravenous Every 8 hours 08/14/12 2010 08/18/12 0848   08/14/12 0800  piperacillin-tazobactam (ZOSYN) IVPB 2.25 g  Status:  Discontinued     2.25 g 100 mL/hr over 30 Minutes Intravenous Every 8 hours 08/14/12 0128 08/14/12 2010   08/14/12 0130  vancomycin (VANCOCIN) 1,250 mg in sodium chloride 0.9 % 250 mL IVPB     1,250 mg 166.7 mL/hr over 90 Minutes Intravenous  Once 08/14/12 0128 08/14/12 0359   08/14/12 0130  piperacillin-tazobactam (ZOSYN) IVPB 2.25 g     2.25 g 100 mL/hr over 30 Minutes Intravenous  Once 08/14/12 0128 08/14/12 0259       Assessment/Plan  1. Peripancreatic abscess, s/p aspiration 2. ESRD 3. Multiple medical problems  Plan: 1. Pain persists.  Will get ultrasound to make sure not fluid needs to be drawn off, per her past history of needing these.  If not then, she likely will need a repeat CT scan.   2. cx showed enterobacter which is resistant to meropenem.  i spoke with pharmacy about this and she is going to be awaiting IDs recommendations for further abx therapy.  It is sensitive to gent, which may be the next best option.  Will await IDs recs.  LOS: 12 days    OSBORNE,KELLY E 08/25/2012, 10:09 AM Pager: [phone]

## 2012-08-25 NOTE — Progress Notes (Signed)
Chart review complete.  Patient is not eligible for [facility] Care Management services because her PCP is not a [facility] primary care provider or is not [facility] affiliated.  For any additional questions or new referrals please contact Anibal Henderson BSN RN [facility] Liaison at [phone]

## 2012-08-26 LAB — MAGNESIUM: Magnesium: 2.2 mg/dL (ref 1.5–2.5)

## 2012-08-26 LAB — GLUCOSE, CAPILLARY
Glucose-Capillary: 99 mg/dL (ref 70–99)
Glucose-Capillary: 81 mg/dL (ref 70–99)
Glucose-Capillary: 152 mg/dL — ABNORMAL HIGH (ref 70–99)
Glucose-Capillary: 84 mg/dL (ref 70–99)
Glucose-Capillary: 56 mg/dL — ABNORMAL LOW (ref 70–99)

## 2012-08-26 LAB — BASIC METABOLIC PANEL
CO2: 29 mEq/L (ref 19–32)
Calcium: 10.4 mg/dL (ref 8.4–10.5)
GFR calc Af Amer: 35 mL/min — ABNORMAL LOW (ref >90)
GFR calc non Af Amer: 30 mL/min — ABNORMAL LOW (ref >90)
Sodium: 135 mEq/L (ref 135–145)

## 2012-08-26 LAB — CBC
MCH: 28.7 pg (ref 26.0–34.0)
Platelets: 233 10*3/uL (ref 150–400)
RBC: 3.27 MIL/uL — ABNORMAL LOW (ref 3.87–5.11)
RDW: 17.3 % — ABNORMAL HIGH (ref 11.5–15.5)
WBC: 12.5 10*3/uL — ABNORMAL HIGH (ref 4.0–10.5)

## 2012-08-26 LAB — CULTURE, ROUTINE-ABSCESS

## 2012-08-26 MED ORDER — PRO-STAT SUGAR FREE PO LIQD
30.0000 mL | Freq: Every morning | ORAL | Status: DC
  Administered 2012-08-26 – 2012-09-03 (×8): 30 mL via ORAL
  Filled 2012-08-26 (×10): qty 30

## 2012-08-26 NOTE — Progress Notes (Signed)
Thank you for consulting the Palliative Medicine Team at [facility] to meet your patient's and family's needs.   The reason that you asked [place] to see your patient is for goals of care discussion  We have scheduled your patient for a meeting: tomorrow, Saturday 08/27/12 @  4:00 pm  The Surrogate decision maker is: patient stated she makes her own decision however wants her children involved in this discussion  Other family members that need to be present: Cletis Athens [phone]) daughter and son will participate via phone   Your patient is able/unable to participate: able to participate in discussion A&O x4     Valente David, RN 08/26/2012, 1:01 PM Palliative Medicine Team RN Liaison [phone]

## 2012-08-26 NOTE — Progress Notes (Signed)
Visited with pt who appeared very happy for the visit. Listened empathically as pt talked about upcoming palliative care consult and what has been going on since out last visit.  Pt states she is being told she is dying but wont believe it until God tells her so. Provided emotional and spiritual support.  Will continue to see pt as needed.  Rutherford Nail Chaplain

## 2012-08-26 NOTE — Progress Notes (Signed)
Inpatient Diabetes Program Recommendations  AACE/ADA: New Consensus Statement on Inpatient Glycemic Control (2013)  Target Ranges:  Prepandial:   less than 140 mg/dL      Peak postprandial:   less than 180 mg/dL (1-2 hours)      Critically ill patients:  140 - 180 mg/dL    Results for [name], [name] (MRN [id number]) as of 08/26/2012 11:01  Ref. Range 08/25/2012 11:23 08/25/2012 12:27 08/25/2012 17:42  Glucose-Capillary Latest Range: 70-99 mg/dL 65 (L) 409 (H) 811 (H)   Results for [name], [name] (MRN [id number]) as of 08/26/2012 11:01  Ref. Range 08/26/2012 02:59 08/26/2012 03:47 08/26/2012 08:17  Glucose-Capillary Latest Range: 70-99 mg/dL 53 (L) 99 81   Patient having occasional episodes of hypoglycemia.  MD- Please D/C Novolog Sensitive correction scale (SSI) for now  Will follow. Ambrose Finland RN, MSN, CDE Diabetes Coordinator Inpatient Diabetes Program [phone]

## 2012-08-26 NOTE — Progress Notes (Signed)
Hypoglycemic Event  CBG: 53  Treatment: 15 GM carbohydrate snack  Symptoms: Sweating  Follow-up CBG: Time:0347 CBG Result:99  Possible Reasons for Event: Unknown  Comments/MD notified:None    Gengler, Justine Null  Remember to initiate Hypoglycemia Order Set & complete

## 2012-08-26 NOTE — Progress Notes (Addendum)
I have seen and agree with Dr. Janeece Riggers PN [name] feels much better today. WBC down Adv her diet as tol con't medical treatment as [name] does not want surgical treatment or further dx tests  Please call us back if we can be of further assistance.

## 2012-08-26 NOTE — Progress Notes (Signed)
[facility]  Date of Admission:  08/13/2012  Antibiotics: colistin  Subjective: No acute events, KPC Enterococcal cloacae  Objective: Temp:  [96.3 F (35.7 C)-98.3 F (36.8 C)] 98.3 F (36.8 C) (03/14 1620) Pulse Rate:  [74-89] 89 (03/14 1620) Resp:  [16-18] 16 (03/14 1620) BP: (103-120)/(67-82) 114/82 mmHg (03/14 1620) SpO2:  [97 %-100 %] 99 % (03/14 1620)  General: chronically ill appearing Abdomen: mild tenderness, soft   Lab Results Lab Results  Component Value Date   WBC 12.5* 08/26/2012   HGB 9.4* 08/26/2012   HCT 30.0* 08/26/2012   MCV 91.7 08/26/2012   PLT 233 08/26/2012    Lab Results  Component Value Date   CREATININE 1.74* 08/26/2012   BUN 21 08/26/2012   NA 135 08/26/2012   K 3.7 08/26/2012   CL 99 08/26/2012   CO2 29 08/26/2012    Lab Results  Component Value Date   ALT 10 08/22/2012   AST 17 08/22/2012   ALKPHOS 160* 08/22/2012   BILITOT 0.4 08/22/2012      Microbiology: Recent Results (from the past 240 hour(s))  CULTURE, ROUTINE-ABSCESS     Status: None   Collection Time    08/20/12  2:14 PM      Result Value Range Status   Specimen Description ABSCESS   Final   Special Requests CT GUIDED ASPIRATION OF PANCREATIC PSEUDOCYST   Final   Gram Stain     Final   Value: ABUNDANT WBC PRESENT, PREDOMINANTLY PMN     NO SQUAMOUS EPITHELIAL CELLS SEEN     NO ORGANISMS SEEN   Culture     Final   Value: MODERATE ENTEROBACTER CLOACAE     Note: THIS ISOLATE HAS BEEN CONFIRMED AS A  KPC CARBAPENEMASE PRODUCER CRITICAL RESULT CALLED TO, READ BACK BY AND VERIFIED WITH: AL W 3/13 @930  BY REAMM COLSITIN 0.125ug/mL ETEST results for this drug are "FOR INVESTIGATIONAL USE ONLY" and should NOT be      used for clinical purposes. TIGECYCLINE MIC=4 NO CLSI STANDARDS FOR INTERPRETATION  OF THIS DRUG FOR THIS ORGANISM   Report Status 08/26/2012 FINAL   Final   Organism ID, Bacteria ENTEROBACTER CLOACAE   Final  CULTURE, BLOOD (ROUTINE X 2)      Status: None   Collection Time    08/20/12  9:45 PM      Result Value Range Status   Specimen Description BLOOD LEFT ARM   Final   Special Requests BOTTLES DRAWN AEROBIC AND ANAEROBIC 10CC EACH   Final   Culture  Setup Time 08/21/2012 15:55   Final   Culture     Final   Value:        BLOOD CULTURE RECEIVED NO GROWTH TO DATE CULTURE WILL BE HELD FOR 5 DAYS BEFORE ISSUING A FINAL NEGATIVE REPORT   Report Status PENDING   Incomplete  CULTURE, BLOOD (ROUTINE X 2)     Status: None   Collection Time    08/20/12 10:00 PM      Result Value Range Status   Specimen Description BLOOD LEFT HAND   Final   Special Requests BOTTLES DRAWN AEROBIC AND ANAEROBIC 10CC EACH   Final   Culture  Setup Time 08/21/2012 15:54   Final   Culture     Final   Value:        BLOOD CULTURE RECEIVED NO GROWTH TO DATE CULTURE WILL BE HELD FOR 5 DAYS BEFORE ISSUING A FINAL NEGATIVE REPORT  Report Status PENDING   Incomplete    Studies/Results: US Abdomen Complete  08/25/2012  *RADIOLOGY REPORT*  Clinical Data: Evaluate for ascites.  Abdominal pain.  LIMITED ABDOMINAL ULTRASOUND  Comparison:  CT of the abdomen and pelvis 08/20/2012.  Findings: Limited imaging of the abdomen demonstrated a small amount of ascites scattered throughout the peritoneal cavity.  IMPRESSION: Study is positive for a small volume of ascites.   Original Report Authenticated By: Trudie Reed, M.D.     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  I have dicussed this finding with her and the difficulty of treating this with known antibiotics.  It is gentamicin sensitive but this is not a good option by itself.  Bactrim not known to have good activity in these isolates.   -on colistin that is sensitive according to the MIC.  The tigecycline MIC is not good and it is Aztreonam resistant so little options.  She has improved with the colistin but my concern is that it will not effectively treat the abscess since it is hard  to say how much fluid there is without another CT scan.  My suspicion is that once the colisitin is removed, the WBC will rise again and she will get sick again without removing the focus, but she likely will not tolerate any invasive procedure, if she was even agreeable to.   -I would consider 2 weeks of colistin and either stop and observe clinically or check a CT scan at that time if she was agreeable but that would likely not change anything since aggressive intervention not desired if persistent fluid collection found.  Will defer the discussion to the primary team and palliative care.  Thanks and call with questions.      Staci Righter, MD [facility] [phone] pager   08/26/2012, 5:29 PM

## 2012-08-26 NOTE — Progress Notes (Signed)
Physical Therapy Treatment Patient Details Name: [name] MRN: [id number] DOB: 25-Jan-1951 Today's Date: 08/26/2012 Time: 0750-0806 PT Time Calculation (min): 16 min  PT Assessment / Plan / Recommendation Comments on Treatment Session  Pt in good spirits this morning, talking about preparing for family meeting. She was able to move with supervision only for in room distances and to chair to prepare for breakfast. Recommend that nursing encourage mobility in room and short distance walkiing with RW. Pt is doing well but will need 24/7 supervision at home for safety     Follow Up Recommendations        Does the patient have the potential to tolerate intense rehabilitation     Barriers to Discharge        Equipment Recommendations       Recommendations for Other Services    Frequency     Plan Discharge plan remains appropriate;Frequency remains appropriate    Precautions / Restrictions Precautions Precautions: Fall Precaution Comments: contact isolation   Pertinent Vitals/Pain Pt happy this am, not distracted by pain    Mobility  Bed Mobility Bed Mobility: Not assessed Details for Bed Mobility Assistance: Pt sitting on EOB she states she got there without assist Transfers Transfers: Sit to Stand;Stand to Sit Sit to Stand: 5: Supervision;With armrests;With upper extremity assist;From bed Stand to Sit: 5: Supervision;With upper extremity assist;To chair/3-in-1;With armrests Details for Transfer Assistance: able to stand from bed without difficulty Ambulation/Gait Ambulation/Gait Assistance: 5: Supervision Ambulation Distance (Feet): 30 Feet Ambulation/Gait Assistance Details: pt able to use RW well for supports.  she states she wants a rollator for home Gait Pattern: Step-through pattern;Decreased stride length;Trunk flexed Gait velocity: decreased, but functional for in room walking General Gait Details: better step height today Stairs: No Wheelchair  Mobility Wheelchair Mobility: No    Exercises Other Exercises Other Exercises: standing with RW for several minutes    PT Diagnosis:    PT Problem List:   PT Treatment Interventions:     PT Goals Acute Rehab PT Goals Pt will go Sit to Stand: with modified independence;with upper extremity assist PT Goal: Sit to Stand - Progress: Progressing toward goal Pt will Stand: with modified independence;with unilateral upper extremity support;3 - 5 min PT Goal: Stand - Progress: Progressing toward goal Pt will Ambulate: >150 feet;with modified independence;with least restrictive assistive device PT Goal: Ambulate - Progress: Progressing toward goal Pt will Perform Home Exercise Program: with supervision, verbal cues required/provided  Visit Information  Last PT Received On: 08/26/12 Assistance Needed: +1    Subjective Data  Subjective: Can you help me to the chair? Patient Stated Goal: to have breakfast   Cognition  Cognition Overall Cognitive Status: Appears within functional limits for tasks assessed/performed Arousal/Alertness: Awake/alert Orientation Level: Appears intact for tasks assessed Behavior During Session: [facility] for tasks performed    Balance     End of Session PT - End of Session Activity Tolerance: Patient tolerated treatment well Patient left: in chair (fixing her oatmeal and preparing to eat breakfast)   GP    Bayard Hugger. [place], [place] [phone] 08/26/2012, 8:14 AM

## 2012-08-26 NOTE — Progress Notes (Signed)
[facility] Progress Note  Subjective:   Good spirits, doesn't want PO contrast. Feels like she has gotten mixed messages whether an antibioitic will or will not cover her infection in her abdomen.  Objective Filed Vitals:   08/25/12 2045 08/26/12 0154 08/26/12 0545 08/26/12 0924  BP: 109/77 116/79 116/78 103/67  Pulse: 86 78 74 76  Temp: 98.1 F (36.7 C) 97.6 F (36.4 C) 98.1 F (36.7 C) 97.6 F (36.4 C)  TempSrc: Oral Oral Oral Oral  Resp: 18 16 16 16   Height:      Weight:      SpO2: 100% 98% 97% 98%   Physical Exam General: Talkative sitting in chair. Enjoying the view from her room Heart: RRR Lungs: no wheezes or rales; decreased BS Abdomen: soft; slight L sided tenderness Extremities: + LE edema improving Dialysis Access: right I-J  Dialysis Orders: Center: [facility] on TTS.  EDW 53.5 kg HD Bath 3K/2.5Ca Time 4hrs Heparin 1600 U. Access Right IJ catheter BFR 400 DFR 800 Hectorol 0 mcg IV/HD Epogen 1500 Units IV/HD Venofer 0   Assessment/Plan: 1. Infected pseudocyst - E. Cloacae KPC - colistin started; sensitivities of alternative drugs being checked. Dr. Luciana Axe following. Palliative care following. She is receptive to their assistance, but not ready to stop dialysis 2. ESRD -  TTS - hyponatremia improved with dialysis and decreased volume - HD Sat and continue to challenge volume 3. Anemia - Hgb 9.4 trending up. Aranesp 100; repleteing iron 4. Secondary hyperparathyroidism - hypercalcemia - can't use 2 Ca bath due to low K, using 2.25; increase sensipar to 60 if Ca doesn't decrease much. 5. HTN/volume - BP stable; have decreased volume significantly this week with extra treatment; 2.6 3/10, 2.49 3/11 and 2.98 3/13 with post weght of 46.6; continue to challange 6. Protein calorie malnutrition - regular diet with protein supplement and nepro 7.  Chronic systolic CHF, NYHA class 3 - per primary also severe MR from last echo done in Mar '14 8.  DNR   Sheffield Slider, PA-C [facility] [phone] 08/26/2012,9:33 AM  LOS: 13 days   Patient seen and examined.  Agree with assessment and plan as above. Vinson Moselle  MD [phone] pgr    [phone] cell 08/26/2012, 4:22 PM   Additional Objective Labs: Basic Metabolic Panel:  Recent Labs Lab 08/20/12 0516  08/23/12 0642 08/25/12 0645 08/26/12 0615  NA 129*  < > 135 134* 135  K 3.8  < > 3.3* 4.1 3.7  CL 93*  < > 98 98 99  CO2 29  < > 30 29 29   GLUCOSE 58*  < > 59* 83 78  BUN 31*  < > 18 31* 21  CREATININE 2.42*  < > 1.68* 2.16* 1.74*  CALCIUM 11.2*  < > 9.5 10.7* 10.4  PHOS 3.7  --  3.1 3.4  --   < > = values in this interval not displayed. Liver Function Tests:  Recent Labs Lab 08/22/12 0655 08/23/12 0642 08/25/12 0645  AST 17  --   --   ALT 10  --   --   ALKPHOS 160*  --   --   BILITOT 0.4  --   --   PROT 6.6  --   --   ALBUMIN 1.5* 1.2* 1.6*   CBC:  Recent Labs Lab 08/22/12 0655 08/23/12 0455 08/24/12 1217 08/25/12 0646 08/26/12 0615  WBC 14.8* 17.1* 12.3* 17.2* 12.5*  HGB 8.7* 8.7* 9.2* 8.9* 9.4*  HCT 28.2* 28.2* 29.6* 28.9* 30.0*  MCV 94.0 93.7 92.2 93.2 91.7  PLT 182 183 207 234 233   Blood Culture    Component Value Date/Time   SDES BLOOD LEFT HAND 08/20/2012 2200   SPECREQUEST BOTTLES DRAWN AEROBIC AND ANAEROBIC 10CC EACH 08/20/2012 2200   CULT        BLOOD CULTURE RECEIVED NO GROWTH TO DATE CULTURE WILL BE HELD FOR 5 DAYS BEFORE ISSUING A FINAL NEGATIVE REPORT 08/20/2012 2200   REPTSTATUS PENDING 08/20/2012 2200   CBG:  Recent Labs Lab 08/25/12 1227 08/25/12 1742 08/26/12 0259 08/26/12 0347 08/26/12 0817  GLUCAP 109* 132* 53* 99 81  Studies/Results: US Abdomen Complete  08/25/2012  *RADIOLOGY REPORT*  Clinical Data: Evaluate for ascites.  Abdominal pain.  LIMITED ABDOMINAL ULTRASOUND  Comparison:  CT of the abdomen and pelvis 08/20/2012.  Findings: Limited imaging of the abdomen demonstrated a small amount of ascites scattered  throughout the peritoneal cavity.  IMPRESSION: Study is positive for a small volume of ascites.   Original Report Authenticated By: Trudie Reed, M.D.    Medications:   . aspirin EC  81 mg Oral Daily  . chlorpheniramine-HYDROcodone  5 mL Oral Q12H  . cinacalcet  30 mg Oral Q breakfast  . colistimethate (COLISTIN) IV  1.5 mg/kg/day Intravenous Q24H  . darbepoetin (ARANESP) injection - DIALYSIS  100 mcg Intravenous Q Thu-HD  . docusate sodium  100 mg Oral BID  . feeding supplement (NEPRO CARB STEADY)  237 mL Oral BID WC  . ferric gluconate (FERRLECIT/NULECIT) IV  125 mg Intravenous Q T,Th,Sa-HD  . insulin aspart  0-9 Units Subcutaneous TID WC  . methadone  10 mg Oral QID  . metoprolol tartrate  12.5 mg Oral BID  . multivitamin  1 tablet Oral QHS  . pantoprazole  40 mg Oral Daily  . sevelamer  400 mg Oral TID WC  . simvastatin  10 mg Oral q1800

## 2012-08-26 NOTE — Progress Notes (Signed)
Patient ID: [name], female   DOB: 1951-05-16, 62 y.o.   MRN: [id number]    Subjective: Feeling better overall.  still c/o LUQ abdominal pain.  Not willing to take PO contrast for CT scan.  No other issues  Objective: Vital signs in last 24 hours: Temp:  [96.3 F (35.7 C)-98.1 F (36.7 C)] 98.1 F (36.7 C) (03/14 0545) Pulse Rate:  [74-110] 74 (03/14 0545) Resp:  [16-22] 16 (03/14 0545) BP: (105-123)/(69-85) 116/78 mmHg (03/14 0545) SpO2:  [94 %-100 %] 97 % (03/14 0545) Weight:  [102 lb 11.8 oz (46.6 kg)] 102 lb 11.8 oz (46.6 kg) (03/13 1055) Last BM Date: 08/25/12  Intake/Output from previous day: 03/13 0701 - 03/14 0700 In: 350.9 [P.O.:240; IV Piggyback:100.9] Out: -2982  Intake/Output this shift:    PE: Abd: soft, tender in LUQ, +BS, minimal distention, no appreciable fluid wave  Lab Results:   Recent Labs  08/25/12 0646 08/26/12 0615  WBC 17.2* 12.5*  HGB 8.9* 9.4*  HCT 28.9* 30.0*  PLT 234 233   BMET  Recent Labs  08/25/12 0645  NA 134*  K 4.1  CL 98  CO2 29  GLUCOSE 83  BUN 31*  CREATININE 2.16*  CALCIUM 10.7*   PT/INR No results found for this basename: LABPROT, INR,  in the last 72 hours CMP     Component Value Date/Time   NA 134* 08/25/2012 0645   K 4.1 08/25/2012 0645   CL 98 08/25/2012 0645   CO2 29 08/25/2012 0645   GLUCOSE 83 08/25/2012 0645   BUN 31* 08/25/2012 0645   CREATININE 2.16* 08/25/2012 0645   CALCIUM 10.7* 08/25/2012 0645   PROT 6.6 08/22/2012 0655   ALBUMIN 1.6* 08/25/2012 0645   AST 17 08/22/2012 0655   ALT 10 08/22/2012 0655   ALKPHOS 160* 08/22/2012 0655   BILITOT 0.4 08/22/2012 0655   GFRNONAA 23* 08/25/2012 0645   GFRAA 27* 08/25/2012 0645   Lipase     Component Value Date/Time   LIPASE 24 08/13/2012 2142       Studies/Results: US Abdomen Complete  08/25/2012  *RADIOLOGY REPORT*  Clinical Data: Evaluate for ascites.  Abdominal pain.  LIMITED ABDOMINAL ULTRASOUND  Comparison:  CT of the abdomen and pelvis  08/20/2012.  Findings: Limited imaging of the abdomen demonstrated a small amount of ascites scattered throughout the peritoneal cavity.  IMPRESSION: Study is positive for a small volume of ascites.   Original Report Authenticated By: Trudie Reed, M.D.     Anti-infectives: Anti-infectives   Start     Dose/Rate Route Frequency Ordered Stop   08/25/12 1500  colistimethate (COLYMYCIN) 69.75 mg in sodium chloride 0.9 % 100 mL IVPB     1.5 mg/kg/day  46.6 kg 201.9 mL/hr over 30 Minutes Intravenous Every 24 hours 08/25/12 1400     08/24/12 2000  meropenem (MERREM) 500 mg in sodium chloride 0.9 % 50 mL IVPB  Status:  Discontinued     500 mg 100 mL/hr over 30 Minutes Intravenous Daily 08/23/12 1024 08/25/12 1400   08/24/12 1200  ampicillin (OMNIPEN) 1 g in sodium chloride 0.9 % 50 mL IVPB  Status:  Discontinued     1 g 150 mL/hr over 20 Minutes Intravenous 4 times per day 08/24/12 0856 08/24/12 1031   08/24/12 1000  micafungin (MYCAMINE) 100 mg in sodium chloride 0.9 % 100 mL IVPB  Status:  Discontinued     100 mg 100 mL/hr over 1 Hours Intravenous Daily 08/24/12 0856 08/24/12 0945  08/23/12 1200  meropenem (MERREM) 500 mg in sodium chloride 0.9 % 50 mL IVPB     500 mg 100 mL/hr over 30 Minutes Intravenous  Once 08/23/12 1023 08/23/12 1230   08/20/12 1200  ceFEPIme (MAXIPIME) 2 g in dextrose 5 % 50 mL IVPB  Status:  Discontinued     2 g 100 mL/hr over 30 Minutes Intravenous Every T-Th-Sa (Hemodialysis) 08/19/12 0736 08/19/12 1614   08/20/12 1200  vancomycin (VANCOCIN) 500 mg in sodium chloride 0.9 % 100 mL IVPB  Status:  Discontinued     500 mg 100 mL/hr over 60 Minutes Intravenous Every T-Th-Sa (Hemodialysis) 08/19/12 0746 08/23/12 1609   08/19/12 1800  micafungin (MYCAMINE) 100 mg in sodium chloride 0.9 % 100 mL IVPB  Status:  Discontinued     100 mg 100 mL/hr over 1 Hours Intravenous Every 24 hours 08/19/12 1639 08/23/12 1609   08/19/12 1700  piperacillin-tazobactam (ZOSYN) IVPB 2.25 g   Status:  Discontinued     2.25 g 100 mL/hr over 30 Minutes Intravenous 3 times per day 08/19/12 1637 08/23/12 1022   08/19/12 1630  voriconazole (VFEND) 210 mg in sodium chloride 0.9 % 100 mL IVPB  Status:  Discontinued     4 mg/kg  51.3 kg 60.5 mL/hr over 120 Minutes Intravenous Every 12 hours 08/19/12 1618 08/19/12 1639   08/19/12 1400  levofloxacin (LEVAQUIN) tablet 500 mg  Status:  Discontinued     500 mg Oral Every 48 hours 08/17/12 1233 08/19/12 1614   08/19/12 0830  ceFEPIme (MAXIPIME) 2 g in dextrose 5 % 50 mL IVPB  Status:  Discontinued     2 g 100 mL/hr over 30 Minutes Intravenous  Once 08/19/12 0736 08/19/12 1625   08/19/12 0830  vancomycin (VANCOCIN) IVPB 1000 mg/200 mL premix     1,000 mg 200 mL/hr over 60 Minutes Intravenous  Once 08/19/12 0746 08/19/12 1727   08/17/12 1400  levofloxacin (LEVAQUIN) tablet 750 mg     750 mg Oral  Once 08/17/12 1233 08/17/12 2218   08/17/12 1230  levofloxacin (LEVAQUIN) tablet 750 mg  Status:  Discontinued     750 mg Oral Every 48 hours 08/17/12 1229 08/17/12 1232   08/16/12 1200  vancomycin (VANCOCIN) 500 mg in sodium chloride 0.9 % 100 mL IVPB  Status:  Discontinued     500 mg 100 mL/hr over 60 Minutes Intravenous Every T-Th-Sa (Hemodialysis) 08/14/12 0128 08/18/12 0848   08/14/12 2030  piperacillin-tazobactam (ZOSYN) IVPB 2.25 g  Status:  Discontinued     2.25 g 100 mL/hr over 30 Minutes Intravenous Every 8 hours 08/14/12 2010 08/18/12 0848   08/14/12 0800  piperacillin-tazobactam (ZOSYN) IVPB 2.25 g  Status:  Discontinued     2.25 g 100 mL/hr over 30 Minutes Intravenous Every 8 hours 08/14/12 0128 08/14/12 2010   08/14/12 0130  vancomycin (VANCOCIN) 1,250 mg in sodium chloride 0.9 % 250 mL IVPB     1,250 mg 166.7 mL/hr over 90 Minutes Intravenous  Once 08/14/12 0128 08/14/12 0359   08/14/12 0130  piperacillin-tazobactam (ZOSYN) IVPB 2.25 g     2.25 g 100 mL/hr over 30 Minutes Intravenous  Once 08/14/12 0128 08/14/12 0259        Assessment/Plan  # Peripancreatic pseudocyst, s/p aspiration - Highly Resistent Enterobacter  # Small Volume Ascites - hx of therapeutic Paracentesis   # VRE UTI  # ESRD - on dialysis  # Multiple medical problems  Plan: 1. Pain persists but seems to be improved.  Tolerating PO well.  + BM yesterday 2. No role for paracentesis at this time given small volume ascites and no abdominal distention 3. ABX per ID - Leukocytosis improved today 4. Noted palliative care involvement.  Appreciate GOC if pt & daughter agreeable and agree she has an overall poor prognosis.  However, with seeming improvement today agree with continued aggressive medical care at this time.  If another setback or clinical worsening feel that comfort care would be most appropriate.   LOS: 13 days   Andrena Mews, DO [facility] Resident - PGY-2 08/26/2012 7:31 AM Pager: [phone]

## 2012-08-26 NOTE — Progress Notes (Signed)
Patient had 5 [place] of [facility] at 0141.  No c/o chest pain or SOB.  VS at 0154 were T-97.6, B/P-116/79, HR-78.  Advised [facility] of above.  Received orders for additional lab work in the AM.  Will continue to monitor patient.  [place], Justine Null.

## 2012-08-26 NOTE — Progress Notes (Signed)
[name] PROGRESS NOTE Assessment/Plan: *Worsening leukocytosis and fevers due to abdominal abscess: - meropenem d/c as microorganism isolated is resistant to it. Patient afebrile. - ID consulted for antibiotics management. - CBC in am to follow leukocytosis. 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 - since still complaining of abdominal pain will get [place] (refusing CT at this moment); also asking to discuss goal of care with palliative medicine. (meeting plan for tomorrow; will follow GOC) -surgery consulted for abscess management; will follow recommendations. -leukocytosis improving -patient better overall.   Chronic systolic congestive heart failure, NYHA class 3 - Cough secondary to vol overload,  Still mildly overloaded. + crackles, but no JVD today. - Continue metoprolol.  -volume to be manage with HD treatment.    Hyponatremia - resolved with HD    ESRD on hemodialysis - Continue HD T/T/S    Anemia in chronic kidney disease (CKD) - Hemoglobin 7.2 on admission. The patient has received 2 units of PRBCs 08/14/2012.  - Hemoglobin remains stable. Will monitor -no acute signs of bleeding    Pancreatitis, chronic - stable    Hypertension - BP soft, especially after HD; but overall stable. Will continue monitoring  Code Status: DNR  Family Communication: no family at bedside  Disposition Plan: to be detrmine; patient wants to go home; per PT she will benefit of SNF and if treatment with daily IV antibiotics is pursuit she might qualify for LTAC.    Consultants:  Nephrology  ID  Procedures: Echo 3.5.2014: The estimated ejection fraction was in the range of 20% to 25%, Peak RV-RA gradient: 41mm Hg (S). percutaneous drain by  IR 3.8.2014.   Antibiotics:  vanc zosyn 3.4.2014- 3.5.2014  Meropenem: 3/12-3/13   colestin  08/25/12  HPI/Subjective Patient afebrile, and denies any nausea/vomiting, reports her abdominal pain persist but she has decided to ignore it. Tolerating diet. Have BM yesterday evening  Objective: Filed Vitals:   08/25/12 2045 08/26/12 0154 08/26/12 0545 08/26/12 0924  BP: 109/77 116/79 116/78 103/67  Pulse: 86 78 74 76  Temp: 98.1 F (36.7 C) 97.6 F (36.4 C) 98.1 F (36.7 C) 97.6 F (36.4 C)  TempSrc: Oral Oral Oral Oral  Resp: 18 16 16 16   Height:      Weight:      SpO2: 100% 98% 97% 98%    Intake/Output Summary (Last 24 hours) at 08/26/12 1345 Last data filed at 08/26/12 0700  Gross per 24 hour  Intake 590.93 ml  Output      0 ml  Net 590.93 ml   Filed Weights   08/24/12 2151 08/25/12 0636 08/25/12 1055  Weight: 49.5 kg (109 lb 2 oz) 49.6 kg (109 lb 5.6 oz) 46.6 kg (102 lb 11.8 oz)    Exam:  General: Alert, awake, oriented x3, in no acute distress.  HEENT: No bruits, no goiter.  Heart: Regular rate and rhythm, without murmurs, rubs, gallops.  Lungs: Good air movement, mild crackles bilaterally at bases. Abdomen: Soft, Left side pain persist, slight distended. Mild rebound to deep palpation. EXT: 1-2+ edema bilaterally    Data Reviewed: Basic Metabolic Panel:  Recent Labs Lab 08/20/12 0516 08/21/12 0500 08/22/12 0655 08/23/12 0642 08/25/12 0645 08/26/12 0615  NA 129*  --  130* 135 134* 135  K 3.8  --  4.3 3.3* 4.1 3.7  CL 93*  --  94* 98 98 99  CO2 29  --  27 30 29 29   GLUCOSE 58*  --  99 59* 83 78  BUN 31*  --  31* 18 31* 21  CREATININE 2.42* 1.68* 2.37* 1.68* 2.16* 1.74*  CALCIUM 11.2*  --  11.0* 9.5 10.7* 10.4  MG  --   --   --   --   --  2.2  PHOS 3.7  --   --  3.1 3.4  --    Liver Function Tests:  Recent Labs Lab 08/20/12 0516 08/22/12 0655 08/23/12 0642 08/25/12 0645  AST  --  17  --   --   ALT  --  10  --   --   ALKPHOS  --  160*  --   --   BILITOT  --  0.4  --   --   PROT  --  6.6  --   --   ALBUMIN 1.5* 1.5* 1.2* 1.6*    CBC:  Recent Labs Lab 08/22/12 0655 08/23/12 0455 08/24/12 1217 08/25/12 0646 08/26/12 0615  WBC 14.8* 17.1* 12.3* 17.2* 12.5*  HGB 8.7* 8.7* 9.2* 8.9* 9.4*  HCT 28.2* 28.2* 29.6* 28.9* 30.0*  MCV 94.0 93.7 92.2 93.2 91.7  PLT 182 183 207 234 233   BNP (last 3 results)  Recent Labs  04/21/12 1419 05/08/12 0515 06/02/12 1944  PROBNP >70000.0* >70000.0* >70000.0*   CBG:  Recent Labs Lab 08/25/12 1742 08/26/12 0259 08/26/12 0347 08/26/12 0817 08/26/12 1154  GLUCAP 132* 53* 99 81 152*    Recent Results (from the past 240 hour(s))  CULTURE, ROUTINE-ABSCESS     Status: None   Collection Time    08/20/12  2:14 PM      Result Value Range Status   Specimen Description ABSCESS   Final   Special Requests CT GUIDED ASPIRATION OF PANCREATIC PSEUDOCYST   Final   Gram Stain     Final   Value: ABUNDANT WBC PRESENT, PREDOMINANTLY PMN     NO SQUAMOUS EPITHELIAL CELLS SEEN     NO ORGANISMS SEEN   Culture     Final   Value: MODERATE ENTEROBACTER CLOACAE     Note: THIS ISOLATE HAS BEEN CONFIRMED AS A  KPC CARBAPENEMASE PRODUCER CRITICAL RESULT CALLED TO, READ BACK BY AND VERIFIED WITH: AL W 3/13 @930  BY REAMM COLSITIN 0.125ug/mL ETEST results for this drug are "FOR INVESTIGATIONAL USE ONLY" and should NOT be      used for clinical purposes. TIGECYCLINE MIC=4 NO CLSI STANDARDS FOR INTERPRETATION  OF THIS DRUG FOR THIS ORGANISM   Report Status 08/26/2012 FINAL   Final   Organism ID, Bacteria ENTEROBACTER CLOACAE   Final  CULTURE, BLOOD (ROUTINE X 2)     Status: None   Collection Time    08/20/12  9:45 PM      Result Value Range Status   Specimen Description BLOOD LEFT ARM   Final   Special Requests BOTTLES DRAWN AEROBIC AND ANAEROBIC 10CC EACH   Final   Culture  Setup Time 08/21/2012 15:55   Final   Culture     Final   Value:        BLOOD CULTURE RECEIVED NO GROWTH TO DATE CULTURE WILL BE HELD FOR 5 DAYS BEFORE ISSUING A FINAL NEGATIVE REPORT   Report Status PENDING    Incomplete  CULTURE, BLOOD (ROUTINE X 2)     Status: None   Collection Time    08/20/12 10:00 PM      Result Value  Range Status   Specimen Description BLOOD LEFT HAND   Final   Special Requests BOTTLES DRAWN AEROBIC AND ANAEROBIC 10CC EACH   Final   Culture  Setup Time 08/21/2012 15:54   Final   Culture     Final   Value:        BLOOD CULTURE RECEIVED NO GROWTH TO DATE CULTURE WILL BE HELD FOR 5 DAYS BEFORE ISSUING A FINAL NEGATIVE REPORT   Report Status PENDING   Incomplete     Studies: US Abdomen Complete  08/25/2012  *RADIOLOGY REPORT*  Clinical Data: Evaluate for ascites.  Abdominal pain.  LIMITED ABDOMINAL ULTRASOUND  Comparison:  CT of the abdomen and pelvis 08/20/2012.  Findings: Limited imaging of the abdomen demonstrated a small amount of ascites scattered throughout the peritoneal cavity.  IMPRESSION: Study is positive for a small volume of ascites.   Original Report Authenticated By: Trudie Reed, M.D.     Scheduled Meds: . aspirin EC  81 mg Oral Daily  . chlorpheniramine-HYDROcodone  5 mL Oral Q12H  . cinacalcet  30 mg Oral Q breakfast  . colistimethate (COLISTIN) IV  1.5 mg/kg/day Intravenous Q24H  . darbepoetin (ARANESP) injection - DIALYSIS  100 mcg Intravenous Q Thu-HD  . docusate sodium  100 mg Oral BID  . feeding supplement (NEPRO CARB STEADY)  237 mL Oral BID WC  . feeding supplement  30 mL Oral q morning - 10a  . ferric gluconate (FERRLECIT/NULECIT) IV  125 mg Intravenous Q T,Th,Sa-HD  . methadone  10 mg Oral QID  . metoprolol tartrate  12.5 mg Oral BID  . multivitamin  1 tablet Oral QHS  . pantoprazole  40 mg Oral Daily  . sevelamer  400 mg Oral TID WC  . simvastatin  10 mg Oral q1800   Continuous Infusions:    MADERA,CARLOS  [name] Pager [phone].  If 8PM-8AM, please contact night-coverage at [email or web address], password [facility] 08/26/2012, 1:45 PM  LOS: 13 days

## 2012-08-27 LAB — RENAL FUNCTION PANEL
Albumin: 1.5 g/dL — ABNORMAL LOW (ref 3.5–5.2)
BUN: 35 mg/dL — ABNORMAL HIGH (ref 6–23)
Calcium: 10.5 mg/dL (ref 8.4–10.5)
GFR calc Af Amer: 22 mL/min — ABNORMAL LOW (ref >90)
Phosphorus: 3.6 mg/dL (ref 2.3–4.6)
Potassium: 3.6 mEq/L (ref 3.5–5.1)
Sodium: 133 mEq/L — ABNORMAL LOW (ref 135–145)

## 2012-08-27 LAB — CBC
MCHC: 30.7 g/dL (ref 30.0–36.0)
RDW: 17.1 % — ABNORMAL HIGH (ref 11.5–15.5)

## 2012-08-27 LAB — GLUCOSE, CAPILLARY
Glucose-Capillary: 91 mg/dL (ref 70–99)
Glucose-Capillary: 95 mg/dL (ref 70–99)

## 2012-08-27 MED ORDER — GABAPENTIN 600 MG PO TABS
300.0000 mg | ORAL_TABLET | Freq: Three times a day (TID) | ORAL | Status: DC
  Filled 2012-08-27 (×2): qty 0.5

## 2012-08-27 MED ORDER — ALBUMIN HUMAN 25 % IV SOLN
INTRAVENOUS | Status: AC
  Administered 2012-08-27: 25 g
  Filled 2012-08-27: qty 100

## 2012-08-27 MED ORDER — LIDOCAINE HCL (PF) 1 % IJ SOLN: 5.0000 mL | INTRAMUSCULAR | Status: DC | PRN

## 2012-08-27 MED ORDER — PENTAFLUOROPROP-TETRAFLUOROETH EX AERO: 1.0000 "application " | INHALATION_SPRAY | CUTANEOUS | Status: DC | PRN

## 2012-08-27 MED ORDER — METOPROLOL TARTRATE 12.5 MG HALF TABLET
12.5000 mg | ORAL_TABLET | Freq: Every day | ORAL | Status: DC
  Administered 2012-08-28: 12.5 mg via ORAL
  Filled 2012-08-27: qty 1

## 2012-08-27 MED ORDER — NEPRO/CARBSTEADY PO LIQD: 237.0000 mL | ORAL | Status: DC | PRN

## 2012-08-27 MED ORDER — HEPARIN SODIUM (PORCINE) 1000 UNIT/ML DIALYSIS: 20.0000 [IU]/kg | INTRAMUSCULAR | Status: DC | PRN

## 2012-08-27 MED ORDER — GLUCOSE 40 % PO GEL
ORAL | Status: AC
  Administered 2012-08-26: 23:00:00
  Filled 2012-08-27: qty 1

## 2012-08-27 MED ORDER — ALTEPLASE 2 MG IJ SOLR: 2.0000 mg | Freq: Once | INTRAMUSCULAR | Status: DC | PRN

## 2012-08-27 MED ORDER — GABAPENTIN 300 MG PO CAPS
300.0000 mg | ORAL_CAPSULE | Freq: Three times a day (TID) | ORAL | Status: DC
  Administered 2012-08-27 – 2012-08-28 (×3): 300 mg via ORAL
  Filled 2012-08-27 (×5): qty 1

## 2012-08-27 MED ORDER — SODIUM CHLORIDE 0.9 % IV SOLN: 100.0000 mL | INTRAVENOUS | Status: DC | PRN

## 2012-08-27 MED ORDER — LIDOCAINE-PRILOCAINE 2.5-2.5 % EX CREA: 1.0000 "application " | TOPICAL_CREAM | CUTANEOUS | Status: DC | PRN

## 2012-08-27 MED ORDER — HYDROMORPHONE HCL PF 1 MG/ML IJ SOLN
INTRAMUSCULAR | Status: AC
  Filled 2012-08-27: qty 1

## 2012-08-27 MED ORDER — ALBUMIN HUMAN 25 % IV SOLN
25.0000 g | Freq: Once | INTRAVENOUS | Status: AC
  Administered 2012-08-27: 25 g via INTRAVENOUS

## 2012-08-27 MED ORDER — GLUCOSE 40 % PO GEL
1.0000 | ORAL | Status: DC | PRN
  Administered 2012-08-28 – 2012-09-01 (×4): 37.5 g via ORAL
  Filled 2012-08-27 (×5): qty 1

## 2012-08-27 MED ORDER — HEPARIN SODIUM (PORCINE) 1000 UNIT/ML DIALYSIS: 1000.0000 [IU] | INTRAMUSCULAR | Status: DC | PRN

## 2012-08-27 NOTE — Progress Notes (Signed)
Subjective:  On hd,eating muffin, states will take po contrast only one cup if ct scan needed. Objective Vital signs in last 24 hours: Filed Vitals:   08/27/12 0504 08/27/12 0757 08/27/12 0810 08/27/12 0830  BP: 108/70 104/71 108/69 95/68  Pulse: 106 77 71 72  Temp: 97.4 F (36.3 C) 97.5 F (36.4 C)    TempSrc: Oral Oral    Resp: 16 14 17 12   Height:      Weight:  47.9 kg (105 lb 9.6 oz)    SpO2: 98% 100%     Weight change: 1.844 kg (4 lb 1.1 oz)  Intake/Output Summary (Last 24 hours) at 08/27/12 0844 Last data filed at 08/27/12 0244  Gross per 24 hour  Intake    240 ml  Output      0 ml  Net    240 ml   Labs: Basic Metabolic Panel:  Recent Labs Lab 08/23/12 0642 08/25/12 0645 08/26/12 0615  NA 135 134* 135  K 3.3* 4.1 3.7  CL 98 98 99  CO2 30 29 29   GLUCOSE 59* 83 78  BUN 18 31* 21  CREATININE 1.68* 2.16* 1.74*  CALCIUM 9.5 10.7* 10.4  PHOS 3.1 3.4  --    Liver Function Tests:  Recent Labs Lab 08/22/12 0655 08/23/12 0642 08/25/12 0645  AST 17  --   --   ALT 10  --   --   ALKPHOS 160*  --   --   BILITOT 0.4  --   --   PROT 6.6  --   --   ALBUMIN 1.5* 1.2* 1.6*   No results found for this basename: LIPASE, AMYLASE,  in the last 168 hours No results found for this basename: AMMONIA,  in the last 168 hours CBC:  Recent Labs Lab 08/22/12 0655 08/23/12 0455 08/24/12 1217 08/25/12 0646 08/26/12 0615  WBC 14.8* 17.1* 12.3* 17.2* 12.5*  HGB 8.7* 8.7* 9.2* 8.9* 9.4*  HCT 28.2* 28.2* 29.6* 28.9* 30.0*  MCV 94.0 93.7 92.2 93.2 91.7  PLT 182 183 207 234 233   Cardiac Enzymes: No results found for this basename: CKTOTAL, CKMB, CKMBINDEX, TROPONINI,  in the last 168 hours CBG:  Recent Labs Lab 08/26/12 1618 08/26/12 2242 08/26/12 2318 08/27/12 0224 08/27/12 0736  GLUCAP 84 56* 84 79 61*    Iron Studies: No results found for this basename: IRON, TIBC, TRANSFERRIN, FERRITIN,  in the last 72 hours Studies/Results: US Abdomen  Complete  08/25/2012  *RADIOLOGY REPORT*  Clinical Data: Evaluate for ascites.  Abdominal pain.  LIMITED ABDOMINAL ULTRASOUND  Comparison:  CT of the abdomen and pelvis 08/20/2012.  Findings: Limited imaging of the abdomen demonstrated a small amount of ascites scattered throughout the peritoneal cavity.  IMPRESSION: Study is positive for a small volume of ascites.   Original Report Authenticated By: Trudie Reed, M.D.      Physical Exam: General: Alert after awoken On hd, NAD ,thin BF Chronically ill  Heart: RRR, 1/6 sem lsb  Lungs: Decreased bs bilaterally  Abdomen: BS positive, soft,slightly tender Left ;lower and Left upper quad ,no rebound  Extremities: Dialysis Access: Bipedal 3+ pedal edema/ Right ij perm cath Patent on hd   Dialysis Orders: Center: [facility] on TTS.  EDW 53.5 kg HD Bath 3K/2.5Ca Time 4hrs Heparin 1600 U. Access Right IJ catheter BFR 400 DFR 800 Hectorol 0 mcg IV/HD Epogen 1500 Units IV/HD Venofer 0   Assessment/Plan:  1. 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 Luciana Axe noted and Today Meeting  [facility]. pt above agreeable to CT Scan but with her only drinking one cup contrast / CCS following/ wbc pending this am   2. ESRD - TTS HD; her hyponatremia has improve with hd  And volume removal with hd. Tolerating hd today with 3500 goal 3. Anemia - Hgb 8.9> 9.4 s/p 2 units on 3/2; . Aranesp 100. Tsat 22% and Ferritin 1513 on 2/20. IV fe.  4.  Secondary hyperparathyroidism / hypercalcemia -11.8 corrected yesterday/ P controlled; no hectorol;   2.25Ca bath. Renvela.- added sensipar 60 mg q day increased yesterday  5   HTN/volume/ascities - BP stable on hd so far this am on metoprolol 12.5 bid. ,She is below her dry wt/ +LE edema, max UF as tolerated with HD this am using albumin 6  .Nutrition - Albumin 1.2.> 1.6 (Has PC) . Reg diet With fluid restriction, Protein( Nepro) supplement 7  .Chronic systolic CHF, NYHA class  3 - per primary also severe MR from last echo done in Mar '14 8.  DNR- Palliative care meeting today for "Goals of Care"    Lenny Pastel, PA-C [facility] Beeper [phone] 08/27/2012,8:44 AM  LOS: 14 days   Patient seen and examined.  Agree with assessment and plan as above. Vinson Moselle  MD [phone] pgr    [phone] cell 08/27/2012, 11:19 AM

## 2012-08-27 NOTE — Progress Notes (Signed)
Patient upset regarding management of pain, states that IV pain medication is insufficient. MD notified, per MD no changes made d/t pt blood pressure. Will continue to monitor.

## 2012-08-27 NOTE — Progress Notes (Signed)
Hypoglycemic Event  CBG: 56  Treatment: 1 tube instant glucose  Symptoms: Sweaty  Follow-up CBG: Time:2315 CBG Result:86  Possible Reasons for Event: Unknown  Comments/MD notified:    Benay Pike

## 2012-08-27 NOTE — Progress Notes (Signed)
[facility] PROGRESS NOTE Assessment/Plan: *Worsening leukocytosis and fevers due to abdominal abscess: - meropenem d/c as microorganism isolated is resistant to it. Patient afebrile. - ID consulted for antibiotics management. 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 - Will follow meeting results for GOC and decision -surgery consulted for abscess management; will follow recommendations. -leukocytosis improving now 12.7 -patient better overall.   Chronic systolic congestive heart failure, NYHA class 3 - Cough secondary to vol overload,  Mild fluid overloaded. fine crackles, but no JVD. - Continue metoprolol but change to once a day give soft BP.  -volume to be manage with HD treatment.    Hyponatremia - resolved with HD    ESRD on hemodialysis - Continue HD T/T/S    Anemia in chronic kidney disease (CKD) - Hemoglobin 7.2 on admission. The patient has received 2 units of PRBCs 08/14/2012. Hgb 8.9 - Hemoglobin remains stable. Will monitor -no acute signs of bleeding    Pancreatitis, chronic - stable    Hypertension - BP soft, especially after HD; but overall stable.  -Will change metoprolol to once a day  Code Status: DNR  Family Communication: no family at bedside  Disposition Plan: to be detrmine; patient wants to go home; per PT she will benefit of SNF and if treatment with daily IV antibiotics is pursuit she might qualify for LTAC.    Consultants:  Nephrology  ID  Procedures: Echo 3.5.2014: The estimated ejection fraction was in the range of 20% to 25%, Peak RV-RA gradient: 41mm Hg (S). percutaneous drain by  IR 3.8.2014.   Antibiotics:  vanc zosyn 3.4.2014- 3.5.2014  Meropenem: 3/12-3/13   colestin 08/25/12  HPI/Subjective Patient afebrile, and denies any nausea/vomiting. HD today. BP  soft.  Objective: Filed Vitals:   08/27/12 1200 08/27/12 1211 08/27/12 1256 08/27/12 1400  BP: 86/62 94/57 105/68 106/68  Pulse: 70 65 73 78  Temp:  96.8 F (36 C) 98 F (36.7 C) 98.2 F (36.8 C)  TempSrc:  Oral Oral Oral  Resp: 19 14 14 16   Height:      Weight:  47 kg (103 lb 9.9 oz)    SpO2:  98% 99% 98%    Intake/Output Summary (Last 24 hours) at 08/27/12 1405 Last data filed at 08/27/12 1300  Gross per 24 hour  Intake    440 ml  Output    907 ml  Net   -467 ml   Filed Weights   08/26/12 2014 08/27/12 0757 08/27/12 1211  Weight: 48.444 kg (106 lb 12.8 oz) 47.9 kg (105 lb 9.6 oz) 47 kg (103 lb 9.9 oz)    Exam:  General: Alert, awake, oriented x3, in no acute distress.  HEENT: No bruits, no goiter.  Heart: Regular rate and rhythm, without murmurs, rubs, gallops.  Lungs: Good air movement, mild crackles bilaterally at bases. Abdomen: Soft, slight distended. Mild rebound to deep palpation. Still with left side pain EXT: 1+ edema bilaterally    Data Reviewed: Basic Metabolic Panel:  Recent Labs Lab 08/22/12 0655 08/23/12 0642 08/25/12 0645 08/26/12 0615 08/27/12 0900  NA 130* 135 134* 135 133*  K 4.3 3.3* 4.1 3.7 3.6  CL 94* 98 98 99 97  CO2 27 30 29 29 27   GLUCOSE 99 59* 83 78 89  BUN 31* 18 31* 21 35*  CREATININE 2.37* 1.68* 2.16*  1.74* 2.54*  CALCIUM 11.0* 9.5 10.7* 10.4 10.5  MG  --   --   --  2.2  --   PHOS  --  3.1 3.4  --  3.6   Liver Function Tests:  Recent Labs Lab 08/22/12 0655 08/23/12 0642 08/25/12 0645 08/27/12 0900  AST 17  --   --   --   ALT 10  --   --   --   ALKPHOS 160*  --   --   --   BILITOT 0.4  --   --   --   PROT 6.6  --   --   --   ALBUMIN 1.5* 1.2* 1.6* 1.5*   CBC:  Recent Labs Lab 08/23/12 0455 08/24/12 1217 08/25/12 0646 08/26/12 0615 08/27/12 0859  WBC 17.1* 12.3* 17.2* 12.5* 12.7*  HGB 8.7* 9.2* 8.9* 9.4* 8.9*  HCT 28.2* 29.6* 28.9* 30.0* 29.0*  MCV 93.7 92.2 93.2 91.7 92.9  PLT 183 207 234 233 248    BNP (last 3 results)  Recent Labs  04/21/12 1419 05/08/12 0515 06/02/12 1944  PROBNP >70000.0* >70000.0* >70000.0*   CBG:  Recent Labs Lab 08/26/12 2242 08/26/12 2318 08/27/12 0224 08/27/12 0736 08/27/12 1011  GLUCAP 56* 84 79 61* 106*    Recent Results (from the past 240 hour(s))  CULTURE, ROUTINE-ABSCESS     Status: None   Collection Time    08/20/12  2:14 PM      Result Value Range Status   Specimen Description ABSCESS   Final   Special Requests CT GUIDED ASPIRATION OF PANCREATIC PSEUDOCYST   Final   Gram Stain     Final   Value: ABUNDANT WBC PRESENT, PREDOMINANTLY PMN     NO SQUAMOUS EPITHELIAL CELLS SEEN     NO ORGANISMS SEEN   Culture     Final   Value: MODERATE ENTEROBACTER CLOACAE     Note: THIS ISOLATE HAS BEEN CONFIRMED AS A  [facility]PC CARBAPENEMASE PRODUCER CRITICAL RESULT CALLED TO, READ BACK BY AND VERIFIED WITH: AL W 3/13 @930  BY REAMM COLSITIN 0.125ug/mL ETEST results for this drug are "FOR INVESTIGATIONAL USE ONLY" and should NOT be      used for clinical purposes. TIGECYCLINE MIC=4 NO CLSI STANDARDS FOR INTERPRETATION  OF THIS DRUG FOR THIS ORGANISM   Report Status 08/26/2012 FINAL   Final   Organism ID, Bacteria ENTEROBACTER CLOACAE   Final  CULTURE, BLOOD (ROUTINE X 2)     Status: None   Collection Time    08/20/12  9:45 PM      Result Value Range Status   Specimen Description BLOOD LEFT ARM   Final   Special Requests BOTTLES DRAWN AEROBIC AND ANAEROBIC 10CC EACH   Final   Culture  Setup Time 08/21/2012 15:55   Final   Culture     Final   Value:        BLOOD CULTURE RECEIVED NO GROWTH TO DATE CULTURE WILL BE HELD FOR 5 DAYS BEFORE ISSUING A FINAL NEGATIVE REPORT   Report Status PENDING   Incomplete  CULTURE, BLOOD (ROUTINE X 2)     Status: None   Collection Time    08/20/12 10:00 PM      Result Value Range Status   Specimen Description BLOOD LEFT HAND   Final   Special Requests BOTTLES DRAWN AEROBIC AND ANAEROBIC 10CC EACH   Final   Culture   Setup Time 08/21/2012 15:54   Final   Culture  Final   Value:        BLOOD CULTURE RECEIVED NO GROWTH TO DATE CULTURE WILL BE HELD FOR 5 DAYS BEFORE ISSUING A FINAL NEGATIVE REPORT   Report Status PENDING   Incomplete     Studies: US Abdomen Complete  08/25/2012  *RADIOLOGY REPORT*  Clinical Data: Evaluate for ascites.  Abdominal pain.  LIMITED ABDOMINAL ULTRASOUND  Comparison:  CT of the abdomen and pelvis 08/20/2012.  Findings: Limited imaging of the abdomen demonstrated a small amount of ascites scattered throughout the peritoneal cavity.  IMPRESSION: Study is positive for a small volume of ascites.   Original Report Authenticated By: Trudie Reed, M.D.     Scheduled Meds: . aspirin EC  81 mg Oral Daily  . chlorpheniramine-HYDROcodone  5 mL Oral Q12H  . cinacalcet  30 mg Oral Q breakfast  . colistimethate (COLISTIN) IV  1.5 mg/kg/day Intravenous Q24H  . darbepoetin (ARANESP) injection - DIALYSIS  100 mcg Intravenous Q Thu-HD  . docusate sodium  100 mg Oral BID  . feeding supplement (NEPRO CARB STEADY)  237 mL Oral BID WC  . feeding supplement  30 mL Oral q morning - 10a  . ferric gluconate (FERRLECIT/NULECIT) IV  125 mg Intravenous Q T,Th,Sa-HD  . methadone  10 mg Oral QID  . [START ON 08/28/2012] metoprolol tartrate  12.5 mg Oral Daily  . multivitamin  1 tablet Oral QHS  . pantoprazole  40 mg Oral Daily  . sevelamer  400 mg Oral TID WC  . simvastatin  10 mg Oral q1800   Continuous Infusions:    Anastasia Tompson  [facility] Pager [phone].  If 8PM-8AM, please contact night-coverage at [email or web address], password [facility] 08/27/2012, 2:05 PM  LOS: 14 days

## 2012-08-27 NOTE — Procedures (Signed)
I was present at this dialysis session. I have reviewed the session itself and made appropriate changes.   Vinson Moselle, MD [employer] 08/27/2012, 11:20 AM

## 2012-08-27 NOTE — Progress Notes (Signed)
Patient taken to HD.

## 2012-08-27 NOTE — Progress Notes (Signed)
Hypoglycemic Event  CBG: 61  Treatment: 15 GM carbohydrate snack  Symptoms: None  Follow-up CBG: Time: CBG Result:  Possible Reasons for Event: Inadequate meal intake  Comments/MD notified: PT taken to HD, HD RN to follow up with CBG    Melvyn Novas D  Remember to initiate Hypoglycemia Order Set & complete

## 2012-08-28 ENCOUNTER — Inpatient Hospital Stay (HOSPITAL_COMMUNITY): Payer: Medicare Other

## 2012-08-28 DIAGNOSIS — B952 Enterococcus as the cause of diseases classified elsewhere: Secondary | ICD-10-CM

## 2012-08-28 LAB — GLUCOSE, CAPILLARY
Glucose-Capillary: 43 mg/dL — CL (ref 70–99)
Glucose-Capillary: 130 mg/dL — ABNORMAL HIGH (ref 70–99)
Glucose-Capillary: 100 mg/dL — ABNORMAL HIGH (ref 70–99)
Glucose-Capillary: 74 mg/dL (ref 70–99)

## 2012-08-28 MED ORDER — METOPROLOL TARTRATE 25 MG/10 ML ORAL SUSPENSION
6.2000 mg | Freq: Every day | ORAL | Status: DC
  Administered 2012-08-28: 6.2 mg via ORAL
  Filled 2012-08-28 (×2): qty 2.5

## 2012-08-28 MED ORDER — METOPROLOL TARTRATE 12.5 MG HALF TABLET: 6.2500 mg | ORAL_TABLET | Freq: Every day | ORAL | Status: DC

## 2012-08-28 MED ORDER — MAGNESIUM SULFATE 40 MG/ML IJ SOLN
2.0000 g | Freq: Once | INTRAMUSCULAR | Status: AC
  Administered 2012-08-28: 2 g via INTRAVENOUS
  Filled 2012-08-28: qty 50

## 2012-08-28 MED ORDER — NALOXONE HCL 0.4 MG/ML IJ SOLN: 0.2000 mg | INTRAMUSCULAR | Status: DC | PRN

## 2012-08-28 MED ORDER — NEPRO/CARBSTEADY PO LIQD
237.0000 mL | Freq: Three times a day (TID) | ORAL | Status: DC
  Administered 2012-08-28 – 2012-09-03 (×21): 237 mL via ORAL

## 2012-08-28 MED ORDER — NALOXONE HCL 0.4 MG/ML IJ SOLN
INTRAMUSCULAR | Status: AC
  Administered 2012-08-28: 0.2 mg
  Filled 2012-08-28: qty 1

## 2012-08-28 MED ORDER — BISACODYL 10 MG RE SUPP: 10.0000 mg | Freq: Every day | RECTAL | Status: DC | PRN

## 2012-08-28 NOTE — Progress Notes (Signed)
Pt has had multiple hypoglycemic events. MD notified, requesting to change CBG schedule to q4 or achs and 2 am. Will continue to monitor.

## 2012-08-28 NOTE — Progress Notes (Signed)
Called by nurse due to changes in patient mentation; been lethargic confused and unable to follow commands. On arrival patient sleepy/lethargic, but arousable; she was oriented X2 and with some tremors.  Plan: -magnesium given due to episodes of v-tach earlier to guarantee stability; denies CP or SOB -neurontin stopped (new medication started today; had received 600mg  already throughout the day); in case she is experiencing any reaction to it. -0.2 of narcan given; patient on high dose pain meds for chronic pain. -rest of fital signs stable. -night coverage notified for further assessment/evaluation throughout the night.  Chase Knebel [phone]

## 2012-08-28 NOTE — Progress Notes (Signed)
Subjective:  Ate all oatmeal,  hd yesterday with some BP to 80's' Objective Vital signs in last 24 hours: Filed Vitals:   08/27/12 1822 08/27/12 2035 08/28/12 0456 08/28/12 0824  BP: 117/78 110/75 123/82 107/73  Pulse: 78 81 100 95  Temp: 98.4 F (36.9 C) 99.8 F (37.7 C) 99.2 F (37.3 C) 98.4 F (36.9 C)  TempSrc:  Oral Oral Oral  Resp: 15 15 15 16   Height:  5\' 4"  (1.626 m)    Weight:  47.001 kg (103 lb 9.9 oz)    SpO2: 98% 96% 95% 95%   Weight change: -0.544 kg (-1 lb 3.2 oz)  Intake/Output Summary (Last 24 hours) at 08/28/12 0909 Last data filed at 08/28/12 0023  Gross per 24 hour  Intake    410 ml  Output    907 ml  Net   -497 ml   Labs: Basic Metabolic Panel:  Recent Labs Lab 08/23/12 0642 08/25/12 0645 08/26/12 0615 08/27/12 0900  NA 135 134* 135 133*  K 3.3* 4.1 3.7 3.6  CL 98 98 99 97  CO2 30 29 29 27   GLUCOSE 59* 83 78 89  BUN 18 31* 21 35*  CREATININE 1.68* 2.16* 1.74* 2.54*  CALCIUM 9.5 10.7* 10.4 10.5  PHOS 3.1 3.4  --  3.6   Liver Function Tests:  Recent Labs Lab 08/22/12 0655 08/23/12 0642 08/25/12 0645 08/27/12 0900  AST 17  --   --   --   ALT 10  --   --   --   ALKPHOS 160*  --   --   --   BILITOT 0.4  --   --   --   PROT 6.6  --   --   --   ALBUMIN 1.5* 1.2* 1.6* 1.5*   No results found for this basename: LIPASE, AMYLASE,  in the last 168 hours No results found for this basename: AMMONIA,  in the last 168 hours CBC:  Recent Labs Lab 08/23/12 0455 08/24/12 1217 08/25/12 0646 08/26/12 0615 08/27/12 0859  WBC 17.1* 12.3* 17.2* 12.5* 12.7*  HGB 8.7* 9.2* 8.9* 9.4* 8.9*  HCT 28.2* 29.6* 28.9* 30.0* 29.0*  MCV 93.7 92.2 93.2 91.7 92.9  PLT 183 207 234 233 248   Cardiac Enzymes: No results found for this basename: CKTOTAL, CKMB, CKMBINDEX, TROPONINI,  in the last 168 hours CBG:  Recent Labs Lab 08/27/12 1011 08/27/12 1743 08/27/12 2038 08/28/12 0751 08/28/12 0823  GLUCAP 106* 91 95 43* 78    Iron Studies: No results  found for this basename: IRON, TIBC, TRANSFERRIN, FERRITIN,  in the last 72 hours Studies/Results: No results found. Medications:   . aspirin EC  81 mg Oral Daily  . chlorpheniramine-HYDROcodone  5 mL Oral Q12H  . cinacalcet  30 mg Oral Q breakfast  . colistimethate (COLISTIN) IV  1.5 mg/kg/day Intravenous Q24H  . darbepoetin (ARANESP) injection - DIALYSIS  100 mcg Intravenous Q Thu-HD  . docusate sodium  100 mg Oral BID  . feeding supplement (NEPRO CARB STEADY)  237 mL Oral BID WC  . feeding supplement  30 mL Oral q morning - 10a  . ferric gluconate (FERRLECIT/NULECIT) IV  125 mg Intravenous Q T,Th,Sa-HD  . gabapentin  300 mg Oral TID  . methadone  10 mg Oral QID  . metoprolol tartrate  12.5 mg Oral Daily  . multivitamin  1 tablet Oral QHS  . pantoprazole  40 mg Oral Daily  . sevelamer  400 mg Oral TID  WC  . simvastatin  10 mg Oral q1800   Physical Exam:  General: Alert after awoken from sleep , NAD ,thin BF Chronically ill  Heart: RRR, 1/6 sem lsb  Lungs: Decreased BS bilaterally  Abdomen: BS positive, sof, tender Left ;lower and Left upper quad ,no rebound  Extremities: Dialysis Access: Bipedal  pedal edema decreasing  Trace this am / Right ij perm cath Patent on hd   Dialysis Orders: Center: [facility] on TTS.  EDW 53.5 kg HD Bath 3K/2.5Ca Time 4hrs Heparin 1600 U. Access Right IJ catheter BFR 400 DFR 800 Hectorol 0 mcg IV/HD Epogen 1500 Units IV/HD Venofer 0   Assessment/Plan:  1. 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 Luciana Axe noted  NEED for  2 weeks total   Colistin q day  IV then CT scan repeat , Ms Demonbreun  agreed to this with Palliative care meeting yesterday , Thus needing IV access Probable Pic line and probable LTAC placement. If patient needs outpatient IV abx, a L arm midline PICC would probably be the best choice. She has an ICD/pacemaker on that side and recently had a failed attempt at permanent access on that side (AVF  ligated due to edema). She is supposed to have a R arm perm access placed at some time in the future when she is stable. However, if she is going to an LTAC and can get by with peripheral IV access for abx, that would be preferable. Discussed with Dr Gwenlyn Perking 2. ESRD - TTS HD; her hyponatremia has improve with hd And volume removal with hd. bp dropped to sbp 80's yesterday she was asymptomatic /  900 cc uf with pedal edema improved   3. Anemia - Hgb 8.9> 9.4 s/p 2 units on 3/2; . Aranesp 100. Tsat 22% and Ferritin 1513 on 2/20. IV fe. 4.  Secondary hyperparathyroidism / hypercalcemia - no hectorol; 2.25Ca bath. Unable to use 2.0 sec  low K/ on  Renvela with phos controlled on regular diet sec.  low alb. and.-  sensipar 60 mg q day increased 3/14 for hypercalcemia  5.  HTN/volume/ascities - BP  107/73 this am /asymp  Drop on hd as above yesterday and using albumin  For bp support on hd/ wt to 47kg test post hd  And pedal edema improved this am/  on metoprolol 12.5 bid.  On her ho severe CM/ chf will decrease to 6.25mg  bid and hold for sbp < 110. 6.  .Nutrition - Albumin 1.2.> 1.6 (Has PC) . Reg diet With fluid restriction, Protein( Nepro) supplements dw her about eating  smaller meals through out day  At least 4 x /day 7. .Chronic systolic CHF, NYHA class 3 - per primary also severe MR from last echo done in Mar '14  8.  DNR- Palliative care meeting yesterday for Goals of Care as noted in 1.  Need for LTAC  With q day antibiotics.  Lenny Pastel, PA-C [facility] Beeper [phone] 08/28/2012,9:09 AM  LOS: 15 days   Patient seen and examined.  I agree with plan as above with additions as indicated. Vinson Moselle  MD [phone] pgr    [phone] cell 08/28/2012, 10:12 AM

## 2012-08-28 NOTE — Progress Notes (Signed)
Hypoglycemic Event  CBG: 43  Treatment: 15 GM carbohydrate snack and 15 GM gel  Symptoms: Pale, Sweaty and Shaky  Follow-up CBG: [id number] CBG Result:78  Possible Reasons for Event: Inadequate meal intake  Comments/MD notified:yes    [name] D  Remember to initiate Hypoglycemia Order Set & complete

## 2012-08-28 NOTE — Progress Notes (Signed)
Came in to check on pt, pt sleeping in bed, had not eaten dinner. Attempted to wake pt for dinner, pt appears very drowsy, unable to hold conversation. Family in room state that pt is not normally like this. Pt with increased shakiness, difficulty responding to questions, unable to hold herself up when trying to sit on side of bed. CBG 100. MD notified, came to bedside and assessed pt. 0.2 of narcan given IV, pt unchanged. MD to d/c gabapentin at this time, hold off on sedating medications, frequent neuro checks. Will pass on to oncoming shift.

## 2012-08-28 NOTE — Progress Notes (Signed)
Pt with 10 beat run of VTach, ICD converted. Pt sleeping, asymptomatic. MD notified, copy of strip placed in chart. Will continue to monitor.

## 2012-08-28 NOTE — Progress Notes (Signed)
[facility] progress note. Chief complaint. Altered mental status. History of present illness. This 62 year old female in hospital with leukocytosis and fever thought secondary to abdominal abscess. She is on fairly high-dose narcotics for chronic pain and was recently started on Neurontin. Patient was noted with altered mental status earlier in the day and Dr. Gwenlyn Perking ordered Narcan and discontinuance of the recently started Neurontin. I'm seeing the patient at bedside to followup on her mental status. I find her alert and conversational no orally oriented x2 which is a change from her prior fully oriented baseline. Vital signs. Temperature 99.9, pulse 100, respiration 18, blood pressure 119/82. O2 sats 94%. General appearance. Frail appearing elderly female who is alert, cooperative, and in no distress. Cardiac. Rate and rhythm regular. 2/6 systolic ejection murmur. Lungs. Diminished in the bases but clear. Abdomen. Soft with positive bowel sounds. There is some diffuse pain with palpation. Neurologic. Cranial nerves 2-12 grossly intact. No unilateral or focal defects. Patient is only oriented x2. Impression/plan. Problem #1. Altered mental status. I suspect a current altered mental status do to any medication Neurontin which has been discontinued. Her grip strengths are a moderate but equal. No unilateral or focal defects. Given the acute change in her mental status I will go ahead and proceed with a stat CT scan of the head without contrast to rule out any acute process.

## 2012-08-28 NOTE — Progress Notes (Signed)
[facility] PROGRESS NOTE Assessment/Plan: *Worsening leukocytosis and fevers due to abdominal abscess: - meropenem d/c as microorganism isolated is resistant to it. Patient afebrile. - ID consulted for antibiotics management. 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 -leukocytosis improving now 12.7 -patient stable; still with some abdominal pain but better overall. -will discuss with CM/SW discharge options.   Chronic systolic congestive heart failure, NYHA class 3 - no SOB, no further coughing spells'; Mild fluid overloaded. fine crackles, but no JVD. - Continue metoprolol but change to once a day given soft BP.  -volume to be manage with HD treatment.    Hyponatremia - resolved with HD    ESRD on hemodialysis - Continue HD T/T/S    Anemia in chronic kidney disease (CKD) - Hemoglobin 7.2 on admission. The patient has received 2 units of PRBCs 08/14/2012.  - Hemoglobin remains stable. Will cont monitoring -no acute signs of bleeding    Pancreatitis, chronic - stable; lipase WNL -pseudocyst/abscess as mentioned above.    Hypertension - BP soft, especially after HD; but overall stable.. -patient asymptomatic with low BP  -continue metoprolol once a day  Code Status: DNR  Family Communication: no family at bedside  Disposition Plan: to be detrmine; patient wants to go home; per PT she will benefit of SNF and if treatment with daily IV antibiotics is pursuit she might qualify for LTAC.    Consultants:  Nephrology  ID  Procedures: Echo 3.5.2014: The estimated ejection fraction was in the range of 20% to 25%, Peak  RV-RA gradient: 41mm Hg (S). percutaneous drain by  IR 3.8.2014.   Antibiotics:  vanc zosyn 3.4.2014- 3.5.2014  Meropenem: 3/12-3/13   colestin 08/25/12  HPI/Subjective Patient afebrile, and denies any nausea/vomiting. Appetite still poor; experiencing hypoglycemic events (especially at night)  Objective: Filed Vitals:   08/27/12 2035 08/28/12 0456 08/28/12 0824 08/28/12 1320  BP: 110/75 123/82 107/73 97/63  Pulse: 81 100 95 85  Temp: 99.8 F (37.7 C) 99.2 F (37.3 C) 98.4 F (36.9 C) 98.6 F (37 C)  TempSrc: Oral Oral Oral Oral  Resp: 15 15 16 16   Height: 5\' 4"  (1.626 m)     Weight: 47.001 kg (103 lb 9.9 oz)     SpO2: 96% 95% 95% 96%    Intake/Output Summary (Last 24 hours) at 08/28/12 1510 Last data filed at 08/28/12 0023  Gross per 24 hour  Intake    210 ml  Output      0 ml  Net    210 ml   Filed Weights   08/27/12 0757 08/27/12 1211 08/27/12 2035  Weight: 47.9 kg (105 lb 9.6 oz) 47 kg (103 lb 9.9 oz) 47.001 kg (103 lb 9.9 oz)    Exam:  General: Alert, awake, oriented x3, in no acute distress.  HEENT: No bruits, no goiter.  Heart: Regular rate and rhythm, without murmurs, rubs, gallops.  Lungs: Good air movement, mild crackles bilaterally at bases. Abdomen: Soft, slight distended; Mild rebound to deep palpation. Still with left side pain EXT: 1+ edema bilaterally and improving after each HD   Data Reviewed: Basic Metabolic Panel:  Recent Labs Lab 08/22/12 [phone]  08/23/12 0642 08/25/12 0645 08/26/12 0615 08/27/12 0900  NA 130* 135 134* 135 133*  K 4.3 3.3* 4.1 3.7 3.6  CL 94* 98 98 99 97  CO2 27 30 29 29 27   GLUCOSE 99 59* 83 78 89  BUN 31* 18 31* 21 35*  CREATININE 2.37* 1.68* 2.16* 1.74* 2.54*  CALCIUM 11.0* 9.5 10.7* 10.4 10.5  MG  --   --   --  2.2  --   PHOS  --  3.1 3.4  --  3.6   Liver Function Tests:  Recent Labs Lab 08/22/12 0655 08/23/12 0642 08/25/12 0645 08/27/12 0900  AST 17  --   --   --   ALT 10  --   --   --    ALKPHOS 160*  --   --   --   BILITOT 0.4  --   --   --   PROT 6.6  --   --   --   ALBUMIN 1.5* 1.2* 1.6* 1.5*   CBC:  Recent Labs Lab 08/23/12 0455 08/24/12 1217 08/25/12 0646 08/26/12 0615 08/27/12 0859  WBC 17.1* 12.3* 17.2* 12.5* 12.7*  HGB 8.7* 9.2* 8.9* 9.4* 8.9*  HCT 28.2* 29.6* 28.9* 30.0* 29.0*  MCV 93.7 92.2 93.2 91.7 92.9  PLT 183 207 234 233 248   BNP (last 3 results)  Recent Labs  04/21/12 1419 05/08/12 0515 06/02/12 1944  PROBNP >70000.0* >70000.0* >70000.0*   CBG:  Recent Labs Lab 08/27/12 2038 08/28/12 0751 08/28/12 0823 08/28/12 0931 08/28/12 1156  GLUCAP 95 43* 78 127* 158*    Recent Results (from the past 240 hour(s))  CULTURE, ROUTINE-ABSCESS     Status: None   Collection Time    08/20/12  2:14 PM      Result Value Range Status   Specimen Description ABSCESS   Final   Special Requests CT GUIDED ASPIRATION OF PANCREATIC PSEUDOCYST   Final   Gram Stain     Final   Value: ABUNDANT WBC PRESENT, PREDOMINANTLY PMN     NO SQUAMOUS EPITHELIAL CELLS SEEN     NO ORGANISMS SEEN   Culture     Final   Value: MODERATE ENTEROBACTER CLOACAE     Note: THIS ISOLATE HAS BEEN CONFIRMED AS A  [facility] CARBAPENEMASE PRODUCER CRITICAL RESULT CALLED TO, READ BACK BY AND VERIFIED WITH: AL W 3/13 @930  BY REAMM COLSITIN 0.125ug/mL ETEST results for this drug are "FOR INVESTIGATIONAL USE ONLY" and should NOT be      used for clinical purposes. TIGECYCLINE MIC=4 NO [facility]SI STANDARDS FOR INTERPRETATION  OF THIS DRUG FOR THIS ORGANISM   Report Status 08/26/2012 FINAL   Final   Organism ID, Bacteria ENTEROBACTER CLOACAE   Final  CULTURE, BLOOD (ROUTINE X 2)     Status: None   Collection Time    08/20/12  9:45 PM      Result Value Range Status   Specimen Description BLOOD LEFT ARM   Final   Special Requests BOTTLES DRAWN AEROBIC AND ANAEROBIC 10CC EACH   Final   Culture  Setup Time 08/21/2012 15:55   Final   Culture     Final   Value:        BLOOD CULTURE RECEIVED NO  GROWTH TO DATE CULTURE WILL BE HELD FOR 5 DAYS BEFORE ISSUING A FINAL NEGATIVE REPORT   Report Status PENDING   Incomplete  CULTURE, BLOOD (ROUTINE X 2)     Status: None   Collection Time  08/20/12 10:00 PM      Result Value Range Status   Specimen Description BLOOD LEFT HAND   Final   Special Requests BOTTLES DRAWN AEROBIC AND ANAEROBIC 10CC EACH   Final   Culture  Setup Time 08/21/2012 15:54   Final   Culture     Final   Value:        BLOOD CULTURE RECEIVED NO GROWTH TO DATE CULTURE WILL BE HELD FOR 5 DAYS BEFORE ISSUING A FINAL NEGATIVE REPORT   Report Status PENDING   Incomplete     Studies: No results found.  Scheduled Meds: . aspirin EC  81 mg Oral Daily  . chlorpheniramine-HYDROcodone  5 mL Oral Q12H  . cinacalcet  30 mg Oral Q breakfast  . colistimethate (COLISTIN) IV  1.5 mg/kg/day Intravenous Q24H  . darbepoetin (ARANESP) injection - DIALYSIS  100 mcg Intravenous Q Thu-HD  . docusate sodium  100 mg Oral BID  . feeding supplement (NEPRO CARB STEADY)  237 mL Oral TID WC & HS  . feeding supplement  30 mL Oral q morning - 10a  . ferric gluconate (FERRLECIT/NULECIT) IV  125 mg Intravenous Q T,Th,Sa-HD  . gabapentin  300 mg Oral TID  . methadone  10 mg Oral QID  . metoprolol tartrate  6.2 mg Oral QHS  . multivitamin  1 tablet Oral QHS  . pantoprazole  40 mg Oral Daily  . sevelamer  400 mg Oral TID WC  . simvastatin  10 mg Oral q1800   Continuous Infusions:    [name]  [facility] Pager [phone].  If 8PM-8AM, please contact night-coverage at [email or web address], password [facility] 08/28/2012, 3:10 PM  LOS: 15 days

## 2012-08-28 NOTE — Consult Note (Signed)
Patient ZO:XWRUEAV SERAYA JOBST      DOB: 1950-09-08      WUJ:[id number]     Consult Note from the Palliative Medicine Team at [facility]    Consult Requested by: Vassie Loll     PCP: Jyl Heinz, MD Reason for Consultation:Goals of care     Phone Number:[phone]  Assessment of patients Current state: 62 year old female, with chronic medical problems ncluding CAD, status post CABG,  Status post AICD, CHF with EF of 10%, liver cirrhosis, ESRD on hemodialysis,  Chronic pancreatitis, with abdominal abscesses status post drainage of the abscesses, who is now admitted with infected pseudocyst requiring 2 weeks of antibiotics. Patient initially refused to go to skilled nursing facility, and wanted to be discharged home. Goals of care meeting done with the patient, her granddaughter in the room and her son and daughter on phone in conference call, after discussion with the family patient agrees with antibiotics for 2 weeks to be given at skilled nursing facility. Patient also agrees to get further imaging studies as needed for follow up of the infected pseudocyst. She agrees to be compliant with the recommendations made by the medical team.  Goals of Care: 1.  Code Status: DNR   2. Scope of Treatment: 1. Vital Signs: per protocol 2. AICD: Continue AICD  3. Respiratory/Oxygen:As needed via nasal canula 4. Antibiotics: Continue antibiotics as planned by ID 5. Blood Products: Continue with blood transfusion as needed 6. IVF: Continue IVF as needed 7. Review of Medications to be discontinued: None 8. Labs: Continue as needed    4. Disposition: SNF vs LTAC for two weeks of antibiotics   3. Symptom Management:    1. Pain: Patient has abdominal pain at the site of  Needle aspiration, she would benefit from antiinflammatory medications like NSAIDS, patient is scared of taking NSAIDs at this time due to h/o gastric ulcer and Liver disease. I have added  Neurontin  300 mg po TID as adjuvant  medication to methadone she has been taking. 2. Bowel Regimen:Colace , start Dulcolax suppository prn 3. Fever: Tylenol prn 4. Nausea/Vomiting: Zofran prn   4. Psychosocial: Patient lives at home by herself, and she does not want to go to the SNF as she had bad experience at [facility]. After the discussion with family she now agrees to go to SNF for antibiotics.     Patient Documents Completed or Given: Document Given Completed  Advanced Directives Pkt    MOST    DNR    Gone from My Sight    Hard Choices      Brief HPI:* 62 year old female with multiple medical problems including CAD, status post CABG,  Status post AICD, CHF with EF of 10%, liver cirrhosis, ESRD on hemodialysis,  Chronic pancreatitis, with abdominal abscesses status post drainage of the abscesses. Patient still has abdominal abscess from infected pseudocyst and currently require 2 weeks of antibiotics. Patient did not want to go to a nursing home and wanted to discharge home. So goals of care meeting was done with the patient, her granddaughter in the room and her son and daughter on the phone with conference call. Discussed the patient's chronic medical problems, need for antibiotics, confirmed patient's goals of care, discussed patient's compliance issues.  ROS: Pain; patient complains of Constant pain especially at the site of needle aspiration in the left lower quadrant of the abdomen     PMH:  Past Medical History  Diagnosis Date  . Coronary artery disease  s/p CABG 2008 with multiple PCIs  . Ischemic cardiomyopathy     Cath 04/2012, significant calcifications  . History of nonadherence to medical treatment   . Systolic CHF     16/1096 echo EF 10%  . Esophageal varices   . Cirrhosis     hx of fatty liver per patient, has known ascites with repeated paracenteses at [facility] as of 2013, also +hx of esoph varices with banding in the past  . Pacemaker   . ICD (implantable cardiac defibrillator) in place   .  Hypertension     "used to have HTN; now I'm low" (05/23/2012)  . NSTEMI (non-ST elevated myocardial infarction)     04/2012; Trop peaked to 1.39; 05/23/2012 pt denies ever having MI  . Pneumonia 02/2012    "first time ever" (05/23/2012)  . Chronic bronchitis 1970's thru 2002    "went away when I stopped smoking in 2002" (05/23/2012)  . Diabetes mellitus     Diet controlled  . History of blood transfusion     "alot of them" (05/23/2012)  . Iron (Fe) deficiency anemia     "severe" (05/23/2012)  . Pancreatitis, chronic 10/04/2011    Had severe gallstone pancreatitis in May 2003, no surgery, treated medically then returned in June 2003 for cholecystectomy and cyst gastrostomy for pancreatic pseudocyst. In March 2013 was admitted for hemorrhage into psueodcyst. In April 2013 was treated at [facility] for polymicrobial bacteremia (fungal, MRSA and enterobacter) felt to be due to ruptured pseudocyst, treated medically. Admitted Jan 2014 with abd pain and had gas and fluid in pancreatic bed. She refused surgery ("would not make it") and had fluid aspirated by IR- gram stain showed yeast and cultures grew enterococcus species, sensitive to amp and vanc. She was seen by ID and discharged on IV vanc/fortaz with HD and po voriconazole.     [name] ESRD on hemodialysis 05/06/2012    T,Th,Sat HD by [facility] on [address]. Started hemodialysis around July 2013.    [name] CHF (congestive heart failure)   . Complication of vascular access for dialysis 07/13/2012    L arm AVG (placed by Dr. Lyda Perone @ [facility]/25) was ligated 1/6 after significant L arm swelling secondary to L SCV stenosis and pacemaker; new access to be placed at right arm on 1/29 by Dr. Hollace Hayward       PSH: Past Surgical History  Procedure Laterality Date  . Esophagogastroduodenoscopy  09/15/2011    Procedure: ESOPHAGOGASTRODUODENOSCOPY (EGD);  Surgeon: Theda Belfast, MD;  Location: [facility] ENDOSCOPY;  Service: Endoscopy;  Laterality: N/A;  .  Cholecystectomy  2003  . Tonsillectomy and adenoidectomy  1961  . Appendectomy  1973?  [name] Vaginal hysterectomy  1973?  [name] Tubal ligation  1972  . Dilation and curettage of uterus      "bunch from profuse bleeding in the 1970's" (05/23/2012)  . Coronary artery bypass graft  2008    CABG X4  . Coronary angioplasty with stent placement  2008    "1; day after CABG" (05/23/2012)  . Cardiac catheterization      "before 2008 and 3 wk ago" (05/23/2012)  . Insert / replace / remove pacemaker  06/2011    pacemaker ICD  . Cardiac defibrillator placement  06/2011  . Arteriovenous graft placement  03/2012    right antecub  . Insertion of dialysis catheter  ~ 10/2011    right chest  . Peritoneal catheter insertion  08/2011  . Peritoneal catheter removal  ~ 10/2011  I have reviewed the FH and SH and  If appropriate update it with new information. Allergies  Allergen Reactions  . Codeine Itching  . Morphine And Related Shortness Of Breath    SOB when given IV once "to me it was mild; I called out fast for help; never had to intubate" (05/23/2012)  . Ace Inhibitors Other (See Comments)    Unknown reaction but her physician stated that she can't take it (specifically lisinopril)  . Tylenol (Acetaminophen) Other (See Comments)    Patient states that the doctor told her she has a Fatty Liver.   Scheduled Meds: . aspirin EC  81 mg Oral Daily  . chlorpheniramine-HYDROcodone  5 mL Oral Q12H  . cinacalcet  30 mg Oral Q breakfast  . colistimethate (COLISTIN) IV  1.5 mg/kg/day Intravenous Q24H  . darbepoetin (ARANESP) injection - DIALYSIS  100 mcg Intravenous Q Thu-HD  . docusate sodium  100 mg Oral BID  . feeding supplement (NEPRO CARB STEADY)  237 mL Oral BID WC  . feeding supplement  30 mL Oral q morning - 10a  . ferric gluconate (FERRLECIT/NULECIT) IV  125 mg Intravenous Q T,Th,Sa-HD  . gabapentin  300 mg Oral TID  . methadone  10 mg Oral QID  . metoprolol tartrate  12.5 mg Oral Daily  . multivitamin   1 tablet Oral QHS  . pantoprazole  40 mg Oral Daily  . sevelamer  400 mg Oral TID WC  . simvastatin  10 mg Oral q1800   Continuous Infusions:  PRN Meds:.dextrose, guaiFENesin, HYDROmorphone (DILAUDID) injection, HYDROmorphone, menthol-cetylpyridinium, ondansetron (ZOFRAN) IV, ondansetron    BP 123/82  Pulse 100  Temp(Src) 99.2 F (37.3 C) (Oral)  Resp 15  Ht 5\' 4"  (1.626 m)  Wt 47.001 kg (103 lb 9.9 oz)  BMI 17.78 kg/m2  SpO2 95%      Intake/Output Summary (Last 24 hours) at 08/28/12 1610 Last data filed at 08/28/12 0023  Gross per 24 hour  Intake    410 ml  Output    907 ml  Net   -497 ml     Physical Exam:  General: Appear in  no acute distress  HEENT: NCAT Chest:   Clear bilaterally CVS: s1s2 RRR Abdomen:Soft, nontender Ext: No edema Neuro:A O x 3, patient has decision making capacity  Labs: CBC    Component Value Date/Time   WBC 12.7* 08/27/2012 0859   WBC 6.7 04/08/2011 1054   RBC 3.12* 08/27/2012 0859   RBC 3.40* 04/08/2011 1054   HGB 8.9* 08/27/2012 0859   HGB 10.1* 04/08/2011 1054   HCT 29.0* 08/27/2012 0859   HCT 30.1* 04/08/2011 1054   PLT 248 08/27/2012 0859   PLT 232 04/08/2011 1054   MCV 92.9 08/27/2012 0859   MCV 88.6 04/08/2011 1054   MCH 28.5 08/27/2012 0859   MCH 29.7 04/08/2011 1054   MCHC 30.7 08/27/2012 0859   MCHC 33.5 04/08/2011 1054   RDW 17.1* 08/27/2012 0859   RDW 20.4* 04/08/2011 1054   LYMPHSABS 1.3 08/13/2012 2142   LYMPHSABS 1.4 04/08/2011 1054   MONOABS 1.4* 08/13/2012 2142   MONOABS 0.4 04/08/2011 1054   EOSABS 0.1 08/13/2012 2142   EOSABS 0.2 04/08/2011 1054   BASOSABS 0.1 08/13/2012 2142   BASOSABS 0.0 04/08/2011 1054    BMET    Component Value Date/Time   NA 133* 08/27/2012 0900   K 3.6 08/27/2012 0900   CL 97 08/27/2012 0900   CO2 27 08/27/2012 0900   GLUCOSE 89  08/27/2012 0900   BUN 35* 08/27/2012 0900   CREATININE 2.54* 08/27/2012 0900   CALCIUM 10.5 08/27/2012 0900   GFRNONAA 19* 08/27/2012 0900   GFRAA 22* 08/27/2012  0900    CMP     Component Value Date/Time   NA 133* 08/27/2012 0900   K 3.6 08/27/2012 0900   CL 97 08/27/2012 0900   CO2 27 08/27/2012 0900   GLUCOSE 89 08/27/2012 0900   BUN 35* 08/27/2012 0900   CREATININE 2.54* 08/27/2012 0900   CALCIUM 10.5 08/27/2012 0900   PROT 6.6 08/22/2012 0655   ALBUMIN 1.5* 08/27/2012 0900   AST 17 08/22/2012 0655   ALT 10 08/22/2012 0655   ALKPHOS 160* 08/22/2012 0655   BILITOT 0.4 08/22/2012 0655   GFRNONAA 19* 08/27/2012 0900   GFRAA 22* 08/27/2012 0900        Time In Time Out Total Time Spent with Patient Total Overall Time  4 pm 5 pm 45 min 75 min    Greater than 50%  of this time was spent counseling and coordinating care related to the above assessment and plan.

## 2012-08-29 ENCOUNTER — Encounter (HOSPITAL_COMMUNITY): Payer: Self-pay | Admitting: Radiology

## 2012-08-29 ENCOUNTER — Inpatient Hospital Stay (HOSPITAL_COMMUNITY): Payer: Medicare Other

## 2012-08-29 DIAGNOSIS — E43 Unspecified severe protein-calorie malnutrition: Secondary | ICD-10-CM

## 2012-08-29 DIAGNOSIS — R630 Anorexia: Secondary | ICD-10-CM | POA: Diagnosis present

## 2012-08-29 DIAGNOSIS — R1012 Left upper quadrant pain: Secondary | ICD-10-CM

## 2012-08-29 LAB — GLUCOSE, CAPILLARY
Glucose-Capillary: 108 mg/dL — ABNORMAL HIGH (ref 70–99)
Glucose-Capillary: 110 mg/dL — ABNORMAL HIGH (ref 70–99)
Glucose-Capillary: 118 mg/dL — ABNORMAL HIGH (ref 70–99)
Glucose-Capillary: 49 mg/dL — ABNORMAL LOW (ref 70–99)
Glucose-Capillary: 84 mg/dL (ref 70–99)
Glucose-Capillary: 85 mg/dL (ref 70–99)

## 2012-08-29 LAB — CULTURE, BLOOD (ROUTINE X 2): Culture: NO GROWTH

## 2012-08-29 MED ORDER — METOPROLOL TARTRATE 25 MG/10 ML ORAL SUSPENSION
6.2000 mg | Freq: Every day | ORAL | Status: DC
  Administered 2012-08-29 – 2012-09-01 (×4): 6.2 mg via ORAL
  Filled 2012-08-29 (×6): qty 2.5

## 2012-08-29 MED ORDER — CINACALCET HCL 30 MG PO TABS
60.0000 mg | ORAL_TABLET | Freq: Every day | ORAL | Status: DC
  Administered 2012-08-29 – 2012-09-03 (×6): 60 mg via ORAL
  Filled 2012-08-29 (×6): qty 2

## 2012-08-29 MED ORDER — DEXTROSE 50 % IV SOLN
INTRAVENOUS | Status: AC
  Administered 2012-08-29: 50 mL
  Filled 2012-08-29: qty 50

## 2012-08-29 MED ORDER — SODIUM CHLORIDE 0.9 % IJ SOLN
10.0000 mL | Freq: Two times a day (BID) | INTRAMUSCULAR | Status: DC
  Administered 2012-08-30 – 2012-08-31 (×2): 10 mL

## 2012-08-29 MED ORDER — IOHEXOL 300 MG/ML  SOLN: 50.0000 mL | Freq: Once | INTRAMUSCULAR | Status: AC | PRN

## 2012-08-29 MED ORDER — MELOXICAM 7.5 MG PO TABS
7.5000 mg | ORAL_TABLET | Freq: Every day | ORAL | Status: DC
  Administered 2012-08-30 – 2012-08-31 (×2): 7.5 mg via ORAL
  Filled 2012-08-29 (×3): qty 1

## 2012-08-29 MED ORDER — METOPROLOL TARTRATE 25 MG/10 ML ORAL SUSPENSION: 6.2000 mg | Freq: Two times a day (BID) | ORAL | Status: DC

## 2012-08-29 MED ORDER — PANTOPRAZOLE SODIUM 40 MG PO TBEC
40.0000 mg | DELAYED_RELEASE_TABLET | Freq: Every day | ORAL | Status: DC
  Administered 2012-08-30 – 2012-09-03 (×5): 40 mg via ORAL
  Filled 2012-08-29 (×4): qty 1

## 2012-08-29 MED ORDER — SODIUM CHLORIDE 0.9 % IJ SOLN
10.0000 mL | INTRAMUSCULAR | Status: DC | PRN
  Administered 2012-08-29: 20 mL
  Administered 2012-08-30 – 2012-09-02 (×6): 10 mL
  Administered 2012-09-03: 20 mL
  Administered 2012-09-03: 10 mL

## 2012-08-29 NOTE — Progress Notes (Signed)
PT Cancellation Note  Patient Details Name: IARA MONDS MRN: [id number] DOB: [birth year]   Cancelled Treatment:    Reason Eval/Treat Not Completed: Patient at procedure or test/unavailable   Donnetta Hail 08/29/2012, 1:46 PM

## 2012-08-29 NOTE — Progress Notes (Signed)
[facility] Progress Note  Subjective:   Still has lower abdominal pain. Feels confused. Having difficulty expressing thoughts.  Objective Filed Vitals:   08/28/12 1320 08/28/12 1732 08/28/12 2117 08/29/12 0423  BP: 97/63 119/82 127/90 115/76  Pulse: 85 100 108 104  Temp: 98.6 F (37 C) 99.9 F (37.7 C) 98.8 F (37.1 C) 98.1 F (36.7 C)  TempSrc: Oral Oral Oral Oral  Resp: 16 18 18 18   Height:   5\' 4"  (1.626 m)   Weight:   47.001 kg (103 lb 9.9 oz)   SpO2: 96% 94% 95% 95%   Physical Exam General: oriented to [facility], president, 2014, supine in bed Heart: RRR, tachy ~100 Lungs: decreased BS Abdomen: soft, tender LL and LUQ Extremities: 1+ LE edema below calf bilaterally Dialysis Access: right I-J Neuro: speech slower, almost thick difficulty; not as alert and talkative as usual; told me she can answer questions, but has difficulty expression thoughts  Dialysis Orders: Center: [facility] on TTS.  EDW 53.5 kg HD Bath 3K/2.5Ca Time 4hrs Heparin 1600 U. Access Right IJ catheter BFR 400 DFR 800 Hectorol 0 mcg IV/HD Epogen 1500 Units IV/HD Venofer 0  Assessment/Plan: 1. Infected pseudocyst - [facility] - on 2 week course of colistin started then to have repeat CT; IR to place PICC today. However, she needs a peripheral access not central due to pacemaker on left and right I-J; left arm PICC preferable per Dr. Malena Catholic note 3/16. 2. ESRD - TTS - hyponatremia improved with dialysis and lowering of EDW via serial HD; plan HD Tuesday 3. Anemia - Hgb 8.9>9.4>8.9 variable, but overall stable s/p 2 units PRBC 3/02. Aranesp 100; repleteing iron  4. Secondary hyperparathyroidism - hypercalcemia - can't use 2 Ca bath due to low K, using 2.25; sensipar started last week, but will increase to 60 5. HTN/volume - BP stable - metoprolol decreased to 6.25 q HS; EDW down 6 kg since admission 6. Protein calorie malnutrition - regular diet with protein supplement and nepro- NPO this am for  PICC 7. Chronic systolic CHF, NYHA class 3 - per primary also severe MR from last echo done in Mar '14  8. Mental status changes - Head CT neg; neurontin stopped; afebrile today; tmax 99.9  8. DNR -Palliative care following; planning LTAC for q day antibioitcs Sheffield Slider, PA-C [facility] Beeper [phone] 08/29/2012,8:02 AM  LOS: 16 days    Additional Objective Labs: Basic Metabolic Panel:  Recent Labs Lab 08/23/12 0642 08/25/12 0645 08/26/12 0615 08/27/12 0900  NA 135 134* 135 133*  K 3.3* 4.1 3.7 3.6  CL 98 98 99 97  CO2 30 29 29 27   GLUCOSE 59* 83 78 89  BUN 18 31* 21 35*  CREATININE 1.68* 2.16* 1.74* 2.54*  CALCIUM 9.5 10.7* 10.4 10.5  PHOS 3.1 3.4  --  3.6   Liver Function Tests:  Recent Labs Lab 08/23/12 0642 08/25/12 0645 08/27/12 0900  ALBUMIN 1.2* 1.6* 1.5*   CBC:  Recent Labs Lab 08/23/12 0455 08/24/12 1217 08/25/12 0646 08/26/12 0615 08/27/12 0859  WBC 17.1* 12.3* 17.2* 12.5* 12.7*  HGB 8.7* 9.2* 8.9* 9.4* 8.9*  HCT 28.2* 29.6* 28.9* 30.0* 29.0*  MCV 93.7 92.2 93.2 91.7 92.9  PLT 183 207 234 233 248   Blood Culture    Component Value Date/Time   SDES BLOOD LEFT HAND 08/20/2012 2200   SPECREQUEST BOTTLES DRAWN AEROBIC AND ANAEROBIC 10CC EACH 08/20/2012 2200   CULT  BLOOD CULTURE RECEIVED NO GROWTH TO DATE CULTURE WILL BE HELD FOR 5 DAYS BEFORE ISSUING A FINAL NEGATIVE REPORT 08/20/2012 2200   REPTSTATUS PENDING 08/20/2012 2200   CBG:  Recent Labs Lab 08/28/12 1851 08/28/12 2115 08/29/12 0003 08/29/12 0349 08/29/12 0735  GLUCAP 100* 74 108* 77 49*  Studies/Results: Ct Head Wo Contrast  08/28/2012  *RADIOLOGY REPORT*  Clinical Data: Acute mental status changes.  Patient unresponsive.  CT HEAD WITHOUT CONTRAST  T IMPRESSION:  1.  No acute intracranial abnormality. 2.  Stable mild cortical atrophy and mild chronic microvascular ischemic changes of the white matter.   Original Report Authenticated By: Hulan Saas,  M.D.    Medications:   . aspirin EC  81 mg Oral Daily  . chlorpheniramine-HYDROcodone  5 mL Oral Q12H  . cinacalcet  30 mg Oral Q breakfast  . colistimethate (COLISTIN) IV  1.5 mg/kg/day Intravenous Q24H  . darbepoetin (ARANESP) injection - DIALYSIS  100 mcg Intravenous Q Thu-HD  . dextrose      . docusate sodium  100 mg Oral BID  . feeding supplement (NEPRO CARB STEADY)  237 mL Oral TID WC & HS  . feeding supplement  30 mL Oral q morning - 10a  . ferric gluconate (FERRLECIT/NULECIT) IV  125 mg Intravenous Q T,Th,Sa-HD  . methadone  10 mg Oral QID  . metoprolol tartrate  6.2 mg Oral QHS  . multivitamin  1 tablet Oral QHS  . pantoprazole  40 mg Oral Daily  . sevelamer  400 mg Oral TID WC  . simvastatin  10 mg Oral q1800   Attending Note: Confused this morning and some diaphoresis.  Some low BSs today to 49mg /dl. Low grade temp. On deck for HD in AM and hopeful for IV access for outpt antibiotics. See IR note. [facility] [place] C

## 2012-08-29 NOTE — Progress Notes (Addendum)
NUTRITION FOLLOW UP  DOCUMENTATION CODES  Per approved criteria   -Severe malnutrition in the context of chronic illness    Intervention:   1. Agree with liberalized diet. Continue to encourage Nepro Shakes and prostat. 2. Pt is refusing meals and confused - if continues, strongly recommend initiation of enteral nutrition given ongoing severe malnutrition. 3. RD to continue to follow nutrition care plan  Nutrition Dx:   Increased nutrient needs related to HD as evidenced by estimated needs. Ongoing.  Goal:   Pt to meet >/= 90% of their estimated nutrition needs.  Monitor:   weight trends, lab trends, I/O's, PO intake, supplement tolerance  Assessment:   Admitted with worsening SOB, work-up reveals HCAP. Pt also noted to have missed HD session PTA.  CT scan of abdomen and pelvis showed 2 new fluid and gas collection along the left lateral abdominal wall and the body of the pancreas. Surgery consulted, however pt is very high risk for any sort of surgical intervention and she is aware of this and essentially refuses any surgery. Recommendations include percutaneous drainage.  Underwent CT-guided aspiration or pseudocyst 3/8. Now with worsening leukocytosis, but better abdominal pain.   Per chart, no role for paracentesis at this time. Underwent GOC meeting on 3/16, d/c plan for SNF vs LTAC for 2 weeks of abx. Underwent placement of L arm midline PICC this morning.  Changed to Carbohydrate Modified Medium diet today. Potassium and phos WNL. Continues with order for Nepro Shake po TID and Prostat daily. NPO for breakfast and lunch. Meal intake remains insufficient, consuming 25-50%. Refused lunch today. Tried to talk with patient however she remains confused.  Height: Ht Readings from Last 1 Encounters:  08/28/12 5\' 4"  (1.626 m)    Weight Status:   Wt Readings from Last 1 Encounters:  08/28/12 103 lb 9.9 oz (47.001 kg)  106 lb s/p HD 3/11; trending down with HD  Body mass index  is 17.78 kg/(m^2). Underweight.  Re-estimated needs:  Kcal: 1600 - 1750 Protein: 65 - 75 grams Fluid: 1.2 liters  Skin: abdomen puncture  Diet Order: Carb Control Medium   Intake/Output Summary (Last 24 hours) at 08/29/12 1501 Last data filed at 08/29/12 1300  Gross per 24 hour  Intake    200 ml  Output      0 ml  Net    200 ml    Last BM: 3/17 (diarrhea)   Labs:   Recent Labs Lab 08/23/12 0642 08/25/12 0645 08/26/12 0615 08/27/12 0900  NA 135 134* 135 133*  K 3.3* 4.1 3.7 3.6  CL 98 98 99 97  CO2 30 29 29 27   BUN 18 31* 21 35*  CREATININE 1.68* 2.16* 1.74* 2.54*  CALCIUM 9.5 10.7* 10.4 10.5  MG  --   --  2.2  --   PHOS 3.1 3.4  --  3.6  GLUCOSE 59* 83 78 89    CBG (last 3)   Recent Labs  08/29/12 0815 08/29/12 1014 08/29/12 1207  GLUCAP 85 107* 72    Scheduled Meds: . aspirin EC  81 mg Oral Daily  . chlorpheniramine-HYDROcodone  5 mL Oral Q12H  . cinacalcet  60 mg Oral Q breakfast  . colistimethate (COLISTIN) IV  1.5 mg/kg/day Intravenous Q24H  . darbepoetin (ARANESP) injection - DIALYSIS  100 mcg Intravenous Q Thu-HD  . docusate sodium  100 mg Oral BID  . feeding supplement (NEPRO CARB STEADY)  237 mL Oral TID WC & HS  . feeding  supplement  30 mL Oral q morning - 10a  . ferric gluconate (FERRLECIT/NULECIT) IV  125 mg Intravenous Q T,Th,Sa-HD  . methadone  10 mg Oral QID  . metoprolol tartrate  6.2 mg Oral QHS  . multivitamin  1 tablet Oral QHS  . pantoprazole  40 mg Oral Daily  . sevelamer  400 mg Oral TID WC  . simvastatin  10 mg Oral q1800    Continuous Infusions:  none  Jarold Motto MS, RD, LDN Pager: [phone] After-hours pager: [phone]

## 2012-08-29 NOTE — Progress Notes (Signed)
pts cbg  49 this am was given dextrose by IV since she was NPO and not fully awake rechecked cbg and she came up to 85 will check every 4 hours until eating again . MD up pt seem more adiaphoric and wanted her cbg rechecked it was 107  Pt  Was encouraged to eat  After her procedure /picc placed in left upper arm by IR

## 2012-08-29 NOTE — Progress Notes (Signed)
Patient UJ:WJXBJYN A Frosch      DOB: May 19, 1951      WGN:[id number]   Palliative Medicine Team at [facility] Progress Note    Subjective:Patient alert, responses appropriate. Patient indicated pain remains localized to ULQ of abdomen, describes as aching throbbing, currently 8/10-became extremely lethargic yesterday, Gabapentin discontinued, Narcan administered, that was ordered 2 days prior. Get HD Tues-Thur-Sat     Filed Vitals:   08/29/12 1400  BP: 119/90  Pulse: 97  Temp: 97.5 F (36.4 C)  Resp: 18   Physical exam: General: Alert, responses appropriate HEENT: anicteric, buccal mucosa moist CHEST:CTA ABD: pain upon palpation in ULQ of abdomen-this has been chronic pain for patient (area of pancreatic pseudocyst-infected +yeast Cx) EXT: no pedal edema  Assessment and plan: Patient known to Palliative care service-has required chronic Methadone and Dilaudid use due to pain in abdomen from infected pseudocyst-has refused surgical intervention, but has had draining of site done in past. Currently being managed with systemic IV ABX  1) Code status: DNR/DNI 2) Pain: On Methadone 10 mg 4 times a day with as needed doses of oral Dilaudid every 6 hours as needed. Discussed the possibility of adjuvant low dose NSAID daily for pain. Patient agreeable ordered Meloxacam 7.5 mg daily (recommended dose in patients receiving HD). Protonix already ordered, patient eats well in morning informed her that she must eat something with pill at 10:00a, also informed nurses with order   Time In Time Out Total Time Spent with Patient Total Overall Time  3:50p 4:20p 30 min 30 min    Freddie Breech, CNS-C Palliative Medicine Team [facility] Team Phone: [phone] Pager: [phone]

## 2012-08-29 NOTE — Care Management Note (Signed)
Call placed to [facility] representative for [facility] [place]. Await response re bed availibility.  Johny Shock RN MPH, case manager, [phone]

## 2012-08-29 NOTE — Progress Notes (Signed)
[facility] PROGRESS NOTE Assessment/Plan: *Worsening leukocytosis and fevers due to abdominal abscess: - meropenem d/c as microorganism isolated is resistant to it. Patient afebrile. - ID consulted for antibiotics management. 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 -leukocytosis improving last 12.7 -patient stable; still with some abdominal pain but better overall. -will discuss with CM/SW discharge options.   Chronic systolic congestive heart failure, NYHA class 3 - no SOB, no further coughing spells'; Mild fluid overloaded. fine crackles, but no JVD. - Continue metoprolol but change to once a day given soft BP.  -volume to be manage with HD treatment.    Hyponatremia - resolved with HD    ESRD on hemodialysis - Continue HD T/T/S    Anemia in chronic kidney disease (CKD) - Hemoglobin 7.2 on admission. The patient has received 2 units of PRBCs 08/14/2012.  - Hemoglobin remains stable. Will cont monitoring -no acute signs of bleeding    Pancreatitis, chronic - stable; lipase WNL -pseudocyst/abscess as mentioned above.    Hypertension - BP soft, especially after HD; but overall stable.. -patient asymptomatic with low BP  -continue metoprolol once a day  AMS/Delirium: -due to neurontin most likely -medication discontinue -patient mentation improved/almost back to baseline again. -CT head w/o acute abnormalities.  Severe protein calorie malnutrition -continue nepro and prostat -patient encourage to increase PO intake -diet change to regular  Code Status: DNR  Family Communication: no family at bedside  Disposition Plan: to be detrmine; patient wants to go home; per PT she will  benefit of SNF and if treatment with daily IV antibiotics is pursuit she might qualify for LTAC.    Consultants:  Nephrology  ID  Procedures: Echo 3.5.2014: The estimated ejection fraction was in the range of 20% to 25%, Peak RV-RA gradient: 41mm Hg (S). percutaneous drain by  IR 3.8.2014.   Antibiotics:  vanc zosyn 3.4.2014- 3.5.2014  Meropenem: 3/12-3/13   colestin 08/25/12  HPI/Subjective Patient afebrile, and denies any nausea/vomiting. Appetite still poor; experiencing hypoglycemic events (especially at night). Experienced AMS/delirium yesterday evening, most likely due to use of neurontin; much better today. CT head W/O acute intracranial abnormalities.  Objective: Filed Vitals:   08/29/12 1000 08/29/12 1400 08/29/12 1831 08/29/12 2001  BP: 114/81 119/90 120/100 129/90  Pulse: 107 97 85 114  Temp: 99.3 F (37.4 C) 97.5 F (36.4 C) 97.5 F (36.4 C) 97.8 F (36.6 C)  TempSrc: Oral Oral Oral Oral  Resp: 18 18 18 18   Height:    5\' 4"  (1.626 m)  Weight:    46.998 kg (103 lb 9.8 oz)  SpO2: 99% 96% 92% 94%    Intake/Output Summary (Last 24 hours) at 08/29/12 2252 Last data filed at 08/29/12 2002  Gross per 24 hour  Intake      0 ml  Output      0 ml  Net      0 ml   Filed Weights   08/27/12 2035 08/28/12 2117 08/29/12 2001  Weight: 47.001 kg (103 lb 9.9 oz) 47.001 kg (103 lb 9.9 oz) 46.998 kg (103 lb 9.8 oz)    Exam:  General: Alert, awake, oriented x3, in no acute distress.  HEENT: No bruits, no goiter.  Heart:  Regular rate and rhythm, without murmurs, rubs, gallops.  Lungs: Good air movement, mild crackles bilaterally at bases. Abdomen: Soft, slight distended; Mild rebound to deep palpation. Still with left side pain EXT: trace to 1+ edema bilaterally and improving after each HD   Data Reviewed: Basic Metabolic Panel:  Recent Labs Lab 08/23/12 0642 08/25/12 0645 08/26/12 0615 08/27/12 0900  NA 135 134* 135 133*  K 3.3* 4.1 3.7 3.6  CL 98 98  99 97  CO2 30 29 29 27   GLUCOSE 59* 83 78 89  BUN 18 31* 21 35*  CREATININE 1.68* 2.16* 1.74* 2.54*  CALCIUM 9.5 10.7* 10.4 10.5  MG  --   --  2.2  --   PHOS 3.1 3.4  --  3.6   Liver Function Tests:  Recent Labs Lab 08/23/12 0642 08/25/12 0645 08/27/12 0900  ALBUMIN 1.2* 1.6* 1.5*   CBC:  Recent Labs Lab 08/23/12 0455 08/24/12 1217 08/25/12 0646 08/26/12 0615 08/27/12 0859  WBC 17.1* 12.3* 17.2* 12.5* 12.7*  HGB 8.7* 9.2* 8.9* 9.4* 8.9*  HCT 28.2* 29.6* 28.9* 30.0* 29.0*  MCV 93.7 92.2 93.2 91.7 92.9  PLT 183 207 234 233 248   BNP (last 3 results)  Recent Labs  04/21/12 1419 05/08/12 0515 06/02/12 1944  PROBNP >70000.0* >70000.0* >70000.0*   CBG:  Recent Labs Lab 08/29/12 0815 08/29/12 1014 08/29/12 1207 08/29/12 1720 08/29/12 1916  GLUCAP 85 107* 72 118* 110*    Recent Results (from the past 240 hour(s))  CULTURE, ROUTINE-ABSCESS     Status: None   Collection Time    08/20/12  2:14 PM      Result Value Range Status   Specimen Description ABSCESS   Final   Special Requests CT GUIDED ASPIRATION OF PANCREATIC PSEUDOCYST   Final   Gram Stain     Final   Value: ABUNDANT WBC PRESENT, PREDOMINANTLY PMN     NO SQUAMOUS EPITHELIAL CELLS SEEN     NO ORGANISMS SEEN   Culture     Final   Value: MODERATE ENTEROBACTER CLOACAE     Note: THIS ISOLATE HAS BEEN CONFIRMED AS A  [facility] CARBAPENEMASE PRODUCER CRITICAL RESULT CALLED TO, READ BACK BY AND VERIFIED WITH: AL W 3/13 @930  BY REAMM COLSITIN 0.125ug/mL ETEST results for this drug are "FOR INVESTIGATIONAL USE ONLY" and should NOT be      used for clinical purposes. TIGECYCLINE MIC=4 NO CLSI STANDARDS FOR INTERPRETATION  OF THIS DRUG FOR THIS ORGANISM   Report Status 08/26/2012 FINAL   Final   Organism ID, Bacteria ENTEROBACTER CLOACAE   Final  CULTURE, BLOOD (ROUTINE X 2)     Status: None   Collection Time    08/20/12  9:45 PM      Result Value Range Status   Specimen Description BLOOD LEFT ARM   Final    Special Requests BOTTLES DRAWN AEROBIC AND ANAEROBIC 10CC EACH   Final   Culture  Setup Time 08/21/2012 15:55   Final   Culture NO GROWTH 5 DAYS   Final   Report Status 08/29/2012 FINAL   Final  CULTURE, BLOOD (ROUTINE X 2)     Status: None   Collection Time    08/20/12 10:00 PM      Result Value Range Status   Specimen Description BLOOD LEFT HAND   Final   Special Requests BOTTLES DRAWN AEROBIC AND ANAEROBIC 10CC EACH   Final   Culture  Setup Time 08/21/2012 15:54   Final  Culture NO GROWTH 5 DAYS   Final   Report Status 08/29/2012 FINAL   Final     Studies: Ct Head Wo Contrast  08/28/2012  *RADIOLOGY REPORT*  Clinical Data: Acute mental status changes.  Patient unresponsive.  CT HEAD WITHOUT CONTRAST  Technique:  Contiguous axial images were obtained from the base of the skull through the vertex without contrast.  Comparison: Unenhanced cranial CT 10/05/2011, 07/02/2003.  MRI brain 08/06/2003.  Findings: Patient motion blurred several of the images, these were repeated with persistent motion, but ultimately a diagnostic study was obtained.  Ventricular system normal in size and appearance for age.  Stable mild cortical atrophy.  Stable mild changes of small vessel disease of the white matter.  No mass lesion.  No midline shift.  No acute hemorrhage or hematoma.  No extra-axial fluid collections.  No evidence of acute infarction.  Mild changes of hyperostosis frontalis interna.  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 Visualized paranasal sinuses, bilateral mastoid air cells, and bilateral middle ear cavities well-aerated. Extensive bilateral carotid siphon and vertebral artery atherosclerosis.  IMPRESSION:  1.  No acute intracranial abnormality. 2.  Stable mild cortical atrophy and mild chronic microvascular ischemic changes of the white matter.   Original Report Authenticated By: Hulan Saas, M.D.    Ir US Guide Vasc Access  Left  08/29/2012  *RADIOLOGY REPORT*  Clinical history:62 year old with end-stage renal disease and left subclavian cardiac ICD.  The patient has an infected pseudocyst and needs long-term IV antibiotics.  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  Request for a left arm midline PICC line.  PROCEDURE(S): PLACEMENT OF A LEFT ARM MIDLINE PICC LINE WITH ULTRASOUND AND FLUOROSCOPIC GUIDANCE  Physician: Rachelle Hora. Henn, [place]  Medications:None  Moderate sedation time:None  Fluoroscopy time: 0.4 minutes  Contrast:  8 ml Omnipaque-300  Procedure:Informed consent was obtained for left arm PICC line. The left arm was with ultrasound.  The patient had a patent left brachial vein by ultrasound.  The left side arm was prepped and draped in a sterile fashion.  Maximal barrier sterile technique was utilized including caps, mask, sterile gowns, sterile gloves, sterile drape, hand hygiene and skin antiseptic.  The skin was anesthetized with lidocaine.  21 gauge needle was directed into the left brachial vein with ultrasound guidance.  Wire was advanced into the left axilla.  Wire would not advance centrally.  Left upper extremity venogram was performed.  A dual lumen Power PICC line was cut to 15 cm.  Catheter placed through the peel-away sheath and positioned in the left axilla.  Both lumens aspirated and flushed well.  Catheter was sutured to the skin.Fluoroscopic and ultrasound images were taken and saved for documentation.  Findings:Left subclavian ICD.  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  There are large collateral vessels in the left upper chest that drain into the left innominate vein.  Complications: None  Impression:Successful placement of a left midline PICC line. Catheter tip is in the left axilla.  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   Original Report Authenticated By: Richarda Overlie,  M.D.    Ir Fluoro Guide Cv Midline Picc Left  08/29/2012  *RADIOLOGY REPORT*  Clinical history:62 year old with end-stage renal disease and left subclavian cardiac ICD.  The patient has an infected pseudocyst and needs  long-term IV antibiotics.  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  Request for a left arm midline PICC line.  PROCEDURE(S): PLACEMENT OF A LEFT ARM MIDLINE PICC LINE WITH ULTRASOUND AND FLUOROSCOPIC GUIDANCE  Physician: Rachelle Hora. Henn, [place]  Medications:None  Moderate sedation time:None  Fluoroscopy time: 0.4 minutes  Contrast:  8 ml Omnipaque-300  Procedure:Informed consent was obtained for left arm PICC line. The left arm was with ultrasound.  The patient had a patent left brachial vein by ultrasound.  The left side arm was prepped and draped in a sterile fashion.  Maximal barrier sterile technique was utilized including caps, mask, sterile gowns, sterile gloves, sterile drape, hand hygiene and skin antiseptic.  The skin was anesthetized with lidocaine.  21 gauge needle was directed into the left brachial vein with ultrasound guidance.  Wire was advanced into the left axilla.  Wire would not advance centrally.  Left upper extremity venogram was performed.  A dual lumen Power PICC line was cut to 15 cm.  Catheter placed through the peel-away sheath and positioned in the left axilla.  Both lumens aspirated and flushed well.  Catheter was sutured to the skin.Fluoroscopic and ultrasound images were taken and saved for documentation.  Findings:Left subclavian ICD.  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  There are large collateral vessels in the left upper chest that drain into the left innominate vein.  Complications: None  Impression:Successful placement of a left midline PICC line. Catheter tip is in the left axilla.  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    Original Report Authenticated By: Richarda Overlie, M.D.     Scheduled Meds: . aspirin EC  81 mg Oral Daily  . chlorpheniramine-HYDROcodone  5 mL Oral Q12H  . cinacalcet  60 mg Oral Q breakfast  . colistimethate (COLISTIN) IV  1.5 mg/kg/day Intravenous Q24H  . darbepoetin (ARANESP) injection - DIALYSIS  100 mcg Intravenous Q Thu-HD  . docusate sodium  100 mg Oral BID  . feeding supplement (NEPRO CARB STEADY)  237 mL Oral TID WC & HS  . feeding supplement  30 mL Oral q morning - 10a  . ferric gluconate (FERRLECIT/NULECIT) IV  125 mg Intravenous Q T,Th,Sa-HD  . [START ON 08/30/2012] meloxicam  7.5 mg Oral Daily  . methadone  10 mg Oral QID  . metoprolol tartrate  6.2 mg Oral QHS  . multivitamin  1 tablet Oral QHS  . [START ON 08/30/2012] pantoprazole  40 mg Oral Daily  . sevelamer  400 mg Oral TID WC  . simvastatin  10 mg Oral q1800  . sodium chloride  10-40 mL Intracatheter Q12H   Continuous Infusions:    Simona Rocque  [facility] Pager [phone].  If 8PM-8AM, please contact night-coverage at [email or web address], password [facility] 08/29/2012, 10:52 PM  LOS: 16 days

## 2012-08-29 NOTE — Progress Notes (Addendum)
Palliative Medicine Team SW Pt familiar to PMT, referred for psychosocial support surrounding multiple admissions, complex healthcare course. Pt gone for procedure upon visit. Will follow up.   4:07 PM  Psychosocial follow up with pt alone at bedside, and later with PMT NP Jannette Fogo. Pt alert, conversive, with fully uneaten lunch tray. Pt reports better clarity in cognition than yesterday when she report she was 'out of it'. Pt engaged, but presents with slowed speech/psychomotor response. Pt able to give her understanding that her current plan is to pursue LTAC for "14 days" to continue IV antibiotics, after which point she hopes to be able to go home. Pt reports primary support system as "like a dtr" Rhonda. Her own two biological children live in [place] and [place] and have had several conversation with PMT by phone. Pt denies plan to have hospice support have d/c to home. Pt reports past hx with hospice services and her perception that she is "not that sick", although she admits to having "mixed messages" from medical providers about whether hospice care is appropriate for her at this time. Pt engaged in life review, her [place], [place] origins and satisfaction with having never married. Pt appreciative of support, left to allow her time to visit with IV Team RN and PMT NP. Available as needed.   Kennieth Francois, LCSWA PMT phone [phone] Pager [phone]

## 2012-08-29 NOTE — Progress Notes (Signed)
Utilization review completed.  

## 2012-08-29 NOTE — H&P (Signed)
[name] is an 62 y.o. female.   Chief Complaint: ESRD Abdominal abscess Need antibiotics for at least 2 weeks  Being transferred to long term care soon Scheduled for tunneled Left arm peripherally central catheter placement; vs Left IJ PICC; vs PICC line placement  (per Dr Arlean Hopping note- pt has pacemaker) Has existing Rt chest HD catheter HPI: CAD; CHF; Hx noncompliance; Cirrhosis; pacemaker; ICD; HTN; DM; pancreatitis  Past Medical History  Diagnosis Date  . Coronary artery disease     s/p CABG 2008 with multiple PCIs  . Ischemic cardiomyopathy     Cath 04/2012, significant calcifications  . History of nonadherence to medical treatment   . Systolic CHF     16/1096 echo EF 10%  . Esophageal varices   . Cirrhosis     hx of fatty liver per patient, has known ascites with repeated paracenteses at [facility] as of 2013, also +hx of esoph varices with banding in the past  . Pacemaker   . ICD (implantable cardiac defibrillator) in place   . Hypertension     "used to have HTN; now I'm low" (05/23/2012)  . NSTEMI (non-ST elevated myocardial infarction)     04/2012; Trop peaked to 1.39; 05/23/2012 pt denies ever having MI  . Pneumonia 02/2012    "first time ever" (05/23/2012)  . Chronic bronchitis 1970's thru 2002    "went away when I stopped smoking in 2002" (05/23/2012)  . Diabetes mellitus     Diet controlled  . History of blood transfusion     "alot of them" (05/23/2012)  . Iron (Fe) deficiency anemia     "severe" (05/23/2012)  . Pancreatitis, chronic 10/04/2011    Had severe gallstone pancreatitis in May 2003, no surgery, treated medically then returned in June 2003 for cholecystectomy and cyst gastrostomy for pancreatic pseudocyst. In March 2013 was admitted for hemorrhage into psueodcyst. In April 2013 was treated at [facility] for polymicrobial bacteremia (fungal, MRSA and enterobacter) felt to be due to ruptured pseudocyst, treated medically. Admitted Jan 2014 with abd pain and had gas and  fluid in pancreatic bed. She refused surgery ("would not make it") and had fluid aspirated by IR- gram stain showed yeast and cultures grew enterococcus species, sensitive to amp and vanc. She was seen by ID and discharged on IV vanc/fortaz with HD and po voriconazole.     [name] ESRD on hemodialysis 05/06/2012    T,Th,Sat HD by [employer] [facility] on [address]. Started hemodialysis around July 2013.    [name] CHF (congestive heart failure)   . Complication of vascular access for dialysis 07/13/2012    L arm AVG (placed by Dr. Lyda Perone @ [facility]10/25) was ligated 1/6 after significant L arm swelling secondary to L SCV stenosis and pacemaker; new access to be placed at right arm on 1/29 by Dr. Hollace Hayward      Past Surgical History  Procedure Laterality Date  . Esophagogastroduodenoscopy  09/15/2011    Procedure: ESOPHAGOGASTRODUODENOSCOPY (EGD);  Surgeon: Theda Belfast, MD;  Location: [facility] ENDOSCOPY;  Service: Endoscopy;  Laterality: N/A;  . Cholecystectomy  2003  . Tonsillectomy and adenoidectomy  1961  . Appendectomy  1973?  [name] Vaginal hysterectomy  1973?  [name] Tubal ligation  1972  . Dilation and curettage of uterus      "bunch from profuse bleeding in the 1970's" (05/23/2012)  . Coronary artery bypass graft  2008    CABG X4  . Coronary angioplasty with stent placement  2008    "  1; day after CABG" (05/23/2012)  . Cardiac catheterization      "before 2008 and 3 wk ago" (05/23/2012)  . Insert / replace / remove pacemaker  06/2011    pacemaker ICD  . Cardiac defibrillator placement  06/2011  . Arteriovenous graft placement  03/2012    right antecub  . Insertion of dialysis catheter  ~ 10/2011    right chest  . Peritoneal catheter insertion  08/2011  . Peritoneal catheter removal  ~ 10/2011    Family History  Problem Relation Age of Onset  . Coronary artery disease Mother   . Hypertension Mother   . Diabetes Mother   . Diabetes Sister   . Anesthesia problems Neg Hx    Social History:  reports that  she quit smoking about 12 years ago. Her smoking use included Cigarettes. She has a 10.23 pack-year smoking history. She has never used smokeless tobacco. She reports that she does not drink alcohol or use illicit drugs.  Allergies:  Allergies  Allergen Reactions  . Codeine Itching  . Morphine And Related Shortness Of Breath    SOB when given IV once "to me it was mild; I called out fast for help; never had to intubate" (05/23/2012)  . Ace Inhibitors Other (See Comments)    Unknown reaction but her physician stated that she can't take it (specifically lisinopril)  . Tylenol (Acetaminophen) Other (See Comments)    Patient states that the doctor told her she has a Fatty Liver.    Medications Prior to Admission  Medication Sig Dispense Refill  . aspirin EC 81 MG EC tablet Take 1 tablet (81 mg total) by mouth daily.  30 tablet  0  . HYDROmorphone (DILAUDID) 2 MG tablet Take 1 tablet (2 mg total) by mouth every 6 (six) hours as needed for pain. For pain  30 tablet  0  . methadone (DOLOPHINE) 10 MG tablet Take 1 tablet (10 mg total) by mouth 4 (four) times daily.  120 tablet  0  . multivitamin (RENA-VIT) TABS tablet Take 1 tablet by mouth daily.      . Nutritional Supplements (FEEDING SUPPLEMENT, NEPRO CARB STEADY,) LIQD Take 237 mLs by mouth 2 (two) times daily with breakfast and lunch.      . pantoprazole (PROTONIX) 40 MG tablet Take 40 mg by mouth daily.      . pravastatin (PRAVACHOL) 20 MG tablet Take 20 mg by mouth at bedtime.       . ranolazine (RANEXA) 500 MG 12 hr tablet Take 500 mg by mouth daily.       . sevelamer (RENAGEL) 400 MG tablet Take 400 mg by mouth daily. With a meal        Results for orders placed during the hospital encounter of 08/13/12 (from the past 48 hour(s))  CBC     Status: Abnormal   Collection Time    08/27/12  8:59 AM      Result Value Range   WBC 12.7 (*) 4.0 - 10.5 K/uL   RBC 3.12 (*) 3.87 - 5.11 MIL/uL   Hemoglobin 8.9 (*) 12.0 - 15.0 g/dL   HCT 16.1  (*) 09.6 - 46.0 %   MCV 92.9  78.0 - 100.0 fL   MCH 28.5  26.0 - 34.0 pg   MCHC 30.7  30.0 - 36.0 g/dL   RDW 04.5 (*) 40.9 - 81.1 %   Platelets 248  150 - 400 K/uL  RENAL FUNCTION PANEL     Status:  Abnormal   Collection Time    08/27/12  9:00 AM      Result Value Range   Sodium 133 (*) 135 - 145 mEq/L   Potassium 3.6  3.5 - 5.1 mEq/L   Chloride 97  96 - 112 mEq/L   CO2 27  19 - 32 mEq/L   Glucose, Bld 89  70 - 99 mg/dL   BUN 35 (*) 6 - 23 mg/dL   Creatinine, Ser 1.61 (*) 0.50 - 1.10 mg/dL   Calcium 09.6  8.4 - 04.5 mg/dL   Phosphorus 3.6  2.3 - 4.6 mg/dL   Albumin 1.5 (*) 3.5 - 5.2 g/dL   GFR calc non Af Amer 19 (*) >90 mL/min   GFR calc Af Amer 22 (*) >90 mL/min   Comment:            The eGFR has been calculated     using the CKD EPI equation.     This calculation has not been     validated in all clinical     situations.     eGFR's persistently     <90 mL/min signify     possible Chronic Kidney Disease.  GLUCOSE, CAPILLARY     Status: Abnormal   Collection Time    08/27/12 10:11 AM      Result Value Range   Glucose-Capillary 106 (*) 70 - 99 mg/dL  GLUCOSE, CAPILLARY     Status: None   Collection Time    08/27/12  5:43 PM      Result Value Range   Glucose-Capillary 91  70 - 99 mg/dL  GLUCOSE, CAPILLARY     Status: None   Collection Time    08/27/12  8:38 PM      Result Value Range   Glucose-Capillary 95  70 - 99 mg/dL  GLUCOSE, CAPILLARY     Status: Abnormal   Collection Time    08/28/12  7:51 AM      Result Value Range   Glucose-Capillary 43 (*) 70 - 99 mg/dL  GLUCOSE, CAPILLARY     Status: None   Collection Time    08/28/12  8:23 AM      Result Value Range   Glucose-Capillary 78  70 - 99 mg/dL  GLUCOSE, CAPILLARY     Status: Abnormal   Collection Time    08/28/12  9:31 AM      Result Value Range   Glucose-Capillary 127 (*) 70 - 99 mg/dL  GLUCOSE, CAPILLARY     Status: Abnormal   Collection Time    08/28/12 11:56 AM      Result Value Range    Glucose-Capillary 158 (*) 70 - 99 mg/dL   Comment 1 Documented in Chart     Comment 2 Notify RN    GLUCOSE, CAPILLARY     Status: Abnormal   Collection Time    08/28/12  5:29 PM      Result Value Range   Glucose-Capillary 130 (*) 70 - 99 mg/dL   Comment 1 Documented in Chart     Comment 2 Notify RN    GLUCOSE, CAPILLARY     Status: Abnormal   Collection Time    08/28/12  6:51 PM      Result Value Range   Glucose-Capillary 100 (*) 70 - 99 mg/dL  GLUCOSE, CAPILLARY     Status: None   Collection Time    08/28/12  9:15 PM      Result Value Range  Glucose-Capillary 74  70 - 99 mg/dL  GLUCOSE, CAPILLARY     Status: Abnormal   Collection Time    08/29/12 12:03 AM      Result Value Range   Glucose-Capillary 108 (*) 70 - 99 mg/dL  GLUCOSE, CAPILLARY     Status: None   Collection Time    08/29/12  3:49 AM      Result Value Range   Glucose-Capillary 77  70 - 99 mg/dL  GLUCOSE, CAPILLARY     Status: Abnormal   Collection Time    08/29/12  7:35 AM      Result Value Range   Glucose-Capillary 49 (*) 70 - 99 mg/dL   Ct Head Wo Contrast  08/28/2012  *RADIOLOGY REPORT*  Clinical Data: Acute mental status changes.  Patient unresponsive.  CT HEAD WITHOUT CONTRAST  Technique:  Contiguous axial images were obtained from the base of the skull through the vertex without contrast.  Comparison: Unenhanced cranial CT 10/05/2011, 07/02/2003.  MRI brain 08/06/2003.  Findings: Patient motion blurred several of the images, these were repeated with persistent motion, but ultimately a diagnostic study was obtained.  Ventricular system normal in size and appearance for age.  Stable mild cortical atrophy.  Stable mild changes of small vessel disease of the white matter.  No mass lesion.  No midline shift.  No acute hemorrhage or hematoma.  No extra-axial fluid collections.  No evidence of acute infarction.  Mild changes of hyperostosis frontalis interna.  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 Visualized paranasal sinuses, bilateral mastoid air cells, and bilateral middle ear cavities well-aerated. Extensive bilateral carotid siphon and vertebral artery atherosclerosis.  IMPRESSION:  1.  No acute intracranial abnormality. 2.  Stable mild cortical atrophy and mild chronic microvascular ischemic changes of the white matter.   Original Report Authenticated By: Hulan Saas, M.D.     Review of Systems  Constitutional: Positive for weight loss. Negative for fever and chills.  Cardiovascular: Negative for chest pain.  Gastrointestinal: Positive for nausea and abdominal pain. Negative for vomiting.  Neurological: Positive for weakness.    Blood pressure 115/76, pulse 104, temperature 98.1 F (36.7 C), temperature source Oral, resp. rate 18, height 5\' 4"  (1.626 m), weight 103 lb 9.9 oz (47.001 kg), SpO2 95.00%. Physical Exam  Constitutional: She is oriented to person, place, and time.  Thin, frail  Cardiovascular: Normal rate, regular rhythm and normal heart sounds.   No murmur heard. Respiratory: Effort normal and breath sounds normal. She has no wheezes.  GI: Soft. Bowel sounds are normal. There is no tenderness.  Musculoskeletal:  Moves all 4s; weak Existing Rt dialysis catheter  Neurological: She is alert and oriented to person, place, and time.  Answers all ?s appropriately; weak and unable to sign consent witnessed pts "X"  Psychiatric: Judgment and thought content normal.     Assessment/Plan abd abscess Need for long term antibx and being transferred to long term care ESRD Scheduled for tunneled Left arm PICC vs Left IJ PICC in IR Pt aware of procedure benefits and risks and agreeable to proceed Consent signed and in chart  TURPIN,PAMELA A 08/29/2012, 7:41 AM

## 2012-08-29 NOTE — Procedures (Signed)
Placement of left arm midline PICC.  Tip in left axilla.  Left subclavian vein is occluded but the left innominate and left IJ appear to be patent.

## 2012-08-30 LAB — CBC
HCT: 24.3 % — ABNORMAL LOW (ref 36.0–46.0)
Hemoglobin: 7.5 g/dL — ABNORMAL LOW (ref 12.0–15.0)
MCH: 28.8 pg (ref 26.0–34.0)
MCHC: 30.9 g/dL (ref 30.0–36.0)
MCV: 93.5 fL (ref 78.0–100.0)
Platelets: 227 10*3/uL (ref 150–400)
RBC: 2.6 MIL/uL — ABNORMAL LOW (ref 3.87–5.11)
RDW: 17.8 % — ABNORMAL HIGH (ref 11.5–15.5)
WBC: 11.5 10*3/uL — ABNORMAL HIGH (ref 4.0–10.5)

## 2012-08-30 LAB — RENAL FUNCTION PANEL
Albumin: 1.3 g/dL — ABNORMAL LOW (ref 3.5–5.2)
BUN: 48 mg/dL — ABNORMAL HIGH (ref 6–23)
CO2: 23 mEq/L (ref 19–32)
Calcium: 9.3 mg/dL (ref 8.4–10.5)
Chloride: 112 mEq/L (ref 96–112)
Creatinine, Ser: 2.8 mg/dL — ABNORMAL HIGH (ref 0.50–1.10)
GFR calc Af Amer: 20 mL/min — ABNORMAL LOW (ref >90)
GFR calc non Af Amer: 17 mL/min — ABNORMAL LOW (ref >90)
Glucose, Bld: 126 mg/dL — ABNORMAL HIGH (ref 70–99)
Phosphorus: 4.3 mg/dL (ref 2.3–4.6)
Potassium: 4.4 mEq/L (ref 3.5–5.1)
Sodium: 147 mEq/L — ABNORMAL HIGH (ref 135–145)

## 2012-08-30 LAB — GLUCOSE, CAPILLARY
Glucose-Capillary: 49 mg/dL — ABNORMAL LOW (ref 70–99)
Glucose-Capillary: 70 mg/dL (ref 70–99)
Glucose-Capillary: 77 mg/dL (ref 70–99)

## 2012-08-30 MED ORDER — HEPARIN SODIUM (PORCINE) 1000 UNIT/ML DIALYSIS: 20.0000 [IU]/kg | INTRAMUSCULAR | Status: DC | PRN

## 2012-08-30 MED ORDER — SODIUM CHLORIDE 0.9 % IV SOLN: 100.0000 mL | INTRAVENOUS | Status: DC | PRN

## 2012-08-30 MED ORDER — LIDOCAINE-PRILOCAINE 2.5-2.5 % EX CREA: 1.0000 "application " | TOPICAL_CREAM | CUTANEOUS | Status: DC | PRN

## 2012-08-30 MED ORDER — LIDOCAINE HCL (PF) 1 % IJ SOLN: 5.0000 mL | INTRAMUSCULAR | Status: DC | PRN

## 2012-08-30 MED ORDER — HYDROMORPHONE HCL PF 1 MG/ML IJ SOLN
INTRAMUSCULAR | Status: AC
  Administered 2012-08-30: 1 mg via INTRAVENOUS
  Filled 2012-08-30: qty 1

## 2012-08-30 MED ORDER — ALTEPLASE 2 MG IJ SOLR: 2.0000 mg | Freq: Once | INTRAMUSCULAR | Status: DC | PRN

## 2012-08-30 MED ORDER — HEPARIN SODIUM (PORCINE) 1000 UNIT/ML DIALYSIS: 1000.0000 [IU] | INTRAMUSCULAR | Status: DC | PRN

## 2012-08-30 MED ORDER — NEPRO/CARBSTEADY PO LIQD: 237.0000 mL | ORAL | Status: DC | PRN

## 2012-08-30 MED ORDER — PENTAFLUOROPROP-TETRAFLUOROETH EX AERO: 1.0000 "application " | INHALATION_SPRAY | CUTANEOUS | Status: DC | PRN

## 2012-08-30 NOTE — Progress Notes (Signed)
[facility] PROGRESS NOTE Assessment/Plan: *Worsening leukocytosis and fevers due to abdominal abscess: - meropenem d/c as microorganism isolated is resistant to it. Patient afebrile. - ID consulted for antibiotics management. 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 -leukocytosis improving last 12.7 -patient stable; still with some abdominal pain but better overall. -will discuss with CM/SW discharge options.   Chronic systolic congestive heart failure, NYHA class 3 - no SOB, no further coughing spells'; Mild fluid overloaded. fine crackles, but no JVD. - Continue metoprolol but change to once a day given soft BP.  -volume to be manage with HD treatment.    Hyponatremia - resolved with HD -now Na 147    ESRD on hemodialysis - Continue HD T/T/S    Anemia in chronic kidney disease (CKD) - Hemoglobin 7.2 on admission. The patient has received 2 units of PRBCs 08/14/2012.  - Hemoglobin remains stable. Will cont monitoring -no acute signs of bleeding    Pancreatitis, chronic - stable; lipase WNL -pseudocyst/abscess as mentioned above.    Hypertension - BP soft, especially after HD; but overall stable.. -patient asymptomatic with low BP  -continue metoprolol once a day  AMS/Delirium: -due to neurontin most likely -medication discontinue -patient mentation improved; but not back to baseline yet. -CT head w/o acute abnormalities.  Severe protein calorie malnutrition -continue nepro and prostat -patient encourage to increase PO intake -diet change to regular to give more options and make easier intake.  Code Status: DNR  Family Communication: no family at bedside  Disposition Plan:  to be detrmine; patient wants to go home; per PT she will benefit of SNF and if treatment with daily IV antibiotics is pursuit she might qualify for LTAC.    Consultants:  Nephrology  ID  Procedures: Echo 3.5.2014: The estimated ejection fraction was in the range of 20% to 25%, Peak RV-RA gradient: 41mm Hg (S). percutaneous drain by  IR 3.8.2014.   Antibiotics:  vanc zosyn 3.4.2014- 3.5.2014  Meropenem: 3/12-3/13   colestin 08/25/12  HPI/Subjective Patient afebrile, and denies any nausea/vomiting. Appetite still poor; experiencing hypoglycemic events (especially at night). Mentation improved from yesterday but no back to baseline.  Objective: Filed Vitals:   08/30/12 1510 08/30/12 1513 08/30/12 1616 08/30/12 2044  BP: 99/49 100/56 118/74 122/78  Pulse: 82 84 92 101  Temp:  96.6 F (35.9 C) 98 F (36.7 C) 99.9 F (37.7 C)  TempSrc:  Oral Oral Oral  Resp: 18 17 18 18   Height:      Weight:  48.2 kg (106 lb 4.2 oz)  49.2 kg (108 lb 7.5 oz)  SpO2:  100% 93% 95%    Intake/Output Summary (Last 24 hours) at 08/30/12 2216 Last data filed at 08/30/12 1600  Gross per 24 hour  Intake    760 ml  Output   1150 ml  Net   -390 ml   Filed Weights   08/30/12 1054 08/30/12 1513 08/30/12 2044  Weight: 49.4 kg (108 lb 14.5 oz) 48.2 kg (106 lb 4.2 oz) 49.2 kg (108 lb 7.5 oz)    Exam:  General: Alert, awake, oriented x2, in no acute distress.  HEENT: No bruits, no goiter.  Heart: Regular rate and rhythm, without murmurs, rubs,  gallops.  Lungs: Good air movement, mild crackles bilaterally at bases. Abdomen: Soft, slight distended; Mild rebound to deep palpation. Still with left side pain EXT: trace edema bilaterally  Data Reviewed: Basic Metabolic Panel:  Recent Labs Lab 08/25/12 0645 08/26/12 0615 08/27/12 0900 08/30/12 1103  NA 134* 135 133* 147*  K 4.1 3.7 3.6 4.4  CL 98 99 97 112  CO2 29 29 27 23   GLUCOSE 83 78 89 126*  BUN 31* 21 35* 48*  CREATININE 2.16*  1.74* 2.54* 2.80*  CALCIUM 10.7* 10.4 10.5 9.3  MG  --  2.2  --   --   PHOS 3.4  --  3.6 4.3   Liver Function Tests:  Recent Labs Lab 08/25/12 0645 08/27/12 0900 08/30/12 1103  ALBUMIN 1.6* 1.5* 1.3*   CBC:  Recent Labs Lab 08/24/12 1217 08/25/12 0646 08/26/12 0615 08/27/12 0859 08/30/12 1103  WBC 12.3* 17.2* 12.5* 12.7* 11.5*  HGB 9.2* 8.9* 9.4* 8.9* 7.5*  HCT 29.6* 28.9* 30.0* 29.0* 24.3*  MCV 92.2 93.2 91.7 92.9 93.5  PLT 207 234 233 248 227   BNP (last 3 results)  Recent Labs  04/21/12 1419 05/08/12 0515 06/02/12 1944  PROBNP >70000.0* >70000.0* >70000.0*   CBG:  Recent Labs Lab 08/30/12 0426 08/30/12 0758 08/30/12 0918 08/30/12 1614 08/30/12 2043  GLUCAP 70 49* 96 80 69*    Studies: Ct Head Wo Contrast  08/28/2012  *RADIOLOGY REPORT*  Clinical Data: Acute mental status changes.  Patient unresponsive.  CT HEAD WITHOUT CONTRAST  Technique:  Contiguous axial images were obtained from the base of the skull through the vertex without contrast.  Comparison: Unenhanced cranial CT 10/05/2011, 07/02/2003.  MRI brain 08/06/2003.  Findings: Patient motion blurred several of the images, these were repeated with persistent motion, but ultimately a diagnostic study was obtained.  Ventricular system normal in size and appearance for age.  Stable mild cortical atrophy.  Stable mild changes of small vessel disease of the white matter.  No mass lesion.  No midline shift.  No acute hemorrhage or hematoma.  No extra-axial fluid collections.  No evidence of acute infarction.  Mild changes of hyperostosis frontalis interna.  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 Visualized paranasal sinuses, bilateral mastoid air cells, and bilateral middle ear cavities well-aerated. Extensive bilateral carotid siphon and vertebral artery atherosclerosis.  IMPRESSION:  1.  No acute intracranial abnormality. 2.  Stable mild cortical  atrophy and mild chronic microvascular ischemic changes of the white matter.   Original Report Authenticated By: Hulan Saas, M.D.    Ir US Guide Vasc Access Left  08/29/2012  *RADIOLOGY REPORT*  Clinical history:62 year old with end-stage renal disease and left subclavian cardiac ICD.  The patient has an infected pseudocyst and needs long-term IV antibiotics.  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  Request for a left arm midline PICC line.  PROCEDURE(S): PLACEMENT OF A LEFT ARM MIDLINE PICC LINE WITH ULTRASOUND AND FLUOROSCOPIC GUIDANCE  Physician: Rachelle Hora. Henn, MD  Medications:None  Moderate sedation time:None  Fluoroscopy time: 0.4 minutes  Contrast:  8 ml Omnipaque-300  Procedure:Informed consent was obtained for left arm PICC line. The left arm was with ultrasound.  The patient had a patent left brachial vein by ultrasound.  The left side arm was prepped and draped in a sterile fashion.  Maximal barrier sterile technique was  utilized including caps, mask, sterile gowns, sterile gloves, sterile drape, hand hygiene and skin antiseptic.  The skin was anesthetized with lidocaine.  21 gauge needle was directed into the left brachial vein with ultrasound guidance.  Wire was advanced into the left axilla.  Wire would not advance centrally.  Left upper extremity venogram was performed.  A dual lumen Power PICC line was cut to 15 cm.  Catheter placed through the peel-away sheath and positioned in the left axilla.  Both lumens aspirated and flushed well.  Catheter was sutured to the skin.Fluoroscopic and ultrasound images were taken and saved for documentation.  Findings:Left subclavian ICD.  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  There are large collateral vessels in the left upper chest that drain into the left innominate vein.  Complications: None  Impression:Successful placement of a left  midline PICC line. Catheter tip is in the left axilla.  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   Original Report Authenticated By: Richarda Overlie, M.D.    Ir Fluoro Guide Cv Midline Picc Left  08/29/2012  *RADIOLOGY REPORT*  Clinical history:62 year old with end-stage renal disease and left subclavian cardiac ICD.  The patient has an infected pseudocyst and needs long-term IV antibiotics.  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  Request for a left arm midline PICC line.  PROCEDURE(S): PLACEMENT OF A LEFT ARM MIDLINE PICC LINE WITH ULTRASOUND AND FLUOROSCOPIC GUIDANCE  Physician: Rachelle Hora. Henn, MD  Medications:None  Moderate sedation time:None  Fluoroscopy time: 0.4 minutes  Contrast:  8 ml Omnipaque-300  Procedure:Informed consent was obtained for left arm PICC line. The left arm was with ultrasound.  The patient had a patent left brachial vein by ultrasound.  The left side arm was prepped and draped in a sterile fashion.  Maximal barrier sterile technique was utilized including caps, mask, sterile gowns, sterile gloves, sterile drape, hand hygiene and skin antiseptic.  The skin was anesthetized with lidocaine.  21 gauge needle was directed into the left brachial vein with ultrasound guidance.  Wire was advanced into the left axilla.  Wire would not advance centrally.  Left upper extremity venogram was performed.  A dual lumen Power PICC line was cut to 15 cm.  Catheter placed through the peel-away sheath and positioned in the left axilla.  Both lumens aspirated and flushed well.  Catheter was sutured to the skin.Fluoroscopic and ultrasound images were taken and saved for documentation.  Findings:Left subclavian ICD.  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  There are large collateral vessels in the left upper chest that drain into the left innominate vein.  Complications: None   Impression:Successful placement of a left midline PICC line. Catheter tip is in the left axilla.  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   Original Report Authenticated By: Richarda Overlie, M.D.     Scheduled Meds: . aspirin EC  81 mg Oral Daily  . chlorpheniramine-HYDROcodone  5 mL Oral Q12H  . cinacalcet  60 mg Oral Q breakfast  . colistimethate (COLISTIN) IV  1.5 mg/kg/day Intravenous Q24H  . darbepoetin (ARANESP) injection - DIALYSIS  100 mcg Intravenous Q Thu-HD  . docusate sodium  100 mg Oral BID  . feeding supplement (NEPRO CARB STEADY)  237 mL Oral TID WC & HS  . feeding supplement  30 mL Oral q  morning - 10a  . ferric gluconate (FERRLECIT/NULECIT) IV  125 mg Intravenous Q T,Th,Sa-HD  . meloxicam  7.5 mg Oral Daily  . methadone  10 mg Oral QID  . metoprolol tartrate  6.2 mg Oral QHS  . multivitamin  1 tablet Oral QHS  . pantoprazole  40 mg Oral Daily  . sevelamer  400 mg Oral TID WC  . simvastatin  10 mg Oral q1800  . sodium chloride  10-40 mL Intracatheter Q12H   Continuous Infusions:    Courtni Balash  [facility] Pager [phone].  If 8PM-8AM, please contact night-coverage at [email or web address], password [facility] 08/30/2012, 10:16 PM  LOS: 17 days

## 2012-08-30 NOTE — Progress Notes (Signed)
PT Cancellation Note  Patient Details Name: [name] MRN: [id number] DOB: December 28, 1950   Cancelled Treatment:    Reason Eval/Treat Not Completed: Patient at procedure or test/unavailable (pt currently in HD and unable to participate)Will attempt next date.  Delaney Meigs, PT [phone]

## 2012-08-30 NOTE — Progress Notes (Signed)
Palliative Medicine Team SW Psychosocial follow up. Pt intermittently confused, oriented to time and place, but thought and speech disturbed. Provided supportive presence, encouraged pt to rest. Reviewed with RN who shared my observations. Will follow up.   Kennieth Francois, LCSWA PMT Phone [phone] Pager [phone]

## 2012-08-30 NOTE — Progress Notes (Signed)
Assessment/Plan:  1. Infected pseudocyst - [facility] - Rx colistin started; IR  placed PICC on Left  2. ESRD - TTS - plan HD Today 3. Anemia - Hgb 8.9>9.4>8.9 variable, but overall stable s/p 2 units PRBC 3/02. Aranesp 100; repleteing iron  5. HTN/volume - BP stable   7. Chronic systolic CHF, NYHA class 3 - per primary also severe MR from last echo done in Mar '14  8. Mental status changes - Head CT neg; neurontin stopped 8. DNR -Palliative care following; planning LTAC for q day antibioitcs  Subjective: Interval History: generalized malaise.  For HD today  Objective: Vital signs in last 24 hours: Temp:  [97.4 F (36.3 C)-97.8 F (36.6 C)] 97.4 F (36.3 C) (03/18 1054) Pulse Rate:  [71-114] 82 (03/18 1130) Resp:  [13-20] 13 (03/18 1130) BP: (86-129)/(60-100) 86/66 mmHg (03/18 1130) SpO2:  [92 %-99 %] 95 % (03/18 1100) Weight:  [46.998 kg (103 lb 9.8 oz)-49.4 kg (108 lb 14.5 oz)] 49.4 kg (108 lb 14.5 oz) (03/18 1054) Weight change: -0.003 kg (-0.1 oz)  Intake/Output from previous day:   Intake/Output this shift: Total I/O In: 360 [P.O.:360] Out: -   General appearance: alert, cooperative and slowed mentation No edema. Abd sl tender Oriented Lab Results:  Recent Labs  08/30/12 1103  WBC 11.5*  HGB 7.5*  HCT 24.3*  PLT 227   BMET:  Recent Labs  08/30/12 1103  NA 147*  K 4.4  CL 112  CO2 23  GLUCOSE 126*  BUN 48*  CREATININE 2.80*  CALCIUM 9.3   No results found for this basename: PTH,  in the last 72 hours Iron Studies: No results found for this basename: IRON, TIBC, TRANSFERRIN, FERRITIN,  in the last 72 hours Studies/Results: Ct Head Wo Contrast  08/28/2012  *RADIOLOGY REPORT*  Clinical Data: Acute mental status changes.  Patient unresponsive.  CT HEAD WITHOUT CONTRAST  Technique:  Contiguous axial images were obtained from the base of the skull through the vertex without contrast.  Comparison: Unenhanced cranial CT 10/05/2011, 07/02/2003.  MRI brain  08/06/2003.  Findings: Patient motion blurred several of the images, these were repeated with persistent motion, but ultimately a diagnostic study was obtained.  Ventricular system normal in size and appearance for age.  Stable mild cortical atrophy.  Stable mild changes of small vessel disease of the white matter.  No mass lesion.  No midline shift.  No acute hemorrhage or hematoma.  No extra-axial fluid collections.  No evidence of acute infarction.  Mild changes of hyperostosis frontalis interna.  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 Visualized paranasal sinuses, bilateral mastoid air cells, and bilateral middle ear cavities well-aerated. Extensive bilateral carotid siphon and vertebral artery atherosclerosis.  IMPRESSION:  1.  No acute intracranial abnormality. 2.  Stable mild cortical atrophy and mild chronic microvascular ischemic changes of the white matter.   Original Report Authenticated By: Hulan Saas, M.D.    Ir US Guide Vasc Access Left  08/29/2012  *RADIOLOGY REPORT*  Clinical history:62 year old with end-stage renal disease and left subclavian cardiac ICD.  The patient has an infected pseudocyst and needs long-term IV antibiotics.  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  Request for a left arm midline PICC line.  PROCEDURE(S): PLACEMENT OF A LEFT ARM MIDLINE PICC LINE WITH ULTRASOUND AND FLUOROSCOPIC GUIDANCE  Physician: Madelaine Bhat  Harriet Pho, MD  Medications:None  Moderate sedation time:None  Fluoroscopy time: 0.4 minutes  Contrast:  8 ml Omnipaque-300  Procedure:Informed consent was obtained for left arm PICC line. The left arm was with ultrasound.  The patient had a patent left brachial vein by ultrasound.  The left side arm was prepped and draped in a sterile fashion.  Maximal barrier sterile technique was utilized including caps, mask, sterile  gowns, sterile gloves, sterile drape, hand hygiene and skin antiseptic.  The skin was anesthetized with lidocaine.  21 gauge needle was directed into the left brachial vein with ultrasound guidance.  Wire was advanced into the left axilla.  Wire would not advance centrally.  Left upper extremity venogram was performed.  A dual lumen Power PICC line was cut to 15 cm.  Catheter placed through the peel-away sheath and positioned in the left axilla.  Both lumens aspirated and flushed well.  Catheter was sutured to the skin.Fluoroscopic and ultrasound images were taken and saved for documentation.  Findings:Left subclavian ICD.  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  There are large collateral vessels in the left upper chest that drain into the left innominate vein.  Complications: None  Impression:Successful placement of a left midline PICC line. Catheter tip is in the left axilla.  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   Original Report Authenticated By: Richarda Overlie, M.D.    Ir Fluoro Guide Cv Midline Picc Left  08/29/2012  *RADIOLOGY REPORT*  Clinical history:62 year old with end-stage renal disease and left subclavian cardiac ICD.  The patient has an infected pseudocyst and needs long-term IV antibiotics.  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  Request for a left arm midline PICC line.  PROCEDURE(S): PLACEMENT OF A LEFT ARM MIDLINE PICC LINE WITH ULTRASOUND AND FLUOROSCOPIC GUIDANCE  Physician: Rachelle Hora. Henn, MD  Medications:None  Moderate sedation time:None  Fluoroscopy time: 0.4 minutes  Contrast:  8 ml Omnipaque-300  Procedure:Informed consent was obtained for left arm PICC line. The left arm was with ultrasound.  The patient had a patent left brachial vein by ultrasound.  The left side arm was prepped and draped in a sterile fashion.  Maximal barrier sterile technique  was utilized including caps, mask, sterile gowns, sterile gloves, sterile drape, hand hygiene and skin antiseptic.  The skin was anesthetized with lidocaine.  21 gauge needle was directed into the left brachial vein with ultrasound guidance.  Wire was advanced into the left axilla.  Wire would not advance centrally.  Left upper extremity venogram was performed.  A dual lumen Power PICC line was cut to 15 cm.  Catheter placed through the peel-away sheath and positioned in the left axilla.  Both lumens aspirated and flushed well.  Catheter was sutured to the skin.Fluoroscopic and ultrasound images were taken and saved for documentation.  Findings:Left subclavian ICD.  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  There are large collateral vessels in the left upper chest that drain into the left innominate vein.  Complications: None  Impression:Successful placement of a left midline PICC line. Catheter tip is in the left axilla.  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   Original Report Authenticated By: Richarda Overlie, M.D.    Scheduled: . aspirin EC  81 mg Oral Daily  . chlorpheniramine-HYDROcodone  5 mL Oral Q12H  .  cinacalcet  60 mg Oral Q breakfast  . colistimethate (COLISTIN) IV  1.5 mg/kg/day Intravenous Q24H  . darbepoetin (ARANESP) injection - DIALYSIS  100 mcg Intravenous Q Thu-HD  . docusate sodium  100 mg Oral BID  . feeding supplement (NEPRO CARB STEADY)  237 mL Oral TID WC & HS  . feeding supplement  30 mL Oral q morning - 10a  . ferric gluconate (FERRLECIT/NULECIT) IV  125 mg Intravenous Q T,Th,Sa-HD  . meloxicam  7.5 mg Oral Daily  . methadone  10 mg Oral QID  . metoprolol tartrate  6.2 mg Oral QHS  . multivitamin  1 tablet Oral QHS  . pantoprazole  40 mg Oral Daily  . sevelamer  400 mg Oral TID WC  . simvastatin  10 mg Oral q1800  . sodium chloride  10-40 mL Intracatheter Q12H     LOS: 17 days   Viva Gallaher C 08/30/2012,11:55 AM

## 2012-08-31 DIAGNOSIS — N186 End stage renal disease: Secondary | ICD-10-CM

## 2012-08-31 LAB — GLUCOSE, CAPILLARY
Glucose-Capillary: 52 mg/dL — ABNORMAL LOW (ref 70–99)
Glucose-Capillary: 85 mg/dL (ref 70–99)
Glucose-Capillary: 127 mg/dL — ABNORMAL HIGH (ref 70–99)

## 2012-08-31 MED ORDER — HYDROMORPHONE HCL PF 1 MG/ML IJ SOLN
0.5000 mg | INTRAMUSCULAR | Status: DC | PRN
  Administered 2012-09-01 – 2012-09-02 (×4): 0.5 mg via INTRAVENOUS
  Filled 2012-08-31 (×4): qty 1

## 2012-08-31 MED ORDER — DARBEPOETIN ALFA-POLYSORBATE 200 MCG/0.4ML IJ SOLN
200.0000 ug | INTRAMUSCULAR | Status: DC
  Filled 2012-08-31: qty 0.4

## 2012-08-31 NOTE — Progress Notes (Signed)
Palliative Medicine Team SW Psychosocial follow up. Pt's mental status continues to wax and wane; pt aware and distressed by this. Pt continues to refuse meals, agreed to a bite of cake and one ziti noodle, cites her distaste for foods delivered, but unable to give alternative ideas to RN. Engaged pt in reminiscence of her favorite foods; she reports liking fried chicken, but only her own. Pt also reports enjoying ginger ale, which I brought her with RN's approval; she had a few sips with prompts.   Pt became quite tearful at one point of visit in regard to her mental status, endorsing feelings of fear, suspicion, and general unease at not knowing "why has this happened to me?". Engaged pt in more reminiscence to distract and calm her. 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 Will continue to follow for emotional support.   Kennieth Francois, LCSWA PMT Phone [phone] Pager [phone]

## 2012-08-31 NOTE — Progress Notes (Signed)
NUTRITION FOLLOW UP  DOCUMENTATION CODES  Per approved criteria   -Severe malnutrition in the context of chronic illness    Intervention:   1. Agree with liberalized diet. Continue to encourage Nepro Shakes and prostat. 2. Pt is continuing to refuse meals, if continues, strongly recommend initiation of enteral nutrition given ongoing severe malnutrition. 3. RD to continue to follow nutrition care plan  Nutrition Dx:   Increased nutrient needs related to HD as evidenced by estimated needs. Ongoing.  Goal:   Pt to meet >/= 90% of their estimated nutrition needs.  Monitor:   weight trends, lab trends, I/O's, PO intake, supplement tolerance  Assessment:   Admitted with worsening SOB, work-up reveals HCAP. Pt also noted to have missed HD session PTA. CT scan of abdomen and pelvis showed 2 new fluid and gas collection along the left lateral abdominal wall and the body of the pancreas. Surgery consulted, however pt is very high risk for any sort of surgical intervention and she is aware of this and essentially refuses any surgery. Recommendations include percutaneous drainage.  Changed to Regular diet to promote intake. Potassium and phos WNL. Continues with order for Nepro Shake po TID and Prostat daily. Meal intake remains very poor, consuming 0-10%.   Height: Ht Readings from Last 1 Encounters:  08/29/12 5\' 4"  (1.626 m)    Weight Status:   Wt Readings from Last 1 Encounters:  08/30/12 108 lb 7.5 oz (49.2 kg)  106 lb s/p HD 3/11 and 3/18; trending down with HD  Body mass index is 18.61 kg/(m^2).   Re-estimated needs:  Kcal: 1600 - 1750 Protein: 65 - 75 grams Fluid: 1.2 liters  Skin: abdomen puncture  Diet Order: General    Intake/Output Summary (Last 24 hours) at 08/31/12 1113 Last data filed at 08/30/12 1600  Gross per 24 hour  Intake    400 ml  Output   1150 ml  Net   -750 ml    Last BM: 3/17    Labs:   Recent Labs Lab 08/25/12 0645 08/26/12 0615  08/27/12 0900 08/30/12 1103  NA 134* 135 133* 147*  K 4.1 3.7 3.6 4.4  CL 98 99 97 112  CO2 29 29 27 23   BUN 31* 21 35* 48*  CREATININE 2.16* 1.74* 2.54* 2.80*  CALCIUM 10.7* 10.4 10.5 9.3  MG  --  2.2  --   --   PHOS 3.4  --  3.6 4.3  GLUCOSE 83 78 89 126*    CBG (last 3)   Recent Labs  08/31/12 0411 08/31/12 0550 08/31/12 0740  GLUCAP 52* 85 85    Scheduled Meds: . aspirin EC  81 mg Oral Daily  . chlorpheniramine-HYDROcodone  5 mL Oral Q12H  . cinacalcet  60 mg Oral Q breakfast  . colistimethate (COLISTIN) IV  1.5 mg/kg/day Intravenous Q24H  . [START ON 09/01/2012] darbepoetin (ARANESP) injection - DIALYSIS  200 mcg Intravenous Q Thu-HD  . docusate sodium  100 mg Oral BID  . feeding supplement (NEPRO CARB STEADY)  237 mL Oral TID WC & HS  . feeding supplement  30 mL Oral q morning - 10a  . ferric gluconate (FERRLECIT/NULECIT) IV  125 mg Intravenous Q T,Th,Sa-HD  . meloxicam  7.5 mg Oral Daily  . methadone  10 mg Oral QID  . metoprolol tartrate  6.2 mg Oral QHS  . multivitamin  1 tablet Oral QHS  . pantoprazole  40 mg Oral Daily  . sevelamer  400 mg  Oral TID WC  . simvastatin  10 mg Oral q1800  . sodium chloride  10-40 mL Intracatheter Q12H    Continuous Infusions:  none  Jarold Motto MS, RD, LDN Pager: [phone] After-hours pager: [phone]

## 2012-08-31 NOTE — Clinical Social Work Psychosocial (Signed)
Clinical Social Work Department BRIEF PSYCHOSOCIAL ASSESSMENT 08/31/2012  Patient:  MAKESHIA, SEAT     Account Number:  [id number]     Admit date:  08/13/2012  Clinical Social Worker:  Delmer Islam  Date/Time:  08/31/2012 04:24 AM  Referred by:  Physician  Date Referred:  08/29/2012 Referred for  SNF Placement   Other Referral:   Interview type:  Patient Other interview type:    PSYCHOSOCIAL DATA Living Status:  FACILITY Admitted from facility:  [facility] Level of care:  Skilled Nursing Facility Primary support name:   Primary support relationship to patient:   Degree of support available:   Patient has a son - [name], daughter Cletis Athens, sister Nils Flack and significant other.    CURRENT CONCERNS Current Concerns  Post-Acute Placement   Other Concerns:    SOCIAL WORK ASSESSMENT / PLAN CSW talked with patient about discharge plans and the possibility of going to a facility other than [facility]. Patient had indicated soon after admission that she did not want to return to this facility. Ms. Chirico stated that she begcame ill while staying there.    Patient mentioned that she might be going to [facility] on the 5th floor and CSW explained that a dialysis bed is not available at this tiem. CSW was agreeable to CSW sending out her information to determine if other facilities will respond. Ms. Leppla was also advised that if [facility] comes through with a bed before she has to choose a skilled facility, she can go there and patient nodded understanding. Patient was given SNF list for [facility].   Assessment/plan status:   Other assessment/ plan:   Information/referral to community resources:   SNF list for [facility]    PATIENT'S/FAMILY'S RESPONSE TO PLAN OF CARE: Ms. Azad was very pleasant and indicated that she remembered this CSW. Patient was sleepy, so CSW advised her that her clinical information will be sent out and CSW will  get back with her.

## 2012-08-31 NOTE — Progress Notes (Addendum)
[facility] PROGRESS NOTE  Brief narrative 62 year old female patient with multiple medical problems including ESRD on HD TTS, CAD status post CABG, chronic systolic CHF with last EF 10%, AICD, cirrhosis, chronic pancreatitis, cholecystectomy and cyst gastrostomy for pancreatic pseudocyst, hemorrhage into pseudocyst, infected pseudocyst status post aspiration by IR. She has refused surgery in the past for the pseudocyst. She now was admitted on 08/13/12 with recurrent infected pancreatic pseudocyst/abscesses.    Assessment/Plan: *Worsening leukocytosis and fevers due to abdominal abscess/infected pancreatic pseudocyst: - meropenem d/c as microorganism isolated is resistant to it. Patient afebrile. - ID consulted for antibiotics management. 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 -leukocytosis improving -patient stable; denies abdominal pain and better overall. -awaiting LTAC/SNF placement options per d/w CSW   Chronic systolic congestive heart failure, NYHA class 3 - no SOB, no further coughing spells'; Mild fluid overloaded. fine crackles, but no JVD. - Continue metoprolol but change to once a day given soft BP.  -volume to be manage with HD treatment. - No ACE inhibitor/ARB secondary to ESRD. Patient on low dose metoprolol.    Hyponatremia - resolved with HD -now Na 147. F/u at HD in am    ESRD on hemodialysis - Continue HD T/T/S    Anemia in chronic kidney disease (CKD) - Hemoglobin 7.2 on admission. The patient has received 2 units of PRBCs 08/14/2012.  - Hemoglobin dropped from 8.9>7.5 on 3/18. No overt bleeding. Will cont monitoring -no acute signs of bleeding. F/u CBC at HD in am. - Continue  Aranesp and IV iron per nephrology.    Pancreatitis, chronic - stable; lipase WNL -pseudocyst/abscess as mentioned above.    Hypertension - controlled. -continue metoprolol once a day  AMS/Delirium: -due to neurontin most likely -medication discontinued -patient mentation improved; but not back to baseline yet. ? What is baseline -CT head w/o acute abnormalities.  Severe protein calorie malnutrition -continue nepro and prostat -patient encourage to increase PO intake. Patient has been refusing meals. -diet change to regular to give more options and make easier intake.  Code Status: DNR  Family Communication: no family at bedside  Disposition Plan: to be detrmine; patient wants to go home; per PT she will benefit of SNF and if treatment with daily IV antibiotics is pursuit she might qualify for LTAC.    Consultants:  Nephrology  ID  Procedures: Echo 3.5.2014: The estimated ejection fraction was in the range of 20% to 25%, Peak RV-RA gradient: 41mm Hg (S). percutaneous drain by  IR 3.8.2014.   Antibiotics:  vanc zosyn 3.4.2014- 3.5.2014  Meropenem: 3/12-3/13   colestin 08/25/12  HPI/Subjective Denies abdominal pain. Shaking right hand  Objective: Filed Vitals:   08/30/12 1616 08/30/12 2044 08/31/12 0445 08/31/12 0930  BP: 118/74 122/78 120/79 121/81  Pulse: 92 101 99 105  Temp: 98 F (36.7 C) 99.9 F (37.7 C) 98.1 F (36.7 C) 98.5 F (36.9 C)  TempSrc: Oral Oral Axillary Oral  Resp: 18 18 18 18   Height:      Weight:  49.2 kg (108 lb 7.5 oz)    SpO2: 93% 95% 95% 96%    Intake/Output Summary (Last 24 hours) at 08/31/12 1401 Last data filed at 08/31/12 0832  Gross per 24  hour  Intake    450 ml  Output   1150 ml  Net   -700 ml   Filed Weights   08/30/12 1054 08/30/12 1513 08/30/12 2044  Weight: 49.4 kg (108 lb 14.5 oz) 48.2 kg (106 lb 4.2 oz) 49.2 kg (108 lb 7.5 oz)    Exam:  General: Alert, awake, oriented x2, in no acute distress. Sitting  comfortably on chair. Heart: Regular rate and rhythm, without murmurs, rubs, gallops. Telemetry shows sinus rhythm. Lungs: Good air movement, mild crackles bilaterally at bases. No increased work of breathing. Abdomen: Mildly distended but soft and without tenderness, rigidity or guarding. Normal bowel sounds heard. EXT: trace edema bilaterally CNS: Alert and oriented to person, place and partly to time. No focal neurological deficits.  Data Reviewed: Basic Metabolic Panel:  Recent Labs Lab 08/25/12 0645 08/26/12 0615 08/27/12 0900 08/30/12 1103  NA 134* 135 133* 147*  K 4.1 3.7 3.6 4.4  CL 98 99 97 112  CO2 29 29 27 23   GLUCOSE 83 78 89 126*  BUN 31* 21 35* 48*  CREATININE 2.16* 1.74* 2.54* 2.80*  CALCIUM 10.7* 10.4 10.5 9.3  MG  --  2.2  --   --   PHOS 3.4  --  3.6 4.3   Liver Function Tests:  Recent Labs Lab 08/25/12 0645 08/27/12 0900 08/30/12 1103  ALBUMIN 1.6* 1.5* 1.3*   CBC:  Recent Labs Lab 08/25/12 0646 08/26/12 0615 08/27/12 0859 08/30/12 1103  WBC 17.2* 12.5* 12.7* 11.5*  HGB 8.9* 9.4* 8.9* 7.5*  HCT 28.9* 30.0* 29.0* 24.3*  MCV 93.2 91.7 92.9 93.5  PLT 234 233 248 227   BNP (last 3 results)  Recent Labs  04/21/12 1419 05/08/12 0515 06/02/12 1944  PROBNP >70000.0* >70000.0* >70000.0*   CBG:  Recent Labs Lab 08/31/12 0002 08/31/12 0411 08/31/12 0550 08/31/12 0740 08/31/12 1149  GLUCAP 77 52* 85 85 87    Studies: No results found.  Scheduled Meds: . aspirin EC  81 mg Oral Daily  . chlorpheniramine-HYDROcodone  5 mL Oral Q12H  . cinacalcet  60 mg Oral Q breakfast  . colistimethate (COLISTIN) IV  1.5 mg/kg/day Intravenous Q24H  . [START ON 09/01/2012] darbepoetin (ARANESP) injection - DIALYSIS  200 mcg Intravenous Q Thu-HD  . docusate sodium  100 mg Oral BID  . feeding supplement (NEPRO CARB STEADY)  237 mL Oral TID WC & HS  . feeding supplement  30 mL Oral q morning - 10a  . ferric gluconate (FERRLECIT/NULECIT) IV  125 mg  Intravenous Q T,Th,Sa-HD  . meloxicam  7.5 mg Oral Daily  . methadone  10 mg Oral QID  . metoprolol tartrate  6.2 mg Oral QHS  . multivitamin  1 tablet Oral QHS  . pantoprazole  40 mg Oral Daily  . sevelamer  400 mg Oral TID WC  . simvastatin  10 mg Oral q1800  . sodium chloride  10-40 mL Intracatheter Q12H   Continuous Infusions:    [facility]  [facility] Pager [phone].  If 8PM-8AM, please contact night-coverage at [email or web address], password [facility] 08/31/2012, 2:01 PM  LOS: 18 days

## 2012-08-31 NOTE — Progress Notes (Signed)
Subjective:"  Something made me feel crazy recently", denies abdominal pain, wanting oatmeal for breaksfast Objective Vital signs in last 24 hours: Filed Vitals:   08/30/12 1513 08/30/12 1616 08/30/12 2044 08/31/12 0445  BP: 100/56 118/74 122/78 120/79  Pulse: 84 92 101 99  Temp: 96.6 F (35.9 C) 98 F (36.7 C) 99.9 F (37.7 C) 98.1 F (36.7 C)  TempSrc: Oral Oral Oral Axillary  Resp: 17 18 18 18   Height:      Weight: 48.2 kg (106 lb 4.2 oz)  49.2 kg (108 lb 7.5 oz)   SpO2: 100% 93% 95% 95%   Weight change: 2.402 kg (5 lb 4.7 oz)  Intake/Output Summary (Last 24 hours) at 08/31/12 6213 Last data filed at 08/30/12 1600  Gross per 24 hour  Intake    760 ml  Output   1150 ml  Net   -390 ml   Labs: Basic Metabolic Panel:  Recent Labs Lab 08/25/12 0645 08/26/12 0615 08/27/12 0900 08/30/12 1103  NA 134* 135 133* 147*  K 4.1 3.7 3.6 4.4  CL 98 99 97 112  CO2 29 29 27 23   GLUCOSE 83 78 89 126*  BUN 31* 21 35* 48*  CREATININE 2.16* 1.74* 2.54* 2.80*  CALCIUM 10.7* 10.4 10.5 9.3  PHOS 3.4  --  3.6 4.3   Liver Function Tests:  Recent Labs Lab 08/25/12 0645 08/27/12 0900 08/30/12 1103  ALBUMIN 1.6* 1.5* 1.3*   No results found for this basename: LIPASE, AMYLASE,  in the last 168 hours No results found for this basename: AMMONIA,  in the last 168 hours CBC:  Recent Labs Lab 08/24/12 1217 08/25/12 0646 08/26/12 0615 08/27/12 0859 08/30/12 1103  WBC 12.3* 17.2* 12.5* 12.7* 11.5*  HGB 9.2* 8.9* 9.4* 8.9* 7.5*  HCT 29.6* 28.9* 30.0* 29.0* 24.3*  MCV 92.2 93.2 91.7 92.9 93.5  PLT 207 234 233 248 227   Cardiac Enzymes: No results found for this basename: CKTOTAL, CKMB, CKMBINDEX, TROPONINI,  in the last 168 hours CBG:  Recent Labs Lab 08/30/12 1614 08/30/12 2043 08/31/12 0002 08/31/12 0411 08/31/12 0550  GLUCAP 80 69* 77 52* 85    Iron Studies: No results found for this basename: IRON, TIBC, TRANSFERRIN, FERRITIN,  in the last 72  hours Studies/Results: Ir US Guide Vasc Access Left  08/29/2012  *RADIOLOGY REPORT*  Clinical history:62 year old with end-stage renal disease and left subclavian cardiac ICD.  The patient has an infected pseudocyst and needs long-term IV antibiotics.  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  Request for a left arm midline PICC line.  PROCEDURE(S): PLACEMENT OF A LEFT ARM MIDLINE PICC LINE WITH ULTRASOUND AND FLUOROSCOPIC GUIDANCE  Physician: Rachelle Hora. Henn, MD  Medications:None  Moderate sedation time:None  Fluoroscopy time: 0.4 minutes  Contrast:  8 ml Omnipaque-300  Procedure:Informed consent was obtained for left arm PICC line. The left arm was with ultrasound.  The patient had a patent left brachial vein by ultrasound.  The left side arm was prepped and draped in a sterile fashion.  Maximal barrier sterile technique was utilized including caps, mask, sterile gowns, sterile gloves, sterile drape, hand hygiene and skin antiseptic.  The skin was anesthetized with lidocaine.  21 gauge needle was directed into the left brachial vein with ultrasound guidance.  Wire was advanced into the left axilla.  Wire would not advance centrally.  Left upper extremity venogram was performed.  A dual lumen Power PICC line was cut to 15 cm.  Catheter placed through the peel-away sheath and positioned in the left axilla.  Both lumens aspirated and flushed well.  Catheter was sutured to the skin.Fluoroscopic and ultrasound images were taken and saved for documentation.  Findings:Left subclavian ICD.  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  There are large collateral vessels in the left upper chest that drain into the left innominate vein.  Complications: None  Impression:Successful placement of a left midline PICC line. Catheter tip is in the left axilla.  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    Original Report Authenticated By: Richarda Overlie, M.D.    Ir Fluoro Guide Cv Midline Picc Left  08/29/2012  *RADIOLOGY REPORT*  Clinical history:62 year old with end-stage renal disease and left subclavian cardiac ICD.  The patient has an infected pseudocyst and needs long-term IV antibiotics.  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  Request for a left arm midline PICC line.  PROCEDURE(S): PLACEMENT OF A LEFT ARM MIDLINE PICC LINE WITH ULTRASOUND AND FLUOROSCOPIC GUIDANCE  Physician: Rachelle Hora. Henn, MD  Medications:None  Moderate sedation time:None  Fluoroscopy time: 0.4 minutes  Contrast:  8 ml Omnipaque-300  Procedure:Informed consent was obtained for left arm PICC line. The left arm was with ultrasound.  The patient had a patent left brachial vein by ultrasound.  The left side arm was prepped and draped in a sterile fashion.  Maximal barrier sterile technique was utilized including caps, mask, sterile gowns, sterile gloves, sterile drape, hand hygiene and skin antiseptic.  The skin was anesthetized with lidocaine.  21 gauge needle was directed into the left brachial vein with ultrasound guidance.  Wire was advanced into the left axilla.  Wire would not advance centrally.  Left upper extremity venogram was performed.  A dual lumen Power PICC line was cut to 15 cm.  Catheter placed through the peel-away sheath and positioned in the left axilla.  Both lumens aspirated and flushed well.  Catheter was sutured to the skin.Fluoroscopic and ultrasound images were taken and saved for documentation.  Findings:Left subclavian ICD.  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  There are large collateral vessels in the left upper chest that drain into the left innominate vein.  Complications: None  Impression:Successful placement of a left midline PICC line. Catheter tip is in the left axilla.  High-grade stenosis  or occlusion of the left subclavian vein.   Original Report Authenticated By: Richarda Overlie, M.D.    Medications:   . aspirin EC  81 mg Oral Daily  . chlorpheniramine-HYDROcodone  5 mL Oral Q12H  . cinacalcet  60 mg Oral Q breakfast  . colistimethate (COLISTIN) IV  1.5 mg/kg/day Intravenous Q24H  . darbepoetin (ARANESP) injection - DIALYSIS  100 mcg Intravenous Q Thu-HD  . docusate sodium  100 mg Oral BID  . feeding supplement (NEPRO CARB STEADY)  237 mL Oral TID WC & HS  . feeding supplement  30 mL Oral q morning - 10a  . ferric gluconate (FERRLECIT/NULECIT) IV  125 mg Intravenous Q T,Th,Sa-HD  . meloxicam  7.5 mg Oral Daily  . methadone  10 mg Oral QID  . metoprolol tartrate  6.2 mg Oral QHS  . multivitamin  1 tablet Oral QHS  . pantoprazole  40 mg Oral Daily  .  sevelamer  400 mg Oral TID WC  . simvastatin  10 mg Oral q1800  . sodium chloride  10-40 mL Intracatheter Q12H     Physical Exam: General: Alert, less lethargic than yesterday, NAD Heart: RRR, no rub, 1/6 sem lsb Lungs: CTA bilaterally Abdomen: bs pos. Soft nontender Extremities: Dialysis Access: trace pedal edema with improvement after hd yesterday/ right ij perm cath   Dialysis Orders: Center: [facility] on TTS.  EDW 53.5 kg HD Bath 3K/2.5Ca Time 4hrs Heparin 1600 U. Access Right IJ catheter BFR 400 DFR 800 Hectorol 0 mcg IV/HD Epogen 1500 Units IV/HD Venofer 0   Assessment/Plan:  1. Infected Pancreatic pseudocyst - [facility] -  Left mid forearm PIC Line in with  2 week course of colistin started and then to have repeat CT/ pain improved today/   2. ESRD - TTS HD usinf albumin for bp support and lowering of EDW pedal edema resolving now . Next hd in am. 3. Anemia - Hgb 8.9>9.4>8.9>7.5 variable, but overall stable s/p 2 units PRBC 3/02. Aranesp 100; repleteing iron / increase to max Aranesp 4. Secondary hyperparathyroidism - hypercalcemia improved some yesterday / phos stable with regular diet and renvela 1 ac/ also - can't  use 2 Ca bath due to low K, using 2.25; sensipar to 60 mg q day started last week,  5. HTN/volume - BP stable - metoprolol decreased to 6.25 q HS; EDW down 6 kg since admission  6. Protein calorie malnutrition - regular diet with protein supplement and nepro- alb 1.3 yesterday 7. Chronic systolic CHF, NYHA class 3 - per primary also severe MR from last echo done in Mar '14  8. Mental status changes - Head CT neg; neurontin stopped; afebrile today; tmax 99.9 yesterday 8. DNR -Palliative care following; planning LTAC for q day antibioitcs   Lenny Pastel, PA-C [facility] Beeper [phone] 08/31/2012,8:22 AM  LOS: 18 days   Agree with assessment and plan as articulated above.  Mental status improved and at baseline. Avianah Pellman C

## 2012-08-31 NOTE — Clinical Social Work Placement (Signed)
Clinical Social Work Department CLINICAL SOCIAL WORK PLACEMENT NOTE 08/31/2012  Patient:  LANIECE, HORNBAKER  Account Number:  [id number] Admit date:  08/13/2012  Clinical Social Worker:  Genelle Bal, LCSW  Date/time:  08/31/2012 04:39 AM  Clinical Social Work is seeking post-discharge placement for this patient at the following level of care:   SKILLED NURSING   (*CSW will update this form in Epic as items are completed)   08/31/2012  Patient/family provided with [facility] Department of Clinical Social Work's list of facilities offering this level of care within the geographic area requested by the patient (or if unable, by the patient's family).  08/31/2012  Patient/family informed of their freedom to choose among providers that offer the needed level of care, that participate in Medicare, Medicaid or managed care program needed by the patient, have an available bed and are willing to accept the patient.    Patient/family informed of [facility]' ownership interest in [facility], as well as of the fact that they are under no obligation to receive care at this facility.  PASARR submitted to [employer] on - Patient has a prior PASARR number PASARR number received from [employer] on   FL2 transmitted to all facilities in geographic area requested by pt/family on  08/31/2012 FL2 transmitted to all facilities within larger geographic area on   Patient informed that his/her managed care company has contracts with or will negotiate with  certain facilities, including the following:     Patient/family informed of bed offers received:   Patient chooses bed at  Physician recommends and patient chooses bed at    Patient to be transferred to  on   Patient to be transferred to facility by   The following physician request were entered in Epic:   Additional Comments:

## 2012-08-31 NOTE — Progress Notes (Signed)
Visited with pt who talked at some length about all she is going through.  Pt concerned that she could not remember what day it was when someone asked her.  Requested a piece of paper and started writing dates on it.  Pt thinks medication she took made her confused but states she is getting back to normal.  Rutherford Nail Chaplain

## 2012-08-31 NOTE — Progress Notes (Signed)
Patient AV:[id number] [name]      DOB: 1951/02/04      JYN:[id number]   Palliative Medicine Team at [facility] Progress Note    Subjective:Patient very lethargic, slow responsiveness, sentences trailing off. Patient stated that Mobic did seem to help pain. Per staff appetite extremely poor  Filed Vitals:   08/31/12 0930  BP: 121/81  Pulse: 105  Temp: 98.5 F (36.9 C)  Resp: 18   Physical exam: General: lethargic, slow to respond, sleepy HEENT: buccal mucosa moist CHEST: CTA CVS: RRR ABD: slightly tender with palpation ULQ NEURO: lethargic    Assessment and plan: Patient notably lethargic, discussed holding Methadone to see if mental stauts clears, patient in agreement.  1) Pain: LUQ pain, discussed with Dr Ladona Ridgel of PMT, she stated to discontinue Methadone, order Dilaudid 0.5 mg IV as needed, will recheck patients mental state tomorrow. If pain becomes uncontrolled with this dosing call PMT phone at [phone]   Time In Time Out Total Time Spent with Patient Total Overall Time  4:00p 4:30p 30 min 30 min   Freddie Breech, CNS-C Palliative Medicine Team [facility] Team Phone: [phone] Pager: [phone]

## 2012-09-01 DIAGNOSIS — R630 Anorexia: Secondary | ICD-10-CM

## 2012-09-01 DIAGNOSIS — R5381 Other malaise: Secondary | ICD-10-CM

## 2012-09-01 DIAGNOSIS — E162 Hypoglycemia, unspecified: Secondary | ICD-10-CM

## 2012-09-01 LAB — GLUCOSE, CAPILLARY
Glucose-Capillary: 52 mg/dL — ABNORMAL LOW (ref 70–99)
Glucose-Capillary: 64 mg/dL — ABNORMAL LOW (ref 70–99)
Glucose-Capillary: 68 mg/dL — ABNORMAL LOW (ref 70–99)
Glucose-Capillary: 79 mg/dL (ref 70–99)

## 2012-09-01 LAB — BASIC METABOLIC PANEL
BUN: 43 mg/dL — ABNORMAL HIGH (ref 6–23)
CO2: 30 mEq/L (ref 19–32)
Calcium: 11.4 mg/dL — ABNORMAL HIGH (ref 8.4–10.5)
Glucose, Bld: 80 mg/dL (ref 70–99)
Sodium: 141 mEq/L (ref 135–145)

## 2012-09-01 LAB — CBC
HCT: 25.3 % — ABNORMAL LOW (ref 36.0–46.0)
Hemoglobin: 7.8 g/dL — ABNORMAL LOW (ref 12.0–15.0)
MCH: 28.7 pg (ref 26.0–34.0)
MCV: 93 fL (ref 78.0–100.0)
RBC: 2.72 MIL/uL — ABNORMAL LOW (ref 3.87–5.11)

## 2012-09-01 MED ORDER — DEXTROSE 10 % IV SOLN
INTRAVENOUS | Status: DC
  Administered 2012-09-01: 15:00:00 via INTRAVENOUS
  Administered 2012-09-03: 30 mL/h via INTRAVENOUS
  Administered 2012-09-03: 10:00:00 via INTRAVENOUS
  Filled 2012-09-01: qty 1000

## 2012-09-01 MED ORDER — METHADONE HCL 5 MG PO TABS
5.0000 mg | ORAL_TABLET | Freq: Four times a day (QID) | ORAL | Status: DC
  Administered 2012-09-01 – 2012-09-03 (×8): 5 mg via ORAL
  Filled 2012-09-01 (×8): qty 1

## 2012-09-01 MED ORDER — DARBEPOETIN ALFA-POLYSORBATE 200 MCG/0.4ML IJ SOLN
INTRAMUSCULAR | Status: AC
  Administered 2012-09-01: 200 ug via INTRAVENOUS
  Filled 2012-09-01: qty 0.4

## 2012-09-01 NOTE — Progress Notes (Signed)
Patient [name]      DOB: 18-Sep-1950      WUJ:[id number]   Palliative Medicine Team at [facility] Progress Note    Subjective:Patient mental sluggish stills persists despite holding Methadone for past 24 hours, total dose of IV Dilaudid in past 24 hours 0.5 mg IV. Patient states pain is currently 10/10 diffuse over entire abdomen. Patient not eating Blood Sugars dropping <50, patient not eating.      Filed Vitals:   09/01/12 1251  BP: 118/77  Pulse: 85  Temp: 97.5 F (36.4 C)  Resp:    Physical exam:  General: Mentally cloudy, responds to questions appropriately, sentences trail off as she speaks. Cachectic, temperol wasting HEENT: mouth dry CHEST: CTA bilaterally CVS: RRR ABD: tender to palpation, BS audible NEURO: cognition sluggiash   Assessment and plan: Patient with altered cognition, responses sluggish, CT scan of Head 08/28/12 no acute changes, anemic today at 7.8/25.3  1) Code Status: DNR/DNI 2) Pain: no change in cognition holding Methadone, will restart at 1/2 original dosing: re-ordered 5 mg orally every 6 hours. Will continue Dilaudid IV 0.5 mg every 3 hours for BT pain. Discontinued Mobic due to patients poor intake. 3) Communication: Spoke with Dr Waymon Amato regarding mental status, agreeable for psych consult for evaluation of decision making capacity 4) Disposition: pending at current time, plan was for LTAC  Time In Time Out Total Time Spent with Patient Total Overall Time  1:oop 1:30p 30 min 30 min    [name], CNS-C Palliative Medicine Team [facility] Team Phone: [phone] Pager: [phone]

## 2012-09-01 NOTE — Care Management Note (Signed)
Bed available to [facility] for LTAC, however pt CBG very unstable with drop in CBG even after eating lunch. Per Dr Waymon Amato, will treat, and monitor for possible d/c to [facility] tomorrow if stable and if bed available.  Johny Shock RN MPH Case Manager [phone]

## 2012-09-01 NOTE — Progress Notes (Signed)
CBG: 43  Treatment: 15 GM carbohydrate snack and 15 GM gel  Symptoms: Pale and weak and mildly disoriented  Follow-up CBG: Time:1347 CBG Result:59  Repeated treatment: 15 GM carbohydrate snack and 15 GM gel  Follow-up CBG: Time:1516 CBG Result:79  Possible Reasons for Event: Inadequate meal intake  Comments/MD notified: Dr. Waymon Amato notified. Order given for 10% IV glucose fluid, to be run at 72ml/hr.  Will continue to encourage PO food intake for patient and will monitor closely.    [name]

## 2012-09-01 NOTE — Progress Notes (Signed)
Physical Therapy Treatment Patient Details Name: SABRE ROMBERGER MRN: [id number] DOB: 1951/01/26 Today's Date: 09/01/2012 Time: 1191-4782 PT Time Calculation (min): 25 min  PT Assessment / Plan / Recommendation Comments on Treatment Session  Pt appears to be weaker. She has not been able to participate in PT in several days.  She will benefit form conitinued PT attempts at SNF    Follow Up Recommendations  SNF     Does the patient have the potential to tolerate intense rehabilitation     Barriers to Discharge        Equipment Recommendations       Recommendations for Other Services    Frequency     Plan Discharge plan remains appropriate;Frequency remains appropriate    Precautions / Restrictions Precautions Precautions: Fall Precaution Comments: contact isolation Restrictions Weight Bearing Restrictions: No   Pertinent Vitals/Pain Pt c/o abdominal pain    Mobility  Bed Mobility Bed Mobility: Not assessed Details for Bed Mobility Assistance: Pt sitting on EOB she states she got there without assist Transfers Transfers: Sit to Stand;Stand to Sit Sit to Stand: With armrests;With upper extremity assist;From bed;4: Min assist Stand to Sit: With upper extremity assist;To chair/3-in-1;With armrests;4: Min assist Details for Transfer Assistance: pt needs assist to lift off from bed Ambulation/Gait Ambulation/Gait Assistance: 4: Min assist Ambulation Distance (Feet): 20 Feet Gait Pattern: Step-through pattern;Decreased stride length;Trunk flexed Gait velocity: decreased, but functional for in room walking General Gait Details: appears weaker Stairs: No Wheelchair Mobility Wheelchair Mobility: No    Exercises     PT Diagnosis:    PT Problem List:   PT Treatment Interventions:     PT Goals Acute Rehab PT Goals Pt will go Sit to Stand: with modified independence;with upper extremity assist PT Goal: Sit to Stand - Progress: Not progressing Pt will Stand: with  modified independence;with unilateral upper extremity support;3 - 5 min PT Goal: Stand - Progress: Not progressing Pt will Ambulate: >150 feet;with modified independence;with least restrictive assistive device PT Goal: Ambulate - Progress: Not progressing Pt will Perform Home Exercise Program: with supervision, verbal cues required/provided  Visit Information  Last PT Received On: 09/01/12 Assistance Needed: +1    Subjective Data  Subjective: I cn't believe this happened Patient Stated Goal: to sit in the chair   Cognition  Cognition Overall Cognitive Status: Difficult to assess Arousal/Alertness: Awake/alert Orientation Level: Disoriented to;Person;Place;Time Behavior During Session: [facility] for tasks performed    Balance  Balance Balance Assessed: Yes Static Sitting Balance Static Sitting - Balance Support: No upper extremity supported Static Sitting - Level of Assistance: 5: Stand by assistance Static Standing Balance Static Standing - Balance Support: Bilateral upper extremity supported Static Standing - Level of Assistance: 5: Stand by assistance Dynamic Standing Balance Dynamic Standing - Balance Support: Bilateral upper extremity supported Dynamic Standing - Level of Assistance: 5: Stand by assistance  End of Session PT - End of Session Equipment Utilized During Treatment: Oxygen Activity Tolerance: Patient limited by fatigue Patient left: in chair;with call bell/phone within reach Nurse Communication: Mobility status   GP   Bayard Hugger. Algood, [place] [phone] 09/01/2012, 5:12 PM

## 2012-09-01 NOTE — Progress Notes (Signed)
CBG: 47  Treatment: 15 GM gel  Symptoms: Sweaty  Follow-up CBG: Time: 0745 CBG Result: 78  Possible Reasons for Event: Inadequate meal intake  Comments/MD notified: Dayshift RN/Mackenzie and HD nurses made aware    [name]

## 2012-09-01 NOTE — Progress Notes (Addendum)
[facility] PROGRESS NOTE  Brief narrative 62 year old female patient with multiple medical problems including ESRD on HD TTS, CAD status post CABG, chronic systolic CHF with last EF 10%, AICD, cirrhosis, chronic pancreatitis, cholecystectomy and cyst gastrostomy for pancreatic pseudocyst, hemorrhage into pseudocyst, infected pseudocyst status post aspiration by IR. She has refused surgery in the past for the pseudocyst. She now was admitted on 08/13/12 with recurrent infected pancreatic pseudocyst/abscesses.    Assessment/Plan: Recurrent hypoglycemia ? Secondary to poor oral intake in the context of ESRD. Patient states poor appetite. Discussed with nephrology and will start low D10W slow rate and monitor CBGs closely.  Abdominal abscess/infected pancreatic pseudocyst: General surgery consulted and no intervention planned at this point. Patient was on meropenem which was discontinued because of resistance. Cultures from aspirated fluid showed KPC enterococcus cloacae. ID was consulted and recommended colistin with plan to treat for 2 weeks (day 8 of 14) and repeat CT at that time. Midline placed. Patient is afebrile and without leukocytosis. She however continues to complain of intermittent abdominal pain-managed by palliative care team.  Chronic systolic congestive heart failure, NYHA class 3 Volume management done across HD. Currently clinically compensated. No ACE inhibitor/ARB secondary to ESRD. Patient on low dose metoprolol.  Hyponatremia Resolved with HD.  ESRD on hemodialysis Continue HD T/T/S. Nephrology following.  Anemia in chronic kidney disease (CKD) Hemoglobin 7.2 on admission. The patient has received 2 units of PRBCs 08/14/2012. No overt bleeding. Continue Aranesp and IV iron per nephrology. Stable.  Pancreatitis, chronic Lipase normal. Abdominal pain.  Hypertension Controlled. Continue once a day metoprolol.   AMS/Delirium: Initially thought to be due to  Neurontin which was discontinued. Patient mentation improved; but not back to baseline yet. ? What is baseline CT head w/o acute abnormalities. Palliative care team recommend psychiatric consultation to determine capacity for decision making. Patient's children live out of state.  Severe protein calorie malnutrition continue nepro and prostat. Patient encouraged to increase PO intake. Patient has been refusing meals-no appetite. Diet change to regular to give more options and make easier intake.  Failure to thrive  Code Status: DNR  Family Communication: no family at bedside  Disposition Plan: to be detrmine; patient wants to go home; per PT she will benefit of SNF and if treatment with daily IV antibiotics is pursuit she might qualify for LTAC. [facility] was willing to accept on 3/20 but DC held secondary to recurrent severe hypoglycemia.   Consultants:  Nephrology  ID  Palliative Care Team  Procedures:  Echo 3.5.2014: The estimated ejection fraction was in the range of 20% to 25%, Peak RV-RA gradient: 41mm Hg (S).  percutaneous drain by  IR 3.8.2014.  Hemodialysis   Antibiotics:  vanc zosyn 3.4.2014- 3.5.2014  Meropenem: 3/12-3/13   colestin 08/25/12  HPI/Subjective Abdominal pain. Poor appetite. Despite eating blood sugars found to be low.  Objective: Filed Vitals:   09/01/12 1200 09/01/12 1211 09/01/12 1251 09/01/12 1450  BP: 111/72 117/69 118/77 118/80  Pulse: 77 78 85 98  Temp:  97.8 F (36.6 C) 97.5 F (36.4 C) 98 F (36.7 C)  TempSrc:  Axillary Axillary Axillary  Resp: 16 18  18   Height:      Weight:  47.3 kg (104 lb 4.4 oz)    SpO2:  92% 97% 98%    Intake/Output Summary (Last 24 hours) at 09/01/12 1519 Last data filed at 09/01/12 1330  Gross per 24 hour  Intake    540 ml  Output   1300 ml  Net   -760 ml   Filed Weights   08/31/12 2028 09/01/12 0751 09/01/12 1211  Weight: 49.201 kg (108 lb 7.5 oz) 48.5 kg (106 lb 14.8 oz) 47.3 kg (104 lb 4.4 oz)     Exam:  General: Patient seen at dialysis. Middle-aged female looking older than stated age. Frail and chronically ill-looking. Appears comfortable without distress. Heart: S1 and S2 heard, RRR. No JVD, murmurs or pedal edema. Lungs: Reduced breath sounds in bases but otherwise clear to auscultation.. No increased work of breathing. Abdomen: Mildly distended but soft and without tenderness, rigidity or guarding. Normal bowel sounds heard. EXT: Symmetric 5/5 power.  CNS: Alert and oriented to person, place and partly to time. No focal neurological deficits.  Data Reviewed: Basic Metabolic Panel:  Recent Labs Lab 08/26/12 0615 08/27/12 0900 08/30/12 1103 09/01/12 0802  NA 135 133* 147* 141  K 3.7 3.6 4.4 4.1  CL 99 97 112 104  CO2 29 27 23 30   GLUCOSE 78 89 126* 80  BUN 21 35* 48* 43*  CREATININE 1.74* 2.54* 2.80* 2.77*  CALCIUM 10.4 10.5 9.3 11.4*  MG 2.2  --   --   --   PHOS  --  3.6 4.3  --    Liver Function Tests:  Recent Labs Lab 08/27/12 0900 08/30/12 1103  ALBUMIN 1.5* 1.3*   CBC:  Recent Labs Lab 08/26/12 0615 08/27/12 0859 08/30/12 1103 09/01/12 0802  WBC 12.5* 12.7* 11.5* 9.6  HGB 9.4* 8.9* 7.5* 7.8*  HCT 30.0* 29.0* 24.3* 25.3*  MCV 91.7 92.9 93.5 93.0  PLT 233 248 227 223   BNP (last 3 results)  Recent Labs  04/21/12 1419 05/08/12 0515 06/02/12 1944  PROBNP >70000.0* >70000.0* >70000.0*   CBG:  Recent Labs Lab 09/01/12 0739 09/01/12 0900 09/01/12 1254 09/01/12 1256 09/01/12 1347  GLUCAP 76 100* 40* 43* 52*    Studies: No results found.  Scheduled Meds: . aspirin EC  81 mg Oral Daily  . chlorpheniramine-HYDROcodone  5 mL Oral Q12H  . cinacalcet  60 mg Oral Q breakfast  . colistimethate (COLISTIN) IV  1.5 mg/kg/day Intravenous Q24H  . darbepoetin (ARANESP) injection - DIALYSIS  200 mcg Intravenous Q Thu-HD  . docusate sodium  100 mg Oral BID  . feeding supplement (NEPRO CARB STEADY)  237 mL Oral TID WC & HS  . feeding  supplement  30 mL Oral q morning - 10a  . ferric gluconate (FERRLECIT/NULECIT) IV  125 mg Intravenous Q T,Th,Sa-HD  . methadone  5 mg Oral Q6H  . metoprolol tartrate  6.2 mg Oral QHS  . multivitamin  1 tablet Oral QHS  . pantoprazole  40 mg Oral Daily  . sevelamer  400 mg Oral TID WC  . simvastatin  10 mg Oral q1800  . sodium chloride  10-40 mL Intracatheter Q12H   Continuous Infusions: . dextrose       Ariabella Brien  [facility] Pager [phone].  If 8PM-8AM, please contact night-coverage at [email or web address], password [facility] 09/01/2012, 3:19 PM  LOS: 19 days

## 2012-09-01 NOTE — Procedures (Signed)
ON hemodialysis.  She has recurrent hypoglycemia.  Malnourished and cachectic appearance. Per primary team. Lauris Poag

## 2012-09-01 NOTE — Progress Notes (Signed)
Palliative Medicine Team SW Present for follow up visit with PMT NP Jannette Fogo. Pt continues to have slow responsiveness, although slightly more able to answer questions. Pt continues to refuse food. PMT attempted to gauge pt's desires for continuing in current path of care, but pt unable to really follow along other than to say, "I have to have dialysis for the rest of my life; it's my lifeline". [name], the dtr of pt's close female friend, arrived at end of visit and endorsed pt's altered mentation from baseline. [name] was updated on pt's current conditions and concerns about her care. [name] reports need to reach out to and inform pt's immediate family (son & dtr out of state). PMT NP to follow up with primary team and family as appropriate. Available as needed for continued emotional support & help with decision-making.   [name], LCSWA PMT phone [phone] Pager [phone]

## 2012-09-01 NOTE — Progress Notes (Signed)
CBG: 68  Treatment: 15 GM carbohydrate snack  Symptoms: None  Follow-up CBG: Time: 0105 CBG Result: 64 (given Glucose gel at this time)  Possible Reasons for Event: Inadequate meal intake  Comments/MD notified: n/a    [name]

## 2012-09-02 DIAGNOSIS — R404 Transient alteration of awareness: Secondary | ICD-10-CM

## 2012-09-02 LAB — GLUCOSE, CAPILLARY
Glucose-Capillary: 81 mg/dL (ref 70–99)
Glucose-Capillary: 71 mg/dL (ref 70–99)
Glucose-Capillary: 102 mg/dL — ABNORMAL HIGH (ref 70–99)
Glucose-Capillary: 84 mg/dL (ref 70–99)
Glucose-Capillary: 139 mg/dL — ABNORMAL HIGH (ref 70–99)

## 2012-09-02 MED ORDER — DIPHENHYDRAMINE HCL 50 MG/ML IJ SOLN: 25.0000 mg | Freq: Four times a day (QID) | INTRAMUSCULAR | Status: DC | PRN

## 2012-09-02 MED ORDER — MEGESTROL ACETATE 400 MG/10ML PO SUSP
800.0000 mg | Freq: Every day | ORAL | Status: DC
  Filled 2012-09-02: qty 20

## 2012-09-02 MED ORDER — SALINE SPRAY 0.65 % NA SOLN
1.0000 | NASAL | Status: DC | PRN
  Filled 2012-09-02: qty 44

## 2012-09-02 MED ORDER — HYDROMORPHONE HCL 2 MG PO TABS
2.0000 mg | ORAL_TABLET | Freq: Four times a day (QID) | ORAL | Status: DC | PRN
  Administered 2012-09-02 – 2012-09-03 (×4): 2 mg via ORAL
  Filled 2012-09-02 (×4): qty 1

## 2012-09-02 MED ORDER — MEGESTROL ACETATE 40 MG/ML PO SUSP
800.0000 mg | Freq: Every day | ORAL | Status: DC
  Administered 2012-09-02 – 2012-09-03 (×2): 800 mg via ORAL
  Filled 2012-09-02 (×2): qty 20

## 2012-09-02 NOTE — Progress Notes (Addendum)
[facility] PROGRESS NOTE  Brief narrative 62 year old female patient with multiple medical problems including ESRD on HD TTS, CAD status post CABG, chronic systolic CHF with last EF 10%, AICD, cirrhosis, chronic pancreatitis, cholecystectomy and cyst gastrostomy for pancreatic pseudocyst, hemorrhage into pseudocyst, infected pseudocyst status post aspiration by IR. She has refused surgery in the past for the pseudocyst. She now was admitted on 08/13/12 with recurrent infected pancreatic pseudocyst/abscesses.    Assessment/Plan: Recurrent hypoglycemia ? Secondary to poor oral intake in the context of ESRD. Patient states poor appetite, but improving. Discussed with nephrology and started low D10W slow rate- no further hypoglycemia.  Abdominal abscess/infected pancreatic pseudocyst: General surgery consulted and no intervention planned at this point. Patient was on meropenem which was discontinued because of resistance. Cultures from aspirated fluid showed KPC enterococcus cloacae. ID was consulted and recommended Colistin with plan to treat for 2 weeks (day 9 of 14) and repeat CT at that time. Midline placed. Patient is afebrile and without leukocytosis. She however continues to complain of intermittent abdominal pain-managed by palliative care team. Pain better controlled.  Chronic systolic congestive heart failure, NYHA class 3 Volume management done across HD. Currently clinically compensated. No ACE inhibitor/ARB secondary to ESRD. Patient on low dose metoprolol.  Hyponatremia Resolved with HD.  ESRD on hemodialysis Continue HD T/T/S. Nephrology following.  Anemia in chronic kidney disease (CKD) Hemoglobin 7.2 on admission. The patient has received 2 units of PRBCs 08/14/2012. No overt bleeding. Continue Aranesp and IV iron per nephrology. Stable. F/u CBC at HD in am.  Pancreatitis, chronic Lipase normal. Abdominal pain.  Hypertension Controlled. Continue once a day  metoprolol.   AMS/Delirium: Initially thought to be due to Neurontin which was discontinued. Patient mentation improved; but not back to baseline yet. ? What is baseline CT head w/o acute abnormalities. Palliative care team recommend psychiatric consultation to determine capacity for decision making. Patient's children live out of state.  Severe protein calorie malnutrition continue nepro and prostat. Patient encouraged to increase PO intake. Patient has been refusing meals-no appetite. Diet change to regular to give more options and make easier intake.  Failure to thrive  Code Status: DNR  Family Communication: no family at bedside  Disposition Plan: to be detrmined; patient wants to go home; per PT she will benefit of SNF and if treatment with daily IV antibiotics is pursuit she might qualify for LTAC. [facility] was willing to accept on 3/20 but DC held secondary to recurrent severe hypoglycemia.   Consultants:  Nephrology  ID  Palliative Care Team  Procedures:  Echo 3.5.2014: The estimated ejection fraction was in the range of 20% to 25%, Peak RV-RA gradient: 41mm Hg (S).  percutaneous drain by  IR 3.8.2014.  Hemodialysis   Antibiotics:  vanc zosyn 3.4.2014- 3.5.2014  Meropenem: 3/12-3/13   colestin 08/25/12  HPI/Subjective Abdominal pain, left sided- better. Poor appetite, but eating better. Had dark green BM 3/21.  Objective: Filed Vitals:   09/01/12 1450 09/01/12 1856 09/01/12 2022 09/02/12 0558  BP: 118/80 112/74 124/85 113/79  Pulse: 98 94 94 87  Temp: 98 F (36.7 C) 98 F (36.7 C) 98.7 F (37.1 C) 98 F (36.7 C)  TempSrc: Axillary Oral Oral Oral  Resp: 18 18 18 19   Height:   5\' 4"  (1.626 m)   Weight:   47.299 kg (104 lb 4.4 oz)   SpO2: 98% 96% 100% 97%    Intake/Output Summary (Last 24 hours) at 09/02/12 0913 Last data filed at 09/01/12 1856  Gross per 24 hour  Intake    700 ml  Output   1300 ml  Net   -600 ml   Filed Weights   09/01/12 0751  09/01/12 1211 09/01/12 2022  Weight: 48.5 kg (106 lb 14.8 oz) 47.3 kg (104 lb 4.4 oz) 47.299 kg (104 lb 4.4 oz)    Exam:  General: Middle-aged female looking older than stated age. Frail and chronically ill-looking. Appears comfortable without distress. Heart: S1 and S2 heard, RRR. No JVD, murmurs or pedal edema. Lungs: Reduced breath sounds in bases but otherwise clear to auscultation.. No increased work of breathing. Abdomen: Mildly distended but soft. Mild tenderness LLQ & LMQ without rigidity, rebound or guarding. Normal bowel sounds heard. EXT: Symmetric 5/5 power.  CNS: Alert and oriented to person, place and partly to time. No focal neurological deficits.  Data Reviewed: Basic Metabolic Panel:  Recent Labs Lab 08/27/12 0900 08/30/12 1103 09/01/12 0802  NA 133* 147* 141  K 3.6 4.4 4.1  CL 97 112 104  CO2 27 23 30   GLUCOSE 89 126* 80  BUN 35* 48* 43*  CREATININE 2.54* 2.80* 2.77*  CALCIUM 10.5 9.3 11.4*  PHOS 3.6 4.3  --    Liver Function Tests:  Recent Labs Lab 08/27/12 0900 08/30/12 1103  ALBUMIN 1.5* 1.3*   CBC:  Recent Labs Lab 08/27/12 0859 08/30/12 1103 09/01/12 0802  WBC 12.7* 11.5* 9.6  HGB 8.9* 7.5* 7.8*  HCT 29.0* 24.3* 25.3*  MCV 92.9 93.5 93.0  PLT 248 227 223   BNP (last 3 results)  Recent Labs  04/21/12 1419 05/08/12 0515 06/02/12 1944  PROBNP >70000.0* >70000.0* >70000.0*   CBG:  Recent Labs Lab 09/01/12 2027 09/02/12 0040 09/02/12 0403 09/02/12 0803 09/02/12 0856  GLUCAP 102* 113* 81 71 102*    Studies: No results found.  Scheduled Meds: . aspirin EC  81 mg Oral Daily  . chlorpheniramine-HYDROcodone  5 mL Oral Q12H  . cinacalcet  60 mg Oral Q breakfast  . colistimethate (COLISTIN) IV  1.5 mg/kg/day Intravenous Q24H  . darbepoetin (ARANESP) injection - DIALYSIS  200 mcg Intravenous Q Thu-HD  . docusate sodium  100 mg Oral BID  . feeding supplement (NEPRO CARB STEADY)  237 mL Oral TID WC & HS  . feeding supplement   30 mL Oral q morning - 10a  . ferric gluconate (FERRLECIT/NULECIT) IV  125 mg Intravenous Q T,Th,Sa-HD  . methadone  5 mg Oral Q6H  . metoprolol tartrate  6.2 mg Oral QHS  . multivitamin  1 tablet Oral QHS  . pantoprazole  40 mg Oral Daily  . sevelamer  400 mg Oral TID WC  . simvastatin  10 mg Oral q1800  . sodium chloride  10-40 mL Intracatheter Q12H   Continuous Infusions: . dextrose 20 mL/hr at 09/01/12 1529     [facility]  [facility] Pager [phone].  If 8PM-8AM, please contact night-coverage at [email or web address], password [facility] 09/02/2012, 9:13 AM  LOS: 20 days
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
## 2012-09-02 NOTE — Progress Notes (Signed)
Subjective: No current complaints, no nausea, vomiting, or abdominal pain,  Objective: Vital signs in last 24 hours: Temp:  [97.4 F (36.3 C)-98.7 F (37.1 C)] 98 F (36.7 C) (03/21 0558) Pulse Rate:  [75-98] 87 (03/21 0558) Resp:  [14-21] 19 (03/21 0558) BP: (101-127)/(67-85) 113/79 mmHg (03/21 0558) SpO2:  [92 %-100 %] 97 % (03/21 0558) Weight:  [47.299 kg (104 lb 4.4 oz)-48.5 kg (106 lb 14.8 oz)] 47.299 kg (104 lb 4.4 oz) (03/20 2022) Weight change: -0.701 kg (-1 lb 8.7 oz)  Intake/Output from previous day: 03/20 0701 - 03/21 0700 In: 940 [P.O.:940] Out: 1300    EXAM: General appearance:  Alert, cachetic, in no apparent distress Resp:  CTA without rales, rhonchi, or wheezes Cardio:  RRR with Gr I/VI systolic murmur, no rub GI:  + BS, soft and nontender Extremities:  1+ LE edema Access: Right IJ catheter  Lab Results:  Recent Labs  08/30/12 1103 09/01/12 0802  WBC 11.5* 9.6  HGB 7.5* 7.8*  HCT 24.3* 25.3*  PLT 227 223   BMET:  Recent Labs  08/30/12 1103 09/01/12 0802  NA 147* 141  K 4.4 4.1  CL 112 104  CO2 23 30  GLUCOSE 126* 80  BUN 48* 43*  CREATININE 2.80* 2.77*  CALCIUM 9.3 11.4*  ALBUMIN 1.3*  --    No results found for this basename: PTH,  in the last 72 hours Iron Studies: No results found for this basename: IRON, TIBC, TRANSFERRIN, FERRITIN,  in the last 72 hours  Dialysis Orders: Center: [facility] on TTS.  EDW 53.5 kg HD Bath 3K/2.5Ca Time 4hrs Heparin 1600 U. Access Right IJ catheter BFR 400 DFR 800 Hectorol 0 mcg IV/HD Epogen 1500 Units IV/HD Venofer 0   Assessment/Plan: 1. Infected pancreatic pseudocyst - Enterobacter cloacae per aspiration of abscess 3/8, blood cultures negative; on IV Colymycin, improving. 2. ESRD HD on TTS @ [facility]; last K 4.1 using 2K bath.  HD tomorrow. 3. Anemia - Hgb up to 7.8, s/p 2 units PRBCs 3/2; Aranesp 200 mcg on Thurs. 4. Hypotension/Volume - BP most recently 113/79, Metoprolol decreased to 6.25 mg qd; IV albumin per  HD; wt down to 47.3 kg. 5. Secondary hyperparathyroidism - Ca 11.4 (13.6 corrected), P 4.3, using 4K/2.25Ca bath to maintain K, Sensipar 60 mg qd, Renvela 400 mg with meals. 6. Protein calorie malnutrition - Alb 1.3, regular diet with protein supplement and Nepro. 7. Chronic systolic CHF - dilated LV with severe systolic dysfunction, severe MR, EF 20-25% per echo 3/5. 8. AMS - improving after Neurontin was dc'd. 9. DNR - followed by Palliative Care, LTAC for daily antibiotics.     LOS: 20 days   [name],Dorothy 09/02/2012,6:45 AM  As above agree with evaluation and plan as articulated.  She has agreed to go to [facility] after dialysis tomorrow.  This is a good plan for her as she wants to go home.  She has a lot of improvement to do. [name] C

## 2012-09-02 NOTE — Progress Notes (Signed)
This CSW met with patient who  Was oriented to time,place, person- she was able to tell me she has been staying at [facility] and does not wish to go back- she did not elaborate on this with me other than to say she got pneumonia and did not like it there. Dr. Ferol Luz to see patient for capacity- will follow to assist as needed-    Reece Levy, MSW, [employer] [phone]

## 2012-09-02 NOTE — Progress Notes (Signed)
[facility]LT[facility] has bed available on 09/03/2012  Patient to discharge on 09/03/2012. Charge Nurse N. Bindhammer RN aware.

## 2012-09-02 NOTE — Progress Notes (Addendum)
Patient [name]      DOB: Dec 31, 1950      WUJ:[id number]   Palliative Medicine Team at [facility] Progress Note    Subjective:Patient slightly more alert today, pain in abdomen 4/10 which is tolerable, started Methadone back at 1/2 dose , with Dilaudid IV as needed every, total dose/5 mg /24 hrs     Filed Vitals:   09/02/12 0558  BP: 113/79  Pulse: 87  Temp: 98 F (36.7 C)  Resp: 19   Physical exam: General:increased verbal response, still remains sluggish overall HEENT: buccal mucosa moist CHEST: CTA, diminished at bases CVS: RRR ABD: soft non-tender, BS audible (last BM 3/20), LUQ mild pain with palpation EXT: no pedal edema NEURO: verbal responses slow/sluggish   Assessment and plan: Patients overall mental status has slightly cleared since decreasing Methadone dose earlier this week. Plan to have Psych evaluate decision making capacity. Wants to continue HD at this time. With permission spoke with patients daughter [name] ([phone]) infomed her of mothers current medical issues and limited oral intake with subsequent low BS. She stated mother benefited from appetite stimulant in past.  1) Code Status: DNR/DNI 2) Pain/Abdominal: on Methadone 5 mg every 6 hours scheduled orally, with Diluadid 0.5 mg every 3 hours as needed, will transition to oral Dilaudid 2 mg orally every 6 hours as needed. Remote issues with itching/SOB with oral Dilaudid, has tolerated  will order IV doses. Benadryl IV order if reaction occurs 3)Weakness/AMS: patient anemic at 7.8, may benefit from PRBCs 4) Poor Appetite: ordered Megace 800 mg daily 5) Disposition pending plan is for discharge to LTAC  Time In Time Out Total Time Spent with Patient Total Overall Time  8:00a 8:30a 30 min 30 min   Freddie Breech, CNS-C Palliative Medicine Team [facility] Health Team Phone: [phone] Pager: [phone]

## 2012-09-02 NOTE — Consult Note (Signed)
Patient Identification:  [name] Date of Evaluation:  09/02/2012 Reason for Consult:  Capacity  Referring Provider: Dr. Waymon Amato  History of Present Illness:pt has a complicated medical condition: 08/13/12 with recurrent infected pancreatic pseudocyst/abscesses.  She was found with new pleural effusion and shortness of breath.  Pt claims "neurontin did me in".  Past Psychiatric History:The impact of her medical conditions that led to anemia, pseudocyst in pancrease and ESRD resulted in renal problems and  hypoxia She was more likely to have depression related to delirium with consequences of medical problems causing her mental behaviors.  Past Medical History:     Past Medical History  Diagnosis Date  . Coronary artery disease     s/p CABG 2008 with multiple PCIs  . Ischemic cardiomyopathy     Cath 04/2012, significant calcifications  . History of nonadherence to medical treatment   . Systolic CHF     16/1096 echo EF 10%  . Esophageal varices   . Cirrhosis     hx of fatty liver per patient, has known ascites with repeated paracenteses at [facility] as of 2013, also +hx of esoph varices with banding in the past  . Pacemaker   . ICD (implantable cardiac defibrillator) in place   . Hypertension     "used to have HTN; now I'm low" (05/23/2012)  . NSTEMI (non-ST elevated myocardial infarction)     04/2012; Trop peaked to 1.39; 05/23/2012 pt denies ever having MI  . Pneumonia 02/2012    "first time ever" (05/23/2012)  . Chronic bronchitis 1970's thru 2002    "went away when I stopped smoking in 2002" (05/23/2012)  . Diabetes mellitus     Diet controlled  . History of blood transfusion     "alot of them" (05/23/2012)  . Iron (Fe) deficiency anemia     "severe" (05/23/2012)  . Pancreatitis, chronic 10/04/2011    Had severe gallstone pancreatitis in May 2003, no surgery, treated medically then returned in June 2003 for cholecystectomy and cyst gastrostomy for pancreatic pseudocyst. In March  2013 was admitted for hemorrhage into psueodcyst. In April 2013 was treated at [facility] for polymicrobial bacteremia (fungal, MRSA and enterobacter) felt to be due to ruptured pseudocyst, treated medically. Admitted Jan 2014 with abd pain and had gas and fluid in pancreatic bed. She refused surgery ("would not make it") and had fluid aspirated by IR- gram stain showed yeast and cultures grew enterococcus species, sensitive to amp and vanc. She was seen by ID and discharged on IV vanc/fortaz with HD and po voriconazole.     [name] ESRD on hemodialysis 05/06/2012    T,Th,Sat HD by [facility] on [address]. Started hemodialysis around July 2013.    [name] CHF (congestive heart failure)   . Complication of vascular access for dialysis 07/13/2012    L arm AVG (placed by Dr. Lyda Perone @ [facility]) was ligated 1/6 after significant L arm swelling secondary to L SCV stenosis and pacemaker; new access to be placed at right arm on 1/29 by Dr. Hollace Hayward         Past Surgical History  Procedure Laterality Date  . Esophagogastroduodenoscopy  09/15/2011    Procedure: ESOPHAGOGASTRODUODENOSCOPY (EGD);  Surgeon: Theda Belfast, MD;  Location: [facility] ENDOSCOPY;  Service: Endoscopy;  Laterality: N/A;  . Cholecystectomy  2003  . Tonsillectomy and adenoidectomy  1961  . Appendectomy  1973?  [name] Vaginal hysterectomy  1973?  [name] Tubal ligation  1972  . Dilation and curettage of  uterus      "bunch from profuse bleeding in the 1970's" (05/23/2012)  . Coronary artery bypass graft  2008    CABG X4  . Coronary angioplasty with stent placement  2008    "1; day after CABG" (05/23/2012)  . Cardiac catheterization      "before 2008 and 3 wk ago" (05/23/2012)  . Insert / replace / remove pacemaker  06/2011    pacemaker ICD  . Cardiac defibrillator placement  06/2011  . Arteriovenous graft placement  03/2012    right antecub  . Insertion of dialysis catheter  ~ 10/2011    right chest  . Peritoneal catheter insertion  08/2011  .  Peritoneal catheter removal  ~ 10/2011    Allergies:  Allergies  Allergen Reactions  . Codeine Itching  . Morphine And Related Shortness Of Breath    SOB when given IV once "to me it was mild; I called out fast for help; never had to intubate" (05/23/2012)  . Ace Inhibitors Other (See Comments)    Unknown reaction but her physician stated that she can't take it (specifically lisinopril)  . Tylenol (Acetaminophen) Other (See Comments)    Patient states that the doctor told her she has a Fatty Liver.    Current Medications:  Prior to Admission medications   Medication Sig Start Date End Date Taking? Authorizing Provider  aspirin EC 81 MG EC tablet Take 1 tablet (81 mg total) by mouth daily. 05/11/12  Yes Larey Seat, MD  HYDROmorphone (DILAUDID) 2 MG tablet Take 1 tablet (2 mg total) by mouth every 6 (six) hours as needed for pain. For pain 07/20/12  Yes Rhetta Mura, MD  methadone (DOLOPHINE) 10 MG tablet Take 1 tablet (10 mg total) by mouth 4 (four) times daily. 07/20/12  Yes Rhetta Mura, MD  multivitamin (RENA-VIT) TABS tablet Take 1 tablet by mouth daily. 10/14/11  Yes Clanford Cyndie Mull, MD  Nutritional Supplements (FEEDING SUPPLEMENT, NEPRO CARB STEADY,) LIQD Take 237 mLs by mouth 2 (two) times daily with breakfast and lunch. 03/04/12  Yes Srikar Cherlynn Kaiser, MD  pantoprazole (PROTONIX) 40 MG tablet Take 40 mg by mouth daily.   Yes Historical Provider, MD  pravastatin (PRAVACHOL) 20 MG tablet Take 20 mg by mouth at bedtime.    Yes Historical Provider, MD  ranolazine (RANEXA) 500 MG 12 hr tablet Take 500 mg by mouth daily.    Yes Historical Provider, MD  sevelamer (RENAGEL) 400 MG tablet Take 400 mg by mouth daily. With a meal   Yes Historical Provider, MD    Social History:    reports that she quit smoking about 12 years ago. Her smoking use included Cigarettes. She has a 10.23 pack-year smoking history. She has never used smokeless tobacco. She reports that she does not drink  alcohol or use illicit drugs.   Family History:    Family History  Problem Relation Age of Onset  . Coronary artery disease Mother   . Hypertension Mother   . Diabetes Mother   . Diabetes Sister   . Anesthesia problems Neg Hx     Mental Status Examination/Evaluation: Objective:  Appearance: Casual, Fairly Groomed and cachectic  Eye Contact::  Good  Speech:  Clear and Coherent and Normal Rate  Volume:  Normal  Mood:  euthymic  Affect:  Appropriate  Thought Process:  Coherent, Goal Directed and Logical  Orientation:  Full (Time, Place, and Person)  Thought Content:  realistic and goal oriented  Suicidal Thoughts:  No  Homicidal Thoughts:  No  Judgement:  Good  Insight:  Fair   DIAGNOSIS:    AXIS I   Delirium associated with complex medical condition          AXIS II  Deferred  AXIS III See medical notes.  AXIS IV educational problems, housing problems, other psychosocial or environmental problems, problems related to social environment and Pt is basically alone  AXIS V 41-50 serious symptoms   Assessment/Plan:  Discussed with Dr. Waymon Amato, Harriett Sine RN Pt is alert, easily engages in conversation with logical train fo thought.  She explains her pseudocyst in simple language.  She knows she is going to long term SNF without any objection.    She engages in MMSE;  She states the date.  She recalls 2 of 3 objects in 5 minutes.  She interprets proverb with abstract reasoning.  She calculates serial 3s with ease and proceeds 17-14-11-8-5-2  Without hesitation She uses functional judgment for simple problem. She denies suicidal thoughts, auditory or visual hallucinations.  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  She indicates she looks forward to seeing her friends.  Sylvie has expressed no reservation about going there. She knows her treatment needs to continue.  RECOMMENDATION:  1.  Pt has capacity and agrees to go to Long term  SNF 2.  Continue treatment.  3.  No further psychiatric care needed, MD Psychiatrist signs off.  Mickeal Skinner MD 09/02/2012 7:28 PM

## 2012-09-03 LAB — RENAL FUNCTION PANEL
BUN: 37 mg/dL — ABNORMAL HIGH (ref 6–23)
CO2: 28 mEq/L (ref 19–32)
Chloride: 98 mEq/L (ref 96–112)
Glucose, Bld: 137 mg/dL — ABNORMAL HIGH (ref 70–99)
Potassium: 3.4 mEq/L — ABNORMAL LOW (ref 3.5–5.1)

## 2012-09-03 LAB — GLUCOSE, CAPILLARY
Glucose-Capillary: 90 mg/dL (ref 70–99)
Glucose-Capillary: 161 mg/dL — ABNORMAL HIGH (ref 70–99)
Glucose-Capillary: 57 mg/dL — ABNORMAL LOW (ref 70–99)
Glucose-Capillary: 153 mg/dL — ABNORMAL HIGH (ref 70–99)

## 2012-09-03 LAB — CBC
HCT: 25.2 % — ABNORMAL LOW (ref 36.0–46.0)
Hemoglobin: 7.9 g/dL — ABNORMAL LOW (ref 12.0–15.0)
MCV: 94 fL (ref 78.0–100.0)
WBC: 10.3 10*3/uL (ref 4.0–10.5)

## 2012-09-03 MED ORDER — SODIUM CHLORIDE 0.9 % IV SOLN: INTRAVENOUS | Status: DC

## 2012-09-03 MED ORDER — DEXTROSE 10 % IV SOLN: 30.0000 mL/h | INTRAVENOUS | Status: DC

## 2012-09-03 MED ORDER — HEPARIN SODIUM (PORCINE) 1000 UNIT/ML DIALYSIS: 1000.0000 [IU] | INTRAMUSCULAR | Status: DC | PRN

## 2012-09-03 MED ORDER — LIDOCAINE HCL (PF) 1 % IJ SOLN: 5.0000 mL | INTRAMUSCULAR | Status: DC | PRN

## 2012-09-03 MED ORDER — DEXTROSE 50 % IV SOLN: 25.0000 mL | Freq: Once | INTRAVENOUS | Status: AC

## 2012-09-03 MED ORDER — DSS 100 MG PO CAPS: 100.0000 mg | ORAL_CAPSULE | Freq: Two times a day (BID) | ORAL | Status: AC

## 2012-09-03 MED ORDER — HYDROMORPHONE HCL 2 MG PO TABS: 2.0000 mg | ORAL_TABLET | Freq: Four times a day (QID) | ORAL | Status: DC | PRN

## 2012-09-03 MED ORDER — NEPRO/CARBSTEADY PO LIQD: 237.0000 mL | ORAL | Status: DC | PRN

## 2012-09-03 MED ORDER — DEXTROSE 50 % IV SOLN
INTRAVENOUS | Status: AC
  Administered 2012-09-03: 25 mL
  Filled 2012-09-03: qty 50

## 2012-09-03 MED ORDER — MEGESTROL ACETATE 40 MG/ML PO SUSP: 800.0000 mg | Freq: Every day | ORAL | Status: DC

## 2012-09-03 MED ORDER — LIDOCAINE-PRILOCAINE 2.5-2.5 % EX CREA: 1.0000 "application " | TOPICAL_CREAM | CUTANEOUS | Status: DC | PRN

## 2012-09-03 MED ORDER — HEPARIN SODIUM (PORCINE) 1000 UNIT/ML DIALYSIS: 20.0000 [IU]/kg | INTRAMUSCULAR | Status: DC | PRN

## 2012-09-03 MED ORDER — PENTAFLUOROPROP-TETRAFLUOROETH EX AERO: 1.0000 "application " | INHALATION_SPRAY | CUTANEOUS | Status: DC | PRN

## 2012-09-03 MED ORDER — HEPARIN SOD (PORK) LOCK FLUSH 100 UNIT/ML IV SOLN
250.0000 [IU] | INTRAVENOUS | Status: AC | PRN
  Administered 2012-09-03: 250 [IU]

## 2012-09-03 MED ORDER — SODIUM CHLORIDE 0.9 % IV SOLN: 100.0000 mL | INTRAVENOUS | Status: DC | PRN

## 2012-09-03 MED ORDER — GLUCOSE 40 % PO GEL: 1.0000 | ORAL | Status: DC | PRN

## 2012-09-03 MED ORDER — DIPHENHYDRAMINE HCL 50 MG/ML IJ SOLN: 25.0000 mg | Freq: Four times a day (QID) | INTRAMUSCULAR | Status: DC | PRN

## 2012-09-03 MED ORDER — ALTEPLASE 2 MG IJ SOLR
2.0000 mg | Freq: Once | INTRAMUSCULAR | Status: DC | PRN
  Filled 2012-09-03: qty 2

## 2012-09-03 MED ORDER — METOPROLOL TARTRATE 25 MG/10 ML ORAL SUSPENSION: 6.2000 mg | Freq: Every day | ORAL | Status: DC

## 2012-09-03 MED ORDER — PRO-STAT SUGAR FREE PO LIQD: 30.0000 mL | Freq: Every morning | ORAL | Status: DC

## 2012-09-03 MED ORDER — CINACALCET HCL 30 MG PO TABS: 60.0000 mg | ORAL_TABLET | Freq: Every day | ORAL | Status: DC

## 2012-09-03 MED ORDER — SEVELAMER HCL 400 MG PO TABS: 400.0000 mg | ORAL_TABLET | Freq: Three times a day (TID) | ORAL | Status: DC

## 2012-09-03 MED ORDER — METHADONE HCL 5 MG PO TABS: 5.0000 mg | ORAL_TABLET | Freq: Four times a day (QID) | ORAL | Status: DC

## 2012-09-03 NOTE — Progress Notes (Addendum)
Patient is to be discharged to [facility]. Patient AVS reviewed and faxed to [facility].  Report called to RN who will be receiving patient. [facility] called to provide transport. Patient to be transferred to [facility] with her Right Subclavian Hemodialysis catheter and Left Brachial PICC line intact, per Dr. Richardean Chimera order. The HD cath is capped and the PICC line has been assessed and Heparin locked by the IV team.  Patient remains stable; no signs or symptoms of distress.  Patient educated to return to the ER or to notify [facility] staff in cases of severe pain, SOB, dizziness, fever, chest pain, or syncope.

## 2012-09-03 NOTE — Progress Notes (Signed)
[facility], spoke to Nursing Supervisor and they can admit today. Provided info to Unit RN to call report to [facility], [phone] ext [phone]. Isidoro Donning RN CCM Case Mgmt phone [phone]

## 2012-09-03 NOTE — Discharge Summary (Signed)
Physician Discharge Summary  [name] VHQ:[id number] DOB: [birth year] DOA: 08/13/2012  PCP: Jyl Heinz, MD  Admit date: 08/13/2012 Discharge date: 09/03/2012  Time spent: Greater than 30 minutes  Recommendations for Outpatient Follow-up:  1. Continue hemodialysis on Tuesdays, Thursdays and Saturdays. 2. Repeat CT abdomen after completion of 14 days of IV antibiotics to determine further care.  Discharge Diagnoses:  Principal Problem:   SOB (shortness of breath) Active Problems:   Chronic systolic congestive heart failure, NYHA class 3   Hypertension   Pancreatitis, chronic   Anemia in chronic kidney disease (CKD)   ESRD on hemodialysis   Leukocytosis   Hyponatremia   HCAP (healthcare-associated pneumonia)   Acute on chronic systolic heart failure   Fever, unspecified   Abscess, abdomen   Abdominal pain, left upper quadrant   Poor appetite   Hypoglycemia   Discharge Condition: Improved & Stable  Diet recommendation: Regular diet  Filed Weights   09/01/12 2022 09/03/12 0730 09/03/12 1146  Weight: 47.299 kg (104 lb 4.4 oz) 49.3 kg (108 lb 11 oz) 45.5 kg (100 lb 5 oz)    History of present illness:  62 year old female patient, was a resident at [facility] nursing facility, with PMH of ESRD on TTS HD, ischemic cardiomyopathy/chronic systolic CHF with last EF 10%, post AICD, cirrhosis, chronic pancreatitis with history of 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 She apparently was hypoxic at 85%. Chest x-ray showed new right pleural effusion and white cell count was 18,900. She was admitted for further evaluation and management.  Hospital Course:  1. Acute on chronic systolic congestive heart failure: Secondary to ESRD and volume overload. Nephrology was consulted and patient has continued regular hemodialysis. This is  compensated. 2. ESRD: On HD TTS: Patient was last dialyzed on 3/22. Nephrology continued to see patient in the hospital. Continue dialysis at [facility]. 3. Hypertension: Blood pressures are controlled on low dose metoprolol. 4. Anemia: Status post PRBC transfusion. Stable. 5. History of CAD/ischemic cardiomyopathy: Stable and compensated. 6. Chronic pain: Palliative care consulted for goals of care and symptom management. Methadone dose was reduced secondary to sedation. Pain is controlled. 7. Possible healthcare acquired pneumonia: Completed broad-spectrum IV antibiotics. 8. Cirrhosis: Poor overall prognosis. 9. Recurrent hypoglycemia: Likely secondary to poor oral intake, ESRD and cirrhosis. Patient is on D10 water at 30 mL per hour. This can be titrated up as needed. Monitor CBGs closely  10. Hyponatremia: Resolved 11. Chronic pancreatitis, recurrent peripancreatic infected fluid collections: On 08/19/12 patient had worsening abdominal pain, leukocytosis and fevers. Admission CT did not show abscess but repeat CT showed abscesses. Surgery consulted and indicated that she was high risk for surgical intervention. Interventional radiology was consulted and percutaneous drain was done on 08/20/12. Her antibiotic spectrum was broadened. Final cultures grew KPC Enterococcal cloacae. ID was consulted and recommended Colistin. Dr. Luciana Axe (progress note 08/25/12) indicated that this is difficult to treat and high likelihood that medical therapy with antibiotics will not be successful. He he recommended palliative care consultation and comfort care is the best option. He also indicates that once this antibiotic is removed, her WBC will rise again and she will likely get sick. He recommended to weeks of this antibiotic and either stop and observe clinically or check a CT scan at that time if she is agreeable but that would likely not change anything since aggressive intervention not desired a persistent  fluid collection  found. PICC line was placed. 12. DO NOT RESUSCITATE: Palliative care consulted on 08/28/12. Goals of care were done with patient and her family. Patient opted for antibiotic treatment for 2 weeks and continue treatment at skilled nursing facility. She also wished to have repeat abdominal imaging post antibiotics. 13. Severe protein calorie malnutrition.: Patient has been eating better in the last couple of days.  Procedures:  Left arm midline PICC  Percutaneous aspiration of infected pancreatic abscess  Hemodialysis   Consultations:  Nephrology  Surgery  Interventional radiology  Infectious disease  Palliative care team  Discharge Exam: Patient has intermittent mild left-sided abdominal pain. Eating better. Still having periodic hypoglycemic episodes.  Complaints:  Filed Vitals:   09/03/12 1130 09/03/12 1146 09/03/12 1221 09/03/12 1432  BP: 110/80 114/82 121/83   Pulse: 97 92 97 100  Temp:  98.6 F (37 C)  97.8 F (36.6 C)  TempSrc:  Oral  Oral  Resp: 25 27  20   Height:      Weight:  45.5 kg (100 lb 5 oz)    SpO2:  100% 99% 100%    General: Middle-aged female looking older than stated age. Frail and chronically ill-looking. Appears comfortable without distress.  Heart: S1 and S2 heard, RRR. No JVD, murmurs or pedal edema.  Lungs: Reduced breath sounds in bases but otherwise clear to auscultation.. No increased work of breathing.  Abdomen: Mildly distended but soft. Mild tenderness LLQ & LMQ without rigidity, rebound or guarding. Normal bowel sounds heard.  EXT: Symmetric 5/5 power.  CNS: Alert and oriented to person, place and partly to time. No focal neurological deficits.   Discharge Instructions      Discharge Orders   Future Orders Complete By Expires     Call MD for:  difficulty breathing, headache or visual disturbances  As directed     Call MD for:  persistant nausea and vomiting  As directed     Call MD for:  severe uncontrolled pain  As directed      Call MD for:  temperature >100.4  As directed     Diet general  As directed     Increase activity slowly  As directed         Medication List    TAKE these medications       aspirin 81 MG EC tablet  Take 1 tablet (81 mg total) by mouth daily.     cinacalcet 30 MG tablet  Commonly known as:  SENSIPAR  Take 2 tablets (60 mg total) by mouth daily with breakfast.     dextrose 10 % infusion  Inject 30 mL/hr into the vein continuous.     dextrose 40 % Gel  Commonly known as:  GLUTOSE  Take 37.5 g by mouth as needed (CBG less than 70 or CBG greater than 70 and next meal is more than 1 hours away).     diphenhydrAMINE 50 MG/ML injection  Commonly known as:  BENADRYL  Inject 0.5 mLs (25 mg total) into the vein every 6 (six) hours as needed for itching (if patient develops itching with oral Dilaudid).     DSS 100 MG Caps  Take 100 mg by mouth 2 (two) times daily.     feeding supplement (NEPRO CARB STEADY) Liqd  Take 237 mLs by mouth 2 (two) times daily with breakfast and lunch.     feeding supplement Liqd  Take 30 mLs by mouth every morning.     HYDROmorphone 2  MG tablet  Commonly known as:  DILAUDID  Take 1 tablet (2 mg total) by mouth every 6 (six) hours as needed (severe pain.). For pain     megestrol 40 MG/ML suspension  Commonly known as:  MEGACE  Take 20 mLs (800 mg total) by mouth daily.     methadone 5 MG tablet  Commonly known as:  DOLOPHINE  Take 1 tablet (5 mg total) by mouth 4 (four) times daily.     metoprolol tartrate 25 mg/10 mL Susp  Commonly known as:  LOPRESSOR  Take 2.5 mLs (6.2 mg total) by mouth at bedtime.     multivitamin Tabs tablet  Take 1 tablet by mouth daily.     pantoprazole 40 MG tablet  Commonly known as:  PROTONIX  Take 40 mg by mouth daily.     pravastatin 20 MG tablet  Commonly known as:  PRAVACHOL  Take 20 mg by mouth at bedtime.     ranolazine 500 MG 12 hr tablet  Commonly known as:  RANEXA  Take 500 mg by mouth daily.      sevelamer 400 MG tablet  Commonly known as:  RENAGEL  Take 1 tablet (400 mg total) by mouth 3 (three) times daily with meals. With a meal     sodium chloride 0.9 % SOLN 100 mL with colistimethate 150 MG SOLR  1.5 mg/kg/day  46.6 kg = 69.75 mg, Intravenous, for 30 Minutes, Every 24 hours, Started 08/25/2012. Complete total 14 days.          The results of significant diagnostics from this hospitalization (including imaging, microbiology, ancillary and laboratory) are listed below for reference.    Significant Diagnostic Studies: Dg Chest 1 View  08/14/2012  *RADIOLOGY REPORT*  Clinical Data: Cough and shortness of breath, fever  CHEST - 1 VIEW  Comparison: 08/13/2012; 07/12/2012; 06/25/2012; CT abdomen pelvis - 07/12/2012  Findings: Grossly unchanged large cardiac silhouette and mediastinal contours post median sternotomy and CABG. Atherosclerotic calcifications within the aortic arch and descending thoracic aorta.  Stable position of support apparatus. Grossly unchanged small right-sided pleural effusion and right basilar heterogeneous opacities.  Pulmonary vasculature is less distinct than present examination, in particular within the right lung.  No definite pneumothorax.  Unchanged bones.  IMPRESSION: 1.  Grossly unchanged small right-sided effusion and right basilar opacities, atelectasis versus infiltrate. 2.  Suspected worsening of asymmetric right-sided pulmonary edema.   Original Report Authenticated By: Tacey Ruiz, MD    Dg Chest 2 View  08/19/2012  *RADIOLOGY REPORT*  Clinical Data: Productive cough with shortness of breath  CHEST - 2 VIEW  Comparison: 08/18/2012  Findings: Right internal jugular dialysis catheter is stable in position. A three lead pacer is in place via a left subclavian approach with lead tips stable and lines appearing intact. The patient is status post median sternotomy and CABG  An interval improvement in lung volumes is noted in comparison with the previous exam.   A stable degree of cardiomegaly is identified. Pulmonary vascular congestion with mild interstitial edema is again noted.  There has been continued improvement in aeration at the right lung base but residual atelectasis is identified as evidenced by posterior displacement of the right major fissure.  No new areas of focal infiltrate or atelectasis are identified. Bony structures remain intact.  IMPRESSION: Persistent cardiomegaly with mild interstitial edema.  Improved right basilar aeration.   Original Report Authenticated By: Rhodia Albright, M.D.    Dg Chest 2 View  08/18/2012  *  RADIOLOGY REPORT*  Clinical Data: Follow-up pleural effusions.  CHEST - 2 VIEW  Comparison: 08/14/2012.  Findings: Right IJ dialysis catheter tip projects over the SVC/RA junction or high right atrium.  Left subclavian pacemaker and AICD lead tips are stable in position.  Heart is enlarged, unchanged. Thoracic aorta is calcified.  Lungs are low in volume with mild diffuse bilateral air space disease.  Right lung aeration may have improved minimally in the interval.  Small bilateral pleural effusions, right greater than left.  IMPRESSION: Congestive heart failure with possible minimal interval improvement in right lung aeration.   Original Report Authenticated By: Leanna Battles, M.D.    Dg Chest 2 View  08/13/2012  *RADIOLOGY REPORT*  Clinical Data: 62 year old female shortness of breath and chest pain.  CHEST - 2 VIEW  Comparison: 06/25/2012.  Findings: Stable right IJ approach dual lumen dialysis type catheter.  Stable left chest cardiac AICD. Stable cardiomegaly and mediastinal contours.  Sequelae of CABG.  Lower lung volumes.  New veiling opacity at the right lung base.  Fluid tracking into the right major fissure on the lateral view.  No pneumothorax.  No overt pulmonary edema.  No confluent pulmonary opacity. Osteopenia.  IMPRESSION: 1.  Lower lung volumes and new right pleural effusion, tracking into the right major fissure. 2.   Otherwise stable chest with severe cardiomegaly.   Original Report Authenticated By: Erskine Speed, M.D.    Ct Head Wo Contrast  08/28/2012  *RADIOLOGY REPORT*  Clinical Data: Acute mental status changes.  Patient unresponsive.  CT HEAD WITHOUT CONTRAST  Technique:  Contiguous axial images were obtained from the base of the skull through the vertex without contrast.  Comparison: Unenhanced cranial CT 10/05/2011, 07/02/2003.  MRI brain 08/06/2003.  Findings: Patient motion blurred several of the images, these were repeated with persistent motion, but ultimately a diagnostic study was obtained.  Ventricular system normal in size and appearance for age.  Stable mild cortical atrophy.  Stable mild changes of small vessel disease of the white matter.  No mass lesion.  No midline shift.  No acute hemorrhage or hematoma.  No extra-axial fluid collections.  No evidence of acute infarction.  Mild changes of hyperostosis frontalis interna.  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 Visualized paranasal sinuses, bilateral mastoid air cells, and bilateral middle ear cavities well-aerated. Extensive bilateral carotid siphon and vertebral artery atherosclerosis.  IMPRESSION:  1.  No acute intracranial abnormality. 2.  Stable mild cortical atrophy and mild chronic microvascular ischemic changes of the white matter.   Original Report Authenticated By: Hulan Saas, M.D.    US Abdomen Complete  08/25/2012  *RADIOLOGY REPORT*  Clinical Data: Evaluate for ascites.  Abdominal pain.  LIMITED ABDOMINAL ULTRASOUND  Comparison:  CT of the abdomen and pelvis 08/20/2012.  Findings: Limited imaging of the abdomen demonstrated a small amount of ascites scattered throughout the peritoneal cavity.  IMPRESSION: Study is positive for a small volume of ascites.   Original Report Authenticated By: Trudie Reed, M.D.    Ct Abdomen Pelvis W Contrast  08/19/2012  *RADIOLOGY  REPORT*  Clinical Data: Abdominal distension, left lower quadrant tenderness  CT ABDOMEN AND PELVIS WITH CONTRAST  Technique:  Multidetector CT imaging of the abdomen and pelvis was performed following the standard protocol during bolus administration of intravenous contrast.  Contrast:  80 ml Omnipaque-300 IV  Comparison: 08/14/2012  Findings: Patchy right lower lobe opacity, atelectasis versus pneumonia.  Mild  associated small right pleural effusion.  Mild patchy left basilar opacity, likely atelectasis, with trace left pleural effusion.  Cardiomegaly.  Heterogeneous perfusion of the liver and spleen.  Portal vein is not visualized, possibly related to phase of enhancement, thrombosis not excluded.  Pancreatic atrophy with fluid/scattered foci of gas along the pancreatic tail and splenic hilum.  3.3 x 1.9 cm fluid and gas collection along the left lateral abdominal wall (series 2/image 21), suspicious for complex pseudocyst/abscess, mildly increased (previously 2.0 x 1.0 cm).  1.5 x 2.7 cm fluid and gas collection anterior to the proximal pancreatic body (series 2/image 19), new, suspicious for developing complex pseudocyst/abscess.  Adrenal glands are unremarkable.  Status post cholecystectomy.  Kidneys are notable for renal atrophy and bilateral renal cysts.  No evidence of bowel obstruction.  Large volume abdominopelvic ascites.  Mild peritoneal thickening in the right paracolic gutter (series 2/image 28), possibly reflecting peritonitis.  Pelvic organs are poorly visualized due to ascites and poor soft tissues contrast.  Body wall edema / anasarca.  Degenerative changes of the visualized thoracolumbar spine.  IMPRESSION: Pancreatic atrophy with fluid/scattered foci of gas along the pancreatic tail and splenic hilum.  3.3 x 1.9 cm fluid and gas collection along the left lateral abdominal wall, increased, suspicious for a complex pseudocyst/abscess.  1.5 x 2.7 cm fluid and gas collection anterior to the  pancreatic body, new, worrisome for developing complex pseudocyst/abscess.  Large volume abdominopelvic ascites.  Mild peritoneal thickening in the right paracolic gutter, possibly reflecting peritonitis.  Patchy right lower lobe opacity, atelectasis versus pneumonia, with associated small right pleural effusion.   Original Report Authenticated By: Charline Bills, M.D.    Ct Abdomen Pelvis W Contrast  08/14/2012  *RADIOLOGY REPORT*  Clinical Data: Pancreatitis, evaluate for worsening pseudocyst or abscess  CT ABDOMEN AND PELVIS WITH CONTRAST  Technique:  Multidetector CT imaging of the abdomen and pelvis was performed following the standard protocol during bolus administration of intravenous contrast.  Contrast: 80mL OMNIPAQUE IOHEXOL 300 MG/ML  SOLN  Comparison: 07/12/2012  Findings: Moderate right pleural effusion.  Associated right lower lobe opacity, likely compressive atelectasis.  Mild patchy opacity at the left lung base (series 3/image 12), atelectasis versus pneumonia.  Cardiomegaly.  Heterogeneous early arterial perfusion of the liver, possibly reflecting passive congestion.  Spleen and adrenal glands are unremarkable.  Pancreatic atrophy.  No well-defined, drainable pseudocyst/abscess.  However, again noted are multiple scattered foci of gas along the pancreatic tail and spleen (series 2/images 19 through 30).  2.0 x 1.0 cm gas and fluid collection adjacent to the left lateral abdominal wall (series 2/image 32).  These findings are grossly unchanged.  Status post cholecystectomy.  No intrahepatic or extrahepatic ductal dilatation.  Bilateral renal atrophy with cysts.  No hydronephrosis.  No evidence of bowel obstruction.  Atherosclerotic calcifications of the abdominal aorta and branch vessels.  Large volume abdominopelvic ascites, including peritoneal thickening / enhancement in the right paracolic gutter (series 2/image 38), grossly unchanged.  Status post hysterectomy.  No adnexal masses.  Bladder  is underdistended/displaced by pelvic ascites.  Anasarca/body wall edema.  Mild degenerative changes of the visualized thoracolumbar spine.  IMPRESSION: Pancreatic atrophy without a well-defined, drainable pseudocyst/abscess.  Scattered foci of gas along the pancreatic tail/spleen in the left upper abdomen.  2.0 x 1.0 cm gas and fluid collection adjacent the left lateral abdominal wall.  These findings are grossly unchanged.  Large volume abdominopelvic ascites, including peritoneal thickening / enhancement in the right paracolic gutter, grossly unchanged.  Original Report Authenticated By: Charline Bills, M.D.    Ct Aspiration  08/20/2012  *RADIOLOGY REPORT*  Indication: Place aspirate dominant left-sided abdominal fluid collection and send for cultures.  CT GUIDED ASPIRATIONOF AIR AND FLUID COLLECTION WITHIN THE LEFT UPPER ABDOMINAL QUADRANT  Comparison: CT abdomen pelvis - 08/19/2012; 07/17/2012  Medications: Versed 0.5 mg IV  Contrast: None  Sedation time: 13 minutes  Complications: None immediate  TECHNIQUE/FINDINGS:  Informed consent was obtained from the patient following an explanation of the procedure, risks, benefits and alternatives.  A time out was performed prior to the initiation of the procedure.  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  Redemonstrated are innumerable extraluminal foci of air are within the left upper abdominal quadrant.  The procedure was planned.  The operative site was prepped and draped in the usual sterile fashion.   Appropriate trajectory was confirmed with a 22 gauge spinal needle after the adjacent tissues were anesthetized with 1% Lidocaine with epinephrine.  Under intermittent CT guidance, a 5-French Yueh sheath needle was advanced  into the fluid.  Appropriate positioning was confirmed with CT fluoroscopy.  Approximately 30 ml of purulent, slightly bloody fluid was aspirated from the Yueh sheath needle as it was withdrawn.  Postprocedural imaging was obtained demonstrating near resolution of previously noted dominant fluid collection with multiple foci of extraluminal gas again seen within the right upper abdominal quadrant.  Negative for complication.  Hemostasis was achieved with manual compression.  A dressing was placed.  The patient tolerated the procedure well without immediate postprocedural complication.  IMPRESSION:  Technically successful CT guided aspiration of air and fluid containing abscess/pseudocyst within the left upper abdominal quadrant yielding approximately 30 ml of purulent, slightly bloody fluid.  The sample was capped and sent to the laboratory for analysis.   Original Report Authenticated By: Tacey Ruiz, MD    Ir US Guide Vasc Access Left  08/29/2012  *RADIOLOGY REPORT*  Clinical history:62 year old with end-stage renal disease and left subclavian cardiac ICD.  The patient has an infected pseudocyst and needs long-term IV antibiotics.  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  Request for a left arm midline PICC line.  PROCEDURE(S): PLACEMENT OF A LEFT ARM MIDLINE PICC LINE WITH ULTRASOUND AND FLUOROSCOPIC GUIDANCE  Physician: Rachelle Hora. Henn, MD  Medications:None  Moderate sedation time:None  Fluoroscopy time: 0.4 minutes  Contrast:  8 ml Omnipaque-300  Procedure:Informed consent was obtained for left arm PICC line. The left arm was with ultrasound.  The patient had a patent left brachial vein by ultrasound.  The left side arm was prepped and draped in a sterile fashion.  Maximal barrier sterile technique was utilized including caps, mask, sterile gowns, sterile gloves, sterile drape, hand hygiene and skin antiseptic.  The skin was  anesthetized with lidocaine.  21 gauge needle was directed into the left brachial vein with ultrasound guidance.  Wire was advanced into the left axilla.  Wire would not advance centrally.  Left upper extremity venogram was performed.  A dual lumen Power PICC line was cut to 15 cm.  Catheter placed through the peel-away sheath and positioned in the left axilla.  Both lumens aspirated and flushed well.  Catheter was sutured  to the skin.Fluoroscopic and ultrasound images were taken and saved for documentation.  Findings:Left subclavian ICD.  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  There are large collateral vessels in the left upper chest that drain into the left innominate vein.  Complications: None  Impression:Successful placement of a left midline PICC line. Catheter tip is in the left axilla.  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   Original Report Authenticated By: Richarda Overlie, M.D.    Ir Fluoro Guide Cv Midline Picc Left  08/29/2012  *RADIOLOGY REPORT*  Clinical history:62 year old with end-stage renal disease and left subclavian cardiac ICD.  The patient has an infected pseudocyst and needs long-term IV antibiotics.  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  Request for a left arm midline PICC line.  PROCEDURE(S): PLACEMENT OF A LEFT ARM MIDLINE PICC LINE WITH ULTRASOUND AND FLUOROSCOPIC GUIDANCE  Physician: Rachelle Hora. Henn, MD  Medications:None  Moderate sedation time:None  Fluoroscopy time: 0.4 minutes  Contrast:  8 ml Omnipaque-300  Procedure:Informed consent was obtained for left arm PICC line. The left arm was with ultrasound.  The patient had a patent left brachial vein by ultrasound.  The left side arm was prepped and draped in a sterile fashion.  Maximal barrier sterile technique was utilized including caps, mask, sterile gowns, sterile gloves, sterile drape, hand  hygiene and skin antiseptic.  The skin was anesthetized with lidocaine.  21 gauge needle was directed into the left brachial vein with ultrasound guidance.  Wire was advanced into the left axilla.  Wire would not advance centrally.  Left upper extremity venogram was performed.  A dual lumen Power PICC line was cut to 15 cm.  Catheter placed through the peel-away sheath and positioned in the left axilla.  Both lumens aspirated and flushed well.  Catheter was sutured to the skin.Fluoroscopic and ultrasound images were taken and saved for documentation.  Findings:Left subclavian ICD.  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  There are large collateral vessels in the left upper chest that drain into the left innominate vein.  Complications: None  Impression:Successful placement of a left midline PICC line. Catheter tip is in the left axilla.  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   Original Report Authenticated By: Richarda Overlie, M.D.     Microbiology: No results found for this or any previous visit (from the past 240 hour(s)).   Labs: Basic Metabolic Panel:  Recent Labs Lab 08/30/12 1103 09/01/12 0802 09/03/12 0745  NA 147* 141 134*  K 4.4 4.1 3.4*  CL 112 104 98  CO2 23 30 28   GLUCOSE 126* 80 137*  BUN 48* 43* 37*  CREATININE 2.80* 2.77* 2.51*  CALCIUM 9.3 11.4* 11.0*  PHOS 4.3  --  4.4   Liver Function Tests:  Recent Labs Lab 08/30/12 1103 09/03/12 0745  ALBUMIN 1.3* 1.6*   No results found for this basename: LIPASE, AMYLASE,  in the last 168 hours No results found for this basename: AMMONIA,  in the last 168 hours CBC:  Recent Labs Lab 08/30/12 1103 09/01/12 0802 09/03/12 0745  WBC 11.5* 9.6 10.3  HGB 7.5* 7.8* 7.9*  HCT 24.3* 25.3* 25.2*  MCV 93.5 93.0 94.0  PLT 227 223 252   Cardiac Enzymes: No results found for this basename: CKTOTAL, CKMB, CKMBINDEX, TROPONINI,  in the  last 168 hours BNP: BNP (last 3  results)  Recent Labs  04/21/12 1419 05/08/12 0515 06/02/12 1944  PROBNP >70000.0* >70000.0* >70000.0*   CBG:  Recent Labs Lab 09/03/12 09/03/12 0647 09/03/12 0711 09/03/12 1230 09/03/12 1414  GLUCAP 85 65* 161* 57* 128*     Signed:  Dashanique Brownstein  [facility] 09/03/2012, 4:13 PM

## 2012-09-03 NOTE — Progress Notes (Signed)
Subjective:  Seen on dialysis, no current complaints, no nausea or vomiting.  Objective: Vital signs in last 24 hours: Temp:  [97.3 F (36.3 C)-98.6 F (37 C)] 98.6 F (37 C) (03/22 0730) Pulse Rate:  [88-98] 88 (03/22 0900) Resp:  [15-20] 15 (03/22 0900) BP: (106-139)/(62-96) 106/62 mmHg (03/22 0900) SpO2:  [93 %-100 %] 93 % (03/22 0730) Weight:  [49.3 kg (108 lb 11 oz)] 49.3 kg (108 lb 11 oz) (03/22 0730) Weight change:   Intake/Output from previous day: 03/21 0701 - 03/22 0700 In: 1586.1 [I.V.:778.7; IV Piggyback:807.4] Out: -    EXAM: General appearance:  Alert, in no apparent distress Resp:  CTA without rales, rhonchi, or wheezes Cardio:  RRR with Gr I/VI systolic murmur, no rub GI:  + BS, soft and nontender  Extremities:  1+ lower extremity edema Access:  Right IJ catheter with BFR 400 cc/min  Lab Results:  Recent Labs  09/01/12 0802 09/03/12 0745  WBC 9.6 10.3  HGB 7.8* 7.9*  HCT 25.3* 25.2*  PLT 223 252   BMET:  Recent Labs  09/01/12 0802 09/03/12 0745  NA 141 134*  K 4.1 3.4*  CL 104 98  CO2 30 28  GLUCOSE 80 137*  BUN 43* 37*  CREATININE 2.77* 2.51*  CALCIUM 11.4* 11.0*  ALBUMIN  --  1.6*   No results found for this basename: PTH,  in the last 72 hours Iron Studies: No results found for this basename: IRON, TIBC, TRANSFERRIN, FERRITIN,  in the last 72 hours  Dialysis Orders: Center: [facility] on TTS.  EDW 53.5 kg HD Bath 3K/2.5Ca Time 4hrs Heparin 1600 U. Access Right IJ catheter BFR 400 DFR 800 Hectorol 0 mcg IV/HD Epogen 1500 Units IV/HD Venofer   Assessment/Plan: 1. Infected pancreatic pseudocyst - Enterobacter cloacae per aspiration of abscess 3/8, blood cultures negative; on IV Colymycin, improving.  2. ESRD HD on TTS @ [facility]; last K 3.4 using 4K bath. HD today.  3. Anemia - Hgb up to 7.9, s/p 2 units PRBCs 3/2; Aranesp 200 mcg on Thurs.  4. Hypotension/Volume - BP most recently 105/75, Metoprolol decreased to 6.25 mg qd; IV albumin per HD; wt  49.3 kg.  UF goal of 3 L. 5. Secondary hyperparathyroidism - Ca 11 (12.9 corrected), P 4.4, using 4K/2.25Ca bath to maintain K, Sensipar 60 mg qd, Renvela 400 mg with meals.  6. Protein calorie malnutrition - Alb 1.6, regular diet with protein supplement and Nepro.  7. Hypoglycemia- not medication related.  This can be seen in renal failure pts who have severe malnutrition and/or liver disease; she has both.  The liver and kidney provide gluconeogenesis under normal conditions. The condition in my experience has been with pts who are severely chronically ill and near EOL.  Getting D10 IVF's now, may need tube feeding if this persists. 8. Chronic systolic CHF - dilated LV with severe systolic dysfunction, severe MR, EF 20-25% per echo 3/5.  9. AMS - improving after Neurontin was dc'd.  10. DNR - followed by Palliative Care, transfer to LTAC ([facility]) likely today.     LOS: 21 days   LYLES,CHARLES 09/03/2012,9:10 AM  Patient seen and examined.  I agree with plan as above with additions as indicated. Vinson Moselle  MD [phone] pgr    [phone] cell 09/03/2012, 10:00 AM

## 2012-09-03 NOTE — Procedures (Signed)
I was present at this dialysis session. I have reviewed the session itself and made appropriate changes.   Rob Medea Deines, MD [facility] 09/03/2012, 9:38 AM   

## 2012-09-03 NOTE — Progress Notes (Signed)
CBG: 57  Treatment: 15 GM carbohydrate snack and D50 IV 25 mL  Symptoms: Pale  Follow-up CBG: Time:1414 CBG Result:128  Possible Reasons for Event: Inadequate meal intake  Comments/MD notified:Dr. Hongalgi text paged. No new orders given at this time. Will encourage intake of meals, will sit with patient and feed.    Audie Pinto

## 2012-09-05 ENCOUNTER — Telehealth: Payer: Self-pay

## 2012-09-05 NOTE — Progress Notes (Signed)
Agree with above 

## 2012-09-23 ENCOUNTER — Other Ambulatory Visit (HOSPITAL_COMMUNITY): Payer: Self-pay | Admitting: Diagnostic Radiology

## 2012-10-08 ENCOUNTER — Emergency Department (HOSPITAL_COMMUNITY): Payer: Non-veteran care

## 2012-10-08 ENCOUNTER — Inpatient Hospital Stay (HOSPITAL_COMMUNITY)
Admission: EM | Admit: 2012-10-08 | Discharge: 2012-10-13 | DRG: 280 | Disposition: A | Discharge status: Home / Self Care | Payer: Non-veteran care | Attending: Cardiology | Admitting: Cardiology

## 2012-10-08 DIAGNOSIS — K861 Other chronic pancreatitis: Secondary | ICD-10-CM | POA: Diagnosis present

## 2012-10-08 DIAGNOSIS — Z79899 Other long term (current) drug therapy: Secondary | ICD-10-CM

## 2012-10-08 DIAGNOSIS — I4729 Other ventricular tachycardia: Secondary | ICD-10-CM | POA: Diagnosis present

## 2012-10-08 DIAGNOSIS — I509 Heart failure, unspecified: Secondary | ICD-10-CM | POA: Diagnosis present

## 2012-10-08 DIAGNOSIS — I472 Ventricular tachycardia, unspecified: Secondary | ICD-10-CM | POA: Diagnosis present

## 2012-10-08 DIAGNOSIS — E871 Hypo-osmolality and hyponatremia: Secondary | ICD-10-CM | POA: Diagnosis not present

## 2012-10-08 DIAGNOSIS — E119 Type 2 diabetes mellitus without complications: Secondary | ICD-10-CM | POA: Diagnosis present

## 2012-10-08 DIAGNOSIS — Z885 Allergy status to narcotic agent status: Secondary | ICD-10-CM

## 2012-10-08 DIAGNOSIS — I2510 Atherosclerotic heart disease of native coronary artery without angina pectoris: Secondary | ICD-10-CM | POA: Diagnosis present

## 2012-10-08 DIAGNOSIS — Z87891 Personal history of nicotine dependence: Secondary | ICD-10-CM

## 2012-10-08 DIAGNOSIS — I214 Non-ST elevation (NSTEMI) myocardial infarction: Principal | ICD-10-CM | POA: Diagnosis present

## 2012-10-08 DIAGNOSIS — R7989 Other specified abnormal findings of blood chemistry: Secondary | ICD-10-CM

## 2012-10-08 DIAGNOSIS — K769 Liver disease, unspecified: Secondary | ICD-10-CM | POA: Diagnosis present

## 2012-10-08 DIAGNOSIS — I2589 Other forms of chronic ischemic heart disease: Secondary | ICD-10-CM | POA: Diagnosis present

## 2012-10-08 DIAGNOSIS — I959 Hypotension, unspecified: Secondary | ICD-10-CM | POA: Diagnosis present

## 2012-10-08 DIAGNOSIS — K7460 Unspecified cirrhosis of liver: Secondary | ICD-10-CM | POA: Diagnosis present

## 2012-10-08 DIAGNOSIS — I5022 Chronic systolic (congestive) heart failure: Secondary | ICD-10-CM | POA: Diagnosis present

## 2012-10-08 DIAGNOSIS — E46 Unspecified protein-calorie malnutrition: Secondary | ICD-10-CM | POA: Diagnosis present

## 2012-10-08 DIAGNOSIS — R000 Tachycardia, unspecified: Secondary | ICD-10-CM

## 2012-10-08 DIAGNOSIS — Z992 Dependence on renal dialysis: Secondary | ICD-10-CM

## 2012-10-08 DIAGNOSIS — Z681 Body mass index (BMI) 19 or less, adult: Secondary | ICD-10-CM

## 2012-10-08 DIAGNOSIS — Z951 Presence of aortocoronary bypass graft: Secondary | ICD-10-CM

## 2012-10-08 DIAGNOSIS — I8500 Esophageal varices without bleeding: Secondary | ICD-10-CM | POA: Diagnosis present

## 2012-10-08 DIAGNOSIS — R1012 Left upper quadrant pain: Secondary | ICD-10-CM

## 2012-10-08 DIAGNOSIS — I255 Ischemic cardiomyopathy: Secondary | ICD-10-CM | POA: Diagnosis present

## 2012-10-08 DIAGNOSIS — I447 Left bundle-branch block, unspecified: Secondary | ICD-10-CM | POA: Diagnosis present

## 2012-10-08 DIAGNOSIS — R64 Cachexia: Secondary | ICD-10-CM | POA: Diagnosis present

## 2012-10-08 DIAGNOSIS — Z95810 Presence of automatic (implantable) cardiac defibrillator: Secondary | ICD-10-CM | POA: Diagnosis present

## 2012-10-08 DIAGNOSIS — I471 Supraventricular tachycardia, unspecified: Secondary | ICD-10-CM

## 2012-10-08 DIAGNOSIS — N186 End stage renal disease: Secondary | ICD-10-CM

## 2012-10-08 DIAGNOSIS — I8510 Secondary esophageal varices without bleeding: Secondary | ICD-10-CM | POA: Diagnosis present

## 2012-10-08 DIAGNOSIS — Z7982 Long term (current) use of aspirin: Secondary | ICD-10-CM

## 2012-10-08 DIAGNOSIS — I120 Hypertensive chronic kidney disease with stage 5 chronic kidney disease or end stage renal disease: Secondary | ICD-10-CM | POA: Diagnosis present

## 2012-10-08 DIAGNOSIS — Z9861 Coronary angioplasty status: Secondary | ICD-10-CM

## 2012-10-08 DIAGNOSIS — N2581 Secondary hyperparathyroidism of renal origin: Secondary | ICD-10-CM | POA: Diagnosis present

## 2012-10-08 DIAGNOSIS — IMO0002 Reserved for concepts with insufficient information to code with codable children: Secondary | ICD-10-CM

## 2012-10-08 DIAGNOSIS — D696 Thrombocytopenia, unspecified: Secondary | ICD-10-CM | POA: Diagnosis present

## 2012-10-08 HISTORY — DX: Non-ST elevation (NSTEMI) myocardial infarction: I21.4

## 2012-10-08 LAB — CBC WITH DIFFERENTIAL/PLATELET
Eosinophils Absolute: 0 10*3/uL (ref 0.0–0.7)
Hemoglobin: 8.8 g/dL — ABNORMAL LOW (ref 12.0–15.0)
Lymphs Abs: 0.3 10*3/uL — ABNORMAL LOW (ref 0.7–4.0)
MCH: 29.7 pg (ref 26.0–34.0)
Monocytes Relative: 6 % (ref 3–12)
Neutrophils Relative %: 92 % — ABNORMAL HIGH (ref 43–77)
RBC: 2.96 MIL/uL — ABNORMAL LOW (ref 3.87–5.11)

## 2012-10-08 LAB — BASIC METABOLIC PANEL
BUN: 17 mg/dL (ref 6–23)
CO2: 29 mEq/L (ref 19–32)
Chloride: 99 mEq/L (ref 96–112)
GFR calc non Af Amer: 44 mL/min — ABNORMAL LOW (ref >90)
Glucose, Bld: 154 mg/dL — ABNORMAL HIGH (ref 70–99)
Potassium: 3.5 mEq/L (ref 3.5–5.1)

## 2012-10-08 MED ORDER — PANTOPRAZOLE SODIUM 40 MG PO TBEC
40.0000 mg | DELAYED_RELEASE_TABLET | Freq: Every day | ORAL | Status: DC
  Administered 2012-10-09 – 2012-10-13 (×5): 40 mg via ORAL
  Filled 2012-10-08 (×4): qty 1

## 2012-10-08 MED ORDER — HYDROMORPHONE HCL 2 MG PO TABS: 2.0000 mg | ORAL_TABLET | Freq: Once | ORAL | Status: DC

## 2012-10-08 MED ORDER — MIDAZOLAM HCL 2 MG/2ML IJ SOLN
INTRAMUSCULAR | Status: AC
  Administered 2012-10-08: 1 mg via INTRAVENOUS
  Filled 2012-10-08: qty 2

## 2012-10-08 MED ORDER — CINACALCET HCL 30 MG PO TABS
60.0000 mg | ORAL_TABLET | Freq: Every day | ORAL | Status: DC
  Administered 2012-10-09 – 2012-10-11 (×3): 60 mg via ORAL
  Filled 2012-10-08 (×6): qty 2

## 2012-10-08 MED ORDER — HYDROMORPHONE HCL 2 MG PO TABS
2.0000 mg | ORAL_TABLET | Freq: Four times a day (QID) | ORAL | Status: DC | PRN
  Filled 2012-10-08: qty 1

## 2012-10-08 MED ORDER — ONDANSETRON HCL 4 MG PO TABS: 4.0000 mg | ORAL_TABLET | Freq: Four times a day (QID) | ORAL | Status: DC | PRN

## 2012-10-08 MED ORDER — ZOLPIDEM TARTRATE 5 MG PO TABS: 5.0000 mg | ORAL_TABLET | Freq: Every evening | ORAL | Status: DC | PRN

## 2012-10-08 MED ORDER — NITROGLYCERIN 2 % TD OINT
0.5000 [in_us] | TOPICAL_OINTMENT | Freq: Four times a day (QID) | TRANSDERMAL | Status: DC
  Administered 2012-10-09 – 2012-10-13 (×14): 0.5 [in_us] via TOPICAL
  Filled 2012-10-08 (×26): qty 30

## 2012-10-08 MED ORDER — ACETAMINOPHEN 650 MG RE SUPP: 650.0000 mg | Freq: Four times a day (QID) | RECTAL | Status: DC | PRN

## 2012-10-08 MED ORDER — NEPRO/CARBSTEADY PO LIQD
237.0000 mL | Freq: Two times a day (BID) | ORAL | Status: DC
  Administered 2012-10-09 – 2012-10-11 (×5): 237 mL via ORAL
  Filled 2012-10-08 (×8): qty 237

## 2012-10-08 MED ORDER — ASPIRIN EC 81 MG PO TBEC
81.0000 mg | DELAYED_RELEASE_TABLET | Freq: Every day | ORAL | Status: DC
  Administered 2012-10-09 – 2012-10-13 (×5): 81 mg via ORAL
  Filled 2012-10-08 (×5): qty 1

## 2012-10-08 MED ORDER — ALPRAZOLAM 0.25 MG PO TABS: 0.2500 mg | ORAL_TABLET | Freq: Three times a day (TID) | ORAL | Status: DC | PRN

## 2012-10-08 MED ORDER — SODIUM CHLORIDE 0.9 % IJ SOLN: 3.0000 mL | INTRAMUSCULAR | Status: DC | PRN

## 2012-10-08 MED ORDER — RANOLAZINE ER 500 MG PO TB12
500.0000 mg | ORAL_TABLET | Freq: Every day | ORAL | Status: DC
  Administered 2012-10-09 – 2012-10-13 (×5): 500 mg via ORAL
  Filled 2012-10-08 (×5): qty 1

## 2012-10-08 MED ORDER — SEVELAMER CARBONATE 800 MG PO TABS
800.0000 mg | ORAL_TABLET | Freq: Three times a day (TID) | ORAL | Status: DC
  Administered 2012-10-09 – 2012-10-12 (×8): 800 mg via ORAL
  Filled 2012-10-08 (×17): qty 1

## 2012-10-08 MED ORDER — HYDROMORPHONE HCL PF 1 MG/ML IJ SOLN
0.5000 mg | INTRAMUSCULAR | Status: DC | PRN
  Administered 2012-10-09 – 2012-10-10 (×2): 1 mg via INTRAVENOUS
  Administered 2012-10-12: 0.5 mg via INTRAVENOUS
  Administered 2012-10-12: 1 mg via INTRAVENOUS
  Administered 2012-10-13: 0.5 mg via INTRAVENOUS
  Filled 2012-10-08 (×3): qty 1

## 2012-10-08 MED ORDER — ONDANSETRON HCL 4 MG PO TABS: 4.0000 mg | ORAL_TABLET | Freq: Three times a day (TID) | ORAL | Status: DC | PRN

## 2012-10-08 MED ORDER — SODIUM CHLORIDE 0.9 % IJ SOLN: 3.0000 mL | Freq: Two times a day (BID) | INTRAMUSCULAR | Status: DC

## 2012-10-08 MED ORDER — ONDANSETRON HCL 4 MG/2ML IJ SOLN
4.0000 mg | Freq: Four times a day (QID) | INTRAMUSCULAR | Status: DC | PRN
  Administered 2012-10-09: 4 mg via INTRAVENOUS
  Filled 2012-10-08: qty 2

## 2012-10-08 MED ORDER — HEPARIN (PORCINE) IN NACL 100-0.45 UNIT/ML-% IJ SOLN
1050.0000 [IU]/h | INTRAMUSCULAR | Status: DC
  Administered 2012-10-09: 900 [IU]/h via INTRAVENOUS
  Administered 2012-10-09: 600 [IU]/h via INTRAVENOUS
  Administered 2012-10-09: 750 [IU]/h via INTRAVENOUS
  Administered 2012-10-10: 900 [IU]/h via INTRAVENOUS
  Filled 2012-10-08 (×3): qty 250

## 2012-10-08 MED ORDER — METHADONE HCL 5 MG PO TABS
5.0000 mg | ORAL_TABLET | Freq: Four times a day (QID) | ORAL | Status: DC
  Administered 2012-10-09 – 2012-10-13 (×15): 5 mg via ORAL
  Filled 2012-10-08 (×5): qty 1
  Filled 2012-10-08: qty 2
  Filled 2012-10-08 (×9): qty 1

## 2012-10-08 MED ORDER — ASPIRIN 81 MG PO CHEW
324.0000 mg | CHEWABLE_TABLET | Freq: Once | ORAL | Status: AC
  Administered 2012-10-08: 324 mg via ORAL
  Filled 2012-10-08: qty 4

## 2012-10-08 MED ORDER — SODIUM CHLORIDE 0.9 % IV SOLN: 250.0000 mL | INTRAVENOUS | Status: DC | PRN

## 2012-10-08 MED ORDER — SIMVASTATIN 5 MG PO TABS
5.0000 mg | ORAL_TABLET | Freq: Every day | ORAL | Status: DC
  Administered 2012-10-09 – 2012-10-12 (×4): 5 mg via ORAL
  Filled 2012-10-08 (×6): qty 1

## 2012-10-08 MED ORDER — DOCUSATE SODIUM 100 MG PO CAPS
100.0000 mg | ORAL_CAPSULE | Freq: Two times a day (BID) | ORAL | Status: DC
  Administered 2012-10-09 – 2012-10-13 (×9): 100 mg via ORAL
  Filled 2012-10-08 (×12): qty 1

## 2012-10-08 MED ORDER — MIDAZOLAM HCL 2 MG/2ML IJ SOLN: 2.0000 mg | Freq: Once | INTRAMUSCULAR | Status: AC

## 2012-10-08 MED ORDER — HYDROMORPHONE HCL PF 1 MG/ML IJ SOLN
1.0000 mg | Freq: Once | INTRAMUSCULAR | Status: AC
  Administered 2012-10-08: 1 mg via INTRAVENOUS
  Filled 2012-10-08: qty 1

## 2012-10-08 MED ORDER — HEPARIN BOLUS VIA INFUSION
2000.0000 [IU] | Freq: Once | INTRAVENOUS | Status: AC
  Administered 2012-10-09: 2000 [IU] via INTRAVENOUS

## 2012-10-08 MED ORDER — RENA-VITE PO TABS
1.0000 | ORAL_TABLET | Freq: Every day | ORAL | Status: DC
  Administered 2012-10-09: 1 via ORAL
  Filled 2012-10-08: qty 1

## 2012-10-08 MED ORDER — GLUCOSE 40 % PO GEL: 1.0000 | ORAL | Status: DC | PRN

## 2012-10-08 MED ORDER — ACETAMINOPHEN 325 MG PO TABS
650.0000 mg | ORAL_TABLET | Freq: Four times a day (QID) | ORAL | Status: DC | PRN
  Filled 2012-10-08 (×2): qty 2

## 2012-10-08 MED ORDER — OXYCODONE HCL 5 MG PO TABS: 5.0000 mg | ORAL_TABLET | ORAL | Status: DC | PRN

## 2012-10-08 NOTE — ED Notes (Addendum)
Pt presents to ED via EMS from dialysis with svt. Pt reports generalized weakness. 98/54. Access to right chest.  After hemo treatment pt walked outside and felt "bad". Pt then went back inside.

## 2012-10-08 NOTE — ED Notes (Signed)
Pt c/o lower abdominal pain. MD made aware. 1 mg Dilaudid given for pain. Will continue to monitor.

## 2012-10-08 NOTE — H&P (Signed)
Chief Complaint: Symptomatic Tachycardia  HPI: the patient is a 62 y/o AAF  female who has multiple medical problems and in 2008 underwent CABG surgery x 4.  She also has had recurrent pancreatitis with pancreatic pseudocyst,  history of esophageal varices at [facility], cirrhosis, as well as CHF. She has end-stage renal disease, on dialysis. Apparently in 2008, she was found have a stenosis in the distal aspect of her LIMA graft and apparently underwent stenting of her native LAD. It was felt that her graft to her diagonal was occluded at that time. Apparently, the patient is also status post ICD at the [facility]. She was admitted to [facility] in November of 2013 for a NSTEMI and underwent a LHC by Dr. Tresa Endo, which revealed the SVG-RCA and SVG to DX to be patent. The SVG-DX was moderately atertic and was very small. The LIMA - LAD was patent with 60% LAD disease post anastomosis. The LAD stent was patent. The RCA was 80% proximal and 60 and 90% ostial RV branches. Her EF was 20-25%.  It was decided to treat medically. She reports that she has not had any chest pain since her last cath. She  presented to the [facility] today with a complaint of SOB, weakness and fatigue. She had just finished dialysis prior to developing symptoms. In the ED, she was found to be in SVT with a HR of 180's. She was borderline hypotensive with a SBP in the mid 90's. She was cardioverted with 50 joules. She tolerated it well. She continues in NSR. She is now normotensive. She is resting and w/o complaints. SOB has resolved. She continues to deny chest pain put is endorsing right arm pain.   Past Medical History  Diagnosis Date  . Coronary artery disease     s/p CABG 2008 with multiple PCIs  . Ischemic cardiomyopathy     Cath 04/2012, significant calcifications  . History of nonadherence to medical treatment   . Systolic CHF     16/1096 echo EF 10%  . Esophageal varices   . Cirrhosis     hx of fatty liver per  patient, has known ascites with repeated paracenteses at [facility] as of 2013, also +hx of esoph varices with banding in the past  . Pacemaker   . ICD (implantable cardiac defibrillator) in place   . Hypertension     "used to have HTN; now I'm low" (05/23/2012)  . NSTEMI (non-ST elevated myocardial infarction)     04/2012; Trop peaked to 1.39; 05/23/2012 pt denies ever having MI  . Pneumonia 02/2012    "first time ever" (05/23/2012)  . Chronic bronchitis 1970's thru 2002    "went away when I stopped smoking in 2002" (05/23/2012)  . Diabetes mellitus     Diet controlled  . History of blood transfusion     "alot of them" (05/23/2012)  . Iron (Fe) deficiency anemia     "severe" (05/23/2012)  . Pancreatitis, chronic 10/04/2011    Had severe gallstone pancreatitis in May 2003, no surgery, treated medically then returned in June 2003 for cholecystectomy and cyst gastrostomy for pancreatic pseudocyst. In March 2013 was admitted for hemorrhage into psueodcyst. In April 2013 was treated at [facility] for polymicrobial bacteremia (fungal, MRSA and enterobacter) felt to be due to ruptured pseudocyst, treated medically. Admitted Jan 2014 with abd pain and had gas and fluid in pancreatic bed. She refused surgery ("would not make it") and had fluid aspirated by IR- gram stain showed yeast  and cultures grew enterococcus species, sensitive to amp and vanc. She was seen by ID and discharged on IV vanc/fortaz with HD and po voriconazole.     [name] ESRD on hemodialysis 05/06/2012    T,Th,Sat HD by [employer] [facility] on [address]. Started hemodialysis around July 2013.    [name] CHF (congestive heart failure)   . Complication of vascular access for dialysis 07/13/2012    L arm AVG (placed by Dr. Lyda Perone @ [facility]) was ligated 1/6 after significant L arm swelling secondary to L SCV stenosis and pacemaker; new access to be placed at right arm on 1/29 by Dr. Hollace Hayward      Past Surgical History  Procedure Laterality Date  .  Esophagogastroduodenoscopy  09/15/2011    Procedure: ESOPHAGOGASTRODUODENOSCOPY (EGD);  Surgeon: Theda Belfast, MD;  Location: [facility] ENDOSCOPY;  Service: Endoscopy;  Laterality: N/A;  . Cholecystectomy  2003  . Tonsillectomy and adenoidectomy  1961  . Appendectomy  1973?  [name] Vaginal hysterectomy  1973?  [name] Tubal ligation  1972  . Dilation and curettage of uterus      "bunch from profuse bleeding in the 1970's" (05/23/2012)  . Coronary artery bypass graft  2008    CABG X4  . Coronary angioplasty with stent placement  2008    "1; day after CABG" (05/23/2012)  . Cardiac catheterization      "before 2008 and 3 wk ago" (05/23/2012)  . Insert / replace / remove pacemaker  06/2011    pacemaker ICD  . Cardiac defibrillator placement  06/2011  . Arteriovenous graft placement  03/2012    right antecub  . Insertion of dialysis catheter  ~ 10/2011    right chest  . Peritoneal catheter insertion  08/2011  . Peritoneal catheter removal  ~ 10/2011    Family History  Problem Relation Age of Onset  . Coronary artery disease Mother   . Hypertension Mother   . Diabetes Mother   . Diabetes Sister   . Anesthesia problems Neg Hx    Social History:  reports that she quit smoking about 12 years ago. Her smoking use included Cigarettes. She has a 10.23 pack-year smoking history. She has never used smokeless tobacco. She reports that she does not drink alcohol or use illicit drugs.  Allergies:  Allergies  Allergen Reactions  . Codeine Itching  . Morphine And Related Shortness Of Breath    SOB when given IV once "to me it was mild; I called out fast for help; never had to intubate" (05/23/2012)  . Ace Inhibitors Other (See Comments)    Unknown reaction but her physician stated that she can't take it (specifically lisinopril)  . Tylenol (Acetaminophen) Other (See Comments)    Patient states that the doctor told her she has a Fatty Liver.    Prior to Admission medications   Medication Sig Start Date End Date  Taking? Authorizing Provider  aspirin EC 81 MG EC tablet Take 1 tablet (81 mg total) by mouth daily. 05/11/12  Yes Larey Seat, MD  cinacalcet (SENSIPAR) 30 MG tablet Take 2 tablets (60 mg total) by mouth daily with breakfast. 09/03/12  Yes Elease Etienne, MD  dextrose (GLUTOSE) 40 % GEL Take 37.5 g by mouth as needed (CBG less than 70 or CBG greater than 70 and next meal is more than 1 hours away). 09/03/12  Yes Elease Etienne, MD  docusate sodium 100 MG CAPS Take 100 mg by mouth 2 (two) times daily. 09/03/12  Yes Elease Etienne, MD  HYDROmorphone (DILAUDID) 2 MG tablet Take 1 tablet (2 mg total) by mouth every 6 (six) hours as needed (severe pain.). For pain 09/03/12  Yes Elease Etienne, MD  methadone (DOLOPHINE) 5 MG tablet Take 1 tablet (5 mg total) by mouth 4 (four) times daily. 09/03/12  Yes Elease Etienne, MD  multivitamin (RENA-VIT) TABS tablet Take 1 tablet by mouth daily. 10/14/11  Yes Clanford Cyndie Mull, MD  Nutritional Supplements (FEEDING SUPPLEMENT, NEPRO CARB STEADY,) LIQD Take 237 mLs by mouth 2 (two) times daily with breakfast and lunch. 03/04/12  Yes Srikar Cherlynn Kaiser, MD  pantoprazole (PROTONIX) 40 MG tablet Take 40 mg by mouth daily.   Yes Historical Provider, MD  pravastatin (PRAVACHOL) 20 MG tablet Take 20 mg by mouth at bedtime.    Yes Historical Provider, MD  ranolazine (RANEXA) 500 MG 12 hr tablet Take 500 mg by mouth daily.    Yes Historical Provider, MD  sevelamer (RENAGEL) 400 MG tablet Take 1 tablet (400 mg total) by mouth 3 (three) times daily with meals. With a meal 09/03/12  Yes Elease Etienne, MD     Results for orders placed during the hospital encounter of 10/08/12 (from the past 48 hour(s))  MAGNESIUM     Status: None   Collection Time    10/08/12  7:22 PM      Result Value Range   Magnesium 2.2  1.5 - 2.5 mg/dL  CBC WITH DIFFERENTIAL     Status: Abnormal   Collection Time    10/08/12  7:35 PM      Result Value Range   WBC 13.8 (*) 4.0 - 10.5 K/uL    RBC 2.96 (*) 3.87 - 5.11 MIL/uL   Hemoglobin 8.8 (*) 12.0 - 15.0 g/dL   HCT 16.1 (*) 09.6 - 04.5 %   MCV 87.2  78.0 - 100.0 fL   MCH 29.7  26.0 - 34.0 pg   MCHC 34.1  30.0 - 36.0 g/dL   RDW 40.9 (*) 81.1 - 91.4 %   Platelets 120 (*) 150 - 400 K/uL   Neutrophils Relative 92 (*) 43 - 77 %   Neutro Abs 12.6 (*) 1.7 - 7.7 K/uL   Lymphocytes Relative 3 (*) 12 - 46 %   Lymphs Abs 0.3 (*) 0.7 - 4.0 K/uL   Monocytes Relative 6  3 - 12 %   Monocytes Absolute 0.8  0.1 - 1.0 K/uL   Eosinophils Relative 0  0 - 5 %   Eosinophils Absolute 0.0  0.0 - 0.7 K/uL   Basophils Relative 0  0 - 1 %   Basophils Absolute 0.0  0.0 - 0.1 K/uL  BASIC METABOLIC PANEL     Status: Abnormal   Collection Time    10/08/12  7:35 PM      Result Value Range   Sodium 138  135 - 145 mEq/L   Potassium 3.5  3.5 - 5.1 mEq/L   Chloride 99  96 - 112 mEq/L   CO2 29  19 - 32 mEq/L   Glucose, Bld 154 (*) 70 - 99 mg/dL   BUN 17  6 - 23 mg/dL   Creatinine, Ser 7.82 (*) 0.50 - 1.10 mg/dL   Calcium 9.1  8.4 - 95.6 mg/dL   GFR calc non Af Amer 44 (*) >90 mL/min   GFR calc Af Amer 50 (*) >90 mL/min   Comment:  The eGFR has been calculated     using the CKD EPI equation.     This calculation has not been     validated in all clinical     situations.     eGFR's persistently     <90 mL/min signify     possible Chronic Kidney Disease.  TROPONIN I     Status: Abnormal   Collection Time    10/08/12  7:35 PM      Result Value Range   Troponin I 5.67 (*) <0.30 ng/mL   Comment:            Due to the release kinetics of cTnI,     a negative result within the first hours     of the onset of symptoms does not rule out     myocardial infarction with certainty.     If myocardial infarction is still suspected,     repeat the test at appropriate intervals.     CRITICAL RESULT CALLED TO, READ BACK BY AND VERIFIED WITH:     K.[facility] 2035 10/08/12 EHOWARD   Dg Chest Port 1 View  10/08/2012  *RADIOLOGY REPORT*  Clinical  Data: Shortness of breath  PORTABLE CHEST - 1 VIEW  Comparison: 08/19/2012  Findings: Pacing pad are noted over the chest, obscuring detail. Left-sided AICD in place.  Right IJ approach hemodialysis catheter tips over the right atrium.  Moderate enlargement of the cardiomediastinal silhouette again noted with central vascular congestion and hazy perihilar airspace opacity.  Trace right pleural effusion.  Findings are not significantly changed. Evidence of CABG again noted.  IMPRESSION: Stable findings of cardiomegaly with hazy pulmonary opacities and right pleural effusion that could indicate pulmonary edema.   Original Report Authenticated By: Christiana Pellant, M.D.     Review of Systems  Constitutional: Positive for malaise/fatigue. Negative for fever, chills and diaphoresis.  Respiratory: Positive for shortness of breath.   Cardiovascular: Positive for leg swelling. Negative for chest pain and palpitations.  Gastrointestinal: Positive for nausea and vomiting.  Neurological: Positive for weakness. Negative for loss of consciousness.    Blood pressure 110/84, pulse 99, temperature 100.2 F (37.9 C), temperature source Oral, resp. rate 23, SpO2 99.00%. Physical Exam  Constitutional: She is oriented to person, place, and time. No distress.  Frail appearing, appears older than stated age   Cardiovascular: Regular rhythm and normal heart sounds.  Tachycardia present.  Exam reveals no gallop and no friction rub.   No murmur heard. Pulses:      Radial pulses are 2+ on the right side, and 2+ on the left side.       Dorsalis pedis pulses are 1+ on the right side, and 1+ on the left side.  Respiratory: Effort normal. No respiratory distress. She has no wheezes. She has rales (RLL).  GI: Soft. Bowel sounds are normal. She exhibits no distension and no mass. There is no tenderness.  Musculoskeletal: She exhibits edema (bilateral extemities).  Neurological: She is alert and oriented to person, place, and  time.  Skin: Skin is warm and dry. She is not diaphoretic.  Psychiatric: She has a normal mood and affect. Her behavior is normal.     Assessment/Plan Principal Problem:   SVT (supraventricular tachycardia) Active Problems:   Hypotension   Ischemic cardiomyopathy, EF 20-25% echo April 2013, now 10% by cath (05/06/12)   Chronic systolic congestive heart failure, NYHA class 3   Pancreatitis, chronic   Esophageal varices in cirrhosis, admitted June 2013  to [facility]   ESRD on hemodialysis   CAD, CABG X 4 Feb '08, LAD stent June '08   ICD (implantable cardiac defibrillator) in place, MDT placed Jan 2013 [facility]  Plan: Pt remains in [place] after cardioversion. BP is now stable. Pt has rales on exam + LEE. CXR shows cardiomegaly with hazy pulmonary opacities and right pleural effusion that could indicate pulmonary edema. Will get a BNP. Consider giving IV Lasix. SCr is 1.28.  Troponin is elevated at 5.67, in the setting of CKD. EKG shows a LBBB, but this is apprently not new. No acute changes.  Plan to admit for observation and possible diuresis with Lasix. MD to follow with further recommendation.   Allayne Butcher, PA-C 10/08/2012, 10:44 PM   Patient seen and examined. Agree with assessment and plan. 62 yo AAF with h/o ischemic cardiomyopathy, s/p CABG 2008, EF 20%,  s/p ICD. She has ESRD on hemodialysis, cirrhosis with esophageal varices who presented with WCT and received shock in ER with restoration of sinus rhythm. She denies associated chest pain, but has noted vague right arm discomfort.  Tro 5.67. She now feels improved. Will admit to 2600.Will add carvedilol.Suspect SVT by morphology of tachycardia but with markedly reduced EF substrate for VT is significant.   Lennette Bihari, MD, [facility] 10/09/2012 12:58 AM

## 2012-10-08 NOTE — ED Notes (Signed)
Pt sitting up in chair, Dr. Lovell Sheehan in to assess pt for admission

## 2012-10-08 NOTE — ED Notes (Signed)
Pt resting with no complaints

## 2012-10-08 NOTE — ED Provider Notes (Signed)
History     CSN: [id number]  Arrival date & time 10/08/12  1730   First MD Initiated Contact with Patient 10/08/12 1733      No chief complaint on file.   (Consider location/radiation/quality/duration/timing/severity/associated sxs/prior treatment) HPI Comments: 62 y F with multiple co-morbidities including systolic CHF (last EF 20%), cirrhosis with varices and ESRD on TTS HD here for tachycardia in 180s noted at dialysis. She reports left sided CP since onset and one episode of NB/NB emesis.    Patient is a 62 y.o. female presenting with chest pain. The history is provided by the patient.  Chest Pain Pain location:  L chest Pain quality: aching   Pain radiates to:  Does not radiate Pain severity:  Moderate Onset quality:  Sudden Timing:  Constant Progression:  Unchanged Chronicity:  New Context: not breathing   Ineffective treatments:  None tried Associated symptoms: no abdominal pain, no cough, no fever, no nausea, no shortness of breath and not vomiting     Past Medical History  Diagnosis Date  . Coronary artery disease     s/p CABG 2008 with multiple PCIs  . Ischemic cardiomyopathy     Cath 04/2012, significant calcifications  . History of nonadherence to medical treatment   . Systolic CHF     46/9629 echo EF 10%  . Esophageal varices   . Cirrhosis     hx of fatty liver per patient, has known ascites with repeated paracenteses at [facility] as of 2013, also +hx of esoph varices with banding in the past  . Pacemaker   . ICD (implantable cardiac defibrillator) in place   . Hypertension     "used to have HTN; now I'm low" (05/23/2012)  . NSTEMI (non-ST elevated myocardial infarction)     04/2012; Trop peaked to 1.39; 05/23/2012 pt denies ever having MI  . Pneumonia 02/2012    "first time ever" (05/23/2012)  . Chronic bronchitis 1970's thru 2002    "went away when I stopped smoking in 2002" (05/23/2012)  . Diabetes mellitus     Diet controlled  . History of blood  transfusion     "alot of them" (05/23/2012)  . Iron (Fe) deficiency anemia     "severe" (05/23/2012)  . Pancreatitis, chronic 10/04/2011    Had severe gallstone pancreatitis in May 2003, no surgery, treated medically then returned in June 2003 for cholecystectomy and cyst gastrostomy for pancreatic pseudocyst. In March 2013 was admitted for hemorrhage into psueodcyst. In April 2013 was treated at [facility] for polymicrobial bacteremia (fungal, MRSA and enterobacter) felt to be due to ruptured pseudocyst, treated medically. Admitted Jan 2014 with abd pain and had gas and fluid in pancreatic bed. She refused surgery ("would not make it") and had fluid aspirated by IR- gram stain showed yeast and cultures grew enterococcus species, sensitive to amp and vanc. She was seen by ID and discharged on IV vanc/fortaz with HD and po voriconazole.     [name] ESRD on hemodialysis 05/06/2012    T,Th,Sat HD by [facility] on [address]. Started hemodialysis around July 2013.    [name] CHF (congestive heart failure)   . Complication of vascular access for dialysis 07/13/2012    L arm AVG (placed by Dr. Lyda Perone @ [facility]) was ligated 1/6 after significant L arm swelling secondary to L SCV stenosis and pacemaker; new access to be placed at right arm on 1/29 by Dr. Hollace Hayward      Past Surgical History  Procedure Laterality Date  .  Esophagogastroduodenoscopy  09/15/2011    Procedure: ESOPHAGOGASTRODUODENOSCOPY (EGD);  Surgeon: Theda Belfast, MD;  Location: [facility] ENDOSCOPY;  Service: Endoscopy;  Laterality: N/A;  . Cholecystectomy  2003  . Tonsillectomy and adenoidectomy  1961  . Appendectomy  1973?  [name] Vaginal hysterectomy  1973?  [name] Tubal ligation  1972  . Dilation and curettage of uterus      "bunch from profuse bleeding in the 1970's" (05/23/2012)  . Coronary artery bypass graft  2008    CABG X4  . Coronary angioplasty with stent placement  2008    "1; day after CABG" (05/23/2012)  . Cardiac catheterization       "before 2008 and 3 wk ago" (05/23/2012)  . Insert / replace / remove pacemaker  06/2011    pacemaker ICD  . Cardiac defibrillator placement  06/2011  . Arteriovenous graft placement  03/2012    right antecub  . Insertion of dialysis catheter  ~ 10/2011    right chest  . Peritoneal catheter insertion  08/2011  . Peritoneal catheter removal  ~ 10/2011    Family History  Problem Relation Age of Onset  . Coronary artery disease Mother   . Hypertension Mother   . Diabetes Mother   . Diabetes Sister   . Anesthesia problems Neg Hx     History  Substance Use Topics  . Smoking status: Former Smoker -- 0.33 packs/day for 31 years    Types: Cigarettes    Quit date: 06/15/2000  . Smokeless tobacco: Never Used  . Alcohol Use: No     Comment: 05/23/2012 "drank from age [age]; never had problem w/it"    OB History   Grav Para Term Preterm Abortions TAB SAB Ect Mult Living                  Review of Systems  Constitutional: Negative for fever and chills.  Respiratory: Negative for cough and shortness of breath.   Cardiovascular: Positive for chest pain.  Gastrointestinal: Negative for nausea, vomiting, abdominal pain and diarrhea.  Genitourinary: Negative for dysuria.  All other systems reviewed and are negative.    Allergies  Codeine; Morphine and related; Ace inhibitors; and Tylenol  Home Medications   Current Outpatient Rx  Name  Route  Sig  Dispense  Refill  . aspirin EC 81 MG EC tablet   Oral   Take 1 tablet (81 mg total) by mouth daily.   30 tablet   0   . cinacalcet (SENSIPAR) 30 MG tablet   Oral   Take 2 tablets (60 mg total) by mouth daily with breakfast.   30 tablet   0   . dextrose (GLUTOSE) 40 % GEL   Oral   Take 37.5 g by mouth as needed (CBG less than 70 or CBG greater than 70 and next meal is more than 1 hours away).         . docusate sodium 100 MG CAPS   Oral   Take 100 mg by mouth 2 (two) times daily.         [name] HYDROmorphone (DILAUDID) 2 MG  tablet   Oral   Take 1 tablet (2 mg total) by mouth every 6 (six) hours as needed (severe pain.). For pain   10 tablet   0   . methadone (DOLOPHINE) 5 MG tablet   Oral   Take 1 tablet (5 mg total) by mouth 4 (four) times daily.   10 tablet   0   .  multivitamin (RENA-VIT) TABS tablet   Oral   Take 1 tablet by mouth daily.         . Nutritional Supplements (FEEDING SUPPLEMENT, NEPRO CARB STEADY,) LIQD   Oral   Take 237 mLs by mouth 2 (two) times daily with breakfast and lunch.         . pantoprazole (PROTONIX) 40 MG tablet   Oral   Take 40 mg by mouth daily.         . pravastatin (PRAVACHOL) 20 MG tablet   Oral   Take 20 mg by mouth at bedtime.          . ranolazine (RANEXA) 500 MG 12 hr tablet   Oral   Take 500 mg by mouth daily.          . sevelamer (RENAGEL) 400 MG tablet   Oral   Take 1 tablet (400 mg total) by mouth 3 (three) times daily with meals. With a meal   90 tablet   0     BP 97/71  Temp(Src) 98.2 F (36.8 C) (Oral)  Resp 31  SpO2 100% HR 186  Physical Exam  Vitals reviewed. Constitutional: She is oriented to person, place, and time. She appears well-developed and well-nourished.  HENT:  Right Ear: External ear normal.  Left Ear: External ear normal.  Mouth/Throat: No oropharyngeal exudate.  Eyes: Conjunctivae and EOM are normal. Pupils are equal, round, and reactive to light.  Neck: Normal range of motion. Neck supple.  Cardiovascular: Regular rhythm, normal heart sounds and intact distal pulses.  Exam reveals no gallop and no friction rub.   No murmur heard. tachycardic  Pulmonary/Chest: Effort normal and breath sounds normal.  Abdominal: Soft. Bowel sounds are normal. She exhibits no distension. There is no tenderness.  Musculoskeletal: Normal range of motion. She exhibits no edema.  Neurological: She is alert and oriented to person, place, and time. No cranial nerve deficit.  Skin: Skin is warm and dry. No rash noted.   Psychiatric: She has a normal mood and affect.    ED Course  Procedures (including critical care time)  Labs Reviewed  CBC WITH DIFFERENTIAL - Abnormal; Notable for the following:    WBC 13.8 (*)    RBC 2.96 (*)    Hemoglobin 8.8 (*)    HCT 25.8 (*)    RDW 17.5 (*)    Platelets 120 (*)    Neutrophils Relative 92 (*)    Neutro Abs 12.6 (*)    Lymphocytes Relative 3 (*)    Lymphs Abs 0.3 (*)    All other components within normal limits  BASIC METABOLIC PANEL - Abnormal; Notable for the following:    Glucose, Bld 154 (*)    Creatinine, Ser 1.28 (*)    GFR calc non Af Amer 44 (*)    GFR calc Af Amer 50 (*)    All other components within normal limits  TROPONIN I - Abnormal; Notable for the following:    Troponin I 5.67 (*)    All other components within normal limits  MAGNESIUM  PRO B NATRIURETIC PEPTIDE   Dg Chest Port 1 View  10/08/2012  *RADIOLOGY REPORT*  Clinical Data: Shortness of breath  PORTABLE CHEST - 1 VIEW  Comparison: 08/19/2012  Findings: Pacing pad are noted over the chest, obscuring detail. Left-sided AICD in place.  Right IJ approach hemodialysis catheter tips over the right atrium.  Moderate enlargement of the cardiomediastinal silhouette again noted with central vascular congestion and hazy  perihilar airspace opacity.  Trace right pleural effusion.  Findings are not significantly changed. Evidence of CABG again noted.  IMPRESSION: Stable findings of cardiomegaly with hazy pulmonary opacities and right pleural effusion that could indicate pulmonary edema.   Original Report Authenticated By: Christiana Pellant, M.D.      Date: 10/08/2012  Rate: 185  Rhythm: wide complex regular tachycardia  QRS Axis: left  Intervals: normal  ST/T Wave abnormalities: ST depressions laterally  Conduction Disutrbances:wide complex  Narrative Interpretation: Wide complex tachycardia, new compared to prior EKG 08/13/2012  Old EKG Reviewed: changes noted   Date: 10/08/2012  Rate:  109  Rhythm: sinus tachycardia  QRS Axis: left  Intervals: QT prolonged  ST/T Wave abnormalities: normal  Conduction Disutrbances:left bundle branch block  Narrative Interpretation: Rate has decreased to 109 from 185, STD seen in lateral leads has resolved  Old EKG Reviewed: changes noted    1. Wide-complex tachycardia   2. Hypotension   3. Elevated troponin       MDM   16 y F with multiple co-morbidities including systolic CHF (last EF 20%), cirrhosis with varices and ESRD on TTS HD here for tachycardia in 180s noted at dialysis. She reports left sided CP since onset and one episode of NB/NB emesis.  BPs low for her, 90s/60s and c/o CP so preparation made for cardioversion.  EKG with wide complex tachycardia - prior EKG with iLBBB pattern so it is unknown whether this is SVT or V tach.  1 CV x 50 J was successful with HR improved to 110s and symptoms improved.    CBC, BMP, Mg, Trop, XR chest.  7:29 PM Pt HDS and CP free, HR around 110.  Repeat EKG with LBBB pattern.  No significant changes from prior EKGs.  12:05 AM Pt has remained HDS.  Cards consulted.  ASA given.  Trop 5, but elevation likely 2/2 ESRD.  Do not feel that systemic anticoagulation is indicated as EKG is without acute changes and pt is CP free.  Medicine consulted for admission.  Disposition: Admit  Condition: Stable  Pt seen in conjunction with my attending, Dr. Blinda Leatherwood.  Oleh Genin, MD PGY-II [facility] Emergency Medicine Resident   Oleh Genin, MD 10/09/12 [phone]

## 2012-10-08 NOTE — ED Notes (Signed)
Pt brought to ED from dialysis center via [facility] with c/o rapid heartbeat.  Pt was finished with dialysis and was outside waiting for ride home when she started feeling weak and short of breath.  On arrival to ED pt only c/o pain to right arm.  Cardiac monitor showing SVT rate of 180's.  Pt alert and oriented x's 3.

## 2012-10-08 NOTE — ED Notes (Signed)
Pt was cardioverted with 50j and tolerated well.

## 2012-10-08 NOTE — Progress Notes (Signed)
ANTICOAGULATION CONSULT NOTE - Initial Consult  Pharmacy Consult for heparin Indication: chest pain/ACS  Allergies  Allergen Reactions  . Codeine Itching  . Morphine And Related Shortness Of Breath    SOB when given IV once "to me it was mild; I called out fast for help; never had to intubate" (05/23/2012)  . Ace Inhibitors Other (See Comments)    Unknown reaction but her physician stated that she can't take it (specifically lisinopril)  . Tylenol (Acetaminophen) Other (See Comments)    Patient states that the doctor told her she has a Fatty Liver.    Patient Measurements: Height: 5\' 4"  (162.6 cm) Weight: 100 lb 5 oz (45.5 kg) IBW/kg (Calculated) : 54.7  Vital Signs: Temp: 100.2 F (37.9 C) (04/26 1920) Temp src: Oral (04/26 1920) BP: 110/84 mmHg (04/26 2136) Pulse Rate: 99 (04/26 2145)  Labs:  Recent Labs  10/08/12 1935  HGB 8.8*  HCT 25.8*  PLT 120*  CREATININE 1.28*  TROPONINI 5.67*    Estimated Creatinine Clearance: 32.3 ml/min (by C-G formula based on Cr of 1.28).   Medical History: Past Medical History  Diagnosis Date  . Coronary artery disease     s/p CABG 2008 with multiple PCIs  . Ischemic cardiomyopathy     Cath 04/2012, significant calcifications  . History of nonadherence to medical treatment   . Systolic CHF     16/1096 echo EF 10%  . Esophageal varices   . Cirrhosis     hx of fatty liver per patient, has known ascites with repeated paracenteses at [facility] as of 2013, also +hx of esoph varices with banding in the past  . Pacemaker   . ICD (implantable cardiac defibrillator) in place   . Hypertension     "used to have HTN; now I'm low" (05/23/2012)  . NSTEMI (non-ST elevated myocardial infarction)     04/2012; Trop peaked to 1.39; 05/23/2012 pt denies ever having MI  . Pneumonia 02/2012    "first time ever" (05/23/2012)  . Chronic bronchitis 1970's thru 2002    "went away when I stopped smoking in 2002" (05/23/2012)  . Diabetes mellitus     Diet  controlled  . History of blood transfusion     "alot of them" (05/23/2012)  . Iron (Fe) deficiency anemia     "severe" (05/23/2012)  . Pancreatitis, chronic 10/04/2011    Had severe gallstone pancreatitis in May 2003, no surgery, treated medically then returned in June 2003 for cholecystectomy and cyst gastrostomy for pancreatic pseudocyst. In March 2013 was admitted for hemorrhage into psueodcyst. In April 2013 was treated at [facility] for polymicrobial bacteremia (fungal, MRSA and enterobacter) felt to be due to ruptured pseudocyst, treated medically. Admitted Jan 2014 with abd pain and had gas and fluid in pancreatic bed. She refused surgery ("would not make it") and had fluid aspirated by IR- gram stain showed yeast and cultures grew enterococcus species, sensitive to amp and vanc. She was seen by ID and discharged on IV vanc/fortaz with HD and po voriconazole.     [name] ESRD on hemodialysis 05/06/2012    T,Th,Sat HD by [facility] on [address]. Started hemodialysis around July 2013.    [name] CHF (congestive heart failure)   . Complication of vascular access for dialysis 07/13/2012    L arm AVG (placed by Dr. Lyda Perone @ [facility]) was ligated 1/6 after significant L arm swelling secondary to L SCV stenosis and pacemaker; new access to be placed at right arm on  1/29 by Dr. Hollace Hayward       Assessment: 62yo female came to ED from HD where pt was experiencing weakness and arm pain though denies CP, found to be in SVT, converted and now in NSR, troponin elevated, to begin heparin.  Goal of Therapy:  Heparin level 0.3-0.7 units/ml Monitor platelets by anticoagulation protocol: Yes   Plan:  Will give heparin bolus of 2000 units followed by gtt at 600 units/hr and monitor heparin levels and CBC.  Vernard Gambles, PharmD, BCPS  10/08/2012,11:13 PM

## 2012-10-08 NOTE — ED Notes (Signed)
Pt sitting on side of stretcher no complaints voiced.  Pt denies any pain at this time

## 2012-10-09 ENCOUNTER — Encounter (HOSPITAL_COMMUNITY): Payer: Self-pay | Admitting: *Deleted

## 2012-10-09 DIAGNOSIS — I214 Non-ST elevation (NSTEMI) myocardial infarction: Secondary | ICD-10-CM

## 2012-10-09 DIAGNOSIS — K861 Other chronic pancreatitis: Secondary | ICD-10-CM

## 2012-10-09 DIAGNOSIS — M6789 Other specified disorders of synovium and tendon, multiple sites: Secondary | ICD-10-CM

## 2012-10-09 DIAGNOSIS — N186 End stage renal disease: Secondary | ICD-10-CM

## 2012-10-09 DIAGNOSIS — I10 Essential (primary) hypertension: Secondary | ICD-10-CM

## 2012-10-09 DIAGNOSIS — I5022 Chronic systolic (congestive) heart failure: Secondary | ICD-10-CM

## 2012-10-09 LAB — CBC
Hemoglobin: 9.6 g/dL — ABNORMAL LOW (ref 12.0–15.0)
MCHC: 33 g/dL (ref 30.0–36.0)
Platelets: 118 10*3/uL — ABNORMAL LOW (ref 150–400)
RBC: 3.11 MIL/uL — ABNORMAL LOW (ref 3.87–5.11)
RBC: 3.23 MIL/uL — ABNORMAL LOW (ref 3.87–5.11)
WBC: 8.8 10*3/uL (ref 4.0–10.5)

## 2012-10-09 LAB — PRO B NATRIURETIC PEPTIDE: Pro B Natriuretic peptide (BNP): >70000 pg/mL — ABNORMAL HIGH (ref 0–125)

## 2012-10-09 LAB — BASIC METABOLIC PANEL
Calcium: 10.1 mg/dL (ref 8.4–10.5)
GFR calc Af Amer: 34 mL/min — ABNORMAL LOW (ref >90)
GFR calc non Af Amer: 30 mL/min — ABNORMAL LOW (ref >90)
Sodium: 137 mEq/L (ref 135–145)

## 2012-10-09 LAB — TSH: TSH: 2.582 u[IU]/mL (ref 0.350–4.500)

## 2012-10-09 LAB — HEPARIN LEVEL (UNFRACTIONATED): Heparin Unfractionated: <0.1 IU/mL — ABNORMAL LOW (ref 0.30–0.70)

## 2012-10-09 LAB — TROPONIN I
Troponin I: 12.42 ng/mL (ref <0.30)
Troponin I: 8.7 ng/mL (ref <0.30)

## 2012-10-09 LAB — MRSA PCR SCREENING: MRSA by PCR: NEGATIVE

## 2012-10-09 LAB — GLUCOSE, CAPILLARY: Glucose-Capillary: 110 mg/dL — ABNORMAL HIGH (ref 70–99)

## 2012-10-09 MED ORDER — CARVEDILOL 3.125 MG PO TABS
4.6875 mg | ORAL_TABLET | Freq: Two times a day (BID) | ORAL | Status: DC
  Administered 2012-10-09 – 2012-10-12 (×5): 4.6875 mg via ORAL
  Filled 2012-10-09 (×10): qty 1.5

## 2012-10-09 MED ORDER — GUAIFENESIN 100 MG/5ML PO SOLN
200.0000 mg | ORAL | Status: DC | PRN
  Administered 2012-10-09 – 2012-10-12 (×7): 200 mg via ORAL
  Filled 2012-10-09 (×8): qty 10

## 2012-10-09 MED ORDER — SODIUM CHLORIDE 0.9 % IJ SOLN
10.0000 mL | INTRAMUSCULAR | Status: DC | PRN
  Administered 2012-10-09: 40 mL
  Administered 2012-10-11 – 2012-10-13 (×3): 20 mL

## 2012-10-09 MED ORDER — HEPARIN BOLUS VIA INFUSION
1000.0000 [IU] | Freq: Once | INTRAVENOUS | Status: AC
  Administered 2012-10-09: 1000 [IU] via INTRAVENOUS

## 2012-10-09 MED ORDER — RENA-VITE PO TABS
1.0000 | ORAL_TABLET | Freq: Every day | ORAL | Status: DC
  Administered 2012-10-10 – 2012-10-12 (×3): 1 via ORAL
  Filled 2012-10-09 (×5): qty 1

## 2012-10-09 MED ORDER — CARVEDILOL 3.125 MG PO TABS
3.1250 mg | ORAL_TABLET | Freq: Two times a day (BID) | ORAL | Status: DC
  Administered 2012-10-09: 3.125 mg via ORAL
  Filled 2012-10-09 (×3): qty 1

## 2012-10-09 MED ORDER — HEPARIN BOLUS VIA INFUSION
1500.0000 [IU] | Freq: Once | INTRAVENOUS | Status: AC
  Administered 2012-10-09: 1500 [IU] via INTRAVENOUS
  Filled 2012-10-09: qty 1500

## 2012-10-09 MED ORDER — SODIUM CHLORIDE 0.9 % IJ SOLN
10.0000 mL | Freq: Two times a day (BID) | INTRAMUSCULAR | Status: DC
  Administered 2012-10-09 – 2012-10-10 (×3): 10 mL via INTRAVENOUS

## 2012-10-09 MED ORDER — CARVEDILOL 6.25 MG PO TABS
6.2500 mg | ORAL_TABLET | Freq: Two times a day (BID) | ORAL | Status: DC
  Filled 2012-10-09 (×2): qty 1

## 2012-10-09 MED ORDER — AMIODARONE HCL 200 MG PO TABS
200.0000 mg | ORAL_TABLET | Freq: Two times a day (BID) | ORAL | Status: DC
  Administered 2012-10-09 – 2012-10-13 (×9): 200 mg via ORAL
  Filled 2012-10-09 (×11): qty 1

## 2012-10-09 MED ORDER — GUAIFENESIN 100 MG/5ML PO SYRP
200.0000 mg | ORAL_SOLUTION | ORAL | Status: DC | PRN
  Filled 2012-10-09 (×2): qty 118

## 2012-10-09 NOTE — Progress Notes (Signed)
[facility]  Subjective: No complaints. Denies further SOB. No chest pain.  Objective: Vital signs in last 24 hours: Temp:  [97.7 F (36.5 C)-100.2 F (37.9 C)] 97.7 F (36.5 C) (04/27 0356) Pulse Rate:  [94-112] 101 (04/27 0356) Resp:  [14-31] 14 (04/27 0356) BP: (97-115)/(71-86) 97/75 mmHg (04/27 0356) SpO2:  [98 %-100 %] 99 % (04/27 0356) Weight:  [94 lb 12.8 oz (43 kg)-100 lb 5 oz (45.5 kg)] 94 lb 12.8 oz (43 kg) (04/27 0130) Last BM Date: 10/08/12  Intake/Output from previous day: 04/26 0701 - 04/27 0700 In: 241.4 [P.O.:120; I.V.:121.4] Out: -  Intake/Output this shift:    Medications Current Facility-Administered Medications  Medication Dose Route Frequency Provider Last Rate Last Dose  . 0.9 %  sodium chloride infusion  250 mL Intravenous PRN Ron Parker, MD      . acetaminophen (TYLENOL) tablet 650 mg  650 mg Oral Q6H PRN Ron Parker, MD       Or  . acetaminophen (TYLENOL) suppository 650 mg  650 mg Rectal Q6H PRN Ron Parker, MD      . ALPRAZolam Prudy Feeler) tablet 0.25 mg  0.25 mg Oral TID PRN Dorothy Simmons, PA-C      . aspirin EC tablet 81 mg  81 mg Oral Daily Dorothy Simmons, PA-C      . carvedilol (COREG) tablet 3.125 mg  3.125 mg Oral BID WC Dorothy Simmons, PA-C      . cinacalcet (SENSIPAR) tablet 60 mg  60 mg Oral Q breakfast Dorothy Simmons, PA-C      . dextrose (GLUTOSE) 40 % oral gel 37.5 g  1 Tube Oral PRN Dorothy Simmons, PA-C      . docusate sodium (COLACE) capsule 100 mg  100 mg Oral BID Dorothy Simmons, PA-C   100 mg at 10/09/12 0221  . feeding supplement (NEPRO CARB STEADY) liquid 237 mL  237 mL Oral BID WC Dorothy Simmons, PA-C      . heparin ADULT infusion 100 units/mL (25000 units/250 mL)  600 Units/hr Intravenous Continuous Colleen Can, RPH 6 mL/hr at 10/09/12 0046 600 Units/hr at 10/09/12 0046  . HYDROmorphone (DILAUDID) injection 0.5-1 mg  0.5-1 mg Intravenous Q3H PRN  Ron Parker, MD   1 mg at 10/09/12 0220  . HYDROmorphone (DILAUDID) tablet 2 mg  2 mg Oral Q6H PRN Dorothy Simmons, PA-C      . methadone (DOLOPHINE) tablet 5 mg  5 mg Oral QID Dorothy Simmons, PA-C   5 mg at 10/09/12 0221  . multivitamin (RENA-VIT) tablet 1 tablet  1 tablet Oral Daily Dorothy Simmons, PA-C      . nitroGLYCERIN (NITROGLYN) 2 % ointment 0.5 inch  0.5 inch Topical Q6H Ron Parker, MD   0.5 inch at 10/09/12 0600  . ondansetron (ZOFRAN) tablet 4 mg  4 mg Oral Q6H PRN Ron Parker, MD       Or  . ondansetron (ZOFRAN) injection 4 mg  4 mg Intravenous Q6H PRN Ron Parker, MD   4 mg at 10/09/12 0220  . oxyCODONE (Oxy IR/ROXICODONE) immediate release tablet 5 mg  5 mg Oral Q4H PRN Ron Parker, MD      . pantoprazole (PROTONIX) EC tablet 40 mg  40 mg Oral Daily Dorothy Simmons, PA-C      . ranolazine (RANEXA) 12 hr tablet 500 mg  500 mg Oral Daily Dorothy Simmons, PA-C      . sevelamer carbonate (RENVELA)  tablet 800 mg  800 mg Oral TID WC Dorothy Simmons, PA-C      . simvastatin (ZOCOR) tablet 5 mg  5 mg Oral q1800 Dorothy Simmons, PA-C      . sodium chloride 0.9 % injection 10-40 mL  10-40 mL Intracatheter PRN Harvette Velora Heckler, MD      . sodium chloride 0.9 % injection 3 mL  3 mL Intravenous Q12H Harvette C Jenkins, MD      . sodium chloride 0.9 % injection 3 mL  3 mL Intravenous PRN Ron Parker, MD      . zolpidem (AMBIEN) tablet 5 mg  5 mg Oral QHS PRN Ron Parker, MD        PE: General appearance: alert, cooperative, appears older than stated age, cachectic and no distress Lungs: + Rales in RLL Heart: regular rate and rhythm and 2/3 blowing murmur along LUSP Extremities: 2+ bilateral pitting edema Pulses: 2+ and symmetric Skin: warm and dry Neurologic: Grossly normal  Lab Results:   Recent Labs  10/08/12 1935 10/09/12 0701  WBC 13.8* 8.8  HGB 8.8* 9.2*  HCT 25.8* 27.8*  PLT 120* 118*   BMET  Recent  Labs  10/08/12 1935 10/09/12 0701  NA 138 137  K 3.5 3.5  CL 99 97  CO2 29 31  GLUCOSE 154* 117*  BUN 17 24*  CREATININE 1.28* 1.76*  CALCIUM 9.1 10.1   Cardiac Panel (last 3 results)  Recent Labs  10/08/12 1935  TROPONINI 5.67*    Assessment/Plan  Principal Problem:   SVT (supraventricular tachycardia) Active Problems:   Hypotension   Ischemic cardiomyopathy, EF 20-25% echo April 2013, now 10% by cath (05/06/12)   Chronic systolic congestive heart failure, NYHA class 3   LBBB (left bundle branch block)   Pancreatitis, chronic   Esophageal varices in cirrhosis, admitted June 2013 to [facility]   ESRD on hemodialysis   CAD, CABG X 4 Feb '08, LAD stent June '08   ICD (implantable cardiac defibrillator) in place, MDT placed Jan 2013 [facility]  Plan:  Pt admitted yesterday evening for symptomatic SVT/ ? VT. She converted to NSR with 1 shock of 50j. She is currently in SR and is borderline tachycardic w/ HR in the upper 90s-low 100s. She has had what looks like VT on telemetry. She has an ischemic cardiomyopathy and is at risk. She was started on a low dose of Coreg BID. Will increase BB and will consider adding amiodarone.  Her Mg was WNL at 2.2. TSH was ordered last PM and still pending. She denies any recurrence of symptoms. Will continue to keep inpatient. She receives HD Tues., Thur. ,Sat. If not discharged tomorrow, will need nephrology consult for IP HD.     LOS: 1 day    [name] 10/09/2012 8:22 AM  Patient seen and examined. Agree with assessment and plan. Feels much better. Telemetry reveals sinus rhythm at 100 with PAC's PVC's and has had burst of NSVT. EF has been reported as low as 10% last fall. Will add amiodarone at 200 mg bid, and try to titrate coreg to 4.1875 mg bid if BP can accommodate. F/U trop, and BNP.   Lennette Bihari, MD, [facility] 10/09/2012 9:10 AM

## 2012-10-09 NOTE — Progress Notes (Signed)
Troponin was rechecked today and was elevated at 14.39. I have notified Dr. Herbie Baltimore. Will plan to keep on IV heparin. She is chest pain free. Will get her ICD interrogated tomorrow am. Her ICD is by [employer]. Will continue to monitor.   Allayne Butcher, PA-C [facility]

## 2012-10-09 NOTE — Consult Note (Signed)
Reason for Consult: Continuity of ESRD care Referring Physician: Jorene Guest -[facility]   HPI:  62 year old African American woman with multiple medical problems including end-stage renal disease on hemodialysis on a Tuesday, Thursday and Saturday schedule who presented yesterday after completion of her whole dialysis treatment with a sensation of lightheadedness, shortness of breath and palpitations. Noted to have supraventricular tachycardia with hypertension and a heart rate of 180s and had cardioversion done in the emergency room which he tolerated and remains in sinus rhythm. She denies any chest pain/chest tightness/current shortness of breath and reports some right arm pain. Unfortunately ultrafiltration at hemodialysis has been limited by intradialytic hypotensive episodes from her severe ischemic cardiomyopathy-ejection fraction of about 10%. She denies any fevers or chills, denies any cough or sputum production. Was discharged from [facility] a few days ago, continues to have chronic melena.  Dialysis prescription: Limited data on [facility]. TTS, 4 hours, estimated dry weight 41 kg, erythropoietin 82956 units 3 times a week. Dialysis via right IJ tunneled dialysis catheter. No heparin, chronic GI bleed.  Past Medical History  Diagnosis Date  . Coronary artery disease     s/p CABG 2008 with multiple PCIs  . Ischemic cardiomyopathy     Cath 04/2012, significant calcifications  . History of nonadherence to medical treatment   . Systolic CHF     21/3086 echo EF 10%  . Esophageal varices   . Cirrhosis     hx of fatty liver per patient, has known ascites with repeated paracenteses at [facility] as of 2013, also +hx of esoph varices with banding in the past  . Pacemaker   . ICD (implantable cardiac defibrillator) in place   . Hypertension     "used to have HTN; now I'm low" (05/23/2012)  . NSTEMI (non-ST elevated myocardial infarction)     04/2012; Trop peaked to 1.39;  05/23/2012 pt denies ever having MI  . Pneumonia 02/2012    "first time ever" (05/23/2012)  . Chronic bronchitis 1970's thru 2002    "went away when I stopped smoking in 2002" (05/23/2012)  . Diabetes mellitus     Diet controlled  . History of blood transfusion     "alot of them" (05/23/2012)  . Iron (Fe) deficiency anemia     "severe" (05/23/2012)  . Pancreatitis, chronic 10/04/2011    Had severe gallstone pancreatitis in May 2003, no surgery, treated medically then returned in June 2003 for cholecystectomy and cyst gastrostomy for pancreatic pseudocyst. In March 2013 was admitted for hemorrhage into psueodcyst. In April 2013 was treated at [facility] for polymicrobial bacteremia (fungal, MRSA and enterobacter) felt to be due to ruptured pseudocyst, treated medically. Admitted Jan 2014 with abd pain and had gas and fluid in pancreatic bed. She refused surgery ("would not make it") and had fluid aspirated by IR- gram stain showed yeast and cultures grew enterococcus species, sensitive to amp and vanc. She was seen by ID and discharged on IV vanc/fortaz with HD and po voriconazole.     [name] ESRD on hemodialysis 05/06/2012    T,Th,Sat HD by [employer] on [address] [employer] [address]. Started hemodialysis around July 2013.    [name] CHF (congestive heart failure)   . Complication of vascular access for dialysis 07/13/2012    L arm AVG (placed by Dr. Lyda Perone @ [facility]25) was ligated 1/6 after significant L arm swelling secondary to L SCV stenosis and pacemaker; new access to be placed at right arm on 1/29 by Dr. Hollace Hayward  Past Surgical History  Procedure Laterality Date  . Esophagogastroduodenoscopy  09/15/2011    Procedure: ESOPHAGOGASTRODUODENOSCOPY (EGD);  Surgeon: Theda Belfast, MD;  Location: [facility] ENDOSCOPY;  Service: Endoscopy;  Laterality: N/A;  . Cholecystectomy  2003  . Tonsillectomy and adenoidectomy  1961  . Appendectomy  1973?  [name] Vaginal hysterectomy  1973?  [name] Tubal ligation  1972  . Dilation and  curettage of uterus      "bunch from profuse bleeding in the 1970's" (05/23/2012)  . Coronary artery bypass graft  2008    CABG X4  . Coronary angioplasty with stent placement  2008    "1; day after CABG" (05/23/2012)  . Cardiac catheterization      "before 2008 and 3 wk ago" (05/23/2012)  . Insert / replace / remove pacemaker  06/2011    pacemaker ICD  . Cardiac defibrillator placement  06/2011  . Arteriovenous graft placement  03/2012    right antecub  . Insertion of dialysis catheter  ~ 10/2011    right chest  . Peritoneal catheter insertion  08/2011  . Peritoneal catheter removal  ~ 10/2011    Family History  Problem Relation Age of Onset  . Coronary artery disease Mother   . Hypertension Mother   . Diabetes Mother   . Diabetes Sister   . Anesthesia problems Neg Hx     Social History:  reports that she quit smoking about 12 years ago. Her smoking use included Cigarettes. She has a 10.23 pack-year smoking history. She has never used smokeless tobacco. She reports that she does not drink alcohol or use illicit drugs.  Allergies:  Allergies  Allergen Reactions  . Codeine Itching  . Morphine And Related Shortness Of Breath    SOB when given IV once "to me it was mild; I called out fast for help; never had to intubate" (05/23/2012)  . Ace Inhibitors Other (See Comments)    Unknown reaction but her physician stated that she can't take it (specifically lisinopril)  . Tylenol (Acetaminophen) Other (See Comments)    Patient states that the doctor told her she has a Fatty Liver.    Medications:  Scheduled: . amiodarone  200 mg Oral BID  . aspirin EC  81 mg Oral Daily  . carvedilol  6.25 mg Oral BID WC  . cinacalcet  60 mg Oral Q breakfast  . docusate sodium  100 mg Oral BID  . feeding supplement (NEPRO CARB STEADY)  237 mL Oral BID WC  . methadone  5 mg Oral QID  . multivitamin  1 tablet Oral Daily  . nitroGLYCERIN  0.5 inch Topical Q6H  . pantoprazole  40 mg Oral Daily  .  ranolazine  500 mg Oral Daily  . sevelamer carbonate  800 mg Oral TID WC  . simvastatin  5 mg Oral q1800  . sodium chloride  3 mL Intravenous Q12H    Results for orders placed during the hospital encounter of 10/08/12 (from the past 48 hour(s))  MAGNESIUM     Status: None   Collection Time    10/08/12  7:22 PM      Result Value Range   Magnesium 2.2  1.5 - 2.5 mg/dL  CBC WITH DIFFERENTIAL     Status: Abnormal   Collection Time    10/08/12  7:35 PM      Result Value Range   WBC 13.8 (*) 4.0 - 10.5 K/uL   RBC 2.96 (*) 3.87 - 5.11 MIL/uL   Hemoglobin  8.8 (*) 12.0 - 15.0 g/dL   HCT 30.8 (*) 65.7 - 84.6 %   MCV 87.2  78.0 - 100.0 fL   MCH 29.7  26.0 - 34.0 pg   MCHC 34.1  30.0 - 36.0 g/dL   RDW 96.2 (*) 95.2 - 84.1 %   Platelets 120 (*) 150 - 400 K/uL   Neutrophils Relative 92 (*) 43 - 77 %   Neutro Abs 12.6 (*) 1.7 - 7.7 K/uL   Lymphocytes Relative 3 (*) 12 - 46 %   Lymphs Abs 0.3 (*) 0.7 - 4.0 K/uL   Monocytes Relative 6  3 - 12 %   Monocytes Absolute 0.8  0.1 - 1.0 K/uL   Eosinophils Relative 0  0 - 5 %   Eosinophils Absolute 0.0  0.0 - 0.7 K/uL   Basophils Relative 0  0 - 1 %   Basophils Absolute 0.0  0.0 - 0.1 K/uL  BASIC METABOLIC PANEL     Status: Abnormal   Collection Time    10/08/12  7:35 PM      Result Value Range   Sodium 138  135 - 145 mEq/L   Potassium 3.5  3.5 - 5.1 mEq/L   Chloride 99  96 - 112 mEq/L   CO2 29  19 - 32 mEq/L   Glucose, Bld 154 (*) 70 - 99 mg/dL   BUN 17  6 - 23 mg/dL   Creatinine, Ser 3.24 (*) 0.50 - 1.10 mg/dL   Calcium 9.1  8.4 - 40.1 mg/dL   GFR calc non Af Amer 44 (*) >90 mL/min   GFR calc Af Amer 50 (*) >90 mL/min   Comment:            The eGFR has been calculated     using the CKD EPI equation.     This calculation has not been     validated in all clinical     situations.     eGFR's persistently     <90 mL/min signify     possible Chronic Kidney Disease.  TROPONIN I     Status: Abnormal   Collection Time    10/08/12  7:35  PM      Result Value Range   Troponin I 5.67 (*) <0.30 ng/mL   Comment:            Due to the release kinetics of cTnI,     a negative result within the first hours     of the onset of symptoms does not rule out     myocardial infarction with certainty.     If myocardial infarction is still suspected,     repeat the test at appropriate intervals.     CRITICAL RESULT CALLED TO, READ BACK BY AND VERIFIED WITH:     K.[facility] 2035 10/08/12 EHOWARD  MRSA PCR SCREENING     Status: None   Collection Time    10/09/12  1:28 AM      Result Value Range   MRSA by PCR NEGATIVE  NEGATIVE   Comment:            The GeneXpert MRSA Assay (FDA     approved for NASAL specimens     only), is one component of a     comprehensive MRSA colonization     surveillance program. It is not     intended to diagnose MRSA     infection nor to guide or     monitor treatment  for     MRSA infections.  PRO B NATRIURETIC PEPTIDE     Status: Abnormal   Collection Time    10/09/12  3:30 AM      Result Value Range   Pro B Natriuretic peptide (BNP) >70000.0 (*) 0.0 - 100.0 pg/mL  GLUCOSE, CAPILLARY     Status: Abnormal   Collection Time    10/09/12  3:53 AM      Result Value Range   Glucose-Capillary 110 (*) 70 - 99 mg/dL  BASIC METABOLIC PANEL     Status: Abnormal   Collection Time    10/09/12  7:01 AM      Result Value Range   Sodium 137  135 - 145 mEq/L   Potassium 3.5  3.5 - 5.1 mEq/L   Chloride 97  96 - 112 mEq/L   CO2 31  19 - 32 mEq/L   Glucose, Bld 117 (*) 70 - 99 mg/dL   BUN 24 (*) 6 - 23 mg/dL   Creatinine, Ser 4.09 (*) 0.50 - 1.10 mg/dL   Calcium 81.1  8.4 - 91.4 mg/dL   GFR calc non Af Amer 30 (*) >90 mL/min   GFR calc Af Amer 34 (*) >90 mL/min   Comment:            The eGFR has been calculated     using the CKD EPI equation.     This calculation has not been     validated in all clinical     situations.     eGFR's persistently     <90 mL/min signify     possible Chronic Kidney Disease.   CBC     Status: Abnormal   Collection Time    10/09/12  7:01 AM      Result Value Range   WBC 8.8  4.0 - 10.5 K/uL   RBC 3.11 (*) 3.87 - 5.11 MIL/uL   Hemoglobin 9.2 (*) 12.0 - 15.0 g/dL   HCT 78.2 (*) 95.6 - 21.3 %   MCV 89.4  78.0 - 100.0 fL   MCH 29.6  26.0 - 34.0 pg   MCHC 33.1  30.0 - 36.0 g/dL   RDW 08.6 (*) 57.8 - 46.9 %   Platelets 118 (*) 150 - 400 K/uL   Comment: CONSISTENT WITH PREVIOUS RESULT  HEPARIN LEVEL (UNFRACTIONATED)     Status: Abnormal   Collection Time    10/09/12  9:55 AM      Result Value Range   Heparin Unfractionated <0.10 (*) 0.30 - 0.70 IU/mL   Comment:            IF HEPARIN RESULTS ARE BELOW     EXPECTED VALUES, AND PATIENT     DOSAGE HAS BEEN CONFIRMED,     SUGGEST FOLLOW UP TESTING     OF ANTITHROMBIN III LEVELS.  CBC     Status: Abnormal   Collection Time    10/09/12  9:55 AM      Result Value Range   WBC 7.6  4.0 - 10.5 K/uL   RBC 3.23 (*) 3.87 - 5.11 MIL/uL   Hemoglobin 9.6 (*) 12.0 - 15.0 g/dL   HCT 62.9 (*) 52.8 - 41.3 %   MCV 90.1  78.0 - 100.0 fL   MCH 29.7  26.0 - 34.0 pg   MCHC 33.0  30.0 - 36.0 g/dL   RDW 24.4 (*) 01.0 - 27.2 %   Platelets 101 (*) 150 - 400 K/uL   Comment: CONSISTENT  WITH PREVIOUS RESULT    Dg Chest Port 1 View  10/08/2012  *RADIOLOGY REPORT*  Clinical Data: Shortness of breath  PORTABLE CHEST - 1 VIEW  Comparison: 08/19/2012  Findings: Pacing pad are noted over the chest, obscuring detail. Left-sided AICD in place.  Right IJ approach hemodialysis catheter tips over the right atrium.  Moderate enlargement of the cardiomediastinal silhouette again noted with central vascular congestion and hazy perihilar airspace opacity.  Trace right pleural effusion.  Findings are not significantly changed. Evidence of CABG again noted.  IMPRESSION: Stable findings of cardiomegaly with hazy pulmonary opacities and right pleural effusion that could indicate pulmonary edema.   Original Report Authenticated By: Christiana Pellant, M.D.      Review of Systems  Constitutional: Positive for malaise/fatigue and diaphoresis. Negative for fever, chills and weight loss.       Transiently after dialysis yesterday- improved since cardioversion   HENT: Negative.   Eyes: Negative.   Respiratory: Negative.   Cardiovascular: Positive for palpitations. Negative for chest pain, orthopnea, claudication, leg swelling and PND.  Gastrointestinal: Positive for nausea. Negative for heartburn, vomiting, abdominal pain, diarrhea, constipation and blood in stool.       Transiently nauseated during SVT episode yesterday  Genitourinary: Negative.   Musculoskeletal: Negative.   Skin: Negative.   Neurological: Positive for weakness. Negative for dizziness and tingling.  Endo/Heme/Allergies: Negative.   Psychiatric/Behavioral: Negative.   All other systems reviewed and are negative.   Blood pressure 97/75, pulse 103, temperature 96.7 F (35.9 C), temperature source Oral, resp. rate 21, height 5\' 4"  (1.626 m), weight 43 kg (94 lb 12.8 oz), SpO2 99.00%. Physical Exam  Nursing note and vitals reviewed. Constitutional: She is oriented to person, place, and time. She appears well-developed. No distress.  Cachectic and frail appearing  HENT:  Head: Atraumatic.  Mouth/Throat: Oropharynx is clear and moist. No oropharyngeal exudate.  Eyes: Conjunctivae and EOM are normal. Pupils are equal, round, and reactive to light. No scleral icterus.  Neck: Normal range of motion. Neck supple. No JVD present. No tracheal deviation present. No thyromegaly present.  Cardiovascular: Normal rate, regular rhythm and normal heart sounds.  Exam reveals no friction rub.   No murmur heard. Respiratory: Effort normal. No stridor. No respiratory distress. She has no wheezes. She has rales. She exhibits no tenderness.  Fine right base rales  GI: Soft. Bowel sounds are normal. She exhibits no distension. There is no tenderness. There is no rebound and no guarding.   Musculoskeletal: Normal range of motion. She exhibits edema. She exhibits no tenderness.  2+ LE edema Right IJ TDC Left upper arm PICC line "Save Arm " band on right wrist  Lymphadenopathy:    She has no cervical adenopathy.  Neurological: She is alert and oriented to person, place, and time. No cranial nerve deficit. Coordination normal.  Skin: Skin is warm and dry. No rash noted. She is not diaphoretic. No erythema. No pallor.  Psychiatric: She has a normal mood and affect. Her behavior is normal.    Assessment/Plan: 1. Supraventricular tachycardia with chronic volume overload: Currently in normal sinus rhythm status post cardioversion and cardiology attempting cautious up titration of beta blocker doses. No acute dialysis needs at this time however we elected to place on dialysis schedule for tomorrow-additional treatment to help with volume management. 2. End-stage renal disease on hemodialysis: Plan for additional dialysis treatment tomorrow and then to resume T./T./S schedule. 3. Anemia of ESRD: Resume ESA. Continues chronic GI losses. 4. Metabolic  bone disease: Draw phosphorus levels with labs tomorrow, does not appear that she is on vitamin D receptor analogs. On sevelamer and Sensipar. 5. Chronic malnutrition: Encouraged oral nutritional supplements, reiterated renal diet.  Kianni Lheureux K. 10/09/2012, 11:20 AM

## 2012-10-09 NOTE — ED Provider Notes (Signed)
I saw and evaluated the patient, reviewed the resident's note and I agree with the findings and plan, as well as EKG interpretation.  Patient presented with wide-complex tachycardia. Felt that it was likely supraventricular in nature, but cannot rule out V. Tach. Agree with management plan by Dr. Beverely Pace. It was determined that the patient was moderately unstable with hypotension and therefore cardioversion was the appropriate course of action. Patient was cardioverted without complication. She will be admitted to the hospital for further management.  Gilda Crease, MD 10/09/12 [phone]

## 2012-10-09 NOTE — Consult Note (Signed)
[facility] Medical Consultation  [name] OZH:[id number] DOB: 04-01-1951 DOA: 10/08/2012 PCP: Jyl Heinz, MD     Requesting physician: [facility] Dr. Nicholaus Bloom Date of consultation: 10/08/2012 Reason for consultation: Medical Management     Impression/Recommendations Principal Problem:   SVT (supraventricular tachycardia) Active Problems:   Ischemic cardiomyopathy, EF 20-25% echo April 2013, now 10% by cath (05/06/12)   Chronic systolic congestive heart failure, NYHA class 3   LBBB (left bundle branch block)   Pancreatitis, chronic   ESRD on hemodialysis   Hypotension   Esophageal varices in cirrhosis, admitted June 2013 to [facility]   CAD, CABG X 4 Feb '08, LAD stent June '08   ICD (implantable cardiac defibrillator) in place, MDT placed Jan 2013 [place]   1.   SVT-  Admitted to Stepdown Bed,  Had Cardioversion X 1  in ED, [facility] Cards saw in ED.  ++Troponin, continue to cycle Troponins,    2.   +Troponin/NSTEMI/CAD- Seen by [facility] Cards in ED.   Placed on IV heparin Drip, Nitropaste, and O2.   \  3.    Ischemic Cardiomyopathy with EF-20-25%-  Monitor Fluids to prevent overload.     4.   ESRD on HD-   Notify dialysis team to continue dialysis schedule while hospitalized.   Renal Diet and Fluid Restriction.    5.    IDDM-  Continue , and  SSI PRN.     6.    Chronic Pancreatitis/and Esophageal Varices- stable.     7.    Hypotension- Transient, improved after Cardiversion.                      [facility] will followup again tomorrow. Please contact us if we can be of assistance in the meanwhile. Thank you for this consultation.    Chief Complaint:  Symptomatic Tachycardia   HPI: [name] is a 62 y.o. female with multiple medical problems including extensive Cardiac Disease, and ESRD on HD who had undergone dialysis this afternoon and was leaving the center when she began to feel nauseous and SOB.   She complained of having palpitations and her heart  racing.   EMS was called and she was transported to the ED,  She was evaluated and found to have a wide complex tachycardia with a rate of 180 and hypotension, so she had emergent cardioversion with 50 Joules and had improvement in her heart rate and blood pressure.   She denied chest pain.   She was found on her labs to have a +troponin to 5. 67, and is not deemed a procedural candidate at this time.   She was admitted by [facility] and a medicine consultation was placed due to her multiple medical problems.      Review of Systems: The patient denies fever, chills, headaches, vision loss, decreased hearing, hoarseness, syncope, balance deficits, hemoptysis, abdominal pain, nausea, vomiting, melena, hematochezia, severe indigestion/heartburn, hematuria, incontinence, suspicious skin lesions, transient blindness, depression, unusual weight change, abnormal bleeding, enlarged lymph nodes, angioedema, and breast masses.    Past Medical History  Diagnosis Date  . Coronary artery disease     s/p CABG 2008 with multiple PCIs  . Ischemic cardiomyopathy     Cath 04/2012, significant calcifications  . History of nonadherence to medical treatment   . Systolic CHF     62/9528 echo EF 10%  . Esophageal varices   . Cirrhosis     hx of fatty  liver per patient, has known ascites with repeated paracenteses at [facility] as of 2013, also +hx of esoph varices with banding in the past  . Pacemaker   . ICD (implantable cardiac defibrillator) in place   . Hypertension     "used to have HTN; now I'm low" (05/23/2012)  . NSTEMI (non-ST elevated myocardial infarction)     04/2012; Trop peaked to 1.39; 05/23/2012 pt denies ever having MI  . Pneumonia 02/2012    "first time ever" (05/23/2012)  . Chronic bronchitis 1970's thru 2002    "went away when I stopped smoking in 2002" (05/23/2012)  . Diabetes mellitus     Diet controlled  . History of blood transfusion     "alot of them" (05/23/2012)  . Iron (Fe) deficiency  anemia     "severe" (05/23/2012)  . Pancreatitis, chronic 10/04/2011    Had severe gallstone pancreatitis in May 2003, no surgery, treated medically then returned in June 2003 for cholecystectomy and cyst gastrostomy for pancreatic pseudocyst. In March 2013 was admitted for hemorrhage into psueodcyst. In April 2013 was treated at [facility] for polymicrobial bacteremia (fungal, MRSA and enterobacter) felt to be due to ruptured pseudocyst, treated medically. Admitted Jan 2014 with abd pain and had gas and fluid in pancreatic bed. She refused surgery ("would not make it") and had fluid aspirated by IR- gram stain showed yeast and cultures grew enterococcus species, sensitive to amp and vanc. She was seen by ID and discharged on IV vanc/fortaz with HD and po voriconazole.     [name] ESRD on hemodialysis 05/06/2012    T,Th,Sat HD by [facility] on [address]. Started hemodialysis around July 2013.    [name] CHF (congestive heart failure)   . Complication of vascular access for dialysis 07/13/2012    L arm AVG (placed by Dr. Lyda Perone @ [facility]) was ligated 1/6 after significant L arm swelling secondary to L SCV stenosis and pacemaker; new access to be placed at right arm on 1/29 by Dr. Hollace Hayward       Past Surgical History  Procedure Laterality Date  . Esophagogastroduodenoscopy  09/15/2011    Procedure: ESOPHAGOGASTRODUODENOSCOPY (EGD);  Surgeon: Theda Belfast, MD;  Location: [facility] ENDOSCOPY;  Service: Endoscopy;  Laterality: N/A;  . Cholecystectomy  2003  . Tonsillectomy and adenoidectomy  1961  . Appendectomy  1973?  [name] Vaginal hysterectomy  1973?  [name] Tubal ligation  1972  . Dilation and curettage of uterus      "bunch from profuse bleeding in the 1970's" (05/23/2012)  . Coronary artery bypass graft  2008    CABG X4  . Coronary angioplasty with stent placement  2008    "1; day after CABG" (05/23/2012)  . Cardiac catheterization      "before 2008 and 3 wk ago" (05/23/2012)  . Insert / replace / remove  pacemaker  06/2011    pacemaker ICD  . Cardiac defibrillator placement  06/2011  . Arteriovenous graft placement  03/2012    right antecub  . Insertion of dialysis catheter  ~ 10/2011    right chest  . Peritoneal catheter insertion  08/2011  . Peritoneal catheter removal  ~ 10/2011     Medications:  HOME MEDS: Prior to Admission medications   Medication Sig Start Date End Date Taking? Authorizing Provider  aspirin EC 81 MG EC tablet Take 1 tablet (81 mg total) by mouth daily. 05/11/12  Yes Larey Seat, MD  cinacalcet (SENSIPAR) 30 MG tablet Take 2 tablets (  60 mg total) by mouth daily with breakfast. 09/03/12  Yes Elease Etienne, MD  dextrose (GLUTOSE) 40 % GEL Take 37.5 g by mouth as needed (CBG less than 70 or CBG greater than 70 and next meal is more than 1 hours away). 09/03/12  Yes Elease Etienne, MD  docusate sodium 100 MG CAPS Take 100 mg by mouth 2 (two) times daily. 09/03/12  Yes Elease Etienne, MD  HYDROmorphone (DILAUDID) 2 MG tablet Take 1 tablet (2 mg total) by mouth every 6 (six) hours as needed (severe pain.). For pain 09/03/12  Yes Elease Etienne, MD  methadone (DOLOPHINE) 5 MG tablet Take 1 tablet (5 mg total) by mouth 4 (four) times daily. 09/03/12  Yes Elease Etienne, MD  multivitamin (RENA-VIT) TABS tablet Take 1 tablet by mouth daily. 10/14/11  Yes Clanford Cyndie Mull, MD  Nutritional Supplements (FEEDING SUPPLEMENT, NEPRO CARB STEADY,) LIQD Take 237 mLs by mouth 2 (two) times daily with breakfast and lunch. 03/04/12  Yes Srikar Cherlynn Kaiser, MD  pantoprazole (PROTONIX) 40 MG tablet Take 40 mg by mouth daily.   Yes Historical Provider, MD  pravastatin (PRAVACHOL) 20 MG tablet Take 20 mg by mouth at bedtime.    Yes Historical Provider, MD  ranolazine (RANEXA) 500 MG 12 hr tablet Take 500 mg by mouth daily.    Yes Historical Provider, MD  sevelamer (RENAGEL) 400 MG tablet Take 1 tablet (400 mg total) by mouth 3 (three) times daily with meals. With a meal 09/03/12  Yes Elease Etienne, MD     Allergies:  Allergies  Allergen Reactions  . Codeine Itching  . Morphine And Related Shortness Of Breath    SOB when given IV once "to me it was mild; I called out fast for help; never had to intubate" (05/23/2012)  . Ace Inhibitors Other (See Comments)    Unknown reaction but her physician stated that she can't take it (specifically lisinopril)  . Tylenol (Acetaminophen) Other (See Comments)    Patient states that the doctor told her she has a Fatty Liver.     Social History:   reports that she quit smoking about 12 years ago. Her smoking use included Cigarettes. She has a 10.23 pack-year smoking history. She has never used smokeless tobacco. She reports that she does not drink alcohol or use illicit drugs.   Family History: Family History  Problem Relation Age of Onset  . Coronary artery disease Mother   . Hypertension Mother   . Diabetes Mother   . Diabetes Sister   . Anesthesia problems Neg Hx      Physical Exam:  GEN: Pleasant Cachectic appearing 62 year old African American Female examined  and in no acute distress; cooperative with exam Filed Vitals:   10/08/12 2115 10/08/12 2136 10/08/12 2145 10/08/12 2300  BP: 106/83 110/84    Pulse: 102 103 99   Temp:      TempSrc:      Resp: 25 23 23    Height:    5\' 4"  (1.626 m)  Weight:    45.5 kg (100 lb 5 oz)  SpO2: 99% 100% 99%    Blood pressure 110/84, pulse 99, temperature 100.2 F (37.9 C), temperature source Oral, resp. rate 23, height 5\' 4"  (1.626 m), weight 45.5 kg (100 lb 5 oz), SpO2 99.00%. PSYCH: SHe is alert and oriented x4; does not appear anxious does not appear depressed; affect is normal HEENT: Normocephalic and Atraumatic, Mucous membranes pink; PERRLA;  EOM intact; Fundi:  Benign;  No scleral icterus, Nares: Patent, Oropharynx: Clear, Edentulous or Fair Dentition, Neck:  FROM, no cervical lymphadenopathy nor thyromegaly or carotid bruit; no JVD; Breasts:: Not examined CHEST WALL: No  tenderness CHEST: Normal respiration, clear to auscultation bilaterally HEART: Regular rate and rhythm; no murmurs rubs or gallops BACK: No kyphosis or scoliosis; no CVA tenderness ABDOMEN: Positive Bowel Sounds, Scaphoid, Obese, soft non-tender; no masses, no organomegaly, no pannus; no intertriginous candida. Rectal Exam: Not done EXTREMITIES: No bone or joint deformity; age-appropriate arthropathy of the hands and knees; no cyanosis, clubbing or edema; no ulcerations. Genitalia: not examined PULSES: 2+ and symmetric SKIN: Normal hydration no rash or ulceration CNS: Cranial nerves 2-12 grossly intact no focal neurologic deficit     Labs & Imaging Results for orders placed during the hospital encounter of 10/08/12 (from the past 48 hour(s))  MAGNESIUM     Status: None   Collection Time    10/08/12  7:22 PM      Result Value Range   Magnesium 2.2  1.5 - 2.5 mg/dL  CBC WITH DIFFERENTIAL     Status: Abnormal   Collection Time    10/08/12  7:35 PM      Result Value Range   WBC 13.8 (*) 4.0 - 10.5 K/uL   RBC 2.96 (*) 3.87 - 5.11 MIL/uL   Hemoglobin 8.8 (*) 12.0 - 15.0 g/dL   HCT 11.9 (*) 14.7 - 82.9 %   MCV 87.2  78.0 - 100.0 fL   MCH 29.7  26.0 - 34.0 pg   MCHC 34.1  30.0 - 36.0 g/dL   RDW 56.2 (*) 13.0 - 86.5 %   Platelets 120 (*) 150 - 400 K/uL   Neutrophils Relative 92 (*) 43 - 77 %   Neutro Abs 12.6 (*) 1.7 - 7.7 K/uL   Lymphocytes Relative 3 (*) 12 - 46 %   Lymphs Abs 0.3 (*) 0.7 - 4.0 K/uL   Monocytes Relative 6  3 - 12 %   Monocytes Absolute 0.8  0.1 - 1.0 K/uL   Eosinophils Relative 0  0 - 5 %   Eosinophils Absolute 0.0  0.0 - 0.7 K/uL   Basophils Relative 0  0 - 1 %   Basophils Absolute 0.0  0.0 - 0.1 K/uL  BASIC METABOLIC PANEL     Status: Abnormal   Collection Time    10/08/12  7:35 PM      Result Value Range   Sodium 138  135 - 145 mEq/L   Potassium 3.5  3.5 - 5.1 mEq/L   Chloride 99  96 - 112 mEq/L   CO2 29  19 - 32 mEq/L   Glucose, Bld 154 (*) 70 - 99  mg/dL   BUN 17  6 - 23 mg/dL   Creatinine, Ser 7.84 (*) 0.50 - 1.10 mg/dL   Calcium 9.1  8.4 - 69.6 mg/dL   GFR calc non Af Amer 44 (*) >90 mL/min   GFR calc Af Amer 50 (*) >90 mL/min   Comment:            The eGFR has been calculated     using the CKD EPI equation.     This calculation has not been     validated in all clinical     situations.     eGFR's persistently     <90 mL/min signify     possible Chronic Kidney Disease.  TROPONIN I     Status:  Abnormal   Collection Time    10/08/12  7:35 PM      Result Value Range   Troponin I 5.67 (*) <0.30 ng/mL   Comment:            Due to the release kinetics of cTnI,     a negative result within the first hours     of the onset of symptoms does not rule out     myocardial infarction with certainty.     If myocardial infarction is still suspected,     repeat the test at appropriate intervals.     CRITICAL RESULT CALLED TO, READ BACK BY AND VERIFIED WITH:     K.[facility] 2035 10/08/12 EHOWARD     Radiological Exams on Admission: Dg Chest Port 1 View  10/08/2012  *RADIOLOGY REPORT*  Clinical Data: Shortness of breath  PORTABLE CHEST - 1 VIEW  Comparison: 08/19/2012  Findings: Pacing pad are noted over the chest, obscuring detail. Left-sided AICD in place.  Right IJ approach hemodialysis catheter tips over the right atrium.  Moderate enlargement of the cardiomediastinal silhouette again noted with central vascular congestion and hazy perihilar airspace opacity.  Trace right pleural effusion.  Findings are not significantly changed. Evidence of CABG again noted.  IMPRESSION: Stable findings of cardiomegaly with hazy pulmonary opacities and right pleural effusion that could indicate pulmonary edema.   Original Report Authenticated By: Christiana Pellant, M.D.     EKG:         Assessment/Plan Principal Problem:   SVT (supraventricular tachycardia) Active Problems:   Ischemic cardiomyopathy, EF 20-25% echo April 2013, now 10% by cath  (05/06/12)   Chronic systolic congestive heart failure, NYHA class 3   LBBB (left bundle branch block)   Pancreatitis, chronic   ESRD on hemodialysis   Hypotension   Esophageal varices in cirrhosis, admitted June 2013 to [facility]   CAD, CABG X 4 Feb '08, LAD stent June '08   ICD (implantable cardiac defibrillator) in place, MDT placed Jan 2013 [facility] [place]    Code Status:   FULL CODE Family Communication:   No Family Present    Disposition Plan:      Return to Home   Time spent:  60 Minutes  Ron Parker [facility] Pager [phone]  If 7PM-7AM, please contact night-coverage [email or web address] Password [facility] 10/08/2012, 11:08 PM

## 2012-10-09 NOTE — Progress Notes (Signed)
ANTICOAGULATION CONSULT NOTE - Follow Up Consult  Pharmacy Consult for Heparin Indication: chest pain/ACS  Allergies  Allergen Reactions  . Codeine Itching  . Morphine And Related Shortness Of Breath    SOB when given IV once "to me it was mild; I called out fast for help; never had to intubate" (05/23/2012)  . Ace Inhibitors Other (See Comments)    Unknown reaction but her physician stated that she can't take it (specifically lisinopril)  . Tylenol (Acetaminophen) Other (See Comments)    Patient states that the doctor told her she has a Fatty Liver.    Patient Measurements: Height: 5\' 4"  (162.6 cm) Weight: 94 lb 12.8 oz (43 kg) IBW/kg (Calculated) : 54.7  Vital Signs: Temp: 96.7 F (35.9 C) (04/27 0822) Temp src: Oral (04/27 0822) BP: 100/68 mmHg (04/27 0900) Pulse Rate: 86 (04/27 0900)  Labs:  Recent Labs  10/08/12 1935 10/09/12 0701 10/09/12 0955  HGB 8.8* 9.2* 9.6*  HCT 25.8* 27.8* 29.1*  PLT 120* 118* 101*  HEPARINUNFRC  --   --  <0.10*  CREATININE 1.28* 1.76*  --   TROPONINI 5.67*  --  14.39*    Estimated Creatinine Clearance: 22.2 ml/min (by C-G formula based on Cr of 1.76).   Medications:  Scheduled:  . amiodarone  200 mg Oral BID  . [COMPLETED] aspirin  324 mg Oral Once  . aspirin EC  81 mg Oral Daily  . carvedilol  6.25 mg Oral BID WC  . cinacalcet  60 mg Oral Q breakfast  . docusate sodium  100 mg Oral BID  . feeding supplement (NEPRO CARB STEADY)  237 mL Oral BID WC  . [COMPLETED] heparin  2,000 Units Intravenous Once  . [COMPLETED]  HYDROmorphone (DILAUDID) injection  1 mg Intravenous Once  . methadone  5 mg Oral QID  . [COMPLETED] midazolam  2 mg Intravenous Once  . [START ON 10/10/2012] multivitamin  1 tablet Oral QPC supper  . nitroGLYCERIN  0.5 inch Topical Q6H  . pantoprazole  40 mg Oral Daily  . ranolazine  500 mg Oral Daily  . sevelamer carbonate  800 mg Oral TID WC  . simvastatin  5 mg Oral q1800  . sodium chloride  3 mL Intravenous  Q12H  . [DISCONTINUED] carvedilol  3.125 mg Oral BID WC  . [DISCONTINUED] HYDROmorphone  2 mg Oral Once  . [DISCONTINUED] multivitamin  1 tablet Oral Daily    Assessment: 62 y/o F who presented with SOB/weakness/fatigue and was started on heparin per rx. Troponin 14.39<5.67, CBC low and stable. HD patient. HL this AM is <0.10 on 600 units/hr of heparin (had 2000 unit bolus).   Goal of Therapy:  Heparin level 0.3-0.7 units/ml Monitor platelets by anticoagulation protocol: Yes   Plan:  -Heparin 1000 units BOLUS x 1 -Increase heparin infusion to 750 units/hr -8 hour HL at 2000 -Daily CBC/HL -Monitor for bleeding  Abran Duke, PharmD Clinical Pharmacist Phone: [phone] Pager: [phone] 10/09/2012 11:45 AM

## 2012-10-09 NOTE — Progress Notes (Signed)
[facility] Consult Note [place] TEAM 1 - Stepdown/ICU TEAM   LEVITA MONICAL GNF:[id number] DOB: [birth year] DOA: 10/08/2012 PCP: Jyl Heinz, MD  HPI: [name] is a 62 y.o. female with multiple medical problems including extensive Cardiac Disease, and ESRD on HD who had undergone dialysis this afternoon and was leaving the center when she began to feel nauseous and SOB. She complained of having palpitations and her heart racing. EMS was called and she was transported to the ED, She was evaluated and found to have a wide complex tachycardia with a rate of 180 and hypotension, so she had emergent cardioversion with 50 Joules and had improvement in her heart rate and blood pressure. She denied chest pain. She was found on her labs to have a +troponin to 5. 67, and is not deemed a procedural candidate at this time. She was admitted by [facility] and a medicine consultation was placed due to her multiple medical problems.      Assessment/Plan: Principal Problem:   SVT / elevated troponins - converted with 50 j - management per cardiology  Active Problems:   Ischemic cardiomyopathy, EF 20-25% echo April 2013, now 10% by cath (05/06/12)   Chronic systolic congestive heart failure, NYHA class 3   LBBB (left bundle branch block)   CAD, CABG X 4 Feb '08, LAD stent June '08   ICD (implantable cardiac defibrillator) in place, MDT placed Jan 2013 [facility] - management per cardiology    Pancreatitis, chronic? Does not appear to be requiring replacement enzymes for this - NOTED that she dose have a h/o Pancreatic abscess (yeast) treated by ID in Feb of this yr.- antibiotics were discontinued and she was to be monitored closely- not a surgical candidate    Esophageal varices in cirrhosis, admitted June 2013 to [facility] - follow Hgb    ESRD on hemodialysis - have consulted nephology  Thrombocytopenia -appears acute since 3/22-  - follow- HIT panel if continuous drop noted       Hypotension - Bp boderline low - cardiology starting low dose Coreg to assist with arrythmia     Code Status: full code Family Communication: none Disposition Plan: per cardiology   HPI/Subjective: Pt alert. Dyspnea that occurred yesterday during the arrythmia has resolved. No new complaints- noted to be quite underweight- states that she has begun to gain weight and has a good appetite- no c/o abdominal pain   Objective: Blood pressure 103/63, pulse 88, temperature 96.9 F (36.1 C), temperature source Oral, resp. rate 19, height 5\' 4"  (1.626 m), weight 43 kg (94 lb 12.8 oz), SpO2 100.00%.  Intake/Output Summary (Last 24 hours) at 10/09/12 1621 Last data filed at 10/09/12 1100  Gross per 24 hour  Intake  561.4 ml  Output    100 ml  Net  461.4 ml     Exam: General: No acute respiratory distress Lungs: Crackles in RLL Cardiovascular: Regular rate and rhythm - 2/6 murmur at right upper sternal border Abdomen: Nontender, nondistended, soft, bowel sounds positive, no rebound, no ascites, no appreciable mass Extremities: No significant cyanosis, clubbing, 2+ edema bilateral lower extremities  Data Reviewed: Basic Metabolic Panel:  Recent Labs Lab 10/08/12 1922 10/08/12 1935 10/09/12 0701  NA  --  138 137  K  --  3.5 3.5  CL  --  99 97  CO2  --  29 31  GLUCOSE  --  154* 117*  BUN  --  17 24*  CREATININE  --  1.28* 1.76*  CALCIUM  --  9.1 10.1  MG 2.2  --   --    Liver Function Tests: No results found for this basename: AST, ALT, ALKPHOS, BILITOT, PROT, ALBUMIN,  in the last 168 hours No results found for this basename: LIPASE, AMYLASE,  in the last 168 hours No results found for this basename: AMMONIA,  in the last 168 hours CBC:  Recent Labs Lab 10/08/12 1935 10/09/12 0701 10/09/12 0955  WBC 13.8* 8.8 7.6  NEUTROABS 12.6*  --   --   HGB 8.8* 9.2* 9.6*  HCT 25.8* 27.8* 29.1*  MCV 87.2 89.4 90.1  PLT 120* 118* 101*   Cardiac Enzymes:  Recent  Labs Lab 10/08/12 1935 10/09/12 0955  TROPONINI 5.67* 14.39*   BNP (last 3 results)  Recent Labs  06/02/12 1944 10/09/12 0330 10/09/12 0955  PROBNP >70000.0* >70000.0* >70000.0*   CBG:  Recent Labs Lab 10/09/12 0353  GLUCAP 110*    Recent Results (from the past 240 hour(s))  MRSA PCR SCREENING     Status: None   Collection Time    10/09/12  1:28 AM      Result Value Range Status   MRSA by PCR NEGATIVE  NEGATIVE Final   Comment:            The GeneXpert MRSA Assay (FDA     approved for NASAL specimens     only), is one component of a     comprehensive MRSA colonization     surveillance program. It is not     intended to diagnose MRSA     infection nor to guide or     monitor treatment for     MRSA infections.     Studies:  Recent x-ray studies have been reviewed in detail by the Attending Physician  Scheduled Meds:  Scheduled Meds: . amiodarone  200 mg Oral BID  . aspirin EC  81 mg Oral Daily  . carvedilol  4.6875 mg Oral BID WC  . cinacalcet  60 mg Oral Q breakfast  . docusate sodium  100 mg Oral BID  . feeding supplement (NEPRO CARB STEADY)  237 mL Oral BID WC  . methadone  5 mg Oral QID  . [START ON 10/10/2012] multivitamin  1 tablet Oral QPC supper  . nitroGLYCERIN  0.5 inch Topical Q6H  . pantoprazole  40 mg Oral Daily  . ranolazine  500 mg Oral Daily  . sevelamer carbonate  800 mg Oral TID WC  . simvastatin  5 mg Oral q1800  . sodium chloride  10 mL Intravenous Q12H   Continuous Infusions: . heparin 750 Units/hr (10/09/12 1254)    Time spent on care of this patient: 30 min   [facility]  [facility] Office  [phone] Pager - Text Page per Loretha Stapler as per below:  On-Call/Text Page:      [email or web address]      password [email or web address]  If 7PM-7AM, please contact night-coverage [email or web address] Password [email or web address] 10/09/2012, 4:21 PM   LOS: 1 day

## 2012-10-09 NOTE — Progress Notes (Signed)
ANTICOAGULATION CONSULT NOTE - Follow Up Consult  Pharmacy Consult for heparin Indication: chest pain/ACS  Labs:  Recent Labs  10/08/12 1935 10/09/12 0701 10/09/12 0955 10/09/12 1503 10/09/12 2105  HGB 8.8* 9.2* 9.6*  --   --   HCT 25.8* 27.8* 29.1*  --   --   PLT 120* 118* 101*  --   --   HEPARINUNFRC  --   --  <0.10*  --  <0.10*  CREATININE 1.28* 1.76*  --   --   --   TROPONINI 5.67*  --  14.39* 12.42* 8.70*    Assessment: 62yo female remains undetectable on heparin despite bolus and rate increase.  Goal of Therapy:  Heparin level 0.3-0.7 units/ml   Plan:  Will rebolus with heparin 1500 units and increase gtt by 4 units/kg/hr to 900 units/hr and check level in 8hr.  Vernard Gambles, PharmD, BCPS  10/09/2012,10:42 PM

## 2012-10-09 NOTE — Progress Notes (Signed)
Utilization Review Completed.   Steph Cheadle, RN, BSN Nurse Case Manager  [phone]  

## 2012-10-10 DIAGNOSIS — I472 Ventricular tachycardia, unspecified: Secondary | ICD-10-CM

## 2012-10-10 LAB — HEPARIN LEVEL (UNFRACTIONATED)
Heparin Unfractionated: <0.1 IU/mL — ABNORMAL LOW (ref 0.30–0.70)
Heparin Unfractionated: 0.19 IU/mL — ABNORMAL LOW (ref 0.30–0.70)

## 2012-10-10 LAB — CBC
HCT: 25.6 % — ABNORMAL LOW (ref 36.0–46.0)
Hemoglobin: 8.5 g/dL — ABNORMAL LOW (ref 12.0–15.0)
MCH: 29.6 pg (ref 26.0–34.0)
MCHC: 33.2 g/dL (ref 30.0–36.0)

## 2012-10-10 LAB — RENAL FUNCTION PANEL
BUN: 36 mg/dL — ABNORMAL HIGH (ref 6–23)
Glucose, Bld: 85 mg/dL (ref 70–99)
Phosphorus: 4.1 mg/dL (ref 2.3–4.6)
Potassium: 4 mEq/L (ref 3.5–5.1)

## 2012-10-10 MED ORDER — LIDOCAINE-PRILOCAINE 2.5-2.5 % EX CREA: 1.0000 "application " | TOPICAL_CREAM | CUTANEOUS | Status: DC | PRN

## 2012-10-10 MED ORDER — LIDOCAINE HCL (PF) 1 % IJ SOLN: 5.0000 mL | INTRAMUSCULAR | Status: DC | PRN

## 2012-10-10 MED ORDER — ASPIRIN 81 MG PO CHEW
324.0000 mg | CHEWABLE_TABLET | ORAL | Status: AC
  Administered 2012-10-11: 324 mg via ORAL
  Filled 2012-10-10: qty 4

## 2012-10-10 MED ORDER — PENTAFLUOROPROP-TETRAFLUOROETH EX AERO: 1.0000 "application " | INHALATION_SPRAY | CUTANEOUS | Status: DC | PRN

## 2012-10-10 MED ORDER — NEPRO/CARBSTEADY PO LIQD
237.0000 mL | ORAL | Status: DC | PRN
  Filled 2012-10-10: qty 237

## 2012-10-10 MED ORDER — SODIUM CHLORIDE 0.9 % IV SOLN: INTRAVENOUS | Status: DC

## 2012-10-10 MED ORDER — HEPARIN SODIUM (PORCINE) 1000 UNIT/ML DIALYSIS
20.0000 [IU]/kg | INTRAMUSCULAR | Status: DC | PRN
  Administered 2012-10-13: 900 [IU] via INTRAVENOUS_CENTRAL
  Filled 2012-10-10: qty 1

## 2012-10-10 MED ORDER — SODIUM CHLORIDE 0.9 % IJ SOLN: 3.0000 mL | Freq: Two times a day (BID) | INTRAMUSCULAR | Status: DC

## 2012-10-10 MED ORDER — HEPARIN (PORCINE) IN NACL 100-0.45 UNIT/ML-% IJ SOLN
1400.0000 [IU]/h | INTRAMUSCULAR | Status: DC
  Administered 2012-10-10: 1200 [IU]/h via INTRAVENOUS
  Administered 2012-10-11: 1400 [IU]/h via INTRAVENOUS
  Administered 2012-10-11: 1200 [IU]/h via INTRAVENOUS
  Filled 2012-10-10 (×2): qty 250

## 2012-10-10 MED ORDER — SODIUM CHLORIDE 0.9 % IV SOLN: 100.0000 mL | INTRAVENOUS | Status: DC | PRN

## 2012-10-10 MED ORDER — ALTEPLASE 2 MG IJ SOLR
2.0000 mg | Freq: Once | INTRAMUSCULAR | Status: DC | PRN
  Filled 2012-10-10: qty 2

## 2012-10-10 MED ORDER — HEPARIN BOLUS VIA INFUSION
1300.0000 [IU] | Freq: Once | INTRAVENOUS | Status: AC
  Administered 2012-10-10: 1300 [IU] via INTRAVENOUS
  Filled 2012-10-10: qty 1300

## 2012-10-10 MED ORDER — SODIUM CHLORIDE 0.9 % IV SOLN: 250.0000 mL | INTRAVENOUS | Status: DC | PRN

## 2012-10-10 MED ORDER — SODIUM CHLORIDE 0.9 % IJ SOLN: 3.0000 mL | INTRAMUSCULAR | Status: DC | PRN

## 2012-10-10 MED ORDER — DARBEPOETIN ALFA-POLYSORBATE 100 MCG/0.5ML IJ SOLN: 100.0000 ug | INTRAMUSCULAR | Status: DC

## 2012-10-10 MED ORDER — HEPARIN SODIUM (PORCINE) 1000 UNIT/ML DIALYSIS
1000.0000 [IU] | INTRAMUSCULAR | Status: DC | PRN
  Administered 2012-10-10: 1000 [IU] via INTRAVENOUS_CENTRAL
  Filled 2012-10-10: qty 1

## 2012-10-10 MED ORDER — DARBEPOETIN ALFA-POLYSORBATE 100 MCG/0.5ML IJ SOLN
INTRAMUSCULAR | Status: AC
  Administered 2012-10-10: 100 ug via INTRAVENOUS
  Filled 2012-10-10: qty 0.5

## 2012-10-10 NOTE — Progress Notes (Signed)
ANTICOAGULATION CONSULT NOTE - Follow Up Consult  Pharmacy Consult for heparin Indication: chest pain/ACS   Recent Labs  10/08/12 1935 10/09/12 0701  10/09/12 0955 10/09/12 1503 10/09/12 2105 10/10/12 0700 10/10/12 0703 10/10/12 0704 10/10/12 2033  HGB 8.8* 9.2*  --  9.6*  --   --   --   --  8.5*  --   HCT 25.8* 27.8*  --  29.1*  --   --   --   --  25.6*  --   PLT 120* 118*  --  101*  --   --   --   --  116*  --   HEPARINUNFRC  --   --   < > <0.10*  --  <0.10* <0.10*  --   --  0.19*  CREATININE 1.28* 1.76*  --   --   --   --   --  2.50*  --   --   TROPONINI 5.67*  --   --  14.39* 12.42* 8.70*  --   --   --   --   < > = values in this interval not displayed. Patient Weight: 44.7 kg  Medications: IV heparin @ 1050 units/hr  Assessment: 62 y/o F w/ESRD (HD-TTS PTA) who presented with SOB/weakness/fatigue and was started on IV heparin for ACS/STEMI. Patient currently in HD for additional session for volume management. Heparin level still subtherapeutic but it's trending up.   Goal of Therapy:  Heparin level 0.3-0.7 units/ml   Plan:   Increase heparin to 1200 units/hr F/u with AM level

## 2012-10-10 NOTE — Progress Notes (Signed)
Subjective: In HD, no complaints, no more SOB  Objective: Vital signs in last 24 hours: Temp:  [96.9 F (36.1 C)-98.4 F (36.9 C)] 98.4 F (36.9 C) (04/28 0648) Pulse Rate:  [78-93] 83 (04/28 0930) Resp:  [12-22] 16 (04/28 0930) BP: (94-116)/(63-84) 101/67 mmHg (04/28 0930) SpO2:  [94 %-100 %] 98 % (04/28 0930) Weight:  [98 lb 8.7 oz (44.7 kg)] 98 lb 8.7 oz (44.7 kg) (04/28 0648) Weight change: -1 lb 12.2 oz (-0.8 kg) Last BM Date: 10/08/12 Intake/Output from previous day: +734  Wt 44.7 kg, up from 43 yesterday. 04/27 0701 - 04/28 0700 In: 814.7 [P.O.:480; I.V.:334.7] Out: 100 [Urine:100] Intake/Output this shift:    PE: General:alert and oriented, no complaints Heart:S1S2 RRR Lungs:clear though diminished Abd:+ BS, soft, non tender  Ext:+ edema of lower ext. Neuro:alert and oriented   Lab Results:  Recent Labs  10/09/12 0955 10/10/12 0704  WBC 7.6 8.6  HGB 9.6* 8.5*  HCT 29.1* 25.6*  PLT 101* 116*   BMET  Recent Labs  10/09/12 0701 10/10/12 0703  NA 137 133*  K 3.5 4.0  CL 97 95*  CO2 31 31  GLUCOSE 117* 85  BUN 24* 36*  CREATININE 1.76* 2.50*  CALCIUM 10.1 9.9    Recent Labs  10/09/12 1503 10/09/12 2105  TROPONINI 12.42* 8.70*    Lab Results  Component Value Date   CHOL 91 05/06/2012   HDL 46 05/06/2012   LDLCALC 36 05/06/2012   TRIG 44 05/06/2012   CHOLHDL 2.0 05/06/2012   Lab Results  Component Value Date   HGBA1C 4.8 05/06/2012     Lab Results  Component Value Date   TSH 2.582 10/09/2012    Hepatic Function Panel  Recent Labs  10/10/12 0703  ALBUMIN 2.4*      Studies/Results: Dg Chest Port 1 View  10/08/2012  *RADIOLOGY REPORT*  Clinical Data: Shortness of breath  PORTABLE CHEST - 1 VIEW  Comparison: 08/19/2012  Findings: Pacing pad are noted over the chest, obscuring detail. Left-sided AICD in place.  Right IJ approach hemodialysis catheter tips over the right atrium.  Moderate enlargement of the cardiomediastinal  silhouette again noted with central vascular congestion and hazy perihilar airspace opacity.  Trace right pleural effusion.  Findings are not significantly changed. Evidence of CABG again noted.  IMPRESSION: Stable findings of cardiomegaly with hazy pulmonary opacities and right pleural effusion that could indicate pulmonary edema.   Original Report Authenticated By: Christiana Pellant, M.D.     Medications: I have reviewed the patient's current medications. Scheduled Meds: . amiodarone  200 mg Oral BID  . aspirin EC  81 mg Oral Daily  . carvedilol  4.6875 mg Oral BID WC  . cinacalcet  60 mg Oral Q breakfast  . darbepoetin (ARANESP) injection - DIALYSIS  100 mcg Intravenous Q Mon-HD  . docusate sodium  100 mg Oral BID  . feeding supplement (NEPRO CARB STEADY)  237 mL Oral BID WC  . heparin  1,300 Units Intravenous Once  . methadone  5 mg Oral QID  . multivitamin  1 tablet Oral QPC supper  . nitroGLYCERIN  0.5 inch Topical Q6H  . pantoprazole  40 mg Oral Daily  . ranolazine  500 mg Oral Daily  . sevelamer carbonate  800 mg Oral TID WC  . simvastatin  5 mg Oral q1800  . sodium chloride  10 mL Intravenous Q12H   Continuous Infusions: . heparin 900 Units/hr (10/10/12 0050)   PRN Meds:.sodium chloride, sodium  chloride, acetaminophen, acetaminophen, ALPRAZolam, alteplase, dextrose, feeding supplement (NEPRO CARB STEADY), guaiFENesin, heparin, HYDROmorphone (DILAUDID) injection, HYDROmorphone, lidocaine (PF), lidocaine-prilocaine, ondansetron (ZOFRAN) IV, ondansetron, oxyCODONE, pentafluoroprop-tetrafluoroeth, sodium chloride, zolpidem  Assessment/Plan: Principal Problem:   SVT (supraventricular tachycardia) Active Problems:   Ischemic cardiomyopathy, EF 20-25% echo April 2013, now 10% by cath (05/06/12)   Chronic systolic congestive heart failure, NYHA class 3   LBBB (left bundle branch block)   Pancreatitis, chronic   Esophageal varices in cirrhosis, admitted June 2013 to [facility]   ESRD on  hemodialysis   CAD, CABG X 4 Feb '08, LAD stent June '08   ICD (implantable cardiac defibrillator) in place, MDT placed Jan 2013 [facility]   Hypotension  PLAN: Undergoing HD now.  Plan for ICD interrogation.  ? cardiac cath for pk Troponin of 14, now down to 8.    LOS: 2 days   Time spent with pt. :15 minutes. [facility] R  Nurse Practitioner Certified Pager [phone] 10/10/2012, 9:43 AM   I have seen and examined the patient along with [facility] R  Nurse Practitioner Certified.  I have reviewed the chart, notes and new data.  I agree with PA's note.  Key new complaints: no angina or dyspnea, feels "tired" Key examination changes: cachecticfully alert and oriented, no clinical overt CHF, non-functional left forearm AV fistula, right subclavian dialysis catheter Key new findings / data: cardiac enzymes are substantially higher, consistent with a true myocardial infarction (less likely to be due to the tachyarrhythmia or defibrillation shock).  Her device was interrogated today and as far as I can tell we have never checked it in [place]. The last device interrogation seems to have been in December, presumably at the [facility] or at [place] (she had had numerous hospitalizations).  In the EMR at [facility], the last Cardiology evaluation appears to have occurred in May 2013 (notes not available), when the admitting diagnosis is "GI bleed". Subsequent encounters are related to HD access and ascites.  Her device interrogation shows unexpected findings. She has a CRT-D device and al three leads have normal parameters, but the device is programmed DDI, lower rate 50 bpm with a long AV delay, which suggests that the intent was to avoid ventricular pacing. No meaningful AFib or other atrial arrhythmia has been recorded, which makes me think this was not the cause for DDI programming.  Two episodes of ventricular tachycardia are recorded, including the one that led to the current admission  (another episode in February lasted  < 30 seconds). It was initially detected by the device, but therapy was not delivered since the episode slowed to 198 bpm, just below the therapy zone.  Of note she has retrograde VA conduction, therefore the lack of AV dissociation should not be used a way to differentiate VT from SVT. There is clear AV dissociation in February, but not the current event. Both episodes are monomorphic VT and have the same morphology.    PLAN: 1. ACUTE nonSTEMI : Left heart cath, coronary angio and possible PCI/stent in AM. Although she denies recent GI bleeding, a bare metal stent may be appropriate based on the angio findings (previous GI bleeding and cirrhosis with esophageal varices). In fact, no intervention may be best approach if the culprit vessel is not large. 2. SUSTAINED VT: ICD reprogrammed to a three zone device (VF at 200 bpm, VT at 170 bpm, monitor zone at 150). 3. GET OLD RECORDS - request faxed to the [facility]. It is very important to know  whether the avoidance of BiV pacing was intentional. For now we have reprogrammed the device to provide CRT - the lead parameters are good and the CXR shows good posterolateral LV lead placement. 4. Try to augment antiarrhythmic therapy (primarily beta blockers, as BP allows). More potent antiarrhythmic therapy (e.g. Amiodarone, dofetilide) would be very risky in the setting of renal and liver failure.  Thurmon Fair, MD, [facility] [phone] 10/10/2012, 1:55 PM

## 2012-10-10 NOTE — Procedures (Signed)
Patient was seen on dialysis and the procedure was supervised.  BFR 350  Via PC BP is  114/75.   Patient appears to be tolerating treatment well  [name] A 10/10/2012

## 2012-10-10 NOTE — Progress Notes (Signed)
[facility] Consult Note [place] TEAM 1 - Stepdown/ICU TEAM   [name] WUJ:[id number] DOB: Apr 20, 1951 DOA: 10/08/2012 PCP: Jyl Heinz, MD  HPI: 62 y.o. female with multiple medical problems including extensive Cardiac Disease, and ESRD on HD who had undergone dialysis and was leaving the center when she began to feel nauseous and SOB. She complained of having palpitations and her heart racing. EMS was called and she was transported to the ED.  She was evaluated and found to have a wide complex tachycardia with a rate of 180 and hypotension so she had emergent cardioversion with 50 Joules and had improvement in her heart rate and blood pressure. She denied chest pain. She was found on her labs to have a +troponin to 5.67, and was not deemed a procedural candidate. She was admitted by [facility] Cardiology and a medicine consultation was placed due to her multiple medical problems.   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 following Hgb, which is stable for now  ESRD on hemodialysis As per nephrology  Thrombocytopenia appears acute since 3/22 - holding stable for now   Hypotension Bp boderline low but  appears to be stable - follow trend without changes today  Code Status: FULL Family Communication: No family present at time of exam  Subjective: Patient is resting comfortably.  She complains of some abdominal cramping type pain but does not appear to be in pain.  No wincing or any objective evidence of pain is appreciable during exam of abdomen.  She is quite pleasant and denies any other complaints.   Objective: Blood pressure 102/73, pulse 86, temperature 99 F (37.2 C), temperature source Oral, resp. rate 21, height 5\' 4"  (1.626 m), weight 43 kg (94 lb 12.8 oz), SpO2 99.00%.  Intake/Output Summary (Last 24 hours) at 10/10/12 1656 Last data filed at 10/10/12 1200  Gross per 24 hour  Intake 614.73 ml  Output   1994 ml  Net -1379.27 ml   Exam: General: No acute respiratory distress Lungs: Clear to auscultation throughout without wheeze Cardiovascular: Regular rate and rhythm - 2/6  systolic murmur  Abdomen: Nontender, nondistended, soft, bowel sounds positive, no rebound, no ascites, no appreciable mass Extremities: No significant cyanosis, clubbing, 1+ edema bilateral lower extremities  Data Reviewed: Basic Metabolic Panel:  Recent Labs Lab 10/08/12 1922 10/08/12 1935 10/09/12 0701 10/10/12 0703  NA  --  138 137 133*  K  --  3.5 3.5 4.0  CL  --  99 97 95*  CO2  --  29 31 31   GLUCOSE  --  154* 117* 85  BUN  --  17 24* 36*  CREATININE  --  1.28* 1.76* 2.50*  CALCIUM  --  9.1 10.1  9.9  MG 2.2  --   --   --   PHOS  --   --   --  4.1   Liver Function Tests:  Recent Labs Lab 10/10/12 0703  ALBUMIN 2.4*   CBC:  Recent Labs Lab 10/08/12 1935 10/09/12 0701 10/09/12 0955 10/10/12 0704  WBC 13.8* 8.8 7.6 8.6  NEUTROABS 12.6*  --   --   --   HGB 8.8* 9.2* 9.6* 8.5*  HCT 25.8* 27.8* 29.1* 25.6*  MCV 87.2 89.4 90.1 89.2  PLT 120* 118* 101* 116*   Cardiac Enzymes:  Recent Labs Lab 10/08/12 1935 10/09/12 0955 10/09/12 1503 10/09/12 2105  TROPONINI 5.67*  14.39* 12.42* 8.70*   CBG:  Recent Labs Lab 10/09/12 0353  GLUCAP 110*    Recent Results (from the past 240 hour(s))  MRSA PCR SCREENING     Status: None   Collection Time    10/09/12  1:28 AM      Result Value Range Status   MRSA by PCR NEGATIVE  NEGATIVE Final   Comment:            The GeneXpert MRSA Assay (FDA     approved for NASAL specimens     only), is one component of a     comprehensive MRSA colonization     surveillance program. It is not     intended to diagnose MRSA     infection nor to guide or     monitor treatment for     MRSA infections.     Studies:  Recent x-ray studies have been reviewed in detail by the Attending Physician  Scheduled Meds:  Scheduled Meds: . amiodarone  200 mg Oral BID  . aspirin EC  81 mg Oral Daily  . carvedilol  4.6875 mg Oral BID WC  . cinacalcet  60 mg Oral Q breakfast  . darbepoetin (ARANESP) injection - DIALYSIS  100 mcg Intravenous Q Mon-HD  . docusate sodium  100 mg Oral BID  . feeding supplement (NEPRO CARB STEADY)  237 mL Oral BID WC  . methadone  5 mg Oral QID  . multivitamin  1 tablet Oral QPC supper  . nitroGLYCERIN  0.5 inch Topical Q6H  . pantoprazole  40 mg Oral Daily  . ranolazine  500 mg Oral Daily  . sevelamer carbonate  800 mg Oral TID WC  . simvastatin  5 mg Oral q1800  . sodium chloride  10 mL Intravenous Q12H   Continuous Infusions: . heparin 1,050 Units/hr (10/10/12 1205)    Time spent on care of this patient: 25 mins   [facility] T  [facility] Office  [phone] Pager - Text Page per Loretha Stapler as per below:  On-Call/Text Page:      [email or web address]      password [email or web address]  If 7PM-7AM, please contact night-coverage [email or web address] Password [facility] 10/10/2012, 4:56 PM   LOS: 2 days

## 2012-10-10 NOTE — Progress Notes (Signed)
Subjective:  Seen on HD, no particular complaints.  Blames admit on herself  "i should not have gone outside" Objective Vital signs in last 24 hours: Filed Vitals:   10/10/12 0711 10/10/12 0731 10/10/12 0800 10/10/12 0830  BP: 109/76 115/79 116/80 107/72  Pulse: 82 80 82 78  Temp:      TempSrc:      Resp: 18 15 19 17   Height:      Weight:      SpO2: 97% 100% 99% 97%   Weight change: -0.8 kg (-1 lb 12.2 oz)  Intake/Output Summary (Last 24 hours) at 10/10/12 0908 Last data filed at 10/10/12 0600  Gross per 24 hour  Intake 534.73 ml  Output    100 ml  Net 434.73 ml   Labs: Basic Metabolic Panel:  Recent Labs Lab 10/08/12 1935 10/09/12 0701 10/10/12 0703  NA 138 137 133*  K 3.5 3.5 4.0  CL 99 97 95*  CO2 29 31 31   GLUCOSE 154* 117* 85  BUN 17 24* 36*  CREATININE 1.28* 1.76* 2.50*  CALCIUM 9.1 10.1 9.9  PHOS  --   --  4.1   Liver Function Tests:  Recent Labs Lab 10/10/12 0703  ALBUMIN 2.4*   No results found for this basename: LIPASE, AMYLASE,  in the last 168 hours No results found for this basename: AMMONIA,  in the last 168 hours CBC:  Recent Labs Lab 10/08/12 1935 10/09/12 0701 10/09/12 0955 10/10/12 0704  WBC 13.8* 8.8 7.6 8.6  NEUTROABS 12.6*  --   --   --   HGB 8.8* 9.2* 9.6* 8.5*  HCT 25.8* 27.8* 29.1* 25.6*  MCV 87.2 89.4 90.1 89.2  PLT 120* 118* 101* 116*   Cardiac Enzymes:  Recent Labs Lab 10/08/12 1935 10/09/12 0955 10/09/12 1503 10/09/12 2105  TROPONINI 5.67* 14.39* 12.42* 8.70*   CBG:  Recent Labs Lab 10/09/12 0353  GLUCAP 110*    Iron Studies: No results found for this basename: IRON, TIBC, TRANSFERRIN, FERRITIN,  in the last 72 hours Studies/Results: Dg Chest Port 1 View  10/08/2012  *RADIOLOGY REPORT*  Clinical Data: Shortness of breath  PORTABLE CHEST - 1 VIEW  Comparison: 08/19/2012  Findings: Pacing pad are noted over the chest, obscuring detail. Left-sided AICD in place.  Right IJ approach hemodialysis catheter tips  over the right atrium.  Moderate enlargement of the cardiomediastinal silhouette again noted with central vascular congestion and hazy perihilar airspace opacity.  Trace right pleural effusion.  Findings are not significantly changed. Evidence of CABG again noted.  IMPRESSION: Stable findings of cardiomegaly with hazy pulmonary opacities and right pleural effusion that could indicate pulmonary edema.   Original Report Authenticated By: Christiana Pellant, M.D.    Medications: Infusions: . heparin 900 Units/hr (10/10/12 0050)    Scheduled Medications: . amiodarone  200 mg Oral BID  . aspirin EC  81 mg Oral Daily  . carvedilol  4.6875 mg Oral BID WC  . cinacalcet  60 mg Oral Q breakfast  . docusate sodium  100 mg Oral BID  . feeding supplement (NEPRO CARB STEADY)  237 mL Oral BID WC  . methadone  5 mg Oral QID  . multivitamin  1 tablet Oral QPC supper  . nitroGLYCERIN  0.5 inch Topical Q6H  . pantoprazole  40 mg Oral Daily  . ranolazine  500 mg Oral Daily  . sevelamer carbonate  800 mg Oral TID WC  . simvastatin  5 mg Oral q1800  . sodium chloride  10 mL Intravenous Q12H    have reviewed scheduled and prn medications.  Physical Exam: General: NAD, weak in bed Heart: RRR Lungs: mostly clear, no O2 Abdomen: soft, non tender Extremities: pitting edema below knees Dialysis Access: R IJ PC, "they havent been able to do that surgery yet"   I Assessment/ Plan: Pt is [name] 62 y.o. yo female who was admitted on 10/08/2012 with  SVT requiring cardioversion  Assessment/Plan: 1. SVT- req cardioversion, has remained in NSR- pos troponin - cards on board.  They have her on heparin- considering antiarrythmics- known to have cardiomyopathy with EF of 10% 2. ESRD - normally TTS at [facility] via PC.  Extra treatment today for volume- not able to be very aggressive- if stays will need regular treatment tomorrow 3. Anemia- hgb in the 8s, needs ESA 4. Secondary hyperparathyroidism- on sensipar, renvela,  nephrovite.  Phos is good 5. HTN/volume- edema but just on legs.  Trying to remove but BP/cardiomyopathy will be issue- extra treatment today. on coreg and ranexa  [name]   10/10/2012,9:08 AM  LOS: 2 days

## 2012-10-10 NOTE — Progress Notes (Signed)
INITIAL NUTRITION ASSESSMENT  DOCUMENTATION CODES Per approved criteria  -Severe malnutrition in the context of chronic illness -Underweight   INTERVENTION: 1. Continue Nepro Shakes BID 2. Downgrade diet to Mechanical Soft, per pt request. Will maintain renal restrictions, however strongly recommend discontinuing renal restrictions to improve oral intake. 3. RD to continue to follow nutrition care plan  NUTRITION DIAGNOSIS: Inadequate oral intake related to poor appetite as evidenced by poor oral intake and ongoing weight loss.   Goal: Intake to meet >90% of estimated nutrition needs.  Monitor:  weight trends, lab trends, I/O's, PO intake, supplement tolerance  Reason for Assessment: Malnutrition Screening  62 y.o. female  Admitting Dx: SVT (supraventricular tachycardia)  ASSESSMENT: Pt well-known to this RD.  Admitted with symptomatic tachycardia s/p HD session. Plan for ICD interrogation.  Was recently discharged from [facility]  Pt reports that she was eating well at [facility]. Has only been home for 3 days. Enjoys Nepro Shakes but isn't receiving any while she's here.  Nutrition Focused Physical Exam:  Subcutaneous Fat:  Orbital Region: moderate Upper Arm Region: severe Thoracic and Lumbar Region: n/a  Muscle:  Temple Region: moderate Clavicle Bone Region: severe Clavicle and Acromion Bone Region: severe Scapular Bone Region: n/a Dorsal Hand: severe Patellar Region: severe Anterior Thigh Region: severe Posterior Calf Region: severe  Edema: pitting edema below knees  Pt meets criteria for severe MALNUTRITION in the context of chronic illness as evidenced by severe muscle and fat mass loss.    Height: Ht Readings from Last 1 Encounters:  10/09/12 5\' 4"  (1.626 m)    Weight: Wt Readings from Last 1 Encounters:  10/10/12 94 lb 12.8 oz (43 kg)   EDW: 41 kg  Ideal Body Weight: 120 lb  % Ideal Body Weight: 78%  Wt Readings from Last 10  Encounters:  10/10/12 94 lb 12.8 oz (43 kg)  09/03/12 100 lb 5 oz (45.5 kg)  08/03/12 120 lb (54.432 kg)  07/19/12 113 lb 5.1 oz (51.4 kg)  07/07/12 113 lb 12.1 oz (51.6 kg)  06/03/12 124 lb 1.9 oz (56.3 kg)  05/27/12 128 lb (58.06 kg)  05/11/12 119 lb 4.3 oz (54.1 kg)  05/11/12 119 lb 4.3 oz (54.1 kg)  04/23/12 119 lb 14.9 oz (54.4 kg)    Usual Body Weight: 115 - 120 lb  % Usual Body Weight: 80%  BMI:  Body mass index is 16.26 kg/(m^2). Underweight.  Estimated Nutritional Needs: Kcal: 1600 - 1750  Protein: 65 - 75 grams Fluid: 1.2 liters  Skin: intact  Diet Order: Renal 60-70; 1200 ml fluid restriction  EDUCATION NEEDS: -No education needs identified at this time   Intake/Output Summary (Last 24 hours) at 10/10/12 1400 Last data filed at 10/10/12 1113  Gross per 24 hour  Intake 494.73 ml  Output   1994 ml  Net -1499.27 ml    Last BM: 4/26  Labs:   Recent Labs Lab 10/08/12 1922 10/08/12 1935 10/09/12 0701 10/10/12 0703  NA  --  138 137 133*  K  --  3.5 3.5 4.0  CL  --  99 97 95*  CO2  --  29 31 31   BUN  --  17 24* 36*  CREATININE  --  1.28* 1.76* 2.50*  CALCIUM  --  9.1 10.1 9.9  MG 2.2  --   --   --   PHOS  --   --   --  4.1  GLUCOSE  --  154* 117* 85  CBG (last 3)   Recent Labs  10/09/12 0353  GLUCAP 110*    Scheduled Meds: . amiodarone  200 mg Oral BID  . aspirin EC  81 mg Oral Daily  . carvedilol  4.6875 mg Oral BID WC  . cinacalcet  60 mg Oral Q breakfast  . darbepoetin (ARANESP) injection - DIALYSIS  100 mcg Intravenous Q Mon-HD  . docusate sodium  100 mg Oral BID  . feeding supplement (NEPRO CARB STEADY)  237 mL Oral BID WC  . methadone  5 mg Oral QID  . multivitamin  1 tablet Oral QPC supper  . nitroGLYCERIN  0.5 inch Topical Q6H  . pantoprazole  40 mg Oral Daily  . ranolazine  500 mg Oral Daily  . sevelamer carbonate  800 mg Oral TID WC  . simvastatin  5 mg Oral q1800  . sodium chloride  10 mL Intravenous Q12H     Continuous Infusions: . heparin 1,050 Units/hr (10/10/12 1205)    Past Medical History  Diagnosis Date  . Coronary artery disease     s/p CABG 2008 with multiple PCIs  . Ischemic cardiomyopathy     Cath 04/2012, significant calcifications  . History of nonadherence to medical treatment   . Systolic CHF     69/6295 echo EF 10%  . Esophageal varices   . Cirrhosis     hx of fatty liver per patient, has known ascites with repeated paracenteses at [facility] as of 2013, also +hx of esoph varices with banding in the past  . Pacemaker   . ICD (implantable cardiac defibrillator) in place   . Hypertension     "used to have HTN; now I'm low" (05/23/2012)  . NSTEMI (non-ST elevated myocardial infarction)     04/2012; Trop peaked to 1.39; 05/23/2012 pt denies ever having MI  . Pneumonia 02/2012    "first time ever" (05/23/2012)  . Chronic bronchitis 1970's thru 2002    "went away when I stopped smoking in 2002" (05/23/2012)  . Diabetes mellitus     Diet controlled  . History of blood transfusion     "alot of them" (05/23/2012)  . Iron (Fe) deficiency anemia     "severe" (05/23/2012)  . Pancreatitis, chronic 10/04/2011    Had severe gallstone pancreatitis in May 2003, no surgery, treated medically then returned in June 2003 for cholecystectomy and cyst gastrostomy for pancreatic pseudocyst. In March 2013 was admitted for hemorrhage into psueodcyst. In April 2013 was treated at [facility] for polymicrobial bacteremia (fungal, MRSA and enterobacter) felt to be due to ruptured pseudocyst, treated medically. Admitted Jan 2014 with abd pain and had gas and fluid in pancreatic bed. She refused surgery ("would not make it") and had fluid aspirated by IR- gram stain showed yeast and cultures grew enterococcus species, sensitive to amp and vanc. She was seen by ID and discharged on IV vanc/fortaz with HD and po voriconazole.     [name] ESRD on hemodialysis 05/06/2012    T,Th,Sat HD by [employer] [facility] on [address].  Started hemodialysis around July 2013.    [name] CHF (congestive heart failure)   . Complication of vascular access for dialysis 07/13/2012    L arm AVG (placed by Dr. Lyda Perone @ [facility]) was ligated 1/6 after significant L arm swelling secondary to L SCV stenosis and pacemaker; new access to be placed at right arm on 1/29 by Dr. Hollace Hayward      Past Surgical History  Procedure Laterality Date  . Esophagogastroduodenoscopy  09/15/2011    Procedure: ESOPHAGOGASTRODUODENOSCOPY (EGD);  Surgeon: Theda Belfast, MD;  Location: [facility] ENDOSCOPY;  Service: Endoscopy;  Laterality: N/A;  . Cholecystectomy  2003  . Tonsillectomy and adenoidectomy  1961  . Appendectomy  1973?  [name] Vaginal hysterectomy  1973?  [name] Tubal ligation  1972  . Dilation and curettage of uterus      "bunch from profuse bleeding in the 1970's" (05/23/2012)  . Coronary artery bypass graft  2008    CABG X4  . Coronary angioplasty with stent placement  2008    "1; day after CABG" (05/23/2012)  . Cardiac catheterization      "before 2008 and 3 wk ago" (05/23/2012)  . Insert / replace / remove pacemaker  06/2011    pacemaker ICD  . Cardiac defibrillator placement  06/2011  . Arteriovenous graft placement  03/2012    right antecub  . Insertion of dialysis catheter  ~ 10/2011    right chest  . Peritoneal catheter insertion  08/2011  . Peritoneal catheter removal  ~ 10/2011    Jarold Motto MS, RD, LDN Pager: [phone] After-hours pager: [phone]

## 2012-10-10 NOTE — Progress Notes (Signed)
ANTICOAGULATION CONSULT NOTE - Follow Up Consult  Pharmacy Consult for heparin Indication: chest pain/ACS   Recent Labs  10/08/12 1935 10/09/12 0701 10/09/12 0955 10/09/12 1503 10/09/12 2105 10/10/12 0700 10/10/12 0703 10/10/12 0704  HGB 8.8* 9.2* 9.6*  --   --   --   --  8.5*  HCT 25.8* 27.8* 29.1*  --   --   --   --  25.6*  PLT 120* 118* 101*  --   --   --   --  116*  HEPARINUNFRC  --   --  <0.10*  --  <0.10* <0.10*  --   --   CREATININE 1.28* 1.76*  --   --   --   --  2.50*  --   TROPONINI 5.67*  --  14.39* 12.42* 8.70*  --   --   --    Patient Weight: 44.7 kg  Medications: IV heparin @ 900 units/hr  Assessment: 62 y/o F w/ESRD (HD-TTS PTA) who presented with SOB/weakness/fatigue and was started on IV heparin for ACS/STEMI. Patient currently in HD for additional session for volume management. Heparin level remains below goal (<0.10) despite re-bolus and rate increase overnight. Per RN, no issue with heparin line and no over bleeding noted by RN or in chart notes.  Goal of Therapy:  Heparin level 0.3-0.7 units/ml   Plan:  1) Heparin bolus 1300 units x1 2) Increase IV heparin to 1050 units/hr 3) Check 8 hr heparin level 4) Daily HL and CBC 5) Continue to monitor signs/symptoms of bleeding   Benjaman Pott, PharmD, BCPS 10/10/2012   10:38 AM

## 2012-10-11 ENCOUNTER — Encounter (HOSPITAL_COMMUNITY)
Admission: EM | Disposition: A | Discharge status: Home / Self Care | Payer: Self-pay | Source: Home / Self Care | Attending: Cardiovascular Disease

## 2012-10-11 DIAGNOSIS — K651 Peritoneal abscess: Secondary | ICD-10-CM

## 2012-10-11 DIAGNOSIS — Z992 Dependence on renal dialysis: Secondary | ICD-10-CM

## 2012-10-11 DIAGNOSIS — R1012 Left upper quadrant pain: Secondary | ICD-10-CM

## 2012-10-11 DIAGNOSIS — I472 Ventricular tachycardia: Secondary | ICD-10-CM | POA: Diagnosis present

## 2012-10-11 DIAGNOSIS — N186 End stage renal disease: Secondary | ICD-10-CM

## 2012-10-11 HISTORY — PX: LEFT HEART CATHETERIZATION WITH CORONARY/GRAFT ANGIOGRAM: SHX5450

## 2012-10-11 LAB — CBC
Hemoglobin: 8.7 g/dL — ABNORMAL LOW (ref 12.0–15.0)
MCH: 29.9 pg (ref 26.0–34.0)
RBC: 2.91 MIL/uL — ABNORMAL LOW (ref 3.87–5.11)

## 2012-10-11 LAB — POCT ACTIVATED CLOTTING TIME: Activated Clotting Time: 160 seconds

## 2012-10-11 LAB — BASIC METABOLIC PANEL
BUN: 20 mg/dL (ref 6–23)
Calcium: 9.1 mg/dL (ref 8.4–10.5)
Creatinine, Ser: 1.63 mg/dL — ABNORMAL HIGH (ref 0.50–1.10)
GFR calc Af Amer: 38 mL/min — ABNORMAL LOW (ref >90)
GFR calc non Af Amer: 33 mL/min — ABNORMAL LOW (ref >90)
Glucose, Bld: 99 mg/dL (ref 70–99)

## 2012-10-11 LAB — HEPARIN LEVEL (UNFRACTIONATED): Heparin Unfractionated: 0.23 IU/mL — ABNORMAL LOW (ref 0.30–0.70)

## 2012-10-11 LAB — GLUCOSE, CAPILLARY: Glucose-Capillary: 83 mg/dL (ref 70–99)

## 2012-10-11 SURGERY — LEFT HEART CATHETERIZATION WITH CORONARY/GRAFT ANGIOGRAM
Anesthesia: LOCAL

## 2012-10-11 MED ORDER — SODIUM CHLORIDE 0.9 % IV SOLN
1.0000 mL/kg/h | INTRAVENOUS | Status: AC
  Administered 2012-10-11: 1 mL/kg/h via INTRAVENOUS

## 2012-10-11 MED ORDER — SODIUM CHLORIDE 0.9 % IJ SOLN: 3.0000 mL | Freq: Two times a day (BID) | INTRAMUSCULAR | Status: DC

## 2012-10-11 MED ORDER — HYDROCOD POLST-CHLORPHEN POLST 10-8 MG/5ML PO LQCR
5.0000 mL | Freq: Once | ORAL | Status: AC
  Administered 2012-10-11: 5 mL via ORAL
  Filled 2012-10-11: qty 5

## 2012-10-11 MED ORDER — SODIUM CHLORIDE 0.9 % IV SOLN: 250.0000 mL | INTRAVENOUS | Status: DC | PRN

## 2012-10-11 MED ORDER — NEPRO/CARBSTEADY PO LIQD
237.0000 mL | Freq: Three times a day (TID) | ORAL | Status: DC
  Administered 2012-10-11 – 2012-10-13 (×6): 237 mL via ORAL
  Filled 2012-10-11 (×11): qty 237

## 2012-10-11 MED ORDER — FENTANYL CITRATE 0.05 MG/ML IJ SOLN
INTRAMUSCULAR | Status: AC
  Filled 2012-10-11: qty 2

## 2012-10-11 MED ORDER — HEPARIN (PORCINE) IN NACL 2-0.9 UNIT/ML-% IJ SOLN
INTRAMUSCULAR | Status: AC
  Filled 2012-10-11: qty 1000

## 2012-10-11 MED ORDER — HYDROCOD POLST-CHLORPHEN POLST 10-8 MG/5ML PO LQCR
5.0000 mL | Freq: Two times a day (BID) | ORAL | Status: DC | PRN
  Administered 2012-10-11 – 2012-10-12 (×2): 5 mL via ORAL
  Filled 2012-10-11 (×3): qty 5

## 2012-10-11 MED ORDER — LIDOCAINE HCL (PF) 1 % IJ SOLN
INTRAMUSCULAR | Status: AC
  Filled 2012-10-11: qty 30

## 2012-10-11 MED ORDER — SODIUM CHLORIDE 0.9 % IJ SOLN: 3.0000 mL | INTRAMUSCULAR | Status: DC | PRN

## 2012-10-11 MED ORDER — MIDAZOLAM HCL 2 MG/2ML IJ SOLN
INTRAMUSCULAR | Status: AC
  Filled 2012-10-11: qty 2

## 2012-10-11 NOTE — Progress Notes (Signed)
Subjective:  Events noted.  Cardiology now planning on cardiac cath today and have reprogrammed ICD in some way to help with her rhythm issues.  Extra HD yest removed 2500 of fluid Objective Vital signs in last 24 hours: Filed Vitals:   10/10/12 1700 10/10/12 2023 10/10/12 2348 10/11/12 0335  BP: 113/75 95/68 102/66 94/68  Pulse: 87 77 73 75  Temp:  97.7 F (36.5 C) 97.9 F (36.6 C) 98.6 F (37 C)  TempSrc:  Oral Oral Oral  Resp: 24 21 15 23   Height:      Weight:    44.6 kg (98 lb 5.2 oz)  SpO2: 98% 100% 95% 97%   Weight change: -1.7 kg (-3 lb 12 oz)  Intake/Output Summary (Last 24 hours) at 10/11/12 0826 Last data filed at 10/11/12 0700  Gross per 24 hour  Intake 693.48 ml  Output   1994 ml  Net -1300.52 ml   Labs: Basic Metabolic Panel:  Recent Labs Lab 10/09/12 0701 10/10/12 0703 10/11/12 0530  NA 137 133* 133*  K 3.5 4.0 3.6  CL 97 95* 96  CO2 31 31 28   GLUCOSE 117* 85 99  BUN 24* 36* 20  CREATININE 1.76* 2.50* 1.63*  CALCIUM 10.1 9.9 9.1  PHOS  --  4.1  --    Liver Function Tests:  Recent Labs Lab 10/10/12 0703  ALBUMIN 2.4*   No results found for this basename: LIPASE, AMYLASE,  in the last 168 hours No results found for this basename: AMMONIA,  in the last 168 hours CBC:  Recent Labs Lab 10/08/12 1935 10/09/12 0701 10/09/12 0955 10/10/12 0704 10/11/12 0555  WBC 13.8* 8.8 7.6 8.6 8.0  NEUTROABS 12.6*  --   --   --   --   HGB 8.8* 9.2* 9.6* 8.5* 8.7*  HCT 25.8* 27.8* 29.1* 25.6* 26.2*  MCV 87.2 89.4 90.1 89.2 90.0  PLT 120* 118* 101* 116* 123*   Cardiac Enzymes:  Recent Labs Lab 10/08/12 1935 10/09/12 0955 10/09/12 1503 10/09/12 2105  TROPONINI 5.67* 14.39* 12.42* 8.70*   CBG:  Recent Labs Lab 10/09/12 0353  GLUCAP 110*    Iron Studies: No results found for this basename: IRON, TIBC, TRANSFERRIN, FERRITIN,  in the last 72 hours Studies/Results: No results found. Medications: Infusions: . sodium chloride 50 mL/hr at  10/11/12 0446  . heparin 1,400 Units/hr (10/11/12 0738)    Scheduled Medications: . amiodarone  200 mg Oral BID  . aspirin EC  81 mg Oral Daily  . carvedilol  4.6875 mg Oral BID WC  . cinacalcet  60 mg Oral Q breakfast  . darbepoetin (ARANESP) injection - DIALYSIS  100 mcg Intravenous Q Mon-HD  . docusate sodium  100 mg Oral BID  . feeding supplement (NEPRO CARB STEADY)  237 mL Oral BID WC  . methadone  5 mg Oral QID  . multivitamin  1 tablet Oral QPC supper  . nitroGLYCERIN  0.5 inch Topical Q6H  . pantoprazole  40 mg Oral Daily  . ranolazine  500 mg Oral Daily  . sevelamer carbonate  800 mg Oral TID WC  . simvastatin  5 mg Oral q1800  . sodium chloride  10 mL Intravenous Q12H  . sodium chloride  3 mL Intravenous Q12H    have reviewed scheduled and prn medications.  Physical Exam: General: NAD, weak in bed Heart: RRR Lungs: mostly clear, no O2 Abdomen: soft, non tender Extremities: pitting edema below knees Dialysis Access: R IJ PC, "they havent been  able to do that surgery yet"   I Assessment/ Plan: Pt is a 62 y.o. yo female who was admitted on 10/08/2012 with  SVT requiring cardioversion  Assessment/Plan: 1. SVT- req cardioversion, has remained in NSR- pos troponin - cards on board.  They have her on heparin- considering antiarrythmics- known to have cardiomyopathy with EF of 10%.  Plan is for cardiac cath today and also a reprogram to her ICD?  2. ESRD - normally TTS at [facility] via PC.  Extra treatment yest for volume- regular treatment due today and has been ordered, will coordinate with cath lab 3. Anemia- hgb in the 8s, on ESA 4. Secondary hyperparathyroidism- on sensipar, renvela, nephrovite.  Phos is good 5. HTN/volume- edema but just on legs.  Trying to remove but BP/cardiomyopathy will be issue- extra treatment Monday. on coreg and ranexa 6. Dispo- pt has a good understanding of things it seems.  She wants limited code, no intubation but this isolated arrythmia that  responded to cardioversion and antiarrythmics she would want.    Junior Huezo A   10/11/2012,8:26 AM  LOS: 3 days

## 2012-10-11 NOTE — Brief Op Note (Signed)
10/08/2012 - 10/11/2012  12:25 PM  SURGEON:  Surgeon(s) and Role:    * Marykay Lex, MD - Primary  Procedure(s): LEFT HEART CATHETERIZATION WITH CORONARY/GRAFT ANGIOGRAM (N/A)   PATIENT:  [name]  62 y.o. female with severe ICM, EF ~20%, s/bo CABG in '08 (LIMA-LAD, SVG-Diag, SVG-OM, SVG-rPDA), s/p BiV AICD admitted after DCCV of Sustained VT.  Denied any true angina, but did note R thumb discomfort.  AICD adjusted, but had Troponin level elevated up to 14.  Referred for invasive evaluation.  PRE-OPERATIVE DIAGNOSIS:  Chest pain  POST-OPERATIVE DIAGNOSIS:    Patent LIMA-LAD with ~60% (stable) ~ anastomotic lesion; brisk to-fro flow on native angio; LAD is small caliber distally & wraps the apex  SVG-Diag - now essentially fully atretic & non-functional  SVG-rPDA & SVG OM widely patent.  Retrograde flow from rPDA fills RPL; retrograde flow from OM1 graft fills branching OM2  Native RCA has severe proximal stenosis & fills the rPL  LVP 81/8 mmHg, EDP 18 mmHg; AoP 82/49 mmHg (62 mmHg);   ANESTHESIA:   local and IV sedation - 10 ml Lidocaine SQ, IV Versed 1 mg  EBL:  < 10 ml  PROCEDURE: 5 Fr RFA sheath - fluroscopically guided modified Seldinger's technique; JL4-LCA, JR4-RCA, SVG-OM, SVG-RCA; IMA - LIMA, SVG-Diag & LV Hemodynamics.   SHEATH:  Removed in holding area, manual pressure.  DICTATION: .Note written in EPIC  PLAN OF CARE: Transfer to telemetry; d/c heparin; medical Rx for VT & CAD; ~ efficacy of Ranexa in HD patient, but was started by colleagues.  PATIENT DISPOSITION:  PACU - hemodynamically stable.   Delay start of Pharmacological VTE agent (>24hrs) due to surgical blood loss or risk of bleeding: not applicable; Will use SCDs  HARDING,DAVID W, M.D., M.S. [facility] [address] [place], [place]  [address]  [phone] Pager # [phone] 10/11/2012 12:35 PM

## 2012-10-11 NOTE — Progress Notes (Signed)
ANTICOAGULATION CONSULT NOTE - Follow Up Consult  Pharmacy Consult for heparin Indication: chest pain/ACS  Labs:  Recent Labs  10/09/12 0701  10/09/12 0955 10/09/12 1503 10/09/12 2105 10/10/12 0700 10/10/12 0703 10/10/12 0704 10/10/12 2033 10/11/12 0530 10/11/12 0555  HGB 9.2*  --  9.6*  --   --   --   --  8.5*  --   --  8.7*  HCT 27.8*  --  29.1*  --   --   --   --  25.6*  --   --  26.2*  PLT 118*  --  101*  --   --   --   --  116*  --   --  123*  LABPROT  --   --   --   --   --   --   --   --   --   --  16.3*  INR  --   --   --   --   --   --   --   --   --   --  1.34  HEPARINUNFRC  --   < > <0.10*  --  <0.10* <0.10*  --   --  0.19*  --  0.23*  CREATININE 1.76*  --   --   --   --   --  2.50*  --   --  1.63*  --   TROPONINI  --   --  14.39* 12.42* 8.70*  --   --   --   --   --   --   < > = values in this interval not displayed.  Assessment: 62yo female remains undetectable on heparin despite multiple rate increases and only small differences in level with each increase, level now rising.  Goal of Therapy:  Heparin level 0.3-0.7 units/ml   Plan:  Will increase heparin gtt by 4 units/kg/hr to 1400 units/hr and check level in 8hr.  Vernard Gambles, PharmD, BCPS  10/11/2012,7:03 AM

## 2012-10-11 NOTE — Progress Notes (Addendum)
Subjective: No complaints today, no more SOB - just feels tired;  Notes edema Coughing @ night  Objective: Vital signs in last 24 hours: Temp:  [97.6 F (36.4 C)-99 F (37.2 C)] 98.6 F (37 C) (04/29 0335) Pulse Rate:  [73-87] 75 (04/29 0335) Resp:  [15-24] 23 (04/29 0335) BP: (94-113)/(66-75) 94/68 mmHg (04/29 0335) SpO2:  [95 %-100 %] 97 % (04/29 0335) Weight:  [43 kg (94 lb 12.8 oz)-44.6 kg (98 lb 5.2 oz)] 44.6 kg (98 lb 5.2 oz) (04/29 0335) Weight change: -1.7 kg (-3 lb 12 oz) Last BM Date: 10/08/12 Intake/Output from previous day: +734  Wt 44.7 kg, up from 43 yesterday. 04/28 0701 - 04/29 0700 In: 713.5 [P.O.:180; I.V.:533.5] Out: 1994  Intake/Output this shift:    PE: General:alert and oriented, no complaints; thin, frail / borderline cachectic Heart:S1S2 RRR, hash SEM @ RUSB cres-decres Lungs:diffuse coarse rales that clear mostly with cough Abd:+ BS, soft, non tender, NABS Ext: 2+ edema of lower ext. Neuro:alert and oriented; pleasant mood & affect. Airway 2   Lab Results:  Recent Labs  10/10/12 0704 10/11/12 0555  WBC 8.6 8.0  HGB 8.5* 8.7*  HCT 25.6* 26.2*  PLT 116* 123*   BMET  Recent Labs  10/10/12 0703 10/11/12 0530  NA 133* 133*  K 4.0 3.6  CL 95* 96  CO2 31 28  GLUCOSE 85 99  BUN 36* 20  CREATININE 2.50* 1.63*  CALCIUM 9.9 9.1    Recent Labs  10/09/12 1503 10/09/12 2105  TROPONINI 12.42* 8.70*    Lab Results  Component Value Date   CHOL 91 05/06/2012   HDL 46 05/06/2012   LDLCALC 36 05/06/2012   TRIG 44 05/06/2012   CHOLHDL 2.0 05/06/2012   Lab Results  Component Value Date   HGBA1C 4.8 05/06/2012     Lab Results  Component Value Date   TSH 2.582 10/09/2012    Hepatic Function Panel  Recent Labs  10/10/12 0703  ALBUMIN 2.4*      Studies/Results: No results found.  Medications: I have reviewed the patient's current medications. Scheduled Meds: . amiodarone  200 mg Oral BID  . aspirin EC  81 mg Oral Daily  .  carvedilol  4.6875 mg Oral BID WC  . cinacalcet  60 mg Oral Q breakfast  . darbepoetin (ARANESP) injection - DIALYSIS  100 mcg Intravenous Q Mon-HD  . docusate sodium  100 mg Oral BID  . feeding supplement (NEPRO CARB STEADY)  237 mL Oral BID WC  . methadone  5 mg Oral QID  . multivitamin  1 tablet Oral QPC supper  . nitroGLYCERIN  0.5 inch Topical Q6H  . pantoprazole  40 mg Oral Daily  . ranolazine  500 mg Oral Daily  . sevelamer carbonate  800 mg Oral TID WC  . simvastatin  5 mg Oral q1800  . sodium chloride  10 mL Intravenous Q12H  . sodium chloride  3 mL Intravenous Q12H   Continuous Infusions: . sodium chloride 50 mL/hr at 10/11/12 0446  . heparin 1,400 Units/hr (10/11/12 0738)   PRN Meds:.sodium chloride, acetaminophen, acetaminophen, ALPRAZolam, dextrose, guaiFENesin, heparin, HYDROmorphone (DILAUDID) injection, HYDROmorphone, ondansetron (ZOFRAN) IV, ondansetron, oxyCODONE, sodium chloride, sodium chloride, zolpidem  Assessment/Plan: Principal Problem:   NSTEMI (non-ST elevated myocardial infarction) Active Problems:   Ischemic cardiomyopathy, EF 20-25% echo April 2013, now 10% by cath (05/06/12)   Chronic systolic congestive heart failure, NYHA class 3   CAD, CABG X 4 Feb '08, LAD stent June '  08   ICD (implantable cardiac defibrillator) in place, MDT placed Jan 2013 [facility]   Ventricular tachycardia, sustained -- s/p DCCV in ER   Pancreatitis, chronic   ESRD on hemodialysis   Esophageal varices in cirrhosis, admitted June 2013 to [facility]   LBBB (left bundle branch block)   Hypotension  PLAN: with + Troponin & NSTEMI trend - plan in [facility] today +/- PCI; pt agrees to proceed after R/B/A/I explained. This procedure has been fully reviewed with the patient and written informed consent has been obtained.   --  no clinical overt Angina or CHF despite edema,  --  non-functional left forearm AV fistula, right subclavian dialysis catheter  ICD interrogated yesterday confirmed  VT (2 episodes recorded, including the one that led to the current admission, & also in Feb that lasted  < 30 seconds, detected by the device, but therapy was not delivered since the episode slowed to 198 bpm, just below the therapy zone.) --> adjustments made per Dr. Royann Shivers ( ICD reprogrammed to a three zone device (VF at 200 bpm, VT at 170 bpm, monitor zone at 150). -- still awaiting [facility] notes re reasoning for DDI setting.   BP remains soft, unable to up-titrate BB dose.  For now, until post cath +/- PCI, will keep on Amiodarone, but would not use on d/c.  If PCI needed, would opt for BMS due to h/o Esphageal Varices & h/o bleeding, chronic pancreatitis, etc.  Anti-tussive / expectorant has been ordered.    LOS: 3 days   Time spent with pt. :25 minutes. Marykay Lex  Pager [phone] 10/11/2012, 9:05 AM

## 2012-10-11 NOTE — Progress Notes (Signed)
[facility] Consult Note [place] TEAM 1 - Stepdown/ICU TEAM   [name]:[id number] DOB: Oct 20, 1950 DOA: 10/08/2012 PCP: Jyl Heinz, MD  HPI: 62 y.o. female with multiple medical problems including extensive Cardiac Disease, and ESRD on HD who had undergone dialysis and was leaving the center when she began to feel nauseous and SOB. She complained of having palpitations and her heart racing. EMS was called and she was transported to the ED.  She was evaluated and found to have a wide complex tachycardia with a rate of 180 and hypotension so she had emergent cardioversion with 50 Joules and had improvement in her heart rate and blood pressure. She denied chest pain. She was found on her labs to have a +troponin to 5.67, and was not deemed a procedural candidate. She was admitted by [facility] and a medicine consultation was placed due to her multiple medical problems.   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 in Feb of this yr - antibiotics were discontinued and she was to be monitored closely - currently complains of abdominal pain but does not appear to be in pain in exam does not elicit any evidence of pain  Esophageal varices in cirrhosis, admitted June 2013 to [facility] following Hgb, which is stable for now  ESRD on hemodialysis As per nephrology  Thrombocytopenia appears acute since 3/22 - holding stable for  now   Hypotension Bp boderline low but appears to be stable - follow trend without changes today  Code Status: FULL Family Communication: No family present at time of exam  Subjective: Patient is resting comfortably.  She is having some lower abdominal pain but tolerating a diet and not having diarrhea.   Objective: Blood pressure 86/55, pulse 65, temperature 97.2 F (36.2 C), temperature source Oral, resp. rate 15, height 5\' 4"  (1.626 m), weight 44.6 kg (98 lb 5.2 oz), SpO2 99.00%.  Intake/Output Summary (Last 24 hours) at 10/11/12 1541 Last data filed at 10/11/12 0800  Gross per 24 hour  Intake 677.48 ml  Output      0 ml  Net 677.48 ml   Exam: General: No acute respiratory distress Lungs: Clear to auscultation throughout without wheeze Cardiovascular: Regular rate and rhythm - 2/6  systolic murmur  Abdomen: Nontender, nondistended, soft, bowel sounds positive, no rebound, no ascites, no appreciable mass Extremities: No significant cyanosis, clubbing, 1+ edema bilateral lower extremities  Data Reviewed: Basic Metabolic Panel:  Recent Labs Lab 10/08/12 1922 10/08/12 1935 10/09/12 0701 10/10/12 0703 10/11/12 0530  NA  --  138 137 133* 133*  K  --  3.5 3.5 4.0 3.6  CL  --  99 97 95* 96  CO2  --  29 31 31 28   GLUCOSE  --  154* 117* 85 99  BUN  --  17 24* 36* 20  CREATININE  --  1.28* 1.76* 2.50* 1.63*  CALCIUM  --  9.1 10.1 9.9 9.1  MG 2.2  --   --   --   --  PHOS  --   --   --  4.1  --    Liver Function Tests:  Recent Labs Lab 10/10/12 0703  ALBUMIN 2.4*   CBC:  Recent Labs Lab 10/08/12 1935 10/09/12 0701 10/09/12 0955 10/10/12 0704 10/11/12 0555  WBC 13.8* 8.8 7.6 8.6 8.0  NEUTROABS 12.6*  --   --   --   --   HGB 8.8* 9.2* 9.6* 8.5* 8.7*  HCT 25.8* 27.8* 29.1* 25.6* 26.2*  MCV 87.2 89.4 90.1 89.2 90.0  PLT 120* 118* 101* 116* 123*   Cardiac Enzymes:  Recent Labs Lab 10/08/12 1935 10/09/12 0955 10/09/12 1503 10/09/12 2105  TROPONINI  5.67* 14.39* 12.42* 8.70*   CBG:  Recent Labs Lab 10/09/12 0353 10/11/12 1246  GLUCAP 110* 83    Recent Results (from the past 240 hour(s))  MRSA PCR SCREENING     Status: None   Collection Time    10/09/12  1:28 AM      Result Value Range Status   MRSA by PCR NEGATIVE  NEGATIVE Final   Comment:            The GeneXpert MRSA Assay (FDA     approved for NASAL specimens     only), is one component of a     comprehensive MRSA colonization     surveillance program. It is not     intended to diagnose MRSA     infection nor to guide or     monitor treatment for     MRSA infections.     Studies:  Recent x-ray studies have been reviewed in detail by the Attending Physician  Scheduled Meds:  Scheduled Meds: . amiodarone  200 mg Oral BID  . aspirin EC  81 mg Oral Daily  . carvedilol  4.6875 mg Oral BID WC  . cinacalcet  60 mg Oral Q breakfast  . darbepoetin (ARANESP) injection - DIALYSIS  100 mcg Intravenous Q Mon-HD  . docusate sodium  100 mg Oral BID  . feeding supplement (NEPRO CARB STEADY)  237 mL Oral TID BM  . methadone  5 mg Oral QID  . multivitamin  1 tablet Oral QPC supper  . nitroGLYCERIN  0.5 inch Topical Q6H  . pantoprazole  40 mg Oral Daily  . ranolazine  500 mg Oral Daily  . sevelamer carbonate  800 mg Oral TID WC  . simvastatin  5 mg Oral q1800  . sodium chloride  10 mL Intravenous Q12H  . sodium chloride  3 mL Intravenous Q12H   Continuous Infusions: . sodium chloride      Time spent on care of this patient: 25 mins   [facility]  [facility] Office  [phone] Pager - Text Page per Loretha Stapler as per below:  On-Call/Text Page:      [email or web address]      password [email or web address]  If 7PM-7AM, please contact night-coverage [email or web address] Password [email or web address] 10/11/2012, 3:41 PM   LOS: 3 days

## 2012-10-12 LAB — CBC
MCH: 29.8 pg (ref 26.0–34.0)
MCV: 89.8 fL (ref 78.0–100.0)
Platelets: 125 10*3/uL — ABNORMAL LOW (ref 150–400)
RBC: 2.65 MIL/uL — ABNORMAL LOW (ref 3.87–5.11)
RDW: 17.7 % — ABNORMAL HIGH (ref 11.5–15.5)

## 2012-10-12 LAB — RENAL FUNCTION PANEL
Albumin: 2.4 g/dL — ABNORMAL LOW (ref 3.5–5.2)
BUN: 34 mg/dL — ABNORMAL HIGH (ref 6–23)
Calcium: 8.9 mg/dL (ref 8.4–10.5)
Creatinine, Ser: 2.35 mg/dL — ABNORMAL HIGH (ref 0.50–1.10)
Glucose, Bld: 92 mg/dL (ref 70–99)
Phosphorus: 2.9 mg/dL (ref 2.3–4.6)

## 2012-10-12 LAB — IRON AND TIBC
Iron: 10 ug/dL — ABNORMAL LOW (ref 42–135)
Saturation Ratios: 6 % — ABNORMAL LOW (ref 20–55)
TIBC: 160 ug/dL — ABNORMAL LOW (ref 250–470)
UIBC: 150 ug/dL (ref 125–400)

## 2012-10-12 LAB — BLOOD GAS, VENOUS
Acid-Base Excess: 2.6 mmol/L — ABNORMAL HIGH (ref 0.0–2.0)
Bicarbonate: 26.9 mEq/L — ABNORMAL HIGH (ref 20.0–24.0)
FIO2: 0.21 %
O2 Saturation: 82.8 %
TCO2: 28.2 mmol/L (ref 0–100)
pO2, Ven: 46 mmHg — ABNORMAL HIGH (ref 30.0–45.0)

## 2012-10-12 LAB — SAVE SMEAR

## 2012-10-12 LAB — FERRITIN: Ferritin: 2347 ng/mL — ABNORMAL HIGH (ref 10–291)

## 2012-10-12 LAB — GLUCOSE, CAPILLARY
Glucose-Capillary: 73 mg/dL (ref 70–99)
Glucose-Capillary: 175 mg/dL — ABNORMAL HIGH (ref 70–99)

## 2012-10-12 MED ORDER — ALBUTEROL SULFATE (5 MG/ML) 0.5% IN NEBU
2.5000 mg | INHALATION_SOLUTION | RESPIRATORY_TRACT | Status: DC | PRN
  Administered 2012-10-12 – 2012-10-13 (×4): 2.5 mg via RESPIRATORY_TRACT
  Filled 2012-10-12 (×3): qty 0.5

## 2012-10-12 MED ORDER — ALBUTEROL SULFATE (5 MG/ML) 0.5% IN NEBU
INHALATION_SOLUTION | RESPIRATORY_TRACT | Status: AC
  Administered 2012-10-12: 2.5 mg
  Filled 2012-10-12: qty 0.5

## 2012-10-12 MED ORDER — SODIUM CHLORIDE 0.9 % IV SOLN: 100.0000 mL | INTRAVENOUS | Status: DC | PRN

## 2012-10-12 MED ORDER — HEPARIN SODIUM (PORCINE) 1000 UNIT/ML DIALYSIS
20.0000 [IU]/kg | INTRAMUSCULAR | Status: DC | PRN
  Filled 2012-10-12: qty 1

## 2012-10-12 MED ORDER — DARBEPOETIN ALFA-POLYSORBATE 100 MCG/0.5ML IJ SOLN
INTRAMUSCULAR | Status: AC
  Filled 2012-10-12: qty 0.5

## 2012-10-12 MED ORDER — HYDROXYZINE HCL 25 MG PO TABS
25.0000 mg | ORAL_TABLET | Freq: Once | ORAL | Status: AC
  Administered 2012-10-12: 25 mg via ORAL
  Filled 2012-10-12: qty 1

## 2012-10-12 MED ORDER — PENTAFLUOROPROP-TETRAFLUOROETH EX AERO: 1.0000 "application " | INHALATION_SPRAY | CUTANEOUS | Status: DC | PRN

## 2012-10-12 MED ORDER — ACETAMINOPHEN 325 MG PO TABS: 650.0000 mg | ORAL_TABLET | Freq: Once | ORAL | Status: DC

## 2012-10-12 MED ORDER — HYDROMORPHONE HCL PF 1 MG/ML IJ SOLN
INTRAMUSCULAR | Status: AC
  Filled 2012-10-12: qty 1

## 2012-10-12 MED ORDER — ALTEPLASE 2 MG IJ SOLR
2.0000 mg | Freq: Once | INTRAMUSCULAR | Status: AC | PRN
  Filled 2012-10-12: qty 2

## 2012-10-12 MED ORDER — NEPRO/CARBSTEADY PO LIQD
237.0000 mL | ORAL | Status: DC | PRN
  Filled 2012-10-12: qty 237

## 2012-10-12 MED ORDER — DIPHENHYDRAMINE HCL 25 MG PO CAPS
25.0000 mg | ORAL_CAPSULE | Freq: Once | ORAL | Status: AC
  Administered 2012-10-12: 25 mg via ORAL
  Filled 2012-10-12 (×2): qty 1

## 2012-10-12 MED ORDER — DARBEPOETIN ALFA-POLYSORBATE 200 MCG/0.4ML IJ SOLN: 200.0000 ug | INTRAMUSCULAR | Status: DC

## 2012-10-12 MED ORDER — CAMPHOR-MENTHOL 0.5-0.5 % EX LOTN
TOPICAL_LOTION | CUTANEOUS | Status: DC | PRN
  Filled 2012-10-12: qty 222

## 2012-10-12 MED ORDER — HEPARIN SODIUM (PORCINE) 1000 UNIT/ML DIALYSIS
1000.0000 [IU] | INTRAMUSCULAR | Status: DC | PRN
  Filled 2012-10-12: qty 1

## 2012-10-12 MED ORDER — LIDOCAINE HCL (PF) 1 % IJ SOLN
5.0000 mL | INTRAMUSCULAR | Status: DC | PRN
  Filled 2012-10-12: qty 5

## 2012-10-12 MED ORDER — DARBEPOETIN ALFA-POLYSORBATE 100 MCG/0.5ML IJ SOLN
100.0000 ug | Freq: Once | INTRAMUSCULAR | Status: DC
  Administered 2012-10-12: 100 ug via INTRAVENOUS

## 2012-10-12 MED ORDER — LIDOCAINE-PRILOCAINE 2.5-2.5 % EX CREA
1.0000 "application " | TOPICAL_CREAM | CUTANEOUS | Status: DC | PRN
  Filled 2012-10-12: qty 5

## 2012-10-12 MED ORDER — FUROSEMIDE 10 MG/ML IJ SOLN
20.0000 mg | Freq: Once | INTRAMUSCULAR | Status: AC
  Administered 2012-10-12: 20 mg via INTRAVENOUS

## 2012-10-12 NOTE — Progress Notes (Signed)
[facility]  Patient Status: Active (receiving services up to time of hospitalization)  [facility] is providing the following services: RN Receiving telephonic monitoring  If patient discharges after hours, please call [phone].   [name] 10/12/2012, 9:56 AM

## 2012-10-12 NOTE — Progress Notes (Signed)
Physical Therapy Evaluation Patient Details Name: [name] MRN: [id number] DOB: 07-24-1950 Today's Date: 10/12/2012 Time: 1324-4010 PT Time Calculation (min): 25 min  PT Assessment / Plan / Recommendation Clinical Impression  Pt is 61 yo female with multiple recent admissions who presents with tachycardia and whose functional mobility is significantly limited by fatigue and generalized weakness. She pivoted to bed with min A and was positioned on left side to relieve pressure from buttocks. Recommend HHPT, manual w/c with cushion, rollator, and elevated toilet seat with handles. PT will follow to help increase pt's strength and activity tolerance to increase independence for d/c home.    PT Assessment  Patient needs continued PT services    Follow Up Recommendations  Home health PT;Supervision/Assistance - 24 hour    Does the patient have the potential to tolerate intense rehabilitation      Barriers to Discharge None pt has multiple caregivers    Equipment Recommendations  Wheelchair (measurements PT);Wheelchair cushion (measurements PT) (elevated toilet seat with handles, rollator, tub bench)    Recommendations for Other Services OT consult   Frequency Min 3X/week    Precautions / Restrictions Precautions Precautions: Fall Restrictions Weight Bearing Restrictions: No   Pertinent Vitals/Pain Discomfort buttocks region from sitting, no breakdown noted but positioned SL in bed for pressure relief      Mobility  Bed Mobility Bed Mobility: Sit to Supine Sit to Supine: 4: Min assist Details for Bed Mobility Assistance: min A to get legs in bed and scoot up Transfers Transfers: Sit to Stand;Stand to Sit Sit to Stand: 3: Mod assist;From chair/3-in-1;With upper extremity assist Stand to Sit: 4: Min assist;To chair/3-in-1;With upper extremity assist Stand Pivot Transfers: 4: Min assist Details for Transfer Assistance: mod A for first stand, pt feeling very weak, support  on right side. Pt stood second time for SPT with min A with therapist in front and tookk pivot steps to bed Ambulation/Gait Ambulation/Gait Assistance: Not tested (comment) (pt feeling too tired) Stairs: No Wheelchair Mobility Wheelchair Mobility: No    Exercises     PT Diagnosis: Difficulty walking;Generalized weakness  PT Problem List: Decreased strength;Decreased activity tolerance;Decreased balance;Decreased mobility;Cardiopulmonary status limiting activity PT Treatment Interventions: DME instruction;Gait training;Functional mobility training;Therapeutic activities;Therapeutic exercise;Balance training;Patient/family education   PT Goals Acute Rehab PT Goals PT Goal Formulation: With patient Time For Goal Achievement: 10/26/12 Potential to Achieve Goals: Good Pt will go Supine/Side to Sit: with supervision PT Goal: Supine/Side to Sit - Progress: Goal set today Pt will go Sit to Supine/Side: with supervision PT Goal: Sit to Supine/Side - Progress: Goal set today Pt will go Sit to Stand: with supervision PT Goal: Sit to Stand - Progress: Goal set today Pt will go Stand to Sit: with supervision PT Goal: Stand to Sit - Progress: Goal set today Pt will Ambulate: 51 - 150 feet;with supervision;with rolling walker PT Goal: Ambulate - Progress: Goal set today  Visit Information  Last PT Received On: 10/12/12 Assistance Needed: +1    Subjective Data  Subjective: i am going home this time Patient Stated Goal: return home   Prior Functioning  Home Living Lives With: Family Available Help at Discharge: Available PRN/intermittently;Family;Personal care attendant Type of Home: Apartment Home Access: Level entry Home Layout: One level Bathroom Shower/Tub: Engineer, manufacturing systems: Standard Bathroom Accessibility: Yes How Accessible: Accessible via walker Home Adaptive Equipment: Bedside commode/3-in-1 Additional Comments: has a BSC that fits over her guest bath but will  not fit over her toilet. She often cannot  get all the way to guest bathroom Prior Function Level of Independence: Needs assistance Needs Assistance: Bathing;Dressing;Meal Prep;Light Housekeeping Bath: Minimal Dressing: Minimal Meal Prep: Total Light Housekeeping: Total Able to Take Stairs?: No Driving: No Vocation: Retired Musician: No difficulties    Copywriter, advertising Arousal/Alertness: Awake/alert Behavior During Therapy: WFL for tasks assessed/performed Overall Cognitive Status: Within Functional Limits for tasks assessed    Extremity/Trunk Assessment Right Upper Extremity Assessment RUE ROM/Strength/Tone: WFL for tasks assessed Left Upper Extremity Assessment LUE ROM/Strength/Tone: Deficits LUE ROM/Strength/Tone Deficits: HD port Right Lower Extremity Assessment RLE ROM/Strength/Tone: Deficits RLE ROM/Strength/Tone Deficits: grossly 3/5 throughout RLE Sensation: WFL - Light Touch;WFL - Proprioception RLE Coordination: WFL - gross motor Left Lower Extremity Assessment LLE ROM/Strength/Tone: Deficits LLE ROM/Strength/Tone Deficits: grossly 3/5 throughout LLE Sensation: WFL - Light Touch;WFL - Proprioception LLE Coordination: WFL - gross motor Trunk Assessment Trunk Assessment: Normal   Balance Balance Balance Assessed: Yes Dynamic Standing Balance Dynamic Standing - Balance Support: Bilateral upper extremity supported;During functional activity Dynamic Standing - Level of Assistance: 4: Min assist  End of Session PT - End of Session Activity Tolerance: Patient limited by fatigue Patient left: in bed;with call bell/phone within reach Nurse Communication: Mobility status  GP   Lyanne Co, PT  Acute Rehab Services  [phone]   Lyanne Co 10/12/2012, 4:52 PM

## 2012-10-12 NOTE — Progress Notes (Signed)
RN CM did receive a call from the [place]. CM did call April Alexander back and left vm in regards to pt. Will continue to f/u for disposition needs. Gala Lewandowsky, RN,BSN [phone]

## 2012-10-12 NOTE — Procedures (Signed)
Patient was seen on dialysis and the procedure was supervised.  BFR 400  Via PC BP is  92/58.   Patient appears to be tolerating treatment well  Samanyu Tinnell A 10/12/2012

## 2012-10-12 NOTE — Care Management Note (Signed)
    Page 1 of 2   10/13/2012     3:22:26 PM   CARE MANAGEMENT NOTE 10/13/2012  Patient:  [name], [name]   Account Number:  [id number]  Date Initiated:  10/12/2012  Documentation initiated by:  GRAVES-BIGELOW,Kwan Shellhammer  Subjective/Objective Assessment:   Pt admitted with tachycardia. Pt has hx ESRD. She is active with [facility] for RN services. Will need resumption orders at d/c.     Action/Plan:   RN CM did receive a call from the [place]. CM did call April Alexander back and left vm in regards to pt. Will continue to f/u for disposition needs.   Anticipated DC Date:  10/14/2012   Anticipated DC Plan:  HOME W HOME HEALTH SERVICES      DC Planning Services  CM consult      [facility] Choice  HOME HEALTH   Choice offered to / List presented to:  C-1 Patient        HH arranged  HH-1 RN  HH-10 DISEASE MANAGEMENT  HH-2 PT      HH agency  [facility]   Status of service:  Completed, signed off Medicare Important Message given?   (If response is "NO", the following Medicare IM given date fields will be blank) Date Medicare IM given:   Date Additional Medicare IM given:    Discharge Disposition:  HOME W HOME HEALTH SERVICES  Per UR Regulation:  Reviewed for med. necessity/level of care/duration of stay  If discussed at Long Length of Stay Meetings, dates discussed:    Comments:  10-13-12 1516 [name], RN,BSN [phone] CM saw that pt has left hospital. She was active with [facility] for [facility]. PT recommends HHPT. CM placed call to Wilburt Finlay to see if he can place orders for [facility] listed above. CM did try to contact pt at home [phone]. CM also called CSW at [facility] to make her aware that pt was d/c today and the  HD centers have changed.   RN CM did receive a call from [place] with HD and she stated that pt wanted to change HD centers. CM did call the [facility] to make the CSW [name] aware of the change and dates. CM did also mention that pt  will need transportation to HD and possible HH services with DME. CM will speak to pt after HD treatment. Gala Lewandowsky, RN,BSN [phone]

## 2012-10-12 NOTE — Progress Notes (Signed)
[facility] Consult Note [place] TEAM 1 - Stepdown/ICU TEAM   [name] ZOX:[id number] DOB: 06-27-1950 DOA: 10/08/2012 PCP: Jyl Heinz, MD  HPI: 62 y.o. female with multiple medical problems including extensive Cardiac Disease, and ESRD on HD who had undergone dialysis and was leaving the center when she began to feel nauseous and SOB. She complained of having palpitations and her heart racing. EMS was called and she was transported to the ED.  She was evaluated and found to have a wide complex tachycardia with a rate of 180 and hypotension so she had emergent cardioversion with 50 Joules and had improvement in her heart rate and blood pressure. She denied chest pain. She was found on her labs to have a +troponin to 5.67, and was not deemed a procedural candidate. She was admitted by [facility] and a medicine consultation was placed due to her multiple medical problems.   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  Appetite increasing and has been off antibiotics for a while  Esophageal varices in cirrhosis, admitted June 2013 to [facility] following Hgb, which is stable for now-Would transfuse 1 u PRBC -might need IV iron with dialysis per renal  ESRD on hemodialysis c Hyponatremia As per nephrology Might need  less solutes removed at session with increase volume taken off  Thrombocytopenia appears acute since 3/22 - holding stable for now Get Platelet smear to determine morphology and etiology   Hypotension Bp boderline low but appears to be stable - follow trend without changes today  Code Status: FULL Family Communication: No family present at time of exam  Subjective: Patient is resting comfortably.  No acute concerns. No CP Some mild cough since Cardioversion.  No N/V  No Diarrhoea  Objective: Blood pressure 97/57, pulse 71, temperature 98 F (36.7 C), temperature source Oral, resp. rate 20, height 5\' 4"  (1.626 m), weight 45.1 kg (99 lb 6.8 oz), SpO2 96.00%.  Intake/Output Summary (Last 24 hours) at 10/12/12 1321 Last data filed at 10/12/12 1028  Gross per 24 hour  Intake    120 ml  Output   1616 ml  Net  -1496 ml   Exam: General: No acute respiratory distress Lungs: Clear to auscultation throughout without wheeze Cardiovascular: Regular rate and rhythm - 2/6  systolic murmur  Abdomen: Nontender, nondistended, soft, bowel sounds positive, no rebound, no ascites, no appreciable mass Extremities: No significant cyanosis, clubbing, 1+ edema bilateral lower extremities  Data Reviewed: Basic Metabolic Panel:  Recent Labs Lab 10/08/12 1922 10/08/12 1935 10/09/12 0701 10/10/12 0703 10/11/12 0530 10/12/12 0633  NA  --  138 137 133* 133* 129*  K  --  3.5 3.5 4.0 3.6 3.6  CL  --  99 97 95* 96 93*  CO2  --  29 31 31 28 27   GLUCOSE  --  154* 117* 85 99  92  BUN  --  17 24* 36* 20 34*  CREATININE  --  1.28* 1.76* 2.50* 1.63* 2.35*  CALCIUM  --  9.1 10.1 9.9 9.1 8.9  MG 2.2  --   --   --   --   --   PHOS  --   --   --  4.1  --  2.9   Liver Function Tests:  Recent Labs Lab 10/10/12 0703 10/12/12 0633  ALBUMIN 2.4* 2.4*   CBC:  Recent Labs Lab 10/08/12 1935 10/09/12 0701 10/09/12 0955 10/10/12 0704 10/11/12 0555 10/12/12 0635  WBC 13.8* 8.8 7.6 8.6 8.0 9.2   NEUTROABS 12.6*  --   --   --   --   --   HGB 8.8* 9.2* 9.6* 8.5* 8.7* 7.9*  HCT 25.8* 27.8* 29.1* 25.6* 26.2* 23.8*  MCV 87.2 89.4 90.1 89.2 90.0 89.8  PLT 120* 118* 101* 116* 123* 125*   Cardiac Enzymes:  Recent Labs Lab 10/08/12 1935 10/09/12 0955 10/09/12 1503 10/09/12 2105  TROPONINI 5.67* 14.39* 12.42* 8.70*   CBG:  Recent Labs Lab 10/09/12 0353 10/11/12 1246 10/11/12 1645 10/11/12 2125 10/12/12 1145  GLUCAP 110* 83 112* 161* 73    Recent Results (from the past 240 hour(s))  MRSA PCR SCREENING     Status: None   Collection Time    10/09/12  1:28 AM      Result Value Range Status   MRSA by PCR NEGATIVE  NEGATIVE Final   Comment:            The GeneXpert MRSA Assay (FDA     approved for NASAL specimens     only), is one component of a     comprehensive MRSA colonization     surveillance program. It is not     intended to diagnose MRSA     infection nor to guide or     monitor treatment for     MRSA infections.     Studies:  Recent x-ray studies have been reviewed in detail by the Attending Physician  Scheduled Meds:  Scheduled Meds: . amiodarone  200 mg Oral BID  . aspirin EC  81 mg Oral Daily  . carvedilol  4.6875 mg Oral BID WC  . cinacalcet  60 mg Oral Q breakfast  . darbepoetin (ARANESP) injection - DIALYSIS  100 mcg Intravenous Q Mon-HD  . [START ON 10/19/2012] darbepoetin (ARANESP) injection - DIALYSIS  200 mcg Intravenous Q Wed-HD  . docusate sodium  100 mg Oral BID  . feeding supplement (NEPRO CARB STEADY)  237 mL Oral TID BM  . methadone  5 mg Oral QID  . multivitamin  1 tablet Oral QPC supper  . nitroGLYCERIN  0.5 inch Topical Q6H  . pantoprazole  40 mg Oral Daily  . ranolazine  500 mg Oral Daily  . sevelamer carbonate  800 mg Oral TID WC  . simvastatin  5 mg Oral q1800  . sodium chloride  10 mL Intravenous Q12H  . sodium chloride  3 mL Intravenous Q12H   Continuous Infusions:    Time spent on care of this patient: 25  mins   Rhetta Mura  [facility] Office  [phone] Pager - Text Page per Loretha Stapler as per below:  On-Call/Text Page:      [email or web address]      password [email or web address]  If 7PM-7AM, please contact night-coverage [email or web address] Password [email or web address] 10/12/2012, 1:21 PM   LOS: 4 days

## 2012-10-12 NOTE — Progress Notes (Signed)
[facility] Progress Note  Subjective:   C/o coughing and can't eat hospital food- I never noticed that cough was that severe- seems like fluid  Objective Filed Vitals:   10/12/12 0630 10/12/12 0700 10/12/12 0730 10/12/12 0800  BP: 92/60 100/67 135/62 101/61  Pulse: 79 78 72 74  Temp:      TempSrc:      Resp:      Height:      Weight:      SpO2:  98% 98%    Physical Exam General: coughing on HD Heart: RRR Lungs: scattered wheezes, coarse BS Abdomen: soft Extremities: distal LE pitting edema Dialysis Access: right I-J  Assessment/Plan: 1.  SVT- req cardioversion, has remained in NSR- pos troponin - cards on board. They have her on heparin- EF of 10%; on amiodarone, low dose coreg bid  2.  NSTEMI -s/p heart cath; plan continue Rx for VT and CAD per cards 3.  ESRD - K 3.6 - on 4 K bath - extra treatment today for volume; UF 1.9 last pre weight 46.4. outpt schedule is TTS [facility]- Hd tomorrow to keep on schedule with gentle volume removal; 4.  Anemia- hgb 7.9; overall downward trend; was on 200 aranesp during recent admission; have given an additional 100 today then change to 200 q week; recheck Fe studies 5.  Secondary hyperparathyroidism- on sensipar, renvela, nephrovite. Phos is good  6.  HTN/volume- edema but just on distal legs Trying to remove but BP/cardiomyopathy will be issue- . on coreg and ranexa  Cough not improving - may need to repeat CXR. 7. Thrombocytopenia - stable 120s 8,. Nutrition - alb 2.4; heart healthy diet + nepro- changed diet to regular 7.  Dispo- pt has a good understanding of things it seems. She wants limited code, no intubation but this isolated arrythmia that responded to cardioversion and antiarrythmics she would want.   Sheffield Slider, PA-C [facility] Beeper [phone] 10/12/2012,8:20 AM  LOS: 4 days   Patient seen and examined, agree with above note with above modifications.  62 year old chrinically ill BF, s/p episode  of SVT req cardioversion, got tweaking of meds and cardiac cath showing nothing reversible.  Is volume overloaded and much difficulty with volume removal due to low BP. ? Midodrine?- also needs PICC out prior to d/c home when OK'd by cards Annie Sable, MD 10/12/2012     Additional Objective Labs: Basic Metabolic Panel:  Recent Labs Lab 10/10/12 0703 10/11/12 0530 10/12/12 0633  NA 133* 133* 129*  K 4.0 3.6 3.6  CL 95* 96 93*  CO2 31 28 27   GLUCOSE 85 99 92  BUN 36* 20 34*  CREATININE 2.50* 1.63* 2.35*  CALCIUM 9.9 9.1 8.9  PHOS 4.1  --  2.9   Liver Function Tests:  Recent Labs Lab 10/10/12 0703 10/12/12 0633  ALBUMIN 2.4* 2.4*  CBC:  Recent Labs Lab 10/08/12 1935 10/09/12 0701 10/09/12 0955 10/10/12 0704 10/11/12 0555 10/12/12 0635  WBC 13.8* 8.8 7.6 8.6 8.0 9.2  NEUTROABS 12.6*  --   --   --   --   --   HGB 8.8* 9.2* 9.6* 8.5* 8.7* 7.9*  HCT 25.8* 27.8* 29.1* 25.6* 26.2* 23.8*  MCV 87.2 89.4 90.1 89.2 90.0 89.8  PLT 120* 118* 101* 116* 123* 125*  Cardiac Enzymes:  Recent Labs Lab 10/08/12 1935 10/09/12 0955 10/09/12 1503 10/09/12 2105  TROPONINI 5.67* 14.39* 12.42* 8.70*   CBG:  Recent Labs Lab 10/09/12 0353  10/11/12 1246 10/11/12 1645 10/11/12 2125  GLUCAP 110* 83 112* 161*  Medications:   . amiodarone  200 mg Oral BID  . aspirin EC  81 mg Oral Daily  . carvedilol  4.6875 mg Oral BID WC  . cinacalcet  60 mg Oral Q breakfast  . darbepoetin (ARANESP) injection - DIALYSIS  100 mcg Intravenous Q Mon-HD  . darbepoetin (ARANESP) injection - DIALYSIS  100 mcg Intravenous Once  . [START ON 10/19/2012] darbepoetin (ARANESP) injection - DIALYSIS  200 mcg Intravenous Q Wed-HD  . docusate sodium  100 mg Oral BID  . feeding supplement (NEPRO CARB STEADY)  237 mL Oral TID BM  . methadone  5 mg Oral QID  . multivitamin  1 tablet Oral QPC supper  . nitroGLYCERIN  0.5 inch Topical Q6H  . pantoprazole  40 mg Oral Daily  . ranolazine  500 mg Oral  Daily  . sevelamer carbonate  800 mg Oral TID WC  . simvastatin  5 mg Oral q1800  . sodium chloride  10 mL Intravenous Q12H  . sodium chloride  3 mL Intravenous Q12H

## 2012-10-12 NOTE — Progress Notes (Addendum)
[facility]  DAILY PROGRESS NOTE   Subjective:  No events overnight. Persistent cough and LE edema. Tolerated HD today, but has had problems with hypotension. H/H decreased.  PRBC's are ordered.  No NSVT or device firings noted since re-programming.  On amiodarone and low-dose coreg.  Objective:  Temp:  [97.5 F (36.4 C)-98.6 F (37 C)] 98 F (36.7 C) (04/30 1041) Pulse Rate:  [59-90] 71 (04/30 1041) Resp:  [15-20] 20 (04/30 1041) BP: (77-135)/(56-104) 97/57 mmHg (04/30 1041) SpO2:  [96 %-100 %] 96 % (04/30 1254) Weight:  [45.1 kg (99 lb 6.8 oz)-46.4 kg (102 lb 4.7 oz)] 45.1 kg (99 lb 6.8 oz) (04/30 1041) Weight change: 3.4 kg (7 lb 7.9 oz)  Intake/Output from previous day: 04/29 0701 - 04/30 0700 In: 184 [P.O.:120; I.V.:64] Out: -   Intake/Output from this shift: Total I/O In: -  Out: 1616 [Other:1616]  Medications: Current Facility-Administered Medications  Medication Dose Route Frequency Provider Last Rate Last Dose  . 0.9 %  sodium chloride infusion  250 mL Intravenous PRN Marykay Lex, MD      . acetaminophen (TYLENOL) tablet 650 mg  650 mg Oral Q6H PRN Ron Parker, MD       Or  . acetaminophen (TYLENOL) suppository 650 mg  650 mg Rectal Q6H PRN Ron Parker, MD      . acetaminophen (TYLENOL) tablet 650 mg  650 mg Oral Once Rhetta Mura, MD      . albuterol (PROVENTIL) (5 MG/ML) 0.5% nebulizer solution 2.5 mg  2.5 mg Nebulization Q4H PRN Cecille Aver, MD   2.5 mg at 10/12/12 1253  . ALPRAZolam Prudy Feeler) tablet 0.25 mg  0.25 mg Oral TID PRN Brittainy Simmons, PA-C      . amiodarone (PACERONE) tablet 200 mg  200 mg Oral BID Brittainy Simmons, PA-C   200 mg at 10/12/12 1115  . aspirin EC tablet 81 mg  81 mg Oral Daily Brittainy Simmons, PA-C   81 mg at 10/12/12 1115  . carvedilol (COREG) tablet 4.6875 mg  4.6875 mg Oral BID WC Brittainy Simmons, PA-C   4.6875 mg at 10/11/12 0849  . chlorpheniramine-HYDROcodone  (TUSSIONEX) 10-8 MG/5ML suspension 5 mL  5 mL Oral Q12H PRN Abelino Derrick, PA-C   5 mL at 10/12/12 0942  . cinacalcet (SENSIPAR) tablet 60 mg  60 mg Oral Q breakfast Brittainy Simmons, PA-C   60 mg at 10/11/12 0848  . darbepoetin (ARANESP) injection 100 mcg  100 mcg Intravenous Q Mon-HD Cecille Aver, MD   100 mcg at 10/10/12 1108  . [START ON 10/19/2012] darbepoetin (ARANESP) injection 200 mcg  200 mcg Intravenous Q Wed-HD Sheffield Slider, PA-C      . dextrose (GLUTOSE) 40 % oral gel 37.5 g  1 Tube Oral PRN Brittainy Simmons, PA-C      . docusate sodium (COLACE) capsule 100 mg  100 mg Oral BID Brittainy Simmons, PA-C   100 mg at 10/11/12 2209  . feeding supplement (NEPRO CARB STEADY) liquid 237 mL  237 mL Oral TID BM Saima Rizwan, MD   237 mL at 10/12/12 1400  . guaiFENesin (ROBITUSSIN) 100 MG/5ML solution 200 mg  200 mg Oral Q4H PRN Calvert Cantor, MD   200 mg at 10/12/12 1507  . heparin injection 900 Units  20 Units/kg Dialysis PRN Cecille Aver, MD      . HYDROmorphone (DILAUDID) injection 0.5-1 mg  0.5-1 mg Intravenous Q3H PRN Harvette C  Lovell Sheehan, MD   0.5 mg at 10/12/12 0732  . HYDROmorphone (DILAUDID) tablet 2 mg  2 mg Oral Q6H PRN Brittainy Simmons, PA-C      . methadone (DOLOPHINE) tablet 5 mg  5 mg Oral QID Brittainy Simmons, PA-C   5 mg at 10/12/12 1501  . multivitamin (RENA-VIT) tablet 1 tablet  1 tablet Oral QPC supper Calvert Cantor, MD   1 tablet at 10/11/12 1859  . nitroGLYCERIN (NITROGLYN) 2 % ointment 0.5 inch  0.5 inch Topical Q6H Ron Parker, MD   0.5 inch at 10/12/12 1248  . ondansetron (ZOFRAN) tablet 4 mg  4 mg Oral Q6H PRN Ron Parker, MD       Or  . ondansetron (ZOFRAN) injection 4 mg  4 mg Intravenous Q6H PRN Ron Parker, MD   4 mg at 10/09/12 0220  . oxyCODONE (Oxy IR/ROXICODONE) immediate release tablet 5 mg  5 mg Oral Q4H PRN Ron Parker, MD      . pantoprazole (PROTONIX) EC tablet 40 mg  40 mg Oral Daily Brittainy Simmons, PA-C    40 mg at 10/12/12 1122  . ranolazine (RANEXA) 12 hr tablet 500 mg  500 mg Oral Daily Brittainy Simmons, PA-C   500 mg at 10/12/12 1115  . sevelamer carbonate (RENVELA) tablet 800 mg  800 mg Oral TID WC Brittainy Simmons, PA-C   800 mg at 10/12/12 1248  . simvastatin (ZOCOR) tablet 5 mg  5 mg Oral q1800 Brittainy Simmons, PA-C   5 mg at 10/11/12 1859  . sodium chloride 0.9 % injection 10 mL  10 mL Intravenous Q12H Calvert Cantor, MD   10 mL at 10/10/12 2137  . sodium chloride 0.9 % injection 10-40 mL  10-40 mL Intracatheter PRN Ron Parker, MD   20 mL at 10/12/12 1554  . sodium chloride 0.9 % injection 3 mL  3 mL Intravenous Q12H Marykay Lex, MD      . sodium chloride 0.9 % injection 3 mL  3 mL Intravenous PRN Marykay Lex, MD      . zolpidem (AMBIEN) tablet 5 mg  5 mg Oral QHS PRN Ron Parker, MD        Physical Exam: General appearance: alert and no distress Neck: JVD - 5 cm above sternal notch, no adenopathy, no carotid bruit, supple, symmetrical, trachea midline and thyroid not enlarged, symmetric, no tenderness/mass/nodules Lungs: diminished breath sounds bibasilar and rales bibasilar Heart: regular rate and rhythm, S1, S2 normal and systolic murmur: holosystolic 2/6, low pitch at apex Abdomen: soft, non-tender; bowel sounds normal; no masses,  no organomegaly and scaphoid Extremities: edema 2+ pitting edema Pulses: faint pulses Skin: Skin color, texture, turgor normal. No rashes or lesions Neurologic: Mental status: Alert, oriented, thought content appropriate, follows commands, no gross motor or sensory defecits  Lab Results: Results for orders placed during the hospital encounter of 10/08/12 (from the past 48 hour(s))  HEPARIN LEVEL (UNFRACTIONATED)     Status: Abnormal   Collection Time    10/10/12  8:33 PM      Result Value Range   Heparin Unfractionated 0.19 (*) 0.30 - 0.70 IU/mL   Comment:            IF HEPARIN RESULTS ARE BELOW     EXPECTED VALUES, AND  PATIENT     DOSAGE HAS BEEN CONFIRMED,     SUGGEST FOLLOW UP TESTING     OF ANTITHROMBIN III LEVELS.  BASIC METABOLIC PANEL  Status: Abnormal   Collection Time    10/11/12  5:30 AM      Result Value Range   Sodium 133 (*) 135 - 145 mEq/L   Potassium 3.6  3.5 - 5.1 mEq/L   Chloride 96  96 - 112 mEq/L   CO2 28  19 - 32 mEq/L   Glucose, Bld 99  70 - 99 mg/dL   BUN 20  6 - 23 mg/dL   Comment: DELTA CHECK NOTED   Creatinine, Ser 1.63 (*) 0.50 - 1.10 mg/dL   Comment: DELTA CHECK NOTED   Calcium 9.1  8.4 - 10.5 mg/dL   GFR calc non Af Amer 33 (*) >90 mL/min   GFR calc Af Amer 38 (*) >90 mL/min   Comment:            The eGFR has been calculated     using the CKD EPI equation.     This calculation has not been     validated in all clinical     situations.     eGFR's persistently     <90 mL/min signify     possible Chronic Kidney Disease.  HEPARIN LEVEL (UNFRACTIONATED)     Status: Abnormal   Collection Time    10/11/12  5:55 AM      Result Value Range   Heparin Unfractionated 0.23 (*) 0.30 - 0.70 IU/mL   Comment:            IF HEPARIN RESULTS ARE BELOW     EXPECTED VALUES, AND PATIENT     DOSAGE HAS BEEN CONFIRMED,     SUGGEST FOLLOW UP TESTING     OF ANTITHROMBIN III LEVELS.  CBC     Status: Abnormal   Collection Time    10/11/12  5:55 AM      Result Value Range   WBC 8.0  4.0 - 10.5 K/uL   RBC 2.91 (*) 3.87 - 5.11 MIL/uL   Hemoglobin 8.7 (*) 12.0 - 15.0 g/dL   HCT 16.1 (*) 09.6 - 04.5 %   MCV 90.0  78.0 - 100.0 fL   MCH 29.9  26.0 - 34.0 pg   MCHC 33.2  30.0 - 36.0 g/dL   RDW 40.9 (*) 81.1 - 91.4 %   Platelets 123 (*) 150 - 400 K/uL  PROTIME-INR     Status: Abnormal   Collection Time    10/11/12  5:55 AM      Result Value Range   Prothrombin Time 16.3 (*) 11.6 - 15.2 seconds   INR 1.34  0.00 - 1.49  POCT ACTIVATED CLOTTING TIME     Status: None   Collection Time    10/11/12 12:14 PM      Result Value Range   Activated Clotting Time 160    GLUCOSE, CAPILLARY      Status: None   Collection Time    10/11/12 12:46 PM      Result Value Range   Glucose-Capillary 83  70 - 99 mg/dL  GLUCOSE, CAPILLARY     Status: Abnormal   Collection Time    10/11/12  4:45 PM      Result Value Range   Glucose-Capillary 112 (*) 70 - 99 mg/dL  GLUCOSE, CAPILLARY     Status: Abnormal   Collection Time    10/11/12  9:25 PM      Result Value Range   Glucose-Capillary 161 (*) 70 - 99 mg/dL  RENAL FUNCTION PANEL     Status: Abnormal  Collection Time    10/12/12  6:33 AM      Result Value Range   Sodium 129 (*) 135 - 145 mEq/L   Potassium 3.6  3.5 - 5.1 mEq/L   Chloride 93 (*) 96 - 112 mEq/L   CO2 27  19 - 32 mEq/L   Glucose, Bld 92  70 - 99 mg/dL   BUN 34 (*) 6 - 23 mg/dL   Creatinine, Ser 1.61 (*) 0.50 - 1.10 mg/dL   Calcium 8.9  8.4 - 09.6 mg/dL   Phosphorus 2.9  2.3 - 4.6 mg/dL   Albumin 2.4 (*) 3.5 - 5.2 g/dL   GFR calc non Af Amer 21 (*) >90 mL/min   GFR calc Af Amer 24 (*) >90 mL/min   Comment:            The eGFR has been calculated     using the CKD EPI equation.     This calculation has not been     validated in all clinical     situations.     eGFR's persistently     <90 mL/min signify     possible Chronic Kidney Disease.  CBC     Status: Abnormal   Collection Time    10/12/12  6:35 AM      Result Value Range   WBC 9.2  4.0 - 10.5 K/uL   RBC 2.65 (*) 3.87 - 5.11 MIL/uL   Hemoglobin 7.9 (*) 12.0 - 15.0 g/dL   HCT 04.5 (*) 40.9 - 81.1 %   MCV 89.8  78.0 - 100.0 fL   MCH 29.8  26.0 - 34.0 pg   MCHC 33.2  30.0 - 36.0 g/dL   RDW 91.4 (*) 78.2 - 95.6 %   Platelets 125 (*) 150 - 400 K/uL  FERRITIN     Status: Abnormal   Collection Time    10/12/12  7:42 AM      Result Value Range   Ferritin 2347 (*) 10 - 291 ng/mL   Comment: (NOTE)     Result repeated and verified.     Result confirmed by automatic dilution.  IRON AND TIBC     Status: Abnormal   Collection Time    10/12/12  7:42 AM      Result Value Range   Iron 10 (*) 42 - 135  ug/dL   TIBC 213 (*) 086 - 578 ug/dL   Saturation Ratios 6 (*) 20 - 55 %   UIBC 150  125 - 400 ug/dL  GLUCOSE, CAPILLARY     Status: None   Collection Time    10/12/12 11:45 AM      Result Value Range   Glucose-Capillary 73  70 - 99 mg/dL   Comment 1 Notify RN      Imaging: No results found.  Assessment:  1. Principal Problem: 2.   NSTEMI (non-ST elevated myocardial infarction) 3. Active Problems: 4.   Ischemic cardiomyopathy, EF 20-25% echo April 2013, now 10% by cath (05/06/12) 5.   Chronic systolic congestive heart failure, NYHA class 3 6.   LBBB (left bundle branch block) 7.   Pancreatitis, chronic 8.   Esophageal varices in cirrhosis, admitted June 2013 to [facility] 9.   ESRD on hemodialysis 10.   CAD, CABG X 4 Feb '08, LAD stent June '08 11.   ICD (implantable cardiac defibrillator) in place, MDT placed Jan 2013 [facility] 12.   Hypotension 13.   Ventricular tachycardia, sustained -- s/p DCCV in  ER 14.   Plan:  1. Continues to have clinically decompensated heart failure - off the Starling curve. Agree with volume removal as can be tolerated by blood pressure. May need low dose inotrope therapy, however, milrinone may cause more hypotension and dobutamine may lead to more tachyarrhythmias.   Consider starting low-dose levophed to support bp for diuresis as she remains volume overloaded with a markedly reduced EF and stroke volume. Reduce coreg to 3.125 BID. Check co-ox. She would likely have to move to 2900 stepdown for inotrope therapy.  Also, would suggest once blood is crossmatched and typed, to consider giving it tomorrow during dialysis which is much easier.  See Dr. Renaye Rakers note - may need EP input for ?why she was not programmed to bi-V pace and/or is bi-v pacing deleterious for her.    Time Spent Directly with Patient:  15 minutes  Length of Stay:  LOS: 4 days   Chrystie Nose, MD, [facility] Attending Cardiologist [facility]  Javious Hallisey  C 10/12/2012, 4:21 PM

## 2012-10-13 LAB — RENAL FUNCTION PANEL
Albumin: 2.3 g/dL — ABNORMAL LOW (ref 3.5–5.2)
BUN: 23 mg/dL (ref 6–23)
CO2: 29 mEq/L (ref 19–32)
Calcium: 9 mg/dL (ref 8.4–10.5)
Chloride: 98 mEq/L (ref 96–112)
Creatinine, Ser: 1.56 mg/dL — ABNORMAL HIGH (ref 0.50–1.10)
GFR calc Af Amer: 40 mL/min — ABNORMAL LOW (ref >90)
GFR calc non Af Amer: 34 mL/min — ABNORMAL LOW (ref >90)
Glucose, Bld: 134 mg/dL — ABNORMAL HIGH (ref 70–99)
Phosphorus: 2.3 mg/dL (ref 2.3–4.6)
Potassium: 3.9 mEq/L (ref 3.5–5.1)
Sodium: 134 mEq/L — ABNORMAL LOW (ref 135–145)

## 2012-10-13 LAB — CBC
HCT: 24.3 % — ABNORMAL LOW (ref 36.0–46.0)
MCH: 29.6 pg (ref 26.0–34.0)
MCV: 90 fL (ref 78.0–100.0)
RDW: 18 % — ABNORMAL HIGH (ref 11.5–15.5)
WBC: 5.7 10*3/uL (ref 4.0–10.5)

## 2012-10-13 MED ORDER — CARVEDILOL 3.125 MG PO TABS: 4.6875 mg | ORAL_TABLET | Freq: Two times a day (BID) | ORAL | Status: DC

## 2012-10-13 MED ORDER — NITROGLYCERIN 0.4 MG SL SUBL: 0.4000 mg | SUBLINGUAL_TABLET | SUBLINGUAL | Status: AC | PRN

## 2012-10-13 MED ORDER — AMIODARONE HCL 200 MG PO TABS: 200.0000 mg | ORAL_TABLET | Freq: Two times a day (BID) | ORAL | Status: DC

## 2012-10-13 NOTE — Progress Notes (Signed)
[facility] Progress Note  Subjective:   Seen on HD today- nebs are helping her coughing. Dr. Blanchie Dessert note suggests levophed and possible EP consult but I am not sure any plans have been made for either.  Ms. Lucianne Lei interpreted their converstation that she could go home after the blood ?   Objective Filed Vitals:   10/13/12 0716 10/13/12 0735 10/13/12 0736 10/13/12 0745  BP: 94/64   90/54  Pulse: 72 70  68  Temp:      TempSrc:      Resp: 17 16  20   Height:      Weight:      SpO2: 100%  100% 98%   Physical Exam General: coughing on HD- better Heart: RRR Lungs: scattered wheezes, coarse BS Abdomen: soft Extremities: distal LE pitting edema Dialysis Access: right I-J  Assessment/Plan: 1.  SVT- req cardioversion, has remained in NSR- pos troponin - cards on board.  EF of 10%; on amiodarone, low dose coreg bid.  Considering other things but nothing definite as of yet.  2.  NSTEMI -s/p heart cath; plan continue Rx for VT and CAD per cards 3.  ESRD - K 3.6 - on 4 K bath - outpt schedule is TTS [facility]- got it Mon, Tuesday and planning for today and Saturday.  Major issue is being able to UF given low BP.  Pt is willing to do 4 time a week dialysis as OP if it is needed.   4.  Anemia- hgb 7.9; overall downward trend; max aranesp . Planning for transfusion today which I hope will help BP 5.  Secondary hyperparathyroidism- on sensipar, renvela, nephrovite. Phos is good - actually dropping- will d/c renvela and watch 6.  HTN/volume- edema but just on distal legs Trying to remove but BP/cardiomyopathy will be issue- . on coreg and ranexa  Cough not improving - may need to repeat CXR.  UF as able with HD, hoping blood will help BP and ability to UF 7. Thrombocytopenia - stable 120s 8,. Nutrition - alb 2.4; heart healthy diet + nepro- changed diet to regular 7.  Dispo- pt has a good understanding of things it seems. She wants limited code, no intubation but this isolated arrythmia that  responded to cardioversion and antiarrythmics she would want.   Iain Sawchuk A   10/13/2012,7:54 AM  LOS: 5 days   10/13/2012     Additional Objective Labs: Basic Metabolic Panel:  Recent Labs Lab 10/10/12 0703 10/11/12 0530 10/12/12 0633 10/13/12 0648  NA 133* 133* 129* 134*  K 4.0 3.6 3.6 3.9  CL 95* 96 93* 98  CO2 31 28 27 29   GLUCOSE 85 99 92 134*  BUN 36* 20 34* 23  CREATININE 2.50* 1.63* 2.35* 1.56*  CALCIUM 9.9 9.1 8.9 9.0  PHOS 4.1  --  2.9 2.3   Liver Function Tests:  Recent Labs Lab 10/10/12 0703 10/12/12 0633 10/13/12 0648  ALBUMIN 2.4* 2.4* 2.3*  CBC:  Recent Labs Lab 10/08/12 1935  10/09/12 0955 10/10/12 0704 10/11/12 0555 10/12/12 0635 10/13/12 0600  WBC 13.8*  < > 7.6 8.6 8.0 9.2 5.7  NEUTROABS 12.6*  --   --   --   --   --   --   HGB 8.8*  < > 9.6* 8.5* 8.7* 7.9* 8.0*  HCT 25.8*  < > 29.1* 25.6* 26.2* 23.8* 24.3*  MCV 87.2  < > 90.1 89.2 90.0 89.8 90.0  PLT 120*  < > 101* 116* 123* 125* 105*  < > =  values in this interval not displayed.Cardiac Enzymes:  Recent Labs Lab 10/08/12 1935 10/09/12 0955 10/09/12 1503 10/09/12 2105  TROPONINI 5.67* 14.39* 12.42* 8.70*   CBG:  Recent Labs Lab 10/11/12 1645 10/11/12 2125 10/12/12 1145 10/12/12 1621 10/12/12 2117  GLUCAP 112* 161* 73 175* 135*  Medications:   . acetaminophen  650 mg Oral Once  . amiodarone  200 mg Oral BID  . aspirin EC  81 mg Oral Daily  . carvedilol  4.6875 mg Oral BID WC  . cinacalcet  60 mg Oral Q breakfast  . darbepoetin (ARANESP) injection - DIALYSIS  100 mcg Intravenous Q Mon-HD  . [START ON 10/19/2012] darbepoetin (ARANESP) injection - DIALYSIS  200 mcg Intravenous Q Wed-HD  . docusate sodium  100 mg Oral BID  . feeding supplement (NEPRO CARB STEADY)  237 mL Oral TID BM  . methadone  5 mg Oral QID  . multivitamin  1 tablet Oral QPC supper  . nitroGLYCERIN  0.5 inch Topical Q6H  . pantoprazole  40 mg Oral Daily  . ranolazine  500 mg Oral Daily  .  sevelamer carbonate  800 mg Oral TID WC  . simvastatin  5 mg Oral q1800  . sodium chloride  10 mL Intravenous Q12H  . sodium chloride  3 mL Intravenous Q12H

## 2012-10-13 NOTE — Procedures (Signed)
Patient was seen on dialysis and the procedure was supervised.  BFR 400  Via PC BP is  90/54.   Patient appears to be tolerating treatment well- low BP limits UF  [name] A 10/13/2012

## 2012-10-13 NOTE — Progress Notes (Signed)
RN CM did receive a call from [place] with HD and she stated that pt wanted to change HD centers. CM did call the [facility] to make the CSW [name] aware of the change and dates. CM did also mention that pt will need transportation to HD and possible HH services with DME. CM will speak to pt after HD treatment. Gala Lewandowsky, RN,BSN [phone]

## 2012-10-13 NOTE — Progress Notes (Signed)
D/C midline IV per order.  Pt verbalizes understanding of dressing care, site care and interventions for bleeding and infection once home using teachback method.  No ADN.  No signs of bleeding or infection present.

## 2012-10-13 NOTE — Progress Notes (Signed)
10/13/2012 10:49 AM Hemodialysis Outpatient Note; per patient request a new outpatient schedule has been arranged for this patient. She will now be a Mon-Wed-Fri  2nd shift patient at the [facility] location on [facility] [address], ph # [phone].Patient stated she needed a new schedule due to transportation from the [place] mobility would no longer transport on a Saturday. Thank you. Tilman Neat

## 2012-10-13 NOTE — Discharge Summary (Signed)
Physician Discharge Summary  Patient ID: [name] MRN: [id number] DOB/AGE: [birth year] 62 y.o.  Admit date: 10/08/2012 Discharge date: 10/13/2012  Admission Diagnoses: Symptomatic, sustained VT  Discharge Diagnoses:  Principal Problem:   Ventricular tachycardia, sustained -- s/p DCCV in ER Active Problems:   Hypotension   Ischemic cardiomyopathy, EF 20-25% echo April 2013, now 10% by cath (05/06/12)   Chronic systolic congestive heart failure, NYHA class 3   LBBB (left bundle branch block)   Pancreatitis, chronic   Esophageal varices in cirrhosis, admitted June 2013 to [facility]   NSTEMI (non-ST elevated myocardial infarction)   ESRD on hemodialysis   CAD, CABG X 4 Feb '08, LAD stent June '08   ICD (implantable cardiac defibrillator) in place, MDT placed Jan 2013 [facility] [place]   Discharged Condition: stable  Hospital Course: [name] is a 62 y.o. female with severe ICM, EF ~20%, s/p CABG in '08 (LIMA-LAD, SVG-Diag, SVG-OM, SVG-rPDA), s/p BiV AICD and ESRD (HD: Tue, Thur, Sat) admitted to [facility] on 10/08/12 for symptomatic sustained ventricular tachycardia. In the ER, she underwent successful DCCV back to normal sinus rhythm. Her AICD, which is a [employer] Device, was reprogramed by Dr. Royann Shivers to zone 3 (VF at 200 bpm, VT at 170 bpm, monitor zone at 150). She also had an elevation in troponin to 14, but she denied any chest discomfort. She underwent a diagnostic LHC, by Dr. Herbie Baltimore, that demonstrated progressive CAD but without a true culprit lesion. It was decided to adjust her cardiac medications. During her hospitalization, nephrology was consulted and arranged inpatient hemodialysis. On hospital day 5, she was seen and examined by Dr. Royann Shivers.  She had no further VT and was without symptoms. Dr. Royann Shivers determined that she was stable for discharge home. She is scheduled to follow-up at [facility].   Consults: nephrology  Significant Diagnostic Studies:   LHC 10/11/12 Hemodynamics:   Central Aortic / Mean Pressures: 82/49 mmHg (62 mmHg); Left Ventricular Pressures / EDP: 81/8 mmHg, EDP 18 mmHg Left Ventriculography: Not performed  Coronary Anatomy:  Left Main: Large caliber, extensively calcified; trifurcates into LAD, Ramus Intermedius & Circumflex LAD: Moderate to large caliber, ostial-proximal ~60-70% prior to stent with mild ISR; competetive flow noted distally from LIMA.  D1: not seen; there is a stent noted, but no flow.  SVG-Diag is severely atretic with minimal distal flow in the grafted vessel. LIMA-LAD with ~60% (stable) anastomotic stenosis; LAD wraps the apex with diffuse mild to moderate stenosis.  Left Circumflex: moderate caliber, ~80% stenosis in proximal AVGroove prior to major branching OM with a proximal ~70-80% hazy lesion just prior to the SVG insertion. The remainder of the vessel is branches into several OM branches with minimal disease.  SVG-OM1: widely patent graft, inserts just distal to a stenosis, but provides brisk antegrade flow to a branching vessel & retrograde flow to a more inferior OM branch (despite the ~70-80% stenosis)  OM 2: fills by native flow & by retrograde flow from the SVG, minimal luminal irregularities. Ramus intermedius: small to moderate caliber, diffuse ~50% prior to bifurcation.  RCA: diffuse ~80% proximal prior to the 1st RV marginal; gives off a major RV marginal off of the genu that is at least moderate caliber & has ~80% stenosis; RCA bifurcates into Right Posterior AV Groove Branch (RPAV) & rPDA that has brisk competitive flow from the SVG.  SVG-rPDA: Widely patent, with minimal flow; retrograde fills the RPAV & distally to the RPL branches.  RPDA: Moderate to large caliber  vessel that fills via SVG, minimal distal irregularities.  RPL Sysytem:The RPAV is a moderate caliber vessel that gives off several RPL branches; minimla luminoal irregularities. POST-OPERATIVE DIAGNOSIS:  Three of 4 grafts patent with progression of  SVG-Diag atresia to an essentially non-functional conduit. Not much change from most recent cardiac catheterization.  No true "culprit lesion" noted to explain ACS -- suspect that the troponin elevation was Supply vs. Demand Ischemia (Type 2 MI with VT in setting of extensive existing CAD & dilated cardiomyopathy.  Native RCA has severe proximal stenosis & fills the rPL  Minimally elevated LVEDP for systemic BP -- not unexpected given her severe cardiomyopathy   Treatments: See Hospital Course  Discharge Exam: Blood pressure 96/56, pulse 77, temperature 98.2 F (36.8 C), temperature source Oral, resp. rate 18, height 5\' 4"  (1.626 m), weight 100 lb 5 oz (45.5 kg), SpO2 100.00%.   Disposition: 62-Long Term Care     Medication List    TAKE these medications       amiodarone 200 MG tablet  Commonly known as:  PACERONE  Take 1 tablet (200 mg total) by mouth 2 (two) times daily.     aspirin 81 MG EC tablet  Take 1 tablet (81 mg total) by mouth daily.     carvedilol 3.125 MG tablet  Commonly known as:  COREG  Take 1.5 tablets (4.6875 mg total) by mouth 2 (two) times daily with a meal.     cinacalcet 30 MG tablet  Commonly known as:  SENSIPAR  Take 2 tablets (60 mg total) by mouth daily with breakfast.     dextrose 40 % Gel  Commonly known as:  GLUTOSE  Take 37.5 g by mouth as needed (CBG less than 70 or CBG greater than 70 and next meal is more than 1 hours away).     DSS 100 MG Caps  Take 100 mg by mouth 2 (two) times daily.     feeding supplement (NEPRO CARB STEADY) Liqd  Take 237 mLs by mouth 2 (two) times daily with breakfast and lunch.     HYDROmorphone 2 MG tablet  Commonly known as:  DILAUDID  Take 1 tablet (2 mg total) by mouth every 6 (six) hours as needed (severe pain.). For pain     methadone 5 MG tablet  Commonly known as:  DOLOPHINE  Take 1 tablet (5 mg total) by mouth 4 (four) times daily.     multivitamin Tabs tablet  Take 1 tablet by mouth daily.      nitroGLYCERIN 0.4 MG SL tablet  Commonly known as:  NITROSTAT  Place 1 tablet (0.4 mg total) under the tongue every 5 (five) minutes as needed for chest pain.     pantoprazole 40 MG tablet  Commonly known as:  PROTONIX  Take 40 mg by mouth daily.     pravastatin 20 MG tablet  Commonly known as:  PRAVACHOL  Take 20 mg by mouth at bedtime.     ranolazine 500 MG 12 hr tablet  Commonly known as:  RANEXA  Take 500 mg by mouth daily.     sevelamer 400 MG tablet  Commonly known as:  RENAGEL  Take 1 tablet (400 mg total) by mouth 3 (three) times daily with meals. With a meal       TIME SPENT ON DISCHARGE INCLUDING PHYSICIAN TIME: >30 MINUTES  Signed: Allayne Butcher, PA-C 10/13/2012, 1:23 PM

## 2012-10-13 NOTE — Progress Notes (Signed)
[facility]  Subjective: She reports improvement in breathing    Objective: Vital signs in last 24 hours: Temp:  [97.6 F (36.4 C)-98.2 F (36.8 C)] 98.2 F (36.8 C) (05/01 1131) Pulse Rate:  [66-77] 77 (05/01 1131) Resp:  [16-24] 18 (05/01 1131) BP: (79-105)/(25-65) 96/56 mmHg (05/01 1131) SpO2:  [95 %-100 %] 100 % (05/01 1131) Weight:  [100 lb 5 oz (45.5 kg)-103 lb 13.4 oz (47.1 kg)] 100 lb 5 oz (45.5 kg) (05/01 1052) Last BM Date: 10/11/12  Intake/Output from previous day: 04/30 0701 - 05/01 0700 In: -  Out: 1616  Intake/Output this shift: Total I/O In: 350 [Blood:350] Out: 405 [Other:405]  Medications Current Facility-Administered Medications  Medication Dose Route Frequency Provider Last Rate Last Dose  . 0.9 %  sodium chloride infusion  250 mL Intravenous PRN Marykay Lex, MD      . acetaminophen (TYLENOL) tablet 650 mg  650 mg Oral Q6H PRN Ron Parker, MD       Or  . acetaminophen (TYLENOL) suppository 650 mg  650 mg Rectal Q6H PRN Ron Parker, MD      . acetaminophen (TYLENOL) tablet 650 mg  650 mg Oral Once Rhetta Mura, MD      . albuterol (PROVENTIL) (5 MG/ML) 0.5% nebulizer solution 2.5 mg  2.5 mg Nebulization Q4H PRN Cecille Aver, MD   2.5 mg at 10/13/12 0735  . ALPRAZolam Prudy Feeler) tablet 0.25 mg  0.25 mg Oral TID PRN Brittainy Simmons, PA-C      . amiodarone (PACERONE) tablet 200 mg  200 mg Oral BID Brittainy Simmons, PA-C   200 mg at 10/12/12 2220  . aspirin EC tablet 81 mg  81 mg Oral Daily Brittainy Simmons, PA-C   81 mg at 10/12/12 1115  . camphor-menthol (SARNA) lotion   Topical PRN Roma Kayser Schorr, NP      . carvedilol (COREG) tablet 4.6875 mg  4.6875 mg Oral BID WC Robbie Lis, PA-C   4.6875 mg at 10/12/12 1720  . chlorpheniramine-HYDROcodone (TUSSIONEX) 10-8 MG/5ML suspension 5 mL  5 mL Oral Q12H PRN Abelino Derrick, PA-C   5 mL at 10/12/12 0942  . cinacalcet (SENSIPAR) tablet 60 mg   60 mg Oral Q breakfast Brittainy Simmons, PA-C   60 mg at 10/11/12 0848  . darbepoetin (ARANESP) injection 100 mcg  100 mcg Intravenous Q Mon-HD Cecille Aver, MD   100 mcg at 10/10/12 1108  . [START ON 10/19/2012] darbepoetin (ARANESP) injection 200 mcg  200 mcg Intravenous Q Wed-HD Sheffield Slider, PA-C      . dextrose (GLUTOSE) 40 % oral gel 37.5 g  1 Tube Oral PRN Brittainy Simmons, PA-C      . docusate sodium (COLACE) capsule 100 mg  100 mg Oral BID Brittainy Simmons, PA-C   100 mg at 10/12/12 2220  . feeding supplement (NEPRO CARB STEADY) liquid 237 mL  237 mL Oral TID BM Calvert Cantor, MD   237 mL at 10/12/12 2106  . guaiFENesin (ROBITUSSIN) 100 MG/5ML solution 200 mg  200 mg Oral Q4H PRN Calvert Cantor, MD   200 mg at 10/12/12 1507  . heparin injection 900 Units  20 Units/kg Dialysis PRN Cecille Aver, MD   900 Units at 10/13/12 1610  . HYDROmorphone (DILAUDID) injection 0.5-1 mg  0.5-1 mg Intravenous Q3H PRN Ron Parker, MD   0.5 mg at 10/13/12 1035  . HYDROmorphone (DILAUDID) tablet 2 mg  2 mg  Oral Q6H PRN Robbie Lis, PA-C      . methadone (DOLOPHINE) tablet 5 mg  5 mg Oral QID Brittainy Simmons, PA-C   5 mg at 10/12/12 2220  . multivitamin (RENA-VIT) tablet 1 tablet  1 tablet Oral QPC supper Calvert Cantor, MD   1 tablet at 10/12/12 1718  . nitroGLYCERIN (NITROGLYN) 2 % ointment 0.5 inch  0.5 inch Topical Q6H Ron Parker, MD   0.5 inch at 10/13/12 0036  . ondansetron (ZOFRAN) tablet 4 mg  4 mg Oral Q6H PRN Ron Parker, MD       Or  . ondansetron (ZOFRAN) injection 4 mg  4 mg Intravenous Q6H PRN Ron Parker, MD   4 mg at 10/09/12 0220  . oxyCODONE (Oxy IR/ROXICODONE) immediate release tablet 5 mg  5 mg Oral Q4H PRN Ron Parker, MD      . pantoprazole (PROTONIX) EC tablet 40 mg  40 mg Oral Daily Brittainy Simmons, PA-C   40 mg at 10/12/12 1122  . ranolazine (RANEXA) 12 hr tablet 500 mg  500 mg Oral Daily Brittainy Simmons, PA-C   500 mg  at 10/12/12 1115  . simvastatin (ZOCOR) tablet 5 mg  5 mg Oral q1800 Brittainy Simmons, PA-C   5 mg at 10/12/12 1718  . sodium chloride 0.9 % injection 10 mL  10 mL Intravenous Q12H Calvert Cantor, MD   10 mL at 10/10/12 2137  . sodium chloride 0.9 % injection 10-40 mL  10-40 mL Intracatheter PRN Ron Parker, MD   20 mL at 10/12/12 1554  . sodium chloride 0.9 % injection 3 mL  3 mL Intravenous Q12H Marykay Lex, MD      . sodium chloride 0.9 % injection 3 mL  3 mL Intravenous PRN Marykay Lex, MD      . zolpidem [facility]) tablet 5 mg  5 mg Oral QHS PRN Ron Parker, MD        PE: General appearance: alert, cooperative and no distress Neck: + JVD.  Veins buldging. Lungs: Decreased BS bilaterally Heart: regular rate and rhythm and 1/6 sys MM Extremities: 2+ LEE Pulses: 1+ DP pulses.  0+ radials. Neurologic: Grossly normal  Lab Results:   Recent Labs  10/11/12 0555 10/12/12 0635 10/13/12 0600  WBC 8.0 9.2 5.7  HGB 8.7* 7.9* 8.0*  HCT 26.2* 23.8* 24.3*  PLT 123* 125* 105*   BMET  Recent Labs  10/11/12 0530 10/12/12 0633 10/13/12 0648  NA 133* 129* 134*  K 3.6 3.6 3.9  CL 96 93* 98  CO2 28 27 29   GLUCOSE 99 92 134*  BUN 20 34* 23  CREATININE 1.63* 2.35* 1.56*  CALCIUM 9.1 8.9 9.0   PT/INR  Recent Labs  10/11/12 0555  LABPROT 16.3*  INR 1.34   Assessment/Plan   Principal Problem:   NSTEMI (non-ST elevated myocardial infarction) Active Problems:   Ischemic cardiomyopathy, EF 20-25% echo April 2013, now 10% by cath (05/06/12)   Chronic systolic congestive heart failure, NYHA class 3   LBBB (left bundle branch block)   Pancreatitis, chronic   Esophageal varices in cirrhosis, admitted June 2013 to [facility]   ESRD on hemodialysis   CAD, CABG X 4 Feb '08, LAD stent June '08   ICD (implantable cardiac defibrillator) in place, MDT placed Jan 2013 [facility]   Hypotension   Ventricular tachycardia, sustained -- s/p DCCV in ER  Plan:  Dialysis was  performed today.  - in dialysis.  Hypotensive.  Hgb low, 8.0 but stable.  One unit PRBCs given in HD.  She is still volume overloaded with no urine output.  She needs continued volume removal during HD.  Dr. Royann Shivers to see.  TCM 7 follow up.   LOS: 5 days    [name] 10/13/2012 11:44 AM  I have seen and examined the patient along with [name], [name], PA.  I have reviewed the chart, notes and new data.  I agree with NP's note.  Key new complaints: breathing is close to her baseline Key examination changes: no apparent problems with HD today, although she runs a low blood pressure Key new findings / data: [facility] so [facility].  PLAN: DC home today. She is a complex patient with advanced chronic liver failure, ESRD on dialysis, very severe cardiomyopathy and a recent episode of sustained VT with hemodynamic instability that required emergency cardioversion.  Prognosis is poor and risk of decompensation and repeat hospitalization is high. Multiple changes have been made to her medications and device settings. She has a history of imperfect compliance with meds and follow up. She will be contacted by phone in next 24-48 hours and we will see back in the office in 7 days or less.  We reviewed ICD "shock"plan. There are no good antiarrhythmic choices for this complex patient.  Thurmon Fair, MD, [facility] [phone] 10/13/2012, 1:17 PM

## 2012-10-13 NOTE — CV Procedure (Signed)
[facility] CARDIAC CATHETERIZATIONREPORT  NAME:  [name]   MRN: [id number] DOB:  16-Mar-1951   ADMIT DATE: 10/08/2012 Procedure Date: 10/13/2012  INTERVENTIONAL CARDIOLOGIST: Marykay Lex, M.D., MS PRIMARY CARE PROVIDER: Jyl Heinz, MD PRIMARY CARDIOLOGIST: Unclear  PATIENT:  [name] is a 62 y.o. female with severe ICM, EF ~20%, s/bo CABG in '08 (LIMA-LAD, SVG-Diag, SVG-OM, SVG-rPDA), s/p BiV AICD admitted after DCCV of Sustained VT. Denied any true angina, but did note R thumb discomfort. AICD adjusted, but had Troponin level elevated up to 14. Referred for invasive evaluation.  PRE-OPERATIVE DIAGNOSIS:    NSTEMI  Severe Ischemic Dilated Cardiomyopathy  Ventricular Tachycardia  PROCEDURES PERFORMED:    LEFT HEART CATHETERIZATION WITH CORONARY/GRAFT ANGIOGRAM (N/A)  PROCEDURE:Consent:  Risks of procedure as well as the alternatives and risks of each were explained to the (patient/caregiver).  Consent for procedure obtained. Consent for signed by MD and patient with RN witness -- placed on chart.  PROCEDURE: The patient was brought to the 2nd Floor [facility] in the fasting state and prepped and draped in the usual sterile fashion for Right groin or radial access. A modified Allen's test with plethysmography was performed, revealing excellent Ulnar artery collateral flow.  Sterile technique was used including antiseptics, cap, gloves, gown, hand hygiene, mask and sheet.  Skin prep: Chlorhexidine.  Time Out: Verified patient identification, verified procedure, site/side was marked, verified correct patient position, special equipment/implants available, medications/allergies/relevent history reviewed, required imaging and test results available.  Performed  Access: Right Common Femoral Artery; 5 Fr Sheath -- fluoroscopically guided modified Seldinger technique   Diagnostic: 5 Fr catheters advanced & exchanged over  J-wire.  Left Coronary Artery Angiography: JL4  Right Coronary Artery, SVG-OM, SVG-RCA Angiography: JR4  LIMA-LAD and SVG-Diag, LV Hemodynamics : IMA   Sheat:  Removed in holding area with manual pressure for hemostasis.   MEDICATIONS:  Anesthesia:  Local Lidocaine 10 ml  Sedation:  1 mg IV Versed  Omnipaque Contrast: 70 ml  250 ml IV NS bolus  Hemodynamics:  Central Aortic / Mean Pressures: 82/49 mmHg (62 mmHg);  Left Ventricular Pressures / EDP: 81/8 mmHg, EDP 18 mmHg  Left Ventriculography: Not performed  Coronary Anatomy:  Left Main: Large caliber, extensively calcified; trifurcates into LAD, Ramus Intermedius & Circumflex LAD: Moderate to large caliber, ostial-proximal ~60-70% prior to stent with mild ISR; competetive flow noted distally from LIMA.  D1: not seen; there is a stent noted, but no flow.    SVG-Diag is severely atretic with minimal distal flow in the grafted vessel. LIMA-LAD with ~60% (stable) anastomotic stenosis; LAD wraps the apex with diffuse mild to moderate stenosis.  Left Circumflex: moderate caliber, ~80% stenosis in proximal AVGroove prior to major branching OM with a proximal ~70-80% hazy lesion just prior to the SVG insertion.  The remainder of the vessel is branches into several OM branches with minimal disease.  SVG-OM1: widely patent graft, inserts just distal to a stenosis, but provides brisk antegrade flow to a branching vessel & retrograde flow to a more inferior OM branch (despite the ~70-80% stenosis)   OM 2: fills by native flow & by retrograde flow from the SVG, minimal luminal irregularities. Ramus intermedius: small to moderate caliber, diffuse ~50% prior to bifurcation.  RCA: diffuse ~80% proximal prior to the 1st RV marginal; gives off a major RV marginal off of the genu that is at least moderate caliber & has ~80% stenosis; RCA bifurcates  into Right Posterior AV Groove Branch (RPAV) & rPDA that has brisk competitive flow from  the SVG.   SVG-rPDA:  Widely patent, with minimal flow; retrograde fills the RPAV & distally to the RPL branches.   RPDA: Moderate to large caliber vessel that fills via SVG, minimal distal irregularities.  RPL Sysytem:The RPAV is a moderate caliber vessel that gives off several RPL branches; minimla luminoal irregularities.  PATIENT DISPOSITION:    The patient was transferred to the PACU holding area in a hemodynamicaly stable, chest pain free condition.  The patient tolerated the procedure well, and there were no complications.  EBL:   < 10 ml  The patient was stable before, during, and after the procedure.  POST-OPERATIVE DIAGNOSIS:    Three of 4 grafts patent with progression of SVG-Diag atresia to an essentially non-functional conduit.  Not much change from most recent cardiac catheterization.  No true "culprit lesion" noted to explain ACS -- suspect that the troponin elevation was Supply vs. Demand Ischemia (Type 2 MI with VT in setting of extensive existing CAD & dilated cardiomyopathy.  Native RCA has severe proximal stenosis & fills the rPL   Minimally elevated LVEDP for systemic BP -- not unexpected given her severe cardiomyopathy  PLAN OF CARE:  Return to telemetry for continued adjustment of cardiac medications.  May need EP assistance to optimize BiVICD.    LV EDP is not grossly elevated, but higher than would be expected with systemic hypotension.  BP is not sufficient to allow much "optimization of medical therapy" -- was started on Ranexa, however this would need to be monitored in a HD patient.   Marykay Lex, M.D., M.S. [facility] [address] [place], [place]  [address]  [phone]  10/13/2012 10:29 AM

## 2012-10-14 ENCOUNTER — Telehealth: Payer: Self-pay | Admitting: Cardiology

## 2012-10-14 LAB — TYPE AND SCREEN
ABO/RH(D): O POS
Donor AG Type: NEGATIVE

## 2012-10-14 NOTE — Telephone Encounter (Signed)
Pt called after hours complaining of SOB and having to sit up to breathe.  She stated she was like this in the hospital but she pushed to go home as soon as she could.  Now more short of breath.  [facility], they instructed her to call our office.   I contacted [facility] to arrange home oxygen sats to perhaps supply oxygen, but was told they did not set up over the weekend.  We will call them back Monday and arrange.  I re-contacted the pt. And told her if no better or worse to go to ER, otherwise we will arrange on Monday.

## 2012-10-15 ENCOUNTER — Emergency Department (HOSPITAL_COMMUNITY): Payer: Non-veteran care

## 2012-10-15 ENCOUNTER — Inpatient Hospital Stay (HOSPITAL_COMMUNITY): Payer: Non-veteran care

## 2012-10-15 ENCOUNTER — Encounter (HOSPITAL_COMMUNITY): Payer: Self-pay | Admitting: Emergency Medicine

## 2012-10-15 ENCOUNTER — Inpatient Hospital Stay (HOSPITAL_COMMUNITY)
Admission: EM | Admit: 2012-10-15 | Discharge: 2012-10-18 | DRG: 291 | Disposition: A | Discharge status: Home Health | Payer: Non-veteran care | Attending: Internal Medicine | Admitting: Internal Medicine

## 2012-10-15 DIAGNOSIS — I120 Hypertensive chronic kidney disease with stage 5 chronic kidney disease or end stage renal disease: Secondary | ICD-10-CM | POA: Diagnosis present

## 2012-10-15 DIAGNOSIS — Z79899 Other long term (current) drug therapy: Secondary | ICD-10-CM

## 2012-10-15 DIAGNOSIS — D631 Anemia in chronic kidney disease: Secondary | ICD-10-CM

## 2012-10-15 DIAGNOSIS — I5023 Acute on chronic systolic (congestive) heart failure: Principal | ICD-10-CM

## 2012-10-15 DIAGNOSIS — I519 Heart disease, unspecified: Secondary | ICD-10-CM | POA: Diagnosis present

## 2012-10-15 DIAGNOSIS — I5022 Chronic systolic (congestive) heart failure: Secondary | ICD-10-CM | POA: Diagnosis present

## 2012-10-15 DIAGNOSIS — I252 Old myocardial infarction: Secondary | ICD-10-CM

## 2012-10-15 DIAGNOSIS — E43 Unspecified severe protein-calorie malnutrition: Secondary | ICD-10-CM | POA: Diagnosis present

## 2012-10-15 DIAGNOSIS — K7460 Unspecified cirrhosis of liver: Secondary | ICD-10-CM | POA: Diagnosis present

## 2012-10-15 DIAGNOSIS — I214 Non-ST elevation (NSTEMI) myocardial infarction: Secondary | ICD-10-CM | POA: Diagnosis present

## 2012-10-15 DIAGNOSIS — I509 Heart failure, unspecified: Secondary | ICD-10-CM | POA: Diagnosis present

## 2012-10-15 DIAGNOSIS — I959 Hypotension, unspecified: Secondary | ICD-10-CM | POA: Diagnosis not present

## 2012-10-15 DIAGNOSIS — R188 Other ascites: Secondary | ICD-10-CM | POA: Diagnosis present

## 2012-10-15 DIAGNOSIS — R1012 Left upper quadrant pain: Secondary | ICD-10-CM

## 2012-10-15 DIAGNOSIS — Z992 Dependence on renal dialysis: Secondary | ICD-10-CM

## 2012-10-15 DIAGNOSIS — N186 End stage renal disease: Secondary | ICD-10-CM | POA: Diagnosis present

## 2012-10-15 DIAGNOSIS — E876 Hypokalemia: Secondary | ICD-10-CM | POA: Diagnosis not present

## 2012-10-15 DIAGNOSIS — Z681 Body mass index (BMI) 19 or less, adult: Secondary | ICD-10-CM

## 2012-10-15 DIAGNOSIS — Z9861 Coronary angioplasty status: Secondary | ICD-10-CM

## 2012-10-15 DIAGNOSIS — I2589 Other forms of chronic ischemic heart disease: Secondary | ICD-10-CM | POA: Diagnosis present

## 2012-10-15 DIAGNOSIS — Z87891 Personal history of nicotine dependence: Secondary | ICD-10-CM

## 2012-10-15 DIAGNOSIS — N189 Chronic kidney disease, unspecified: Secondary | ICD-10-CM

## 2012-10-15 DIAGNOSIS — I2510 Atherosclerotic heart disease of native coronary artery without angina pectoris: Secondary | ICD-10-CM | POA: Diagnosis present

## 2012-10-15 DIAGNOSIS — I5021 Acute systolic (congestive) heart failure: Secondary | ICD-10-CM | POA: Insufficient documentation

## 2012-10-15 DIAGNOSIS — K861 Other chronic pancreatitis: Secondary | ICD-10-CM | POA: Diagnosis present

## 2012-10-15 DIAGNOSIS — N2581 Secondary hyperparathyroidism of renal origin: Secondary | ICD-10-CM | POA: Diagnosis present

## 2012-10-15 DIAGNOSIS — J811 Chronic pulmonary edema: Secondary | ICD-10-CM

## 2012-10-15 DIAGNOSIS — I25810 Atherosclerosis of coronary artery bypass graft(s) without angina pectoris: Secondary | ICD-10-CM | POA: Diagnosis present

## 2012-10-15 DIAGNOSIS — Z95810 Presence of automatic (implantable) cardiac defibrillator: Secondary | ICD-10-CM

## 2012-10-15 DIAGNOSIS — E119 Type 2 diabetes mellitus without complications: Secondary | ICD-10-CM | POA: Diagnosis present

## 2012-10-15 DIAGNOSIS — Z66 Do not resuscitate: Secondary | ICD-10-CM | POA: Diagnosis present

## 2012-10-15 DIAGNOSIS — I255 Ischemic cardiomyopathy: Secondary | ICD-10-CM | POA: Diagnosis present

## 2012-10-15 DIAGNOSIS — I10 Essential (primary) hypertension: Secondary | ICD-10-CM | POA: Diagnosis present

## 2012-10-15 DIAGNOSIS — Z515 Encounter for palliative care: Secondary | ICD-10-CM

## 2012-10-15 DIAGNOSIS — E875 Hyperkalemia: Secondary | ICD-10-CM | POA: Diagnosis present

## 2012-10-15 DIAGNOSIS — Z7982 Long term (current) use of aspirin: Secondary | ICD-10-CM

## 2012-10-15 DIAGNOSIS — I447 Left bundle-branch block, unspecified: Secondary | ICD-10-CM | POA: Diagnosis present

## 2012-10-15 DIAGNOSIS — J9600 Acute respiratory failure, unspecified whether with hypoxia or hypercapnia: Secondary | ICD-10-CM | POA: Diagnosis present

## 2012-10-15 DIAGNOSIS — D638 Anemia in other chronic diseases classified elsewhere: Secondary | ICD-10-CM | POA: Diagnosis present

## 2012-10-15 DIAGNOSIS — N039 Chronic nephritic syndrome with unspecified morphologic changes: Secondary | ICD-10-CM

## 2012-10-15 LAB — COMPREHENSIVE METABOLIC PANEL
ALT: 16 U/L (ref 0–35)
Alkaline Phosphatase: 244 U/L — ABNORMAL HIGH (ref 39–117)
CO2: 26 mEq/L (ref 19–32)
Chloride: 97 mEq/L (ref 96–112)
GFR calc Af Amer: 30 mL/min — ABNORMAL LOW (ref >90)
GFR calc non Af Amer: 26 mL/min — ABNORMAL LOW (ref >90)
Glucose, Bld: 117 mg/dL — ABNORMAL HIGH (ref 70–99)
Potassium: 6 mEq/L — ABNORMAL HIGH (ref 3.5–5.1)
Sodium: 135 mEq/L (ref 135–145)
Total Protein: 7.5 g/dL (ref 6.0–8.3)

## 2012-10-15 LAB — CBC
MCV: 88.7 fL (ref 78.0–100.0)
Platelets: 166 10*3/uL (ref 150–400)
RBC: 3.73 MIL/uL — ABNORMAL LOW (ref 3.87–5.11)
WBC: 8.6 10*3/uL (ref 4.0–10.5)

## 2012-10-15 LAB — CREATININE, SERUM: GFR calc Af Amer: 28 mL/min — ABNORMAL LOW (ref >90)

## 2012-10-15 LAB — IRON AND TIBC
Iron: 45 ug/dL (ref 42–135)
Saturation Ratios: 28 % (ref 20–55)
TIBC: 160 ug/dL — ABNORMAL LOW (ref 250–470)
UIBC: 115 ug/dL — ABNORMAL LOW (ref 125–400)

## 2012-10-15 LAB — GLUCOSE, CAPILLARY
Glucose-Capillary: 112 mg/dL — ABNORMAL HIGH (ref 70–99)
Glucose-Capillary: 59 mg/dL — ABNORMAL LOW (ref 70–99)

## 2012-10-15 LAB — CK TOTAL AND CKMB (NOT AT ARMC): Total CK: 30 U/L (ref 7–177)

## 2012-10-15 LAB — CBC WITH DIFFERENTIAL/PLATELET
Hemoglobin: 11.5 g/dL — ABNORMAL LOW (ref 12.0–15.0)
Lymphocytes Relative: 12 % (ref 12–46)
Lymphs Abs: 1 10*3/uL (ref 0.7–4.0)
Monocytes Relative: 6 % (ref 3–12)
Neutro Abs: 6.7 10*3/uL (ref 1.7–7.7)
Neutrophils Relative %: 81 % — ABNORMAL HIGH (ref 43–77)
RBC: 3.93 MIL/uL (ref 3.87–5.11)

## 2012-10-15 LAB — TROPONIN I: Troponin I: 1.17 ng/mL (ref <0.30)

## 2012-10-15 MED ORDER — SODIUM CHLORIDE 0.9 % IJ SOLN
3.0000 mL | Freq: Two times a day (BID) | INTRAMUSCULAR | Status: DC
  Administered 2012-10-16 – 2012-10-18 (×5): 3 mL via INTRAVENOUS

## 2012-10-15 MED ORDER — INSULIN ASPART 100 UNIT/ML IV SOLN
10.0000 [IU] | Freq: Once | INTRAVENOUS | Status: DC
  Filled 2012-10-15 (×2): qty 0.1

## 2012-10-15 MED ORDER — NITROGLYCERIN 0.4 MG SL SUBL: 0.4000 mg | SUBLINGUAL_TABLET | SUBLINGUAL | Status: DC | PRN

## 2012-10-15 MED ORDER — ONDANSETRON HCL 4 MG/2ML IJ SOLN
4.0000 mg | Freq: Once | INTRAMUSCULAR | Status: AC
  Administered 2012-10-15: 4 mg via INTRAVENOUS
  Filled 2012-10-15: qty 2

## 2012-10-15 MED ORDER — POLYETHYLENE GLYCOL 3350 17 G PO PACK
17.0000 g | PACK | Freq: Every day | ORAL | Status: DC | PRN
  Filled 2012-10-15: qty 1

## 2012-10-15 MED ORDER — DEXTROSE 50 % IV SOLN
50.0000 mL | Freq: Once | INTRAVENOUS | Status: AC
  Administered 2012-10-15: 50 mL via INTRAVENOUS
  Filled 2012-10-15: qty 50

## 2012-10-15 MED ORDER — CINACALCET HCL 30 MG PO TABS
60.0000 mg | ORAL_TABLET | Freq: Every day | ORAL | Status: DC
  Administered 2012-10-16 – 2012-10-18 (×3): 60 mg via ORAL
  Filled 2012-10-15 (×5): qty 2

## 2012-10-15 MED ORDER — SODIUM BICARBONATE 8.4 % IV SOLN
50.0000 meq | Freq: Once | INTRAVENOUS | Status: AC
  Administered 2012-10-15: 50 meq via INTRAVENOUS
  Filled 2012-10-15: qty 50

## 2012-10-15 MED ORDER — SIMVASTATIN 20 MG PO TABS
20.0000 mg | ORAL_TABLET | Freq: Every day | ORAL | Status: DC
  Administered 2012-10-16 – 2012-10-17 (×3): 20 mg via ORAL
  Filled 2012-10-15 (×4): qty 1

## 2012-10-15 MED ORDER — INSULIN ASPART 100 UNIT/ML IV SOLN
10.0000 [IU] | Freq: Once | INTRAVENOUS | Status: AC
  Administered 2012-10-15: 10 [IU] via INTRAVENOUS
  Filled 2012-10-15: qty 0.1

## 2012-10-15 MED ORDER — RENA-VITE PO TABS
1.0000 | ORAL_TABLET | Freq: Every day | ORAL | Status: DC
  Administered 2012-10-16 – 2012-10-18 (×3): 1 via ORAL
  Filled 2012-10-15 (×3): qty 1

## 2012-10-15 MED ORDER — IOHEXOL 300 MG/ML  SOLN
25.0000 mL | INTRAMUSCULAR | Status: AC
  Administered 2012-10-15: 25 mL via ORAL

## 2012-10-15 MED ORDER — DEXTROSE 50 % IV SOLN
INTRAVENOUS | Status: AC
  Filled 2012-10-15: qty 50

## 2012-10-15 MED ORDER — ASPIRIN EC 81 MG PO TBEC
81.0000 mg | DELAYED_RELEASE_TABLET | Freq: Every day | ORAL | Status: DC
  Administered 2012-10-16 – 2012-10-18 (×3): 81 mg via ORAL
  Filled 2012-10-15 (×3): qty 1

## 2012-10-15 MED ORDER — SODIUM CHLORIDE 0.9 % IJ SOLN
3.0000 mL | Freq: Two times a day (BID) | INTRAMUSCULAR | Status: DC
  Administered 2012-10-15 – 2012-10-18 (×5): 3 mL via INTRAVENOUS

## 2012-10-15 MED ORDER — HYDROMORPHONE HCL 2 MG PO TABS: 2.0000 mg | ORAL_TABLET | Freq: Four times a day (QID) | ORAL | Status: DC | PRN

## 2012-10-15 MED ORDER — SODIUM CHLORIDE 0.9 % IJ SOLN: 3.0000 mL | INTRAMUSCULAR | Status: DC | PRN

## 2012-10-15 MED ORDER — HYDROMORPHONE HCL PF 1 MG/ML IJ SOLN
1.0000 mg | INTRAMUSCULAR | Status: DC | PRN
  Administered 2012-10-15 – 2012-10-17 (×5): 1 mg via INTRAVENOUS
  Filled 2012-10-15 (×5): qty 1

## 2012-10-15 MED ORDER — CARVEDILOL 3.125 MG PO TABS
4.6875 mg | ORAL_TABLET | Freq: Two times a day (BID) | ORAL | Status: DC
  Administered 2012-10-16: 4.6875 mg via ORAL
  Administered 2012-10-16: 3.125 mg via ORAL
  Administered 2012-10-17 – 2012-10-18 (×3): 4.6875 mg via ORAL
  Filled 2012-10-15 (×9): qty 1.5

## 2012-10-15 MED ORDER — ONDANSETRON HCL 4 MG/2ML IJ SOLN
4.0000 mg | Freq: Four times a day (QID) | INTRAMUSCULAR | Status: DC | PRN
  Administered 2012-10-15: 4 mg via INTRAVENOUS
  Filled 2012-10-15: qty 2

## 2012-10-15 MED ORDER — IOHEXOL 300 MG/ML  SOLN
80.0000 mL | Freq: Once | INTRAMUSCULAR | Status: AC | PRN
  Administered 2012-10-15: 80 mL via INTRAVENOUS

## 2012-10-15 MED ORDER — AMIODARONE HCL 200 MG PO TABS
200.0000 mg | ORAL_TABLET | Freq: Two times a day (BID) | ORAL | Status: DC
  Administered 2012-10-16 – 2012-10-18 (×6): 200 mg via ORAL
  Filled 2012-10-15 (×7): qty 1

## 2012-10-15 MED ORDER — HEPARIN SODIUM (PORCINE) 5000 UNIT/ML IJ SOLN
5000.0000 [IU] | Freq: Three times a day (TID) | INTRAMUSCULAR | Status: DC
  Administered 2012-10-16 – 2012-10-18 (×6): 5000 [IU] via SUBCUTANEOUS
  Filled 2012-10-15 (×11): qty 1

## 2012-10-15 MED ORDER — NEPRO/CARBSTEADY PO LIQD
237.0000 mL | Freq: Two times a day (BID) | ORAL | Status: DC
  Administered 2012-10-16 (×2): 237 mL via ORAL

## 2012-10-15 MED ORDER — SODIUM CHLORIDE 0.9 % IV SOLN: 250.0000 mL | INTRAVENOUS | Status: DC | PRN

## 2012-10-15 MED ORDER — METHADONE HCL 5 MG PO TABS
5.0000 mg | ORAL_TABLET | Freq: Four times a day (QID) | ORAL | Status: DC
  Administered 2012-10-15 – 2012-10-18 (×9): 5 mg via ORAL
  Filled 2012-10-15 (×10): qty 1

## 2012-10-15 MED ORDER — RANOLAZINE ER 500 MG PO TB12
500.0000 mg | ORAL_TABLET | Freq: Every day | ORAL | Status: DC
  Filled 2012-10-15: qty 1

## 2012-10-15 MED ORDER — MORPHINE SULFATE 2 MG/ML IJ SOLN: 2.0000 mg | INTRAMUSCULAR | Status: DC | PRN

## 2012-10-15 MED ORDER — SEVELAMER CARBONATE 800 MG PO TABS
800.0000 mg | ORAL_TABLET | Freq: Three times a day (TID) | ORAL | Status: DC
  Filled 2012-10-15 (×2): qty 1

## 2012-10-15 MED ORDER — DOCUSATE SODIUM 100 MG PO CAPS
100.0000 mg | ORAL_CAPSULE | Freq: Two times a day (BID) | ORAL | Status: DC
  Administered 2012-10-16 – 2012-10-18 (×6): 100 mg via ORAL
  Filled 2012-10-15 (×7): qty 1

## 2012-10-15 MED ORDER — ALBUTEROL SULFATE (5 MG/ML) 0.5% IN NEBU
2.5000 mg | INHALATION_SOLUTION | Freq: Once | RESPIRATORY_TRACT | Status: AC
  Administered 2012-10-15: 2.5 mg via RESPIRATORY_TRACT

## 2012-10-15 MED ORDER — PANTOPRAZOLE SODIUM 40 MG PO TBEC
40.0000 mg | DELAYED_RELEASE_TABLET | Freq: Every day | ORAL | Status: DC
  Administered 2012-10-16 – 2012-10-18 (×4): 40 mg via ORAL
  Filled 2012-10-15 (×3): qty 1

## 2012-10-15 MED ORDER — ALBUTEROL SULFATE (5 MG/ML) 0.5% IN NEBU
5.0000 mg | INHALATION_SOLUTION | Freq: Once | RESPIRATORY_TRACT | Status: DC
  Filled 2012-10-15: qty 1

## 2012-10-15 MED ORDER — IPRATROPIUM BROMIDE 0.02 % IN SOLN
0.5000 mg | Freq: Once | RESPIRATORY_TRACT | Status: DC
  Filled 2012-10-15: qty 2.5

## 2012-10-15 MED ORDER — ONDANSETRON HCL 4 MG PO TABS: 4.0000 mg | ORAL_TABLET | Freq: Four times a day (QID) | ORAL | Status: DC | PRN

## 2012-10-15 NOTE — Progress Notes (Signed)
Hemodialysis Tx complete. Unable to get goal of 2L due to frequent drops in BP. Has a frequent congested wet productive cough and has edema in her feet.

## 2012-10-15 NOTE — ED Notes (Signed)
Pt to ED with c/o SOB since yesterday. Pt states she is coughing up clear mucus. Pt on dialysis Monday, Wednesday, Friday. Face mast place on pt.

## 2012-10-15 NOTE — Progress Notes (Signed)
I have seen and examined this patient and agree with plan as outlined above.  Pt with ongoing volume overload and diffiicult with UF on HD due to hypotension.  Pt was last dialyzed on Thursday and didn't go to HD today in [facility] as she is being changed to MWF at [facility].  She did not go there yesterday for unclear reasons.  If we are unable to UF with HD it would be very reasonable to get palliative care involved. Manav Pierotti A,MD 10/15/2012 6:09 PM

## 2012-10-15 NOTE — Progress Notes (Signed)
10/15/12 1348 nsg To 6700 per stretcher accompanied by RN alert and oriented pt. Placed telemetry per order. Pt has no skin breakdown but due to wt loss c/o pain on her bottom area and placed foam dressing for comfort. Telephone and call light within reach.

## 2012-10-15 NOTE — Consult Note (Signed)
Reason for Consult: abnormal troponin, CHF acute   Referring Physician: ER MD   PCP: Dorothy Heinz, MD Primary Cardiologist:  [name] is an 62 y.o. female.    Chief Complaint: SOB    HPI: 15 YOAAF just discharged 10/13/12 after episode of Sustained VT with cardioversion in ER and NSTEMI with cardiac cath revealing progression of CAD but without true culprit lesion.  Pt does have BiVICD- [employer] device with recent reprogramming to 3 zone device (VF at 200 bpm, VT at 170 bpm, monitor zone at 150).  Her medications were adjusted and she was discharged 10/13/12 at her insistence.  Her dialysis was changed from T/Th/Sat to M/W/Fr. With her last dialysis on Thursday.  Pt called yesterday with SOB and asking for oxygen.  She was instructed to return to ER for continued SOB.   She has had to sit up to breathe at home.  DOE any exertion.  Currently troponin of 1.22 most likely continued decrease in her pk troponin of 15 on recent admit but will follow.  K+ of 6.0.  Pro BNP >70,000  EKG SR without acute changes and chronic LBBB. Still with SOB but also complains of "pancreatic pain".  Also complains of sacral pain.  Other cardiac history: 2008 underwent CABG surgery x 4. She also has had recurrent pancreatitis with pancreatic pseudocyst, history of esophageal varices at [facility], cirrhosis, as well as CHF. She has end-stage renal disease, on dialysis.  Apparently in 2008, she was found have a stenosis in the distal aspect of her LIMA graft and apparently underwent stenting of her native LAD. It was felt that her graft to her diagonal was occluded at that time. Apparently, the patient is also status post ICD at the [facility]. She was admitted to [facility] in November of 2013 for a NSTEMI and underwent a LHC by Dr. Tresa Endo, which revealed the SVG-RCA and SVG to DX to be patent. The SVG-DX was moderately atertic and was very small. The LIMA - LAD was patent with 60% LAD disease post  anastomosis. The LAD stent was patent. The RCA was 80% proximal and 60 and 90% ostial RV branches. Her EF was 20-25%. Cath 10/11/12 revealed POST-OPERATIVE DIAGNOSIS:  Patent LIMA-LAD with ~60% (stable) ~ anastomotic lesion; brisk to-fro flow on native angio; LAD is small caliber distally & wraps the apex  SVG-Diag - now essentially fully atretic & non-functional  SVG-rPDA & SVG OM widely patent. Retrograde flow from rPDA fills RPL; retrograde flow from OM1 graft fills branching OM2  Native RCA has severe proximal stenosis & fills the rPL  LVP 81/8 mmHg, EDP 18 mmHg; AoP 82/49 mmHg (62 mmHg);      Past Medical History  Diagnosis Date  . Coronary artery disease     s/p CABG 2008 with multiple PCIs  . Ischemic cardiomyopathy     Cath 04/2012, significant calcifications  . History of nonadherence to medical treatment   . Systolic CHF     16/1096 echo EF 10%  . Esophageal varices   . Cirrhosis     hx of fatty liver per patient, has known ascites with repeated paracenteses at [facility] as of 2013, also +hx of esoph varices with banding in the past  . Pacemaker   . ICD (implantable cardiac defibrillator) in place   . Hypertension     "used to have HTN; now I'm low" (05/23/2012)  . NSTEMI (non-ST elevated myocardial infarction)  04/2012; Trop peaked to 1.39; 05/23/2012 pt denies ever having MI  . Pneumonia 02/2012    "first time ever" (05/23/2012)  . Chronic bronchitis 1970's thru 2002    "went away when I stopped smoking in 2002" (05/23/2012)  . Diabetes mellitus     Diet controlled  . History of blood transfusion     "alot of them" (05/23/2012)  . Iron (Fe) deficiency anemia     "severe" (05/23/2012)  . Pancreatitis, chronic 10/04/2011    Had severe gallstone pancreatitis in May 2003, no surgery, treated medically then returned in June 2003 for cholecystectomy and cyst gastrostomy for pancreatic pseudocyst. In March 2013 was admitted for hemorrhage into psueodcyst. In April 2013 was treated  at [facility] for polymicrobial bacteremia (fungal, MRSA and enterobacter) felt to be due to ruptured pseudocyst, treated medically. Admitted Jan 2014 with abd pain and had gas and fluid in pancreatic bed. She refused surgery ("would not make it") and had fluid aspirated by IR- gram stain showed yeast and cultures grew enterococcus species, sensitive to amp and vanc. She was seen by ID and discharged on IV vanc/fortaz with HD and po voriconazole.     [name] ESRD on hemodialysis 05/06/2012    T,Th,Sat HD by [facility] on [address]. Started hemodialysis around July 2013.    [name] CHF (congestive heart failure)   . Complication of vascular access for dialysis 07/13/2012    L arm AVG ([facility]) was ligated 1/6 after significant L arm swelling secondary to L SCV stenosis and pacemaker; new access to be placed at right arm on 1/29 by Dr. Hollace Hayward    . NSTEMI (non-ST elevated myocardial infarction) 10/08/12    cardiac cath with no obvious acute lesion, medical therapy    Past Surgical History  Procedure Laterality Date  . Esophagogastroduodenoscopy  09/15/2011    Procedure: ESOPHAGOGASTRODUODENOSCOPY (EGD);  Surgeon: Theda Belfast, MD;  Location: [facility] ENDOSCOPY;  Service: Endoscopy;  Laterality: N/A;  . Cholecystectomy  2003  . Tonsillectomy and adenoidectomy  1961  . Appendectomy  1973?  [name] Vaginal hysterectomy  1973?  [name] Tubal ligation  1972  . Dilation and curettage of uterus      "bunch from profuse bleeding in the 1970's" (05/23/2012)  . Coronary artery bypass graft  2008    CABG X4  . Coronary angioplasty with stent placement  2008    "1; day after CABG" (05/23/2012)  . Cardiac catheterization      "before 2008 and 3 wk ago" (05/23/2012)  . Insert / replace / remove pacemaker  06/2011    pacemaker ICD  . Cardiac defibrillator placement  06/2011  . Arteriovenous graft placement  03/2012    right antecub  . Insertion of dialysis catheter  ~ 10/2011    right chest  .  Peritoneal catheter insertion  08/2011  . Peritoneal catheter removal  ~ 10/2011    Family History  Problem Relation Age of Onset  . Coronary artery disease Mother   . Hypertension Mother   . Diabetes Mother   . Diabetes Sister   . Anesthesia problems Neg Hx    Social History:  reports that she quit smoking about 12 years ago. Her smoking use included Cigarettes. She has a 10.23 pack-year smoking history. She has never used smokeless tobacco. She reports that she does not drink alcohol or use illicit drugs.  Allergies:  Allergies  Allergen Reactions  . Codeine Itching  . Morphine And  Related Shortness Of Breath    SOB when given IV once "to me it was mild; I called out fast for help; never had to intubate" (05/23/2012)  . Ace Inhibitors Other (See Comments)    Unknown reaction but her physician stated that she can't take it (specifically lisinopril)  . Tylenol (Acetaminophen) Other (See Comments)    Patient states that the doctor told her she has a Fatty Liver.    OUTPATIENT MEDICATIONS: Amiodarone 200 mg BID ASA 81 mg daily Coreg 3.125  Take 1.5 tabs BID  = 4.6875 BID Sensipar 30 mg 2 tab daily with BK DSS 100 mg 2 daily Nepro Carb steady liquid BID Dilaudid 2 mg every 6 hours prn Methadone 5 mg QID Multivitamin daily NTG prn protonix 40 mg daily pravachol 20 mg at hs. Ranexa 500 mg daily Renagel 400 mg TID with meals      Results for orders placed during the hospital encounter of 10/15/12 (from the past 48 hour(s))  PRO B NATRIURETIC PEPTIDE     Status: Abnormal   Collection Time    10/15/12  9:26 AM      Result Value Range   Pro B Natriuretic peptide (BNP) >70000.0 (*) 0 - 125 pg/mL  TROPONIN I     Status: Abnormal   Collection Time    10/15/12  9:26 AM      Result Value Range   Troponin I 1.22 (*) <0.30 ng/mL   Comment:            Due to the release kinetics of cTnI,     a negative result within the first hours     of the onset of symptoms does not rule  out     myocardial infarction with certainty.     If myocardial infarction is still suspected,     repeat the test at appropriate intervals.     CRITICAL RESULT CALLED TO, READ BACK BY AND VERIFIED WITH:     RYUAL K,RN 1108 10/15/12 SCALES H  CBC WITH DIFFERENTIAL     Status: Abnormal   Collection Time    10/15/12  9:50 AM      Result Value Range   WBC 8.2  4.0 - 10.5 K/uL   RBC 3.93  3.87 - 5.11 MIL/uL   Hemoglobin 11.5 (*) 12.0 - 15.0 g/dL   HCT 16.1 (*) 09.6 - 04.5 %   MCV 88.8  78.0 - 100.0 fL   MCH 29.3  26.0 - 34.0 pg   MCHC 33.0  30.0 - 36.0 g/dL   RDW 40.9 (*) 81.1 - 91.4 %   Platelets 183  150 - 400 K/uL   Neutrophils Relative 81 (*) 43 - 77 %   Neutro Abs 6.7  1.7 - 7.7 K/uL   Lymphocytes Relative 12  12 - 46 %   Lymphs Abs 1.0  0.7 - 4.0 K/uL   Monocytes Relative 6  3 - 12 %   Monocytes Absolute 0.5  0.1 - 1.0 K/uL   Eosinophils Relative 0  0 - 5 %   Eosinophils Absolute 0.0  0.0 - 0.7 K/uL   Basophils Relative 1  0 - 1 %   Basophils Absolute 0.0  0.0 - 0.1 K/uL  COMPREHENSIVE METABOLIC PANEL     Status: Abnormal   Collection Time    10/15/12  9:50 AM      Result Value Range   Sodium 135  135 - 145 mEq/L   Potassium 6.0 (*) 3.5 -  5.1 mEq/L   Chloride 97  96 - 112 mEq/L   CO2 26  19 - 32 mEq/L   Glucose, Bld 117 (*) 70 - 99 mg/dL   BUN 36 (*) 6 - 23 mg/dL   Creatinine, Ser 4.78 (*) 0.50 - 1.10 mg/dL   Calcium 29.5  8.4 - 62.1 mg/dL   Total Protein 7.5  6.0 - 8.3 g/dL   Albumin 2.5 (*) 3.5 - 5.2 g/dL   AST 34  0 - 37 U/L   ALT 16  0 - 35 U/L   Alkaline Phosphatase 244 (*) 39 - 117 U/L   Total Bilirubin 0.7  0.3 - 1.2 mg/dL   GFR calc non Af Amer 26 (*) >90 mL/min   GFR calc Af Amer 30 (*) >90 mL/min   Comment:            The eGFR has been calculated     using the CKD EPI equation.     This calculation has not been     validated in all clinical     situations.     eGFR's persistently     <90 mL/min signify     possible Chronic Kidney Disease.   Dg  Chest Port 1 View  10/15/2012  *RADIOLOGY REPORT*  Clinical Data: Shortness of breath.  History of dialysis and CHF.  PORTABLE CHEST - 1 VIEW  Comparison: 10/08/2012  Findings: Right jugular dialysis catheter tip is in the right atrium.  There is a left cardiac ICD.  Heart remains enlarged for size.  Prominent interstitial densities suggest mild edema or vascular congestion.  Limited evaluation of the right costophrenic angle and cannot exclude a small right pleural effusion.  IMPRESSION: Cardiomegaly with mild interstitial edema or vascular congestion.   Original Report Authenticated By: Richarda Overlie, M.D.     ROS: General:no colds or fevers, no weight changes Skin:no rashes or ulcers HEENT:no blurred vision, no congestion CV:see HPI PUL:see HPI GI:no diarrhea constipation or melena, no indigestion, abd pain her pancreas pain  GU:no hematuria, no dysuria- dialysis pt MS:no joint pain, no claudication, + sacral pain, "just bone, no padding" Neuro:no syncope, no lightheadedness Endo:no diabetes, no thyroid disease   Blood pressure 115/67, pulse 82, temperature 97.8 F (36.6 C), temperature source Oral, resp. rate 18, SpO2 96.00%. PE: General: alert and oriented, pleasant affect, NAD at rest Skin:warm and dry, brisk capillary refill HEENT:normocephalic, sclera clear  Neck:supple with JVD to the jaw line, no carotid bruits Heart:S1S2 RRR no gallup no rub, soft systolic murmur Lungs:+ rales throughout, no wheezes Abd:+ BS, soft, non tender Sacrum without ulcers, + hemorrhoids  Ext:2+ pitting edema to the knees Neuro:alert and oriented. MAE, follows commands, + facial symmetry     Assessment/Plan Principal Problem:   Acute on chronic systolic heart failure Active Problems:   Ischemic cardiomyopathy, EF 20-25% echo April 2013, now 10% by cath (05/06/12)   Chronic systolic congestive heart failure, NYHA class 3   LBBB (left bundle branch block)   Hypertension   Pancreatitis, chronic   ESRD  on hemodialysis   CAD, CABG X 4 Feb '08, LAD stent June '08   ICD (implantable cardiac defibrillator) in place, MDT placed Jan 2013 [facility]   Hx of non-ST elevation myocardial infarction (NSTEMI), 10/10/12   Hyperkalemic syndrome  PLAN: For dialysis, continue serial cardiac enzymes, monitor with hx recent sustained V tach. Continue amiodarone.  Check LFTs.   Pt is very ill with ICM, cirrhosis, liver failure, renal failure.  Will hold ranexa, contraindicated with cirrhosis.   Leone Brand  Nurse Practitioner Certified [facility] Pager [phone] 10/15/2012, 1:23 PM

## 2012-10-15 NOTE — Progress Notes (Signed)
[facility] Progress Note  Subjective:  Discharged on 5/1 s/p episode of sustained VT requiring emergency cardioversion. Transfer to [facility] for MWF HD in progress. Readmitted for SOB. Abdominal pain and nausea are ongoing due to her pancreatitis. No other complaints.  Objective Filed Vitals:   10/15/12 1145 10/15/12 1200 10/15/12 1215 10/15/12 1348  BP: 115/80 104/65 115/67 122/82  Pulse: 79 77 82 76  Temp:      TempSrc:      Resp: 19 21 18 18   SpO2: 99% 98% 96% 100%   Physical Exam General: Cachectic, Heart: RRR, ICD left chest Lungs: +Non productive cough. Scattered wheezes. Diminished BS at bases. Some work of breathing Abdomen: Distended, +tenderness to RLQ, normal BS Extremities: 3+ LE pitting edema Dialysis Access: Rt I-J (no plans for permanent access)  Outpatient HD: MWF @ [facility] - Was to start there 5/5. (previously at [facility]) Optiflux 160. Time: 4:00. 4K/2.5Ca bath. EDW ~45.5 kg (needs titration due to freq hospitalizations) Rt I-J BFR 400/DFR 800 Tight Heparin. Epogen 28,000 u, No IV fe or Hectorol (needs updated labs)  Assessment/Plan: 1. Pulmonary edema - per admit, secondary to Acute systolic congestive heart failure/Chronic systolic congestive heart failure, NYHA class 3/ Ischemic cardiomyopathy, EF now 10% by cath 05/06/12 -  Pulmonary edema on CXR with 3+ pitting LE edema. Fluid removal challenging last admission due to BP and cardiomyopathy-  On coreg. Cough remains. Troponins decreasing so no concern for ACS at this time.  2. ESRD/ Hyperkalemia -  K+ 6.0. Emergent HD today.  Will continue to monitor K+ at discharge and adjust bath in op orders if needed. 3. Pancreatitis - Pain management, Ct abdomen pending. 4. HTN/Volume - SBPs stable in 110-120s - will consider midodrine if BP limits UF today Last inpatient post wgt was 45.5 kg - will try for this today. Pt is willing to consider 4 x week HD - Not sure if she can tolerate a 2 day break/weekend.   5. SVT/ NSTEMI - s/p cardioversion on 5/1,  Cardiac cath and ICD reprogrammed. Troponin positive but decreasing, EF of 10%; on amiodarone, low dose coreg bid.  6. Anemia - Hgb 11.5 > 7.9 s/p max Aranesp and transfusion last admission. Will update iron studies and recheck CBC. Resume Aranesp if hgb declines. 7. Secondary hyperparathyroidism- Ca 10.4 (11.8 corrected) P 2.3 - Renvela d/c'd last admission. Continue sensipar. No op hectorol.   8. Nutrition - Alb 2.5; NPO for now. Will allow heart healthy regular diet with fluid restriction + Nepro when appropriate 9. Dispo - Pt aware of poor prognosis. She wants limited code (amenable to cardioversion and antiarrythmics.) Has agreed to explore Hospice but does not want full comfort care at this time.  Scot Jun. Thad Ranger [facility] Pager [phone] 10/15/2012,1:57 PM  LOS: 0 days    Additional Objective Labs: Basic Metabolic Panel:  Recent Labs Lab 10/10/12 0703  10/12/12 0633 10/13/12 0648 10/15/12 0950  NA 133*  < > 129* 134* 135  K 4.0  < > 3.6 3.9 6.0*  CL 95*  < > 93* 98 97  CO2 31  < > 27 29 26   GLUCOSE 85  < > 92 134* 117*  BUN 36*  < > 34* 23 36*  CREATININE 2.50*  < > 2.35* 1.56* 1.96*  CALCIUM 9.9  < > 8.9 9.0 10.4  PHOS 4.1  --  2.9 2.3  --   < > = values in this interval not displayed. Liver  Function Tests:  Recent Labs Lab 10/12/12 [phone] 10/13/12 0648 10/15/12 0950  AST  --   --  34  ALT  --   --  16  ALKPHOS  --   --  244*  BILITOT  --   --  0.7  PROT  --   --  7.5  ALBUMIN 2.4* 2.3* 2.5*   CBC:  Recent Labs Lab 10/08/12 1935  10/10/12 0704 10/11/12 0555 10/12/12 0635 10/13/12 0600 10/15/12 0950  WBC 13.8*  < > 8.6 8.0 9.2 5.7 8.2  NEUTROABS 12.6*  --   --   --   --   --  6.7  HGB 8.8*  < > 8.5* 8.7* 7.9* 8.0* 11.5*  HCT 25.8*  < > 25.6* 26.2* 23.8* 24.3* 34.9*  MCV 87.2  < > 89.2 90.0 89.8 90.0 88.8  PLT 120*  < > 116* 123* 125* 105* 183  < > = values in this interval not  displayed. Blood Culture    Component Value Date/Time   SDES BLOOD LEFT HAND 08/20/2012 2200   SPECREQUEST BOTTLES DRAWN AEROBIC AND ANAEROBIC 10CC EACH 08/20/2012 2200   CULT NO GROWTH 5 DAYS 08/20/2012 2200   REPTSTATUS 08/29/2012 FINAL 08/20/2012 2200    Cardiac Enzymes:  Recent Labs Lab 10/08/12 1935 10/09/12 0955 10/09/12 1503 10/09/12 2105 10/15/12 0926  TROPONINI 5.67* 14.39* 12.42* 8.70* 1.22*   CBG:  Recent Labs Lab 10/11/12 1645 10/11/12 2125 10/12/12 1145 10/12/12 1621 10/12/12 2117  GLUCAP 112* 161* 73 175* 135*   Studies/Results: Dg Chest Port 1 View  10/15/2012  *RADIOLOGY REPORT*  Clinical Data: Shortness of breath.  History of dialysis and CHF.  PORTABLE CHEST - 1 VIEW  Comparison: 10/08/2012  Findings: Right jugular dialysis catheter tip is in the right atrium.  There is a left cardiac ICD.  Heart remains enlarged for size.  Prominent interstitial densities suggest mild edema or vascular congestion.  Limited evaluation of the right costophrenic angle and cannot exclude a small right pleural effusion.  IMPRESSION: Cardiomegaly with mild interstitial edema or vascular congestion.   Original Report Authenticated By: Richarda Overlie, M.D.    Medications:

## 2012-10-15 NOTE — H&P (Signed)
[facility] History and Physical  [name] NUU:[id number] DOB: May 09, 1951 DOA: 10/15/2012  Referring physician: Dr. Ranae Palms PCP: Jyl Heinz, MD  Specialists: Dr. Lacy Duverney  Chief Complaint: acute respiratory failure  HPI: [name] is a 62 y.o. female  Past medical history of end-stage renal disease with dialysis Monday Wednesdays and Fridays, and stenting on 10/13/2012 with sustained ventricular tachycardia status post ICD 2013, due 2 ischemic cardiomyopathy with an EF by cath of 10% with a class III congestive heart failure, with an old left bundle branch block, esophageal varices with no documented episodes of bleeding, and chronic pancreatitis that comes in for shortness of breath this started after dialysis. She relates it has progressively gotten worse to the floor she couldn't even sleep yesterday due to the shortness of breath and coughs she came to the emergency room. She relates no fevers some mild abdominal pain nothing makes it better or worse. No diarrhea no blood in her stools. With anorexia.  In the ED: Chest x-ray was done that showed pulmonary edema, basic metabolic panel that shows a potassium of 6.0 an EKG with a left bundle branch block, first of cardiac enzymes 1.2 (3 days ago it was 8.0) with a BNP greater than 70,000.  Review of Systems: The patient denies anorexia, fever, weight loss,, vision loss, decreased hearing, hoarseness, chest pain, syncope, peripheral edema, balance deficits, hemoptysis, abdominal pain, melena, hematochezia, severe indigestion/heartburn, hematuria, incontinence, genital sores, muscle weakness, suspicious skin lesions, transient blindness, difficulty walking, depression, unusual weight change, abnormal bleeding, enlarged lymph nodes, angioedema, and breast masses.    Past Medical History  Diagnosis Date  . Coronary artery disease     s/p CABG 2008 with multiple PCIs  . Ischemic cardiomyopathy     Cath 04/2012, significant  calcifications  . History of nonadherence to medical treatment   . Systolic CHF     34/7425 echo EF 10%  . Esophageal varices   . Cirrhosis     hx of fatty liver per patient, has known ascites with repeated paracenteses at [facility] as of 2013, also +hx of esoph varices with banding in the past  . Pacemaker   . ICD (implantable cardiac defibrillator) in place   . Hypertension     "used to have HTN; now I'm low" (05/23/2012)  . NSTEMI (non-ST elevated myocardial infarction)     04/2012; Trop peaked to 1.39; 05/23/2012 pt denies ever having MI  . Pneumonia 02/2012    "first time ever" (05/23/2012)  . Chronic bronchitis 1970's thru 2002    "went away when I stopped smoking in 2002" (05/23/2012)  . Diabetes mellitus     Diet controlled  . History of blood transfusion     "alot of them" (05/23/2012)  . Iron (Fe) deficiency anemia     "severe" (05/23/2012)  . Pancreatitis, chronic 10/04/2011    Had severe gallstone pancreatitis in May 2003, no surgery, treated medically then returned in June 2003 for cholecystectomy and cyst gastrostomy for pancreatic pseudocyst. In March 2013 was admitted for hemorrhage into psueodcyst. In April 2013 was treated at [facility] for polymicrobial bacteremia (fungal, MRSA and enterobacter) felt to be due to ruptured pseudocyst, treated medically. Admitted Jan 2014 with abd pain and had gas and fluid in pancreatic bed. She refused surgery ("would not make it") and had fluid aspirated by IR- gram stain showed yeast and cultures grew enterococcus species, sensitive to amp and vanc. She was seen by ID and discharged on IV vanc/fortaz with  HD and po voriconazole.     [name] ESRD on hemodialysis 05/06/2012    T,Th,Sat HD by [facility] on [address]. Started hemodialysis around July 2013.    [name] CHF (congestive heart failure)   . Complication of vascular access for dialysis 07/13/2012    L arm AVG (placed by Dr. Lyda Perone @ [facility]25) was ligated 1/6 after significant L arm  swelling secondary to L SCV stenosis and pacemaker; new access to be placed at right arm on 1/29 by Dr. Hollace Hayward    . NSTEMI (non-ST elevated myocardial infarction) 10/08/12    cardiac cath with no obvious acute lesion, medical therapy   Past Surgical History  Procedure Laterality Date  . Esophagogastroduodenoscopy  09/15/2011    Procedure: ESOPHAGOGASTRODUODENOSCOPY (EGD);  Surgeon: Theda Belfast, MD;  Location: [facility] ENDOSCOPY;  Service: Endoscopy;  Laterality: N/A;  . Cholecystectomy  2003  . Tonsillectomy and adenoidectomy  1961  . Appendectomy  1973?  [name] Vaginal hysterectomy  1973?  [name] Tubal ligation  1972  . Dilation and curettage of uterus      "bunch from profuse bleeding in the 1970's" (05/23/2012)  . Coronary artery bypass graft  2008    CABG X4  . Coronary angioplasty with stent placement  2008    "1; day after CABG" (05/23/2012)  . Cardiac catheterization      "before 2008 and 3 wk ago" (05/23/2012)  . Insert / replace / remove pacemaker  06/2011    pacemaker ICD  . Cardiac defibrillator placement  06/2011  . Arteriovenous graft placement  03/2012    right antecub  . Insertion of dialysis catheter  ~ 10/2011    right chest  . Peritoneal catheter insertion  08/2011  . Peritoneal catheter removal  ~ 10/2011   Social History:  reports that she quit smoking about 12 years ago. Her smoking use included Cigarettes. She has a 10.23 pack-year smoking history. She has never used smokeless tobacco. She reports that she does not drink alcohol or use illicit drugs. Lives at home by herself  Allergies  Allergen Reactions  . Codeine Itching  . Morphine And Related Shortness Of Breath    SOB when given IV once "to me it was mild; I called out fast for help; never had to intubate" (05/23/2012)  . Ace Inhibitors Other (See Comments)    Unknown reaction but her physician stated that she can't take it (specifically lisinopril)  . Tylenol (Acetaminophen) Other (See Comments)    Patient states that  the doctor told her she has a Fatty Liver.    Family History  Problem Relation Age of Onset  . Coronary artery disease Mother   . Hypertension Mother   . Diabetes Mother   . Diabetes Sister   . Anesthesia problems Neg Hx     Prior to Admission medications   Medication Sig Start Date End Date Taking? Authorizing Provider  amiodarone (PACERONE) 200 MG tablet Take 1 tablet (200 mg total) by mouth 2 (two) times daily. 10/13/12  Yes Brittainy Sharol Harness, PA-C  aspirin EC 81 MG EC tablet Take 1 tablet (81 mg total) by mouth daily. 05/11/12  Yes Larey Seat, MD  carvedilol (COREG) 3.125 MG tablet Take 1.5 tablets (4.6875 mg total) by mouth 2 (two) times daily with a meal. 10/13/12  Yes Brittainy Sharol Harness, PA-C  cinacalcet (SENSIPAR) 30 MG tablet Take 2 tablets (60 mg total) by mouth daily with breakfast. 09/03/12  Yes Elease Etienne, MD  docusate  sodium 100 MG CAPS Take 100 mg by mouth 2 (two) times daily. 09/03/12  Yes Elease Etienne, MD  HYDROmorphone (DILAUDID) 2 MG tablet Take 1 tablet (2 mg total) by mouth every 6 (six) hours as needed (severe pain.). For pain 09/03/12  Yes Elease Etienne, MD  methadone (DOLOPHINE) 5 MG tablet Take 1 tablet (5 mg total) by mouth 4 (four) times daily. 09/03/12  Yes Elease Etienne, MD  multivitamin (RENA-VIT) TABS tablet Take 1 tablet by mouth daily. 10/14/11  Yes Clanford Cyndie Mull, MD  nitroGLYCERIN (NITROSTAT) 0.4 MG SL tablet Place 1 tablet (0.4 mg total) under the tongue every 5 (five) minutes as needed for chest pain. 10/13/12  Yes Brittainy Simmons, PA-C  Nutritional Supplements (FEEDING SUPPLEMENT, NEPRO CARB STEADY,) LIQD Take 237 mLs by mouth 2 (two) times daily with breakfast and lunch. 03/04/12  Yes Srikar Cherlynn Kaiser, MD  pantoprazole (PROTONIX) 40 MG tablet Take 40 mg by mouth daily.   Yes Historical Provider, MD  pravastatin (PRAVACHOL) 20 MG tablet Take 20 mg by mouth at bedtime.    Yes Historical Provider, MD  ranolazine (RANEXA) 500 MG 12 hr tablet Take  500 mg by mouth daily.    Yes Historical Provider, MD  sevelamer (RENAGEL) 400 MG tablet Take 1 tablet (400 mg total) by mouth 3 (three) times daily with meals. With a meal 09/03/12  Yes Elease Etienne, MD   Physical Exam: Filed Vitals:   10/15/12 1130 10/15/12 1145 10/15/12 1200 10/15/12 1215  BP: 118/74 115/80 104/65 115/67  Pulse: 79 79 77 82  Temp:      TempSrc:      Resp: 20 19 21 18   SpO2: 96% 99% 98% 96%    BP 115/67  Pulse 82  Temp(Src) 97.8 F (36.6 C) (Oral)  Resp 18  SpO2 96%  General Appearance:    Alert, cooperative, no distress, appears stated age, cachectic appearing   Head:    Normocephalic, without obvious abnormality, atraumatic           Throat:   Lips, mucosa, and tongue normal; hypertrophy of her parotids, within massive JVD   Neck:   Supple, symmetrical, trachea midline, no adenopathy;       thyroid:  No enlargement/tenderness/nodules; no carotid   bruit or JVD  Back:     Symmetric, no curvature, ROM normal, no CVA tenderness  Lungs:     Clear to auscultation bilaterally, respirations unlabored  Chest wall:    No tenderness or deformity  Heart:    Regular rate and rhythm, S1 and S2 normal, no murmur, rub   or gallop  Abdomen:     Soft, with left lower quadrant tenderness no rebound or guarding, no organomegaly and some mild epigastric pain to deep palpation         Extremities:   3+ edema with dry scaly skin   Pulses:   2+ and symmetric all extremities  Skin:   Skin color, texture, turgor normal, no rashes or lesions  Lymph nodes:   Cervical, supraclavicular, and axillary nodes normal  Neurologic:   CNII-XII intact. Normal strength, sensation and reflexes      throughout    Labs on Admission:  Basic Metabolic Panel:  Recent Labs Lab 10/08/12 1922  10/10/12 0703 10/11/12 0530 10/12/12 1610 10/13/12 0648 10/15/12 0950  NA  --   < > 133* 133* 129* 134* 135  K  --   < > 4.0 3.6 3.6 3.9 6.0*  CL  --   < > 95* 96 93* 98 97  CO2  --   < > 31  28 27 29 26   GLUCOSE  --   < > 85 99 92 134* 117*  BUN  --   < > 36* 20 34* 23 36*  CREATININE  --   < > 2.50* 1.63* 2.35* 1.56* 1.96*  CALCIUM  --   < > 9.9 9.1 8.9 9.0 10.4  MG 2.2  --   --   --   --   --   --   PHOS  --   --  4.1  --  2.9 2.3  --   < > = values in this interval not displayed. Liver Function Tests:  Recent Labs Lab 10/10/12 0703 10/12/12 1610 10/13/12 0648 10/15/12 0950  AST  --   --   --  34  ALT  --   --   --  16  ALKPHOS  --   --   --  244*  BILITOT  --   --   --  0.7  PROT  --   --   --  7.5  ALBUMIN 2.4* 2.4* 2.3* 2.5*   No results found for this basename: LIPASE, AMYLASE,  in the last 168 hours No results found for this basename: AMMONIA,  in the last 168 hours CBC:  Recent Labs Lab 10/08/12 1935  10/10/12 0704 10/11/12 0555 10/12/12 0635 10/13/12 0600 10/15/12 0950  WBC 13.8*  < > 8.6 8.0 9.2 5.7 8.2  NEUTROABS 12.6*  --   --   --   --   --  6.7  HGB 8.8*  < > 8.5* 8.7* 7.9* 8.0* 11.5*  HCT 25.8*  < > 25.6* 26.2* 23.8* 24.3* 34.9*  MCV 87.2  < > 89.2 90.0 89.8 90.0 88.8  PLT 120*  < > 116* 123* 125* 105* 183  < > = values in this interval not displayed. Cardiac Enzymes:  Recent Labs Lab 10/08/12 1935 10/09/12 0955 10/09/12 1503 10/09/12 2105 10/15/12 0926  TROPONINI 5.67* 14.39* 12.42* 8.70* 1.22*    BNP (last 3 results)  Recent Labs  10/09/12 0330 10/09/12 0955 10/15/12 0926  PROBNP >70000.0* >70000.0* >70000.0*   CBG:  Recent Labs Lab 10/11/12 1645 10/11/12 2125 10/12/12 1145 10/12/12 1621 10/12/12 2117  GLUCAP 112* 161* 73 175* 135*    Radiological Exams on Admission: Dg Chest Port 1 View  10/15/2012  *RADIOLOGY REPORT*  Clinical Data: Shortness of breath.  History of dialysis and CHF.  PORTABLE CHEST - 1 VIEW  Comparison: 10/08/2012  Findings: Right jugular dialysis catheter tip is in the right atrium.  There is a left cardiac ICD.  Heart remains enlarged for size.  Prominent interstitial densities suggest mild  edema or vascular congestion.  Limited evaluation of the right costophrenic angle and cannot exclude a small right pleural effusion.  IMPRESSION: Cardiomegaly with mild interstitial edema or vascular congestion.   Original Report Authenticated By: Richarda Overlie, M.D.     EKG: Independently reviewed. pending  Assessment/Plan Pulmonary edema secondary to Acute systolic congestive heart failure/Chronic systolic congestive heart failure, NYHA class 3/  Ischemic cardiomyopathy, EF now 10% by cath (05/06/12) - I have  consulted renal for possible dialysis, unlikely an acute coronary syndrome as her troponins continued to decrease, I will go ahead and check a CK-MB. - I will go ahead and continue her amiodarone, Coreg and consult her cardiology. Continue aspirin. - Have discussed extensively with  her the need to get hospice involved she has agreed. At this time she does not want to be full comfort care. She will like to read me with hospice and palliative care set up a plan.  ESRD on hemodialysis/Hyperkalemic syndrome: - She was given calcium by the ED physician, have consulted renal for emergent dialysis.  Abdominal pain: - Go ahead and get a CT scan of the abdomen and pelvis with oral contrast. Unclear etiology of her abdominal pain, she has remained afebrile, relates no diarrhea, she is exquisitely tender in her left lower quadrant no rebound or guarding. - Continue her Dilaudid and methadone.  Hypertension: - Blood pressure well controlled, continue current medications.  Pancreatitis, chronic: - Continue current home dose of narcotics.  ICD (implantable cardiac defibrillator) in place, MDT placed Jan 2013 [facility] consult cardiology for further testing.  Anemia of chronic disease: - Prerenal, monitor hemoglobin as she does have esophageal varices. Check a PT and an INR, her platelets are greater than 150     Consulted Nephrology and cardiology  Code Status: DNR Family  Communication: none Disposition Plan: inpatient  Time spent: 90 minutes  Marinda Elk [facility] Pager [phone]  If 7PM-7AM, please contact night-coverage [email or web address] Password [email or web address] 10/15/2012, 1:30 PM

## 2012-10-15 NOTE — ED Provider Notes (Signed)
History     CSN: [id number]  Arrival date & time 10/15/12  0822   First MD Initiated Contact with Patient 10/15/12 [phone]      Chief Complaint  Patient presents with  . Shortness of Breath    (Consider location/radiation/quality/duration/timing/severity/associated sxs/prior treatment) HPI Pt released from hospital 2 days ago after cardioversion from wide complex tachy and NSTEMI. States she was having SOB in the hospital that responded to neb treatment. Since d/c, SOB has worsened, esp when lying flat. +cough with clear mucous. +bl lower ext edema. Last HD was Thursday. No fever or chills. No pain.  Past Medical History  Diagnosis Date  . Coronary artery disease     s/p CABG 2008 with multiple PCIs  . Ischemic cardiomyopathy     Cath 04/2012, significant calcifications  . History of nonadherence to medical treatment   . Systolic CHF     04/9146 echo EF 10%  . Esophageal varices   . Cirrhosis     hx of fatty liver per patient, has known ascites with repeated paracenteses at [facility] as of 2013, also +hx of esoph varices with banding in the past  . Pacemaker   . ICD (implantable cardiac defibrillator) in place   . Hypertension     "used to have HTN; now I'm low" (05/23/2012)  . NSTEMI (non-ST elevated myocardial infarction)     04/2012; Trop peaked to 1.39; 05/23/2012 pt denies ever having MI  . Pneumonia 02/2012    "first time ever" (05/23/2012)  . Chronic bronchitis 1970's thru 2002    "went away when I stopped smoking in 2002" (05/23/2012)  . Diabetes mellitus     Diet controlled  . History of blood transfusion     "alot of them" (05/23/2012)  . Iron (Fe) deficiency anemia     "severe" (05/23/2012)  . Pancreatitis, chronic 10/04/2011    Had severe gallstone pancreatitis in May 2003, no surgery, treated medically then returned in June 2003 for cholecystectomy and cyst gastrostomy for pancreatic pseudocyst. In March 2013 was admitted for hemorrhage into psueodcyst. In April 2013 was  treated at [facility] for polymicrobial bacteremia (fungal, MRSA and enterobacter) felt to be due to ruptured pseudocyst, treated medically. Admitted Jan 2014 with abd pain and had gas and fluid in pancreatic bed. She refused surgery ("would not make it") and had fluid aspirated by IR- gram stain showed yeast and cultures grew enterococcus species, sensitive to amp and vanc. She was seen by ID and discharged on IV vanc/fortaz with HD and po voriconazole.     [name] ESRD on hemodialysis 05/06/2012    T,Th,Sat HD by [facility] on [address]. Started hemodialysis around July 2013.    [name] CHF (congestive heart failure)   . Complication of vascular access for dialysis 07/13/2012    L arm AVG (placed by Dr. Lyda Perone @ [facility]) was ligated 1/6 after significant L arm swelling secondary to L SCV stenosis and pacemaker; new access to be placed at right arm on 1/29 by Dr. Hollace Hayward      Past Surgical History  Procedure Laterality Date  . Esophagogastroduodenoscopy  09/15/2011    Procedure: ESOPHAGOGASTRODUODENOSCOPY (EGD);  Surgeon: Theda Belfast, MD;  Location: [facility] ENDOSCOPY;  Service: Endoscopy;  Laterality: N/A;  . Cholecystectomy  2003  . Tonsillectomy and adenoidectomy  1961  . Appendectomy  1973?  [name] Vaginal hysterectomy  1973?  [name] Tubal ligation  1972  . Dilation and curettage of uterus      "  bunch from profuse bleeding in the 1970's" (05/23/2012)  . Coronary artery bypass graft  2008    CABG X4  . Coronary angioplasty with stent placement  2008    "1; day after CABG" (05/23/2012)  . Cardiac catheterization      "before 2008 and 3 wk ago" (05/23/2012)  . Insert / replace / remove pacemaker  06/2011    pacemaker ICD  . Cardiac defibrillator placement  06/2011  . Arteriovenous graft placement  03/2012    right antecub  . Insertion of dialysis catheter  ~ 10/2011    right chest  . Peritoneal catheter insertion  08/2011  . Peritoneal catheter removal  ~ 10/2011    Family History  Problem Relation  Age of Onset  . Coronary artery disease Mother   . Hypertension Mother   . Diabetes Mother   . Diabetes Sister   . Anesthesia problems Neg Hx     History  Substance Use Topics  . Smoking status: Former Smoker -- 0.33 packs/day for 31 years    Types: Cigarettes    Quit date: 06/15/2000  . Smokeless tobacco: Never Used  . Alcohol Use: No     Comment: 05/23/2012 "drank from age [age]; never had problem w/it"    OB History   Grav Para Term Preterm Abortions TAB SAB Ect Mult Living                  Review of Systems  Constitutional: Negative for fever and chills.  Respiratory: Positive for cough and shortness of breath.   Cardiovascular: Positive for leg swelling. Negative for chest pain and palpitations.  Gastrointestinal: Negative for nausea, vomiting and abdominal pain.  Skin: Negative for rash and wound.  Neurological: Negative for dizziness, weakness, light-headedness, numbness and headaches.  All other systems reviewed and are negative.    Allergies  Codeine; Morphine and related; Ace inhibitors; and Tylenol  Home Medications   Current Outpatient Rx  Name  Route  Sig  Dispense  Refill  . amiodarone (PACERONE) 200 MG tablet   Oral   Take 1 tablet (200 mg total) by mouth 2 (two) times daily.   60 tablet   5   . aspirin EC 81 MG EC tablet   Oral   Take 1 tablet (81 mg total) by mouth daily.   30 tablet   0   . carvedilol (COREG) 3.125 MG tablet   Oral   Take 1.5 tablets (4.6875 mg total) by mouth 2 (two) times daily with a meal.   90 tablet   5   . cinacalcet (SENSIPAR) 30 MG tablet   Oral   Take 2 tablets (60 mg total) by mouth daily with breakfast.   30 tablet   0   . docusate sodium 100 MG CAPS   Oral   Take 100 mg by mouth 2 (two) times daily.         [name] HYDROmorphone (DILAUDID) 2 MG tablet   Oral   Take 1 tablet (2 mg total) by mouth every 6 (six) hours as needed (severe pain.). For pain   10 tablet   0   . methadone (DOLOPHINE) 5 MG  tablet   Oral   Take 1 tablet (5 mg total) by mouth 4 (four) times daily.   10 tablet   0   . multivitamin (RENA-VIT) TABS tablet   Oral   Take 1 tablet by mouth daily.         . nitroGLYCERIN (NITROSTAT)  0.4 MG SL tablet   Sublingual   Place 1 tablet (0.4 mg total) under the tongue every 5 (five) minutes as needed for chest pain.   25 tablet   12   . Nutritional Supplements (FEEDING SUPPLEMENT, NEPRO CARB STEADY,) LIQD   Oral   Take 237 mLs by mouth 2 (two) times daily with breakfast and lunch.         . pantoprazole (PROTONIX) 40 MG tablet   Oral   Take 40 mg by mouth daily.         . pravastatin (PRAVACHOL) 20 MG tablet   Oral   Take 20 mg by mouth at bedtime.          . ranolazine (RANEXA) 500 MG 12 hr tablet   Oral   Take 500 mg by mouth daily.          . sevelamer (RENAGEL) 400 MG tablet   Oral   Take 1 tablet (400 mg total) by mouth 3 (three) times daily with meals. With a meal   90 tablet   0     BP 115/67  Pulse 82  Temp(Src) 97.8 F (36.6 C) (Oral)  Resp 18  SpO2 96%  Physical Exam  Nursing note and vitals reviewed. Constitutional: She is oriented to person, place, and time. She appears well-developed and well-nourished. No distress.  HENT:  Head: Normocephalic and atraumatic.  Mouth/Throat: Oropharynx is clear and moist.  Eyes: EOM are normal. Pupils are equal, round, and reactive to light.  Neck: Normal range of motion. Neck supple.  Cardiovascular: Normal rate and regular rhythm.   Pulmonary/Chest: Effort normal. No respiratory distress. She has no wheezes. She has rales (crackles in bl bases).  Abdominal: Soft. Bowel sounds are normal. She exhibits no distension and no mass. There is no tenderness. There is no rebound and no guarding.  Musculoskeletal: Normal range of motion. She exhibits edema (2+ bl edema). She exhibits no tenderness.  Neurological: She is alert and oriented to person, place, and time.  Moves all ext, sensation  intact  Skin: Skin is warm and dry. No rash noted. No erythema.  Psychiatric: She has a normal mood and affect. Her behavior is normal.    ED Course  Procedures (including critical care time)  Labs Reviewed  CBC WITH DIFFERENTIAL - Abnormal; Notable for the following:    Hemoglobin 11.5 (*)    HCT 34.9 (*)    RDW 17.6 (*)    Neutrophils Relative 81 (*)    All other components within normal limits  PRO B NATRIURETIC PEPTIDE - Abnormal; Notable for the following:    Pro B Natriuretic peptide (BNP) >70000.0 (*)    All other components within normal limits  COMPREHENSIVE METABOLIC PANEL - Abnormal; Notable for the following:    Potassium 6.0 (*)    Glucose, Bld 117 (*)    BUN 36 (*)    Creatinine, Ser 1.96 (*)    Albumin 2.5 (*)    Alkaline Phosphatase 244 (*)    GFR calc non Af Amer 26 (*)    GFR calc Af Amer 30 (*)    All other components within normal limits  TROPONIN I - Abnormal; Notable for the following:    Troponin I 1.22 (*)    All other components within normal limits   Dg Chest Port 1 View  10/15/2012  *RADIOLOGY REPORT*  Clinical Data: Shortness of breath.  History of dialysis and CHF.  PORTABLE CHEST - 1 VIEW  Comparison: 10/08/2012  Findings: Right jugular dialysis catheter tip is in the right atrium.  There is a left cardiac ICD.  Heart remains enlarged for size.  Prominent interstitial densities suggest mild edema or vascular congestion.  Limited evaluation of the right costophrenic angle and cannot exclude a small right pleural effusion.  IMPRESSION: Cardiomegaly with mild interstitial edema or vascular congestion.   Original Report Authenticated By: Richarda Overlie, M.D.      1. Pulmonary edema   2. Hyperkalemia       Date: 10/15/2012  Rate: 81  Rhythm: normal sinus rhythm  QRS Axis: indeterminate  Intervals: QRS prolonged  ST/T Wave abnormalities: indeterminate  Conduction Disutrbances:left bundle branch block  Narrative Interpretation:   Old EKG Reviewed:  unchanged  CRITICAL CARE Performed by: Ranae Palms, Tamberlyn Midgley   Total critical care time: 30  Critical care time was exclusive of separately billable procedures and treating other patients.  Critical care was necessary to treat or prevent imminent or life-threatening deterioration.  Critical care was time spent personally by me on the following activities: development of treatment plan with patient and/or surrogate as well as nursing, discussions with consultants, evaluation of patient's response to treatment, examination of patient, obtaining history from patient or surrogate, ordering and performing treatments and interventions, ordering and review of laboratory studies, ordering and review of radiographic studies, pulse oximetry and re-evaluation of patient's condition. MDM  Pt requesting breathing treatment. Though symptoms likely due to fluid overload will give low dose albuterol trial.  Discussed with Dr Rennis Golden. Will see pt in consult.  Dr Ervin Knack to dialyze as inpt.  [facility] to see pt in ED.         Loren Racer, MD 10/15/12 1224

## 2012-10-15 NOTE — Consult Note (Signed)
Pt. Seen and examined. Agree with the NP/PA-C note as written.  Well known from recent hospitalization on our service with sustained slower VT and cardioversion in the ER.  She suffered NSTEMI, which seemed out of proportion to VT. She had a cardiac cath which revealed no significant new obstructions.  EF most recently on echo is ~10%.  She last had dialysis on Thursday, but was markedly more dyspneic Friday and finally presented today to the hospital in extremis. Exam consistent with decompensated heart failure +/- volume overload secondary to ESRD. Troponin is positive, but she does not describe chest pain and is trending less than her recent hospitalization, so we may be seeing slow decline due to her renal failure in clearing troponin. Plan for urgent dialysis due to volume overload and hypokalemia. Overall prognosis is poor.  She is DNR. Will follow with you - continue current cardiac meds except ranexa.  Chrystie Nose, MD, [facility] Attending Cardiologist [facility]

## 2012-10-15 NOTE — ED Notes (Addendum)
Critical lab value troponin 1.22, Dr. Cheral Almas Notified.

## 2012-10-15 NOTE — ED Notes (Signed)
[facility] paged to [phone]

## 2012-10-16 ENCOUNTER — Inpatient Hospital Stay (HOSPITAL_COMMUNITY): Payer: Non-veteran care

## 2012-10-16 ENCOUNTER — Encounter (HOSPITAL_COMMUNITY): Payer: Self-pay | Admitting: *Deleted

## 2012-10-16 DIAGNOSIS — I519 Heart disease, unspecified: Secondary | ICD-10-CM | POA: Diagnosis present

## 2012-10-16 DIAGNOSIS — Z515 Encounter for palliative care: Secondary | ICD-10-CM

## 2012-10-16 DIAGNOSIS — E43 Unspecified severe protein-calorie malnutrition: Secondary | ICD-10-CM | POA: Diagnosis present

## 2012-10-16 LAB — CBC
Platelets: 100 10*3/uL — ABNORMAL LOW (ref 150–400)
RBC: 3.31 MIL/uL — ABNORMAL LOW (ref 3.87–5.11)
WBC: 5.6 10*3/uL (ref 4.0–10.5)

## 2012-10-16 LAB — PROTIME-INR
INR: 1.25 (ref 0.00–1.49)
Prothrombin Time: 15.5 seconds — ABNORMAL HIGH (ref 11.6–15.2)

## 2012-10-16 LAB — BASIC METABOLIC PANEL
CO2: 26 mEq/L (ref 19–32)
Calcium: 8.9 mg/dL (ref 8.4–10.5)
GFR calc Af Amer: 65 mL/min — ABNORMAL LOW (ref >90)
GFR calc non Af Amer: 56 mL/min — ABNORMAL LOW (ref >90)
Sodium: 136 mEq/L (ref 135–145)

## 2012-10-16 LAB — GLUCOSE, CAPILLARY
Glucose-Capillary: 61 mg/dL — ABNORMAL LOW (ref 70–99)
Glucose-Capillary: 135 mg/dL — ABNORMAL HIGH (ref 70–99)

## 2012-10-16 MED ORDER — DARBEPOETIN ALFA-POLYSORBATE 200 MCG/0.4ML IJ SOLN
200.0000 ug | INTRAMUSCULAR | Status: DC
  Filled 2012-10-16: qty 0.4

## 2012-10-16 MED ORDER — GLUCOSE 40 % PO GEL
ORAL | Status: AC
  Filled 2012-10-16: qty 1

## 2012-10-16 MED ORDER — IPRATROPIUM BROMIDE 0.02 % IN SOLN
0.5000 mg | Freq: Three times a day (TID) | RESPIRATORY_TRACT | Status: DC
  Administered 2012-10-16 – 2012-10-17 (×6): 0.5 mg via RESPIRATORY_TRACT
  Filled 2012-10-16 (×4): qty 2.5

## 2012-10-16 MED ORDER — IPRATROPIUM BROMIDE 0.02 % IN SOLN
RESPIRATORY_TRACT | Status: AC
  Filled 2012-10-16: qty 2.5

## 2012-10-16 MED ORDER — ALBUTEROL SULFATE (5 MG/ML) 0.5% IN NEBU
2.5000 mg | INHALATION_SOLUTION | RESPIRATORY_TRACT | Status: DC | PRN
  Administered 2012-10-17 – 2012-10-18 (×3): 2.5 mg via RESPIRATORY_TRACT
  Filled 2012-10-16 (×2): qty 0.5

## 2012-10-16 MED ORDER — ALBUTEROL SULFATE (5 MG/ML) 0.5% IN NEBU
INHALATION_SOLUTION | RESPIRATORY_TRACT | Status: AC
  Filled 2012-10-16: qty 0.5

## 2012-10-16 MED ORDER — ALBUTEROL SULFATE (5 MG/ML) 0.5% IN NEBU
2.5000 mg | INHALATION_SOLUTION | Freq: Three times a day (TID) | RESPIRATORY_TRACT | Status: DC
  Administered 2012-10-16 – 2012-10-17 (×5): 2.5 mg via RESPIRATORY_TRACT
  Filled 2012-10-16 (×4): qty 0.5

## 2012-10-16 NOTE — Progress Notes (Signed)
[facility] Progress Note  Subjective:  Frustrated and tearful. Wants to go home. Fears that her transfer to [facility] will be in jeopardy if she is not able to start HD there tomorrow. Is aware of her poor prognosis but wishes that doctors would stop asking her if she is ready to give up/consider hospice. 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 Belly less tender today. Wants a paracentesis. Not sure what happened upon discharge- I thought she was going to her unit on Saturday.  I could have told someone that she was not making it until Monday for her next dialysis treatment.    Objective Filed Vitals:   10/15/12 2313 10/16/12 0147 10/16/12 0537 10/16/12 0820  BP: 108/76  110/64   Pulse: 77 78 78   Temp: 97.8 F (36.6 C)  98.6 F (37 C)   TempSrc: Axillary  Oral   Resp: 20 20 20    Height: 5\' 4"  (1.626 m)     Weight:      SpO2: 96% 97% 94% 94%   Physical Exam General: Frail, NAD Heart: RRR, ICD left chest Lungs: CTA bilaterally. No wheezes, rales, rhonchi Abdomen: Distended, non-tender, normal BS Extremities: 3+ LE pitting edema Dialysis Access: Rt I-J  Outpatient HD: MWF @ [facility] - Was to start there 5/5. (previously at [facility])  Optiflux 160. Time: 4:00. 4K/2.5Ca bath. EDW ~45.5 kg (needs titration due to freq hospitalizations) Rt I-J BFR 400/DFR 800  Tight Heparin. Epogen 28,000 u, No IV fe or Hectorol (needs updated labs)  Assessment/Plan: 1. Pulmonary edema - per admit, secondary to Acute systolic congestive heart failure/Chronic systolic congestive heart failure, NYHA class 3/ Ischemic cardiomyopathy, EF now 10% by cath 05/06/12 - Pulmonary edema on CXR with 3+ pitting LE edema. Fluid removal challenging last admission due to BP and cardiomyopathy- On coreg. Cough remains. Troponins decreasing so no concern for ACS at this time. Repeat CXR. This is difficult situation 2. ESRD/ Hyperkalemia - K+ 6.0. Emergent HD  saturday. Will continue to monitor K+ at discharge and adjust bath in op orders if needed. 3. Abdominal Pain/ Chronic Pancreatitis - CT shows complex ascites in pelvis, loculated and is compressing sigmoid colon, bladder, and displacing small bowel. Considering paracentesis. Continue dilaudid and methadone per primary. 4. HTN/Volume - SBPs stable in 110s- Post wgt 44.9 with net UF 1391cc. BP limiting fluid removal. Ascites contributing to her wgt. Will repeat CXR. Pt is willing to consider 4 x week HD - Not sure if she can tolerate a 2 day break/weekend. Will try cooling dialysate for tomorrow. Consider Midodrine pre-hd.  She will need 4 time a week and needs to have this in place prior to discharge to avoid rehosp 5. SVT/ NSTEMI - s/p cardioversion on 5/1, Cardiac cath and ICD reprogrammed. Troponin positive but decreasing, EF of 10%; on amiodarone, low dose coreg bid.  6. Anemia - Hgb 9.6 <11.5 > 7.9 s/p max Aranesp and transfusion last admission. Tsat 28%. Ferritin 5039 - ? Inflammation vs lab error. Resume max Aranesp. 7. Secondary hyperparathyroidism- Ca 8.9(10.1 corrected) P 2.3 - Renvela d/c'd last admission. Continue sensipar. No op hectorol. Recheck PTH. 8. Nutrition - Alb 2.5; NPO for now. Will allow heart healthy regular diet with fluid restriction.Nepro. Multivitamin 9. Dispo - Pt aware of poor prognosis. She wants limited code (amenable to cardioversion and antiarrythmics.) Does not want full comfort care at this time.   Clydie Braun  Delle Reining, PA-C [facility] Pager [phone] 10/16/2012,10:39 AM  LOS: 1 day   Patient seen and examined, agree with above note with above modifications.  Bounced back with SOB- better after HD but only got 1.4 off due to BP- needs 4 time a week HD at discharge.  Everyone agrees poor prognosis - pt not ready for full comfort at this time Annie Sable, MD 10/16/2012      Additional Objective Labs: Basic Metabolic Panel:  Recent Labs Lab  10/10/12 0703  10/12/12 1478 10/13/12 0648 10/15/12 0950 10/15/12 1501 10/16/12 0530  NA 133*  < > 129* 134* 135  --  136  K 4.0  < > 3.6 3.9 6.0*  --  4.0  CL 95*  < > 93* 98 97  --  102  CO2 31  < > 27 29 26   --  26  GLUCOSE 85  < > 92 134* 117*  --  63*  BUN 36*  < > 34* 23 36*  --  12  CREATININE 2.50*  < > 2.35* 1.56* 1.96* 2.10* 1.04  CALCIUM 9.9  < > 8.9 9.0 10.4  --  8.9  PHOS 4.1  --  2.9 2.3  --   --   --   < > = values in this interval not displayed. Liver Function Tests:  Recent Labs Lab 10/12/12 [phone] 10/13/12 0648 10/15/12 0950  AST  --   --  34  ALT  --   --  16  ALKPHOS  --   --  244*  BILITOT  --   --  0.7  PROT  --   --  7.5  ALBUMIN 2.4* 2.3* 2.5*   No results found for this basename: LIPASE, AMYLASE,  in the last 168 hours CBC:  Recent Labs Lab 10/12/12 0635 10/13/12 0600 10/15/12 0950 10/15/12 1501 10/16/12 0530  WBC 9.2 5.7 8.2 8.6 5.6  NEUTROABS  --   --  6.7  --   --   HGB 7.9* 8.0* 11.5* 11.0* 9.6*  HCT 23.8* 24.3* 34.9* 33.1* 29.8*  MCV 89.8 90.0 88.8 88.7 90.0  PLT 125* 105* 183 166 100*   Blood Culture    Component Value Date/Time   SDES BLOOD LEFT HAND 08/20/2012 2200   SPECREQUEST BOTTLES DRAWN AEROBIC AND ANAEROBIC 10CC EACH 08/20/2012 2200   CULT NO GROWTH 5 DAYS 08/20/2012 2200   REPTSTATUS 08/29/2012 FINAL 08/20/2012 2200    Cardiac Enzymes:  Recent Labs Lab 10/09/12 2105 10/15/12 0926 10/15/12 1501 10/15/12 1811 10/16/12 0212  CKTOTAL  --   --  30  --   --   CKMB  --   --  3.1  --   --   TROPONINI 8.70* 1.22* 1.17* 1.29* 0.93*   CBG:  Recent Labs Lab 10/12/12 2117 10/15/12 1335 10/15/12 2308 10/16/12 0128 10/16/12 0955  GLUCAP 135* 112* 59* 77 80   Iron Studies:  Recent Labs  10/15/12 1501  IRON 45  TIBC 160*  FERRITIN 5039*   @lablastinr3 @ Studies/Results: Ct Abdomen Pelvis W Contrast  10/15/2012  *RADIOLOGY REPORT*  Clinical Data: Lower abdominal pain with nausea and vomiting. Constipation.   Diabetic on dialysis over 1 year.  Prior appendectomy, hysterectomy and cholecystectomy.  CT ABDOMEN AND PELVIS WITH CONTRAST  Technique:  Multidetector CT imaging of the abdomen and pelvis was performed following the standard protocol during bolus administration of intravenous contrast.  Contrast: 80mL OMNIPAQUE IOHEXOL 300 MG/ML  SOLN  Comparison: 08/19/2012.  Findings:  AICD is in place.  Marked cardiomegaly.  Diffuse ascites complex with a loculated appearance. In the pelvis, loculated ascites is displacing sigmoid colon posteriorly and small bowel away from the pelvis.  Areas of loculated free air surround the spleen.  This may represent residua of infection previously noted.  Upper left lateral abdominal wall abscess remains measuring 2.5 x 4.4 x 5.5 cm with a loculated septated appearance.  Overall dimensions without significant change.  Abnormal appearance left lobe liver.  This may represent alterations in vascular supply to the liver rather than primary mass.  Small kidneys with calcifications and cysts.  Atherosclerotic type changes aorta with ectasia.  Atherosclerotic type changes iliac arteries with ectasia and narrowing.  Prominent branch vessel atherosclerotic type changes.  Right pleural effusion.  Right base infiltrate/atelectasis. Minimal atelectatic changes left base.  No obvious pancreatic abnormality.  Hyperplasia adrenal glands.  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  IMPRESSION: Complex ascites.  In the pelvis this appears loculated and is compressing the sigmoid colon, bladder and displacing small bowel from the pelvis.  Areas of loculated free air surround the spleen.  This may represent residua of infection previously noted.  Upper left lateral wall abscess is septated and has overall dimensions relatively similar to the prior exam.  Marked cardiomegaly.  Right pleural effusion.  Right base infiltrate/atelectasis. Minimal atelectatic changes left base.  Significant atherosclerotic  type changes.  Renal osteodystrophy changes of the spine.   Original Report Authenticated By: Lacy Duverney, M.D.    Dg Chest Port 1 View  10/15/2012  *RADIOLOGY REPORT*  Clinical Data: Shortness of breath.  History of dialysis and CHF.  PORTABLE CHEST - 1 VIEW  Comparison: 10/08/2012  Findings: Right jugular dialysis catheter tip is in the right atrium.  There is a left cardiac ICD.  Heart remains enlarged for size.  Prominent interstitial densities suggest mild edema or vascular congestion.  Limited evaluation of the right costophrenic angle and cannot exclude a small right pleural effusion.  IMPRESSION: Cardiomegaly with mild interstitial edema or vascular congestion.   Original Report Authenticated By: Richarda Overlie, M.D.    Medications:   . albuterol  2.5 mg Nebulization TID  . amiodarone  200 mg Oral BID  . aspirin EC  81 mg Oral Daily  . carvedilol  4.6875 mg Oral BID WC  . cinacalcet  60 mg Oral Q breakfast  . dextrose      . dextrose      . docusate sodium  100 mg Oral BID  . feeding supplement (NEPRO CARB STEADY)  237 mL Oral BID WC  . heparin  5,000 Units Subcutaneous Q8H  . ipratropium  0.5 mg Nebulization TID  . methadone  5 mg Oral QID  . multivitamin  1 tablet Oral Daily  . pantoprazole  40 mg Oral Daily  . simvastatin  20 mg Oral q1800  . sodium chloride  3 mL Intravenous Q12H  . sodium chloride  3 mL Intravenous Q12H

## 2012-10-16 NOTE — Progress Notes (Signed)
10/16/12 1106 nsg Pt is high fall risk but insists on sitting on side of the bed; pt is alert and oriented.

## 2012-10-16 NOTE — Progress Notes (Signed)
Patient GN:FAOZHYQ [name]      DOB: [birth year]      MVH:[id number]  Goals of care discussion with patient, her daughter, [name] (in from [place]) and patients son, [name] [facility], on phone). We talked about patients cardiac status and current difficulty medically managing her fluid overload due to low blood pressure and overall poor quality heart function. Patient is a retired Designer, jewellery and is familiar with extent of her medical issues, and understands overall poor outcome, despite aggressive medical intervention.   We talked in depth about resuscitation measures and the overall likelihood of poor survival ability related to her chronic progressive conditions. We also discussed what resuscitation would entail and the risk and burdens including the possibility of mechanical ventilation. And in the event if she was unable to survive without life support the fact that her children would have to make the decision for withdrawal of aggressive support.   Goals of Care:  1. Code Status: Full  Would want full resuscitation attempt, including cardioversion, antiarrythmic and ventilatory pulmonary support  2. Scope of Treatment:  1. Vital Signs:routine 2. AICD: continue  3. Respiratory/Oxygen:support as indicated 4. Nutritional Support/Tube Feeds: No artificial tube feedings 5. Antibiotics:yes for identified infections 6. Blood Products: yes as indicated 7. IVF: for defined period of time 8. Review of Medications to be discontinued: none 9. Labs: as indicated 10. Telemetry: yes   Full Palliative care note to follow  [name], [name] Palliative Medicine Team [facility] Team Phone: [phone] Pager: [phone]

## 2012-10-16 NOTE — Progress Notes (Signed)
[facility]  DAILY PROGRESS NOTE   Subjective:  Feels shaky today. Had HD yesterday, but difficulty with UF secondary to hypotension. Complains of abdominal fullness.  Objective:  Temp:  [97.7 F (36.5 C)-98.6 F (37 C)] 98.6 F (37 C) (05/04 0537) Pulse Rate:  [68-87] 78 (05/04 0537) Resp:  [15-28] 20 (05/04 0537) BP: (82-128)/(45-82) 110/64 mmHg (05/04 0537) SpO2:  [89 %-100 %] 94 % (05/04 0820) Weight:  [44.9 kg (98 lb 15.8 oz)-47.2 kg (104 lb 0.9 oz)] 44.9 kg (98 lb 15.8 oz) (05/03 2215) Weight change:   Intake/Output from previous day: 05/03 0701 - 05/04 0700 In: -  Out: 1391   Intake/Output from this shift:    Medications: Current Facility-Administered Medications  Medication Dose Route Frequency Provider Last Rate Last Dose  . 0.9 %  sodium chloride infusion  250 mL Intravenous PRN Marinda Elk, MD      . albuterol (PROVENTIL) (5 MG/ML) 0.5% nebulizer solution 2.5 mg  2.5 mg Nebulization TID Marinda Elk, MD   2.5 mg at 10/16/12 0817  . albuterol (PROVENTIL) (5 MG/ML) 0.5% nebulizer solution 2.5 mg  2.5 mg Nebulization Q4H PRN Marinda Elk, MD      . amiodarone (PACERONE) tablet 200 mg  200 mg Oral BID Marinda Elk, MD   200 mg at 10/16/12 0025  . aspirin EC tablet 81 mg  81 mg Oral Daily Marinda Elk, MD      . carvedilol (COREG) tablet 4.6875 mg  4.6875 mg Oral BID WC Marinda Elk, MD   3.125 mg at 10/16/12 0856  . cinacalcet (SENSIPAR) tablet 60 mg  60 mg Oral Q breakfast Marinda Elk, MD      . dextrose (GLUTOSE) 40 % oral gel           . dextrose 50 % solution           . docusate sodium (COLACE) capsule 100 mg  100 mg Oral BID Marinda Elk, MD   100 mg at 10/16/12 0030  . feeding supplement (NEPRO CARB STEADY) liquid 237 mL  237 mL Oral BID WC Marinda Elk, MD      . heparin injection 5,000 Units  5,000 Units Subcutaneous Q8H Marinda Elk, MD   5,000 Units at  10/16/12 0022  . HYDROmorphone (DILAUDID) injection 1 mg  1 mg Intravenous Q2H PRN Marinda Elk, MD   1 mg at 10/15/12 2346  . HYDROmorphone (DILAUDID) tablet 2 mg  2 mg Oral Q6H PRN Marinda Elk, MD      . ipratropium (ATROVENT) nebulizer solution 0.5 mg  0.5 mg Nebulization TID Marinda Elk, MD   0.5 mg at 10/16/12 0817  . methadone (DOLOPHINE) tablet 5 mg  5 mg Oral QID Marinda Elk, MD   5 mg at 10/16/12 0024  . multivitamin (RENA-VIT) tablet 1 tablet  1 tablet Oral Daily Marinda Elk, MD      . nitroGLYCERIN (NITROSTAT) SL tablet 0.4 mg  0.4 mg Sublingual Q5 min PRN Marinda Elk, MD      . ondansetron [facility]) tablet 4 mg  4 mg Oral Q6H PRN Marinda Elk, MD       Or  . ondansetron [facility]) injection 4 mg  4 mg Intravenous Q6H PRN Marinda Elk, MD   4 mg at 10/15/12 1436  . pantoprazole (PROTONIX) EC tablet 40 mg  40 mg Oral Daily Darin Engels  David Stall, MD   40 mg at 10/16/12 0024  . polyethylene glycol (MIRALAX / GLYCOLAX) packet 17 g  17 g Oral Daily PRN Marinda Elk, MD      . simvastatin (ZOCOR) tablet 20 mg  20 mg Oral q1800 Marinda Elk, MD   20 mg at 10/16/12 0022  . sodium chloride 0.9 % injection 3 mL  3 mL Intravenous Q12H Marinda Elk, MD   3 mL at 10/16/12 0030  . sodium chloride 0.9 % injection 3 mL  3 mL Intravenous Q12H Marinda Elk, MD   3 mL at 10/16/12 0031  . sodium chloride 0.9 % injection 3 mL  3 mL Intravenous PRN Marinda Elk, MD        Physical Exam: General appearance: alert and no distress Neck: JVD - 4 cm above sternal notch, no adenopathy, no carotid bruit, supple, symmetrical, trachea midline and thyroid not enlarged, symmetric, no tenderness/mass/nodules Lungs: clear to auscultation bilaterally Heart: regular rate and rhythm, S1, S2 normal, no murmur, click, rub or gallop Abdomen: distended, soft, mild fluid wave, liver does not appear enlarged Extremities: extremities  normal, atraumatic, no cyanosis or edema Pulses: 2+ and symmetric Skin: Skin color, texture, turgor normal. No rashes or lesions Neurologic: Grossly normal  Lab Results: Results for orders placed during the hospital encounter of 10/15/12 (from the past 48 hour(s))  PRO B NATRIURETIC PEPTIDE     Status: Abnormal   Collection Time    10/15/12  9:26 AM      Result Value Range   Pro B Natriuretic peptide (BNP) >70000.0 (*) 0 - 125 pg/mL  TROPONIN I     Status: Abnormal   Collection Time    10/15/12  9:26 AM      Result Value Range   Troponin I 1.22 (*) <0.30 ng/mL   Comment:            Due to the release kinetics of cTnI,     a negative result within the first hours     of the onset of symptoms does not rule out     myocardial infarction with certainty.     If myocardial infarction is still suspected,     repeat the test at appropriate intervals.     CRITICAL RESULT CALLED TO, READ BACK BY AND VERIFIED WITH:     RYUAL K,RN 1108 10/15/12 SCALES H  CBC WITH DIFFERENTIAL     Status: Abnormal   Collection Time    10/15/12  9:50 AM      Result Value Range   WBC 8.2  4.0 - 10.5 K/uL   RBC 3.93  3.87 - 5.11 MIL/uL   Hemoglobin 11.5 (*) 12.0 - 15.0 g/dL   HCT 16.1 (*) 09.6 - 04.5 %   MCV 88.8  78.0 - 100.0 fL   MCH 29.3  26.0 - 34.0 pg   MCHC 33.0  30.0 - 36.0 g/dL   RDW 40.9 (*) 81.1 - 91.4 %   Platelets 183  150 - 400 K/uL   Neutrophils Relative 81 (*) 43 - 77 %   Neutro Abs 6.7  1.7 - 7.7 K/uL   Lymphocytes Relative 12  12 - 46 %   Lymphs Abs 1.0  0.7 - 4.0 K/uL   Monocytes Relative 6  3 - 12 %   Monocytes Absolute 0.5  0.1 - 1.0 K/uL   Eosinophils Relative 0  0 - 5 %   Eosinophils Absolute 0.0  0.0 - 0.7 K/uL   Basophils Relative 1  0 - 1 %   Basophils Absolute 0.0  0.0 - 0.1 K/uL  COMPREHENSIVE METABOLIC PANEL     Status: Abnormal   Collection Time    10/15/12  9:50 AM      Result Value Range   Sodium 135  135 - 145 mEq/L   Potassium 6.0 (*) 3.5 - 5.1 mEq/L   Chloride 97   96 - 112 mEq/L   CO2 26  19 - 32 mEq/L   Glucose, Bld 117 (*) 70 - 99 mg/dL   BUN 36 (*) 6 - 23 mg/dL   Creatinine, Ser 9.60 (*) 0.50 - 1.10 mg/dL   Calcium 45.4  8.4 - 09.8 mg/dL   Total Protein 7.5  6.0 - 8.3 g/dL   Albumin 2.5 (*) 3.5 - 5.2 g/dL   AST 34  0 - 37 U/L   ALT 16  0 - 35 U/L   Alkaline Phosphatase 244 (*) 39 - 117 U/L   Total Bilirubin 0.7  0.3 - 1.2 mg/dL   GFR calc non Af Amer 26 (*) >90 mL/min   GFR calc Af Amer 30 (*) >90 mL/min   Comment:            The eGFR has been calculated     using the CKD EPI equation.     This calculation has not been     validated in all clinical     situations.     eGFR's persistently     <90 mL/min signify     possible Chronic Kidney Disease.  GLUCOSE, CAPILLARY     Status: Abnormal   Collection Time    10/15/12  1:35 PM      Result Value Range   Glucose-Capillary 112 (*) 70 - 99 mg/dL  CBC     Status: Abnormal   Collection Time    10/15/12  3:01 PM      Result Value Range   WBC 8.6  4.0 - 10.5 K/uL   RBC 3.73 (*) 3.87 - 5.11 MIL/uL   Hemoglobin 11.0 (*) 12.0 - 15.0 g/dL   HCT 11.9 (*) 14.7 - 82.9 %   MCV 88.7  78.0 - 100.0 fL   MCH 29.5  26.0 - 34.0 pg   MCHC 33.2  30.0 - 36.0 g/dL   RDW 56.2 (*) 13.0 - 86.5 %   Platelets 166  150 - 400 K/uL  CREATININE, SERUM     Status: Abnormal   Collection Time    10/15/12  3:01 PM      Result Value Range   Creatinine, Ser 2.10 (*) 0.50 - 1.10 mg/dL   GFR calc non Af Amer 24 (*) >90 mL/min   GFR calc Af Amer 28 (*) >90 mL/min   Comment:            The eGFR has been calculated     using the CKD EPI equation.     This calculation has not been     validated in all clinical     situations.     eGFR's persistently     <90 mL/min signify     possible Chronic Kidney Disease.  CK TOTAL AND CKMB     Status: None   Collection Time    10/15/12  3:01 PM      Result Value Range   Total CK 30  7 - 177 U/L   CK, MB 3.1  0.3 -  4.0 ng/mL   Relative Index RELATIVE INDEX IS INVALID  0.0  - 2.5   Comment: WHEN CK < 100 U/L             TROPONIN I     Status: Abnormal   Collection Time    10/15/12  3:01 PM      Result Value Range   Troponin I 1.17 (*) <0.30 ng/mL   Comment:            Due to the release kinetics of cTnI,     a negative result within the first hours     of the onset of symptoms does not rule out     myocardial infarction with certainty.     If myocardial infarction is still suspected,     repeat the test at appropriate intervals.     CRITICAL VALUE NOTED.  VALUE IS CONSISTENT WITH PREVIOUSLY REPORTED AND CALLED VALUE.  FERRITIN     Status: Abnormal   Collection Time    10/15/12  3:01 PM      Result Value Range   Ferritin 5039 (*) 10 - 291 ng/mL  IRON AND TIBC     Status: Abnormal   Collection Time    10/15/12  3:01 PM      Result Value Range   Iron 45  42 - 135 ug/dL   TIBC 161 (*) 096 - 045 ug/dL   Saturation Ratios 28  20 - 55 %   UIBC 115 (*) 125 - 400 ug/dL  TROPONIN I     Status: Abnormal   Collection Time    10/15/12  6:11 PM      Result Value Range   Troponin I 1.29 (*) <0.30 ng/mL   Comment:            Due to the release kinetics of cTnI,     a negative result within the first hours     of the onset of symptoms does not rule out     myocardial infarction with certainty.     If myocardial infarction is still suspected,     repeat the test at appropriate intervals.     CRITICAL VALUE NOTED.  VALUE IS CONSISTENT WITH PREVIOUSLY REPORTED AND CALLED VALUE.  GLUCOSE, CAPILLARY     Status: Abnormal   Collection Time    10/15/12 11:08 PM      Result Value Range   Glucose-Capillary 59 (*) 70 - 99 mg/dL   Comment 1 Documented in Chart     Comment 2 Notify RN    GLUCOSE, CAPILLARY     Status: None   Collection Time    10/16/12  1:28 AM      Result Value Range   Glucose-Capillary 77  70 - 99 mg/dL  TROPONIN I     Status: Abnormal   Collection Time    10/16/12  2:12 AM      Result Value Range   Troponin I 0.93 (*) <0.30 ng/mL    Comment:            Due to the release kinetics of cTnI,     a negative result within the first hours     of the onset of symptoms does not rule out     myocardial infarction with certainty.     If myocardial infarction is still suspected,     repeat the test at appropriate intervals.     CRITICAL VALUE NOTED.  VALUE IS CONSISTENT WITH  PREVIOUSLY REPORTED AND CALLED VALUE.  BASIC METABOLIC PANEL     Status: Abnormal   Collection Time    10/16/12  5:30 AM      Result Value Range   Sodium 136  135 - 145 mEq/L   Potassium 4.0  3.5 - 5.1 mEq/L   Comment: DELTA CHECK NOTED   Chloride 102  96 - 112 mEq/L   CO2 26  19 - 32 mEq/L   Glucose, Bld 63 (*) 70 - 99 mg/dL   BUN 12  6 - 23 mg/dL   Comment: DELTA CHECK NOTED   Creatinine, Ser 1.04  0.50 - 1.10 mg/dL   Comment: DELTA CHECK NOTED   Calcium 8.9  8.4 - 10.5 mg/dL   GFR calc non Af Amer 56 (*) >90 mL/min   GFR calc Af Amer 65 (*) >90 mL/min   Comment:            The eGFR has been calculated     using the CKD EPI equation.     This calculation has not been     validated in all clinical     situations.     eGFR's persistently     <90 mL/min signify     possible Chronic Kidney Disease.  CBC     Status: Abnormal   Collection Time    10/16/12  5:30 AM      Result Value Range   WBC 5.6  4.0 - 10.5 K/uL   RBC 3.31 (*) 3.87 - 5.11 MIL/uL   Hemoglobin 9.6 (*) 12.0 - 15.0 g/dL   HCT 46.9 (*) 62.9 - 52.8 %   MCV 90.0  78.0 - 100.0 fL   MCH 29.0  26.0 - 34.0 pg   MCHC 32.2  30.0 - 36.0 g/dL   RDW 41.3 (*) 24.4 - 01.0 %   Platelets 100 (*) 150 - 400 K/uL   Comment: DELTA CHECK NOTED     PLATELET COUNT CONFIRMED BY SMEAR  PROTIME-INR     Status: Abnormal   Collection Time    10/16/12  5:30 AM      Result Value Range   Prothrombin Time 15.5 (*) 11.6 - 15.2 seconds   INR 1.25  0.00 - 1.49    Imaging: Ct Abdomen Pelvis W Contrast  10/15/2012  *RADIOLOGY REPORT*  Clinical Data: Lower abdominal pain with nausea and vomiting.  Constipation.  Diabetic on dialysis over 1 year.  Prior appendectomy, hysterectomy and cholecystectomy.  CT ABDOMEN AND PELVIS WITH CONTRAST  Technique:  Multidetector CT imaging of the abdomen and pelvis was performed following the standard protocol during bolus administration of intravenous contrast.  Contrast: 80mL OMNIPAQUE IOHEXOL 300 MG/ML  SOLN  Comparison: 08/19/2012.  Findings: AICD is in place.  Marked cardiomegaly.  Diffuse ascites complex with a loculated appearance. In the pelvis, loculated ascites is displacing sigmoid colon posteriorly and small bowel away from the pelvis.  Areas of loculated free air surround the spleen.  This may represent residua of infection previously noted.  Upper left lateral abdominal wall abscess remains measuring 2.5 x 4.4 x 5.5 cm with a loculated septated appearance.  Overall dimensions without significant change.  Abnormal appearance left lobe liver.  This may represent alterations in vascular supply to the liver rather than primary mass.  Small kidneys with calcifications and cysts.  Atherosclerotic type changes aorta with ectasia.  Atherosclerotic type changes iliac arteries with ectasia and narrowing.  Prominent branch vessel atherosclerotic type changes.  Right pleural  effusion.  Right base infiltrate/atelectasis. Minimal atelectatic changes left base.  No obvious pancreatic abnormality.  Hyperplasia adrenal glands.  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  IMPRESSION: Complex ascites.  In the pelvis this appears loculated and is compressing the sigmoid colon, bladder and displacing small bowel from the pelvis.  Areas of loculated free air surround the spleen.  This may represent residua of infection previously noted.  Upper left lateral wall abscess is septated and has overall dimensions relatively similar to the prior exam.  Marked cardiomegaly.  Right pleural effusion.  Right base infiltrate/atelectasis. Minimal atelectatic changes left base.  Significant  atherosclerotic type changes.  Renal osteodystrophy changes of the spine.   Original Report Authenticated By: Lacy Duverney, M.D.    Dg Chest Port 1 View  10/15/2012  *RADIOLOGY REPORT*  Clinical Data: Shortness of breath.  History of dialysis and CHF.  PORTABLE CHEST - 1 VIEW  Comparison: 10/08/2012  Findings: Right jugular dialysis catheter tip is in the right atrium.  There is a left cardiac ICD.  Heart remains enlarged for size.  Prominent interstitial densities suggest mild edema or vascular congestion.  Limited evaluation of the right costophrenic angle and cannot exclude a small right pleural effusion.  IMPRESSION: Cardiomegaly with mild interstitial edema or vascular congestion.   Original Report Authenticated By: Richarda Overlie, M.D.     Assessment:  1. Active Problems: 2.   Ischemic cardiomyopathy, EF 20-25% echo April 2013, now 10% by cath (05/06/12) 3.   Chronic systolic congestive heart failure, NYHA class 3 4.   LBBB (left bundle branch block) 5.   Hypertension 6.   Pancreatitis, chronic 7.   ESRD on hemodialysis 8.   CAD, CABG X 4 Feb '08, LAD stent June '08 9.   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   Acute on chronic systolic heart failure 11.   Hx of non-ST elevation myocardial infarction (NSTEMI), 10/10/12 12.   Hyperkalemic syndrome 13.   End of life care 14.   Plan:  1. Unfortunately, having problems with hypotension on dialysis.  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 She is complaining of ascites, however, CT demonstrated loculation and it may not be amenable to paracentesis. We may need to consider discussion about pallative care - she is aware that she is declining.   Time Spent Directly with Patient:  15 minutes  Length of Stay:  LOS: 1 day   Chrystie Nose, MD, [facility] Attending Cardiologist [facility]  HILTY,Kenneth C 10/16/2012, 9:49  AM

## 2012-10-16 NOTE — Progress Notes (Signed)
10/16/12 1200 nsg Dr. Susie Cassette notified that Paracentesis will be done tomorrow morning per Ultrasound.

## 2012-10-16 NOTE — Progress Notes (Signed)
[facility] PROGRESS NOTE  [name] ZOX:[id number] DOB: May 13, 1951 DOA: 10/15/2012 PCP: Jyl Heinz, MD  Assessment/Plan: Active Problems:   Ischemic cardiomyopathy, EF 20-25% echo April 2013, now 10% by cath (05/06/12)   Chronic systolic congestive heart failure, NYHA class 3   LBBB (left bundle branch block)   Hypertension   Pancreatitis, chronic   ESRD on hemodialysis   CAD, CABG X 4 Feb '08, LAD stent June '08   ICD (implantable cardiac defibrillator) in place, MDT placed Jan 2013 [facility] [place]   Acute on chronic systolic heart failure   Hx of non-ST elevation myocardial infarction (NSTEMI), 10/10/12   Hyperkalemic syndrome   End of life care     Pulmonary edema secondary to Acute systolic congestive heart failure/Chronic systolic congestive heart failure, NYHA class 3/ Ischemic cardiomyopathy, EF now 10% by cath (05/06/12)  secondary to Acute systolic congestive heart failure/Chronic systolic congestive heart failure, NYHA class 3/ Ischemic cardiomyopathy, EF now 10% by cath 05/06/12 - Pulmonary edema on CXR with 3+ pitting LE edema. Fluid removal challenging last admission due to BP and cardiomyopathy- On coreg.  Continue aspirin.  - Have discussed extensively with her the need to get hospice involved she has agreed. At this time she does not want to be full comfort care. She will like to read me with hospice and palliative care set up a plan.     ESRD on hemodialysis/Hyperkalemic syndrome:  - She was given calcium by the ED physician, have consulted renal for emergent dialysis.    Abdominal pain:  Complex ascites. In the pelvis this appears loculated and is  compressing the sigmoid colon, bladder and displacing small bowel  from the pelvis. , she has remained afebrile, relates no diarrhea, she is exquisitely tender in her left lower quadrant no rebound or guarding.  - Continue her Dilaudid and methadone.  SBP? Will obtain cell count  Will try to consult IR and  see if they can do paracentesis on   loculated ascites     Hypertension:  -Pt with ongoing volume overload and diffiicult with UF on HD due to hypotension On coreg, amiodarone   Pancreatitis, chronic:  - Continue current home dose of narcotics.    ICD (implantable cardiac defibrillator) in place, MDT placed Jan 2013 [facility]  [facility] consult cardiology for further testing.     Anemia of chronic disease:  - Prerenal, monitor hemoglobin as she does have esophageal varices. Check a PT and an INR, her platelets are greater than 150    Consulted Nephrology and cardiology    Code Status: DNR  Family Communication: none  Disposition Plan: inpatient       Brief narrative: Discharged on 5/1 s/p episode of sustained VT requiring emergency cardioversion. Transfer to [facility] for MWF HD in progress. Readmitted for SOB. Abdominal pain and nausea are ongoing due to her pancreatitis. No other complaints   Consultants: Irena Cords, MD  *  Procedures:  None   Antibiotics:  None    HPI/Subjective: Complains of abdominal fullness.      Objective: Filed Vitals:   10/15/12 2313 10/16/12 0147 10/16/12 0537 10/16/12 0820  BP: 108/76  110/64   Pulse: 77 78 78   Temp: 97.8 F (36.6 C)  98.6 F (37 C)   TempSrc: Axillary  Oral   Resp: 20 20 20    Height: 5\' 4"  (1.626 m)     Weight:      SpO2: 96% 97% 94% 94%  Intake/Output Summary (Last 24 hours) at 10/16/12 1029 Last data filed at 10/15/12 2215  Gross per 24 hour  Intake      0 ml  Output   1391 ml  Net  -1391 ml    Exam:  General appearance: alert and no distress  Neck: JVD - 4 cm above sternal notch, no adenopathy, no carotid bruit, supple, symmetrical, trachea midline and thyroid not enlarged, symmetric, no tenderness/mass/nodules  Lungs: clear to auscultation bilaterally  Heart: regular rate and rhythm, S1, S2 normal, no murmur, click, rub or gallop  Abdomen: distended, soft, mild fluid  wave, liver does not appear enlarged  Extremities: extremities normal, atraumatic, no cyanosis or edema  Pulses: 2+ and symmetric  Skin: Skin color, texture, turgor normal. No rashes or lesions  Neurologic: Grossly normal    Data Reviewed: Basic Metabolic Panel:  Recent Labs Lab 10/10/12 0703 10/11/12 0530 10/12/12 0633 10/13/12 0648 10/15/12 0950 10/15/12 1501 10/16/12 0530  NA 133* 133* 129* 134* 135  --  136  K 4.0 3.6 3.6 3.9 6.0*  --  4.0  CL 95* 96 93* 98 97  --  102  CO2 31 28 27 29 26   --  26  GLUCOSE 85 99 92 134* 117*  --  63*  BUN 36* 20 34* 23 36*  --  12  CREATININE 2.50* 1.63* 2.35* 1.56* 1.96* 2.10* 1.04  CALCIUM 9.9 9.1 8.9 9.0 10.4  --  8.9  PHOS 4.1  --  2.9 2.3  --   --   --     Liver Function Tests:  Recent Labs Lab 10/10/12 0703 10/12/12 1610 10/13/12 0648 10/15/12 0950  AST  --   --   --  34  ALT  --   --   --  16  ALKPHOS  --   --   --  244*  BILITOT  --   --   --  0.7  PROT  --   --   --  7.5  ALBUMIN 2.4* 2.4* 2.3* 2.5*   No results found for this basename: LIPASE, AMYLASE,  in the last 168 hours No results found for this basename: AMMONIA,  in the last 168 hours  CBC:  Recent Labs Lab 10/12/12 0635 10/13/12 0600 10/15/12 0950 10/15/12 1501 10/16/12 0530  WBC 9.2 5.7 8.2 8.6 5.6  NEUTROABS  --   --  6.7  --   --   HGB 7.9* 8.0* 11.5* 11.0* 9.6*  HCT 23.8* 24.3* 34.9* 33.1* 29.8*  MCV 89.8 90.0 88.8 88.7 90.0  PLT 125* 105* 183 166 100*    Cardiac Enzymes:  Recent Labs Lab 10/09/12 2105 10/15/12 0926 10/15/12 1501 10/15/12 1811 10/16/12 0212  CKTOTAL  --   --  30  --   --   CKMB  --   --  3.1  --   --   TROPONINI 8.70* 1.22* 1.17* 1.29* 0.93*   BNP (last 3 results)  Recent Labs  10/09/12 0330 10/09/12 0955 10/15/12 0926  PROBNP >70000.0* >70000.0* >70000.0*     CBG:  Recent Labs Lab 10/12/12 2117 10/15/12 1335 10/15/12 2308 10/16/12 0128 10/16/12 0955  GLUCAP 135* 112* 59* 77 80    Recent  Results (from the past 240 hour(s))  MRSA PCR SCREENING     Status: None   Collection Time    10/09/12  1:28 AM      Result Value Range Status   MRSA by PCR NEGATIVE  NEGATIVE Final  Comment:            The GeneXpert MRSA Assay (FDA     approved for NASAL specimens     only), is one component of a     comprehensive MRSA colonization     surveillance program. It is not     intended to diagnose MRSA     infection nor to guide or     monitor treatment for     MRSA infections.     Studies: Ct Abdomen Pelvis W Contrast  10/15/2012  *RADIOLOGY REPORT*  Clinical Data: Lower abdominal pain with nausea and vomiting. Constipation.  Diabetic on dialysis over 1 year.  Prior appendectomy, hysterectomy and cholecystectomy.  CT ABDOMEN AND PELVIS WITH CONTRAST  Technique:  Multidetector CT imaging of the abdomen and pelvis was performed following the standard protocol during bolus administration of intravenous contrast.  Contrast: 80mL OMNIPAQUE IOHEXOL 300 MG/ML  SOLN  Comparison: 08/19/2012.  Findings: AICD is in place.  Marked cardiomegaly.  Diffuse ascites complex with a loculated appearance. In the pelvis, loculated ascites is displacing sigmoid colon posteriorly and small bowel away from the pelvis.  Areas of loculated free air surround the spleen.  This may represent residua of infection previously noted.  Upper left lateral abdominal wall abscess remains measuring 2.5 x 4.4 x 5.5 cm with a loculated septated appearance.  Overall dimensions without significant change.  Abnormal appearance left lobe liver.  This may represent alterations in vascular supply to the liver rather than primary mass.  Small kidneys with calcifications and cysts.  Atherosclerotic type changes aorta with ectasia.  Atherosclerotic type changes iliac arteries with ectasia and narrowing.  Prominent branch vessel atherosclerotic type changes.  Right pleural effusion.  Right base infiltrate/atelectasis. Minimal atelectatic changes  left base.  No obvious pancreatic abnormality.  Hyperplasia adrenal glands.  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  IMPRESSION: Complex ascites.  In the pelvis this appears loculated and is compressing the sigmoid colon, bladder and displacing small bowel from the pelvis.  Areas of loculated free air surround the spleen.  This may represent residua of infection previously noted.  Upper left lateral wall abscess is septated and has overall dimensions relatively similar to the prior exam.  Marked cardiomegaly.  Right pleural effusion.  Right base infiltrate/atelectasis. Minimal atelectatic changes left base.  Significant atherosclerotic type changes.  Renal osteodystrophy changes of the spine.   Original Report Authenticated By: Lacy Duverney, M.D.    Dg Chest Port 1 View  10/15/2012  *RADIOLOGY REPORT*  Clinical Data: Shortness of breath.  History of dialysis and CHF.  PORTABLE CHEST - 1 VIEW  Comparison: 10/08/2012  Findings: Right jugular dialysis catheter tip is in the right atrium.  There is a left cardiac ICD.  Heart remains enlarged for size.  Prominent interstitial densities suggest mild edema or vascular congestion.  Limited evaluation of the right costophrenic angle and cannot exclude a small right pleural effusion.  IMPRESSION: Cardiomegaly with mild interstitial edema or vascular congestion.   Original Report Authenticated By: Richarda Overlie, M.D.    Dg Chest Port 1 View  10/08/2012  *RADIOLOGY REPORT*  Clinical Data: Shortness of breath  PORTABLE CHEST - 1 VIEW  Comparison: 08/19/2012  Findings: Pacing pad are noted over the chest, obscuring detail. Left-sided AICD in place.  Right IJ approach hemodialysis catheter tips over the right atrium.  Moderate enlargement of the cardiomediastinal silhouette again noted with central vascular congestion and hazy perihilar airspace opacity.  Trace right  pleural effusion.  Findings are not significantly changed. Evidence of CABG again noted.  IMPRESSION:  Stable findings of cardiomegaly with hazy pulmonary opacities and right pleural effusion that could indicate pulmonary edema.   Original Report Authenticated By: Christiana Pellant, M.D.     Scheduled Meds: . albuterol  2.5 mg Nebulization TID  . amiodarone  200 mg Oral BID  . aspirin EC  81 mg Oral Daily  . carvedilol  4.6875 mg Oral BID WC  . cinacalcet  60 mg Oral Q breakfast  . dextrose      . dextrose      . docusate sodium  100 mg Oral BID  . feeding supplement (NEPRO CARB STEADY)  237 mL Oral BID WC  . heparin  5,000 Units Subcutaneous Q8H  . ipratropium  0.5 mg Nebulization TID  . methadone  5 mg Oral QID  . multivitamin  1 tablet Oral Daily  . pantoprazole  40 mg Oral Daily  . simvastatin  20 mg Oral q1800  . sodium chloride  3 mL Intravenous Q12H  . sodium chloride  3 mL Intravenous Q12H   Continuous Infusions:   Active Problems:   Ischemic cardiomyopathy, EF 20-25% echo April 2013, now 10% by cath (05/06/12)   Chronic systolic congestive heart failure, NYHA class 3   LBBB (left bundle branch block)   Hypertension   Pancreatitis, chronic   ESRD on hemodialysis   CAD, CABG X 4 Feb '08, LAD stent June '08   ICD (implantable cardiac defibrillator) in place, MDT placed Jan 2013 [facility] [place]   Acute on chronic systolic heart failure   Hx of non-ST elevation myocardial infarction (NSTEMI), 10/10/12   Hyperkalemic syndrome   End of life care    Time spent: 40 minutes   [facility]  [facility] Pager [phone]. If 8PM-8AM, please contact night-coverage at [email or web address], password [facility] 10/16/2012, 10:29 AM  LOS: 1 day

## 2012-10-16 NOTE — Consult Note (Addendum)
Patient WU:[id number] [name]      DOB: 07/11/1950      NWG:[id number]     Consult Note from the Palliative Medicine Team at [facility]    Consult Requested by: Dr Robb Matar     PCP: Jyl Heinz, MD Reason for Consultation:GOC/Symptom Management     Phone Number:[phone]  Assessment of patients Current state: Patient sitting in bedside chair, alert and oriented, admits to abdominal discomfort 8/10 which in not tolerable. States pain was controlled with home medication regime, but has worsened with this admission. Respirations unlabored at 18/min. Family at bedside.  Reviewed chart, spoke with staff caring for patient and proceeded to have Goals of care discussion with patient, her daughter, Alonna Buckler (in from [place]) and patients son, [name] [facility], on phone). We talked about patients cardiac status and current difficulty medically managing her fluid overload due to low blood pressure and overall poor quality heart function. Patient is a retired Designer, jewellery and is familiar with extent of her medical issues, and understands overall poor outcome, despite aggressive medical intervention.   We talked in depth about resuscitation measures and the overall likelihood of poor survival ability related to her chronic progressive conditions. Talked about renal recommendations for HD to be increased to 4 times per week to aide in diuresis, patient stated she would agree with this plan if it would help.  Discussed decision for partial code interventions, with knowledge that outside acute care setting decision can only be full measures or don not resuscitate.  We also discussed what resuscitation (compressions, cardioversion, mechanical ventilation, and the use of vasopressors/antiarrythmmics) would entail and the risk and burdens. Also discussed in the event if she was unable to survive without life support the fact that her children would have to make the decision for withdrawal of  aggressive support.  Discussed the philosophy of palliative care and hospice support and services provided. Also engaged in decision-making with patient/family regarding concepts of Medical Orders for Scope of Treatment pertaining to cardiac and pulmonary resuscitation, desire for acute future medical interventions for issues, the use of antibiotic therapy, intravenous hydration and continuing artificial tube feedings. Patient emphasized she is not ready to transition to full comfort or hospice care at this time. Candidly discussed with patient and children that there will be a point when medical interventions including HD may not be a viable option and decision for hospice may be the best option. They verbalized understanding and appreciated discussion with palliative care. Dr Susie Cassette made aware of decision.  Goals of Care: 1.  Code Status: Full  Would want full resuscitation attempt, including cardioversion, antiarrythmic and ventilatory pulmonary support   2. Scope of Treatment: 1. Vital Signs:routine 2. AICD: continue  3. Respiratory/Oxygen:support as indicated 4. Nutritional Support/Tube Feeds: No artificial tube feedings 5. Antibiotics:yes for identified infections 6. Blood Products: yes as indicated 7. IVF: for defined period of time 8. Review of Medications to be discontinued: none 9. Labs: as indicated 10. Telemetry: yes   4. Disposition: pending, most likely disposition to SNF   3. Symptom Management: 1. Pain: Methadone 5 mg oral four times daily, Dilaudid 1 mg IV every 2 hours as needed ( 24 hr total use: 3 mg)-discontinued as needed oral dosing for continuity. Convert IV dosing to oral as needed prior to discharge  2. Bowel Regimen:Miralax daily as needed, scheduled Colace 100 mg twice daily  4. Psychosocial: Educated patient and family on current medical issues and interventions being done, help them  in understanding risk/burdens related to goals of care decisions  5.  Spiritual: declined intervention at this time  Patient Documents Completed or Given: Document Given Completed  Advanced Directives Pkt    MOST  YES  DNR    Gone from My Sight    Hard Choices YES     Brief HPI: Patient is 62 yo AAF with multiple co-morbidities including CAD (s/p CABG), Cardiomyopathy (s/p AICD), CHF (EF 10% via cath 05/06/12), liver cirrhosis, ESRD on HD for past 2 years, chronic pancreatitis with abdominal pseudocyst. On 10/08/12 presented to ER with symptomatic tachycardia -> cadioverted with 50 joules in ED->reverted to NSR admitted for observation. Readmitted 10/16/11 for SOB found to have pulmonary edema 2 ndary to acute CHF, diuresis difficult due to hypotension, also increased ascited.   ROS: + abdominal pain, + abdominal distention, denies SOB, nausea/vomiting, constipation, decreased appetite    PMH:  Past Medical History  Diagnosis Date  . Coronary artery disease     s/p CABG 2008 with multiple PCIs  . Ischemic cardiomyopathy     Cath 04/2012, significant calcifications  . History of nonadherence to medical treatment   . Systolic CHF     29/5621 echo EF 10%  . Esophageal varices   . Cirrhosis     hx of fatty liver per patient, has known ascites with repeated paracenteses at [facility] as of 2013, also +hx of esoph varices with banding in the past  . Pacemaker   . ICD (implantable cardiac defibrillator) in place   . Hypertension     "used to have HTN; now I'm low" (05/23/2012)  . NSTEMI (non-ST elevated myocardial infarction)     04/2012; Trop peaked to 1.39; 05/23/2012 pt denies ever having MI  . Pneumonia 02/2012    "first time ever" (05/23/2012)  . Chronic bronchitis 1970's thru 2002    "went away when I stopped smoking in 2002" (05/23/2012)  . Diabetes mellitus     Diet controlled  . History of blood transfusion     "alot of them" (05/23/2012)  . Iron (Fe) deficiency anemia     "severe" (05/23/2012)  . Pancreatitis, chronic 10/04/2011    Had severe  gallstone pancreatitis in May 2003, no surgery, treated medically then returned in June 2003 for cholecystectomy and cyst gastrostomy for pancreatic pseudocyst. In March 2013 was admitted for hemorrhage into psueodcyst. In April 2013 was treated at [facility] for polymicrobial bacteremia (fungal, MRSA and enterobacter) felt to be due to ruptured pseudocyst, treated medically. Admitted Jan 2014 with abd pain and had gas and fluid in pancreatic bed. She refused surgery ("would not make it") and had fluid aspirated by IR- gram stain showed yeast and cultures grew enterococcus species, sensitive to amp and vanc. She was seen by ID and discharged on IV vanc/fortaz with HD and po voriconazole.     [name] ESRD on hemodialysis 05/06/2012    T,Th,Sat HD by [facility] on [address]. Started hemodialysis around July 2013.    [name] CHF (congestive heart failure)   . Complication of vascular access for dialysis 07/13/2012    L arm AVG (placed by Dr. Lyda Perone @ [facility]25) was ligated 1/6 after significant L arm swelling secondary to L SCV stenosis and pacemaker; new access to be placed at right arm on 1/29 by Dr. Hollace Hayward    . NSTEMI (non-ST elevated myocardial infarction) 10/08/12    cardiac cath with no obvious acute lesion, medical therapy     PSH: Past Surgical History  Procedure Laterality Date  . Esophagogastroduodenoscopy  09/15/2011    Procedure: ESOPHAGOGASTRODUODENOSCOPY (EGD);  Surgeon: Theda Belfast, MD;  Location: [facility] ENDOSCOPY;  Service: Endoscopy;  Laterality: N/A;  . Cholecystectomy  2003  . Tonsillectomy and adenoidectomy  1961  . Appendectomy  1973?  [name] Vaginal hysterectomy  1973?  [name] Tubal ligation  1972  . Dilation and curettage of uterus      "bunch from profuse bleeding in the 1970's" (05/23/2012)  . Coronary artery bypass graft  2008    CABG X4  . Coronary angioplasty with stent placement  2008    "1; day after CABG" (05/23/2012)  . Cardiac catheterization      "before 2008 and 3 wk ago"  (05/23/2012)  . Insert / replace / remove pacemaker  06/2011    pacemaker ICD  . Cardiac defibrillator placement  06/2011  . Arteriovenous graft placement  03/2012    right antecub  . Insertion of dialysis catheter  ~ 10/2011    right chest  . Peritoneal catheter insertion  08/2011  . Peritoneal catheter removal  ~ 10/2011   I have reviewed the FH and SH and  If appropriate update it with new information. Allergies  Allergen Reactions  . Codeine Itching  . Morphine And Related Shortness Of Breath    SOB when given IV once "to me it was mild; I called out fast for help; never had to intubate" (05/23/2012)  . Ace Inhibitors Other (See Comments)    Unknown reaction but her physician stated that she can't take it (specifically lisinopril)  . Tylenol (Acetaminophen) Other (See Comments)    Patient states that the doctor told her she has a Fatty Liver.   Scheduled Meds: . albuterol  2.5 mg Nebulization TID  . amiodarone  200 mg Oral BID  . aspirin EC  81 mg Oral Daily  . carvedilol  4.6875 mg Oral BID WC  . cinacalcet  60 mg Oral Q breakfast  . [START ON 10/17/2012] darbepoetin (ARANESP) injection - DIALYSIS  200 mcg Intravenous Q Mon-HD  . docusate sodium  100 mg Oral BID  . feeding supplement (NEPRO CARB STEADY)  237 mL Oral BID WC  . heparin  5,000 Units Subcutaneous Q8H  . ipratropium  0.5 mg Nebulization TID  . methadone  5 mg Oral QID  . multivitamin  1 tablet Oral Daily  . pantoprazole  40 mg Oral Daily  . simvastatin  20 mg Oral q1800  . sodium chloride  3 mL Intravenous Q12H  . sodium chloride  3 mL Intravenous Q12H   Continuous Infusions:  PRN Meds:.sodium chloride, albuterol, HYDROmorphone (DILAUDID) injection, nitroGLYCERIN, ondansetron (ZOFRAN) IV, ondansetron, polyethylene glycol, sodium chloride    BP 126/82  Pulse 67  Temp(Src) 97.8 F (36.6 C) (Oral)  Resp 17  Ht 5\' 4"  (1.626 m)  Wt 44.9 kg (98 lb 15.8 oz)  BMI 16.98 kg/m2  SpO2 93%   PPS:  50%   Intake/Output Summary (Last 24 hours) at 10/16/12 1713 Last data filed at 10/15/12 2215  Gross per 24 hour  Intake      0 ml  Output   1391 ml  Net  -1391 ml   LBM:10/16/12                        Physical Exam:  General: alert, thin cachectic, ill appearing  HEENT:  Anicteric, buccal mucosa moist Chest:  CTA bilaterally CVS: RRR Abdomen:distended, mild pain URQ  and ULQ with palpation, BS audible Ext: 2-3 + pitting firm, edema Neuro:oriented x 3  Labs: CBC    Component Value Date/Time   WBC 5.6 10/16/2012 0530   WBC 6.7 04/08/2011 1054   RBC 3.31* 10/16/2012 0530   RBC 3.40* 04/08/2011 1054   HGB 9.6* 10/16/2012 0530   HGB 10.1* 04/08/2011 1054   HCT 29.8* 10/16/2012 0530   HCT 30.1* 04/08/2011 1054   PLT 100* 10/16/2012 0530   PLT 232 04/08/2011 1054   MCV 90.0 10/16/2012 0530   MCV 88.6 04/08/2011 1054   MCH 29.0 10/16/2012 0530   MCH 29.7 04/08/2011 1054   MCHC 32.2 10/16/2012 0530   MCHC 33.5 04/08/2011 1054   RDW 17.5* 10/16/2012 0530   RDW 20.4* 04/08/2011 1054   LYMPHSABS 1.0 10/15/2012 0950   LYMPHSABS 1.4 04/08/2011 1054   MONOABS 0.5 10/15/2012 0950   MONOABS 0.4 04/08/2011 1054   EOSABS 0.0 10/15/2012 0950   EOSABS 0.2 04/08/2011 1054   BASOSABS 0.0 10/15/2012 0950   BASOSABS 0.0 04/08/2011 1054    BMET    Component Value Date/Time   NA 136 10/16/2012 0530   K 4.0 10/16/2012 0530   CL 102 10/16/2012 0530   CO2 26 10/16/2012 0530   GLUCOSE 63* 10/16/2012 0530   BUN 12 10/16/2012 0530   CREATININE 1.04 10/16/2012 0530   CALCIUM 8.9 10/16/2012 0530   GFRNONAA 56* 10/16/2012 0530   GFRAA 65* 10/16/2012 0530    CMP     Component Value Date/Time   NA 136 10/16/2012 0530   K 4.0 10/16/2012 0530   CL 102 10/16/2012 0530   CO2 26 10/16/2012 0530   GLUCOSE 63* 10/16/2012 0530   BUN 12 10/16/2012 0530   CREATININE 1.04 10/16/2012 0530   CALCIUM 8.9 10/16/2012 0530   PROT 7.5 10/15/2012 0950   ALBUMIN 2.5* 10/15/2012 0950   AST 34 10/15/2012 0950   ALT 16 10/15/2012 0950   ALKPHOS 244* 10/15/2012 0950    BILITOT 0.7 10/15/2012 0950   GFRNONAA 56* 10/16/2012 0530   GFRAA 65* 10/16/2012 0530    Time In Time Out Total Time Spent with Patient Total Overall Time  3:00p 4:30p 90 min 100 min    Greater than 50%  of this time was spent counseling and coordinating care related to the above assessment and plan.  Freddie Breech, CNS-C Palliative Medicine Team [facility] Team Phone: [phone] Pager: [phone]

## 2012-10-17 ENCOUNTER — Inpatient Hospital Stay (HOSPITAL_COMMUNITY): Payer: Non-veteran care

## 2012-10-17 LAB — BODY FLUID CELL COUNT WITH DIFFERENTIAL
Lymphs, Fluid: 9 %
Neutrophil Count, Fluid: 0 % (ref 0–25)
Total Nucleated Cell Count, Fluid: 259 cu mm (ref 0–1000)

## 2012-10-17 MED ORDER — HYDROMORPHONE HCL PF 1 MG/ML IJ SOLN
INTRAMUSCULAR | Status: AC
  Administered 2012-10-17: 1 mg via INTRAVENOUS
  Filled 2012-10-17: qty 1

## 2012-10-17 MED ORDER — HYDROMORPHONE HCL 2 MG PO TABS
4.0000 mg | ORAL_TABLET | ORAL | Status: DC | PRN
  Administered 2012-10-17: 4 mg via ORAL
  Filled 2012-10-17: qty 2

## 2012-10-17 MED ORDER — DARBEPOETIN ALFA-POLYSORBATE 200 MCG/0.4ML IJ SOLN
INTRAMUSCULAR | Status: AC
  Administered 2012-10-17: 200 ug via INTRAVENOUS
  Filled 2012-10-17: qty 0.4

## 2012-10-17 MED ORDER — NEPRO/CARBSTEADY PO LIQD
237.0000 mL | Freq: Three times a day (TID) | ORAL | Status: DC
  Administered 2012-10-17 – 2012-10-18 (×4): 237 mL via ORAL
  Filled 2012-10-17 (×2): qty 237

## 2012-10-17 NOTE — Progress Notes (Signed)
Patient WU:[id number] A Demeyer      DOB: 1951-02-21      NWG:[id number]   Palliative Medicine Team at [facility] Progress Note    Subjective:Patient sitting in bedside chair, appears comfortable, denies SOB, respirations unlabored. Feels breathing and ankle edema are improved after having dialysis. We talked about home support, daughter and other family will be at patients home upon discharge. Abdominal pain 7-8/10 presently.   Filed Vitals:   10/17/12 1249  BP: 95/40  Pulse: 66  Temp: 97.6 F (36.4 C)  Resp: 19   Physical exam: General: Alert, thin cachetic HEENT: anicteric, buccal mucosa moist CHEST: scattered fine rhonchi CVS: RRR ABD: diffuse upper abdominal pain upon palpation, BS audible (BM 5/5) EXT: 1-2+ pedal edema (pitting)  Assessment and plan: Patient is 62 yo AAF with multiple co-morbidities including CAD (s/p CABG), Cardiomyopathy (s/p AICD), CHF (EF 10% via cath 05/06/12), liver cirrhosis, ESRD on HD for past 2 years, chronic pancreatitis with abdominal pseudocyst. On 10/08/12 presented to ER with symptomatic tachycardia -> cadioverted with 50 joules in ED->reverted to NSR admitted for observation. Readmitted 10/16/11 for SOB found to have pulmonary edema 2 ndary to acute CHF, diuresis difficult due to hypotension, also increased ascited. Patient for paracentesis this afternoon.  Code Status: Full Symptom Management:  1. Pain: Methadone 5 mg oral four times daily, Dilaudid 1 mg IV every 2 hours as needed ( 24 hr total: 4 mg IV = 20 mg oral). Ordered Dilaudid 4 mg every 4 hours as needed. Educated patient on dose change-she is agreeable, nursing staff also aware. Plan is to contact pain clinic at [facility] for refills after discharge-will need prescriptions for Methadone and Dilaudid at discharge to carry her through intial appointment at [facility]  2. Bowel Regimen:Miralax daily as needed, scheduled Colace 100 mg twice daily   Time In Time Out Total Time Spent with Patient  Total Overall Time  2:15p 2:45p 30 min 30 min    Freddie Breech, CNS-C Palliative Medicine Team [facility] Team Phone: [phone] Pager: [phone]

## 2012-10-17 NOTE — Progress Notes (Addendum)
[facility] PROGRESS NOTE  [name]:[id number] DOB: [birth year] DOA: 10/15/2012 PCP: Jyl Heinz, MD  Assessment/Plan: Active Problems:   Ischemic cardiomyopathy, EF 20-25% echo April 2013, now 10% by cath (05/06/12)   Chronic systolic congestive heart failure, NYHA class 3   LBBB (left bundle branch block)   Hypertension   Pancreatitis, chronic   ESRD on hemodialysis   CAD, CABG X 4 Feb '08, LAD stent June '08   ICD (implantable cardiac defibrillator) in place, MDT placed Jan 2013 [facility] [place]   Acute on chronic systolic heart failure   Hx of non-ST elevation myocardial infarction (NSTEMI), 10/10/12   Hyperkalemic syndrome   End of life care   Severe protein-calorie malnutrition   Cardiac cachexia   Palliative care encounter    Pulmonary edema secondary to Acute systolic congestive heart failure/Chronic systolic congestive heart failure, NYHA class 3/ Ischemic cardiomyopathy, EF now 10% by cath (05/06/12)  secondary to Acute systolic congestive heart failure/Chronic systolic congestive heart failure, NYHA class 3/ Ischemic cardiomyopathy, EF now 10% by cath 05/06/12 - Pulmonary edema on CXR with 3+ pitting LE edema. Fluid removal challenging last admission due to BP and cardiomyopathy- On coreg.  Continue aspirin.  - Have discussed extensively with her the need to get hospice involved , palliative care consultation completed, the patient is a full code currently and wants all medical intervention.  s/p cardioversion on 5/1, Cardiac cath and ICD reprogrammed. Troponin positive but decreasing, EF of 10%; on amiodarone, low dose coreg bid.    ESRD on hemodialysis/Hyperkalemic syndrome:  - She was given calcium by the ED physician, have consulted renal for emergent dialysis.    Abdominal pain:  Complex ascites. In the pelvis this appears loculated and is  compressing the sigmoid colon, bladder and displacing small bowel  from the pelvis.  , she has remained  afebrile, relates no diarrhea, she is exquisitely tender in her left lower quadrant no rebound or guarding.  - Continue her Dilaudid and methadone.  SBP? Will obtain cell count  Will try to consult IR and see if they can do paracentesis on loculated ascites      Hypertension:  -Pt with ongoing volume overload and diffiicult with UF on HD due to hypotension  On coreg, amiodarone  Consider Midodrine pre-hd. She will need 4 time a week and needs to have this in place prior to discharge to avoid rehosp   Pancreatitis, chronic:  - Continue current home dose of narcotics.    ICD (implantable cardiac defibrillator) in place, MDT placed Jan 2013 [facility] consult cardiology for further testing.   Anemia of chronic disease:  - Prerenal, monitor hemoglobin as she does have esophageal varices. Check a PT and an INR, her platelets are greater than 150    Consulted Nephrology and cardiology  Code Status: full code  Family Communication: none  Disposition Plan: inpatient    Brief narrative:  Discharged on 5/1 s/p episode of sustained VT requiring emergency cardioversion. Transfer to [facility] for MWF HD in progress. Readmitted for SOB. Abdominal pain and nausea are ongoing due to her pancreatitis. No other complaints    Consultants:  Irena Cords, MD  * Procedures:  None  Antibiotics:  None  HPI/Subjective:  Complains of abdominal fullness. Awaiting paracentesis      Objective: Filed Vitals:   10/17/12 0845 10/17/12 0900 10/17/12 0930 10/17/12 1000  BP: 108/68 106/66 103/61 85/47  Pulse: 62 60 64 62  Temp:  TempSrc:      Resp: 16 20 23 15   Height:      Weight:      SpO2:       No intake or output data in the 24 hours ending 10/17/12 1015  Exam:General appearance: alert and no distress  Neck: JVD - 4 cm above sternal notch, no adenopathy, no carotid bruit, supple, symmetrical, trachea midline and thyroid not enlarged, symmetric, no  tenderness/mass/nodules  Lungs: clear to auscultation bilaterally  Heart: regular rate and rhythm, S1, S2 normal, no murmur, click, rub or gallop  Abdomen: distended, soft, mild fluid wave, liver does not appear enlarged  Extremities: extremities normal, atraumatic, no cyanosis or edema  Pulses: 2+ and symmetric  Skin: Skin color, texture, turgor normal. No rashes or lesions  Neurologic: Grossly normal       Data Reviewed: Basic Metabolic Panel:  Recent Labs Lab 10/11/12 0530 10/12/12 0633 10/13/12 0648 10/15/12 0950 10/15/12 1501 10/16/12 0530  NA 133* 129* 134* 135  --  136  K 3.6 3.6 3.9 6.0*  --  4.0  CL 96 93* 98 97  --  102  CO2 28 27 29 26   --  26  GLUCOSE 99 92 134* 117*  --  63*  BUN 20 34* 23 36*  --  12  CREATININE 1.63* 2.35* 1.56* 1.96* 2.10* 1.04  CALCIUM 9.1 8.9 9.0 10.4  --  8.9  PHOS  --  2.9 2.3  --   --   --     Liver Function Tests:  Recent Labs Lab 10/12/12 0633 10/13/12 0648 10/15/12 0950  AST  --   --  34  ALT  --   --  16  ALKPHOS  --   --  244*  BILITOT  --   --  0.7  PROT  --   --  7.5  ALBUMIN 2.4* 2.3* 2.5*   No results found for this basename: LIPASE, AMYLASE,  in the last 168 hours No results found for this basename: AMMONIA,  in the last 168 hours  CBC:  Recent Labs Lab 10/12/12 0635 10/13/12 0600 10/15/12 0950 10/15/12 1501 10/16/12 0530  WBC 9.2 5.7 8.2 8.6 5.6  NEUTROABS  --   --  6.7  --   --   HGB 7.9* 8.0* 11.5* 11.0* 9.6*  HCT 23.8* 24.3* 34.9* 33.1* 29.8*  MCV 89.8 90.0 88.8 88.7 90.0  PLT 125* 105* 183 166 100*    Cardiac Enzymes:  Recent Labs Lab 10/15/12 0926 10/15/12 1501 10/15/12 1811 10/16/12 0212  CKTOTAL  --  30  --   --   CKMB  --  3.1  --   --   TROPONINI 1.22* 1.17* 1.29* 0.93*   BNP (last 3 results)  Recent Labs  10/09/12 0330 10/09/12 0955 10/15/12 0926  PROBNP >70000.0* >70000.0* >70000.0*     CBG:  Recent Labs Lab 10/16/12 0906 10/16/12 0955 10/16/12 1152  10/16/12 1708 10/16/12 2225  GLUCAP 61* 80 135* 70 93    Recent Results (from the past 240 hour(s))  MRSA PCR SCREENING     Status: None   Collection Time    10/09/12  1:28 AM      Result Value Range Status   MRSA by PCR NEGATIVE  NEGATIVE Final   Comment:            The GeneXpert MRSA Assay (FDA     approved for NASAL specimens     only), is one component of  a     comprehensive MRSA colonization     surveillance program. It is not     intended to diagnose MRSA     infection nor to guide or     monitor treatment for     MRSA infections.     Studies: Ct Abdomen Pelvis W Contrast  10/15/2012  *RADIOLOGY REPORT*  Clinical Data: Lower abdominal pain with nausea and vomiting. Constipation.  Diabetic on dialysis over 1 year.  Prior appendectomy, hysterectomy and cholecystectomy.  CT ABDOMEN AND PELVIS WITH CONTRAST  Technique:  Multidetector CT imaging of the abdomen and pelvis was performed following the standard protocol during bolus administration of intravenous contrast.  Contrast: 80mL OMNIPAQUE IOHEXOL 300 MG/ML  SOLN  Comparison: 08/19/2012.  Findings: AICD is in place.  Marked cardiomegaly.  Diffuse ascites complex with a loculated appearance. In the pelvis, loculated ascites is displacing sigmoid colon posteriorly and small bowel away from the pelvis.  Areas of loculated free air surround the spleen.  This may represent residua of infection previously noted.  Upper left lateral abdominal wall abscess remains measuring 2.5 x 4.4 x 5.5 cm with a loculated septated appearance.  Overall dimensions without significant change.  Abnormal appearance left lobe liver.  This may represent alterations in vascular supply to the liver rather than primary mass.  Small kidneys with calcifications and cysts.  Atherosclerotic type changes aorta with ectasia.  Atherosclerotic type changes iliac arteries with ectasia and narrowing.  Prominent branch vessel atherosclerotic type changes.  Right pleural  effusion.  Right base infiltrate/atelectasis. Minimal atelectatic changes left base.  No obvious pancreatic abnormality.  Hyperplasia adrenal glands.  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  IMPRESSION: Complex ascites.  In the pelvis this appears loculated and is compressing the sigmoid colon, bladder and displacing small bowel from the pelvis.  Areas of loculated free air surround the spleen.  This may represent residua of infection previously noted.  Upper left lateral wall abscess is septated and has overall dimensions relatively similar to the prior exam.  Marked cardiomegaly.  Right pleural effusion.  Right base infiltrate/atelectasis. Minimal atelectatic changes left base.  Significant atherosclerotic type changes.  Renal osteodystrophy changes of the spine.   Original Report Authenticated By: Lacy Duverney, M.D.    Dg Chest Port 1 View  10/16/2012  *RADIOLOGY REPORT*  Clinical Data: Pulmonary edema post hemodialysis.  PORTABLE CHEST - 1 VIEW  Comparison: 10/15/2012.  Findings: Right central line tip right atrium level.  Skin folds on the right suspected without pneumothorax.  Attention to this possibility follow-up.  AICD / pacemaker enters from the left.  Leads unchanged in position.  Post CABG.  Cardiomegaly.  Pulmonary edema.  Basilar infiltrate or right not excluded.  IMPRESSION: Cardiomegaly of pulmonary edema.  Right base infiltrate not excluded.  Prominent skin folds on the right suspected.  Please see above.   Original Report Authenticated By: Lacy Duverney, M.D.    Dg Chest Port 1 View  10/15/2012  *RADIOLOGY REPORT*  Clinical Data: Shortness of breath.  History of dialysis and CHF.  PORTABLE CHEST - 1 VIEW  Comparison: 10/08/2012  Findings: Right jugular dialysis catheter tip is in the right atrium.  There is a left cardiac ICD.  Heart remains enlarged for size.  Prominent interstitial densities suggest mild edema or vascular congestion.  Limited evaluation of the right costophrenic  angle and cannot exclude a small right pleural effusion.  IMPRESSION: Cardiomegaly with mild interstitial edema or vascular congestion.   Original  Report Authenticated By: Richarda Overlie, M.D.    Dg Chest Port 1 View  10/08/2012  *RADIOLOGY REPORT*  Clinical Data: Shortness of breath  PORTABLE CHEST - 1 VIEW  Comparison: 08/19/2012  Findings: Pacing pad are noted over the chest, obscuring detail. Left-sided AICD in place.  Right IJ approach hemodialysis catheter tips over the right atrium.  Moderate enlargement of the cardiomediastinal silhouette again noted with central vascular congestion and hazy perihilar airspace opacity.  Trace right pleural effusion.  Findings are not significantly changed. Evidence of CABG again noted.  IMPRESSION: Stable findings of cardiomegaly with hazy pulmonary opacities and right pleural effusion that could indicate pulmonary edema.   Original Report Authenticated By: Christiana Pellant, M.D.     Scheduled Meds: . albuterol  2.5 mg Nebulization TID  . amiodarone  200 mg Oral BID  . aspirin EC  81 mg Oral Daily  . carvedilol  4.6875 mg Oral BID WC  . cinacalcet  60 mg Oral Q breakfast  . darbepoetin (ARANESP) injection - DIALYSIS  200 mcg Intravenous Q Mon-HD  . docusate sodium  100 mg Oral BID  . feeding supplement (NEPRO CARB STEADY)  237 mL Oral BID WC  . heparin  5,000 Units Subcutaneous Q8H  . ipratropium  0.5 mg Nebulization TID  . methadone  5 mg Oral QID  . multivitamin  1 tablet Oral Daily  . pantoprazole  40 mg Oral Daily  . simvastatin  20 mg Oral q1800  . sodium chloride  3 mL Intravenous Q12H  . sodium chloride  3 mL Intravenous Q12H   Continuous Infusions:   Active Problems:   Ischemic cardiomyopathy, EF 20-25% echo April 2013, now 10% by cath (05/06/12)   Chronic systolic congestive heart failure, NYHA class 3   LBBB (left bundle branch block)   Hypertension   Pancreatitis, chronic   ESRD on hemodialysis   CAD, CABG X 4 Feb '08, LAD stent June  '08   ICD (implantable cardiac defibrillator) in place, MDT placed Jan 2013 [facility] [place]   Acute on chronic systolic heart failure   Hx of non-ST elevation myocardial infarction (NSTEMI), 10/10/12   Hyperkalemic syndrome   End of life care   Severe protein-calorie malnutrition   Cardiac cachexia   Palliative care encounter    Time spent: 40 minutes   [facility]  [facility] Pager [phone]. If 8PM-8AM, please contact night-coverage at [email or web address], password [facility] 10/17/2012, 10:15 AM  LOS: 2 days

## 2012-10-17 NOTE — Procedures (Signed)
I was present at this dialysis session. I have reviewed the session itself and made appropriate changes.   Vinson Moselle, MD [employer] 10/17/2012, 11:45 AM

## 2012-10-17 NOTE — Progress Notes (Signed)
[facility] Progress Note  Subjective:  In better spirits, on dialysis, no complaints  Objective Filed Vitals:   10/17/12 0930 10/17/12 1000 10/17/12 1030 10/17/12 1100  BP: 103/61 85/47 96/58  100/62  Pulse: 64 62 62 67  Temp:      TempSrc:      Resp: 23 15 17 19   Height:      Weight:      SpO2:       Physical Exam General: Frail, NAD Heart: RRR, ICD left chest Lungs: bibasilar crackles Abdomen: Distended, non-tender, normal BS Extremities: 2+ bilateral LE pitting edema Dialysis Access: Rt I-J  Outpatient HD: MWF @ [facility] - Was to start there 5/5. (previously at [facility])  4 hrs  4K/2.5Ca bath  45.5kg (needs titration due to freq hospitalizations) Tight Heparin. Epo 28K No IV Fe or vit D (needs updated labs) R IJ cath  Assessment/Plan: 1. Pulmonary edema - per admit, secondary to Acute systolic congestive heart failure/Chronic systolic congestive heart failure, NYHA class 3/ Ischemic cardiomyopathy, EF now 10% by cath 05/06/12. Fluid removal challenging last admission due to BP and cardiomyopathy- on coreg. Plan is to increase to 4x/week dialysis to try and deal with recurrent problems of volume overload, we need to clear with outpt unit 2. Hyperkalemia - improved 3. ESRD, usual HD TTS- changing to [facility], need with d/w them about 4x/week schedule and if that is possible 4. Abdominal Pain/ Chronic Pancreatitis - CT shows complex ascites in pelvis, loculated and is compressing sigmoid colon, bladder, and displacing small bowel. Considering paracentesis. Continue dilaudid and methadone per primary. 5. HTN/Volume - SBPs stable in 110s- Post wgt 44.9 with net UF 1391cc. BP limiting fluid removal. Ascites contributing to her wgt. Will repeat CXR. Pt is willing to consider 4 x week HD.  Consider Midodrine pre-hd 6. SVT/ NSTEMI - s/p cardioversion on 5/1, Cardiac cath and ICD reprogrammed. Troponin positive but decreasing, EF of 10%; on amiodarone, low dose coreg bid.   7. Anemia - Hgb 9.6 <11.5 > 7.9 s/p max Aranesp and transfusion last admission. Tsat 28%. Ferritin 5039 - ? Inflammation vs lab error. Resume max Aranesp. 8. Secondary hyperparathyroidism- Ca 8.9(10.1 corrected) P 2.3 - Renvela d/c'd last admission. Continue sensipar. No op hectorol. Recheck PTH. 9. Nutrition - Alb 2.5; NPO for now. Will allow heart healthy regular diet with fluid restriction.Nepro. Multivitamin 10. Dispo - Pt aware of poor prognosis. She wants limited code (amenable to cardioversion and antiarrythmics.) Does not want full comfort care at this time.  Dorothy Moselle  MD [phone] pgr    [phone] cell 10/17/2012, 11:43 AM       Additional Objective Labs: Basic Metabolic Panel:  Recent Labs Lab 10/12/12 0633 10/13/12 0648 10/15/12 0950 10/15/12 1501 10/16/12 0530  NA 129* 134* 135  --  136  K 3.6 3.9 6.0*  --  4.0  CL 93* 98 97  --  102  CO2 27 29 26   --  26  GLUCOSE 92 134* 117*  --  63*  BUN 34* 23 36*  --  12  CREATININE 2.35* 1.56* 1.96* 2.10* 1.04  CALCIUM 8.9 9.0 10.4  --  8.9  PHOS 2.9 2.3  --   --   --    Liver Function Tests:  Recent Labs Lab 10/12/12 0633 10/13/12 0648 10/15/12 0950  AST  --   --  34  ALT  --   --  16  ALKPHOS  --   --  244*  BILITOT  --   --  0.7  PROT  --   --  7.5  ALBUMIN 2.4* 2.3* 2.5*   No results found for this basename: LIPASE, AMYLASE,  in the last 168 hours CBC:  Recent Labs Lab 10/12/12 0635 10/13/12 0600 10/15/12 0950 10/15/12 1501 10/16/12 0530  WBC 9.2 5.7 8.2 8.6 5.6  NEUTROABS  --   --  6.7  --   --   HGB 7.9* 8.0* 11.5* 11.0* 9.6*  HCT 23.8* 24.3* 34.9* 33.1* 29.8*  MCV 89.8 90.0 88.8 88.7 90.0  PLT 125* 105* 183 166 100*   Blood Culture    Component Value Date/Time   SDES BLOOD LEFT HAND 08/20/2012 2200   SPECREQUEST BOTTLES DRAWN AEROBIC AND ANAEROBIC 10CC EACH 08/20/2012 2200   CULT NO GROWTH 5 DAYS 08/20/2012 2200   REPTSTATUS 08/29/2012 FINAL 08/20/2012 2200    Cardiac Enzymes:  Recent  Labs Lab 10/15/12 0926 10/15/12 1501 10/15/12 1811 10/16/12 0212  CKTOTAL  --  30  --   --   CKMB  --  3.1  --   --   TROPONINI 1.22* 1.17* 1.29* 0.93*   CBG:  Recent Labs Lab 10/16/12 0906 10/16/12 0955 10/16/12 1152 10/16/12 1708 10/16/12 2225  GLUCAP 61* 80 135* 70 93   Iron Studies:   Recent Labs  10/15/12 1501  IRON 45  TIBC 160*  FERRITIN 5039*   @[email or web address] @ Studies/Results: Ct Abdomen Pelvis W Contrast  10/15/2012  *RADIOLOGY REPORT*  Clinical Data: Lower abdominal pain with nausea and vomiting. Constipation.  Diabetic on dialysis over 1 year.  Prior appendectomy, hysterectomy and cholecystectomy.  CT ABDOMEN AND PELVIS WITH CONTRAST  Technique:  Multidetector CT imaging of the abdomen and pelvis was performed following the standard protocol during bolus administration of intravenous contrast.  Contrast: 80mL OMNIPAQUE IOHEXOL 300 MG/ML  SOLN  Comparison: 08/19/2012.  Findings: AICD is in place.  Marked cardiomegaly.  Diffuse ascites complex with a loculated appearance. In the pelvis, loculated ascites is displacing sigmoid colon posteriorly and small bowel away from the pelvis.  Areas of loculated free air surround the spleen.  This may represent residua of infection previously noted.  Upper left lateral abdominal wall abscess remains measuring 2.5 x 4.4 x 5.5 cm with a loculated septated appearance.  Overall dimensions without significant change.  Abnormal appearance left lobe liver.  This may represent alterations in vascular supply to the liver rather than primary mass.  Small kidneys with calcifications and cysts.  Atherosclerotic type changes aorta with ectasia.  Atherosclerotic type changes iliac arteries with ectasia and narrowing.  Prominent branch vessel atherosclerotic type changes.  Right pleural effusion.  Right base infiltrate/atelectasis. Minimal atelectatic changes left base.  No obvious pancreatic abnormality.  Hyperplasia adrenal glands.  Renal  osteodystrophy type changes of the spine.  IMPRESSION: Complex ascites.  In the pelvis this appears loculated and is compressing the sigmoid colon, bladder and displacing small bowel from the pelvis.  Areas of loculated free air surround the spleen.  This may represent residua of infection previously noted.  Upper left lateral wall abscess is septated and has overall dimensions relatively similar to the prior exam.  Marked cardiomegaly.  Right pleural effusion.  Right base infiltrate/atelectasis. Minimal atelectatic changes left base.  Significant atherosclerotic type changes.  Renal osteodystrophy changes of the spine.   Original Report Authenticated By: Lacy Duverney, M.D.    Dg Chest Port 1 View  10/16/2012  *RADIOLOGY REPORT*  Clinical Data: Pulmonary edema post hemodialysis.  PORTABLE CHEST - 1 VIEW  Comparison: 10/15/2012.  Findings: Right central line tip right atrium level.  Skin folds on the right suspected without pneumothorax.  Attention to this possibility follow-up.  AICD / pacemaker enters from the left.  Leads unchanged in position.  Post CABG.  Cardiomegaly.  Pulmonary edema.  Basilar infiltrate or right not excluded.  IMPRESSION: Cardiomegaly of pulmonary edema.  Right base infiltrate not excluded.  Prominent skin folds on the right suspected.  Please see above.   Original Report Authenticated By: Lacy Duverney, M.D.    Medications:   . albuterol  2.5 mg Nebulization TID  . amiodarone  200 mg Oral BID  . aspirin EC  81 mg Oral Daily  . carvedilol  4.6875 mg Oral BID WC  . cinacalcet  60 mg Oral Q breakfast  . darbepoetin      . darbepoetin (ARANESP) injection - DIALYSIS  200 mcg Intravenous Q Mon-HD  . docusate sodium  100 mg Oral BID  . feeding supplement (NEPRO CARB STEADY)  237 mL Oral BID WC  . heparin  5,000 Units Subcutaneous Q8H  . ipratropium  0.5 mg Nebulization TID  . methadone  5 mg Oral QID  . multivitamin  1 tablet Oral Daily  . pantoprazole  40 mg Oral Daily  .  simvastatin  20 mg Oral q1800  . sodium chloride  3 mL Intravenous Q12H  . sodium chloride  3 mL Intravenous Q12H

## 2012-10-17 NOTE — Progress Notes (Signed)
[facility]  Subjective: Felling better. Breathing and cough both improved.   Objective: Vital signs in last 24 hours: Temp:  [97.3 F (36.3 C)-98.6 F (37 C)] 97.3 F (36.3 C) (05/05 0837) Pulse Rate:  [60-69] 62 (05/05 1000) Resp:  [15-25] 15 (05/05 1000) BP: (85-126)/(47-82) 85/47 mmHg (05/05 1000) SpO2:  [91 %-96 %] 95 % (05/05 0837) Weight:  [100 lb 12 oz (45.7 kg)-103 lb 8 oz (46.947 kg)] 100 lb 12 oz (45.7 kg) (05/05 0837) Last BM Date: 10/15/12  Intake/Output from previous day:   Intake/Output this shift:    Medications Current Facility-Administered Medications  Medication Dose Route Frequency Provider Last Rate Last Dose  . 0.9 %  sodium chloride infusion  250 mL Intravenous PRN Marinda Elk, MD      . albuterol (PROVENTIL) (5 MG/ML) 0.5% nebulizer solution 2.5 mg  2.5 mg Nebulization TID Marinda Elk, MD   2.5 mg at 10/17/12 0741  . albuterol (PROVENTIL) (5 MG/ML) 0.5% nebulizer solution 2.5 mg  2.5 mg Nebulization Q4H PRN Marinda Elk, MD      . amiodarone (PACERONE) tablet 200 mg  200 mg Oral BID Marinda Elk, MD   200 mg at 10/16/12 2217  . aspirin EC tablet 81 mg  81 mg Oral Daily Marinda Elk, MD   81 mg at 10/16/12 1044  . carvedilol (COREG) tablet 4.6875 mg  4.6875 mg Oral BID WC Marinda Elk, MD   [phone] mg at 10/16/12 1638  . cinacalcet (SENSIPAR) tablet 60 mg  60 mg Oral Q breakfast Marinda Elk, MD   60 mg at 10/16/12 1042  . darbepoetin (ARANESP) injection 200 mcg  200 mcg Intravenous Q Mon-HD Kerin Salen, PA-C      . docusate sodium (COLACE) capsule 100 mg  100 mg Oral BID Marinda Elk, MD   100 mg at 10/16/12 2217  . feeding supplement (NEPRO CARB STEADY) liquid 237 mL  237 mL Oral BID WC Marinda Elk, MD   237 mL at 10/16/12 1200  . heparin injection 5,000 Units  5,000 Units Subcutaneous Q8H Marinda Elk, MD   5,000 Units at 10/16/12 2217  .  HYDROmorphone (DILAUDID) injection 1 mg  1 mg Intravenous Q2H PRN Marinda Elk, MD   1 mg at 10/17/12 0354  . ipratropium (ATROVENT) nebulizer solution 0.5 mg  0.5 mg Nebulization TID Marinda Elk, MD   0.5 mg at 10/17/12 0740  . methadone (DOLOPHINE) tablet 5 mg  5 mg Oral QID Marinda Elk, MD   5 mg at 10/16/12 2221  . multivitamin (RENA-VIT) tablet 1 tablet  1 tablet Oral Daily Marinda Elk, MD   1 tablet at 10/16/12 1044  . nitroGLYCERIN (NITROSTAT) SL tablet 0.4 mg  0.4 mg Sublingual Q5 min PRN Marinda Elk, MD      . ondansetron [facility]) tablet 4 mg  4 mg Oral Q6H PRN Marinda Elk, MD       Or  . ondansetron [facility]) injection 4 mg  4 mg Intravenous Q6H PRN Marinda Elk, MD   4 mg at 10/15/12 1436  . pantoprazole (PROTONIX) EC tablet 40 mg  40 mg Oral Daily Marinda Elk, MD   40 mg at 10/16/12 1042  . polyethylene glycol (MIRALAX / GLYCOLAX) packet 17 g  17 g Oral Daily PRN Marinda Elk, MD      . simvastatin (ZOCOR) tablet 20  mg  20 mg Oral q1800 Marinda Elk, MD   20 mg at 10/16/12 1826  . sodium chloride 0.9 % injection 3 mL  3 mL Intravenous Q12H Marinda Elk, MD   3 mL at 10/16/12 2218  . sodium chloride 0.9 % injection 3 mL  3 mL Intravenous Q12H Marinda Elk, MD   3 mL at 10/16/12 1549  . sodium chloride 0.9 % injection 3 mL  3 mL Intravenous PRN Marinda Elk, MD        PE: General appearance: alert, cooperative, cachectic and no distress Heart: regular rate and rhythm Extremities: 3+ bilateral pitting LEE Pulses: 2+ and symmetric Skin: warm and dry Neurologic: Grossly normal  Lab Results:   Recent Labs  10/15/12 0950 10/15/12 1501 10/16/12 0530  WBC 8.2 8.6 5.6  HGB 11.5* 11.0* 9.6*  HCT 34.9* 33.1* 29.8*  PLT 183 166 100*   BMET  Recent Labs  10/15/12 0950 10/15/12 1501 10/16/12 0530  NA 135  --  136  K 6.0*  --  4.0  CL 97  --  102  CO2 26  --  26  GLUCOSE 117*  --   63*  BUN 36*  --  12  CREATININE 1.96* 2.10* 1.04  CALCIUM 10.4  --  8.9   PT/INR  Recent Labs  10/16/12 0530  LABPROT 15.5*  INR 1.25   Assessment/Plan    Active Problems:   Ischemic cardiomyopathy, EF 20-25% echo April 2013, now 10% by cath (05/06/12)   Chronic systolic congestive heart failure, NYHA class 3   LBBB (left bundle branch block)   Hypertension   Pancreatitis, chronic   ESRD on hemodialysis   CAD, CABG X 4 Feb '08, LAD stent June '08   ICD (implantable cardiac defibrillator) in place, MDT placed Jan 2013 [facility]   Acute on chronic systolic heart failure   Hx of non-ST elevation myocardial infarction (NSTEMI), 10/10/12   Hyperkalemic syndrome   End of life care   Severe protein-calorie malnutrition   Cardiac cachexia   Palliative care encounter  Plan: SOB has improved significantly.  Telemetry shows paced rhythm.  She has been hypotensive but fairly asymptomatic. Plan for HD today for volume removal. Will continue to follow.     LOS: 2 days    Brittainy M. Delmer Islam 10/17/2012 10:36 AM   Patient seen and examined. Agree with assessment and plan. Currently undergoing hemodialysis.  Still with mild pedal/pretibial edema. No hypotension. No chest pain. ICD was re-programmed lasst week.   Lennette Bihari, MD, [facility] 10/17/2012 10:50 AM

## 2012-10-17 NOTE — Progress Notes (Addendum)
INITIAL NUTRITION ASSESSMENT  DOCUMENTATION CODES Per approved criteria  -Severe malnutrition in the context of chronic illness -Underweight   INTERVENTION: 1. Increase Nepro Shakes to TID 2. Continue liberalized diet 3. Pt does not want artifical nutrition - pt likely to continue to deteriorate nutritionally. Maximize oral nutrition supplements and continue liberalized diet. 4. RD to continue to follow nutrition care plan  NUTRITION DIAGNOSIS: Inadequate oral intake related to poor appetite as evidenced by poor oral intake and ongoing weight loss.   Goal: Intake to meet >90% of estimated nutrition needs.  Monitor:  weight trends, lab trends, I/O's, PO intake, supplement tolerance  Reason for Assessment: Malnutrition Screening  62 y.o. female  Admitting Dx: Acute Respiratory Failure  ASSESSMENT: Pt well-known to this RD.  Admitted with worsening SOB. Work-up reveals pulmonary edema 2/2 acute on chronic CHF. Was recently discharged 4 days ago. Team notes that hypotension on HD in part 2/2 cardiac cachexia and protein-calorie malnutrition.   Per renal team, plan to allow Heart Healthy diet with fluid restriction with Nepro once appropriate.  Pt reports that she was eating well at [facility]. Has only been home for 3 days. Enjoys Nepro Shakes but isn't receiving any while she's here.  Palliative care note 5/4 --> pt does NOT want artifical tube feedings, but does not want full comfort care measures at this time.  Nutrition Focused Physical Exam:  Subcutaneous Fat:  Orbital Region: moderate Upper Arm Region: severe Thoracic and Lumbar Region: n/a  Muscle:  Temple Region: moderate Clavicle Bone Region: severe Clavicle and Acromion Bone Region: severe Scapular Bone Region: n/a Dorsal Hand: severe Patellar Region: severe Anterior Thigh Region: severe Posterior Calf Region: severe  Edema: pitting edema below knees  Pt meets criteria for severe MALNUTRITION in the  context of chronic illness as evidenced by severe muscle and fat mass loss.    Height: Ht Readings from Last 1 Encounters:  10/16/12 5\' 4"  (1.626 m)    Weight: Wt Readings from Last 1 Encounters:  10/17/12 100 lb 12 oz (45.7 kg)   EDW: 41 kg  Ideal Body Weight: 120 lb  % Ideal Body Weight: 83%  Wt Readings from Last 10 Encounters:  10/17/12 100 lb 12 oz (45.7 kg)  10/13/12 100 lb 5 oz (45.5 kg)  10/13/12 100 lb 5 oz (45.5 kg)  09/03/12 100 lb 5 oz (45.5 kg)  08/03/12 120 lb (54.432 kg)  07/19/12 113 lb 5.1 oz (51.4 kg)  07/07/12 113 lb 12.1 oz (51.6 kg)  06/03/12 124 lb 1.9 oz (56.3 kg)  05/27/12 128 lb (58.06 kg)  05/11/12 119 lb 4.3 oz (54.1 kg)    Usual Body Weight: 115 - 120 lb  % Usual Body Weight: 94%  BMI:  Body mass index is 17.29 kg/(m^2). Underweight.  Estimated Nutritional Needs: Kcal: 1600 - 1750  Protein: 65 - 75 grams Fluid: 1.2 liters  Skin: intact  Diet Order: Cardiac   EDUCATION NEEDS: -No education needs identified at this time   Intake/Output Summary (Last 24 hours) at 10/17/12 1145 Last data filed at 10/17/12 0900  Gross per 24 hour  Intake    100 ml  Output      0 ml  Net    100 ml    Last BM: 5/3  Labs:   Recent Labs Lab 10/12/12 0633 10/13/12 0648 10/15/12 0950 10/15/12 1501 10/16/12 0530  NA 129* 134* 135  --  136  K 3.6 3.9 6.0*  --  4.0  CL 93*  98 97  --  102  CO2 27 29 26   --  26  BUN 34* 23 36*  --  12  CREATININE 2.35* 1.56* 1.96* 2.10* 1.04  CALCIUM 8.9 9.0 10.4  --  8.9  PHOS 2.9 2.3  --   --   --   GLUCOSE 92 134* 117*  --  63*    CBG (last 3)   Recent Labs  10/16/12 1152 10/16/12 1708 10/16/12 2225  GLUCAP 135* 70 93    Scheduled Meds: . albuterol  2.5 mg Nebulization TID  . amiodarone  200 mg Oral BID  . aspirin EC  81 mg Oral Daily  . carvedilol  4.6875 mg Oral BID WC  . cinacalcet  60 mg Oral Q breakfast  . darbepoetin (ARANESP) injection - DIALYSIS  200 mcg Intravenous Q Mon-HD  .  docusate sodium  100 mg Oral BID  . feeding supplement (NEPRO CARB STEADY)  237 mL Oral BID WC  . heparin  5,000 Units Subcutaneous Q8H  . ipratropium  0.5 mg Nebulization TID  . methadone  5 mg Oral QID  . multivitamin  1 tablet Oral Daily  . pantoprazole  40 mg Oral Daily  . simvastatin  20 mg Oral q1800  . sodium chloride  3 mL Intravenous Q12H  . sodium chloride  3 mL Intravenous Q12H    Continuous Infusions:    Past Medical History  Diagnosis Date  . Coronary artery disease     s/p CABG 2008 with multiple PCIs  . Ischemic cardiomyopathy     Cath 04/2012, significant calcifications  . History of nonadherence to medical treatment   . Systolic CHF     16/1096 echo EF 10%  . Esophageal varices   . Cirrhosis     hx of fatty liver per patient, has known ascites with repeated paracenteses at [facility] as of 2013, also +hx of esoph varices with banding in the past  . Pacemaker   . ICD (implantable cardiac defibrillator) in place   . Hypertension     "used to have HTN; now I'm low" (05/23/2012)  . NSTEMI (non-ST elevated myocardial infarction)     04/2012; Trop peaked to 1.39; 05/23/2012 pt denies ever having MI  . Pneumonia 02/2012    "first time ever" (05/23/2012)  . Chronic bronchitis 1970's thru 2002    "went away when I stopped smoking in 2002" (05/23/2012)  . Diabetes mellitus     Diet controlled  . History of blood transfusion     "alot of them" (05/23/2012)  . Iron (Fe) deficiency anemia     "severe" (05/23/2012)  . Pancreatitis, chronic 10/04/2011    Had severe gallstone pancreatitis in May 2003, no surgery, treated medically then returned in June 2003 for cholecystectomy and cyst gastrostomy for pancreatic pseudocyst. In March 2013 was admitted for hemorrhage into psueodcyst. In April 2013 was treated at [facility] for polymicrobial bacteremia (fungal, MRSA and enterobacter) felt to be due to ruptured pseudocyst, treated medically. Admitted Jan 2014 with abd pain and had gas and fluid  in pancreatic bed. She refused surgery ("would not make it") and had fluid aspirated by IR- gram stain showed yeast and cultures grew enterococcus species, sensitive to amp and vanc. She was seen by ID and discharged on IV vanc/fortaz with HD and po voriconazole.     [name] ESRD on hemodialysis 05/06/2012    T,Th,Sat HD by [employer] on [address]. Started hemodialysis around July 2013.    [name]  CHF (congestive heart failure)   . Complication of vascular access for dialysis 07/13/2012    L arm AVG (placed by Dr. Lyda Perone @ [facility]/25) was ligated 1/6 after significant L arm swelling secondary to L SCV stenosis and pacemaker; new access to be placed at right arm on 1/29 by Dr. Hollace Hayward    . NSTEMI (non-ST elevated myocardial infarction) 10/08/12    cardiac cath with no obvious acute lesion, medical therapy    Past Surgical History  Procedure Laterality Date  . Esophagogastroduodenoscopy  09/15/2011    Procedure: ESOPHAGOGASTRODUODENOSCOPY (EGD);  Surgeon: Theda Belfast, MD;  [facility] END[facility];  Service: Endoscopy;  Laterality: N/A;  . Cholecystectomy  2003  . Tonsillectomy and adenoidectomy  1961  . Appendectomy  1973?  [name] Vaginal hysterectomy  1973?  [name] Tubal ligation  1972  . Dilation and curettage of uterus      "bunch from profuse bleeding in the 1970's" (05/23/2012)  . Coronary artery bypass graft  2008    CABG X4  . Coronary angioplasty with stent placement  2008    "1; day after CABG" (05/23/2012)  . Cardiac catheterization      "before 2008 and 3 wk ago" (05/23/2012)  . Insert / replace / remove pacemaker  06/2011    pacemaker ICD  . Cardiac defibrillator placement  06/2011  . Arteriovenous graft placement  03/2012    right antecub  . Insertion of dialysis catheter  ~ 10/2011    right chest  . Peritoneal catheter insertion  08/2011  . Peritoneal catheter removal  ~ 10/2011    Jarold Motto MS, RD, LDN Pager: [phone] After-hours pager: [phone]

## 2012-10-17 NOTE — Progress Notes (Signed)
Palliative Medicine Team SW Pt familiar to PMT, discussed in PMT rounds. Came by for psychosocial support, pt in H/D. Will follow up.   Kennieth Francois, LCSWA PMT PHONE [phone] Pager [phone]

## 2012-10-17 NOTE — Procedures (Signed)
Successful US guided paracentesis from LLQ.  Yielded of cloudy bloody ascitic fluid.  No immediate complications.  Pt tolerated well.   Specimen was sent for labs.  Brayton El PA-C 10/17/2012 3:34 PM

## 2012-10-18 ENCOUNTER — Telehealth: Payer: Self-pay | Admitting: Cardiology

## 2012-10-18 LAB — CBC
MCH: 29.5 pg (ref 26.0–34.0)
MCHC: 32.5 g/dL (ref 30.0–36.0)
MCV: 91 fL (ref 78.0–100.0)
Platelets: 112 10*3/uL — ABNORMAL LOW (ref 150–400)
RDW: 17.6 % — ABNORMAL HIGH (ref 11.5–15.5)
WBC: 6 10*3/uL (ref 4.0–10.5)

## 2012-10-18 LAB — RENAL FUNCTION PANEL
Albumin: 2.1 g/dL — ABNORMAL LOW (ref 3.5–5.2)
BUN: 14 mg/dL (ref 6–23)
Calcium: 8.3 mg/dL — ABNORMAL LOW (ref 8.4–10.5)
Chloride: 98 mEq/L (ref 96–112)
Creatinine, Ser: 1.49 mg/dL — ABNORMAL HIGH (ref 0.50–1.10)
GFR calc non Af Amer: 36 mL/min — ABNORMAL LOW (ref >90)
Phosphorus: 1.8 mg/dL — ABNORMAL LOW (ref 2.3–4.6)

## 2012-10-18 LAB — BASIC METABOLIC PANEL
BUN: 13 mg/dL (ref 6–23)
CO2: 30 mEq/L (ref 19–32)
Calcium: 8.4 mg/dL (ref 8.4–10.5)
Creatinine, Ser: 1.41 mg/dL — ABNORMAL HIGH (ref 0.50–1.10)

## 2012-10-18 MED ORDER — ALBUTEROL SULFATE (5 MG/ML) 0.5% IN NEBU
INHALATION_SOLUTION | RESPIRATORY_TRACT | Status: AC
  Administered 2012-10-18: 2.5 mg via RESPIRATORY_TRACT
  Filled 2012-10-18: qty 0.5

## 2012-10-18 MED ORDER — METHADONE HCL 5 MG PO TABS: 5.0000 mg | ORAL_TABLET | Freq: Four times a day (QID) | ORAL | Status: DC

## 2012-10-18 MED ORDER — K PHOS MONO-SOD PHOS DI & MONO 155-852-130 MG PO TABS
250.0000 mg | ORAL_TABLET | Freq: Two times a day (BID) | ORAL | Status: DC
  Filled 2012-10-18: qty 1

## 2012-10-18 MED ORDER — HYDROMORPHONE HCL 2 MG PO TABS: 4.0000 mg | ORAL_TABLET | Freq: Four times a day (QID) | ORAL | Status: DC | PRN

## 2012-10-18 MED ORDER — IPRATROPIUM BROMIDE 0.02 % IN SOLN
RESPIRATORY_TRACT | Status: AC
  Administered 2012-10-18: 0.5 mg via RESPIRATORY_TRACT
  Filled 2012-10-18: qty 2.5

## 2012-10-18 NOTE — Progress Notes (Signed)
[facility]  Subjective: Feeling better.  Still with some SOB.    Objective: Vital signs in last 24 hours: Temp:  [97.5 F (36.4 C)-97.6 F (36.4 C)] 97.6 F (36.4 C) (05/06 0606) Pulse Rate:  [59-67] 62 (05/06 0930) Resp:  [15-20] 19 (05/06 0930) BP: (87-123)/(40-80) 99/57 mmHg (05/06 0930) SpO2:  [94 %-100 %] 98 % (05/06 0930) Weight:  [92 lb 2.4 oz (41.8 kg)] 92 lb 2.4 oz (41.8 kg) (05/06 0747) Last BM Date: 10/15/12  Intake/Output from previous day: 05/05 0701 - 05/06 0700 In: 100 [P.O.:100] Out: 2000  Intake/Output this shift:    Medications Current Facility-Administered Medications  Medication Dose Route Frequency Provider Last Rate Last Dose  . 0.9 %  sodium chloride infusion  250 mL Intravenous PRN Marinda Elk, MD      . albuterol (PROVENTIL) (5 MG/ML) 0.5% nebulizer solution 2.5 mg  2.5 mg Nebulization TID Marinda Elk, MD   2.5 mg at 10/18/12 0848  . albuterol (PROVENTIL) (5 MG/ML) 0.5% nebulizer solution 2.5 mg  2.5 mg Nebulization Q4H PRN Marinda Elk, MD   2.5 mg at 10/18/12 0506  . amiodarone (PACERONE) tablet 200 mg  200 mg Oral BID Marinda Elk, MD   200 mg at 10/17/12 2146  . aspirin EC tablet 81 mg  81 mg Oral Daily Marinda Elk, MD   81 mg at 10/17/12 1322  . carvedilol (COREG) tablet 4.6875 mg  4.6875 mg Oral BID WC Marinda Elk, MD   [phone] mg at 10/17/12 1819  . cinacalcet (SENSIPAR) tablet 60 mg  60 mg Oral Q breakfast Marinda Elk, MD   60 mg at 10/17/12 1321  . darbepoetin (ARANESP) injection 200 mcg  200 mcg Intravenous Q Mon-HD Kerin Salen, PA-C   200 mcg at 10/17/12 1136  . docusate sodium (COLACE) capsule 100 mg  100 mg Oral BID Marinda Elk, MD   100 mg at 10/17/12 2147  . feeding supplement (NEPRO CARB STEADY) liquid 237 mL  237 mL Oral TID BM Haynes Bast, RD   237 mL at 10/17/12 2000  . heparin injection 5,000 Units  5,000 Units Subcutaneous Q8H  Marinda Elk, MD   5,000 Units at 10/18/12 0510  . HYDROmorphone (DILAUDID) tablet 4 mg  4 mg Oral Q4H PRN Richarda Overlie, MD   4 mg at 10/17/12 1603  . ipratropium (ATROVENT) nebulizer solution 0.5 mg  0.5 mg Nebulization TID Marinda Elk, MD   0.5 mg at 10/18/12 0848  . methadone (DOLOPHINE) tablet 5 mg  5 mg Oral QID Marinda Elk, MD   5 mg at 10/17/12 1819  . multivitamin (RENA-VIT) tablet 1 tablet  1 tablet Oral Daily Marinda Elk, MD   1 tablet at 10/17/12 1321  . nitroGLYCERIN (NITROSTAT) SL tablet 0.4 mg  0.4 mg Sublingual Q5 min PRN Marinda Elk, MD      . ondansetron [facility]) tablet 4 mg  4 mg Oral Q6H PRN Marinda Elk, MD       Or  . ondansetron [facility]) injection 4 mg  4 mg Intravenous Q6H PRN Marinda Elk, MD   4 mg at 10/15/12 1436  . pantoprazole (PROTONIX) EC tablet 40 mg  40 mg Oral Daily Marinda Elk, MD   40 mg at 10/17/12 1322  . polyethylene glycol (MIRALAX / GLYCOLAX) packet 17 g  17 g Oral Daily PRN Marinda Elk, MD      .  simvastatin (ZOCOR) tablet 20 mg  20 mg Oral q1800 Marinda Elk, MD   20 mg at 10/17/12 1819  . sodium chloride 0.9 % injection 3 mL  3 mL Intravenous Q12H Marinda Elk, MD   3 mL at 10/16/12 2218  . sodium chloride 0.9 % injection 3 mL  3 mL Intravenous Q12H Marinda Elk, MD   3 mL at 10/17/12 2150  . sodium chloride 0.9 % injection 3 mL  3 mL Intravenous PRN Marinda Elk, MD        PE: General appearance: alert, cooperative and no distress Lungs: clear to auscultation bilaterally Heart: regular rate and rhythm Ext: 1+ LEE Pulses: DPs 1+ and symmetric Neurologic: Grossly normal  Lab Results:   Recent Labs  10/15/12 1501 10/16/12 0530 10/18/12 0806  WBC 8.6 5.6 6.0  HGB 11.0* 9.6* 9.8*  HCT 33.1* 29.8* 30.2*  PLT 166 100* 112*   BMET  Recent Labs  10/16/12 0530 10/18/12 0600 10/18/12 0806  NA 136 136 135  K 4.0 3.4* 3.2*  CL 102 99 98  CO2  26 30 30   GLUCOSE 63* 72 130*  BUN 12 13 14   CREATININE 1.04 1.41* 1.49*  CALCIUM 8.9 8.4 8.3*   PT/INR  Recent Labs  10/16/12 0530  LABPROT 15.5*  INR 1.25   Assessment/Plan   Active Problems:   Ischemic cardiomyopathy, EF 20-25% echo April 2013, now 10% by cath (05/06/12)   Chronic systolic congestive heart failure, NYHA class 3   LBBB (left bundle branch block)   Hypertension   Pancreatitis, chronic   ESRD on hemodialysis   CAD, CABG X 4 Feb '08, LAD stent June '08   ICD (implantable cardiac defibrillator) in place, MDT placed Jan 2013 [facility] [place]   Acute on chronic systolic heart failure   Hx of non-ST elevation myocardial infarction (NSTEMI), 10/10/12   Hyperkalemic syndrome   End of life care   Severe protein-calorie malnutrition   Cardiac cachexia   Palliative care encounter  Plan:  Feeling better.  Seen in hemodialysis.  Net fluids: -1.9L(24hrs): 3.3L(since adm). Mildly hypotensive to normotensive.  SP US guided paracentesis of LLQ yielding of cloudy, bloody ascitic fluid.  Still with 1+ LEE but improved compared to last week.   LOS: 3 days    Rebbeca Sheperd 10/18/2012 10:10 AM

## 2012-10-18 NOTE — Procedures (Signed)
I was present at this dialysis session. I have reviewed the session itself and made appropriate changes.   Vinson Moselle, MD [employer] 10/18/2012, 3:33 PM

## 2012-10-18 NOTE — Evaluation (Signed)
Physical Therapy Evaluation Patient Details Name: [name] MRN: [id number] DOB: 19-Jan-1951 Today's Date: 10/18/2012 Time: 4259-5638 PT Time Calculation (min): 32 min  PT Assessment / Plan / Recommendation Clinical Impression  [name] is a 62 y/o female with chronic systolic heart failure.  Pt presents to PT with generalized weakness in bilateral LE and decreased cardio-pulmonary endurance.  Pt will benefit from continued PT in home to maximize safety with mobility.  Pt's did not present with a significant decrease in  SpO2 when ambulating on room air.  However pt's respiratory rate increased significantly on room air. Pt was able to ambulate more than twice the distance with O2 than without O2 and recovery time was significantly decreased when pt used supplimental O2.  [name] would benefit from trial of home O2 minmize carido-respiratory strain with funcitional mobility.   If supplimental O2 is not approved pt would benefit from Rollator (4 wheeled walker with seat)  to minimize risk of fatigue related fall.      PT Assessment  All further PT needs can be met in the next venue of care    Follow Up Recommendations  Home health PT;Supervision/Assistance - 24 hour    Does the patient have the potential to tolerate intense rehabilitation      Barriers to Discharge None      Equipment Recommendations       Recommendations for Other Services     Frequency      Precautions / Restrictions Precautions Precautions: Fall Restrictions Weight Bearing Restrictions: No    Pertinent Vitals/Pain SATURATION QUALIFICATIONS: (This note is used to comply with regulatory documentation for home oxygen)  Patient Saturations on Room Air at Rest = 99%  Patient Saturations on Room Air while Ambulating = 92%  Patient Saturations on 2 Liters of oxygen while Ambulating = 99%  Please briefly explain why patient needs home oxygen: Pt' Respiratory rate increased from  16 to 29 while ambulating on  Room air,  Pt's respiratory rate increased from 16 to 20 on 2L of O2 via [place].  Pt's max distance ambulated on room air was 40 feet.  Pt able to ambulate 90 feet on 2L O2.  Pt required several minutes to recover after ambulating on Room air, while requiring <1 minute on 2L O2.         Mobility  Bed Mobility Bed Mobility: Not assessed Transfers Transfers: Sit to Stand;Stand to Sit Sit to Stand: 4: Min guard Stand to Sit: 4: Min guard Ambulation/Gait Ambulation/Gait Assistance: 4: Min guard Ambulation Distance (Feet): 90 Feet (40 feet without O2, 90 feet with O2) Assistive device: Rolling walker Ambulation/Gait Assistance Details: VCs for decreased distance from walker.   Gait Pattern: Decreased stride length;Narrow base of support;Trunk flexed Stairs: No Wheelchair Mobility Wheelchair Mobility: No    Exercises     PT Diagnosis: Generalized weakness  PT Problem List: Decreased strength;Decreased activity tolerance;Decreased balance;Decreased mobility;Cardiopulmonary status limiting activity PT Treatment Interventions:     PT Goals Acute Rehab PT Goals PT Goal Formulation: With patient  Visit Information  Last PT Received On: 10/18/12    Subjective Data  Subjective: I need oxygen when I am walking  Patient Stated Goal: return home   Prior Functioning  Home Living Lives With: Family Available Help at Discharge: Available PRN/intermittently;Family;Personal care attendant Type of Home: Apartment Home Access: Level entry Home Layout: One level Bathroom Shower/Tub: Engineer, manufacturing systems: Standard Bathroom Accessibility: Yes How Accessible: Accessible via walker Home Adaptive Equipment: Bedside  commode/3-in-1 Additional Comments: has a BSC that fits over her guest bath but will not fit over her toilet. She often cannot get all the way to guest bathroom Prior Function Level of Independence: Needs assistance Needs Assistance: Bathing;Dressing;Meal Prep;Light  Housekeeping Bath: Minimal Dressing: Minimal Meal Prep: Total Light Housekeeping: Total Able to Take Stairs?: No Driving: No Vocation: Retired    Museum/gallery curator: Awake/alert Behavior During Therapy: WFL for tasks assessed/performed Overall Cognitive Status: Within Functional Limits for tasks assessed    Extremity/Trunk Assessment Right Lower Extremity Assessment RLE ROM/Strength/Tone: Deficits RLE ROM/Strength/Tone Deficits: grossly 3/5 throughout RLE Sensation: WFL - Light Touch;WFL - Proprioception RLE Coordination: WFL - gross motor Left Lower Extremity Assessment LLE ROM/Strength/Tone: Deficits LLE ROM/Strength/Tone Deficits: grossly 3/5 throughout LLE Sensation: WFL - Light Touch;WFL - Proprioception LLE Coordination: WFL - gross motor Trunk Assessment Trunk Assessment: Kyphotic   Balance Balance Balance Assessed: No  End of Session PT - End of Session Equipment Utilized During Treatment: Gait belt;Oxygen Activity Tolerance: Patient limited by fatigue Patient left: in chair;with call bell/phone within reach Nurse Communication: Mobility status  GP     [name] 10/18/2012, 4:12 PM

## 2012-10-18 NOTE — Progress Notes (Signed)
Patient WU:[id number] A Agro      DOB: December 04, 1950      NWG:[id number]   Palliative Medicine Team at [facility] Progress Note    Subjective:Patient sitting up on side of bed, talking with family. Patient verbalized request to be discharged today with home oxygen, states she does get SOB with activity. Also stated that oral as needed dosing with Dilaudid does help pain if given when she asks-had 1 dose in past 24 hours   Filed Vitals:   10/18/12 1500  BP: 95/75  Pulse: 75  Temp: 97.8 F (36.6 C)  Resp: 17   Physical exam: General: Alert, thin cachetic  HEENT: anicteric, buccal mucosa moist  CHEST: scattered fine rhonchi  CVS: RRR  ABD: diffuse upper abdominal pain upon palpation, BS audible (BM 5/5)  EXT: 1-2+ pedal edema (pitting)  Assessment and plan:  1) Pain:  Ordered Dilaudid 4 mg every 4 hours as needed. Educated patient on dose change-she is agreeable, nursing staff also aware. Plan is to contact pain clinic at [facility] for refills after discharge-will need prescriptions for Methadone and Dilaudid at discharge to carry her through intial appointment at [facility]  2) Dyspnea: Spoke with Dr Rennis Golden, he will order for home oxygen evaluation prior to discharge  3) Disposition: plan is for discharge home with home health services-to receive HD 4 times per week  Time In Time Out Total Time Spent with Patient Total Overall Time  1:30p 2:00p 30 min 30 min   Freddie Breech, CNS-C Palliative Medicine Team [facility] Team Phone: [phone] Pager: [phone]

## 2012-10-18 NOTE — Progress Notes (Signed)
Subjective:  On hd , feels better since paracentesis yesterday  Objective Vital signs in last 24 hours: Filed Vitals:   10/18/12 0830 10/18/12 0849 10/18/12 0900 10/18/12 0930  BP: 108/54  97/56 99/57  Pulse: 59  62 62  Temp:      TempSrc:      Resp: 20  19 19   Height:      Weight:      SpO2: 98% 96% 97% 98%   Weight change: -1.247 kg (-2 lb 12 oz)  Intake/Output Summary (Last 24 hours) at 10/18/12 1001 Last data filed at 10/17/12 1249  Gross per 24 hour  Intake      0 ml  Output   2000 ml  Net  -2000 ml   Labs: Basic Metabolic Panel:  Recent Labs Lab 10/12/12 0633 10/13/12 0648  10/16/12 0530 10/18/12 0600 10/18/12 0806  NA 129* 134*  < > 136 136 135  K 3.6 3.9  < > 4.0 3.4* 3.2*  CL 93* 98  < > 102 99 98  CO2 27 29  < > 26 30 30   GLUCOSE 92 134*  < > 63* 72 130*  BUN 34* 23  < > 12 13 14   CREATININE 2.35* 1.56*  < > 1.04 1.41* 1.49*  CALCIUM 8.9 9.0  < > 8.9 8.4 8.3*  PHOS 2.9 2.3  --   --   --  1.8*  < > = values in this interval not displayed. Liver Function Tests:  Recent Labs Lab 10/13/12 0648 10/15/12 0950 10/18/12 0806  AST  --  34  --   ALT  --  16  --   ALKPHOS  --  244*  --   BILITOT  --  0.7  --   PROT  --  7.5  --   ALBUMIN 2.3* 2.5* 2.1*   No results found for this basename: LIPASE, AMYLASE,  in the last 168 hours No results found for this basename: AMMONIA,  in the last 168 hours CBC:  Recent Labs Lab 10/13/12 0600 10/15/12 0950 10/15/12 1501 10/16/12 0530 10/18/12 0806  WBC 5.7 8.2 8.6 5.6 6.0  NEUTROABS  --  6.7  --   --   --   HGB 8.0* 11.5* 11.0* 9.6* 9.8*  HCT 24.3* 34.9* 33.1* 29.8* 30.2*  MCV 90.0 88.8 88.7 90.0 91.0  PLT 105* 183 166 100* 112*   Cardiac Enzymes:  Recent Labs Lab 10/15/12 0926 10/15/12 1501 10/15/12 1811 10/16/12 0212  CKTOTAL  --  30  --   --   CKMB  --  3.1  --   --   TROPONINI 1.22* 1.17* 1.29* 0.93*   CBG:  Recent Labs Lab 10/16/12 0906 10/16/12 0955 10/16/12 1152 10/16/12 1708  10/16/12 2225  GLUCAP 61* 80 135* 70 93    Iron Studies:  Recent Labs  10/15/12 1501  IRON 45  TIBC 160*  FERRITIN 5039*   Studies/Results: US Paracentesis  10/17/2012  *RADIOLOGY REPORT*  Clinical Data: Recurrent abdominal ascites  ULTRASOUND GUIDED PARACENTESIS  Comparison:  Previous paracentesis  An ultrasound guided paracentesis was thoroughly discussed with the patient and questions answered.  The benefits, risks, alternatives and complications were also discussed.  The patient understands and wishes to proceed with the procedure.  Written consent was obtained.  Ultrasound was performed to localize and mark an adequate pocket of fluid in the left lower quadrant of the abdomen.  The area was then prepped and draped in the normal sterile fashion.  1% Lidocaine was used for local anesthesia.  Under ultrasound guidance a 19 gauge Yueh catheter was introduced.  Paracentesis was performed.  The catheter was removed and a dressing applied.  Complications:  None  Findings:  A total of approximately 1160 ml of cloudy bloody ascitic fluid was removed.  A fluid sample was sent for laboratory analysis.  IMPRESSION: Successful ultrasound guided paracentesis yielding 1160 ml of ascites.  Read by Brayton El PA-C   Original Report Authenticated By: Irish Lack, M.D.    Dg Chest Port 1 View  10/16/2012  *RADIOLOGY REPORT*  Clinical Data: Pulmonary edema post hemodialysis.  PORTABLE CHEST - 1 VIEW  Comparison: 10/15/2012.  Findings: Right central line tip right atrium level.  Skin folds on the right suspected without pneumothorax.  Attention to this possibility follow-up.  AICD / pacemaker enters from the left.  Leads unchanged in position.  Post CABG.  Cardiomegaly.  Pulmonary edema.  Basilar infiltrate or right not excluded.  IMPRESSION: Cardiomegaly of pulmonary edema.  Right base infiltrate not excluded.  Prominent skin folds on the right suspected.  Please see above.   Original Report Authenticated By:  Lacy Duverney, M.D.    Medications:   . albuterol  2.5 mg Nebulization TID  . amiodarone  200 mg Oral BID  . aspirin EC  81 mg Oral Daily  . carvedilol  4.6875 mg Oral BID WC  . cinacalcet  60 mg Oral Q breakfast  . darbepoetin (ARANESP) injection - DIALYSIS  200 mcg Intravenous Q Mon-HD  . docusate sodium  100 mg Oral BID  . feeding supplement (NEPRO CARB STEADY)  237 mL Oral TID BM  . heparin  5,000 Units Subcutaneous Q8H  . ipratropium  0.5 mg Nebulization TID  . methadone  5 mg Oral QID  . multivitamin  1 tablet Oral Daily  . pantoprazole  40 mg Oral Daily  . simvastatin  20 mg Oral q1800  . sodium chloride  3 mL Intravenous Q12H  . sodium chloride  3 mL Intravenous Q12H     Physical Exam: General: alert on hd , nad Heart: RRR, 1/6 sem l sb , no rub Lungs: Decreased bs at bases Abdomen: bs pos. , no nontender, soft Extremities: Dialysis Access: 2 = bilat,. Pedal edema. ij perm cath on hd   Outpatient HD: MWF @ [facility] - Was to start there 5/5. (pre[facility] at [facility])  4 hrs 4K/2.5Ca bath 45.5kg (needs titration due to freq hospitalizations) Tight Heparin. Epo 28K No IV Fe or vit D (needs updated labs) R IJ cath   Assessment/Plan:  1. Pulmonary edema / severe DCM-  Have changed to 4x/wk dialysis on MWThF at [facility] 2. Hypokalemia - 3.2 k  Use 4 k bath  3. ESRD- HD was at [facility], now changing to [facility], I called and they have available Mon  WED THURS  FRI hd Available.PER  [facility] 4. Abdominal Pain/ Chronic Pancreatitis - SP paracentesis  With 1160 cc fluid removed/ prior CT showed complex ascites in pelvis, loculated and is compressing sigmoid colon, bladder, and displacing small bowel. Continue dilaudid and methadone per primary. 5. HTN/Volume - SBPs stable in 90s on hd now with uf and tolerating-  BP limiting fluid removal. Hoping removal of Ascites contributing to her wgt.  4 x week HD. Consider Midodrine pre-hd 6. SVT/  NSTEMI - s/p cardioversion on 5/1, Cardiac cath and ICD reprogrammed. Troponin positive but decreasing, EF of 10%;  on amiodarone, low dose coreg bid.  7. Anemia - Hgb 9.8< 9.6 <11.5 > 7.9 s/p max Aranesp and transfusion last admission. Tsat 28%. Ferritin 5039 - ? Inflammation vs lab error. Resume max Aranesp. 8. Secondary hyperparathyroidism- Ca 8.3(9.5 corrected) P 1.1 - give po replacement  K phos/ Renvela d/c'd last admission/ Will DC sensipar. No op hectorol. Recheck PTH. 9. Nutrition - Alb 2.5>2.1; eating now. Will allow heart healthy regular diet with fluid restriction.Nepro. Multivitamin 10. Dispo - Pt aware of poor prognosis. She wants limited code (amenable to cardioversion and antiarrythmics.) Does not want full comfort care at this time   Lenny Pastel, PA-C [facility] Beeper [phone] 10/18/2012,10:01 AM  LOS: 3 days   Patient seen and examined.  Agree with assessment and plan as above. Vinson Moselle  MD [phone] pgr    [phone] cell 10/18/2012, 3:32 PM

## 2012-10-18 NOTE — Progress Notes (Signed)
Pt. Seen and examined. Agree with the NP/PA-C note as written.   Breathing is better, less edematous, belly is soft after paracentesis. She feels that she needs home oxygen. Will order ambulatory saturations. Please document in the chart. Plan for discharge today. [facility] and [facility] will [facility] her [facility] for pain control.  Follow-up in our office TCM 7 with MLP.  Chrystie Nose, MD, [facility] Attending Cardiologist [facility]

## 2012-10-18 NOTE — Progress Notes (Signed)
Pt alert and oriented x4.  Pt and patient's daughter given discharge instructions. Both deny any questions or concerns.  Pt removed IV access earlier before I took over care of patient.  Pt was also already discharged from telemetry.  Patient was to be discharged per wheelchair with tech, but patient and patient's daughter were no where to be found when the tech was to discharge the patient to private vehicle.

## 2012-10-18 NOTE — Discharge Summary (Signed)
Physician Discharge Summary  [name] MRN: [id number] DOB/AGE: 1951-03-14 62 y.o.  PCP: Jyl Heinz, MD   Admit date: 10/15/2012 Discharge date: 10/18/2012  Discharge Diagnoses:   :   Ischemic cardiomyopathy, EF 20-25% echo April 2013, now 10% by cath (05/06/12)   Chronic systolic congestive heart failure, NYHA class 3   LBBB (left bundle branch block)   Hypertension   Pancreatitis, chronic   ESRD on hemodialysis   CAD, CABG X 4 Feb '08, LAD stent June '08   ICD (implantable cardiac defibrillator) in place, MDT placed Jan 2013 [facility] [place]   Acute on chronic systolic heart failure   Hx of non-ST elevation myocardial infarction (NSTEMI), 10/10/12   Hyperkalemic syndrome   End of life care   Severe protein-calorie malnutrition   Cardiac cachexia   Palliative care encounter     Medication List    TAKE these medications       amiodarone 200 MG tablet  Commonly known as:  PACERONE  Take 1 tablet (200 mg total) by mouth 2 (two) times daily.     aspirin 81 MG EC tablet  Take 1 tablet (81 mg total) by mouth daily.     carvedilol 3.125 MG tablet  Commonly known as:  COREG  Take 1.5 tablets (4.6875 mg total) by mouth 2 (two) times daily with a meal.     cinacalcet 30 MG tablet  Commonly known as:  SENSIPAR  Take 2 tablets (60 mg total) by mouth daily with breakfast.     DSS 100 MG Caps  Take 100 mg by mouth 2 (two) times daily.     feeding supplement (NEPRO CARB STEADY) Liqd  Take 237 mLs by mouth 2 (two) times daily with breakfast and lunch.     HYDROmorphone 2 MG tablet  Commonly known as:  DILAUDID  Take 1 tablet (2 mg total) by mouth every 6 (six) hours as needed (severe pain.). For pain     methadone 5 MG tablet  Commonly known as:  DOLOPHINE  Take 1 tablet (5 mg total) by mouth 4 (four) times daily.     multivitamin Tabs tablet  Take 1 tablet by mouth daily.     nitroGLYCERIN 0.4 MG SL tablet  Commonly known as:  NITROSTAT  Place 1 tablet (0.4  mg total) under the tongue every 5 (five) minutes as needed for chest pain.     pantoprazole 40 MG tablet  Commonly known as:  PROTONIX  Take 40 mg by mouth daily.     pravastatin 20 MG tablet  Commonly known as:  PRAVACHOL  Take 20 mg by mouth at bedtime.     ranolazine 500 MG 12 hr tablet  Commonly known as:  RANEXA  Take 500 mg by mouth daily.     sevelamer 400 MG tablet  Commonly known as:  RENAGEL  Take 1 tablet (400 mg total) by mouth 3 (three) times daily with meals. With a meal        Discharge Condition: guareded\  Disposition: 01-Home or Self Care   Consults:  Cardiology Palliative care nephrology   Significant Diagnostic Studies: Ct Abdomen Pelvis W Contrast  10/15/2012  *RADIOLOGY REPORT*  Clinical Data: Lower abdominal pain with nausea and vomiting. Constipation.  Diabetic on dialysis over 1 year.  Prior appendectomy, hysterectomy and cholecystectomy.  CT ABDOMEN AND PELVIS WITH CONTRAST  Technique:  Multidetector CT imaging of the abdomen and pelvis was performed following the standard protocol during bolus  administration of intravenous contrast.  Contrast: 80mL OMNIPAQUE IOHEXOL 300 MG/ML  SOLN  Comparison: 08/19/2012.  Findings: AICD is in place.  Marked cardiomegaly.  Diffuse ascites complex with a loculated appearance. In the pelvis, loculated ascites is displacing sigmoid colon posteriorly and small bowel away from the pelvis.  Areas of loculated free air surround the spleen.  This may represent residua of infection previously noted.  Upper left lateral abdominal wall abscess remains measuring 2.5 x 4.4 x 5.5 cm with a loculated septated appearance.  Overall dimensions without significant change.  Abnormal appearance left lobe liver.  This may represent alterations in vascular supply to the liver rather than primary mass.  Small kidneys with calcifications and cysts.  Atherosclerotic type changes aorta with ectasia.  Atherosclerotic type changes iliac arteries with  ectasia and narrowing.  Prominent branch vessel atherosclerotic type changes.  Right pleural effusion.  Right base infiltrate/atelectasis. Minimal atelectatic changes left base.  No obvious pancreatic abnormality.  Hyperplasia adrenal glands.  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  IMPRESSION: Complex ascites.  In the pelvis this appears loculated and is compressing the sigmoid colon, bladder and displacing small bowel from the pelvis.  Areas of loculated free air surround the spleen.  This may represent residua of infection previously noted.  Upper left lateral wall abscess is septated and has overall dimensions relatively similar to the prior exam.  Marked cardiomegaly.  Right pleural effusion.  Right base infiltrate/atelectasis. Minimal atelectatic changes left base.  Significant atherosclerotic type changes.  Renal osteodystrophy changes of the spine.   Original Report Authenticated By: Lacy Duverney, M.D.    US Paracentesis  10/17/2012  *RADIOLOGY REPORT*  Clinical Data: Recurrent abdominal ascites  ULTRASOUND GUIDED PARACENTESIS  Comparison:  Previous paracentesis  An ultrasound guided paracentesis was thoroughly discussed with the patient and questions answered.  The benefits, risks, alternatives and complications were also discussed.  The patient understands and wishes to proceed with the procedure.  Written consent was obtained.  Ultrasound was performed to localize and mark an adequate pocket of fluid in the left lower quadrant of the abdomen.  The area was then prepped and draped in the normal sterile fashion.  1% Lidocaine was used for local anesthesia.  Under ultrasound guidance a 19 gauge Yueh catheter was introduced.  Paracentesis was performed.  The catheter was removed and a dressing applied.  Complications:  None  Findings:  A total of approximately 1160 ml of cloudy bloody ascitic fluid was removed.  A fluid sample was sent for laboratory analysis.  IMPRESSION: Successful ultrasound  guided paracentesis yielding 1160 ml of ascites.  Read by Brayton El PA-C   Original Report Authenticated By: Irish Lack, M.D.    Dg Chest Port 1 View  10/16/2012  *RADIOLOGY REPORT*  Clinical Data: Pulmonary edema post hemodialysis.  PORTABLE CHEST - 1 VIEW  Comparison: 10/15/2012.  Findings: Right central line tip right atrium level.  Skin folds on the right suspected without pneumothorax.  Attention to this possibility follow-up.  AICD / pacemaker enters from the left.  Leads unchanged in position.  Post CABG.  Cardiomegaly.  Pulmonary edema.  Basilar infiltrate or right not excluded.  IMPRESSION: Cardiomegaly of pulmonary edema.  Right base infiltrate not excluded.  Prominent skin folds on the right suspected.  Please see above.   Original Report Authenticated By: Lacy Duverney, M.D.    Dg Chest Port 1 View  10/15/2012  *RADIOLOGY REPORT*  Clinical Data: Shortness of breath.  History of dialysis and  CHF.  PORTABLE CHEST - 1 VIEW  Comparison: 10/08/2012  Findings: Right jugular dialysis catheter tip is in the right atrium.  There is a left cardiac ICD.  Heart remains enlarged for size.  Prominent interstitial densities suggest mild edema or vascular congestion.  Limited evaluation of the right costophrenic angle and cannot exclude a small right pleural effusion.  IMPRESSION: Cardiomegaly with mild interstitial edema or vascular congestion.   Original Report Authenticated By: Richarda Overlie, M.D.    Dg Chest Port 1 View  10/08/2012  *RADIOLOGY REPORT*  Clinical Data: Shortness of breath  PORTABLE CHEST - 1 VIEW  Comparison: 08/19/2012  Findings: Pacing pad are noted over the chest, obscuring detail. Left-sided AICD in place.  Right IJ approach hemodialysis catheter tips over the right atrium.  Moderate enlargement of the cardiomediastinal silhouette again noted with central vascular congestion and hazy perihilar airspace opacity.  Trace right pleural effusion.  Findings are not significantly changed.  Evidence of CABG again noted.  IMPRESSION: Stable findings of cardiomegaly with hazy pulmonary opacities and right pleural effusion that could indicate pulmonary edema.   Original Report Authenticated By: Christiana Pellant, M.D.       Microbiology: Recent Results (from the past 240 hour(s))  MRSA PCR SCREENING     Status: None   Collection Time    10/09/12  1:28 AM      Result Value Range Status   MRSA by PCR NEGATIVE  NEGATIVE Final   Comment:            The GeneXpert MRSA Assay (FDA     approved for NASAL specimens     only), is one component of a     comprehensive MRSA colonization     surveillance program. It is not     intended to diagnose MRSA     infection nor to guide or     monitor treatment for     MRSA infections.  BODY FLUID CULTURE     Status: None   Collection Time    10/17/12  3:27 PM      Result Value Range Status   Specimen Description FLUID ASCITIC ABDOMEN   Final   Special Requests FLUID   Final   Gram Stain     Final   Value: RARE WBC PRESENT, PREDOMINANTLY MONONUCLEAR     NO ORGANISMS SEEN   Culture NO GROWTH 1 DAY   Final   Report Status PENDING   Incomplete     Labs: Results for orders placed during the hospital encounter of 10/15/12 (from the past 48 hour(s))  GLUCOSE, CAPILLARY     Status: Abnormal   Collection Time    10/16/12 11:52 AM      Result Value Range   Glucose-Capillary 135 (*) 70 - 99 mg/dL  GLUCOSE, CAPILLARY     Status: None   Collection Time    10/16/12  5:08 PM      Result Value Range   Glucose-Capillary 70  70 - 99 mg/dL  GLUCOSE, CAPILLARY     Status: None   Collection Time    10/16/12 10:25 PM      Result Value Range   Glucose-Capillary 93  70 - 99 mg/dL   Comment 1 Documented in Chart     Comment 2 Notify RN    BODY FLUID CELL COUNT WITH DIFFERENTIAL     Status: Abnormal   Collection Time    10/17/12  3:27 PM      Result  Value Range   Fluid Type-FCT ASCITIC     Comment: ABDOMEN     CORRECTED ON 05/05 AT 1539:  PREVIOUSLY REPORTED AS ASCITIC   Color, Fluid RED (*) YELLOW   Appearance, Fluid TURBID (*) CLEAR   WBC, Fluid 259  0 - 1000 cu mm   Neutrophil Count, Fluid 0  0 - 25 %   Lymphs, Fluid 9     Monocyte-Macrophage-Serous Fluid 91 (*) 50 - 90 %   Eos, Fluid 0     Other Cells, Fluid 0    BODY FLUID CULTURE     Status: None   Collection Time    10/17/12  3:27 PM      Result Value Range   Specimen Description FLUID ASCITIC ABDOMEN     Special Requests FLUID     Gram Stain       Value: RARE WBC PRESENT, PREDOMINANTLY MONONUCLEAR     NO ORGANISMS SEEN   Culture NO GROWTH 1 DAY     Report Status PENDING    BASIC METABOLIC PANEL     Status: Abnormal   Collection Time    10/18/12  6:00 AM      Result Value Range   Sodium 136  135 - 145 mEq/L   Potassium 3.4 (*) 3.5 - 5.1 mEq/L   Chloride 99  96 - 112 mEq/L   CO2 30  19 - 32 mEq/L   Glucose, Bld 72  70 - 99 mg/dL   BUN 13  6 - 23 mg/dL   Creatinine, Ser 4.78 (*) 0.50 - 1.10 mg/dL   Calcium 8.4  8.4 - 29.5 mg/dL   GFR calc non Af Amer 39 (*) >90 mL/min   GFR calc Af Amer 45 (*) >90 mL/min   Comment:            The eGFR has been calculated     using the CKD EPI equation.     This calculation has not been     validated in all clinical     situations.     eGFR's persistently     <90 mL/min signify     possible Chronic Kidney Disease.  CBC     Status: Abnormal   Collection Time    10/18/12  8:06 AM      Result Value Range   WBC 6.0  4.0 - 10.5 K/uL   RBC 3.32 (*) 3.87 - 5.11 MIL/uL   Hemoglobin 9.8 (*) 12.0 - 15.0 g/dL   HCT 62.1 (*) 30.8 - 65.7 %   MCV 91.0  78.0 - 100.0 fL   MCH 29.5  26.0 - 34.0 pg   MCHC 32.5  30.0 - 36.0 g/dL   RDW 84.6 (*) 96.2 - 95.2 %   Platelets 112 (*) 150 - 400 K/uL   Comment: CONSISTENT WITH PREVIOUS RESULT  RENAL FUNCTION PANEL     Status: Abnormal   Collection Time    10/18/12  8:06 AM      Result Value Range   Sodium 135  135 - 145 mEq/L   Potassium 3.2 (*) 3.5 - 5.1 mEq/L   Chloride  98  96 - 112 mEq/L   CO2 30  19 - 32 mEq/L   Glucose, Bld 130 (*) 70 - 99 mg/dL   BUN 14  6 - 23 mg/dL   Creatinine, Ser 8.41 (*) 0.50 - 1.10 mg/dL   Calcium 8.3 (*) 8.4 - 10.5 mg/dL   Phosphorus 1.8 (*) 2.3 -  4.6 mg/dL   Albumin 2.1 (*) 3.5 - 5.2 g/dL   GFR calc non Af Amer 36 (*) >90 mL/min   GFR calc Af Amer 42 (*) >90 mL/min   Comment:            The eGFR has been calculated     using the CKD EPI equation.     This calculation has not been     validated in all clinical     situations.     eGFR's persistently     <90 mL/min signify     possible Chronic Kidney Disease.     HPI   89 YOAAF just discharged 10/13/12 after episode of Sustained VT with cardioversion in ER and NSTEMI with cardiac cath revealing progression of CAD but without true culprit lesion. Pt does have BiVICD-  . Her medications were adjusted and she was discharged 10/13/12 at her insistence. Her dialysis was changed from T/Th/Sat to M/W/Fr. With her last dialysis on Thursday. Pt called yesterday with SOB and asking for oxygen. She was instructed to return to ER for continued SOB. She has had to sit up to breathe at home. DOE any exertion.   presented with  troponin of 1.22 most likely continued decrease in her pk troponin of 15 on recent admit but will follow. K+ of 6.0. Pro BNP >70,000 EKG SR without acute changes and chronic LBBB. Still with SOB but also complains of "pancreatic pain". Also complains of sacral pain.  Other cardiac history: 2008 underwent CABG surgery x 4. She also has had recurrent pancreatitis with pancreatic pseudocyst, history of esophageal varices at [facility], cirrhosis, as well as CHF. She has end-stage renal disease, on dialysis. Apparently in 2008, she was found have a stenosis in the distal aspect of her LIMA graft and apparently underwent stenting of her native LAD. It was felt that her graft to her diagonal was occluded at that time. Apparently, the patient is also status post ICD at the [facility].  She was admitted to [facility] in November of 2013 for a NSTEMI and underwent a LHC by Dr. Tresa Endo,  Her EF was 20-25%. Cath 10/11/12 revealed POS  HOSPITAL COURSE:    Pulmonary edema secondary to Acute systolic congestive heart failure/Chronic systolic congestive heart failure, NYHA class 3/ Ischemic cardiomyopathy, EF now 10% by cath (05/06/12)  secondary to Acute systolic congestive heart failure/Chronic systolic congestive heart failure, NYHA class 3/ Ischemic cardiomyopathy, EF now 10% by cath 05/06/12 - Pulmonary edema on CXR with 3+ pitting LE edema.  On coreg.  Continue aspirin.  -   palliative care consultation completed, the patient is a full code currently and wants all medical intervention.  s/p cardioversion on 5/1, Cardiac cath and ICD reprogrammed. Troponin positive but decreasing, EF of 10%; on amiodarone, low dose coreg bid.  SOB has improved significantly. Telemetry shows paced rhythm. She has been hypotensive but fairly asymptomatic.      ESRD on hemodialysis/Hyperkalemic syndrome:  - She was given calcium by the ED physician, have consulted renal for emergent dialysis. K now 3.2  usual HD TTS- changing to [facility], need with d/w them about 4x/week schedule and if that is possible     Abdominal pain:  Complex ascites. In the pelvis this appears loculated and is  compressing the sigmoid colon, bladder and displacing small bowel from the pelvis.  , she has remained afebrile, relates no diarrhea, she is exquisitely tender in her left lower quadrant no  rebound or guarding.  - Continue her Dilaudid and methadone.  Successful US guided paracentesis from LLQ.  Yielded of cloudy bloody ascitic fluid.ANC <250 Improved pain after paracentesis     Hypertension:  -Pt with ongoing volume overload and diffiicult with UF on HD due to hypotension  Pt is willing to consider 4 x week HD On coreg, amiodarone  Consider Midodrine pre-hd. She will need 4  time a week and needs to have this in place prior to discharge to avoid rehosp     Pancreatitis, chronic:  - Continue current home dose of narcotics.     ICD (implantable cardiac defibrillator) in place, MDT placed Jan 2013 [facility] consult cardiology for further testing.     Anemia of chronic disease:  - Prerenal, monitor hemoglobin as she does have esophageal varices.   platelets are 112. Hg 9.8       Discharge Exam:  Blood pressure 109/70, pulse 69, temperature 97.6 F (36.4 C), temperature source Oral, resp. rate 20, height 5\' 4"  (1.626 m), weight 41.8 kg (92 lb 2.4 oz), SpO2 98.00%.  HEENT:normocephalic, sclera clear  Neck:supple with JVD to the jaw line, no carotid bruits  Heart:S1S2 RRR no gallup no rub, soft systolic murmur  Lungs:+ rales throughout, no wheezes  Abd:+ BS, soft, non tender  Sacrum without ulcers, + hemorrhoids  Ext:2+ pitting edema to the knees  Neuro:alert and oriented. MAE, follows commands, + facial symmetry         Signed: Arial Galligan 10/18/2012, 10:17 AM

## 2012-10-18 NOTE — Progress Notes (Signed)
OT Cancellation Note  Patient Details Name: [name] MRN: [id number] DOB: 13-Jan-1951   Cancelled Treatment:    Reason Eval/Treat Not Completed: Patient at procedure or test/ unavailable--in hemodialysis. Will try to see later today if schedule allows, otherwise it will be tomorrow.  Evette Georges [phone] 10/18/2012, 8:58 AM

## 2012-10-18 NOTE — Progress Notes (Signed)
Palliative Medicine Team SW Psychosocial follow up with pt, dtr and other relative. [name] in bright spirits, eager to d/c home. Pt expressed her understanding about her need for increased h/d sessions/wk. Offered emotional support to pt and family. Also provided paperwork for dtr to request time off to continue helping mother settle in post-discharge. Pt and family appreciative of support, available as needed.   Kennieth Francois, LCSWA PMT Phone [phone] Pager [phone]

## 2012-10-18 NOTE — Care Management Note (Signed)
   CARE MANAGEMENT NOTE 10/18/2012  Patient:  [name], [name]   Account Number:  [id number]  Date Initiated:  10/18/2012  Documentation initiated by:  Susumu Hackler  Subjective/Objective Assessment:   Request for home oxygen due to increase respiratory effort with ambulation and resume HH services.     Action/Plan:   Met with pt who is active with [facility], will ask to resume HH services, pt oxygen saturations do not qualify pt for home oxygen.   Anticipated DC Date:  10/18/2012   Anticipated DC Plan:  HOME W HOME HEALTH SERVICES         [facility] Choice  HOME HEALTH   Choice offered to / List presented to:  C-1 Patient        HH arranged  HH-1 RN  HH-2 PT  HH-4 NURSE'S AIDE      HH agency  [facility]   Status of service:  Completed, signed off Medicare Important Message given?   (If response is "NO", the following Medicare IM given date fields will be blank) Date Medicare IM given:   Date Additional Medicare IM given:    Discharge Disposition:  HOME W HOME HEALTH SERVICES  Per UR Regulation:    If discussed at Long Length of Stay Meetings, dates discussed:    Comments:  10/18/2012  Pt wishes to resume [facility] services with [facility], orders entered. Pt also wishes to have home oxygen due to increased work of breathing with ambulation, however oxygen saturations did not drop. Pt given prescription for home oxygen that she will take to the [place], as she states that she will be able to get oxygen from the [place]. Recorded O2 saturations do not qualify this pt for home oxygen at this time. [facility] notified of need to resume HH services. Johny Shock RN MPH Case Manager [phone]

## 2012-10-18 NOTE — Progress Notes (Signed)
SATURATION QUALIFICATIONS: (This note is used to comply with regulatory documentation for home oxygen)  Patient Saturations on Room Air at Rest = 100%  Patient Saturations on Room Air while Ambulating = 100%  Patient Saturations on 0 Liters of oxygen while Ambulating = 100%   

## 2012-10-21 LAB — BODY FLUID CULTURE: Culture: NO GROWTH

## 2012-10-25 ENCOUNTER — Ambulatory Visit (INDEPENDENT_AMBULATORY_CARE_PROVIDER_SITE_OTHER): Payer: Non-veteran care | Admitting: Cardiology

## 2012-10-25 ENCOUNTER — Telehealth: Payer: Self-pay | Admitting: Cardiovascular Disease

## 2012-10-25 ENCOUNTER — Encounter: Payer: Self-pay | Admitting: *Deleted

## 2012-10-25 ENCOUNTER — Encounter: Payer: Self-pay | Admitting: Cardiology

## 2012-10-25 VITALS — BP 80/60 | HR 68 | Ht 64.0 in | Wt 102.0 lb

## 2012-10-25 DIAGNOSIS — N186 End stage renal disease: Secondary | ICD-10-CM

## 2012-10-25 DIAGNOSIS — I959 Hypotension, unspecified: Secondary | ICD-10-CM

## 2012-10-25 DIAGNOSIS — I2589 Other forms of chronic ischemic heart disease: Secondary | ICD-10-CM

## 2012-10-25 DIAGNOSIS — I255 Ischemic cardiomyopathy: Secondary | ICD-10-CM

## 2012-10-25 DIAGNOSIS — Z992 Dependence on renal dialysis: Secondary | ICD-10-CM

## 2012-10-25 DIAGNOSIS — I214 Non-ST elevation (NSTEMI) myocardial infarction: Secondary | ICD-10-CM

## 2012-10-25 DIAGNOSIS — I8510 Secondary esophageal varices without bleeding: Secondary | ICD-10-CM

## 2012-10-25 DIAGNOSIS — K7460 Unspecified cirrhosis of liver: Secondary | ICD-10-CM

## 2012-10-25 DIAGNOSIS — I2510 Atherosclerotic heart disease of native coronary artery without angina pectoris: Secondary | ICD-10-CM

## 2012-10-25 MED ORDER — AMIODARONE HCL 200 MG PO TABS: 200.0000 mg | ORAL_TABLET | Freq: Two times a day (BID) | ORAL | Status: AC

## 2012-10-25 NOTE — Assessment & Plan Note (Signed)
On Ranexa, coreg. No angina

## 2012-10-25 NOTE — Progress Notes (Signed)
10/25/2012 [name]   Aug 08, 1950  [id number]  Primary Physicia ARMOUR, Theresia Bough, MD Primary Cardiologist: Dr Tresa Endo  HPI:  Frail 62y/o female we have seen in the hospital with end stage CM and severe CAD. She has a history of CABG X 4 in 2/08 and had a stent in 6/08. She has as severe CM and had a BiV ICD implanted Jan 2012 (MDT). She had a NSTEMI in April of this year in the setting of slow VT. She had to be cardioverted in the ER. Cath showed distal disease and she has been treated medically. She was discharged 10/13/12 and re admitted 10/15/12 with CHF. She is a dialysis pt and her dialysis was adjusted. She was also seen by Palliative Care. We contacted her by phone 10/18/12. She says the [place] will not fill her Amiodarone ?????. She also was supposed to have home O2 and we sent an x to [facility]. She tells me she is doing OK today, no unusual SOB. She is now on [facility]TH[facility] dialysis.    Current Outpatient Prescriptions  Medication Sig Dispense Refill  . amiodarone (PACERONE) 200 MG tablet Take 1 tablet (200 mg total) by mouth 2 (two) times daily.  60 tablet  5  . aspirin EC 81 MG EC tablet Take 1 tablet (81 mg total) by mouth daily.  30 tablet  0  . carvedilol (COREG) 3.125 MG tablet Take 1.5 tablets (4.6875 mg total) by mouth 2 (two) times daily with a meal.  90 tablet  5  . cinacalcet (SENSIPAR) 30 MG tablet Take 2 tablets (60 mg total) by mouth daily with breakfast.  30 tablet  0  . docusate sodium 100 MG CAPS Take 100 mg by mouth 2 (two) times daily.      [name] HYDROmorphone (DILAUDID) 2 MG tablet Take 2 tablets (4 mg total) by mouth every 6 (six) hours as needed (severe pain.). For pain  30 tablet  0  . methadone (DOLOPHINE) 5 MG tablet Take 1 tablet (5 mg total) by mouth 4 (four) times daily.  20 tablet  0  . multivitamin (RENA-VIT) TABS tablet Take 1 tablet by mouth daily.      . nitroGLYCERIN (NITROSTAT) 0.4 MG SL tablet Place 1 tablet (0.4 mg total) under the tongue every 5 (five) minutes as  needed for chest pain.  25 tablet  12  . Nutritional Supplements (FEEDING SUPPLEMENT, NEPRO CARB STEADY,) LIQD Take 237 mLs by mouth 2 (two) times daily with breakfast and lunch.      . pantoprazole (PROTONIX) 40 MG tablet Take 40 mg by mouth daily.      . pravastatin (PRAVACHOL) 20 MG tablet Take 20 mg by mouth at bedtime.       . ranolazine (RANEXA) 500 MG 12 hr tablet Take 500 mg by mouth daily.       . sevelamer (RENAGEL) 400 MG tablet Take 1 tablet (400 mg total) by mouth 3 (three) times daily with meals. With a meal  90 tablet  0   No current facility-administered medications for this visit.    Allergies  Allergen Reactions  . Codeine Itching  . Morphine And Related Shortness Of Breath    SOB when given IV once "to me it was mild; I called out fast for help; never had to intubate" (05/23/2012)  . Ace Inhibitors Other (See Comments)    Unknown reaction but her physician stated that she can't take it (specifically lisinopril)  . Tylenol (Acetaminophen) Other (  See Comments)    Patient states that the doctor told her she has a Fatty Liver.    History   Social History  . Marital Status: Single    Spouse Name: N/A    Number of Children: N/A  . Years of Education: N/A   Occupational History  . Not on file.   Social History Main Topics  . Smoking status: Former Smoker -- 0.33 packs/day for 31 years    Types: Cigarettes    Quit date: 06/15/2000  . Smokeless tobacco: Never Used  . Alcohol Use: No     Comment: 05/23/2012 "drank from age [age]; never had problem w/it"  . Drug Use: No  . Sexually Active: Yes    Birth Control/ Protection: Post-menopausal   Other Topics Concern  . Not on file   Social History Narrative   The patient lives alone in [place].  She has a fiance.  She is a retired nurse-Psychiatric and ortho.  She is disabled and receives most of her care from the [place], [place].  She was a Charity fundraiser in [employer].  She has 2 kids (boy, girl).  Denies alcohol, cigarettes,  drugs.  She is on Methadone via Palliative Care.        Son-shawn-[phone]      Lives alone     Review of Systems: General: negative for chills, fever, night sweats or weight changes.  Cardiovascular: negative for chest pain, dyspnea on exertion, edema, orthopnea, palpitations, paroxysmal nocturnal dyspnea or shortness of breath Dermatological: negative for rash Respiratory: negative for cough or wheezing Urologic: negative for hematuria Abdominal: negative for nausea, vomiting, diarrhea, bright red blood per rectum, melena, or hematemesis Neurologic: negative for visual changes, syncope, or dizziness All other systems reviewed and are otherwise negative except as noted above.    Blood pressure 80/60, pulse 68, height 5\' 4"  (1.626 m), weight 102 lb (46.267 kg).  General appearance: alert, cooperative, cachectic, no distress and thin, frail Lungs: decreased at breath sounds Heart: regular rate and rhythm and soft systolic murmur  EKG  EKG: NSR, V paced.  ASSESSMENT AND PLAN:   Ischemic cardiomyopathy, EF 20-25% echo March 2014, 10% by cath (05/06/12) Currently compensated but end stage and frail.   CAD, CABG X 4 Feb '08, LAD stent June '08- cath 4/14 - med Rx On Ranexa, coreg. No angina  Esophageal varices in cirrhosis, admitted June 2013 to [facility] She had paracentesis during her last admission  NSTEMI in setting of slow VT April 2014- cath - med Rx No further ICD discharges  Hypotension She tolerates this well    PLAN  She is a TCM early follow up. We will try and get her Amiodarone refilled. We will contact [facility] about her O2.   Norene Oliveri KPA-C 10/25/2012 2:40 PM

## 2012-10-25 NOTE — Assessment & Plan Note (Signed)
No further ICD discharges

## 2012-10-25 NOTE — Assessment & Plan Note (Signed)
She had paracentesis during her last admission

## 2012-10-25 NOTE — Assessment & Plan Note (Signed)
She tolerates this well

## 2012-10-25 NOTE — Assessment & Plan Note (Signed)
Currently compensated but end stage and frail.

## 2012-10-25 NOTE — Telephone Encounter (Signed)
The pt has [employer] go through [place] for oxygen!

## 2012-10-25 NOTE — Patient Instructions (Signed)
If there is any question please feel free to call and asked for Scarlette Hogston [phone]  Follow up appt with Dr Tresa Endo in 6 Weeks.

## 2012-10-26 NOTE — Telephone Encounter (Signed)
Clydie Braun called back.  Stated pt has [facility] benefits and [facility] cannot provide O2 for pt.   RN called pt and informed.  Pt stated she goes to [place] in [place].  RN attempted to contact [facility] in [place] w/o success.  Call to pt and asked if she wanted to pickup Rx or have it mailed so she can take it to the [place].  Pt would like Rx mailed.  Informed it would be mailed.  Pt verbalized understanding and agreed w/ plan.  Rx mailed.

## 2012-10-26 NOTE — Telephone Encounter (Signed)
Returned call. Left message to call back.

## 2012-10-27 ENCOUNTER — Telehealth: Payer: Self-pay | Admitting: Cardiovascular Disease

## 2012-10-27 NOTE — Telephone Encounter (Signed)
none

## 2012-10-27 NOTE — Telephone Encounter (Signed)
Orders received from [facility] for Dr. Tresa Endo to sign and fax back to [phone].

## 2012-11-02 ENCOUNTER — Encounter (HOSPITAL_COMMUNITY): Payer: Self-pay | Admitting: Emergency Medicine

## 2012-11-02 ENCOUNTER — Inpatient Hospital Stay (HOSPITAL_COMMUNITY)
Admission: EM | Admit: 2012-11-02 | Discharge: 2012-11-07 | DRG: 391 | Disposition: A | Discharge status: Home / Self Care | Payer: Medicare Other | Attending: Internal Medicine | Admitting: Internal Medicine

## 2012-11-02 ENCOUNTER — Emergency Department (HOSPITAL_COMMUNITY): Payer: Medicare Other

## 2012-11-02 DIAGNOSIS — K7689 Other specified diseases of liver: Secondary | ICD-10-CM | POA: Diagnosis present

## 2012-11-02 DIAGNOSIS — K5900 Constipation, unspecified: Secondary | ICD-10-CM | POA: Diagnosis present

## 2012-11-02 DIAGNOSIS — R109 Unspecified abdominal pain: Principal | ICD-10-CM

## 2012-11-02 DIAGNOSIS — D509 Iron deficiency anemia, unspecified: Secondary | ICD-10-CM | POA: Diagnosis present

## 2012-11-02 DIAGNOSIS — K861 Other chronic pancreatitis: Secondary | ICD-10-CM

## 2012-11-02 DIAGNOSIS — R509 Fever, unspecified: Secondary | ICD-10-CM

## 2012-11-02 DIAGNOSIS — I5021 Acute systolic (congestive) heart failure: Secondary | ICD-10-CM

## 2012-11-02 DIAGNOSIS — I2589 Other forms of chronic ischemic heart disease: Secondary | ICD-10-CM | POA: Diagnosis present

## 2012-11-02 DIAGNOSIS — I509 Heart failure, unspecified: Secondary | ICD-10-CM | POA: Diagnosis present

## 2012-11-02 DIAGNOSIS — Z87891 Personal history of nicotine dependence: Secondary | ICD-10-CM

## 2012-11-02 DIAGNOSIS — I471 Supraventricular tachycardia, unspecified: Secondary | ICD-10-CM

## 2012-11-02 DIAGNOSIS — R112 Nausea with vomiting, unspecified: Secondary | ICD-10-CM | POA: Diagnosis present

## 2012-11-02 DIAGNOSIS — R188 Other ascites: Secondary | ICD-10-CM

## 2012-11-02 DIAGNOSIS — D729 Disorder of white blood cells, unspecified: Secondary | ICD-10-CM

## 2012-11-02 DIAGNOSIS — R630 Anorexia: Secondary | ICD-10-CM

## 2012-11-02 DIAGNOSIS — R0602 Shortness of breath: Secondary | ICD-10-CM

## 2012-11-02 DIAGNOSIS — Z992 Dependence on renal dialysis: Secondary | ICD-10-CM

## 2012-11-02 DIAGNOSIS — K7460 Unspecified cirrhosis of liver: Secondary | ICD-10-CM | POA: Diagnosis present

## 2012-11-02 DIAGNOSIS — I472 Ventricular tachycardia, unspecified: Secondary | ICD-10-CM

## 2012-11-02 DIAGNOSIS — K631 Perforation of intestine (nontraumatic): Secondary | ICD-10-CM

## 2012-11-02 DIAGNOSIS — D72829 Elevated white blood cell count, unspecified: Secondary | ICD-10-CM

## 2012-11-02 DIAGNOSIS — Z951 Presence of aortocoronary bypass graft: Secondary | ICD-10-CM

## 2012-11-02 DIAGNOSIS — Z22322 Carrier or suspected carrier of Methicillin resistant Staphylococcus aureus: Secondary | ICD-10-CM

## 2012-11-02 DIAGNOSIS — N2581 Secondary hyperparathyroidism of renal origin: Secondary | ICD-10-CM | POA: Diagnosis present

## 2012-11-02 DIAGNOSIS — I2510 Atherosclerotic heart disease of native coronary artery without angina pectoris: Secondary | ICD-10-CM

## 2012-11-02 DIAGNOSIS — Z681 Body mass index (BMI) 19 or less, adult: Secondary | ICD-10-CM

## 2012-11-02 DIAGNOSIS — R1012 Left upper quadrant pain: Secondary | ICD-10-CM

## 2012-11-02 DIAGNOSIS — I5022 Chronic systolic (congestive) heart failure: Secondary | ICD-10-CM

## 2012-11-02 DIAGNOSIS — J189 Pneumonia, unspecified organism: Secondary | ICD-10-CM

## 2012-11-02 DIAGNOSIS — I5023 Acute on chronic systolic (congestive) heart failure: Secondary | ICD-10-CM

## 2012-11-02 DIAGNOSIS — I255 Ischemic cardiomyopathy: Secondary | ICD-10-CM

## 2012-11-02 DIAGNOSIS — Z66 Do not resuscitate: Secondary | ICD-10-CM | POA: Diagnosis present

## 2012-11-02 DIAGNOSIS — I495 Sick sinus syndrome: Secondary | ICD-10-CM

## 2012-11-02 DIAGNOSIS — Z7982 Long term (current) use of aspirin: Secondary | ICD-10-CM

## 2012-11-02 DIAGNOSIS — N186 End stage renal disease: Secondary | ICD-10-CM

## 2012-11-02 DIAGNOSIS — K862 Cyst of pancreas: Secondary | ICD-10-CM | POA: Diagnosis present

## 2012-11-02 DIAGNOSIS — E43 Unspecified severe protein-calorie malnutrition: Secondary | ICD-10-CM

## 2012-11-02 DIAGNOSIS — E1169 Type 2 diabetes mellitus with other specified complication: Secondary | ICD-10-CM | POA: Diagnosis present

## 2012-11-02 DIAGNOSIS — E875 Hyperkalemia: Secondary | ICD-10-CM

## 2012-11-02 DIAGNOSIS — I959 Hypotension, unspecified: Secondary | ICD-10-CM

## 2012-11-02 DIAGNOSIS — I10 Essential (primary) hypertension: Secondary | ICD-10-CM

## 2012-11-02 DIAGNOSIS — I519 Heart disease, unspecified: Secondary | ICD-10-CM

## 2012-11-02 DIAGNOSIS — E871 Hypo-osmolality and hyponatremia: Secondary | ICD-10-CM

## 2012-11-02 DIAGNOSIS — E162 Hypoglycemia, unspecified: Secondary | ICD-10-CM

## 2012-11-02 DIAGNOSIS — I252 Old myocardial infarction: Secondary | ICD-10-CM

## 2012-11-02 DIAGNOSIS — Z515 Encounter for palliative care: Secondary | ICD-10-CM

## 2012-11-02 DIAGNOSIS — K922 Gastrointestinal hemorrhage, unspecified: Secondary | ICD-10-CM

## 2012-11-02 DIAGNOSIS — Z79899 Other long term (current) drug therapy: Secondary | ICD-10-CM

## 2012-11-02 DIAGNOSIS — I447 Left bundle-branch block, unspecified: Secondary | ICD-10-CM

## 2012-11-02 DIAGNOSIS — Z95810 Presence of automatic (implantable) cardiac defibrillator: Secondary | ICD-10-CM

## 2012-11-02 DIAGNOSIS — I8510 Secondary esophageal varices without bleeding: Secondary | ICD-10-CM

## 2012-11-02 DIAGNOSIS — IMO0002 Reserved for concepts with insufficient information to code with codable children: Secondary | ICD-10-CM

## 2012-11-02 DIAGNOSIS — I214 Non-ST elevation (NSTEMI) myocardial infarction: Secondary | ICD-10-CM

## 2012-11-02 DIAGNOSIS — D631 Anemia in chronic kidney disease: Secondary | ICD-10-CM

## 2012-11-02 LAB — COMPREHENSIVE METABOLIC PANEL
ALT: 21 U/L (ref 0–35)
Alkaline Phosphatase: 188 U/L — ABNORMAL HIGH (ref 39–117)
GFR calc Af Amer: 23 mL/min — ABNORMAL LOW (ref >90)
Glucose, Bld: 50 mg/dL — ABNORMAL LOW (ref 70–99)
Potassium: 4.3 mEq/L (ref 3.5–5.1)
Sodium: 138 mEq/L (ref 135–145)
Total Protein: 6.4 g/dL (ref 6.0–8.3)

## 2012-11-02 LAB — CBC WITH DIFFERENTIAL/PLATELET
Basophils Relative: 0 % (ref 0–1)
Eosinophils Absolute: 0 10*3/uL (ref 0.0–0.7)
Eosinophils Relative: 0 % (ref 0–5)
HCT: 37.1 % (ref 36.0–46.0)
Lymphocytes Relative: 6 % — ABNORMAL LOW (ref 12–46)
Monocytes Relative: 4 % (ref 3–12)
Neutrophils Relative %: 90 % — ABNORMAL HIGH (ref 43–77)

## 2012-11-02 MED ORDER — SODIUM CHLORIDE 0.9 % IV BOLUS (SEPSIS)
500.0000 mL | Freq: Once | INTRAVENOUS | Status: AC
  Administered 2012-11-03: 500 mL via INTRAVENOUS

## 2012-11-02 MED ORDER — ONDANSETRON HCL 4 MG/2ML IJ SOLN
4.0000 mg | Freq: Once | INTRAMUSCULAR | Status: AC
  Administered 2012-11-03: 4 mg via INTRAVENOUS
  Filled 2012-11-02: qty 2

## 2012-11-02 MED ORDER — HYDROMORPHONE HCL PF 1 MG/ML IJ SOLN
1.0000 mg | Freq: Once | INTRAMUSCULAR | Status: AC
  Administered 2012-11-03: 0.5 mg via INTRAVENOUS
  Filled 2012-11-02: qty 1

## 2012-11-02 NOTE — ED Notes (Signed)
PT. REPORTS LOW ABDOMINAL PAIN WITH NAUSEA AND DIARRHEA ONSET TODAY , HEMODIALYSIS Q MON/WED/FRI , HYPOTENSIVE AT TRIAGE .

## 2012-11-02 NOTE — ED Notes (Signed)
IV team called to start site

## 2012-11-02 NOTE — ED Provider Notes (Signed)
History     CSN: [id number]  Arrival date & time 11/02/12  2123   First MD Initiated Contact with Patient 11/02/12 2215      Chief Complaint  Patient presents with  . Abdominal Pain    (Consider location/radiation/quality/duration/timing/severity/associated sxs/prior treatment) HPI Comments: 62 year old female, history of end-stage renal disease, chronic pain, acid reflux and history of pancreatitis who presents with a complaint of abdominal pain, nausea and diarrhea today. She does have a history of requiring dialysis Monday Wednesday and Friday and did complete her dialysis session today. After dialysis finished the patient notes that she became severely tender in her abdomen felt distended, has been burping excessively and has not had any gas or bowel movements. She has a history of abdominal surgery as well as cardiac surgery, has had a cholecystectomy, appendectomy, coronary bypass grafting as well as stent placement. She at one time used peritoneal dialysis but no longer does. She has a port in her right upper chest with a dialysis catheter where she receives her dialysis.  Patient is a 62 y.o. female presenting with abdominal pain. The history is provided by the patient.  Abdominal Pain Associated symptoms include abdominal pain.    Past Medical History  Diagnosis Date  . Coronary artery disease     s/p CABG 2008 with multiple PCIs  . Ischemic cardiomyopathy     Cath 04/2012, significant calcifications  . History of nonadherence to medical treatment   . Systolic CHF     91/4782 echo EF 10%  . Esophageal varices   . Cirrhosis     hx of fatty liver per patient, has known ascites with repeated paracenteses at [facility] as of 2013, also +hx of esoph varices with banding in the past  . Pacemaker   . ICD (implantable cardiac defibrillator) in place   . Hypertension     "used to have HTN; now I'm low" (05/23/2012)  . NSTEMI (non-ST elevated myocardial infarction)     04/2012; Trop  peaked to 1.39; 05/23/2012 pt denies ever having MI  . Pneumonia 02/2012    "first time ever" (05/23/2012)  . Chronic bronchitis 1970's thru 2002    "went away when I stopped smoking in 2002" (05/23/2012)  . Diabetes mellitus     Diet controlled  . History of blood transfusion     "alot of them" (05/23/2012)  . Iron (Fe) deficiency anemia     "severe" (05/23/2012)  . Pancreatitis, chronic 10/04/2011    Had severe gallstone pancreatitis in May 2003, no surgery, treated medically then returned in June 2003 for cholecystectomy and cyst gastrostomy for pancreatic pseudocyst. In March 2013 was admitted for hemorrhage into psueodcyst. In April 2013 was treated at [facility] for polymicrobial bacteremia (fungal, MRSA and enterobacter) felt to be due to ruptured pseudocyst, treated medically. Admitted Jan 2014 with abd pain and had gas and fluid in pancreatic bed. She refused surgery ("would not make it") and had fluid aspirated by IR- gram stain showed yeast and cultures grew enterococcus species, sensitive to amp and vanc. She was seen by ID and discharged on IV vanc/fortaz with HD and po voriconazole.     [name] ESRD on hemodialysis 05/06/2012    T,Th,Sat HD by [facility] on [address]. Started hemodialysis around July 2013.    [name] CHF (congestive heart failure)   . Complication of vascular access for dialysis 07/13/2012    L arm AVG (placed by Dr. Lyda Perone @ [facility]) was ligated 1/6 after significant L  arm swelling secondary to L SCV stenosis and pacemaker; new access to be placed at right arm on 1/29 by Dr. Hollace Hayward    . NSTEMI (non-ST elevated myocardial infarction) 10/08/12    cardiac cath with no obvious acute lesion, medical therapy    Past Surgical History  Procedure Laterality Date  . Esophagogastroduodenoscopy  09/15/2011    Procedure: ESOPHAGOGASTRODUODENOSCOPY (EGD);  Surgeon: Theda Belfast, MD;  Location: [facility] ENDOSCOPY;  Service: Endoscopy;  Laterality: N/A;  . Cholecystectomy  2003  .  Tonsillectomy and adenoidectomy  1961  . Appendectomy  1973?  [name] Vaginal hysterectomy  1973?  [name] Tubal ligation  1972  . Dilation and curettage of uterus      "bunch from profuse bleeding in the 1970's" (05/23/2012)  . Coronary artery bypass graft  2008    CABG X4  . Coronary angioplasty with stent placement  2008    "1; day after CABG" (05/23/2012)  . Cardiac catheterization      "before 2008 and 3 wk ago" (05/23/2012)  . Insert / replace / remove pacemaker  06/2011    pacemaker ICD  . Cardiac defibrillator placement  06/2011  . Arteriovenous graft placement  03/2012    right antecub  . Insertion of dialysis catheter  ~ 10/2011    right chest  . Peritoneal catheter insertion  08/2011  . Peritoneal catheter removal  ~ 10/2011    Family History  Problem Relation Age of Onset  . Coronary artery disease Mother   . Hypertension Mother   . Diabetes Mother   . Diabetes Sister   . Anesthesia problems Neg Hx     History  Substance Use Topics  . Smoking status: Former Smoker -- 0.33 packs/day for 31 years    Types: Cigarettes    Quit date: 06/15/2000  . Smokeless tobacco: Never Used  . Alcohol Use: No     Comment: 05/23/2012 "drank from age [age]; never had problem w/it"    OB History   Grav Para Term Preterm Abortions TAB SAB Ect Mult Living                  Review of Systems  Gastrointestinal: Positive for abdominal pain.  All other systems reviewed and are negative.    Allergies  Codeine; Morphine and related; Ace inhibitors; and Tylenol  Home Medications   Current Outpatient Rx  Name  Route  Sig  Dispense  Refill  . amiodarone (PACERONE) 200 MG tablet   Oral   Take 1 tablet (200 mg total) by mouth 2 (two) times daily.   60 tablet   5   . aspirin EC 81 MG EC tablet   Oral   Take 1 tablet (81 mg total) by mouth daily.   30 tablet   0   . carvedilol (COREG) 3.125 MG tablet   Oral   Take 1.5 tablets (4.6875 mg total) by mouth 2 (two) times daily with a meal.    90 tablet   5   . cinacalcet (SENSIPAR) 30 MG tablet   Oral   Take 2 tablets (60 mg total) by mouth daily with breakfast.   30 tablet   0   . docusate sodium 100 MG CAPS   Oral   Take 100 mg by mouth 2 (two) times daily.         [name] HYDROmorphone (DILAUDID) 2 MG tablet   Oral   Take 2 tablets (4 mg total) by mouth every 6 (six) hours as  needed (severe pain.). For pain   30 tablet   0   . methadone (DOLOPHINE) 5 MG tablet   Oral   Take 1 tablet (5 mg total) by mouth 4 (four) times daily.   20 tablet   0   . multivitamin (RENA-VIT) TABS tablet   Oral   Take 1 tablet by mouth daily.         . nitroGLYCERIN (NITROSTAT) 0.4 MG SL tablet   Sublingual   Place 1 tablet (0.4 mg total) under the tongue every 5 (five) minutes as needed for chest pain.   25 tablet   12   . Nutritional Supplements (FEEDING SUPPLEMENT, NEPRO CARB STEADY,) LIQD   Oral   Take 237 mLs by mouth 2 (two) times daily with breakfast and lunch.         . pantoprazole (PROTONIX) 40 MG tablet   Oral   Take 40 mg by mouth daily.         . pravastatin (PRAVACHOL) 20 MG tablet   Oral   Take 20 mg by mouth at bedtime.          . ranolazine (RANEXA) 500 MG 12 hr tablet   Oral   Take 500 mg by mouth daily.          . sevelamer (RENAGEL) 400 MG tablet   Oral   Take 1 tablet (400 mg total) by mouth 3 (three) times daily with meals. With a meal   90 tablet   0     BP 85/56  Pulse 78  Temp(Src) 97.6 F (36.4 C)  Resp 18  SpO2 97%  Physical Exam  Nursing note and vitals reviewed. Constitutional: She appears well-developed and well-nourished. No distress.  HENT:  Head: Normocephalic and atraumatic.  Mouth/Throat: Oropharynx is clear and moist. No oropharyngeal exudate.  Eyes: Conjunctivae and EOM are normal. Pupils are equal, round, and reactive to light. Right eye exhibits no discharge. Left eye exhibits no discharge. No scleral icterus.  Neck: Normal range of motion. Neck supple. No  JVD present. No thyromegaly present.  Cardiovascular: Normal rate, regular rhythm and intact distal pulses.  Exam reveals no gallop and no friction rub.   Murmur heard. Pulmonary/Chest: Effort normal and breath sounds normal. No respiratory distress. She has no wheezes. She has no rales.  Abdominal: Soft. Bowel sounds are normal. She exhibits distension. She exhibits no mass. There is tenderness.  Abdomen is tight, distended, tympanitic to percussion, diffusely tender with guarding.  Musculoskeletal: Normal range of motion. She exhibits edema ( Bilateral 2+ pitting edema). She exhibits no tenderness.  Lymphadenopathy:    She has no cervical adenopathy.  Neurological: She is alert. Coordination normal.  Skin: Skin is warm and dry. No rash noted. No erythema.  Psychiatric: She has a normal mood and affect. Her behavior is normal.    ED Course  Procedures (including critical care time)  Labs Reviewed  CBC WITH DIFFERENTIAL  COMPREHENSIVE METABOLIC PANEL  LACTIC ACID, PLASMA  LIPASE, BLOOD   No results found.   No diagnosis found.    MDM  The patient has diffuse abdominal tenderness, would consider abdominal perforation, possibly pancreatitis, possibly bowel obstruction as well. We'll obtain lactic acid in addition to other labs, IV fluids for hypotension and pain medications for her severe pain. There is no tachycardia or fever at this time.  No free air on plain film, VS improving slightly - pain meds given, fluids given, CT refused by pt - citing had  recent CT, AAS to r/o obstruction  Change of shift care signed out to Dr. Nicanor Alcon.      Vida Roller, MD 11/03/12 [phone]

## 2012-11-02 NOTE — ED Notes (Signed)
Called phlebotomy for blood draw

## 2012-11-03 ENCOUNTER — Inpatient Hospital Stay (HOSPITAL_COMMUNITY): Payer: Medicare Other

## 2012-11-03 ENCOUNTER — Encounter (HOSPITAL_COMMUNITY): Payer: Self-pay | Admitting: Radiology

## 2012-11-03 ENCOUNTER — Emergency Department (HOSPITAL_COMMUNITY): Payer: Medicare Other

## 2012-11-03 DIAGNOSIS — I2510 Atherosclerotic heart disease of native coronary artery without angina pectoris: Secondary | ICD-10-CM

## 2012-11-03 DIAGNOSIS — I509 Heart failure, unspecified: Secondary | ICD-10-CM

## 2012-11-03 DIAGNOSIS — J189 Pneumonia, unspecified organism: Secondary | ICD-10-CM | POA: Diagnosis present

## 2012-11-03 DIAGNOSIS — R109 Unspecified abdominal pain: Secondary | ICD-10-CM | POA: Diagnosis present

## 2012-11-03 DIAGNOSIS — R188 Other ascites: Secondary | ICD-10-CM | POA: Diagnosis present

## 2012-11-03 DIAGNOSIS — I5022 Chronic systolic (congestive) heart failure: Secondary | ICD-10-CM

## 2012-11-03 LAB — COMPREHENSIVE METABOLIC PANEL
AST: 21 U/L (ref 0–37)
Albumin: 1.4 g/dL — ABNORMAL LOW (ref 3.5–5.2)
BUN: 35 mg/dL — ABNORMAL HIGH (ref 6–23)
Calcium: 9.2 mg/dL (ref 8.4–10.5)
Chloride: 102 mEq/L (ref 96–112)
Creatinine, Ser: 2.68 mg/dL — ABNORMAL HIGH (ref 0.50–1.10)
Total Bilirubin: 0.3 mg/dL (ref 0.3–1.2)
Total Protein: 5.5 g/dL — ABNORMAL LOW (ref 6.0–8.3)

## 2012-11-03 LAB — CBC
HCT: 32.3 % — ABNORMAL LOW (ref 36.0–46.0)
Hemoglobin: 10.1 g/dL — ABNORMAL LOW (ref 12.0–15.0)
MCH: 28 pg (ref 26.0–34.0)
MCV: 89.5 fL (ref 78.0–100.0)
Platelets: 124 10*3/uL — ABNORMAL LOW (ref 150–400)
RBC: 3.61 MIL/uL — ABNORMAL LOW (ref 3.87–5.11)
WBC: 12.9 10*3/uL — ABNORMAL HIGH (ref 4.0–10.5)

## 2012-11-03 LAB — GLUCOSE, CAPILLARY
Glucose-Capillary: 139 mg/dL — ABNORMAL HIGH (ref 70–99)
Glucose-Capillary: 131 mg/dL — ABNORMAL HIGH (ref 70–99)
Glucose-Capillary: 109 mg/dL — ABNORMAL HIGH (ref 70–99)
Glucose-Capillary: 137 mg/dL — ABNORMAL HIGH (ref 70–99)
Glucose-Capillary: 94 mg/dL (ref 70–99)
Glucose-Capillary: 89 mg/dL (ref 70–99)
Glucose-Capillary: 70 mg/dL (ref 70–99)
Glucose-Capillary: 77 mg/dL (ref 70–99)
Glucose-Capillary: 63 mg/dL — ABNORMAL LOW (ref 70–99)
Glucose-Capillary: 72 mg/dL (ref 70–99)

## 2012-11-03 LAB — POCT I-STAT TROPONIN I

## 2012-11-03 LAB — POCT I-STAT, CHEM 8
BUN: 30 mg/dL — ABNORMAL HIGH (ref 6–23)
Chloride: 105 mEq/L (ref 96–112)
Sodium: 139 mEq/L (ref 135–145)

## 2012-11-03 LAB — AMMONIA: Ammonia: 33 umol/L (ref 11–60)

## 2012-11-03 MED ORDER — ONDANSETRON HCL 4 MG/2ML IJ SOLN: 4.0000 mg | Freq: Once | INTRAMUSCULAR | Status: AC

## 2012-11-03 MED ORDER — SEVELAMER CARBONATE 800 MG PO TABS
400.0000 mg | ORAL_TABLET | Freq: Three times a day (TID) | ORAL | Status: AC
  Administered 2012-11-04 (×3): 400 mg via ORAL
  Filled 2012-11-03 (×7): qty 0.5

## 2012-11-03 MED ORDER — VANCOMYCIN HCL IN DEXTROSE 1-5 GM/200ML-% IV SOLN
1000.0000 mg | Freq: Once | INTRAVENOUS | Status: DC
  Filled 2012-11-03: qty 200

## 2012-11-03 MED ORDER — RENA-VITE PO TABS
1.0000 | ORAL_TABLET | Freq: Every day | ORAL | Status: DC
  Administered 2012-11-03 – 2012-11-05 (×3): 1 via ORAL
  Filled 2012-11-03 (×3): qty 1

## 2012-11-03 MED ORDER — LACTULOSE 10 GM/15ML PO SOLN
10.0000 g | Freq: Two times a day (BID) | ORAL | Status: DC
  Administered 2012-11-03 – 2012-11-05 (×3): 10 g via ORAL
  Filled 2012-11-03 (×9): qty 15

## 2012-11-03 MED ORDER — ONDANSETRON HCL 4 MG/2ML IJ SOLN
INTRAMUSCULAR | Status: AC
  Administered 2012-11-03: 4 mg via INTRAVENOUS
  Filled 2012-11-03: qty 2

## 2012-11-03 MED ORDER — PANTOPRAZOLE SODIUM 40 MG PO TBEC
40.0000 mg | DELAYED_RELEASE_TABLET | Freq: Every day | ORAL | Status: DC
  Administered 2012-11-03 – 2012-11-05 (×3): 40 mg via ORAL
  Filled 2012-11-03 (×3): qty 1

## 2012-11-03 MED ORDER — DEXTROSE 10 % IV SOLN
INTRAVENOUS | Status: DC
  Administered 2012-11-03: 12:00:00 via INTRAVENOUS
  Administered 2012-11-04: 20 mL/h via INTRAVENOUS
  Administered 2012-11-06: 03:00:00 via INTRAVENOUS
  Filled 2012-11-03: qty 1000

## 2012-11-03 MED ORDER — CINACALCET HCL 30 MG PO TABS
60.0000 mg | ORAL_TABLET | Freq: Every day | ORAL | Status: DC
  Administered 2012-11-04 – 2012-11-05 (×2): 60 mg via ORAL
  Filled 2012-11-03 (×4): qty 2

## 2012-11-03 MED ORDER — BISACODYL 10 MG RE SUPP
10.0000 mg | Freq: Once | RECTAL | Status: DC
  Filled 2012-11-03: qty 1

## 2012-11-03 MED ORDER — ONDANSETRON HCL 4 MG/2ML IJ SOLN
4.0000 mg | Freq: Four times a day (QID) | INTRAMUSCULAR | Status: DC | PRN
  Administered 2012-11-06: 4 mg via INTRAVENOUS
  Filled 2012-11-03: qty 2

## 2012-11-03 MED ORDER — ACETAMINOPHEN 325 MG PO TABS: 650.0000 mg | ORAL_TABLET | Freq: Four times a day (QID) | ORAL | Status: DC | PRN

## 2012-11-03 MED ORDER — ASPIRIN EC 81 MG PO TBEC
81.0000 mg | DELAYED_RELEASE_TABLET | Freq: Every day | ORAL | Status: DC
  Administered 2012-11-03 – 2012-11-05 (×3): 81 mg via ORAL
  Filled 2012-11-03 (×5): qty 1

## 2012-11-03 MED ORDER — VANCOMYCIN HCL 500 MG IV SOLR
500.0000 mg | INTRAVENOUS | Status: DC
  Administered 2012-11-04: 500 mg via INTRAVENOUS
  Filled 2012-11-03 (×2): qty 500

## 2012-11-03 MED ORDER — DARBEPOETIN ALFA-POLYSORBATE 150 MCG/0.3ML IJ SOLN: 150.0000 ug | INTRAMUSCULAR | Status: DC

## 2012-11-03 MED ORDER — SODIUM CHLORIDE 0.9 % IJ SOLN
3.0000 mL | Freq: Two times a day (BID) | INTRAMUSCULAR | Status: DC
  Administered 2012-11-03 – 2012-11-05 (×3): 3 mL via INTRAVENOUS

## 2012-11-03 MED ORDER — CARVEDILOL 3.125 MG PO TABS
4.6875 mg | ORAL_TABLET | Freq: Two times a day (BID) | ORAL | Status: DC
  Administered 2012-11-03 – 2012-11-06 (×7): 4.6875 mg via ORAL
  Filled 2012-11-03 (×9): qty 1.5

## 2012-11-03 MED ORDER — DEXTROSE 5 % IV SOLN
2.0000 g | INTRAVENOUS | Status: DC
  Administered 2012-11-04: 2 g via INTRAVENOUS
  Filled 2012-11-03: qty 2

## 2012-11-03 MED ORDER — ONDANSETRON HCL 4 MG PO TABS: 4.0000 mg | ORAL_TABLET | Freq: Four times a day (QID) | ORAL | Status: DC | PRN

## 2012-11-03 MED ORDER — BOOST / RESOURCE BREEZE PO LIQD
1.0000 | Freq: Two times a day (BID) | ORAL | Status: DC
  Administered 2012-11-04 (×2): 1 via ORAL

## 2012-11-03 MED ORDER — HEPARIN SODIUM (PORCINE) 5000 UNIT/ML IJ SOLN
5000.0000 [IU] | Freq: Three times a day (TID) | INTRAMUSCULAR | Status: DC
  Administered 2012-11-03 – 2012-11-05 (×5): 5000 [IU] via SUBCUTANEOUS
  Filled 2012-11-03 (×16): qty 1

## 2012-11-03 MED ORDER — POLYETHYLENE GLYCOL 3350 17 G PO PACK
17.0000 g | PACK | Freq: Every day | ORAL | Status: DC
  Administered 2012-11-03 – 2012-11-05 (×3): 17 g via ORAL
  Filled 2012-11-03 (×5): qty 1

## 2012-11-03 MED ORDER — SIMVASTATIN 10 MG PO TABS
10.0000 mg | ORAL_TABLET | Freq: Every day | ORAL | Status: DC
  Administered 2012-11-03 – 2012-11-05 (×3): 10 mg via ORAL
  Filled 2012-11-03 (×4): qty 1

## 2012-11-03 MED ORDER — HYDROMORPHONE HCL PF 1 MG/ML IJ SOLN
1.0000 mg | INTRAMUSCULAR | Status: DC | PRN
  Administered 2012-11-03 – 2012-11-06 (×3): 1 mg via INTRAVENOUS
  Filled 2012-11-03 (×5): qty 1

## 2012-11-03 MED ORDER — RANOLAZINE ER 500 MG PO TB12
500.0000 mg | ORAL_TABLET | Freq: Every day | ORAL | Status: DC
  Administered 2012-11-03 – 2012-11-05 (×3): 500 mg via ORAL
  Filled 2012-11-03 (×5): qty 1

## 2012-11-03 MED ORDER — DEXTROSE 50 % IV SOLN
1.0000 | Freq: Once | INTRAVENOUS | Status: AC
  Administered 2012-11-03: 50 mL via INTRAVENOUS
  Filled 2012-11-03: qty 50

## 2012-11-03 MED ORDER — DARBEPOETIN ALFA-POLYSORBATE 150 MCG/0.3ML IJ SOLN
150.0000 ug | INTRAMUSCULAR | Status: DC
  Administered 2012-11-04: 150 ug via INTRAVENOUS
  Filled 2012-11-03: qty 0.3

## 2012-11-03 MED ORDER — METHADONE HCL 5 MG PO TABS
5.0000 mg | ORAL_TABLET | Freq: Four times a day (QID) | ORAL | Status: DC
  Administered 2012-11-03 – 2012-11-05 (×7): 5 mg via ORAL
  Filled 2012-11-03 (×9): qty 1

## 2012-11-03 MED ORDER — NITROGLYCERIN 0.4 MG SL SUBL: 0.4000 mg | SUBLINGUAL_TABLET | SUBLINGUAL | Status: DC | PRN

## 2012-11-03 MED ORDER — AMIODARONE HCL 200 MG PO TABS
200.0000 mg | ORAL_TABLET | Freq: Two times a day (BID) | ORAL | Status: DC
  Administered 2012-11-03 – 2012-11-05 (×5): 200 mg via ORAL
  Filled 2012-11-03 (×10): qty 1

## 2012-11-03 MED ORDER — LEVOFLOXACIN IN D5W 750 MG/150ML IV SOLN
750.0000 mg | Freq: Once | INTRAVENOUS | Status: AC
  Administered 2012-11-03: 750 mg via INTRAVENOUS
  Filled 2012-11-03: qty 150

## 2012-11-03 MED ORDER — SODIUM CHLORIDE 0.9 % IJ SOLN
3.0000 mL | Freq: Two times a day (BID) | INTRAMUSCULAR | Status: DC
  Administered 2012-11-04 – 2012-11-05 (×2): 3 mL via INTRAVENOUS

## 2012-11-03 MED ORDER — NEPRO/CARBSTEADY PO LIQD
237.0000 mL | Freq: Two times a day (BID) | ORAL | Status: DC
  Administered 2012-11-03 – 2012-11-06 (×7): 237 mL via ORAL

## 2012-11-03 MED ORDER — ACETAMINOPHEN 650 MG RE SUPP: 650.0000 mg | Freq: Four times a day (QID) | RECTAL | Status: DC | PRN

## 2012-11-03 MED ORDER — DEXTROSE 50 % IV SOLN: 50.0000 mL | Freq: Once | INTRAVENOUS | Status: DC

## 2012-11-03 MED ORDER — LEVOFLOXACIN IN D5W 500 MG/100ML IV SOLN: 500.0000 mg | INTRAVENOUS | Status: DC

## 2012-11-03 MED ORDER — VANCOMYCIN HCL IN DEXTROSE 1-5 GM/200ML-% IV SOLN
1000.0000 mg | Freq: Once | INTRAVENOUS | Status: AC
  Administered 2012-11-03: 1000 mg via INTRAVENOUS
  Filled 2012-11-03: qty 200

## 2012-11-03 MED ORDER — HYDROMORPHONE HCL 2 MG PO TABS
4.0000 mg | ORAL_TABLET | Freq: Four times a day (QID) | ORAL | Status: DC | PRN
  Administered 2012-11-03: 4 mg via ORAL
  Filled 2012-11-03: qty 2

## 2012-11-03 MED ORDER — PIPERACILLIN-TAZOBACTAM 3.375 G IVPB
3.3750 g | Freq: Once | INTRAVENOUS | Status: AC
  Administered 2012-11-03: 3.375 g via INTRAVENOUS
  Filled 2012-11-03: qty 50

## 2012-11-03 MED ORDER — DOCUSATE SODIUM 100 MG PO CAPS
100.0000 mg | ORAL_CAPSULE | Freq: Two times a day (BID) | ORAL | Status: DC
  Administered 2012-11-03 – 2012-11-05 (×4): 100 mg via ORAL
  Filled 2012-11-03 (×9): qty 1

## 2012-11-03 MED ORDER — DEXTROSE 50 % IV SOLN
INTRAVENOUS | Status: AC
  Administered 2012-11-03: 50 mL
  Filled 2012-11-03: qty 50

## 2012-11-03 MED ORDER — DEXTROSE 5 % IV SOLN
2.0000 g | Freq: Once | INTRAVENOUS | Status: AC
  Administered 2012-11-03: 2 g via INTRAVENOUS
  Filled 2012-11-03: qty 2

## 2012-11-03 MED ORDER — DEXTROSE 50 % IV SOLN
50.0000 mL | INTRAVENOUS | Status: DC | PRN
  Administered 2012-11-05: 50 mL via INTRAVENOUS

## 2012-11-03 NOTE — H&P (Signed)
[facility] History and Physical  [name]:[id number] DOB: [birth year] DOA: 11/02/2012  Referring physician: ER physician. PCP: Jyl Heinz, MD  Specialists: [facility].  Chief Complaint: Abdominal pain.  HPI: [name] is a 62 y.o. female with known history of ischemic cardiomyopathy, CAD status post CABG and stenting, ESRD on hemodialysis Monday Wednesday and Friday was brought to the ER patient has been having abdominal pain. Patient states that the pain has been present for the last 24 hours and is mostly in the lower quadrants and has been having nausea and vomiting. Denies any chest pain shortness of breath productive cough fever chills. CT abdomen and pelvis shows large ascites and there possible infiltrates in the lung for which patient has been started on antibiotics for health care associated pneumonia. In addition patient has been found to be hypoglycemic. For which D50 has been given and CBG has increased to 136. Patient will be admitted for further management. Patient last month was admitted for non-ST elevation MI and cardiac catheter was done and medical management was planned. Patient also recently was admitted for CHF decompensation.  Review of Systems: As presented in the history of presenting illness, rest negative.  Past Medical History  Diagnosis Date  . Coronary artery disease     s/p CABG 2008 with multiple PCIs  . Ischemic cardiomyopathy     Cath 04/2012, significant calcifications  . History of nonadherence to medical treatment   . Systolic CHF     57/8469 echo EF 10%  . Esophageal varices   . Cirrhosis     hx of fatty liver per patient, has known ascites with repeated paracenteses at [facility] as of 2013, also +hx of esoph varices with banding in the past  . Pacemaker   . ICD (implantable cardiac defibrillator) in place   . Hypertension     "used to have HTN; now I'm low" (05/23/2012)  . NSTEMI (non-ST elevated myocardial  infarction)     04/2012; Trop peaked to 1.39; 05/23/2012 pt denies ever having MI  . Pneumonia 02/2012    "first time ever" (05/23/2012)  . Chronic bronchitis 1970's thru 2002    "went away when I stopped smoking in 2002" (05/23/2012)  . Diabetes mellitus     Diet controlled  . History of blood transfusion     "alot of them" (05/23/2012)  . Iron (Fe) deficiency anemia     "severe" (05/23/2012)  . Pancreatitis, chronic 10/04/2011    Had severe gallstone pancreatitis in May 2003, no surgery, treated medically then returned in June 2003 for cholecystectomy and cyst gastrostomy for pancreatic pseudocyst. In March 2013 was admitted for hemorrhage into psueodcyst. In April 2013 was treated at [facility] for polymicrobial bacteremia (fungal, MRSA and enterobacter) felt to be due to ruptured pseudocyst, treated medically. Admitted Jan 2014 with abd pain and had gas and fluid in pancreatic bed. She refused surgery ("would not make it") and had fluid aspirated by IR- gram stain showed yeast and cultures grew enterococcus species, sensitive to amp and vanc. She was seen by ID and discharged on IV vanc/fortaz with HD and po voriconazole.     [name] ESRD on hemodialysis 05/06/2012    T,Th,Sat HD by [employer] [facility] on [address]. Started hemodialysis around July 2013.    [name] CHF (congestive heart failure)   . Complication of vascular access for dialysis 07/13/2012    L arm AVG (placed by Dr. Lyda Perone @ [facility]) was ligated 1/6  after significant L arm swelling secondary to L SCV stenosis and pacemaker; new access to be placed at right arm on 1/29 by Dr. Hollace Hayward    . NSTEMI (non-ST elevated myocardial infarction) 10/08/12    cardiac cath with no obvious acute lesion, medical therapy   Past Surgical History  Procedure Laterality Date  . Esophagogastroduodenoscopy  09/15/2011    Procedure: ESOPHAGOGASTRODUODENOSCOPY (EGD);  Surgeon: Theda Belfast, MD;  Location: [facility] ENDOSCOPY;  Service: Endoscopy;  Laterality: N/A;  .  Cholecystectomy  2003  . Tonsillectomy and adenoidectomy  1961  . Appendectomy  1973?  [name] Vaginal hysterectomy  1973?  [name] Tubal ligation  1972  . Dilation and curettage of uterus      "bunch from profuse bleeding in the 1970's" (05/23/2012)  . Coronary artery bypass graft  2008    CABG X4  . Coronary angioplasty with stent placement  2008    "1; day after CABG" (05/23/2012)  . Cardiac catheterization      "before 2008 and 3 wk ago" (05/23/2012)  . Insert / replace / remove pacemaker  06/2011    pacemaker ICD  . Cardiac defibrillator placement  06/2011  . Arteriovenous graft placement  03/2012    right antecub  . Insertion of dialysis catheter  ~ 10/2011    right chest  . Peritoneal catheter insertion  08/2011  . Peritoneal catheter removal  ~ 10/2011   Social History:  reports that she quit smoking about 12 years ago. Her smoking use included Cigarettes. She has a 10.23 pack-year smoking history. She has never used smokeless tobacco. She reports that she does not drink alcohol or use illicit drugs. Lives at home. where does patient live-- Not sure. Can patient participate in ADLs?  Allergies  Allergen Reactions  . Codeine Itching  . Morphine And Related Shortness Of Breath    SOB when given IV once "to me it was mild; I called out fast for help; never had to intubate" (05/23/2012)  . Ace Inhibitors Other (See Comments)    Unknown reaction but her physician stated that she can't take it (specifically lisinopril)  . Tylenol (Acetaminophen) Other (See Comments)    Patient states that the doctor told her she has a Fatty Liver.    Family History  Problem Relation Age of Onset  . Coronary artery disease Mother   . Hypertension Mother   . Diabetes Mother   . Diabetes Sister   . Anesthesia problems Neg Hx       Prior to Admission medications   Medication Sig Start Date End Date Taking? Authorizing Provider  amiodarone (PACERONE) 200 MG tablet Take 1 tablet (200 mg total) by mouth 2  (two) times daily. 10/25/12  Yes Abelino Derrick, PA-C  aspirin EC 81 MG EC tablet Take 1 tablet (81 mg total) by mouth daily. 05/11/12  Yes Larey Seat, MD  carvedilol (COREG) 3.125 MG tablet Take 1.5 tablets (4.6875 mg total) by mouth 2 (two) times daily with a meal. 10/13/12  Yes Brittainy Sharol Harness, PA-C  cinacalcet (SENSIPAR) 30 MG tablet Take 2 tablets (60 mg total) by mouth daily with breakfast. 09/03/12  Yes Elease Etienne, MD  docusate sodium 100 MG CAPS Take 100 mg by mouth 2 (two) times daily. 09/03/12  Yes Elease Etienne, MD  HYDROmorphone (DILAUDID) 2 MG tablet Take 2 tablets (4 mg total) by mouth every 6 (six) hours as needed (severe pain.). For pain 10/18/12  Yes Richarda Overlie, MD  methadone (  DOLOPHINE) 5 MG tablet Take 1 tablet (5 mg total) by mouth 4 (four) times daily. 10/18/12  Yes Richarda Overlie, MD  multivitamin (RENA-VIT) TABS tablet Take 1 tablet by mouth daily. 10/14/11  Yes Clanford Cyndie Mull, MD  nitroGLYCERIN (NITROSTAT) 0.4 MG SL tablet Place 1 tablet (0.4 mg total) under the tongue every 5 (five) minutes as needed for chest pain. 10/13/12  Yes Brittainy Simmons, PA-C  Nutritional Supplements (FEEDING SUPPLEMENT, NEPRO CARB STEADY,) LIQD Take 237 mLs by mouth 2 (two) times daily with breakfast and lunch. 03/04/12  Yes Srikar Cherlynn Kaiser, MD  pantoprazole (PROTONIX) 40 MG tablet Take 40 mg by mouth daily.   Yes Historical Provider, MD  pravastatin (PRAVACHOL) 20 MG tablet Take 20 mg by mouth at bedtime.    Yes Historical Provider, MD  ranolazine (RANEXA) 500 MG 12 hr tablet Take 500 mg by mouth daily.    Yes Historical Provider, MD  sevelamer (RENAGEL) 400 MG tablet Take 1 tablet (400 mg total) by mouth 3 (three) times daily with meals. With a meal 09/03/12  Yes Elease Etienne, MD   Physical Exam: Filed Vitals:   11/03/12 0345 11/03/12 0400 11/03/12 0415 11/03/12 0430  BP:  87/49  82/48  Pulse: 68 67 66 65  Temp:      Resp:   16 16  SpO2: 98% 99% 98% 99%     General:   Well-developed poorly nourished.  Eyes: Anicteric no pallor.  ENT: No discharge from ears eyes nose mouth.  Neck: No mass felt.  Cardiovascular: S1-S2 heard.  Respiratory: No rhonchi or crepitations.  Abdomen: Distended abdomen, nontender no rigidity.  Skin: No rash. Per nurse patient has sacral decubitus ulcer stage II.  Musculoskeletal: No edema.  Psychiatric: Patient is mildly drowsy. But follows commands.  Neurologic: Moves all extremities.  Labs on Admission:  Basic Metabolic Panel:  Recent Labs Lab 11/02/12 2249 11/02/12 2313  NA 138 139  K 4.3 4.4  CL 101 105  CO2 25  --   GLUCOSE 50* 46*  BUN 33* 30*  CREATININE 2.42* 2.60*  CALCIUM 9.1  --    Liver Function Tests:  Recent Labs Lab 11/02/12 2249  AST 24  ALT 21  ALKPHOS 188*  BILITOT 0.4  PROT 6.4  ALBUMIN 1.7*    Recent Labs Lab 11/02/12 2249  LIPASE 7*    Recent Labs Lab 11/03/12 0332  AMMONIA 33   CBC:  Recent Labs Lab 11/02/12 2249 11/02/12 2313  WBC 15.0*  --   NEUTROABS 13.5*  --   HGB 11.7* 12.9  HCT 37.1 38.0  MCV 89.8  --   PLT 163  --    Cardiac Enzymes: No results found for this basename: CKTOTAL, CKMB, CKMBINDEX, TROPONINI,  in the last 168 hours  BNP (last 3 results)  Recent Labs  10/09/12 0330 10/09/12 0955 10/15/12 0926  PROBNP >70000.0* >70000.0* >70000.0*   CBG:  Recent Labs Lab 11/03/12 0120  GLUCAP 139*    Radiological Exams on Admission: Ct Abdomen Pelvis Wo Contrast  11/03/2012   *RADIOLOGY REPORT*  Clinical Data: Abdominal pain.  End-stage renal disease.  CT ABDOMEN AND PELVIS WITHOUT CONTRAST  Technique:  Multidetector CT imaging of the abdomen and pelvis was performed following the standard protocol without intravenous contrast.  Comparison: CT scan 10/15/2012.  Findings: Stable cirrhotic changes involving the liver with massive abdominal/pelvic ascites, unchanged since recent study.  No obvious small bowel obstruction.  Severe  atherosclerotic calcifications involving the abdominal vasculature.  The kidneys demonstrate chronic changes of renal failure with cystic changes, scarring and calcification.  No hydronephrosis.  Diffuse body wall edema is again demonstrated.  Persistent right pleural effusion and right lower lobe infiltrate.  IMPRESSION:  1.  Persistent right pleural effusion and right lower lobe infiltrate. 2.  Cirrhosis and massive abdominal/pelvic ascites. 3.  Severe atherosclerotic calcifications. 4.  Chronic renal disease but no hydronephrosis.   Original Report Authenticated By: Rudie Meyer, M.D.   Dg Chest Port 1 View  11/02/2012   *RADIOLOGY REPORT*  Clinical Data: Swelling in feet and chest pain.  PORTABLE CHEST - 1 VIEW  Comparison: 10/16/2012.  Findings: The heart is enlarged but stable.  The pacer wires / AICD are unchanged.  The right IJ diatek catheter is stable.  The lungs are clear.  No edema or effusions.  IMPRESSION:  1.  Cardiac enlargement, stable. 2.  Stable support apparatus. 3.  No acute pulmonary findings.   Original Report Authenticated By: Rudie Meyer, M.D.     Assessment/Plan Principal Problem:   Abdominal pain Active Problems:   Chronic systolic congestive heart failure, NYHA class 3   ESRD on hemodialysis   CAD, CABG X 4 Feb '08, LAD stent June '08- cath 4/14 - med Rx   Ascites   Healthcare-associated pneumonia   1. Abdominal pain or nausea vomiting most likely secondary to massive ascites in a patient with known history of cirrhosis of liver - patient has been already started the antibiotics for pneumonia which will also cover for possible SBP. Therapeutic and diagnostic paracentesis has been ordered. If patient's nausea vomiting persists after the paracentesis then may consider further tests. 2. Hypoglycemia - probably secondary to poor oral intake secondary to nausea vomiting. At this time I ordered CBG every 2 hours. If patient becomes hypoglycemic again then may need to start  on D. 10. 3. Possible health care associated pneumonia - CT shows infiltrates in the lung. Patient is placed on vancomycin cefepime and Levaquin. 4. CAD status post CABG and stenting had recently had cardiac catheter after patient had slow V. tach and non-ST MI - presently denies any chest pain. Continue home medications. 5. ESRD on hemodialysis Monday Wednesday and Friday - please consult nephrologist for dialysis.    Code Status: Full code.  Family Communication: None.  Disposition Plan: Admit to inpatient.    Jissel Slavens N. [facility] Pager [phone].  If 7PM-7AM, please contact night-coverage [email or web address] Password [facility] 11/03/2012, 5:34 AM

## 2012-11-03 NOTE — Progress Notes (Signed)
CBG: 52  Treatment: D50 IV 25 mL  Symptoms: lethargic  Follow-up CBG: Time:0931 CBG Result:88  Possible Reasons for Event: Inadequate meal intake  Comments/MD notified: Stephani Police, PA-C made aware. She placed orders for D50 IV 25 ML.  Will continue to monitor patient and encourage PO nutrition intake.     Audie Pinto

## 2012-11-03 NOTE — ED Notes (Signed)
Pt given 0.5 mg of Dilaudid due the pain. Med Charted at 7. Only 0.5 mg was given. MD verbally states ok to give half a dose then wait to give the rest of med.

## 2012-11-03 NOTE — Progress Notes (Addendum)
Thank you for consulting the Palliative Medicine Team at [facility] to meet your patient's and family's needs.   The reason that you asked [place] to see your patient is for a GOC - Goals of Care  We have scheduled with patient's daughter [name] @ [phone] for a telephone GOC  tomorrow Friday 5/23 @ 12noon.   Alternative phone number for dtr at work is [phone] Extension [phone]  Other family members that may be included via phone is : Son Shawn  Chalmers Cater RN,  PMT RN Liaison Call PMT phone [phone]

## 2012-11-03 NOTE — Clinical Documentation Improvement (Signed)
MALNUTRITION DOCUMENTATION CLARIFICATION  THIS DOCUMENT IS NOT A PERMANENT PART OF THE MEDICAL RECORD  TO RESPOND TO THE THIS QUERY, FOLLOW THE INSTRUCTIONS BELOW:  1. If needed, update documentation for the patient's encounter via the notes activity.  2. Access this query again and click edit on the In [employer].  3. After updating, or not, click F2 to complete all highlighted (required) fields concerning your review. Select "additional documentation in the medical record" OR "no additional documentation provided".  4. Click Sign note button.  5. The deficiency will fall out of your In Basket *Please let us know if you are not able to complete this workflow by phone or e-mail (listed below).  Please update your documentation within the medical record to reflect your response to this query.                                                                                        11/03/12   Dear Dr.Subhan Hoopes / Associates,  In a better effort to capture your patient's severity of illness, reflect appropriate length of stay and utilization of resources, a review of the patient medical record has revealed the following indicators.    Based on your clinical judgment, please clarify and document in a progress note and/or discharge summary the clinical condition associated with the following supporting information:  In responding to this query please exercise your independent judgment.  The fact that a query is asked, does not imply that any particular answer is desired or expected.  Possible Clinical Conditions?   _______Severe Malnutrition    _______Severe Protein Calorie Malnutrition  _______Other Condition  _______Cannot clinically determine     Supporting Information:  Risk Factors:(As per Nutrition Assessment) " ESRD/HD, PNA, CHF, CHRONIC PANCREATITIS, ASCITES, LIVER CIRRHOSIS  Signs & Symptoms:(As per Nutrition Assessment) Inadequate oral intake related to poor appetite as  evidenced by poor oral intake and ongoing weight loss."  "-Severe malnutrition in the context of chronic illness."   Ht: 5'2"     Wt:111 lb 1.6 oz   BMI:  19.06 kg/(m^2)  Treatment:  INTERVENTION: Add Resource Breeze PO BID. Recommend nutrition support if within goals of care given ongoing malnutrition. RD to continue to follow nutrition care plan.  Nutrition Consult by Haynes Bast, RD on 11-03-12   You may use possible, probable, or suspect with inpatient documentation. possible, probable, suspected diagnoses MUST be documented at the time of discharge  Reviewed: additional documentation in the medical record  Thank You,  Joanette Gula Delk RN, BSN, CCDS Clinical Documentation Specialist: [phone] Pager Health Information Management [place]

## 2012-11-03 NOTE — Consult Note (Addendum)
[facility] Renal Consultation Note    Indication for Consultation:  Management of ESRD/hemodialysis; anemia, hypertension/volume and secondary hyperparathyroidism  HPI: [name] is a 62 y.o. female ESRD patient (MWThF HD) with PMH significant for chronic pancreatitis, ischemic cardiomyopathy and frequent admissions, most recently for SVT and NSTEMI on 10/13/12. She was at her baseline until Wednesday afternoon and subsequently presenting to the ED  with nausea, vomiting and abdominal pain last evening. CT abdomen and pelvis was positive for reaccumulation of large volume ascites as well as possible lung infiltrates. She has been placed on antibiotics and paracentesis is pending.  Last dialysis was 11/02/12. During her last admission, her treatment schedule was increased from 3 to 4 times per week due to UF limiting hypotension. She states that treatments have been going well but upon review of records, it is noted that she still has not been able to complete a full treatment. She denies HA, CP, SOB, dizziness, diarrhea or fever.  Past Medical History  Diagnosis Date  . Coronary artery disease     s/p CABG 2008 with multiple PCIs  . Ischemic cardiomyopathy     Cath 04/2012, significant calcifications  . History of nonadherence to medical treatment   . Systolic CHF     81/1914 echo EF 10%  . Esophageal varices   . Cirrhosis     hx of fatty liver per patient, has known ascites with repeated paracenteses at [facility] as of 2013, also +hx of esoph varices with banding in the past  . Pacemaker   . ICD (implantable cardiac defibrillator) in place   . Hypertension     "used to have HTN; now I'm low" (05/23/2012)  . NSTEMI (non-ST elevated myocardial infarction)     04/2012; Trop peaked to 1.39; 05/23/2012 pt denies ever having MI  . Pneumonia 02/2012    "first time ever" (05/23/2012)  . Chronic bronchitis 1970's thru 2002    "went away when I stopped smoking in 2002" (05/23/2012)   . Diabetes mellitus     Diet controlled  . History of blood transfusion     "alot of them" (05/23/2012)  . Iron (Fe) deficiency anemia     "severe" (05/23/2012)  . Pancreatitis, chronic 10/04/2011    Had severe gallstone pancreatitis in May 2003, no surgery, treated medically then returned in June 2003 for cholecystectomy and cyst gastrostomy for pancreatic pseudocyst. In March 2013 was admitted for hemorrhage into psueodcyst. In April 2013 was treated at [facility] for polymicrobial bacteremia (fungal, MRSA and enterobacter) felt to be due to ruptured pseudocyst, treated medically. Admitted Jan 2014 with abd pain and had gas and fluid in pancreatic bed. She refused surgery ("would not make it") and had fluid aspirated by IR- gram stain showed yeast and cultures grew enterococcus species, sensitive to amp and vanc. She was seen by ID and discharged on IV vanc/fortaz with HD and po voriconazole.     [name] ESRD on hemodialysis 05/06/2012    T,Th,Sat HD by [facility] on [address]. Started hemodialysis around July 2013.    [name] CHF (congestive heart failure)   . Complication of vascular access for dialysis 07/13/2012    L arm AVG (placed by Dr. Lyda Perone @ [facility]) was ligated 1/6 after significant L arm swelling secondary to L SCV stenosis and pacemaker; new access to be placed at right arm on 1/29 by Dr. Hollace Hayward    . NSTEMI (non-ST elevated myocardial infarction) 10/08/12    cardiac cath with  no obvious acute lesion, medical therapy   Past Surgical History  Procedure Laterality Date  . Esophagogastroduodenoscopy  09/15/2011    Procedure: ESOPHAGOGASTRODUODENOSCOPY (EGD);  Surgeon: Theda Belfast, MD;  Location: [facility] ENDOSCOPY;  Service: Endoscopy;  Laterality: N/A;  . Cholecystectomy  2003  . Tonsillectomy and adenoidectomy  1961  . Appendectomy  1973?  [name] Vaginal hysterectomy  1973?  [name] Tubal ligation  1972  . Dilation and curettage of uterus      "bunch from profuse bleeding in the 1970's"  (05/23/2012)  . Coronary artery bypass graft  2008    CABG X4  . Coronary angioplasty with stent placement  2008    "1; day after CABG" (05/23/2012)  . Cardiac catheterization      "before 2008 and 3 wk ago" (05/23/2012)  . Insert / replace / remove pacemaker  06/2011    pacemaker ICD  . Cardiac defibrillator placement  06/2011  . Arteriovenous graft placement  03/2012    right antecub  . Insertion of dialysis catheter  ~ 10/2011    right chest  . Peritoneal catheter insertion  08/2011  . Peritoneal catheter removal  ~ 10/2011   Family History  Problem Relation Age of Onset  . Coronary artery disease Mother   . Hypertension Mother   . Diabetes Mother   . Diabetes Sister   . Anesthesia problems Neg Hx    Social History:  reports that she quit smoking about 12 years ago. Her smoking use included Cigarettes. She has a 10.23 pack-year smoking history. She has never used smokeless tobacco. She reports that she does not drink alcohol or use illicit drugs. Allergies  Allergen Reactions  . Codeine Itching  . Morphine And Related Shortness Of Breath    SOB when given IV once "to me it was mild; I called out fast for help; never had to intubate" (05/23/2012)  . Ace Inhibitors Other (See Comments)    Unknown reaction but her physician stated that she can't take it (specifically lisinopril)  . Tylenol (Acetaminophen) Other (See Comments)    Patient states that the doctor told her she has a Fatty Liver.   Prior to Admission medications   Medication Sig Start Date End Date Taking? Authorizing Provider  amiodarone (PACERONE) 200 MG tablet Take 1 tablet (200 mg total) by mouth 2 (two) times daily. 10/25/12  Yes Abelino Derrick, PA-C  aspirin EC 81 MG EC tablet Take 1 tablet (81 mg total) by mouth daily. 05/11/12  Yes Larey Seat, MD  carvedilol (COREG) 3.125 MG tablet Take 1.5 tablets (4.6875 mg total) by mouth 2 (two) times daily with a meal. 10/13/12  Yes Brittainy Sharol Harness, PA-C  cinacalcet  (SENSIPAR) 30 MG tablet Take 2 tablets (60 mg total) by mouth daily with breakfast. 09/03/12  Yes Elease Etienne, MD  docusate sodium 100 MG CAPS Take 100 mg by mouth 2 (two) times daily. 09/03/12  Yes Elease Etienne, MD  HYDROmorphone (DILAUDID) 2 MG tablet Take 2 tablets (4 mg total) by mouth every 6 (six) hours as needed (severe pain.). For pain 10/18/12  Yes Richarda Overlie, MD  methadone (DOLOPHINE) 5 MG tablet Take 1 tablet (5 mg total) by mouth 4 (four) times daily. 10/18/12  Yes Richarda Overlie, MD  multivitamin (RENA-VIT) TABS tablet Take 1 tablet by mouth daily. 10/14/11  Yes Clanford Cyndie Mull, MD  nitroGLYCERIN (NITROSTAT) 0.4 MG SL tablet Place 1 tablet (0.4 mg total) under the tongue every 5 (five) minutes  as needed for chest pain. 10/13/12  Yes Brittainy Simmons, PA-C  Nutritional Supplements (FEEDING SUPPLEMENT, NEPRO CARB STEADY,) LIQD Take 237 mLs by mouth 2 (two) times daily with breakfast and lunch. 03/04/12  Yes Srikar Cherlynn Kaiser, MD  pantoprazole (PROTONIX) 40 MG tablet Take 40 mg by mouth daily.   Yes Historical Provider, MD  pravastatin (PRAVACHOL) 20 MG tablet Take 20 mg by mouth at bedtime.    Yes Historical Provider, MD  ranolazine (RANEXA) 500 MG 12 hr tablet Take 500 mg by mouth daily.    Yes Historical Provider, MD  sevelamer (RENAGEL) 400 MG tablet Take 1 tablet (400 mg total) by mouth 3 (three) times daily with meals. With a meal 09/03/12  Yes Elease Etienne, MD   Current Facility-Administered Medications  Medication Dose Route Frequency Provider Last Rate Last Dose  . acetaminophen (TYLENOL) tablet 650 mg  650 mg Oral Q6H PRN Eduard Clos, MD       Or  . acetaminophen (TYLENOL) suppository 650 mg  650 mg Rectal Q6H PRN Eduard Clos, MD      . amiodarone (PACERONE) tablet 200 mg  200 mg Oral BID Eduard Clos, MD   200 mg at 11/03/12 1142  . aspirin EC tablet 81 mg  81 mg Oral Daily Eduard Clos, MD   81 mg at 11/03/12 1141  . bisacodyl (DULCOLAX)  suppository 10 mg  10 mg Rectal Once Stephani Police, PA-C      . carvedilol (COREG) tablet 4.6875 mg  4.6875 mg Oral BID WC Eduard Clos, MD   4.6875 mg at 11/03/12 1141  . [START ON 11/04/2012] ceFEPIme (MAXIPIME) 2 g in dextrose 5 % 50 mL IVPB  2 g Intravenous Q M,W,F-HD Eduard Clos, MD      . cinacalcet (SENSIPAR) tablet 60 mg  60 mg Oral Q breakfast Eduard Clos, MD      . dextrose 10 % infusion   Intravenous Continuous Stephani Police, PA-C 20 mL/hr at 11/03/12 1144    . dextrose 50 % solution 50 mL  50 mL Intravenous Q2H PRN Stephani Police, PA-C      . docusate sodium (COLACE) capsule 100 mg  100 mg Oral BID Eduard Clos, MD   100 mg at 11/03/12 1140  . feeding supplement (NEPRO CARB STEADY) liquid 237 mL  237 mL Oral BID WC Eduard Clos, MD   237 mL at 11/03/12 1200  . feeding supplement (RESOURCE BREEZE) liquid 1 Container  1 Container Oral BID BM Haynes Bast, RD      . heparin injection 5,000 Units  5,000 Units Subcutaneous Q8H Eduard Clos, MD   5,000 Units at 11/03/12 0820  . [START ON 11/05/2012] levofloxacin (LEVAQUIN) IVPB 500 mg  500 mg Intravenous Q48H Eduard Clos, MD      . methadone (DOLOPHINE) tablet 5 mg  5 mg Oral QID Eduard Clos, MD   5 mg at 11/03/12 1345  . multivitamin (RENA-VIT) tablet 1 tablet  1 tablet Oral Daily Eduard Clos, MD   1 tablet at 11/03/12 1141  . nitroGLYCERIN (NITROSTAT) SL tablet 0.4 mg  0.4 mg Sublingual Q5 min PRN Eduard Clos, MD      . ondansetron [facility]) tablet 4 mg  4 mg Oral Q6H PRN Eduard Clos, MD       Or  . ondansetron [facility]) injection 4 mg  4 mg Intravenous Q6H PRN Georgetta Haber  Demetra Shiner, MD      . pantoprazole (PROTONIX) EC tablet 40 mg  40 mg Oral Daily Eduard Clos, MD   40 mg at 11/03/12 1140  . polyethylene glycol (MIRALAX / GLYCOLAX) packet 17 g  17 g Oral Daily Stephani Police, PA-C   17 g at 11/03/12 1150  . ranolazine (RANEXA) 12 hr tablet  500 mg  500 mg Oral Daily Eduard Clos, MD   500 mg at 11/03/12 1139  . sevelamer carbonate (RENVELA) tablet 400 mg  400 mg Oral TID WC Eduard Clos, MD      . simvastatin (ZOCOR) tablet 10 mg  10 mg Oral q1800 Eduard Clos, MD      . sodium chloride 0.9 % injection 3 mL  3 mL Intravenous Q12H Eduard Clos, MD      . sodium chloride 0.9 % injection 3 mL  3 mL Intravenous Q12H Eduard Clos, MD   3 mL at 11/03/12 0815  . [START ON 11/04/2012] vancomycin (VANCOCIN) 500 mg in sodium chloride 0.9 % 100 mL IVPB  500 mg Intravenous Q M,W,F-HD Eduard Clos, MD       Labs: Basic Metabolic Panel:  Recent Labs Lab 11/02/12 2249 11/02/12 2313 11/03/12 1200  NA 138 139 137  K 4.3 4.4 4.8  CL 101 105 102  CO2 25  --  20  GLUCOSE 50* 46* 108*  BUN 33* 30* 35*  CREATININE 2.42* 2.60* 2.68*  CALCIUM 9.1  --  9.2   Liver Function Tests:  Recent Labs Lab 11/02/12 2249 11/03/12 1200  AST 24 21  ALT 21 20  ALKPHOS 188* 170*  BILITOT 0.4 0.3  PROT 6.4 5.5*  ALBUMIN 1.7* 1.4*    Recent Labs Lab 11/02/12 2249  LIPASE 7*    Recent Labs Lab 11/03/12 0332  AMMONIA 33   CBC:  Recent Labs Lab 11/02/12 2249 11/02/12 2313 11/03/12 1200  WBC 15.0*  --  12.9*  NEUTROABS 13.5*  --   --   HGB 11.7* 12.9 10.1*  HCT 37.1 38.0 32.3*  MCV 89.8  --  89.5  PLT 163  --  124*   Cardiac Enzymes: No results found for this basename: CKTOTAL, CKMB, CKMBINDEX, TROPONINI,  in the last 168 hours CBG:  Recent Labs Lab 11/03/12 0801 11/03/12 0931 11/03/12 0955 11/03/12 1124 11/03/12 1241  GLUCAP 52* 88 131* 109* 137*   INR: Studies/Results: Ct Abdomen Pelvis Wo Contrast  11/03/2012   *RADIOLOGY REPORT*  Clinical Data: Abdominal pain.  End-stage renal disease.  CT ABDOMEN AND PELVIS WITHOUT CONTRAST  Technique:  Multidetector CT imaging of the abdomen and pelvis was performed following the standard protocol without intravenous contrast.  Comparison:  CT scan 10/15/2012.  Findings: Stable cirrhotic changes involving the liver with massive abdominal/pelvic ascites, unchanged since recent study.  No obvious small bowel obstruction.  Severe atherosclerotic calcifications involving the abdominal vasculature.  The kidneys demonstrate chronic changes of renal failure with cystic changes, scarring and calcification.  No hydronephrosis.  Diffuse body wall edema is again demonstrated.  Persistent right pleural effusion and right lower lobe infiltrate.  IMPRESSION:  1.  Persistent right pleural effusion and right lower lobe infiltrate. 2.  Cirrhosis and massive abdominal/pelvic ascites. 3.  Severe atherosclerotic calcifications. 4.  Chronic renal disease but no hydronephrosis.   Original Report Authenticated By: Rudie Meyer, M.D.   Dg Chest Port 1 View  11/02/2012   *RADIOLOGY REPORT*  Clinical Data: Swelling  in feet and chest pain.  PORTABLE CHEST - 1 VIEW  Comparison: 10/16/2012.  Findings: The heart is enlarged but stable.  The pacer wires / AICD are unchanged.  The right IJ diatek catheter is stable.  The lungs are clear.  No edema or effusions.  IMPRESSION:  1.  Cardiac enlargement, stable. 2.  Stable support apparatus. 3.  No acute pulmonary findings.   Original Report Authenticated By: Rudie Meyer, M.D.    ROS: Nausea, abdominal pain and distention. All other systems negative except as described above.   Physical Exam: Filed Vitals:   11/03/12 0530 11/03/12 0700 11/03/12 0931 11/03/12 1401  BP: 94/70 100/62 95/64 96/57   Pulse:  66 72 74  Temp:  97.6 F (36.4 C) 98.3 F (36.8 C) 98 F (36.7 C)  TempSrc:  Oral Oral Oral  Resp:  18 17 18   Height:  5\' 4"  (1.626 m)    Weight:  50.395 kg (111 lb 1.6 oz)    SpO2:  100% 100% 100%     General: Cachectic, tearful, in obvious discomfort. Head: Normocephalic, atraumatic, sclera non-icteric, mucus membranes are moist Neck: Supple. JVD not elevated. Lungs: Mostly clear. No without wheezes, rales, or  rhonchi. Breathing is unlabored. Heart: RRR S1/S2. AICD left chest. No murmurs, rubs, or gallops appreciated. Abdomen: Distended. Diffusely tender, esp in LUQ and suprapubic region,  +guarding. Normoactive bowel sounds.  No obvious abdominal masses. M-S:  Strength and tone somewhat diminished. Lower extremities:3+ LE pitting edema. No ischemic changes or open wounds  Neuro: Alert and oriented X 3. Moves all extremities spontaneously. Psych:  Responds to questions appropriately with a normal affect. Dialysis Access: Rt I-J  Dialysis Orders: Center: SW on MWThF. EDW 45.5 kg HD Bath 3K/2.25Ca  Time 4:00 Heparin 1400 u. Access R I-J BFR 400 DFR A1.5    Hectorol 0 mcg IV/HD Epogen 20000  Units IV/HD  Venofer  0   Assessment/Plan: 1. Abdominal Pain/Nausea/Vomiting - +ascites and possible lung infiltrates on CT in patient with known cirrhosis and chronic pancreatitis. Diagnostic and therapeutic paracentesis attempted - but no ascites found (loculated/ perihepatic). On IV Vancomycin, Cefepime, and Levaquin. Dilaudid and methadone continued for pain control. She may have another pancreatic bed infection causing relatively acute increase in abd pain and septated fluid collections in abdomen. On empiric abx 2. ESRD -  MWThF HD.  K+4.8. Plan for HD today. 3. Hypertension/volume  - SBPs 90's. No BP meds. No SOB. No effusions or pulm edema on CXR. However 3+ LE edema is persistent, even with 4x week HD. Has not been able to complete a full treatment since last admission due to hypotension. Significantly over EDW - ascites likely contributing as well shortened tx times. Pt is limited code with poor prognosis. ? if increased pain medication requirements further impacting BP and ability to complete treatments. Palliative care on board, goals of care mtg scheduled for tomorrow. Would appreciate input with symptom management. Plan for HD today since no paracentesis. Might benefit from SLED 4. Anemia  - Hgb 10.1 on op  Epo 20000u. Will start Aranesp 150. No op IV Fe or recent iron studies. 5. Metabolic bone disease -  Ca 9.2 (11.2 corrected) Low Ca bath, continue Renvela. Last PTH 216.7 on 5/9. No Vit D. 6. Nutrition - Severely malnourished. On clears for now. Liberalized diet with fluid restriction ok when appropriate. Multivitamin, breeze, nepro.  Claud Kelp, PA-C [facility] Pager [phone] 11/03/2012, 2:14 PM   Patient seen and examined.  I agree with plan as above with additions as indicated. Vinson Moselle  MD [phone] pgr    [phone] cell 11/03/2012, 5:50 PM

## 2012-11-03 NOTE — ED Notes (Addendum)
I-stat troponin: 0.07

## 2012-11-03 NOTE — ED Notes (Signed)
Pt back from CT scan

## 2012-11-03 NOTE — Progress Notes (Signed)
ANTIBIOTIC CONSULT NOTE - INITIAL  Pharmacy Consult for Vancomycin, Levaquin and Cefepime Indication: pneumonia  Allergies  Allergen Reactions  . Codeine Itching  . Morphine And Related Shortness Of Breath    SOB when given IV once "to me it was mild; I called out fast for help; never had to intubate" (05/23/2012)  . Ace Inhibitors Other (See Comments)    Unknown reaction but her physician stated that she can't take it (specifically lisinopril)  . Tylenol (Acetaminophen) Other (See Comments)    Patient states that the doctor told her she has a Fatty Liver.    Patient Measurements: Height: 5\' 4"  (162.6 cm) Weight: 111 lb 1.6 oz (50.395 kg) IBW/kg (Calculated) : 54.7   Vital Signs: Temp: 97.6 F (36.4 C) (05/22 0700) Temp src: Oral (05/22 0700) BP: 100/62 mmHg (05/22 0700) Pulse Rate: 66 (05/22 0700) Intake/Output from previous day:   Intake/Output from this shift:    Labs:  Recent Labs  11/02/12 2249 11/02/12 2313  WBC 15.0*  --   HGB 11.7* 12.9  PLT 163  --   CREATININE 2.42* 2.60*   Estimated Creatinine Clearance: 17.6 ml/min (by C-G formula based on Cr of 2.6). No results found for this basename: VANCOTROUGH, Leodis Binet, VANCORANDOM, GENTTROUGH, GENTPEAK, GENTRANDOM, TOBRATROUGH, TOBRAPEAK, TOBRARND, AMIKACINPEAK, AMIKACINTROU, AMIKACIN,  in the last 72 hours   Microbiology: Recent Results (from the past 720 hour(s))  MRSA PCR SCREENING     Status: None   Collection Time    10/09/12  1:28 AM      Result Value Range Status   MRSA by PCR NEGATIVE  NEGATIVE Final   Comment:            The GeneXpert MRSA Assay (FDA     approved for NASAL specimens     only), is one component of a     comprehensive MRSA colonization     surveillance program. It is not     intended to diagnose MRSA     infection nor to guide or     monitor treatment for     MRSA infections.  BODY FLUID CULTURE     Status: None   Collection Time    10/17/12  3:27 PM      Result Value Range  Status   Specimen Description FLUID ASCITIC ABDOMEN   Final   Special Requests FLUID   Final   Gram Stain     Final   Value: RARE WBC PRESENT, PREDOMINANTLY MONONUCLEAR     NO ORGANISMS SEEN   Culture NO GROWTH 3 DAYS   Final   Report Status 10/21/2012 FINAL   Final    Medical History: Past Medical History  Diagnosis Date  . Coronary artery disease     s/p CABG 2008 with multiple PCIs  . Ischemic cardiomyopathy     Cath 04/2012, significant calcifications  . History of nonadherence to medical treatment   . Systolic CHF     16/1096 echo EF 10%  . Esophageal varices   . Cirrhosis     hx of fatty liver per patient, has known ascites with repeated paracenteses at [facility] as of 2013, also +hx of esoph varices with banding in the past  . Pacemaker   . ICD (implantable cardiac defibrillator) in place   . Hypertension     "used to have HTN; now I'm low" (05/23/2012)  . NSTEMI (non-ST elevated myocardial infarction)     04/2012; Trop peaked to 1.39; 05/23/2012 pt denies ever  having MI  . Pneumonia 02/2012    "first time ever" (05/23/2012)  . Chronic bronchitis 1970's thru 2002    "went away when I stopped smoking in 2002" (05/23/2012)  . Diabetes mellitus     Diet controlled  . History of blood transfusion     "alot of them" (05/23/2012)  . Iron (Fe) deficiency anemia     "severe" (05/23/2012)  . Pancreatitis, chronic 10/04/2011    Had severe gallstone pancreatitis in May 2003, no surgery, treated medically then returned in June 2003 for cholecystectomy and cyst gastrostomy for pancreatic pseudocyst. In March 2013 was admitted for hemorrhage into psueodcyst. In April 2013 was treated at [facility] for polymicrobial bacteremia (fungal, MRSA and enterobacter) felt to be due to ruptured pseudocyst, treated medically. Admitted Jan 2014 with abd pain and had gas and fluid in pancreatic bed. She refused surgery ("would not make it") and had fluid aspirated by IR- gram stain showed yeast and cultures grew  enterococcus species, sensitive to amp and vanc. She was seen by ID and discharged on IV vanc/fortaz with HD and po voriconazole.     [name] ESRD on hemodialysis 05/06/2012    T,Th,Sat HD by [facility] on [address]. Started hemodialysis around July 2013.    [name] CHF (congestive heart failure)   . Complication of vascular access for dialysis 07/13/2012    L arm AVG (placed by Dr[facility] Lyda Perone [facility]) was ligated 1/6 after significant L arm swelling secondary to L SCV stenosis and pacemaker; new access to be placed at right arm on 1/29 by Dr. Hollace Hayward    . NSTEMI (non-ST elevated myocardial infarction) 10/08/12    cardiac cath with no obvious acute lesion, medical therapy    Medications:  Scheduled:  . amiodarone  200 mg Oral BID  . aspirin EC  81 mg Oral Daily  . carvedilol  4.6875 mg Oral BID WC  . cinacalcet  60 mg Oral Q breakfast  . docusate sodium  100 mg Oral BID  . feeding supplement (NEPRO CARB STEADY)  237 mL Oral BID WC  . heparin  5,000 Units Subcutaneous Q8H  . methadone  5 mg Oral QID  . multivitamin  1 tablet Oral Daily  . pantoprazole  40 mg Oral Daily  . ranolazine  500 mg Oral Daily  . sevelamer carbonate  400 mg Oral TID WC  . simvastatin  10 mg Oral q1800  . sodium chloride  3 mL Intravenous Q12H  . sodium chloride  3 mL Intravenous Q12H   Assessment: 62 yr old female with ESRD on hemodialysis MWF on Vancomycin, Cefepime and Levaquin for possible HCAP.    Goal of Therapy:  Vancomycin trough level 15-20 mcg/ml  Plan:  Start Cefepime 2gm IV qHD, Levaquin 750mg  IV load, then 500mg  IV q48hrs and Vancomycin 1000mg  IV load, then 500mg  IV qHD. F/u HD schedule, clinical status, culture data.   Wendie Simmer, PharmD, BCPS Clinical Pharmacist  Pager: [phone]

## 2012-11-03 NOTE — ED Notes (Signed)
MD at bedside. Consult 

## 2012-11-03 NOTE — Progress Notes (Addendum)
INITIAL NUTRITION ASSESSMENT  DOCUMENTATION CODES Per approved criteria  -Severe malnutrition in the context of chronic illness   INTERVENTION: Add Resource Breeze PO BID. Recommend nutrition support if within goals of care given ongoing malnutrition. RD to continue to follow nutrition care plan.  NUTRITION DIAGNOSIS: Inadequate oral intake related to poor appetite as evidenced by poor oral intake and ongoing weight loss.   Goal: Intake to meet >90% of estimated nutrition needs.  Monitor:  weight trends, lab trends, I/O's, PO intake, supplement tolerance, GOC  Reason for Assessment: Health History  62 y.o. female  Admitting Dx: abdominal pain  ASSESSMENT: Pt well-known to this RD.  Admitted with worsening abdominal pain, n/v. CT abdomen and pelvis shows massive ascites and possible infiltrates in the lung. Also admitted with low blood sugars 2/2 poor oral intake and n/v.  Palliative care consulted to [facility]. Per chart, plan for meeting tomorrow (5/23) at noon.  Pt with decreased level of consciousness at this time. Currently on clear liquids.  Nutrition Focused Physical Exam:  Subcutaneous Fat:  Orbital Region: moderate Upper Arm Region: severe Thoracic and Lumbar Region: n/a  Muscle:  Temple Region: moderate Clavicle Bone Region: severe Clavicle and Acromion Bone Region: severe Scapular Bone Region: n/a Dorsal Hand: severe Patellar Region: severe Anterior Thigh Region: severe Posterior Calf Region: severe  Edema: n/a  Pt meets criteria for severe MALNUTRITION in the context of chronic illness as evidenced by severe muscle and fat mass loss.    Height: Ht Readings from Last 1 Encounters:  11/03/12 5\' 4"  (1.626 m)    Weight: Wt Readings from Last 1 Encounters:  11/03/12 111 lb 1.6 oz (50.395 kg)    Ideal Body Weight: 120 lb  % Ideal Body Weight: 92.5%  Wt Readings from Last 10 Encounters:  11/03/12 111 lb 1.6 oz (50.395 kg)  10/25/12 102 lb  (46.267 kg)  10/18/12 87 lb 11.9 oz (39.8 kg)  10/13/12 100 lb 5 oz (45.5 kg)  10/13/12 100 lb 5 oz (45.5 kg)  09/03/12 100 lb 5 oz (45.5 kg)  08/03/12 120 lb (54.432 kg)  07/19/12 113 lb 5.1 oz (51.4 kg)  07/07/12 113 lb 12.1 oz (51.6 kg)  06/03/12 124 lb 1.9 oz (56.3 kg)    Usual Body Weight: 115 - 120 lb  % Usual Body Weight: 94%  BMI:  Body mass index is 19.06 kg/(m^2). WNL  Estimated Nutritional Needs: Kcal: 1600 - 1750  Protein: 65 - 75 grams Fluid: 1.2 liters  Skin: stage II sacrum  Diet Order: Clear Liquid   EDUCATION NEEDS: -No education needs identified at this time  No intake or output data in the 24 hours ending 11/03/12 1209  Last BM: PTA  Labs:   Recent Labs Lab 11/02/12 2249 11/02/12 2313  NA 138 139  K 4.3 4.4  CL 101 105  CO2 25  --   BUN 33* 30*  CREATININE 2.42* 2.60*  CALCIUM 9.1  --   GLUCOSE 50* 46*    CBG (last 3)   Recent Labs  11/03/12 0931 11/03/12 0955 11/03/12 1124  GLUCAP 88 131* 109*    Scheduled Meds: . amiodarone  200 mg Oral BID  . aspirin EC  81 mg Oral Daily  . bisacodyl  10 mg Rectal Once  . carvedilol  4.6875 mg Oral BID WC  . ceFEPime (MAXIPIME) IV  2 g Intravenous Once  . cinacalcet  60 mg Oral Q breakfast  . docusate sodium  100 mg Oral BID  .  feeding supplement (NEPRO CARB STEADY)  237 mL Oral BID WC  . heparin  5,000 Units Subcutaneous Q8H  . [START ON 11/05/2012] levofloxacin (LEVAQUIN) IV  500 mg Intravenous Q48H  . methadone  5 mg Oral QID  . multivitamin  1 tablet Oral Daily  . pantoprazole  40 mg Oral Daily  . polyethylene glycol  17 g Oral Daily  . ranolazine  500 mg Oral Daily  . sevelamer carbonate  400 mg Oral TID WC  . simvastatin  10 mg Oral q1800  . sodium chloride  3 mL Intravenous Q12H  . sodium chloride  3 mL Intravenous Q12H  . vancomycin  1,000 mg Intravenous Once    Continuous Infusions: . dextrose 20 mL/hr at 11/03/12 1144    Past Medical History  Diagnosis Date  .  Coronary artery disease     s/p CABG 2008 with multiple PCIs  . Ischemic cardiomyopathy     Cath 04/2012, significant calcifications  . History of nonadherence to medical treatment   . Systolic CHF     16/1096 echo EF 10%  . Esophageal varices   . Cirrhosis     hx of fatty liver per patient, has known ascites with repeated paracenteses at [facility] as of 2013, also +hx of esoph varices with banding in the past  . Pacemaker   . ICD (implantable cardiac defibrillator) in place   . Hypertension     "used to have HTN; now I'm low" (05/23/2012)  . NSTEMI (non-ST elevated myocardial infarction)     04/2012; Trop peaked to 1.39; 05/23/2012 pt denies ever having MI  . Pneumonia 02/2012    "first time ever" (05/23/2012)  . Chronic bronchitis 1970's thru 2002    "went away when I stopped smoking in 2002" (05/23/2012)  . Diabetes mellitus     Diet controlled  . History of blood transfusion     "alot of them" (05/23/2012)  . Iron (Fe) deficiency anemia     "severe" (05/23/2012)  . Pancreatitis, chronic 10/04/2011    Had severe gallstone pancreatitis in May 2003, no surgery, treated medically then returned in June 2003 for cholecystectomy and cyst gastrostomy for pancreatic pseudocyst. In March 2013 was admitted for hemorrhage into psueodcyst. In April 2013 was treated at [facility] for polymicrobial bacteremia (fungal, MRSA and enterobacter) felt to be due to ruptured pseudocyst, treated medically. Admitted Jan 2014 with abd pain and had gas and fluid in pancreatic bed. She refused surgery ("would not make it") and had fluid aspirated by IR- gram stain showed yeast and cultures grew enterococcus species, sensitive to amp and vanc. She was seen by ID and discharged on IV vanc/fortaz with HD and po voriconazole.     [name] ESRD on hemodialysis 05/06/2012    T,Th,Sat HD by [employer] [facility] on [address]. Started hemodialysis around July 2013.    [name] CHF (congestive heart failure)   . Complication of vascular access for  dialysis 07/13/2012    L arm AVG (placed by Dr. Lyda Perone @ [facility]10/25) was ligated 1/6 after significant L arm swelling secondary to L SCV stenosis and pacemaker; new access to be placed at right arm on 1/29 by Dr. Hollace Hayward    . NSTEMI (non-ST elevated myocardial infarction) 10/08/12    cardiac cath with no obvious acute lesion, medical therapy    Past Surgical History  Procedure Laterality Date  . Esophagogastroduodenoscopy  09/15/2011    Procedure: ESOPHAGOGASTRODUODENOSCOPY (EGD);  Surgeon: Theda Belfast, MD;  Location: [facility]  ENDOSCOPY;  Service: Endoscopy;  Laterality: N/A;  . Cholecystectomy  2003  . Tonsillectomy and adenoidectomy  1961  . Appendectomy  1973?  [name] Vaginal hysterectomy  1973?  [name] Tubal ligation  1972  . Dilation and curettage of uterus      "bunch from profuse bleeding in the 1970's" (05/23/2012)  . Coronary artery bypass graft  2008    CABG X4  . Coronary angioplasty with stent placement  2008    "1; day after CABG" (05/23/2012)  . Cardiac catheterization      "before 2008 and 3 wk ago" (05/23/2012)  . Insert / replace / remove pacemaker  06/2011    pacemaker ICD  . Cardiac defibrillator placement  06/2011  . Arteriovenous graft placement  03/2012    right antecub  . Insertion of dialysis catheter  ~ 10/2011    right chest  . Peritoneal catheter insertion  08/2011  . Peritoneal catheter removal  ~ 10/2011    Jarold Motto MS, RD, LDN Pager: [phone] After-hours pager: [phone]

## 2012-11-03 NOTE — Progress Notes (Signed)
CBG: 67  Treatment: 15 Carbohydrate Snack  Symptoms: none  Follow-up CBG: Time:18:14 CBG Result:70 Possible Reasons for Event: Inadequate meal intake  Comments/MD notified: Dr. Toniann Fail made aware. Will continue to monitor patient and encourage PO nutrition intake.  In addition, upon patient regaining IV access, she will be started on D10 as ordered at rate of 75mL/hr.

## 2012-11-03 NOTE — Progress Notes (Signed)
Patient ID: [name], female   DOB: May 16, 1951, 62 y.o.   MRN: [id number] Pt presented to [place] dept today for paracentesis. On limited US abd all four quadrants there is minimal ascites noted and primarily perihepatic and multiseptated. Findings d/w renal service and decision made to cancel paracentesis for now.

## 2012-11-03 NOTE — Progress Notes (Signed)
[facility] PROGRESS NOTE  [name] JYN:[id number] DOB: 03/17/1951 DOA: 11/02/2012 PCP: Jyl Heinz, MD  [name] is a 62 y.o. female with known history of ischemic cardiomyopathy, CAD status post CABG and stenting, ESRD on hemodialysis Monday Wednesday and Friday was brought to the ER patient has been having abdominal pain. Patient states that the pain has been present for the last 24 hours and is mostly in the lower quadrants and has been having nausea and vomiting. Denies any chest pain shortness of breath productive cough fever chills. CT abdomen and pelvis shows large ascites and there possible infiltrates in the lung for which patient has been started on antibiotics for health care associated pneumonia. In addition patient has been found to be hypoglycemic. For which D50 has been given and CBG has increased to 136. Patient will be admitted for further management. Patient last month was admitted for non-ST elevation MI and cardiac catheter was done and medical management was planned. Patient also recently was admitted for CHF decompensation.   Assessment/Plan:  Abdominal pain with nausea vomiting Etiology not clear-but SBP or constipation (on narcotics) the likely etiologies.   patient has been already started the antibiotics for pneumonia which will also cover for possible SBP.  Therapeutic and diagnostic paracentesis has been ordered. Abdomen firm - likely constipated as well will order dulcolax suppository and check Abdomen XRay  Hypoglycemia cbg 46 in the ED, then 52 at approx 8 am this morning.   secondary to poor oral intake secondary to nausea vomiting, and also from underlying liver cirrhosis CBG every 2 hours. D50 ordered PRN. No history of DM listed.  Possible health care associated pneumonia   CT shows infiltrates in the right lung. These appear to have been present on previous exams.  Pt afebrile with elevated WBC. Patient is placed on vancomycin  cefepime and Levaquin. Blood cultures ordered after antibiotics were started.  CAD status post CABG and stenting  Recent cardiac catheter after patient had slow V. tach and non-ST MI presently denies any chest pain.  On tele Continue home medications.  ESRD  on hemodialysis-4 times a week nephrologist consulted for dialysis.  Severe Cardiomyopathy-Ischemic  AICD in place        EF 20-25% echo March 2014, 10% by cath (05/06/12)   CAD       CABG X 4 in 2/08,She had a NSTEMI in April of this year in the setting of slow VT.        C/w Amiodarone       On ASA, Coreg         Decreased level of consciousness Etiology:  Hypoglycemia, infection, pain medications? Dilaudid d/c  Slow drip of D10 in NS On antibiotics.  Ethics Given patient's severe chronic medical conditions-ESRD, Severe cardiomyopathy and Advanced Cirrhosis-very poor overall prognosis-will get Palliative Medicine consultation. If agreeable will be a great candidate for residential hospice placement  Code Status: Full code.  Need to discuss further with patient and family. Family Communication:  Disposition Plan: inpatient.   Consultants:  Renal  Palliative Medicine consultation requested.  Procedures:  Scheduled for paracentesis  HD per Renal  Antibiotics: vancomycin cefepime and Levaquin. 5/22  HPI/Subjective: Patient complaining of abdominal pain.  Objective: Filed Vitals:   11/03/12 0500 11/03/12 0515 11/03/12 0530 11/03/12 0700  BP: 92/52  94/70 100/62  Pulse: 65 70  66  Temp:    97.6 F (36.4 C)  TempSrc:    Oral  Resp: 16 16  18  Height:    5\' 4"  (1.626 m)  Weight:    50.395 kg (111 lb 1.6 oz)  SpO2: 99% 100%  100%   No intake or output data in the 24 hours ending 11/03/12 0908 Filed Weights   11/03/12 0700  Weight: 50.395 kg (111 lb 1.6 oz)    Exam:   General:  Elderly, frail, weak appearing female, appears older than stated age.   Awakens but drifts off back to sleep  quickly.  Cardiovascular: rrr, 3/6 systolic murmur.  Monitor shows asystole  Respiratory: poor inspiration effort, weak cough, no w/c/r  Abdomen: firm, flat, tender to palpation, decreased bowel sounds  Neuro:  CN 2-12 grossly intact.  Patient follows commands but appears to have a decreased LOC.   Data Reviewed: Basic Metabolic Panel:  Recent Labs Lab 11/02/12 2249 11/02/12 2313  NA 138 139  K 4.3 4.4  CL 101 105  CO2 25  --   GLUCOSE 50* 46*  BUN 33* 30*  CREATININE 2.42* 2.60*  CALCIUM 9.1  --    Liver Function Tests:  Recent Labs Lab 11/02/12 2249  AST 24  ALT 21  ALKPHOS 188*  BILITOT 0.4  PROT 6.4  ALBUMIN 1.7*    Recent Labs Lab 11/02/12 2249  LIPASE 7*    Recent Labs Lab 11/03/12 0332  AMMONIA 33   CBC:  Recent Labs Lab 11/02/12 2249 11/02/12 2313  WBC 15.0*  --   NEUTROABS 13.5*  --   HGB 11.7* 12.9  HCT 37.1 38.0  MCV 89.8  --   PLT 163  --      Recent Labs  10/09/12 0330 10/09/12 0955 10/15/12 0926  PROBNP >70000.0* >70000.0* >70000.0*   CBG:  Recent Labs Lab 11/03/12 0120 11/03/12 0801  GLUCAP 139* 52*      Studies: Ct Abdomen Pelvis Wo Contrast  11/03/2012   *RADIOLOGY REPORT*  Clinical Data: Abdominal pain.  End-stage renal disease.  CT ABDOMEN AND PELVIS WITHOUT CONTRAST  Technique:  Multidetector CT imaging of the abdomen and pelvis was performed following the standard protocol without intravenous contrast.  Comparison: CT scan 10/15/2012.  Findings: Stable cirrhotic changes involving the liver with massive abdominal/pelvic ascites, unchanged since recent study.  No obvious small bowel obstruction.  Severe atherosclerotic calcifications involving the abdominal vasculature.  The kidneys demonstrate chronic changes of renal failure with cystic changes, scarring and calcification.  No hydronephrosis.  Diffuse body wall edema is again demonstrated.  Persistent right pleural effusion and right lower lobe infiltrate.   IMPRESSION:  1.  Persistent right pleural effusion and right lower lobe infiltrate. 2.  Cirrhosis and massive abdominal/pelvic ascites. 3.  Severe atherosclerotic calcifications. 4.  Chronic renal disease but no hydronephrosis.   Original Report Authenticated By: Rudie Meyer, M.D.   Dg Chest Port 1 View  11/02/2012   *RADIOLOGY REPORT*  Clinical Data: Swelling in feet and chest pain.  PORTABLE CHEST - 1 VIEW  Comparison: 10/16/2012.  Findings: The heart is enlarged but stable.  The pacer wires / AICD are unchanged.  The right IJ diatek catheter is stable.  The lungs are clear.  No edema or effusions.  IMPRESSION:  1.  Cardiac enlargement, stable. 2.  Stable support apparatus. 3.  No acute pulmonary findings.   Original Report Authenticated By: Rudie Meyer, M.D.    Scheduled Meds: . amiodarone  200 mg Oral BID  . aspirin EC  81 mg Oral Daily  . bisacodyl  10 mg Rectal Once  .  carvedilol  4.6875 mg Oral BID WC  . ceFEPime (MAXIPIME) IV  2 g Intravenous Once  . cinacalcet  60 mg Oral Q breakfast  . docusate sodium  100 mg Oral BID  . feeding supplement (NEPRO CARB STEADY)  237 mL Oral BID WC  . heparin  5,000 Units Subcutaneous Q8H  . [START ON 11/05/2012] levofloxacin (LEVAQUIN) IV  500 mg Intravenous Q48H  . levofloxacin (LEVAQUIN) IV  750 mg Intravenous Once  . methadone  5 mg Oral QID  . multivitamin  1 tablet Oral Daily  . pantoprazole  40 mg Oral Daily  . ranolazine  500 mg Oral Daily  . sevelamer carbonate  400 mg Oral TID WC  . simvastatin  10 mg Oral q1800  . sodium chloride  3 mL Intravenous Q12H  . sodium chloride  3 mL Intravenous Q12H  . vancomycin  1,000 mg Intravenous Once   Continuous Infusions:   Principal Problem:   Abdominal pain Active Problems:   Chronic systolic congestive heart failure, NYHA class 3   ESRD on hemodialysis   CAD, CABG X 4 Feb '08, LAD stent June '08- cath 4/14 - med Rx   Ascites   Healthcare-associated pneumonia   [place]  [facility] Pager [phone]. If 7PM-7AM, please contact night-coverage at [email or web address], password [facility] 11/03/2012, 9:08 AM  LOS: 1 day   Attending Patient seen and examined, Complicated-but with severe and likely end-stage ischemic cardiomyopathy, Liver Cirrhosis, ESRD on HD X 4 weekly admitted with abdominal pain. Not sure whether this is SBP or from constipation. Await Paracentesis and Abd Xray, c/w Antibiotics. Very poor prognosis-will benefit from residential hospice placement if patient and family willing.  S Ghimire

## 2012-11-04 DIAGNOSIS — R630 Anorexia: Secondary | ICD-10-CM

## 2012-11-04 DIAGNOSIS — N186 End stage renal disease: Secondary | ICD-10-CM

## 2012-11-04 DIAGNOSIS — Z515 Encounter for palliative care: Secondary | ICD-10-CM

## 2012-11-04 DIAGNOSIS — R1012 Left upper quadrant pain: Secondary | ICD-10-CM

## 2012-11-04 LAB — CBC WITH DIFFERENTIAL/PLATELET
Basophils Relative: 0 % (ref 0–1)
Eosinophils Absolute: 0 10*3/uL (ref 0.0–0.7)
Eosinophils Relative: 0 % (ref 0–5)
Hemoglobin: 9.6 g/dL — ABNORMAL LOW (ref 12.0–15.0)
Lymphs Abs: 0.7 10*3/uL (ref 0.7–4.0)
MCH: 28.2 pg (ref 26.0–34.0)
MCHC: 31.3 g/dL (ref 30.0–36.0)
MCV: 90.3 fL (ref 78.0–100.0)
Monocytes Absolute: 0.7 10*3/uL (ref 0.1–1.0)
Platelets: 125 10*3/uL — ABNORMAL LOW (ref 150–400)
RBC: 3.4 MIL/uL — ABNORMAL LOW (ref 3.87–5.11)

## 2012-11-04 LAB — LIPASE, BLOOD: Lipase: 5 U/L — ABNORMAL LOW (ref 11–59)

## 2012-11-04 LAB — GLUCOSE, CAPILLARY
Glucose-Capillary: 141 mg/dL — ABNORMAL HIGH (ref 70–99)
Glucose-Capillary: 96 mg/dL (ref 70–99)
Glucose-Capillary: 62 mg/dL — ABNORMAL LOW (ref 70–99)

## 2012-11-04 LAB — RENAL FUNCTION PANEL
BUN: 42 mg/dL — ABNORMAL HIGH (ref 6–23)
CO2: 25 mEq/L (ref 19–32)
Chloride: 100 mEq/L (ref 96–112)
Creatinine, Ser: 2.87 mg/dL — ABNORMAL HIGH (ref 0.50–1.10)
Glucose, Bld: 77 mg/dL (ref 70–99)

## 2012-11-04 LAB — IRON AND TIBC: UIBC: 71 ug/dL — ABNORMAL LOW (ref 125–400)

## 2012-11-04 MED ORDER — GLUCOSE 40 % PO GEL
ORAL | Status: AC
  Administered 2012-11-04: 37.5 g
  Filled 2012-11-04: qty 1

## 2012-11-04 MED ORDER — DEXTROSE 50 % IV SOLN
INTRAVENOUS | Status: AC
  Administered 2012-11-04: 50 mL
  Filled 2012-11-04: qty 50

## 2012-11-04 MED ORDER — BISACODYL 10 MG RE SUPP: 10.0000 mg | Freq: Every day | RECTAL | Status: DC | PRN

## 2012-11-04 MED ORDER — DARBEPOETIN ALFA-POLYSORBATE 150 MCG/0.3ML IJ SOLN
INTRAMUSCULAR | Status: AC
  Filled 2012-11-04: qty 0.3

## 2012-11-04 MED ORDER — SEVELAMER HCL 400 MG PO TABS
400.0000 mg | ORAL_TABLET | Freq: Three times a day (TID) | ORAL | Status: DC
  Administered 2012-11-05: 400 mg via ORAL
  Filled 2012-11-04 (×4): qty 1

## 2012-11-04 MED ORDER — MINERAL OIL RE ENEM
1.0000 | ENEMA | Freq: Once | RECTAL | Status: AC
  Administered 2012-11-04: 1 via RECTAL
  Filled 2012-11-04: qty 1

## 2012-11-04 NOTE — Evaluation (Signed)
Physical Therapy Evaluation Patient Details Name: [name] MRN: [id number] DOB: 11/17/1950 Today's Date: 11/04/2012 Time: 4098-1191 PT Time Calculation (min): 38 min  PT Assessment / Plan / Recommendation Clinical Impression  [name] is a 62 y.o. female with known history of ischemic cardiomyopathy, CAD status post CABG and stenting, ESRD on hemodialysis Monday Wednesday and Friday was brought to the ER patient has been having abdominal pain. Patient states that the pain has been present for the last 24 hours and is mostly in the lower quadrants and has been having nausea and vomiting.  Acute PT to follow pt to promote safety with mobility.  Pt had palliative consult and it appears that pt will be discontinuing HD.      PT Assessment  Patient needs continued PT services    Follow Up Recommendations  No PT follow up;Supervision/Assistance - 24 hour    Does the patient have the potential to tolerate intense rehabilitation      Barriers to Discharge None      Equipment Recommendations       Recommendations for Other Services     Frequency Min 2X/week    Precautions / Restrictions Precautions Precautions: Fall Restrictions Weight Bearing Restrictions: No   Pertinent Vitals/Pain C/o abdominal pain.  Unable to rate. Premedicated.       Mobility  Bed Mobility Bed Mobility: Sit to Supine Sit to Supine: 3: Mod assist;HOB flat Details for Bed Mobility Assistance: Assist for bilateral LEs.   Transfers Transfers: Sit to Stand;Stand to Sit Sit to Stand: 3: Mod assist;From chair/3-in-1;With upper extremity assist;With armrests;4: Min assist Stand to Sit: 4: Min assist;To bed;To chair/3-in-1;With upper extremity assist Details for Transfer Assistance: Mod assist for first standing trial secondary LE weakness.  Pt fearful of falling.  Assist for controlled descent to chair/bed Ambulation/Gait Ambulation/Gait Assistance: 4: Min guard Ambulation Distance (Feet): 8 Feet  (8 feet x 2) Assistive device: Rolling walker Gait Pattern: Decreased stride length;Narrow base of support;Trunk flexed Stairs: No    Exercises     PT Diagnosis: Generalized weakness  PT Problem List: Decreased strength;Decreased activity tolerance;Decreased balance;Decreased mobility;Cardiopulmonary status limiting activity PT Treatment Interventions: DME instruction;Gait training;Functional mobility training;Therapeutic activities;Therapeutic exercise;Balance training;Patient/family education   PT Goals Acute Rehab PT Goals PT Goal Formulation: With patient Time For Goal Achievement: 11/18/12 Potential to Achieve Goals: Fair Pt will go Supine/Side to Sit: with supervision PT Goal: Supine/Side to Sit - Progress: Goal set today Pt will go Sit to Supine/Side: with supervision PT Goal: Sit to Supine/Side - Progress: Goal set today Pt will go Sit to Stand: with supervision PT Goal: Sit to Stand - Progress: Goal set today Pt will go Stand to Sit: with supervision PT Goal: Stand to Sit - Progress: Goal set today Pt will Ambulate: 51 - 150 feet;with supervision;with rolling walker PT Goal: Ambulate - Progress: Goal set today  Visit Information  Last PT Received On: 11/04/12 Assistance Needed: +1    Subjective Data  Subjective: I havn't been walking Patient Stated Goal: return home   Prior Functioning  Home Living Lives With: Family Available Help at Discharge: Available PRN/intermittently;Family;Personal care attendant Type of Home: Apartment Home Access: Level entry Home Layout: One level Bathroom Shower/Tub: Engineer, manufacturing systems: Standard Bathroom Accessibility: Yes How Accessible: Accessible via walker Home Adaptive Equipment: Bedside commode/3-in-1 Additional Comments: has a BSC that fits over her guest bath but will not fit over her toilet. She often cannot get all the way to guest bathroom Prior  Function Level of Independence: Needs assistance Needs  Assistance: Bathing;Dressing;Meal Prep;Light Housekeeping Bath: Minimal Dressing: Minimal Meal Prep: Total Light Housekeeping: Total Able to Take Stairs?: No Driving: No Vocation: Retired Musician: No difficulties    Copywriter, advertising Arousal/Alertness: Awake/alert Behavior During Therapy: WFL for tasks assessed/performed Overall Cognitive Status: Within Functional Limits for tasks assessed    Extremity/Trunk Assessment Right Upper Extremity Assessment RUE ROM/Strength/Tone: WFL for tasks assessed Left Upper Extremity Assessment LUE ROM/Strength/Tone: Deficits LUE ROM/Strength/Tone Deficits: HD port Right Lower Extremity Assessment RLE ROM/Strength/Tone: Deficits RLE ROM/Strength/Tone Deficits: grossly 3/5 throughout RLE Sensation: WFL - Light Touch;WFL - Proprioception RLE Coordination: WFL - gross motor Left Lower Extremity Assessment LLE ROM/Strength/Tone: Deficits LLE ROM/Strength/Tone Deficits: grossly 3/5 throughout LLE Sensation: WFL - Light Touch;WFL - Proprioception LLE Coordination: WFL - gross motor Trunk Assessment Trunk Assessment: Kyphotic   Balance Balance Balance Assessed: No  End of Session PT - End of Session Equipment Utilized During Treatment: Gait belt Activity Tolerance: Patient limited by fatigue Patient left: in bed;with call bell/phone within reach;with family/visitor present Nurse Communication: Mobility status  GP     Mathilda Maguire 11/04/2012, 5:50 PM Hendrik Donath L. Chilton Si, DPT   Pager [phone]     Cell [phone]

## 2012-11-04 NOTE — Progress Notes (Signed)
Subjective: FEeling a little better  Objective Vital signs in last 24 hours: Filed Vitals:   11/04/12 0230 11/04/12 0401 11/04/12 0828 11/04/12 0922  BP: 78/42 89/45 104/58 105/55  Pulse: 68 75 72 68  Temp:  97.8 F (36.6 C)  97.9 F (36.6 C)  TempSrc:  Oral  Oral  Resp: 22 20  19   Height:      Weight:      SpO2:  100%  100%   Weight change: -0.272 kg (-9.6 oz)  Intake/Output Summary (Last 24 hours) at 11/04/12 1024 Last data filed at 11/04/12 0209  Gross per 24 hour  Intake    240 ml  Output    428 ml  Net   -188 ml   Labs: Basic Metabolic Panel:  Recent Labs Lab 11/02/12 2249 11/02/12 2313 11/03/12 1200 11/03/12 2355  NA 138 139 137 135  K 4.3 4.4 4.8 4.6  CL 101 105 102 100  CO2 25  --  20 25  GLUCOSE 50* 46* 108* 77  BUN 33* 30* 35* 42*  CREATININE 2.42* 2.60* 2.68* 2.87*  CALCIUM 9.1  --  9.2 9.4  PHOS  --   --   --  4.5   Liver Function Tests:  Recent Labs Lab 11/02/12 2249 11/03/12 1200 11/03/12 2355  AST 24 21  --   ALT 21 20  --   ALKPHOS 188* 170*  --   BILITOT 0.4 0.3  --   PROT 6.4 5.5*  --   ALBUMIN 1.7* 1.4* 1.4*    Recent Labs Lab 11/02/12 2249 11/04/12 0630  LIPASE 7* 5*    Recent Labs Lab 11/03/12 0332  AMMONIA 33   CBC:  Recent Labs Lab 11/02/12 2249 11/02/12 2313 11/03/12 1200 11/03/12 2355  WBC 15.0*  --  12.9* 13.5*  NEUTROABS 13.5*  --   --  12.1*  HGB 11.7* 12.9 10.1* 9.6*  HCT 37.1 38.0 32.3* 30.7*  MCV 89.8  --  89.5 90.3  PLT 163  --  124* 125*   PT/INR: @LABRCNTIP (inr:5)   Scheduled Meds ) . amiodarone  200 mg Oral BID  . aspirin EC  81 mg Oral Daily  . bisacodyl  10 mg Rectal Once  . carvedilol  4.6875 mg Oral BID WC  . ceFEPime (MAXIPIME) IV  2 g Intravenous Q M,W,F-HD  . cinacalcet  60 mg Oral Q breakfast  . darbepoetin (ARANESP) injection - DIALYSIS  150 mcg Intravenous Q Thu-HD  . docusate sodium  100 mg Oral BID  . feeding supplement (NEPRO CARB STEADY)  237 mL Oral BID WC  . feeding  supplement  1 Container Oral BID BM  . heparin  5,000 Units Subcutaneous Q8H  . lactulose  10 g Oral BID  . [START ON 11/05/2012] levofloxacin (LEVAQUIN) IV  500 mg Intravenous Q48H  . methadone  5 mg Oral QID  . multivitamin  1 tablet Oral Daily  . pantoprazole  40 mg Oral Daily  . polyethylene glycol  17 g Oral Daily  . ranolazine  500 mg Oral Daily  . [START ON 11/05/2012] sevelamer  400 mg Oral TID WC  . sevelamer carbonate  400 mg Oral TID WC  . simvastatin  10 mg Oral q1800  . sodium chloride  3 mL Intravenous Q12H  . sodium chloride  3 mL Intravenous Q12H  . vancomycin  500 mg Intravenous Q M,W,F-HD    Physical Exam:  Blood pressure 105/55, pulse 68, temperature 97.9 F (36.6  C), temperature source Oral, resp. rate 19, height 5\' 4"  (1.626 m), weight 50.5 kg (111 lb 5.3 oz), SpO2 100.00%.  General: Cachectic, tearful, in obvious discomfort.  Head: Normocephalic, atraumatic, sclera non-icteric, mucus membranes are moist  Neck: Supple. JVD not elevated.  Lungs: Mostly clear. No without wheezes, rales, or rhonchi. Breathing is unlabored.  Heart: RRR S1/S2. AICD left chest. No murmurs, rubs, or gallops appreciated.  Abdomen: Distended. Diffusely tender, esp in LUQ and suprapubic region, +guarding. Normoactive bowel sounds. No obvious abdominal masses.  M-S: Strength and tone somewhat diminished.  Lower extremities:3+ LE pitting edema. No ischemic changes or open wounds  Neuro: Alert and oriented X 3. Moves all extremities spontaneously.  Psych: Responds to questions appropriately with a normal affect.  Dialysis Access: Rt I-J   Dialysis Orders: Center: [facility] on [facility].  EDW 45.5 kg HD Bath 3K/2.25Ca Time 4:00 Heparin 1400 u. Access R I-J BFR 400 DFR A1.5  Hectorol 0 mcg IV/HD Epogen 20000 Units IV/HD Venofer 0   Assessment/Plan:  1. Abdominal Pain/Nausea/Vomiting - +ascites on CT in patient with known cirrhosis, chronic pancreatitis and hx of pancreatic bed infections.  "Septated"  fluid collections on [place] per IR yest, they were not able to do a large volume paracentesis.  On pain meds and empiric IV abx.  2. ESRD - [facility] HD. K+4.8. Plan for HD tomorrow, just 3 hours 3. Hypertension/volume - SBPs 90's. Unable to remove any fluid at HD last night, BP dropped into 50's.  5 kg up but CXR is clear, first time in over 6 mos. She probably has a new higher dry weight due to lean body weight gain and ascites.  Says she has been eating better since a family member moved in with her 4. Anemia - Hgb 10.1 on op Epo 20000u. Will start Aranesp 150. No op IV Fe or recent iron studies. 5. Metabolic bone disease - Ca 9.2 (11.2 corrected) Low Ca bath, continue Renvela. Last PTH 216.7 on 5/9. No Vit D. 6. Nutrition - Severely malnourished. On clears for now. Liberalized diet with fluid restriction ok when appropriate. Multivitamin, breeze, nepro. 7. Limited code / partial DNR    Vinson Moselle  MD [phone] pgr    [phone] cell 11/04/2012, 10:24 AM

## 2012-11-04 NOTE — Progress Notes (Signed)
PATIENT DETAILS Name: [name] Age: 62 y.o. Sex: female Date of Birth: 06-17-1950 Admit Date: 11/02/2012 Admitting Physician Eduard Clos, MD [id number], ROSS B, MD  Subjective: Better today-but still very weak, Hypotensive with HD  Assessment/Plan: Principal Problem: Abdominal pain with nausea vomiting -initial suspicion was for SBP-however no free asitic fluid present to do paracentesis, abdominal pain could be 2/2 constipation or ?flare of her chronic pancreatitis -will attempt to treat constipation and see if she continues to improve -c/w narcotics for comfort/pain control  Hypoglycemia -secondary to poor oral intake secondary to nausea vomiting, and also from underlying liver cirrhosis -prn Dextrose-still persists intermittently -CBG's every 2 hours-till CBG's stable  HCAP -afebrile -WBC down slightly -stop Levaquin-c/w Vanco and Cefepime  Severe Cardiomyopathy-Ischemic  -AICD in place  -EF 20-25% echo March 2014, 10% by cath (05/06/12) -Diuresis with HD -on low dose Coreg-BP permitting-not on ACEI due to hypotension  ESRD  -complicated by Hypotension -per nephrology  Hx of CAD -stable at present -last LHC on 4/29 showed progressive CAD-but no culprit lesion for NSTEMI on April 2014 -on ASA, Coreg, Zocor  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 If agreeable will be a great candidate for residential hospice placement. Palliative care meeting 11/04/12.  Disposition: Remain  inpatient  DVT Prophylaxis: Prophylactic Heparin   Code Status: DNR confirmed with patient today  Family Communication None at bedside  Procedures:  None  CONSULTS:  nephrology   MEDICATIONS: Scheduled Meds: . amiodarone  200 mg Oral BID  . aspirin EC  81 mg Oral Daily  . bisacodyl  10 mg Rectal Once  . carvedilol  4.6875 mg Oral BID WC  . ceFEPime (MAXIPIME) IV  2 g Intravenous Q M,W,F-HD  . cinacalcet  60 mg Oral Q breakfast  . darbepoetin (ARANESP) injection - DIALYSIS  150 mcg Intravenous Q Thu-HD  . docusate sodium  100 mg Oral BID  . feeding supplement (NEPRO CARB STEADY)  237 mL Oral BID WC  . feeding supplement  1 Container Oral BID BM  . heparin  5,000 Units Subcutaneous Q8H  . lactulose  10 g Oral BID  . [START ON 11/05/2012] levofloxacin (LEVAQUIN) IV  500 mg Intravenous Q48H  . methadone  5 mg Oral QID  . multivitamin  1 tablet Oral Daily  . pantoprazole  40 mg Oral Daily  . polyethylene glycol  17 g Oral Daily  . ranolazine  500 mg Oral Daily  . [START ON 11/05/2012] sevelamer  400 mg Oral TID WC  . sevelamer carbonate  400 mg Oral TID WC  . simvastatin  10 mg Oral q1800  . sodium chloride  3 mL Intravenous Q12H  . sodium chloride  3 mL Intravenous Q12H  . vancomycin  500 mg Intravenous Q M,W,F-HD   Continuous Infusions: . dextrose 50 mL/hr (11/04/12 0614)   PRN Meds:.acetaminophen, acetaminophen, dextrose, HYDROmorphone (DILAUDID) injection, nitroGLYCERIN, ondansetron (ZOFRAN) IV, ondansetron  Antibiotics: Anti-infectives   Start     Dose/Rate Route Frequency Ordered Stop   11/05/12 0900  levofloxacin (LEVAQUIN) IVPB 500 mg     500 mg 100 mL/hr over 60 Minutes Intravenous  Every 48 hours 11/03/12 0809     11/04/12 1200  ceFEPIme (MAXIPIME) 2 g in dextrose 5 % 50 mL IVPB     2 g 100 mL/hr over 30 Minutes Intravenous Every M-W-F (Hemodialysis) 11/03/12 1409     11/04/12 1200  vancomycin (VANCOCIN) 500 mg in sodium chloride 0.9 % 100 mL IVPB      500 mg 100 mL/hr over 60 Minutes Intravenous Every M-W-F (Hemodialysis) 11/03/12 1409     11/03/12 1100  vancomycin (VANCOCIN) IVPB 1000 mg/200 mL premix  Status:  Discontinued     1,000 mg 200 mL/hr over 60 Minutes Intravenous  Once 11/03/12 0807 11/03/12 1403   11/03/12 0930  ceFEPIme (MAXIPIME) 2 g in dextrose 5 % 50 mL IVPB     2 g 100 mL/hr over 30 Minutes Intravenous  Once 11/03/12 0807 11/03/12 1319   11/03/12 0815  levofloxacin (LEVAQUIN) IVPB 750 mg     750 mg 100 mL/hr over 90 Minutes Intravenous  Once 11/03/12 0806 11/03/12 1140   11/03/12 0300  vancomycin (VANCOCIN) IVPB 1000 mg/200 mL premix     1,000 mg 200 mL/hr over 60 Minutes Intravenous  Once 11/03/12 0258 11/03/12 0602   11/03/12 0300  piperacillin-tazobactam (ZOSYN) IVPB 3.375 g     3.375 g 12.5 mL/hr over 240 Minutes Intravenous  Once 11/03/12 0258 11/03/12 0412       PHYSICAL EXAM: Vital signs in last 24 hours: Filed Vitals:   11/04/12 0230 11/04/12 0401 11/04/12 0828 11/04/12 0922  BP: 78/42 89/45 104/58 105/55  Pulse: 68 75 72 68  Temp:  97.8 F (36.6 C)  97.9 F (36.6 C)  TempSrc:  Oral  Oral  Resp: 22 20  19   Height:      Weight:      SpO2:  100%  100%    Weight change: -0.272 kg (-9.6 oz) Filed Weights   11/03/12 2035 11/03/12 2300 11/04/12 0209  Weight: 50.122 kg (110 lb 8 oz) 50.8 kg (111 lb 15.9 oz) 50.5 kg (111 lb 5.3 oz)   Body mass index is 19.1 kg/(m^2).   Gen Exam: Awake and alert with clear speech. Cachectic Neck: Supple, No JVD.   Chest: B/L Clear.   CVS: S1 S2 Regular, no murmurs.  Abdomen: soft, BS +, still diffusely tender with no rebound, non distended.  Extremities: no edema, lower extremities warm to touch. Neurologic: Non Focal.   Skin: No Rash.   Wounds: N/A.   Intake/Output from previous day:  Intake/Output Summary (Last 24 hours) at 11/04/12 1315 Last data filed at 11/04/12 0209  Gross per 24 hour  Intake    120 ml  Output    428 ml  Net   -308 ml     LAB  RESULTS: CBC  Recent Labs Lab 11/02/12 2249 11/02/12 2313 11/03/12 1200 11/03/12 2355  WBC 15.0*  --  12.9* 13.5*  HGB 11.7* 12.9 10.1* 9.6*  HCT 37.1 38.0 32.3* 30.7*  PLT 163  --  124* 125*  MCV 89.8  --  89.5 90.3  MCH 28.3  --  28.0 28.2  MCHC 31.5  --  31.3 31.3  RDW 17.9*  --  17.8* 18.0*  LYMPHSABS 0.9  --   --  0.7  MONOABS 0.6  --   --  0.7  EOSABS 0.0  --   --  0.0  BASOSABS 0.0  --   --  0.0    Chemistries   Recent Labs Lab 11/02/12 2249 11/02/12  2313 11/03/12 1200 11/03/12 2355  NA 138 139 137 135  K 4.3 4.4 4.8 4.6  CL 101 105 102 100  CO2 25  --  20 25  GLUCOSE 50* 46* 108* 77  BUN 33* 30* 35* 42*  CREATININE 2.42* 2.60* 2.68* 2.87*  CALCIUM 9.1  --  9.2 9.4    CBG:  Recent Labs Lab 11/04/12 0515 11/04/12 0536 11/04/12 0557 11/04/12 0806 11/04/12 1224  GLUCAP 53* 60* 162* 141* 96    GFR Estimated Creatinine Clearance: 16 ml/min (by C-G formula based on Cr of 2.87).  Coagulation profile No results found for this basename: INR, PROTIME,  in the last 168 hours  Cardiac Enzymes No results found for this basename: CK, CKMB, TROPONINI, MYOGLOBIN,  in the last 168 hours  No components found with this basename: POCBNP,  No results found for this basename: DDIMER,  in the last 72 hours No results found for this basename: HGBA1C,  in the last 72 hours No results found for this basename: CHOL, HDL, LDLCALC, TRIG, CHOLHDL, LDLDIRECT,  in the last 72 hours No results found for this basename: TSH, T4TOTAL, FREET3, T3FREE, THYROIDAB,  in the last 72 hours  Recent Labs  11/03/12 2355  TIBC Not calculated due to Iron <10.  IRON <10*    Recent Labs  11/02/12 2249 11/04/12 0630  LIPASE 7* 5*    Urine Studies No results found for this basename: UACOL, UAPR, USPG, UPH, UTP, UGL, UKET, UBIL, UHGB, UNIT, UROB, ULEU, UEPI, UWBC, URBC, UBAC, CAST, CRYS, UCOM, BILUA,  in the last 72 hours  MICROBIOLOGY: Recent Results (from the past 240  hour(s))  CULTURE, BLOOD (ROUTINE X 2)     Status: None   Collection Time    11/03/12 12:00 PM      Result Value Range Status   Specimen Description BLOOD RIGHT HAND   Final   Special Requests BOTTLES DRAWN AEROBIC AND ANAEROBIC 5CC   Final   Culture  Setup Time 11/03/2012 16:24   Final   Culture     Final   Value:        BLOOD CULTURE RECEIVED NO GROWTH TO DATE CULTURE WILL BE HELD FOR 5 DAYS BEFORE ISSUING A FINAL NEGATIVE REPORT   Report Status PENDING   Incomplete  CULTURE, BLOOD (ROUTINE X 2)     Status: None   Collection Time    11/03/12  2:00 PM      Result Value Range Status   Specimen Description BLOOD RIGHT ARM   Final   Special Requests BOTTLES DRAWN AEROBIC AND ANAEROBIC 10CC   Final   Culture  Setup Time 11/03/2012 21:34   Final   Culture     Final   Value:        BLOOD CULTURE RECEIVED NO GROWTH TO DATE CULTURE WILL BE HELD FOR 5 DAYS BEFORE ISSUING A FINAL NEGATIVE REPORT   Report Status PENDING   Incomplete    RADIOLOGY STUDIES/RESULTS: Ct Abdomen Pelvis Wo Contrast  11/03/2012   *RADIOLOGY REPORT*  Clinical Data: Abdominal pain.  End-stage renal disease.  CT ABDOMEN AND PELVIS WITHOUT CONTRAST  Technique:  Multidetector CT imaging of the abdomen and pelvis was performed following the standard protocol without intravenous contrast.  Comparison: CT scan 10/15/2012.  Findings: Stable cirrhotic changes involving the liver with massive abdominal/pelvic ascites, unchanged since recent study.  No obvious small bowel obstruction.  Severe atherosclerotic calcifications involving the abdominal vasculature.  The kidneys demonstrate chronic changes of  renal failure with cystic changes, scarring and calcification.  No hydronephrosis.  Diffuse body wall edema is again demonstrated.  Persistent right pleural effusion and right lower lobe infiltrate.  IMPRESSION:  1.  Persistent right pleural effusion and right lower lobe infiltrate. 2.  Cirrhosis and massive abdominal/pelvic ascites. 3.   Severe atherosclerotic calcifications. 4.  Chronic renal disease but no hydronephrosis.   Original Report Authenticated By: Rudie Meyer, M.D.   Ct Abdomen Pelvis W Contrast  10/15/2012   *RADIOLOGY REPORT*  Clinical Data: Lower abdominal pain with nausea and vomiting. Constipation.  Diabetic on dialysis over 1 year.  Prior appendectomy, hysterectomy and cholecystectomy.  CT ABDOMEN AND PELVIS WITH CONTRAST  Technique:  Multidetector CT imaging of the abdomen and pelvis was performed following the standard protocol during bolus administration of intravenous contrast.  Contrast: 80mL OMNIPAQUE IOHEXOL 300 MG/ML  SOLN  Comparison: 08/19/2012.  Findings: AICD is in place.  Marked cardiomegaly.  Diffuse ascites complex with a loculated appearance. In the pelvis, loculated ascites is displacing sigmoid colon posteriorly and small bowel away from the pelvis.  Areas of loculated free air surround the spleen.  This may represent residua of infection previously noted.  Upper left lateral abdominal wall abscess remains measuring 2.5 x 4.4 x 5.5 cm with a loculated septated appearance.  Overall dimensions without significant change.  Abnormal appearance left lobe liver.  This may represent alterations in vascular supply to the liver rather than primary mass.  Small kidneys with calcifications and cysts.  Atherosclerotic type changes aorta with ectasia.  Atherosclerotic type changes iliac arteries with ectasia and narrowing.  Prominent branch vessel atherosclerotic type changes.  Right pleural effusion.  Right base infiltrate/atelectasis. Minimal atelectatic changes left base.  No obvious pancreatic abnormality.  Hyperplasia adrenal glands.  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  IMPRESSION: Complex ascites.  In the pelvis this appears loculated and is compressing the sigmoid colon, bladder and displacing small bowel from the pelvis.  Areas of loculated free air surround the spleen.  This may represent residua of  infection previously noted.  Upper left lateral wall abscess is septated and has overall dimensions relatively similar to the prior exam.  Marked cardiomegaly.  Right pleural effusion.  Right base infiltrate/atelectasis. Minimal atelectatic changes left base.  Significant atherosclerotic type changes.  Renal osteodystrophy changes of the spine.   Original Report Authenticated By: Lacy Duverney, M.D.   US Abdomen Limited  11/04/2012   LIMITED ULTRASOUND ABDOMEN  Date of procedure 11/03/2012    Clinical data:  Patient with history of end-stage renal disease, congestive heart failure and ascites. Request is made for paracentesis.  Findings:  Limited ultrasound of the abdomen in all four quadrants today reveals only minimal ascites.   There is a small amount of perihepatic and multiloculated ascites noted.  Findings discussed with primary service and decision made to cancel paracentesis amount.  Impression:  Limited ultrasound abdomen all four quadrants reveals minimal ascites, primary perihepatic and multiloculated. Following discussion with ordering physician decision made to cancel paracentesis for now.  Read by: Jeananne Rama, P.A.-C   Original Report Authenticated By: D. Andria Rhein, MD   US Paracentesis  10/17/2012   *RADIOLOGY REPORT*  Clinical Data: Recurrent abdominal ascites  ULTRASOUND GUIDED PARACENTESIS  Comparison:  Previous paracentesis  An ultrasound guided paracentesis was thoroughly discussed with the patient and questions answered.  The benefits, risks, alternatives and complications were also discussed.  The patient understands and wishes to proceed with the procedure.  Written consent was obtained.  Ultrasound was performed to localize and mark an adequate pocket of fluid in the left lower quadrant of the abdomen.  The area was then prepped and draped in the normal sterile fashion.  1% Lidocaine was used for local anesthesia.  Under ultrasound guidance a 19 gauge Yueh catheter was introduced.   Paracentesis was performed.  The catheter was removed and a dressing applied.  Complications:  None  Findings:  A total of approximately 1160 ml of cloudy bloody ascitic fluid was removed.  A fluid sample was sent for laboratory analysis.  IMPRESSION: Successful ultrasound guided paracentesis yielding 1160 ml of ascites.  Read by Brayton El PA-C   Original Report Authenticated By: Irish Lack, M.D.   Dg Chest Port 1 View  11/02/2012   *RADIOLOGY REPORT*  Clinical Data: Swelling in feet and chest pain.  PORTABLE CHEST - 1 VIEW  Comparison: 10/16/2012.  Findings: The heart is enlarged but stable.  The pacer wires / AICD are unchanged.  The right IJ diatek catheter is stable.  The lungs are clear.  No edema or effusions.  IMPRESSION:  1.  Cardiac enlargement, stable. 2.  Stable support apparatus. 3.  No acute pulmonary findings.   Original Report Authenticated By: Rudie Meyer, M.D.   Dg Chest Port 1 View  10/16/2012   *RADIOLOGY REPORT*  Clinical Data: Pulmonary edema post hemodialysis.  PORTABLE CHEST - 1 VIEW  Comparison: 10/15/2012.  Findings: Right central line tip right atrium level.  Skin folds on the right suspected without pneumothorax.  Attention to this possibility follow-up.  AICD / pacemaker enters from the left.  Leads unchanged in position.  Post CABG.  Cardiomegaly.  Pulmonary edema.  Basilar infiltrate or right not excluded.  IMPRESSION: Cardiomegaly of pulmonary edema.  Right base infiltrate not excluded.  Prominent skin folds on the right suspected.  Please see above.   Original Report Authenticated By: Lacy Duverney, M.D.   Dg Chest Port 1 View  10/15/2012   *RADIOLOGY REPORT*  Clinical Data: Shortness of breath.  History of dialysis and CHF.  PORTABLE CHEST - 1 VIEW  Comparison: 10/08/2012  Findings: Right jugular dialysis catheter tip is in the right atrium.  There is a left cardiac ICD.  Heart remains enlarged for size.  Prominent interstitial densities suggest mild edema or vascular  congestion.  Limited evaluation of the right costophrenic angle and cannot exclude a small right pleural effusion.  IMPRESSION: Cardiomegaly with mild interstitial edema or vascular congestion.   Original Report Authenticated By: Richarda Overlie, M.D.   Dg Chest Port 1 View  10/08/2012   *RADIOLOGY REPORT*  Clinical Data: Shortness of breath  PORTABLE CHEST - 1 VIEW  Comparison: 08/19/2012  Findings: Pacing pad are noted over the chest, obscuring detail. Left-sided AICD in place.  Right IJ approach hemodialysis catheter tips over the right atrium.  Moderate enlargement of the cardiomediastinal silhouette again noted with central vascular congestion and hazy perihilar airspace opacity.  Trace right pleural effusion.  Findings are not significantly changed. Evidence of CABG again noted.  IMPRESSION: Stable findings of cardiomegaly with hazy pulmonary opacities and right pleural effusion that could indicate pulmonary edema.   Original Report Authenticated By: Christiana Pellant, M.D.   Dg Abd Portable 2v  11/03/2012   *RADIOLOGY REPORT*  Clinical Data: Abdominal pain, constipation  PORTABLE ABDOMEN - 2 VIEW  Comparison: CT abdomen pelvis dated 11/03/2012  Findings: Nonobstructive bowel gas pattern.  Moderate colonic stool burden.  Cholecystectomy clips.  Vascular calcifications.  Cardiomegaly.  Postsurgical changes related to prior CABG.  ICD leads, incompletely visualized.  IMPRESSION: Moderate colonic stool burden.   Original Report Authenticated By: Charline Bills, M.D.    Jeoffrey Massed, MD  [facility] Pager:[phone]  If 7PM-7AM, please contact night-coverage [email or web address] Password [email or web address] 11/04/2012, 1:15 PM   LOS: 2 days

## 2012-11-04 NOTE — Consult Note (Signed)
Patient RU:[id number] [name]      DOB: [birth year]      JXB:[id number]   Consult Note from the Palliative Medicine Team at [facility]    Consult Requested by:Dr Ghimire      PCP: Jyl Heinz, MD Reason for Consultation:Goals of Care     Phone Number:[phone]  Assessment of patients Current state: Thin, cachetic per staff oral intake of food/fluids minimal. Continues to have chronic abdominal pain related to ascites and pancreatic pseudocyst. Pain today is 8/10.   Reviewed chart, spoke with staff caring for patient proceeded to have Palliative Care meeting with patient and daughter  [name] [phone]). Daughter was at work and stated she could not participate. Continued to have discussion pertinent to Goals of care with patient with Palliative care social worker, [name] also participating.   Patient told us that she was not tolerating dialysis treatment very well since last discharge, she stated that her blood pressure was dropping low and was impacting length of treatments. She stated that she realizes that her body is having significant side effects from continuation of dialysis, and would want to stop if she could go back to her home and be made comfortable with the appropriate support. Patient understands that her life span would be limited to weeks once HD is stopped.  Patient indicated she wants to have both son and daughter be in the conversation with PMT to stop all aggressive therapy and transition to hospice care. PMT will coordinate meeting with them via phone over weekend or on Monday morning (Daughter [name] is supposedly coming to [place] this weekend). Patient wants to continue current level of care until the family conversation takes place.   We also talked about resuscitation and patient is in agreement with DNR/DNI status, she also will be willing to de-activate AICD once she has discussion with her children.   Patients daughter's fiance [name] at bedside, with  permission of patient we discussed her wishes with him. PMT was unable to reach daughter or son by phone, left message on daughters cell to call so she can coordinate discussion time also with patients son [name].   PMT/Plan: Discuss patients wishes with daughter and son to discontinue aggressive management including HD, hopefully by Monday morning.    Goals of Care/Scope of Treatment: 1.  Code Status:DNR/DNI  Continue current level of medical care until re-meeting with patient and her children  5. Disposition: pending discussion with patient and family in next few days.   3. Symptom Management:  1. Pain (Abdominal): Methadone 5 mg oral 4 times daily, IV Dilaudid 1 mg every 3 hours as needed  2. Bowel Regimen:Coalce 100 mg oral BID, Lactulose 10 GM twice daily, Miralax 17 GM daily   4. Psychosocial:Patient currently lives at her home with her daughters fiance who is assisting her with care, (daughter currently lives in [place], per patient plans on moving to [place] soon). Patients son, [name] [facility]), lives in [place].  5. Spiritual:consulted  Brief HPI:  Admitted from home on 11/03/12 with n/v, abdominal pain, related to ascites, found also to have lung infiltrates being treated for PNA. Patient is 62 yo AAF with multiple co-morbidities including CAD (s/p CABG), Cardiomyopathy (s/p AICD), CHF (EF 10% via cath 05/06/12), liver cirrhosis, ESRD on HD for past 2 years, chronic pancreatitis with abdominal pseudocyst. Has required intermittent paracentesis for relief of ascites. Patient has had multiple admission over past year. Most recently discharged on 10/15/12 treated that admission for exacerbation of  CHF, decision was made to increase HD to 4 x per week due to severe DCM/pulmonary edema.   ROS: + pain, + generalized weakness, +diminished oral intake    PMH:  Past Medical History  Diagnosis Date  . Coronary artery disease     s/p CABG 2008 with multiple PCIs  . Ischemic  cardiomyopathy     Cath 04/2012, significant calcifications  . History of nonadherence to medical treatment   . Systolic CHF     16/1096 echo EF 10%  . Esophageal varices   . Cirrhosis     hx of fatty liver per patient, has known ascites with repeated paracenteses at [facility] as of 2013, also +hx of esoph varices with banding in the past  . Pacemaker   . ICD (implantable cardiac defibrillator) in place   . Hypertension     "used to have HTN; now I'm low" (05/23/2012)  . NSTEMI (non-ST elevated myocardial infarction)     04/2012; Trop peaked to 1.39; 05/23/2012 pt denies ever having MI  . Pneumonia 02/2012    "first time ever" (05/23/2012)  . Chronic bronchitis 1970's thru 2002    "went away when I stopped smoking in 2002" (05/23/2012)  . Diabetes mellitus     Diet controlled  . History of blood transfusion     "alot of them" (05/23/2012)  . Iron (Fe) deficiency anemia     "severe" (05/23/2012)  . Pancreatitis, chronic 10/04/2011    Had severe gallstone pancreatitis in May 2003, no surgery, treated medically then returned in June 2003 for cholecystectomy and cyst gastrostomy for pancreatic pseudocyst. In March 2013 was admitted for hemorrhage into psueodcyst. In April 2013 was treated at [facility] for polymicrobial bacteremia (fungal, MRSA and enterobacter) felt to be due to ruptured pseudocyst, treated medically. Admitted Jan 2014 with abd pain and had gas and fluid in pancreatic bed. She refused surgery ("would not make it") and had fluid aspirated by IR- gram stain showed yeast and cultures grew enterococcus species, sensitive to amp and vanc. She was seen by ID and discharged on IV vanc/fortaz with HD and po voriconazole.     [name] ESRD on hemodialysis 05/06/2012    T,Th,Sat HD by [employer] [facility] on [address]. Started hemodialysis around July 2013.    [name] CHF (congestive heart failure)   . Complication of vascular access for dialysis 07/13/2012    L arm AVG (placed by Dr. Lyda Perone @ [facility]10[facility]25)  was ligated 1/6 after significant L arm swelling secondary to L SCV stenosis and pacemaker; new access to be placed at right arm on 1/29 by Dr. Hollace Hayward    . NSTEMI (non-ST elevated myocardial infarction) 10/08/12    cardiac cath with no obvious acute lesion, medical therapy     PSH: Past Surgical History  Procedure Laterality Date  . Esophagogastroduodenoscopy  09/15/2011    Procedure: ESOPHAGOGASTRODUODENOSCOPY (EGD);  Surgeon: Theda Belfast, MD;  Location: [facility] ENDOSCOPY;  Service: Endoscopy;  Laterality: N/A;  . Cholecystectomy  2003  . Tonsillectomy and adenoidectomy  1961  . Appendectomy  1973?  [name] Vaginal hysterectomy  1973?  [name] Tubal ligation  1972  . Dilation and curettage of uterus      "bunch from profuse bleeding in the 1970's" (05/23/2012)  . Coronary artery bypass graft  2008    CABG X4  . Coronary angioplasty with stent placement  2008    "1; day after CABG" (05/23/2012)  . Cardiac catheterization      "before 2008  and 3 wk ago" (05/23/2012)  . Insert / replace / remove pacemaker  06/2011    pacemaker ICD  . Cardiac defibrillator placement  06/2011  . Arteriovenous graft placement  03/2012    right antecub  . Insertion of dialysis catheter  ~ 10/2011    right chest  . Peritoneal catheter insertion  08/2011  . Peritoneal catheter removal  ~ 10/2011   I have reviewed the FH and SH and  If appropriate update it with new information. Allergies  Allergen Reactions  . Codeine Itching  . Morphine And Related Shortness Of Breath    SOB when given IV once "to me it was mild; I called out fast for help; never had to intubate" (05/23/2012)  . Ace Inhibitors Other (See Comments)    Unknown reaction but her physician stated that she can't take it (specifically lisinopril)  . Tylenol (Acetaminophen) Other (See Comments)    Patient states that the doctor told her she has a Fatty Liver.   Scheduled Meds: . amiodarone  200 mg Oral BID  . aspirin EC  81 mg Oral Daily  . bisacodyl  10 mg  Rectal Once  . carvedilol  4.6875 mg Oral BID WC  . ceFEPime (MAXIPIME) IV  2 g Intravenous Q M,W,F-HD  . cinacalcet  60 mg Oral Q breakfast  . darbepoetin (ARANESP) injection - DIALYSIS  150 mcg Intravenous Q Thu-HD  . docusate sodium  100 mg Oral BID  . feeding supplement (NEPRO CARB STEADY)  237 mL Oral BID WC  . heparin  5,000 Units Subcutaneous Q8H  . lactulose  10 g Oral BID  . methadone  5 mg Oral QID  . multivitamin  1 tablet Oral Daily  . pantoprazole  40 mg Oral Daily  . polyethylene glycol  17 g Oral Daily  . ranolazine  500 mg Oral Daily  . [START ON 11/05/2012] sevelamer  400 mg Oral TID WC  . sevelamer carbonate  400 mg Oral TID WC  . simvastatin  10 mg Oral q1800  . sodium chloride  3 mL Intravenous Q12H  . sodium chloride  3 mL Intravenous Q12H  . vancomycin  500 mg Intravenous Q M,W,F-HD   Continuous Infusions: . dextrose 50 mL/hr (11/04/12 0614)   PRN Meds:.acetaminophen, acetaminophen, dextrose, HYDROmorphone (DILAUDID) injection, nitroGLYCERIN, ondansetron (ZOFRAN) IV, ondansetron    BP 105/55  Pulse 68  Temp(Src) 97.9 F (36.6 C) (Oral)  Resp 19  Ht 5\' 4"  (1.626 m)  Wt 50.5 kg (111 lb 5.3 oz)  BMI 19.1 kg/m2  SpO2 100%   PPS: 30-40%     11/03/12 Albumin 1.4   Intake/Output Summary (Last 24 hours) at 11/04/12 1417 Last data filed at 11/04/12 0209  Gross per 24 hour  Intake    120 ml  Output    428 ml  Net   -308 ml   AOZ:HYQMV to admission 5/22                      Stool Softner: Colace BID, Lactulose BID, Miralax daily  Physical Exam:  General: Cachetic, ill-looking HEENT:  Buccal mucosa moist Chest: CTA, diminished at bases CVS: RRR Abdomen: slightly distended, tender upon mild palpation ULQ Ext:no pedal edema Neuro:oriented  Labs: CBC    Component Value Date/Time   WBC 13.5* 11/03/2012 2355   WBC 6.7 04/08/2011 1054   RBC 3.40* 11/03/2012 2355   RBC 3.40* 04/08/2011 1054   HGB 9.6* 11/03/2012 2355  HGB 10.1* 04/08/2011 1054    HCT 30.7* 11/03/2012 2355   HCT 30.1* 04/08/2011 1054   PLT 125* 11/03/2012 2355   PLT 232 04/08/2011 1054   MCV 90.3 11/03/2012 2355   MCV 88.6 04/08/2011 1054   MCH 28.2 11/03/2012 2355   MCH 29.7 04/08/2011 1054   MCHC 31.3 11/03/2012 2355   MCHC 33.5 04/08/2011 1054   RDW 18.0* 11/03/2012 2355   RDW 20.4* 04/08/2011 1054   LYMPHSABS 0.7 11/03/2012 2355   LYMPHSABS 1.4 04/08/2011 1054   MONOABS 0.7 11/03/2012 2355   MONOABS 0.4 04/08/2011 1054   EOSABS 0.0 11/03/2012 2355   EOSABS 0.2 04/08/2011 1054   BASOSABS 0.0 11/03/2012 2355   BASOSABS 0.0 04/08/2011 1054    BMET    Component Value Date/Time   NA 135 11/03/2012 2355   K 4.6 11/03/2012 2355   CL 100 11/03/2012 2355   CO2 25 11/03/2012 2355   GLUCOSE 77 11/03/2012 2355   BUN 42* 11/03/2012 2355   CREATININE 2.87* 11/03/2012 2355   CALCIUM 9.4 11/03/2012 2355   GFRNONAA 16* 11/03/2012 2355   GFRAA 19* 11/03/2012 2355    CMP     Component Value Date/Time   NA 135 11/03/2012 2355   K 4.6 11/03/2012 2355   CL 100 11/03/2012 2355   CO2 25 11/03/2012 2355   GLUCOSE 77 11/03/2012 2355   BUN 42* 11/03/2012 2355   CREATININE 2.87* 11/03/2012 2355   CALCIUM 9.4 11/03/2012 2355   PROT 5.5* 11/03/2012 1200   ALBUMIN 1.4* 11/03/2012 2355   AST 21 11/03/2012 1200   ALT 20 11/03/2012 1200   ALKPHOS 170* 11/03/2012 1200   BILITOT 0.3 11/03/2012 1200   GFRNONAA 16* 11/03/2012 2355   GFRAA 19* 11/03/2012 2355    CT scan of the Abdomen Reviewed/Impressions:11/03/12  IMPRESSION:  1. Persistent right pleural effusion and right lower lobe  infiltrate.  2. Cirrhosis and massive abdominal/pelvic ascites.  3. Severe atherosclerotic calcifications.  4. Chronic renal disease but no hydronephrosis.     Time In Time Out Total Time Spent with Patient Total Overall Time  12 noon 1:30p 90 min 100 min    Greater than 50%  of this time was spent counseling and coordinating care related to the above assessment and plan.  Freddie Breech,  CNS-C Palliative Medicine Team [facility] Health Team Phone: [phone] Pager: [phone]

## 2012-11-04 NOTE — Progress Notes (Signed)
Chaplain Note: Pt sitting on side of bed when I arrived. Pt stated she was glad to see me because I pray for her and it makes a big difference. Engaged in active and reflective listening as pt talked about the changes in her health. Pt stated "its different this time" and said she has to make some difficult decisions. Provided spiritual and emotional support and prayed with pt. Will continue to visit as needed.  Rutherford Nail Chaplain Resident

## 2012-11-04 NOTE — Progress Notes (Signed)
Pt put on hemodialysis and immediately dropped BP to 78/46. Pt was assymtomatic with drop and remained in UF at this time. BP continued to frequently be low. Dr. Lowell Guitar called for guidance with low BP and hemodialysis Tx. Dr. Lowell Guitar states to keep SBP > 72. Pts BP then dropped to 68/32 and she was taken out of UF.

## 2012-11-04 NOTE — Progress Notes (Signed)
Subjective: FEeling a little better  Objective Vital signs in last 24 hours: Filed Vitals:   11/04/12 0230 11/04/12 0401 11/04/12 0828 11/04/12 0922  BP: 78/42 89/45 104/58 105/55  Pulse: 68 75 72 68  Temp:  97.8 F (36.6 C)  97.9 F (36.6 C)  TempSrc:  Oral  Oral  Resp: 22 20  19   Height:      Weight:      SpO2:  100%  100%   Weight change: -0.272 kg (-9.6 oz)  Intake/Output Summary (Last 24 hours) at 11/04/12 1038 Last data filed at 11/04/12 0209  Gross per 24 hour  Intake    240 ml  Output    428 ml  Net   -188 ml   Labs: Basic Metabolic Panel:  Recent Labs Lab 11/02/12 2249 11/02/12 2313 11/03/12 1200 11/03/12 2355  NA 138 139 137 135  K 4.3 4.4 4.8 4.6  CL 101 105 102 100  CO2 25  --  20 25  GLUCOSE 50* 46* 108* 77  BUN 33* 30* 35* 42*  CREATININE 2.42* 2.60* 2.68* 2.87*  CALCIUM 9.1  --  9.2 9.4  PHOS  --   --   --  4.5   Liver Function Tests:  Recent Labs Lab 11/02/12 2249 11/03/12 1200 11/03/12 2355  AST 24 21  --   ALT 21 20  --   ALKPHOS 188* 170*  --   BILITOT 0.4 0.3  --   PROT 6.4 5.5*  --   ALBUMIN 1.7* 1.4* 1.4*    Recent Labs Lab 11/02/12 2249 11/04/12 0630  LIPASE 7* 5*    Recent Labs Lab 11/03/12 0332  AMMONIA 33   CBC:  Recent Labs Lab 11/02/12 2249 11/02/12 2313 11/03/12 1200 11/03/12 2355  WBC 15.0*  --  12.9* 13.5*  NEUTROABS 13.5*  --   --  12.1*  HGB 11.7* 12.9 10.1* 9.6*  HCT 37.1 38.0 32.3* 30.7*  MCV 89.8  --  89.5 90.3  PLT 163  --  124* 125*   PT/INR: @LABRCNTIP (inr:5)   Scheduled Meds ) . amiodarone  200 mg Oral BID  . aspirin EC  81 mg Oral Daily  . bisacodyl  10 mg Rectal Once  . carvedilol  4.6875 mg Oral BID WC  . ceFEPime (MAXIPIME) IV  2 g Intravenous Q M,W,F-HD  . cinacalcet  60 mg Oral Q breakfast  . darbepoetin (ARANESP) injection - DIALYSIS  150 mcg Intravenous Q Thu-HD  . docusate sodium  100 mg Oral BID  . feeding supplement (NEPRO CARB STEADY)  237 mL Oral BID WC  . feeding  supplement  1 Container Oral BID BM  . heparin  5,000 Units Subcutaneous Q8H  . lactulose  10 g Oral BID  . [START ON 11/05/2012] levofloxacin (LEVAQUIN) IV  500 mg Intravenous Q48H  . methadone  5 mg Oral QID  . multivitamin  1 tablet Oral Daily  . pantoprazole  40 mg Oral Daily  . polyethylene glycol  17 g Oral Daily  . ranolazine  500 mg Oral Daily  . [START ON 11/05/2012] sevelamer  400 mg Oral TID WC  . sevelamer carbonate  400 mg Oral TID WC  . simvastatin  10 mg Oral q1800  . sodium chloride  3 mL Intravenous Q12H  . sodium chloride  3 mL Intravenous Q12H  . vancomycin  500 mg Intravenous Q M,W,F-HD    Physical Exam:  Blood pressure 105/55, pulse 68, temperature 97.9 F (36.6  C), temperature source Oral, resp. rate 19, height 5\' 4"  (1.626 m), weight 50.5 kg (111 lb 5.3 oz), SpO2 100.00%.  General: Cachectic, tearful, in obvious discomfort.  Head: Normocephalic, atraumatic, sclera non-icteric, mucus membranes are moist  Neck: Supple. JVD not elevated.  Lungs: Mostly clear. No without wheezes, rales, or rhonchi. Breathing is unlabored.  Heart: RRR S1/S2. AICD left chest. No murmurs, rubs, or gallops appreciated.  Abdomen: Distended. Diffusely tender, esp in LUQ and suprapubic region, +guarding. Normoactive bowel sounds. No obvious abdominal masses.  M-S: Strength and tone somewhat diminished.  Lower extremities: 2+ LE pitting edema. No ischemic changes or open wounds  Neuro: Alert and oriented X 3. Moves all extremities spontaneously.  Psych: Responds to questions appropriately with a normal affect.  Dialysis Access: Rt I-J   Dialysis Orders: Center: [facility] on [facility].  EDW 45.5 kg HD Bath 3K/2.25Ca Time 4:00 Heparin 1400 u. Access R I-J BFR 400 DFR A1.5  Hectorol 0 mcg IV/HD Epogen 20000 Units IV/HD Venofer 0   Assessment/Plan:  1. Abdominal Pain/Nausea/Vomiting - +ascites on CT in patient with known cirrhosis, chronic pancreatitis and hx of pancreatic bed infections.  "Septated"  fluid collections on [place] per IR yest, they were not able to do a large volume paracentesis.  On pain meds and empiric IV abx.  2. ESRD - [facility] HD. K+4.8. Plan for HD tomorrow, just 3 hours 3. Hypertension/volume - SBPs 90's. Unable to remove any fluid at HD last night, BP dropped into 50's.  5 kg up but CXR is clear, first time in over 6 mos. She probably has a new higher dry weight due to lean body weight gain and ascites.  Says she has been eating better since a family member moved in with her 4. Anemia - Hgb 10.1 on op Epo 20000u. Will start Aranesp 150. No op IV Fe or recent iron studies. 5. Metabolic bone disease - Ca 9.2 (11.2 corrected) Low Ca bath, continue Renvela. Last PTH 216.7 on 5/9. No Vit D. 6. Nutrition - Severely malnourished. On clears for now. Liberalized diet with fluid restriction ok when appropriate. Multivitamin, breeze, nepro. 7. Limited code / partial DNR    Vinson Moselle  MD [phone] pgr    [phone] cell 11/04/2012, 10:38 AM

## 2012-11-04 NOTE — Progress Notes (Addendum)
Palliative Medicine Team SW Present for PMT discussion, see NP note. Extensive emotional support provided to pt, and later caregiver [name],  as pt considers her increasing difficulty tolerating H/D and the option of pursuing comfort measures. Pt struggling with fact that her "body is making the choice" for her. Pt expressed her desire to spend her "last days at home". PMT awaiting input from pt's family about these difficult choices as there has been resistance to a comfort approach in the past; also need confirmation that they can manage her care at home with help from Hospice services. PMT to solidify goals once family returns contact attempts.   4:56 PM Received call to PMT phone from dtr Tekeshia at [phone]. Reviewed earlier PMT  discussion with dtr who was very tearful. Provided emotional support to dtr and encouraged her to "take one thing at a time". Assured dtr that nothing in pt's care plan has changed other than her code status. Dtr expressed understanding, reports plan to arrive in [place] as soon as she can. Phone call was disconnected before we could set up a time for family to confirm plans with PMT provider; made several unsuccessful attempts reconnect with dtr, phone goes directly to voicemail. Will await callback from family; provider to see over weekend as needed.    Kennieth Francois, LCSWA PMT Phone [phone]

## 2012-11-04 NOTE — Progress Notes (Signed)
Pts BP continuously dropping during hemodialysis Tx. Dr. Lowell Guitar paged and before he called back her BP was dropping in the 50's. Tx was stopped and blood was rinsed back to pt.remained low and she was given a 100cc NS bolus with some improvement but still hypotensive. Pt remains arousble when BP is low. Dr. Lowell Guitar aware of hypotension and states it is fine to move pt back to unit 6700 since she is asymptomatic.

## 2012-11-04 NOTE — Progress Notes (Signed)
[facility]  Patient Status: Active (receiving services up to time of hospitalization)  [facility] is providing the following services: RN, PT and HHA  If patient discharges after hours, please call [phone].   [name] 11/04/2012, 10:35 AM

## 2012-11-04 NOTE — Progress Notes (Signed)
NUTRITION FOLLOW UP  DOCUMENTATION CODES  Per approved criteria   -Severe malnutrition in the context of chronic illness    Intervention:   Lexicographer. Add Nepro Shake per patient preferences. Will liberalize diet to Regular to promote better intake - per Claud Kelp PA note. Recommend nutrition support if within goals of care given ongoing malnutrition.  RD to continue to follow nutrition care plan.  Nutrition Dx:   Inadequate oral intake related to poor appetite as evidenced by poor oral intake and ongoing weight loss. Ongoing.  Goal:   Intake to meet >90% of estimated nutrition needs. Unmet.  Monitor:   weight trends, lab trends, I/O's, PO intake, supplement tolerance, GOC  Assessment:   Pt well-known to this RD.   Admitted with worsening abdominal pain, n/v. CT abdomen and pelvis shows massive ascites and possible infiltrates in the lung. Also admitted with low blood sugars 2/2 poor oral intake and n/v.   Palliative care consulted to [facility]. Per chart, pt with GOC meeting scheduled at noon today - no note available at this time.  Pt meets criteria for severe MALNUTRITION in the context of chronic illness as evidenced by severe muscle and fat mass loss.  Height: Ht Readings from Last 1 Encounters:  11/03/12 5\' 4"  (1.626 m)    Weight Status:   Wt Readings from Last 1 Encounters:  11/04/12 111 lb 5.3 oz (50.5 kg)    Re-estimated needs:  Kcal: 1600 - 1750 Protein: 65 - 75 g Fluid: 1.2 liters  Skin: stage II sacrum  Diet Order: Renal 80-90; 1.2 liters   Intake/Output Summary (Last 24 hours) at 11/04/12 1354 Last data filed at 11/04/12 0209  Gross per 24 hour  Intake    120 ml  Output    428 ml  Net   -308 ml    Last BM: PTA   Labs:   Recent Labs Lab 11/02/12 2249 11/02/12 2313 11/03/12 1200 11/03/12 2355  NA 138 139 137 135  K 4.3 4.4 4.8 4.6  CL 101 105 102 100  CO2 25  --  20 25  BUN 33* 30* 35* 42*  CREATININE 2.42* 2.60*  2.68* 2.87*  CALCIUM 9.1  --  9.2 9.4  PHOS  --   --   --  4.5  GLUCOSE 50* 46* 108* 77    CBG (last 3)   Recent Labs  11/04/12 0557 11/04/12 0806 11/04/12 1224  GLUCAP 162* 141* 96    Scheduled Meds: . amiodarone  200 mg Oral BID  . aspirin EC  81 mg Oral Daily  . bisacodyl  10 mg Rectal Once  . carvedilol  4.6875 mg Oral BID WC  . ceFEPime (MAXIPIME) IV  2 g Intravenous Q M,W,F-HD  . cinacalcet  60 mg Oral Q breakfast  . darbepoetin (ARANESP) injection - DIALYSIS  150 mcg Intravenous Q Thu-HD  . docusate sodium  100 mg Oral BID  . feeding supplement (NEPRO CARB STEADY)  237 mL Oral BID WC  . feeding supplement  1 Container Oral BID BM  . heparin  5,000 Units Subcutaneous Q8H  . lactulose  10 g Oral BID  . methadone  5 mg Oral QID  . multivitamin  1 tablet Oral Daily  . pantoprazole  40 mg Oral Daily  . polyethylene glycol  17 g Oral Daily  . ranolazine  500 mg Oral Daily  . [START ON 11/05/2012] sevelamer  400 mg Oral TID WC  . sevelamer carbonate  400 mg Oral TID WC  . simvastatin  10 mg Oral q1800  . sodium chloride  3 mL Intravenous Q12H  . sodium chloride  3 mL Intravenous Q12H  . vancomycin  500 mg Intravenous Q M,W,F-HD    Continuous Infusions: . dextrose 50 mL/hr (11/04/12 9604)   Dorothy Motto MS, RD, LDN Pager: [phone] After-hours pager: [phone]

## 2012-11-05 DIAGNOSIS — J189 Pneumonia, unspecified organism: Secondary | ICD-10-CM

## 2012-11-05 LAB — GLUCOSE, CAPILLARY
Glucose-Capillary: 81 mg/dL (ref 70–99)
Glucose-Capillary: 80 mg/dL (ref 70–99)
Glucose-Capillary: 83 mg/dL (ref 70–99)
Glucose-Capillary: 120 mg/dL — ABNORMAL HIGH (ref 70–99)
Glucose-Capillary: 92 mg/dL (ref 70–99)
Glucose-Capillary: 96 mg/dL (ref 70–99)

## 2012-11-05 NOTE — Progress Notes (Signed)
PATIENT DETAILS Name: [name] Age: 62 y.o. Sex: female Date of Birth: July 21, 1950 Admit Date: 11/02/2012 Admitting Physician Eduard Clos, MD [id number], ROSS B, MD  Subjective: Still very weak. Does not HD anymore-wants to go home-Daughter in [place] and Son in [place].  Assessment/Plan: Principal Problem: Abdominal pain with nausea vomiting -initial suspicion was for SBP-however no free asitic fluid present to do paracentesis, abdominal pain could be 2/2 constipation or ?flare of her chronic pancreatitis -will attempt to treat constipation and see if she continues to improve -c/w narcotics for comfort/pain control  Hypoglycemia -secondary to poor oral intake secondary to nausea vomiting, and also from underlying liver cirrhosis -prn Dextrose-still persists intermittently -CBG's every 2 hours-till CBG's stable  HCAP -afebrile -WBC down slightly as 5/23-labs not done today -stopped Levaquin 5/23-,will stop Vanco 5/24 and just c/w Cefepime, if patient truly wants to transition to full comfort care then will stop Cefepime as well-for now continue  Severe Cardiomyopathy-Ischemic  -AICD in place-may need to turn this off-when she decides to fully transition to comfort care -EF 20-25% echo March 2014, 10% by cath (05/06/12) -Diuresis with HD -on low dose Coreg-BP permitting-not on ACEI due to hypotension  ESRD  -complicated by Hypotension -does not want further HD-HD cancelled for today  Hx of CAD -stable at present -last LHC on 4/29 showed progressive CAD-but no culprit lesion for NSTEMI on April 2014 -on ASA, Coreg, Zocor-will stop these once she transitions to full comfort care  Liver Cirrhosis -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 Palliative care meeting results noted -5/24-long talk with patient this am-she does not want further HD, she is still making up her mind regarding turning off AICD and stopping some of her medications. Spoke with [name] and [name] over the phone today-long discussion, family struggling with end of life issues. [place] planning to come to [facility] tomorrow-patient interested in going home with hospice   Disposition: Remain inpatient  DVT Prophylaxis: Prophylactic Heparin   Code Status: DNR  Family Communication As above  Procedures:  None  CONSULTS:  nephrology   MEDICATIONS: Scheduled Meds: . amiodarone  200 mg Oral BID  . aspirin EC  81 mg Oral Daily  . carvedilol  4.6875 mg Oral BID WC  . ceFEPime (MAXIPIME) IV  2 g Intravenous Q M,W,F-HD  . docusate sodium  100 mg Oral BID  . feeding supplement (NEPRO CARB STEADY)  237 mL Oral BID WC  . heparin  5,000 Units Subcutaneous Q8H  . lactulose  10 g Oral BID  . methadone  5 mg Oral QID  . pantoprazole  40 mg Oral Daily  . polyethylene glycol  17 g Oral Daily  . ranolazine  500 mg Oral Daily  . simvastatin  10 mg Oral q1800  . sodium chloride  3 mL Intravenous Q12H  . sodium chloride  3 mL Intravenous Q12H  . vancomycin  500 mg Intravenous Q M,W,F-HD   Continuous Infusions: . dextrose 50 mL/hr (11/04/12 0614)   PRN Meds:.acetaminophen, acetaminophen, bisacodyl, dextrose, HYDROmorphone (DILAUDID) injection, nitroGLYCERIN, ondansetron (ZOFRAN) IV, ondansetron  Antibiotics: Anti-infectives  Start     Dose/Rate Route Frequency Ordered Stop   11/05/12 0900  levofloxacin (LEVAQUIN) IVPB 500 mg  Status:  Discontinued     500 mg 100 mL/hr over 60 Minutes  Intravenous Every 48 hours 11/03/12 0809 11/04/12 1326   11/04/12 1200  ceFEPIme (MAXIPIME) 2 g in dextrose 5 % 50 mL IVPB     2 g 100 mL/hr over 30 Minutes Intravenous Every M-W-F (Hemodialysis) 11/03/12 1409     11/04/12 1200  vancomycin (VANCOCIN) 500 mg in sodium chloride 0.9 % 100 mL IVPB     500 mg 100 mL/hr over 60 Minutes Intravenous Every M-W-F (Hemodialysis) 11/03/12 1409     11/03/12 1100  vancomycin (VANCOCIN) IVPB 1000 mg/200 mL premix  Status:  Discontinued     1,000 mg 200 mL/hr over 60 Minutes Intravenous  Once 11/03/12 0807 11/03/12 1403   11/03/12 0930  ceFEPIme (MAXIPIME) 2 g in dextrose 5 % 50 mL IVPB     2 g 100 mL/hr over 30 Minutes Intravenous  Once 11/03/12 0807 11/03/12 1319   11/03/12 0815  levofloxacin (LEVAQUIN) IVPB 750 mg     750 mg 100 mL/hr over 90 Minutes Intravenous  Once 11/03/12 0806 11/03/12 1140   11/03/12 0300  vancomycin (VANCOCIN) IVPB 1000 mg/200 mL premix     1,000 mg 200 mL/hr over 60 Minutes Intravenous  Once 11/03/12 0258 11/03/12 0602   11/03/12 0300  piperacillin-tazobactam (ZOSYN) IVPB 3.375 g     3.375 g 12.5 mL/hr over 240 Minutes Intravenous  Once 11/03/12 0258 11/03/12 0412       PHYSICAL EXAM: Vital signs in last 24 hours: Filed Vitals:   11/04/12 1837 11/04/12 2003 11/05/12 1007 11/05/12 1405  BP: 99/59 96/63 95/66  99/60  Pulse: 67 68 67 66  Temp: 98 F (36.7 C) 97.7 F (36.5 C) 96.7 F (35.9 C) 97.6 F (36.4 C)  TempSrc: Oral Oral Oral Oral  Resp: 18 18 18 18   Height:  5\' 4"  (1.626 m)    Weight:  50.5 kg (111 lb 5.3 oz)    SpO2: 100% 100% 97% 100%    Weight change: 0.377 kg (13.3 oz) Filed Weights   11/03/12 2300 11/04/12 0209 11/04/12 2003  Weight: 50.8 kg (111 lb 15.9 oz) 50.5 kg (111 lb 5.3 oz) 50.5 kg (111 lb 5.3 oz)   Body mass index is 19.1 kg/(m^2).   Gen Exam: Awake and alert with clear speech. Cachectic Neck: Supple, No JVD.   Chest: B/L Clear.   CVS: S1 S2 Regular, no murmurs.  Abdomen: soft, BS  +, still diffusely tender with no rebound, non distended.  Extremities: no edema, lower extremities warm to touch. Neurologic: Non Focal.   Skin: No Rash.   Wounds: N/A.   Intake/Output from previous day:  Intake/Output Summary (Last 24 hours) at 11/05/12 1634 Last data filed at 11/05/12 1500  Gross per 24 hour  Intake    460 ml  Output      0 ml  Net    460 ml     LAB RESULTS: CBC  Recent Labs Lab 11/02/12 2249 11/02/12 2313 11/03/12 1200 11/03/12 2355  WBC 15.0*  --  12.9* 13.5*  HGB 11.7* 12.9 10.1* 9.6*  HCT 37.1 38.0 32.3* 30.7*  PLT 163  --  124* 125*  MCV 89.8  --  89.5 90.3  MCH 28.3  --  28.0 28.2  MCHC 31.5  --  31.3 31.3  RDW 17.9*  --  17.8*  18.0*  LYMPHSABS 0.9  --   --  0.7  MONOABS 0.6  --   --  0.7  EOSABS 0.0  --   --  0.0  BASOSABS 0.0  --   --  0.0    Chemistries   Recent Labs Lab 11/02/12 2249 11/02/12 2313 11/03/12 1200 11/03/12 2355  NA 138 139 137 135  K 4.3 4.4 4.8 4.6  CL 101 105 102 100  CO2 25  --  20 25  GLUCOSE 50* 46* 108* 77  BUN 33* 30* 35* 42*  CREATININE 2.42* 2.60* 2.68* 2.87*  CALCIUM 9.1  --  9.2 9.4    CBG:  Recent Labs Lab 11/05/12 0610 11/05/12 0745 11/05/12 0807 11/05/12 1004 11/05/12 1205  GLUCAP 81 66* 80 83 120*    GFR Estimated Creatinine Clearance: 16 ml/min (by C-G formula based on Cr of 2.87).  Coagulation profile No results found for this basename: INR, PROTIME,  in the last 168 hours  Cardiac Enzymes No results found for this basename: CK, CKMB, TROPONINI, MYOGLOBIN,  in the last 168 hours  No components found with this basename: POCBNP,  No results found for this basename: DDIMER,  in the last 72 hours No results found for this basename: HGBA1C,  in the last 72 hours No results found for this basename: CHOL, HDL, LDLCALC, TRIG, CHOLHDL, LDLDIRECT,  in the last 72 hours No results found for this basename: TSH, T4TOTAL, FREET3, T3FREE, THYROIDAB,  in the last 72 hours  Recent Labs   11/03/12 2355  TIBC Not calculated due to Iron <10.  IRON <10*    Recent Labs  11/02/12 2249 11/04/12 0630  LIPASE 7* 5*    Urine Studies No results found for this basename: UACOL, UAPR, USPG, UPH, UTP, UGL, UKET, UBIL, UHGB, UNIT, UROB, ULEU, UEPI, UWBC, URBC, UBAC, CAST, CRYS, UCOM, BILUA,  in the last 72 hours  MICROBIOLOGY: Recent Results (from the past 240 hour(s))  CULTURE, BLOOD (ROUTINE X 2)     Status: None   Collection Time    11/03/12 12:00 PM      Result Value Range Status   Specimen Description BLOOD RIGHT HAND   Final   Special Requests BOTTLES DRAWN AEROBIC AND ANAEROBIC 5CC   Final   Culture  Setup Time 11/03/2012 16:24   Final   Culture     Final   Value:        BLOOD CULTURE RECEIVED NO GROWTH TO DATE CULTURE WILL BE HELD FOR 5 DAYS BEFORE ISSUING A FINAL NEGATIVE REPORT   Report Status PENDING   Incomplete  CULTURE, BLOOD (ROUTINE X 2)     Status: None   Collection Time    11/03/12  2:00 PM      Result Value Range Status   Specimen Description BLOOD RIGHT ARM   Final   Special Requests BOTTLES DRAWN AEROBIC AND ANAEROBIC 10CC   Final   Culture  Setup Time 11/03/2012 21:34   Final   Culture     Final   Value:        BLOOD CULTURE RECEIVED NO GROWTH TO DATE CULTURE WILL BE HELD FOR 5 DAYS BEFORE ISSUING A FINAL NEGATIVE REPORT   Report Status PENDING   Incomplete    RADIOLOGY STUDIES/RESULTS: Ct Abdomen Pelvis Wo Contrast  11/03/2012   *RADIOLOGY REPORT*  Clinical Data: Abdominal pain.  End-stage renal disease.  CT ABDOMEN AND PELVIS WITHOUT CONTRAST  Technique:  Multidetector CT imaging of the abdomen  and pelvis was performed following the standard protocol without intravenous contrast.  Comparison: CT scan 10/15/2012.  Findings: Stable cirrhotic changes involving the liver with massive abdominal/pelvic ascites, unchanged since recent study.  No obvious small bowel obstruction.  Severe atherosclerotic calcifications involving the abdominal vasculature.  The  kidneys demonstrate chronic changes of renal failure with cystic changes, scarring and calcification.  No hydronephrosis.  Diffuse body wall edema is again demonstrated.  Persistent right pleural effusion and right lower lobe infiltrate.  IMPRESSION:  1.  Persistent right pleural effusion and right lower lobe infiltrate. 2.  Cirrhosis and massive abdominal/pelvic ascites. 3.  Severe atherosclerotic calcifications. 4.  Chronic renal disease but no hydronephrosis.   Original Report Authenticated By: Rudie Meyer, M.D.   Ct Abdomen Pelvis W Contrast  10/15/2012   *RADIOLOGY REPORT*  Clinical Data: Lower abdominal pain with nausea and vomiting. Constipation.  Diabetic on dialysis over 1 year.  Prior appendectomy, hysterectomy and cholecystectomy.  CT ABDOMEN AND PELVIS WITH CONTRAST  Technique:  Multidetector CT imaging of the abdomen and pelvis was performed following the standard protocol during bolus administration of intravenous contrast.  Contrast: 80mL OMNIPAQUE IOHEXOL 300 MG/ML  SOLN  Comparison: 08/19/2012.  Findings: AICD is in place.  Marked cardiomegaly.  Diffuse ascites complex with a loculated appearance. In the pelvis, loculated ascites is displacing sigmoid colon posteriorly and small bowel away from the pelvis.  Areas of loculated free air surround the spleen.  This may represent residua of infection previously noted.  Upper left lateral abdominal wall abscess remains measuring 2.5 x 4.4 x 5.5 cm with a loculated septated appearance.  Overall dimensions without significant change.  Abnormal appearance left lobe liver.  This may represent alterations in vascular supply to the liver rather than primary mass.  Small kidneys with calcifications and cysts.  Atherosclerotic type changes aorta with ectasia.  Atherosclerotic type changes iliac arteries with ectasia and narrowing.  Prominent branch vessel atherosclerotic type changes.  Right pleural effusion.  Right base infiltrate/atelectasis. Minimal  atelectatic changes left base.  No obvious pancreatic abnormality.  Hyperplasia adrenal glands.  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  IMPRESSION: Complex ascites.  In the pelvis this appears loculated and is compressing the sigmoid colon, bladder and displacing small bowel from the pelvis.  Areas of loculated free air surround the spleen.  This may represent residua of infection previously noted.  Upper left lateral wall abscess is septated and has overall dimensions relatively similar to the prior exam.  Marked cardiomegaly.  Right pleural effusion.  Right base infiltrate/atelectasis. Minimal atelectatic changes left base.  Significant atherosclerotic type changes.  Renal osteodystrophy changes of the spine.   Original Report Authenticated By: Lacy Duverney, M.D.   US Abdomen Limited  11/04/2012   LIMITED ULTRASOUND ABDOMEN  Date of procedure 11/03/2012    Clinical data:  Patient with history of end-stage renal disease, congestive heart failure and ascites. Request is made for paracentesis.  Findings:  Limited ultrasound of the abdomen in all four quadrants today reveals only minimal ascites.   There is a small amount of perihepatic and multiloculated ascites noted.  Findings discussed with primary service and decision made to cancel paracentesis amount.  Impression:  Limited ultrasound abdomen all four quadrants reveals minimal ascites, primary perihepatic and multiloculated. Following discussion with ordering physician decision made to cancel paracentesis for now.  Read by: Jeananne Rama, P.A.-C   Original Report Authenticated By: D. Andria Rhein, MD   US Paracentesis  10/17/2012   *  RADIOLOGY REPORT*  Clinical Data: Recurrent abdominal ascites  ULTRASOUND GUIDED PARACENTESIS  Comparison:  Previous paracentesis  An ultrasound guided paracentesis was thoroughly discussed with the patient and questions answered.  The benefits, risks, alternatives and complications were also discussed.  The patient  understands and wishes to proceed with the procedure.  Written consent was obtained.  Ultrasound was performed to localize and mark an adequate pocket of fluid in the left lower quadrant of the abdomen.  The area was then prepped and draped in the normal sterile fashion.  1% Lidocaine was used for local anesthesia.  Under ultrasound guidance a 19 gauge Yueh catheter was introduced.  Paracentesis was performed.  The catheter was removed and a dressing applied.  Complications:  None  Findings:  A total of approximately 1160 ml of cloudy bloody ascitic fluid was removed.  A fluid sample was sent for laboratory analysis.  IMPRESSION: Successful ultrasound guided paracentesis yielding 1160 ml of ascites.  Read by Brayton El PA-C   Original Report Authenticated By: Irish Lack, M.D.   Dg Chest Port 1 View  11/02/2012   *RADIOLOGY REPORT*  Clinical Data: Swelling in feet and chest pain.  PORTABLE CHEST - 1 VIEW  Comparison: 10/16/2012.  Findings: The heart is enlarged but stable.  The pacer wires / AICD are unchanged.  The right IJ diatek catheter is stable.  The lungs are clear.  No edema or effusions.  IMPRESSION:  1.  Cardiac enlargement, stable. 2.  Stable support apparatus. 3.  No acute pulmonary findings.   Original Report Authenticated By: Rudie Meyer, M.D.   Dg Chest Port 1 View  10/16/2012   *RADIOLOGY REPORT*  Clinical Data: Pulmonary edema post hemodialysis.  PORTABLE CHEST - 1 VIEW  Comparison: 10/15/2012.  Findings: Right central line tip right atrium level.  Skin folds on the right suspected without pneumothorax.  Attention to this possibility follow-up.  AICD / pacemaker enters from the left.  Leads unchanged in position.  Post CABG.  Cardiomegaly.  Pulmonary edema.  Basilar infiltrate or right not excluded.  IMPRESSION: Cardiomegaly of pulmonary edema.  Right base infiltrate not excluded.  Prominent skin folds on the right suspected.  Please see above.   Original Report Authenticated By: Lacy Duverney, M.D.   Dg Chest Port 1 View  10/15/2012   *RADIOLOGY REPORT*  Clinical Data: Shortness of breath.  History of dialysis and CHF.  PORTABLE CHEST - 1 VIEW  Comparison: 10/08/2012  Findings: Right jugular dialysis catheter tip is in the right atrium.  There is a left cardiac ICD.  Heart remains enlarged for size.  Prominent interstitial densities suggest mild edema or vascular congestion.  Limited evaluation of the right costophrenic angle and cannot exclude a small right pleural effusion.  IMPRESSION: Cardiomegaly with mild interstitial edema or vascular congestion.   Original Report Authenticated By: Richarda Overlie, M.D.   Dg Chest Port 1 View  10/08/2012   *RADIOLOGY REPORT*  Clinical Data: Shortness of breath  PORTABLE CHEST - 1 VIEW  Comparison: 08/19/2012  Findings: Pacing pad are noted over the chest, obscuring detail. Left-sided AICD in place.  Right IJ approach hemodialysis catheter tips over the right atrium.  Moderate enlargement of the cardiomediastinal silhouette again noted with central vascular congestion and hazy perihilar airspace opacity.  Trace right pleural effusion.  Findings are not significantly changed. Evidence of CABG again noted.  IMPRESSION: Stable findings of cardiomegaly with hazy pulmonary opacities and right pleural effusion that could indicate pulmonary edema.  Original Report Authenticated By: Christiana Pellant, M.D.   Dg Abd Portable 2v  11/03/2012   *RADIOLOGY REPORT*  Clinical Data: Abdominal pain, constipation  PORTABLE ABDOMEN - 2 VIEW  Comparison: CT abdomen pelvis dated 11/03/2012  Findings: Nonobstructive bowel gas pattern.  Moderate colonic stool burden.  Cholecystectomy clips.  Vascular calcifications.  Cardiomegaly.  Postsurgical changes related to prior CABG.  ICD leads, incompletely visualized.  IMPRESSION: Moderate colonic stool burden.   Original Report Authenticated By: Charline Bills, M.D.    Jeoffrey Massed, MD  [facility] Pager:[phone]  If 7PM-7AM, please contact night-coverage [email or web address] Password [facility] 11/05/2012, 4:34 PM   LOS: 3 days

## 2012-11-05 NOTE — Consult Note (Signed)
I have reviewed and discussed the care of this patient in detail with the nurse practitioner including pertinent patient records, physical exam findings and data. I agree with details of this encounter.  

## 2012-11-05 NOTE — Progress Notes (Signed)
Hypoglycemic Event  CBG: 66  Treatment:  Breakfast , juice  Symptoms:  None, alert and oriented x 3, skin warm and dry  Follow-up CBG: Time:0800 CBG Result:80  Possible Reasons for Event:  Poor intake  Comments/MD notified:Ghimre    [name] B  Remember to initiate Hypoglycemia Order Set & complete

## 2012-11-05 NOTE — Care Management Note (Signed)
CARE MANAGEMENT NOTE 11/05/2012  Patient:  [name], [name]   Account Number:  [id number]  Date Initiated:  11/05/2012  Documentation initiated by:  Vance Peper  Subjective/Objective Assessment:   62 yr old female, ESRD, admitted with SOB. Patient is terminal and has requested home with hospice care today if at all possible.     Action/Plan:   CM spoke with patient concerning discharge plans. Call placed to [facility].   Anticipated DC Date:  11/05/2012   Anticipated DC Plan:  HOME W HOSPICE CARE      DC Planning Services  CM consult      PAC Choice  HOSPICE   Choice offered to / List presented to:  C-1 Patient        HH arranged  HH-1 RN  HH-4 NURSE'S AIDE      HH agency  [facility]   Status of service:  In process, will continue to follow Medicare Important Message given?   (If response is "NO", the following Medicare IM given date fields will be blank) Date Medicare IM given:   Date Additional Medicare IM given:    Discharge Disposition:    Per UR Regulation:    If discussed at Long Length of Stay Meetings, dates discussed:    Comments:

## 2012-11-05 NOTE — Progress Notes (Addendum)
Call received to PMT phone from [name]'s daughter [place] requesting phone meeting. I returned call and left message. Will try to meet with family tomorrow to prepare them for next steps. Family struggling with her decision to stop HD, but also expressing that they want to support [name] and her decisions. Will need to make sure that she has adequate caregivers in the home prior to discharge. HD has been discontinued therefore her prognosis is days. Will continue to provide comfort measures and honor patient's wishes to not continue HD.  Anderson Malta, DO Palliative Medicine

## 2012-11-05 NOTE — Progress Notes (Signed)
[facility] Progress Note  Subjective:   Wants to go home today. She believes family can take care of her at home. Daughter flying in from [place] - not sure when. Does not wish to do HD today (Friday tmt   Objective Filed Vitals:   11/04/12 0922 11/04/12 1837 11/04/12 2003 11/05/12 1007  BP: 105/55 99/59 96/63  95/66  Pulse: 68 67 68 67  Temp: 97.9 F (36.6 C) 98 F (36.7 C) 97.7 F (36.5 C) 96.7 F (35.9 C)  TempSrc: Oral Oral Oral Oral  Resp: 19 18 18 18   Height:   5\' 4"  (1.626 m)   Weight:   50.5 kg (111 lb 5.3 oz)   SpO2: 100% 100% 100% 97%   Physical Exam General: Tired, ill,  Heart: RRR Lungs: no wheezes or rales Abdomen: distended, tender Extremities: ++ LE edema Neuro:  Speech clear, but slower than usual; not as focused Dialysis Access: Rt I-J   Dialysis Orders: Center: [facility] on [facility].  EDW 45.5 kg HD Bath 3K/2.25Ca Time 4:00 Heparin 1400 u. Access R I-J BFR 400 DFR A1.5  Hectorol 0 mcg IV/HD Epogen 20000 Units IV/HD Venofer 0   Assessment/Plan:   1. ESRD - [facility] HD.  - ready to stop dialysis - dialysis and  labs cancelled for today 2. Abdominal Pain/Nausea/Vomiting - +ascites on CT in patient with known cirrhosis, chronic pancreatitis and hx of pancreatic bed infections. "Septated" fluid collections on [place], unable to do largevolume paracentesis.  BC x 2 pending no growth 3. Hypertension/volume - SBPs 90's. Unable to remove any fluid at last HD tmt that ended at 2 am Friday, BP dropped into 50's. 5 kg up but CXR is clear. She probably has higher dry weight due to lean body weight gain and ascites 4. Anemia - d/c aranesp; . Fe studies very low but since stopping dialysis no need to treat 5. Metabolic bone disease -have discontinued sensipar and binders 6. Nutrition - Severely malnourished. Diet as tolerated; d/c multivitmain 7. Full DNR -  8. EOL issues - she is ready to go home with Hospice - cancel labs today  Sheffield Slider, PA-C [facility] Beeper [phone] 11/05/2012,10:09 AM  LOS: 3 days   Patient seen and examined.  I agree with plan as above with additions as indicated. Vinson Moselle  MD [phone] pgr    [phone] cell 11/05/2012, 12:42 PM    Additional Objective Labs: Basic Metabolic Panel:  Recent Labs Lab 11/02/12 2249 11/02/12 2313 11/03/12 1200 11/03/12 2355  NA 138 139 137 135  K 4.3 4.4 4.8 4.6  CL 101 105 102 100  CO2 25  --  20 25  GLUCOSE 50* 46* 108* 77  BUN 33* 30* 35* 42*  CREATININE 2.42* 2.60* 2.68* 2.87*  CALCIUM 9.1  --  9.2 9.4  PHOS  --   --   --  4.5   Liver Function Tests:  Recent Labs Lab 11/02/12 2249 11/03/12 1200 11/03/12 2355  AST 24 21  --   ALT 21 20  --   ALKPHOS 188* 170*  --   BILITOT 0.4 0.3  --   PROT 6.4 5.5*  --   ALBUMIN 1.7* 1.4* 1.4*    Recent Labs Lab 11/02/12 2249 11/04/12 0630  LIPASE 7* 5*   CBC:  Recent Labs Lab 11/02/12 2249 11/02/12 2313 11/03/12 1200 11/03/12 2355  WBC 15.0*  --  12.9* 13.5*  NEUTROABS 13.5*  --   --  12.1*  HGB 11.7* 12.9 10.1* 9.6*  HCT 37.1 38.0 32.3* 30.7*  MCV 89.8  --  89.5 90.3  PLT 163  --  124* 125*   Blood Culture    Component Value Date/Time   SDES BLOOD RIGHT ARM 11/03/2012 1400   SPECREQUEST BOTTLES DRAWN AEROBIC AND ANAEROBIC 10CC 11/03/2012 1400   CULT        BLOOD CULTURE RECEIVED NO GROWTH TO DATE CULTURE WILL BE HELD FOR 5 DAYS BEFORE ISSUING A FINAL NEGATIVE REPORT 11/03/2012 1400   REPTSTATUS PENDING 11/03/2012 1400   CBG:  Recent Labs Lab 11/04/12 2344 11/05/12 0516 11/05/12 0610 11/05/12 0745 11/05/12 0807  GLUCAP 77 55* 81 66* 80   Iron Studies:  Recent Labs  11/03/12 2355  IRON <10*  TIBC Not calculated due to Iron <10.   Medications: . dextrose 50 mL/hr (11/04/12 4098)   . amiodarone  200 mg Oral BID  . aspirin EC  81 mg Oral Daily  . carvedilol  4.6875 mg Oral BID WC  . ceFEPime (MAXIPIME) IV  2 g Intravenous Q M,W,F-HD  . cinacalcet  60 mg Oral Q breakfast   . darbepoetin (ARANESP) injection - DIALYSIS  150 mcg Intravenous Q Thu-HD  . docusate sodium  100 mg Oral BID  . feeding supplement (NEPRO CARB STEADY)  237 mL Oral BID WC  . heparin  5,000 Units Subcutaneous Q8H  . lactulose  10 g Oral BID  . methadone  5 mg Oral QID  . multivitamin  1 tablet Oral Daily  . pantoprazole  40 mg Oral Daily  . polyethylene glycol  17 g Oral Daily  . ranolazine  500 mg Oral Daily  . sevelamer  400 mg Oral TID WC  . simvastatin  10 mg Oral q1800  . sodium chloride  3 mL Intravenous Q12H  . sodium chloride  3 mL Intravenous Q12H  . vancomycin  500 mg Intravenous Q M,W,F-HD

## 2012-11-05 NOTE — Care Management Note (Signed)
CARE MANAGEMENT NOTE 11/05/2012  Comments:  11/05/12  1600   Vance Peper, RN BSN NCM CM spoke with Hospice weekend RN.Faxed information to hospice referral. MD will review and speak with patient.CM contacted Dr. Jerral Ralph with this information as well. As bedside RN to inform patient she will not discharge today.

## 2012-11-06 LAB — GLUCOSE, CAPILLARY
Glucose-Capillary: 39 mg/dL — CL (ref 70–99)
Glucose-Capillary: 39 mg/dL — CL (ref 70–99)
Glucose-Capillary: 66 mg/dL — ABNORMAL LOW (ref 70–99)
Glucose-Capillary: 66 mg/dL — ABNORMAL LOW (ref 70–99)
Glucose-Capillary: 72 mg/dL (ref 70–99)
Glucose-Capillary: 77 mg/dL (ref 70–99)
Glucose-Capillary: 82 mg/dL (ref 70–99)
Glucose-Capillary: 89 mg/dL (ref 70–99)

## 2012-11-06 MED ORDER — GLUCAGON HCL (RDNA) 1 MG IJ SOLR
INTRAMUSCULAR | Status: AC
  Administered 2012-11-06: 1 mg via INTRAMUSCULAR
  Filled 2012-11-06: qty 1

## 2012-11-06 MED ORDER — GLUCAGON HCL (RDNA) 1 MG IJ SOLR: 0.5000 mg | Freq: Once | INTRAMUSCULAR | Status: AC | PRN

## 2012-11-06 MED ORDER — DEXTROSE 50 % IV SOLN
25.0000 mL | Freq: Once | INTRAVENOUS | Status: AC
  Administered 2012-11-06: 25 mL via INTRAVENOUS
  Filled 2012-11-06: qty 50

## 2012-11-06 MED ORDER — DEXTROSE 5 % IV SOLN
500.0000 mg | INTRAVENOUS | Status: DC
  Filled 2012-11-06: qty 0.5

## 2012-11-06 MED ORDER — DEXTROSE 50 % IV SOLN: 25.0000 mL | Freq: Once | INTRAVENOUS | Status: DC | PRN

## 2012-11-06 MED ORDER — GLUCAGON HCL (RDNA) 1 MG IJ SOLR: 1.0000 mg | Freq: Once | INTRAMUSCULAR | Status: AC | PRN

## 2012-11-06 NOTE — Progress Notes (Signed)
Transportation arranged via [facility].

## 2012-11-06 NOTE — Progress Notes (Signed)
Hypoglycemic Event  CBG: 66  Treatment: D50 IV 25 mL  Symptoms: None  Follow-up CBG: Time:0115 CBG Result:102  Possible Reasons for Event: Inadequate meal intake  Comments/MD notified:Larene Pickett, Arrie Eastern  Remember to initiate Hypoglycemia Order Set & complete

## 2012-11-06 NOTE — Progress Notes (Signed)
Called by Dr. Jerral Ralph to discontinue AICD functions. She has a Surveyor, mining - I have contacted the representative who will turn off the shock features today. If she is having active ventricular arrhythmias, a magnet can be placed over the device to deactivate temporarily.  Chrystie Nose, MD, [facility] Attending Cardiologist [facility]

## 2012-11-06 NOTE — Progress Notes (Addendum)
Hypoglycemic Event   CBG: 66  Treatment: Snack  Symptoms: None  Follow-up CBG: Time:0100 CBG Result:66  Possible Reasons for Event: Inadequate meal intake  Comments/MD notified:Larene Pickett, Arrie Eastern  Remember to initiate Hypoglycemia Order Set & complete

## 2012-11-06 NOTE — Progress Notes (Signed)
ANTIBIOTIC CONSULT NOTE - FOLLOW UP  Pharmacy Consult for cefepime Indication: pneumonia  Allergies  Allergen Reactions  . Codeine Itching  . Morphine And Related Shortness Of Breath    SOB when given IV once "to me it was mild; I called out fast for help; never had to intubate" (05/23/2012)  . Ace Inhibitors Other (See Comments)    Unknown reaction but her physician stated that she can't take it (specifically lisinopril)  . Tylenol (Acetaminophen) Other (See Comments)    Patient states that the doctor told her she has a Fatty Liver.    Patient Measurements: Height: 5\' 4"  (162.6 cm) Weight: 111 lb 5.3 oz (50.5 kg) IBW/kg (Calculated) : 54.7  Vital Signs: Temp: 97.3 F (36.3 C) (05/25 0355) Temp src: Oral (05/25 0355) BP: 83/62 mmHg (05/25 0355) Pulse Rate: 63 (05/25 0355) Intake/Output from previous day: 05/24 0701 - 05/25 0700 In: 3018.3 [P.O.:360; I.V.:2608.3; IV Piggyback:50] Out: -  Intake/Output from this shift:    Labs:  Recent Labs  11/03/12 1200 11/03/12 2355  WBC 12.9* 13.5*  HGB 10.1* 9.6*  PLT 124* 125*  CREATININE 2.68* 2.87*   Estimated Creatinine Clearance: 16 ml/min (by C-G formula based on Cr of 2.87). No results found for this basename: VANCOTROUGH, Leodis Binet, VANCORANDOM, GENTTROUGH, GENTPEAK, GENTRANDOM, TOBRATROUGH, TOBRAPEAK, TOBRARND, AMIKACINPEAK, AMIKACINTROU, AMIKACIN,  in the last 72 hours   Microbiology: Recent Results (from the past 720 hour(s))  MRSA PCR SCREENING     Status: None   Collection Time    10/09/12  1:28 AM      Result Value Range Status   MRSA by PCR NEGATIVE  NEGATIVE Final   Comment:            The GeneXpert MRSA Assay (FDA     approved for NASAL specimens     only), is one component of a     comprehensive MRSA colonization     surveillance program. It is not     intended to diagnose MRSA     infection nor to guide or     monitor treatment for     MRSA infections.  BODY FLUID CULTURE     Status: None   Collection Time    10/17/12  3:27 PM      Result Value Range Status   Specimen Description FLUID ASCITIC ABDOMEN   Final   Special Requests FLUID   Final   Gram Stain     Final   Value: RARE WBC PRESENT, PREDOMINANTLY MONONUCLEAR     NO ORGANISMS SEEN   Culture NO GROWTH 3 DAYS   Final   Report Status 10/21/2012 FINAL   Final  CULTURE, BLOOD (ROUTINE X 2)     Status: None   Collection Time    11/03/12 12:00 PM      Result Value Range Status   Specimen Description BLOOD RIGHT HAND   Final   Special Requests BOTTLES DRAWN AEROBIC AND ANAEROBIC 5CC   Final   Culture  Setup Time 11/03/2012 16:24   Final   Culture     Final   Value:        BLOOD CULTURE RECEIVED NO GROWTH TO DATE CULTURE WILL BE HELD FOR 5 DAYS BEFORE ISSUING A FINAL NEGATIVE REPORT   Report Status PENDING   Incomplete  CULTURE, BLOOD (ROUTINE X 2)     Status: None   Collection Time    11/03/12  2:00 PM      Result Value Range  Status   Specimen Description BLOOD RIGHT ARM   Final   Special Requests BOTTLES DRAWN AEROBIC AND ANAEROBIC 10CC   Final   Culture  Setup Time 11/03/2012 21:34   Final   Culture     Final   Value:        BLOOD CULTURE RECEIVED NO GROWTH TO DATE CULTURE WILL BE HELD FOR 5 DAYS BEFORE ISSUING A FINAL NEGATIVE REPORT   Report Status PENDING   Incomplete    Anti-infectives   Start     Dose/Rate Route Frequency Ordered Stop   11/06/12 1000  ceFEPIme (MAXIPIME) 500 mg in dextrose 5 % 50 mL IVPB     500 mg 100 mL/hr over 30 Minutes Intravenous Every 24 hours 11/06/12 0810     11/05/12 0900  levofloxacin (LEVAQUIN) IVPB 500 mg  Status:  Discontinued     500 mg 100 mL/hr over 60 Minutes Intravenous Every 48 hours 11/03/12 0809 11/04/12 1326   11/04/12 1200  ceFEPIme (MAXIPIME) 2 g in dextrose 5 % 50 mL IVPB  Status:  Discontinued     2 g 100 mL/hr over 30 Minutes Intravenous Every M-W-F (Hemodialysis) 11/03/12 1409 11/06/12 0810   11/04/12 1200  vancomycin (VANCOCIN) 500 mg in sodium chloride  0.9 % 100 mL IVPB  Status:  Discontinued     500 mg 100 mL/hr over 60 Minutes Intravenous Every M-W-F (Hemodialysis) 11/03/12 1409 11/05/12 1708   11/03/12 1100  vancomycin (VANCOCIN) IVPB 1000 mg/200 mL premix  Status:  Discontinued     1,000 mg 200 mL/hr over 60 Minutes Intravenous  Once 11/03/12 0807 11/03/12 1403   11/03/12 0930  ceFEPIme (MAXIPIME) 2 g in dextrose 5 % 50 mL IVPB     2 g 100 mL/hr over 30 Minutes Intravenous  Once 11/03/12 0807 11/03/12 1319   11/03/12 0815  levofloxacin (LEVAQUIN) IVPB 750 mg     750 mg 100 mL/hr over 90 Minutes Intravenous  Once 11/03/12 0806 11/03/12 1140   11/03/12 0300  vancomycin (VANCOCIN) IVPB 1000 mg/200 mL premix     1,000 mg 200 mL/hr over 60 Minutes Intravenous  Once 11/03/12 0258 11/03/12 0602   11/03/12 0300  piperacillin-tazobactam (ZOSYN) IVPB 3.375 g     3.375 g 12.5 mL/hr over 240 Minutes Intravenous  Once 11/03/12 0258 11/03/12 0454      Assessment: Pt has made the decision to stop HD and wishes to go home to hospice. Cefepime is continuing for now.    Goal of Therapy:  eradication of infection  Plan:  Change cefepime dose to non-HD dosing: Cefepime 500 mg IV q24h Follow clinical course and hospice plans   Doris Cheadle, PharmD Clinical Pharmacist Pager: [phone] Phone: [phone] 11/06/2012 8:14 AM

## 2012-11-06 NOTE — Progress Notes (Addendum)
Confirmed with Quanesha today her decision to stop dialysis. She is ready. Her daughter is taking a bus from [place] and will arrive later today. She will be having her pacemaker deactivated and wants this done before her daughter comes.  Renal will sign off and be available for further conversations with the patient or family if needed.  Bard Herbert, PA-C [facility]  Patient seen and examined.  Agree with assessment and plan as above. Vinson Moselle  MD [phone] pgr    [phone] cell 11/06/2012, 11:24 AM

## 2012-11-06 NOTE — Progress Notes (Signed)
Patient's daughter-in-law approached this RN, stating that patient "just wants to go home." Per CM note on 11/05/12, info. faxed out to hospice. Per Dr.Golding, pt is ready for d/c home if everything can be arranged. Marcelino Duster, CM today attempting to contact hospice to see if this can be arranged.

## 2012-11-06 NOTE — Progress Notes (Signed)
PATIENT DETAILS Name: [name] Age: 62 y.o. Sex: female Date of Birth: November 14, 1950 Admit Date: 11/02/2012 Admitting Physician Eduard Clos, MD OZH:[id number], ROSS B, MD  Subjective: Weaker, still getting hypoglycemic. We talked about deactivating her AICD-she is ready to get it done, does not want to wait till her daughter or other family members arrive  Assessment/Plan: Principal Problem: Abdominal pain with nausea vomiting -initial suspicion was for SBP-however no free asitic fluid present to do paracentesis, abdominal pain could be 2/2 constipation or ?flare of her chronic pancreatitis -will attempt to treat constipation and see if she continues to improve -c/w narcotics for comfort/pain control  Hypoglycemia -secondary to poor oral intake secondary to nausea vomiting, and also from underlying liver cirrhosis -prn Dextrose-still persists intermittently -CBG's every 2 hours-till CBG's stable  HCAP -afebrile -WBC down slightly as 5/23-labs not done today -stopped Levaquin 5/23-,will stop Vanco 5/24 and just c/w Cefepime, looks like patient is going to be full comfort care status-await arrival of daughter-but will d/c Cefepime today 5/25  Severe Cardiomyopathy-Ischemic  -AICD in place-she wants to go ahead and turn AICD feature off-have spoken with Dr Rennis Golden -EF 20-25% echo March 2014, 10% by cath (05/06/12) -Diuresis was with HD-but she does not want HD anymore -on low dose Coreg-BP permitting-not on ACEI due to hypotension-probably can stop this as well.  ESRD  -complicated by Hypotension -does not want further HD-no further plans for HD at this time. She continues to not want further HD today as well.  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 Palliative care meeting results noted -5/24-long talk with patient this am-she does not want further HD, she is still making up her mind regarding turning off AICD and stopping some of her medications. Spoke with [name] and [name] over the phone today-long discussion, family struggling with end of life issues. [name] planning to come to [facility] tomorrow-patient interested in going home with hospice  5/25-spoke with patient at length today-she has had some time to think about turning AICD off, She wants to go ahead and do it. She still does not want to pursue HD anymore.  Disposition: Remain inpatient-suspect she will decline in a matter of a few days  DVT Prophylaxis: Prophylactic Heparin- will stop once she is full comfort care status  Code Status: DNR  Family Communication As above  Procedures:  None  CONSULTS:  nephrology   MEDICATIONS: Scheduled Meds: . amiodarone  200 mg Oral BID  . aspirin EC  81 mg Oral Daily  . carvedilol  4.6875 mg Oral BID WC  . ceFEPime (MAXIPIME) IV  500 mg Intravenous Q24H  . docusate sodium  100 mg Oral BID  . feeding supplement (NEPRO CARB STEADY)  237 mL Oral BID WC  . heparin  5,000 Units Subcutaneous Q8H  . lactulose  10 g Oral BID  . methadone  5 mg Oral QID  .  pantoprazole  40 mg Oral Daily  . polyethylene glycol  17 g Oral Daily  . ranolazine  500 mg Oral Daily  . simvastatin  10 mg Oral q1800  . sodium chloride  3 mL Intravenous Q12H  . sodium chloride   3 mL Intravenous Q12H   Continuous Infusions: . dextrose 50 mL/hr at 11/06/12 0328   PRN Meds:.acetaminophen, acetaminophen, bisacodyl, dextrose, HYDROmorphone (DILAUDID) injection, nitroGLYCERIN, ondansetron (ZOFRAN) IV, ondansetron  Antibiotics: Anti-infectives   Start     Dose/Rate Route Frequency Ordered Stop   11/06/12 1000  ceFEPIme (MAXIPIME) 500 mg in dextrose 5 % 50 mL IVPB     500 mg 100 mL/hr over 30 Minutes Intravenous Every 24 hours 11/06/12 0810     11/05/12 0900  levofloxacin (LEVAQUIN) IVPB 500 mg  Status:  Discontinued     500 mg 100 mL/hr over 60 Minutes Intravenous Every 48 hours 11/03/12 0809 11/04/12 1326   11/04/12 1200  ceFEPIme (MAXIPIME) 2 g in dextrose 5 % 50 mL IVPB  Status:  Discontinued     2 g 100 mL/hr over 30 Minutes Intravenous Every M-W-F (Hemodialysis) 11/03/12 1409 11/06/12 0810   11/04/12 1200  vancomycin (VANCOCIN) 500 mg in sodium chloride 0.9 % 100 mL IVPB  Status:  Discontinued     500 mg 100 mL/hr over 60 Minutes Intravenous Every M-W-F (Hemodialysis) 11/03/12 1409 11/05/12 1708   11/03/12 1100  vancomycin (VANCOCIN) IVPB 1000 mg/200 mL premix  Status:  Discontinued     1,000 mg 200 mL/hr over 60 Minutes Intravenous  Once 11/03/12 0807 11/03/12 1403   11/03/12 0930  ceFEPIme (MAXIPIME) 2 g in dextrose 5 % 50 mL IVPB     2 g 100 mL/hr over 30 Minutes Intravenous  Once 11/03/12 0807 11/03/12 1319   11/03/12 0815  levofloxacin (LEVAQUIN) IVPB 750 mg     750 mg 100 mL/hr over 90 Minutes Intravenous  Once 11/03/12 0806 11/03/12 1140   11/03/12 0300  vancomycin (VANCOCIN) IVPB 1000 mg/200 mL premix     1,000 mg 200 mL/hr over 60 Minutes Intravenous  Once 11/03/12 0258 11/03/12 0602   11/03/12 0300  piperacillin-tazobactam (ZOSYN) IVPB 3.375 g     3.375 g 12.5 mL/hr over 240 Minutes Intravenous  Once 11/03/12 0258 11/03/12 0412       PHYSICAL EXAM: Vital signs in last 24 hours: Filed Vitals:   11/05/12 1818 11/05/12 2020 11/06/12 0355  11/06/12 0903  BP: 93/64 95/55 83/62  94/65  Pulse: 1 67 63 59  Temp: 97.5 F (36.4 C) 97.4 F (36.3 C) 97.3 F (36.3 C)   TempSrc: Oral Oral Oral Oral  Resp: 19 19 19 18   Height:      Weight:      SpO2: 95% 99% 100% 100%    Weight change:  Filed Weights   11/03/12 2300 11/04/12 0209 11/04/12 2003  Weight: 50.8 kg (111 lb 15.9 oz) 50.5 kg (111 lb 5.3 oz) 50.5 kg (111 lb 5.3 oz)   Body mass index is 19.1 kg/(m^2).   Gen Exam: Awake and alert with clear speech. Cachectic-weak today compared to yesterday Neck: Supple, No JVD.   Chest: B/L Clear.   CVS: S1 S2 Regular, no murmurs.  Abdomen: soft, BS +, still diffusely tender with no rebound, non distended.  Extremities: no edema, lower extremities warm to touch. Neurologic: Non Focal.   Skin: No Rash.   Wounds: N/A.   Intake/Output from previous day:  Intake/Output Summary (Last 24 hours) at 11/06/12  1056 Last data filed at 11/06/12 0904  Gross per 24 hour  Intake 3018.33 ml  Output      0 ml  Net 3018.33 ml     LAB RESULTS: CBC  Recent Labs Lab 11/02/12 2249 11/02/12 2313 11/03/12 1200 11/03/12 2355  WBC 15.0*  --  12.9* 13.5*  HGB 11.7* 12.9 10.1* 9.6*  HCT 37.1 38.0 32.3* 30.7*  PLT 163  --  124* 125*  MCV 89.8  --  89.5 90.3  MCH 28.3  --  28.0 28.2  MCHC 31.5  --  31.3 31.3  RDW 17.9*  --  17.8* 18.0*  LYMPHSABS 0.9  --   --  0.7  MONOABS 0.6  --   --  0.7  EOSABS 0.0  --   --  0.0  BASOSABS 0.0  --   --  0.0    Chemistries   Recent Labs Lab 11/02/12 2249 11/02/12 2313 11/03/12 1200 11/03/12 2355  NA 138 139 137 135  K 4.3 4.4 4.8 4.6  CL 101 105 102 100  CO2 25  --  20 25  GLUCOSE 50* 46* 108* 77  BUN 33* 30* 35* 42*  CREATININE 2.42* 2.60* 2.68* 2.87*  CALCIUM 9.1  --  9.2 9.4    CBG:  Recent Labs Lab 11/05/12 1814 11/05/12 2022 11/05/12 2246 11/06/12 0004 11/06/12 0129  GLUCAP 96 77 66* 66* 102*    GFR Estimated Creatinine Clearance: 16 ml/min (by C-G formula based on  Cr of 2.87).  Coagulation profile No results found for this basename: INR, PROTIME,  in the last 168 hours  Cardiac Enzymes No results found for this basename: CK, CKMB, TROPONINI, MYOGLOBIN,  in the last 168 hours  No components found with this basename: POCBNP,  No results found for this basename: DDIMER,  in the last 72 hours No results found for this basename: HGBA1C,  in the last 72 hours No results found for this basename: CHOL, HDL, LDLCALC, TRIG, CHOLHDL, LDLDIRECT,  in the last 72 hours No results found for this basename: TSH, T4TOTAL, FREET3, T3FREE, THYROIDAB,  in the last 72 hours  Recent Labs  11/03/12 2355  TIBC Not calculated due to Iron <10.  IRON <10*    Recent Labs  11/04/12 0630  LIPASE 5*    Urine Studies No results found for this basename: UACOL, UAPR, USPG, UPH, UTP, UGL, UKET, UBIL, UHGB, UNIT, UROB, ULEU, UEPI, UWBC, URBC, UBAC, CAST, CRYS, UCOM, BILUA,  in the last 72 hours  MICROBIOLOGY: Recent Results (from the past 240 hour(s))  CULTURE, BLOOD (ROUTINE X 2)     Status: None   Collection Time    11/03/12 12:00 PM      Result Value Range Status   Specimen Description BLOOD RIGHT HAND   Final   Special Requests BOTTLES DRAWN AEROBIC AND ANAEROBIC 5CC   Final   Culture  Setup Time 11/03/2012 16:24   Final   Culture     Final   Value:        BLOOD CULTURE RECEIVED NO GROWTH TO DATE CULTURE WILL BE HELD FOR 5 DAYS BEFORE ISSUING A FINAL NEGATIVE REPORT   Report Status PENDING   Incomplete  CULTURE, BLOOD (ROUTINE X 2)     Status: None   Collection Time    11/03/12  2:00 PM      Result Value Range Status   Specimen Description BLOOD RIGHT ARM   Final   Special Requests BOTTLES DRAWN AEROBIC  AND ANAEROBIC 10CC   Final   Culture  Setup Time 11/03/2012 21:34   Final   Culture     Final   Value:        BLOOD CULTURE RECEIVED NO GROWTH TO DATE CULTURE WILL BE HELD FOR 5 DAYS BEFORE ISSUING A FINAL NEGATIVE REPORT   Report Status PENDING   Incomplete     RADIOLOGY STUDIES/RESULTS: Ct Abdomen Pelvis Wo Contrast  11/03/2012   *RADIOLOGY REPORT*  Clinical Data: Abdominal pain.  End-stage renal disease.  CT ABDOMEN AND PELVIS WITHOUT CONTRAST  Technique:  Multidetector CT imaging of the abdomen and pelvis was performed following the standard protocol without intravenous contrast.  Comparison: CT scan 10/15/2012.  Findings: Stable cirrhotic changes involving the liver with massive abdominal/pelvic ascites, unchanged since recent study.  No obvious small bowel obstruction.  Severe atherosclerotic calcifications involving the abdominal vasculature.  The kidneys demonstrate chronic changes of renal failure with cystic changes, scarring and calcification.  No hydronephrosis.  Diffuse body wall edema is again demonstrated.  Persistent right pleural effusion and right lower lobe infiltrate.  IMPRESSION:  1.  Persistent right pleural effusion and right lower lobe infiltrate. 2.  Cirrhosis and massive abdominal/pelvic ascites. 3.  Severe atherosclerotic calcifications. 4.  Chronic renal disease but no hydronephrosis.   Original Report Authenticated By: Rudie Meyer, M.D.   Ct Abdomen Pelvis W Contrast  10/15/2012   *RADIOLOGY REPORT*  Clinical Data: Lower abdominal pain with nausea and vomiting. Constipation.  Diabetic on dialysis over 1 year.  Prior appendectomy, hysterectomy and cholecystectomy.  CT ABDOMEN AND PELVIS WITH CONTRAST  Technique:  Multidetector CT imaging of the abdomen and pelvis was performed following the standard protocol during bolus administration of intravenous contrast.  Contrast: 80mL OMNIPAQUE IOHEXOL 300 MG/ML  SOLN  Comparison: 08/19/2012.  Findings: AICD is in place.  Marked cardiomegaly.  Diffuse ascites complex with a loculated appearance. In the pelvis, loculated ascites is displacing sigmoid colon posteriorly and small bowel away from the pelvis.  Areas of loculated free air surround the spleen.  This may represent residua of infection  previously noted.  Upper left lateral abdominal wall abscess remains measuring 2.5 x 4.4 x 5.5 cm with a loculated septated appearance.  Overall dimensions without significant change.  Abnormal appearance left lobe liver.  This may represent alterations in vascular supply to the liver rather than primary mass.  Small kidneys with calcifications and cysts.  Atherosclerotic type changes aorta with ectasia.  Atherosclerotic type changes iliac arteries with ectasia and narrowing.  Prominent branch vessel atherosclerotic type changes.  Right pleural effusion.  Right base infiltrate/atelectasis. Minimal atelectatic changes left base.  No obvious pancreatic abnormality.  Hyperplasia adrenal glands.  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  IMPRESSION: Complex ascites.  In the pelvis this appears loculated and is compressing the sigmoid colon, bladder and displacing small bowel from the pelvis.  Areas of loculated free air surround the spleen.  This may represent residua of infection previously noted.  Upper left lateral wall abscess is septated and has overall dimensions relatively similar to the prior exam.  Marked cardiomegaly.  Right pleural effusion.  Right base infiltrate/atelectasis. Minimal atelectatic changes left base.  Significant atherosclerotic type changes.  Renal osteodystrophy changes of the spine.   Original Report Authenticated By: Lacy Duverney, M.D.   US Abdomen Limited  11/04/2012   LIMITED ULTRASOUND ABDOMEN  Date of procedure 11/03/2012    Clinical data:  Patient with history of end-stage renal disease, congestive heart  failure and ascites. Request is made for paracentesis.  Findings:  Limited ultrasound of the abdomen in all four quadrants today reveals only minimal ascites.   There is a small amount of perihepatic and multiloculated ascites noted.  Findings discussed with primary service and decision made to cancel paracentesis amount.  Impression:  Limited ultrasound abdomen all four  quadrants reveals minimal ascites, primary perihepatic and multiloculated. Following discussion with ordering physician decision made to cancel paracentesis for now.  Read by: Jeananne Rama, P.A.-C   Original Report Authenticated By: D. Andria Rhein, MD   US Paracentesis  10/17/2012   *RADIOLOGY REPORT*  Clinical Data: Recurrent abdominal ascites  ULTRASOUND GUIDED PARACENTESIS  Comparison:  Previous paracentesis  An ultrasound guided paracentesis was thoroughly discussed with the patient and questions answered.  The benefits, risks, alternatives and complications were also discussed.  The patient understands and wishes to proceed with the procedure.  Written consent was obtained.  Ultrasound was performed to localize and mark an adequate pocket of fluid in the left lower quadrant of the abdomen.  The area was then prepped and draped in the normal sterile fashion.  1% Lidocaine was used for local anesthesia.  Under ultrasound guidance a 19 gauge Yueh catheter was introduced.  Paracentesis was performed.  The catheter was removed and a dressing applied.  Complications:  None  Findings:  A total of approximately 1160 ml of cloudy bloody ascitic fluid was removed.  A fluid sample was sent for laboratory analysis.  IMPRESSION: Successful ultrasound guided paracentesis yielding 1160 ml of ascites.  Read by Brayton El PA-C   Original Report Authenticated By: Irish Lack, M.D.   Dg Chest Port 1 View  11/02/2012   *RADIOLOGY REPORT*  Clinical Data: Swelling in feet and chest pain.  PORTABLE CHEST - 1 VIEW  Comparison: 10/16/2012.  Findings: The heart is enlarged but stable.  The pacer wires / AICD are unchanged.  The right IJ diatek catheter is stable.  The lungs are clear.  No edema or effusions.  IMPRESSION:  1.  Cardiac enlargement, stable. 2.  Stable support apparatus. 3.  No acute pulmonary findings.   Original Report Authenticated By: Rudie Meyer, M.D.   Dg Chest Port 1 View  10/16/2012   *RADIOLOGY  REPORT*  Clinical Data: Pulmonary edema post hemodialysis.  PORTABLE CHEST - 1 VIEW  Comparison: 10/15/2012.  Findings: Right central line tip right atrium level.  Skin folds on the right suspected without pneumothorax.  Attention to this possibility follow-up.  AICD / pacemaker enters from the left.  Leads unchanged in position.  Post CABG.  Cardiomegaly.  Pulmonary edema.  Basilar infiltrate or right not excluded.  IMPRESSION: Cardiomegaly of pulmonary edema.  Right base infiltrate not excluded.  Prominent skin folds on the right suspected.  Please see above.   Original Report Authenticated By: Lacy Duverney, M.D.   Dg Chest Port 1 View  10/15/2012   *RADIOLOGY REPORT*  Clinical Data: Shortness of breath.  History of dialysis and CHF.  PORTABLE CHEST - 1 VIEW  Comparison: 10/08/2012  Findings: Right jugular dialysis catheter tip is in the right atrium.  There is a left cardiac ICD.  Heart remains enlarged for size.  Prominent interstitial densities suggest mild edema or vascular congestion.  Limited evaluation of the right costophrenic angle and cannot exclude a small right pleural effusion.  IMPRESSION: Cardiomegaly with mild interstitial edema or vascular congestion.   Original Report Authenticated By: Richarda Overlie, M.D.   Dg Chest [facility]  1 View  10/08/2012   *RADIOLOGY REPORT*  Clinical Data: Shortness of breath  PORTABLE CHEST - 1 VIEW  Comparison: 08/19/2012  Findings: Pacing pad are noted over the chest, obscuring detail. Left-sided AICD in place.  Right IJ approach hemodialysis catheter tips over the right atrium.  Moderate enlargement of the cardiomediastinal silhouette again noted with central vascular congestion and hazy perihilar airspace opacity.  Trace right pleural effusion.  Findings are not significantly changed. Evidence of CABG again noted.  IMPRESSION: Stable findings of cardiomegaly with hazy pulmonary opacities and right pleural effusion that could indicate pulmonary edema.   Original Report  Authenticated By: Christiana Pellant, M.D.   Dg Abd Portable 2v  11/03/2012   *RADIOLOGY REPORT*  Clinical Data: Abdominal pain, constipation  PORTABLE ABDOMEN - 2 VIEW  Comparison: CT abdomen pelvis dated 11/03/2012  Findings: Nonobstructive bowel gas pattern.  Moderate colonic stool burden.  Cholecystectomy clips.  Vascular calcifications.  Cardiomegaly.  Postsurgical changes related to prior CABG.  ICD leads, incompletely visualized.  IMPRESSION: Moderate colonic stool burden.   Original Report Authenticated By: Charline Bills, M.D.    Jeoffrey Massed, MD  [facility] Pager:[phone]  If 7PM-7AM, please contact night-coverage [email or web address] Password [email or web address] 11/06/2012, 10:56 AM   LOS: 4 days

## 2012-11-07 DIAGNOSIS — R109 Unspecified abdominal pain: Principal | ICD-10-CM

## 2012-11-07 DIAGNOSIS — I2589 Other forms of chronic ischemic heart disease: Secondary | ICD-10-CM

## 2012-11-07 DIAGNOSIS — R0602 Shortness of breath: Secondary | ICD-10-CM

## 2012-11-07 LAB — GLUCOSE, CAPILLARY: Glucose-Capillary: 67 mg/dL — ABNORMAL LOW (ref 70–99)

## 2012-11-07 MED ORDER — HYDROMORPHONE HCL PF 1 MG/ML IJ SOLN
INTRAMUSCULAR | Status: AC
  Administered 2012-11-07: 1 mg
  Filled 2012-11-07: qty 1

## 2012-11-07 MED ORDER — LIDOCAINE-PRILOCAINE 2.5-2.5 % EX CREA: TOPICAL_CREAM | Freq: Two times a day (BID) | CUTANEOUS | Status: AC | PRN

## 2012-11-07 MED ORDER — GLUCAGON HCL (RDNA) 1 MG IJ SOLR
INTRAMUSCULAR | Status: AC
  Administered 2012-11-07: 1 mg
  Filled 2012-11-07: qty 1

## 2012-11-07 MED ORDER — HYDROMORPHONE HCL PF 1 MG/ML IJ SOLN
1.0000 mg | INTRAMUSCULAR | Status: DC | PRN
  Administered 2012-11-07: 1 mg via INTRAVENOUS

## 2012-11-07 MED ORDER — LIDOCAINE-PRILOCAINE 2.5-2.5 % EX CREA
TOPICAL_CREAM | Freq: Two times a day (BID) | CUTANEOUS | Status: DC | PRN
  Filled 2012-11-07 (×2): qty 5

## 2012-11-07 MED ORDER — GLUCAGON HCL (RDNA) 1 MG IJ SOLR: 0.5000 mg | Freq: Once | INTRAMUSCULAR | Status: AC | PRN

## 2012-11-07 MED ORDER — HYDROMORPHONE HCL PF 1 MG/ML IJ SOLN
1.0000 mg | INTRAMUSCULAR | Status: DC | PRN
  Administered 2012-11-07 (×2): 1 mg via INTRAMUSCULAR
  Filled 2012-11-07 (×2): qty 1

## 2012-11-07 MED ORDER — HYDROMORPHONE HCL PF 1 MG/ML IJ SOLN: 1.0000 mg | INTRAMUSCULAR | Status: DC | PRN

## 2012-11-07 MED ORDER — GLUCAGON HCL (RDNA) 1 MG IJ SOLR
1.0000 mg | Freq: Once | INTRAMUSCULAR | Status: AC | PRN
  Administered 2012-11-07: 1 mg via INTRAMUSCULAR

## 2012-11-07 MED ORDER — GLUCAGON HCL (RDNA) 1 MG IJ SOLR
INTRAMUSCULAR | Status: AC
  Filled 2012-11-07: qty 1

## 2012-11-07 MED ORDER — OXYCODONE HCL 5 MG/5ML PO SOLN: 2.0000 mg | ORAL | Status: DC | PRN

## 2012-11-07 MED ORDER — HYDROMORPHONE HCL PF 1 MG/ML IJ SOLN
1.0000 mg | INTRAMUSCULAR | Status: DC | PRN
  Administered 2012-11-07 (×3): 1 mg via INTRAVENOUS
  Filled 2012-11-07: qty 2
  Filled 2012-11-07: qty 1

## 2012-11-07 MED ORDER — SODIUM CHLORIDE 0.9 % IV SOLN: 1.0000 mg/h | INTRAVENOUS | Status: AC

## 2012-11-07 NOTE — Progress Notes (Signed)
Patient [id number] [name]      DOB: Nov 17, 1950      [id number]:[id number]  Received call from nursing regarding q two hour blood surgars.  Reviewed chart and goals of care for this patient.  At this time,  Patient refusing blood glucose check and correction with oral glucose gel,and glucagon. Will request to stop checking/correcting glucose levels which is compatible with goals to pursue full hospice care and comfort. Will contact my team to check patient ASAP this am as she may not be able to tolerate transition to home under these circumstances.   Ashtyn Freilich L. Ladona Ridgel, MD MBA The Palliative Medicine Team at [facility] Phone: [phone] Pager: [phone]

## 2012-11-07 NOTE — Progress Notes (Signed)
Patient [name]      DOB: [birth year]      WUJ:[id number]   Palliative Medicine Team at [facility] Progress Note    Subjective:Patient lying in bed with eyes open, responses muted, difficult to understand, not taking any oral food/fluid. Daughter [name] and patients sister at bedside. Daughter notably upset about decision to stop dialysis, discussed with her patients choice was based on her physical intolerance, and impact on her quality of life   Filed Vitals:   11/07/12 0414  BP: 96/57  Pulse: 60  Temp: 97.4 F (36.3 C)  Resp: 18   Physical exam: General: lethargic CHEST: CTA, respirations shallow [id number] ABD: pain upon light palpation NEURO: lethargic   Assessment and plan: Patient with ESRD, also pancreatic pseudocyst, end-stage CHF (EF 10%). Decision made by patient to stop HD, requesting to be discharged home with hospice support.  1) Code Status: DNR/DNI (AICD has been de-activated) 2)Pain: currently receiving Dilaudid 1-2 mg every hour as needed, has not taken Methadone orally for past 48 hours. PMT recommending transition home on continuous Dilaudid 1 mg/hr CADD pump to be initiated and regulated by hospice services. 3) Spiritual Care: called chaplin to come to bedside to assist with daughters/sisters grief 4) Disposition: disposition home with full hospice support  Time In Time Out Total Time Spent with Patient Total Overall Time  12:45p 1:00p 15 min 15 min   Freddie Breech, CNS-C Palliative Medicine Team [facility] Team Phone: [phone] Pager: [phone]

## 2012-11-07 NOTE — Progress Notes (Signed)
Chaplain responded to request from physician to support pt's daughter who has just arrived from [place] and is having trouble accepting pt's wishes to stop dialysis and all other medical interventions. Counselor intern accompanied me as she has had previous visits with pt. Pt's daughter and pt's sister recall that on two previous occasions pt rallied from being near death. They are tearfully accepting pt's decision to forego further treatment. Physician spoke to them and informed them that pt is much weaker this time, has multi-organ failure, and cannot recover. This solidified their acceptance of pt's wishes. Chaplain provided ministry of presence, empathic listening, and emotional and spiritual support to pt's daughter and sister. They plan to take pt home today with hospice care and hopefully have family and friends come to celebrate pt's life while she is still alive. Chaplain offered words of comfort to pt. Pt very weak but able to respond. Chaplain had prayer with pt.

## 2012-11-07 NOTE — Progress Notes (Signed)
Upon speaking w/nurse, I learned pt was on comfort care and afraid to be alone. 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 Pt was thankful for visit. Helped pt get help when she was uncomfortable.  Marjory Lies Chaplain  11/07/12 1200  Clinical Encounter Type  Visited With Patient

## 2012-11-07 NOTE — Discharge Summary (Addendum)
Physician Discharge Summary  [name]:[id number] DOB: [birth year] DOA: 11/02/2012  PCP: Jyl Heinz, MD  Admit date: 11/02/2012 Discharge date: 11/07/2012  Time spent: 50 minutes  Recommendations for Outpatient Follow-up:  Home with Hospice in the care of her daughter.  Prognosis is hours to days. Patient will be on IV Dilaudid infusion  Discharge Diagnoses:  Principal Problem:   Abdominal pain Active Problems:   Chronic systolic congestive heart failure, NYHA class 3   ESRD on hemodialysis   CAD, CABG X 4 Feb '08, LAD stent June '08- cath 4/14 - med Rx   Ascites   Healthcare-associated pneumonia   Discharge Condition:  Prognosis is poor.  Life expectancy is hours to days.  Diet recommendation: comfort feedings.  Filed Weights   11/04/12 0209 11/04/12 2003 11/06/12 2027  Weight: 50.5 kg (111 lb 5.3 oz) 50.5 kg (111 lb 5.3 oz) 51.619 kg (113 lb 12.8 oz)    History of present illness at the time of admission:  [name] is a 62 y.o. female with known history of ischemic cardiomyopathy, CAD status post CABG and stenting, ESRD on hemodialysis Monday Wednesday and Friday, and end stage liver disease.  She was brought to the ER patient has been having abdominal pain. Patient states that the pain has been present for the last 24 hours and is mostly in the lower quadrants and has been having nausea and vomiting. Denies any chest pain shortness of breath productive cough fever chills. CT abdomen and pelvis shows large ascites and there possible infiltrates in the lung for which patient has been started on antibiotics for health care associated pneumonia. In addition patient has been found to be hypoglycemic. For which D50 has been given and CBG has increased to 136. Patient will be admitted for further management. Patient last month was admitted for non-ST elevation MI and cardiac catheter was done and medical management was planned. Patient also recently was admitted for CHF  decompensation.  Hospital Course:   The morning after admission the patient continued to have low CBGs despite treatment.  She had little to no PO intake.  Dr. Jerral Ralph had an in-depth conversation with [name] regarding her chronic multi-system organ failure.  [name], a retired [employer] fundraiser, understood that she was nearing the end of her life and elected to become DNR status.  Dialysis was progressively becoming less effective because [name] could not tolerate the treatment due to low blood pressure.  [name] decided to discontinue hemo-dialysis treatment.   Renal was on-board and supported her decision.  Her defibrillator was deactivated at her own request.  Palliative medicine was consulted for goals of care.  At this point the patient understands that her life span is limited.  She wants to be discharged to her home in the care of her daughter with Hospice support.  Her daughter is traveling to [place] and [name] will be discharged in her care when she arrives.  Abdominal pain with nausea vomiting  Upon admission the initial suspicion was for SBP, however there was no free asitic fluid present to do paracentesis.  Abdominal pain was felt to be secondary to constipation or flare of chronic pancreatitis.  She received stool softeners and enemas with some pain relief.  Narcotics have been continued for pain control.  While she is eating very little she has had no more vomiting.  Hypoglycemia  Secondary to poor oral intake secondary to nausea vomiting, and  underlying liver cirrhosis.  Patient  received multiple doses of PRN D50 and was on D10 IVF.   She continued to have very low blood sugar (30s - 60s), but refused oral glucose, glucagon and further CBG checks.   HCAP  [name] was initially treated with Vanc / Levaquin/ cefepime.  Her WBC trended down and she was afebrile.  Her antibiotics were decreased and finally discontinued as consensus was reached that she would become full comfort  care.  Severe Cardiomyopathy-Ischemic  EF 20-25% echo March 2014, 10% by cath (05/06/12).  AICD was disconnected. Diuresis was with HD-but she does not want HD anymore.  Now on Hospice care.  ESRD  Complicated by Hypotension.  Does not want further HD  Hx of CAD  Stable at present. Last LHC on 4/29 showed progressive CAD-but no culprit lesion for NSTEMI on April 2014.  Discontinued Coreg and aspirin.  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  Had varices s/p banding x2 done Nov 04, 2011.  Paracentesis attempted 5/22-no free fluid-but septated collection present.   Chronic Pancreatitis Comfort care.  Severe Protein Calorie Malnutrition  From multiple medical comorbidities  Comfort feedings  Procedures:   Consultations:  Palliative, Renal  Discharge Exam: Filed Vitals:   11/06/12 1515 11/06/12 1709 11/06/12 2027 11/07/12 0414  BP: 94/63 97/67 99/67  96/57  Pulse: 63 96 59 60  Temp: 96.3 F (35.7 C) 97.1 F (36.2 C) 97.6 F (36.4 C) 97.4 F (36.3 C)  TempSrc: Oral Oral Oral Oral  Resp: 18 19 19 18   Height:      Weight:   51.619 kg (113 lb 12.8 oz)   SpO2: 100% 98% 97% 96%    General: Elderly, frail, sitting in chair, weak, but very pleasant Cardiovascular: rrr  Respiratory: no distress Abdomen:  NT to palpation, decreased BS Extremities:  2+ pitting edema  Discharge Instructions      Discharge Orders   Future Orders Complete By Expires     Diet - low sodium heart healthy  As directed     Diet general  As directed     Comments:      Comfort feedings.    Increase activity slowly  As directed     Increase activity slowly  As directed         Medication List    STOP taking these medications       aspirin 81 MG EC tablet     carvedilol 3.125 MG tablet  Commonly known as:  COREG     cinacalcet 30 MG tablet  Commonly known as:  SENSIPAR     HYDROmorphone 2 MG tablet  Commonly known as:  DILAUDID     methadone 5 MG tablet  Commonly  known as:  DOLOPHINE     multivitamin Tabs tablet     pantoprazole 40 MG tablet  Commonly known as:  PROTONIX     pravastatin 20 MG tablet  Commonly known as:  PRAVACHOL     ranolazine 500 MG 12 hr tablet  Commonly known as:  RANEXA     sevelamer 400 MG tablet  Commonly known as:  RENAGEL      TAKE these medications       amiodarone 200 MG tablet  Commonly known as:  PACERONE  Take 1 tablet (200 mg total) by mouth 2 (two) times daily.     DSS 100 MG Caps  Take 100 mg by mouth 2 (two) times daily.     feeding supplement (NEPRO CARB  STEADY) Liqd  Take 237 mLs by mouth 2 (two) times daily with breakfast and lunch.     lidocaine-prilocaine cream  Commonly known as:  EMLA  Apply topically 2 (two) times daily as needed (PAIN/DISCOMFORT).     nitroGLYCERIN 0.4 MG SL tablet  Commonly known as:  NITROSTAT  Place 1 tablet (0.4 mg total) under the tongue every 5 (five) minutes as needed for chest pain.     sodium chloride 0.9 % SOLN 47.5 mL with HYDROmorphone 10 MG/ML SOLN 25 mg  Inject 1 mg/hr into the vein continuous.       Allergies  Allergen Reactions  . Codeine Itching  . Morphine And Related Shortness Of Breath    SOB when given IV once "to me it was mild; I called out fast for help; never had to intubate" (05/23/2012)  . Ace Inhibitors Other (See Comments)    Unknown reaction but her physician stated that she can't take it (specifically lisinopril)  . Tylenol (Acetaminophen) Other (See Comments)    Patient states that the doctor told her she has a Fatty Liver.      The results of significant diagnostics from this hospitalization (including imaging, microbiology, ancillary and laboratory) are listed below for reference.    Significant Diagnostic Studies: Ct Abdomen Pelvis Wo Contrast  11/03/2012   *RADIOLOGY REPORT*  Clinical Data: Abdominal pain.  End-stage renal disease.  CT ABDOMEN AND PELVIS WITHOUT CONTRAST  Technique:  Multidetector CT imaging of the  abdomen and pelvis was performed following the standard protocol without intravenous contrast.  Comparison: CT scan 10/15/2012.  Findings: Stable cirrhotic changes involving the liver with massive abdominal/pelvic ascites, unchanged since recent study.  No obvious small bowel obstruction.  Severe atherosclerotic calcifications involving the abdominal vasculature.  The kidneys demonstrate chronic changes of renal failure with cystic changes, scarring and calcification.  No hydronephrosis.  Diffuse body wall edema is again demonstrated.  Persistent right pleural effusion and right lower lobe infiltrate.  IMPRESSION:  1.  Persistent right pleural effusion and right lower lobe infiltrate. 2.  Cirrhosis and massive abdominal/pelvic ascites. 3.  Severe atherosclerotic calcifications. 4.  Chronic renal disease but no hydronephrosis.   Original Report Authenticated By: Rudie Meyer, M.D.   Ct Abdomen Pelvis W Contrast  10/15/2012   *RADIOLOGY REPORT*  Clinical Data: Lower abdominal pain with nausea and vomiting. Constipation.  Diabetic on dialysis over 1 year.  Prior appendectomy, hysterectomy and cholecystectomy.  CT ABDOMEN AND PELVIS WITH CONTRAST  Technique:  Multidetector CT imaging of the abdomen and pelvis was performed following the standard protocol during bolus administration of intravenous contrast.  Contrast: 80mL OMNIPAQUE IOHEXOL 300 MG/ML  SOLN  Comparison: 08/19/2012.  Findings: AICD is in place.  Marked cardiomegaly.  Diffuse ascites complex with a loculated appearance. In the pelvis, loculated ascites is displacing sigmoid colon posteriorly and small bowel away from the pelvis.  Areas of loculated free air surround the spleen.  This may represent residua of infection previously noted.  Upper left lateral abdominal wall abscess remains measuring 2.5 x 4.4 x 5.5 cm with a loculated septated appearance.  Overall dimensions without significant change.  Abnormal appearance left lobe liver.  This may represent  alterations in vascular supply to the liver rather than primary mass.  Small kidneys with calcifications and cysts.  Atherosclerotic type changes aorta with ectasia.  Atherosclerotic type changes iliac arteries with ectasia and narrowing.  Prominent branch vessel atherosclerotic type changes.  Right pleural effusion.  Right base infiltrate/atelectasis. Minimal  atelectatic changes left base.  No obvious pancreatic abnormality.  Hyperplasia adrenal glands.  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  IMPRESSION: Complex ascites.  In the pelvis this appears loculated and is compressing the sigmoid colon, bladder and displacing small bowel from the pelvis.  Areas of loculated free air surround the spleen.  This may represent residua of infection previously noted.  Upper left lateral wall abscess is septated and has overall dimensions relatively similar to the prior exam.  Marked cardiomegaly.  Right pleural effusion.  Right base infiltrate/atelectasis. Minimal atelectatic changes left base.  Significant atherosclerotic type changes.  Renal osteodystrophy changes of the spine.   Original Report Authenticated By: Lacy Duverney, M.D.   US Abdomen Limited  11/04/2012   LIMITED ULTRASOUND ABDOMEN  Date of procedure 11/03/2012    Clinical data:  Patient with history of end-stage renal disease, congestive heart failure and ascites. Request is made for paracentesis.  Findings:  Limited ultrasound of the abdomen in all four quadrants today reveals only minimal ascites.   There is a small amount of perihepatic and multiloculated ascites noted.  Findings discussed with primary service and decision made to cancel paracentesis amount.  Impression:  Limited ultrasound abdomen all four quadrants reveals minimal ascites, primary perihepatic and multiloculated. Following discussion with ordering physician decision made to cancel paracentesis for now.  Read by: Jeananne Rama, P.A.-C   Original Report Authenticated By: D. Andria Rhein, MD   US Paracentesis  10/17/2012   *RADIOLOGY REPORT*  Clinical Data: Recurrent abdominal ascites  ULTRASOUND GUIDED PARACENTESIS  Comparison:  Previous paracentesis  An ultrasound guided paracentesis was thoroughly discussed with the patient and questions answered.  The benefits, risks, alternatives and complications were also discussed.  The patient understands and wishes to proceed with the procedure.  Written consent was obtained.  Ultrasound was performed to localize and mark an adequate pocket of fluid in the left lower quadrant of the abdomen.  The area was then prepped and draped in the normal sterile fashion.  1% Lidocaine was used for local anesthesia.  Under ultrasound guidance a 19 gauge Yueh catheter was introduced.  Paracentesis was performed.  The catheter was removed and a dressing applied.  Complications:  None  Findings:  A total of approximately 1160 ml of cloudy bloody ascitic fluid was removed.  A fluid sample was sent for laboratory analysis.  IMPRESSION: Successful ultrasound guided paracentesis yielding 1160 ml of ascites.  Read by Brayton El PA-C   Original Report Authenticated By: Irish Lack, M.D.   Dg Chest Port 1 View  11/02/2012   *RADIOLOGY REPORT*  Clinical Data: Swelling in feet and chest pain.  PORTABLE CHEST - 1 VIEW  Comparison: 10/16/2012.  Findings: The heart is enlarged but stable.  The pacer wires / AICD are unchanged.  The right IJ diatek catheter is stable.  The lungs are clear.  No edema or effusions.  IMPRESSION:  1.  Cardiac enlargement, stable. 2.  Stable support apparatus. 3.  No acute pulmonary findings.   Original Report Authenticated By: Rudie Meyer, M.D.   Dg Chest Port 1 View  10/16/2012   *RADIOLOGY REPORT*  Clinical Data: Pulmonary edema post hemodialysis.  PORTABLE CHEST - 1 VIEW  Comparison: 10/15/2012.  Findings: Right central line tip right atrium level.  Skin folds on the right suspected without pneumothorax.  Attention to this possibility  follow-up.  AICD / pacemaker enters from the left.  Leads unchanged in position.  Post CABG.  Cardiomegaly.  Pulmonary edema.  Basilar infiltrate or right not excluded.  IMPRESSION: Cardiomegaly of pulmonary edema.  Right base infiltrate not excluded.  Prominent skin folds on the right suspected.  Please see above.   Original Report Authenticated By: Lacy Duverney, M.D.   Dg Chest Port 1 View  10/15/2012   *RADIOLOGY REPORT*  Clinical Data: Shortness of breath.  History of dialysis and CHF.  PORTABLE CHEST - 1 VIEW  Comparison: 10/08/2012  Findings: Right jugular dialysis catheter tip is in the right atrium.  There is a left cardiac ICD.  Heart remains enlarged for size.  Prominent interstitial densities suggest mild edema or vascular congestion.  Limited evaluation of the right costophrenic angle and cannot exclude a small right pleural effusion.  IMPRESSION: Cardiomegaly with mild interstitial edema or vascular congestion.   Original Report Authenticated By: Richarda Overlie, M.D.   Dg Chest Port 1 View  10/08/2012   *RADIOLOGY REPORT*  Clinical Data: Shortness of breath  PORTABLE CHEST - 1 VIEW  Comparison: 08/19/2012  Findings: Pacing pad are noted over the chest, obscuring detail. Left-sided AICD in place.  Right IJ approach hemodialysis catheter tips over the right atrium.  Moderate enlargement of the cardiomediastinal silhouette again noted with central vascular congestion and hazy perihilar airspace opacity.  Trace right pleural effusion.  Findings are not significantly changed. Evidence of CABG again noted.  IMPRESSION: Stable findings of cardiomegaly with hazy pulmonary opacities and right pleural effusion that could indicate pulmonary edema.   Original Report Authenticated By: Christiana Pellant, M.D.   Dg Abd Portable 2v  11/03/2012   *RADIOLOGY REPORT*  Clinical Data: Abdominal pain, constipation  PORTABLE ABDOMEN - 2 VIEW  Comparison: CT abdomen pelvis dated 11/03/2012  Findings: Nonobstructive bowel gas  pattern.  Moderate colonic stool burden.  Cholecystectomy clips.  Vascular calcifications.  Cardiomegaly.  Postsurgical changes related to prior CABG.  ICD leads, incompletely visualized.  IMPRESSION: Moderate colonic stool burden.   Original Report Authenticated By: Charline Bills, M.D.    Microbiology: Recent Results (from the past 240 hour(s))  CULTURE, BLOOD (ROUTINE X 2)     Status: None   Collection Time    11/03/12 12:00 PM      Result Value Range Status   Specimen Description BLOOD RIGHT HAND   Final   Special Requests BOTTLES DRAWN AEROBIC AND ANAEROBIC 5CC   Final   Culture  Setup Time 11/03/2012 16:24   Final   Culture     Final   Value:        BLOOD CULTURE RECEIVED NO GROWTH TO DATE CULTURE WILL BE HELD FOR 5 DAYS BEFORE ISSUING A FINAL NEGATIVE REPORT   Report Status PENDING   Incomplete  CULTURE, BLOOD (ROUTINE X 2)     Status: None   Collection Time    11/03/12  2:00 PM      Result Value Range Status   Specimen Description BLOOD RIGHT ARM   Final   Special Requests BOTTLES DRAWN AEROBIC AND ANAEROBIC 10CC   Final   Culture  Setup Time 11/03/2012 21:34   Final   Culture     Final   Value:        BLOOD CULTURE RECEIVED NO GROWTH TO DATE CULTURE WILL BE HELD FOR 5 DAYS BEFORE ISSUING A FINAL NEGATIVE REPORT   Report Status PENDING   Incomplete     Labs: Basic Metabolic Panel:  Recent Labs Lab 11/02/12 2249 11/02/12 2313 11/03/12 1200 11/03/12 2355  NA 138 139 137 135  K 4.3 4.4 4.8 4.6  CL 101 105 102 100  CO2 25  --  20 25  GLUCOSE 50* 46* 108* 77  BUN 33* 30* 35* 42*  CREATININE 2.42* 2.60* 2.68* 2.87*  CALCIUM 9.1  --  9.2 9.4  PHOS  --   --   --  4.5   Liver Function Tests:  Recent Labs Lab 11/02/12 2249 11/03/12 1200 11/03/12 2355  AST 24 21  --   ALT 21 20  --   ALKPHOS 188* 170*  --   BILITOT 0.4 0.3  --   PROT 6.4 5.5*  --   ALBUMIN 1.7* 1.4* 1.4*    Recent Labs Lab 11/02/12 2249 11/04/12 0630  LIPASE 7* 5*    Recent Labs Lab  11/03/12 0332  AMMONIA 33   CBC:  Recent Labs Lab 11/02/12 2249 11/02/12 2313 11/03/12 1200 11/03/12 2355  WBC 15.0*  --  12.9* 13.5*  NEUTROABS 13.5*  --   --  12.1*  HGB 11.7* 12.9 10.1* 9.6*  HCT 37.1 38.0 32.3* 30.7*  MCV 89.8  --  89.5 90.3  PLT 163  --  124* 125*   BNP: BNP (last 3 results)  Recent Labs  10/09/12 0330 10/09/12 0955 10/15/12 0926  PROBNP >70000.0* >70000.0* >70000.0*   CBG:  Recent Labs Lab 11/06/12 2246 11/07/12 0003 11/07/12 0202 11/07/12 0417 11/07/12 0535  GLUCAP 55* 46* 67* 57* 34*    Signed:  Jeoffrey Massed, PA-C  [facility] 11/07/2012, 3:37 PM   Attending Patient seen and examined, agree with the assessment and plan as outlined above. Unfortunately [name] has deteriorated much more today compared to yesterday. I have seen her twice today, she has been requesting to go home today since the past few days, yesterday one of her relatives wanted to take her home, unfortunately hospice had not delivered the equipments as yet. Yesterday I spoke with the patient and the daughter-who was on her way here from [place] and wanted to keep the patient till she got here, So the patient ended up staying overnight.  Today-palliative care has recommended Dilaudid drip for comfort, her daughter now is at bedside, and I have explained to her that the patient is clearly deteriorating and dying. I have been for the past 3-4 days, talking to her on the phone and have made it very clear that patient herself has chosen to stop HD and de-activate her AICD. Family is obviously distraught and are having a hard time coping with this. We have arranged transportation, Dilaudid infusion etc to go with the patient home today. Discharge home for end of life care with home hospice  S Darnisha Vernet

## 2012-11-07 NOTE — Progress Notes (Signed)
Follow-up visit with pt as per spiritual director's recommendation. I visited with the pt and their family with the chaplain. Pt was with her daughter and older sister. Pt was in a state of semi-consciousness, and was responsive when spoken to at times. I introduced myself and my role to the patient's family members. Pt's family was experiencing feelings of grief and loss from news that the pt was not going to have much longer to live. Pt's family discussed plans for the pt when they returned home later today, and shared other times when the pt had survived near death ordeals. Just before I left the pt and their family, I provided my professional contact information to the pt's family in the event that they need me during the rest of their time in the hospital.   Sherol Dade Counselor Intern Haroldine Laws

## 2012-11-07 NOTE — Progress Notes (Signed)
Patient will probably need Dilaudid infusion at home if she survives long enough to get there-she is requiring frequent PRN dosing. Recommend ordering Dialudid 1mg /hr infusion continuous-Hospice can adjust and set up PCA/CADD pump for home-pharmacy will need script to prepare gtt.   Anderson Malta, DO Palliative Medicine

## 2012-11-07 NOTE — Progress Notes (Signed)
Patient removed IV site x 2, refuse restart at this time, explained to patient i will be  Unable to give her IV pain medication.

## 2012-11-07 NOTE — Progress Notes (Signed)
Discussed d/c plan and instructions with the patient's daughter and her sister, lots of emotional support given throughout the day to both the family and the patient.  The daughter expressed understanding of the instructions and that the hospice nurse will be there tonight to assist with getting the patient settled.  The daughter stated she was very thankful for all the help to get Zandrea home, and home comfortably.

## 2012-11-07 NOTE — Progress Notes (Addendum)
Spoke with pt concerning her blood sugar, 36, being so low and continuing to treat with IM glucagon. Pt refuses to drink any type of  juice, glucose gel, glucose tablets, or IM glucagon. IV discontinued per pt request on 11/06/12 and refuses IV restart. Spoke with Dr Ladona Ridgel concerning these issues and it is agreed that this is within pts goals of care. Pt wishes to be discharged home as soon as possible.

## 2012-11-07 NOTE — Progress Notes (Signed)
Patient refuse to eat or drink juice, blood sugar 39. Refuses any treatment for blood sugar at this time.

## 2012-11-07 NOTE — Care Management Note (Signed)
   CARE MANAGEMENT NOTE 11/07/2012  Patient:  [name], [name]   Account Number:  [id number]  Date Initiated:  11/05/2012  Documentation initiated by:  Vance Peper  Subjective/Objective Assessment:   62 yr old female, ESRD, admitted with SOB. Patient is terminal and has requested home with hospice care today if at all possible.     Action/Plan:   CM spoke with patient concerning discharge plans. Call placed to [facility].   Anticipated DC Date:  11/07/2012   Anticipated DC Plan:  HOME W HOSPICE CARE      DC Planning Services  CM consult      PAC Choice  HOSPICE   Choice offered to / List presented to:  C-1 Patient        HH arranged  HH-1 RN  HH-4 NURSE'S AIDE      HH agency  [facility]   Status of service:  Completed, signed off Medicare Important Message given?   (If response is "NO", the following Medicare IM given date fields will be blank) Date Medicare IM given:   Date Additional Medicare IM given:    Discharge Disposition:  HOME W HOSPICE CARE  Per UR Regulation:    If discussed at Long Length of Stay Meetings, dates discussed:    Comments:  11/07/2012 Orders for Dilaudid drip for home to be administered via hemodialysis catheter. Hospice on call RN notifed, and [facility] notifed. They were able to arrange Dilaudid drip and [facility] will initiate in the hospital, charge RN will notify ambulance when drip is started and d/c to home. Pt daughter has arrived and will go home with pt. [facility] will admit pt for Hospice services this pm after d/c. Johny Shock RN MPH [phone]  11/05/12  1600   Vance Peper, RN BSN NCM CM spoke with Hospice weekend RN.Faxed information to hospice referral. MD will review and speak with patient.

## 2012-11-07 NOTE — Progress Notes (Signed)
Patient upset and crying, chaplain called and in room to see patient.

## 2012-11-09 LAB — CULTURE, BLOOD (ROUTINE X 2): Culture: NO GROWTH

## 2012-11-13 DEATH — deceased

## 2012-12-30 ENCOUNTER — Telehealth: Payer: Self-pay | Admitting: *Deleted

## 2012-12-30 NOTE — Telephone Encounter (Signed)
Faxed PT order back.

## 2013-02-22 ENCOUNTER — Encounter: Payer: Self-pay | Admitting: *Deleted

## 2013-05-17 ENCOUNTER — Telehealth: Payer: Self-pay | Admitting: Cardiovascular Disease

## 2013-05-17 NOTE — Telephone Encounter (Signed)
Called pt unable to leave msg ... Voicemail not set up .[name] Mailed letter

## 2013-09-16 IMAGING — CR DG UGI W/ SMALL BOWEL
2 series · 2 of 2 positions shown · non-contrast
Comparison: CT scan of the abdomen dated 09/13/2011

CLINICAL DATA: Abdominal pain.  Chronic pancreatitis.  Hemorrhagic
pseudocyst.

UPPER GI W/ SMALL BOWEL
TECHNIQUE: Upper GI series performed with high density barium and
effervescent agent. Thin barium also used.  Subsequently, serial
images of the small bowel were obtained including spot views of the
terminal ileum.
Fluoroscopy Time: 5.37 minutes slow pulsed reduced dose fluoroscopy

[view not recorded (1 of 2)]
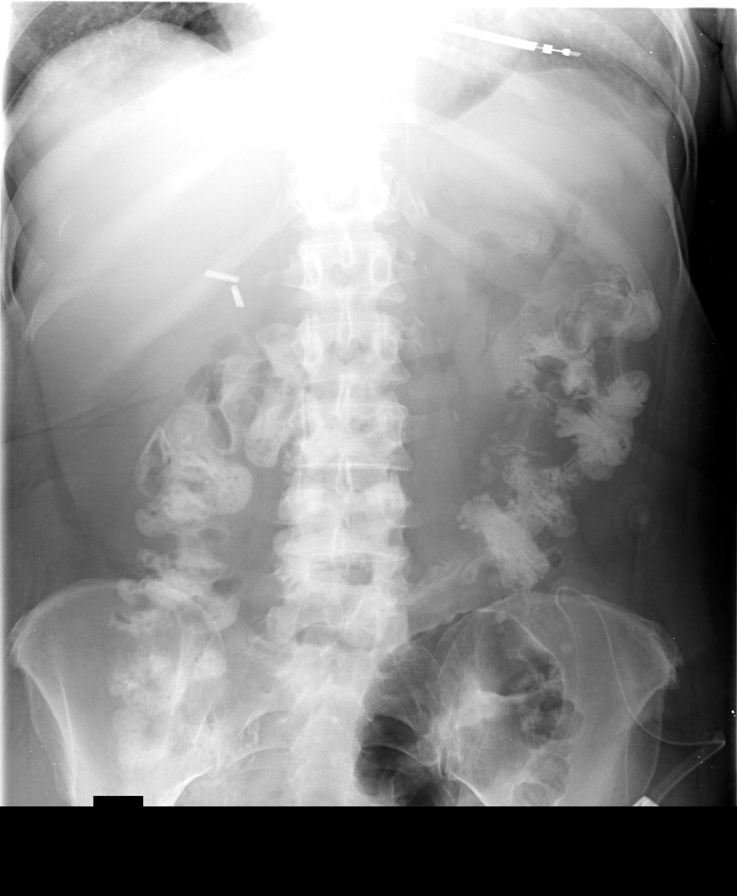

[view not recorded (2 of 2)]
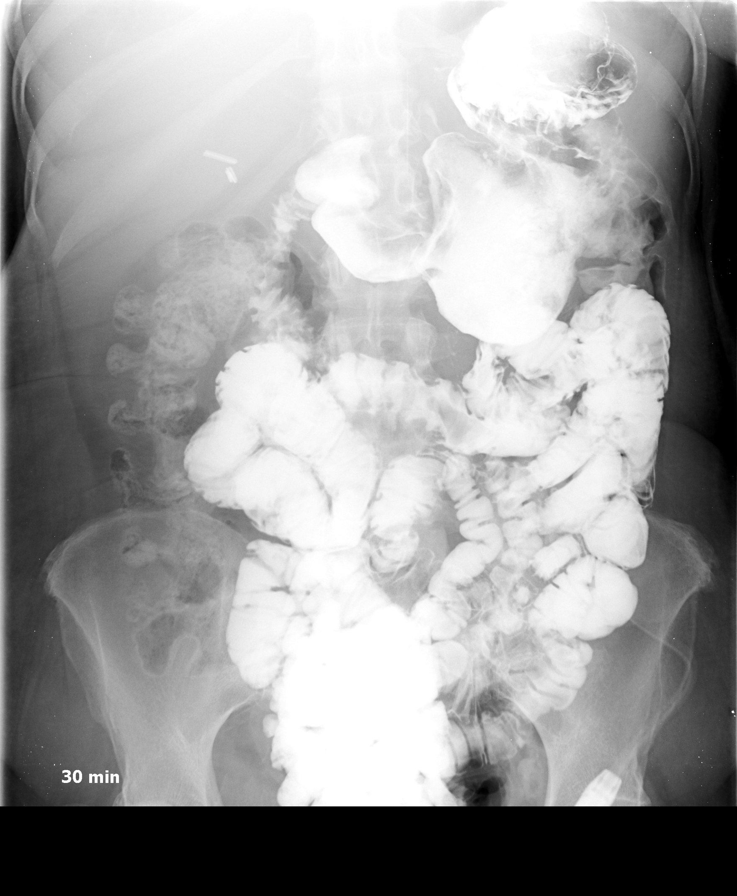

[2 of 2 positions shown; findings below may reference images not displayed]

FINDINGS: The mucosa and motility of the esophagus are normal.  No
hiatal hernia or gastroesophageal reflux.  The patient has edema of
the mucosa of the body of the stomach.

There is a 4 cm indentation upon the lesser curvature of the
gastric antrum but the previously demonstrated hemorrhagic
pseudocyst.  However, there is free flow of barium around the
pseudocyst mass effect into the distal antrum and through the
pylorus into the duodenal C-loop.  Small bowel transit time was
approximately 2 hours and 10 minutes. The small bowel is normal
including the terminal ileum.  The appendix fills and is normal.
IMPRESSION: The patient has an extrinsic mass effect upon the antrum of the
stomach due to the hemorrhagic pseudocyst demonstrated on the prior
CT scan.  However, there is no obstruction of flow of barium into
the duodenum and small bowel.  Small bowel is normal.

There is edema of the mucosa of the stomach.  This could be due to
hyperacidity.

## 2013-09-18 ENCOUNTER — Encounter: Payer: Self-pay | Admitting: *Deleted

## 2013-10-11 ENCOUNTER — Telehealth: Payer: Self-pay | Admitting: Cardiovascular Disease

## 2013-10-11 NOTE — Telephone Encounter (Signed)
Sent letter to set up appt for defib ck with dr c, or has seen allred but would be re-est as it has been since 2012/mt

## 2014-03-09 ENCOUNTER — Encounter: Payer: Self-pay | Admitting: *Deleted

## 2014-05-24 ENCOUNTER — Encounter (HOSPITAL_COMMUNITY): Payer: Self-pay | Admitting: Cardiovascular Disease
